# Patient Record
Sex: Female | Born: 1945 | Marital: Married | Smoking: Current every day smoker
Health system: Southern US, Community
[De-identification: ages and names within clinical notes are randomized; demographics above are authoritative.]

## PROBLEM LIST (undated history)

## (undated) ENCOUNTER — Emergency Department (HOSPITAL_COMMUNITY): Disposition: A | Discharge status: Home / Self Care | Payer: Medicare Other

## (undated) DIAGNOSIS — K635 Polyp of colon: Secondary | ICD-10-CM

## (undated) DIAGNOSIS — I739 Peripheral vascular disease, unspecified: Secondary | ICD-10-CM

## (undated) DIAGNOSIS — T7840XA Allergy, unspecified, initial encounter: Secondary | ICD-10-CM

## (undated) DIAGNOSIS — R198 Other specified symptoms and signs involving the digestive system and abdomen: Secondary | ICD-10-CM

## (undated) DIAGNOSIS — K449 Diaphragmatic hernia without obstruction or gangrene: Secondary | ICD-10-CM

## (undated) DIAGNOSIS — I809 Phlebitis and thrombophlebitis of unspecified site: Secondary | ICD-10-CM

## (undated) DIAGNOSIS — J439 Emphysema, unspecified: Secondary | ICD-10-CM

## (undated) DIAGNOSIS — N189 Chronic kidney disease, unspecified: Secondary | ICD-10-CM

## (undated) DIAGNOSIS — K219 Gastro-esophageal reflux disease without esophagitis: Secondary | ICD-10-CM

## (undated) DIAGNOSIS — G629 Polyneuropathy, unspecified: Secondary | ICD-10-CM

## (undated) DIAGNOSIS — M1990 Unspecified osteoarthritis, unspecified site: Secondary | ICD-10-CM

## (undated) DIAGNOSIS — N184 Chronic kidney disease, stage 4 (severe): Secondary | ICD-10-CM

## (undated) DIAGNOSIS — H409 Unspecified glaucoma: Secondary | ICD-10-CM

## (undated) DIAGNOSIS — F32A Depression, unspecified: Secondary | ICD-10-CM

## (undated) DIAGNOSIS — K222 Esophageal obstruction: Secondary | ICD-10-CM

## (undated) DIAGNOSIS — K5790 Diverticulosis of intestine, part unspecified, without perforation or abscess without bleeding: Secondary | ICD-10-CM

## (undated) DIAGNOSIS — I729 Aneurysm of unspecified site: Secondary | ICD-10-CM

## (undated) DIAGNOSIS — I639 Cerebral infarction, unspecified: Secondary | ICD-10-CM

## (undated) DIAGNOSIS — E785 Hyperlipidemia, unspecified: Secondary | ICD-10-CM

## (undated) DIAGNOSIS — Z8489 Family history of other specified conditions: Secondary | ICD-10-CM

## (undated) DIAGNOSIS — F419 Anxiety disorder, unspecified: Secondary | ICD-10-CM

## (undated) DIAGNOSIS — I10 Essential (primary) hypertension: Secondary | ICD-10-CM

## (undated) DIAGNOSIS — G43909 Migraine, unspecified, not intractable, without status migrainosus: Secondary | ICD-10-CM

## (undated) DIAGNOSIS — H269 Unspecified cataract: Secondary | ICD-10-CM

## (undated) DIAGNOSIS — N185 Chronic kidney disease, stage 5: Secondary | ICD-10-CM

## (undated) DIAGNOSIS — K649 Unspecified hemorrhoids: Secondary | ICD-10-CM

## (undated) HISTORY — DX: Cerebral infarction, unspecified: I63.9

## (undated) HISTORY — PX: BREAST SURGERY: SHX581

## (undated) HISTORY — DX: Allergy, unspecified, initial encounter: T78.40XA

## (undated) HISTORY — DX: Hyperlipidemia, unspecified: E78.5

## (undated) HISTORY — DX: Esophageal obstruction: K22.2

## (undated) HISTORY — PX: CHOLECYSTECTOMY: SHX55

## (undated) HISTORY — DX: Unspecified glaucoma: H40.9

## (undated) HISTORY — DX: Diverticulosis of intestine, part unspecified, without perforation or abscess without bleeding: K57.90

## (undated) HISTORY — PX: HERNIA REPAIR: SHX51

## (undated) HISTORY — DX: Phlebitis and thrombophlebitis of unspecified site: I80.9

## (undated) HISTORY — DX: Chronic kidney disease, stage 4 (severe): N18.4

## (undated) HISTORY — DX: Gastro-esophageal reflux disease without esophagitis: K21.9

## (undated) HISTORY — DX: Unspecified hemorrhoids: K64.9

## (undated) HISTORY — DX: Peripheral vascular disease, unspecified: I73.9

## (undated) HISTORY — PX: OTHER SURGICAL HISTORY: SHX169

## (undated) HISTORY — DX: Unspecified osteoarthritis, unspecified site: M19.90

## (undated) HISTORY — DX: Diaphragmatic hernia without obstruction or gangrene: K44.9

## (undated) HISTORY — DX: Unspecified cataract: H26.9

## (undated) HISTORY — PX: KNEE SURGERY: SHX244

## (undated) HISTORY — DX: Polyp of colon: K63.5

## (undated) HISTORY — DX: Essential (primary) hypertension: I10

## (undated) HISTORY — DX: Chronic kidney disease, unspecified: N18.9

## (undated) HISTORY — DX: Aneurysm of unspecified site: I72.9

## (undated) HISTORY — DX: Other specified symptoms and signs involving the digestive system and abdomen: R19.8

## (undated) HISTORY — DX: Migraine, unspecified, not intractable, without status migrainosus: G43.909

## (undated) HISTORY — DX: Depression, unspecified: F32.A

## (undated) HISTORY — PX: THYROID SURGERY: SHX805

## (undated) HISTORY — DX: Emphysema, unspecified: J43.9

## (undated) HISTORY — DX: Anxiety disorder, unspecified: F41.9

---

## 1982-09-20 HISTORY — PX: ABDOMINAL HYSTERECTOMY: SHX81

## 1998-09-20 DIAGNOSIS — I10 Essential (primary) hypertension: Secondary | ICD-10-CM

## 1998-09-20 HISTORY — DX: Essential (primary) hypertension: I10

## 1998-12-02 ENCOUNTER — Ambulatory Visit (HOSPITAL_COMMUNITY): Admission: RE | Admit: 1998-12-02 | Discharge: 1998-12-02 | Payer: Self-pay | Admitting: Family Medicine

## 2000-01-22 ENCOUNTER — Encounter: Admission: RE | Admit: 2000-01-22 | Discharge: 2000-01-22 | Payer: Self-pay | Admitting: Family Medicine

## 2000-01-22 ENCOUNTER — Encounter: Payer: Self-pay | Admitting: Family Medicine

## 2000-01-26 ENCOUNTER — Encounter: Admission: RE | Admit: 2000-01-26 | Discharge: 2000-01-26 | Payer: Self-pay | Admitting: Family Medicine

## 2000-01-26 ENCOUNTER — Encounter: Payer: Self-pay | Admitting: Family Medicine

## 2000-09-09 ENCOUNTER — Encounter: Payer: Self-pay | Admitting: Orthopedic Surgery

## 2000-09-09 ENCOUNTER — Encounter: Admission: RE | Admit: 2000-09-09 | Discharge: 2000-09-09 | Payer: Self-pay | Admitting: Orthopedic Surgery

## 2000-10-11 ENCOUNTER — Encounter: Admission: RE | Admit: 2000-10-11 | Discharge: 2001-01-09 | Payer: Self-pay | Admitting: Orthopedic Surgery

## 2001-01-12 ENCOUNTER — Encounter: Admission: RE | Admit: 2001-01-12 | Discharge: 2001-01-12 | Payer: Self-pay | Admitting: Orthopedic Surgery

## 2001-09-20 DIAGNOSIS — I729 Aneurysm of unspecified site: Secondary | ICD-10-CM

## 2001-09-20 HISTORY — DX: Aneurysm of unspecified site: I72.9

## 2002-08-09 ENCOUNTER — Encounter (HOSPITAL_BASED_OUTPATIENT_CLINIC_OR_DEPARTMENT_OTHER): Payer: Self-pay | Admitting: General Surgery

## 2002-08-10 ENCOUNTER — Ambulatory Visit (HOSPITAL_COMMUNITY): Admission: RE | Admit: 2002-08-10 | Discharge: 2002-08-10 | Payer: Self-pay | Admitting: General Surgery

## 2002-08-10 ENCOUNTER — Encounter (INDEPENDENT_AMBULATORY_CARE_PROVIDER_SITE_OTHER): Payer: Self-pay | Admitting: Specialist

## 2002-08-10 ENCOUNTER — Encounter (INDEPENDENT_AMBULATORY_CARE_PROVIDER_SITE_OTHER): Payer: Self-pay | Admitting: *Deleted

## 2003-07-30 ENCOUNTER — Encounter: Payer: Self-pay | Admitting: Internal Medicine

## 2003-08-19 ENCOUNTER — Encounter: Payer: Self-pay | Admitting: Internal Medicine

## 2003-10-23 ENCOUNTER — Emergency Department (HOSPITAL_COMMUNITY): Admission: EM | Admit: 2003-10-23 | Discharge: 2003-10-23 | Payer: Self-pay | Admitting: Emergency Medicine

## 2004-01-19 ENCOUNTER — Encounter: Admission: RE | Admit: 2004-01-19 | Discharge: 2004-01-19 | Payer: Self-pay | Admitting: Orthopedic Surgery

## 2004-01-23 ENCOUNTER — Encounter: Admission: RE | Admit: 2004-01-23 | Discharge: 2004-01-23 | Payer: Self-pay | Admitting: Endocrinology

## 2004-01-28 ENCOUNTER — Encounter: Admission: RE | Admit: 2004-01-28 | Discharge: 2004-01-28 | Payer: Self-pay | Admitting: Endocrinology

## 2004-03-04 ENCOUNTER — Ambulatory Visit (HOSPITAL_COMMUNITY): Admission: RE | Admit: 2004-03-04 | Discharge: 2004-03-04 | Payer: Self-pay | Admitting: Neurology

## 2004-04-20 ENCOUNTER — Ambulatory Visit (HOSPITAL_COMMUNITY): Admission: RE | Admit: 2004-04-20 | Discharge: 2004-04-20 | Payer: Self-pay | Admitting: Endocrinology

## 2005-04-01 ENCOUNTER — Ambulatory Visit: Payer: Self-pay | Admitting: Endocrinology

## 2005-04-07 ENCOUNTER — Ambulatory Visit: Payer: Self-pay | Admitting: Endocrinology

## 2005-07-01 ENCOUNTER — Ambulatory Visit: Payer: Self-pay | Admitting: Endocrinology

## 2005-07-07 ENCOUNTER — Ambulatory Visit: Payer: Self-pay | Admitting: Endocrinology

## 2005-07-21 ENCOUNTER — Ambulatory Visit: Payer: Self-pay | Admitting: Endocrinology

## 2005-09-21 LAB — CONVERTED CEMR LAB: Pap Smear: NORMAL

## 2005-11-29 ENCOUNTER — Ambulatory Visit: Payer: Self-pay | Admitting: Endocrinology

## 2006-04-01 ENCOUNTER — Ambulatory Visit: Payer: Self-pay | Admitting: Endocrinology

## 2006-04-07 ENCOUNTER — Ambulatory Visit: Payer: Self-pay | Admitting: Endocrinology

## 2006-09-19 ENCOUNTER — Emergency Department (HOSPITAL_COMMUNITY): Admission: EM | Admit: 2006-09-19 | Discharge: 2006-09-19 | Payer: Self-pay | Admitting: Emergency Medicine

## 2006-09-21 ENCOUNTER — Ambulatory Visit: Payer: Self-pay | Admitting: Endocrinology

## 2006-10-26 ENCOUNTER — Ambulatory Visit: Payer: Self-pay | Admitting: Endocrinology

## 2006-10-26 LAB — CONVERTED CEMR LAB
Albumin: 3.6 g/dL (ref 3.5–5.2)
Alkaline Phosphatase: 53 units/L (ref 39–117)
Hgb A1c MFr Bld: 8.4 % — ABNORMAL HIGH (ref 4.6–6.0)
Total CHOL/HDL Ratio: 2.9
Triglycerides: 94 mg/dL (ref 0–149)
VLDL: 19 mg/dL (ref 0–40)

## 2006-10-28 ENCOUNTER — Ambulatory Visit: Payer: Self-pay

## 2006-11-01 ENCOUNTER — Ambulatory Visit: Payer: Self-pay | Admitting: Endocrinology

## 2006-11-05 ENCOUNTER — Ambulatory Visit: Payer: Self-pay | Admitting: Family Medicine

## 2006-11-11 ENCOUNTER — Encounter: Admission: RE | Admit: 2006-11-11 | Discharge: 2006-11-11 | Payer: Self-pay | Admitting: Endocrinology

## 2006-11-30 ENCOUNTER — Ambulatory Visit: Payer: Self-pay | Admitting: Endocrinology

## 2006-11-30 LAB — CONVERTED CEMR LAB: TSH: 1.21 microintl units/mL (ref 0.35–5.50)

## 2006-12-21 ENCOUNTER — Ambulatory Visit: Payer: Self-pay | Admitting: Endocrinology

## 2006-12-23 ENCOUNTER — Encounter: Admission: RE | Admit: 2006-12-23 | Discharge: 2006-12-23 | Payer: Self-pay | Admitting: Obstetrics and Gynecology

## 2007-01-13 ENCOUNTER — Encounter (INDEPENDENT_AMBULATORY_CARE_PROVIDER_SITE_OTHER): Payer: Self-pay | Admitting: *Deleted

## 2007-01-13 ENCOUNTER — Encounter (INDEPENDENT_AMBULATORY_CARE_PROVIDER_SITE_OTHER): Payer: Self-pay | Admitting: Specialist

## 2007-01-13 ENCOUNTER — Inpatient Hospital Stay (HOSPITAL_COMMUNITY): Admission: RE | Admit: 2007-01-13 | Discharge: 2007-01-16 | Payer: Self-pay | Admitting: Obstetrics and Gynecology

## 2007-03-30 ENCOUNTER — Ambulatory Visit: Payer: Self-pay | Admitting: Endocrinology

## 2007-04-04 ENCOUNTER — Ambulatory Visit: Payer: Self-pay | Admitting: Endocrinology

## 2007-04-19 ENCOUNTER — Encounter: Admission: RE | Admit: 2007-04-19 | Discharge: 2007-06-20 | Payer: Self-pay | Admitting: Endocrinology

## 2007-05-01 ENCOUNTER — Encounter: Payer: Self-pay | Admitting: Endocrinology

## 2007-05-01 DIAGNOSIS — E1122 Type 2 diabetes mellitus with diabetic chronic kidney disease: Secondary | ICD-10-CM | POA: Insufficient documentation

## 2007-05-01 DIAGNOSIS — I10 Essential (primary) hypertension: Secondary | ICD-10-CM | POA: Insufficient documentation

## 2007-05-01 DIAGNOSIS — N183 Chronic kidney disease, stage 3 (moderate): Secondary | ICD-10-CM

## 2007-05-02 ENCOUNTER — Ambulatory Visit: Payer: Self-pay | Admitting: Endocrinology

## 2007-05-16 ENCOUNTER — Ambulatory Visit: Payer: Self-pay | Admitting: Endocrinology

## 2007-07-27 ENCOUNTER — Ambulatory Visit: Payer: Self-pay | Admitting: Endocrinology

## 2007-07-27 LAB — CONVERTED CEMR LAB: Hgb A1c MFr Bld: 8.3 % — ABNORMAL HIGH (ref 4.6–6.0)

## 2007-08-02 ENCOUNTER — Ambulatory Visit: Payer: Self-pay | Admitting: Endocrinology

## 2007-08-02 ENCOUNTER — Telehealth (INDEPENDENT_AMBULATORY_CARE_PROVIDER_SITE_OTHER): Payer: Self-pay | Admitting: *Deleted

## 2007-09-05 ENCOUNTER — Encounter: Payer: Self-pay | Admitting: Endocrinology

## 2007-09-18 ENCOUNTER — Ambulatory Visit: Payer: Self-pay | Admitting: Endocrinology

## 2007-10-16 ENCOUNTER — Telehealth (INDEPENDENT_AMBULATORY_CARE_PROVIDER_SITE_OTHER): Payer: Self-pay | Admitting: *Deleted

## 2007-10-17 ENCOUNTER — Ambulatory Visit: Payer: Self-pay | Admitting: Internal Medicine

## 2007-11-17 ENCOUNTER — Ambulatory Visit: Payer: Self-pay | Admitting: Endocrinology

## 2007-11-17 ENCOUNTER — Telehealth: Payer: Self-pay | Admitting: Endocrinology

## 2008-01-04 ENCOUNTER — Ambulatory Visit: Payer: Self-pay | Admitting: Endocrinology

## 2008-01-04 LAB — CONVERTED CEMR LAB
BUN: 17 mg/dL (ref 6–23)
CO2: 32 meq/L (ref 19–32)
Calcium: 9.5 mg/dL (ref 8.4–10.5)
Chloride: 103 meq/L (ref 96–112)
Cholesterol: 191 mg/dL (ref 0–200)
Creatinine, Ser: 1.1 mg/dL (ref 0.4–1.2)
Glucose, Bld: 286 mg/dL — ABNORMAL HIGH (ref 70–99)
HDL: 32.2 mg/dL — ABNORMAL LOW (ref >39.0)
Hgb A1c MFr Bld: 9.4 % — ABNORMAL HIGH (ref 4.6–6.0)
Triglycerides: 140 mg/dL (ref 0–149)

## 2008-01-09 ENCOUNTER — Ambulatory Visit: Payer: Self-pay | Admitting: Endocrinology

## 2008-01-15 ENCOUNTER — Encounter: Admission: RE | Admit: 2008-01-15 | Discharge: 2008-01-15 | Payer: Self-pay | Admitting: Neurology

## 2008-01-24 ENCOUNTER — Ambulatory Visit (HOSPITAL_BASED_OUTPATIENT_CLINIC_OR_DEPARTMENT_OTHER): Admission: RE | Admit: 2008-01-24 | Discharge: 2008-01-24 | Payer: Self-pay | Admitting: Neurology

## 2008-01-27 ENCOUNTER — Ambulatory Visit: Payer: Self-pay | Admitting: Internal Medicine

## 2008-02-07 ENCOUNTER — Ambulatory Visit: Payer: Self-pay | Admitting: Endocrinology

## 2008-03-07 ENCOUNTER — Encounter: Payer: Self-pay | Admitting: Endocrinology

## 2008-03-11 ENCOUNTER — Ambulatory Visit: Payer: Self-pay | Admitting: Endocrinology

## 2008-03-11 DIAGNOSIS — H409 Unspecified glaucoma: Secondary | ICD-10-CM

## 2008-03-11 DIAGNOSIS — I671 Cerebral aneurysm, nonruptured: Secondary | ICD-10-CM | POA: Insufficient documentation

## 2008-03-11 DIAGNOSIS — E1139 Type 2 diabetes mellitus with other diabetic ophthalmic complication: Secondary | ICD-10-CM | POA: Insufficient documentation

## 2008-03-11 DIAGNOSIS — E042 Nontoxic multinodular goiter: Secondary | ICD-10-CM | POA: Insufficient documentation

## 2008-03-15 ENCOUNTER — Encounter: Payer: Self-pay | Admitting: Endocrinology

## 2008-05-06 ENCOUNTER — Telehealth (INDEPENDENT_AMBULATORY_CARE_PROVIDER_SITE_OTHER): Payer: Self-pay | Admitting: *Deleted

## 2008-06-21 ENCOUNTER — Telehealth: Payer: Self-pay | Admitting: Internal Medicine

## 2008-07-03 ENCOUNTER — Ambulatory Visit: Payer: Self-pay | Admitting: Endocrinology

## 2008-07-03 LAB — CONVERTED CEMR LAB
Alkaline Phosphatase: 69 units/L (ref 39–117)
Bilirubin Urine: NEGATIVE
Bilirubin, Direct: 0.1 mg/dL (ref 0.0–0.3)
Eosinophils Absolute: 0.2 10*3/uL (ref 0.0–0.7)
GFR calc Af Amer: 65 mL/min
GFR calc non Af Amer: 54 mL/min
Glucose, Bld: 206 mg/dL — ABNORMAL HIGH (ref 70–99)
HCT: 40.5 % (ref 36.0–46.0)
HDL: 32.3 mg/dL — ABNORMAL LOW (ref >39.0)
Hemoglobin, Urine: NEGATIVE
Hgb A1c MFr Bld: 8.1 % — ABNORMAL HIGH (ref 4.6–6.0)
LDL Cholesterol: 129 mg/dL — ABNORMAL HIGH (ref 0–99)
Microalb Creat Ratio: 8.8 mg/g (ref 0.0–30.0)
Monocytes Absolute: 0.5 10*3/uL (ref 0.1–1.0)
Monocytes Relative: 5.3 % (ref 3.0–12.0)
Neutrophils Relative %: 66.6 % (ref 43.0–77.0)
Nitrite: NEGATIVE
Platelets: 310 10*3/uL (ref 150–400)
Potassium: 4 meq/L (ref 3.5–5.1)
RDW: 11.7 % (ref 11.5–14.6)
Sodium: 141 meq/L (ref 135–145)
TSH: 1.72 microintl units/mL (ref 0.35–5.50)
Total CHOL/HDL Ratio: 5.8
Total Protein, Urine: NEGATIVE mg/dL
Urobilinogen, UA: 0.2 (ref 0.0–1.0)
VLDL: 27 mg/dL (ref 0–40)

## 2008-07-08 ENCOUNTER — Ambulatory Visit: Payer: Self-pay | Admitting: Endocrinology

## 2008-08-22 ENCOUNTER — Encounter: Payer: Self-pay | Admitting: Endocrinology

## 2008-08-23 ENCOUNTER — Telehealth (INDEPENDENT_AMBULATORY_CARE_PROVIDER_SITE_OTHER): Payer: Self-pay | Admitting: *Deleted

## 2008-08-26 ENCOUNTER — Encounter: Admission: RE | Admit: 2008-08-26 | Discharge: 2008-08-26 | Payer: Self-pay | Admitting: Surgery

## 2008-08-27 ENCOUNTER — Encounter: Payer: Self-pay | Admitting: Internal Medicine

## 2008-08-27 DIAGNOSIS — K222 Esophageal obstruction: Secondary | ICD-10-CM | POA: Insufficient documentation

## 2008-08-27 DIAGNOSIS — K219 Gastro-esophageal reflux disease without esophagitis: Secondary | ICD-10-CM | POA: Insufficient documentation

## 2008-08-27 HISTORY — DX: Esophageal obstruction: K22.2

## 2008-09-09 ENCOUNTER — Ambulatory Visit (HOSPITAL_COMMUNITY): Admission: RE | Admit: 2008-09-09 | Discharge: 2008-09-09 | Payer: Self-pay | Admitting: Internal Medicine

## 2008-09-27 ENCOUNTER — Encounter: Payer: Self-pay | Admitting: Endocrinology

## 2008-10-14 ENCOUNTER — Ambulatory Visit: Payer: Self-pay | Admitting: Endocrinology

## 2008-10-14 LAB — CONVERTED CEMR LAB
AST: 25 units/L (ref 0–37)
Albumin: 3.5 g/dL (ref 3.5–5.2)
Alkaline Phosphatase: 84 units/L (ref 39–117)
Cholesterol: 197 mg/dL (ref 0–200)
Total Protein: 7.4 g/dL (ref 6.0–8.3)
Triglycerides: 184 mg/dL — ABNORMAL HIGH (ref 0–149)

## 2008-11-08 ENCOUNTER — Ambulatory Visit (HOSPITAL_COMMUNITY): Admission: RE | Admit: 2008-11-08 | Discharge: 2008-11-09 | Payer: Self-pay | Admitting: Surgery

## 2008-11-28 ENCOUNTER — Encounter: Payer: Self-pay | Admitting: Endocrinology

## 2008-11-28 ENCOUNTER — Telehealth (INDEPENDENT_AMBULATORY_CARE_PROVIDER_SITE_OTHER): Payer: Self-pay | Admitting: *Deleted

## 2008-12-27 ENCOUNTER — Ambulatory Visit: Payer: Self-pay | Admitting: Endocrinology

## 2008-12-28 LAB — CONVERTED CEMR LAB
ALT: 16 units/L (ref 0–35)
AST: 20 units/L (ref 0–37)
Albumin: 3.5 g/dL (ref 3.5–5.2)
Alkaline Phosphatase: 88 units/L (ref 39–117)
BUN: 16 mg/dL (ref 6–23)
Basophils Relative: 0.4 % (ref 0.0–3.0)
Bilirubin Urine: NEGATIVE
Chloride: 103 meq/L (ref 96–112)
Cholesterol: 146 mg/dL (ref 0–200)
Creatinine, Ser: 1 mg/dL (ref 0.4–1.2)
Eosinophils Absolute: 0.2 10*3/uL (ref 0.0–0.7)
GFR calc non Af Amer: 72.21 mL/min (ref >60)
Hemoglobin, Urine: NEGATIVE
Hemoglobin: 14.1 g/dL (ref 12.0–15.0)
Ketones, ur: NEGATIVE mg/dL
LDL Cholesterol: 93 mg/dL (ref 0–99)
Lymphocytes Relative: 28.7 % (ref 12.0–46.0)
MCHC: 33.9 g/dL (ref 30.0–36.0)
Neutro Abs: 5.5 10*3/uL (ref 1.4–7.7)
RBC: 4.35 M/uL (ref 3.87–5.11)
Total Protein: 7 g/dL (ref 6.0–8.3)
Urine Glucose: NEGATIVE mg/dL
Urobilinogen, UA: 0.2 (ref 0.0–1.0)

## 2008-12-31 ENCOUNTER — Ambulatory Visit: Payer: Self-pay | Admitting: Endocrinology

## 2009-01-09 ENCOUNTER — Encounter: Payer: Self-pay | Admitting: Endocrinology

## 2009-01-09 ENCOUNTER — Ambulatory Visit: Payer: Self-pay | Admitting: Internal Medicine

## 2009-01-20 ENCOUNTER — Encounter: Admission: RE | Admit: 2009-01-20 | Discharge: 2009-01-20 | Payer: Self-pay | Admitting: Neurology

## 2009-02-13 ENCOUNTER — Encounter: Admission: RE | Admit: 2009-02-13 | Discharge: 2009-02-13 | Payer: Self-pay | Admitting: Neurology

## 2009-02-18 ENCOUNTER — Encounter: Payer: Self-pay | Admitting: Endocrinology

## 2009-02-19 ENCOUNTER — Ambulatory Visit: Payer: Self-pay | Admitting: Cardiology

## 2009-02-19 ENCOUNTER — Encounter (INDEPENDENT_AMBULATORY_CARE_PROVIDER_SITE_OTHER): Payer: Self-pay | Admitting: Neurology

## 2009-02-19 ENCOUNTER — Ambulatory Visit (HOSPITAL_COMMUNITY): Admission: RE | Admit: 2009-02-19 | Discharge: 2009-02-19 | Payer: Self-pay | Admitting: Neurology

## 2009-03-06 ENCOUNTER — Encounter: Payer: Self-pay | Admitting: Endocrinology

## 2009-03-25 ENCOUNTER — Telehealth (INDEPENDENT_AMBULATORY_CARE_PROVIDER_SITE_OTHER): Payer: Self-pay | Admitting: *Deleted

## 2009-05-01 ENCOUNTER — Ambulatory Visit: Payer: Self-pay | Admitting: Endocrinology

## 2009-05-01 LAB — CONVERTED CEMR LAB
Hgb A1c MFr Bld: 9.5 % — ABNORMAL HIGH (ref 4.6–6.5)
Sed Rate: 12 mm/hr (ref 0–22)
Total CK: 144 units/L (ref 7–177)

## 2009-05-09 ENCOUNTER — Encounter: Payer: Self-pay | Admitting: Endocrinology

## 2009-06-18 ENCOUNTER — Ambulatory Visit: Payer: Self-pay | Admitting: Endocrinology

## 2009-07-30 ENCOUNTER — Telehealth: Payer: Self-pay | Admitting: Endocrinology

## 2009-10-31 ENCOUNTER — Ambulatory Visit: Payer: Self-pay | Admitting: Endocrinology

## 2009-11-14 ENCOUNTER — Encounter: Payer: Self-pay | Admitting: Endocrinology

## 2009-12-12 ENCOUNTER — Encounter: Payer: Self-pay | Admitting: Endocrinology

## 2010-01-16 ENCOUNTER — Encounter: Admission: RE | Admit: 2010-01-16 | Discharge: 2010-01-16 | Payer: Self-pay | Admitting: Neurology

## 2010-01-30 ENCOUNTER — Ambulatory Visit: Payer: Self-pay | Admitting: Endocrinology

## 2010-01-30 LAB — CONVERTED CEMR LAB
ALT: 57 units/L — ABNORMAL HIGH (ref 0–35)
Albumin: 3.9 g/dL (ref 3.5–5.2)
Alkaline Phosphatase: 95 units/L (ref 39–117)
Basophils Relative: 0.8 % (ref 0.0–3.0)
Bilirubin, Direct: 0.1 mg/dL (ref 0.0–0.3)
CO2: 31 meq/L (ref 19–32)
Calcium: 9.8 mg/dL (ref 8.4–10.5)
Chloride: 100 meq/L (ref 96–112)
Direct LDL: 146.4 mg/dL
Eosinophils Absolute: 0.1 10*3/uL (ref 0.0–0.7)
Eosinophils Relative: 1.1 % (ref 0.0–5.0)
Hemoglobin, Urine: NEGATIVE
Hemoglobin: 14.3 g/dL (ref 12.0–15.0)
Iron: 65 ug/dL (ref 42–145)
Lymphocytes Relative: 30.4 % (ref 12.0–46.0)
MCHC: 34.7 g/dL (ref 30.0–36.0)
MCV: 95 fL (ref 78.0–100.0)
Microalb, Ur: 7.6 mg/dL — ABNORMAL HIGH (ref 0.0–1.9)
Neutro Abs: 6 10*3/uL (ref 1.4–7.7)
Nitrite: NEGATIVE
RBC: 4.35 M/uL (ref 3.87–5.11)
Saturation Ratios: 12.8 % — ABNORMAL LOW (ref 20.0–50.0)
Sodium: 140 meq/L (ref 135–145)
Specific Gravity, Urine: 1.02 (ref 1.000–1.030)
Total CHOL/HDL Ratio: 5
Total Protein, Urine: NEGATIVE mg/dL
Total Protein: 7.5 g/dL (ref 6.0–8.3)
Transferrin: 361.7 mg/dL — ABNORMAL HIGH (ref 212.0–360.0)
Urine Glucose: 500 mg/dL
VLDL: 37.4 mg/dL (ref 0.0–40.0)
WBC: 9.7 10*3/uL (ref 4.5–10.5)
pH: 5 (ref 5.0–8.0)

## 2010-02-09 ENCOUNTER — Telehealth: Payer: Self-pay | Admitting: Internal Medicine

## 2010-02-13 ENCOUNTER — Encounter: Payer: Self-pay | Admitting: Endocrinology

## 2010-03-03 ENCOUNTER — Encounter: Payer: Self-pay | Admitting: Endocrinology

## 2010-03-05 ENCOUNTER — Encounter: Payer: Self-pay | Admitting: Endocrinology

## 2010-03-27 ENCOUNTER — Ambulatory Visit: Payer: Self-pay | Admitting: Endocrinology

## 2010-04-27 ENCOUNTER — Encounter: Admission: RE | Admit: 2010-04-27 | Discharge: 2010-04-27 | Payer: Self-pay | Admitting: Obstetrics and Gynecology

## 2010-04-28 ENCOUNTER — Encounter: Payer: Self-pay | Admitting: Internal Medicine

## 2010-05-01 ENCOUNTER — Encounter: Payer: Self-pay | Admitting: Endocrinology

## 2010-06-09 ENCOUNTER — Ambulatory Visit: Payer: Self-pay | Admitting: Internal Medicine

## 2010-06-09 DIAGNOSIS — Z86010 Personal history of colonic polyps: Secondary | ICD-10-CM | POA: Insufficient documentation

## 2010-06-09 DIAGNOSIS — K5730 Diverticulosis of large intestine without perforation or abscess without bleeding: Secondary | ICD-10-CM | POA: Insufficient documentation

## 2010-07-21 DIAGNOSIS — R198 Other specified symptoms and signs involving the digestive system and abdomen: Secondary | ICD-10-CM

## 2010-07-21 HISTORY — PX: ESOPHAGOGASTRODUODENOSCOPY: SHX1529

## 2010-07-21 HISTORY — DX: Other specified symptoms and signs involving the digestive system and abdomen: R19.8

## 2010-07-21 HISTORY — PX: COLONOSCOPY: SHX174

## 2010-07-31 ENCOUNTER — Ambulatory Visit: Payer: Self-pay | Admitting: Internal Medicine

## 2010-10-20 NOTE — Op Note (Signed)
Summary: Operative Report-Hernia Repair    NAME:  [name], [name]                          ACCOUNT NO.:  [id number]   MEDICAL RECORD NO.:  [id number]                   PATIENT TYPE:  OIB   LOCATION:  NA                                   FACILITY:  [facility]   PHYSICIAN:  Kathrin Penner, M.D.                DATE OF BIRTH:  [birth year]   DATE OF PROCEDURE:  08/10/2002  DATE OF DISCHARGE:                                 OPERATIVE REPORT   PREOPERATIVE DIAGNOSIS:  Ventral hernia.   POSTOPERATIVE DIAGNOSIS:  Ventral hernia.   OPERATION PERFORMED:  Repair of ventral hernia.   SURGEON:  Kathrin Penner, M.D.   ASSISTANT:  Sharyn Dross, RNFA   ANESTHESIA:  General.   INDICATIONS FOR PROCEDURE:  The patient is a 65 year old woman presenting  with abdominal pain and a mass in the infra and supraumbilical region.  She  has had previous C-sections in the past.  She also complains of some pain  going down her whole classic C-section scar.  She is brought to the  operating room now after the risks and potential benefits of surgery have  been fully discussed, all questions answered and consent obtained.   DESCRIPTION OF PROCEDURE:  Following the induction of satisfactory general  anesthesia with the patient positioned supinely.  The abdomen was doubly  prepped and draped to be included in a sterile operative field.  A midline  incision was carried down from just above the umbilicus, skirting the  umbilicus and carried down to the old midline incision in the lower abdomen.  This was deepened down through the skin and subcutaneous tissue carrying the  dissection down to the hernia sac.  The hernia sac was dissected free from  the subumbilical space and the region of the falciform ligament included  within the hernia sac was then clamped and secured with ties of 2-0 silk.  The hernial defect was then explored.  There were no adhesions to the  hernial defect and exploration along the anterior  midline wound did not note  any adhesions to the anterior abdominal wall and there were no adhesions in  the right lower quadrant or any other abnormality noted within the abdomen.  The hernial defect was repaired with a large mesh plug placed over a  Seprafilm slurry which was placed into the abdomen so as to prevent the mesh  from sticking to the viscera.  The mesh was then sewn in with interrupted 0  Novofil sutures completing the repair.  Sponge, instrument and sharp counts  were then verified.  The repair was noted to be intact.  Subcutaneous  tissues were closed with a running 2-0 Vicryl suture and the skin closed  with a running 4-0 Monocryl suture.  The wound was infiltrated with 0.5%  Marcaine with epinephrine.  The wound was then reinforced with Steri-Strips.  Sterile dressings were applied.  Anesthetic was  reversed and the patient  removed from the operating room to the recovery room in stable condition,  having tolerated the procedure well.                                                 Kathrin Penner, M.D.   PB/MEDQ  D:  08/10/2002  T:  08/10/2002  Job:  [id number]

## 2010-10-20 NOTE — Consult Note (Signed)
Summary: [facility]  [facility]   Imported By: Edmonia James 07/22/2010 10:02:41  _____________________________________________________________________  External Attachment:    Type:   Image     Comment:   External Document

## 2010-10-20 NOTE — Procedures (Signed)
Summary: EGD/[facility]  EGD/[facility]   Imported By: Edmonia James 07/22/2010 10:08:50  _____________________________________________________________________  External Attachment:    Type:   Image     Comment:   External Document

## 2010-10-20 NOTE — Letter (Signed)
Summary: [facility]   Imported By: Bubba Hales 03/12/2010 10:05:59  _____________________________________________________________________  External Attachment:    Type:   Image     Comment:   External Document

## 2010-10-20 NOTE — Assessment & Plan Note (Signed)
Summary: 6 mo rov /nws $50   Vital Signs:  Patient profile:   65 year old female Height:      65 inches (165.10 cm) Weight:      182.13 pounds (82.79 kg) O2 Sat:      94 % on Room air Temp:     97.2 degrees F (36.22 degrees C) oral Pulse rate:   77 / minute BP sitting:   124 / 74  (left arm) Cuff size:   large  Vitals Entered By: Gardenia Phlegm CMA (October 31, 2009 10:05 AM)  O2 Flow:  Room air CC: 6 month follow up/ pt states she is not taking the 81mg  Aspirin/ CF Is Patient Diabetic? Yes   Referring Provider:  elaine brown, dds  CC:  6 month follow up/ pt states she is not taking the 81mg  Aspirin/ CF.  History of Present Illness: no cbg record, but states cbg's are well-controlled, except for "high later in the day." pt states no change in her diffuse myalgias and arthralgias.   pt states no change in her chronic dry cough.  Current Medications (verified): 1)  Omeprazole 20 Mg  Cpdr (Omeprazole) .... Take 1 By Mouth Qd 2)  Adult Aspirin Low Strength 81 Mg  Tbdp (Aspirin) .... Take 1 By Mouth Qd 3)  Zocor 40 Mg  Tabs (Simvastatin) .Marland Kitchen.. 1 By Mouth Qd 4)  Novolog Mix 70/30 Flexpen 70-30 %  Susp (Insulin Aspart Prot & Aspart) .... 35 Units Am and 10 Units Pm 5)  Glucophage Xr 500 Mg  Tb24 (Metformin Hcl) .... Take 2 By Mouth Two Times A Day Qd 6)  Alprazolam 0.25 Mg  Tbdp (Alprazolam) .... Take 1 By Mouth Once Daily As Needed Nerves--Dr Lewitt 7)  Aspirin 325 Mg  Tabs (Aspirin) .... Take 1 By Mouth Qd 8)  Clonazepam Odt 0.5 Mg  Tbdp (Clonazepam) .... Take 1 By Mouth At Bedtime--Dr Lewitt 9)  Citalopram Hydrobromide 20 Mg  Tabs (Citalopram Hydrobromide) .... Take 2 By Mouth Q Am 10)  Dilt-Cd 120 Mg  Xr24h-Cap (Diltiazem Hcl Coated Beads) .... Take 1 By Mouth Qd 11)  Bd U/f Short Pen Needle 31g X 8 Mm Misc (Insulin Pen Needle) .... Use 1 Two Times A Day 12)  Travatan 0.004 % Soln (Travoprost) .... Use 1 Drop At Bedtime 13)  Meclizine Hcl 12.5 Mg Tabs (Meclizine Hcl) .Marland Kitchen.. 1-2  Q6h As Needed Dizziness 14)  Freestyle Lite Test  Strp (Glucose Blood) .... Check Blood Sugar Two Times A Day 15)  Hyzaar 100-25 Mg Tabs (Losartan Potassium-Hctz) .Marland Kitchen.. 1 Qd 16)  Benicar Hct 40-25 Mg Tabs (Olmesartan Medoxomil-Hctz) .Marland Kitchen.. 1 Tab Daily 17)  Diclofenac Sodium 50 Mg Tbec (Diclofenac Sodium) .Marland Kitchen.. 1 Tab Two Times A Day  Allergies (verified): 1)  ! Penicillin 2)  ! * Ivp Dye 3)  Penicillin V Potassium (Penicillin V Potassium)  Past History:  Past Medical History: Last updated: 03/11/2008 Smoker Glaucoma DM Retinopathy Small Mulitinodular Goiter CEREBRAL ANEURYSM (ICD-437.3) ANXIETY STATE, UNSPECIFIED (ICD-300.00) COUGH (ICD-786.2) NEED PROPHYLACTIC VACCINATION&INOCULATION FLU (ICD-V04.81) HYPERLIPIDEMIA (ICD-272.4) HYPERTENSION (ICD-401.9) DIZZINESS OR VERTIGO (ICD-780.4) DIABETES MELLITUS, TYPE II (ICD-250.00)  Review of Systems  The patient denies hypoglycemia and fever.    Physical Exam  General:  normal appearance.   Lungs:  Clear to auscultation bilaterally. Normal respiratory effort.  Psych:  depressed affect.     Impression & Recommendations:  Problem # 1:  DIABETES MELLITUS, TYPE II (ICD-250.00) needs increased rx  Problem # 2:  MYALGIA (ICD-729.1)  uncertain etiology  Problem # 3:  COUGH (123456) uncertain etiology.  Medications Added to Medication List This Visit: 1)  Diclofenac Sodium 50 Mg Tbec (Diclofenac sodium) .Marland Kitchen.. 1 tab two times a day 2)  Novolin 70/30 70-30 % Susp (Insulin isophane & regular) .... 35 units am and 10 units pm 3)  Novolin 70/30 70-30 % Susp (Insulin isophane & regular) .... 40 units am and 10 units pm 4)  Bd Insulin Syringe 31g X 5/16" 0.3 Ml Misc (Insulin syringe-needle u-100) .... Two times a day  Other Orders: Rheumatology Referral (Rheumatology) TLB-A1C / Hgb A1C (Glycohemoglobin) (83036-A1C) Est. Patient Level IV [id number])  Patient Instructions: 1)  check your blood sugar 2 times a day.  vary the time of day  when you check, between before the 3 meals, and at bedtime.  also check if you have symptoms of your blood sugar being too high or too low.  please keep a record of the readings and bring it to your next appointment here.  please call us sooner if you are having low blood sugar episodes. 2)  Please schedue a physical in 3 months. 3)  change insulin to novolin 70/30 35 units am and 10 units pm.   4)  refer rheumatology. 5)  try loratadine-d as needed for cough. 6)  tests are being ordered for you today.  a few days after the test(s), please call [phone] to hear your test results. 7)  here are some samples of benicar-hct 40/25, to take once daily instead of hyzaar. Prescriptions: BD INSULIN SYRINGE 31G X 5/16" 0.3 ML MISC (INSULIN SYRINGE-NEEDLE U-100) two times a day  #100 x 11   Entered and Authorized by:   Donavan Foil MD   Signed by:   Donavan Foil MD on 10/31/2009   Method used:   Electronically to        [facility] [phone]* (retail)       [place], [place]  [id number]       Ph: [phone] or [phone]       Fax: [phone]   RxID:   [id number] NOVOLIN 70/30 70-30 % SUSP (INSULIN ISOPHANE & REGULAR) 35 units am and 10 units pm  #2 vials x 11   Entered and Authorized by:   Donavan Foil MD   Signed by:   Donavan Foil MD on 10/31/2009   Method used:   Electronically to        [facility] [phone]* (retail)       [place], [place]  [id number]       Ph: [phone] or [phone]       Fax: [phone]   RxID:   [id number]

## 2010-10-20 NOTE — Consult Note (Signed)
Summary: [facility]   Imported By: Bubba Hales 11/25/2009 08:08:44  _____________________________________________________________________  External Attachment:    Type:   Image     Comment:   External Document

## 2010-10-20 NOTE — Letter (Signed)
Summary: [facility]  [facility]   Imported By: Bubba Hales 05/12/2010 10:37:59  _____________________________________________________________________  External Attachment:    Type:   Image     Comment:   External Document

## 2010-10-20 NOTE — Consult Note (Signed)
Summary: [facility]   Imported By: Phillis Knack 03/10/2010 14:04:00  _____________________________________________________________________  External Attachment:    Type:   Image     Comment:   External Document

## 2010-10-20 NOTE — Op Note (Signed)
Summary: Operative Report   NAME:  [name], ESCARENO                ACCOUNT NO.:  [id number]   MEDICAL RECORD NO.:  [id number]          PATIENT TYPE:  AMB   LOCATION:  [place]                           FACILITY:  [facility]   PHYSICIAN:  Gus Height, M.D.       DATE OF BIRTH:  [birth year]   DATE OF PROCEDURE:  01/13/2007  DATE OF DISCHARGE:                               OPERATIVE REPORT   PREOPERATIVE DIAGNOSIS:  Solid right adnexal mass of right ovary.   POSTOPERATIVE DIAGNOSES:  1. Ovarian fibrothecoma.  2. Anterior abdominal wall adhesions in the area of the previous      umbilical herniorrhaphy.   PROCEDURE:  1. Exploratory laparotomy.  2. Lysis of adhesions.  3. bilateral salpingo-oophorectomy.   SURGEON:  Gus Height, M.D.   ASSISTANT:  Josefa Half, M.D.   ANESTHESIA:  General.   COMPLICATIONS:  None.   JUSTIFICATION:  The patient is a 65 year old black female, status post  previous hysterectomy, who on examination was noted have a mass in her  right adnexa.  CT scan was performed and a 7- to 8-cm solid mass was  found involving the right ovary.  No other abnormalities were noted in  the abdomen.  Because of the size and nature of this mass, the patient  is being taken to the operating at this time to undergo bilateral  salpingo-oophorectomy and to ensure that this does not represent a  malignant ovarian tumor.   PROCEDURE:  The patient was taken to the operating room and placed in a  supine position.  General anesthesia was administered without  difficulty.  She was then placed in the dorsal lithotomy position,  prepped and draped in the usual sterile fashion.  Foley catheter was  inserted.  Her previous midline incision was then incised to the level  of the umbilicus and then extended down through subcutaneous tissue.  The fascia was then identified and incised vertically.  At this point,  the peritoneum was entered and then the incision was extended under  direct visualization.   It was noted that the patient had a previous  umbilical hernia repair with mesh and that there were extensive  adhesions involving this area with the transverse colon adhered into the  herniorrhaphy site.  An adequate amount of time was taken to free the  transverse colon off the anterior abdominal wall with the colon being  adherent to the previously placed mesh.  It was possible to release the  colon without any injury to the bowel.  After this was done, the area of  mesh was excised; this now freed the abdominal wall and the surgery then  continued.  A self-retaining retractor was placed through the wound.  The ureters were packed out of pelvis.  Washings were taken for  cytology.  Inspection revealed there to be a solid, irregularly shaped  70cm tumor involving the right ovary, adherent in one area to the  lateral pelvic sidewall.  There was no evidence of any external  excrescences; external surfaces appeared to be smooth and regular.  It  was possible to free the ovary from the adhesions holding it to the  pelvic sidewall, elevate it and then with the ureter being identified,  the infundibular pelvic ligament was identified and then using 2 Kelly  clamps, the pedicle was clamped and cut and the specimen was excised and  sent for frozen section.  This pedicle was then ligated using suture  ligatures of 0 Vicryl and then a free tie of 0 Vicryl.  Hemostasis was  readily achieved.  The specimen was sent for frozen section.  The frozen  section report revealed this to be a benign fibroma or fibrothecoma;  final pathology is pending.  Once this was done, the left ovary was  inspected; it was noted be small and adherent to the lateral pelvic  sidewall.  The adhesions holding the ovary to the lateral pelvic  sidewall were freed.  This ovary was elevated and again with the ureter  being out of the field, the infundibulopelvic ligament was clamped, cut  and the specimen excised.  The pedicles  were ligated using 2 ligatures  of 0 Vicryl.  With no other abnormalities being noted, once again  careful inspection of the transverse colon was made and the colon  intact, it elected to complete the procedure.  The fascia and peritoneum  were closed using a 0 PDS suture; this began at the cephalad portion of  the incision and extended down to the pubic symphysis.  The subcutaneous  tissue was closed using interrupted 0 Vicryl sutures and then the skin  incision was closed using staples.  The estimated blood loss was  minimal, approximately 50 mL.  The patient was then reversed from the  anesthetic and taken to the recovery room in satisfactory condition  after lap counts and instrument counts were taken and found to be  correct.  The patient tolerated the procedure well.      Gus Height, M.D.  Electronically Signed     AR/MEDQ  D:  01/13/2007  T:  01/13/2007  Job:  [id number]

## 2010-10-20 NOTE — Procedures (Signed)
Summary: Colonoscopy/[facility]  Colonoscopy/[facility]   Imported By: Edmonia James 07/22/2010 10:10:00  _____________________________________________________________________  External Attachment:    Type:   Image     Comment:   External Document

## 2010-10-20 NOTE — Letter (Signed)
Summary: Request for Therapeutic Shoes/[facility]  Request for Therapeutic Shoes/[facility]   Imported By: Phillis Knack 03/10/2010 14:05:57  _____________________________________________________________________  External Attachment:    Type:   Image     Comment:   External Document

## 2010-10-20 NOTE — Progress Notes (Signed)
Summary: Rx refill req  Phone Note Refill Request Message from:  Patient  Refills Requested: Medication #1:  ONETOUCH ULTRA TEST  STRP two times a day   Dosage confirmed as above?Dosage Confirmed   Supply Requested: 3 months Initial call taken by: Crissie Sickles, CMA,  Feb 09, 2010 2:35 PM    Prescriptions: Donald Siva TEST  STRP (GLUCOSE BLOOD) two times a day, and lancets 250.00  #300 x 3   Entered by:   Crissie Sickles, CMA   Authorized by:   Biagio Borg MD   Signed by:   Crissie Sickles, CMA on 02/09/2010   Method used:   Electronically to        [facility] [phone]* (retail)       [place], [place]  [id number]       Ph: [phone] or [phone]       Fax: [phone]   RxID:   [id number]

## 2010-10-20 NOTE — Consult Note (Signed)
Summary: Periodontal Plastic Surgery/[facility]  [facility]   Imported By: Laural Benes 09/19/2007 12:18:30  _____________________________________________________________________  External Attachment:    Type:   Image     Comment:   External Document

## 2010-10-20 NOTE — Letter (Signed)
Summary: [facility]   Imported By: Phillis Knack 03/12/2010 13:26:19  _____________________________________________________________________  External Attachment:    Type:   Image     Comment:   External Document

## 2010-10-20 NOTE — Letter (Signed)
Summary: Exam for Therapeutic Shoes/[facility]  Exam for Therapeutic Shoes/ Podiatry   Imported By: Phillis Knack 03/10/2010 14:07:27  _____________________________________________________________________  External Attachment:    Type:   Image     Comment:   External Document

## 2010-10-20 NOTE — Letter (Signed)
Summary: [facility] Instructions  [facility]  [place], [place] [address]   Phone: [phone]  Fax: [phone]       CAELY MEDICO    [birth year]    MRN: [id number]        Procedure Day /Date:FRIDAY, 07/31/10     Arrival Time:7:30 AM     Procedure Time:8:00 AM     Location of Procedure:                    X  [place] (4th Floor)   [place]   Starting 5 days prior to your procedure 07/26/10 do not eat nuts, seeds, popcorn, corn, beans, peas,  salads, or any raw vegetables.  Do not take any fiber supplements (e.g. Metamucil, Citrucel, and Benefiber).  THE DAY BEFORE YOUR PROCEDURE         DATE: 07/30/10  DAY: THURSDAY  1.  Drink clear liquids the entire day-NO SOLID FOOD  2.  Do not drink anything colored red or purple.  Avoid juices with pulp.  No orange juice.  3.  Drink at least 64 oz. (8 glasses) of fluid/clear liquids during the day to prevent dehydration and help the prep work efficiently.  CLEAR LIQUIDS INCLUDE: Water Jello Ice Popsicles Tea (sugar ok, no milk/cream) Powdered fruit flavored drinks Coffee (sugar ok, no milk/cream) Gatorade Juice: apple, white grape, white cranberry  Lemonade Clear bullion, consomm, broth Carbonated beverages (any kind) Strained chicken noodle soup Hard Candy                             4.  In the morning, mix first dose of MoviPrep solution:    Empty 1 Pouch A and 1 Pouch B into the disposable container    Add lukewarm drinking water to the top line of the container. Mix to dissolve    Refrigerate (mixed solution should be used within 24 hrs)  5.  Begin drinking the prep at 5:00 p.m. The MoviPrep container is divided by 4 marks.   Every 15 minutes drink the solution down to the next mark (approximately 8 oz) until the full liter is complete.   6.  Follow completed prep with 16 oz of clear liquid of your choice (Nothing red or purple).  Continue  to drink clear liquids until bedtime.  7.  Before going to bed, mix second dose of MoviPrep solution:    Empty 1 Pouch A and 1 Pouch B into the disposable container    Add lukewarm drinking water to the top line of the container. Mix to dissolve    Refrigerate  THE DAY OF YOUR PROCEDURE      DATE: 11/11/11DAY:FRIDAY  Beginning at 3:00 a.m. (5 hours before procedure):         1. Every 15 minutes, drink the solution down to the next mark (approx 8 oz) until the full liter is complete.  2. Follow completed prep with 16 oz. of clear liquid of your choice.    3. You may drink clear liquids until 6:00 AM (2 HOURS BEFORE PROCEDURE).   MEDICATION INSTRUCTIONS  Unless otherwise instructed, you should take regular prescription medications with a small sip of water   as early as possible the morning of your procedure.  Diabetic patients - see separate instructions.       OTHER INSTRUCTIONS  You will need a responsible adult at least 64  years of age to accompany you and drive you home.   This person must remain in the waiting room during your procedure.  Wear loose fitting clothing that is easily removed.  Leave jewelry and other valuables at home.  However, you may wish to bring a book to read or  an iPod/MP3 player to listen to music as you wait for your procedure to start.  Remove all body piercing jewelry and leave at home.  Total time from sign-in until discharge is approximately 2-3 hours.  You should go home directly after your procedure and rest.  You can resume normal activities the  day after your procedure.  The day of your procedure you should not:   Drive   Make legal decisions   Operate machinery   Drink alcohol   Return to work  You will receive specific instructions about eating, activities and medications before you leave.    The above instructions have been reviewed and explained to me by   _______________________    I fully understand and can  verbalize these instructions _____________________________ Date _________

## 2010-10-20 NOTE — Procedures (Signed)
Summary: Colonoscopy  Patient: [name] Note: All result statuses are Final unless otherwise noted.  Tests: (1) Colonoscopy (COL)   COL Colonoscopy           [facility].     [place], [place]  [address]           COLONOSCOPY PROCEDURE REPORT           PATIENT:  [name], [name]  MR#:  [id number]     BIRTHDATE:  [birth year], 64 yrs. old  GENDER:  female     ENDOSCOPIST:  Docia Chuck. Geri Seminole, MD     REF. BY:  Office Self, M.D.     PROCEDURE DATE:  07/31/2010     PROCEDURE:  Surveillance Colonoscopy     ASA CLASS:  Class II     INDICATIONS:  history of polyps, surveillance and high-risk     screening ; 07-2003 w/ HPP only     MEDICATIONS:   Fentanyl 75 mcg IV, Versed 9 mg IV           DESCRIPTION OF PROCEDURE:   After the risks benefits and     alternatives of the procedure were thoroughly explained, informed     consent was obtained.  Digital rectal exam was performed and     revealed no abnormalities.   The LB 180AL [id number] endoscope was     introduced through the anus and advanced to the cecum, which was     identified by both the appendix and ileocecal valve, without     limitations.Time to cecum = 4:51 min.  The quality of the prep was     excellent, using MoviPrep.  The instrument was then slowly     withdrawn (time = 10:16 min) as the colon was fully examined.     <<PROCEDUREIMAGES>>           FINDINGS:  Severe diverticulosis, particularly in the sigmoid     region, was found throughout the colon.  This was otherwise a     normal examination of the colon.  No polyps or cancers were seen.     Retroflexed views in the rectum revealed internal hemorrhoids.     The scope was then withdrawn from the patient and the procedure     completed.           COMPLICATIONS:  None           ENDOSCOPIC IMPRESSION:     1) Severe diverticulosis throughout the colon     2) Otherwise normal examination     3) No polyps or cancers     4) Internal  hemorrhoids     RECOMMENDATIONS:     1) Continue current colorectal screening recommendations for     "routine risk" patients with a repeat colonoscopy in 10 years.     ______________________________     Docia Chuck. Geri Seminole, MD           CC:  Donavan Foil, MD;Allan Harrington Challenger, MD;The Patient           n.     Lorrin MaisDocia Chuck. Geri Seminole at 07/31/2010 08:48 AM           Meriam Sprague, [id number]  Note: An exclamation mark (!) indicates a result that was not dispersed into the flowsheet. Document Creation Date: 07/31/2010 8:48 AM _______________________________________________________________________  (1) Order result status: Final Collection or observation  date-time: 07/31/2010 08:41 Requested date-time:  Receipt date-time:  Reported date-time:  Referring Physician:   Ordering Physician: Lavena Bullion [phone]) Specimen Source:  Source: Tawanna Cooler Order Number: [id number] Lab site:   Appended Document: Colonoscopy    Clinical Lists Changes  Observations: Added new observation of COLONNXTDUE: 07/2020 (07/31/2010 13:25)

## 2010-10-20 NOTE — Letter (Signed)
Summary: New Patient letter  [facility]  [address] [place], [place] [address]   Phone: [phone]  Fax: [phone]       04/28/2010 MRN: [id number]  [name] [address] [place], [place]  [address]  Dear [name],  Welcome to the Gastroenterology Division at [facility].    You are scheduled to see Dr. Henrene Pastor on 06-09-10 at 3:30pm on the 3rd floor at [facility].  We ask that you try to arrive at our office 15 minutes prior to your appointment time to allow for check-in.  We would like you to complete the enclosed self-administered evaluation form prior to your visit and bring it with you on the day of your appointment.  We will review it with you.  Also, please bring a complete list of all your medications or, if you prefer, bring the medication bottles and we will list them.  Please bring your insurance card so that we may make a copy of it.  If your insurance requires a referral to see a specialist, please bring your referral form from your primary care physician.  Co-payments are due at the time of your visit and may be paid by cash, check or credit card.     Your office visit will consist of a consult with your physician (includes a physical exam), any laboratory testing he/she may order, scheduling of any necessary diagnostic testing (e.g. x-ray, ultrasound, CT-scan), and scheduling of a procedure (e.g. Endoscopy, Colonoscopy) if required.  Please allow enough time on your schedule to allow for any/all of these possibilities.    If you cannot keep your appointment, please call [phone] to cancel or reschedule prior to your appointment date.  This allows [place] the opportunity to schedule an appointment for another patient in need of care.  If you do not cancel or reschedule by 5 p.m. the business day prior to your appointment date, you will be charged a $50.00 late cancellation/no-show fee.    Thank you for choosing [facility] for your medical needs.  We appreciate the opportunity to care for you.  Please visit [place] at our website  to learn more about our practice.                     Sincerely,                                                             The Gastroenterology Division

## 2010-10-20 NOTE — Procedures (Signed)
Summary: Upper Endoscopy  Patient: [name] Note: All result statuses are Final unless otherwise noted.  Tests: (1) Upper Endoscopy (EGD)   EGD Upper Endoscopy       [facility].     [place], [place]  [id number]           ENDOSCOPY PROCEDURE REPORT           PATIENT:  [name], [name]  MR#:  [id number]     BIRTHDATE:  [birth year], 64 yrs. old  GENDER:  female           ENDOSCOPIST:  Docia Chuck. Geri Seminole, MD     Referred by:  Office           PROCEDURE DATE:  07/31/2010     PROCEDURE:  EGD, diagnostic [id number]     ASA CLASS:  Class II     INDICATIONS:  abdominal pain, nausea and vomiting, GERD           MEDICATIONS:   There was residual sedation effect present from     prior procedure., Fentanyl 25 mcg IV, Versed 1 mg IV     TOPICAL ANESTHETIC:  Exactacain Spray           DESCRIPTION OF PROCEDURE:   After the risks benefits and     alternatives of the procedure were thoroughly explained, informed     consent was obtained.  The LB [id number] [id number] endoscope was     introduced through the mouth and advanced to the second portion of     the duodenum, without limitations.  The instrument was slowly     withdrawn as the mucosa was fully examined.     <<PROCEDUREIMAGES>>           The upper, middle, and distal third of the esophagus were     carefully inspected and no abnormalities were noted. The z-line     was well seen at the GEJ. The endoscope was pushed into the fundus     which was normal including a retroflexed view. The antrum,gastric     body, first and second part of the duodenum were unremarkable.     Retroflexed views revealed a small hiatal hernia.    The scope was     then withdrawn from the patient and the procedure completed.           COMPLICATIONS:  None           ENDOSCOPIC IMPRESSION:     1) Normal EGD     2) A small hiatal hernia     3) GERD           RECOMMENDATIONS:     1) Anti-reflux regimen to be followed     2) Weight  loss - as we discussed previously     3) Continue omeprazole     4) Return to the care of your primary providers           ______________________________     Docia Chuck. Geri Seminole, MD           CC:  Donavan Foil, MD, Gus Height, MD, The Patient           n.     eSIGNED:   Docia Chuck. Geri Seminole at 07/31/2010 08:59 AM           [name], [id number]  Note: An exclamation mark (!) indicates a  result that was not dispersed into the flowsheet. Document Creation Date: 07/31/2010 8:59 AM _______________________________________________________________________  (1) Order result status: Final Collection or observation date-time: 07/31/2010 08:53 Requested date-time:  Receipt date-time:  Reported date-time:  Referring Physician:   Ordering Physician: Lavena Bullion [phone]) Specimen Source:  Source: Tawanna Cooler Order Number: [id number] Lab site:

## 2010-10-20 NOTE — Letter (Signed)
Summary: Screening/[facility]  Screening/[facility]   Imported By: Phillis Knack 03/10/2010 14:09:32  _____________________________________________________________________  External Attachment:    Type:   Image     Comment:   External Document

## 2010-10-20 NOTE — Letter (Signed)
Summary: Diabetic Instructions  [facility]  [place], [place] [address]   Phone: [phone]  Fax: [phone]    ROKEISHA BEITH 07/11/1946 MRN: [id number]   X   ORAL DIABETIC MEDICATION INSTRUCTIONS  The day before your procedure:   Take your diabetic pill as you do normally  The day of your procedure:   Do not take your diabetic pill    We will check your blood sugar levels during the admission process and again in Recovery before discharging you home  ________________________________________________________________________

## 2010-10-20 NOTE — Assessment & Plan Note (Signed)
Summary: [name]   Vital Signs:  Patient profile:   65 year old female Height:      65 inches (165.10 cm) Weight:      186.75 pounds (84.89 kg) BMI:     31.19 O2 Sat:      96 % on Room air Temp:     98.0 degrees F (36.67 degrees C) oral Pulse rate:   70 / minute BP sitting:   124 / 80  (left arm) Cuff size:   large  Vitals Entered By: Rebeca Alert MA (March 27, 2010 8:34 AM)  O2 Flow:  Room air CC: Karen appt about diabetic shoes/aj   Referring Provider:  elaine brown, dds  CC:  Karen appt about diabetic shoes/aj.  History of Present Illness: the status of at least 3 ongoing medical problems is addressed today: dm:  pt saw podiatry for diabetic shoes.  i am asked to review her qualification for diabetic shoes.  she numbness of the toes:  persists.   smoking:  she has quit.   htn:  she stopped diltiazem, but takes the diovan-hct as rx'ed. myalgias:  better now.    Current Medications (verified): 1)  Omeprazole 20 Mg  Cpdr (Omeprazole) .... Take 1 By Mouth Qd 2)  Zocor 40 Mg  Tabs (Simvastatin) .Marland Kitchen.. 1 By Mouth Qd 3)  Glucophage Xr 500 Mg  Tb24 (Metformin Hcl) .... Take 2 By Mouth Two Times A Day Qd 4)  Alprazolam 0.25 Mg  Tbdp (Alprazolam) .... Take 1 By Mouth Once Daily As Needed Nerves--Dr Lewitt 5)  Aspirin 325 Mg  Tabs (Aspirin) .... Take 1 By Mouth Qd 6)  Clonazepam Odt 0.5 Mg  Tbdp (Clonazepam) .... Take 1 By Mouth At Bedtime--Dr Lewitt 7)  Citalopram Hydrobromide 20 Mg  Tabs (Citalopram Hydrobromide) .... Take 2 By Mouth Q Am 8)  Dilt-Cd 120 Mg  Xr24h-Cap (Diltiazem Hcl Coated Beads) .... Take 1 By Mouth Qd 9)  Travatan 0.004 % Soln (Travoprost) .... Use 1 Drop At Bedtime 10)  Meclizine Hcl 12.5 Mg Tabs (Meclizine Hcl) .Marland Kitchen.. 1-2 Q6h As Needed Dizziness 11)  Diclofenac Sodium 50 Mg Tbec (Diclofenac Sodium) .Marland Kitchen.. 1 Tab Two Times A Day 12)  Novolin 70/30 70-30 % Susp (Insulin Isophane & Regular) .... 25 Units Am and 10 Units Pm 13)  Bd Insulin Syringe 31g X 5/16" 0.3  Ml Misc (Insulin Syringe-Needle U-100) .... Two Times A Day 14)  Lyrica 50 Mg Caps (Pregabalin) .Marland Kitchen.. 1 Each Morning and 1 Each Night 15)  Diovan Hct 320-25 Mg Tabs (Valsartan-Hydrochlorothiazide) .Marland Kitchen.. 1 Once Daily 16)  Onetouch Ultra Test  Strp (Glucose Blood) .... Two Times A Day, and Lancets 250.00  Allergies (verified): 1)  ! Penicillin 2)  ! * Ivp Dye 3)  Penicillin V Potassium (Penicillin V Potassium)  Past History:  Past Medical History: Last updated: 03/11/2008 Smoker Glaucoma DM Retinopathy Small Mulitinodular Goiter CEREBRAL ANEURYSM (ICD-437.3) ANXIETY STATE, UNSPECIFIED (ICD-300.00) COUGH (ICD-786.2) NEED PROPHYLACTIC VACCINATION&INOCULATION FLU (ICD-V04.81) HYPERLIPIDEMIA (ICD-272.4) HYPERTENSION (ICD-401.9) DIZZINESS OR VERTIGO (ICD-780.4) DIABETES MELLITUS, TYPE II (ICD-250.00)  Review of Systems       she has pain at the plantar aspect of the right forefoot.   she has persistent knee pain  Physical Exam  General:  normal appearance.   Pulses:  dorsalis pedis intact bilat.   Extremities:  no deformity.  no ulcer on the feet.  feet are of normal color and temp.  no edema.  the right great toe is normal  to my exam.  there is no callous on the feet.    Neurologic:  sensation is intact to touch on the feet.    Impression & Recommendations:  Problem # 1:  DIABETES MELLITUS, TYPE II (ICD-250.00) she does not qualify for shoes  Problem # 2:  HYPERTENSION (ICD-401.9) well-controlled, off the diltiazem  Problem # 3:  MYALGIA (XX123456) uncertain etiology  Problem # 4:  smoker she has quit  Other Orders: Est. Patient Level IV [id number])  Patient Instructions: 1)  based on medicare criteria, you do not qualify for diabetic shoes.   2)  Congratulations on quitting smoking!. 3)  please continue diovan-hct for blood pressure.  you can stay-off the diltiazem.   4)  same simvastatin.

## 2010-10-20 NOTE — Assessment & Plan Note (Signed)
Summary: 3 mos f/u // #/ cd   Vital Signs:  Patient profile:   65 year old female Height:      65 inches (165.10 cm) Weight:      182 pounds (82.73 kg) BMI:     30.40 O2 Sat:      96 % on Room air Temp:     98.4 degrees F (36.89 degrees C) oral Pulse rate:   78 / minute BP sitting:   178 / 84  (left arm) Cuff size:   large  Vitals Entered By: Gardenia Phlegm RMA (Jan 30, 2010 10:21 AM)  O2 Flow:  Room air CC: 3 month follow up/ pt states she is not taking Apirin 81, hyzaar, Travatan, Meclizine/ CF Is Patient Diabetic? Yes   Referring Provider:  elaine brown, dds  CC:  3 month follow up/ pt states she is not taking Apirin 81, hyzaar, Travatan, and Meclizine/ CF.  History of Present Illness: the status of at least 3 ongoing medical problems is addressed today: pt states myalgias persist.  she also has muscle cramps, confusion, dry cough, excessive diaphoresis, and pain at the right great toe.  she feels her medications are the cause of these sxs. dyslipidemia:  she stopped her med due to the above. dm:  she has required insulin, and she also takes metformin.  Current Medications (verified): 1)  Omeprazole 20 Mg  Cpdr (Omeprazole) .... Take 1 By Mouth Qd 2)  Adult Aspirin Low Strength 81 Mg  Tbdp (Aspirin) .... Take 1 By Mouth Qd 3)  Zocor 40 Mg  Tabs (Simvastatin) .Marland Kitchen.. 1 By Mouth Qd 4)  Glucophage Xr 500 Mg  Tb24 (Metformin Hcl) .... Take 2 By Mouth Two Times A Day Qd 5)  Alprazolam 0.25 Mg  Tbdp (Alprazolam) .... Take 1 By Mouth Once Daily As Needed Nerves--Dr Lewitt 6)  Aspirin 325 Mg  Tabs (Aspirin) .... Take 1 By Mouth Qd 7)  Clonazepam Odt 0.5 Mg  Tbdp (Clonazepam) .... Take 1 By Mouth At Bedtime--Dr Lewitt 8)  Citalopram Hydrobromide 20 Mg  Tabs (Citalopram Hydrobromide) .... Take 2 By Mouth Q Am 9)  Dilt-Cd 120 Mg  Xr24h-Cap (Diltiazem Hcl Coated Beads) .... Take 1 By Mouth Qd 10)  Travatan 0.004 % Soln (Travoprost) .... Use 1 Drop At Bedtime 11)  Meclizine Hcl 12.5 Mg  Tabs (Meclizine Hcl) .Marland Kitchen.. 1-2 Q6h As Needed Dizziness 12)  Freestyle Lite Test  Strp (Glucose Blood) .... Check Blood Sugar Two Times A Day 13)  Hyzaar 100-25 Mg Tabs (Losartan Potassium-Hctz) .Marland Kitchen.. 1 Qd 14)  Diclofenac Sodium 50 Mg Tbec (Diclofenac Sodium) .Marland Kitchen.. 1 Tab Two Times A Day 15)  Novolin 70/30 70-30 % Susp (Insulin Isophane & Regular) .... 25 Units Am and 10 Units Pm 16)  Bd Insulin Syringe 31g X 5/16" 0.3 Ml Misc (Insulin Syringe-Needle U-100) .... Two Times A Day 17)  Lyrica 50 Mg Caps (Pregabalin) .Marland Kitchen.. 1 Each Morning and 1 Each Night 18)  Benicar Hct 40-25 Mg Tabs (Olmesartan Medoxomil-Hctz) .Marland Kitchen.. 1 Tab Daily  Allergies (verified): 1)  ! Penicillin 2)  ! * Ivp Dye 3)  Penicillin V Potassium (Penicillin V Potassium)  Past History:  Past Medical History: Last updated: 03/11/2008 Smoker Glaucoma DM Retinopathy Small Mulitinodular Goiter CEREBRAL ANEURYSM (ICD-437.3) ANXIETY STATE, UNSPECIFIED (ICD-300.00) COUGH (ICD-786.2) NEED PROPHYLACTIC VACCINATION&INOCULATION FLU (ICD-V04.81) HYPERLIPIDEMIA (ICD-272.4) HYPERTENSION (ICD-401.9) DIZZINESS OR VERTIGO (ICD-780.4) DIABETES MELLITUS, TYPE II (ICD-250.00)  Review of Systems       The patient complains of  weight gain.         she has facial flushing  Physical Exam  General:  normal appearance.   Pulses:  dorsalis pedis intact bilat.   Extremities:  no deformity.  no ulcer on the feet.  feet are of normal color and temp.  no edema.  the right great toe is normal to my exam.  Neurologic:  sensation is intact to touch on the feet  Psych:  Alert and cooperative; normal mood and affect; normal attention span and concentration.   Additional Exam:  Hemoglobin A1C       [H]  8.9 %     Impression & Recommendations:  Problem # 1:  TOE PAIN ([id number]) uncertain etiology  Problem # 2:  HYPERLIPIDEMIA (ICD-272.4) therapy is limited by multiple perceived drug intolerances  Problem # 3:  MYALGIA  (ICD-729.1) persistent.  i told pt this is prob due to fibromyalgia  Problem # 4:  DIABETES MELLITUS, TYPE II (ICD-250.00)  Medications Added to Medication List This Visit: 1)  Novolin 70/30 70-30 % Susp (Insulin isophane & regular) .... 25 units am and 10 units pm 2)  Lyrica 50 Mg Caps (Pregabalin) .Marland Kitchen.. 1 each morning and 1 each night 3)  Benicar Hct 40-25 Mg Tabs (Olmesartan medoxomil-hctz) .Marland Kitchen.. 1 tab daily 4)  Diovan Hct 320-25 Mg Tabs (Valsartan-hydrochlorothiazide) .Marland Kitchen.. 1 once daily 5)  Onetouch Ultra Test Strp (Glucose blood) .... Two times a day, and lancets 250.00  Other Orders: Podiatry Referral (Podiatry) TLB-Lipid Panel (80061-LIPID) TLB-BMP (Basic Metabolic Panel-BMET) (99991111) TLB-CBC Platelet - w/Differential (85025-CBCD) TLB-Hepatic/Liver Function Pnl (80076-HEPATIC) TLB-TSH (Thyroid Stimulating Hormone) (84443-TSH) TLB-A1C / Hgb A1C (Glycohemoglobin) (83036-A1C) TLB-Microalbumin/Creat Ratio, Urine (82043-MALB) TLB-Udip w/ Micro (81001-URINE) TLB-IBC Pnl (Iron/FE;Transferrin) (83550-IBC) Prescription Created Electronically [phone]) Est. Patient Level IV [id number])  Patient Instructions: 1)  tests are being ordered for you today.  a few days after the test(s), please call [phone] to hear your test results. 2)  for your safety, it is best to resume your medications. 3)  refer podiatry.  you will be called with a day and time for an appointment. 4)  change hyzaar to diovan-hct 320/25, 1/day 5)  (update: i left message on phone-tree:  please consier a different chol med, increased rx of , as you are at high risk for another stroke.  let me know if you agree to take). Prescriptions: ONETOUCH ULTRA TEST  STRP (GLUCOSE BLOOD) two times a day, and lancets 250.00  #100 x 11   Entered and Authorized by:   Donavan Foil MD   Signed by:   Donavan Foil MD on 01/30/2010   Method used:   Electronically to        [facility] [phone]* (retail)       [place], [place]  [id number]       Ph: [phone] or [phone]       Fax: [phone]   RxID:   [id number] DIOVAN HCT 320-25 MG TABS (VALSARTAN-HYDROCHLOROTHIAZIDE) 1 once daily  #30 x 11   Entered and Authorized by:   Donavan Foil MD   Signed by:   Donavan Foil MD on 01/30/2010   Method used:   Electronically to        [facility] [phone]* (retail)       [place]  [place], [place]  [id number]       Ph: [phone] or [phone]       Fax: [phone]   RxID:   [id number]

## 2010-10-20 NOTE — Assessment & Plan Note (Signed)
Summary: Abdominal pain, N/V, hx polyps    History of Present Illness Visit Type: consult  Primary GI MD: Scarlette Shorts MD Primary Provider: Donavan Foil, MD  Requesting Provider: Gus Height, MD  Chief Complaint: Abd pain, diarrhea, and nausea and vomiting History of Present Illness:   65 y.o. female w/ hx hypertension, diabetes x 10-12 yrs, hyperlipidemia, cerebrovascular disease, anxiety/ depression, GERD, and multiple prior abdominal and pelvic surgeries including ventral hernia repair x 3 (most recent 10-2008). Presents today for evaluation of a number of chronic GI c/o including abdominal pain, alterred bowel habits, and nausea w/ vomiting. Seen previously in Nov. 2004 for abdominal/chest pain felt secondary to GERD. Colonoscopy at that time revealed diverticulosis and hyperplastic polyps. EGD revealed mild esophagitis and H/H. First, 6-8 month hx of generalized daily abdominal discomfort with associated bloating and "swelling". Somewhat worse w/ food and toward days end.Somewhat better after BM. Has gained 8# past year. Labs in May w/ normal CBC, TSH, and urinalysis. Mild increase in transaminases but o/w normal LFTs. Hg A1C = 8>9%. Contrast CT abdomen and pelvis 04-27-10 was unremarkable w/o recurrent hernia. Gyn evaluation, as recent as 05-29-10 was unremarkable.Next, she reports post prandial urgency. Bm's generally formed. No blood. Fish oil, she feels, helps regulate her bowels. Finally, several month hx of morning nausea w/ vomitting. Coincident w/ stopping omeprazole (after 4+ yrs of therapy). She does report pyrosis and regurgitation... She also has a number of questions about diverticular disease.   GI Review of Systems    Reports abdominal pain, acid reflux, bloating, heartburn, nausea, vomiting, and  weight gain.     Location of  Abdominal pain: generalized.    Denies belching, chest pain, dysphagia with liquids, dysphagia with solids, loss of appetite, vomiting blood, and  weight loss.        Reports constipation, diarrhea, and  diverticulosis.     Denies anal fissure, black tarry stools, change in bowel habit, fecal incontinence, heme positive stool, hemorrhoids, irritable bowel syndrome, jaundice, light color stool, liver problems, rectal bleeding, and  rectal pain.    Current Medications (verified): 1)  Zocor 40 Mg  Tabs (Simvastatin) .Marland Kitchen.. 1 By Mouth Qd 2)  Glucophage Xr 500 Mg  Tb24 (Metformin Hcl) .... Take 2 By Mouth Two Times A Day Qd 3)  Alprazolam 0.25 Mg  Tbdp (Alprazolam) .... Take 1 By Mouth Once Daily As Needed Nerves--Dr Lewitt 4)  Aspirin 325 Mg  Tabs (Aspirin) .... Take 1 By Mouth Qd 5)  Clonazepam Odt 0.5 Mg  Tbdp (Clonazepam) .... Take 1 By Mouth At Bedtime--Dr Lewitt 6)  Citalopram Hydrobromide 20 Mg  Tabs (Citalopram Hydrobromide) .... Take 2 By Mouth Q Am 7)  Travatan 0.004 % Soln (Travoprost) .... Use 1 Drop At Bedtime 8)  Meclizine Hcl 12.5 Mg Tabs (Meclizine Hcl) .Marland Kitchen.. 1-2 Q6h As Needed Dizziness 9)  Novolin 70/30 70-30 % Susp (Insulin Isophane & Regular) .... 28 Units Am and 10 Units Pm 10)  Bd Insulin Syringe 31g X 5/16" 0.3 Ml Misc (Insulin Syringe-Needle U-100) .... Two Times A Day 11)  Diovan Hct 320-25 Mg Tabs (Valsartan-Hydrochlorothiazide) .Marland Kitchen.. 1 Once Daily 12)  Onetouch Ultra Test  Strp (Glucose Blood) .... Two Times A Day, and Lancets 250.00  Allergies (verified): 1)  ! Penicillin 2)  ! * Ivp Dye 3)  Penicillin V Potassium (Penicillin V Potassium)  Past History:  Past Medical History: Smoker Glaucoma DM Retinopathy Small Mulitinodular Goiter CEREBRAL ANEURYSM (ICD-437.3) ANXIETY STATE, UNSPECIFIED (ICD-300.00) COUGH (ICD-786.2)  NEED PROPHYLACTIC VACCINATION&INOCULATION FLU (ICD-V04.81) HYPERLIPIDEMIA (ICD-272.4) HYPERTENSION (ICD-401.9) DIZZINESS OR VERTIGO (ICD-780.4) DIABETES MELLITUS, TYPE II (ICD-250.00) Hyperplastic Colon Polyps Ventral Hernias Hx. of Depression Urinary Tract  Infection GERD Diverticulosis Arthritis  Past Surgical History: Cholecystectomy Hysterectomy (1985) Tubal ligation Breast Lumpectomy, Benign--bilateral Ventral hernia repair (R) Thyroid Nodule Resect (2000) EGD (07/2003) DEXA (2007) Knee Surgery C-Sections x2 Shoulder Surgery   Family History: No FH of Colon Cancer: Family History of Diabetes: Sister  Social History: Occupation: Retired Married 2 childern Alcohol Use - no Daily Caffeine Use: 2 cups Illicit Drug Use - no Patient currently smokes.  Drug Use:  no  Review of Systems       The patient complains of anxiety-new, arthritis/joint pain, back pain, confusion, cough, depression-new, fatigue, headaches-new, muscle pains/cramps, night sweats, and shortness of breath.  The patient denies allergy/sinus, anemia, blood in urine, breast changes/lumps, change in vision, coughing up blood, fainting, fever, hearing problems, heart murmur, heart rhythm changes, itching, menstrual pain, nosebleeds, pregnancy symptoms, skin rash, sleeping problems, sore throat, swelling of feet/legs, swollen lymph glands, thirst - excessive , urination - excessive , urination changes/pain, urine leakage, vision changes, and voice change.    Vital Signs:  Patient profile:   65 year old female Height:      65 inches Weight:      186 pounds BMI:     31.06 BSA:     1.92 Pulse rate:   88 / minute Pulse rhythm:   regular BP sitting:   132 / 68  (left arm) Cuff size:   regular  Vitals Entered By: Hope Pigeon CMA (June 09, 2010 3:13 PM)  Physical Exam  General:  Well developed, well nourished, no acute distress. Head:  Normocephalic and atraumatic. Eyes:  PERRLA, no icterus. Nose:  No deformity, discharge,  or lesions. Mouth:  No deformity or lesions Neck:  Supple; no masses or thyromegaly. Lungs:  Clear throughout to auscultation. Heart:  Regular rate and rhythm; no murmurs, rubs,  or bruits. Abdomen:  Soft, obese, nontender and  nondistended. No masses, hepatosplenomegaly or hernias noted. Normal bowel sounds. Surgical incisions well healed. Rectal:  deferred until colonoscopy Msk:  Symmetrical with no gross deformities. Normal posture. Pulses:  Normal pulses noted. Extremities:  No clubbing, cyanosis, edema or deformities noted. Neurologic:  Alert and  oriented x4;  grossly normal neurologically. Skin:  Intact without significant lesions or rashes. Psych:  Alert and cooperative. Normal mood and affect.   Impression & Recommendations:  Problem # 1:  ABDOMINAL PAIN, GENERALIZED (ICD-789.07) Chronic abdomial discomfort as described. No alarm features. Negative workup to date, as described. Most likely due to a combination of weight gain and adhesions. Other possible causes, esp w/ bowel habit pattern, include IBS or possibly diabetes related dismotility and/or bacterial overgrowth.  Plan: 1.EGD to r/o UGI cause for pain, such as ulcer. 2.Colonoscopy to evaluate pain, alt bowels, and f/u given hx polyps. The nature of both procedures, as well as their risks, benefits, and alternatives reviewed. She understood and agreed to preceed. 3. Movi prep prescribed and pt instructed on its use. 4. Hold diabetic medications the morning of the procecdures  Problem # 2:  NAUSEA WITH VOMITING (ICD-787.01) Suspect due to GERD since coming off PPI. Could be diabetic gastroparesis. R/o other causes (see below)  Plan: 1. Resume omeprazole 2. Reflux precautions w/ attention t oweight loss 3. EGD to r/o other causes such as ulcer or Candida esophagitis  Problem # 3:  ESOPHAGEAL REFLUX (ICD-530.81) See #2  above.  Problem # 4:  CHANGE IN BOWELS (ICD-787.99) Possibly IBS or problems related to poorly controlled diabetes, such as bacterial overgrowth or dysmotility.  1.Colonoscopy 2. Better control of DM 3.Consider broad spectrum antibiotic or Xifaxan trial  Problem # 5:  DIABETES MELLITUS, TYPE II (ICD-250.00) poorly  controlled.  1. adjust meds for procedures 2. Better control may help GI symptoms  Problem # 6:  PERSONAL HISTORY OF COLONIC POLYPS (ICD-V12.72) Plan follow up colonoscopy  Problem # 7:  DIVERTICULOSIS-COLON (ICD-562.10) Discussed w / patient. Questions answered. Supplemental literature provided.  Other Orders: Colon/Endo (Colon/Endo)  Patient Instructions: 1)  Endo/Colon [facility]  07/31/10 8:00 am arrive at 7:30 am 2)  Hold oral diabetic meds morning of procedure. 3)  Movi prep instructions given and Rx. sent to pharmacy. 4)  Colonoscopy and Flexible Sigmoidoscopy brochure given.  5)  Upper Endoscopy brochure given.  6)  Diverticular Disease brochure given.  7)  Copy sent to : Donavan Foil, MD  Gus Height, MD 8)  The medication list was reviewed and reconciled.  All changed / newly prescribed medications were explained.  A complete medication list was provided to the patient / caregiver. Prescriptions: MOVIPREP 100 GM  SOLR (PEG-KCL-NACL-NASULF-NA ASC-C) As per prep instructions.  #1 x 0   Entered by:   Randye Lobo NCMA   Authorized by:   Irene Shipper MD   Signed by:   Randye Lobo NCMA on 06/09/2010   Method used:   Electronically to        [facility] [phone] (retail)       [place], [place]  [id number]       Ph: [phone] or [phone]       Fax: [phone]   RxID:   [id number]

## 2010-10-20 NOTE — Letter (Signed)
Summary: [facility]   Imported By: Phillis Knack 12/20/2009 09:01:03  _____________________________________________________________________  External Attachment:    Type:   Image     Comment:   External Document

## 2010-10-21 ENCOUNTER — Telehealth (INDEPENDENT_AMBULATORY_CARE_PROVIDER_SITE_OTHER): Payer: Self-pay | Admitting: *Deleted

## 2010-10-28 NOTE — Progress Notes (Signed)
  Phone Note Other Incoming   Request: Send information Summary of Call: Request for records received from [facility]. Request forwarded to [facility].  Loanne Drilling records

## 2010-12-01 LAB — GLUCOSE, CAPILLARY
Glucose-Capillary: 188 mg/dL — ABNORMAL HIGH (ref 70–99)
Glucose-Capillary: 258 mg/dL — ABNORMAL HIGH (ref 70–99)

## 2011-01-05 LAB — BASIC METABOLIC PANEL
BUN: 18 mg/dL (ref 6–23)
CO2: 29 mEq/L (ref 19–32)
Calcium: 9.7 mg/dL (ref 8.4–10.5)
Chloride: 101 mEq/L (ref 96–112)
Creatinine, Ser: 1.04 mg/dL (ref 0.4–1.2)
GFR calc Af Amer: >60 mL/min (ref >60)
GFR calc non Af Amer: 44 mL/min — ABNORMAL LOW (ref >60)
Potassium: 4.3 mEq/L (ref 3.5–5.1)
Potassium: 5.1 mEq/L (ref 3.5–5.1)
Sodium: 134 mEq/L — ABNORMAL LOW (ref 135–145)
Sodium: 141 mEq/L (ref 135–145)

## 2011-01-05 LAB — HEMOGLOBIN AND HEMATOCRIT, BLOOD: Hemoglobin: 14.2 g/dL (ref 12.0–15.0)

## 2011-01-05 LAB — GLUCOSE, CAPILLARY
Glucose-Capillary: 167 mg/dL — ABNORMAL HIGH (ref 70–99)
Glucose-Capillary: 193 mg/dL — ABNORMAL HIGH (ref 70–99)
Glucose-Capillary: 194 mg/dL — ABNORMAL HIGH (ref 70–99)
Glucose-Capillary: 199 mg/dL — ABNORMAL HIGH (ref 70–99)

## 2011-01-28 ENCOUNTER — Other Ambulatory Visit: Payer: Self-pay | Admitting: Endocrinology

## 2011-02-01 ENCOUNTER — Other Ambulatory Visit: Payer: Self-pay | Admitting: Neurology

## 2011-02-01 DIAGNOSIS — I6509 Occlusion and stenosis of unspecified vertebral artery: Secondary | ICD-10-CM

## 2011-02-02 NOTE — Procedures (Signed)
NAME:  [name], [name]                ACCOUNT NO.:  [id number]   MEDICAL RECORD NO.:  [id number]          PATIENT TYPE:  OUT   LOCATION:  SLEEP CENTER                 FACILITY:  [facility]   PHYSICIAN:  Clinton D. Annamaria Boots, MD, FCCP, FACPDATE OF BIRTH:  Nov 30[birth year] 1946   DATE OF STUDY:  01/24/2008                            NOCTURNAL POLYSOMNOGRAM   REFERRING PHYSICIAN:  Edison Nasuti, M.D.   INDICATION FOR STUDY:  Hypersomnia with sleep apnea.  BMI 27.3, weight  169 pounds, height 66 inches, neck 13.5 inches.   EPWORTH SLEEPINESS SCORE:  15/24   MEDICATIONS:  Home medications charted and reviewed.   SLEEP ARCHITECTURE:  Total sleep time 373 minutes with sleep efficiency  92.1%.  Stage I was 5.5%, stage II 66.8%, stage III absent, REM 27.7% of  total sleep time.  Sleep latency 8 minutes, REM latency 83 minutes.  Awake after sleep onset 24 minutes.  Arousal index 12.1.  No bedtime  medication taken.   RESPIRATORY DATA:  Apnea/hypopnea index (AHI) 3.9 per hour which is  normal.  24 events were scored including 1 central apnea, 3 obstructive  apneas, and 20 hypopneas.  Most events were while supine.  REM AHI 6.4.  There were insufficient events to qualify for split-night protocol CPAP  titration on this study night.   OXYGEN DATA:  Moderate snoring with oxygen desaturation to a nadir of  87%.  Mean oxygen saturation through the study was 91.6% on room air.   CARDIAC DATA:  Normal sinus rhythm.   MOVEMENT-PARASOMNIA:  Mild periodic limb movement with a total of 6  events causing arousal averaging 1 per hour.   IMPRESSIONS-RECOMMENDATIONS:  1. Occasional respiratory events disturbing sleep, apnea/hypopnea      index 3.9 per hour.  This is normal (normal range 0-5 per hour).      Events were more common while supine as expected.  Mild to moderate      snoring with oxygen desaturation to a nadir of 87%.  A total of 6      minutes were spent with saturation less than 90% and no time with  saturation less than 88%.  2. No specific intervention is suggested for these results.  3. Very mild periodic limb movement with arousal syndrome, one per      hour.      Clinton D. Annamaria Boots, MD, [facility], FACP  Diplomate, Tax adviser of Sleep Medicine  Electronically Signed     CDY/MEDQ  D:  01/27/2008 10:34:27  T:  01/27/2008 10:47:49  Job:  [id number]

## 2011-02-02 NOTE — Op Note (Signed)
[name], [name]                ACCOUNT NO.:  [id number]   MEDICAL RECORD NO.:  [id number]          PATIENT TYPE:  OIB   LOCATION:  [id number]                         FACILITY:  [facility]   PHYSICIAN:  Isabel Caprice. Hassell Done, MD  DATE OF BIRTH:  1945-10-12   DATE OF PROCEDURE:  11/08/2008  DATE OF DISCHARGE:                               OPERATIVE REPORT   PREOPERATIVE DIAGNOSIS:  Multiple recurrent ventral hernias.   PROCEDURE:  Laparoscopic enterolysis and repair of midline ventral  hernias with 20 x 25 cm Parietex composite mesh.   SURGEON:  Isabel Caprice. Hassell Done, MD   ASSISTANT:  Dickey Gave, MD   ANESTHESIA:  General endotracheal.   FINDINGS:  Midline Swiss cheese with large part of ventral hernia around  the umbilicus.   SPECIMENS:  None.   DESCRIPTION OF PROCEDURE:  [name] was taken to OR 1 on Friday,  November 08, 2008, and given general anesthesia.  The abdomen was  prepped with Techni-Care equivalent and draped sterilely.  I entered the  abdomen through the left upper quadrant using a 5 mm zero degree  Optiview without difficulty.  I encountered a large amount of omentum  stuck up into her midline incision and into her hernias.  Using a  Harmonic scalpel and blunt dissection I teased this all the way and  freed up the anterior abdominal wall in toto.  Everything that was  connected to the abdominal wall was omentum like and I stayed away from  the bowel.  Once this was completely taken down we exposed the hernia  and I went ahead and mapped out the area right around it.  This seemed  to be right in the periumbilical region.  I used the needles to kind of  measure that and then I measured 5 cm away from the perimeter which gave  [place] about a 20 cm need for mesh.  Down below she had some attenuation in  the lower part of this old incision so I elected to go ahead and use a  25 cm piece of mesh and a vertical axis to cover the entire length of  the incision and mark a spot  for initial tack down in the lower part of  that wound and then up near the falciform and then laterally.  Four such  sutures of zero Prolene were sutured into the mesh and placed.  The mesh  was then rolled and brought in through a 12 mm port laterally.  I then  threaded the sutures up through the previously marked sites making  little small transverse incisions and using the suture passer.  Laterally on the left side I incorporated the tie of Prolene with the 12  mm trocar site which had been placed obliquely and this way I would just  bring it up into that trocar site area for coverage.  When that was  completed these were pulled up and tied down.  The mesh was intact all  around the perimeter and in the midline with a tacker not quite using  all the tacks but three from  a tacker.  I then further reinforced it by  spacing four sutures more on the inside and more right around the hernia  using the Endo passer with Gore-Tex suture material and tied those down  to further tack the mesh up to the anterior abdominal wall.  A good  intact repair with Parietex was felt to be present.  Everything looked  to be in order.  We decompressed and  then closed the wounds with 4-0 Vicryl with Benzoin and Steri-Strips.  The patient tolerated the procedure well.  We injected the wound sites  with some Marcaine and closed with Benzoin and Steri-Strips.  The  patient tolerated the procedure well and was taken to the recovery room  in satisfactory condition.      Isabel Caprice Hassell Done, MD  Electronically Signed     MBM/MEDQ  D:  11/08/2008  T:  11/08/2008  Job:  [id number]   cc:   Gus Height, M.D.  Fax: [place]. Loanne Drilling, [address] [place]  [place] [address]

## 2011-02-05 NOTE — Op Note (Signed)
[name], HASKEW NO.:  [id number]   MEDICAL RECORD NO.:  [id number]          PATIENT TYPE:  AMB   LOCATION:  [place]                           FACILITY:  [facility]   PHYSICIAN:  Gus Height, M.D.       DATE OF BIRTH:  1946-08-28   DATE OF PROCEDURE:  01/13/2007  DATE OF DISCHARGE:                               OPERATIVE REPORT   PREOPERATIVE DIAGNOSIS:  Solid right adnexal mass of right ovary.   POSTOPERATIVE DIAGNOSES:  1. Ovarian fibrothecoma.  2. Anterior abdominal wall adhesions in the area of the previous      umbilical herniorrhaphy.   PROCEDURE:  1. Exploratory laparotomy.  2. Lysis of adhesions.  3. bilateral salpingo-oophorectomy.   SURGEON:  Gus Height, M.D.   ASSISTANT:  Josefa Half, M.D.   ANESTHESIA:  General.   COMPLICATIONS:  None.   JUSTIFICATION:  The patient is a 65 year old black female, status post  previous hysterectomy, who on examination was noted have a mass in her  right adnexa.  CT scan was performed and a 7- to 8-cm solid mass was  found involving the right ovary.  No other abnormalities were noted in  the abdomen.  Because of the size and nature of this mass, the patient  is being taken to the operating at this time to undergo bilateral  salpingo-oophorectomy and to ensure that this does not represent a  malignant ovarian tumor.   PROCEDURE:  The patient was taken to the operating room and placed in a  supine position.  General anesthesia was administered without  difficulty.  She was then placed in the dorsal lithotomy position,  prepped and draped in the usual sterile fashion.  Foley catheter was  inserted.  Her previous midline incision was then incised to the level  of the umbilicus and then extended down through subcutaneous tissue.  The fascia was then identified and incised vertically.  At this point,  the peritoneum was entered and then the incision was extended under  direct visualization.  It was noted that the  patient had a previous  umbilical hernia repair with mesh and that there were extensive  adhesions involving this area with the transverse colon adhered into the  herniorrhaphy site.  An adequate amount of time was taken to free the  transverse colon off the anterior abdominal wall with the colon being  adherent to the previously placed mesh.  It was possible to release the  colon without any injury to the bowel.  After this was done, the area of  mesh was excised; this now freed the abdominal wall and the surgery then  continued.  A self-retaining retractor was placed through the wound.  The ureters were packed out of pelvis.  Washings were taken for  cytology.  Inspection revealed there to be a solid, irregularly shaped  70cm tumor involving the right ovary, adherent in one area to the  lateral pelvic sidewall.  There was no evidence of any external  excrescences; external surfaces appeared to be smooth and regular.  It  was possible to free the  ovary from the adhesions holding it to the  pelvic sidewall, elevate it and then with the ureter being identified,  the infundibular pelvic ligament was identified and then using 2 Kelly  clamps, the pedicle was clamped and cut and the specimen was excised and  sent for frozen section.  This pedicle was then ligated using suture  ligatures of 0 Vicryl and then a free tie of 0 Vicryl.  Hemostasis was  readily achieved.  The specimen was sent for frozen section.  The frozen  section report revealed this to be a benign fibroma or fibrothecoma;  final pathology is pending.  Once this was done, the left ovary was  inspected; it was noted be small and adherent to the lateral pelvic  sidewall.  The adhesions holding the ovary to the lateral pelvic  sidewall were freed.  This ovary was elevated and again with the ureter  being out of the field, the infundibulopelvic ligament was clamped, cut  and the specimen excised.  The pedicles were ligated using 2  ligatures  of 0 Vicryl.  With no other abnormalities being noted, once again  careful inspection of the transverse colon was made and the colon  intact, it elected to complete the procedure.  The fascia and peritoneum  were closed using a 0 PDS suture; this began at the cephalad portion of  the incision and extended down to the pubic symphysis.  The subcutaneous  tissue was closed using interrupted 0 Vicryl sutures and then the skin  incision was closed using staples.  The estimated blood loss was  minimal, approximately 50 mL.  The patient was then reversed from the  anesthetic and taken to the recovery room in satisfactory condition  after lap counts and instrument counts were taken and found to be  correct.  The patient tolerated the procedure well.      Gus Height, M.D.  Electronically Signed     AR/MEDQ  D:  01/13/2007  T:  01/13/2007  Job:  [id number]

## 2011-02-05 NOTE — Discharge Summary (Signed)
[name], IMAMURA NO.:  [id number]   MEDICAL RECORD NO.:  [id number]          PATIENT TYPE:  INP   LOCATION:  [id number]                          FACILITY:  [facility]   PHYSICIAN:  Gus Height, M.D.       DATE OF BIRTH:  07/22/1946   DATE OF ADMISSION:  01/13/2007  DATE OF DISCHARGE:  01/16/2007                               DISCHARGE SUMMARY   PREOPERATIVE DIAGNOSIS:  Solid right adnexal mass.   OPERATIONS AND PROCEDURES:  Exploratory laparotomy, bilateral salpingo-  oophorectomy, lysis of adhesions, repair of umbilical hernia.  General  anesthesia.   BRIEF HISTORY:  The patient is a 65 year old black female noted on  physical examination to have a mass in the area of the right adnexa.  Ultrasound examination revealed this mass to be solid in nature,  measuring 7 cm in size.  Because of the nature of this ovarian mass, it  was felt that this represented a solid ovarian neoplasm, and the patient  was scheduled for admission and exploratory laparotomy.   HOSPITAL COURSE:  The patient had preoperative labs obtained, and on  January 13, 2007 under general anesthesia, exploratory laparotomy was  carried out.  The patient had again a solid mass involving the right  ovary.  This was excised and sent for frozen section.  The final  pathology report on this ovarian mass proved to be an ovarian thecoma.  The patient underwent bilateral salpingo-oophorectomy.  She had had  previous hysterectomy.  The patient was noted to have extensive anterior  abdominal wall adhesions.  These were taken down without difficulty.  The patient also was noted to have a previous umbilical hernia repair  with mesh.  Due to the surgical incision, the mesh had to be excised,  and the area of the umbilical hernia reclosed.  These procedures were  all carried out without difficulty.   HOSPITAL COURSE:  The patient's postoperative course was remarkable.  She remained stable.  Her hemoglobin remained  stable.  She tolerated  increasing ambulation and diet well and by the third postoperative day  was able to be sent home.   Medications for home included Tylox one every 3 to 4 hours as needed for  pain.  She was instructed no heavy lifting.  Call if there are any  problems such as fever, pain or heavy bleeding and to return to the  office in 6 days to have her staples removed.  The final pathology  report on the right ovarian tumor with consultation with Dr. Annamaria Boots at  the [facility] proved this to be a benign thecoma of  the right ovary.  Left ovary was benign with a small fibrous cortical  nodule.      Gus Height, M.D.  Electronically Signed     AR/MEDQ  D:  01/27/2007  T:  01/27/2007  Job:  [id number]

## 2011-02-05 NOTE — Consult Note (Signed)
[name]                          ENDOCRINOLOGY CONSULTATION   NAME:[name], [name]                       MRN:          [id number]  DATE:11/30/2006                            DOB:          Feb 15, 1946    REASON FOR VISIT:  Follow up tremor.   HISTORY OF PRESENT ILLNESS:  A 65 year old woman, who states she  recently saw Dr. Harrington [name] for a tremor in her right hand.  He prescribed  Paxil 20 mg daily.  She states this made her feel tired, so she tried  dividing it up to 10 mg twice a day.  She states she was still too  tired.  However, it did help her tremor.   Regarding her diabetes, she states that the Actos plus Janumet did not  control her glucose, so she went back to Actos plus Glucovance.  On  this, she is frequently having hypoglycemia.   She also has a history of a small multinodular goiter previously noted  on ultrasound.   PAST MEDICAL HISTORY:  1. Cerebrovascular aneurysm.  2. Diabetes retinopathy.  3. Chronic intermittent vertigo.  4. Glaucoma.  5. Cigarette smoker.  6. Hypertension.   REVIEW OF SYSTEMS:  She denies excessive diaphoresis and palpitations.   PHYSICAL EXAMINATION:  VITAL SIGNS:  Blood pressure 149/81, heart rate  80, temperature 97.8, weight 175.  GENERAL:  No distress.  She is alert and oriented, and she does not  appear anxious nor depressed at the time of my visit.  NEUROLOGIC:  There is no tremor at this time.   LABORATORY STUDIES:  On November 30, 2006, TSH 1.21.   IMPRESSION:  1. Tremor which may be related to anxiety.  She is euthyroid.  2. Her therapy for diabetes is limited by hypoglycemia.  She is very      hesitant to take insulin.  3. History of small, multinodular goiter.   PLAN:  1. Refer back to neurology, Dr. Jacolyn [name], to check on her tremor.  2. She requests a different kind of medication to take for the tremor      in the meantime, so I have prescribed her some BuSpar 15 mg twice a      day.  3.  Decrease Glucovance to 2.5/500, 2 twice a day.  4. Continue Actos 45 mg daily.  5. Recheck ultrasound of the thyroid in a year or sooner if necessary.     Karen A. Loanne Drilling, MD  Electronically Signed    SAE/MedQ  DD: 12/01/2006  DT: 12/03/2006  Job #: [id number]   cc:   [name], M.D.

## 2011-02-05 NOTE — Op Note (Signed)
NAME:  [name], [name]                          ACCOUNT NO.:  [id number]   MEDICAL RECORD NO.:  [id number]                   PATIENT TYPE:  OIB   LOCATION:  NA                                   FACILITY:  [facility]   PHYSICIAN:  Kathrin Penner, M.D.                DATE OF BIRTH:  [birth year]   DATE OF PROCEDURE:  08/10/2002  DATE OF DISCHARGE:                                 OPERATIVE REPORT   PREOPERATIVE DIAGNOSIS:  Ventral hernia.   POSTOPERATIVE DIAGNOSIS:  Ventral hernia.   OPERATION PERFORMED:  Repair of ventral hernia.   SURGEON:  Kathrin Penner, M.D.   ASSISTANT:  Sharyn Dross, RNFA   ANESTHESIA:  General.   INDICATIONS FOR PROCEDURE:  The patient is a 66 year old woman presenting  with abdominal pain and a mass in the infra and supraumbilical region.  She  has had previous C-sections in the past.  She also complains of some pain  going down her whole classic C-section scar.  She is brought to the  operating room now after the risks and potential benefits of surgery have  been fully discussed, all questions answered and consent obtained.   DESCRIPTION OF PROCEDURE:  Following the induction of satisfactory general  anesthesia with the patient positioned supinely.  The abdomen was doubly  prepped and draped to be included in a sterile operative field.  A midline  incision was carried down from just above the umbilicus, skirting the  umbilicus and carried down to the old midline incision in the lower abdomen.  This was deepened down through the skin and subcutaneous tissue carrying the  dissection down to the hernia sac.  The hernia sac was dissected free from  the subumbilical space and the region of the falciform ligament included  within the hernia sac was then clamped and secured with ties of 2-0 silk.  The hernial defect was then explored.  There were no adhesions to the  hernial defect and exploration along the anterior midline wound did not note  any adhesions to the  anterior abdominal wall and there were no adhesions in  the right lower quadrant or any other abnormality noted within the abdomen.  The hernial defect was repaired with a large mesh plug placed over a  Seprafilm slurry which was placed into the abdomen so as to prevent the mesh  from sticking to the viscera.  The mesh was then sewn in with interrupted 0  Novofil sutures completing the repair.  Sponge, instrument and sharp counts  were then verified.  The repair was noted to be intact.  Subcutaneous  tissues were closed with a running 2-0 Vicryl suture and the skin closed  with a running 4-0 Monocryl suture.  The wound was infiltrated with 0.5%  Marcaine with epinephrine.  The wound was then reinforced with Steri-Strips.  Sterile dressings were applied.  Anesthetic was reversed and the patient  removed  from the operating room to the recovery room in stable condition,  having tolerated the procedure well.                                                 Kathrin Penner, M.D.   PB/MEDQ  D:  08/10/2002  T:  08/10/2002  Job:  [id number]

## 2011-02-08 ENCOUNTER — Ambulatory Visit
Admission: RE | Admit: 2011-02-08 | Discharge: 2011-02-08 | Disposition: A | Discharge status: Home / Self Care | Payer: Medicare Other | Source: Ambulatory Visit | Attending: Neurology | Admitting: Neurology

## 2011-02-08 DIAGNOSIS — I6509 Occlusion and stenosis of unspecified vertebral artery: Secondary | ICD-10-CM

## 2011-02-08 MED ORDER — GADOBENATE DIMEGLUMINE 529 MG/ML IV SOLN
17.0000 mL | Freq: Once | INTRAVENOUS | Status: AC | PRN
  Administered 2011-02-08: 17 mL via INTRAVENOUS

## 2011-03-19 ENCOUNTER — Other Ambulatory Visit: Payer: Self-pay | Admitting: Endocrinology

## 2011-03-29 ENCOUNTER — Other Ambulatory Visit: Payer: Self-pay | Admitting: Internal Medicine

## 2011-03-29 DIAGNOSIS — I739 Peripheral vascular disease, unspecified: Secondary | ICD-10-CM

## 2011-03-31 ENCOUNTER — Other Ambulatory Visit: Payer: Self-pay | Admitting: Internal Medicine

## 2011-03-31 ENCOUNTER — Ambulatory Visit
Admission: RE | Admit: 2011-03-31 | Discharge: 2011-03-31 | Disposition: A | Discharge status: Home / Self Care | Payer: PRIVATE HEALTH INSURANCE | Source: Ambulatory Visit | Attending: Internal Medicine | Admitting: Internal Medicine

## 2011-03-31 DIAGNOSIS — I739 Peripheral vascular disease, unspecified: Secondary | ICD-10-CM

## 2011-05-03 ENCOUNTER — Encounter: Payer: Self-pay | Admitting: Vascular Surgery

## 2011-05-03 ENCOUNTER — Ambulatory Visit (INDEPENDENT_AMBULATORY_CARE_PROVIDER_SITE_OTHER): Payer: PRIVATE HEALTH INSURANCE | Admitting: Vascular Surgery

## 2011-05-03 VITALS — BP 174/62 | HR 89 | Resp 20 | Ht 66.0 in | Wt 194.0 lb

## 2011-05-03 DIAGNOSIS — I70219 Atherosclerosis of native arteries of extremities with intermittent claudication, unspecified extremity: Secondary | ICD-10-CM

## 2011-05-03 NOTE — Progress Notes (Signed)
Subjective:     Patient ID: [name], female   DOB: 16-Jan-1946, 65 y.o.   MRN: [id number]  HPI this 65 year old female was referred for claudication symptoms in both legs. She states she walks about 50-100 yards in both calves become heavy and tired. She also has symptoms in her thighs but not as severe. He has no history of rest pain nonhealing ulcers infection or gangrene. She will also have some occasional lower back discomfort. She states she has "restless leg syndrome". Occasionally she has sharp pains in her toes. Her calf discomfort is limiting her ability to walk on a daily basis significantly. She had lower extremity arterial Doppler studies performed with exercise a [facility]. ABI on the right was 0.74 and on the left was 0.53 at rest. This decreased 2.2 to on the left and 0.35 on the right with 1 minute of exercise  Review of Systems  Constitutional: Negative for fever, chills, activity change, appetite change, fatigue and unexpected weight change.  HENT: Negative for hearing loss, congestion, sore throat, trouble swallowing, neck pain and voice change.   Eyes: Negative for visual disturbance.  Respiratory: Negative for cough, chest tightness, shortness of breath, wheezing and stridor.   Cardiovascular: Negative for chest pain, palpitations and leg swelling.  Gastrointestinal: Negative for nausea, abdominal pain, diarrhea, constipation, blood in stool and abdominal distention.  Genitourinary: Negative for dysuria, urgency, frequency, hematuria and difficulty urinating.  Musculoskeletal: Positive for back pain and joint swelling. Negative for gait problem.  Skin: Negative for color change, pallor, rash and wound.  Neurological: Negative for dizziness, seizures, syncope, facial asymmetry, speech difficulty, weakness, light-headedness, numbness and headaches.       She has a history of a "stroke" which cause facial asymmetry but resolved in 4 months. She has none 50% narrowing of  her carotid arteries. She has a small right intracerebral aneurysm.  Hematological: Negative for adenopathy. Does not bruise/bleed easily.  Psychiatric/Behavioral: Negative for confusion.       Objective:   Physical Exam  Constitutional: She is oriented to person, place, and time. She appears well-developed and well-nourished.  HENT:  Head: Normocephalic.  Nose: Nose normal.  Mouth/Throat: Oropharynx is clear and moist.  Eyes: EOM are normal. Pupils are equal, round, and reactive to light. No scleral icterus.  Neck: Normal range of motion. Neck supple. No JVD present.  Cardiovascular: Normal rate, regular rhythm and normal heart sounds.   No murmur heard.      Both feet are well perfused. Right leg has a 3+ femoral 2+ popliteal and 2+ posterior tibial pulse palpable. Left leg is 3+ femoral with no popliteal or distal pulses palpable. There is intact motion and sensation.  Pulmonary/Chest: Effort normal and breath sounds normal. No stridor. No respiratory distress. She has no wheezes. She has no rales.  Abdominal: Soft. She exhibits no distension and no mass. There is no tenderness. There is no rebound and no guarding.  Musculoskeletal: Normal range of motion. She exhibits no edema and no tenderness.  Lymphadenopathy:    She has no cervical adenopathy.  Neurological: She is alert and oriented to person, place, and time. Coordination normal.  Skin: Skin is warm and dry. No rash noted. No erythema.  Psychiatric: She has a normal mood and affect. Her behavior is normal.  Blood pressure 174/62, pulse 89, resp. rate 20, height 5\' 6"  (1.676 m), weight 194 lb (87.998 kg).    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    Plan:     Aortobifemoral angiography with runoff by Dr. Trula Slade on Tuesday, August 21. Possible PTA and stenting of left superficial femoral artery if indicated. Possible PTA and stenting  of right lower trimmed the vessels if indicated.

## 2011-05-11 ENCOUNTER — Ambulatory Visit (HOSPITAL_COMMUNITY)
Admission: RE | Admit: 2011-05-11 | Discharge: 2011-05-11 | Disposition: A | Discharge status: Home / Self Care | Payer: Medicare Other | Source: Ambulatory Visit | Attending: Surgery | Admitting: Surgery

## 2011-05-11 DIAGNOSIS — Z01810 Encounter for preprocedural cardiovascular examination: Secondary | ICD-10-CM | POA: Insufficient documentation

## 2011-05-11 DIAGNOSIS — I70219 Atherosclerosis of native arteries of extremities with intermittent claudication, unspecified extremity: Secondary | ICD-10-CM | POA: Insufficient documentation

## 2011-05-11 HISTORY — PX: FEMORAL ARTERY STENT: SHX1583

## 2011-05-11 LAB — POCT I-STAT, CHEM 8
Chloride: 103 mEq/L (ref 96–112)
Creatinine, Ser: 1.3 mg/dL — ABNORMAL HIGH (ref 0.50–1.10)
Glucose, Bld: 259 mg/dL — ABNORMAL HIGH (ref 70–99)
Hemoglobin: 14.6 g/dL (ref 12.0–15.0)
Potassium: 4 mEq/L (ref 3.5–5.1)
Sodium: 139 mEq/L (ref 135–145)

## 2011-05-11 LAB — POCT ACTIVATED CLOTTING TIME
Activated Clotting Time: 204 seconds
Activated Clotting Time: 210 seconds

## 2011-05-16 ENCOUNTER — Emergency Department (HOSPITAL_COMMUNITY): Payer: Medicare Other

## 2011-05-16 ENCOUNTER — Emergency Department (HOSPITAL_COMMUNITY)
Admission: EM | Admit: 2011-05-16 | Discharge: 2011-05-16 | Disposition: A | Discharge status: Home / Self Care | Payer: Medicare Other | Attending: Emergency Medicine | Admitting: Emergency Medicine

## 2011-05-16 DIAGNOSIS — Z8679 Personal history of other diseases of the circulatory system: Secondary | ICD-10-CM | POA: Insufficient documentation

## 2011-05-16 DIAGNOSIS — R002 Palpitations: Secondary | ICD-10-CM | POA: Insufficient documentation

## 2011-05-16 DIAGNOSIS — M79609 Pain in unspecified limb: Secondary | ICD-10-CM

## 2011-05-16 DIAGNOSIS — M7989 Other specified soft tissue disorders: Secondary | ICD-10-CM

## 2011-05-16 DIAGNOSIS — I10 Essential (primary) hypertension: Secondary | ICD-10-CM | POA: Insufficient documentation

## 2011-05-16 DIAGNOSIS — I4510 Unspecified right bundle-branch block: Secondary | ICD-10-CM | POA: Insufficient documentation

## 2011-05-16 LAB — DIFFERENTIAL
Basophils Absolute: 0.1 10*3/uL (ref 0.0–0.1)
Basophils Relative: 1 % (ref 0–1)
Eosinophils Absolute: 0.3 10*3/uL (ref 0.0–0.7)
Eosinophils Relative: 3 % (ref 0–5)
Lymphocytes Relative: 22 % (ref 12–46)
Monocytes Absolute: 0.8 10*3/uL (ref 0.1–1.0)

## 2011-05-16 LAB — GLUCOSE, CAPILLARY: Glucose-Capillary: 126 mg/dL — ABNORMAL HIGH (ref 70–99)

## 2011-05-16 LAB — POCT I-STAT, CHEM 8
Creatinine, Ser: 1.5 mg/dL — ABNORMAL HIGH (ref 0.50–1.10)
Glucose, Bld: 266 mg/dL — ABNORMAL HIGH (ref 70–99)
HCT: 40 % (ref 36.0–46.0)
Hemoglobin: 13.6 g/dL (ref 12.0–15.0)
Potassium: 4 mEq/L (ref 3.5–5.1)
TCO2: 27 mmol/L (ref 0–100)

## 2011-05-16 LAB — CBC
HCT: 36.8 % (ref 36.0–46.0)
MCHC: 33.4 g/dL (ref 30.0–36.0)
Platelets: 283 10*3/uL (ref 150–400)
RDW: 12.6 % (ref 11.5–15.5)
WBC: 11.2 10*3/uL — ABNORMAL HIGH (ref 4.0–10.5)

## 2011-05-16 LAB — POCT I-STAT TROPONIN I: Troponin i, poc: 0 ng/mL (ref 0.00–0.08)

## 2011-05-20 NOTE — Op Note (Signed)
[name], [name]                ACCOUNT NO.:  [id number]  MEDICAL RECORD NO.:  [id number]  LOCATION:  [facility]                         FACILITY:  [facility]  PHYSICIAN:  Theotis Burrow IV, MDDATE OF BIRTH:  [birth year]  DATE OF PROCEDURE:  05/11/2011 DATE OF DISCHARGE:  05/11/2011                              OPERATIVE REPORT   PREOPERATIVE DIAGNOSIS:  Bilateral claudication, left greater than right.  POSTOPERATIVE DIAGNOSIS:  Bilateral claudication, left greater than right.  PROCEDURES PERFORMED: 1. Ultrasound access, right femoral artery. 2. Abdominal aortogram. 3. Bilateral lower extremity runoff. 4. Third-order catheterization. 5. Atherectomy, left superficial femoral and popliteal artery. 6. Stent, left superficial femoral and popliteal artery. 7. Closure device (ProGlide).  SURGEON: 1. [name] IV, MD  INDICATIONS:  This is a 65 year old female with bilateral claudication with decreased ABIs.  This is lifestyle limiting for her.  She comes in today for procedure with possible intervention.  PROCEDURE IN DETAIL:  The patient was identified in the holding, taken to room 8, and placed supine on the table.  Both groins were prepped and draped in the usual fashion.  A time-out was called.  The right femoral artery was evaluated by ultrasound and found to be widely patent.  A digital ultrasound image was acquired.  The right femoral artery was then accessed under ultrasound guidance with an 18-gauge needle and an 0.035 wire was advanced into the aorta under fluoroscopic visualization. A 5-French sheath was placed over the wire and Omni flush catheter was advanced to the level of L1.  Abdominal aortogram was performed in both in AP and lateral projection.  Next, the catheter was pulled down to the aortic bifurcation and bilateral runoff was performed.  During this maneuvering of the Omni flush catheter, I checked a pressure gradient across an area of irregularity within  the aorta and there was no pressure gradient.  FINDINGS:  Aortogram:  The visualized portions of the suprarenal abdominal aorta showed no significant disease.  Single renal arteries bilaterally without hemodynamically significant stenosis.  The infrarenal abdominal aorta is widely patent.  There is an area of luminal irregularity at the level of the inferior mesenteric artery, however, there is no pressure gradient across this area.  Bilateral external iliac arteries are widely patent.  Bilateral internal iliac arteries are widely patent.  Bilateral common iliac arteries are widely patent.  Right lower extremity:  The right common femoral artery is widely patent without significant disease.  The right profunda femoral artery is widely patent.  The right superficial femoral artery is patent, however, there are multiple areas of high-grade stenosis.  The popliteal artery is patent throughout its course.  The patient has two-vessel runoff via the posterior tibial and peroneal.  Left lower extremity:  The left common femoral artery is widely patent. The left profunda femoral artery is widely patent.  The left superficial femoral artery is occluded with reconstitution in the above-knee popliteal artery.  The patient has a high takeoff of the anterior tibial artery which has diffuse disease throughout.  The posterior tibial and peroneal arteries are patent.  The dominant runoff is the posterior tibial artery.  At this point in time, the decision  was made to intervene over a Bentson wire.  The 5-French sheath was exchanged out for a 7-French Ansel-1 sheath.  The patient was systemically heparinized.  I then used a Glidewire and angled catheter to select the stump of the superficial femoral artery.  I then selected the crossover device.  Through the angle catheter, the crossover device was placed.  Subintimal recanalization was then performed using the crossover device.  This includes  atherectomy of the superficial femoral and popliteal artery. Reentry was performed in the above-knee popliteal artery.  A contrast injection was performed with the catheter in the above-knee popliteal artery to confirm successful cannulation.  Next, an 0.014 Spartacore wire was placed and then an 0.035 catheter was advanced over the wire and the 0.014 wire was removed and exchanged out for a 0.035 Versacore wire.  Next, I performed predilation of the subintimal tract using a 4- mm balloon.  I elected to primarily stent these superficial femoral and popliteal arteries.  The distal stent was a Cook Zilver 6 x 140.  I extended this proximally with a Cook Zilver 7 x 140 and the most proximal stent was a Psychologist, educational 7 x 80 which landed at the origin of the superficial femoral artery.  The subintimal stents were then dilated using a 5-mm balloon.  Completion arteriogram revealed widely patent left superficial femoral and popliteal artery with runoff that was similar to preprocedure.  The patient had a palpable posterior tibial pulse at the ankle.  At this point, the decision was made to terminate the procedure.  The sheath was withdrawn into the right external iliac artery.  A retrograde femoral angiogram was performed to evaluate for closure device.  Access was adequate.  A ProGlide was successfully deployed.  The patient was taken to the holding area for observation.  IMPRESSION: 1. Luminal irregularity within the infrarenal abdominal aorta without     pressure gradient. 2. Diffuse disease throughout the right superficial femoral artery. 3. Left superficial femoral and popliteal occlusion, successfully     treated with atherectomy using the crossover device and primary     stenting using a Cook Zilver stents.  The distal stent was a 6 x     140, the middle stent was a 7 x 140, and the proximal stent was a 7     x 80.     Eldridge Abrahams, MD     VWB/MEDQ  D:  05/11/2011  T:   05/11/2011  Job:  [id number]  Electronically Signed by Orvan Falconer IV MD on 05/20/2011 12:08:33 AM

## 2011-05-24 NOTE — Consult Note (Signed)
[name], [name] NO.:  [id number]  MEDICAL RECORD NO.:  [id number]  LOCATION:                                 FACILITY:  PHYSICIAN:  Conrad Verplanck, MD       DATE OF BIRTH:  [birth year]  DATE OF CONSULTATION: DATE OF DISCHARGE:                                CONSULTATION   REFERRING PHYSICIAN:  Linton Flemings, MD in the ER at [facility].  REASON FOR CONSULTATION:  Left leg swelling after a recent endovascular procedure.  HISTORY OF PRESENT ILLNESS:  This is a 65 year old patient who presents with chief complaint of left leg swelling recently after an endovascular procedure.  She notes the swelling developed a few days after the procedure and after that also she noted some red spots on her left leg. She denies any pain.  She notes that the leg is nice and warm.  She is able to ambulate with this.  She comes in with concerns of this increased swelling in this leg and these red dots.  Her symptoms of claudication in this left leg are somewhat improved from previous.  She is not absolutely clear if her claudication is resolved as she has been limiting her ambulation due to the swelling and onset of these red dots.  PAST MEDICAL HISTORY:  Peripheral vascular disease, history of stroke, hyperlipidemia, tobacco abuse with diabetes, hypertension, and anxiety disorder.  PAST SURGICAL HISTORY:  Endovascular procedure, left SFA subintimal recanalization, and stenting performed on May 11, 2011.  SOCIAL HISTORY:  She has a greater than 40-pack-year history.  Denies alcohol, illicit drug use.  FAMILY HISTORY:  Noncontributory in this case.  There is no known family history of peripheral vascular disease.  Both parents had myocardial infarctions in their 4s.  MEDICATIONS:  Xalatan, Zocor, Prilosec, insulin 70/30, metformin, losartan, hydrochlorothiazide, fish oil, Cosopt, Xanax, Celexa, Wellbutrin, aspirin.  ALLERGIES:  AMOXICILLIN.  PHYSICAL EXAMINATION:   VITAL SIGNS:  She had a blood pressure of 137/59, heart rate is 72, respirations are 19, satting 96% on room air. GENERAL:  Well developed, well nourished, in no apparent distress. HEENT:  Head is normocephalic, atraumatic.  Pupils are equal, round, and reactive to light.  Oropharynx without any erythema or exudate.  Hearing is grossly intact. NECK:  Supple neck with no nuchal rigidity.  No obvious cervical lymphadenopathy or any obvious JVD. PULMONARY:  Symmetric expansion.  Good air movement.  No rales, rhonchi, or wheezing. CARDIAC:  Regular rate and rhythm.  Normal S1, S2.  No murmurs, rubs, thrills, or gallops. ABDOMINAL:  Soft abdomen, nontender, nondistended.  No guarding, no rebound, no hepatosplenomegaly. VASCULAR:  Easily palpable upper extremity pulses.  Carotids are without any bruits.  Abdominal aorta could not be palpated due to mild obesity. The femorals are easily palpable and appreciate popliteals on either side.  The right leg had no easily palpable pedal pulses on the left. There was a weakly palpable DP. MUSCULOSKELETAL:  Strength is 5/5.  Lower extremities demonstrated a warm left foot that was warmer than the right side.  There is some petechial hemorrhaging around the level of the ankle, but none in the toes  themselves.  No splinter hemorrhaging noted,  slightly asymmetry in the two legs with some slight increased swelling in this left leg. NEUROLOGIC:  Grossly her cranial nerves are intact.  Her motor is as above.  Sensation is grossly intact in all extremities. PSYCH:  Judgment is intact.  Mood and affect, appropriate for clinical situation. SKIN:  I did not appreciate any other petechiae anywhere else on her right, and also did not appreciate any rashes elsewhere. LYMPHATIC:  No cervical, axillary, or inguinal lymphadenopathy.  Noninvasive vascular imaging:  I reviewed the left leg duplex of her leg and there was no evidence of  any DVT at any site.   Incidentally on the same study, the arteries were  briefly imaged and showed no demonstration of any compromise in arterial  flow at the level of the stent.  MEDICAL DECISION-MAKING:  This is a 65 year old patient who underwent a endovascular left subintimal recanalization of the SFA.  There is a slight amount of increase in swelling.  I am not absolutely clear the etiology for the swelling, this may be just post procedure swelling due to increased flow to this left foot, which we commonly see with bypass patients.  The petechial areas the patient noted came on after the procedure and not immediately the day after, so my suspicion is this is either completely unrelated to the procedure, are also possibly due to some embolic phenomenon from the area recannulization regardless the treatment in this case would be an antiplatelet agent.  She is on a full dose of 325 aspirin already.  She has a reaction to Plavix, so that is not an option in this patient.  I would continue with this aspirin dose and then have her follow up with either Dr. Trula Slade or Dr. Kellie Simmering, not this coming week, but the next week and I will arrange for this, but otherwise I do not think any additional therapies other than continued ambulation is necessary in this case.     Conrad [place], MD     BLC/MEDQ  D:  05/16/2011  T:  05/16/2011  Job:  [id number]  Electronically Signed by Adele Barthel MD on 05/24/2011 08:44:40 PM

## 2011-05-25 ENCOUNTER — Encounter: Payer: Medicare Other | Admitting: Vascular Surgery

## 2011-05-31 ENCOUNTER — Encounter: Payer: Self-pay | Admitting: Vascular Surgery

## 2011-06-01 ENCOUNTER — Ambulatory Visit (INDEPENDENT_AMBULATORY_CARE_PROVIDER_SITE_OTHER): Payer: Medicare Other | Admitting: Vascular Surgery

## 2011-06-01 ENCOUNTER — Encounter: Payer: Self-pay | Admitting: Vascular Surgery

## 2011-06-01 VITALS — BP 165/71 | HR 77 | Resp 16 | Ht 64.0 in | Wt 194.0 lb

## 2011-06-01 DIAGNOSIS — I70209 Unspecified atherosclerosis of native arteries of extremities, unspecified extremity: Secondary | ICD-10-CM

## 2011-06-01 DIAGNOSIS — I739 Peripheral vascular disease, unspecified: Secondary | ICD-10-CM

## 2011-06-01 DIAGNOSIS — I771 Stricture of artery: Secondary | ICD-10-CM

## 2011-06-01 NOTE — Progress Notes (Signed)
Subjective:     Patient ID: [name], female   DOB: 07/26/1946, 65 y.o.   MRN: [id number]  HPI this 65 year old female had percutaneous intervention by Dr. Trula Slade on August 21. She had atherectomy of the left superficial femoral artery and popliteal artery with a stent placement. She tolerated the procedure well and had an excellent hemodynamic result. She was seen in the emergency department by Dr. Vallarie Mare on August 26 for swelling and some mild discoloration of the left ankle area she returns today for further followup. Her left leg claudication symptoms are resolved she states. She has not walked enough to know how the right leg is doing. Rolling and discoloration to the left ankle area she states have resolved. She is on daily aspirin. She has a lesion in the right leg which also will require intervention more than likely.  Review of Systems     Objective:   Physical Exam Blood pressure 165/71 heart rate 77 respirations 16 Generally she is well-developed well-nourished female in [place] and oriented x3 Lower extremity exam reveals 3+ femoral popliteal dorsalis pedis and posterior tibial pulse in the left leg. There is no edema or discoloration of the left foot. Right lower extremity has 3+ femoral pulses no popliteal or distal pulses. There is no evidence of pseudoaneurysms or problems in writing where it appeared    Assessment:     Doing well post left superficial femoral and popliteal intervention by Dr. Trula Slade on 821    Plan:     Return in 6-8 weeks to see Dr. Trula Slade with ABIs at that time to decide if right leg intervention will be scheduled Continue aspirin therapy Patient is allergic to Plavix

## 2011-07-02 ENCOUNTER — Telehealth: Payer: Self-pay | Admitting: *Deleted

## 2011-07-02 NOTE — Telephone Encounter (Signed)
Per MD, pt is due for F/U OV. Pt states that she has a new provider, Dr. Rachel Moulds.

## 2011-07-07 ENCOUNTER — Other Ambulatory Visit: Payer: Self-pay

## 2011-07-07 DIAGNOSIS — I739 Peripheral vascular disease, unspecified: Secondary | ICD-10-CM

## 2011-07-12 ENCOUNTER — Encounter: Payer: Self-pay | Admitting: Surgery

## 2011-07-12 ENCOUNTER — Ambulatory Visit (INDEPENDENT_AMBULATORY_CARE_PROVIDER_SITE_OTHER): Payer: Medicare Other | Admitting: Surgery

## 2011-07-12 ENCOUNTER — Ambulatory Visit (INDEPENDENT_AMBULATORY_CARE_PROVIDER_SITE_OTHER): Payer: Medicare Other | Admitting: Vascular Surgery

## 2011-07-12 VITALS — BP 160/80 | HR 80 | Resp 16 | Ht 64.0 in | Wt 190.0 lb

## 2011-07-12 DIAGNOSIS — I739 Peripheral vascular disease, unspecified: Secondary | ICD-10-CM

## 2011-07-12 DIAGNOSIS — I70219 Atherosclerosis of native arteries of extremities with intermittent claudication, unspecified extremity: Secondary | ICD-10-CM

## 2011-07-12 DIAGNOSIS — Z48812 Encounter for surgical aftercare following surgery on the circulatory system: Secondary | ICD-10-CM

## 2011-07-12 DIAGNOSIS — E119 Type 2 diabetes mellitus without complications: Secondary | ICD-10-CM

## 2011-07-12 NOTE — Progress Notes (Signed)
[facility] [place]   Patient name: [name] MRN: [id number] DOB: 01/03/1946 Sex: female     Chief Complaint  Patient presents with  . Follow-up    LLE PTA /stent placement on 05/10/11,  ABIs were done today.    HISTORY OF PRESENT ILLNESS: The patient is here today for followup. She was originally seen and evaluated by Dr. Kellie Simmering for claudication, left leg greater than right. Outside ABIs were 0.74 on the right and 0.53 on the left these decreased to 0.35 on the right with exercise and 0.2 on the left with exercise. The patient underwent recanalization of an occluded left superficial femoral and popliteal artery and August 2012. In several days post procedure she was seen in the emergency department for leg swelling. She states today that she has had a dramatic improvement in the symptoms in her left leg. Her right leg bothers her only after approximately 10 minutes of activity. Her swelling has improved.  Past Medical History  Diagnosis Date  . Stroke   . Phlebitis     30 years ago  left leg  . Dizziness   . Headache   . Arthritis   . Muscle pain   . Anxiety   . GERD (gastroesophageal reflux disease)   . Shortness of breath   . Wheezing   . Migraines   . Diabetes mellitus   . Hyperlipidemia   . Hypertension   . Leg pain     Past Surgical History  Procedure Date  . Leg pain     with walking  . Cesarean section   . Cholecystectomy   . Knee surgery   . Rotator cuff surgery   . Thyroid surgery   . Hernia repair     times two  . Abdominal hysterectomy 1984  . Femoral artery stent 05/11/11    Left superficial femoral and popliteal artery    History   Social History  . Marital Status: Married    Spouse Name: N/A    Number of Children: N/A  . Years of Education: N/A   Occupational History  . Not on file.   Social History Main Topics  . Smoking status: Current Everyday Smoker -- 0.5 packs/day for 40 years    Types: Cigarettes  .  Smokeless tobacco: Never Used  . Alcohol Use: No  . Drug Use: No  . Sexually Active: Not on file   Other Topics Concern  . Not on file   Social History Narrative  . No narrative on file    Family History  Problem Relation Age of Onset  . Heart disease Mother   . Heart disease Father     Allergies as of 07/12/2011 - Review Complete 07/12/2011  Allergen Reaction Noted  . Amoxicillin  05/03/2011  . Penicillins  04/04/2007    Current Outpatient Prescriptions on File Prior to Visit  Medication Sig Dispense Refill  . ALPRAZolam (XANAX) 0.25 MG tablet Take 0.25 mg by mouth at bedtime as needed.        Marland Kitchen buPROPion (ZYBAN) 150 MG 12 hr tablet Take 150 mg by mouth 2 (two) times daily.        . citalopram (CELEXA) 20 MG tablet Take 20 mg by mouth daily.        . clonazePAM (KLONOPIN) 0.5 MG tablet Take 0.5 mg by mouth Nightly.        . Dorzolamide HCl-Timolol Mal (COSOPT OP) Apply to eye.        Marland Kitchen  insulin aspart protamine-insulin aspart (NOVOLOG 70/30) (70-30) 100 UNIT/ML injection Inject 40 Units into the skin daily with breakfast.        . insulin aspart protamine-insulin aspart (NOVOLOG 70/30) (70-30) 100 UNIT/ML injection Inject 25 Units into the skin daily with supper.        . losartan-hydrochlorothiazide (HYZAAR) 100-12.5 MG per tablet Take 1 tablet by mouth daily.        . metFORMIN (GLUCOPHAGE) 500 MG tablet Take 500 mg by mouth 2 (two) times daily with a meal.        . omeprazole (PRILOSEC) 20 MG capsule TAKE ONE CAPSULE BY MOUTH EVERY DAY  90 capsule  0  . pregabalin (LYRICA) 50 MG capsule Take 50 mg by mouth 2 (two) times daily. Each morning and nightly       . simvastatin (ZOCOR) 40 MG tablet TAKE ONE TABLET BY MOUTH DAILY  90 tablet  0     REVIEW OF SYSTEMS: No change  PHYSICAL EXAMINATION: General: The patient appears their stated age.  Vital signs are BP 160/80  Pulse 80  Resp 16  Ht 5\' 4"  (1.626 m)  Wt 190 lb (86.183 kg)  BMI 32.61 kg/m2 Pulmonary: There is a  good air exchange bilaterally Musculoskeletal: There are no major deformities.   Neurologic: No focal weakness or paresthesias are detected, Skin: There are no ulcer or rashes noted. Psychiatric: The patient has normal affect. Cardiovascular: Palpable left dorsalis pedis pulse right side is not palpable   Diagnostic Studies ABIs today R. 0.92 on the left 0.89 on the right  Assessment: Status post left leg intervention with stenting Plan: The patient's claudication symptoms on the left have completely resolved. She is pleased with her results. She does have symptoms on the right leg but they are not lifestyle limiting for her at this time. She we placed on her ultrasound protocol that study will be in 3 months. After that she'll followup with a PA and see Dr. Kellie Simmering for continued surveillance. I did discuss with her the possibility of recurrent disease within her stents.  Eldridge Abrahams, M.D. [facility] [place] Office: [phone]

## 2011-08-10 ENCOUNTER — Other Ambulatory Visit: Payer: Self-pay | Admitting: Endocrinology

## 2011-09-21 DIAGNOSIS — I729 Aneurysm of unspecified site: Secondary | ICD-10-CM

## 2011-09-21 HISTORY — DX: Aneurysm of unspecified site: I72.9

## 2011-10-15 ENCOUNTER — Ambulatory Visit: Payer: Medicare Other

## 2011-10-15 ENCOUNTER — Other Ambulatory Visit: Payer: Medicare Other

## 2011-11-01 DIAGNOSIS — E782 Mixed hyperlipidemia: Secondary | ICD-10-CM | POA: Diagnosis not present

## 2011-11-01 DIAGNOSIS — Z79899 Other long term (current) drug therapy: Secondary | ICD-10-CM | POA: Diagnosis not present

## 2011-11-01 DIAGNOSIS — E119 Type 2 diabetes mellitus without complications: Secondary | ICD-10-CM | POA: Diagnosis not present

## 2011-11-01 DIAGNOSIS — I10 Essential (primary) hypertension: Secondary | ICD-10-CM | POA: Diagnosis not present

## 2011-11-01 DIAGNOSIS — E559 Vitamin D deficiency, unspecified: Secondary | ICD-10-CM | POA: Diagnosis not present

## 2011-11-01 DIAGNOSIS — M109 Gout, unspecified: Secondary | ICD-10-CM | POA: Diagnosis not present

## 2011-11-08 DIAGNOSIS — I10 Essential (primary) hypertension: Secondary | ICD-10-CM | POA: Diagnosis not present

## 2011-11-08 DIAGNOSIS — E559 Vitamin D deficiency, unspecified: Secondary | ICD-10-CM | POA: Diagnosis not present

## 2011-11-08 DIAGNOSIS — E109 Type 1 diabetes mellitus without complications: Secondary | ICD-10-CM | POA: Diagnosis not present

## 2011-11-11 ENCOUNTER — Encounter: Payer: Self-pay | Admitting: Thoracic Diseases

## 2011-11-12 ENCOUNTER — Ambulatory Visit: Payer: Medicare Other

## 2011-12-06 DIAGNOSIS — E782 Mixed hyperlipidemia: Secondary | ICD-10-CM | POA: Diagnosis not present

## 2011-12-06 DIAGNOSIS — E109 Type 1 diabetes mellitus without complications: Secondary | ICD-10-CM | POA: Diagnosis not present

## 2011-12-06 DIAGNOSIS — I10 Essential (primary) hypertension: Secondary | ICD-10-CM | POA: Diagnosis not present

## 2011-12-23 ENCOUNTER — Other Ambulatory Visit: Payer: Self-pay | Admitting: *Deleted

## 2011-12-23 ENCOUNTER — Encounter: Payer: Self-pay | Admitting: Neurosurgery

## 2011-12-23 DIAGNOSIS — I70219 Atherosclerosis of native arteries of extremities with intermittent claudication, unspecified extremity: Secondary | ICD-10-CM

## 2011-12-24 ENCOUNTER — Encounter (INDEPENDENT_AMBULATORY_CARE_PROVIDER_SITE_OTHER): Payer: Medicare Other | Admitting: *Deleted

## 2011-12-24 ENCOUNTER — Encounter: Payer: Self-pay | Admitting: Neurosurgery

## 2011-12-24 ENCOUNTER — Ambulatory Visit (INDEPENDENT_AMBULATORY_CARE_PROVIDER_SITE_OTHER): Payer: Medicare Other | Admitting: Neurosurgery

## 2011-12-24 VITALS — BP 151/76 | HR 77 | Resp 16 | Ht 66.0 in | Wt 185.9 lb

## 2011-12-24 DIAGNOSIS — I70219 Atherosclerosis of native arteries of extremities with intermittent claudication, unspecified extremity: Secondary | ICD-10-CM

## 2011-12-24 DIAGNOSIS — I739 Peripheral vascular disease, unspecified: Secondary | ICD-10-CM

## 2011-12-24 DIAGNOSIS — Z48812 Encounter for surgical aftercare following surgery on the circulatory system: Secondary | ICD-10-CM

## 2011-12-24 NOTE — Progress Notes (Signed)
[facility] [place] HISTORY AND PHYSICAL   CC: Six-month lower extremity ABI followup Referring Physician: Linna Darner  History of Present Illness: Says a 66 year old patient of Dr. Kellie Simmering who underwent  recanalization of an occluded left superficial femoral and popliteal artery and August 2012 by Dr. Trula Slade the patient reports no signs of claudication no rest pain and no open wounds on the lower extremities. He does have some change in her lower arterial ABI today. Overall the patient thinks she is doing well and has no disruption to her ADLs.   Past Medical History  Diagnosis Date  . Stroke   . Phlebitis     30 years ago  left leg  . Dizziness   . Headache   . Arthritis   . Muscle pain   . Anxiety   . GERD (gastroesophageal reflux disease)   . Shortness of breath   . Wheezing   . Migraines   . Diabetes mellitus   . Hyperlipidemia   . Hypertension   . Leg pain     ROS: [x]  Positive   [ ]  Denies    General: [ ]  Weight loss, [ ]  Fever, [ ]  chills Neurologic: [ ]  Dizziness, [ ]  Blackouts, [ ]  Seizure [ ]  Stroke, [ ]  "Mini stroke", [ ]  Slurred speech, [ ]  Temporary blindness; [ ]  weakness in arms or legs, [ ]  Hoarseness Cardiac: [ ]  Chest pain/pressure, [ ]  Shortness of breath at rest [ ]  Shortness of breath with exertion, [ ]  Atrial fibrillation or irregular heartbeat Vascular: [ ]  Pain in legs with walking, [ ]  Pain in legs at rest, [ ]  Pain in legs at night,  [ ]  Non-healing ulcer, [ ]  Blood clot in vein/DVT,   Pulmonary: [ ]  Home oxygen, [ ]  Productive cough, [ ]  Coughing up blood, [ ]  Asthma,  [ ]  Wheezing Musculoskeletal:  [ ]  Arthritis, [ ]  Low back pain, [ ]  Joint pain Hematologic: [ ]  Easy Bruising, [ ]  Anemia; [ ]  Hepatitis Gastrointestinal: [ ]  Blood in stool, [ ]  Gastroesophageal Reflux/heartburn, [ ]  Trouble swallowing Urinary: [ ]  chronic Kidney disease, [ ]  on HD - [ ]  MWF or [ ]  TTHS, [ ]  Burning with urination, [ ]  Difficulty  urinating Skin: [ ]  Rashes, [ ]  Wounds Psychological: [ ]  Anxiety, [ ]  Depression   Social History History  Substance Use Topics  . Smoking status: Current Everyday Smoker -- 0.5 packs/day for 40 years    Types: Cigarettes  . Smokeless tobacco: Never Used  . Alcohol Use: No    Family History Family History  Problem Relation Age of Onset  . Heart disease Mother   . Heart disease Father     Allergies  Allergen Reactions  . Amoxicillin   . Penicillins     REACTION: unspecified    Current Outpatient Prescriptions  Medication Sig Dispense Refill  . ALPRAZolam (XANAX) 0.25 MG tablet Take 0.25 mg by mouth at bedtime as needed.        Marland Kitchen aspirin 325 MG tablet Take 325 mg by mouth daily.        Marland Kitchen buPROPion (ZYBAN) 150 MG 12 hr tablet Take 150 mg by mouth 2 (two) times daily.        . citalopram (CELEXA) 20 MG tablet Take 20 mg by mouth daily.        . clonazePAM (KLONOPIN) 0.5 MG tablet Take 0.5 mg by mouth Nightly.        Marland Kitchen  Dorzolamide HCl-Timolol Mal (COSOPT OP) Apply to eye.        . insulin aspart protamine-insulin aspart (NOVOLOG 70/30) (70-30) 100 UNIT/ML injection Inject 40 Units into the skin daily with breakfast.        . insulin aspart protamine-insulin aspart (NOVOLOG 70/30) (70-30) 100 UNIT/ML injection Inject 25 Units into the skin daily with supper.        . losartan-hydrochlorothiazide (HYZAAR) 100-12.5 MG per tablet Take 1 tablet by mouth daily.        . metFORMIN (GLUCOPHAGE) 500 MG tablet Take 500 mg by mouth 2 (two) times daily with a meal.        . omeprazole (PRILOSEC) 20 MG capsule TAKE ONE CAPSULE BY MOUTH EVERY DAY  90 capsule  0  . pregabalin (LYRICA) 50 MG capsule Take 50 mg by mouth 2 (two) times daily. Each morning and nightly       . simvastatin (ZOCOR) 40 MG tablet TAKE ONE TABLET BY MOUTH DAILY  90 tablet  0    Physical Examination  Filed Vitals:   12/24/11 1400  BP: 151/76  Pulse: 77  Resp: 16    Body mass index is 30.01  kg/(m^2).  General:  WDWN in NAD Gait: Normal HEENT: WNL Eyes: Pupils equal Pulmonary: normal non-labored breathing , without Rales, rhonchi,  wheezing Cardiac: RRR, without  Murmurs, rubs or gallops; No carotid bruits Abdomen: soft, NT, no masses Skin: no rashes, ulcers noted Vascular Exam/Pulses: Patient has audible posterior tibial and dorsalis pedis pulses bilaterally via Doppler. She has 2+ radial pulses bilaterally, popliteal pulses are nonpalpable  Extremities without ischemic changes, no Gangrene , no cellulitis; no open wounds;  Musculoskeletal: no muscle wasting or atrophy  Neurologic: A&O X 3; Appropriate Affect ; SENSATION: normal; MOTOR FUNCTION:  moving all extremities equally. Speech is fluent/normal  Non-Invasive Vascular Imaging: Her right ABI today is 0.67 left 0.60 previously her ABIs in October 2012 the right was 0.89 on the left was 0.92 shows a significant decrease  ASSESSMENT/PLAN: I discussed the ABI exam with the patient she understands that there has been some decrease in blood flow are also elevated velocities at the proximal and distal point of the left stent proximally her velocity is 401 distally is 384 therefore I will recommend she return in 3 months for repeat ABIs and followup with Dr. Trula Slade. She is in agreement with this she knows to contact our office if she has any difficulties sooner  Beatris Ship ANP  Clinic M.D.: Bridgett Larsson

## 2011-12-24 NOTE — Progress Notes (Signed)
Addended by: Mena Goes on: 12/24/2011 02:57 PM   Modules accepted: Orders

## 2011-12-27 ENCOUNTER — Other Ambulatory Visit: Payer: Self-pay | Admitting: *Deleted

## 2011-12-27 ENCOUNTER — Encounter (HOSPITAL_COMMUNITY): Payer: Self-pay | Admitting: Pharmacy Technician

## 2011-12-27 ENCOUNTER — Encounter: Payer: Self-pay | Admitting: *Deleted

## 2011-12-27 NOTE — Procedures (Unsigned)
LOWER EXTREMITY ARTERIAL DUPLEX  INDICATION:  Follow up left lower extremity PTA with stent placement.  HISTORY: Diabetes:  Yes. Cardiac: Hypertension:  Yes. Smoking:  Current. Previous Surgery:  Left lower extremity PTA with stent placement August 2012.  SINGLE LEVEL ARTERIAL EXAM                         RIGHT                LEFT Brachial:               172                  170 Anterior tibial:        57                   90 Posterior tibial:       115                  104 Peroneal: Ankle/Brachial Index:   0.67                 0.60  LOWER EXTREMITY ARTERIAL DUPLEX EXAM  DUPLEX:  Patent bilateral lower extremity arterial systems with elevated velocities noted throughout.  IMPRESSION: 1. Bilateral ankle brachial indices are suggestive of moderate     arterial disease and have declined significantly in comparison to     the previous exam. 2. Patent bilateral lower extremity arterial systems with velocity     measurements shown on the following worksheet.  ___________________________________________ V. Leia Alf, MD  EM/MEDQ  D:  12/27/2011  T:  12/27/2011  Job:  [id number]

## 2012-01-03 MED ORDER — SODIUM CHLORIDE 0.9 % IV SOLN
INTRAVENOUS | Status: DC
  Administered 2012-01-04: 1000 mL via INTRAVENOUS

## 2012-01-04 ENCOUNTER — Encounter (HOSPITAL_COMMUNITY)
Admission: RE | Disposition: A | Discharge status: Home / Self Care | Payer: Self-pay | Source: Ambulatory Visit | Attending: Surgery

## 2012-01-04 ENCOUNTER — Other Ambulatory Visit: Payer: Self-pay | Admitting: *Deleted

## 2012-01-04 ENCOUNTER — Ambulatory Visit (HOSPITAL_COMMUNITY)
Admission: RE | Admit: 2012-01-04 | Discharge: 2012-01-04 | Disposition: A | Discharge status: Home / Self Care | Payer: Medicare Other | Source: Ambulatory Visit | Attending: Surgery | Admitting: Surgery

## 2012-01-04 DIAGNOSIS — E785 Hyperlipidemia, unspecified: Secondary | ICD-10-CM | POA: Insufficient documentation

## 2012-01-04 DIAGNOSIS — Y831 Surgical operation with implant of artificial internal device as the cause of abnormal reaction of the patient, or of later complication, without mention of misadventure at the time of the procedure: Secondary | ICD-10-CM | POA: Insufficient documentation

## 2012-01-04 DIAGNOSIS — T82898A Other specified complication of vascular prosthetic devices, implants and grafts, initial encounter: Secondary | ICD-10-CM | POA: Diagnosis not present

## 2012-01-04 DIAGNOSIS — E119 Type 2 diabetes mellitus without complications: Secondary | ICD-10-CM | POA: Diagnosis not present

## 2012-01-04 DIAGNOSIS — I70209 Unspecified atherosclerosis of native arteries of extremities, unspecified extremity: Secondary | ICD-10-CM | POA: Diagnosis not present

## 2012-01-04 DIAGNOSIS — I10 Essential (primary) hypertension: Secondary | ICD-10-CM | POA: Diagnosis not present

## 2012-01-04 DIAGNOSIS — I70219 Atherosclerosis of native arteries of extremities with intermittent claudication, unspecified extremity: Secondary | ICD-10-CM

## 2012-01-04 HISTORY — PX: ABDOMINAL AORTAGRAM: SHX5454

## 2012-01-04 LAB — GLUCOSE, CAPILLARY: Glucose-Capillary: 207 mg/dL — ABNORMAL HIGH (ref 70–99)

## 2012-01-04 LAB — POCT I-STAT, CHEM 8
BUN: 20 mg/dL (ref 6–23)
Chloride: 104 mEq/L (ref 96–112)
Glucose, Bld: 241 mg/dL — ABNORMAL HIGH (ref 70–99)
HCT: 40 % (ref 36.0–46.0)
Potassium: 3.9 mEq/L (ref 3.5–5.1)

## 2012-01-04 SURGERY — ABDOMINAL AORTAGRAM
Anesthesia: LOCAL

## 2012-01-04 MED ORDER — ONDANSETRON HCL 4 MG/2ML IJ SOLN: 4.0000 mg | Freq: Four times a day (QID) | INTRAMUSCULAR | Status: DC | PRN

## 2012-01-04 MED ORDER — METOPROLOL TARTRATE 1 MG/ML IV SOLN: 2.0000 mg | INTRAVENOUS | Status: DC | PRN

## 2012-01-04 MED ORDER — SODIUM CHLORIDE 0.9 % IV SOLN: 1.0000 mL/kg/h | INTRAVENOUS | Status: DC

## 2012-01-04 MED ORDER — LIDOCAINE HCL (PF) 1 % IJ SOLN
INTRAMUSCULAR | Status: AC
  Filled 2012-01-04: qty 30

## 2012-01-04 MED ORDER — OXYCODONE-ACETAMINOPHEN 5-325 MG PO TABS: 1.0000 | ORAL_TABLET | ORAL | Status: DC | PRN

## 2012-01-04 MED ORDER — HEPARIN (PORCINE) IN NACL 2-0.9 UNIT/ML-% IJ SOLN
INTRAMUSCULAR | Status: AC
  Filled 2012-01-04: qty 1000

## 2012-01-04 MED ORDER — MORPHINE SULFATE 10 MG/ML IJ SOLN: 2.0000 mg | INTRAMUSCULAR | Status: DC | PRN

## 2012-01-04 MED ORDER — HEPARIN SODIUM (PORCINE) 1000 UNIT/ML IJ SOLN
INTRAMUSCULAR | Status: AC
  Filled 2012-01-04: qty 1

## 2012-01-04 MED ORDER — MORPHINE SULFATE 4 MG/ML IJ SOLN
INTRAMUSCULAR | Status: AC
  Administered 2012-01-04: 4 mg
  Filled 2012-01-04: qty 1

## 2012-01-04 MED ORDER — HYDRALAZINE HCL 20 MG/ML IJ SOLN: 10.0000 mg | INTRAMUSCULAR | Status: DC | PRN

## 2012-01-04 MED ORDER — ACETAMINOPHEN 325 MG PO TABS: 650.0000 mg | ORAL_TABLET | ORAL | Status: DC | PRN

## 2012-01-04 MED ORDER — FENTANYL CITRATE 0.05 MG/ML IJ SOLN
INTRAMUSCULAR | Status: AC
  Filled 2012-01-04: qty 2

## 2012-01-04 MED ORDER — MIDAZOLAM HCL 2 MG/2ML IJ SOLN
INTRAMUSCULAR | Status: AC
  Filled 2012-01-04: qty 2

## 2012-01-04 MED ORDER — LABETALOL HCL 5 MG/ML IV SOLN: 10.0000 mg | INTRAVENOUS | Status: DC | PRN

## 2012-01-04 NOTE — Op Note (Signed)
[facility] [place]  Patient name: [name] MRN: [id number] DOB: 06-15-1946 Sex: female  01/04/2012 Pre-operative Diagnosis: In-stent stenosis Post-operative diagnosis:  Same Surgeon:  Eldridge Abrahams Procedure Performed:  1.  ultrasound access right femoral artery  2.  abdominal aortogram  3.  bilateral lower extremity runoff  4.  third order catheterization  5.  angioplasty left superficial femoral and popliteal artery     Indications:  The patient is status post recanalization of an occluded superficial femoral and popliteal artery for claudication. She was found to have a decrease in her ankle-brachial indices as well as elevated velocity profile within her stents. She is been scheduled for a repeat arteriogram and possible intervention as her symptoms have returned.  Procedure:  The patient was identified in the holding area and taken to room 8.  The patient was then placed supine on the table and prepped and draped in the usual sterile fashion.  A time out was called.  Ultrasound was used to evaluate the right common femoral artery.  It was patent .  A digital ultrasound image was acquired. The right common femoral artery was accessed under ultrasound guidance with an 18-gauge needle. A Benson wire was advanced into the aorta under fluoroscopic visualization and a 5 french sheath was placed.  An omniflush catheter was advanced over the wire to the level of L-1.  An abdominal angiogram was obtained.  Next, using the omniflush catheter and a benson wire, the aortic bifurcation was crossed and the catheter was placed into theleft external iliac artery and left runoff was obtained.  right runoff was performed via retrograde sheath injections.  Findings:   Aortogram:  The suprarenal abdominal aorta is not well visualized. Single renal arteries are identified without evidence of stenosis. There is a large arc of [place] present. There is narrowing/stenosis within  the infrarenal abdominal aorta of approximately 40%. There is no pressure gradient across this lesion. Bilateral common external and internal iliac arteries are widely patent.  Right Lower Extremity:  The right common femoral artery is widely patent. A right profunda femoral artery is widely patent. There is diffuse disease throughout the superficial femoral artery with several areas of 50% stenosis. Popliteal artery is patent throughout it's course. There is three-vessel runoff via a diseased anterior tibial peroneal and posterior tibial artery.  Left Lower Extremity:  The left common femoral artery is patent throughout it's course. There is luminal narrowing at its bifurcation. Profundofemoral artery is widely patent. The stents within the superficial femoral arteries show evidence of in-stent stenosis. Multiple areas of greater than 70% stenosis are visualized throughout the entire length of the stent. The popliteal artery is patent below the stents. Is three-vessel runoff.  Intervention:  After the above images were obtained the decision was made to intervene. Over a Benson wire the 5 [place] sheath was upsized to a 6 [place] Ansel 1 sheath. A woolly wire was used to access the left superficial femoral artery. It was advanced without resistance into the popliteal artery through the superficial artery stents. The patient was fully heparinized. I selected a 6 x 1 20 [place] balloon and perform balloon angioplasty throughout the entire stented area taking the balloon to 10 atmospheres. A completion study was then performed which showed significant improvement throughout the entire length of the in-stent stenosis. There remained stenosis of approximately 30-40% at the origin of the superficial femoral artery stent. Because of this location in the distal common femoral artery I  was reluctant to proceed with more proximal angioplasty given the risk of dissection and the common femoral artery. A completion study  revealed significant improvement in the runoff was unchanged and therefore elected to terminate the procedure. Catheters and wires were removed. The patient was taken to the holding area for sheath pull once her coagulation profile corrects  Impression:  #1  diffuse in-stent stenosis throughout the stented area of the superficial femoral and popliteal artery on the left. This was successfully treated using a 6 mm balloon  #2  several areas of focal stenosis, approximately 50% within the right superficial femoral artery     V. [name], M.D. [facility] [place] Office: [phone] Pager:  [phone]

## 2012-01-04 NOTE — H&P (View-Only) (Signed)
[facility] [place] HISTORY AND PHYSICAL   CC: Six-month lower extremity ABI followup Referring Physician: Linna Darner  History of Present Illness: Says a 66 year old patient of Dr. Kellie Simmering who underwent  recanalization of an occluded left superficial femoral and popliteal artery and August 2012 by Dr. Trula Slade the patient reports no signs of claudication no rest pain and no open wounds on the lower extremities. He does have some change in her lower arterial ABI today. Overall the patient thinks she is doing well and has no disruption to her ADLs.   Past Medical History  Diagnosis Date  . Stroke   . Phlebitis     30 years ago  left leg  . Dizziness   . Headache   . Arthritis   . Muscle pain   . Anxiety   . GERD (gastroesophageal reflux disease)   . Shortness of breath   . Wheezing   . Migraines   . Diabetes mellitus   . Hyperlipidemia   . Hypertension   . Leg pain     ROS: [x]  Positive   [ ]  Denies    General: [ ]  Weight loss, [ ]  Fever, [ ]  chills Neurologic: [ ]  Dizziness, [ ]  Blackouts, [ ]  Seizure [ ]  Stroke, [ ]  "Mini stroke", [ ]  Slurred speech, [ ]  Temporary blindness; [ ]  weakness in arms or legs, [ ]  Hoarseness Cardiac: [ ]  Chest pain/pressure, [ ]  Shortness of breath at rest [ ]  Shortness of breath with exertion, [ ]  Atrial fibrillation or irregular heartbeat Vascular: [ ]  Pain in legs with walking, [ ]  Pain in legs at rest, [ ]  Pain in legs at night,  [ ]  Non-healing ulcer, [ ]  Blood clot in vein/DVT,   Pulmonary: [ ]  Home oxygen, [ ]  Productive cough, [ ]  Coughing up blood, [ ]  Asthma,  [ ]  Wheezing Musculoskeletal:  [ ]  Arthritis, [ ]  Low back pain, [ ]  Joint pain Hematologic: [ ]  Easy Bruising, [ ]  Anemia; [ ]  Hepatitis Gastrointestinal: [ ]  Blood in stool, [ ]  Gastroesophageal Reflux/heartburn, [ ]  Trouble swallowing Urinary: [ ]  chronic Kidney disease, [ ]  on HD - [ ]  MWF or [ ]  TTHS, [ ]  Burning with urination, [ ]  Difficulty  urinating Skin: [ ]  Rashes, [ ]  Wounds Psychological: [ ]  Anxiety, [ ]  Depression   Social History History  Substance Use Topics  . Smoking status: Current Everyday Smoker -- 0.5 packs/day for 40 years    Types: Cigarettes  . Smokeless tobacco: Never Used  . Alcohol Use: No    Family History Family History  Problem Relation Age of Onset  . Heart disease Mother   . Heart disease Father     Allergies  Allergen Reactions  . Amoxicillin   . Penicillins     REACTION: unspecified    Current Outpatient Prescriptions  Medication Sig Dispense Refill  . ALPRAZolam (XANAX) 0.25 MG tablet Take 0.25 mg by mouth at bedtime as needed.        Marland Kitchen aspirin 325 MG tablet Take 325 mg by mouth daily.        Marland Kitchen buPROPion (ZYBAN) 150 MG 12 hr tablet Take 150 mg by mouth 2 (two) times daily.        . citalopram (CELEXA) 20 MG tablet Take 20 mg by mouth daily.        . clonazePAM (KLONOPIN) 0.5 MG tablet Take 0.5 mg by mouth Nightly.        Marland Kitchen  Dorzolamide HCl-Timolol Mal (COSOPT OP) Apply to eye.        . insulin aspart protamine-insulin aspart (NOVOLOG 70/30) (70-30) 100 UNIT/ML injection Inject 40 Units into the skin daily with breakfast.        . insulin aspart protamine-insulin aspart (NOVOLOG 70/30) (70-30) 100 UNIT/ML injection Inject 25 Units into the skin daily with supper.        . losartan-hydrochlorothiazide (HYZAAR) 100-12.5 MG per tablet Take 1 tablet by mouth daily.        . metFORMIN (GLUCOPHAGE) 500 MG tablet Take 500 mg by mouth 2 (two) times daily with a meal.        . omeprazole (PRILOSEC) 20 MG capsule TAKE ONE CAPSULE BY MOUTH EVERY DAY  90 capsule  0  . pregabalin (LYRICA) 50 MG capsule Take 50 mg by mouth 2 (two) times daily. Each morning and nightly       . simvastatin (ZOCOR) 40 MG tablet TAKE ONE TABLET BY MOUTH DAILY  90 tablet  0    Physical Examination  Filed Vitals:   12/24/11 1400  BP: 151/76  Pulse: 77  Resp: 16    Body mass index is 30.01  kg/(m^2).  General:  WDWN in NAD Gait: Normal HEENT: WNL Eyes: Pupils equal Pulmonary: normal non-labored breathing , without Rales, rhonchi,  wheezing Cardiac: RRR, without  Murmurs, rubs or gallops; No carotid bruits Abdomen: soft, NT, no masses Skin: no rashes, ulcers noted Vascular Exam/Pulses: Patient has audible posterior tibial and dorsalis pedis pulses bilaterally via Doppler. She has 2+ radial pulses bilaterally, popliteal pulses are nonpalpable  Extremities without ischemic changes, no Gangrene , no cellulitis; no open wounds;  Musculoskeletal: no muscle wasting or atrophy  Neurologic: A&O X 3; Appropriate Affect ; SENSATION: normal; MOTOR FUNCTION:  moving all extremities equally. Speech is fluent/normal  Non-Invasive Vascular Imaging: Her right ABI today is 0.67 left 0.60 previously her ABIs in October 2012 the right was 0.89 on the left was 0.92 shows a significant decrease  ASSESSMENT/PLAN: I discussed the ABI exam with the patient she understands that there has been some decrease in blood flow are also elevated velocities at the proximal and distal point of the left stent proximally her velocity is 401 distally is 384 therefore I will recommend she return in 3 months for repeat ABIs and followup with Dr. Trula Slade. She is in agreement with this she knows to contact our office if she has any difficulties sooner  Beatris Ship ANP  Clinic M.D.: Bridgett Larsson

## 2012-01-04 NOTE — Progress Notes (Signed)
UP AND WALKED AND TOLERATED WELL AND RIGHT GROIN STABLE;NO BLEEDING OR HEMATOMA

## 2012-01-04 NOTE — Interval H&P Note (Signed)
History and Physical Interval Note:  01/04/2012 7:21 AM  [name]  has presented today for surgery, with the diagnosis of pvd  The various methods of treatment have been discussed with the patient and family. After consideration of risks, benefits and other options for treatment, the patient has consented to  Procedure(s) (LRB): ABDOMINAL AORTAGRAM (N/A) as a surgical intervention .  The patients' history has been reviewed, patient examined, no change in status, stable for surgery.  I have reviewed the patients' chart and labs.  Questions were answered to the patient's satisfaction.     Quincee Gittens IV, V. WELLS

## 2012-01-04 NOTE — Discharge Instructions (Signed)

## 2012-01-10 DIAGNOSIS — I6509 Occlusion and stenosis of unspecified vertebral artery: Secondary | ICD-10-CM | POA: Diagnosis not present

## 2012-01-10 DIAGNOSIS — F411 Generalized anxiety disorder: Secondary | ICD-10-CM | POA: Diagnosis not present

## 2012-01-10 DIAGNOSIS — G2581 Restless legs syndrome: Secondary | ICD-10-CM | POA: Diagnosis not present

## 2012-01-10 DIAGNOSIS — I671 Cerebral aneurysm, nonruptured: Secondary | ICD-10-CM | POA: Diagnosis not present

## 2012-01-28 DIAGNOSIS — H409 Unspecified glaucoma: Secondary | ICD-10-CM | POA: Diagnosis not present

## 2012-01-28 DIAGNOSIS — H4011X0 Primary open-angle glaucoma, stage unspecified: Secondary | ICD-10-CM | POA: Diagnosis not present

## 2012-01-28 DIAGNOSIS — H2510 Age-related nuclear cataract, unspecified eye: Secondary | ICD-10-CM | POA: Diagnosis not present

## 2012-01-28 DIAGNOSIS — E119 Type 2 diabetes mellitus without complications: Secondary | ICD-10-CM | POA: Diagnosis not present

## 2012-02-04 ENCOUNTER — Encounter: Payer: Self-pay | Admitting: Surgery

## 2012-02-07 ENCOUNTER — Encounter: Payer: Self-pay | Admitting: Surgery

## 2012-02-07 ENCOUNTER — Ambulatory Visit (INDEPENDENT_AMBULATORY_CARE_PROVIDER_SITE_OTHER): Payer: Medicare Other | Admitting: Surgery

## 2012-02-07 ENCOUNTER — Encounter (INDEPENDENT_AMBULATORY_CARE_PROVIDER_SITE_OTHER): Payer: Medicare Other | Admitting: *Deleted

## 2012-02-07 VITALS — BP 155/73 | HR 79 | Temp 98.4°F | Ht 66.0 in | Wt 183.0 lb

## 2012-02-07 DIAGNOSIS — I739 Peripheral vascular disease, unspecified: Secondary | ICD-10-CM | POA: Diagnosis not present

## 2012-02-07 DIAGNOSIS — Z48812 Encounter for surgical aftercare following surgery on the circulatory system: Secondary | ICD-10-CM | POA: Diagnosis not present

## 2012-02-07 DIAGNOSIS — I70219 Atherosclerosis of native arteries of extremities with intermittent claudication, unspecified extremity: Secondary | ICD-10-CM | POA: Diagnosis not present

## 2012-02-07 NOTE — Progress Notes (Signed)
[facility] [place]   Patient name: [name] MRN: [id number] DOB: 02-19-1946 Sex: female     Chief Complaint  Patient presents with  . PVD    1 month f/u- s/p aortogram, bil left runoff and angioplasty    HISTORY OF PRESENT ILLNESS: The patient is back today for followup. She was initially seen in August of 2012 by Dr. Kellie Simmering for bilateral claudication. She underwent angiography which revealed an occluded left superficial femoral and popliteal artery. She underwent successful subintimal recanalization with stent placement using 6 and 7 mm stents. Recently she was found to have a recurrence of her symptoms with elevated velocities. An arteriogram was performed on April 5. In-stent stenosis was treated with angioplasty. She is back today without complaints. Her symptoms have results. She has minimal problems with her right leg  Past Medical History  Diagnosis Date  . Stroke   . Phlebitis     30 years ago  left leg  . Dizziness   . Headache   . Arthritis   . Muscle pain   . Anxiety   . GERD (gastroesophageal reflux disease)   . Shortness of breath   . Wheezing   . Migraines   . Diabetes mellitus   . Hyperlipidemia   . Hypertension   . Leg pain     Past Surgical History  Procedure Date  . Leg pain     with walking  . Cesarean section   . Cholecystectomy   . Knee surgery   . Rotator cuff surgery   . Thyroid surgery   . Hernia repair     times two  . Abdominal hysterectomy 1984  . Femoral artery stent 05/11/11    Left superficial femoral and popliteal artery    History   Social History  . Marital Status: Married    Spouse Name: N/A    Number of Children: N/A  . Years of Education: N/A   Occupational History  . Not on file.   Social History Main Topics  . Smoking status: Current Everyday Smoker -- 0.5 packs/day for 40 years    Types: Cigarettes  . Smokeless tobacco: Never Used   Comment: pt states that she will try to smoke without  any aid  . Alcohol Use: No  . Drug Use: No  . Sexually Active: Not on file   Other Topics Concern  . Not on file   Social History Narrative  . No narrative on file    Family History  Problem Relation Age of Onset  . Heart disease Mother   . Heart disease Father     Allergies as of 02/07/2012 - Review Complete 02/07/2012  Allergen Reaction Noted  . Amoxicillin  05/03/2011  . Penicillins  04/04/2007    Current Outpatient Prescriptions on File Prior to Visit  Medication Sig Dispense Refill  . aspirin 325 MG tablet Take 325 mg by mouth daily.        . citalopram (CELEXA) 20 MG tablet Take 40 mg by mouth daily.       . clonazePAM (KLONOPIN) 0.5 MG tablet Take 0.5 mg by mouth at bedtime.       . Dorzolamide HCl-Timolol Mal (COSOPT OP) Place 1 drop into both eyes at bedtime.       . insulin aspart protamine-insulin aspart (NOVOLOG 70/30) (70-30) 100 UNIT/ML injection Inject 8-25 Units into the skin daily with breakfast. Take 25 units with breakfast and 8 units with supper      .  losartan-hydrochlorothiazide (HYZAAR) 100-12.5 MG per tablet Take 1 tablet by mouth daily.        . metFORMIN (GLUCOPHAGE) 500 MG tablet Take 1,000 mg by mouth 3 (three) times daily before meals.       . simvastatin (ZOCOR) 40 MG tablet Take 40 mg by mouth every evening.      Marland Kitchen buPROPion (WELLBUTRIN SR) 150 MG 12 hr tablet Take 150 mg by mouth 2 (two) times daily.      Marland Kitchen omeprazole (PRILOSEC) 20 MG capsule Take 20 mg by mouth daily.         REVIEW OF SYSTEMS: No change from prior visit  PHYSICAL EXAMINATION:   Vital signs are BP 155/73  Pulse 79  Temp(Src) 98.4 F (36.9 C) (Oral)  Ht 5\' 6"  (1.676 m)  Wt 183 lb (83.008 kg)  BMI 29.54 kg/m2 General: The patient appears their stated age. HEENT:  No gross abnormalities Pulmonary:  Non labored breathing Musculoskeletal: There are no major deformities. Neurologic: No focal weakness or paresthesias are detected, Skin: There are no ulcer or rashes  noted. Psychiatric: The patient has normal affect. Cardiovascular: Palpable left pedal pulse   Diagnostic Studies I have ordered and reviewed her ultrasound.   ABI on the left is 1.0 with triphasic waveforms on the right is 0.8 with monophasic waveforms  Assessment: Peripheral vascular disease status post left leg intervention Plan: The patient has had a dramatic response to balloon angioplasty of her in-stent stenosis. Her left ankle brachial index is now within normal limits and her symptoms have resolved. I did discuss with the patient that since she had a early recurrence of her symptoms and in-stent stenosis that she is likely to have this to occur again. We will need to monitor her very closely to prevent stent occlusion. She will be on our stent protocol which will be an ultrasound at 3 months, 6 months, and one year.  Eldridge Abrahams, M.D. [facility] Office: [phone] Pager:  [phone]

## 2012-02-08 ENCOUNTER — Ambulatory Visit: Payer: Medicare Other | Admitting: Neurosurgery

## 2012-02-18 DIAGNOSIS — H2510 Age-related nuclear cataract, unspecified eye: Secondary | ICD-10-CM | POA: Diagnosis not present

## 2012-02-18 DIAGNOSIS — H4011X0 Primary open-angle glaucoma, stage unspecified: Secondary | ICD-10-CM | POA: Diagnosis not present

## 2012-02-18 DIAGNOSIS — E119 Type 2 diabetes mellitus without complications: Secondary | ICD-10-CM | POA: Diagnosis not present

## 2012-03-27 ENCOUNTER — Ambulatory Visit: Payer: Medicare Other | Admitting: Surgery

## 2012-03-31 DIAGNOSIS — H409 Unspecified glaucoma: Secondary | ICD-10-CM | POA: Diagnosis not present

## 2012-03-31 DIAGNOSIS — H4011X0 Primary open-angle glaucoma, stage unspecified: Secondary | ICD-10-CM | POA: Diagnosis not present

## 2012-03-31 DIAGNOSIS — H2510 Age-related nuclear cataract, unspecified eye: Secondary | ICD-10-CM | POA: Diagnosis not present

## 2012-05-01 DIAGNOSIS — I10 Essential (primary) hypertension: Secondary | ICD-10-CM | POA: Diagnosis not present

## 2012-05-01 DIAGNOSIS — Z79899 Other long term (current) drug therapy: Secondary | ICD-10-CM | POA: Diagnosis not present

## 2012-05-01 DIAGNOSIS — Z111 Encounter for screening for respiratory tuberculosis: Secondary | ICD-10-CM | POA: Diagnosis not present

## 2012-05-01 DIAGNOSIS — E559 Vitamin D deficiency, unspecified: Secondary | ICD-10-CM | POA: Diagnosis not present

## 2012-05-01 DIAGNOSIS — E109 Type 1 diabetes mellitus without complications: Secondary | ICD-10-CM | POA: Diagnosis not present

## 2012-05-01 DIAGNOSIS — E782 Mixed hyperlipidemia: Secondary | ICD-10-CM | POA: Diagnosis not present

## 2012-05-01 DIAGNOSIS — Z1212 Encounter for screening for malignant neoplasm of rectum: Secondary | ICD-10-CM | POA: Diagnosis not present

## 2012-05-05 ENCOUNTER — Encounter: Payer: Self-pay | Admitting: Neurosurgery

## 2012-05-05 HISTORY — PX: DENTAL SURGERY: SHX609

## 2012-05-08 ENCOUNTER — Encounter: Payer: Self-pay | Admitting: Neurosurgery

## 2012-05-08 ENCOUNTER — Encounter (INDEPENDENT_AMBULATORY_CARE_PROVIDER_SITE_OTHER): Payer: Medicare Other | Admitting: *Deleted

## 2012-05-08 ENCOUNTER — Ambulatory Visit (INDEPENDENT_AMBULATORY_CARE_PROVIDER_SITE_OTHER): Payer: Medicare Other | Admitting: Neurosurgery

## 2012-05-08 VITALS — BP 155/83 | HR 69 | Resp 16 | Ht 66.5 in | Wt 182.0 lb

## 2012-05-08 DIAGNOSIS — Z48812 Encounter for surgical aftercare following surgery on the circulatory system: Secondary | ICD-10-CM

## 2012-05-08 DIAGNOSIS — I739 Peripheral vascular disease, unspecified: Secondary | ICD-10-CM

## 2012-05-08 DIAGNOSIS — I70219 Atherosclerosis of native arteries of extremities with intermittent claudication, unspecified extremity: Secondary | ICD-10-CM

## 2012-05-08 DIAGNOSIS — E1351 Other specified diabetes mellitus with diabetic peripheral angiopathy without gangrene: Secondary | ICD-10-CM | POA: Insufficient documentation

## 2012-05-08 NOTE — Progress Notes (Signed)
[facility]/P[facility] Office Note  CC: Three-month lower extremity surveillance Referring Physician: Brabham  History of Present Illness: 66 year old female patient of Dr. Trula Slade who is status post recannulization of occluded superficial femoral and popliteal artery stent that was performed in April 2013. The patient states she is able to walk and exercise but has bilateral knee degeneration and is going to see Dr. Sharol Given for this next week. Otherwise the patient states she is able to walk without severe claudication, she has no rest pain or open ulcerations on her lower extremities.  Past Medical History  Diagnosis Date  . Stroke   . Phlebitis     30 years ago  left leg  . Dizziness   . Headache   . Arthritis   . Muscle pain   . Anxiety   . GERD (gastroesophageal reflux disease)   . Shortness of breath   . Wheezing   . Migraines   . Diabetes mellitus   . Hyperlipidemia   . Hypertension   . Leg pain     ROS: [x]  Positive   [ ]  Denies    General: [ ]  Weight loss, [ ]  Fever, [ ]  chills Neurologic: [ ]  Dizziness, [ ]  Blackouts, [ ]  Seizure [ ]  Stroke, [ ]  "Mini stroke", [ ]  Slurred speech, [ ]  Temporary blindness; [ ]  weakness in arms or legs, [ ]  Hoarseness Cardiac: [ ]  Chest pain/pressure, [ ]  Shortness of breath at rest [ ]  Shortness of breath with exertion, [ ]  Atrial fibrillation or irregular heartbeat Vascular: [ ]  Pain in legs with walking, [ ]  Pain in legs at rest, [ ]  Pain in legs at night,  [ ]  Non-healing ulcer, [ ]  Blood clot in vein/DVT,   Pulmonary: [ ]  Home oxygen, [ ]  Productive cough, [ ]  Coughing up blood, [ ]  Asthma,  [ ]  Wheezing Musculoskeletal:  [ ]  Arthritis, [ ]  Low back pain, [ ]  Joint pain Hematologic: [ ]  Easy Bruising, [ ]  Anemia; [ ]  Hepatitis Gastrointestinal: [ ]  Blood in stool, [ ]  Gastroesophageal Reflux/heartburn, [ ]  Trouble swallowing Urinary: [ ]  chronic Kidney disease, [ ]  on HD - [ ]  MWF or [ ]  TTHS, [ ]  Burning  with urination, [ ]  Difficulty urinating Skin: [ ]  Rashes, [ ]  Wounds Psychological: [ ]  Anxiety, [ ]  Depression   Social History History  Substance Use Topics  . Smoking status: Current Everyday Smoker -- 0.5 packs/day for 40 years    Types: Cigarettes  . Smokeless tobacco: Never Used   Comment: pt states that she will try to smoke without any aid  . Alcohol Use: No    Family History Family History  Problem Relation Age of Onset  . Heart disease Mother   . Heart disease Father     Allergies  Allergen Reactions  . Plavix (Clopidogrel Bisulfate) Palpitations  . Amoxicillin Itching and Swelling  . Penicillins     REACTION: unspecified    Current Outpatient Prescriptions  Medication Sig Dispense Refill  . aspirin 325 MG tablet Take 325 mg by mouth daily.        Marland Kitchen buPROPion (WELLBUTRIN SR) 150 MG 12 hr tablet Take 150 mg by mouth 2 (two) times daily.      . citalopram (CELEXA) 20 MG tablet Take 40 mg by mouth daily.       . clonazePAM (KLONOPIN) 0.5 MG tablet Take 0.5 mg by mouth at bedtime.       Marland Kitchen  Dorzolamide HCl-Timolol Mal (COSOPT OP) Place 1 drop into both eyes at bedtime.       . insulin aspart protamine-insulin aspart (NOVOLOG 70/30) (70-30) 100 UNIT/ML injection Inject 8-25 Units into the skin daily with breakfast. Take 25 units with breakfast and 8 units with supper      . latanoprost (XALATAN) 0.005 % ophthalmic solution 1 drop at bedtime.      Marland Kitchen losartan-hydrochlorothiazide (HYZAAR) 100-12.5 MG per tablet Take 1 tablet by mouth daily.        . metFORMIN (GLUCOPHAGE) 500 MG tablet Take 1,000 mg by mouth 3 (three) times daily before meals.       . Multiple Vitamins-Minerals (CENTRUM PO) Take by mouth 2 (two) times daily.      Marland Kitchen omeprazole (PRILOSEC) 20 MG capsule Take 20 mg by mouth daily.      Marland Kitchen omeprazole (PRILOSEC) 40 MG capsule Take 40 mg by mouth daily.      . simvastatin (ZOCOR) 40 MG tablet Take 40 mg by mouth every evening.        Physical  Examination  Filed Vitals:   05/08/12 1030  BP: 155/83  Pulse: 69  Resp: 16    Body mass index is 28.94 kg/(m^2).  General:  WDWN in NAD Gait: Normal HEENT: WNL Eyes: Pupils equal Pulmonary: normal non-labored breathing , without Rales, rhonchi,  wheezing Cardiac: RRR, without  Murmurs, rubs or gallops; No carotid bruits Abdomen: soft, NT, no masses Skin: no rashes, ulcers noted Vascular Exam/Pulses: Palpable femoral pulses bilaterally, PT and DP pulses are palpable bilaterally  Extremities without ischemic changes, no Gangrene , no cellulitis; no open wounds;  Musculoskeletal: no muscle wasting or atrophy  Neurologic: A&O X 3; Appropriate Affect ; SENSATION: normal; MOTOR FUNCTION:  moving all extremities equally. Speech is fluent/normal  Non-Invasive Vascular Imaging: ABIs today are 0.89 and monophasic to biphasic on the right, 1.01 and biphasic on the left which is unchanged from previous exam, lower extremity duplex does show some elevated velocities at the proximal end of the first stent a 354 cm/s. I reviewed this with Dr. Trula Slade who recommends the patient return in 3 months for repeat duplex and ABIs and be seen by him.  ASSESSMENT/PLAN: Asymptomatic patient with stable ABIs, plan as above, the patient is in agreement with this plan, her questions were encouraged and answered.  Beatris Ship ANP  Clinic M.D.: Trula Slade

## 2012-05-19 DIAGNOSIS — I6509 Occlusion and stenosis of unspecified vertebral artery: Secondary | ICD-10-CM | POA: Diagnosis not present

## 2012-05-19 DIAGNOSIS — F411 Generalized anxiety disorder: Secondary | ICD-10-CM | POA: Diagnosis not present

## 2012-05-19 DIAGNOSIS — G2581 Restless legs syndrome: Secondary | ICD-10-CM | POA: Diagnosis not present

## 2012-05-19 DIAGNOSIS — I671 Cerebral aneurysm, nonruptured: Secondary | ICD-10-CM | POA: Diagnosis not present

## 2012-06-02 DIAGNOSIS — IMO0002 Reserved for concepts with insufficient information to code with codable children: Secondary | ICD-10-CM | POA: Diagnosis not present

## 2012-06-02 DIAGNOSIS — M5137 Other intervertebral disc degeneration, lumbosacral region: Secondary | ICD-10-CM | POA: Diagnosis not present

## 2012-06-02 DIAGNOSIS — M1710 Unilateral primary osteoarthritis, unspecified knee: Secondary | ICD-10-CM | POA: Diagnosis not present

## 2012-06-02 DIAGNOSIS — G5760 Lesion of plantar nerve, unspecified lower limb: Secondary | ICD-10-CM | POA: Diagnosis not present

## 2012-06-26 DIAGNOSIS — M25579 Pain in unspecified ankle and joints of unspecified foot: Secondary | ICD-10-CM | POA: Diagnosis not present

## 2012-06-26 DIAGNOSIS — M1710 Unilateral primary osteoarthritis, unspecified knee: Secondary | ICD-10-CM | POA: Diagnosis not present

## 2012-07-03 DIAGNOSIS — M1710 Unilateral primary osteoarthritis, unspecified knee: Secondary | ICD-10-CM | POA: Diagnosis not present

## 2012-07-10 DIAGNOSIS — M1710 Unilateral primary osteoarthritis, unspecified knee: Secondary | ICD-10-CM | POA: Diagnosis not present

## 2012-07-28 DIAGNOSIS — Z1289 Encounter for screening for malignant neoplasm of other sites: Secondary | ICD-10-CM | POA: Diagnosis not present

## 2012-07-28 DIAGNOSIS — Z1231 Encounter for screening mammogram for malignant neoplasm of breast: Secondary | ICD-10-CM | POA: Diagnosis not present

## 2012-07-31 DIAGNOSIS — Z79899 Other long term (current) drug therapy: Secondary | ICD-10-CM | POA: Diagnosis not present

## 2012-07-31 DIAGNOSIS — E109 Type 1 diabetes mellitus without complications: Secondary | ICD-10-CM | POA: Diagnosis not present

## 2012-07-31 DIAGNOSIS — E559 Vitamin D deficiency, unspecified: Secondary | ICD-10-CM | POA: Diagnosis not present

## 2012-07-31 DIAGNOSIS — E782 Mixed hyperlipidemia: Secondary | ICD-10-CM | POA: Diagnosis not present

## 2012-07-31 DIAGNOSIS — I10 Essential (primary) hypertension: Secondary | ICD-10-CM | POA: Diagnosis not present

## 2012-08-01 ENCOUNTER — Observation Stay (HOSPITAL_COMMUNITY)
Admission: EM | Admit: 2012-08-01 | Discharge: 2012-08-02 | Disposition: A | Discharge status: Home / Self Care | Payer: Medicare Other | Attending: Emergency Medicine | Admitting: Emergency Medicine

## 2012-08-01 ENCOUNTER — Encounter (HOSPITAL_COMMUNITY): Payer: Self-pay | Admitting: *Deleted

## 2012-08-01 ENCOUNTER — Emergency Department (HOSPITAL_COMMUNITY): Payer: Medicare Other

## 2012-08-01 DIAGNOSIS — K219 Gastro-esophageal reflux disease without esophagitis: Secondary | ICD-10-CM | POA: Diagnosis not present

## 2012-08-01 DIAGNOSIS — Z7982 Long term (current) use of aspirin: Secondary | ICD-10-CM | POA: Diagnosis not present

## 2012-08-01 DIAGNOSIS — R0989 Other specified symptoms and signs involving the circulatory and respiratory systems: Secondary | ICD-10-CM | POA: Insufficient documentation

## 2012-08-01 DIAGNOSIS — Z8673 Personal history of transient ischemic attack (TIA), and cerebral infarction without residual deficits: Secondary | ICD-10-CM | POA: Diagnosis not present

## 2012-08-01 DIAGNOSIS — R0609 Other forms of dyspnea: Secondary | ICD-10-CM | POA: Diagnosis not present

## 2012-08-01 DIAGNOSIS — E785 Hyperlipidemia, unspecified: Secondary | ICD-10-CM | POA: Diagnosis not present

## 2012-08-01 DIAGNOSIS — R002 Palpitations: Principal | ICD-10-CM | POA: Insufficient documentation

## 2012-08-01 DIAGNOSIS — E119 Type 2 diabetes mellitus without complications: Secondary | ICD-10-CM | POA: Diagnosis not present

## 2012-08-01 DIAGNOSIS — F17200 Nicotine dependence, unspecified, uncomplicated: Secondary | ICD-10-CM | POA: Insufficient documentation

## 2012-08-01 DIAGNOSIS — F411 Generalized anxiety disorder: Secondary | ICD-10-CM | POA: Insufficient documentation

## 2012-08-01 DIAGNOSIS — M129 Arthropathy, unspecified: Secondary | ICD-10-CM | POA: Diagnosis not present

## 2012-08-01 DIAGNOSIS — Z8709 Personal history of other diseases of the respiratory system: Secondary | ICD-10-CM | POA: Diagnosis not present

## 2012-08-01 DIAGNOSIS — I10 Essential (primary) hypertension: Secondary | ICD-10-CM | POA: Diagnosis not present

## 2012-08-01 DIAGNOSIS — R0789 Other chest pain: Secondary | ICD-10-CM | POA: Diagnosis not present

## 2012-08-01 DIAGNOSIS — R0602 Shortness of breath: Secondary | ICD-10-CM | POA: Insufficient documentation

## 2012-08-01 DIAGNOSIS — Z79899 Other long term (current) drug therapy: Secondary | ICD-10-CM | POA: Diagnosis not present

## 2012-08-01 DIAGNOSIS — R0600 Dyspnea, unspecified: Secondary | ICD-10-CM

## 2012-08-01 LAB — CBC WITH DIFFERENTIAL/PLATELET
Hemoglobin: 12.5 g/dL (ref 12.0–15.0)
Lymphocytes Relative: 40 % (ref 12–46)
Lymphs Abs: 4.4 10*3/uL — ABNORMAL HIGH (ref 0.7–4.0)
MCV: 86.1 fL (ref 78.0–100.0)
Neutrophils Relative %: 53 % (ref 43–77)
Platelets: 377 10*3/uL (ref 150–400)
RBC: 4.45 MIL/uL (ref 3.87–5.11)
WBC: 11 10*3/uL — ABNORMAL HIGH (ref 4.0–10.5)

## 2012-08-01 LAB — BASIC METABOLIC PANEL
CO2: 27 mEq/L (ref 19–32)
Glucose, Bld: 272 mg/dL — ABNORMAL HIGH (ref 70–99)
Potassium: 4 mEq/L (ref 3.5–5.1)
Sodium: 134 mEq/L — ABNORMAL LOW (ref 135–145)

## 2012-08-01 LAB — POCT I-STAT TROPONIN I: Troponin i, poc: 0.01 ng/mL (ref 0.00–0.08)

## 2012-08-01 MED ORDER — METFORMIN HCL ER 500 MG PO TB24
1000.0000 mg | ORAL_TABLET | Freq: Two times a day (BID) | ORAL | Status: DC
  Filled 2012-08-01 (×4): qty 2

## 2012-08-01 MED ORDER — CLONAZEPAM 0.5 MG PO TABS: 0.5000 mg | ORAL_TABLET | Freq: Every day | ORAL | Status: DC

## 2012-08-01 MED ORDER — HYDROCHLOROTHIAZIDE 25 MG PO TABS
25.0000 mg | ORAL_TABLET | Freq: Every day | ORAL | Status: DC
  Filled 2012-08-01: qty 1

## 2012-08-01 MED ORDER — LOSARTAN POTASSIUM-HCTZ 100-25 MG PO TABS: 1.0000 | ORAL_TABLET | Freq: Every day | ORAL | Status: DC

## 2012-08-01 MED ORDER — CITALOPRAM HYDROBROMIDE 10 MG PO TABS: 10.0000 mg | ORAL_TABLET | Freq: Every morning | ORAL | Status: DC

## 2012-08-01 MED ORDER — SIMVASTATIN 40 MG PO TABS
40.0000 mg | ORAL_TABLET | Freq: Every day | ORAL | Status: DC
  Filled 2012-08-01 (×2): qty 1

## 2012-08-01 MED ORDER — LOSARTAN POTASSIUM 50 MG PO TABS
100.0000 mg | ORAL_TABLET | Freq: Every day | ORAL | Status: DC
  Filled 2012-08-01 (×2): qty 2

## 2012-08-01 MED ORDER — INSULIN ASPART PROT & ASPART (70-30 MIX) 100 UNIT/ML ~~LOC~~ SUSP
8.0000 [IU] | Freq: Two times a day (BID) | SUBCUTANEOUS | Status: DC
  Filled 2012-08-01: qty 3

## 2012-08-01 NOTE — ED Provider Notes (Signed)
History     CSN: [id number]  Arrival date & time 08/01/12  1745   First MD Initiated Contact with Patient 08/01/12 1844      Chief Complaint  Patient presents with  . Shortness of Breath  . Palpitations    (Consider location/radiation/quality/duration/timing/severity/associated sxs/prior treatment) HPI Comments: The patient has had intermittent shortness of breath and palpitations for the past month. She says they have been bothering her daily lasting a few minutes at a time but today she felt her heart throbbing similar to her prior palpitations that lasted several hours. She contacted her primary care doctor who had R. he scheduled a stress test for tomorrow but instructed her to come to the ER for evaluation due to her symptoms worsening. She has no known history of coronary artery disease, and she had a negative stress test 10 or 12 years ago per patient. She does have diabetes, high blood pressure, peripheral arterial disease as risk factors and has a significant family history for cardiac sudden death in her mother and 2 brothers in their early 58s. She has also experienced a number of environmental stressors and anxiety because of the slightly including the death of her godson.  Patient is a 66 y.o. female presenting with shortness of breath and palpitations. The history is provided by the patient and the spouse. No language interpreter was used.  Shortness of Breath  The current episode started more than 2 weeks ago. The onset was gradual. The problem occurs frequently. The problem has been gradually worsening. The problem is moderate. Nothing relieves the symptoms. Nothing aggravates the symptoms. Associated symptoms include shortness of breath. Pertinent negatives include no chest pain, no chest pressure, no orthopnea, no fever, no rhinorrhea, no stridor, no cough and no wheezing. There was no intake of a foreign body. The Heimlich maneuver was not attempted. She has had no prior  steroid use. She has had no prior hospitalizations. She has had no prior ICU admissions. Her past medical history does not include asthma or past wheezing. She has been behaving normally. Urine output has been normal. The last void occurred less than 6 hours ago. There were no sick contacts. She has received no recent medical care.  Palpitations  This is a new problem. The current episode started more than 1 week ago. The problem occurs daily. The problem has been gradually worsening. The problem is associated with anxiety and stress. Associated symptoms include irregular heartbeat (heart pounding) and shortness of breath. Pertinent negatives include no diaphoresis, no fever, no malaise/fatigue, no chest pain, no chest pressure, no exertional chest pressure, no orthopnea, no abdominal pain, no nausea, no vomiting, no headaches, no back pain, no weakness and no cough. She has tried nothing for the symptoms. The treatment provided no relief. Risk factors include family history, diabetes mellitus, a sedentary lifestyle, post menopause and stress.    Past Medical History  Diagnosis Date  . Phlebitis     30 years ago  left leg  . Dizziness   . Headache   . Arthritis   . Muscle pain   . Anxiety   . GERD (gastroesophageal reflux disease)   . Shortness of breath   . Wheezing   . Migraines   . Diabetes mellitus   . Hyperlipidemia   . Hypertension   . Leg pain   . Stroke     multiple mini strokes ( brain aneurysm )  . Brain aneurysm 2006    Past Surgical History  Procedure Date  .  Leg pain     with walking  . Cesarean section   . Cholecystectomy   . Knee surgery   . Rotator cuff surgery   . Thyroid surgery   . Hernia repair     times two  . Abdominal hysterectomy 1984  . Femoral artery stent 05/11/11    Left superficial femoral and popliteal artery  . Dental surgery Aug. 16, 2013    left lower     Family History  Problem Relation Age of Onset  . Heart disease Mother   . Heart  disease Father     History  Substance Use Topics  . Smoking status: Current Every Day Smoker -- 0.5 packs/day for 40 years    Types: Cigarettes  . Smokeless tobacco: Never Used     Comment: pt states that she will try to smoke without any aid  . Alcohol Use: No    OB History    Grav Para Term Preterm Abortions TAB SAB Ect Mult Living                  Review of Systems  Constitutional: Negative for fever, chills, malaise/fatigue, diaphoresis, activity change and appetite change.  HENT: Negative for congestion, rhinorrhea, neck pain, neck stiffness and sinus pressure.   Eyes: Negative for discharge and visual disturbance.  Respiratory: Positive for shortness of breath. Negative for cough, chest tightness, wheezing and stridor.   Cardiovascular: Positive for palpitations (heart pounding). Negative for chest pain, orthopnea and leg swelling.  Gastrointestinal: Negative for nausea, vomiting, abdominal pain, diarrhea and abdominal distention.  Genitourinary: Negative for decreased urine volume and difficulty urinating.  Musculoskeletal: Negative for back pain and arthralgias.  Skin: Negative for color change and pallor.  Neurological: Negative for weakness, light-headedness and headaches.  Psychiatric/Behavioral: Negative for behavioral problems and agitation.  All other systems reviewed and are negative.    Allergies  Plavix; Amoxicillin; and Penicillins  Home Medications   Current Outpatient Rx  Name  Route  Sig  Dispense  Refill  . ASPIRIN 325 MG PO TABS   Oral   Take 325 mg by mouth daily.           . BUPROPION HCL ER (SR) 150 MG PO TB12   Oral   Take 150 mg by mouth 2 (two) times daily.         Marland Kitchen CITALOPRAM HYDROBROMIDE 20 MG PO TABS   Oral   Take 40 mg by mouth daily.          Marland Kitchen CLONAZEPAM 0.5 MG PO TABS   Oral   Take 0.5 mg by mouth at bedtime.          . COSOPT OP   Both Eyes   Place 1 drop into both eyes at bedtime.          . INSULIN ASPART  PROT & ASPART (70-30) 100 UNIT/ML [place] SUSP   Subcutaneous   Inject 8-25 Units into the skin daily with breakfast. Take 25 units with breakfast and 8 units with supper         . LATANOPROST 0.005 % OP SOLN      1 drop at bedtime.         Marland Kitchen LOSARTAN POTASSIUM-HCTZ 100-12.5 MG PO TABS   Oral   Take 1 tablet by mouth daily.           Marland Kitchen METFORMIN HCL 500 MG PO TABS   Oral   Take 1,000 mg by mouth 2 (two)  times daily.          . CENTRUM PO   Oral   Take by mouth 2 (two) times daily.         Marland Kitchen OMEPRAZOLE 20 MG PO CPDR   Oral   Take 20 mg by mouth daily.         Marland Kitchen OMEPRAZOLE 40 MG PO CPDR   Oral   Take 40 mg by mouth daily.         Marland Kitchen SIMVASTATIN 40 MG PO TABS   Oral   Take 40 mg by mouth every evening.           BP 159/66  Pulse 92  Temp 98.5 F (36.9 C) (Oral)  Resp 24  SpO2 96%  Physical Exam  Nursing note and vitals reviewed. Constitutional: She is oriented to person, place, and time. She appears well-developed and well-nourished. No distress.  HENT:  Head: Normocephalic and atraumatic.  Mouth/Throat: No oropharyngeal exudate.  Eyes: EOM are normal. Pupils are equal, round, and reactive to light. Right eye exhibits no discharge. Left eye exhibits no discharge.  Neck: Normal range of motion. Neck supple. No JVD present.  Cardiovascular: Normal rate, regular rhythm and normal heart sounds.   Pulmonary/Chest: Effort normal and breath sounds normal. No stridor. No respiratory distress. She exhibits no tenderness.  Abdominal: Soft. Bowel sounds are normal. She exhibits no distension. There is no tenderness. There is no guarding.  Musculoskeletal: Normal range of motion. She exhibits no edema and no tenderness.  Neurological: She is alert and oriented to person, place, and time. No cranial nerve deficit. She exhibits normal muscle tone.  Skin: Skin is warm and dry. No rash noted. She is not diaphoretic.  Psychiatric: She has a normal mood and affect. Her  behavior is normal. Judgment and thought content normal.    ED Course  Procedures (including critical care time)  Labs Reviewed  CBC WITH DIFFERENTIAL - Abnormal; Notable for the following:    WBC 11.0 (*)     Lymphs Abs 4.4 (*)     All other components within normal limits  BASIC METABOLIC PANEL - Abnormal; Notable for the following:    Sodium 134 (*)     Glucose, Bld 272 (*)     GFR calc non Af Amer 63 (*)     GFR calc Af Amer 73 (*)     All other components within normal limits  POCT I-STAT TROPONIN I   Dg Chest 2 View  08/01/2012  *RADIOLOGY REPORT*  Clinical Data: Shortness of breath, chest tightness and palpitations, smoking history  CHEST - 2 VIEW  Comparison: Chest x-ray of 05/16/2011  Findings: No active infiltrate or effusion is seen.  A calcified granuloma in the right lung base laterally is stable.  Mediastinal contours appear stable.  The heart is within normal limits in size. No bony abnormality is seen.  IMPRESSION: Stable chest x-ray.  No active lung disease.   Original Report Authenticated By: Ivar Drape, M.D.      1. Palpitations   2. Dyspnea      Date: 08/01/2012  Rate: 92  Rhythm: normal sinus rhythm  QRS Axis: normal  Intervals: normal  ST/T Wave abnormalities: normal  Conduction Disutrbances: none  Narrative Interpretation: nml  Old EKG Reviewed: none   MDM  7:06 PM will plan for admit CP protocol, ACS r/o based on hx and risk factors. 1st Tn neg, EKG nml.  Sent to holding in stable condition, CP  free        Verdie Shire, MD 08/01/12 2138

## 2012-08-01 NOTE — ED Notes (Signed)
[facility] unable to take this pt at present.  They are full

## 2012-08-01 NOTE — ED Notes (Signed)
Pt states "this is the best I have felt"

## 2012-08-01 NOTE — ED Notes (Signed)
Pt states "not at the moment I am not" in response to shortness of breath

## 2012-08-01 NOTE — ED Notes (Signed)
Pt states took losartan-HCTZ this morning in a joint pill; Etta Quill, NP at bedside and aware that pt took joint pill.

## 2012-08-01 NOTE — ED Notes (Signed)
meds ordered from pharmacy.

## 2012-08-01 NOTE — ED Provider Notes (Signed)
66 year old female has been having episodes of her heart racing and pounding. They're generally fairly short lived-only lasting about 1-2 minutes. Today she had a prolonged episode. There was some associated tightness in her chest and some mild dyspnea there did not have any nausea, vomiting, diaphoresis. Activity level is severely restricted because of severe peripheral vascular disease, but symptoms did not appear to be exertional. She has a strong family history of coronary artery disease. She been scheduled for a stress test tomorrow, but I doubt that she would be able to walk on a treadmill sufficient to get an adequate stress test. She also is not a good candidate for coronary CT scan because of her known severe atherosclerotic disease making for MI given that she would have normal coronary arteries. I feel she should be admitted for cardiology consultation to get the most appropriate form of cardiac evaluation.  ECG shows normal sinus rhythm with a rate of 92, no ectopy. Normal axis. Normal P wave. Normal QRS. Normal intervals. Normal ST and T waves. Impression: normal ECG. No prior ECG available for comparison.   I saw and evaluated the patient, reviewed the resident's note and I agree with the findings and plan.   Delora Fuel, MD [id number]

## 2012-08-01 NOTE — ED Notes (Signed)
Pt alert no  Distress.  She is still waiting for a bed in [facility]

## 2012-08-01 NOTE — ED Notes (Signed)
The pt has taken her own medicine.  She took metformin klonopin simvastin and 10 units of novolog insulin

## 2012-08-01 NOTE — ED Notes (Signed)
The pt has no pain she is hungry and she has a high blood sugar because she has not been able to eat and she cannot take her night time meds.  She last ate at 1030am today

## 2012-08-01 NOTE — ED Notes (Signed)
Meal given

## 2012-08-01 NOTE — ED Notes (Signed)
Pt is scheduled to have stress test tomorrow.  Pt is now here with increasing sob and pounding of heart beat.  Pt appears weak.

## 2012-08-01 NOTE — ED Notes (Signed)
The pts glucose is 207.  The ed res has okd that the pt take her own medication she has at bedside

## 2012-08-01 NOTE — ED Provider Notes (Signed)
Patient placed in CDU under chest pain protocol.  Patient currently resting comfortably in bed.  No return of presenting symptoms.  Patient awake, alert.  Lungs CTA bilaterally.  S1/S2, RRR.  Monitor reveals NSR without ectopy.  Abdomen soft, bowel sounds present.  Strong distal pulses palpated all extremities.  12 lead reviewed, no indication of ischemia.  Cardiac markers negative.  Patient is scheduled for coronary CTA in AM.  Treatment and diagnostic plan discussed with patient.  Norman Herrlich, NP 08/01/12 [phone]

## 2012-08-02 ENCOUNTER — Ambulatory Visit (HOSPITAL_COMMUNITY): Payer: Medicare Other

## 2012-08-02 ENCOUNTER — Encounter: Payer: Self-pay | Admitting: Cardiology

## 2012-08-02 ENCOUNTER — Ambulatory Visit (INDEPENDENT_AMBULATORY_CARE_PROVIDER_SITE_OTHER): Payer: Medicare Other | Admitting: Cardiology

## 2012-08-02 VITALS — BP 127/68 | HR 83 | Ht 66.0 in | Wt 177.8 lb

## 2012-08-02 DIAGNOSIS — R0602 Shortness of breath: Secondary | ICD-10-CM | POA: Diagnosis not present

## 2012-08-02 LAB — TROPONIN I: Troponin I: <0.3 ng/mL (ref <0.30)

## 2012-08-02 LAB — T4, FREE: Free T4: 1.04 ng/dL (ref 0.80–1.80)

## 2012-08-02 LAB — TSH: TSH: 2.356 u[IU]/mL (ref 0.350–4.500)

## 2012-08-02 MED ORDER — METOPROLOL TARTRATE 25 MG PO TABS
25.0000 mg | ORAL_TABLET | Freq: Once | ORAL | Status: AC
  Administered 2012-08-02: 25 mg via ORAL
  Filled 2012-08-02: qty 1

## 2012-08-02 NOTE — ED Notes (Signed)
Family at bedside. 

## 2012-08-02 NOTE — ED Provider Notes (Signed)
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 Patient was signed out to me by Dr. Marnette Flegal.  8:22 AM Patient reevaluated, and denies any complaints at this time. She tells me that she was scheduled for a stress test this afternoon with [facility] cardiology. Was told last night and that she was a poor candidate for stress test, and therefore is awaiting CTA.  PE: Gen: A&O x4 HEENT: PERRL, EOM CHEST: RRR, no m/r/g LUNGS: CTAB, no w/r/r ABD: BS x 4, ND/NT EXT: No edema, strong peripheral pulses NEURO: Sensation and strength intact bilaterally  Plan: Obtain CT results and discharge negative. Consulting cardiology.  9:39 AM Spoke with Dr. Janet Berlin with Radiology, who tells me that coronary CT is in an appropriate test for this patient , without stress test results. Therefore, I consult [place] cardiology, Dr. Martinique, who tells me that if patient is asymptomatic at this time, the patient may be discharged to home and can followup with [facility] cardiology today. The patient has an appointment already scheduled at 3:45 PM today. I have also discussed this patient with Dr. Christy Gentles, who agrees that if the patient is asymptomatic she may be discharged to followup with cardiology today.  The patient is asymptomatic at this time. She denies any complaints whatsoever. She states that she will keep her appointment with cardiology. Patient is agreeable with this plan. Patient is stable and ready for discharge.  Montine Circle, PA-C 08/02/12 [phone]

## 2012-08-02 NOTE — Patient Instructions (Addendum)
The current medical regimen is effective;  continue present plan and medications.  Your physician has requested that you have a dobutamine echocardiogram. For further information please visit [email or web address]. Please follow instruction sheet as given.  Follow up will be based on results of testing.

## 2012-08-02 NOTE — ED Notes (Signed)
BMI 28.9

## 2012-08-02 NOTE — ED Notes (Signed)
Pt is alert.  No distress.  NSR on the monitor.  Awaiting stress angio in am and is aware of NPO.  Denies cocaine use, copd, or asthma

## 2012-08-02 NOTE — ED Provider Notes (Signed)
Medical screening examination/treatment/procedure(s) were conducted as a shared visit with non-physician practitioner(s) and myself.  I personally evaluated the patient during the encounter   Delora Fuel, MD [id number]

## 2012-08-02 NOTE — ED Notes (Signed)
PA at the bedside.

## 2012-08-02 NOTE — Progress Notes (Signed)
HPI The patient presents for evaluation of palpitations. She was just in the emergency room today. She says her symptoms actually started 2 or 3 weeks ago she thinks as she came off Celexa. However, she recently was started on Nexium and she believes she developed symptoms following this they were worse on Tuesday. She felt her heart racing. He said it was going fast and just would not stop. She did have some shortness of breath with it. She did not describe any chest pressure, neck or arm discomfort. She did not have any presyncope or syncope. It was finally suggested that she go to the emergency room. I reviewed these records and she ruled out for myocardial infarction. TSH was normal. By the time EKG was obtained he was normal sinus rhythm. It was suggested that the patient could be discharged from the emergency room for followup here.  The patient has not had similar symptoms. She is active but she doesn't exercise. She is limited by leg pain with an extensive history of peripheral vascular disease. She's not been getting any new shortness of breath, PND or orthopnea other than the dyspnea as mentioned. She's not been getting any chest pressure, neck or arm discomfort.  She denies any orthostatic symptoms, fevers chills or cough.   Allergies  Allergen Reactions  . Plavix (Clopidogrel Bisulfate) Palpitations  . Amoxicillin Itching and Swelling    FACE & EYES SWELL  . Penicillins Other (See Comments)    REACTION: unspecified    Current Outpatient Prescriptions  Medication Sig Dispense Refill  . aspirin 325 MG tablet Take 325 mg by mouth every morning.       . citalopram (CELEXA) 20 MG tablet Take 10 mg by mouth every morning.       . clonazePAM (KLONOPIN) 0.5 MG tablet Take 0.5 mg by mouth at bedtime.       . insulin aspart protamine-insulin aspart (NOVOLOG 70/30) (70-30) 100 UNIT/ML injection Inject 8-25 Units into the skin 2 (two) times daily with a meal. Take 25 units with breakfast and  8 units with supper      . latanoprost (XALATAN) 0.005 % ophthalmic solution Place 1 drop into both eyes every morning.       Marland Kitchen losartan-hydrochlorothiazide (HYZAAR) 100-25 MG per tablet Take 1 tablet by mouth daily.      . metFORMIN (GLUMETZA) 500 MG (MOD) 24 hr tablet Take 1,000 mg by mouth 2 (two) times daily with a meal.      . omeprazole (PRILOSEC) 20 MG capsule Take 20 mg by mouth 2 (two) times daily with breakfast and lunch.       . simvastatin (ZOCOR) 40 MG tablet Take 40 mg by mouth at bedtime.        No current facility-administered medications for this visit.   Facility-Administered Medications Ordered in Other Visits  Medication Dose Route Frequency Provider Last Rate Last Dose  . [COMPLETED] metoprolol tartrate (LOPRESSOR) tablet 25 mg  25 mg Oral Once Teressa Lower, MD   25 mg at 08/02/12 0514  . [DISCONTINUED] citalopram (CELEXA) tablet 10 mg  10 mg Oral q morning - 10a Verdie Shire, MD      . [DISCONTINUED] clonazePAM Bobbye Charleston) tablet 0.5 mg  0.5 mg Oral QHS Verdie Shire, MD      . [DISCONTINUED] hydrochlorothiazide (HYDRODIURIL) tablet 25 mg  25 mg Oral Daily Delora Fuel, MD      . [DISCONTINUED] insulin aspart protamine-insulin aspart (NOVOLOG 70/30) injection 8-25 Units  8-25 Units  Subcutaneous BID WC Verdie Shire, MD      . [DISCONTINUED] losartan (COZAAR) tablet 100 mg  100 mg Oral Daily Delora Fuel, MD      . [DISCONTINUED] losartan-hydrochlorothiazide (HYZAAR) 100-25 MG per tablet 1 tablet  1 tablet Oral Daily Verdie Shire, MD      . [DISCONTINUED] metFORMIN (GLUCOPHAGE-XR) 24 hr tablet 1,000 mg  1,000 mg Oral BID WC Verdie Shire, MD      . [DISCONTINUED] simvastatin (ZOCOR) tablet 40 mg  40 mg Oral QHS Verdie Shire, MD        Past Medical History  Diagnosis Date  . Phlebitis     30 years ago  left leg  . Dizziness   . Headache   . Arthritis   . Muscle pain   . Anxiety   . GERD (gastroesophageal reflux disease)   . Shortness of breath   . Wheezing     . Migraines   . Diabetes mellitus   . Hyperlipidemia   . Hypertension   . Leg pain   . Stroke     multiple mini strokes ( brain aneurysm )  . Brain aneurysm 2006    Past Surgical History  Procedure Date  . Cesarean section   . Cholecystectomy   . Knee surgery   . Rotator cuff surgery   . Thyroid surgery   . Hernia repair     times two  . Abdominal hysterectomy 1984  . Femoral artery stent 05/11/11    Left superficial femoral and popliteal artery  . Dental surgery Aug. 16, 2013    left lower     Family History  Problem Relation Age of Onset  . CAD Mother 16    Died of MI  . CAD Father 23    Died of MI  . CAD Brother 18    Two brothers died of MI    History   Social History  . Marital Status: Married    Spouse Name: N/A    Number of Children: N/A  . Years of Education: N/A   Occupational History  . Not on file.   Social History Main Topics  . Smoking status: Current Every Day Smoker -- 0.5 packs/day for 40 years    Types: Cigarettes  . Smokeless tobacco: Never Used     Comment: pt states that she will try to smoke without any aid  . Alcohol Use: No  . Drug Use: No  . Sexually Active: Not on file   Other Topics Concern  . Not on file   Social History Narrative   Lives with husband.      ROS:  Positive for vertigo, joint pains. Otherwise as stated in the HPI and negative for all other systems.  PHYSICAL EXAM BP 127/68  Pulse 83  Ht 5\' 6"  (1.676 m)  Wt 177 lb 12.8 oz (80.65 kg)  BMI 28.70 kg/m2 GENERAL:  Well appearing HEENT:  Pupils equal round and reactive, fundi not visualized, oral mucosa unremarkable NECK:  No jugular venous distention, waveform within normal limits, carotid upstroke brisk and symmetric, no bruits, no thyromegaly LYMPHATICS:  No cervical, inguinal adenopathy LUNGS:  Clear to auscultation bilaterally BACK:  No CVA tenderness CHEST:  Unremarkable HEART:  PMI not displaced or sustained,S1 and S2 within normal limits, no S3,  no S4, no clicks, no rubs, no murmurs ABD:  Flat, positive bowel sounds normal in frequency in pitch, no bruits, no rebound, no guarding, no midline pulsatile mass, no hepatomegaly, no  splenomegaly EXT:  2 plus pulses throughout, no edema, no cyanosis no clubbing, left femoral bruit.  SKIN:  No rashes no nodules NEURO:  Cranial nerves II through XII grossly intact, motor grossly intact throughout PSYCH:  Cognitively intact, oriented to person place and time   EKG:  Sinus rhythm, rate 73, axis within normal limits, intervals within normal limits, no acute ST-T wave changes.   ASSESSMENT AND PLAN   Palpitations I agree that these could be related to change in her medications particularly the Celexa. She has restarted this. She had normal labs including thyroid study. At this point I would not suggest further cardiovascular testing unless she has recurrent symptoms. I reviewed this at length with her and her husband.  Shortness of breath Given this and her extensive family history screening with a stress test is indicated. She would not be a little walk on a treadmill so she will have a dobutamine echocardiogram.  Hypertension Her blood pressure is well controlled. She will continue the meds as listed.  Dyslipidemia Per Alesia Richards, MD

## 2012-08-02 NOTE — ED Notes (Signed)
12 lead done

## 2012-08-03 ENCOUNTER — Encounter: Payer: Self-pay | Admitting: *Deleted

## 2012-08-03 ENCOUNTER — Telehealth: Payer: Self-pay | Admitting: Cardiology

## 2012-08-03 NOTE — Telephone Encounter (Signed)
Work note faxed

## 2012-08-03 NOTE — Telephone Encounter (Signed)
New Problem:    Patient called in needing a note releasing her to go to work to [employer] 08/04/12 faxed in to [employer] [phone] [employer] [place].  Please call back.

## 2012-08-04 ENCOUNTER — Encounter: Payer: Self-pay | Admitting: Surgery

## 2012-08-04 NOTE — ED Provider Notes (Signed)
Medical screening examination/treatment/procedure(s) were performed by non-physician practitioner and as supervising physician I was immediately available for consultation/collaboration.   Sharyon Cable, MD 08/04/12 Rogene Houston

## 2012-08-07 ENCOUNTER — Encounter (HOSPITAL_COMMUNITY): Payer: Self-pay | Admitting: Pharmacy Technician

## 2012-08-07 ENCOUNTER — Encounter: Payer: Self-pay | Admitting: Surgery

## 2012-08-07 ENCOUNTER — Encounter (INDEPENDENT_AMBULATORY_CARE_PROVIDER_SITE_OTHER): Payer: Medicare Other | Admitting: *Deleted

## 2012-08-07 ENCOUNTER — Ambulatory Visit (INDEPENDENT_AMBULATORY_CARE_PROVIDER_SITE_OTHER): Payer: Medicare Other | Admitting: Surgery

## 2012-08-07 VITALS — BP 161/63 | HR 75 | Resp 18 | Ht 66.0 in | Wt 181.0 lb

## 2012-08-07 DIAGNOSIS — I739 Peripheral vascular disease, unspecified: Secondary | ICD-10-CM | POA: Diagnosis not present

## 2012-08-07 DIAGNOSIS — Z48812 Encounter for surgical aftercare following surgery on the circulatory system: Secondary | ICD-10-CM

## 2012-08-07 DIAGNOSIS — I70219 Atherosclerosis of native arteries of extremities with intermittent claudication, unspecified extremity: Secondary | ICD-10-CM

## 2012-08-07 NOTE — Progress Notes (Signed)
[facility] [place]   Patient name: [name] MRN: [id number] DOB: 1946-06-24 Sex: female     Chief Complaint  Patient presents with  . Follow-up    Pt has had cortisone and Euflexxa injections in both knees, pain is a little better.   Pt was seen at [facility] ED for heart palps, will have stress test.     HISTORY OF PRESENT ILLNESS: The patient is here today for followup. In August of 2012 she underwent subintimal recanalization with subsequent stent placement. The distal stent was a 6 mm Cook and the proximal stents were 7 mm Cook. She developed early recurrence and April of 2013 she underwent angioplasty with a 6 mm balloon. She has had a good response to angioplasty. She is back today for followup. She does not report claudication. She denies ulceration.   Past Medical History  Diagnosis Date  . Phlebitis     30 years ago  left leg  . Dizziness   . Headache   . Arthritis   . Muscle pain   . Anxiety   . GERD (gastroesophageal reflux disease)   . Shortness of breath   . Wheezing   . Migraines   . Diabetes mellitus   . Hyperlipidemia   . Hypertension   . Leg pain   . Stroke     multiple mini strokes ( brain aneurysm )  . Brain aneurysm 2006    Past Surgical History  Procedure Date  . Cesarean section   . Cholecystectomy   . Knee surgery   . Rotator cuff surgery   . Thyroid surgery   . Hernia repair     times two  . Abdominal hysterectomy 1984  . Femoral artery stent 05/11/11    Left superficial femoral and popliteal artery  . Dental surgery Aug. 16, 2013    left lower     History   Social History  . Marital Status: Married    Spouse Name: N/A    Number of Children: N/A  . Years of Education: N/A   Occupational History  . Not on file.   Social History Main Topics  . Smoking status: Current Every Day Smoker -- 0.5 packs/day for 40 years    Types: Cigarettes  . Smokeless tobacco: Never Used     Comment: pt states that she  will try to smoke without any aid  . Alcohol Use: No  . Drug Use: No  . Sexually Active: Not on file   Other Topics Concern  . Not on file   Social History Narrative   Lives with husband.      Family History  Problem Relation Age of Onset  . CAD Mother 8    Died of MI  . CAD Father 90    Died of MI  . CAD Brother 48    Two brothers died of MI    Allergies as of Aug 23, 2012 - Review Complete 23-Aug-2012  Allergen Reaction Noted  . Plavix (clopidogrel bisulfate) Palpitations 05/08/2012  . Amoxicillin Itching and Swelling 05/03/2011  . Penicillins Other (See Comments) 04/04/2007    Current Outpatient Prescriptions on File Prior to Visit  Medication Sig Dispense Refill  . aspirin 325 MG tablet Take 325 mg by mouth every morning.       . citalopram (CELEXA) 20 MG tablet Take 10 mg by mouth every morning.       . clonazePAM (KLONOPIN) 0.5 MG tablet Take 0.5 mg by mouth at  bedtime.       . insulin aspart protamine-insulin aspart (NOVOLOG 70/30) (70-30) 100 UNIT/ML injection Inject 8-25 Units into the skin 2 (two) times daily with a meal. Take 25 units with breakfast and 8 units with supper      . latanoprost (XALATAN) 0.005 % ophthalmic solution Place 1 drop into both eyes every morning.       Marland Kitchen losartan-hydrochlorothiazide (HYZAAR) 100-25 MG per tablet Take 1 tablet by mouth daily.      . metFORMIN (GLUMETZA) 500 MG (MOD) 24 hr tablet Take 1,000 mg by mouth 2 (two) times daily with a meal.      . omeprazole (PRILOSEC) 20 MG capsule Take 20 mg by mouth 2 (two) times daily with breakfast and lunch.       . simvastatin (ZOCOR) 40 MG tablet Take 40 mg by mouth at bedtime.          REVIEW OF SYSTEMS: Positive for palpitations. All other systems are negative as documented by the patient and the encounter form.   PHYSICAL EXAMINATION:   Vital signs are BP 161/63  Pulse 75  Resp 18  Ht 5\' 6"  (1.676 m)  Wt 181 lb (82.101 kg)  BMI 29.21 kg/m2  SpO2 99% General: The patient  appears their stated age. HEENT:  No gross abnormalities Pulmonary:  Non labored breathing Musculoskeletal: There are no major deformities. Neurologic: No focal weakness or paresthesias are detected, Skin: There are no ulcer or rashes noted. Psychiatric: The patient has normal affect. Cardiovascular: pedal pulses are not palpable    Diagnostic Studies  ultrasound was ordered and reviewed. This shows a decrease in her ABI down to 0.71 on the left. There is diffuse in-stent stenosis on the left leg.  Assessment:  status post left lower extremity revascularization  Plan: I had an extensive conversation with the patient regarding our treatment options. Since she has gone approximately 6 months without the need of intervention, and without symptom recurrence, I feel it is appropriate to proceed with a repeat intervention. The patient understands that she is at risk for this recurring again. At a later date, I will consider placing covered stents. Her procedure has been scheduled for Tuesday, November 26. I will access the right groin and perform balloon angioplasty as needed on the left leg. In addition, since she is allergic to Plavix, I am going to try and get her and rolled in the  [place] study.   Eldridge Abrahams, M.D. [facility] [place] Office: [phone] Pager:  [phone]

## 2012-08-09 ENCOUNTER — Other Ambulatory Visit: Payer: Self-pay

## 2012-08-15 ENCOUNTER — Ambulatory Visit (HOSPITAL_COMMUNITY)
Admission: RE | Admit: 2012-08-15 | Discharge: 2012-08-15 | Disposition: A | Discharge status: Home / Self Care | Payer: Medicare Other | Source: Ambulatory Visit | Attending: Surgery | Admitting: Surgery

## 2012-08-15 ENCOUNTER — Encounter (HOSPITAL_COMMUNITY)
Admission: RE | Disposition: A | Discharge status: Home / Self Care | Payer: Self-pay | Source: Ambulatory Visit | Attending: Surgery

## 2012-08-15 ENCOUNTER — Telehealth: Payer: Self-pay | Admitting: Surgery

## 2012-08-15 DIAGNOSIS — E119 Type 2 diabetes mellitus without complications: Secondary | ICD-10-CM | POA: Insufficient documentation

## 2012-08-15 DIAGNOSIS — M129 Arthropathy, unspecified: Secondary | ICD-10-CM | POA: Diagnosis not present

## 2012-08-15 DIAGNOSIS — Z8673 Personal history of transient ischemic attack (TIA), and cerebral infarction without residual deficits: Secondary | ICD-10-CM | POA: Insufficient documentation

## 2012-08-15 DIAGNOSIS — F411 Generalized anxiety disorder: Secondary | ICD-10-CM | POA: Insufficient documentation

## 2012-08-15 DIAGNOSIS — E785 Hyperlipidemia, unspecified: Secondary | ICD-10-CM | POA: Diagnosis not present

## 2012-08-15 DIAGNOSIS — F17200 Nicotine dependence, unspecified, uncomplicated: Secondary | ICD-10-CM | POA: Insufficient documentation

## 2012-08-15 DIAGNOSIS — T82898A Other specified complication of vascular prosthetic devices, implants and grafts, initial encounter: Secondary | ICD-10-CM | POA: Insufficient documentation

## 2012-08-15 DIAGNOSIS — Z7982 Long term (current) use of aspirin: Secondary | ICD-10-CM | POA: Diagnosis not present

## 2012-08-15 DIAGNOSIS — K219 Gastro-esophageal reflux disease without esophagitis: Secondary | ICD-10-CM | POA: Diagnosis not present

## 2012-08-15 DIAGNOSIS — G43909 Migraine, unspecified, not intractable, without status migrainosus: Secondary | ICD-10-CM | POA: Diagnosis not present

## 2012-08-15 DIAGNOSIS — I70219 Atherosclerosis of native arteries of extremities with intermittent claudication, unspecified extremity: Secondary | ICD-10-CM | POA: Diagnosis not present

## 2012-08-15 DIAGNOSIS — I10 Essential (primary) hypertension: Secondary | ICD-10-CM | POA: Diagnosis not present

## 2012-08-15 DIAGNOSIS — Z79899 Other long term (current) drug therapy: Secondary | ICD-10-CM | POA: Diagnosis not present

## 2012-08-15 DIAGNOSIS — Y831 Surgical operation with implant of artificial internal device as the cause of abnormal reaction of the patient, or of later complication, without mention of misadventure at the time of the procedure: Secondary | ICD-10-CM | POA: Insufficient documentation

## 2012-08-15 HISTORY — PX: ABDOMINAL AORTAGRAM: SHX5454

## 2012-08-15 HISTORY — PX: LOWER EXTREMITY ANGIOGRAM: SHX5508

## 2012-08-15 LAB — POCT I-STAT, CHEM 8
BUN: 24 mg/dL — ABNORMAL HIGH (ref 6–23)
Chloride: 103 mEq/L (ref 96–112)
Creatinine, Ser: 1.2 mg/dL — ABNORMAL HIGH (ref 0.50–1.10)
Glucose, Bld: 314 mg/dL — ABNORMAL HIGH (ref 70–99)
Potassium: 4.2 mEq/L (ref 3.5–5.1)
Sodium: 141 mEq/L (ref 135–145)

## 2012-08-15 LAB — POCT ACTIVATED CLOTTING TIME: Activated Clotting Time: 159 seconds

## 2012-08-15 LAB — GLUCOSE, CAPILLARY: Glucose-Capillary: 169 mg/dL — ABNORMAL HIGH (ref 70–99)

## 2012-08-15 SURGERY — ABDOMINAL AORTAGRAM
Anesthesia: LOCAL

## 2012-08-15 MED ORDER — HEPARIN SODIUM (PORCINE) 1000 UNIT/ML IJ SOLN
INTRAMUSCULAR | Status: AC
  Filled 2012-08-15: qty 1

## 2012-08-15 MED ORDER — HYDRALAZINE HCL 20 MG/ML IJ SOLN
10.0000 mg | INTRAMUSCULAR | Status: DC | PRN
Start: 2012-08-15 — End: 2012-08-15

## 2012-08-15 MED ORDER — ACETAMINOPHEN 325 MG PO TABS: 325.0000 mg | ORAL_TABLET | ORAL | Status: DC | PRN

## 2012-08-15 MED ORDER — PHENOL 1.4 % MT LIQD: 1.0000 | OROMUCOSAL | Status: DC | PRN

## 2012-08-15 MED ORDER — LIDOCAINE HCL (PF) 1 % IJ SOLN
INTRAMUSCULAR | Status: AC
  Filled 2012-08-15: qty 30

## 2012-08-15 MED ORDER — SODIUM CHLORIDE 0.9 % IV SOLN: 1.0000 mL/kg/h | INTRAVENOUS | Status: DC

## 2012-08-15 MED ORDER — MIDAZOLAM HCL 2 MG/2ML IJ SOLN
INTRAMUSCULAR | Status: AC
  Filled 2012-08-15: qty 2

## 2012-08-15 MED ORDER — SODIUM CHLORIDE 0.9 % IV SOLN
INTRAVENOUS | Status: DC
  Administered 2012-08-15: 1000 mL via INTRAVENOUS

## 2012-08-15 MED ORDER — FENTANYL CITRATE 0.05 MG/ML IJ SOLN
INTRAMUSCULAR | Status: AC
  Filled 2012-08-15: qty 2

## 2012-08-15 MED ORDER — ONDANSETRON HCL 4 MG/2ML IJ SOLN: 4.0000 mg | Freq: Four times a day (QID) | INTRAMUSCULAR | Status: DC | PRN

## 2012-08-15 MED ORDER — ALUM & MAG HYDROXIDE-SIMETH 200-200-20 MG/5ML PO SUSP: 15.0000 mL | ORAL | Status: DC | PRN

## 2012-08-15 MED ORDER — INSULIN ASPART 100 UNIT/ML ~~LOC~~ SOLN
8.0000 [IU] | Freq: Once | SUBCUTANEOUS | Status: AC
  Administered 2012-08-15: 8 [IU] via SUBCUTANEOUS

## 2012-08-15 MED ORDER — ACETAMINOPHEN 325 MG RE SUPP: 325.0000 mg | RECTAL | Status: DC | PRN

## 2012-08-15 MED ORDER — GUAIFENESIN-DM 100-10 MG/5ML PO SYRP: 15.0000 mL | ORAL_SOLUTION | ORAL | Status: DC | PRN

## 2012-08-15 MED ORDER — LABETALOL HCL 5 MG/ML IV SOLN: 10.0000 mg | INTRAVENOUS | Status: DC | PRN

## 2012-08-15 MED ORDER — METOPROLOL TARTRATE 1 MG/ML IV SOLN: 2.0000 mg | INTRAVENOUS | Status: DC | PRN

## 2012-08-15 NOTE — Progress Notes (Signed)
Assumed care of pt from Anette Guarneri, RN. See assessment.

## 2012-08-15 NOTE — Progress Notes (Signed)
0745 CBG phoned to Rennis Harding, RN

## 2012-08-15 NOTE — H&P (View-Only) (Signed)
[facility] [place]   Patient name: [name] MRN: [id number] DOB: May 12, 1946 Sex: female     Chief Complaint  Patient presents with  . Follow-up    Pt has had cortisone and Euflexxa injections in both knees, pain is a little better.   Pt was seen at [facility] ED for heart palps, will have stress test.     HISTORY OF PRESENT ILLNESS: The patient is here today for followup. In August of 2012 she underwent subintimal recanalization with subsequent stent placement. The distal stent was a 6 mm Cook and the proximal stents were 7 mm Cook. She developed early recurrence and April of 2013 she underwent angioplasty with a 6 mm balloon. She has had a good response to angioplasty. She is back today for followup. She does not report claudication. She denies ulceration.   Past Medical History  Diagnosis Date  . Phlebitis     30 years ago  left leg  . Dizziness   . Headache   . Arthritis   . Muscle pain   . Anxiety   . GERD (gastroesophageal reflux disease)   . Shortness of breath   . Wheezing   . Migraines   . Diabetes mellitus   . Hyperlipidemia   . Hypertension   . Leg pain   . Stroke     multiple mini strokes ( brain aneurysm )  . Brain aneurysm 2006    Past Surgical History  Procedure Date  . Cesarean section   . Cholecystectomy   . Knee surgery   . Rotator cuff surgery   . Thyroid surgery   . Hernia repair     times two  . Abdominal hysterectomy 1984  . Femoral artery stent 05/11/11    Left superficial femoral and popliteal artery  . Dental surgery Aug. 16, 2013    left lower     History   Social History  . Marital Status: Married    Spouse Name: N/A    Number of Children: N/A  . Years of Education: N/A   Occupational History  . Not on file.   Social History Main Topics  . Smoking status: Current Every Day Smoker -- 0.5 packs/day for 40 years    Types: Cigarettes  . Smokeless tobacco: Never Used     Comment: pt states that she  will try to smoke without any aid  . Alcohol Use: No  . Drug Use: No  . Sexually Active: Not on file   Other Topics Concern  . Not on file   Social History Narrative   Lives with husband.      Family History  Problem Relation Age of Onset  . CAD Mother 44    Died of MI  . CAD Father 8    Died of MI  . CAD Brother 36    Two brothers died of MI    Allergies as of Aug 24, 2012 - Review Complete 2012/08/24  Allergen Reaction Noted  . Plavix (clopidogrel bisulfate) Palpitations 05/08/2012  . Amoxicillin Itching and Swelling 05/03/2011  . Penicillins Other (See Comments) 04/04/2007    Current Outpatient Prescriptions on File Prior to Visit  Medication Sig Dispense Refill  . aspirin 325 MG tablet Take 325 mg by mouth every morning.       . citalopram (CELEXA) 20 MG tablet Take 10 mg by mouth every morning.       . clonazePAM (KLONOPIN) 0.5 MG tablet Take 0.5 mg by mouth at  bedtime.       . insulin aspart protamine-insulin aspart (NOVOLOG 70/30) (70-30) 100 UNIT/ML injection Inject 8-25 Units into the skin 2 (two) times daily with a meal. Take 25 units with breakfast and 8 units with supper      . latanoprost (XALATAN) 0.005 % ophthalmic solution Place 1 drop into both eyes every morning.       Marland Kitchen losartan-hydrochlorothiazide (HYZAAR) 100-25 MG per tablet Take 1 tablet by mouth daily.      . metFORMIN (GLUMETZA) 500 MG (MOD) 24 hr tablet Take 1,000 mg by mouth 2 (two) times daily with a meal.      . omeprazole (PRILOSEC) 20 MG capsule Take 20 mg by mouth 2 (two) times daily with breakfast and lunch.       . simvastatin (ZOCOR) 40 MG tablet Take 40 mg by mouth at bedtime.          REVIEW OF SYSTEMS: Positive for palpitations. All other systems are negative as documented by the patient and the encounter form.   PHYSICAL EXAMINATION:   Vital signs are BP 161/63  Pulse 75  Resp 18  Ht 5\' 6"  (1.676 m)  Wt 181 lb (82.101 kg)  BMI 29.21 kg/m2  SpO2 99% General: The patient  appears their stated age. HEENT:  No gross abnormalities Pulmonary:  Non labored breathing Musculoskeletal: There are no major deformities. Neurologic: No focal weakness or paresthesias are detected, Skin: There are no ulcer or rashes noted. Psychiatric: The patient has normal affect. Cardiovascular: pedal pulses are not palpable    Diagnostic Studies  ultrasound was ordered and reviewed. This shows a decrease in her ABI down to 0.71 on the left. There is diffuse in-stent stenosis on the left leg.  Assessment:  status post left lower extremity revascularization  Plan: I had an extensive conversation with the patient regarding our treatment options. Since she has gone approximately 6 months without the need of intervention, and without symptom recurrence, I feel it is appropriate to proceed with a repeat intervention. The patient understands that she is at risk for this recurring again. At a later date, I will consider placing covered stents. Her procedure has been scheduled for Tuesday, November 26. I will access the right groin and perform balloon angioplasty as needed on the left leg. In addition, since she is allergic to Plavix, I am going to try and get her and rolled in the  [place] study.   Eldridge Abrahams, M.D. [facility] [place] Office: [phone] Pager:  [phone]

## 2012-08-15 NOTE — Telephone Encounter (Signed)
Message copied by Berniece Salines on Tue Aug 15, 2012 12:43 PM ------      Message from: Alfonso Patten      Created: Tue Aug 15, 2012 12:25 PM                   ----- Message -----         From: Serafina Mitchell, MD         Sent: 08/15/2012  10:29 AM           To: Patrici Ranks, Alfonso Patten, RN            08/15/2012:                   1.  ultrasound access, right femoral artery       2.  abdominal aortogram       3.  left lower extremity runoff       4.  third order catheterization       5.  angioplasty, left superficial femoral and popliteal artery            Have the patient followup with [facility] in one month.  she will need to be placed back on surveillance protocol

## 2012-08-15 NOTE — Op Note (Signed)
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  ultrasound access, right femoral artery  2.  abdominal aortogram  3.  left lower extremity runoff  4.  third order catheterization  5.  angioplasty, left superficial femoral and popliteal artery    Indications:  The patient is previously undergone revascularization of her left leg. She has developed recurrent stenosis within her stent. She comes in today for further evaluation and possible intervention.  Procedure:  The patient was identified in the holding area and taken to room 8.  The patient was then placed supine on the table and prepped and draped in the usual sterile fashion.  A time out was called.  Ultrasound was used to evaluate the right common femoral artery.  It was patent .  A digital ultrasound image was acquired.  A micropuncture needle was used to access the right common femoral artery under ultrasound guidance.  An 018 wire was advanced without resistance and a micropuncture sheath was placed.  The 018 wire was removed and a benson wire was placed.  The micropuncture sheath was exchanged for a 5 french sheath.  An omniflush catheter was advanced over the wire to the level of L-1.  An abdominal angiogram was obtained.  Next, using the omniflush catheter and a benson wire, the aortic bifurcation was crossed and the catheter was placed into theleft external iliac artery and left runoff was obtained.   Findings:   Aortogram:  The visualized portions of the suprarenal abdominal aorta showed no significant disease. 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 The right renal artery is widely patent. The infrarenal abdominal aorta is widely patent however there is ulceration, creating a  small area of narrowing in the distal abdominal aorta which is not flow-limiting. Bilateral common and external iliac arteries are widely patent.    Left Lower Extremity:  The left common femoral artery is widely patent. Left profunda femoral artery is widely patent. The stents within the left superficial femoral and popliteal artery showed diffuse in-stent stenosis with several high-grade lesions, greater than 80%. The popliteal artery below the knee is widely patent, and there is three-vessel runoff.  Intervention:  After the above images were obtained, the decision was made to proceed with intervention. The patient was fully heparinized. A 035 wire was advanced across the stenosis without difficulty. I then proceeded with primary balloon angioplasty of the left superficial femoral and popliteal artery stents. A 6 x 150 balloon (Mustang) was taken to grade pressure and held for 1 minute. A completion arteriogram revealed significantly improved blood flow through the stents were several areas of focal dissection. I then changed out for a 6 x 1 50 Cordis balloon. This time the balloon was taken to nominal pressure and held up for 1 minute throughout the entire length of the stents. A followup arteriogram revealed improved blood flow through the stents with significantly decreased evidence of dissection. After these results were obtained, the decision was made to terminate the procedure. The catheters and wires were removed. The patient was taken to the holding area for a sheath pull once the coagulation profile corrects.  Impression:  #1  successful angioplasty of in-stent stenosis within the left superficial femoral and popliteal artery using a 6 mm balloon.  Theotis Burrow, M.D. [facility] Office: [phone] Pager:  [phone]

## 2012-08-15 NOTE — Interval H&P Note (Signed)
History and Physical Interval Note:  08/15/2012 9:26 AM  [name]  has presented today for surgery, with the diagnosis of End stent stenosis  The various methods of treatment have been discussed with the patient and family. After consideration of risks, benefits and other options for treatment, the patient has consented to  Procedure(s) (LRB) with comments: ABDOMINAL AORTAGRAM (N/A) as a surgical intervention .  The patient's history has been reviewed, patient examined, no change in status, stable for surgery.  I have reviewed the patient's chart and labs.  Questions were answered to the patient's satisfaction.     Habiba Treloar IV, V. WELLS

## 2012-08-25 ENCOUNTER — Other Ambulatory Visit (HOSPITAL_COMMUNITY): Payer: Self-pay | Admitting: *Deleted

## 2012-08-25 ENCOUNTER — Ambulatory Visit (HOSPITAL_COMMUNITY): Payer: Medicare Other | Attending: Cardiology

## 2012-08-25 ENCOUNTER — Ambulatory Visit (HOSPITAL_COMMUNITY): Payer: Medicare Other | Attending: Cardiology | Admitting: Radiology

## 2012-08-25 DIAGNOSIS — E785 Hyperlipidemia, unspecified: Secondary | ICD-10-CM | POA: Diagnosis not present

## 2012-08-25 DIAGNOSIS — R0989 Other specified symptoms and signs involving the circulatory and respiratory systems: Secondary | ICD-10-CM | POA: Diagnosis not present

## 2012-08-25 DIAGNOSIS — I739 Peripheral vascular disease, unspecified: Secondary | ICD-10-CM | POA: Insufficient documentation

## 2012-08-25 DIAGNOSIS — F411 Generalized anxiety disorder: Secondary | ICD-10-CM | POA: Diagnosis not present

## 2012-08-25 DIAGNOSIS — R0609 Other forms of dyspnea: Secondary | ICD-10-CM | POA: Diagnosis not present

## 2012-08-25 DIAGNOSIS — Z87891 Personal history of nicotine dependence: Secondary | ICD-10-CM | POA: Diagnosis not present

## 2012-08-25 DIAGNOSIS — I671 Cerebral aneurysm, nonruptured: Secondary | ICD-10-CM | POA: Insufficient documentation

## 2012-08-25 DIAGNOSIS — E119 Type 2 diabetes mellitus without complications: Secondary | ICD-10-CM | POA: Insufficient documentation

## 2012-08-25 DIAGNOSIS — R0602 Shortness of breath: Secondary | ICD-10-CM

## 2012-08-25 DIAGNOSIS — R42 Dizziness and giddiness: Secondary | ICD-10-CM | POA: Diagnosis not present

## 2012-08-25 DIAGNOSIS — I10 Essential (primary) hypertension: Secondary | ICD-10-CM | POA: Diagnosis not present

## 2012-08-25 MED ORDER — SODIUM CHLORIDE 0.9 % IV SOLN
40.0000 ug/kg | Freq: Once | INTRAVENOUS | Status: AC
  Administered 2012-08-25: 40 ug/kg/min via INTRAVENOUS

## 2012-08-25 NOTE — Progress Notes (Signed)
Echocardiogram performed.  

## 2012-09-05 ENCOUNTER — Ambulatory Visit: Payer: Medicare Other | Admitting: Neurosurgery

## 2012-09-06 ENCOUNTER — Telehealth: Payer: Self-pay | Admitting: Cardiology

## 2012-09-06 NOTE — Telephone Encounter (Signed)
Pt notified of normal stress test.

## 2012-09-06 NOTE — Telephone Encounter (Signed)
Pt rtn call to pam re Stress test results , pls call

## 2012-09-22 ENCOUNTER — Encounter: Payer: Self-pay | Admitting: Neurosurgery

## 2012-09-25 ENCOUNTER — Encounter: Payer: Self-pay | Admitting: Neurosurgery

## 2012-09-25 ENCOUNTER — Ambulatory Visit (INDEPENDENT_AMBULATORY_CARE_PROVIDER_SITE_OTHER): Payer: Medicare Other | Admitting: Neurosurgery

## 2012-09-25 VITALS — BP 143/67 | HR 76 | Resp 16 | Ht 66.0 in | Wt 184.0 lb

## 2012-09-25 DIAGNOSIS — I739 Peripheral vascular disease, unspecified: Secondary | ICD-10-CM

## 2012-09-25 DIAGNOSIS — Z48812 Encounter for surgical aftercare following surgery on the circulatory system: Secondary | ICD-10-CM

## 2012-09-25 NOTE — Progress Notes (Signed)
[facility] P[facility]/PVD Office Note  CC: PAD surveillance with postop followup Referring Physician: Brabham  History of Present Illness: 67 year old female patient of Dr. Trula Slade who is status post: Procedure Performed:  1. ultrasound access, right femoral artery  2. abdominal aortogram  3. left lower extremity runoff  4. third order catheterization  5. angioplasty, left superficial femoral and popliteal artery The patient denies any claudication or rest pain. The patient's groin wound is well healed there is no redness or drainage and the patient has no complaints.  Past Medical History  Diagnosis Date  . Phlebitis     30 years ago  left leg  . Dizziness   . Headache   . Arthritis   . Muscle pain   . Anxiety   . GERD (gastroesophageal reflux disease)   . Shortness of breath   . Wheezing   . Migraines   . Diabetes mellitus   . Hyperlipidemia   . Hypertension   . Leg pain   . Stroke     multiple mini strokes ( brain aneurysm )  . Brain aneurysm 2006  . Aneurysm 2013    Right Brain     ROS: [x]  Positive   [ ]  Denies    General: [ ]  Weight loss, [ ]  Fever, [ ]  chills Neurologic: [ ]  Dizziness, [ ]  Blackouts, [ ]  Seizure [ ]  Stroke, [ ]  "Mini stroke", [ ]  Slurred speech, [ ]  Temporary blindness; [ ]  weakness in arms or legs, [ ]  Hoarseness Cardiac: [ ]  Chest pain/pressure, [ ]  Shortness of breath at rest [ ]  Shortness of breath with exertion, [ ]  Atrial fibrillation or irregular heartbeat Vascular: [ ]  Pain in legs with walking, [ ]  Pain in legs at rest, [ ]  Pain in legs at night,  [ ]  Non-healing ulcer, [ ]  Blood clot in vein/DVT,   Pulmonary: [ ]  Home oxygen, [ ]  Productive cough, [ ]  Coughing up blood, [ ]  Asthma,  [ ]  Wheezing Musculoskeletal:  [ ]  Arthritis, [ ]  Low back pain, [ ]  Joint pain Hematologic: [ ]  Easy Bruising, [ ]  Anemia; [ ]  Hepatitis Gastrointestinal: [ ]  Blood in stool, [ ]  Gastroesophageal Reflux/heartburn, [ ]  Trouble  swallowing Urinary: [ ]  chronic Kidney disease, [ ]  on HD - [ ]  MWF or [ ]  TTHS, [ ]  Burning with urination, [ ]  Difficulty urinating Skin: [ ]  Rashes, [ ]  Wounds Psychological: [ ]  Anxiety, [ ]  Depression   Social History History  Substance Use Topics  . Smoking status: Current Every Day Smoker -- 0.5 packs/day for 40 years    Types: Cigarettes  . Smokeless tobacco: Never Used     Comment: pt states that she will try to smoke without any aid  . Alcohol Use: No    Family History Family History  Problem Relation Age of Onset  . CAD Mother 4    Died of MI  . CAD Father 24    Died of MI  . CAD Brother 68    Two brothers died of MI    Allergies  Allergen Reactions  . Plavix (Clopidogrel Bisulfate) Palpitations  . Amoxicillin Itching and Swelling    FACE & EYES SWELL  . Penicillins Other (See Comments)    REACTION: unspecified    Current Outpatient Prescriptions  Medication Sig Dispense Refill  . aspirin 325 MG tablet Take 325 mg by mouth every morning.       . citalopram (CELEXA) 20  MG tablet Take 10 mg by mouth every morning.       . clonazePAM (KLONOPIN) 0.5 MG tablet Take 0.5 mg by mouth at bedtime.       . insulin aspart protamine-insulin aspart (NOVOLOG 70/30) (70-30) 100 UNIT/ML injection Inject 8-25 Units into the skin 2 (two) times daily with a meal. Take 25 units with breakfast and 8 units with supper      . latanoprost (XALATAN) 0.005 % ophthalmic solution Place 1 drop into both eyes every morning.       Marland Kitchen losartan-hydrochlorothiazide (HYZAAR) 100-25 MG per tablet Take 1 tablet by mouth daily.      . metFORMIN (GLUMETZA) 500 MG (MOD) 24 hr tablet Take 1,000 mg by mouth 2 (two) times daily with a meal.      . omeprazole (PRILOSEC) 20 MG capsule Take 20 mg by mouth 2 (two) times daily with breakfast and lunch.       . simvastatin (ZOCOR) 40 MG tablet Take 40 mg by mouth at bedtime.         Physical Examination  Filed Vitals:   09/25/12 1052  BP: 143/67    Pulse: 76  Resp: 16    Body mass index is 29.70 kg/(m^2).  General:  WDWN in NAD Gait: Normal HEENT: WNL Eyes: Pupils equal Pulmonary: normal non-labored breathing , without Rales, rhonchi,  wheezing Cardiac: RRR, without  Murmurs, rubs or gallops; No carotid bruits Abdomen: soft, NT, no masses Skin: no rashes, ulcers noted Vascular Exam/Pulses: Palpable femoral pulses bilaterally with palpable PT and DP bilaterally  Extremities without ischemic changes, no Gangrene , no cellulitis; no open wounds;  Musculoskeletal: no muscle wasting or atrophy  Neurologic: A&O X 3; Appropriate Affect ; SENSATION: normal; MOTOR FUNCTION:  moving all extremities equally. Speech is fluent/normal  Non-Invasive Vascular Imaging: None  ASSESSMENT/PLAN: Asymptomatic patient who is status post angioplasty of left superficial femoral popliteal artery 08/15/2012. The patient will followup in 3 months with repeat ABIs. The patient's questions were encouraged and answered, she is in agreement with this plan.  Beatris Ship ANP  Clinic M.D.: Kellie Simmering

## 2012-09-25 NOTE — Addendum Note (Signed)
Addended by: Thresa Ross C on: 09/25/2012 11:34 AM   Modules accepted: Orders

## 2012-09-27 DIAGNOSIS — Z23 Encounter for immunization: Secondary | ICD-10-CM | POA: Diagnosis not present

## 2012-10-23 ENCOUNTER — Encounter: Payer: Self-pay | Admitting: Cardiology

## 2012-10-23 ENCOUNTER — Encounter: Payer: Self-pay | Admitting: Internal Medicine

## 2012-10-25 DIAGNOSIS — H8120 Vestibular neuronitis, unspecified ear: Secondary | ICD-10-CM | POA: Diagnosis not present

## 2012-12-11 DIAGNOSIS — I6509 Occlusion and stenosis of unspecified vertebral artery: Secondary | ICD-10-CM | POA: Diagnosis not present

## 2012-12-11 DIAGNOSIS — I671 Cerebral aneurysm, nonruptured: Secondary | ICD-10-CM | POA: Diagnosis not present

## 2012-12-11 DIAGNOSIS — F411 Generalized anxiety disorder: Secondary | ICD-10-CM | POA: Diagnosis not present

## 2012-12-13 ENCOUNTER — Other Ambulatory Visit: Payer: Self-pay | Admitting: Neurology

## 2012-12-13 DIAGNOSIS — I671 Cerebral aneurysm, nonruptured: Secondary | ICD-10-CM

## 2012-12-15 ENCOUNTER — Telehealth: Payer: Self-pay

## 2012-12-15 NOTE — Telephone Encounter (Signed)
Phone call from pt.  C/o burning/ tingling in both feet, and "it feels like 1000 pins and needles are sticking in them."   Also c/o swelling and pain in both knees and upper legs; states "I feel like I am putting a lot of stress on my upper legs, because my knees hurt so much to walk".  Relates hx. 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 Sharol Given.  Also, c/o "sharp pain" in right great toe and 2nd toe, and "some discomfort" in (L) 3rd and 4th toe.  Denies any open sores.  Currently has appt. with Dr. Trula Slade 01/22/13, and asking about seeing him sooner.  Stated she called her PCP this morning, and was told to call Dr. Trula Slade.   Advised pt. There are no openings currently for Dr. Trula Slade, and that we will put pt. On a wait list in case of cancellations.  Also advised to report knee pain to Dr. Sharol Given, as well as notifying PCP of ongoing burning pain in feet.  Verb. Understanding.

## 2012-12-18 NOTE — Telephone Encounter (Signed)
I placed this patient on the wait list for an earlier appt w/ [facility].awt

## 2012-12-25 ENCOUNTER — Ambulatory Visit: Payer: Medicare Other | Admitting: Surgery

## 2012-12-25 ENCOUNTER — Ambulatory Visit
Admission: RE | Admit: 2012-12-25 | Discharge: 2012-12-25 | Disposition: A | Discharge status: Home / Self Care | Payer: Medicare Other | Source: Ambulatory Visit | Attending: Neurology | Admitting: Neurology

## 2012-12-25 DIAGNOSIS — I672 Cerebral atherosclerosis: Secondary | ICD-10-CM | POA: Diagnosis not present

## 2012-12-25 DIAGNOSIS — I671 Cerebral aneurysm, nonruptured: Secondary | ICD-10-CM

## 2012-12-25 MED ORDER — GADOBENATE DIMEGLUMINE 529 MG/ML IV SOLN
17.0000 mL | Freq: Once | INTRAVENOUS | Status: AC | PRN
  Administered 2012-12-25: 17 mL via INTRAVENOUS

## 2013-01-11 DIAGNOSIS — E782 Mixed hyperlipidemia: Secondary | ICD-10-CM | POA: Diagnosis not present

## 2013-01-11 DIAGNOSIS — I10 Essential (primary) hypertension: Secondary | ICD-10-CM | POA: Diagnosis not present

## 2013-01-11 DIAGNOSIS — E559 Vitamin D deficiency, unspecified: Secondary | ICD-10-CM | POA: Diagnosis not present

## 2013-01-11 DIAGNOSIS — R5383 Other fatigue: Secondary | ICD-10-CM | POA: Diagnosis not present

## 2013-01-11 DIAGNOSIS — E109 Type 1 diabetes mellitus without complications: Secondary | ICD-10-CM | POA: Diagnosis not present

## 2013-01-11 DIAGNOSIS — E538 Deficiency of other specified B group vitamins: Secondary | ICD-10-CM | POA: Diagnosis not present

## 2013-01-11 DIAGNOSIS — Z79899 Other long term (current) drug therapy: Secondary | ICD-10-CM | POA: Diagnosis not present

## 2013-01-11 DIAGNOSIS — E119 Type 2 diabetes mellitus without complications: Secondary | ICD-10-CM | POA: Diagnosis not present

## 2013-01-11 DIAGNOSIS — R5381 Other malaise: Secondary | ICD-10-CM | POA: Diagnosis not present

## 2013-01-11 DIAGNOSIS — D649 Anemia, unspecified: Secondary | ICD-10-CM | POA: Diagnosis not present

## 2013-01-16 DIAGNOSIS — Z1212 Encounter for screening for malignant neoplasm of rectum: Secondary | ICD-10-CM | POA: Diagnosis not present

## 2013-01-19 ENCOUNTER — Encounter: Payer: Self-pay | Admitting: Surgery

## 2013-01-22 ENCOUNTER — Encounter (INDEPENDENT_AMBULATORY_CARE_PROVIDER_SITE_OTHER): Payer: Medicare Other | Admitting: *Deleted

## 2013-01-22 ENCOUNTER — Ambulatory Visit (INDEPENDENT_AMBULATORY_CARE_PROVIDER_SITE_OTHER): Payer: Medicare Other | Admitting: Surgery

## 2013-01-22 ENCOUNTER — Encounter: Payer: Self-pay | Admitting: Surgery

## 2013-01-22 ENCOUNTER — Other Ambulatory Visit: Payer: Self-pay

## 2013-01-22 VITALS — BP 157/58 | HR 67 | Ht 66.0 in | Wt 185.1 lb

## 2013-01-22 DIAGNOSIS — Z48812 Encounter for surgical aftercare following surgery on the circulatory system: Secondary | ICD-10-CM

## 2013-01-22 DIAGNOSIS — I739 Peripheral vascular disease, unspecified: Secondary | ICD-10-CM | POA: Diagnosis not present

## 2013-01-22 DIAGNOSIS — I70219 Atherosclerosis of native arteries of extremities with intermittent claudication, unspecified extremity: Secondary | ICD-10-CM | POA: Diagnosis not present

## 2013-01-22 MED ORDER — CILOSTAZOL 100 MG PO TABS: 100.0000 mg | ORAL_TABLET | Freq: Two times a day (BID) | ORAL | Status: DC

## 2013-01-22 NOTE — Progress Notes (Signed)
[facility]   Patient name: [name] MRN: [id number] DOB: 1945/11/29 Sex: female    No chief complaint on file.   HISTORY OF PRESENT ILLNESS: The patient comes back today for followup of her claudication. She originally underwent stenting of her left superficial femoral artery in September of 2012. This alleviated her symptoms initially. 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 She is not really aware of what causes or relieves her pain symptoms. She states that when she walks she gets pain in her knees up to her hips.  Past Medical History  Diagnosis Date  . Phlebitis     30 years ago  left leg  . Dizziness   . Headache   . Arthritis   . Muscle pain   . Anxiety   . GERD (gastroesophageal reflux disease)   . Shortness of breath   . Wheezing   . Migraines   . Diabetes mellitus   . Hyperlipidemia   . Hypertension   . Leg pain   . Stroke     multiple mini strokes ( brain aneurysm )  . Brain aneurysm 2006  . 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 Cesarean section    . Cholecystectomy    . Knee surgery    . Rotator cuff surgery    . Thyroid surgery    . Hernia repair      times two  . Abdominal hysterectomy  1984  . Femoral artery stent  05/11/11    Left superficial femoral and popliteal artery  . Dental surgery  Aug. 16, 2013    left lower   . Femoral-popliteal bypass graft  Nov. 26, 2013    Left Angiogram-     History   Social History  . Marital Status: Married    Spouse Name: N/A    Number of Children: N/A  . Years of Education: N/A   Occupational History  . Not on file.   Social History Main Topics  . Smoking status: Current Every Day Smoker -- 0.50 packs/day for 40 years    Types: Cigarettes  . Smokeless tobacco: Never Used     Comment: pt  states that she will try to smoke without any aid  . Alcohol Use: No  . Drug Use: No  . Sexually Active: Not on file   Other Topics Concern  . Not on file   Social History Narrative   Lives with husband.            Family History  Problem Relation Age of Onset  . CAD Mother 65    Died of MI  . CAD Father 13    Died of MI  . CAD Brother 27    Two brothers died of MI    Allergies as of Feb 05, 2013 - Review Complete 09/25/2012  Allergen Reaction Noted  . Plavix (clopidogrel bisulfate) Palpitations 05/08/2012  . Amoxicillin Itching and Swelling 05/03/2011  . Penicillins Other (See Comments) 04/04/2007    Current Outpatient Prescriptions on File Prior to Visit  Medication Sig Dispense Refill  . aspirin 325 MG tablet Take 325 mg by mouth every morning.       . citalopram (CELEXA) 20 MG tablet Take 10 mg by mouth every morning.       Marland Kitchen  clonazePAM (KLONOPIN) 0.5 MG tablet Take 0.5 mg by mouth at bedtime.       . insulin aspart protamine-insulin aspart (NOVOLOG 70/30) (70-30) 100 UNIT/ML injection Inject 8-25 Units into the skin 2 (two) times daily with a meal. Take 25 units with breakfast and 8 units with supper      . latanoprost (XALATAN) 0.005 % ophthalmic solution Place 1 drop into both eyes every morning.       Marland Kitchen losartan-hydrochlorothiazide (HYZAAR) 100-25 MG per tablet Take 1 tablet by mouth daily.      . metFORMIN (GLUMETZA) 500 MG (MOD) 24 hr tablet Take 1,000 mg by mouth 2 (two) times daily with a meal.      . omeprazole (PRILOSEC) 20 MG capsule Take 20 mg by mouth 2 (two) times daily with breakfast and lunch.       . simvastatin (ZOCOR) 40 MG tablet Take 40 mg by mouth at bedtime.        No current facility-administered medications on file prior to visit.     REVIEW OF SYSTEMS: Please see history of present illness, otherwise all systems negative  PHYSICAL EXAMINATION:   Vital signs are There were no vitals taken for this visit. General: The patient appears their  stated age. HEENT:  No gross abnormalities Pulmonary:  Non labored breathing Musculoskeletal: There are no major deformities. Neurologic: No focal weakness or paresthesias are detected, Skin: There are no ulcer or rashes noted. Psychiatric: The patient has normal affect. Cardiovascular: There is a regular rate and rhythm without significant murmur appreciated. Pedal pulses are not palpable   Diagnostic Studies Duplex today shows a stable right ankle-brachial index at 0.79. The left has dropped off from 0.71 and November 2013 20.54 today. There appears to be total occlusion within the left superficial femoral artery.  Assessment: Bilateral claudication, left greater than right  Plan: I discussed the ultrasound findings today with the patient. Her symptoms are tolerable right now, however she wishes that she can do more. She does not have rest pain or nonhealing ulcers. I have recommended a child cilostazol to see if this can improve her walking distance. We have also discussed the importance of an exercise program. We'll give her a trial of nonoperative therapy for 3 months. She will come back to see me in 3 months. At that time we will discuss whether or not she had benefit from medications. We'll also decide whether or not she is to undergo bypass  V. Leia Alf, M.D. [facility] [place] Office: [phone] Pager:  [phone]

## 2013-02-09 DIAGNOSIS — E119 Type 2 diabetes mellitus without complications: Secondary | ICD-10-CM | POA: Diagnosis not present

## 2013-02-09 DIAGNOSIS — H409 Unspecified glaucoma: Secondary | ICD-10-CM | POA: Diagnosis not present

## 2013-02-09 DIAGNOSIS — H4011X0 Primary open-angle glaucoma, stage unspecified: Secondary | ICD-10-CM | POA: Diagnosis not present

## 2013-02-23 DIAGNOSIS — I10 Essential (primary) hypertension: Secondary | ICD-10-CM | POA: Diagnosis not present

## 2013-02-23 DIAGNOSIS — E1049 Type 1 diabetes mellitus with other diabetic neurological complication: Secondary | ICD-10-CM | POA: Diagnosis not present

## 2013-03-19 DIAGNOSIS — I10 Essential (primary) hypertension: Secondary | ICD-10-CM | POA: Diagnosis not present

## 2013-03-19 DIAGNOSIS — E119 Type 2 diabetes mellitus without complications: Secondary | ICD-10-CM | POA: Diagnosis not present

## 2013-04-20 ENCOUNTER — Encounter: Payer: Self-pay | Admitting: Surgery

## 2013-04-23 ENCOUNTER — Encounter: Payer: Self-pay | Admitting: Surgery

## 2013-04-23 ENCOUNTER — Ambulatory Visit (INDEPENDENT_AMBULATORY_CARE_PROVIDER_SITE_OTHER): Payer: Medicare Other | Admitting: Surgery

## 2013-04-23 VITALS — BP 145/83 | HR 100 | Resp 16 | Ht 66.0 in | Wt 167.9 lb

## 2013-04-23 DIAGNOSIS — Z48812 Encounter for surgical aftercare following surgery on the circulatory system: Secondary | ICD-10-CM | POA: Diagnosis not present

## 2013-04-23 DIAGNOSIS — I739 Peripheral vascular disease, unspecified: Secondary | ICD-10-CM | POA: Diagnosis not present

## 2013-04-23 NOTE — Addendum Note (Signed)
Addended by: Mena Goes on: 04/23/2013 11:05 AM   Modules accepted: Orders

## 2013-04-23 NOTE — Progress Notes (Signed)
[facility] [place]   Patient name: [name] MRN: [id number] DOB: [birth year] Sex: female     Chief Complaint  Patient presents with  . PVD    F/U  PVD  TO DISCUSS CONTINUED LEG PAIN    HISTORY OF PRESENT ILLNESS: The patient comes back today for followup of her claudication. She originally underwent stenting of her left superficial femoral artery in September of 2012. This alleviated her symptoms initially. 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 She was given a trilobar cilostazol, however she states that this has not had any significant benefit. She still has trouble walking around the grocery store. She tries to use her exercise bike but has symptoms when paddling for less than 2 minutes.  The patient is medically managed for her diabetes. Her A1c has improved from 12-10 with new medication and a different primary care physician. She continues to smoke.   Past Medical History  Diagnosis Date  . Phlebitis     30 years ago  left leg  . Dizziness   . Headache(784.0)   . Arthritis   . Muscle pain   . Anxiety   . GERD (gastroesophageal reflux disease)   . Shortness of breath   . Wheezing   . Migraines   . Diabetes mellitus   . Hyperlipidemia   . Hypertension   . Leg pain   . Stroke     multiple mini strokes ( brain aneurysm )  . Brain aneurysm 2006  . 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 Cesarean section    . Cholecystectomy    . Knee surgery    . Rotator cuff surgery    . Thyroid surgery    . Hernia repair      times two  . Abdominal hysterectomy  1984  . Femoral artery stent  05/11/11    Left superficial femoral and popliteal artery  . Dental surgery  Aug. 16, 2013    left lower   . Femoral-popliteal bypass graft  Nov. 26, 2013    Left Angiogram-     History   Social History  . Marital Status: Married   Spouse Name: N/A    Number of Children: N/A  . Years of Education: N/A   Occupational History  . Not on file.   Social History Main Topics  . Smoking status: Current Every Day Smoker -- 0.50 packs/day for 40 years    Types: Cigarettes  . Smokeless tobacco: Never Used     Comment: pt states that she will try to smoke without any aid  . Alcohol Use: No  . Drug Use: No  . Sexually Active: Not on file   Other Topics Concern  . Not on file   Social History Narrative   Lives with husband.            Family History  Problem Relation Age of Onset  . CAD Mother 11    Died of MI  . CAD Father 9    Died of MI  . CAD Brother 7    Two brothers died of MI    Allergies as of 05-02-13 - Review Complete 02-May-2013  Allergen Reaction Noted  . Plavix (clopidogrel bisulfate) Palpitations 05/08/2012  . Amoxicillin Itching and Swelling 05/03/2011  . Penicillins Other (See Comments) 04/04/2007    Current Outpatient  Prescriptions on File Prior to Visit  Medication Sig Dispense Refill  . aspirin 325 MG tablet Take 325 mg by mouth every morning.       . cilostazol (PLETAL) 100 MG tablet Take 1 tablet (100 mg total) by mouth 2 (two) times daily.  60 tablet  3  . insulin aspart protamine-insulin aspart (NOVOLOG 70/30) (70-30) 100 UNIT/ML injection Inject 8-25 Units into the skin 2 (two) times daily with a meal. Take 25 units with breakfast and 8 units with supper      . latanoprost (XALATAN) 0.005 % ophthalmic solution Place 1 drop into both eyes every morning.       Marland Kitchen losartan-hydrochlorothiazide (HYZAAR) 100-25 MG per tablet Take 1 tablet by mouth daily.      . metFORMIN (GLUMETZA) 500 MG (MOD) 24 hr tablet Take 1,000 mg by mouth 2 (two) times daily with a meal.      . omeprazole (PRILOSEC) 20 MG capsule Take 20 mg by mouth 2 (two) times daily with breakfast and lunch.       . simvastatin (ZOCOR) 40 MG tablet Take 40 mg by mouth at bedtime.       . citalopram (CELEXA) 20 MG tablet Take  10 mg by mouth every morning.       . clonazePAM (KLONOPIN) 0.5 MG tablet Take 0.5 mg by mouth at bedtime.        No current facility-administered medications on file prior to visit.     REVIEW OF SYSTEMS: Cardiovascular: Positive for pain in her legs with walking and pain in her feet when lying flat Pulmonary: No productive cough, asthma or wheezing. Neurologic: Positive for weakness in her legs as well as numbness Hematologic: No bleeding problems or clotting disorders. Musculoskeletal: No joint pain or joint swelling. Gastrointestinal: No blood in stool or hematemesis Genitourinary: No dysuria or hematuria. Psychiatric:: No history of major depression. Integumentary: No rashes or ulcers. Constitutional: No fever or chills.  PHYSICAL EXAMINATION:   Vital signs are BP 145/83  Pulse 100  Resp 16  Ht 5\' 6"  (1.676 m)  Wt 167 lb 14.4 oz (76.159 kg)  BMI 27.11 kg/m2 General: The patient appears their stated age. HEENT:  No gross abnormalities Pulmonary:  Non labored breathing Abdomen: Soft and non-tender Musculoskeletal: There are no major deformities. Neurologic: No focal weakness or paresthesias are detected, Skin: There are no ulcer or rashes noted. Psychiatric: The patient has normal affect. Cardiovascular: There is a regular rate and rhythm without significant murmur appreciated. Pedal pulses are not palpable. Palpable femoral pulses   Diagnostic Studies None  Assessment: Peripheral vascular disease with claudication, left greater than right Plan: Patient is currently working very hard to improve her diabetic management. We also discussed today about smoking cessation. Because she is working on her overall health, I have recommended continued observation of her claudication symptoms which appeared to be stable and tolerable at this time. I spent approximately 30 minutes discussing the lodging behind nonoperative therapy at this point. She will followup in 6 months with a  repeat ultrasound. She will contact me if she develops a nonhealing wound. We discussed checking her feet daily.  Eldridge Abrahams, M.D. [facility] [place] Office: [phone] Pager:  [phone]

## 2013-04-25 ENCOUNTER — Other Ambulatory Visit: Payer: Self-pay

## 2013-05-07 DIAGNOSIS — E109 Type 1 diabetes mellitus without complications: Secondary | ICD-10-CM | POA: Diagnosis not present

## 2013-05-07 DIAGNOSIS — IMO0001 Reserved for inherently not codable concepts without codable children: Secondary | ICD-10-CM | POA: Diagnosis not present

## 2013-05-07 DIAGNOSIS — I10 Essential (primary) hypertension: Secondary | ICD-10-CM | POA: Diagnosis not present

## 2013-07-02 DIAGNOSIS — D1039 Benign neoplasm of other parts of mouth: Secondary | ICD-10-CM | POA: Diagnosis not present

## 2013-07-09 DIAGNOSIS — Z79899 Other long term (current) drug therapy: Secondary | ICD-10-CM | POA: Diagnosis not present

## 2013-07-09 DIAGNOSIS — I10 Essential (primary) hypertension: Secondary | ICD-10-CM | POA: Diagnosis not present

## 2013-07-09 DIAGNOSIS — Z23 Encounter for immunization: Secondary | ICD-10-CM | POA: Diagnosis not present

## 2013-07-09 DIAGNOSIS — E781 Pure hyperglyceridemia: Secondary | ICD-10-CM | POA: Diagnosis not present

## 2013-07-09 DIAGNOSIS — E1149 Type 2 diabetes mellitus with other diabetic neurological complication: Secondary | ICD-10-CM | POA: Diagnosis not present

## 2013-07-09 DIAGNOSIS — E559 Vitamin D deficiency, unspecified: Secondary | ICD-10-CM | POA: Diagnosis not present

## 2013-07-09 DIAGNOSIS — G909 Disorder of the autonomic nervous system, unspecified: Secondary | ICD-10-CM | POA: Diagnosis not present

## 2013-07-21 HISTORY — PX: EYE SURGERY: SHX253

## 2013-07-26 ENCOUNTER — Telehealth: Payer: Self-pay | Admitting: *Deleted

## 2013-07-26 ENCOUNTER — Other Ambulatory Visit: Payer: Self-pay

## 2013-07-26 DIAGNOSIS — E109 Type 1 diabetes mellitus without complications: Secondary | ICD-10-CM

## 2013-07-26 NOTE — Telephone Encounter (Signed)
New  Rx =    Accu check meter / lancets / strips  rx with dx to [facility]  [address]

## 2013-07-27 MED ORDER — GLUCOSE BLOOD VI STRP: ORAL_STRIP | Status: DC

## 2013-07-30 ENCOUNTER — Telehealth: Payer: Self-pay | Admitting: Internal Medicine

## 2013-07-30 ENCOUNTER — Telehealth: Payer: Self-pay | Admitting: Emergency Medicine

## 2013-07-30 ENCOUNTER — Other Ambulatory Visit: Payer: Self-pay | Admitting: Internal Medicine

## 2013-07-30 DIAGNOSIS — E119 Type 2 diabetes mellitus without complications: Secondary | ICD-10-CM

## 2013-07-30 MED ORDER — ACCU-CHEK ACTIVE CARE KIT KIT: 1.0000 | PACK | Freq: Three times a day (TID) | Status: DC

## 2013-07-30 NOTE — Telephone Encounter (Signed)
PLEASE SEND RX TO [facility] AT [address] FOR ACCUCHECK METER.

## 2013-07-30 NOTE — Telephone Encounter (Signed)
PLEASE ON APPT REFERRAL FOR FOOT DR.

## 2013-08-01 ENCOUNTER — Other Ambulatory Visit: Payer: Self-pay | Admitting: Emergency Medicine

## 2013-08-01 DIAGNOSIS — E109 Type 1 diabetes mellitus without complications: Secondary | ICD-10-CM

## 2013-08-10 DIAGNOSIS — H2510 Age-related nuclear cataract, unspecified eye: Secondary | ICD-10-CM | POA: Diagnosis not present

## 2013-08-10 DIAGNOSIS — H4011X0 Primary open-angle glaucoma, stage unspecified: Secondary | ICD-10-CM | POA: Diagnosis not present

## 2013-08-20 ENCOUNTER — Encounter: Payer: Self-pay | Admitting: Internal Medicine

## 2013-08-24 DIAGNOSIS — H4011X0 Primary open-angle glaucoma, stage unspecified: Secondary | ICD-10-CM | POA: Diagnosis not present

## 2013-08-29 ENCOUNTER — Ambulatory Visit (INDEPENDENT_AMBULATORY_CARE_PROVIDER_SITE_OTHER): Payer: Medicare Other | Admitting: Podiatrist

## 2013-08-29 ENCOUNTER — Encounter: Payer: Self-pay | Admitting: Podiatrist

## 2013-08-29 VITALS — BP 126/68 | HR 82 | Resp 16 | Ht 66.0 in | Wt 154.0 lb

## 2013-08-29 DIAGNOSIS — M79609 Pain in unspecified limb: Secondary | ICD-10-CM

## 2013-08-29 DIAGNOSIS — B351 Tinea unguium: Secondary | ICD-10-CM

## 2013-08-29 NOTE — Patient Instructions (Addendum)
Diabetes and Foot Care Diabetes may cause you to have problems because of poor blood supply (circulation) to your feet and legs. This may cause the skin on your feet to become thinner, break easier, and heal more slowly. Your skin may become dry, and the skin may peel and crack. You may also have nerve damage in your legs and feet causing decreased feeling in them. You may not notice minor injuries to your feet that could lead to infections or more serious problems. Taking care of your feet is one of the most important things you can do for yourself.  HOME CARE INSTRUCTIONS  Wear shoes at all times, even in the house. Do not go barefoot. Bare feet are easily injured.  Check your feet daily for blisters, cuts, and redness. If you cannot see the bottom of your feet, use a mirror or ask someone for help.  Wash your feet with warm water (do not use hot water) and mild soap. Then pat your feet and the areas between your toes until they are completely dry. Do not soak your feet as this can dry your skin.  Apply a moisturizing lotion or petroleum jelly (that does not contain alcohol and is unscented) to the skin on your feet and to dry, brittle toenails. Do not apply lotion between your toes.  Trim your toenails straight across. Do not dig under them or around the cuticle. File the edges of your nails with an emery board or nail file.  Do not cut corns or calluses or try to remove them with medicine.  Wear clean socks or stockings every day. Make sure they are not too tight. Do not wear knee-high stockings since they may decrease blood flow to your legs.  Wear shoes that fit properly and have enough cushioning. To break in new shoes, wear them for just a few hours a day. This prevents you from injuring your feet. Always look in your shoes before you put them on to be sure there are no objects inside.  Do not cross your legs. This may decrease the blood flow to your feet.  If you find a minor scrape,  cut, or break in the skin on your feet, keep it and the skin around it clean and dry. These areas may be cleansed with mild soap and water. Do not cleanse the area with peroxide, alcohol, or iodine.  When you remove an adhesive bandage, be sure not to damage the skin around it.  If you have a wound, look at it several times a day to make sure it is healing.  Do not use heating pads or hot water bottles. They may burn your skin. If you have lost feeling in your feet or legs, you may not know it is happening until it is too late.  Make sure your health care provider performs a complete foot exam at least annually or more often if you have foot problems. Report any cuts, sores, or bruises to your health care provider immediately. SEEK MEDICAL CARE IF:   You have an injury that is not healing.  You have cuts or breaks in the skin.  You have an ingrown nail.  You notice redness on your legs or feet.  You feel burning or tingling in your legs or feet.  You have pain or cramps in your legs and feet.  Your legs or feet are numb.  Your feet always feel cold. SEEK IMMEDIATE MEDICAL CARE IF:   There is increasing redness,   swelling, or pain in or around a wound.  There is a red line that goes up your leg.  Pus is coming from a wound.  You develop a fever or as directed by your health care provider.  You notice a bad smell coming from an ulcer or wound. Document Released: 09/03/2000 Document Revised: 05/09/2013 Document Reviewed: 02/13/2013 [facility] Patient Information 2014 [place].   Smoking significantly decreases circulation-- especially to your feet and legs.  Each cigarette reduces your blood flow to your toes by 30 to 50%.  Quitting the best thing you could do for your health-- you can do it!   Smoking Hazards Smoking cigarettes is extremely bad for your health. Tobacco smoke has over 200 known poisons in it. There are over 60 chemicals in tobacco smoke that cause  cancer. Some of the chemicals found in cigarette smoke include:   Cyanide.  Benzene.  Formaldehyde.  Methanol (wood alcohol).  Acetylene (fuel used in welding torches).  Ammonia. Cigarette smoke also contains the poisonous gases nitrogen oxide and carbon monoxide.  Cigarette smokers have an increased risk of many serious medical problems, including:  Lung cancer.  Lung disease (such as pneumonia, bronchitis, and emphysema).  Heart attack and chest pain due to the heart not getting enough oxygen (angina).  Heart disease and peripheral blood vessel disease.  Hypertension.  Stroke.  Oral cancer (cancer of the lip, mouth, or voice box).  Bladder cancer.  Pancreatic cancer.  Cervical cancer.  Pregnancy complications, including premature birth.  Low birthweight babies.  Early menopause.  Lower estrogen level for women.  Infertility.  Facial wrinkles.  Blindness.  Increased risk of broken bones (fractures).  Senile dementia.  Stillbirths and smaller newborn babies, birth defects, and genetic damage to sperm.  Stomach ulcers and internal bleeding.   Smoking causes approximately:  90% of all lung cancer deaths in men.  80% of all lung cancer deaths in women.  90% of deaths from chronic obstructive lung disease. Compared with nonsmokers, smoking increases the risk of:  Coronary heart disease by 2 to 4 times.  Stroke by 2 to 4 times.  Men developing lung cancer by 23 times.  Women developing lung cancer by 13 times.  Dying from chronic obstructive lung diseases by 12 times. Someone who smokes 2 packs a day loses about 8 years of his or her life. Even smoking lightly shortens your life expectancy by several years. You can greatly reduce the risk of medical problems for you and your family by stopping now. Smoking is the most preventable cause of death and disease in our society. Within days of quitting smoking, your circulation returns to normal, you  decrease the risk of having a heart attack, and your lung capacity improves. There may be some increased phlegm in the first few days after quitting, and it may take months for your lungs to clear up completely. Quitting for 10 years cuts your lung cancer risk to almost that of a nonsmoker. WHY IS SMOKING ADDICTIVE?  Nicotine is the chemical agent in tobacco that is capable of causing addiction or dependence.  When you smoke and inhale, nicotine is absorbed rapidly into the bloodstream through your lungs. Nicotine absorbed through the lungs is capable of creating a powerful addiction. Both inhaled and non-inhaled nicotine may be addictive.  Addiction studies of cigarettes and spit tobacco show that addiction to nicotine occurs mainly during the teen years, when young people begin using tobacco products. WHAT ARE THE BENEFITS OF QUITTING?  There are many health benefits to quitting smoking.   Likelihood of developing cancer and heart disease decreases. Health improvements are seen almost immediately.  Blood pressure, pulse rate, and breathing patterns start returning to normal soon after quitting.  People who quit may see an improvement in their overall quality of life. Some people choose to quit all at once. Other options include nicotine replacement products, such as patches, gum, and nasal sprays. Do not use these products without first checking with your caregiver. QUITTING SMOKING It is not easy to quit smoking. Nicotine is addicting, and longtime habits are hard to change. To start, you can write down all your reasons for quitting, tell your family and friends you want to quit, and ask for their help. Throw your cigarettes away, chew gum or cinnamon sticks, keep your hands busy, and drink extra water or juice. Go for walks and practice deep breathing to relax. Think of all the money you are saving: around $1,000 a year, for the average pack-a-day smoker. Nicotine patches and gum have been  shown to improve success at efforts to stop smoking. Zyban (bupropion) is an anti-depressant drug that can be prescribed to reduce nicotine withdrawal symptoms and to suppress the urge to smoke. Smoking is an addiction with both physical and psychological effects. Joining a stop-smoking support group can help you cope with the emotional issues. For more information and advice on programs to stop smoking, call your doctor, your local hospital, or these organizations:  [place] - 1-800-ACS-2345 Document Released: 10/14/2004 Document Revised: 11/29/2011 Document Reviewed: 02/26/2013 [facility] Patient Information 2014 [place].

## 2013-08-29 NOTE — Progress Notes (Signed)
   Subjective:    Patient ID: [name], female    DOB: 11-Feb-1946, 67 y.o.   MRN: [id number]  HPI Comments: "My PCP wanted me to come have my feet checked"  Pt presents today at the request of Dr. [name], her PCP. She states that her blood sugars are out of control, with her last A1C at 10.5. Also, her stents have collapsed in her legs and her blood flow is down to 56%.   Patient was told not to mess with her skin and nails on feet. Was referred here for exam and foot care needs. She says that she feels like she may have some ingrown toenails.  Diabetes Hypoglycemia symptoms include dizziness, nervousness/anxiousness and tremors.      Review of Systems  Constitutional: Positive for unexpected weight change.  Cardiovascular:       Calf pain with walking  Musculoskeletal: Positive for arthralgias, gait problem and myalgias.  Skin: Positive for wound.       Change in nails  Thick scars  Neurological: Positive for dizziness, tremors and numbness.  Hematological: Bruises/bleeds easily.       Slow to heal  Psychiatric/Behavioral: The patient is nervous/anxious.   All other systems reviewed and are negative.       Objective:   Physical Exam  Vascular exam reveals nonpalpable pedal pulses DP or PT bilateral. Shiny atrophic skin is noted bilateral. Absence of digital hair growth is noted, capillary refill time is increased bilateral. Proximal distal cooling it warm to warm bilateral. Neurological examination reveals decrease in sensation via Semmes Weinstein monofilament at 3/5 sites bilateral, vibratory sensation and light touch are also decreased bilateral, Musculoskeletal examination reveals acceptable muscle strength and stability bilateral Dermatological examination reveals mildly elongated toenails. Minimal hyperkeratotic tissue around the edges of the toenails is also present especially bilateral first. Patient complains of ingrown toenails bilateral first as well.        Assessment & Plan:  Assessment is peripheral vascular disease being treated by Dr. [name], symptomatic toenails bilateral  Plan: Did very light debridement of the toenails at today's visit. Instructed her not to pick at or try and cut her own toenails as she is diabetic and with her peripheral vascular disease with her at significant risk for nonhealing and a subsequent amputation. Also recommended smoking cessation and the fact that smoking significantly decreases the blood flow to the legs. I will see her back for routine visits every 2-3 months and if any problems or concerns arise prior that visit she is instructed to call. He also discussed diabetic shoes and we'll see if she is a candidate for these shoes for her primary care physician.  Trudie Buckler DPM

## 2013-08-31 DIAGNOSIS — H4011X0 Primary open-angle glaucoma, stage unspecified: Secondary | ICD-10-CM | POA: Diagnosis not present

## 2013-09-28 DIAGNOSIS — H2510 Age-related nuclear cataract, unspecified eye: Secondary | ICD-10-CM | POA: Diagnosis not present

## 2013-09-28 DIAGNOSIS — H4011X0 Primary open-angle glaucoma, stage unspecified: Secondary | ICD-10-CM | POA: Diagnosis not present

## 2013-10-19 ENCOUNTER — Encounter: Payer: Self-pay | Admitting: Family

## 2013-10-22 ENCOUNTER — Ambulatory Visit: Payer: Medicare Other | Admitting: Family

## 2013-10-22 ENCOUNTER — Ambulatory Visit (INDEPENDENT_AMBULATORY_CARE_PROVIDER_SITE_OTHER): Payer: Medicare Other | Admitting: Family

## 2013-10-22 ENCOUNTER — Other Ambulatory Visit: Payer: Self-pay | Admitting: *Deleted

## 2013-10-22 ENCOUNTER — Encounter (HOSPITAL_COMMUNITY): Payer: Medicare Other

## 2013-10-22 ENCOUNTER — Ambulatory Visit (HOSPITAL_COMMUNITY)
Admission: RE | Admit: 2013-10-22 | Discharge: 2013-10-22 | Disposition: A | Discharge status: Home / Self Care | Payer: Medicare Other | Source: Ambulatory Visit | Attending: Family | Admitting: Family

## 2013-10-22 ENCOUNTER — Encounter: Payer: Self-pay | Admitting: Family

## 2013-10-22 VITALS — BP 105/63 | HR 82 | Resp 16 | Ht 66.0 in | Wt 145.0 lb

## 2013-10-22 DIAGNOSIS — Z48812 Encounter for surgical aftercare following surgery on the circulatory system: Secondary | ICD-10-CM

## 2013-10-22 DIAGNOSIS — I739 Peripheral vascular disease, unspecified: Secondary | ICD-10-CM | POA: Diagnosis not present

## 2013-10-22 DIAGNOSIS — I10 Essential (primary) hypertension: Secondary | ICD-10-CM | POA: Insufficient documentation

## 2013-10-22 DIAGNOSIS — F17200 Nicotine dependence, unspecified, uncomplicated: Secondary | ICD-10-CM | POA: Diagnosis not present

## 2013-10-22 DIAGNOSIS — I70219 Atherosclerosis of native arteries of extremities with intermittent claudication, unspecified extremity: Secondary | ICD-10-CM | POA: Insufficient documentation

## 2013-10-22 DIAGNOSIS — E785 Hyperlipidemia, unspecified: Secondary | ICD-10-CM | POA: Insufficient documentation

## 2013-10-22 NOTE — Progress Notes (Signed)
[facility] [place] HISTORY AND PHYSICAL -PAD   History of Present Illness [name] is a 68 y.o. female patient of Dr. Trula Slade who is s/p stenting of her left superficial femoral artery in September of 2012. This alleviated her symptoms initially. 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   She has a known cerebral aneurysm followed by her neurologist who is also following her carotid arteries, has had several TIA's. Her sister died 1-2 weeks ago, is a a little weepy. She does not like that she has lost so much weight, her sugars remain over 300. Has lost 40 pounds in the last 6 months, taking Invokana for 6 months. States her last A1C was 9.5. Her feet ache and feel cold all the time, she sees a podiatrist. Claudication pain in both thighs and calves equally after walking about 70-100 feet, relieved with rest, denies rest pain, denies non-healing wounds. Dr.  Trula Slade stopped the Pletal since it did not help her claudication symptoms.  Pt smoker: smoker  (1/2 ppd x 40 yrs)  Pt meds include: Statin :Yes ASA: Yes Other anticoagulants/antiplatelets: allergic to Plavix  Past Medical History  Diagnosis Date  . Phlebitis     30 years ago  left leg  . Dizziness   . Headache(784.0)   . Arthritis   . Muscle pain   . Anxiety   . GERD (gastroesophageal reflux disease)   . Shortness of breath   . Wheezing   . Migraines   . Diabetes mellitus   . Hyperlipidemia   . Hypertension   . Leg pain   . Stroke     multiple mini strokes ( brain aneurysm )  . Brain aneurysm 2006  . Aneurysm 2013    Right Brain     Social History History  Substance Use Topics  . Smoking status: Current Every Day Smoker -- 0.50 packs/day for 40 years    Types: Cigarettes  . Smokeless tobacco: Never Used     Comment: pt states that she will try to smoke without any aid  . Alcohol Use: No    Family  History Family History  Problem Relation Age of Onset  . CAD Mother 8    Died of MI  . Hypertension Mother   . Heart attack Mother   . CAD Father 30    Died of MI  . CAD Brother 28    Two brothers died of MI  . Heart attack Brother   . Diabetes Sister   . Hypertension Sister   . Heart attack Brother     Past Surgical History  Procedure Laterality Date  . Cesarean section    . Cholecystectomy    . Knee surgery    . Rotator cuff surgery    . Thyroid surgery    . Hernia repair      times two  . Abdominal hysterectomy  1984  . Femoral artery stent  05/11/11    Left superficial femoral and popliteal artery  . Dental surgery  Aug. 16, 2013    left lower   . Femoral-popliteal bypass graft  Nov. 26, 2013    Left Angiogram-   . Eye surgery  Nov. 2014    Laser-Glaucoma    Allergies  Allergen Reactions  . Plavix [Clopidogrel Bisulfate] Palpitations  . Amoxicillin Itching and Swelling    FACE & EYES SWELL  . Ace Inhibitors   .  Lyrica [Pregabalin]   . Penicillins Other (See Comments)    REACTION: unspecified    Current Outpatient Prescriptions  Medication Sig Dispense Refill  . AMITRIPTYLINE HCL PO Take by mouth.      Marland Kitchen aspirin 325 MG tablet Take 325 mg by mouth every morning.       . Blood Glucose Monitoring Suppl (ACCU-CHEK ACTIVE CARE KIT) KIT 1 Device by Does not apply route 3 (three) times daily.  1 each  0  . Canagliflozin (INVOKANA PO) Take by mouth.      . cilostazol (PLETAL) 100 MG tablet Take 1 tablet (100 mg total) by mouth 2 (two) times daily.  60 tablet  3  . CINNAMON PO Take 1,000 mg by mouth. 2000 MG IN AM AND 2000 AT NIGHT      . glucose blood test strip Use as instructed  100 each  5  . insulin aspart protamine-insulin aspart (NOVOLOG 70/30) (70-30) 100 UNIT/ML injection Inject 8-25 Units into the skin 2 (two) times daily with a meal. Take 25 units with breakfast and 8 units with supper      . latanoprost (XALATAN) 0.005 % ophthalmic solution Place 1  drop into both eyes every morning.       Marland Kitchen losartan-hydrochlorothiazide (HYZAAR) 100-25 MG per tablet Take 1 tablet by mouth daily.      . metFORMIN (GLUMETZA) 500 MG (MOD) 24 hr tablet Take 1,000 mg by mouth 2 (two) times daily with a meal.      . omeprazole (PRILOSEC) 20 MG capsule Take 20 mg by mouth 2 (two) times daily with breakfast and lunch.       . simvastatin (ZOCOR) 40 MG tablet Take 40 mg by mouth at bedtime.       . sitaGLIPtin (JANUVIA) 100 MG tablet Take 100 mg by mouth daily.      . vitamin E (VITAMIN E) 400 UNIT capsule Take 400 Units by mouth daily.      Marland Kitchen zinc gluconate 50 MG tablet Take 50 mg by mouth daily.      . citalopram (CELEXA) 20 MG tablet Take 10 mg by mouth every morning.       . clonazePAM (KLONOPIN) 0.5 MG tablet Take 0.5 mg by mouth at bedtime.       . IRON PO Take by mouth daily.       No current facility-administered medications for this visit.    ROS: See HPI for pertinent positives and negatives.   Physical Examination  Filed Vitals:   10/22/13 1242  BP: 105/63  Pulse: 82  Resp: 16   Filed Weights   10/22/13 1242  Weight: 145 lb (65.772 kg)   Body mass index is 23.41 kg/(m^2).  General: A&O x 3, WDWN,  Gait: normal Eyes: PERRLA, Pulmonary: CTAB, without wheezes , rales or rhonchi Cardiac: regular Rythm , without murmur          Carotid Bruits Left Right   Negative Negative  Aorta: is not palpable Radial pulses: are 2+ and palpable                           VASCULAR EXAM: Extremities without ischemic changes  without Gangrene; without open wounds.  LE Pulses LEFT RIGHT       FEMORAL   palpable   palpable        POPLITEAL  not palpable   not palpable       POSTERIOR TIBIAL  not palpable    palpable        DORSALIS PEDIS      ANTERIOR TIBIAL not palpable  not palpable    Abdomen: soft, NT, no masses. Skin: no rashes, no  ulcers noted. Musculoskeletal: no muscle wasting or atrophy.  Neurologic: A&O X 3; Appropriate Affect ; SENSATION: normal; MOTOR FUNCTION:  moving all extremities equally, motor strength 5/5 in UE's, 4/5 in LE's. Speech is fluent/normal. CN 2-12 intact.  Non-Invasive Vascular Imaging: DATE: 10/22/2013 ABI: RIGHT 0.69, Waveforms: monopasic;  LEFT 0.62, Waveforms: monophasic Previous (01/22/13) ABI's: Right: 0.79, Left: 0.54  ASSESSMENT: [name] is a 68 y.o. female who is s/p stenting of her left superficial femoral artery in September of 2012. This alleviated her symptoms initially. 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  ABI's indicate moderate bilateral arterial occlusive disease. She has moderate claudication, no rest pain, no non-healing wounds. Her atherosclerotic risk factors are her tobacco use and uncontrolled DM; she is taking daily ASA and a statin. No intervention is necessary at this time, see plan.  PLAN:  Pt was counseled re smoking cessation.  Advised graduated walking program, smoking cessation, and work closely with her PCP on DM management. I discussed in depth with the patient the nature of atherosclerosis, and emphasized the importance of maximal medical management including strict control of blood pressure, blood glucose, and lipid levels, obtaining regular exercise, and cessation of smoking.  The patient is aware that without maximal medical management the underlying atherosclerotic disease process will progress, limiting the benefit of any interventions. Based on the patient's vascular studies and examination, pt will return to clinic in 6 months for ABI's and bilateral LE arterial Duplex.  The patient was given information about PAD including signs, symptoms, treatment, what symptoms should prompt the patient to seek immediate medical care, and risk reduction measures to take.  Clemon Chambers,  RN, MSN, FNP-C [facility] Phone: [phone]  Clinic MD: Trula Slade  10/22/2013 12:47 PM

## 2013-10-22 NOTE — Patient Instructions (Addendum)
Peripheral Vascular Disease Peripheral Vascular Disease (PVD), also called Peripheral Arterial Disease (PAD), is a circulation problem caused by cholesterol (atherosclerotic plaque) deposits in the arteries. PVD commonly occurs in the lower extremities (legs) but it can occur in other areas of the body, such as your arms. The cholesterol buildup in the arteries reduces blood flow which can cause pain and other serious problems. The presence of PVD can place a person at risk for Coronary Artery Disease (CAD).  CAUSES  Causes of PVD can be many. It is usually associated with more than one risk factor such as:   High Cholesterol.  Smoking.  Diabetes.  Lack of exercise or inactivity.  High blood pressure (hypertension).  Obesity.  Family history. SYMPTOMS   When the lower extremities are affected, patients with PVD may experience:  Leg pain with exertion or physical activity. This is called INTERMITTENT CLAUDICATION. This may present as cramping or numbness with physical activity. The location of the pain is associated with the level of blockage. For example, blockage at the abdominal level (distal abdominal aorta) may result in buttock or hip pain. Lower leg arterial blockage may result in calf pain.  As PVD becomes more severe, pain can develop with less physical activity.  In people with severe PVD, leg pain may occur at rest.  Other PVD signs and symptoms:  Leg numbness or weakness.  Coldness in the affected leg or foot, especially when compared to the other leg.  A change in leg color.  Patients with significant PVD are more prone to ulcers or sores on toes, feet or legs. These may take longer to heal or may reoccur. The ulcers or sores can become infected.  If signs and symptoms of PVD are ignored, gangrene may occur. This can result in the loss of toes or loss of an entire limb.  Not all leg pain is related to PVD. Other medical conditions can cause leg pain such  as:  Blood clots (embolism) or Deep Vein Thrombosis.  Inflammation of the blood vessels (vasculitis).  Spinal stenosis. DIAGNOSIS  Diagnosis of PVD can involve several different types of tests. These can include:  Pulse Volume Recording Method (PVR). This test is simple, painless and does not involve the use of X-rays. PVR involves measuring and comparing the blood pressure in the arms and legs. An ABI (Ankle-Brachial Index) is calculated. The normal ratio of blood pressures is 1. As this number becomes smaller, it indicates more severe disease.  < 0.95  indicates significant narrowing in one or more leg vessels.  <0.8 there will usually be pain in the foot, leg or buttock with exercise.  <0.4 will usually have pain in the legs at rest.  <0.25  usually indicates limb threatening PVD.  Doppler detection of pulses in the legs. This test is painless and checks to see if you have a pulses in your legs/feet.  A dye or contrast material (a substance that highlights the blood vessels so they show up on x-ray) may be given to help your caregiver better see the arteries for the following tests. The dye is eliminated from your body by the kidney's. Your caregiver may order blood work to check your kidney function and other laboratory values before the following tests are performed:  Magnetic Resonance Angiography (MRA). An MRA is a picture study of the blood vessels and arteries. The MRA machine uses a large magnet to produce images of the blood vessels.  Computed Tomography Angiography (CTA). A CTA is a   specialized x-ray that looks at how the blood flows in your blood vessels. An IV may be inserted into your arm so contrast dye can be injected.  Angiogram. Is a procedure that uses x-rays to look at your blood vessels. This procedure is minimally invasive, meaning a small incision (cut) is made in your groin. A small tube (catheter) is then inserted into the artery of your groin. The catheter is  guided to the blood vessel or artery your caregiver wants to examine. Contrast dye is injected into the catheter. X-rays are then taken of the blood vessel or artery. After the images are obtained, the catheter is taken out. TREATMENT  Treatment of PVD involves many interventions which may include:  Lifestyle changes:  Quitting smoking.  Exercise.  Following a low fat, low cholesterol diet.  Control of diabetes.  Foot care is very important to the PVD patient. Good foot care can help prevent infection.  Medication:  Cholesterol-lowering medicine.  Blood pressure medicine.  Anti-platelet drugs.  Certain medicines may reduce symptoms of Intermittent Claudication.  Interventional/Surgical options:  Angioplasty. An Angioplasty is a procedure that inflates a balloon in the blocked artery. This opens the blocked artery to improve blood flow.  Stent Implant. A wire mesh tube (stent) is placed in the artery. The stent expands and stays in place, allowing the artery to remain open.  Peripheral Bypass Surgery. This is a surgical procedure that reroutes the blood around a blocked artery to help improve blood flow. This type of procedure may be performed if Angioplasty or stent implants are not an option. SEEK IMMEDIATE MEDICAL CARE IF:   You develop pain or numbness in your arms or legs.  Your arm or leg turns cold, becomes blue in color.  You develop redness, warmth, swelling and pain in your arms or legs. MAKE SURE YOU:   Understand these instructions.  Will watch your condition.  Will get help right away if you are not doing well or get worse. Document Released: 10/14/2004 Document Revised: 11/29/2011 Document Reviewed: 09/10/2008 Exit[employer] Patient Information 2014 [employer].   Smoking Cessation Quitting smoking is important to your health and has many advantages. However, it is not always easy to quit since nicotine is a very addictive drug. Often times, people try 3  times or more before being able to quit. This document explains the best ways for you to prepare to quit smoking. Quitting takes hard work and a lot of effort, but you can do it. ADVANTAGES OF QUITTING SMOKING  You will live longer, feel better, and live better.  Your body will feel the impact of quitting smoking almost immediately.  Within 20 minutes, blood pressure decreases. Your pulse returns to its normal level.  After 8 hours, carbon monoxide levels in the blood return to normal. Your oxygen level increases.  After 24 hours, the chance of having a heart attack starts to decrease. Your breath, hair, and body stop smelling like smoke.  After 48 hours, damaged nerve endings begin to recover. Your sense of taste and smell improve.  After 72 hours, the body is virtually free of nicotine. Your bronchial tubes relax and breathing becomes easier.  After 2 to 12 weeks, lungs can hold more air. Exercise becomes easier and circulation improves.  The risk of having a heart attack, stroke, cancer, or lung disease is greatly reduced.  After 1 year, the risk of coronary heart disease is cut in half.  After 5 years, the risk of stroke falls to   the same as a nonsmoker.  After 10 years, the risk of lung cancer is cut in half and the risk of other cancers decreases significantly.  After 15 years, the risk of coronary heart disease drops, usually to the level of a nonsmoker.  If you are pregnant, quitting smoking will improve your chances of having a healthy baby.  The people you live with, especially any children, will be healthier.  You will have extra money to spend on things other than cigarettes. QUESTIONS TO THINK ABOUT BEFORE ATTEMPTING TO QUIT You may want to talk about your answers with your caregiver.  Why do you want to quit?  If you tried to quit in the past, what helped and what did not?  What will be the most difficult situations for you after you quit? How will you plan to  handle them?  Who can help you through the tough times? Your family? Friends? A caregiver?  What pleasures do you get from smoking? What ways can you still get pleasure if you quit? Here are some questions to ask your caregiver:  How can you help me to be successful at quitting?  What medicine do you think would be best for me and how should I take it?  What should I do if I need more help?  What is smoking withdrawal like? How can I get information on withdrawal? GET READY  Set a quit date.  Change your environment by getting rid of all cigarettes, ashtrays, matches, and lighters in your home, car, or work. Do not let people smoke in your home.  Review your past attempts to quit. Think about what worked and what did not. GET SUPPORT AND ENCOURAGEMENT You have a better chance of being successful if you have help. You can get support in many ways.  Tell your family, friends, and co-workers that you are going to quit and need their support. Ask them not to smoke around you.  Get individual, group, or telephone counseling and support. Programs are available at local hospitals and health centers. Call your local health department for information about programs in your area.  Spiritual beliefs and practices may help some smokers quit.  Download a "quit meter" on your computer to keep track of quit statistics, such as how long you have gone without smoking, cigarettes not smoked, and money saved.  Get a self-help book about quitting smoking and staying off of tobacco. LEARN NEW SKILLS AND BEHAVIORS  Distract yourself from urges to smoke. Talk to someone, go for a walk, or occupy your time with a task.  Change your normal routine. Take a different route to work. Drink tea instead of coffee. Eat breakfast in a different place.  Reduce your stress. Take a hot bath, exercise, or read a book.  Plan something enjoyable to do every day. Reward yourself for not smoking.  Explore  interactive web-based programs that specialize in helping you quit. GET MEDICINE AND USE IT CORRECTLY Medicines can help you stop smoking and decrease the urge to smoke. Combining medicine with the above behavioral methods and support can greatly increase your chances of successfully quitting smoking.  Nicotine replacement therapy helps deliver nicotine to your body without the negative effects and risks of smoking. Nicotine replacement therapy includes nicotine gum, lozenges, inhalers, nasal sprays, and skin patches. Some may be available over-the-counter and others require a prescription.  Antidepressant medicine helps people abstain from smoking, but how this works is unknown. This medicine is available by prescription.    Nicotinic receptor partial agonist medicine simulates the effect of nicotine in your brain. This medicine is available by prescription. Ask your caregiver for advice about which medicines to use and how to use them based on your health history. Your caregiver will tell you what side effects to look out for if you choose to be on a medicine or therapy. Carefully read the information on the package. Do not use any other product containing nicotine while using a nicotine replacement product.  RELAPSE OR DIFFICULT SITUATIONS Most relapses occur within the first 3 months after quitting. Do not be discouraged if you start smoking again. Remember, most people try several times before finally quitting. You may have symptoms of withdrawal because your body is used to nicotine. You may crave cigarettes, be irritable, feel very hungry, cough often, get headaches, or have difficulty concentrating. The withdrawal symptoms are only temporary. They are strongest when you first quit, but they will go away within 10 14 days. To reduce the chances of relapse, try to:  Avoid drinking alcohol. Drinking lowers your chances of successfully quitting.  Reduce the amount of caffeine you consume. Once you  quit smoking, the amount of caffeine in your body increases and can give you symptoms, such as a rapid heartbeat, sweating, and anxiety.  Avoid smokers because they can make you want to smoke.  Do not let weight gain distract you. Many smokers will gain weight when they quit, usually less than 10 pounds. Eat a healthy diet and stay active. You can always lose the weight gained after you quit.  Find ways to improve your mood other than smoking. FOR MORE INFORMATION  www.smokefree.gov  Document Released: 08/31/2001 Document Revised: 03/07/2012 Document Reviewed: 12/16/2011 [employer] Patient Information 2014 [employer].  

## 2013-11-02 ENCOUNTER — Encounter: Payer: Self-pay | Admitting: Internal Medicine

## 2013-11-02 ENCOUNTER — Ambulatory Visit (INDEPENDENT_AMBULATORY_CARE_PROVIDER_SITE_OTHER): Payer: Medicare Other | Admitting: Internal Medicine

## 2013-11-02 VITALS — BP 136/74 | HR 88 | Temp 98.1°F | Resp 18 | Ht 66.0 in | Wt 146.8 lb

## 2013-11-02 DIAGNOSIS — Z0000 Encounter for general adult medical examination without abnormal findings: Secondary | ICD-10-CM

## 2013-11-02 DIAGNOSIS — E559 Vitamin D deficiency, unspecified: Secondary | ICD-10-CM | POA: Diagnosis not present

## 2013-11-02 DIAGNOSIS — E538 Deficiency of other specified B group vitamins: Secondary | ICD-10-CM | POA: Diagnosis not present

## 2013-11-02 DIAGNOSIS — Z1212 Encounter for screening for malignant neoplasm of rectum: Secondary | ICD-10-CM

## 2013-11-02 DIAGNOSIS — Z79899 Other long term (current) drug therapy: Secondary | ICD-10-CM | POA: Insufficient documentation

## 2013-11-02 DIAGNOSIS — E119 Type 2 diabetes mellitus without complications: Secondary | ICD-10-CM

## 2013-11-02 DIAGNOSIS — I10 Essential (primary) hypertension: Secondary | ICD-10-CM

## 2013-11-02 DIAGNOSIS — E785 Hyperlipidemia, unspecified: Secondary | ICD-10-CM

## 2013-11-02 LAB — HEMOGLOBIN A1C
Hgb A1c MFr Bld: 10.1 % — ABNORMAL HIGH (ref <5.7)
Mean Plasma Glucose: 243 mg/dL — ABNORMAL HIGH (ref <117)

## 2013-11-02 LAB — CBC WITH DIFFERENTIAL/PLATELET
BASOS PCT: 0 % (ref 0–1)
Basophils Absolute: 0 10*3/uL (ref 0.0–0.1)
Eosinophils Absolute: 0 10*3/uL (ref 0.0–0.7)
Eosinophils Relative: 0 % (ref 0–5)
HEMATOCRIT: 45.1 % (ref 36.0–46.0)
HEMOGLOBIN: 15.9 g/dL — AB (ref 12.0–15.0)
Lymphocytes Relative: 19 % (ref 12–46)
Lymphs Abs: 2.1 10*3/uL (ref 0.7–4.0)
MCH: 32.4 pg (ref 26.0–34.0)
MCHC: 35.3 g/dL (ref 30.0–36.0)
MCV: 91.9 fL (ref 78.0–100.0)
MONO ABS: 0.5 10*3/uL (ref 0.1–1.0)
MONOS PCT: 4 % (ref 3–12)
Neutro Abs: 8.5 10*3/uL — ABNORMAL HIGH (ref 1.7–7.7)
Neutrophils Relative %: 77 % (ref 43–77)
Platelets: 328 10*3/uL (ref 150–400)
RBC: 4.91 MIL/uL (ref 3.87–5.11)
RDW: 12.9 % (ref 11.5–15.5)
WBC: 11.2 10*3/uL — ABNORMAL HIGH (ref 4.0–10.5)

## 2013-11-02 LAB — BASIC METABOLIC PANEL WITH GFR
BUN: 21 mg/dL (ref 6–23)
CO2: 28 meq/L (ref 19–32)
Calcium: 10.2 mg/dL (ref 8.4–10.5)
Chloride: 97 mEq/L (ref 96–112)
Creat: 1.21 mg/dL — ABNORMAL HIGH (ref 0.50–1.10)
GFR, EST AFRICAN AMERICAN: 53 mL/min — AB
GFR, Est Non African American: 46 mL/min — ABNORMAL LOW
GLUCOSE: 229 mg/dL — AB (ref 70–99)
POTASSIUM: 4.1 meq/L (ref 3.5–5.3)
Sodium: 138 mEq/L (ref 135–145)

## 2013-11-02 LAB — MAGNESIUM: Magnesium: 2 mg/dL (ref 1.5–2.5)

## 2013-11-02 LAB — HEPATIC FUNCTION PANEL
ALT: 10 U/L (ref 0–35)
AST: 13 U/L (ref 0–37)
Albumin: 4.4 g/dL (ref 3.5–5.2)
Alkaline Phosphatase: 87 U/L (ref 39–117)
Bilirubin, Direct: 0.1 mg/dL (ref 0.0–0.3)
Indirect Bilirubin: 0.5 mg/dL (ref 0.2–1.2)
TOTAL PROTEIN: 7.7 g/dL (ref 6.0–8.3)
Total Bilirubin: 0.6 mg/dL (ref 0.2–1.2)

## 2013-11-02 LAB — VITAMIN B12: VITAMIN B 12: 298 pg/mL (ref 211–911)

## 2013-11-02 LAB — LIPID PANEL
Cholesterol: 168 mg/dL (ref 0–200)
HDL: 36 mg/dL — ABNORMAL LOW (ref >39)
LDL CALC: 99 mg/dL (ref 0–99)
Total CHOL/HDL Ratio: 4.7 Ratio
Triglycerides: 165 mg/dL — ABNORMAL HIGH (ref <150)
VLDL: 33 mg/dL (ref 0–40)

## 2013-11-02 LAB — TSH: TSH: 1.532 u[IU]/mL (ref 0.350–4.500)

## 2013-11-02 NOTE — Progress Notes (Signed)
Patient ID: [name], female   DOB: Apr 28, 1946, 68 y.o.   MRN: [id number]   Annual Screening Comprehensive Examination  This very nice 68 y.o. MBF presents for complete physical.  Patient has been followed for HTN, T1/T2 DM, Hyperlipidemia, and Vitamin D Deficiency.    HTN predates since 2000. Patient's BP has been controlled at home. Today's BP: 136/74 mmHg. Patient denies any cardiac symptoms as chest pain, palpitations, shortness of breath, dizziness or ankle swelling.   Patient's hyperlipidemia is controlled with diet and medications. Last Chol 18, Trig 224, HDL 32 and LDL 80 in Oct 2014 - at goal. Patient denies myalgias or other medication SE's.    Patient has T2 NIDDM predating since 1998 with last A1c 10.6% in Aug 2014 and elevated Fructosamine 416 (nl<285) in Oct 2014. Patient is on multiple Diabetic meds including MF, Invokana, Januvia and has lost 38# over the last year by stricter diet and stopping insulin. Recently she has been off of her stricter diet while frequently visiting a pre-terminal sister in [place] and her CBG's ha been running higher, so she restarted low dose Novolg 70/30 betw 12-15 units daily. Patient denies reactive hypoglycemic symptoms, visual blurring, diabetic polys, or paresthesias.    Finally, patient has history of Vitamin D Deficiency of 31 in 2012 with last vitamin D was 31 in Oct 2014.    Medication List     ACCU-CHEK ACTIVE CARE KIT Kit  1 Device by Does not apply route 3 (three) times daily.     AMITRIPTYLINE HCL PO  Take 10 mg by mouth 2 (two) times daily.     aspirin 325 MG tablet  Take 325 mg by mouth every morning.     CINNAMON PO  Take 1,000 mg by mouth. 2000 MG IN AM AND 2000 AT NIGHT     glucose blood test strip  Use as instructed     insulin aspart protamine- aspart (70-30) 100 UNIT/ML injection  Commonly known as:  NOVOLOG MIX 70/30  Inject 8-25 Units into the skin 2 (two) times daily with a meal. Take 25 units with breakfast  and 8 units with supper     INVOKANA PO  Take 150 mg by mouth daily.     IRON PO  Take by mouth daily.     losartan-hydrochlorothiazide 100-25 MG per tablet  Commonly known as:  HYZAAR  Take 1 tablet by mouth daily.     metFORMIN 500 MG 24 hr tablet  Commonly known as:  GLUCOPHAGE-XR  Take 500 mg by mouth daily with breakfast. Takes 2 tabs po 2 times a day with a meal     omeprazole 20 MG capsule  Commonly known as:  PRILOSEC  Take 20 mg by mouth 2 (two) times daily with breakfast and lunch.     simvastatin 40 MG tablet  Commonly known as:  ZOCOR  Take 40 mg by mouth at bedtime.     sitaGLIPtin 100 MG tablet  Commonly known as:  JANUVIA  Take 100 mg by mouth daily.     vitamin E 400 UNIT capsule  Generic drug:  vitamin E  Take 400 Units by mouth daily.     XALATAN 0.005 % ophthalmic solution  Generic drug:  latanoprost  Place 1 drop into both eyes every morning.     zinc gluconate 50 MG tablet  Take 50 mg by mouth daily.        Allergies  Allergen Reactions  . Plavix [Clopidogrel Bisulfate] Palpitations  .  Amoxicillin Itching and Swelling    FACE & EYES SWELL  . Ace Inhibitors   . Lyrica [Pregabalin]   . Penicillins Other (See Comments)    REACTION: unspecified    Past Medical History  Diagnosis Date  . Phlebitis     30 years ago  left leg  . Dizziness   . Headache(784.0)   . Arthritis   . Muscle pain   . Anxiety   . GERD (gastroesophageal reflux disease)   . Shortness of breath   . Wheezing   . Migraines   . Diabetes mellitus   . Hyperlipidemia   . Hypertension   . Leg pain   . Stroke     multiple mini strokes ( brain aneurysm )  . Brain aneurysm 2006  . 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 Cesarean section    . Cholecystectomy    . Knee surgery    . Rotator cuff surgery    . Thyroid surgery    . Hernia repair      times two  . Abdominal hysterectomy  1984  . Femoral artery stent   05/11/11    Left superficial femoral and popliteal artery  . Dental surgery  Aug. 16, 2013    left lower   . Femoral-popliteal bypass graft  Nov. 26, 2013    Left Angiogram-   . Eye surgery  Nov. 2014    Laser-Glaucoma    Family History  Problem Relation Age of Onset  . CAD Mother 4    Died of MI  . Hypertension Mother   . Heart attack Mother   . CAD Father 12    Died of MI  . CAD Brother 50    Two brothers died of MI  . Heart attack Brother   . Diabetes Sister   . Hypertension Sister   . Heart attack Brother     History  Substance Use Topics  . Smoking status: Current Every Day Smoker -- 0.25 packs/day for 40 years    Types: Cigarettes  . Smokeless tobacco: Never Used     Comment: pt states that she will try to smoke without any aid  . Alcohol Use: No    ROS Constitutional: Denies fever, chills, weight loss/gain, headaches, insomnia, fatigue, night sweats, and change in appetite. Eyes: Denies redness, blurred vision, diplopia, discharge, itchy, watery eyes.  ENT: Denies discharge, congestion, post nasal drip, epistaxis, sore throat, earache, hearing loss, dental pain, Tinnitus, Vertigo, Sinus pain, snoring.  Cardio: Denies chest pain, palpitations, irregular heartbeat, syncope, dyspnea, diaphoresis, orthopnea, PND, claudication, edema Respiratory: denies cough, dyspnea, DOE, pleurisy, hoarseness, laryngitis, wheezing.  Gastrointestinal: Denies dysphagia, heartburn, reflux, water brash, pain, cramps, nausea, vomiting, bloating, diarrhea, constipation, hematemesis, melena, hematochezia, jaundice, hemorrhoids Genitourinary: Denies dysuria, frequency, urgency, nocturia, hesitancy, discharge, hematuria, flank pain Breast:Breast lumps, nipple discharge, bleeding.  Musculoskeletal: Denies arthralgia, myalgia, stiffness, Jt. Swelling, pain, limp, and strain/sprain. Skin: Denies puritis, rash, hives, warts, acne, eczema, changing in skin lesion Neuro: No weakness, tremor,  incoordination, spasms, paresthesia, pain Psychiatric: Denies confusion, memory loss, sensory loss Endocrine: Denies change in weight, skin, hair change, nocturia, and paresthesia, diabetic polys, visual blurring, hyper / hypo glycemic episodes.  Heme/Lymph: No excessive bleeding, bruising, enlarged lymph nodes.  BP: 136/74  Pulse: 88  Temp: 98.1 F (36.7 C)  Resp: 18    Estimated body mass index is 23.71 kg/(m^2) as calculated from the following:  Height as of this encounter: 5' 6"  (1.676 m).   Weight as of this encounter: 146 lb 12.8 oz (66.588 kg).  Physical Exam General Appearance: Well nourished, in no apparent distress. Eyes: PERRLA, EOMs, conjunctiva no swelling or erythema, normal fundi and vessels. Sinuses: No frontal/maxillary tenderness ENT/Mouth: EACs patent / TMs  nl. Nares clear without erythema, swelling, mucoid exudates. Oral hygiene is good. No erythema, swelling, or exudate. Tongue normal, non-obstructing. Tonsils not swollen or erythematous. Hearing normal.  Neck: Supple, thyroid normal. No bruits, nodes or JVD. Respiratory: Respiratory effort normal.  BS equal and clear bilateral without rales, rhonci, wheezing or stridor. Cardio: Heart sounds are normal with regular rate and rhythm and no murmurs, rubs or gallops. Peripheral pulses are normal and equal bilaterally without edema. No aortic or femoral bruits. Chest: symmetric with normal excursions and percussion. Breasts: Symmetric, without lumps, nipple discharge, retractions, or fibrocystic changes.  Abdomen: Flat, soft, with bowl sounds. Nontender, no guarding, rebound, hernias, masses, or organomegaly.  Lymphatics: Non tender without lymphadenopathy.  Genitourinary:  Musculoskeletal: Full ROM all peripheral extremities, joint stability, 5/5 strength, and normal gait. Skin: Warm and dry without rashes, lesions, cyanosis, clubbing or  ecchymosis.  Neuro: Cranial nerves intact, reflexes equal bilaterally. Normal  muscle tone, no cerebellar symptoms. Sensation intact.  Pysch: Awake and oriented X 3, normal affect, Insight and Judgment appropriate.   Assessment and Plan  1. Annual Screening Examination 2. Hypertension  3. Hyperlipidemia 4. T1 IDDM/T2 NIDDM 5. Vitamin D Deficiency  Continue prudent diet as discussed, weight control, BP monitoring, regular exercise, and medications. Discussed med's effects and SE's. Screening labs and tests as requested with regular follow-up as recommended. Discussed stricter diet again and only use low dose insulin as necessary to keep CBG's less than 180 mg% to avoid re gaining her lost excess weight.

## 2013-11-02 NOTE — Patient Instructions (Signed)

## 2013-11-03 LAB — URINALYSIS, MICROSCOPIC ONLY
BACTERIA UA: NONE SEEN
Casts: NONE SEEN
Crystals: NONE SEEN
Squamous Epithelial / LPF: NONE SEEN

## 2013-11-03 LAB — MICROALBUMIN / CREATININE URINE RATIO
Creatinine, Urine: 44.2 mg/dL
MICROALB UR: 3.36 mg/dL — AB (ref 0.00–1.89)
MICROALB/CREAT RATIO: 76 mg/g — AB (ref 0.0–30.0)

## 2013-11-03 LAB — VITAMIN D 25 HYDROXY (VIT D DEFICIENCY, FRACTURES): VIT D 25 HYDROXY: 77 ng/mL (ref 30–89)

## 2013-11-03 LAB — INSULIN, FASTING: Insulin fasting, serum: 30 u[IU]/mL — ABNORMAL HIGH (ref 3–28)

## 2013-11-09 ENCOUNTER — Ambulatory Visit: Payer: Medicare Other | Admitting: *Deleted

## 2013-11-09 DIAGNOSIS — E119 Type 2 diabetes mellitus without complications: Secondary | ICD-10-CM

## 2013-11-09 NOTE — Progress Notes (Signed)
Pt presents for diabetic shoe measurement.

## 2013-11-23 ENCOUNTER — Encounter: Payer: Self-pay | Admitting: Podiatrist

## 2013-11-23 ENCOUNTER — Ambulatory Visit (INDEPENDENT_AMBULATORY_CARE_PROVIDER_SITE_OTHER): Payer: Medicare Other | Admitting: Podiatrist

## 2013-11-23 VITALS — BP 147/76 | HR 71 | Resp 18

## 2013-11-23 DIAGNOSIS — B351 Tinea unguium: Secondary | ICD-10-CM | POA: Diagnosis not present

## 2013-11-23 DIAGNOSIS — M79609 Pain in unspecified limb: Secondary | ICD-10-CM | POA: Diagnosis not present

## 2013-11-23 DIAGNOSIS — E119 Type 2 diabetes mellitus without complications: Secondary | ICD-10-CM

## 2013-11-23 DIAGNOSIS — I739 Peripheral vascular disease, unspecified: Secondary | ICD-10-CM

## 2013-11-23 NOTE — Progress Notes (Signed)
  Subjective:  Patient presents for foot and nail care.  She relates she has been filing her toenails with an Estate manager/land agent.  She has diagnosed pvd.  She is concerned that her diabetic shoe measurement was 2 sizes larger than her typical shoe size.   Physical Exam  Vascular exam reveals nonpalpable pedal pulses DP or PT bilateral. Shiny atrophic skin is noted bilateral. Absence of digital hair growth is noted, capillary refill time is increased bilateral. Proximal distal cooling it warm to warm bilateral.  Neurological examination reveals decrease in sensation via Semmes Weinstein monofilament at 3/5 sites bilateral, vibratory sensation and light touch are also decreased bilateral,  Musculoskeletal examination reveals acceptable muscle strength and stability bilateral  Dermatological examination reveals minimally elongated toenails. Minimal hyperkeratotic tissue around the edges of the toenails is also present especially bilateral first. Patient complains of painful corns on the sides of her 5th toe right.  Assessment & Plan:   Assessment is peripheral vascular disease being treated by Dr. [name], symptomatic toenails bilateral, listers corn  Plan: I performed a very light debridement of the toenails at today's visit. Again reiterated not to pick at or try and cut her own toenails as she is diabetic and with her peripheral vascular disease with her at significant risk for nonhealing and a subsequent amputation.She will be seen back for dispensing of diabetic shoes and we will make sure they are properly fit for her foot size.

## 2013-11-23 NOTE — Progress Notes (Deleted)
° °  Subjective:    Patient ID: [name], female    DOB: May 26, 1946, 68 y.o.   MRN: [id number]  HPI I am here to get my 3 month toenail trim done and I have not been feeling well for the last couple of days and I think it may be my inner ear    Review of Systems     Objective:   Physical Exam        Assessment & Plan:

## 2013-11-28 ENCOUNTER — Ambulatory Visit: Payer: Medicare Other | Admitting: Podiatrist

## 2013-12-03 ENCOUNTER — Encounter: Payer: Self-pay | Admitting: Internal Medicine

## 2013-12-14 DIAGNOSIS — I6509 Occlusion and stenosis of unspecified vertebral artery: Secondary | ICD-10-CM | POA: Diagnosis not present

## 2013-12-14 DIAGNOSIS — E1142 Type 2 diabetes mellitus with diabetic polyneuropathy: Secondary | ICD-10-CM | POA: Diagnosis not present

## 2013-12-14 DIAGNOSIS — E1149 Type 2 diabetes mellitus with other diabetic neurological complication: Secondary | ICD-10-CM | POA: Diagnosis not present

## 2013-12-14 DIAGNOSIS — I671 Cerebral aneurysm, nonruptured: Secondary | ICD-10-CM | POA: Diagnosis not present

## 2014-01-03 ENCOUNTER — Other Ambulatory Visit: Payer: Self-pay | Admitting: *Deleted

## 2014-01-03 DIAGNOSIS — E109 Type 1 diabetes mellitus without complications: Secondary | ICD-10-CM

## 2014-01-03 MED ORDER — INSULIN ASPART PROT & ASPART (70-30 MIX) 100 UNIT/ML ~~LOC~~ SUSP: 8.0000 [IU] | Freq: Two times a day (BID) | SUBCUTANEOUS | Status: DC

## 2014-01-03 MED ORDER — GLUCOSE BLOOD VI STRP: ORAL_STRIP | Status: DC

## 2014-01-25 DIAGNOSIS — H40019 Open angle with borderline findings, low risk, unspecified eye: Secondary | ICD-10-CM | POA: Diagnosis not present

## 2014-01-25 DIAGNOSIS — E119 Type 2 diabetes mellitus without complications: Secondary | ICD-10-CM | POA: Diagnosis not present

## 2014-01-25 DIAGNOSIS — H2510 Age-related nuclear cataract, unspecified eye: Secondary | ICD-10-CM | POA: Diagnosis not present

## 2014-02-08 ENCOUNTER — Ambulatory Visit (INDEPENDENT_AMBULATORY_CARE_PROVIDER_SITE_OTHER): Payer: Medicare Other | Admitting: Physician Assistant

## 2014-02-08 ENCOUNTER — Encounter: Payer: Self-pay | Admitting: Physician Assistant

## 2014-02-08 ENCOUNTER — Other Ambulatory Visit: Payer: Self-pay | Admitting: Internal Medicine

## 2014-02-08 VITALS — BP 138/62 | HR 80 | Temp 97.7°F | Resp 16 | Ht 66.5 in | Wt 141.0 lb

## 2014-02-08 DIAGNOSIS — Z0000 Encounter for general adult medical examination without abnormal findings: Secondary | ICD-10-CM

## 2014-02-08 DIAGNOSIS — E119 Type 2 diabetes mellitus without complications: Secondary | ICD-10-CM

## 2014-02-08 DIAGNOSIS — E785 Hyperlipidemia, unspecified: Secondary | ICD-10-CM

## 2014-02-08 DIAGNOSIS — Z79899 Other long term (current) drug therapy: Secondary | ICD-10-CM

## 2014-02-08 DIAGNOSIS — D500 Iron deficiency anemia secondary to blood loss (chronic): Secondary | ICD-10-CM

## 2014-02-08 DIAGNOSIS — Z1331 Encounter for screening for depression: Secondary | ICD-10-CM

## 2014-02-08 DIAGNOSIS — Z23 Encounter for immunization: Secondary | ICD-10-CM

## 2014-02-08 DIAGNOSIS — E2839 Other primary ovarian failure: Secondary | ICD-10-CM

## 2014-02-08 DIAGNOSIS — I739 Peripheral vascular disease, unspecified: Secondary | ICD-10-CM

## 2014-02-08 DIAGNOSIS — Z789 Other specified health status: Secondary | ICD-10-CM

## 2014-02-08 DIAGNOSIS — E559 Vitamin D deficiency, unspecified: Secondary | ICD-10-CM

## 2014-02-08 DIAGNOSIS — E109 Type 1 diabetes mellitus without complications: Secondary | ICD-10-CM

## 2014-02-08 DIAGNOSIS — I10 Essential (primary) hypertension: Secondary | ICD-10-CM | POA: Diagnosis not present

## 2014-02-08 DIAGNOSIS — H409 Unspecified glaucoma: Secondary | ICD-10-CM

## 2014-02-08 LAB — BASIC METABOLIC PANEL WITH GFR
BUN: 24 mg/dL — ABNORMAL HIGH (ref 6–23)
CO2: 27 mEq/L (ref 19–32)
Calcium: 9.8 mg/dL (ref 8.4–10.5)
Chloride: 99 mEq/L (ref 96–112)
Creat: 1.19 mg/dL — ABNORMAL HIGH (ref 0.50–1.10)
GFR, EST AFRICAN AMERICAN: 55 mL/min — AB
GFR, Est Non African American: 47 mL/min — ABNORMAL LOW
Glucose, Bld: 212 mg/dL — ABNORMAL HIGH (ref 70–99)
Potassium: 4.2 mEq/L (ref 3.5–5.3)
SODIUM: 139 meq/L (ref 135–145)

## 2014-02-08 LAB — CBC WITH DIFFERENTIAL/PLATELET
BASOS PCT: 1 % (ref 0–1)
Basophils Absolute: 0.1 10*3/uL (ref 0.0–0.1)
Eosinophils Absolute: 0.1 10*3/uL (ref 0.0–0.7)
Eosinophils Relative: 1 % (ref 0–5)
HCT: 45.1 % (ref 36.0–46.0)
Hemoglobin: 15.3 g/dL — ABNORMAL HIGH (ref 12.0–15.0)
Lymphocytes Relative: 20 % (ref 12–46)
Lymphs Abs: 2.3 10*3/uL (ref 0.7–4.0)
MCH: 30.9 pg (ref 26.0–34.0)
MCHC: 33.9 g/dL (ref 30.0–36.0)
MCV: 91.1 fL (ref 78.0–100.0)
MONOS PCT: 5 % (ref 3–12)
Monocytes Absolute: 0.6 10*3/uL (ref 0.1–1.0)
NEUTROS ABS: 8.2 10*3/uL — AB (ref 1.7–7.7)
NEUTROS PCT: 73 % (ref 43–77)
PLATELETS: 353 10*3/uL (ref 150–400)
RBC: 4.95 MIL/uL (ref 3.87–5.11)
RDW: 12.4 % (ref 11.5–15.5)
WBC: 11.3 10*3/uL — ABNORMAL HIGH (ref 4.0–10.5)

## 2014-02-08 LAB — HEPATIC FUNCTION PANEL
ALT: 10 U/L (ref 0–35)
AST: 13 U/L (ref 0–37)
Albumin: 4.4 g/dL (ref 3.5–5.2)
Alkaline Phosphatase: 65 U/L (ref 39–117)
BILIRUBIN DIRECT: 0.1 mg/dL (ref 0.0–0.3)
BILIRUBIN INDIRECT: 0.5 mg/dL (ref 0.2–1.2)
BILIRUBIN TOTAL: 0.6 mg/dL (ref 0.2–1.2)
Total Protein: 7.4 g/dL (ref 6.0–8.3)

## 2014-02-08 LAB — MAGNESIUM: Magnesium: 1.8 mg/dL (ref 1.5–2.5)

## 2014-02-08 LAB — LIPID PANEL
CHOL/HDL RATIO: 4.1 ratio
CHOLESTEROL: 156 mg/dL (ref 0–200)
HDL: 38 mg/dL — AB (ref >39)
LDL CALC: 97 mg/dL (ref 0–99)
TRIGLYCERIDES: 103 mg/dL (ref <150)
VLDL: 21 mg/dL (ref 0–40)

## 2014-02-08 LAB — TSH: TSH: 1.555 u[IU]/mL (ref 0.350–4.500)

## 2014-02-08 LAB — HEMOGLOBIN A1C
Hgb A1c MFr Bld: 9.4 % — ABNORMAL HIGH (ref <5.7)
Mean Plasma Glucose: 223 mg/dL — ABNORMAL HIGH (ref <117)

## 2014-02-08 MED ORDER — INSULIN ASPART PROT & ASPART (70-30 MIX) 100 UNIT/ML PEN: PEN_INJECTOR | SUBCUTANEOUS | Status: DC

## 2014-02-08 NOTE — Progress Notes (Addendum)
Medicare Wellness and 3 month OV  Assessment:   1. HYPERTENSION - CBC with Differential - BASIC METABOLIC PANEL WITH GFR - Hepatic function panel - TSH  2. Peripheral vascular disease, unspecified Follow up with vascular and continue 325 ASA and start walking  3. T2 NIDDM Discussed general issues about diabetes pathophysiology and management., Educational material distributed., Suggested low cholesterol diet., Encouraged aerobic exercise., Discussed foot care., Reminded to get yearly retinal exam. - Hemoglobin A1c - HM DIABETES FOOT EXAM  4. HYPERLIPIDEMIA - Lipid panel  5. DIZZINESS - cont 325, better control DM, HTN, Chol- stop smoking, follow up with cardio- if any weakness, changes speech, etc go to ER - check labs for anemia, dehydration, infection  6. GLAUCOMA Continue eye follow ups.   7. Vitamin D Deficiency  8. Encounter for long-term (current) use of other medications - Magnesium  9. Estrogen deficiency - DG Bone Density; Future  10. Pneumonia shot  11. TMJ - information given to the patient, no gum/decrease hard foods, warm wet wash clothes, decrease stress, talk with dentist about possible night guard, can do massage, and exercise.    Plan:   During the course of the visit the patient was educated and counseled about appropriate screening and preventive services including:    Pneumococcal vaccine   Influenza vaccine  Td vaccine  Screening electrocardiogram  Screening mammography  Screening Pap smear and pelvic exam   Bone densitometry screening  Colorectal cancer screening  Diabetes screening  Glaucoma screening  Nutrition counseling   Smoking cessation counseling  Screening recommendations, referrals:  Vaccinations: Tdap vaccine not indicated Influenza vaccine not indicated Pneumococcal vaccine ordered Shingles vaccine will check price and possibly get next time Hep B vaccine not indicated  Nutrition assessed and recommended   Colonoscopy uptodate Mammogram ordered Pap smear not indicated Pelvic exam not indicated Recommended yearly ophthalmology/optometry visit for glaucoma screening and checkup Recommended yearly dental visit for hygiene and checkup Advanced directives - requested  Conditions/risks identified: BMI: Discussed weight loss, diet, and increase physical activity.  Increase physical activity: AHA recommends 150 minutes of physical activity a week.  Medications reviewed DEXA- ordered Diabetes is not at goal, ACE/ARB therapy: Yes. Urinary Incontinence is not an issue: discussed non pharmacology and pharmacology options.  Fall risk: low- discussed PT, home fall assessment, medications.    Subjective:   [name] is a 68 y.o. female who presents for Medicare Annual Wellness Visit and 3 month follow up on hypertension, diabetes, hyperlipidemia, vitamin D def.  Date of last medicare wellness visit is unknown.   Her blood pressure has been controlled at home, today their BP is BP: 138/62 mmHg She does not workout. She denies chest pain, shortness of breath, dizziness.  She is on cholesterol medication and denies myalgias. Her cholesterol is not at goal. The cholesterol last visit was:   Lab Results  Component Value Date   CHOL 168 11/02/2013   HDL 36* 11/02/2013   LDLCALC 99 11/02/2013   LDLDIRECT 146.4 01/30/2010   TRIG 165* 11/02/2013   CHOLHDL 4.7 11/02/2013   She has several comorbidities from her DM including neuropathy, nephropathy, and PAD, she been working on diet and exercise for diabetes, her pen has been broken so she has not been checking her sugars, and denies polydipsia, polyuria and visual disturbances. She is on invokana, MF, Januvia, Novolog mix 70/30 she is on 8 units at night right before bed without food around 1030 at night and Last A1C in the office  was:  Lab Results  Component Value Date   HGBA1C 10.1* 11/02/2013   Patient is on Vitamin D supplement. She states she has  had some dizziness with standing for the past month, she has had several migraines with aura this month as well. Denies speech changes, vision changes other than with migraines, weakness.   Names of Other Physician/Practitioners you currently use: 1. [facility]- here for primary care 2. SrKaty Fitch, eye doctor, last visit 01/2013 Patient Care Team: [name], MD as PCP - General (Internal Medicine)  Medication Review Current Outpatient Prescriptions on File Prior to Visit  Medication Sig Dispense Refill  . AMITRIPTYLINE HCL PO Take 10 mg by mouth 2 (two) times daily.       Marland Kitchen aspirin 325 MG tablet Take 325 mg by mouth every morning.       . Blood Glucose Monitoring Suppl (ACCU-CHEK ACTIVE CARE KIT) KIT 1 Device by Does not apply route 3 (three) times daily.  1 each  0  . Canagliflozin (INVOKANA PO) Take 150 mg by mouth daily.       Marland Kitchen CINNAMON PO Take 1,000 mg by mouth. 2000 MG IN AM AND 2000 AT NIGHT      . glucose blood test strip Use as instructed  100 each  5  . insulin aspart protamine- aspart (NOVOLOG MIX 70/30) (70-30) 100 UNIT/ML injection Inject 0.08-0.25 mLs (8-25 Units total) into the skin 2 (two) times daily with a meal. Take 25 units with breakfast and 8 units with supper  10 mL  2  . IRON PO Take by mouth daily.      Marland Kitchen latanoprost (XALATAN) 0.005 % ophthalmic solution Place 1 drop into both eyes every morning.       Marland Kitchen losartan-hydrochlorothiazide (HYZAAR) 100-25 MG per tablet Take 1 tablet by mouth daily.      . metFORMIN (GLUCOPHAGE-XR) 500 MG 24 hr tablet Take 500 mg by mouth daily with breakfast. Takes 2 tabs po 2 times a day with a meal      . omeprazole (PRILOSEC) 20 MG capsule Take 20 mg by mouth 2 (two) times daily with breakfast and lunch.       . simvastatin (ZOCOR) 40 MG tablet Take 40 mg by mouth at bedtime.       . sitaGLIPtin (JANUVIA) 100 MG tablet Take 100 mg by mouth daily.      . vitamin E (VITAMIN E) 400 UNIT capsule  Take 400 Units by mouth daily.      Marland Kitchen zinc gluconate 50 MG tablet Take 50 mg by mouth daily.       No current facility-administered medications on file prior to visit.    Current Problems (verified) Patient Active Problem List   Diagnosis Date Noted  . Vitamin D Deficiency 11/02/2013  . Encounter for long-term (current) use of other medications 11/02/2013  . Peripheral vascular disease, unspecified 05/08/2012  . Atherosclerosis of native arteries of the extremities with intermittent claudication 12/24/2011  . DIVERTICULOSIS-COLON 06/09/2010  . Personal history of colonic polyps 06/09/2010  . TOBACCO USE, QUIT 03/27/2010  . ANEMIA DUE TO CHRONIC BLOOD LOSS 05/01/2009  . ESOPHAGEAL STRICTURE 08/27/2008  . Esophageal reflux 08/27/2008  . Nontoxic multinodular goiter 03/11/2008  . GLAUCOMA 03/11/2008  . CEREBRAL ANEURYSM 03/11/2008  . HYPERLIPIDEMIA 08/02/2007  . T2 NIDDM 05/01/2007  . HYPERTENSION 05/01/2007    Screening Tests Health Maintenance  Topic Date Due  . Mammogram  10/23/1996  . Colonoscopy  10/23/1996  . Zostavax  10/23/2006  . Pneumococcal Polysaccharide Vaccine Age 8 And Over  10/24/2011  . Influenza Vaccine  04/20/2014  . Tetanus/tdap  09/21/2014     Immunization History  Administered Date(s) Administered  . Influenza Whole 08/02/2007, 06/18/2009, 07/09/2013  . Td 09/21/2004    Preventative care: Last colonoscopy: 2011 Dr. Henrene Pastor due 10 years Last mammogram: 2005 OVER DUE Last pap smear/pelvic exam: 2007  DEXA: NEEDS  Prior vaccinations: TD or Tdap: 2006  Influenza: 2014  Pneumococcal: NEEDS Shingles/Zostavax: Check price  History reviewed: allergies, current medications, past family history, past medical history, past social history, past surgical history and problem list  Risk Factors: Osteoporosis: postmenopausal estrogen deficiency and dietary calcium and/or vitamin D deficiency History of fracture in the past year: no  Tobacco History   Substance Use Topics  . Smoking status: Current Every Day Smoker -- 0.25 packs/day for 40 years    Types: Cigarettes  . Smokeless tobacco: Never Used     Comment: pt states that she will try to smoke without any aid  . Alcohol Use: No   She does smoke.   Are there smokers in your home (other than you)?  No  Alcohol Current alcohol use: none  Caffeine Current caffeine use: coffee 1 /day  Exercise Current exercise: no regular exercise  Nutrition/Diet Current diet: in general, a "healthy" diet    Cardiac risk factors: advanced age (older than 40 for men, 63 for women), diabetes mellitus, dyslipidemia, family history of premature cardiovascular disease, hypertension, sedentary lifestyle and smoking/ tobacco exposure.  Depression Screen (Note: if answer to either of the following is "Yes", a more complete depression screening is indicated)   Q1: Over the past two weeks, have you felt down, depressed or hopeless? No  Q2: Over the past two weeks, have you felt little interest or pleasure in doing things? No  Have you lost interest or pleasure in daily life? No  Do you often feel hopeless? No  Do you cry easily over simple problems? No  Activities of Daily Living In your present state of health, do you have any difficulty performing the following activities?:  Driving? No Managing money?  No Feeding yourself? No Getting from bed to chair? No Climbing a flight of stairs? No Preparing food and eating?: No Bathing or showering? No Getting dressed: No Getting to the toilet? No Using the toilet:No Moving around from place to place: No In the past year have you fallen or had a near fall?:No   Are you sexually active?  No  Do you have more than one partner?  No  Vision Difficulties: No  Hearing Difficulties: No Do you often ask people to speak up or repeat themselves? No Do you experience ringing or noises in your ears? No Do you have difficulty understanding soft or  whispered voices? No  Cognition  Do you feel that you have a problem with memory?No  Do you often misplace items? No  Do you feel safe at home?  Yes  Advanced directives Does patient have a [place]? Yes Does patient have a Living Will? Yes   Objective:   Blood pressure 138/62, pulse 80, temperature 97.7 F (36.5 C), resp. rate 16, height 5' 6.5" (1.689 m), weight 141 lb (63.957 kg). Body mass index is 22.42 kg/(m^2).  General appearance: alert, no distress, WD/WN,  female Cognitive Testing  Alert? Yes  Normal Appearance?Yes  Oriented to person? Yes  Place? Yes   Time?  Yes  Recall of three objects?  Yes  Can perform simple calculations? Yes  Displays appropriate judgment?Yes  Can read the correct time from a watch face?Yes  HEENT: normocephalic, sclerae anicteric, TMs pearly, nares patent, no discharge or erythema, pharynx normal, + TMJ tenderness Oral cavity: MMM, no lesions Neck: supple, no lymphadenopathy, no thyromegaly, no masses Heart: RRR, normal S1, S2, no murmurs Lungs: CTA bilaterally, no wheezes, rhonchi, or rales Abdomen: +bs, soft, non tender, non distended, no masses, no hepatomegaly, no splenomegaly Musculoskeletal: nontender, no swelling, no obvious deformity Extremities: no edema, no cyanosis, no clubbing Pulses: 1+ symmetric, upper and lower extremities, normal cap refill Neurological: alert, oriented x 3, CN2-12 intact, strength normal upper extremities and lower extremities, sensation normal throughout, DTRs 2+ throughout, no cerebellar signs, gait normal Psychiatric: normal affect, behavior normal, pleasant  Breast: defer Gyn: defer Rectal: defer  Medicare Attestation I have personally reviewed: The patient's medical and social history Their use of alcohol, tobacco or illicit drugs Their current medications and supplements The patient's functional ability including ADLs,fall risks, home safety risks, cognitive, and hearing and  visual impairment Diet and physical activities Evidence for depression or mood disorders  The patient's weight, height, BMI, and visual acuity have been recorded in the chart.  I have made referrals, counseling, and provided education to the patient based on review of the above and I have provided the patient with a written personalized care plan for preventive services.     [name], PA-C   02/08/2014

## 2014-02-08 NOTE — Patient Instructions (Signed)
What is the TMJ? The temporomandibular (tem-PUH-ro-man-DIB-yoo-ler) joint, or the TMJ, connects the upper and lower jawbones. This joint allows the jaw to open wide and move back and forth when you chew, talk, or yawn.There are also several muscles that help this joint move. There can be muscle tightness and pain in the muscle that can cause several symptoms.  What causes TMJ pain? There are many causes of TMJ pain. Repeated chewing (for example, chewing gum) and clenching your teeth can cause pain in the joint. Some TMJ pain has no obvious cause. What can I do to ease the pain? There are many things you can do to help your pain get better. When you have pain:  Eat soft foods and stay away from chewy foods (for example, taffy) Try to use both sides of your mouth to chew Don't chew gum Don't open your mouth wide (for example, during yawning or singing) Don't bite your cheeks or fingernails Lower your amount of stress and worry Applying a warm, damp washcloth to the joint may help. Over-the-counter pain medicines such as ibuprofen (one brand: Advil) or acetaminophen (one brand: Tylenol) might also help. Do not use these medicines if you are allergic to them or if your doctor told you not to use them. How can I stop the pain from coming back? When your pain is better, you can do these exercises to make your muscles stronger and to keep the pain from coming back:  Resisted mouth opening: Place your thumb or two fingers under your chin and open your mouth slowly, pushing up lightly on your chin with your thumb. Hold for three to six seconds. Close your mouth slowly. Resisted mouth closing: Place your thumbs under your chin and your two index fingers on the ridge between your mouth and the bottom of your chin. Push down lightly on your chin as you close your mouth. Tongue up: Slowly open and close your mouth while keeping the tongue touching the roof of the mouth. Side-to-side jaw movement: Place an  object about one fourth of an inch thick (for example, two tongue depressors) between your front teeth. Slowly move your jaw from side to side. Increase the thickness of the object as the exercise becomes easier Forward jaw movement: Place an object about one fourth of an inch thick between your front teeth and move the bottom jaw forward so that the bottom teeth are in front of the top teeth. Increase the thickness of the object as the exercise becomes easier. These exercises should not be painful. If it hurts to do these exercises, stop doing them and talk to your family doctor.   Preventative Care for Adults - Female      MAINTAIN REGULAR HEALTH EXAMS:  A routine yearly physical is a good way to check in with your primary care provider about your health and preventive screening. It is also an opportunity to share updates about your health and any concerns you have, and receive a thorough all-over exam.   Most health insurance companies pay for at least some preventative services.  Check with your health plan for specific coverages.  WHAT PREVENTATIVE SERVICES DO WOMEN NEED?  Adult women should have their weight and blood pressure checked regularly.   Women age 22 and older should have their cholesterol levels checked regularly.  Women should be screened for cervical cancer with a Pap smear and pelvic exam beginning at either age 11, or 3 years after they become sexually activity.  Breast cancer screening generally begins at age 48 with a mammogram and breast exam by your primary care provider.    Beginning at age 77 and continuing to age 1, women should be screened for colorectal cancer.  Certain people may need continued testing until age 20.  Updating vaccinations is part of preventative care.  Vaccinations help protect against diseases such as the flu.  Osteoporosis is a disease in which the bones lose minerals and strength as we age. Women ages 62 and over should discuss this  with their caregivers, as should women after menopause who have other risk factors.  Lab tests are generally done as part of preventative care to screen for anemia and blood disorders, to screen for problems with the kidneys and liver, to screen for bladder problems, to check blood sugar, and to check your cholesterol level.  Preventative services generally include counseling about diet, exercise, avoiding tobacco, drugs, excessive alcohol consumption, and sexually transmitted infections.    GENERAL RECOMMENDATIONS FOR GOOD HEALTH:  Healthy diet:  Eat a variety of foods, including fruit, vegetables, animal or vegetable protein, such as meat, fish, chicken, and eggs, or beans, lentils, tofu, and grains, such as rice.  Drink plenty of water daily.  Decrease saturated fat in the diet, avoid lots of red meat, processed foods, sweets, fast foods, and fried foods.  Exercise:  Aerobic exercise helps maintain good heart health. At least 30-40 minutes of moderate-intensity exercise is recommended. For example, a brisk walk that increases your heart rate and breathing. This should be done on most days of the week.   Find a type of exercise or a variety of exercises that you enjoy so that it becomes a part of your daily life.  Examples are running, walking, swimming, water aerobics, and biking.  For motivation and support, explore group exercise such as aerobic class, spin class, Zumba, Yoga,or  martial arts, etc.    Set exercise goals for yourself, such as a certain weight goal, walk or run in a race such as a 5k walk/run.  Speak to your primary care provider about exercise goals.  Disease prevention:  If you smoke or chew tobacco, find out from your caregiver how to quit. It can literally save your life, no matter how long you have been a tobacco user. If you do not use tobacco, never begin.   Maintain a healthy diet and normal weight. Increased weight leads to problems with blood pressure and  diabetes.   The Body Mass Index or BMI is a way of measuring how much of your body is fat. Having a BMI above 27 increases the risk of heart disease, diabetes, hypertension, stroke and other problems related to obesity. Your caregiver can help determine your BMI and based on it develop an exercise and dietary program to help you achieve or maintain this important measurement at a healthful level.  High blood pressure causes heart and blood vessel problems.  Persistent high blood pressure should be treated with medicine if weight loss and exercise do not work.   Fat and cholesterol leaves deposits in your arteries that can block them. This causes heart disease and vessel disease elsewhere in your body.  If your cholesterol is found to be high, or if you have heart disease or certain other medical conditions, then you may need to have your cholesterol monitored frequently and be treated with medication.   Ask if you should have a cardiac stress test if your history suggests this. A stress  test is a test done on a treadmill that looks for heart disease. This test can find disease prior to there being a problem.  Menopause can be associated with physical symptoms and risks. Hormone replacement therapy is available to decrease these. You should talk to your caregiver about whether starting or continuing to take hormones is right for you.   Osteoporosis is a disease in which the bones lose minerals and strength as we age. This can result in serious bone fractures. Risk of osteoporosis can be identified using a bone density scan. Women ages 8 and over should discuss this with their caregivers, as should women after menopause who have other risk factors. Ask your caregiver whether you should be taking a calcium supplement and Vitamin D, to reduce the rate of osteoporosis.   Avoid drinking alcohol in excess (more than two drinks per day).  Avoid use of street drugs. Do not share needles with anyone. Ask for  professional help if you need assistance or instructions on stopping the use of alcohol, cigarettes, and/or drugs.  Brush your teeth twice a day with fluoride toothpaste, and floss once a day. Good oral hygiene prevents tooth decay and gum disease. The problems can be painful, unattractive, and can cause other health problems. Visit your dentist for a routine oral and dental check up and preventive care every 6-12 months.   Look at your skin regularly.  Use a mirror to look at your back. Notify your caregivers of changes in moles, especially if there are changes in shapes, colors, a size larger than a pencil eraser, an irregular border, or development of new moles.  Safety:  Use seatbelts 100% of the time, whether driving or as a passenger.  Use safety devices such as hearing protection if you work in environments with loud noise or significant background noise.  Use safety glasses when doing any work that could send debris in to the eyes.  Use a helmet if you ride a bike or motorcycle.  Use appropriate safety gear for contact sports.  Talk to your caregiver about gun safety.  Use sunscreen with a SPF (or skin protection factor) of 15 or greater.  Lighter skinned people are at a greater risk of skin cancer. Don't forget to also wear sunglasses in order to protect your eyes from too much damaging sunlight. Damaging sunlight can accelerate cataract formation.   Practice safe sex. Use condoms. Condoms are used for birth control and to help reduce the spread of sexually transmitted infections (or STIs).  Some of the STIs are gonorrhea (the clap), chlamydia, syphilis, trichomonas, herpes, HPV (human papilloma virus) and HIV (human immunodeficiency virus) which causes AIDS. The herpes, HIV and HPV are viral illnesses that have no cure. These can result in disability, cancer and death.   Keep carbon monoxide and smoke detectors in your home functioning at all times. Change the batteries every 6 months or use  a model that plugs into the wall.   Vaccinations:  Stay up to date with your tetanus shots and other required immunizations. You should have a booster for tetanus every 10 years. Be sure to get your flu shot every year, since 5%-20% of the [place] population comes down with the flu. The flu vaccine changes each year, so being vaccinated once is not enough. Get your shot in the fall, before the flu season peaks.   Other vaccines to consider:  Human Papilloma Virus or HPV causes cancer of the cervix, and other infections that can  be transmitted from person to person. There is a vaccine for HPV, and females should get immunized between the ages of 51 and 31. It requires a series of 3 shots.   Pneumococcal vaccine to protect against certain types of pneumonia.  This is normally recommended for adults age 19 or older.  However, adults younger than 66 years old with certain underlying conditions such as diabetes, heart or lung disease should also receive the vaccine.  Shingles vaccine to protect against Varicella Zoster if you are older than age 43, or younger than 67 years old with certain underlying illness.  Hepatitis A vaccine to protect against a form of infection of the liver by a virus acquired from food.  Hepatitis B vaccine to protect against a form of infection of the liver by a virus acquired from blood or body fluids, particularly if you work in health care.  If you plan to travel internationally, check with your local health department for specific vaccination recommendations.  Cancer Screening:  Breast cancer screening is essential to preventive care for women. All women age 13 and older should perform a breast self-exam every month. At age 13 and older, women should have their caregiver complete a breast exam each year. Women at ages 43 and older should have a mammogram (x-ray film) of the breasts. Your caregiver can discuss how often you need mammograms.    Cervical cancer screening  includes taking a Pap smear (sample of cells examined under a microscope) from the cervix (end of the uterus). It also includes testing for HPV (Human Papilloma Virus, which can cause cervical cancer). Screening and a pelvic exam should begin at age 44, or 3 years after a woman becomes sexually active. Screening should occur every year, with a Pap smear but no HPV testing, up to age 8. After age 19, you should have a Pap smear every 3 years with HPV testing, if no HPV was found previously.   Most routine colon cancer screening begins at the age of 5. On a yearly basis, doctors may provide special easy to use take-home tests to check for hidden blood in the stool. Sigmoidoscopy or colonoscopy can detect the earliest forms of colon cancer and is life saving. These tests use a small camera at the end of a tube to directly examine the colon. Speak to your caregiver about this at age 63, when routine screening begins (and is repeated every 5 years unless early forms of pre-cancerous polyps or small growths are found).     Smoking Cessation Quitting smoking is important to your health and has many advantages. However, it is not always easy to quit since nicotine is a very addictive drug. Often times, people try 3 times or more before being able to quit. This document explains the best ways for you to prepare to quit smoking. Quitting takes hard work and a lot of effort, but you can do it. ADVANTAGES OF QUITTING SMOKING  You will live longer, feel better, and live better.  Your body will feel the impact of quitting smoking almost immediately.  Within 20 minutes, blood pressure decreases. Your pulse returns to its normal level.  After 8 hours, carbon monoxide levels in the blood return to normal. Your oxygen level increases.  After 24 hours, the chance of having a heart attack starts to decrease. Your breath, hair, and body stop smelling like smoke.  After 48 hours, damaged nerve endings begin to  recover. Your sense of taste and smell improve.  After 72 hours, the body is virtually free of nicotine. Your bronchial tubes relax and breathing becomes easier.  After 2 to 12 weeks, lungs can hold more air. Exercise becomes easier and circulation improves.  The risk of having a heart attack, stroke, cancer, or lung disease is greatly reduced.  After 1 year, the risk of coronary heart disease is cut in half.  After 5 years, the risk of stroke falls to the same as a nonsmoker.  After 10 years, the risk of lung cancer is cut in half and the risk of other cancers decreases significantly.  After 15 years, the risk of coronary heart disease drops, usually to the level of a nonsmoker.  If you are pregnant, quitting smoking will improve your chances of having a healthy baby.  The people you live with, especially any children, will be healthier.  You will have extra money to spend on things other than cigarettes. QUESTIONS TO THINK ABOUT BEFORE ATTEMPTING TO QUIT You may want to talk about your answers with your caregiver.  Why do you want to quit?  If you tried to quit in the past, what helped and what did not?  What will be the most difficult situations for you after you quit? How will you plan to handle them?  Who can help you through the tough times? Your family? Friends? A caregiver?  What pleasures do you get from smoking? What ways can you still get pleasure if you quit? Here are some questions to ask your caregiver:  How can you help me to be successful at quitting?  What medicine do you think would be best for me and how should I take it?  What should I do if I need more help?  What is smoking withdrawal like? How can I get information on withdrawal? GET READY  Set a quit date.  Change your environment by getting rid of all cigarettes, ashtrays, matches, and lighters in your home, car, or work. Do not let people smoke in your home.  Review your past attempts to quit.  Think about what worked and what did not. GET SUPPORT AND ENCOURAGEMENT You have a better chance of being successful if you have help. You can get support in many ways.  Tell your family, friends, and co-workers that you are going to quit and need their support. Ask them not to smoke around you.  Get individual, group, or telephone counseling and support. Programs are available at [employer] and health centers. Call your local health department for information about programs in your area.  Spiritual beliefs and practices may help some smokers quit.  Download a "quit meter" on your computer to keep track of quit statistics, such as how long you have gone without smoking, cigarettes not smoked, and money saved.  Get a self-help book about quitting smoking and staying off of tobacco. [place] yourself from urges to smoke. Talk to someone, go for a walk, or occupy your time with a task.  Change your normal routine. Take a different route to work. Drink tea instead of coffee. Eat breakfast in a different place.  Reduce your stress. Take a hot bath, exercise, or read a book.  Plan something enjoyable to do every day. Reward yourself for not smoking.  Explore interactive web-based programs that specialize in helping you quit. GET MEDICINE AND USE IT CORRECTLY Medicines can help you stop smoking and decrease the urge to smoke. Combining medicine with the above  behavioral methods and support can greatly increase your chances of successfully quitting smoking.  Nicotine replacement therapy helps deliver nicotine to your body without the negative effects and risks of smoking. Nicotine replacement therapy includes nicotine gum, lozenges, inhalers, nasal sprays, and skin patches. Some may be available over-the-counter and others require a prescription.  Antidepressant medicine helps people abstain from smoking, but how this works is unknown. This medicine is  available by prescription.  Nicotinic receptor partial agonist medicine simulates the effect of nicotine in your brain. This medicine is available by prescription. Ask your caregiver for advice about which medicines to use and how to use them based on your health history. Your caregiver will tell you what side effects to look out for if you choose to be on a medicine or therapy. Carefully read the information on the package. Do not use any other product containing nicotine while using a nicotine replacement product.  RELAPSE OR DIFFICULT SITUATIONS Most relapses occur within the first 3 months after quitting. Do not be discouraged if you start smoking again. Remember, most people try several times before finally quitting. You may have symptoms of withdrawal because your body is used to nicotine. You may crave cigarettes, be irritable, feel very hungry, cough often, get headaches, or have difficulty concentrating. The withdrawal symptoms are only temporary. They are strongest when you first quit, but they will go away within 10 14 days. To reduce the chances of relapse, try to:  Avoid drinking alcohol. Drinking lowers your chances of successfully quitting.  Reduce the amount of caffeine you consume. Once you quit smoking, the amount of caffeine in your body increases and can give you symptoms, such as a rapid heartbeat, sweating, and anxiety.  Avoid smokers because they can make you want to smoke.  Do not let weight gain distract you. Many smokers will gain weight when they quit, usually less than 10 pounds. Eat a healthy diet and stay active. You can always lose the weight gained after you quit.  Find ways to improve your mood other than smoking. FOR MORE INFORMATION  www.smokefree.gov  Document Released: 08/31/2001 Document Revised: 03/07/2012 Document Reviewed: 12/16/2011 [facility] Patient Information 2014 [place], [place].

## 2014-02-22 ENCOUNTER — Ambulatory Visit: Payer: Medicare Other | Admitting: Podiatrist

## 2014-03-01 ENCOUNTER — Ambulatory Visit: Payer: Medicare Other | Admitting: Podiatrist

## 2014-03-01 ENCOUNTER — Encounter: Payer: Self-pay | Admitting: Podiatrist

## 2014-03-01 VITALS — BP 144/77 | HR 84 | Resp 18

## 2014-03-01 DIAGNOSIS — M79609 Pain in unspecified limb: Secondary | ICD-10-CM | POA: Diagnosis not present

## 2014-03-01 DIAGNOSIS — I739 Peripheral vascular disease, unspecified: Secondary | ICD-10-CM

## 2014-03-01 DIAGNOSIS — E119 Type 2 diabetes mellitus without complications: Secondary | ICD-10-CM | POA: Diagnosis not present

## 2014-03-01 DIAGNOSIS — B351 Tinea unguium: Secondary | ICD-10-CM | POA: Diagnosis not present

## 2014-03-01 NOTE — Progress Notes (Signed)
I AM HERE TO GET THIS NAIL LOOKED AT ON THE RIGHT 5TH TOE AND THE LEFT 5TH TOE I DID CUT MY NAILS STRAIGHT ACROSS AND I AM HERE TO GET MY SHOES  Subjective: Patient presents for foot and nail care. She relates she has been filing her toenails with an Estate manager/land agent. She has diagnosed pvd. She also presents today to pick up her diabetic shoes. She states that all of her toenails are fine with the exception of her fifth digital nails. She has a slight listers corn on the lateral side of the right fifth toe.  Physical Exam  Vascular exam reveals nonpalpable pedal pulses DP or PT bilateral. Shiny atrophic skin is noted bilateral. Absence of digital hair growth is noted, capillary refill time is increased bilateral. Proximal distal cooling it warm to warm bilateral.  Neurological examination reveals decrease in sensation via Semmes Weinstein monofilament at 3/5 sites bilateral, vibratory sensation and light touch are also decreased bilateral,  Musculoskeletal examination reveals acceptable muscle strength and stability bilateral  Dermatological examination reveals minimally elongated toenails. Minimal hyperkeratotic tissue around the edges of the fifth toenails is also present. Patient complains of painful corns on the sides of her 5th toe right.  Assessment & Plan:   Assessment is peripheral vascular disease being treated by Dr. [name], symptomatic toenails fifth bilateral, listers corn   Plan: I performed a very light debridement of the fifth toenails at today's visit.  We tried to dispensed a diabetic shoes however she states are not comfortable and does not want them. Will be seen back as needed.

## 2014-03-16 ENCOUNTER — Other Ambulatory Visit: Payer: Self-pay | Admitting: Internal Medicine

## 2014-04-22 DIAGNOSIS — Z1231 Encounter for screening mammogram for malignant neoplasm of breast: Secondary | ICD-10-CM | POA: Diagnosis not present

## 2014-04-25 ENCOUNTER — Other Ambulatory Visit: Payer: Self-pay | Admitting: Obstetrics and Gynecology

## 2014-04-25 ENCOUNTER — Encounter: Payer: Self-pay | Admitting: Family

## 2014-04-25 DIAGNOSIS — R928 Other abnormal and inconclusive findings on diagnostic imaging of breast: Secondary | ICD-10-CM

## 2014-04-26 ENCOUNTER — Encounter: Payer: Self-pay | Admitting: Family

## 2014-04-26 ENCOUNTER — Ambulatory Visit (INDEPENDENT_AMBULATORY_CARE_PROVIDER_SITE_OTHER): Payer: Medicare Other | Admitting: Family

## 2014-04-26 ENCOUNTER — Ambulatory Visit (HOSPITAL_COMMUNITY)
Admission: RE | Admit: 2014-04-26 | Discharge: 2014-04-26 | Disposition: A | Discharge status: Home / Self Care | Payer: Medicare Other | Source: Ambulatory Visit | Attending: Family | Admitting: Family

## 2014-04-26 VITALS — BP 135/79 | HR 68 | Resp 16 | Ht 66.0 in | Wt 140.0 lb

## 2014-04-26 DIAGNOSIS — Z48812 Encounter for surgical aftercare following surgery on the circulatory system: Secondary | ICD-10-CM

## 2014-04-26 DIAGNOSIS — I70219 Atherosclerosis of native arteries of extremities with intermittent claudication, unspecified extremity: Secondary | ICD-10-CM

## 2014-04-26 DIAGNOSIS — M79609 Pain in unspecified limb: Secondary | ICD-10-CM | POA: Diagnosis not present

## 2014-04-26 DIAGNOSIS — I739 Peripheral vascular disease, unspecified: Secondary | ICD-10-CM | POA: Diagnosis not present

## 2014-04-26 DIAGNOSIS — M79662 Pain in left lower leg: Secondary | ICD-10-CM

## 2014-04-26 NOTE — Patient Instructions (Signed)
Peripheral Vascular Disease Peripheral Vascular Disease (PVD), also called Peripheral Arterial Disease (PAD), is a circulation problem caused by cholesterol (atherosclerotic plaque) deposits in the arteries. PVD commonly occurs in the lower extremities (legs) but it can occur in other areas of the body, such as your arms. The cholesterol buildup in the arteries reduces blood flow which can cause pain and other serious problems. The presence of PVD can place a person at risk for Coronary Artery Disease (CAD).  CAUSES  Causes of PVD can be many. It is usually associated with more than one risk factor such as:   High Cholesterol.  Smoking.  Diabetes.  Lack of exercise or inactivity.  High blood pressure (hypertension).  Obesity.  Family history. SYMPTOMS   When the lower extremities are affected, patients with PVD may experience:  Leg pain with exertion or physical activity. This is called INTERMITTENT CLAUDICATION. This may present as cramping or numbness with physical activity. The location of the pain is associated with the level of blockage. For example, blockage at the abdominal level (distal abdominal aorta) may result in buttock or hip pain. Lower leg arterial blockage may result in calf pain.  As PVD becomes more severe, pain can develop with less physical activity.  In people with severe PVD, leg pain may occur at rest.  Other PVD signs and symptoms:  Leg numbness or weakness.  Coldness in the affected leg or foot, especially when compared to the other leg.  A change in leg color.  Patients with significant PVD are more prone to ulcers or sores on toes, feet or legs. These may take longer to heal or may reoccur. The ulcers or sores can become infected.  If signs and symptoms of PVD are ignored, gangrene may occur. This can result in the loss of toes or loss of an entire limb.  Not all leg pain is related to PVD. Other medical conditions can cause leg pain such  as:  Blood clots (embolism) or Deep Vein Thrombosis.  Inflammation of the blood vessels (vasculitis).  Spinal stenosis. DIAGNOSIS  Diagnosis of PVD can involve several different types of tests. These can include:  Pulse Volume Recording Method (PVR). This test is simple, painless and does not involve the use of X-rays. PVR involves measuring and comparing the blood pressure in the arms and legs. An ABI (Ankle-Brachial Index) is calculated. The normal ratio of blood pressures is 1. As this number becomes smaller, it indicates more severe disease.  < 0.95 - indicates significant narrowing in one or more leg vessels.  <0.8 - there will usually be pain in the foot, leg or buttock with exercise.  <0.4 - will usually have pain in the legs at rest.  <0.25 - usually indicates limb threatening PVD.  Doppler detection of pulses in the legs. This test is painless and checks to see if you have a pulses in your legs/feet.  A dye or contrast material (a substance that highlights the blood vessels so they show up on x-ray) may be given to help your caregiver better see the arteries for the following tests. The dye is eliminated from your body by the kidney's. Your caregiver may order blood work to check your kidney function and other laboratory values before the following tests are performed:  Magnetic Resonance Angiography (MRA). An MRA is a picture study of the blood vessels and arteries. The MRA machine uses a large magnet to produce images of the blood vessels.  Computed Tomography Angiography (CTA). A CTA   is a specialized x-ray that looks at how the blood flows in your blood vessels. An IV may be inserted into your arm so contrast dye can be injected.  Angiogram. Is a procedure that uses x-rays to look at your blood vessels. This procedure is minimally invasive, meaning a small incision (cut) is made in your groin. A small tube (catheter) is then inserted into the artery of your groin. The catheter  is guided to the blood vessel or artery your caregiver wants to examine. Contrast dye is injected into the catheter. X-rays are then taken of the blood vessel or artery. After the images are obtained, the catheter is taken out. TREATMENT  Treatment of PVD involves many interventions which may include:  Lifestyle changes:  Quitting smoking.  Exercise.  Following a low fat, low cholesterol diet.  Control of diabetes.  Foot care is very important to the PVD patient. Good foot care can help prevent infection.  Medication:  Cholesterol-lowering medicine.  Blood pressure medicine.  Anti-platelet drugs.  Certain medicines may reduce symptoms of Intermittent Claudication.  Interventional/Surgical options:  Angioplasty. An Angioplasty is a procedure that inflates a balloon in the blocked artery. This opens the blocked artery to improve blood flow.  Stent Implant. A wire mesh tube (stent) is placed in the artery. The stent expands and stays in place, allowing the artery to remain open.  Peripheral Bypass Surgery. This is a surgical procedure that reroutes the blood around a blocked artery to help improve blood flow. This type of procedure may be performed if Angioplasty or stent implants are not an option. SEEK IMMEDIATE MEDICAL CARE IF:   You develop pain or numbness in your arms or legs.  Your arm or leg turns cold, becomes blue in color.  You develop redness, warmth, swelling and pain in your arms or legs. MAKE SURE YOU:   Understand these instructions.  Will watch your condition.  Will get help right away if you are not doing well or get worse. Document Released: 10/14/2004 Document Revised: 11/29/2011 Document Reviewed: 09/10/2008 ExitCare Patient Information 2015 [employer]. This information is not intended to replace advice given to you by your health care provider. Make sure you discuss any questions you have with your health care provider.   Smoking  Cessation Quitting smoking is important to your health and has many advantages. However, it is not always easy to quit since nicotine is a very addictive drug. Oftentimes, people try 3 times or more before being able to quit. This document explains the best ways for you to prepare to quit smoking. Quitting takes hard work and a lot of effort, but you can do it. ADVANTAGES OF QUITTING SMOKING  You will live longer, feel better, and live better.  Your body will feel the impact of quitting smoking almost immediately.  Within 20 minutes, blood pressure decreases. Your pulse returns to its normal level.  After 8 hours, carbon monoxide levels in the blood return to normal. Your oxygen level increases.  After 24 hours, the chance of having a heart attack starts to decrease. Your breath, hair, and body stop smelling like smoke.  After 48 hours, damaged nerve endings begin to recover. Your sense of taste and smell improve.  After 72 hours, the body is virtually free of nicotine. Your bronchial tubes relax and breathing becomes easier.  After 2 to 12 weeks, lungs can hold more air. Exercise becomes easier and circulation improves.  The risk of having a heart attack, stroke, cancer,   or lung disease is greatly reduced.  After 1 year, the risk of coronary heart disease is cut in half.  After 5 years, the risk of stroke falls to the same as a nonsmoker.  After 10 years, the risk of lung cancer is cut in half and the risk of other cancers decreases significantly.  After 15 years, the risk of coronary heart disease drops, usually to the level of a nonsmoker.  If you are pregnant, quitting smoking will improve your chances of having a healthy baby.  The people you live with, especially any children, will be healthier.  You will have extra money to spend on things other than cigarettes. QUESTIONS TO THINK ABOUT BEFORE ATTEMPTING TO QUIT You may want to talk about your answers with your health care  provider.  Why do you want to quit?  If you tried to quit in the past, what helped and what did not?  What will be the most difficult situations for you after you quit? How will you plan to handle them?  Who can help you through the tough times? Your family? Friends? A health care provider?  What pleasures do you get from smoking? What ways can you still get pleasure if you quit? Here are some questions to ask your health care provider:  How can you help me to be successful at quitting?  What medicine do you think would be best for me and how should I take it?  What should I do if I need more help?  What is smoking withdrawal like? How can I get information on withdrawal? GET READY  Set a quit date.  Change your environment by getting rid of all cigarettes, ashtrays, matches, and lighters in your home, car, or work. Do not let people smoke in your home.  Review your past attempts to quit. Think about what worked and what did not. GET SUPPORT AND ENCOURAGEMENT You have a better chance of being successful if you have help. You can get support in many ways.  Tell your family, friends, and coworkers that you are going to quit and need their support. Ask them not to smoke around you.  Get individual, group, or telephone counseling and support. Programs are available at local hospitals and health centers. Call your local health department for information about programs in your area.  Spiritual beliefs and practices may help some smokers quit.  Download a "quit meter" on your computer to keep track of quit statistics, such as how long you have gone without smoking, cigarettes not smoked, and money saved.  Get a self-help book about quitting smoking and staying off tobacco. LEARN NEW SKILLS AND BEHAVIORS  Distract yourself from urges to smoke. Talk to someone, go for a walk, or occupy your time with a task.  Change your normal routine. Take a different route to work. Drink tea  instead of coffee. Eat breakfast in a different place.  Reduce your stress. Take a hot bath, exercise, or read a book.  Plan something enjoyable to do every day. Reward yourself for not smoking.  Explore interactive web-based programs that specialize in helping you quit. GET MEDICINE AND USE IT CORRECTLY Medicines can help you stop smoking and decrease the urge to smoke. Combining medicine with the above behavioral methods and support can greatly increase your chances of successfully quitting smoking.  Nicotine replacement therapy helps deliver nicotine to your body without the negative effects and risks of smoking. Nicotine replacement therapy includes nicotine gum, lozenges, inhalers,   nasal sprays, and skin patches. Some may be available over-the-counter and others require a prescription.  Antidepressant medicine helps people abstain from smoking, but how this works is unknown. This medicine is available by prescription.  Nicotinic receptor partial agonist medicine simulates the effect of nicotine in your brain. This medicine is available by prescription. Ask your health care provider for advice about which medicines to use and how to use them based on your health history. Your health care provider will tell you what side effects to look out for if you choose to be on a medicine or therapy. Carefully read the information on the package. Do not use any other product containing nicotine while using a nicotine replacement product.  RELAPSE OR DIFFICULT SITUATIONS Most relapses occur within the first 3 months after quitting. Do not be discouraged if you start smoking again. Remember, most people try several times before finally quitting. You may have symptoms of withdrawal because your body is used to nicotine. You may crave cigarettes, be irritable, feel very hungry, cough often, get headaches, or have difficulty concentrating. The withdrawal symptoms are only temporary. They are strongest when you  first quit, but they will go away within 10-14 days. To reduce the chances of relapse, try to:  Avoid drinking alcohol. Drinking lowers your chances of successfully quitting.  Reduce the amount of caffeine you consume. Once you quit smoking, the amount of caffeine in your body increases and can give you symptoms, such as a rapid heartbeat, sweating, and anxiety.  Avoid smokers because they can make you want to smoke.  Do not let weight gain distract you. Many smokers will gain weight when they quit, usually less than 10 pounds. Eat a healthy diet and stay active. You can always lose the weight gained after you quit.  Find ways to improve your mood other than smoking. FOR MORE INFORMATION  www.smokefree.gov  Document Released: 08/31/2001 Document Revised: 01/21/2014 Document Reviewed: 12/16/2011 ExitCare Patient Information 2015 [employer]. This information is not intended to replace advice given to you by your health care provider. Make sure you discuss any questions you have with your health care provider.  

## 2014-04-26 NOTE — Progress Notes (Signed)
[facility] [place] HISTORY AND PHYSICAL -PAD  History of Present Illness [name] is a 68 y.o. female patient of Dr. Trula Slade who is s/p stenting of her left superficial femoral artery in September of 2012. This alleviated her symptoms initially. 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  She has a known cerebral aneurysm followed by her neurologist who is also following her carotid arteries, has had several TIA's.   Her last A1C was 9.4 May, 2015, she thinks her A1C will be improved when checked this month.   Her feet ache and feel cold all the time, she sees a podiatrist.  She stays dizzy and falls a lot.  Claudication pain in both thighs and calves equally after walking about 70 feet, worse than 6 months ago, relieved with rest, denies rest pain, denies non-healing wounds.  She denies post prandial abdominal pain, but states she does not eat much. Dr. Trula Slade stopped the Pletal since it did not help her claudication symptoms.  She reports arthritis pain in both knees. She does not walk much due to the pain in her knees and legs with walking.   Pt smoker: smoker (1/2 ppd x 40 yrs)  Pt meds include:  Statin :Yes  ASA: Yes  Other anticoagulants/antiplatelets: allergic to Plavix    Past Medical History  Diagnosis Date  . Phlebitis     30 years ago  left leg  . Anxiety   . GERD (gastroesophageal reflux disease)   . Migraines   . Diabetes mellitus 1998  . Hyperlipidemia   . Hypertension 2000  . Stroke     multiple mini strokes ( brain aneurysm )  . 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 Status post colonoscopy 07/2010  . Abnormal findings on esophagogastroduodenoscopy (EGD) 07/2010    Social History History  Substance Use Topics  . Smoking status: Current Every Day Smoker -- 0.25 packs/day for 40 years    Types: Cigarettes  . Smokeless tobacco: Never Used     Comment: pt states  that she will try to smoke without any aid  . Alcohol Use: No    Family History Family History  Problem Relation Age of Onset  . CAD Mother 72    Died of MI  . Hypertension Mother   . Heart attack Mother   . CAD Father 74    Died of MI  . CAD Brother 15    Two brothers died of MI  . Heart attack Brother   . Diabetes Sister   . Hypertension Sister   . Heart attack Brother     Past Surgical History  Procedure Laterality Date  . Cesarean section    . Cholecystectomy    . Knee surgery    . Rotator cuff surgery    . Thyroid surgery    . Hernia repair      times two  . Abdominal hysterectomy  1984  . Femoral artery stent  05/11/11    Left superficial femoral and popliteal artery  . Dental surgery  Aug. 16, 2013    left lower   . Femoral-popliteal bypass graft  Nov. 26, 2013    Left Angiogram-   . Eye surgery  Nov. 2014    Laser-Glaucoma  . Esophagogastroduodenoscopy  07/2010  . Colonoscopy  07/2010    Allergies  Allergen Reactions  . Plavix [Clopidogrel Bisulfate] Palpitations  . Amoxicillin Itching  and Swelling    FACE & EYES SWELL  . Ace Inhibitors   . Lyrica [Pregabalin]   . Penicillins Other (See Comments)    REACTION: unspecified    Current Outpatient Prescriptions  Medication Sig Dispense Refill  . AMITRIPTYLINE HCL PO Take 10 mg by mouth 2 (two) times daily.       Marland Kitchen aspirin 325 MG tablet Take 325 mg by mouth every morning.       . Blood Glucose Monitoring Suppl (ACCU-CHEK ACTIVE CARE KIT) KIT 1 Device by Does not apply route 3 (three) times daily.  1 each  0  . Canagliflozin (INVOKANA PO) Take 150 mg by mouth daily.       Marland Kitchen CINNAMON PO Take 1,000 mg by mouth. 2000 MG IN AM AND 2000 AT NIGHT      . citalopram (CELEXA) 20 MG tablet       . doxycycline (VIBRAMYCIN) 100 MG capsule       . glucose blood test strip 1 each by Other route 3 (three) times daily. Dispense 3 month supply Accu Check testing strips 3 boxes of 100      . Insulin Aspart Prot &  Aspart (NOVOLOG 70/30 MIX) (70-30) 100 UNIT/ML Pen 8-15 units subcutaneous daily with food.  15 mL  1  . IRON PO Take by mouth daily.      Marland Kitchen latanoprost (XALATAN) 0.005 % ophthalmic solution Place 1 drop into both eyes every morning.       Marland Kitchen losartan-hydrochlorothiazide (HYZAAR) 100-25 MG per tablet TAKE 1 TABLET BY MOUTH EVERY DAY FOR BLOOD PRESSURE  90 tablet  3  . metFORMIN (GLUCOPHAGE-XR) 500 MG 24 hr tablet TAKE 2 TABLETS BY MOUTH TWICE A DAY  360 tablet  4  . omeprazole (PRILOSEC) 20 MG capsule Take 20 mg by mouth 2 (two) times daily with breakfast and lunch.       . simvastatin (ZOCOR) 40 MG tablet Take 40 mg by mouth at bedtime.       . sitaGLIPtin (JANUVIA) 100 MG tablet Take 100 mg by mouth daily.      . vitamin E (VITAMIN E) 400 UNIT capsule Take 400 Units by mouth daily.      Marland Kitchen zinc gluconate 50 MG tablet Take 50 mg by mouth daily.       No current facility-administered medications for this visit.    ROS: See HPI for pertinent positives and negatives.   Physical Examination  Filed Vitals:   04/26/14 1000  BP: 135/79  Pulse: 68  Resp: 16  Height: 5' 6"  (1.676 m)  Weight: 140 lb (63.504 kg)  SpO2: 98%   Body mass index is 22.61 kg/(m^2).  General: A&O x 3, WDWN,  Gait: normal  Eyes: PERRLA,  Pulmonary: CTAB, without wheezes , rales or rhonchi  Cardiac: regular Rythm , without murmur  Carotid Bruits  Left  Right    Negative  Negative   Aorta: is not palpable  Radial pulses: are 2+ and palpable  VASCULAR EXAM:  Extremities without ischemic changes  without Gangrene; without open wounds.  LE Pulses  LEFT  RIGHT   FEMORAL  palpable  palpable   POPLITEAL  not palpable  not palpable   POSTERIOR TIBIAL  not palpable  palpable   DORSALIS PEDIS  ANTERIOR TIBIAL  not palpable  not palpable   Abdomen: soft, NT, no masses.  Skin: no rashes, no ulcers noted.  Musculoskeletal: no muscle wasting or atrophy.  Neurologic: A&O X 3;  Appropriate Affect ; SENSATION: normal;  MOTOR FUNCTION: moving all extremities equally, motor strength 4/5 in UE's, 4/5 in LE's. Speech is fluent/normal. CN 2-12 intact.   Non-Invasive Vascular Imaging: DATE: 04/26/2014 ABI: RIGHT 0.61, Waveforms: monophasic;  LEFT 0.59, Waveforms: monophasic Previous (10/22/13) ABI's: Right: 0.69, Left: 0.62   ASSESSMENT: [name] is a 68 y.o. female who is s/p stenting of her left superficial femoral artery in September of 2012. This alleviated her symptoms initially. 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  She has a known cerebral aneurysm followed by her neurologist who is also following her carotid arteries, has had several TIA's.  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 She has moderate to severe worsening claudication symptoms with walking. Unfortunately she continues to smoke and was counseled against smoking, given information about a free smoking cessation class. Her DM is also uncontrolled, but she is working closely with her medical provider to bring her DM under better control. Since she is a fall risk she cannot participate in a graduated walking program. Seated leg and arms exercises, see Plan.   PLAN:   Seated leg and arm exercises daily as discussed, or stationary bike 30 minutes daily, or graduated walking program if she is not prone to falling. She was again counseled re smoking cessation.  I discussed in depth with the patient the nature of atherosclerosis, and emphasized the importance of maximal medical management including strict control of blood pressure, blood glucose, and lipid levels, obtaining regular exercise, and cessation of smoking.  The patient is aware that without maximal medical management the underlying atherosclerotic disease process will progress, limiting the benefit of any interventions.  Based on the patient's vascular  studies and examination, pt will return to clinic in 6 months for ABI's.  The patient was given information about PAD including signs, symptoms, treatment, what symptoms should prompt the patient to seek immediate medical care, and risk reduction measures to take.  Clemon Chambers, RN, MSN, FNP-C [facility] Phone: [phone]  Clinic MD: Kellie Simmering on call  04/26/2014 9:37 AM

## 2014-04-30 ENCOUNTER — Ambulatory Visit
Admission: RE | Admit: 2014-04-30 | Discharge: 2014-04-30 | Disposition: A | Discharge status: Home / Self Care | Payer: Medicare Other | Source: Ambulatory Visit | Attending: Obstetrics and Gynecology | Admitting: Obstetrics and Gynecology

## 2014-04-30 DIAGNOSIS — R928 Other abnormal and inconclusive findings on diagnostic imaging of breast: Secondary | ICD-10-CM

## 2014-04-30 DIAGNOSIS — R922 Inconclusive mammogram: Secondary | ICD-10-CM | POA: Diagnosis not present

## 2014-05-03 ENCOUNTER — Other Ambulatory Visit: Payer: Self-pay | Admitting: *Deleted

## 2014-05-03 MED ORDER — CANAGLIFLOZIN 300 MG PO TABS: 300.0000 mg | ORAL_TABLET | Freq: Every day | ORAL | Status: DC

## 2014-05-12 NOTE — Progress Notes (Signed)
Patient ID: [name], female   DOB: 1946/01/20, 68 y.o.   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    Patient is treated for HTN & BP has been controlled at home. Today's BP: 138/60 mmHg. Patient denies any cardiac type chest pain, palpitations, dyspnea/orthopnea/PND, dizziness, claudication, or dependent edema.   Hyperlipidemia is controlled with diet & meds. Patient denies myalgias or other med SE's. Last Lipids were  Chol 156; HDL  38*; LDL  97; Trig 103 on 02/08/2014 - all at goal.   Also, the patient has history of T1_IDDM and patient denies any symptoms of reactive hypoglycemia, diabetic polys, paresthesias or visual blurring.  CBG's range up to 160 mg% and she covers with bid (AM/PM) SS of 0-8-16 units. Last A1c was 9.4% on 02/08/2014 - not at goal     Further, Patient has history of Vitamin D Deficiency and patient supplements vitamin D without any suspected side-effects. Last vitamin D was  77 on 11/02/2013.   Medication List   ACCU-CHEK ACTIVE CARE KIT Kit  1 Device by Does not apply route 3 (three) times daily.     AMITRIPTYLINE HCL PO  Take 10 mg by mouth 2 (two) times daily.     aspirin 325 MG tablet  Take 325 mg by mouth every morning.     Canagliflozin 300 MG Tabs  Commonly known as:  INVOKANA  Take 1/2 tyo 1 tablet daily as directed for Diabetes     CINNAMON PO  Take 1,000 mg by mouth. 2000 MG IN AM AND 2000 AT NIGHT     citalopram 20 MG tablet  Commonly known as:  CELEXA     glucose blood test strip  - 1 each by Other route 3 (three) times daily. Dispense 3 month supply  - Accu Check testing strips  - 3 boxes of 100     Insulin Aspart Prot & Aspart (70-30) 100 UNIT/ML Pen  Commonly known as:  NOVOLOG 70/30 MIX  Take up to 15 units  2 x daily by sliding scale as directed for Diabetes     IRON PO  Take by mouth daily.      losartan-hydrochlorothiazide 100-25 MG per tablet  Commonly known as:  HYZAAR  TAKE 1 TABLET BY MOUTH EVERY DAY FOR BLOOD PRESSURE     metFORMIN 500 MG 24 hr tablet  Commonly known as:  GLUCOPHAGE-XR  TAKE 2 TABLETS BY MOUTH TWICE A DAY     NOVOFINE 32G X 6 MM Misc  Generic drug:  Insulin Pen Needle  USE AS DIRECTED 4 TIMES A DAY     omeprazole 20 MG capsule  Commonly known as:  PRILOSEC  Take 20 mg by mouth 2 (two) times daily with breakfast and lunch.     simvastatin 40 MG tablet  Commonly known as:  ZOCOR  Take 40 mg by mouth at bedtime.     sitaGLIPtin 100 MG tablet  Commonly known as:  JANUVIA  Take 1/2 to 1 tablet daily as directed for Diabetes     vitamin E 400 UNIT capsule  Generic drug:  vitamin E  Take 400 Units by mouth daily.     XALATAN 0.005 % ophthalmic solution  Generic drug:  latanoprost  Place 1 drop into both eyes every morning.     zinc gluconate 50 MG tablet  Take 50 mg by mouth daily.  Allergies  Allergen Reactions  . Plavix [Clopidogrel Bisulfate] Palpitations  . Amoxicillin Itching and Swelling    FACE & EYES SWELL  . Ace Inhibitors   . Lyrica [Pregabalin]   . Penicillins Other (See Comments)    REACTION: unspecified   PMHx:   Past Medical History  Diagnosis Date  . Phlebitis     30 years ago  left leg  . Anxiety   . GERD (gastroesophageal reflux disease)   . Migraines   . Diabetes mellitus 1998  . Hyperlipidemia   . Hypertension 2000  . Stroke     multiple mini strokes ( brain aneurysm )  . 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 Status post colonoscopy 07/2010  . Abnormal findings on esophagogastroduodenoscopy (EGD) 07/2010  . Peripheral vascular disease    FHx:    Reviewed / unchanged SHx:    Reviewed / unchanged  Systems Review:  Constitutional: Denies fever, chills, wt changes, headaches, insomnia, fatigue, night sweats, change in appetite. Eyes: Denies redness, blurred vision, diplopia, discharge, itchy, watery eyes.   ENT: Denies discharge, congestion, post nasal drip, epistaxis, sore throat, earache, hearing loss, dental pain, tinnitus, vertigo, sinus pain, snoring.  CV: Denies chest pain, palpitations, irregular heartbeat, syncope, dyspnea, diaphoresis, orthopnea, PND, claudication or edema. Respiratory: denies cough, dyspnea, DOE, pleurisy, hoarseness, laryngitis, wheezing.  Gastrointestinal: Denies dysphagia, odynophagia, heartburn, reflux, water brash, abdominal pain or cramps, nausea, vomiting, bloating, diarrhea, constipation, hematemesis, melena, hematochezia  or hemorrhoids. Genitourinary: Denies dysuria, frequency, urgency, nocturia, hesitancy, discharge, hematuria or flank pain. Musculoskeletal: Denies arthralgias, myalgias, stiffness, jt. swelling, pain, limping or strain/sprain.  Skin: Denies pruritus, rash, hives, warts, acne, eczema or change in skin lesion(s). Neuro: No weakness, tremor, incoordination, spasms, paresthesia or pain. Psychiatric: Denies confusion, memory loss or sensory loss. Endo: Denies change in weight, skin or hair change.  Heme/Lymph: No excessive bleeding, bruising or enlarged lymph nodes.  Exam:  BP 138/60  Pulse 76  Temp(Src) 97.7 F (36.5 C) (Temporal)  Resp 16  Ht 5' 6"  (1.676 m)  Wt 141 lb 1.6 oz (64.003 kg)  BMI 22.79 kg/m2  Appears well nourished and in no distress. Eyes: PERRLA, EOMs, conjunctiva no swelling or erythema. Sinuses: No frontal/maxillary tenderness ENT/Mouth: EAC's clear, TM's nl w/o erythema, bulging. Nares clear w/o erythema, swelling, exudates. Oropharynx clear without erythema or exudates. Oral hygiene is good. Tongue normal, non obstructing. Hearing intact.  Neck: Supple. Thyroid nl. Car 2+/2+ without bruits, nodes or JVD. Chest: Respirations nl with BS clear & equal w/o rales, rhonchi, wheezing or stridor.  Cor: Heart sounds normal w/ regular rate and rhythm without sig. murmurs, gallops, clicks, or rubs. Peripheral pulses normal and  equal  without edema.  Abdomen: Soft & bowel sounds normal. Non-tender w/o guarding, rebound, hernias, masses, or organomegaly.  Lymphatics: Unremarkable.  Musculoskeletal: Full ROM all peripheral extremities, joint stability, 5/5 strength, and normal gait.  Skin: Warm, dry without exposed rashes, lesions or ecchymosis apparent.  Neuro: Cranial nerves intact, reflexes equal bilaterally. Sensory-motor testing grossly intact. Tendon reflexes grossly intact.  Pysch: Alert & oriented x 3.  Insight and judgement nl & appropriate. No ideations.   Assessment and Plan:  1. Hypertension - Continue monitor blood pressure at home. Continue diet/meds same.  2. Hyperlipidemia - Continue diet/meds, exercise,& lifestyle modifications. Continue monitor periodic cholesterol/liver & renal functions   3. T1_IDDM w/CKD3 - Continue diet, exercise, lifestyle modifications. Monitor appropriate labs.  4. Vitamin D Deficiency - Continue supplementation.  Recommended regular  exercise, BP monitoring, weight control, and discussed med and SE's. Recommended labs to assess and monitor clinical status. Further disposition pending results of labs.

## 2014-05-12 NOTE — Patient Instructions (Signed)

## 2014-05-13 ENCOUNTER — Other Ambulatory Visit: Payer: Self-pay | Admitting: Internal Medicine

## 2014-05-13 ENCOUNTER — Ambulatory Visit (INDEPENDENT_AMBULATORY_CARE_PROVIDER_SITE_OTHER): Payer: Medicare Other | Admitting: Internal Medicine

## 2014-05-13 ENCOUNTER — Encounter: Payer: Self-pay | Admitting: Internal Medicine

## 2014-05-13 VITALS — BP 138/60 | HR 76 | Temp 97.7°F | Resp 16 | Ht 66.0 in | Wt 141.1 lb

## 2014-05-13 DIAGNOSIS — I10 Essential (primary) hypertension: Secondary | ICD-10-CM

## 2014-05-13 DIAGNOSIS — I70219 Atherosclerosis of native arteries of extremities with intermittent claudication, unspecified extremity: Secondary | ICD-10-CM

## 2014-05-13 DIAGNOSIS — E559 Vitamin D deficiency, unspecified: Secondary | ICD-10-CM | POA: Diagnosis not present

## 2014-05-13 DIAGNOSIS — E785 Hyperlipidemia, unspecified: Secondary | ICD-10-CM

## 2014-05-13 DIAGNOSIS — E119 Type 2 diabetes mellitus without complications: Secondary | ICD-10-CM | POA: Diagnosis not present

## 2014-05-13 DIAGNOSIS — Z79899 Other long term (current) drug therapy: Secondary | ICD-10-CM

## 2014-05-13 LAB — CBC WITH DIFFERENTIAL/PLATELET
BASOS ABS: 0.1 10*3/uL (ref 0.0–0.1)
Basophils Relative: 1 % (ref 0–1)
Eosinophils Absolute: 0 10*3/uL (ref 0.0–0.7)
Eosinophils Relative: 0 % (ref 0–5)
HEMATOCRIT: 44.2 % (ref 36.0–46.0)
HEMOGLOBIN: 14.8 g/dL (ref 12.0–15.0)
LYMPHS PCT: 20 % (ref 12–46)
Lymphs Abs: 2.6 10*3/uL (ref 0.7–4.0)
MCH: 30 pg (ref 26.0–34.0)
MCHC: 33.5 g/dL (ref 30.0–36.0)
MCV: 89.5 fL (ref 78.0–100.0)
MONO ABS: 0.8 10*3/uL (ref 0.1–1.0)
Monocytes Relative: 6 % (ref 3–12)
NEUTROS PCT: 73 % (ref 43–77)
Neutro Abs: 9.3 10*3/uL — ABNORMAL HIGH (ref 1.7–7.7)
Platelets: 408 10*3/uL — ABNORMAL HIGH (ref 150–400)
RBC: 4.94 MIL/uL (ref 3.87–5.11)
RDW: 13.1 % (ref 11.5–15.5)
WBC: 12.8 10*3/uL — AB (ref 4.0–10.5)

## 2014-05-13 LAB — LIPID PANEL
CHOLESTEROL: 153 mg/dL (ref 0–200)
HDL: 41 mg/dL (ref >39)
LDL Cholesterol: 88 mg/dL (ref 0–99)
Total CHOL/HDL Ratio: 3.7 Ratio
Triglycerides: 121 mg/dL (ref <150)
VLDL: 24 mg/dL (ref 0–40)

## 2014-05-13 LAB — BASIC METABOLIC PANEL WITH GFR
BUN: 25 mg/dL — AB (ref 6–23)
CHLORIDE: 99 meq/L (ref 96–112)
CO2: 29 mEq/L (ref 19–32)
Calcium: 10.3 mg/dL (ref 8.4–10.5)
Creat: 1.28 mg/dL — ABNORMAL HIGH (ref 0.50–1.10)
GFR, Est African American: 50 mL/min — ABNORMAL LOW
GFR, Est Non African American: 43 mL/min — ABNORMAL LOW
Glucose, Bld: 214 mg/dL — ABNORMAL HIGH (ref 70–99)
POTASSIUM: 5.5 meq/L — AB (ref 3.5–5.3)
Sodium: 137 mEq/L (ref 135–145)

## 2014-05-13 LAB — HEPATIC FUNCTION PANEL
ALK PHOS: 78 U/L (ref 39–117)
ALT: 10 U/L (ref 0–35)
AST: 15 U/L (ref 0–37)
Albumin: 4.5 g/dL (ref 3.5–5.2)
BILIRUBIN INDIRECT: 0.5 mg/dL (ref 0.2–1.2)
BILIRUBIN TOTAL: 0.6 mg/dL (ref 0.2–1.2)
Bilirubin, Direct: 0.1 mg/dL (ref 0.0–0.3)
Total Protein: 7.6 g/dL (ref 6.0–8.3)

## 2014-05-13 LAB — MAGNESIUM: Magnesium: 2.1 mg/dL (ref 1.5–2.5)

## 2014-05-13 LAB — HEMOGLOBIN A1C
HEMOGLOBIN A1C: 9 % — AB (ref <5.7)
MEAN PLASMA GLUCOSE: 212 mg/dL — AB (ref <117)

## 2014-05-13 LAB — TSH: TSH: 1.065 u[IU]/mL (ref 0.350–4.500)

## 2014-05-13 MED ORDER — SITAGLIPTIN PHOSPHATE 100 MG PO TABS: ORAL_TABLET | ORAL | Status: DC

## 2014-05-13 MED ORDER — INSULIN ASPART PROT & ASPART (70-30 MIX) 100 UNIT/ML PEN: PEN_INJECTOR | SUBCUTANEOUS | Status: DC

## 2014-05-13 MED ORDER — CANAGLIFLOZIN 300 MG PO TABS: ORAL_TABLET | ORAL | Status: DC

## 2014-05-14 LAB — VITAMIN D 25 HYDROXY (VIT D DEFICIENCY, FRACTURES): VIT D 25 HYDROXY: 78 ng/mL (ref 30–89)

## 2014-05-31 ENCOUNTER — Ambulatory Visit (INDEPENDENT_AMBULATORY_CARE_PROVIDER_SITE_OTHER): Payer: Medicare Other | Admitting: Podiatrist

## 2014-05-31 DIAGNOSIS — M79609 Pain in unspecified limb: Secondary | ICD-10-CM

## 2014-05-31 DIAGNOSIS — B351 Tinea unguium: Secondary | ICD-10-CM

## 2014-05-31 DIAGNOSIS — M79676 Pain in unspecified toe(s): Secondary | ICD-10-CM

## 2014-05-31 DIAGNOSIS — I739 Peripheral vascular disease, unspecified: Secondary | ICD-10-CM

## 2014-05-31 NOTE — Patient Instructions (Signed)
Diabetes and Foot Care Diabetes may cause you to have problems because of poor blood supply (circulation) to your feet and legs. This may cause the skin on your feet to become thinner, break easier, and heal more slowly. Your skin may become dry, and the skin may peel and crack. You may also have nerve damage in your legs and feet causing decreased feeling in them. You may not notice minor injuries to your feet that could lead to infections or more serious problems. Taking care of your feet is one of the most important things you can do for yourself.  HOME CARE INSTRUCTIONS  Wear shoes at all times, even in the house. Do not go barefoot. Bare feet are easily injured.  Check your feet daily for blisters, cuts, and redness. If you cannot see the bottom of your feet, use a mirror or ask someone for help.  Wash your feet with warm water (do not use hot water) and mild soap. Then pat your feet and the areas between your toes until they are completely dry. Do not soak your feet as this can dry your skin.  Apply a moisturizing lotion or petroleum jelly (that does not contain alcohol and is unscented) to the skin on your feet and to dry, brittle toenails. Do not apply lotion between your toes.  Trim your toenails straight across. Do not dig under them or around the cuticle. File the edges of your nails with an emery board or nail file.  Do not cut corns or calluses or try to remove them with medicine.  Wear clean socks or stockings every day. Make sure they are not too tight. Do not wear knee-high stockings since they may decrease blood flow to your legs.  Wear shoes that fit properly and have enough cushioning. To break in new shoes, wear them for just a few hours a day. This prevents you from injuring your feet. Always look in your shoes before you put them on to be sure there are no objects inside.  Do not cross your legs. This may decrease the blood flow to your feet.  If you find a minor scrape,  cut, or break in the skin on your feet, keep it and the skin around it clean and dry. These areas may be cleansed with mild soap and water. Do not cleanse the area with peroxide, alcohol, or iodine.  When you remove an adhesive bandage, be sure not to damage the skin around it.  If you have a wound, look at it several times a day to make sure it is healing.  Do not use heating pads or hot water bottles. They may burn your skin. If you have lost feeling in your feet or legs, you may not know it is happening until it is too late.  Make sure your health care provider performs a complete foot exam at least annually or more often if you have foot problems. Report any cuts, sores, or bruises to your health care provider immediately. SEEK MEDICAL CARE IF:   You have an injury that is not healing.  You have cuts or breaks in the skin.  You have an ingrown nail.  You notice redness on your legs or feet.  You feel burning or tingling in your legs or feet.  You have pain or cramps in your legs and feet.  Your legs or feet are numb.  Your feet always feel cold. SEEK IMMEDIATE MEDICAL CARE IF:   There is increasing redness,   swelling, or pain in or around a wound.  There is a red line that goes up your leg.  Pus is coming from a wound.  You develop a fever or as directed by your health care provider.  You notice a bad smell coming from an ulcer or wound. Document Released: 09/03/2000 Document Revised: 05/09/2013 Document Reviewed: 02/13/2013 [employer] Patient Information 2015 [employer]. This information is not intended to replace advice given to you by your health care provider. Make sure you discuss any questions you have with your health care provider.  

## 2014-05-31 NOTE — Progress Notes (Signed)
Subjective: Patient presents for foot and nail care. She relates she has been filing her toenails with an Estate manager/land agent. She has diagnosed pvd.  She states that all of her toenails are fine with the exception of her first and fifth digital nails. She has a slight listers corn on the lateral side of the right fifth toe.   Physical Exam  Vascular exam reveals nonpalpable pedal pulses DP or PT bilateral. Shiny atrophic skin is noted bilateral. Absence of digital hair growth is noted, capillary refill time is increased bilateral. Proximal distal cooling it warm to warm bilateral.  Neurological examination reveals decrease in sensation via Semmes Weinstein monofilament at 3/5 sites bilateral, vibratory sensation and light touch are also decreased bilateral,  Musculoskeletal examination reveals acceptable muscle strength and stability bilateral  Dermatological examination reveals minimally elongated toenails first and fifth bilateral. Minimal hyperkeratotic tissue around the edges of the fifth toenails is also present. Patient complains of painful corns on the top of the left fourth toe   Non-Invasive Vascular Imaging: DATE: 04/26/2014  ABI: RIGHT 0.61, Waveforms: monophasic; LEFT 0.59, Waveforms: monophasic  Previous (10/22/13) ABI's: Right: 0.69, Left: 0.62  Assessment & Plan:   Assessment is peripheral vascular disease being treated by Dr. [name], symptomatic toenails first and fifth bilateral, soft corn dorsal left fourth toe   Plan: I performed a careful debridement of the first and fifth toenails at today's visit. Recommended nonmedicated corn pad to alleviate pressure on the left fourth toe. Also recommended that she go to the shoe market for shoes. We had previously tried some diabetic shoes here however she stated she did not like them and did not want to wear them. Maybe the shoe market could help  her with a proper fitting shoe.

## 2014-07-12 ENCOUNTER — Other Ambulatory Visit: Payer: Self-pay | Admitting: Internal Medicine

## 2014-07-18 ENCOUNTER — Other Ambulatory Visit: Payer: Self-pay | Admitting: Internal Medicine

## 2014-08-12 ENCOUNTER — Ambulatory Visit: Payer: Self-pay | Admitting: Physician Assistant

## 2014-08-27 ENCOUNTER — Encounter: Payer: Self-pay | Admitting: Internal Medicine

## 2014-08-29 ENCOUNTER — Encounter (HOSPITAL_COMMUNITY): Payer: Self-pay | Admitting: Surgery

## 2014-08-30 ENCOUNTER — Ambulatory Visit (INDEPENDENT_AMBULATORY_CARE_PROVIDER_SITE_OTHER): Payer: Medicare Other | Admitting: Podiatrist

## 2014-08-30 ENCOUNTER — Encounter: Payer: Self-pay | Admitting: Podiatrist

## 2014-08-30 DIAGNOSIS — B351 Tinea unguium: Secondary | ICD-10-CM

## 2014-08-30 DIAGNOSIS — I739 Peripheral vascular disease, unspecified: Secondary | ICD-10-CM | POA: Diagnosis not present

## 2014-08-30 DIAGNOSIS — I70219 Atherosclerosis of native arteries of extremities with intermittent claudication, unspecified extremity: Secondary | ICD-10-CM

## 2014-08-30 DIAGNOSIS — M79676 Pain in unspecified toe(s): Secondary | ICD-10-CM | POA: Diagnosis not present

## 2014-08-30 NOTE — Patient Instructions (Signed)
Diabetes and Foot Care Diabetes may cause you to have problems because of poor blood supply (circulation) to your feet and legs. This may cause the skin on your feet to become thinner, break easier, and heal more slowly. Your skin may become dry, and the skin may peel and crack. You may also have nerve damage in your legs and feet causing decreased feeling in them. You may not notice minor injuries to your feet that could lead to infections or more serious problems. Taking care of your feet is one of the most important things you can do for yourself.  HOME CARE INSTRUCTIONS  Wear shoes at all times, even in the house. Do not go barefoot. Bare feet are easily injured.  Check your feet daily for blisters, cuts, and redness. If you cannot see the bottom of your feet, use a mirror or ask someone for help.  Wash your feet with warm water (do not use hot water) and mild soap. Then pat your feet and the areas between your toes until they are completely dry. Do not soak your feet as this can dry your skin.  Apply a moisturizing lotion or petroleum jelly (that does not contain alcohol and is unscented) to the skin on your feet and to dry, brittle toenails. Do not apply lotion between your toes.  Trim your toenails straight across. Do not dig under them or around the cuticle. File the edges of your nails with an emery board or nail file.  Do not cut corns or calluses or try to remove them with medicine.  Wear clean socks or stockings every day. Make sure they are not too tight. Do not wear knee-high stockings since they may decrease blood flow to your legs.  Wear shoes that fit properly and have enough cushioning. To break in new shoes, wear them for just a few hours a day. This prevents you from injuring your feet. Always look in your shoes before you put them on to be sure there are no objects inside.  Do not cross your legs. This may decrease the blood flow to your feet.  If you find a minor scrape,  cut, or break in the skin on your feet, keep it and the skin around it clean and dry. These areas may be cleansed with mild soap and water. Do not cleanse the area with peroxide, alcohol, or iodine.  When you remove an adhesive bandage, be sure not to damage the skin around it.  If you have a wound, look at it several times a day to make sure it is healing.  Do not use heating pads or hot water bottles. They may burn your skin. If you have lost feeling in your feet or legs, you may not know it is happening until it is too late.  Make sure your health care provider performs a complete foot exam at least annually or more often if you have foot problems. Report any cuts, sores, or bruises to your health care provider immediately. SEEK MEDICAL CARE IF:   You have an injury that is not healing.  You have cuts or breaks in the skin.  You have an ingrown nail.  You notice redness on your legs or feet.  You feel burning or tingling in your legs or feet.  You have pain or cramps in your legs and feet.  Your legs or feet are numb.  Your feet always feel cold. SEEK IMMEDIATE MEDICAL CARE IF:   There is increasing redness,   swelling, or pain in or around a wound.  There is a red line that goes up your leg.  Pus is coming from a wound.  You develop a fever or as directed by your health care provider.  You notice a bad smell coming from an ulcer or wound. Document Released: 09/03/2000 Document Revised: 05/09/2013 Document Reviewed: 02/13/2013 [employer] Patient Information 2015 [employer]. This information is not intended to replace advice given to you by your health care provider. Make sure you discuss any questions you have with your health care provider.  

## 2014-09-02 ENCOUNTER — Encounter: Payer: Self-pay | Admitting: Physician Assistant

## 2014-09-02 ENCOUNTER — Ambulatory Visit (INDEPENDENT_AMBULATORY_CARE_PROVIDER_SITE_OTHER): Payer: Medicare Other | Admitting: Physician Assistant

## 2014-09-02 VITALS — BP 128/64 | HR 80 | Temp 98.1°F | Resp 16 | Ht 66.0 in | Wt 145.0 lb

## 2014-09-02 DIAGNOSIS — I10 Essential (primary) hypertension: Secondary | ICD-10-CM | POA: Diagnosis not present

## 2014-09-02 DIAGNOSIS — E1169 Type 2 diabetes mellitus with other specified complication: Secondary | ICD-10-CM

## 2014-09-02 DIAGNOSIS — N183 Chronic kidney disease, stage 3 (moderate): Secondary | ICD-10-CM | POA: Diagnosis not present

## 2014-09-02 DIAGNOSIS — I70219 Atherosclerosis of native arteries of extremities with intermittent claudication, unspecified extremity: Secondary | ICD-10-CM | POA: Diagnosis not present

## 2014-09-02 DIAGNOSIS — E1129 Type 2 diabetes mellitus with other diabetic kidney complication: Secondary | ICD-10-CM | POA: Diagnosis not present

## 2014-09-02 DIAGNOSIS — E785 Hyperlipidemia, unspecified: Secondary | ICD-10-CM | POA: Diagnosis not present

## 2014-09-02 DIAGNOSIS — E1122 Type 2 diabetes mellitus with diabetic chronic kidney disease: Secondary | ICD-10-CM | POA: Diagnosis not present

## 2014-09-02 DIAGNOSIS — E559 Vitamin D deficiency, unspecified: Secondary | ICD-10-CM

## 2014-09-02 DIAGNOSIS — Z79899 Other long term (current) drug therapy: Secondary | ICD-10-CM

## 2014-09-02 LAB — CBC WITH DIFFERENTIAL/PLATELET
BASOS PCT: 0 % (ref 0–1)
Basophils Absolute: 0 10*3/uL (ref 0.0–0.1)
EOS ABS: 0.1 10*3/uL (ref 0.0–0.7)
Eosinophils Relative: 1 % (ref 0–5)
HEMATOCRIT: 42.4 % (ref 36.0–46.0)
Hemoglobin: 14.6 g/dL (ref 12.0–15.0)
Lymphocytes Relative: 23 % (ref 12–46)
Lymphs Abs: 2.7 10*3/uL (ref 0.7–4.0)
MCH: 31.3 pg (ref 26.0–34.0)
MCHC: 34.4 g/dL (ref 30.0–36.0)
MCV: 91 fL (ref 78.0–100.0)
MPV: 10.2 fL (ref 9.4–12.4)
Monocytes Absolute: 0.6 10*3/uL (ref 0.1–1.0)
Monocytes Relative: 5 % (ref 3–12)
NEUTROS PCT: 71 % (ref 43–77)
Neutro Abs: 8.4 10*3/uL — ABNORMAL HIGH (ref 1.7–7.7)
PLATELETS: 335 10*3/uL (ref 150–400)
RBC: 4.66 MIL/uL (ref 3.87–5.11)
RDW: 13.7 % (ref 11.5–15.5)
WBC: 11.9 10*3/uL — ABNORMAL HIGH (ref 4.0–10.5)

## 2014-09-02 LAB — BASIC METABOLIC PANEL WITH GFR
BUN: 20 mg/dL (ref 6–23)
CALCIUM: 9.7 mg/dL (ref 8.4–10.5)
CO2: 27 mEq/L (ref 19–32)
Chloride: 101 mEq/L (ref 96–112)
Creat: 1.12 mg/dL — ABNORMAL HIGH (ref 0.50–1.10)
GFR, EST AFRICAN AMERICAN: 59 mL/min — AB
GFR, Est Non African American: 51 mL/min — ABNORMAL LOW
Glucose, Bld: 216 mg/dL — ABNORMAL HIGH (ref 70–99)
Potassium: 4.5 mEq/L (ref 3.5–5.3)
SODIUM: 137 meq/L (ref 135–145)

## 2014-09-02 LAB — HEPATIC FUNCTION PANEL
ALT: 16 U/L (ref 0–35)
AST: 19 U/L (ref 0–37)
Albumin: 4 g/dL (ref 3.5–5.2)
Alkaline Phosphatase: 82 U/L (ref 39–117)
BILIRUBIN DIRECT: 0.1 mg/dL (ref 0.0–0.3)
Indirect Bilirubin: 0.5 mg/dL (ref 0.2–1.2)
TOTAL PROTEIN: 7.3 g/dL (ref 6.0–8.3)
Total Bilirubin: 0.6 mg/dL (ref 0.2–1.2)

## 2014-09-02 LAB — TSH: TSH: 1.444 u[IU]/mL (ref 0.350–4.500)

## 2014-09-02 LAB — LIPID PANEL
CHOLESTEROL: 139 mg/dL (ref 0–200)
HDL: 41 mg/dL (ref >39)
LDL Cholesterol: 76 mg/dL (ref 0–99)
TRIGLYCERIDES: 112 mg/dL (ref <150)
Total CHOL/HDL Ratio: 3.4 Ratio
VLDL: 22 mg/dL (ref 0–40)

## 2014-09-02 LAB — MAGNESIUM: MAGNESIUM: 1.9 mg/dL (ref 1.5–2.5)

## 2014-09-02 LAB — HEMOGLOBIN A1C
Hgb A1c MFr Bld: 9.3 % — ABNORMAL HIGH (ref <5.7)
Mean Plasma Glucose: 220 mg/dL — ABNORMAL HIGH (ref <117)

## 2014-09-02 NOTE — Progress Notes (Signed)
Assessment and Plan:  Hypertension: Continue medication, monitor blood pressure at home. Continue DASH diet.  Reminder to go to the ER if any CP, SOB, nausea, dizziness, severe HA, changes vision/speech, left arm numbness and tingling and jaw pain. Cholesterol: Continue diet and exercise. Check cholesterol.  Diabetes with diabetic chronic kidney disease, with diabetic peripheral angiopathy without gangrene and with diabetic polyneuropathy-Continue diet and exercise. Check A1C Vitamin D Def- check level and continue medications.  CKD stage III due to DM-Increase fluids, avoid NSAIDS, will monitor AB pain- ? Adhesions, does not want any test at this time, increase fiber, check labs, and will monitor.   Continue diet and meds as discussed. Further disposition pending results of labs. Discussed med's effects and SE's.   HPI [name]  presents for 3 month follow up with hypertension, hyperlipidemia, diabetes and vitamin D. Her blood pressure has been controlled at home, today their BP is BP: 128/64 mmHg She does not workout. She denies chest pain, shortness of breath, dizziness.  She is on cholesterol medication, simvastatin 12m and denies myalgias. Her cholesterol is not at goal of 70 or less. The cholesterol was:  05/13/2014: Cholesterol, Total 153; HDL Cholesterol by NMR 41; LDL (calc) 88; Triglycerides 121 She has been working on diet and exercise for Diabetes with diabetic chronic kidney disease, with diabetic peripheral angiopathy without gangrene and with diabetic polyneuropathy. She has CKD stage III due to DM, last GFR was 50. Also she is on ASA 325, she is on ACE/ARB, she is on insulin novolog 70/30 mix with 10 units AM and 12 units PM, invokana 3090m Januvia 10024m has been out of for 6 weeks due to cost), and metformin and denies paresthesia of the feet, polydipsia, polyuria and visual disturbances. Last A1C was: 05/13/2014: Hemoglobin-A1c 9.0* Patient is on Vitamin D  supplement. 05/13/2014: Vit D, 25-Hydroxy 78 She also reports some AB tenderness for the past 5-6 years after a surgery she had that had ventral hernia, cramping/tender pain, no accompaniments, worse lying on her side, better sitting in recliner chair.  Current Medications:  Current Outpatient Prescriptions on File Prior to Visit  Medication Sig Dispense Refill  . AMITRIPTYLINE HCL PO Take 10 mg by mouth 2 (two) times daily.     . aMarland Kitchenpirin 325 MG tablet Take 325 mg by mouth every morning.     . Blood Glucose Monitoring Suppl (ACCU-CHEK ACTIVE CARE KIT) KIT 1 Device by Does not apply route 3 (three) times daily. 1 each 0  . Canagliflozin (INVOKANA) 300 MG TABS Take 1/2 tyo 1 tablet daily as directed for Diabetes 90 tablet 99  . CINNAMON PO Take 1,000 mg by mouth. 2000 MG IN AM AND 2000 AT NIGHT    . citalopram (CELEXA) 20 MG tablet     . glucose blood test strip 1 each by Other route 3 (three) times daily. Dispense 3 month supply Accu Check testing strips 3 boxes of 100    . Insulin Aspart Prot & Aspart (NOVOLOG 70/30 MIX) (70-30) 100 UNIT/ML Pen Take up to 15 units  2 x daily by sliding scale as directed for Diabetes 30 mL 90  . IRON PO Take by mouth daily.    . lMarland Kitchentanoprost (XALATAN) 0.005 % ophthalmic solution Place 1 drop into both eyes every morning.     . lMarland Kitchensartan-hydrochlorothiazide (HYZAAR) 100-25 MG per tablet TAKE 1 TABLET BY MOUTH EVERY DAY FOR BLOOD PRESSURE 90 tablet 3  . metFORMIN (GLUCOPHAGE-XR) 500 MG 24 hr  tablet TAKE 2 TABLETS BY MOUTH TWICE A DAY 360 tablet 4  . NOVOFINE 32G X 6 MM MISC USE AS DIRECTED 4 TIMES A DAY 4 each prn  . omeprazole (PRILOSEC) 20 MG capsule Take 20 mg by mouth 2 (two) times daily with breakfast and lunch.     Marland Kitchen omeprazole (PRILOSEC) 40 MG capsule TAKE ONE CAPSULE BY MOUTH EVERY DAY 90 capsule 3  . simvastatin (ZOCOR) 40 MG tablet TAKE 1 TABLET BY MOUTH AT BEDTIME 90 tablet 0  . sitaGLIPtin (JANUVIA) 100 MG tablet Take 1/2 to 1 tablet daily as directed  for Diabetes 90 tablet 99  . vitamin E (VITAMIN E) 400 UNIT capsule Take 400 Units by mouth daily.    Marland Kitchen zinc gluconate 50 MG tablet Take 50 mg by mouth daily.     No current facility-administered medications on file prior to visit.   Medical History:  Past Medical History  Diagnosis Date  . Phlebitis     30 years ago  left leg  . Anxiety   . GERD (gastroesophageal reflux disease)   . Migraines   . Diabetes mellitus 1998  . Hyperlipidemia   . Hypertension 2000  . Stroke     multiple mini strokes ( brain aneurysm )  . 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 Status post colonoscopy 07/2010  . Abnormal findings on esophagogastroduodenoscopy (EGD) 07/2010  . Peripheral vascular disease    Allergies:  Allergies  Allergen Reactions  . Plavix [Clopidogrel Bisulfate] Palpitations  . Amoxicillin Itching and Swelling    FACE & EYES SWELL  . Ace Inhibitors   . Lyrica [Pregabalin]   . Penicillins Other (See Comments)    REACTION: unspecified     Review of Systems:  Review of Systems  Constitutional: Negative.   HENT: Negative.   Eyes: Negative.   Respiratory: Negative.   Cardiovascular: Negative.   Gastrointestinal: Positive for abdominal pain (diffuse x 4-5 years, positional). Negative for heartburn, nausea, vomiting, diarrhea, constipation, blood in stool and melena.  Genitourinary: Negative.   Musculoskeletal: Negative.   Skin: Negative.   Neurological: Negative.   Psychiatric/Behavioral: Negative.     Family history- Review and unchanged Social history- Review and unchanged Physical Exam: BP 128/64 mmHg  Pulse 80  Temp(Src) 98.1 F (36.7 C)  Resp 16  Ht 5' 6"  (1.676 m)  Wt 145 lb (65.772 kg)  BMI 23.41 kg/m2 Wt Readings from Last 3 Encounters:  09/02/14 145 lb (65.772 kg)  05/13/14 141 lb 1.6 oz (64.003 kg)  04/26/14 140 lb (63.504 kg)   General Appearance: Well nourished, in no apparent distress. Eyes: PERRLA, EOMs, conjunctiva no swelling or erythema Sinuses:  No Frontal/maxillary tenderness ENT/Mouth: Ext aud canals clear, TMs without erythema, bulging. No erythema, swelling, or exudate on post pharynx.  Tonsils not swollen or erythematous. Hearing normal.  Neck: Supple, thyroid normal.  Respiratory: Respiratory effort normal, BS equal bilaterally without rales, rhonchi, wheezing or stridor.  Cardio: RRR with no MRGs. Brisk peripheral pulses without edema.  Abdomen: Soft, + BS.  diffuse tenderness, no guarding, rebound, hernias, masses. Lymphatics: Non tender without lymphadenopathy.  Musculoskeletal: Full ROM, 5/5 strength, normal gait.  Skin: Warm, dry without rashes, lesions, ecchymosis.  Neuro: Cranial nerves intact. No cerebellar symptoms. Sensation intact.  Psych: Awake and oriented X 3, normal affect, Insight and Judgment appropriate.    [name], PA-C 12:56 PM [facility]

## 2014-09-02 NOTE — Patient Instructions (Signed)
    Bad carbs also include fruit juice, alcohol, and sweet tea. These are empty calories that do not signal to your brain that you are full.   Please remember the good carbs are still carbs which convert into sugar. So please measure them out no more than 1/2-1 cup of rice, oatmeal, pasta, and beans  Veggies are however free foods! Pile them on.   Not all fruit is created equal. Please see the list below, the fruit at the bottom is higher in sugars than the fruit at the top. Please avoid all dried fruits.     Recommendations For Diabetic/Prediabetic Patients:   -  Take medications as prescribed  -  Recommend Dr Joel Fuhrman's book "The End of Diabetes "  And "The End of Dieting"- Can get at  www.Amazon.com and encourage also get the Audio CD book  - AVOID Animal products, ie. Meat - red/white, Poultry and Dairy/especially cheese - Exercise at least 5 times a week for 30 minutes or preferably daily.  - No Smoking - Drink less than 2 drinks a day.  - Monitor your feet for sores - Have yearly Eye Exams - Recommend annual Flu vaccine  - Recommend Pneumovax and Prevnar vaccines - Shingles Vaccine (Zostavax) if over 60 y.o.  Goals:   - BMI less than 24 - Fasting sugar less than 130 or less than 150 if tapering medicines to lose weight  - Systolic BP less than 130  - Diastolic BP less than 80 - Bad LDL Cholesterol less than 70 - Triglycerides less than 150  

## 2014-09-02 NOTE — Progress Notes (Signed)
Subjective: Patient presents for foot and nail care.  She is a patient of Dr. Trula Slade.  She has diagnosed PVD and bypass is not recommended for her.  Her stents have not stayed open according to the patient. She has a slight listers corn on the lateral side of the right fifth toe.   Physical Exam  Vascular exam reveals nonpalpable pedal pulses DP or PT bilateral. Shiny atrophic skin is noted bilateral. Absence of digital hair growth is noted, capillary refill time is increased bilateral. Proximal distal cooling it warm to warm bilateral.  Neurological examination reveals decrease in sensation via Semmes Weinstein monofilament at 3/5 sites bilateral, vibratory sensation and light touch are also decreased bilateral,  Musculoskeletal examination reveals acceptable muscle strength and stability bilateral  Dermatological examination reveals minimally elongated toenails one through five bilateral. Minimal hyperkeratotic tissue around the edges of the fifth toenails is also present. Patient complains of painful corns on the top of the left fourth toe   Non-Invasive Vascular Imaging: DATE: 04/26/2014  ABI: RIGHT 0.61, Waveforms: monophasic; LEFT 0.59, Waveforms: monophasic  Previous (10/22/13) ABI's: Right: 0.69, Left: 0.62  Assessment & Plan:   Assessment is peripheral vascular disease being treated by Dr. [name], symptomatic toenails first thru five bilateral, soft corn dorsal left fourth toe   Plan: I performed a careful debridement of all ten toenails at today's visit. Recommended nonmedicated corn pad to alleviate pressure on the left fourth toe. She will be seen back for a recheck in 2-3 months.

## 2014-10-04 ENCOUNTER — Encounter (HOSPITAL_COMMUNITY): Payer: Self-pay

## 2014-10-04 ENCOUNTER — Emergency Department (HOSPITAL_COMMUNITY): Payer: Medicare Other

## 2014-10-04 ENCOUNTER — Inpatient Hospital Stay (HOSPITAL_COMMUNITY): Payer: Medicare Other

## 2014-10-04 ENCOUNTER — Inpatient Hospital Stay (HOSPITAL_COMMUNITY)
Admission: EM | Admit: 2014-10-04 | Discharge: 2014-10-05 | DRG: 093 | Disposition: A | Discharge status: Home / Self Care | Payer: Medicare Other | Attending: Internal Medicine | Admitting: Internal Medicine

## 2014-10-04 DIAGNOSIS — E1122 Type 2 diabetes mellitus with diabetic chronic kidney disease: Secondary | ICD-10-CM | POA: Diagnosis not present

## 2014-10-04 DIAGNOSIS — R51 Headache: Secondary | ICD-10-CM

## 2014-10-04 DIAGNOSIS — I129 Hypertensive chronic kidney disease with stage 1 through stage 4 chronic kidney disease, or unspecified chronic kidney disease: Secondary | ICD-10-CM | POA: Diagnosis present

## 2014-10-04 DIAGNOSIS — F17210 Nicotine dependence, cigarettes, uncomplicated: Secondary | ICD-10-CM | POA: Diagnosis present

## 2014-10-04 DIAGNOSIS — Z79899 Other long term (current) drug therapy: Secondary | ICD-10-CM | POA: Diagnosis not present

## 2014-10-04 DIAGNOSIS — Z7982 Long term (current) use of aspirin: Secondary | ICD-10-CM

## 2014-10-04 DIAGNOSIS — Z720 Tobacco use: Secondary | ICD-10-CM | POA: Diagnosis present

## 2014-10-04 DIAGNOSIS — R202 Paresthesia of skin: Secondary | ICD-10-CM | POA: Diagnosis not present

## 2014-10-04 DIAGNOSIS — R911 Solitary pulmonary nodule: Secondary | ICD-10-CM | POA: Diagnosis present

## 2014-10-04 DIAGNOSIS — Z8673 Personal history of transient ischemic attack (TIA), and cerebral infarction without residual deficits: Secondary | ICD-10-CM | POA: Diagnosis not present

## 2014-10-04 DIAGNOSIS — E1139 Type 2 diabetes mellitus with other diabetic ophthalmic complication: Secondary | ICD-10-CM | POA: Diagnosis present

## 2014-10-04 DIAGNOSIS — Z888 Allergy status to other drugs, medicaments and biological substances status: Secondary | ICD-10-CM | POA: Diagnosis not present

## 2014-10-04 DIAGNOSIS — Z880 Allergy status to penicillin: Secondary | ICD-10-CM

## 2014-10-04 DIAGNOSIS — Z90710 Acquired absence of both cervix and uterus: Secondary | ICD-10-CM

## 2014-10-04 DIAGNOSIS — I369 Nonrheumatic tricuspid valve disorder, unspecified: Secondary | ICD-10-CM | POA: Diagnosis not present

## 2014-10-04 DIAGNOSIS — K219 Gastro-esophageal reflux disease without esophagitis: Secondary | ICD-10-CM | POA: Diagnosis present

## 2014-10-04 DIAGNOSIS — I672 Cerebral atherosclerosis: Secondary | ICD-10-CM | POA: Diagnosis not present

## 2014-10-04 DIAGNOSIS — E1169 Type 2 diabetes mellitus with other specified complication: Secondary | ICD-10-CM | POA: Diagnosis not present

## 2014-10-04 DIAGNOSIS — F40240 Claustrophobia: Secondary | ICD-10-CM | POA: Diagnosis present

## 2014-10-04 DIAGNOSIS — F419 Anxiety disorder, unspecified: Secondary | ICD-10-CM | POA: Diagnosis present

## 2014-10-04 DIAGNOSIS — E785 Hyperlipidemia, unspecified: Secondary | ICD-10-CM | POA: Diagnosis not present

## 2014-10-04 DIAGNOSIS — Z9889 Other specified postprocedural states: Secondary | ICD-10-CM

## 2014-10-04 DIAGNOSIS — N183 Chronic kidney disease, stage 3 (moderate): Secondary | ICD-10-CM

## 2014-10-04 DIAGNOSIS — I639 Cerebral infarction, unspecified: Secondary | ICD-10-CM

## 2014-10-04 DIAGNOSIS — R519 Headache, unspecified: Secondary | ICD-10-CM

## 2014-10-04 DIAGNOSIS — Z9049 Acquired absence of other specified parts of digestive tract: Secondary | ICD-10-CM | POA: Diagnosis present

## 2014-10-04 DIAGNOSIS — G459 Transient cerebral ischemic attack, unspecified: Secondary | ICD-10-CM | POA: Diagnosis not present

## 2014-10-04 DIAGNOSIS — I10 Essential (primary) hypertension: Secondary | ICD-10-CM | POA: Diagnosis not present

## 2014-10-04 DIAGNOSIS — I671 Cerebral aneurysm, nonruptured: Secondary | ICD-10-CM | POA: Diagnosis not present

## 2014-10-04 DIAGNOSIS — I6501 Occlusion and stenosis of right vertebral artery: Secondary | ICD-10-CM | POA: Diagnosis not present

## 2014-10-04 DIAGNOSIS — R270 Ataxia, unspecified: Secondary | ICD-10-CM | POA: Diagnosis present

## 2014-10-04 DIAGNOSIS — I6523 Occlusion and stenosis of bilateral carotid arteries: Secondary | ICD-10-CM | POA: Diagnosis not present

## 2014-10-04 DIAGNOSIS — Z794 Long term (current) use of insulin: Secondary | ICD-10-CM

## 2014-10-04 DIAGNOSIS — G43909 Migraine, unspecified, not intractable, without status migrainosus: Secondary | ICD-10-CM | POA: Diagnosis present

## 2014-10-04 DIAGNOSIS — H409 Unspecified glaucoma: Secondary | ICD-10-CM | POA: Diagnosis present

## 2014-10-04 DIAGNOSIS — H1132 Conjunctival hemorrhage, left eye: Secondary | ICD-10-CM | POA: Diagnosis present

## 2014-10-04 DIAGNOSIS — S299XXA Unspecified injury of thorax, initial encounter: Secondary | ICD-10-CM | POA: Diagnosis not present

## 2014-10-04 DIAGNOSIS — I739 Peripheral vascular disease, unspecified: Secondary | ICD-10-CM | POA: Diagnosis present

## 2014-10-04 DIAGNOSIS — H42 Glaucoma in diseases classified elsewhere: Secondary | ICD-10-CM | POA: Diagnosis present

## 2014-10-04 LAB — CBC
HEMATOCRIT: 45.6 % (ref 36.0–46.0)
Hemoglobin: 15.1 g/dL — ABNORMAL HIGH (ref 12.0–15.0)
MCH: 31.5 pg (ref 26.0–34.0)
MCHC: 33.1 g/dL (ref 30.0–36.0)
MCV: 95.2 fL (ref 78.0–100.0)
Platelets: 327 10*3/uL (ref 150–400)
RBC: 4.79 MIL/uL (ref 3.87–5.11)
RDW: 12.5 % (ref 11.5–15.5)
WBC: 10.7 10*3/uL — ABNORMAL HIGH (ref 4.0–10.5)

## 2014-10-04 LAB — I-STAT CHEM 8, ED
BUN: 20 mg/dL (ref 6–23)
CALCIUM ION: 1.15 mmol/L (ref 1.13–1.30)
Chloride: 100 mEq/L (ref 96–112)
Creatinine, Ser: 1.2 mg/dL — ABNORMAL HIGH (ref 0.50–1.10)
GLUCOSE: 187 mg/dL — AB (ref 70–99)
HCT: 47 % — ABNORMAL HIGH (ref 36.0–46.0)
Hemoglobin: 16 g/dL — ABNORMAL HIGH (ref 12.0–15.0)
POTASSIUM: 3.6 mmol/L (ref 3.5–5.1)
SODIUM: 139 mmol/L (ref 135–145)
TCO2: 26 mmol/L (ref 0–100)

## 2014-10-04 LAB — URINALYSIS, ROUTINE W REFLEX MICROSCOPIC
Bilirubin Urine: NEGATIVE
Glucose, UA: >1000 mg/dL — AB
HGB URINE DIPSTICK: NEGATIVE
KETONES UR: NEGATIVE mg/dL
LEUKOCYTES UA: NEGATIVE
Nitrite: NEGATIVE
PH: 6 (ref 5.0–8.0)
Protein, ur: NEGATIVE mg/dL
Specific Gravity, Urine: 1.023 (ref 1.005–1.030)
Urobilinogen, UA: 0.2 mg/dL (ref 0.0–1.0)

## 2014-10-04 LAB — COMPREHENSIVE METABOLIC PANEL
ALBUMIN: 3.9 g/dL (ref 3.5–5.2)
ALT: 17 U/L (ref 0–35)
AST: 27 U/L (ref 0–37)
Alkaline Phosphatase: 80 U/L (ref 39–117)
Anion gap: 9 (ref 5–15)
BILIRUBIN TOTAL: 0.7 mg/dL (ref 0.3–1.2)
BUN: 19 mg/dL (ref 6–23)
CO2: 31 mmol/L (ref 19–32)
Calcium: 9.3 mg/dL (ref 8.4–10.5)
Chloride: 98 mEq/L (ref 96–112)
Creatinine, Ser: 1.17 mg/dL — ABNORMAL HIGH (ref 0.50–1.10)
GFR calc Af Amer: 55 mL/min — ABNORMAL LOW (ref >90)
GFR, EST NON AFRICAN AMERICAN: 47 mL/min — AB (ref >90)
Glucose, Bld: 186 mg/dL — ABNORMAL HIGH (ref 70–99)
Potassium: 3.7 mmol/L (ref 3.5–5.1)
Sodium: 138 mmol/L (ref 135–145)
Total Protein: 7.6 g/dL (ref 6.0–8.3)

## 2014-10-04 LAB — APTT: APTT: 28 s (ref 24–37)

## 2014-10-04 LAB — PROTIME-INR
INR: 0.89 (ref 0.00–1.49)
Prothrombin Time: 12.1 seconds (ref 11.6–15.2)

## 2014-10-04 LAB — DIFFERENTIAL
BASOS ABS: 0.1 10*3/uL (ref 0.0–0.1)
BASOS PCT: 1 % (ref 0–1)
EOS PCT: 1 % (ref 0–5)
Eosinophils Absolute: 0.1 10*3/uL (ref 0.0–0.7)
LYMPHS ABS: 3.4 10*3/uL (ref 0.7–4.0)
Lymphocytes Relative: 32 % (ref 12–46)
MONO ABS: 0.5 10*3/uL (ref 0.1–1.0)
MONOS PCT: 5 % (ref 3–12)
NEUTROS ABS: 6.7 10*3/uL (ref 1.7–7.7)
Neutrophils Relative %: 61 % (ref 43–77)

## 2014-10-04 LAB — URINE MICROSCOPIC-ADD ON

## 2014-10-04 LAB — GLUCOSE, CAPILLARY: Glucose-Capillary: 121 mg/dL — ABNORMAL HIGH (ref 70–99)

## 2014-10-04 LAB — ETHANOL: Alcohol, Ethyl (B): <5 mg/dL (ref 0–9)

## 2014-10-04 LAB — I-STAT TROPONIN, ED: Troponin i, poc: 0 ng/mL (ref 0.00–0.08)

## 2014-10-04 MED ORDER — SENNOSIDES-DOCUSATE SODIUM 8.6-50 MG PO TABS: 1.0000 | ORAL_TABLET | Freq: Every evening | ORAL | Status: DC | PRN

## 2014-10-04 MED ORDER — STROKE: EARLY STAGES OF RECOVERY BOOK
Freq: Once | Status: AC
  Administered 2014-10-04

## 2014-10-04 MED ORDER — ACETAMINOPHEN 650 MG RE SUPP: 650.0000 mg | RECTAL | Status: DC | PRN

## 2014-10-04 MED ORDER — INSULIN ASPART 100 UNIT/ML ~~LOC~~ SOLN
0.0000 [IU] | SUBCUTANEOUS | Status: DC
  Administered 2014-10-05 (×3): 3 [IU] via SUBCUTANEOUS

## 2014-10-04 MED ORDER — LORAZEPAM 2 MG/ML IJ SOLN
2.0000 mg | Freq: Once | INTRAMUSCULAR | Status: AC
  Administered 2014-10-04: 2 mg via INTRAVENOUS
  Filled 2014-10-04: qty 1

## 2014-10-04 MED ORDER — INSULIN GLARGINE 100 UNIT/ML ~~LOC~~ SOLN
10.0000 [IU] | Freq: Every day | SUBCUTANEOUS | Status: DC
  Administered 2014-10-04: 10 [IU] via SUBCUTANEOUS
  Filled 2014-10-04 (×2): qty 0.1

## 2014-10-04 MED ORDER — ACETAMINOPHEN 325 MG PO TABS: 650.0000 mg | ORAL_TABLET | ORAL | Status: DC | PRN

## 2014-10-04 MED ORDER — NICOTINE 14 MG/24HR TD PT24
14.0000 mg | MEDICATED_PATCH | Freq: Every day | TRANSDERMAL | Status: DC
  Administered 2014-10-05: 14 mg via TRANSDERMAL
  Filled 2014-10-04: qty 1

## 2014-10-04 MED ORDER — CITALOPRAM HYDROBROMIDE 10 MG PO TABS
20.0000 mg | ORAL_TABLET | Freq: Every day | ORAL | Status: DC
  Administered 2014-10-05: 20 mg via ORAL
  Filled 2014-10-04: qty 2

## 2014-10-04 MED ORDER — ASPIRIN 325 MG PO TABS
325.0000 mg | ORAL_TABLET | Freq: Every day | ORAL | Status: DC
  Administered 2014-10-04 – 2014-10-05 (×2): 325 mg via ORAL
  Filled 2014-10-04 (×2): qty 1

## 2014-10-04 MED ORDER — PANTOPRAZOLE SODIUM 40 MG PO TBEC
40.0000 mg | DELAYED_RELEASE_TABLET | Freq: Every day | ORAL | Status: DC
  Administered 2014-10-05: 40 mg via ORAL
  Filled 2014-10-04: qty 1

## 2014-10-04 MED ORDER — SIMVASTATIN 40 MG PO TABS
40.0000 mg | ORAL_TABLET | Freq: Every day | ORAL | Status: DC
  Administered 2014-10-04: 40 mg via ORAL
  Filled 2014-10-04: qty 1

## 2014-10-04 MED ORDER — LATANOPROST 0.005 % OP SOLN
1.0000 [drp] | Freq: Every day | OPHTHALMIC | Status: DC
  Filled 2014-10-04: qty 2.5

## 2014-10-04 MED ORDER — SODIUM CHLORIDE 0.9 % IV SOLN
INTRAVENOUS | Status: DC
  Administered 2014-10-04: 23:00:00 via INTRAVENOUS

## 2014-10-04 MED ORDER — ALPRAZOLAM 0.5 MG PO TABS: 0.5000 mg | ORAL_TABLET | Freq: Once | ORAL | Status: DC

## 2014-10-04 NOTE — ED Notes (Addendum)
[facility] contacted for transprt, report called to [facility]

## 2014-10-04 NOTE — ED Notes (Signed)
[facility] here for transport, no changes.

## 2014-10-04 NOTE — Progress Notes (Signed)
Pt admitted from [facility] c/o of slight sensation on her head, alert and oriented,pt settled in bed,call light at bedside,will however continue to monitor. Obasogie-Asidi, Jaqlyn Gruenhagen Efe

## 2014-10-04 NOTE — ED Notes (Signed)
No changes, up to b/r, steady gait.

## 2014-10-04 NOTE — ED Notes (Signed)
Given saltine crackers and water (swallow screen previously passed)

## 2014-10-04 NOTE — ED Notes (Signed)
Pt states since 2 days ago.  Weakness .  Headache. HX of stroke and aneurysm.  Pt has been tired and having neck pain.

## 2014-10-04 NOTE — H&P (Signed)
PCP:  Alesia Richards, MD  Neurology [facility] [phone]  Chief Complaint:  ataxia  HPI: [name] is a 69 y.o. female   has a past medical history of Phlebitis; Anxiety; GERD (gastroesophageal reflux disease); Migraines; Diabetes mellitus (1998); Hyperlipidemia; Hypertension (2000); Stroke; Aneurysm (2013); Status post colonoscopy (07/2010); Abnormal findings on esophagogastroduodenoscopy (EGD) (07/2010); and Peripheral vascular disease.   Presented with  For the past 2-3 days she have had some uncomfortable sensation in the back of her neck and top of her head. She reports fatigue for a few days. She has been unbalanced for the past few weeks. Yesterday she noted that she had a hemorrhage to the conjunctiva of Left eye and that worried her to seek medical attention.  Patient has hx of CVA in the past but could not tolerate plavix due to palpitations. She has known hx of cerebral aneurism diagnosed in 2008 but due to small size it has been managed conservatively. She has noted worsening of her migraine headaches. She presented to [facility] ER and CT head showed New wedge-shaped low density is noted peripherally in the right cerebellar hemisphere most consistent with infarction of indeterminate age. Neurology has been consulted and recomended transfer to [facility] for CVA work up. Verdis Frederickson has been ordered. Patient has history of claustrophobia. We will attempt to treat with Xanax prior to MRI if cannot tolerate patient will need open MRI.   Hospitalist was called for admission for Cerebellar CVA  Review of Systems:    Pertinent positives include: ataxia, headaches, fatigue, confusion  Constitutional:  No weight loss, night sweats, Fevers, chills,  weight loss  HEENT:  No headaches, Difficulty swallowing,Tooth/dental problems,Sore throat,  No sneezing, itching, ear ache, nasal congestion, post nasal drip,  Cardio-vascular:  No chest pain, Orthopnea, PND, anasarca, dizziness, palpitations.no  Bilateral lower extremity swelling  GI:  No heartburn, indigestion, abdominal pain, nausea, vomiting, diarrhea, change in bowel habits, loss of appetite, melena, blood in stool, hematemesis Resp:  no shortness of breath at rest. No dyspnea on exertion, No excess mucus, no productive cough, No non-productive cough, No coughing up of blood.No change in color of mucus.No wheezing. Skin:  no rash or lesions. No jaundice GU:  no dysuria, change in color of urine, no urgency or frequency. No straining to urinate.  No flank pain.  Musculoskeletal:  No joint pain or no joint swelling. No decreased range of motion. No back pain.  Psych:  No change in mood or affect. No depression or anxiety. No memory loss.  Neuro: no localizing neurological complaints, no tingling, no weakness, no double vision, no gait abnormality, no slurred speech   Otherwise ROS are negative except for above, 10 systems were reviewed  Past Medical History: Past Medical History  Diagnosis Date  . Phlebitis     30 years ago  left leg  . Anxiety   . GERD (gastroesophageal reflux disease)   . Migraines   . Diabetes mellitus 1998  . Hyperlipidemia   . Hypertension 2000  . Stroke     multiple mini strokes ( brain aneurysm )  . 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 Status post colonoscopy 07/2010  . Abnormal findings on esophagogastroduodenoscopy (EGD) 07/2010  . Peripheral vascular disease    Past Surgical History  Procedure Laterality Date  . Cesarean section    . Cholecystectomy    . Knee surgery    . Rotator cuff surgery    . Thyroid surgery    .  Hernia repair      times two  . Abdominal hysterectomy  1984  . Femoral artery stent  05/11/11    Left superficial femoral and popliteal artery  . Dental surgery  Aug. 16, 2013    left lower   . Femoral-popliteal bypass graft  Nov. 26, 2013    Left Angiogram-   . Eye surgery  Nov. 2014    Laser-Glaucoma  . Esophagogastroduodenoscopy  07/2010  . Colonoscopy   07/2010  . Abdominal aortagram N/A 01/04/2012    Procedure: ABDOMINAL Maxcine Ham;  Surgeon: Serafina Mitchell, MD;  Location: [facility] CATH LAB;  Service: Cardiovascular;  Laterality: N/A;  . Abdominal aortagram N/A 08/15/2012    Procedure: ABDOMINAL AORTAGRAM;  Surgeon: Serafina Mitchell, MD;  Location: [facility] CATH LAB;  Service: Cardiovascular;  Laterality: N/A;  . Lower extremity angiogram Left 08/15/2012    Procedure: LOWER EXTREMITY ANGIOGRAM;  Surgeon: Serafina Mitchell, MD;  Location: [facility] CATH LAB;  Service: Cardiovascular;  Laterality: Left;  lt leg angio     Medications: Prior to Admission medications   Medication Sig Start Date End Date Taking? Authorizing Provider  aspirin 325 MG tablet Take 325 mg by mouth daily.    Yes Historical Provider, MD  Blood Glucose Monitoring Suppl (ACCU-CHEK ACTIVE CARE KIT) KIT 1 Device by Does not apply route 3 (three) times daily. 07/30/13  Yes [name], MD  Canagliflozin (INVOKANA) 300 MG TABS Take 1/2 tyo 1 tablet daily as directed for Diabetes Patient taking differently: Take 150 mg by mouth daily. Take 1/2 tyo 1 tablet daily as directed for Diabetes 05/13/14 05/14/15 Yes [name], MD  Cholecalciferol (VITAMIN D3) 5000 UNITS TABS Take 1 capsule by mouth daily.   Yes Historical Provider, MD  CINNAMON PO Take 1,000 mg by mouth 2 (two) times daily.    Yes Historical Provider, MD  citalopram (CELEXA) 20 MG tablet Take 20 mg by mouth daily.  02/08/14  Yes Historical Provider, MD  glucose blood test strip 1 each by Other route 3 (three) times daily. Dispense 3 month supply Accu Check testing strips 3 boxes of 100   Yes Historical Provider, MD  Insulin Aspart Prot & Aspart (NOVOLOG 70/30 MIX) (70-30) 100 UNIT/ML Pen Take up to 15 units  2 x daily by sliding scale as directed for Diabetes Patient taking differently: Inject 10-12 Units into the skin 2 (two) times daily. Take 12 units in the morning and 10 units at bedtime adjust with sliding scale as directed for  Diabetes 05/13/14 05/14/15 Yes [name], MD  IRON PO Take by mouth daily.   Yes Historical Provider, MD  latanoprost (XALATAN) 0.005 % ophthalmic solution Place 1 drop into both eyes at bedtime.    Yes Historical Provider, MD  losartan-hydrochlorothiazide (HYZAAR) 100-25 MG per tablet TAKE 1 TABLET BY MOUTH EVERY DAY FOR BLOOD PRESSURE   Yes [name], PA-C  metFORMIN (GLUCOPHAGE-XR) 500 MG 24 hr tablet TAKE 2 TABLETS BY MOUTH TWICE A DAY 03/16/14  Yes [name], MD  NOVOFINE 32G X 6 MM MISC USE AS DIRECTED 4 TIMES A DAY 05/13/14  Yes [name], PA-C  Omega-3 Fatty Acids (FISH OIL) 1200 MG CAPS Take 1 capsule by mouth daily.   Yes Historical Provider, MD  omeprazole (PRILOSEC) 40 MG capsule TAKE ONE CAPSULE BY MOUTH EVERY DAY 07/12/14  Yes [name], MD  simvastatin (ZOCOR) 40 MG tablet TAKE 1 TABLET BY MOUTH AT BEDTIME 07/18/14  Yes [name], MD  sitaGLIPtin [facility])  100 MG tablet Take 1/2 to 1 tablet daily as directed for Diabetes Patient taking differently: Take 100 mg by mouth daily.  05/13/14 05/14/15 Yes [name], MD  vitamin E (VITAMIN E) 400 UNIT capsule Take 400 Units by mouth daily.   Yes Historical Provider, MD  zinc gluconate 50 MG tablet Take 50 mg by mouth daily.   Yes Historical Provider, MD    Allergies:   Allergies  Allergen Reactions  . Plavix [Clopidogrel Bisulfate] Palpitations  . Amoxicillin Itching and Swelling    FACE & EYES SWELL  . Ace Inhibitors   . Lyrica [Pregabalin]   . Penicillins Other (See Comments)    REACTION: unspecified    Social History:  Ambulatory   Independently  Lives at home  With family     reports that she has been smoking Cigarettes.  She has a 10 pack-year smoking history. She has never used smokeless tobacco. She reports that she does not drink alcohol or use illicit drugs.    Family History: family history includes CAD (age of onset: 42) in her brother; CAD (age of onset: 65) in her father and  mother; Diabetes in her sister; Heart attack in her brother, brother, father, and mother; Heart disease in her brother, brother, father, and mother; Hypertension in her mother and sister.    Physical Exam: Patient Vitals for the past 24 hrs:  BP Temp Temp src Pulse Resp SpO2  10/04/14 1657 - 98.2 F (36.8 C) - - - -  10/04/14 1536 134/80 mmHg 98.2 F (36.8 C) Oral 80 18 93 %    1. General:  in No Acute distress 2. Psychological: Alert and   Oriented 3. Head/ENT:   Moist Mucous Membranes                          Head Non traumatic, neck supple                          Normal  Dentition 4. SKIN:   decreased Skin turgor,  Skin clean Dry and intact no rash 5. Heart: Regular rate and rhythm no Murmur, Rub or gallop 6. Lungs: Clear to auscultation bilaterally, no wheezes or crackles   7. Abdomen: Soft, non-tender, Non distended 8. Lower extremities: no clubbing, cyanosis, or edema 9. Neurologically strength 5 out of 5 in no 4 extremities cranial nerves II through XII intact. Patient still endorses ability to grip things to avoid staggering  10. MSK: Normal range of motion  body mass index is unknown because there is no weight on file.   Labs on Admission:   Results for orders placed or performed during the hospital encounter of 10/04/14 (from the past 24 hour(s))  Ethanol     Status: None   Collection Time: 10/04/14  3:45 PM  Result Value Ref Range   Alcohol, Ethyl (B) <5 0 - 9 mg/dL  Protime-INR     Status: None   Collection Time: 10/04/14  3:45 PM  Result Value Ref Range   Prothrombin Time 12.1 11.6 - 15.2 seconds   INR 0.89 0.00 - 1.49  APTT     Status: None   Collection Time: 10/04/14  3:45 PM  Result Value Ref Range   aPTT 28 24 - 37 seconds  CBC     Status: Abnormal   Collection Time: 10/04/14  3:45 PM  Result Value Ref Range   WBC 10.7 (H)  4.0 - 10.5 K/uL   RBC 4.79 3.87 - 5.11 MIL/uL   Hemoglobin 15.1 (H) 12.0 - 15.0 g/dL   HCT 45.6 36.0 - 46.0 %   MCV 95.2 78.0 -  100.0 fL   MCH 31.5 26.0 - 34.0 pg   MCHC 33.1 30.0 - 36.0 g/dL   RDW 12.5 11.5 - 15.5 %   Platelets 327 150 - 400 K/uL  Differential     Status: None   Collection Time: 10/04/14  3:45 PM  Result Value Ref Range   Neutrophils Relative % 61 43 - 77 %   Neutro Abs 6.7 1.7 - 7.7 K/uL   Lymphocytes Relative 32 12 - 46 %   Lymphs Abs 3.4 0.7 - 4.0 K/uL   Monocytes Relative 5 3 - 12 %   Monocytes Absolute 0.5 0.1 - 1.0 K/uL   Eosinophils Relative 1 0 - 5 %   Eosinophils Absolute 0.1 0.0 - 0.7 K/uL   Basophils Relative 1 0 - 1 %   Basophils Absolute 0.1 0.0 - 0.1 K/uL  Comprehensive metabolic panel     Status: Abnormal   Collection Time: 10/04/14  3:45 PM  Result Value Ref Range   Sodium 138 135 - 145 mmol/L   Potassium 3.7 3.5 - 5.1 mmol/L   Chloride 98 96 - 112 mEq/L   CO2 31 19 - 32 mmol/L   Glucose, Bld 186 (H) 70 - 99 mg/dL   BUN 19 6 - 23 mg/dL   Creatinine, Ser 1.17 (H) 0.50 - 1.10 mg/dL   Calcium 9.3 8.4 - 10.5 mg/dL   Total Protein 7.6 6.0 - 8.3 g/dL   Albumin 3.9 3.5 - 5.2 g/dL   AST 27 0 - 37 U/L   ALT 17 0 - 35 U/L   Alkaline Phosphatase 80 39 - 117 U/L   Total Bilirubin 0.7 0.3 - 1.2 mg/dL   GFR calc non Af Amer 47 (L) >90 mL/min   GFR calc Af Amer 55 (L) >90 mL/min   Anion gap 9 5 - 15  I-Stat Troponin, ED (not at [facility])     Status: None   Collection Time: 10/04/14  3:54 PM  Result Value Ref Range   Troponin i, poc 0.00 0.00 - 0.08 ng/mL   Comment 3          I-Stat Chem 8, ED     Status: Abnormal   Collection Time: 10/04/14  3:56 PM  Result Value Ref Range   Sodium 139 135 - 145 mmol/L   Potassium 3.6 3.5 - 5.1 mmol/L   Chloride 100 96 - 112 mEq/L   BUN 20 6 - 23 mg/dL   Creatinine, Ser 1.20 (H) 0.50 - 1.10 mg/dL   Glucose, Bld 187 (H) 70 - 99 mg/dL   Calcium, Ion 1.15 1.13 - 1.30 mmol/L   TCO2 26 0 - 100 mmol/L   Hemoglobin 16.0 (H) 12.0 - 15.0 g/dL   HCT 47.0 (H) 36.0 - 46.0 %    UA glucose in urine  Lab Results  Component Value Date   HGBA1C 9.3*  09/02/2014    CrCl cannot be calculated (Unknown ideal weight.).  BNP (last 3 results) No results for input(s): PROBNP in the last 8760 hours.  Other results:  I have pearsonaly reviewed this: ECG REPORT  Rate: 69  Rhythm: NSR ST&T Change: no ischemic changes    There were no vitals filed for this visit.   Cultures: No results found for: [place], [place], CULT,  REPTSTATUS   Radiological Exams on Admission: Ct Head Wo Contrast  10/04/2014   CLINICAL DATA:  Headache for 2 days.  No known injury.  EXAM: CT HEAD WITHOUT CONTRAST  TECHNIQUE: Contiguous axial images were obtained from the base of the skull through the vertex without intravenous contrast.  COMPARISON:  CT scan of October 23, 2003.  FINDINGS: Bony calvarium appears intact. Minimal diffuse cortical atrophy is noted. No mass effect or midline shift is noted. Ventricular size is within normal limits. There is no evidence of mass lesion or hemorrhage. New wedge-shaped low density is noted peripherally in the right cerebellar hemisphere most consistent with infarction of indeterminate age.  IMPRESSION: New wedge-shaped low density is noted peripherally in the right cerebellar hemisphere most consistent with infarction of indeterminate age. MRI is recommended for further evaluation.   Electronically Signed   By: Sabino Dick M.D.   On: 10/04/2014 16:30    Chart has been reviewed  Assessment/Plan  69 year old female with prior history of CVA currently on aspirin 325 presents with ataxia for the past few weeks CT scan showing intermediate timing cerebellar CVA. Patient has history of diabetes, hypertension, peripheral vascular disease. Known cerebral aneurysm has been managed conservatively due to its small size  Present on Admission:  . Cerebellar infarction - transfer to [facility] ,  - will admit based on TIA/CVA protocol, await results of MRA/MRI, Carotid Doppler and Echo, obtain cardiac enzymes,  ECG,   Lipid panel, TSH.  Order PT/OT evaluation. Will make sure patient is on antiplatelet agent.   Neurology consult.     . CEREBRAL ANEURYSM - obtain MRA to evaluate further  . CKD stage 3 due to type 2 diabetes mellitus- chronic continue to monitor  . Essential hypertension - hold on blood pressure meds still low permissive hypertension  . Glaucoma- continue home medications  Conjunctival hemorrhage spontaneous -conservative management . Hyperlipidemia associated with type 2 diabetes mellitus - switched to Lantus 10 units while inpatient hold Januvia  . Tobacco abuse - spoke about importance of quitting   Prophylaxis: SCD , Protonix  CODE STATUS:  limited code does not wish resuscitation chest compressions or intubation  Other plan as per orders.  I have spent a total of 55 min on this admission  Alva Broxson 10/04/2014, 6:55 PM  [facility]  Pager [phone]   after 2 AM please page floor coverage PA If 7AM-7PM, please contact the day team taking care of the patient  [email or web address]  Password [email or web address]

## 2014-10-04 NOTE — ED Provider Notes (Signed)
CSN: [id number]     Arrival date & time 10/04/14  1508 History   First MD Initiated Contact with Patient 10/04/14 1529     No chief complaint on file.    (Consider location/radiation/quality/duration/timing/severity/associated sxs/prior Treatment) HPI  Pt presents with c/o posterior headache and pain in the right side of her neck which has been ongoing for the past 2 days.  She also states she has had generalized weakness and has felt unsteady on her feet for several weeks.  No vertigo.  No lightheadedness or fainting.  No focal weakness, no changes in vision or speech.  She also noted yesterday that her left eye had a burst blood vessel in it and was bright red.  This concerned her as she has a hx of aneurysm and prior strokes.  Headache and neck pain were gradual in onset, neck pain feels like a soreness.  Headache is constant.  There are no other associated systemic symptoms, there are no other alleviating or modifying factors.   Past Medical History  Diagnosis Date  . Phlebitis     30 years ago  left leg  . Anxiety   . GERD (gastroesophageal reflux disease)   . Migraines   . Diabetes mellitus 1998  . Hyperlipidemia   . Hypertension 2000  . Stroke     multiple mini strokes ( brain aneurysm )  . 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 Status post colonoscopy 07/2010  . Abnormal findings on esophagogastroduodenoscopy (EGD) 07/2010  . Peripheral vascular disease    Past Surgical History  Procedure Laterality Date  . Cesarean section    . Cholecystectomy    . Knee surgery    . Rotator cuff surgery    . Thyroid surgery    . Hernia repair      times two  . Abdominal hysterectomy  1984  . Femoral artery stent  05/11/11    Left superficial femoral and popliteal artery  . Dental surgery  Aug. 16, 2013    left lower   . Femoral-popliteal bypass graft  Nov. 26, 2013    Left Angiogram-   . Eye surgery  Nov. 2014    Laser-Glaucoma  . Esophagogastroduodenoscopy  07/2010  . Colonoscopy   07/2010  . Abdominal aortagram N/A 01/04/2012    Procedure: ABDOMINAL Maxcine Ham;  Surgeon: Serafina Mitchell, MD;  Location: [facility] CATH LAB;  Service: Cardiovascular;  Laterality: N/A;  . Abdominal aortagram N/A 08/15/2012    Procedure: ABDOMINAL AORTAGRAM;  Surgeon: Serafina Mitchell, MD;  Location: [facility] CATH LAB;  Service: Cardiovascular;  Laterality: N/A;  . Lower extremity angiogram Left 08/15/2012    Procedure: LOWER EXTREMITY ANGIOGRAM;  Surgeon: Serafina Mitchell, MD;  Location: [facility] CATH LAB;  Service: Cardiovascular;  Laterality: Left;  lt leg angio   Family History  Problem Relation Age of Onset  . CAD Mother 39    Died of MI  . Hypertension Mother   . Heart attack Mother   . Heart disease Mother   . CAD Father 34    Died of MI  . Heart disease Father   . Heart attack Father   . CAD Brother 93    Two brothers died of MI  . Heart attack Brother   . Heart disease Brother     Amputation  . Diabetes Sister   . Hypertension Sister   . Heart attack Brother   . Heart disease Brother    History  Substance Use Topics  .  Smoking status: Current Every Day Smoker -- 0.25 packs/day for 40 years    Types: Cigarettes  . Smokeless tobacco: Never Used     Comment: pt states that she will try to smoke without any aid  . Alcohol Use: No   OB History    No data available     Review of Systems  ROS reviewed and all otherwise negative except for mentioned in HPI    Allergies  Plavix; Amoxicillin; Ace inhibitors; Lyrica; and Penicillins  Home Medications   Prior to Admission medications   Medication Sig Start Date End Date Taking? Authorizing Provider  aspirin 325 MG tablet Take 325 mg by mouth daily.    Yes Historical Provider, MD  Blood Glucose Monitoring Suppl (ACCU-CHEK ACTIVE CARE KIT) KIT 1 Device by Does not apply route 3 (three) times daily. 07/30/13  Yes [name], MD  Canagliflozin (INVOKANA) 300 MG TABS Take 1/2 tyo 1 tablet daily as directed for Diabetes Patient taking  differently: Take 150 mg by mouth daily. Take 1/2 tyo 1 tablet daily as directed for Diabetes 05/13/14 05/14/15 Yes [name], MD  Cholecalciferol (VITAMIN D3) 5000 UNITS TABS Take 1 capsule by mouth daily.   Yes Historical Provider, MD  CINNAMON PO Take 1,000 mg by mouth 2 (two) times daily.    Yes Historical Provider, MD  citalopram (CELEXA) 20 MG tablet Take 20 mg by mouth daily.  02/08/14  Yes Historical Provider, MD  glucose blood test strip 1 each by Other route 3 (three) times daily. Dispense 3 month supply Accu Check testing strips 3 boxes of 100   Yes Historical Provider, MD  Insulin Aspart Prot & Aspart (NOVOLOG 70/30 MIX) (70-30) 100 UNIT/ML Pen Take up to 15 units  2 x daily by sliding scale as directed for Diabetes Patient taking differently: Inject 10-12 Units into the skin 2 (two) times daily. Take 12 units in the morning and 10 units at bedtime adjust with sliding scale as directed for Diabetes 05/13/14 05/14/15 Yes [name], MD  IRON PO Take by mouth daily.   Yes Historical Provider, MD  latanoprost (XALATAN) 0.005 % ophthalmic solution Place 1 drop into both eyes at bedtime.    Yes Historical Provider, MD  losartan-hydrochlorothiazide (HYZAAR) 100-25 MG per tablet TAKE 1 TABLET BY MOUTH EVERY DAY FOR BLOOD PRESSURE   Yes [name], PA-C  metFORMIN (GLUCOPHAGE-XR) 500 MG 24 hr tablet TAKE 2 TABLETS BY MOUTH TWICE A DAY 03/16/14  Yes [name], MD  NOVOFINE 32G X 6 MM MISC USE AS DIRECTED 4 TIMES A DAY 05/13/14  Yes [name], PA-C  Omega-3 Fatty Acids (FISH OIL) 1200 MG CAPS Take 1 capsule by mouth daily.   Yes Historical Provider, MD  omeprazole (PRILOSEC) 40 MG capsule TAKE ONE CAPSULE BY MOUTH EVERY DAY 07/12/14  Yes [name], MD  simvastatin (ZOCOR) 40 MG tablet TAKE 1 TABLET BY MOUTH AT BEDTIME 07/18/14  Yes [name], MD  sitaGLIPtin (JANUVIA) 100 MG tablet Take 1/2 to 1 tablet daily as directed for Diabetes Patient taking differently: Take  100 mg by mouth daily.  05/13/14 05/14/15 Yes [name], MD  vitamin E (VITAMIN E) 400 UNIT capsule Take 400 Units by mouth daily.   Yes Historical Provider, MD  zinc gluconate 50 MG tablet Take 50 mg by mouth daily.   Yes Historical Provider, MD  UNABLE TO FIND Outpatient OT 10/05/14   Caren Griffins, MD   BP 133/49 mmHg  Pulse 69  Temp(Src) 98.6 F (37 C) (Oral)  Resp 18  Ht _0  (1.676 m)  Wt 144 lb 13.5 oz (65.7 kg)  BMI 23.39 kg/m2  SpO2 96%  Vitals reviewed Physical Exam  Physical Examination: General appearance - alert, well appearing, and in no distress Mental status - alert, oriented to person, place, and time Eyes - pupils equal and reactive, extraocular eye movements intact Mouth - mucous membranes moist, pharynx normal without lesions Neck - supple, no significant adenopathy Chest - clear to auscultation, no wheezes, rales or rhonchi, symmetric air entry Heart - normal rate, regular rhythm, normal S1, S2, no murmurs, rubs, clicks or gallops Abdomen - soft, nontender, nondistended, no masses or organomegaly Neurological - alert, oriented x 3, cranial nerves 2-12 tested and intact, strength 5/5 in extremities x 4, sensation intact Extremities - peripheral pulses normal, no pedal edema, no clubbing or cyanosis Skin - normal coloration and turgor, no rashes  ED Course  Procedures (including critical care time)  5:44 PM d/w Dr. Nicole Kindred, neurology, he recommends hospitalist admission and transfer to [facility].   6:37 PM d/w Dr. Roel Cluck, triad- pt will be transferred to [facility] to hospitalist service there.   Labs Review Labs Reviewed  CBC - Abnormal; Notable for the following:    WBC 10.7 (*)    Hemoglobin 15.1 (*)    All other components within normal limits  COMPREHENSIVE METABOLIC PANEL - Abnormal; Notable for the following:    Glucose, Bld 186 (*)    Creatinine, Ser 1.17 (*)    GFR calc non Af Amer 47 (*)    GFR calc Af Amer 55 (*)    All other components within  normal limits  URINALYSIS, ROUTINE W REFLEX MICROSCOPIC - Abnormal; Notable for the following:    Glucose, UA >1000 (*)    All other components within normal limits  HEMOGLOBIN A1C - Abnormal; Notable for the following:    Hgb A1c MFr Bld 8.7 (*)    Mean Plasma Glucose 203 (*)    All other components within normal limits  LIPID PANEL - Abnormal; Notable for the following:    Triglycerides 321 (*)    HDL 26 (*)    VLDL 64 (*)    All other components within normal limits  GLUCOSE, CAPILLARY - Abnormal; Notable for the following:    Glucose-Capillary 121 (*)    All other components within normal limits  GLUCOSE, CAPILLARY - Abnormal; Notable for the following:    Glucose-Capillary 213 (*)    All other components within normal limits  GLUCOSE, CAPILLARY - Abnormal; Notable for the following:    Glucose-Capillary 203 (*)    All other components within normal limits  GLUCOSE, CAPILLARY - Abnormal; Notable for the following:    Glucose-Capillary 207 (*)    All other components within normal limits  I-STAT CHEM 8, ED - Abnormal; Notable for the following:    Creatinine, Ser 1.20 (*)    Glucose, Bld 187 (*)    Hemoglobin 16.0 (*)    HCT 47.0 (*)    All other components within normal limits  ETHANOL  PROTIME-INR  APTT  DIFFERENTIAL  URINE MICROSCOPIC-ADD ON  TROPONIN I  I-STAT TROPOININ, ED  I-STAT TROPOININ, ED    Imaging Review Ct Angio Head W/cm &/or Wo Cm  10/05/2014   CLINICAL DATA:  Initial evaluation for abnormal head sensation, headaches, chronic imbalance. New subacute to chronic infarct seen on prior CT.  EXAM: CT ANGIOGRAPHY HEAD AND NECK  TECHNIQUE:  Multidetector CT imaging of the head and neck was performed using the standard protocol during bolus administration of intravenous contrast. Multiplanar CT image reconstructions and MIPs were obtained to evaluate the vascular anatomy. Carotid stenosis measurements (when applicable) are obtained utilizing NASCET criteria, using  the distal internal carotid diameter as the denominator.  CONTRAST:  55m OMNIPAQUE IOHEXOL 350 MG/ML SOLN  COMPARISON:  Prior CT from 10/04/2014 as well as previous MRI from 12/25/2012  FINDINGS: CTA NECK  Aortic arch: Visualized aortic arch is of normal caliber with normal 3 vessel morphology. Moderate atheromatous plaque present within the aortic arch and at the origin of the great vessels. Associated stenosis at the proximal left common carotid artery estimated to be in 50% by NASCET criteria. There is mild stenosis of the proximal left subclavian artery of approximately 20%.  Subclavian arteries widely patent.  Right carotid system: Right common carotid artery well opacified to the bifurcation. There is moderate atheromatous plaque about the right carotid bifurcation. Calcified and noncalcified atheromatous disease in the proximal right ICA on resultant multi focal irregular stenosis of up to 30% by NASCET criteria. Right ICA otherwise well opacified to the skullbase.  Prominent atheromatous plaque present at the origin of the right external carotid artery without hemodynamically significant stenosis.  Left carotid system: Approximately 50% stenosis at the origin of the left common carotid artery. Moderate plaque within the mid and distal left common carotid artery with resultant fairly long segment stenosis above 50-60%. Prominent plaque present about the carotid bifurcation. Atheromatous plaque at the origin of the left ICA results in mild narrowing of approximately 30% by NASCET criteria. Left ICA well opacified to the skullbase distally.  Left external carotid arteries and its branches within normal limits.  Vertebral arteries: Right mild calcified atheromatous disease present within the proximal right vertebral artery without significant stenosis. Right vertebral artery well opacified to the skullbase.  There is scattered calcified plaque within name mid left vertebral artery. There is a short-segment  stenosis of approximately 50% at the level of C4. Additional plaque present more proximally at the level of C6-7 with resultant mild stenosis. Atheromatous disease present of the origin of left vertebral artery.  No evidence for vertebral artery dissection.  No soft tissue mild centrilobular emphysematous changes within the visualized upper lobes.  Reversal of the normal cervical lordosis with multilevel degenerative disc disease. No acute osseous abnormality. No worrisome lytic or blastic osseous lesions.  CTA HEAD  Anterior circulation: The petrous segments of the internal carotid arteries well opacified bilaterally. Small 3 mm focal outpouching extending from the proximal aspect of the cavernous right ICA grossly stable (series 404, image 115). This aneurysm projects anteriorly and inferiorly. Calcified plaque present within the cavernous carotid arteries without significant stenosis. Supra clinoid segments widely patent. A1 segments, anterior communicating artery common anterior cerebral arteries well opacified.  M1 segments widely patent bilaterally. There is mild narrowing of the distal left M1 segment, similar to prior. Mild distal branch irregularity present within the distal MCA branches. No proximal branch occlusion.  Left vertebral artery well opacified to the vertebrobasilar junction. Short-segment stenosis of approximately 50% within the distal right vertebral artery just prior to the vertebrobasilar junction. Posterior inferior cerebral arteries patent bilaterally. Basilar artery widely patent. Superior cerebellar arteries patent. Moderate multi focal atherosclerotic irregularity present within the P2 segments bilaterally, left greater than right.  Venous sinuses: No acute abnormality within the venous sinuses.  Delayed phase: No abnormal enhancement on delayed sequence.  IMPRESSION: CTA NECK  IMPRESSION:  1. No acute abnormality within the major articular vasculature of the neck. No evidence for  vertebral artery dissection. 2. Moderate atheromatous plaque in the aortic arch and at the origin of the great vessels with associated 50% stenosis at the origin of the left common carotid artery. 3. Atheromatous plaque within the mid and distal left common carotid artery with associated fairly long segment stenosis of up to 50%. 4. Calcified plaque at the origin of the internal carotid arteries with associated stenoses of approximately 30% bilaterally. 5. Multi focal plaque within the left vertebral artery with associated stenosis of approximately 50% at the level of C4.  CTA HEAD IMPRESSION:  1. No proximal branch occlusion or hemodynamically significant stenosis within the intracranial circulation. 2. Stable focal stenosis of approximately 50% within the distal right vertebral artery just prior to the vertebrobasilar junction. 3. Similar multi focal atherosclerotic irregularity within the distal MCA and PCA branches bilaterally. 4. 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 This is directed anteriorly and inferiorly.   Electronically Signed   By: Jeannine Boga M.D.   On: 10/05/2014 07:47   Dg Chest 2 View  10/04/2014   CLINICAL DATA:  Status post CVA. Head and neck tingling. Initial encounter.  EXAM: CHEST  2 VIEW  COMPARISON:  Chest radiograph performed 08/01/2012  FINDINGS: The lungs are well-aerated. A calcified granuloma is noted at the right midlung zone. There is a nonspecific 5 mm nodule at the left midlung zone. There is no evidence of pleural effusion or pneumothorax.  The heart is normal in size; the mediastinal contour is within normal limits. No acute osseous abnormalities are seen. Postoperative change is noted about the upper abdomen.  IMPRESSION: Nonspecific 5 mm nodule at the left midlung zone. Lungs otherwise clear. This could simply reflect a bone island within the overlying rib; CT of the chest could be considered on an elective nonemergent basis.    Electronically Signed   By: Garald Balding M.D.   On: 10/04/2014 22:22   Ct Head Wo Contrast  10/04/2014   CLINICAL DATA:  Headache for 2 days.  No known injury.  EXAM: CT HEAD WITHOUT CONTRAST  TECHNIQUE: Contiguous axial images were obtained from the base of the skull through the vertex without intravenous contrast.  COMPARISON:  CT scan of October 23, 2003.  FINDINGS: Bony calvarium appears intact. Minimal diffuse cortical atrophy is noted. No mass effect or midline shift is noted. Ventricular size is within normal limits. There is no evidence of mass lesion or hemorrhage. New wedge-shaped low density is noted peripherally in the right cerebellar hemisphere most consistent with infarction of indeterminate age.  IMPRESSION: New wedge-shaped low density is noted peripherally in the right cerebellar hemisphere most consistent with infarction of indeterminate age. MRI is recommended for further evaluation.   Electronically Signed   By: Sabino Dick M.D.   On: 10/04/2014 16:30   Ct Angio Neck W/cm &/or Wo/cm  10/05/2014   CLINICAL DATA:  Initial evaluation for abnormal head sensation, headaches, chronic imbalance. New subacute to chronic infarct seen on prior CT.  EXAM: CT ANGIOGRAPHY HEAD AND NECK  TECHNIQUE: Multidetector CT imaging of the head and neck was performed using the standard protocol during bolus administration of intravenous contrast. Multiplanar CT image reconstructions and MIPs were obtained to evaluate the vascular anatomy. Carotid stenosis measurements (when applicable) are obtained utilizing NASCET criteria, using the distal internal carotid diameter as the denominator.  CONTRAST:  49mL OMNIPAQUE  IOHEXOL 350 MG/ML SOLN  COMPARISON:  Prior CT from 10/04/2014 as well as previous MRI from 12/25/2012  FINDINGS: CTA NECK  Aortic arch: Visualized aortic arch is of normal caliber with normal 3 vessel morphology. Moderate atheromatous plaque present within the aortic arch and at the origin of the  great vessels. Associated stenosis at the proximal left common carotid artery estimated to be in 50% by NASCET criteria. There is mild stenosis of the proximal left subclavian artery of approximately 20%.  Subclavian arteries widely patent.  Right carotid system: Right common carotid artery well opacified to the bifurcation. There is moderate atheromatous plaque about the right carotid bifurcation. Calcified and noncalcified atheromatous disease in the proximal right ICA on resultant multi focal irregular stenosis of up to 30% by NASCET criteria. Right ICA otherwise well opacified to the skullbase.  Prominent atheromatous plaque present at the origin of the right external carotid artery without hemodynamically significant stenosis.  Left carotid system: Approximately 50% stenosis at the origin of the left common carotid artery. Moderate plaque within the mid and distal left common carotid artery with resultant fairly long segment stenosis above 50-60%. Prominent plaque present about the carotid bifurcation. Atheromatous plaque at the origin of the left ICA results in mild narrowing of approximately 30% by NASCET criteria. Left ICA well opacified to the skullbase distally.  Left external carotid arteries and its branches within normal limits.  Vertebral arteries: Right mild calcified atheromatous disease present within the proximal right vertebral artery without significant stenosis. Right vertebral artery well opacified to the skullbase.  There is scattered calcified plaque within name mid left vertebral artery. There is a short-segment stenosis of approximately 50% at the level of C4. Additional plaque present more proximally at the level of C6-7 with resultant mild stenosis. Atheromatous disease present of the origin of left vertebral artery.  No evidence for vertebral artery dissection.  No soft tissue mild centrilobular emphysematous changes within the visualized upper lobes.  Reversal of the normal cervical  lordosis with multilevel degenerative disc disease. No acute osseous abnormality. No worrisome lytic or blastic osseous lesions.  CTA HEAD  Anterior circulation: The petrous segments of the internal carotid arteries well opacified bilaterally. Small 3 mm focal outpouching extending from the proximal aspect of the cavernous right ICA grossly stable (series 404, image 115). This aneurysm projects anteriorly and inferiorly. Calcified plaque present within the cavernous carotid arteries without significant stenosis. Supra clinoid segments widely patent. A1 segments, anterior communicating artery common anterior cerebral arteries well opacified.  M1 segments widely patent bilaterally. There is mild narrowing of the distal left M1 segment, similar to prior. Mild distal branch irregularity present within the distal MCA branches. No proximal branch occlusion.  Left vertebral artery well opacified to the vertebrobasilar junction. Short-segment stenosis of approximately 50% within the distal right vertebral artery just prior to the vertebrobasilar junction. Posterior inferior cerebral arteries patent bilaterally. Basilar artery widely patent. Superior cerebellar arteries patent. Moderate multi focal atherosclerotic irregularity present within the P2 segments bilaterally, left greater than right.  Venous sinuses: No acute abnormality within the venous sinuses.  Delayed phase: No abnormal enhancement on delayed sequence.  IMPRESSION: CTA NECK IMPRESSION:  1. No acute abnormality within the major articular vasculature of the neck. No evidence for vertebral artery dissection. 2. Moderate atheromatous plaque in the aortic arch and at the origin of the great vessels with associated 50% stenosis at the origin of the left common carotid artery. 3. Atheromatous plaque within the mid and distal  left common carotid artery with associated fairly long segment stenosis of up to 50%. 4. Calcified plaque at the origin of the internal carotid  arteries with associated stenoses of approximately 30% bilaterally. 5. Multi focal plaque within the left vertebral artery with associated stenosis of approximately 50% at the level of C4.  CTA HEAD IMPRESSION:  1. No proximal branch occlusion or hemodynamically significant stenosis within the intracranial circulation. 2. Stable focal stenosis of approximately 50% within the distal right vertebral artery just prior to the vertebrobasilar junction. 3. Similar multi focal atherosclerotic irregularity within the distal MCA and PCA branches bilaterally. 4. 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 This is directed anteriorly and inferiorly.   Electronically Signed   By: Jeannine Boga M.D.   On: 10/05/2014 07:47   Mr Jeri Cos EH Contrast  10/05/2014   CLINICAL DATA:  Initial evaluation for abnormal posterior head sensation. Evaluate for possible acute stroke.  EXAM: MRI HEAD WITHOUT AND WITH CONTRAST  TECHNIQUE: Multiplanar, multiecho pulse sequences of the brain and surrounding structures were obtained without and with intravenous contrast.  CONTRAST:  16m MULTIHANCE GADOBENATE DIMEGLUMINE 529 MG/ML IV SOLN  COMPARISON:  Prior head CT from 10/04/2014.  FINDINGS: Study is degraded by motion artifact.  Diffuse prominence of the CSF containing spaces is compatible with generalized cerebral atrophy. Scattered patchy T2/FLAIR hyperintensity within the periventricular and deep white matter both cerebral hemispheres noted, most consistent with chronic small vessel ischemic disease, grossly similar relative to 2010. Small vessel changes present within the pons as well.  Remote right cerebellar infarcts are present. Previously described new hypodense region within the right cerebellar hemisphere is most consistent with an additional chronic ischemic infarct.  No abnormal foci of restricted diffusion to suggest acute intracranial infarct. There is no intracranial hemorrhage. Gray-white matter  differentiation maintained.  No mass lesion or midline shift. No hydrocephalus. No extra-axial fluid collection. No definite abnormal enhancement.  Craniocervical junction within normal limits. Pituitary gland normal.  No acute abnormality seen about the orbits.  Paranasal sinuses and mastoid air cells are clear.  Bone marrow signal intensity normal. Minimal degenerative changes noted within the upper cervical spine. Calvarium intact. Scalp soft tissues normal.  IMPRESSION: 1. No acute intracranial infarct or other abnormality identified. 2. Remote cerebellar infarcts. One of these infarcts is new relative to most recent MRI from 2010, but is chronic in appearance. 3. Atrophy with mild chronic small vessel ischemic disease.   Electronically Signed   By: BJeannine BogaM.D.   On: 10/05/2014 08:03     EKG Interpretation   Date/Time:  Friday October 04 2014 16:50:51 EST Ventricular Rate:  69 PR Interval:  147 QRS Duration: 95 QT Interval:  418 QTC Calculation: 448 R Axis:   20 Text Interpretation:  Sinus rhythm Ventricular premature complex Probable  left atrial enlargement Low voltage, precordial leads No significant  change since last tracing Confirmed by [facility], [phone] on  10/04/2014 8:37:40 PM      MDM   Final diagnoses:  Headache  Cerebral infarction due to unspecified mechanism    Pt presenting with c/o feeling off balance, headache and weakness.  CT scan shows right cerebellar infarct of uncertain age.  D/w neurology as above and patient transferred to [facility] to the hospitalist service for further workup and management.  Nursing notes including past medical history and social history reviewed and considered in documentation.       MThreasa Beards MD  10/05/14 1723

## 2014-10-04 NOTE — ED Notes (Signed)
Pt alert, NAD, calm, interactive, resps e/u, speaking in clear complete sentences,VSS, (denies: pain, sob, nausea, weakness, dizziness, HA or other sx), speech clear, daughter at [facility].

## 2014-10-05 ENCOUNTER — Inpatient Hospital Stay (HOSPITAL_COMMUNITY): Payer: Medicare Other

## 2014-10-05 ENCOUNTER — Encounter (HOSPITAL_COMMUNITY): Payer: Self-pay | Admitting: *Deleted

## 2014-10-05 DIAGNOSIS — E785 Hyperlipidemia, unspecified: Secondary | ICD-10-CM

## 2014-10-05 DIAGNOSIS — E1169 Type 2 diabetes mellitus with other specified complication: Secondary | ICD-10-CM

## 2014-10-05 DIAGNOSIS — I369 Nonrheumatic tricuspid valve disorder, unspecified: Secondary | ICD-10-CM

## 2014-10-05 DIAGNOSIS — G459 Transient cerebral ischemic attack, unspecified: Secondary | ICD-10-CM

## 2014-10-05 LAB — GLUCOSE, CAPILLARY
Glucose-Capillary: 213 mg/dL — ABNORMAL HIGH (ref 70–99)
Glucose-Capillary: 203 mg/dL — ABNORMAL HIGH (ref 70–99)
Glucose-Capillary: 207 mg/dL — ABNORMAL HIGH (ref 70–99)

## 2014-10-05 LAB — LIPID PANEL
Cholesterol: 139 mg/dL (ref 0–200)
HDL: 26 mg/dL — AB (ref >39)
LDL Cholesterol: 49 mg/dL (ref 0–99)
TRIGLYCERIDES: 321 mg/dL — AB (ref <150)
Total CHOL/HDL Ratio: 5.3 RATIO
VLDL: 64 mg/dL — ABNORMAL HIGH (ref 0–40)

## 2014-10-05 LAB — HEMOGLOBIN A1C
HEMOGLOBIN A1C: 8.7 % — AB (ref <5.7)
MEAN PLASMA GLUCOSE: 203 mg/dL — AB (ref <117)

## 2014-10-05 LAB — TROPONIN I: Troponin I: <0.03 ng/mL (ref <0.031)

## 2014-10-05 MED ORDER — IOHEXOL 350 MG/ML SOLN
50.0000 mL | Freq: Once | INTRAVENOUS | Status: AC | PRN
  Administered 2014-10-05: 50 mL via INTRAVENOUS

## 2014-10-05 MED ORDER — UNABLE TO FIND: Status: DC

## 2014-10-05 MED ORDER — GADOBENATE DIMEGLUMINE 529 MG/ML IV SOLN
10.0000 mL | Freq: Once | INTRAVENOUS | Status: AC | PRN
  Administered 2014-10-05: 10 mL via INTRAVENOUS

## 2014-10-05 NOTE — Progress Notes (Signed)
STROKE TEAM PROGRESS NOTE   HISTORY [name] is a 69 y.o. female with a history of previous stroke who is followed by Dr. Riley Nearing for a brain aneurysm who presented to the emergency room due to an abnormal head sensation, headaches, chronic imbalance, left subconjunctival hemorrhage. CT head in the emergency room showed a likely subacute to chronic infarct that was new since previous imaging.  After discussing symptoms with her, it is not clear when this occurred.  She has complained of symptoms of an abnormal sensation in the back of her head similar to a vibration which is been going on for several days. She has also been having increased number of headaches over the past month.   LKW: Unclear tpa given?: no, outside of window   SUBJECTIVE (INTERVAL HISTORY) The patient states she is feeling much better but still has a mildly odd sensation in her head and neck. She is anxious to go home. We discussed smoking cessation. Briefly discussed lung nodule on chest x-ray. Patient states she was evaluated for a small nodule in the past which was benign. Possibly the same nodule.   OBJECTIVE Temp:  [98.2 F (36.8 C)-98.4 F (36.9 C)] 98.2 F (36.8 C) (01/16 0615) Pulse Rate:  [60-80] 62 (01/16 0615) Cardiac Rhythm:  [-] Normal sinus rhythm (01/15 2150) Resp:  [14-21] 16 (01/16 0615) BP: (120-164)/(51-80) 145/58 mmHg (01/16 0615) SpO2:  [93 %-95 %] 95 % (01/16 0615) Weight:  [144 lb 13.5 oz (65.7 kg)] 144 lb 13.5 oz (65.7 kg) (01/15 2359)   Recent Labs Lab 10/04/14 2249 10/05/14 0239 10/05/14 0539  GLUCAP 121* 213* 203*    Recent Labs Lab 10/04/14 1545 10/04/14 1556  NA 138 139  K 3.7 3.6  CL 98 100  CO2 31  --   GLUCOSE 186* 187*  BUN 19 20  CREATININE 1.17* 1.20*  CALCIUM 9.3  --     Recent Labs Lab 10/04/14 1545  AST 27  ALT 17  ALKPHOS 80  BILITOT 0.7  PROT 7.6  ALBUMIN 3.9    Recent Labs Lab 10/04/14 1545 10/04/14 1556  WBC 10.7*  --   NEUTROABS  6.7  --   HGB 15.1* 16.0*  HCT 45.6 47.0*  MCV 95.2  --   PLT 327  --     Recent Labs Lab 10/04/14 2337  TROPONINI <0.03    Recent Labs  10/04/14 1545  LABPROT 12.1  INR 0.89    Recent Labs  10/04/14 1807  COLORURINE YELLOW  LABSPEC 1.023  PHURINE 6.0  GLUCOSEU >1000*  HGBUR NEGATIVE  BILIRUBINUR NEGATIVE  KETONESUR NEGATIVE  PROTEINUR NEGATIVE  UROBILINOGEN 0.2  NITRITE NEGATIVE  LEUKOCYTESUR NEGATIVE       Component Value Date/Time   CHOL 139 10/05/2014 0535   TRIG 321* 10/05/2014 0535   HDL 26* 10/05/2014 0535   CHOLHDL 5.3 10/05/2014 0535   VLDL 64* 10/05/2014 0535   LDLCALC 49 10/05/2014 0535   Lab Results  Component Value Date   HGBA1C 9.3* 09/02/2014   No results found for: LABOPIA, COCAINSCRNUR, LABBENZ, AMPHETMU, THCU, LABBARB   Recent Labs Lab 10/04/14 1545  ETH <5    Ct Angio Head W/cm &/or Wo Cm 10/05/2014    CTA NECK  1. No acute abnormality within the major articular vasculature of the neck. No evidence for vertebral artery dissection.  2. Moderate atheromatous plaque in the aortic arch and at the origin of the great vessels with associated 50% stenosis at the origin  of the left common carotid artery.  3. Atheromatous plaque within the mid and distal left common carotid artery with associated fairly long segment stenosis of up to 50%.  4. Calcified plaque at the origin of the internal carotid arteries with associated stenoses of approximately 30% bilaterally.  5. Multi focal plaque within the left vertebral artery with associated stenosis of approximately 50% at the level of C4.    CTA HEAD  1. No proximal branch occlusion or hemodynamically significant stenosis within the intracranial circulation.  2. Stable focal stenosis of approximately 50% within the distal right vertebral artery just prior to the vertebrobasilar junction.  3. Similar multi focal atherosclerotic irregularity within the distal MCA and PCA branches bilaterally.  4.  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 This is directed anteriorly and inferiorly.       Dg Chest 2 View 10/04/2014    Nonspecific 5 mm nodule at the left midlung zone. Lungs otherwise clear. This could simply reflect a bone island within the overlying rib; CT of the chest could be considered on an elective nonemergent basis.       Ct Head Wo Contrast 10/04/2014    New wedge-shaped low density is noted peripherally in the right cerebellar hemisphere most consistent with infarction of indeterminate age. MRI is recommended for further evaluation.      MR of the brain with and without contrast 10/05/2014 1. No acute intracranial infarct or other abnormality identified. 2. Remote cerebellar infarcts. One of these infarcts is new relative to most recent MRI from 2010, but is chronic in appearance. 3. Atrophy with mild chronic small vessel ischemic disease.     PHYSICAL EXAM Neurologic Examination:  General - pleasant articulate 69 year old female in no acute distress. Left eye is injected and draining. Mental Status:  Alert, oriented, thought content appropriate. Speech without evidence of dysarthria or aphasia. Able to follow 3 step commands without difficulty.  Cranial Nerves:  II-bilateral visual fields intact III/IV/VI-Pupils were equal and reacted. Extraocular movements were full.  V/VII-no facial numbness and no facial weakness.  VIII-normal.  X-normal speech and symmetrical palatal movement.  XII-midline tongue extension  Motor: 5/5 strength symmetrical throughout.  Muscle tone normal throughout. Sensory: Intact to light touch in all extremities. Deep Tendon Reflexes: 2/4 throughout Plantars: Downgoing bilaterally  Cerebellar: Normal finger to nose and heel to shin bilaterally.    ASSESSMENT/PLAN Ms. [name] is a 69 y.o. female with history of diabetes mellitus, ongoing tobacco use, migraine headaches, hyperlipidemia, hypertension,  previous stroke, history of right brain aneurysm, and peripheral vascular disease  presenting with headaches, chronic imbalance, left sub-conjunctival hemorrhage and an abnormal head sensation. She did not receive IV t-PA due to late presentation outside the window for therapeutic treatment.  No acute infarct by MRI.  Resultant - improvement in symptoms.  MRI  No acute intracranial infarct or other abnormality identified. Remote cerebellar infarcts.  MRA  not performed  CTA head & neck - no high-grade stenosis. 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  Carotid Doppler - Will cancel - please refer to CTA of neck.  2D Echo - pending  LDL- 49  HgbA1c pending ( 9.3 in December )  SCDs for VTE prophylaxis  Diet heart healthy/carb modified with thin liquids  aspirin 325 mg orally every day prior to admission, now on aspirin 325 mg orally every day  Patient counseled to be compliant with her antithrombotic medications  Ongoing aggressive  stroke risk factor management  Therapy recommendations:  Pending  Disposition:  Pending  Hypertension  Home meds:  Losartan/hydrochlorothiazide  Stable   Hyperlipidemia  Home meds:  Zocor 40 mg daily resumed in hospital  LDL 49, goal < 70  Continue statin at discharge  Diabetes  HgbA1c pending goal < 7.0  Uncontrolled  Other Stroke Risk Factors  Advanced age  Cigarette smoker, advised to stop smoking  Hx stroke/TIA  Migraines   Other Active Problems  Renal insufficiency  Lung nodule - will need follow-up  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 Catalina Gravel  Plan  Await hemoglobin A1c  Await 2-D echo  Await therapy evaluations  Outpatient follow-up lung nodule per Dr [facility] day # 1   The patient is doing quite well today. Symptoms of fatigue for several days prior to coming in the hospital, I'm not sure that the clinical  syndrome represented a TIA or stroke. MRI this did not show any new events, the right cerebellar stroke events appear to be quite old, cystic. The patient likely could be discharged today following 2-D echocardiogram. Would keep the patient on aspirin, the patient indicates that she cannot take Plavix.  Mikey Bussing PA-C [facility] Pager [phone] 10/05/2014, 7:54 AM   Lenor Coffin [phone]  To contact Stroke Continuity provider, please refer to [email or web address] After hours, contact General Neurology

## 2014-10-05 NOTE — Progress Notes (Signed)
Discharge orders received, pt for discharge home today. IV and telemetry D/C,  D/C instructions and Rx given with verbalized understanding.  Family at bedside to assist pt with discharge. Staff brought pt downstairs via wheelchair.

## 2014-10-05 NOTE — Progress Notes (Signed)
SLP Cancellation Note  Patient Details Name: [name] MRN: [id number] DOB: [birth year]   Cancelled treatment:       Reason Eval/Treat Not Completed: SLP screened, no needs identified, will sign off. Pt appears to be at her cognitive-linguistic baseline and denies any changes. Per MRI, cerebellar infarct appears chronic. No acute SLP needs.    Germain Osgood, M.A. CCC-SLP [phone]  Germain Osgood 10/05/2014, 9:57 AM

## 2014-10-05 NOTE — Discharge Summary (Signed)
Physician Discharge Summary  [name] KDT:[id number] DOB: August 17, 1946 DOA: 10/04/2014  PCP: Alesia Richards, MD  Admit date: 10/04/2014 Discharge date: 10/05/2014  Time spent: 45 minutes  Recommendations for Outpatient Follow-up:  1. Follow up with Dr. [name] in 1-2 weeks, consider outpatient nonemergent CT scan of the chest due to the 5 mm nodule in the left midlung zone as per the chest x-ray 2. Follow up with Neurology in 1 month   Discharge Diagnoses:  Active Problems:   CKD stage 3 due to type 2 diabetes mellitus   Hyperlipidemia associated with type 2 diabetes mellitus   Glaucoma   Essential hypertension   CEREBRAL ANEURYSM   CVA (cerebral infarction)   Tobacco abuse   Cerebellar infarction  Discharge Condition: stable  Diet recommendation: heart healthy  Filed Weights   10/04/14 2359  Weight: 65.7 kg (144 lb 13.5 oz)    History of present illness:  [name] is a 69 y.o. female  has a past medical history of Phlebitis; Anxiety; GERD (gastroesophageal reflux disease); Migraines; Diabetes mellitus (1998); Hyperlipidemia; Hypertension (2000); Stroke; Aneurysm (2013); Status post colonoscopy (07/2010); Abnormal findings on esophagogastroduodenoscopy (EGD) (07/2010); and Peripheral vascular disease.   Presented with For the past 2-3 days she have had some uncomfortable sensation in the back of her neck and top of her head. She reports fatigue for a few days. She has been unbalanced for the past few weeks. Yesterday she noted that she had a hemorrhage to the conjunctiva of Left eye and that worried her to seek medical attention. Patient has hx of CVA in the past but could not tolerate plavix due to palpitations. She has known hx of cerebral aneurism diagnosed in 2008 but due to small size it has been managed conservatively. She has noted worsening of her migraine headaches. She presented to [facility] ER and CT head showed New wedge-shaped low density is noted  peripherally in the right cerebellar hemisphere most consistent with infarction of indeterminate age. Neurology has been consulted and recomended transfer to [facility] for CVA work up. [name] has been ordered. Patient has history of claustrophobia. We will attempt to treat with Xanax prior to MRI if cannot tolerate patient will need open MRI.  Hospitalist was called for admission for Cerebellar CVA  Hospital Course:  Patient was admitted to the neurology floor with concern for CVA. Neurology was consulted and have followed patient while hospitalized. She underwent an MRI of the brain which was negative for an acute intracranial infarct or other abnormalities. She underwent TIA workup with a CT angio (see below), as well as a 2-D echo which showed normal ejection fraction of 60-65% without any wall motion abnormalities, and grade 1 diastolic dysfunction. Patient's symptoms have completely resolved  and she was feeling back to baseline, with a workup completed she was stable and discharged home in stable condition, she will need close outpatient follow-up with her primary care provider as well as neurology.  Procedures:  2D echo   Consultations:  Neurology   Discharge Exam: Filed Vitals:   10/05/14 0615 10/05/14 0814 10/05/14 0912 10/05/14 1340  BP: 145/58 140/53 140/56 133/49  Pulse: 62 61 67 69  Temp: 98.2 F (36.8 C) 97.9 F (36.6 C) 98.1 F (36.7 C) 98.6 F (37 C)  TempSrc: Oral Oral Oral Oral  Resp: _0 Height:      Weight:      SpO2: 95% 96% 98% 96%    General: NAD Cardiovascular: RRR  Respiratory: CTA biL  Discharge Instructions     Medication List    TAKE these medications        ACCU-CHEK ACTIVE CARE KIT Kit  1 Device by Does not apply route 3 (three) times daily.     aspirin 325 MG tablet  Take 325 mg by mouth daily.     canagliflozin 300 MG Tabs tablet  Commonly known as:  INVOKANA  Take 1/2 tyo 1 tablet daily as directed for Diabetes     CINNAMON PO    Take 1,000 mg by mouth 2 (two) times daily.     citalopram 20 MG tablet  Commonly known as:  CELEXA  Take 20 mg by mouth daily.     Fish Oil 1200 MG Caps  Take 1 capsule by mouth daily.     glucose blood test strip  - 1 each by Other route 3 (three) times daily. Dispense 3 month supply  - Accu Check testing strips  - 3 boxes of 100     insulin aspart protamine - aspart (70-30) 100 UNIT/ML FlexPen  Commonly known as:  NOVOLOG 70/30 MIX  Take up to 15 units  2 x daily by sliding scale as directed for Diabetes     IRON PO  Take by mouth daily.     losartan-hydrochlorothiazide 100-25 MG per tablet  Commonly known as:  HYZAAR  TAKE 1 TABLET BY MOUTH EVERY DAY FOR BLOOD PRESSURE     metFORMIN 500 MG 24 hr tablet  Commonly known as:  GLUCOPHAGE-XR  TAKE 2 TABLETS BY MOUTH TWICE A DAY     NOVOFINE 32G X 6 MM Misc  Generic drug:  Insulin Pen Needle  USE AS DIRECTED 4 TIMES A DAY     omeprazole 40 MG capsule  Commonly known as:  PRILOSEC  TAKE ONE CAPSULE BY MOUTH EVERY DAY     simvastatin 40 MG tablet  Commonly known as:  ZOCOR  TAKE 1 TABLET BY MOUTH AT BEDTIME     sitaGLIPtin 100 MG tablet  Commonly known as:  JANUVIA  Take 1/2 to 1 tablet daily as directed for Diabetes     UNABLE TO FIND  Outpatient OT     Vitamin D3 5000 UNITS Tabs  Take 1 capsule by mouth daily.     vitamin E 400 UNIT capsule  Generic drug:  vitamin E  Take 400 Units by mouth daily.     XALATAN 0.005 % ophthalmic solution  Generic drug:  latanoprost  Place 1 drop into both eyes at bedtime.     zinc gluconate 50 MG tablet  Take 50 mg by mouth daily.           Follow-up Information    Follow up with MCKEOWN,WILLIAM DAVID, MD. Schedule an appointment as soon as possible for a visit in 2 weeks.   Specialty:  Internal Medicine   Contact information:   [address] [place] [address] [phone]       The results of significant diagnostics from this  hospitalization (including imaging, microbiology, ancillary and laboratory) are listed below for reference.    Significant Diagnostic Studies: Ct Angio Head W/cm &/or Wo Cm  10/05/2014   CLINICAL DATA:  Initial evaluation for abnormal head sensation, headaches, chronic imbalance. New subacute to chronic infarct seen on prior CT.  EXAM: CT ANGIOGRAPHY HEAD AND NECK  TECHNIQUE: Multidetector CT imaging of the head and neck was performed using the standard protocol during bolus administration of  intravenous contrast. Multiplanar CT image reconstructions and MIPs were obtained to evaluate the vascular anatomy. Carotid stenosis measurements (when applicable) are obtained utilizing NASCET criteria, using the distal internal carotid diameter as the denominator.  CONTRAST:  53m OMNIPAQUE IOHEXOL 350 MG/ML SOLN  COMPARISON:  Prior CT from 10/04/2014 as well as previous MRI from 12/25/2012  FINDINGS: CTA NECK  Aortic arch: Visualized aortic arch is of normal caliber with normal 3 vessel morphology. Moderate atheromatous plaque present within the aortic arch and at the origin of the great vessels. Associated stenosis at the proximal left common carotid artery estimated to be in 50% by NASCET criteria. There is mild stenosis of the proximal left subclavian artery of approximately 20%.  Subclavian arteries widely patent.  Right carotid system: Right common carotid artery well opacified to the bifurcation. There is moderate atheromatous plaque about the right carotid bifurcation. Calcified and noncalcified atheromatous disease in the proximal right ICA on resultant multi focal irregular stenosis of up to 30% by NASCET criteria. Right ICA otherwise well opacified to the skullbase.  Prominent atheromatous plaque present at the origin of the right external carotid artery without hemodynamically significant stenosis.  Left carotid system: Approximately 50% stenosis at the origin of the left common carotid artery. Moderate plaque  within the mid and distal left common carotid artery with resultant fairly long segment stenosis above 50-60%. Prominent plaque present about the carotid bifurcation. Atheromatous plaque at the origin of the left ICA results in mild narrowing of approximately 30% by NASCET criteria. Left ICA well opacified to the skullbase distally.  Left external carotid arteries and its branches within normal limits.  Vertebral arteries: Right mild calcified atheromatous disease present within the proximal right vertebral artery without significant stenosis. Right vertebral artery well opacified to the skullbase.  There is scattered calcified plaque within name mid left vertebral artery. There is a short-segment stenosis of approximately 50% at the level of C4. Additional plaque present more proximally at the level of C6-7 with resultant mild stenosis. Atheromatous disease present of the origin of left vertebral artery.  No evidence for vertebral artery dissection.  No soft tissue mild centrilobular emphysematous changes within the visualized upper lobes.  Reversal of the normal cervical lordosis with multilevel degenerative disc disease. No acute osseous abnormality. No worrisome lytic or blastic osseous lesions.  CTA HEAD  Anterior circulation: The petrous segments of the internal carotid arteries well opacified bilaterally. Small 3 mm focal outpouching extending from the proximal aspect of the cavernous right ICA grossly stable (series 404, image 115). This aneurysm projects anteriorly and inferiorly. Calcified plaque present within the cavernous carotid arteries without significant stenosis. Supra clinoid segments widely patent. A1 segments, anterior communicating artery common anterior cerebral arteries well opacified.  M1 segments widely patent bilaterally. There is mild narrowing of the distal left M1 segment, similar to prior. Mild distal branch irregularity present within the distal MCA branches. No proximal branch  occlusion.  Left vertebral artery well opacified to the vertebrobasilar junction. Short-segment stenosis of approximately 50% within the distal right vertebral artery just prior to the vertebrobasilar junction. Posterior inferior cerebral arteries patent bilaterally. Basilar artery widely patent. Superior cerebellar arteries patent. Moderate multi focal atherosclerotic irregularity present within the P2 segments bilaterally, left greater than right.  Venous sinuses: No acute abnormality within the venous sinuses.  Delayed phase: No abnormal enhancement on delayed sequence.  IMPRESSION: CTA NECK IMPRESSION:  1. No acute abnormality within the major articular vasculature of the neck. No evidence for vertebral  artery dissection. 2. Moderate atheromatous plaque in the aortic arch and at the origin of the great vessels with associated 50% stenosis at the origin of the left common carotid artery. 3. Atheromatous plaque within the mid and distal left common carotid artery with associated fairly long segment stenosis of up to 50%. 4. Calcified plaque at the origin of the internal carotid arteries with associated stenoses of approximately 30% bilaterally. 5. Multi focal plaque within the left vertebral artery with associated stenosis of approximately 50% at the level of C4.  CTA HEAD IMPRESSION:  1. No proximal branch occlusion or hemodynamically significant stenosis within the intracranial circulation. 2. Stable focal stenosis of approximately 50% within the distal right vertebral artery just prior to the vertebrobasilar junction. 3. Similar multi focal atherosclerotic irregularity within the distal MCA and PCA branches bilaterally. 4. 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 This is directed anteriorly and inferiorly.   Electronically Signed   By: Jeannine Boga M.D.   On: 10/05/2014 07:47   Dg Chest 2 View  10/04/2014   CLINICAL DATA:  Status post CVA. Head and neck tingling. Initial  encounter.  EXAM: CHEST  2 VIEW  COMPARISON:  Chest radiograph performed 08/01/2012  FINDINGS: The lungs are well-aerated. A calcified granuloma is noted at the right midlung zone. There is a nonspecific 5 mm nodule at the left midlung zone. There is no evidence of pleural effusion or pneumothorax.  The heart is normal in size; the mediastinal contour is within normal limits. No acute osseous abnormalities are seen. Postoperative change is noted about the upper abdomen.  IMPRESSION: Nonspecific 5 mm nodule at the left midlung zone. Lungs otherwise clear. This could simply reflect a bone island within the overlying rib; CT of the chest could be considered on an elective nonemergent basis.   Electronically Signed   By: Garald Balding M.D.   On: 10/04/2014 22:22   Ct Head Wo Contrast  10/04/2014   CLINICAL DATA:  Headache for 2 days.  No known injury.  EXAM: CT HEAD WITHOUT CONTRAST  TECHNIQUE: Contiguous axial images were obtained from the base of the skull through the vertex without intravenous contrast.  COMPARISON:  CT scan of October 23, 2003.  FINDINGS: Bony calvarium appears intact. Minimal diffuse cortical atrophy is noted. No mass effect or midline shift is noted. Ventricular size is within normal limits. There is no evidence of mass lesion or hemorrhage. New wedge-shaped low density is noted peripherally in the right cerebellar hemisphere most consistent with infarction of indeterminate age.  IMPRESSION: New wedge-shaped low density is noted peripherally in the right cerebellar hemisphere most consistent with infarction of indeterminate age. MRI is recommended for further evaluation.   Electronically Signed   By: Sabino Dick M.D.   On: 10/04/2014 16:30   Ct Angio Neck W/cm &/or Wo/cm  10/05/2014   CLINICAL DATA:  Initial evaluation for abnormal head sensation, headaches, chronic imbalance. New subacute to chronic infarct seen on prior CT.  EXAM: CT ANGIOGRAPHY HEAD AND NECK  TECHNIQUE: Multidetector CT  imaging of the head and neck was performed using the standard protocol during bolus administration of intravenous contrast. Multiplanar CT image reconstructions and MIPs were obtained to evaluate the vascular anatomy. Carotid stenosis measurements (when applicable) are obtained utilizing NASCET criteria, using the distal internal carotid diameter as the denominator.  CONTRAST:  72m OMNIPAQUE IOHEXOL 350 MG/ML SOLN  COMPARISON:  Prior CT from 10/04/2014 as well as previous MRI from 12/25/2012  FINDINGS: CTA NECK  Aortic arch: Visualized aortic arch is of normal caliber with normal 3 vessel morphology. Moderate atheromatous plaque present within the aortic arch and at the origin of the great vessels. Associated stenosis at the proximal left common carotid artery estimated to be in 50% by NASCET criteria. There is mild stenosis of the proximal left subclavian artery of approximately 20%.  Subclavian arteries widely patent.  Right carotid system: Right common carotid artery well opacified to the bifurcation. There is moderate atheromatous plaque about the right carotid bifurcation. Calcified and noncalcified atheromatous disease in the proximal right ICA on resultant multi focal irregular stenosis of up to 30% by NASCET criteria. Right ICA otherwise well opacified to the skullbase.  Prominent atheromatous plaque present at the origin of the right external carotid artery without hemodynamically significant stenosis.  Left carotid system: Approximately 50% stenosis at the origin of the left common carotid artery. Moderate plaque within the mid and distal left common carotid artery with resultant fairly long segment stenosis above 50-60%. Prominent plaque present about the carotid bifurcation. Atheromatous plaque at the origin of the left ICA results in mild narrowing of approximately 30% by NASCET criteria. Left ICA well opacified to the skullbase distally.  Left external carotid arteries and its branches within normal  limits.  Vertebral arteries: Right mild calcified atheromatous disease present within the proximal right vertebral artery without significant stenosis. Right vertebral artery well opacified to the skullbase.  There is scattered calcified plaque within name mid left vertebral artery. There is a short-segment stenosis of approximately 50% at the level of C4. Additional plaque present more proximally at the level of C6-7 with resultant mild stenosis. Atheromatous disease present of the origin of left vertebral artery.  No evidence for vertebral artery dissection.  No soft tissue mild centrilobular emphysematous changes within the visualized upper lobes.  Reversal of the normal cervical lordosis with multilevel degenerative disc disease. No acute osseous abnormality. No worrisome lytic or blastic osseous lesions.  CTA HEAD  Anterior circulation: The petrous segments of the internal carotid arteries well opacified bilaterally. Small 3 mm focal outpouching extending from the proximal aspect of the cavernous right ICA grossly stable (series 404, image 115). This aneurysm projects anteriorly and inferiorly. Calcified plaque present within the cavernous carotid arteries without significant stenosis. Supra clinoid segments widely patent. A1 segments, anterior communicating artery common anterior cerebral arteries well opacified.  M1 segments widely patent bilaterally. There is mild narrowing of the distal left M1 segment, similar to prior. Mild distal branch irregularity present within the distal MCA branches. No proximal branch occlusion.  Left vertebral artery well opacified to the vertebrobasilar junction. Short-segment stenosis of approximately 50% within the distal right vertebral artery just prior to the vertebrobasilar junction. Posterior inferior cerebral arteries patent bilaterally. Basilar artery widely patent. Superior cerebellar arteries patent. Moderate multi focal atherosclerotic irregularity present within the  P2 segments bilaterally, left greater than right.  Venous sinuses: No acute abnormality within the venous sinuses.  Delayed phase: No abnormal enhancement on delayed sequence.  IMPRESSION: CTA NECK IMPRESSION:  1. No acute abnormality within the major articular vasculature of the neck. No evidence for vertebral artery dissection. 2. Moderate atheromatous plaque in the aortic arch and at the origin of the great vessels with associated 50% stenosis at the origin of the left common carotid artery. 3. Atheromatous plaque within the mid and distal left common carotid artery with associated fairly long segment stenosis of up to 50%. 4. Calcified plaque at the  origin of the internal carotid arteries with associated stenoses of approximately 30% bilaterally. 5. Multi focal plaque within the left vertebral artery with associated stenosis of approximately 50% at the level of C4.  CTA HEAD IMPRESSION:  1. No proximal branch occlusion or hemodynamically significant stenosis within the intracranial circulation. 2. Stable focal stenosis of approximately 50% within the distal right vertebral artery just prior to the vertebrobasilar junction. 3. Similar multi focal atherosclerotic irregularity within the distal MCA and PCA branches bilaterally. 4. 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 This is directed anteriorly and inferiorly.   Electronically Signed   By: Jeannine Boga M.D.   On: 10/05/2014 07:47   Mr Jeri Cos VQ Contrast  10/05/2014   CLINICAL DATA:  Initial evaluation for abnormal posterior head sensation. Evaluate for possible acute stroke.  EXAM: MRI HEAD WITHOUT AND WITH CONTRAST  TECHNIQUE: Multiplanar, multiecho pulse sequences of the brain and surrounding structures were obtained without and with intravenous contrast.  CONTRAST:  43m MULTIHANCE GADOBENATE DIMEGLUMINE 529 MG/ML IV SOLN  COMPARISON:  Prior head CT from 10/04/2014.  FINDINGS: Study is degraded by motion artifact.   Diffuse prominence of the CSF containing spaces is compatible with generalized cerebral atrophy. Scattered patchy T2/FLAIR hyperintensity within the periventricular and deep white matter both cerebral hemispheres noted, most consistent with chronic small vessel ischemic disease, grossly similar relative to 2010. Small vessel changes present within the pons as well.  Remote right cerebellar infarcts are present. Previously described new hypodense region within the right cerebellar hemisphere is most consistent with an additional chronic ischemic infarct.  No abnormal foci of restricted diffusion to suggest acute intracranial infarct. There is no intracranial hemorrhage. Gray-white matter differentiation maintained.  No mass lesion or midline shift. No hydrocephalus. No extra-axial fluid collection. No definite abnormal enhancement.  Craniocervical junction within normal limits. Pituitary gland normal.  No acute abnormality seen about the orbits.  Paranasal sinuses and mastoid air cells are clear.  Bone marrow signal intensity normal. Minimal degenerative changes noted within the upper cervical spine. Calvarium intact. Scalp soft tissues normal.  IMPRESSION: 1. No acute intracranial infarct or other abnormality identified. 2. Remote cerebellar infarcts. One of these infarcts is new relative to most recent MRI from 2010, but is chronic in appearance. 3. Atrophy with mild chronic small vessel ischemic disease.   Electronically Signed   By: BJeannine BogaM.D.   On: 10/05/2014 08:03   Labs: Basic Metabolic Panel:  Recent Labs Lab 10/04/14 1545 10/04/14 1556  NA 138 139  K 3.7 3.6  CL 98 100  CO2 31  --   GLUCOSE 186* 187*  BUN 19 20  CREATININE 1.17* 1.20*  CALCIUM 9.3  --    Liver Function Tests:  Recent Labs Lab 10/04/14 1545  AST 27  ALT 17  ALKPHOS 80  BILITOT 0.7  PROT 7.6  ALBUMIN 3.9   CBC:  Recent Labs Lab 10/04/14 1545 10/04/14 1556  WBC 10.7*  --   NEUTROABS 6.7  --     HGB 15.1* 16.0*  HCT 45.6 47.0*  MCV 95.2  --   PLT 327  --    Cardiac Enzymes:  Recent Labs Lab 10/04/14 2337  TROPONINI <0.03   CBG:  Recent Labs Lab 10/04/14 2249 10/05/14 0239 10/05/14 0539 10/05/14 1123  GLUCAP 121* 213* 203* 207*    Signed:  GMarzetta Board [facility] 10/05/2014, 6:06 PM

## 2014-10-05 NOTE — Progress Notes (Signed)
OT Cancellation Note  Patient Details Name: DARYL DESLAURIERS MRN: [id number] DOB: [birth year]   Cancelled Treatment:    Reason Eval/Treat Not Completed: OT screened, no needs identified, will sign off.   Villa Herb M   Cyndie Chime, OTR/L Occupational Therapist [phone] (pager)  10/05/2014, 1:20 PM

## 2014-10-05 NOTE — Consult Note (Signed)
Neurology Consultation Reason for Consult: Concern for new stroke Referring Physician: Roel Cluck, A  CC: Posterior head abnormal sensation  History is obtained from: Patient  HPI: [name] is a 69 y.o. female with a history of previous stroke who is followed by Dr. Riley Nearing for a brain aneurysm who presented to the emergency room due to an abnormal head sensation, headaches, chronic imbalance, left subconjunctival hemorrhage. CT head in the emergency room showed a likely subacute to chronic infarct that was new since previous imaging.  After discussing symptoms with her, it is not clear when this occurred.  She has complained of symptoms of an abnormal sensation in the back of her head similar to a vibration which is been going on for several days. She has also been having increased number of headaches over the past month.   LKW: Unclear tpa given?: no, outside of window    ROS: A 14 point ROS was performed and is negative except as noted in the HPI.   Past Medical History  Diagnosis Date  . Phlebitis     30 years ago  left leg  . Anxiety   . GERD (gastroesophageal reflux disease)   . Migraines   . Diabetes mellitus 1998  . Hyperlipidemia   . Hypertension 2000  . Stroke     multiple mini strokes ( brain aneurysm )  . 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 Status post colonoscopy 07/2010  . Abnormal findings on esophagogastroduodenoscopy (EGD) 07/2010  . Peripheral vascular disease     Family History: CAD  Social History: Tob: Current smoker  Exam: Current vital signs: BP 164/52 mmHg  Pulse 65  Temp(Src) 98.4 F (36.9 C) (Oral)  Resp 18  Ht 5\' 6"  (1.676 m)  Wt 65.7 kg (144 lb 13.5 oz)  BMI 23.39 kg/m2  SpO2 95% Vital signs in last 24 hours: Temp:  [98.2 F (36.8 C)-98.4 F (36.9 C)] 98.4 F (36.9 C) (01/15 2145) Pulse Rate:  [61-80] 65 (01/15 2342) Resp:  [14-21] 18 (01/15 2342) BP: (134-164)/(52-80) 164/52 mmHg (01/15 2342) SpO2:  [93 %-95 %] 95 % (01/15  2342) Weight:  [65.7 kg (144 lb 13.5 oz)] 65.7 kg (144 lb 13.5 oz) (01/15 2359)   Physical Exam  Constitutional: Appears well-developed and well-nourished.  Psych: Affect appropriate to situation Eyes: Left subconjunctival hemorrhage HENT: No OP obstrucion Head: Normocephalic.  Cardiovascular: Normal rate and regular rhythm.  Respiratory: Effort normal and breath sounds normal to anterior ascultation GI: Soft.  No distension. There is no tenderness.  Skin: WDI  Neuro: Mental Status: Patient is awake, alert, oriented to person, place, month, year, and situation. Patient is able to give a clear and coherent history. No signs of aphasia or neglect Cranial Nerves: II: Visual Fields are full. Pupils are equal, round, and reactive to light.   III,IV, VI: EOMI without ptosis or diploplia.  V: Facial sensation is symmetric to temperature VII: Facial movement is symmetric.  VIII: hearing is intact to voice X: Uvula elevates symmetrically XI: Shoulder shrug is symmetric. XII: tongue is midline without atrophy or fasciculations.  Motor: Tone is normal. Bulk is normal. 5/5 strength was present in all four extremities.  Sensory: Sensation is symmetric to light touch and temperature in the arms and legs. Deep Tendon Reflexes: 2+ and symmetric in the biceps and patellae.  Plantars: Toes are downgoing bilaterally.  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 With the abnormal subjective sensation in the back of her head on consideration could be vertebral dissection, though this is usually frank pain. Would favor getting a CT angiogram. Given that she has had a stroke at some point, it would be reasonable to do a  stroke workup for secondary prevention.  Recommendations: 1. HgbA1c, fasting lipid panel 2. MRI brain 3. Frequent neuro checks 4. Echocardiogram 5. CT angiogram head and neck 6. Prophylactic therapy-Antiplatelet med: Aspirin - dose 325mg  PO or 300mg  PR 7. Risk factor modification 8. Telemetry monitoring     Roland Rack, MD [facility] [phone]  If 7pm- 7am, please page neurology on call as listed in [facility].

## 2014-10-05 NOTE — Progress Notes (Signed)
Echocardiogram 2D Echocardiogram has been performed.  [name] 10/05/2014, 2:21 PM

## 2014-10-05 NOTE — Discharge Instructions (Signed)
Follow with Karen Richards, MD in 5-7 days  Please get a complete blood count and chemistry panel checked by your Primary MD at your next visit, and again as instructed by your Primary MD. Please get your medications reviewed and adjusted by your Primary MD.  Please request your Primary MD to go over all Hospital Tests and Procedure/Radiological results at the follow up, please get all Hospital records sent to your Prim MD by signing hospital release before you go home.  If you had Pneumonia of Lung problems at the Hospital: Please get a 2 view Chest X ray done in 6-8 weeks after hospital discharge or sooner if instructed by your Primary MD.  If you have Congestive Heart Failure: Please call your Cardiologist or Primary MD anytime you have any of the following symptoms:  1) 3 pound weight gain in 24 hours or 5 pounds in 1 week  2) shortness of breath, with or without a dry hacking cough  3) swelling in the hands, feet or stomach  4) if you have to sleep on extra pillows at night in order to breathe  Follow cardiac low salt diet and 1.5 lit/day fluid restriction.  If you have diabetes Accuchecks 4 times/day, Once in AM empty stomach and then before each meal. Log in all results and show them to your primary doctor at your next visit. If any glucose reading is under 80 or above 300 call your primary MD immediately.  If you have Seizure/Convulsions/Epilepsy: Please do not drive, operate heavy machinery, participate in activities at heights or participate in high speed sports until you have seen by Primary MD or a Neurologist and advised to do so again.  If you had Gastrointestinal Bleeding: Please ask your Primary MD to check a complete blood count within one week of discharge or at your next visit. Your endoscopic/colonoscopic biopsies that are pending at the time of discharge, will also need to followed by your Primary MD.  Get Medicines reviewed and adjusted. Please take all your  medications with you for your next visit with your Primary MD  Please request your Primary MD to go over all hospital tests and procedure/radiological results at the follow up, please ask your Primary MD to get all Hospital records sent to his/her office.  If you experience worsening of your admission symptoms, develop shortness of breath, life threatening emergency, suicidal or homicidal thoughts you must seek medical attention immediately by calling 911 or calling your MD immediately  if symptoms less severe.  You must read complete instructions/literature along with all the possible adverse reactions/side effects for all the Medicines you take and that have been prescribed to you. Take any new Medicines after you have completely understood and accpet all the possible adverse reactions/side effects.   Do not drive or operate heavy machinery when taking Pain medications.   Do not take more than prescribed Pain, Sleep and Anxiety Medications  Special Instructions: If you have smoked or chewed Tobacco  in the last 2 yrs please stop smoking, stop any regular Alcohol  and or any Recreational drug use.  Wear Seat belts while driving.  Please note You were cared for by a hospitalist during your hospital stay. If you have any questions about your discharge medications or the care you received while you were in the hospital after you are discharged, you can call the unit and asked to speak with the hospitalist on call if the hospitalist that took care of you is not available. Once  you are discharged, your primary care physician will handle any further medical issues. Please note that NO REFILLS for any discharge medications will be authorized once you are discharged, as it is imperative that you return to your primary care physician (or establish a relationship with a primary care physician if you do not have one) for your aftercare needs so that they can reassess your need for medications and monitor your  lab values.  You can reach the hospitalist office at phone [phone] or fax [phone]   If you do not have a primary care physician, you can call [phone] for a physician referral.  Activity: As tolerated with Full fall precautions use walker/cane & assistance as needed  Diet: heart healthy  Disposition Home

## 2014-10-05 NOTE — Evaluation (Signed)
Physical Therapy Evaluation/ Discharge Patient Details Name: [name] MRN: [id number] DOB: 1946-04-23 Today's Date: 10/05/2014   History of Present Illness  [name] is a 69 y.o. female with a history of previous stroke who is followed by Dr. Riley Nearing for a brain aneurysm who presented to the emergency room due to an abnormal head sensation, headaches, chronic imbalance, left subconjunctival hemorrhage. CT head in the emergency room showed a likely subacute to chronic infarct that was new since previous imaging.  Clinical Impression  Pt moving very well and reports no deficits from baseline. Pt with numbness in toes baseline due to neuropathy. Pt with slight balance deficit but reports nothing she is concerned with or wishes to seek therapy for. Pt with periodic vertigo but has never seen OPPT for it. Recommend OPPT for next vertigo flair. No further acute needs currently pt with 5/5 all extremities and no assist for mobility. Pt aware and agreeable to no further needs.     Follow Up Recommendations Outpatient PT (as needed for vestibular assessment of periodic vertigo)    Equipment Recommendations  None recommended by PT    Recommendations for Other Services       Precautions / Restrictions Precautions Precautions: None      Mobility  Bed Mobility Overal bed mobility: Modified Independent                Transfers Overall transfer level: Independent                  Ambulation/Gait Ambulation/Gait assistance: Modified independent (Device/Increase time) Ambulation Distance (Feet): 400 Feet Assistive device: None Gait Pattern/deviations: WFL(Within Functional Limits)   Gait velocity interpretation: at or above normal speed for age/gender General Gait Details: Pt with ability to maintain balance with head turns vertically but slight LOB with horizontal head turns able to recover on her own. Pt able to change speeds without difficulty and with head straight  no difficulty with gait  Stairs Stairs: Yes Stairs assistance: Modified independent (Device/Increase time) Stair Management: One rail Left;Alternating pattern;Forwards Number of Stairs: 4    Wheelchair Mobility    Modified Rankin (Stroke Patients Only) Modified Rankin (Stroke Patients Only) Pre-Morbid Rankin Score: No symptoms Modified Rankin: No symptoms     Balance Overall balance assessment: Needs assistance   Sitting balance-Leahy Scale: Normal       Standing balance-Leahy Scale: Good                               Pertinent Vitals/Pain Pain Assessment: No/denies pain    Home Living Family/patient expects to be discharged to:: Private residence Living Arrangements: Spouse/significant other Available Help at Discharge: Family;Available PRN/intermittently Type of Home: House Home Access: Stairs to enter Entrance Stairs-Rails: Right Entrance Stairs-Number of Steps: 4 Home Layout: One level Home Equipment: None      Prior Function Level of Independence: Independent               Hand Dominance        Extremity/Trunk Assessment   Upper Extremity Assessment: Overall WFL for tasks assessed           Lower Extremity Assessment: Overall WFL for tasks assessed      Cervical / Trunk Assessment: Normal  Communication   Communication: No difficulties  Cognition Arousal/Alertness: Awake/alert Behavior During Therapy: WFL for tasks assessed/performed Overall Cognitive Status: Within Functional Limits for tasks assessed  General Comments      Exercises        Assessment/Plan    PT Assessment    PT Diagnosis Other (comment) (Pt admitted with possibly new infarct asked to evaluate for balance and function)   PT Problem List    PT Treatment Interventions     PT Goals (Current goals can be found in the Care Plan section) Acute Rehab PT Goals PT Goal Formulation: All assessment and education complete,  DC therapy    Frequency     Barriers to discharge        Co-evaluation               End of Session   Activity Tolerance: Patient tolerated treatment well Patient left: in chair;with call bell/phone within reach Nurse Communication: Mobility status         Time: [id number] PT Time Calculation (min) (ACUTE ONLY): 11 min   Charges:   PT Evaluation $Initial PT Evaluation Tier I: 1 Procedure     PT G CodesMelford Aase 10/05/2014, 10:20 AM  Elwyn Reach, [place]

## 2014-10-21 ENCOUNTER — Ambulatory Visit: Payer: Self-pay | Admitting: Physician Assistant

## 2014-10-21 ENCOUNTER — Encounter: Payer: Self-pay | Admitting: Physician Assistant

## 2014-10-21 ENCOUNTER — Ambulatory Visit (INDEPENDENT_AMBULATORY_CARE_PROVIDER_SITE_OTHER): Payer: Medicare Other | Admitting: Physician Assistant

## 2014-10-21 VITALS — BP 138/68 | HR 76 | Temp 98.2°F | Resp 16 | Ht 66.0 in | Wt 149.0 lb

## 2014-10-21 DIAGNOSIS — E1122 Type 2 diabetes mellitus with diabetic chronic kidney disease: Secondary | ICD-10-CM | POA: Diagnosis not present

## 2014-10-21 DIAGNOSIS — F411 Generalized anxiety disorder: Secondary | ICD-10-CM | POA: Diagnosis not present

## 2014-10-21 DIAGNOSIS — N183 Chronic kidney disease, stage 3 (moderate): Principal | ICD-10-CM

## 2014-10-21 DIAGNOSIS — F17200 Nicotine dependence, unspecified, uncomplicated: Secondary | ICD-10-CM

## 2014-10-21 DIAGNOSIS — R29898 Other symptoms and signs involving the musculoskeletal system: Secondary | ICD-10-CM

## 2014-10-21 DIAGNOSIS — R911 Solitary pulmonary nodule: Secondary | ICD-10-CM

## 2014-10-21 DIAGNOSIS — Z79899 Other long term (current) drug therapy: Secondary | ICD-10-CM | POA: Diagnosis not present

## 2014-10-21 DIAGNOSIS — R2689 Other abnormalities of gait and mobility: Secondary | ICD-10-CM

## 2014-10-21 LAB — CBC WITH DIFFERENTIAL/PLATELET
BASOS ABS: 0.1 10*3/uL (ref 0.0–0.1)
Basophils Relative: 1 % (ref 0–1)
Eosinophils Absolute: 0.1 10*3/uL (ref 0.0–0.7)
Eosinophils Relative: 1 % (ref 0–5)
HCT: 42.3 % (ref 36.0–46.0)
Hemoglobin: 14.3 g/dL (ref 12.0–15.0)
LYMPHS PCT: 30 % (ref 12–46)
Lymphs Abs: 3.2 10*3/uL (ref 0.7–4.0)
MCH: 31.2 pg (ref 26.0–34.0)
MCHC: 33.8 g/dL (ref 30.0–36.0)
MCV: 92.4 fL (ref 78.0–100.0)
MPV: 10.4 fL (ref 8.6–12.4)
Monocytes Absolute: 0.5 10*3/uL (ref 0.1–1.0)
Monocytes Relative: 5 % (ref 3–12)
Neutro Abs: 6.6 10*3/uL (ref 1.7–7.7)
Neutrophils Relative %: 63 % (ref 43–77)
Platelets: 325 10*3/uL (ref 150–400)
RBC: 4.58 MIL/uL (ref 3.87–5.11)
RDW: 13 % (ref 11.5–15.5)
WBC: 10.5 10*3/uL (ref 4.0–10.5)

## 2014-10-21 LAB — HEPATIC FUNCTION PANEL
ALT: 12 U/L (ref 0–35)
AST: 17 U/L (ref 0–37)
Albumin: 3.8 g/dL (ref 3.5–5.2)
Alkaline Phosphatase: 78 U/L (ref 39–117)
BILIRUBIN DIRECT: 0.1 mg/dL (ref 0.0–0.3)
BILIRUBIN INDIRECT: 0.3 mg/dL (ref 0.2–1.2)
BILIRUBIN TOTAL: 0.4 mg/dL (ref 0.2–1.2)
TOTAL PROTEIN: 7 g/dL (ref 6.0–8.3)

## 2014-10-21 LAB — BASIC METABOLIC PANEL WITH GFR
BUN: 18 mg/dL (ref 6–23)
CHLORIDE: 103 meq/L (ref 96–112)
CO2: 28 meq/L (ref 19–32)
Calcium: 9.8 mg/dL (ref 8.4–10.5)
Creat: 1.05 mg/dL (ref 0.50–1.10)
GFR, Est African American: 64 mL/min
GFR, Est Non African American: 55 mL/min — ABNORMAL LOW
GLUCOSE: 157 mg/dL — AB (ref 70–99)
Potassium: 4.3 mEq/L (ref 3.5–5.3)
Sodium: 139 mEq/L (ref 135–145)

## 2014-10-21 LAB — MAGNESIUM: Magnesium: 1.7 mg/dL (ref 1.5–2.5)

## 2014-10-21 MED ORDER — LOSARTAN POTASSIUM 100 MG PO TABS: 100.0000 mg | ORAL_TABLET | Freq: Every day | ORAL | Status: DC

## 2014-10-21 MED ORDER — CLONAZEPAM 0.5 MG PO TABS: 0.5000 mg | ORAL_TABLET | Freq: Two times a day (BID) | ORAL | Status: DC

## 2014-10-21 MED ORDER — AMLODIPINE BESYLATE 5 MG PO TABS: 5.0000 mg | ORAL_TABLET | Freq: Every day | ORAL | Status: DC

## 2014-10-21 NOTE — Progress Notes (Signed)
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  TIA- needs aggressive risk management- continue ASA 325, advised stop smoking, LDL at goal cont statin, need better control sugar, HTN control.  Lung nodule/smoking history- high risk CA- will schedule CT chest without contrast- advised stop smoking Smoking cessation-  instruction/counseling given, counseled patient on the dangers of tobacco use, advised patient to stop smoking, and reviewed strategies to maximize success, patient would like to try to quit.  Anxiety- cut celexa in half for now, will add back on Clonazepam 0.5 1/2 -1 pill BID.  Imbalance/dizziness- ? From old CVA/ASHD versus dehydration- will stop invokana/HCTZ- cont ASA, if BP increases will add on Norvasc 31m 1/2 pill. Monitor BP at home.   OVER 40 minutes of exam, counseling, chart review, referral performed   HPI 69y.o.female with history of aneurysm, HTN, chol, DM, CKD, PVD  presents for follow up from the hospital. Admit date to the hospital was 10/04/14, patient was discharged from the hospital on 10/05/14, patient was admitted for: CVA symptoms for several days including dizziness, occipital HA, imbalance, and left conjunctival hemorrhage.  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 While in the hospital a CXR chest showed 5 mm nodule left mid lung, she does have a history of tobacco use and needs a CT chest. She is to follow up with neurology in 1 month. Dr LeCatalina Graveln March 4th.  Lab Results  Component Value Date   CREATININE 1.20* 10/04/2014   BUN 20 10/04/2014   NA 139 10/04/2014   K 3.6 10/04/2014   CL 100 10/04/2014   CO2 31 10/04/2014   Lab Results  Component Value Date   CHOL 139 10/05/2014   HDL  26* 10/05/2014   LDLCALC 49 10/05/2014   LDLDIRECT 146.4 01/30/2010   TRIG 321* 10/05/2014   CHOLHDL 5.3 10/05/2014   Lab Results  Component Value Date   HGBA1C 8.7* 10/04/2014   Currently she states that she is very anxious, constant nausea, and feeling anxious. She states she has lost a lot of weight but thinks she also lost muscle. She is following with vascular for PVD, has stents but continues to have claudication with walking. She continues to feel off balance and dizziness. Her BP is BP: 138/68 mmHg    Past Medical History  Diagnosis Date  . Phlebitis     30 years ago  left leg  . Anxiety   . GERD (gastroesophageal reflux disease)   . Migraines   . Diabetes mellitus 1998  . Hyperlipidemia   . Hypertension 2000  . Stroke     multiple mini strokes ( brain aneurysm )  . 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 Status post colonoscopy 07/2010  . Abnormal findings on esophagogastroduodenoscopy (EGD) 07/2010  . Peripheral vascular disease      Allergies  Allergen Reactions  . Plavix [Clopidogrel Bisulfate] Palpitations  . Amoxicillin Itching and Swelling    FACE & EYES SWELL  . Ace Inhibitors   . Lyrica [Pregabalin]   . Penicillins Other (See Comments)    REACTION: unspecified      Current Outpatient Prescriptions on File Prior to Visit  Medication Sig Dispense Refill  . aspirin 325 MG tablet Take  325 mg by mouth daily.     . Blood Glucose Monitoring Suppl (ACCU-CHEK ACTIVE CARE KIT) KIT 1 Device by Does not apply route 3 (three) times daily. 1 each 0  . Canagliflozin (INVOKANA) 300 MG TABS Take 1/2 tyo 1 tablet daily as directed for Diabetes (Patient taking differently: Take 150 mg by mouth daily. Take 1/2 tyo 1 tablet daily as directed for Diabetes) 90 tablet 99  . Cholecalciferol (VITAMIN D3) 5000 UNITS TABS Take 1 capsule by mouth daily.    Marland Kitchen CINNAMON PO Take 1,000 mg by mouth 2 (two) times daily.     . citalopram (CELEXA) 20 MG tablet Take 20 mg by mouth daily.     Marland Kitchen  glucose blood test strip 1 each by Other route 3 (three) times daily. Dispense 3 month supply Accu Check testing strips 3 boxes of 100    . Insulin Aspart Prot & Aspart (NOVOLOG 70/30 MIX) (70-30) 100 UNIT/ML Pen Take up to 15 units  2 x daily by sliding scale as directed for Diabetes (Patient taking differently: Inject 10-12 Units into the skin 2 (two) times daily. Take 12 units in the morning and 10 units at bedtime adjust with sliding scale as directed for Diabetes) 30 mL 90  . IRON PO Take by mouth daily.    Marland Kitchen latanoprost (XALATAN) 0.005 % ophthalmic solution Place 1 drop into both eyes at bedtime.     Marland Kitchen losartan-hydrochlorothiazide (HYZAAR) 100-25 MG per tablet TAKE 1 TABLET BY MOUTH EVERY DAY FOR BLOOD PRESSURE 90 tablet 3  . metFORMIN (GLUCOPHAGE-XR) 500 MG 24 hr tablet TAKE 2 TABLETS BY MOUTH TWICE A DAY 360 tablet 4  . NOVOFINE 32G X 6 MM MISC USE AS DIRECTED 4 TIMES A DAY 4 each prn  . Omega-3 Fatty Acids (FISH OIL) 1200 MG CAPS Take 1 capsule by mouth daily.    Marland Kitchen omeprazole (PRILOSEC) 40 MG capsule TAKE ONE CAPSULE BY MOUTH EVERY DAY 90 capsule 3  . simvastatin (ZOCOR) 40 MG tablet TAKE 1 TABLET BY MOUTH AT BEDTIME 90 tablet 0  . sitaGLIPtin (JANUVIA) 100 MG tablet Take 1/2 to 1 tablet daily as directed for Diabetes (Patient taking differently: Take 100 mg by mouth daily. ) 90 tablet 99  . UNABLE TO FIND Outpatient OT 1 each 0  . vitamin E (VITAMIN E) 400 UNIT capsule Take 400 Units by mouth daily.    Marland Kitchen zinc gluconate 50 MG tablet Take 50 mg by mouth daily.     No current facility-administered medications on file prior to visit.    ROS: all negative except above.   Physical Exam: Filed Weights   10/21/14 0903  Weight: 149 lb (67.586 kg)   BP 138/68 mmHg  Pulse 76  Temp(Src) 98.2 F (36.8 C)  Resp 16  Ht _0  (1.676 m)  Wt 149 lb (67.586 kg)  BMI 24.06 kg/m2 General Appearance: Well nourished, in no apparent distress. Eyes: PERRLA, EOMs, conjunctiva no swelling or  erythema Sinuses: No Frontal/maxillary tenderness ENT/Mouth: Ext aud canals clear, TMs without erythema, bulging. No erythema, swelling, or exudate on post pharynx.  Tonsils not swollen or erythematous. Hearing normal.  Neck: Supple, thyroid normal.  Respiratory: Respiratory effort normal, BS equal bilaterally without rales, rhonchi, wheezing or stridor.  Cardio: RRR with no MRGs. Muscular atrophy legs, decreased bilateral pulses distally.  Abdomen: Soft, + BS.  Non tender, no guarding, rebound, hernias, masses. Lymphatics: Non tender without lymphadenopathy.  Musculoskeletal: Full ROM, 5/5 strength, gait imbalance Skin:  Warm, dry without rashes, lesions, ecchymosis.  Neuro: Cranial nerves intact. Normal muscle tone, no cerebellar symptoms.   Psych: Awake and oriented X 3, normal affect, Insight and Judgment appropriate.     [name], PA-C 12:44 PM [facility]

## 2014-10-21 NOTE — Patient Instructions (Addendum)
Stop the invokana, continue the [place] or medication similar Stop the Hyzaar, will send in JUST losartan without the fluid pill- call the office if you get swelling in your legs.  Will add on Norvasc 5mg  1/2 pill daily, if your BP gets above 140/90 get on 1/2, if you get on 1/2 and BP is still greater than 140/90 than go up to 1 pill, call the office if you have leg swelling.  Celexa- cut in half for the time being  Will send to PT for leg weakness/balance Will get CT lung  Diabetes is a very complicated disease...lets simplify it.  An easy way to look at it to understand the complications is if you think of the extra sugar floating in your blood stream as glass shards floating through your blood stream.    Diabetes affects your small vessels first: 1) The glass shards (sugar) scraps down the tiny blood vessels in your eyes and lead to diabetic retinopathy, the leading cause of blindness in the [place]. Diabetes is the leading cause of newly diagnosed adult (68 to 67 years of age) blindness in the [place].  2) The glass shards scratches down the tiny vessels of your legs leading to nerve damage called neuropathy and can lead to amputations of your feet. More than 60% of all non-traumatic amputations of lower limbs occur in people with diabetes.  3) Over time the small vessels in your brain are shredded and closed off, individually this does not cause any problems but over a long period of time many of the small vessels being blocked can lead to Vascular Dementia.   4) Your kidney's are a filter system and have a "net" that keeps certain things in the body and lets bad things out. Sugar shreds this net and leads to kidney damage and eventually failure. Decreasing the sugar that is destroying the net and certain blood pressure medications can help stop or decrease progression of kidney disease. Diabetes was the primary cause of kidney failure in 44 percent of all new cases in 2011.  5) Diabetes  also destroys the small vessels in your penis that lead to erectile dysfunction. Eventually the vessels are so damaged that you may not be responsive to cialis or viagra.   Diabetes and your large vessels: Your larger vessels consist of your coronary arteries in your heart and the carotid vessels to your brain. Diabetes or even increased sugars put you at 300% increased risk of heart attack and stroke and this is why.. The sugar scrapes down your large blood vessels and your body sees this as an internal injury and tries to repair itself. Just like you get a scab on your skin, your platelets will stick to the blood vessel wall trying to heal it. This is why we have diabetics on low dose aspirin daily, this prevents the platelets from sticking and can prevent plaque formation. In addition, your body takes cholesterol and tries to shove it into the open wound. This is why we want your LDL, or bad cholesterol, below 70.   The combination of platelets and cholesterol over 5-10 years forms plaque that can break off and cause a heart attack or stroke.   PLEASE REMEMBER:  Diabetes is preventable! Up to 22 percent of complications and morbidities among individuals with type 2 diabetes can be prevented, delayed, or effectively treated and minimized with regular visits to a health professional, appropriate monitoring and medication, and a healthy diet and lifestyle.  Recommendations For Diabetic/Prediabetic  Patients:   -  Take medications as prescribed  -  Recommend Dr Fara Olden Fuhrman's book "The End of Diabetes "  And "The End of Dieting"- Can get at  www.[email or web address].com and encourage also get the Audio CD book  - AVOID Animal products, ie. Meat - red/white, Poultry and Dairy/especially cheese - Exercise at least 5 times a week for 30 minutes or preferably daily.  - No Smoking - Drink less than 2 drinks a day.  - Monitor your feet for sores - Have yearly Eye Exams - Recommend annual Flu vaccine  - Recommend  Pneumovax and Prevnar vaccines - Shingles Vaccine (Zostavax) if over 68 y.o.  Goals:   - BMI less than 24 - Fasting sugar less than 130 or less than 150 if tapering medicines to lose weight  - Systolic BP less than [id number]  - Diastolic BP less than 80 - Bad LDL Cholesterol less than 70 - Triglycerides less than 150   We are giving you chantix for smoking cessation. You can do it! And we are here to help! You may have heard some scary side effects about chantix, the three most common I hear about are nausea, crazy dreams and depression.  However, I like for my patients to try to stay on 1/2 a tablet twice a day rather than one tablet twice a day as normally prescribed. This helps decrease the chances of side effects and helps save money by making a one month prescription last two months  Please start the prescription this way:  Start 1/2 tablet by mouth once daily after food with a full glass of water for 3 days Then do 1/2 tablet by mouth twice daily for 4 days.  At this point we have several options: 1) continue on 1/2 tablet twice a day- which I encourage you to do. You can stay on this dose the rest of the time on the medication or if you still feel the need to smoke you can do one of the two options below. 2) do one tablet in the morning and 1/2 in the evening which helps decrease dreams. 3) do one tablet twice a day.   What if I miss a dose? If you miss a dose, take it as soon as you can. If it is almost time for your next dose, take only that dose. Do not take double or extra doses.  What should I watch for while using this medicine? Visit your doctor or health care professional for regular check ups. Ask for ongoing advice and encouragement from your doctor or healthcare professional, friends, and family to help you quit. If you smoke while on this medication, quit again  Your mouth may get dry. Chewing sugarless gum or hard candy, and drinking plenty of water may help. Contact your  doctor if the problem does not go away or is severe.  You may get drowsy or dizzy. Do not drive, use machinery, or do anything that needs mental alertness until you know how this medicine affects you. Do not stand or sit up quickly, especially if you are an older patient.   The use of this medicine may increase the chance of suicidal thoughts or actions. Pay special attention to how you are responding while on this medicine. Any worsening of mood, or thoughts of suicide or dying should be reported to your health care professional right away.  ADVANTAGES OF QUITTING SMOKING  Within 20 minutes, blood pressure decreases. Your pulse is at normal level.  After 8 hours, carbon monoxide levels in the blood return to normal. Your oxygen level increases.  After 24 hours, the chance of having a heart attack starts to decrease. Your breath, hair, and body stop smelling like smoke.  After 48 hours, damaged nerve endings begin to recover. Your sense of taste and smell improve.  After 72 hours, the body is virtually free of nicotine. Your bronchial tubes relax and breathing becomes easier.  After 2 to 12 weeks, lungs can hold more air. Exercise becomes easier and circulation improves.  After 1 year, the risk of coronary heart disease is cut in half.  After 5 years, the risk of stroke falls to the same as a nonsmoker.  After 10 years, the risk of lung cancer is cut in half and the risk of other cancers decreases significantly.  After 15 years, the risk of coronary heart disease drops, usually to the level of a nonsmoker.  You will have extra money to spend on things other than cigarettes.

## 2014-10-25 ENCOUNTER — Ambulatory Visit
Admission: RE | Admit: 2014-10-25 | Discharge: 2014-10-25 | Disposition: A | Discharge status: Home / Self Care | Payer: Medicare Other | Source: Ambulatory Visit | Attending: Physician Assistant | Admitting: Physician Assistant

## 2014-10-25 DIAGNOSIS — R911 Solitary pulmonary nodule: Secondary | ICD-10-CM

## 2014-10-26 ENCOUNTER — Other Ambulatory Visit: Payer: Self-pay | Admitting: Internal Medicine

## 2014-10-28 ENCOUNTER — Ambulatory Visit: Payer: Medicare Other | Admitting: Family

## 2014-10-28 ENCOUNTER — Encounter (HOSPITAL_COMMUNITY): Payer: Medicare Other

## 2014-10-28 DIAGNOSIS — R278 Other lack of coordination: Secondary | ICD-10-CM | POA: Diagnosis not present

## 2014-10-28 DIAGNOSIS — M6281 Muscle weakness (generalized): Secondary | ICD-10-CM | POA: Diagnosis not present

## 2014-11-01 DIAGNOSIS — R278 Other lack of coordination: Secondary | ICD-10-CM | POA: Diagnosis not present

## 2014-11-01 DIAGNOSIS — M6281 Muscle weakness (generalized): Secondary | ICD-10-CM | POA: Diagnosis not present

## 2014-11-05 ENCOUNTER — Encounter: Payer: Self-pay | Admitting: Internal Medicine

## 2014-11-05 ENCOUNTER — Other Ambulatory Visit: Payer: Self-pay | Admitting: Physician Assistant

## 2014-11-05 ENCOUNTER — Other Ambulatory Visit: Payer: Self-pay

## 2014-11-05 MED ORDER — SIMVASTATIN 20 MG PO TABS: 20.0000 mg | ORAL_TABLET | Freq: Every day | ORAL | Status: DC

## 2014-11-05 NOTE — Telephone Encounter (Signed)
[name] from [facility] called [phone], states there may be possible interactions with Simvastatin and Amlodipine, per Estill Bamberg advised [name] at [facility] that ok to take together but she changed the Simvastatin from 40 to 20 mg and we will recheck at next visit

## 2014-11-08 ENCOUNTER — Encounter: Payer: Self-pay | Admitting: Family

## 2014-11-08 DIAGNOSIS — R278 Other lack of coordination: Secondary | ICD-10-CM | POA: Diagnosis not present

## 2014-11-08 DIAGNOSIS — M6281 Muscle weakness (generalized): Secondary | ICD-10-CM | POA: Diagnosis not present

## 2014-11-11 ENCOUNTER — Ambulatory Visit (INDEPENDENT_AMBULATORY_CARE_PROVIDER_SITE_OTHER): Payer: Medicare Other | Admitting: Family

## 2014-11-11 ENCOUNTER — Ambulatory Visit (HOSPITAL_COMMUNITY)
Admission: RE | Admit: 2014-11-11 | Discharge: 2014-11-11 | Disposition: A | Discharge status: Home / Self Care | Payer: Medicare Other | Source: Ambulatory Visit | Attending: Family | Admitting: Family

## 2014-11-11 ENCOUNTER — Encounter: Payer: Self-pay | Admitting: Family

## 2014-11-11 VITALS — BP 162/89 | HR 67 | Resp 16 | Ht 66.0 in | Wt 152.0 lb

## 2014-11-11 DIAGNOSIS — I639 Cerebral infarction, unspecified: Secondary | ICD-10-CM

## 2014-11-11 DIAGNOSIS — Z48812 Encounter for surgical aftercare following surgery on the circulatory system: Secondary | ICD-10-CM

## 2014-11-11 DIAGNOSIS — Z720 Tobacco use: Secondary | ICD-10-CM

## 2014-11-11 DIAGNOSIS — I70219 Atherosclerosis of native arteries of extremities with intermittent claudication, unspecified extremity: Secondary | ICD-10-CM | POA: Diagnosis not present

## 2014-11-11 DIAGNOSIS — E1159 Type 2 diabetes mellitus with other circulatory complications: Secondary | ICD-10-CM | POA: Diagnosis not present

## 2014-11-11 DIAGNOSIS — F17200 Nicotine dependence, unspecified, uncomplicated: Secondary | ICD-10-CM

## 2014-11-11 DIAGNOSIS — Z959 Presence of cardiac and vascular implant and graft, unspecified: Secondary | ICD-10-CM

## 2014-11-11 DIAGNOSIS — I739 Peripheral vascular disease, unspecified: Secondary | ICD-10-CM | POA: Diagnosis not present

## 2014-11-11 DIAGNOSIS — E1151 Type 2 diabetes mellitus with diabetic peripheral angiopathy without gangrene: Secondary | ICD-10-CM

## 2014-11-11 DIAGNOSIS — Z9889 Other specified postprocedural states: Secondary | ICD-10-CM | POA: Diagnosis not present

## 2014-11-11 NOTE — Progress Notes (Signed)
[facility] HISTORY AND PHYSICAL -PAD  History of Present Illness [name] is a 69 y.o. female patient of Dr. Trula Slade who is s/p stenting of her left superficial femoral artery in September of 2012. This alleviated her symptoms initially. 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  She has a known cerebral aneurysm followed by her neurologist who is also following her carotid arteries, has had several TIA's.   Her A1C was 9.4 May, 2015, was 8.26 September 2014 (review of records).  Was hospitalized January 2016 at [facility] then [facility] with loss of balance, states she was told that she did not have a stroke, was told that she was dehydrated. She is getting home physical therapy. She lost 43 pounds in 6 months but has recently gained 6 pounds. She is walking better. Her medications were adjusted.   Her feet ache and feel cold all the time, she sees a podiatrist.   Claudication pain in both calves equally, but much improved in the last 6 months, denies non-healing wounds.   Dr. Trula Slade stopped the Pletal since it did not help her claudication symptoms.  She reports arthritis pain in both knees.  She states she has stage 3 CKD.  Pt smoker: smoker (1/2 ppd x 40 yrs, is trying to quit)  Pt meds include:  Statin :Yes  ASA: Yes  Other anticoagulants/antiplatelets: allergic to Plavix    Past Medical History  Diagnosis Date  . Phlebitis     30 years ago  left leg  . Anxiety   . GERD (gastroesophageal reflux disease)   . Migraines   . Diabetes mellitus 1998  . Hyperlipidemia   . Hypertension 2000  . Stroke     multiple mini strokes ( brain aneurysm )  . 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 Status post colonoscopy 07/2010  . Abnormal findings on esophagogastroduodenoscopy (EGD) 07/2010  . Peripheral vascular disease     Social History History  Substance  Use Topics  . Smoking status: Current Every Day Smoker -- 0.25 packs/day for 40 years    Types: Cigarettes  . Smokeless tobacco: Never Used     Comment: pt states that she will try to smoke without any aid  . Alcohol Use: No    Family History Family History  Problem Relation Age of Onset  . CAD Mother 65    Died of MI  . Hypertension Mother   . Heart attack Mother   . Heart disease Mother   . CAD Father 49    Died of MI  . Heart disease Father   . Heart attack Father   . CAD Brother 69    Two brothers died of MI  . Heart attack Brother   . Heart disease Brother     Amputation  . Diabetes Sister   . Hypertension Sister   . Heart attack Brother   . Heart disease Brother     Past Surgical History  Procedure Laterality Date  . Cesarean section    . Cholecystectomy    . Knee surgery    . Rotator cuff surgery    . Thyroid surgery    . Hernia repair      times two  . Abdominal hysterectomy  1984  . Femoral artery stent  05/11/11    Left superficial femoral and popliteal artery  . Dental surgery  Aug.  16, 2013    left lower   . Femoral-popliteal bypass graft  Nov. 26, 2013    Left Angiogram-   . Eye surgery  Nov. 2014    Laser-Glaucoma  . Esophagogastroduodenoscopy  07/2010  . Colonoscopy  07/2010  . Abdominal aortagram N/A 01/04/2012    Procedure: ABDOMINAL Maxcine Ham;  Surgeon: Serafina Mitchell, MD;  Location: [facility] C[facility] LAB;  Service: Cardiovascular;  Laterality: N/A;  . Abdominal aortagram N/A 08/15/2012    Procedure: ABDOMINAL AORTAGRAM;  Surgeon: Serafina Mitchell, MD;  Location: [facility] CATH LAB;  Service: Cardiovascular;  Laterality: N/A;  . Lower extremity angiogram Left 08/15/2012    Procedure: LOWER EXTREMITY ANGIOGRAM;  Surgeon: Serafina Mitchell, MD;  Location: [facility] CATH LAB;  Service: Cardiovascular;  Laterality: Left;  lt leg angio    Allergies  Allergen Reactions  . Plavix [Clopidogrel Bisulfate] Palpitations  . Amoxicillin Itching and Swelling    FACE & EYES  SWELL  . Ace Inhibitors   . Lyrica [Pregabalin]   . Penicillins Other (See Comments)    REACTION: unspecified    Current Outpatient Prescriptions  Medication Sig Dispense Refill  . amLODipine (NORVASC) 5 MG tablet Take 1 tablet (5 mg total) by mouth daily. 30 tablet 11  . aspirin 325 MG tablet Take 325 mg by mouth daily.     . Blood Glucose Monitoring Suppl (ACCU-CHEK ACTIVE CARE KIT) KIT 1 Device by Does not apply route 3 (three) times daily. 1 each 0  . Canagliflozin (INVOKANA) 300 MG TABS Take 1/2 tyo 1 tablet daily as directed for Diabetes (Patient taking differently: Take 150 mg by mouth daily. Take 1/2 tyo 1 tablet daily as directed for Diabetes) 90 tablet 99  . Cholecalciferol (VITAMIN D3) 5000 UNITS TABS Take 1 capsule by mouth daily.    Marland Kitchen CINNAMON PO Take 1,000 mg by mouth 2 (two) times daily.     . citalopram (CELEXA) 20 MG tablet Take 20 mg by mouth daily.     . clonazePAM (KLONOPIN) 0.5 MG tablet Take 1 tablet (0.5 mg total) by mouth 2 (two) times daily. 60 tablet 0  . glucose blood test strip 1 each by Other route 3 (three) times daily. Dispense 3 month supply Accu Check testing strips 3 boxes of 100    . Insulin Aspart Prot & Aspart (NOVOLOG 70/30 MIX) (70-30) 100 UNIT/ML Pen Take up to 15 units  2 x daily by sliding scale as directed for Diabetes (Patient taking differently: Inject 10-12 Units into the skin 2 (two) times daily. Take 12 units in the morning and 10 units at bedtime adjust with sliding scale as directed for Diabetes) 30 mL 90  . IRON PO Take by mouth daily.    Marland Kitchen latanoprost (XALATAN) 0.005 % ophthalmic solution Place 1 drop into both eyes at bedtime.     Marland Kitchen losartan (COZAAR) 100 MG tablet Take 1 tablet (100 mg total) by mouth daily. 30 tablet 11  . losartan-hydrochlorothiazide (HYZAAR) 100-25 MG per tablet TAKE 1 TABLET BY MOUTH EVERY DAY FOR BLOOD PRESSURE 90 tablet 3  . metFORMIN (GLUCOPHAGE-XR) 500 MG 24 hr tablet TAKE 2 TABLETS BY MOUTH TWICE A DAY 360 tablet  4  . NOVOFINE 32G X 6 MM MISC USE AS DIRECTED 4 TIMES A DAY 4 each prn  . Omega-3 Fatty Acids (FISH OIL) 1200 MG CAPS Take 1 capsule by mouth daily.    Marland Kitchen omeprazole (PRILOSEC) 40 MG capsule TAKE ONE CAPSULE BY MOUTH EVERY DAY 90 capsule  3  . simvastatin (ZOCOR) 20 MG tablet Take 1 tablet (20 mg total) by mouth daily. 90 tablet 1  . sitaGLIPtin (JANUVIA) 100 MG tablet Take 1/2 to 1 tablet daily as directed for Diabetes (Patient taking differently: Take 100 mg by mouth daily. ) 90 tablet 99  . UNABLE TO FIND Outpatient OT 1 each 0  . vitamin E (VITAMIN E) 400 UNIT capsule Take 400 Units by mouth daily.    Marland Kitchen zinc gluconate 50 MG tablet Take 50 mg by mouth daily.     No current facility-administered medications for this visit.    ROS: See HPI for pertinent positives and negatives.   Physical Examination  Filed Vitals:   11/11/14 0945  BP: 162/89  Pulse: 67  Resp: 16  Height: _0  (1.676 m)  Weight: 152 lb (68.947 kg)  SpO2: 99%   Body mass index is 24.55 kg/(m^2).   Not General: A&O x 3, WDWN,  Gait: normal  Eyes: PERRLA,  Pulmonary: CTAB, without wheezes , rales or rhonchi  Cardiac: regular Rythm , no detected murmur  Carotid Bruits  Left  Right    Negative  Negative   Aorta: is not palpable  Radial pulses: are 2+ and palpable  VASCULAR EXAM:  Extremities without ischemic changes  without Gangrene; without open wounds.   LE Pulses  LEFT  RIGHT   FEMORAL  palpable  palpable   POPLITEAL  not palpable  not palpable   POSTERIOR TIBIAL  not palpable  Not palpable   DORSALIS PEDIS  ANTERIOR TIBIAL  not palpable  not palpable          Abdomen: soft, mild tenderness to palpation at umbilicus, no palpable masses.  Skin: no rashes, no ulcers.  Musculoskeletal: no muscle wasting or atrophy.  Neurologic: A&O X 3; Appropriate Affect ; SENSATION: normal; MOTOR FUNCTION: moving all extremities equally, motor strength 4/5 in UE's, 4/5 in LE's.  Speech is fluent/normal. CN 2-12 intact.   Non-Invasive Vascular Imaging: DATE: 11/11/2014  ABI (Date: 11/11/2014)  R: 0.68 (04/26/14, 0.61), DP: biphasic, PT: monophasic, TBI: 0.48  L: 0.61 (0.59), DP: monophasic, PT: monophasic, TBI: 0.42   ASSESSMENT: [name] is a 69 y.o. female who is s/p stenting of her left superficial femoral artery in September of 2012. This alleviated her symptoms initially. 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  She has a known cerebral aneurysm followed by her neurologist who is also following her carotid arteries, has had several TIA's.   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 Her claudication symptoms have improved slightly with walking. Unfortunately she continues to smoke, see Plan. Her DM is also uncontrolled but improving, she is working closely with her medical provider to bring her DM under better control. Pt attributes much of her improvement to physical therapy and medication adjustment.  Face to face time with patient was 25 minutes. Over 50% of this time was spent on counseling and coordination of care.    PLAN:  The patient was counseled re smoking cessation and given several free resources re smoking cessation.  I discussed in depth with the patient the nature of atherosclerosis, and emphasized the importance of maximal medical management including strict control of blood pressure, blood glucose, and lipid levels, obtaining regular exercise, and cessation of smoking.  The patient is aware that without maximal medical management the underlying atherosclerotic disease process  will progress, limiting the benefit of any interventions.  Based on the patient's vascular studies and examination, pt will return to clinic in 1 year with ABI's and see NP, she knows to return sooner if needed.  The patient was  given information about PAD including signs, symptoms, treatment, what symptoms should prompt the patient to seek immediate medical care, and risk reduction measures to take.  Clemon Chambers, RN, MSN, FNP-C [facility] Phone: [phone]  Clinic MD: Trula Slade  11/11/2014 9:28 AM

## 2014-11-11 NOTE — Patient Instructions (Addendum)
Peripheral Vascular Disease Peripheral Vascular Disease (PVD), also called Peripheral Arterial Disease (PAD), is a circulation problem caused by cholesterol (atherosclerotic plaque) deposits in the arteries. PVD commonly occurs in the lower extremities (legs) but it can occur in other areas of the body, such as your arms. The cholesterol buildup in the arteries reduces blood flow which can cause pain and other serious problems. The presence of PVD can place a person at risk for Coronary Artery Disease (CAD).  CAUSES  Causes of PVD can be many. It is usually associated with more than one risk factor such as:   High Cholesterol.  Smoking.  Diabetes.  Lack of exercise or inactivity.  High blood pressure (hypertension).  Obesity.  Family history. SYMPTOMS   When the lower extremities are affected, patients with PVD may experience:  Leg pain with exertion or physical activity. This is called INTERMITTENT CLAUDICATION. This may present as cramping or numbness with physical activity. The location of the pain is associated with the level of blockage. For example, blockage at the abdominal level (distal abdominal aorta) may result in buttock or hip pain. Lower leg arterial blockage may result in calf pain.  As PVD becomes more severe, pain can develop with less physical activity.  In people with severe PVD, leg pain may occur at rest.  Other PVD signs and symptoms:  Leg numbness or weakness.  Coldness in the affected leg or foot, especially when compared to the other leg.  A change in leg color.  Patients with significant PVD are more prone to ulcers or sores on toes, feet or legs. These may take longer to heal or may reoccur. The ulcers or sores can become infected.  If signs and symptoms of PVD are ignored, gangrene may occur. This can result in the loss of toes or loss of an entire limb.  Not all leg pain is related to PVD. Other medical conditions can cause leg pain such  as:  Blood clots (embolism) or Deep Vein Thrombosis.  Inflammation of the blood vessels (vasculitis).  Spinal stenosis. DIAGNOSIS  Diagnosis of PVD can involve several different types of tests. These can include:  Pulse Volume Recording Method (PVR). This test is simple, painless and does not involve the use of X-rays. PVR involves measuring and comparing the blood pressure in the arms and legs. An ABI (Ankle-Brachial Index) is calculated. The normal ratio of blood pressures is 1. As this number becomes smaller, it indicates more severe disease.  < 0.95 - indicates significant narrowing in one or more leg vessels.  <0.8 - there will usually be pain in the foot, leg or buttock with exercise.  <0.4 - will usually have pain in the legs at rest.  <0.25 - usually indicates limb threatening PVD.  Doppler detection of pulses in the legs. This test is painless and checks to see if you have a pulses in your legs/feet.  A dye or contrast material (a substance that highlights the blood vessels so they show up on x-ray) may be given to help your caregiver better see the arteries for the following tests. The dye is eliminated from your body by the kidney's. Your caregiver may order blood work to check your kidney function and other laboratory values before the following tests are performed:  Magnetic Resonance Angiography (MRA). An MRA is a picture study of the blood vessels and arteries. The MRA machine uses a large magnet to produce images of the blood vessels.  Computed Tomography Angiography (CTA). A CTA   is a specialized x-ray that looks at how the blood flows in your blood vessels. An IV may be inserted into your arm so contrast dye can be injected.  Angiogram. Is a procedure that uses x-rays to look at your blood vessels. This procedure is minimally invasive, meaning a small incision (cut) is made in your groin. A small tube (catheter) is then inserted into the artery of your groin. The catheter  is guided to the blood vessel or artery your caregiver wants to examine. Contrast dye is injected into the catheter. X-rays are then taken of the blood vessel or artery. After the images are obtained, the catheter is taken out. TREATMENT  Treatment of PVD involves many interventions which may include:  Lifestyle changes:  Quitting smoking.  Exercise.  Following a low fat, low cholesterol diet.  Control of diabetes.  Foot care is very important to the PVD patient. Good foot care can help prevent infection.  Medication:  Cholesterol-lowering medicine.  Blood pressure medicine.  Anti-platelet drugs.  Certain medicines may reduce symptoms of Intermittent Claudication.  Interventional/Surgical options:  Angioplasty. An Angioplasty is a procedure that inflates a balloon in the blocked artery. This opens the blocked artery to improve blood flow.  Stent Implant. A wire mesh tube (stent) is placed in the artery. The stent expands and stays in place, allowing the artery to remain open.  Peripheral Bypass Surgery. This is a surgical procedure that reroutes the blood around a blocked artery to help improve blood flow. This type of procedure may be performed if Angioplasty or stent implants are not an option. SEEK IMMEDIATE MEDICAL CARE IF:   You develop pain or numbness in your arms or legs.  Your arm or leg turns cold, becomes blue in color.  You develop redness, warmth, swelling and pain in your arms or legs. MAKE SURE YOU:   Understand these instructions.  Will watch your condition.  Will get help right away if you are not doing well or get worse. Document Released: 10/14/2004 Document Revised: 11/29/2011 Document Reviewed: 09/10/2008 Exit[employer] Patient Information 2015 [employer]. This information is not intended to replace advice given to you by your health care provider. Make sure you discuss any questions you have with your health care provider.    Smoking  Cessation Quitting smoking is important to your health and has many advantages. However, it is not always easy to quit since nicotine is a very addictive drug. Oftentimes, people try 3 times or more before being able to quit. This document explains the best ways for you to prepare to quit smoking. Quitting takes hard work and a lot of effort, but you can do it. ADVANTAGES OF QUITTING SMOKING  You will live longer, feel better, and live better.  Your body will feel the impact of quitting smoking almost immediately.  Within 20 minutes, blood pressure decreases. Your pulse returns to its normal level.  After 8 hours, carbon monoxide levels in the blood return to normal. Your oxygen level increases.  After 24 hours, the chance of having a heart attack starts to decrease. Your breath, hair, and body stop smelling like smoke.  After 48 hours, damaged nerve endings begin to recover. Your sense of taste and smell improve.  After 72 hours, the body is virtually free of nicotine. Your bronchial tubes relax and breathing becomes easier.  After 2 to 12 weeks, lungs can hold more air. Exercise becomes easier and circulation improves.  The risk of having a heart attack, stroke,   cancer, or lung disease is greatly reduced.  After 1 year, the risk of coronary heart disease is cut in half.  After 5 years, the risk of stroke falls to the same as a nonsmoker.  After 10 years, the risk of lung cancer is cut in half and the risk of other cancers decreases significantly.  After 15 years, the risk of coronary heart disease drops, usually to the level of a nonsmoker.  If you are pregnant, quitting smoking will improve your chances of having a healthy baby.  The people you live with, especially any children, will be healthier.  You will have extra money to spend on things other than cigarettes. QUESTIONS TO THINK ABOUT BEFORE ATTEMPTING TO QUIT You may want to talk about your answers with your health care  provider.  Why do you want to quit?  If you tried to quit in the past, what helped and what did not?  What will be the most difficult situations for you after you quit? How will you plan to handle them?  Who can help you through the tough times? Your family? Friends? A health care provider?  What pleasures do you get from smoking? What ways can you still get pleasure if you quit? Here are some questions to ask your health care provider:  How can you help me to be successful at quitting?  What medicine do you think would be best for me and how should I take it?  What should I do if I need more help?  What is smoking withdrawal like? How can I get information on withdrawal? GET READY  Set a quit date.  Change your environment by getting rid of all cigarettes, ashtrays, matches, and lighters in your home, car, or work. Do not let people smoke in your home.  Review your past attempts to quit. Think about what worked and what did not. GET SUPPORT AND ENCOURAGEMENT You have a better chance of being successful if you have help. You can get support in many ways.  Tell your family, friends, and coworkers that you are going to quit and need their support. Ask them not to smoke around you.  Get individual, group, or telephone counseling and support. Programs are available at local hospitals and health centers. Call your local health department for information about programs in your area.  Spiritual beliefs and practices may help some smokers quit.  Download a "quit meter" on your computer to keep track of quit statistics, such as how long you have gone without smoking, cigarettes not smoked, and money saved.  Get a self-help book about quitting smoking and staying off tobacco. LEARN NEW SKILLS AND BEHAVIORS  Distract yourself from urges to smoke. Talk to someone, go for a walk, or occupy your time with a task.  Change your normal routine. Take a different route to work. Drink tea  instead of coffee. Eat breakfast in a different place.  Reduce your stress. Take a hot bath, exercise, or read a book.  Plan something enjoyable to do every day. Reward yourself for not smoking.  Explore interactive web-based programs that specialize in helping you quit. GET MEDICINE AND USE IT CORRECTLY Medicines can help you stop smoking and decrease the urge to smoke. Combining medicine with the above behavioral methods and support can greatly increase your chances of successfully quitting smoking.  Nicotine replacement therapy helps deliver nicotine to your body without the negative effects and risks of smoking. Nicotine replacement therapy includes nicotine gum, lozenges,   inhalers, nasal sprays, and skin patches. Some may be available over-the-counter and others require a prescription.  Antidepressant medicine helps people abstain from smoking, but how this works is unknown. This medicine is available by prescription.  Nicotinic receptor partial agonist medicine simulates the effect of nicotine in your brain. This medicine is available by prescription. Ask your health care provider for advice about which medicines to use and how to use them based on your health history. Your health care provider will tell you what side effects to look out for if you choose to be on a medicine or therapy. Carefully read the information on the package. Do not use any other product containing nicotine while using a nicotine replacement product.  RELAPSE OR DIFFICULT SITUATIONS Most relapses occur within the first 3 months after quitting. Do not be discouraged if you start smoking again. Remember, most people try several times before finally quitting. You may have symptoms of withdrawal because your body is used to nicotine. You may crave cigarettes, be irritable, feel very hungry, cough often, get headaches, or have difficulty concentrating. The withdrawal symptoms are only temporary. They are strongest when you  first quit, but they will go away within 10-14 days. To reduce the chances of relapse, try to:  Avoid drinking alcohol. Drinking lowers your chances of successfully quitting.  Reduce the amount of caffeine you consume. Once you quit smoking, the amount of caffeine in your body increases and can give you symptoms, such as a rapid heartbeat, sweating, and anxiety.  Avoid smokers because they can make you want to smoke.  Do not let weight gain distract you. Many smokers will gain weight when they quit, usually less than 10 pounds. Eat a healthy diet and stay active. You can always lose the weight gained after you quit.  Find ways to improve your mood other than smoking. FOR MORE INFORMATION  www.smokefree.gov  Document Released: 08/31/2001 Document Revised: 01/21/2014 Document Reviewed: 12/16/2011 [employer] Patient Information 2015 [employer]. This information is not intended to replace advice given to you by your health care provider. Make sure you discuss any questions you have with your health care provider.    Smoking Cessation, Tips for Success If you are ready to quit smoking, congratulations! You have chosen to help yourself be healthier. Cigarettes bring nicotine, tar, carbon monoxide, and other irritants into your body. Your lungs, heart, and blood vessels will be able to work better without these poisons. There are many different ways to quit smoking. Nicotine gum, nicotine patches, a nicotine inhaler, or nicotine nasal spray can help with physical craving. Hypnosis, support groups, and medicines help break the habit of smoking. WHAT THINGS CAN I DO TO MAKE QUITTING EASIER?  Here are some tips to help you quit for good:  Pick a date when you will quit smoking completely. Tell all of your friends and family about your plan to quit on that date.  Do not try to slowly cut down on the number of cigarettes you are smoking. Pick a quit date and quit smoking completely starting on that  day.  Throw away all cigarettes.   Clean and remove all ashtrays from your home, work, and car.  On a card, write down your reasons for quitting. Carry the card with you and read it when you get the urge to smoke.  Cleanse your body of nicotine. Drink enough water and fluids to keep your urine clear or pale yellow. Do this after quitting to flush the nicotine from   your body.  Learn to predict your moods. Do not let a bad situation be your excuse to have a cigarette. Some situations in your life might tempt you into wanting a cigarette.  Never have "just one" cigarette. It leads to wanting another and another. Remind yourself of your decision to quit.  Change habits associated with smoking. If you smoked while driving or when feeling stressed, try other activities to replace smoking. Stand up when drinking your coffee. Brush your teeth after eating. Sit in a different chair when you read the paper. Avoid alcohol while trying to quit, and try to drink fewer caffeinated beverages. Alcohol and caffeine may urge you to smoke.  Avoid foods and drinks that can trigger a desire to smoke, such as sugary or spicy foods and alcohol.  Ask people who smoke not to smoke around you.  Have something planned to do right after eating or having a cup of coffee. For example, plan to take a walk or exercise.  Try a relaxation exercise to calm you down and decrease your stress. Remember, you may be tense and nervous for the first 2 weeks after you quit, but this will pass.  Find new activities to keep your hands busy. Play with a pen, coin, or rubber band. Doodle or draw things on paper.  Brush your teeth right after eating. This will help cut down on the craving for the taste of tobacco after meals. You can also try mouthwash.   Use oral substitutes in place of cigarettes. Try using lemon drops, carrots, cinnamon sticks, or chewing gum. Keep them handy so they are available when you have the urge to  smoke.  When you have the urge to smoke, try deep breathing.  Designate your home as a nonsmoking area.  If you are a heavy smoker, ask your health care provider about a prescription for nicotine chewing gum. It can ease your withdrawal from nicotine.  Reward yourself. Set aside the cigarette money you save and buy yourself something nice.  Look for support from others. Join a support group or smoking cessation program. Ask someone at home or at work to help you with your plan to quit smoking.  Always ask yourself, "Do I need this cigarette or is this just a reflex?" Tell yourself, "Today, I choose not to smoke," or "I do not want to smoke." You are reminding yourself of your decision to quit.  Do not replace cigarette smoking with electronic cigarettes (commonly called e-cigarettes). The safety of e-cigarettes is unknown, and some may contain harmful chemicals.  If you relapse, do not give up! Plan ahead and think about what you will do the next time you get the urge to smoke. HOW WILL I FEEL WHEN I QUIT SMOKING? You may have symptoms of withdrawal because your body is used to nicotine (the addictive substance in cigarettes). You may crave cigarettes, be irritable, feel very hungry, cough often, get headaches, or have difficulty concentrating. The withdrawal symptoms are only temporary. They are strongest when you first quit but will go away within 10-14 days. When withdrawal symptoms occur, stay in control. Think about your reasons for quitting. Remind yourself that these are signs that your body is healing and getting used to being without cigarettes. Remember that withdrawal symptoms are easier to treat than the major diseases that smoking can cause.  Even after the withdrawal is over, expect periodic urges to smoke. However, these cravings are generally short lived and will go away whether you   smoke or not. Do not smoke! WHAT RESOURCES ARE AVAILABLE TO HELP ME QUIT SMOKING? Your health care  provider can direct you to community resources or hospitals for support, which may include:  Group support.  Education.  Hypnosis.  Therapy. Document Released: 06/04/2004 Document Revised: 01/21/2014 Document Reviewed: 02/22/2013 [employer] Patient Information 2015 [employer]. This information is not intended to replace advice given to you by your health care provider. Make sure you discuss any questions you have with your health care provider.  

## 2014-11-14 ENCOUNTER — Telehealth: Payer: Self-pay

## 2014-11-14 ENCOUNTER — Encounter (HOSPITAL_COMMUNITY): Payer: Self-pay

## 2014-11-14 ENCOUNTER — Other Ambulatory Visit: Payer: Self-pay

## 2014-11-14 ENCOUNTER — Emergency Department (HOSPITAL_COMMUNITY)
Admission: EM | Admit: 2014-11-14 | Discharge: 2014-11-14 | Disposition: A | Discharge status: Home / Self Care | Payer: Medicare Other | Attending: Emergency Medicine | Admitting: Emergency Medicine

## 2014-11-14 DIAGNOSIS — Z79899 Other long term (current) drug therapy: Secondary | ICD-10-CM | POA: Insufficient documentation

## 2014-11-14 DIAGNOSIS — E119 Type 2 diabetes mellitus without complications: Secondary | ICD-10-CM | POA: Insufficient documentation

## 2014-11-14 DIAGNOSIS — E785 Hyperlipidemia, unspecified: Secondary | ICD-10-CM | POA: Insufficient documentation

## 2014-11-14 DIAGNOSIS — Z720 Tobacco use: Secondary | ICD-10-CM | POA: Diagnosis not present

## 2014-11-14 DIAGNOSIS — Z9889 Other specified postprocedural states: Secondary | ICD-10-CM | POA: Diagnosis not present

## 2014-11-14 DIAGNOSIS — Z7982 Long term (current) use of aspirin: Secondary | ICD-10-CM | POA: Diagnosis not present

## 2014-11-14 DIAGNOSIS — K219 Gastro-esophageal reflux disease without esophagitis: Secondary | ICD-10-CM | POA: Insufficient documentation

## 2014-11-14 DIAGNOSIS — Z8673 Personal history of transient ischemic attack (TIA), and cerebral infarction without residual deficits: Secondary | ICD-10-CM | POA: Insufficient documentation

## 2014-11-14 DIAGNOSIS — Z794 Long term (current) use of insulin: Secondary | ICD-10-CM | POA: Diagnosis not present

## 2014-11-14 DIAGNOSIS — Z880 Allergy status to penicillin: Secondary | ICD-10-CM | POA: Insufficient documentation

## 2014-11-14 DIAGNOSIS — F419 Anxiety disorder, unspecified: Secondary | ICD-10-CM | POA: Insufficient documentation

## 2014-11-14 DIAGNOSIS — I10 Essential (primary) hypertension: Secondary | ICD-10-CM | POA: Diagnosis not present

## 2014-11-14 MED ORDER — AMLODIPINE BESYLATE 5 MG PO TABS
5.0000 mg | ORAL_TABLET | Freq: Once | ORAL | Status: AC
  Administered 2014-11-14: 5 mg via ORAL
  Filled 2014-11-14: qty 1

## 2014-11-14 MED ORDER — AMLODIPINE BESYLATE 10 MG PO TABS: 10.0000 mg | ORAL_TABLET | Freq: Every day | ORAL | Status: DC

## 2014-11-14 NOTE — Discharge Instructions (Signed)
Increase amlodipine to 10 mg daily. New prescription given. You can also take two of your 5 mg tablets instead of one 10 mg tablet.  Follow-up your primary care doctor. Keep a blood pressure log.

## 2014-11-14 NOTE — Telephone Encounter (Signed)
Pt called c/o headache & elevated BP 195/95. Pt has hx of Stroke. Per [name], PA-C pt is advised to go to ER. Pt aware.

## 2014-11-14 NOTE — ED Provider Notes (Signed)
CSN: [id number]     Arrival date & time 11/14/14  1118 History   First MD Initiated Contact with Patient 11/14/14 1136     Chief Complaint  Patient presents with  . Hypertension     (Consider location/radiation/quality/duration/timing/severity/associated sxs/prior Treatment) HPI.... Concern about elevated blood pressure today. Patient is currently taking Norvasc 5 mg daily. No neurological deficits, stiff neck, fever, chills. Blood pressure at home was in the 315-400 systolic with a normal diastolic. She has a slight headache. She does not confirm blurred vision or dizziness with me  Past Medical History  Diagnosis Date  . Phlebitis     30 years ago  left leg  . Anxiety   . GERD (gastroesophageal reflux disease)   . Migraines   . Diabetes mellitus 1998  . Hyperlipidemia   . Hypertension 2000  . Stroke     multiple mini strokes ( brain aneurysm )  . 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 Status post colonoscopy 07/2010  . Abnormal findings on esophagogastroduodenoscopy (EGD) 07/2010  . Peripheral vascular disease    Past Surgical History  Procedure Laterality Date  . Cesarean section    . Cholecystectomy    . Knee surgery    . Rotator cuff surgery    . Thyroid surgery    . Hernia repair      times two  . Abdominal hysterectomy  1984  . Femoral artery stent  05/11/11    Left superficial femoral and popliteal artery  . Dental surgery  Aug. 16, 2013    left lower   . Femoral-popliteal bypass graft  Nov. 26, 2013    Left Angiogram-   . Eye surgery  Nov. 2014    Laser-Glaucoma  . Esophagogastroduodenoscopy  07/2010  . Colonoscopy  07/2010  . Abdominal aortagram N/A 01/04/2012    Procedure: ABDOMINAL Maxcine Ham;  Surgeon: Serafina Mitchell, MD;  Location: [facility] CATH LAB;  Service: Cardiovascular;  Laterality: N/A;  . Abdominal aortagram N/A 08/15/2012    Procedure: ABDOMINAL AORTAGRAM;  Surgeon: Serafina Mitchell, MD;  Location: [facility] CATH LAB;  Service: Cardiovascular;  Laterality: N/A;   . Lower extremity angiogram Left 08/15/2012    Procedure: LOWER EXTREMITY ANGIOGRAM;  Surgeon: Serafina Mitchell, MD;  Location: [facility] CATH LAB;  Service: Cardiovascular;  Laterality: Left;  lt leg angio   Family History  Problem Relation Age of Onset  . CAD Mother 48    Died of MI  . Hypertension Mother   . Heart attack Mother   . Heart disease Mother   . CAD Father 3    Died of MI  . Heart disease Father   . Heart attack Father   . CAD Brother 67    Two brothers died of MI  . Heart attack Brother   . Heart disease Brother     Amputation  . Diabetes Sister   . Hypertension Sister   . Heart attack Brother   . Heart disease Brother    History  Substance Use Topics  . Smoking status: Light Tobacco Smoker -- 0.25 packs/day for 40 years    Types: Cigarettes  . Smokeless tobacco: Never Used     Comment: pt states that she will try to smoke without any aid  . Alcohol Use: No   OB History    No data available     Review of Systems  All other systems reviewed and are negative.     Allergies  Plavix; Amoxicillin;  Ace inhibitors; Lyrica; and Penicillins  Home Medications   Prior to Admission medications   Medication Sig Start Date End Date Taking? Authorizing Provider  amLODipine (NORVASC) 10 MG tablet Take 1 tablet (10 mg total) by mouth daily. 11/14/14   Nat Christen, MD  aspirin 325 MG tablet Take 325 mg by mouth daily.     Historical Provider, MD  Blood Glucose Monitoring Suppl (ACCU-CHEK ACTIVE CARE KIT) KIT 1 Device by Does not apply route 3 (three) times daily. 07/30/13   [name], MD  Canagliflozin (INVOKANA) 300 MG TABS Take 1/2 tyo 1 tablet daily as directed for Diabetes Patient taking differently: Take 150 mg by mouth daily. Take 1/2 tyo 1 tablet daily as directed for Diabetes 05/13/14 05/14/15  [name], MD  Cholecalciferol (VITAMIN D3) 5000 UNITS TABS Take 1 capsule by mouth daily.    Historical Provider, MD  CINNAMON PO Take 1,000 mg by mouth 2 (two)  times daily.     Historical Provider, MD  citalopram (CELEXA) 20 MG tablet Take 20 mg by mouth daily.  02/08/14   Historical Provider, MD  clonazePAM (KLONOPIN) 0.5 MG tablet Take 1 tablet (0.5 mg total) by mouth 2 (two) times daily. 10/21/14 10/21/15  [name], PA-C  glucose blood test strip 1 each by Other route 3 (three) times daily. Dispense 3 month supply Accu Check testing strips 3 boxes of 100    Historical Provider, MD  Insulin Aspart Prot & Aspart (NOVOLOG 70/30 MIX) (70-30) 100 UNIT/ML Pen Take up to 15 units  2 x daily by sliding scale as directed for Diabetes Patient taking differently: Inject 10-12 Units into the skin 2 (two) times daily. Take 12 units in the morning and 10 units at bedtime adjust with sliding scale as directed for Diabetes 05/13/14 05/14/15  [name], MD  IRON PO Take by mouth daily.    Historical Provider, MD  latanoprost (XALATAN) 0.005 % ophthalmic solution Place 1 drop into both eyes at bedtime.     Historical Provider, MD  losartan (COZAAR) 100 MG tablet Take 1 tablet (100 mg total) by mouth daily. 10/21/14 10/21/15  [name], PA-C  losartan-hydrochlorothiazide (HYZAAR) 100-25 MG per tablet TAKE 1 TABLET BY MOUTH EVERY DAY FOR BLOOD PRESSURE    [name], PA-C  metFORMIN (GLUCOPHAGE-XR) 500 MG 24 hr tablet TAKE 2 TABLETS BY MOUTH TWICE A DAY 03/16/14   [name], MD  NOVOFINE 32G X 6 MM MISC USE AS DIRECTED 4 TIMES A DAY 05/13/14   [name], PA-C  Omega-3 Fatty Acids (FISH OIL) 1200 MG CAPS Take 1 capsule by mouth daily.    Historical Provider, MD  omeprazole (PRILOSEC) 40 MG capsule TAKE ONE CAPSULE BY MOUTH EVERY DAY 07/12/14   [name], MD  simvastatin (ZOCOR) 20 MG tablet Take 1 tablet (20 mg total) by mouth daily. 11/05/14   [name], PA-C  sitaGLIPtin (JANUVIA) 100 MG tablet Take 1/2 to 1 tablet daily as directed for Diabetes 05/13/14 05/14/15  [name], MD  UNABLE TO FIND Outpatient OT 10/05/14   Caren Griffins,  MD  vitamin E (VITAMIN E) 400 UNIT capsule Take 400 Units by mouth daily.    Historical Provider, MD  zinc gluconate 50 MG tablet Take 50 mg by mouth daily.    Historical Provider, MD   BP 175/71 mmHg  Pulse 66  Temp(Src) 98.3 F (36.8 C) (Oral)  Resp 19  Ht _0  (1.676 m)  Wt 155 lb (70.308 kg)  BMI 25.03 kg/m2  SpO2 96% Physical Exam  Constitutional: She is oriented to person, place, and time. She appears well-developed and well-nourished.  HENT:  Head: Normocephalic and atraumatic.  Eyes: Conjunctivae and EOM are normal. Pupils are equal, round, and reactive to light.  Neck: Normal range of motion. Neck supple.  Cardiovascular: Normal rate and regular rhythm.   Pulmonary/Chest: Effort normal and breath sounds normal.  Abdominal: Soft. Bowel sounds are normal.  Musculoskeletal: Normal range of motion.  Neurological: She is alert and oriented to person, place, and time.  Skin: Skin is warm and dry.  Psychiatric: She has a normal mood and affect. Her behavior is normal.  Nursing note and vitals reviewed.   ED Course  Procedures (including critical care time) Labs Review Labs Reviewed - No data to display  Imaging Review No results found.   EKG Interpretation   Date/Time:  Thursday November 14 2014 11:35:01 EST Ventricular Rate:  72 PR Interval:  132 QRS Duration: 90 QT Interval:  458 QTC Calculation: 501 R Axis:   59 Text Interpretation:  Normal sinus rhythm Prolonged QT Abnormal ECG  Confirmed by Deandrew Hoecker  MD, Shantee Hayne ([id number]) on 11/14/2014 11:39:17 AM      MDM   Final diagnoses:  Essential hypertension    Patient has known essential hypertension. Physical exam today was normal. Recommend increasing her Norvasc from 5-10 mg daily. Patient is neurologically intact. This was discussed with the patient and her family.    Nat Christen, MD 11/21/14 1540

## 2014-11-14 NOTE — ED Notes (Signed)
MD at bedside discussing possible increase in BP medication.

## 2014-11-14 NOTE — ED Notes (Signed)
Pt reports hypertension, dizziness and blurred vision at home. Sts they recently changed her BP medication a couple of weeks.  Reports BPs as high as 190 at home.

## 2014-11-15 ENCOUNTER — Encounter: Payer: Self-pay | Admitting: Internal Medicine

## 2014-11-18 DIAGNOSIS — M6281 Muscle weakness (generalized): Secondary | ICD-10-CM | POA: Diagnosis not present

## 2014-11-18 DIAGNOSIS — R278 Other lack of coordination: Secondary | ICD-10-CM | POA: Diagnosis not present

## 2014-11-22 DIAGNOSIS — R2681 Unsteadiness on feet: Secondary | ICD-10-CM | POA: Diagnosis not present

## 2014-11-22 DIAGNOSIS — I672 Cerebral atherosclerosis: Secondary | ICD-10-CM | POA: Diagnosis not present

## 2014-11-22 DIAGNOSIS — G43119 Migraine with aura, intractable, without status migrainosus: Secondary | ICD-10-CM | POA: Diagnosis not present

## 2014-11-22 DIAGNOSIS — F4321 Adjustment disorder with depressed mood: Secondary | ICD-10-CM | POA: Diagnosis not present

## 2014-11-22 DIAGNOSIS — R9082 White matter disease, unspecified: Secondary | ICD-10-CM | POA: Diagnosis not present

## 2014-11-22 DIAGNOSIS — E1140 Type 2 diabetes mellitus with diabetic neuropathy, unspecified: Secondary | ICD-10-CM | POA: Diagnosis not present

## 2014-11-22 DIAGNOSIS — I671 Cerebral aneurysm, nonruptured: Secondary | ICD-10-CM | POA: Diagnosis not present

## 2014-11-29 ENCOUNTER — Ambulatory Visit: Payer: Medicare Other | Admitting: Podiatrist

## 2014-12-02 DIAGNOSIS — R278 Other lack of coordination: Secondary | ICD-10-CM | POA: Diagnosis not present

## 2014-12-02 DIAGNOSIS — M6281 Muscle weakness (generalized): Secondary | ICD-10-CM | POA: Diagnosis not present

## 2014-12-05 ENCOUNTER — Encounter: Payer: Self-pay | Admitting: Podiatrist

## 2014-12-05 ENCOUNTER — Ambulatory Visit (INDEPENDENT_AMBULATORY_CARE_PROVIDER_SITE_OTHER): Payer: Medicare Other | Admitting: Podiatrist

## 2014-12-05 DIAGNOSIS — B351 Tinea unguium: Secondary | ICD-10-CM

## 2014-12-05 DIAGNOSIS — M79676 Pain in unspecified toe(s): Secondary | ICD-10-CM | POA: Diagnosis not present

## 2014-12-09 DIAGNOSIS — E119 Type 2 diabetes mellitus without complications: Secondary | ICD-10-CM | POA: Diagnosis not present

## 2014-12-09 DIAGNOSIS — H2513 Age-related nuclear cataract, bilateral: Secondary | ICD-10-CM | POA: Diagnosis not present

## 2014-12-09 DIAGNOSIS — H40023 Open angle with borderline findings, high risk, bilateral: Secondary | ICD-10-CM | POA: Diagnosis not present

## 2014-12-13 ENCOUNTER — Encounter: Payer: Self-pay | Admitting: Internal Medicine

## 2014-12-19 NOTE — Progress Notes (Signed)
Subjective: Patient presents for foot and nail care.  She is a patient of Dr. Trula Slade.  She has diagnosed PVD and bypass is not recommended for her.  Her stents have not stayed open according to the patient. She has a slight listers corn on the lateral side of the right fifth toe. She normally tries to trim her own nails before coming for her visits however this time she left them alone.   Physical Exam  Vascular exam reveals nonpalpable pedal pulses DP or PT bilateral. Shiny atrophic skin is noted bilateral. Absence of digital hair growth is noted, capillary refill time is increased bilateral. Proximal distal cooling it warm to warm bilateral.  Neurological examination reveals decrease in sensation via Semmes Weinstein monofilament at 3/5 sites bilateral, vibratory sensation and light touch are also decreased bilateral,  Musculoskeletal examination reveals acceptable muscle strength and stability bilateral  Dermatological examination reveals minimally elongated toenails one through five bilateral. Minimal hyperkeratotic tissue around the edges of the fifth toenails is also present. Patient complains of painful corns on the top of the left fourth toe   Non-Invasive Vascular Imaging: DATE: 04/26/2014  ABI: RIGHT 0.61, Waveforms: monophasic; LEFT 0.59, Waveforms: monophasic  Previous (10/22/13) ABI's: Right: 0.69, Left: 0.62  Assessment & Plan:   Assessment is peripheral vascular disease being treated by Dr. [name], symptomatic toenails first thru five bilateral, soft corn dorsal left fourth toe   Plan: I performed a careful debridement of all ten toenails at today's visit. She will be seen back for a recheck in 2-3 months.

## 2014-12-20 ENCOUNTER — Other Ambulatory Visit: Payer: Self-pay | Admitting: Internal Medicine

## 2014-12-20 DIAGNOSIS — M6281 Muscle weakness (generalized): Secondary | ICD-10-CM | POA: Diagnosis not present

## 2014-12-20 DIAGNOSIS — R278 Other lack of coordination: Secondary | ICD-10-CM | POA: Diagnosis not present

## 2014-12-23 ENCOUNTER — Other Ambulatory Visit: Payer: Self-pay | Admitting: Physician Assistant

## 2014-12-23 ENCOUNTER — Other Ambulatory Visit: Payer: Self-pay | Admitting: Internal Medicine

## 2014-12-23 MED ORDER — CITALOPRAM HYDROBROMIDE 20 MG PO TABS: 20.0000 mg | ORAL_TABLET | Freq: Every day | ORAL | Status: DC

## 2014-12-29 ENCOUNTER — Other Ambulatory Visit: Payer: Self-pay | Admitting: *Deleted

## 2015-01-07 ENCOUNTER — Encounter: Payer: Self-pay | Admitting: *Deleted

## 2015-01-13 ENCOUNTER — Other Ambulatory Visit: Payer: Self-pay | Admitting: Physician Assistant

## 2015-01-13 MED ORDER — AMLODIPINE BESYLATE 10 MG PO TABS: 10.0000 mg | ORAL_TABLET | Freq: Every day | ORAL | Status: DC

## 2015-01-13 MED ORDER — CLONAZEPAM 0.5 MG PO TABS: 0.5000 mg | ORAL_TABLET | Freq: Two times a day (BID) | ORAL | Status: DC

## 2015-03-10 ENCOUNTER — Other Ambulatory Visit: Payer: Self-pay | Admitting: Physician Assistant

## 2015-03-10 ENCOUNTER — Ambulatory Visit: Payer: Medicare Other | Admitting: Podiatry

## 2015-03-12 ENCOUNTER — Ambulatory Visit (INDEPENDENT_AMBULATORY_CARE_PROVIDER_SITE_OTHER): Payer: Medicare Other | Admitting: Podiatry

## 2015-03-12 ENCOUNTER — Encounter: Payer: Self-pay | Admitting: Podiatry

## 2015-03-12 DIAGNOSIS — I739 Peripheral vascular disease, unspecified: Secondary | ICD-10-CM

## 2015-03-12 DIAGNOSIS — L608 Other nail disorders: Secondary | ICD-10-CM

## 2015-03-12 DIAGNOSIS — L609 Nail disorder, unspecified: Secondary | ICD-10-CM

## 2015-03-12 NOTE — Patient Instructions (Signed)
Diabetes and Foot Care Diabetes may cause you to have problems because of poor blood supply (circulation) to your feet and legs. This may cause the skin on your feet to become thinner, break easier, and heal more slowly. Your skin may become dry, and the skin may peel and crack. You may also have nerve damage in your legs and feet causing decreased feeling in them. You may not notice minor injuries to your feet that could lead to infections or more serious problems. Taking care of your feet is one of the most important things you can do for yourself.  HOME CARE INSTRUCTIONS  Wear shoes at all times, even in the house. Do not go barefoot. Bare feet are easily injured.  Check your feet daily for blisters, cuts, and redness. If you cannot see the bottom of your feet, use a mirror or ask someone for help.  Wash your feet with warm water (do not use hot water) and mild soap. Then pat your feet and the areas between your toes until they are completely dry. Do not soak your feet as this can dry your skin.  Apply a moisturizing lotion or petroleum jelly (that does not contain alcohol and is unscented) to the skin on your feet and to dry, brittle toenails. Do not apply lotion between your toes.  Trim your toenails straight across. Do not dig under them or around the cuticle. File the edges of your nails with an emery board or nail file.  Do not cut corns or calluses or try to remove them with medicine.  Wear clean socks or stockings every day. Make sure they are not too tight. Do not wear knee-high stockings since they may decrease blood flow to your legs.  Wear shoes that fit properly and have enough cushioning. To break in new shoes, wear them for just a few hours a day. This prevents you from injuring your feet. Always look in your shoes before you put them on to be sure there are no objects inside.  Do not cross your legs. This may decrease the blood flow to your feet.  If you find a minor scrape,  cut, or break in the skin on your feet, keep it and the skin around it clean and dry. These areas may be cleansed with mild soap and water. Do not cleanse the area with peroxide, alcohol, or iodine.  When you remove an adhesive bandage, be sure not to damage the skin around it.  If you have a wound, look at it several times a day to make sure it is healing.  Do not use heating pads or hot water bottles. They may burn your skin. If you have lost feeling in your feet or legs, you may not know it is happening until it is too late.  Make sure your health care provider performs a complete foot exam at least annually or more often if you have foot problems. Report any cuts, sores, or bruises to your health care provider immediately. SEEK MEDICAL CARE IF:   You have an injury that is not healing.  You have cuts or breaks in the skin.  You have an ingrown nail.  You notice redness on your legs or feet.  You feel burning or tingling in your legs or feet.  You have pain or cramps in your legs and feet.  Your legs or feet are numb.  Your feet always feel cold. SEEK IMMEDIATE MEDICAL CARE IF:   There is increasing redness,   swelling, or pain in or around a wound.  There is a red line that goes up your leg.  Pus is coming from a wound.  You develop a fever or as directed by your health care provider.  You notice a bad smell coming from an ulcer or wound. Document Released: 09/03/2000 Document Revised: 05/09/2013 Document Reviewed: 02/13/2013 [employer] Patient Information 2015 [employer]. This information is not intended to replace advice given to you by your health care provider. Make sure you discuss any questions you have with your health care provider.  

## 2015-03-13 NOTE — Progress Notes (Signed)
Patient ID: [name], female   DOB: March 18, 1946, 69 y.o.   MRN: [id number]  Subjective: This patient presents for scheduled visit for debridement of toenails.  Objective: The toenails are elongated and incurvated with normal texture 6-10  Assessment: Incurvated toenails 6-10 History of peripheral arterial disease under management of vascular surgeon from imaging dated 04/26/2014 Type II diabetic  Plan: Debridement of incurvated toenails 10 without any bleeding  Reappoint 3 months

## 2015-03-17 ENCOUNTER — Other Ambulatory Visit: Payer: Self-pay | Admitting: Physician Assistant

## 2015-03-17 ENCOUNTER — Other Ambulatory Visit: Payer: Self-pay

## 2015-03-26 ENCOUNTER — Ambulatory Visit (INDEPENDENT_AMBULATORY_CARE_PROVIDER_SITE_OTHER): Payer: Medicare Other | Admitting: Physician Assistant

## 2015-03-26 ENCOUNTER — Other Ambulatory Visit: Payer: Self-pay

## 2015-03-26 ENCOUNTER — Encounter: Payer: Self-pay | Admitting: Physician Assistant

## 2015-03-26 VITALS — BP 142/80 | HR 72 | Temp 97.7°F | Resp 16 | Ht 66.0 in | Wt 161.0 lb

## 2015-03-26 DIAGNOSIS — E1129 Type 2 diabetes mellitus with other diabetic kidney complication: Secondary | ICD-10-CM | POA: Diagnosis not present

## 2015-03-26 DIAGNOSIS — E1169 Type 2 diabetes mellitus with other specified complication: Secondary | ICD-10-CM | POA: Diagnosis not present

## 2015-03-26 DIAGNOSIS — K219 Gastro-esophageal reflux disease without esophagitis: Secondary | ICD-10-CM

## 2015-03-26 DIAGNOSIS — Z720 Tobacco use: Secondary | ICD-10-CM

## 2015-03-26 DIAGNOSIS — N183 Chronic kidney disease, stage 3 (moderate): Secondary | ICD-10-CM

## 2015-03-26 DIAGNOSIS — Z860100 Personal history of colon polyps, unspecified: Secondary | ICD-10-CM

## 2015-03-26 DIAGNOSIS — I10 Essential (primary) hypertension: Secondary | ICD-10-CM

## 2015-03-26 DIAGNOSIS — Z23 Encounter for immunization: Secondary | ICD-10-CM

## 2015-03-26 DIAGNOSIS — E785 Hyperlipidemia, unspecified: Secondary | ICD-10-CM | POA: Diagnosis not present

## 2015-03-26 DIAGNOSIS — I70219 Atherosclerosis of native arteries of extremities with intermittent claudication, unspecified extremity: Secondary | ICD-10-CM

## 2015-03-26 DIAGNOSIS — E1122 Type 2 diabetes mellitus with diabetic chronic kidney disease: Secondary | ICD-10-CM | POA: Diagnosis not present

## 2015-03-26 DIAGNOSIS — R911 Solitary pulmonary nodule: Secondary | ICD-10-CM

## 2015-03-26 DIAGNOSIS — I639 Cerebral infarction, unspecified: Secondary | ICD-10-CM

## 2015-03-26 DIAGNOSIS — Z0001 Encounter for general adult medical examination with abnormal findings: Secondary | ICD-10-CM

## 2015-03-26 DIAGNOSIS — Z6826 Body mass index (BMI) 26.0-26.9, adult: Secondary | ICD-10-CM

## 2015-03-26 DIAGNOSIS — H409 Unspecified glaucoma: Secondary | ICD-10-CM

## 2015-03-26 DIAGNOSIS — K222 Esophageal obstruction: Secondary | ICD-10-CM

## 2015-03-26 DIAGNOSIS — R6889 Other general symptoms and signs: Secondary | ICD-10-CM | POA: Diagnosis not present

## 2015-03-26 DIAGNOSIS — Z1159 Encounter for screening for other viral diseases: Secondary | ICD-10-CM | POA: Diagnosis not present

## 2015-03-26 DIAGNOSIS — F411 Generalized anxiety disorder: Secondary | ICD-10-CM

## 2015-03-26 DIAGNOSIS — E559 Vitamin D deficiency, unspecified: Secondary | ICD-10-CM | POA: Diagnosis not present

## 2015-03-26 DIAGNOSIS — K5730 Diverticulosis of large intestine without perforation or abscess without bleeding: Secondary | ICD-10-CM

## 2015-03-26 DIAGNOSIS — E042 Nontoxic multinodular goiter: Secondary | ICD-10-CM

## 2015-03-26 DIAGNOSIS — Z79899 Other long term (current) drug therapy: Secondary | ICD-10-CM | POA: Diagnosis not present

## 2015-03-26 DIAGNOSIS — I671 Cerebral aneurysm, nonruptured: Secondary | ICD-10-CM

## 2015-03-26 DIAGNOSIS — F3341 Major depressive disorder, recurrent, in partial remission: Secondary | ICD-10-CM | POA: Insufficient documentation

## 2015-03-26 DIAGNOSIS — I739 Peripheral vascular disease, unspecified: Secondary | ICD-10-CM

## 2015-03-26 DIAGNOSIS — F329 Major depressive disorder, single episode, unspecified: Secondary | ICD-10-CM

## 2015-03-26 DIAGNOSIS — Z789 Other specified health status: Secondary | ICD-10-CM

## 2015-03-26 DIAGNOSIS — F32A Depression, unspecified: Secondary | ICD-10-CM

## 2015-03-26 LAB — CBC WITH DIFFERENTIAL/PLATELET
Basophils Absolute: 0.1 10*3/uL (ref 0.0–0.1)
Basophils Relative: 1 % (ref 0–1)
EOS PCT: 1 % (ref 0–5)
Eosinophils Absolute: 0.1 10*3/uL (ref 0.0–0.7)
HCT: 41.6 % (ref 36.0–46.0)
Hemoglobin: 13.5 g/dL (ref 12.0–15.0)
LYMPHS PCT: 31 % (ref 12–46)
Lymphs Abs: 3 10*3/uL (ref 0.7–4.0)
MCH: 29.9 pg (ref 26.0–34.0)
MCHC: 32.5 g/dL (ref 30.0–36.0)
MCV: 92 fL (ref 78.0–100.0)
MONO ABS: 0.7 10*3/uL (ref 0.1–1.0)
MPV: 10.2 fL (ref 8.6–12.4)
Monocytes Relative: 7 % (ref 3–12)
Neutro Abs: 5.8 10*3/uL (ref 1.7–7.7)
Neutrophils Relative %: 60 % (ref 43–77)
PLATELETS: 357 10*3/uL (ref 150–400)
RBC: 4.52 MIL/uL (ref 3.87–5.11)
RDW: 13.3 % (ref 11.5–15.5)
WBC: 9.7 10*3/uL (ref 4.0–10.5)

## 2015-03-26 LAB — HEMOGLOBIN A1C
Hgb A1c MFr Bld: 9.8 % — ABNORMAL HIGH (ref <5.7)
MEAN PLASMA GLUCOSE: 235 mg/dL — AB (ref <117)

## 2015-03-26 MED ORDER — GLUCOSE BLOOD VI STRP: ORAL_STRIP | Status: DC

## 2015-03-26 MED ORDER — CLONAZEPAM 0.5 MG PO TABS: 0.5000 mg | ORAL_TABLET | Freq: Two times a day (BID) | ORAL | Status: DC

## 2015-03-26 MED ORDER — ACCU-CHEK AVIVA PLUS W/DEVICE KIT: PACK | Status: DC

## 2015-03-26 MED ORDER — SITAGLIPTIN PHOSPHATE 100 MG PO TABS: ORAL_TABLET | ORAL | Status: DC

## 2015-03-26 MED ORDER — INSULIN PEN NEEDLE 32G X 6 MM MISC: Status: DC

## 2015-03-26 MED ORDER — CITALOPRAM HYDROBROMIDE 20 MG PO TABS: ORAL_TABLET | ORAL | Status: DC

## 2015-03-26 NOTE — Progress Notes (Signed)
Medicare Wellness and 3 month OVERDUE OV  Assessment:   1. Essential hypertension - continue medications, DASH diet, exercise and monitor at home. Call if greater than 130/80.  - CBC with Differential/Platelet - BASIC METABOLIC PANEL WITH GFR - Hepatic function panel - TSH  2. CKD stage 3 due to type 2 diabetes mellitus Discussed general issues about diabetes pathophysiology and management., Educational material distributed., Suggested low cholesterol diet., Encouraged aerobic exercise., Discussed foot care., Reminded to get yearly retinal exam. Sent in meter for Accucheck - Insulin, fasting - Hemoglobin A1c - HM DIABETES FOOT EXAM  3. Hyperlipidemia associated with type 2 diabetes mellitus -continue medications, check lipids, decrease fatty foods, increase activity.  Goal less than 70 - Lipid panel  4. Peripheral vascular disease Control blood pressure, cholesterol, glucose, increase exercise.  Follows up with Dr. Melton Alar this week Continue ASA Advised to stop smoking Can do PT again if needed  5. Vitamin D deficiency - Vit D  25 hydroxy (rtn osteoporosis monitoring)  6. Medication management - Magnesium  7. CEREBRAL ANEURYSM Control blood pressure, cholesterol, glucose, increase exercise.  Follows up with Dr. Melton Alar this week Continue ASA Advised to stop smoking  8. Atherosclerosis of native arteries of extremity with intermittent claudication Control blood pressure, cholesterol, glucose, increase exercise.  Continue ASA Advised to stop smoking  9. Cerebral infarction due to unspecified mechanism Control blood pressure, cholesterol, glucose, increase exercise.  Follows up with Dr. Melton Alar this week Continue ASA Advised to stop smoking  10. Glaucoma Continue eye doctor.   11. Generalized anxiety disorder Continue klonopin as needed  12. Depression Continue celexa, discussed increasing to 40 mg, decline at this time  13. Tobacco abuse - CT CHEST NODULE  FOLLOW UP LOW DOSE W/O; Future  14. History of colonic polyps Follow up Dr. Henrene Pastor   15. Diverticulosis of large intestine without hemorrhage Follow up Dr. Henrene Pastor  16. ESOPHAGEAL STRICTURE better  17. Gastroesophageal reflux disease without esophagitis Continue PPI/H2 blocker, diet discussed  18. Nontoxic multinodular goiter monitor  19. Lung nodule Stop smoking - CT CHEST NODULE FOLLOW UP LOW DOSE W/O; Future  20. Need for prophylactic vaccination with combined diphtheria-tetanus-pertussis (DTP) vaccine - Dt vaccine greater than 7yo IM  21. Screening for viral disease - Hepatitis C antibody   Plan:   During the course of the visit the patient was educated and counseled about appropriate screening and preventive services including:    Pneumococcal vaccine   Influenza vaccine  Td vaccine  Screening electrocardiogram  Screening mammography  Screening Pap smear and pelvic exam   Bone densitometry screening  Colorectal cancer screening  Diabetes screening  Glaucoma screening  Nutrition counseling   Smoking cessation counseling  Conditions/risks identified: BMI: Discussed weight loss, diet, and increase physical activity.  Increase physical activity: AHA recommends 150 minutes of physical activity a week.  Medications reviewed DEXA- ordered Diabetes is not at goal, ACE/ARB therapy: Yes. Urinary Incontinence is not an issue: discussed non pharmacology and pharmacology options.  Fall risk: low- discussed PT, home fall assessment, medications.    Subjective:   [name] is a 69 y.o. female who presents for Medicare Annual Wellness Visit and 3 month follow up on hypertension, diabetes, hyperlipidemia, vitamin D def.  Date of last medicare wellness visit was 02/08/2014  Her blood pressure has been controlled at home, she is on norvasc 59m and losartan 1024m states BP was up due to stress/running around, today their BP is BP: (!) 142/80 mmHg She  does  workout, did PT and states that she has continued to walk/exercise and is feeling better. She denies chest pain, shortness of breath, dizziness.  She is on cholesterol medication, simvastatin 24m and denies myalgias. Her cholesterol is not at goal. The cholesterol last visit was:   Lab Results  Component Value Date   CHOL 139 10/05/2014   HDL 26* 10/05/2014   LDLCALC 49 10/05/2014   LDLDIRECT 146.4 01/30/2010   TRIG 321* 10/05/2014   CHOLHDL 5.3 10/05/2014   She has several comorbidities from her DM including neuropathy, nephropathy, and PAD, she been working on diet and exercise for diabetes, her meter has been broken for 2 weeks so she has not been checking her sugars, and denies polydipsia, polyuria and visual disturbances. She is on ASA 325 and ARB. She is on MF, Januvia, Novolog mix 70/30 she is on 12-15 units depending on her number and 10 units at night before bed without food around 1030 at night and Last A1C in the office was:  Lab Results  Component Value Date   HGBA1C 8.7* 10/04/2014  She was taken off invokana and HCTZ due to dizziness/CKD.  She had a TIA in Jan of this year, has been following with Dr. LCatalina Gravel  Patient is on Vitamin D. Has been under a lot of stress, has had several deaths in her church and her nephew died from cancer, and her daughter NSherron Flemingsis stressing her out, she is very depressed, trying to help with her grandkids because she is just going to her room and not doing anything.   Names of Other Physician/Practitioners you currently use: 1. [facility]- here for primary care 2. Dr. HHerbert Deaner eye doctor, last visit 10/2014 Patient Care Team: WUnk Pinto MD as PCP - General (Internal Medicine) EMichel Santee MD as Consulting Physician (Neurology) RGean Birchwood DPM as Consulting Physician (Podiatry) VSerafina Mitchell MD as Consulting Physician (Vascular Surgery) JMinus Breeding MD as Consulting Physician  (Cardiology) KMonna Fam MD as Consulting Physician (Ophthalmology) JIrene Shipper MD as Consulting Physician (Gastroenterology)  Medication Review Current Outpatient Prescriptions on File Prior to Visit  Medication Sig Dispense Refill  . amLODipine (NORVASC) 10 MG tablet Take 1 tablet (10 mg total) by mouth daily. 90 tablet 0  . aspirin 325 MG tablet Take 325 mg by mouth daily.     . Blood Glucose Monitoring Suppl (ACCU-CHEK ACTIVE CARE KIT) KIT 1 Device by Does not apply route 3 (three) times daily. 1 each 0  . Cholecalciferol (VITAMIN D3) 5000 UNITS TABS Take 1 capsule by mouth daily.    .Marland KitchenCINNAMON PO Take 1,000 mg by mouth 2 (two) times daily.     . citalopram (CELEXA) 20 MG tablet TAKE 1 TABLET (20 MG TOTAL) BY MOUTH DAILY. 90 tablet 0  . clonazePAM (KLONOPIN) 0.5 MG tablet Take 1 tablet (0.5 mg total) by mouth 2 (two) times daily. 60 tablet 0  . glucose blood test strip 1 each by Other route 3 (three) times daily. Dispense 3 month supply Accu Check testing strips 3 boxes of 100    . Insulin Aspart Prot & Aspart (NOVOLOG 70/30 MIX) (70-30) 100 UNIT/ML Pen Take up to 15 units  2 x daily by sliding scale as directed for Diabetes (Patient taking differently: Inject 10-12 Units into the skin 2 (two) times daily. Take 12 units in the morning and 10 units at bedtime adjust with sliding scale as directed for Diabetes) 30 mL 90  .  IRON PO Take by mouth daily.    Marland Kitchen losartan (COZAAR) 100 MG tablet Take 1 tablet (100 mg total) by mouth daily. 30 tablet 11  . metFORMIN (GLUCOPHAGE-XR) 500 MG 24 hr tablet TAKE 2 TABLETS BY MOUTH TWICE A DAY 360 tablet 4  . NOVOFINE 32G X 6 MM MISC USE AS DIRECTED 4 TIMES A DAY 4 each prn  . Omega-3 Fatty Acids (FISH OIL) 1200 MG CAPS Take 1 capsule by mouth daily.    Marland Kitchen omeprazole (PRILOSEC) 40 MG capsule TAKE ONE CAPSULE BY MOUTH EVERY DAY 90 capsule 3  . simvastatin (ZOCOR) 20 MG tablet Take 1 tablet (20 mg total) by mouth daily. 90 tablet 1  . UNABLE TO FIND  Outpatient OT 1 each 0  . vitamin E (VITAMIN E) 400 UNIT capsule Take 400 Units by mouth daily.    Marland Kitchen zinc gluconate 50 MG tablet Take 50 mg by mouth daily.     No current facility-administered medications on file prior to visit.    Current Problems (verified) Patient Active Problem List   Diagnosis Date Noted  . Aftercare following surgery of the circulatory system 11/11/2014  . CVA (cerebral infarction) 10/04/2014  . Tobacco abuse 10/04/2014  . 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 Pain of left lower leg-Right 04/26/2014  . Vitamin D deficiency 11/02/2013  . Medication management 11/02/2013  . Peripheral vascular disease 05/08/2012  . Atherosclerosis of native arteries of extremity with intermittent claudication 12/24/2011  . DIVERTICULOSIS-COLON 06/09/2010  . Personal history of colonic polyps 06/09/2010  . TOBACCO USE, QUIT 03/27/2010  . ESOPHAGEAL STRICTURE 08/27/2008  . Esophageal reflux 08/27/2008  . Nontoxic multinodular goiter 03/11/2008  . Glaucoma 03/11/2008  . CEREBRAL ANEURYSM 03/11/2008  . Hyperlipidemia associated with type 2 diabetes mellitus 08/02/2007  . CKD stage 3 due to type 2 diabetes mellitus 05/01/2007  . Essential hypertension 05/01/2007    Screening Tests Health Maintenance  Topic Date Due  . COLONOSCOPY  10/23/1996  . ZOSTAVAX  10/23/2006  . DEXA SCAN  10/24/2011  . TETANUS/TDAP  09/21/2014  . URINE MICROALBUMIN  11/02/2014  . FOOT EXAM  02/09/2015  . PNA vac Low Risk Adult (2 of 2 - PCV13) 02/09/2015  . HEMOGLOBIN A1C  04/04/2015  . INFLUENZA VACCINE  04/21/2015  . OPHTHALMOLOGY EXAM  12/09/2015  . MAMMOGRAM  04/30/2016     Immunization History  Administered Date(s) Administered  . Influenza Whole 08/02/2007, 06/18/2009, 07/09/2013  . Pneumococcal Polysaccharide-23 02/08/2014  . Td 09/21/2004    Preventative care: Last colonoscopy: 2011 Dr. Henrene Pastor due 10 years Last mammogram: 04/2014 Last pap smear/pelvic exam: 2007  DEXA:  declines Echo 2016 ABI 2014 MRI brain 09/2014 Ct chest WO contrast 10/2014- 5 mm LLL nodule- needs repeat 04/2015  Prior vaccinations: TD or Tdap: 2006 DUE  Influenza: 2014  Pneumococcal: 2015 Prevnar 13: DUE out of in the office Shingles/Zostavax: Check price  Allergies Allergies  Allergen Reactions  . Plavix [Clopidogrel Bisulfate] Palpitations  . Amoxicillin Itching and Swelling    FACE & EYES SWELL  . Ace Inhibitors   . Lyrica [Pregabalin]   . Penicillins Other (See Comments)    REACTION: unspecified   Surgical history Past Surgical History  Procedure Laterality Date  . Cesarean section    . Cholecystectomy    . Knee surgery    . Rotator cuff surgery    . Thyroid surgery    . Hernia repair      times two  . Abdominal hysterectomy  1984  .  Femoral artery stent  05/11/11    Left superficial femoral and popliteal artery  . Dental surgery  Aug. 16, 2013    left lower   . Femoral-popliteal bypass graft  Nov. 26, 2013    Left Angiogram-   . Eye surgery  Nov. 2014    Laser-Glaucoma  . Esophagogastroduodenoscopy  07/2010  . Colonoscopy  07/2010  . Abdominal aortagram N/A 01/04/2012    Procedure: ABDOMINAL Maxcine Ham;  Surgeon: Serafina Mitchell, MD;  Location: [facility] CATH LAB;  Service: Cardiovascular;  Laterality: N/A;  . Abdominal aortagram N/A 08/15/2012    Procedure: ABDOMINAL AORTAGRAM;  Surgeon: Serafina Mitchell, MD;  Location: [facility] CATH LAB;  Service: Cardiovascular;  Laterality: N/A;  . Lower extremity angiogram Left 08/15/2012    Procedure: LOWER EXTREMITY ANGIOGRAM;  Surgeon: Serafina Mitchell, MD;  Location: [facility] CATH LAB;  Service: Cardiovascular;  Laterality: Left;  lt leg angio   Family history Family History  Problem Relation Age of Onset  . CAD Mother 49    Died of MI  . Hypertension Mother   . Heart attack Mother   . Heart disease Mother   . CAD Father 11    Died of MI  . Heart disease Father   . Heart attack Father   . CAD Brother 53    Two brothers  died of MI  . Heart attack Brother   . Heart disease Brother     Amputation  . Diabetes Sister   . Hypertension Sister   . Heart attack Brother   . Heart disease Brother    Risk Factors: Osteoporosis: postmenopausal estrogen deficiency and dietary calcium and/or vitamin D deficiency History of fracture in the past year: no  Tobacco History  Substance Use Topics  . Smoking status: Light Tobacco Smoker -- 0.25 packs/day for 40 years    Types: Cigarettes  . Smokeless tobacco: Never Used     Comment: pt states that she will try to smoke without any aid  . Alcohol Use: No   She does smoke.   Are there smokers in your home (other than you)?  No  Alcohol Current alcohol use: none  Caffeine Current caffeine use: coffee 1 /day  Exercise Current exercise: no regular exercise  Nutrition/Diet Current diet: in general, a "healthy" diet    Cardiac risk factors: advanced age (older than 39 for men, 48 for women), diabetes mellitus, dyslipidemia, family history of premature cardiovascular disease, hypertension, sedentary lifestyle and smoking/ tobacco exposure.  Depression Screen  Q1: Over the past two weeks, have you felt down, depressed or hopeless? No  Q2: Over the past two weeks, have you felt little interest or pleasure in doing things? No  Have you lost interest or pleasure in daily life? No  Do you often feel hopeless? No  Do you cry easily over simple problems? No  Activities of Daily Living In your present state of health, do you have any difficulty performing the following activities?:  Driving? No Managing money?  No Feeding yourself? No Getting from bed to chair? No Climbing a flight of stairs? No Preparing food and eating?: No Bathing or showering? No Getting dressed: No Getting to the toilet? No Using the toilet:No Moving around from place to place: No In the past year have you fallen or had a near fall?:No   Are you sexually active?  No  Do you have more  than one partner?  No  Vision Difficulties: No  Hearing Difficulties: No Do you  often ask people to speak up or repeat themselves? No Do you experience ringing or noises in your ears? No Do you have difficulty understanding soft or whispered voices? No  Cognition  Do you feel that you have a problem with memory?No  Do you often misplace items? No  Do you feel safe at home?  Yes  Advanced directives Does patient have a Homeworth? Yes Does patient have a Living Will? Yes   Objective:   Blood pressure 142/80, pulse 72, temperature 97.7 F (36.5 C), resp. rate 16, height _0  (1.676 m), weight 161 lb (73.029 kg). Body mass index is 26 kg/(m^2).  General appearance: alert, no distress, WD/WN,  female Cognitive Testing  Alert? Yes  Normal Appearance?Yes  Oriented to person? Yes  Place? Yes   Time? Yes  Recall of three objects?  Yes  Can perform simple calculations? Yes  Displays appropriate judgment?Yes  Can read the correct time from a watch face?Yes  HEENT: normocephalic, sclerae anicteric, TMs pearly, nares patent, no discharge or erythema, pharynx normal. Oral cavity: MMM, no lesions Neck: supple, no lymphadenopathy, no thyromegaly, no masses Heart: RRR, normal S1, S2, no murmurs Lungs: CTA bilaterally, no wheezes, rhonchi, or rales Abdomen: +bs, soft, non tender, non distended, no masses, no hepatomegaly, no splenomegaly Musculoskeletal: nontender, no swelling, no obvious deformity Extremities: no edema, no cyanosis, no clubbing Pulses: 1+ symmetric, upper and lower extremities, normal cap refill Neurological: alert, oriented x 3, CN2-12 intact, strength normal upper extremities and lower extremities, sensation slightly decreased throughout bottom of bilateral feet, DTRs 2+ throughout, no cerebellar signs, gait normal Psychiatric: normal affect, behavior normal, pleasant  Breast: defer Gyn: defer Rectal: defer  Medicare Attestation I have  personally reviewed: The patient's medical and social history Their use of alcohol, tobacco or illicit drugs Their current medications and supplements The patient's functional ability including ADLs,fall risks, home safety risks, cognitive, and hearing and visual impairment Diet and physical activities Evidence for depression or mood disorders  The patient's weight, height, BMI, and visual acuity have been recorded in the chart.  I have made referrals, counseling, and provided education to the patient based on review of the above and I have provided the patient with a written personalized care plan for preventive services.     [name], PA-C   03/26/2015

## 2015-03-26 NOTE — Patient Instructions (Addendum)
Dr. Henrene Pastor, call for appointment  Phone: [phone];    Your A1C is a measure of your sugar over the past 3 months and is not affected by what you have eaten over the past few days. Diabetes increases your chances of stroke and heart attack over 300 % and is the leading cause of blindness and kidney failure in the [place]. Please make sure you decrease bad carbs like white bread, white rice, potatoes, corn, soft drinks, pasta, cereals, refined sugars, sweet tea, dried fruits, and fruit juice. Good carbs are okay to eat in moderation like sweet potatoes, brown rice, whole grain pasta/bread, most fruit (except dried fruit) and you can eat as many veggies as you want.   Greater than 6.5 is considered diabetic. Between 6.4 and 5.7 is prediabetic If your A1C is less than 5.7 you are NOT diabetic.  Targets for Glucose Readings: Time of Check Target for patients WITHOUT Diabetes Target for DIABETICS  Before Meals Less than 100  less than 150  Two hours after meals Less than 200  Less than 250    Somogyi effect The brain needs two things: oxygen and sugar. If the blood sugar level drops too low in the early morning hours, hormones (such as growth hormone, cortisol, and catecholamines) are released to make sure you brain can still function. These help reverse the low blood sugar level but may lead to blood sugar levels that are higher than normal in the morning. This is common for patient that take insulin at night or do not ear regular snacks.  Please schedule to get up in the middle of the night to check your blood sugar.   This may be happening to you. Please eat a high protein night time snack and we will be decreasing your night time insulin as follows:   Diabetes is a very complicated disease...lets simplify it.  An easy way to look at it to understand the complications is if you think of the extra sugar floating in your blood stream as glass shards floating through your blood stream.     Diabetes affects your small vessels first: 1) The glass shards (sugar) scraps down the tiny blood vessels in your eyes and lead to diabetic retinopathy, the leading cause of blindness in the [place]. Diabetes is the leading cause of newly diagnosed adult (69 to 68 years of age) blindness in the [place].  2) The glass shards scratches down the tiny vessels of your legs leading to nerve damage called neuropathy and can lead to amputations of your feet. More than 60% of all non-traumatic amputations of lower limbs occur in people with diabetes.  3) Over time the small vessels in your brain are shredded and closed off, individually this does not cause any problems but over a long period of time many of the small vessels being blocked can lead to Vascular Dementia.   4) Your kidney's are a filter system and have a "net" that keeps certain things in the body and lets bad things out. Sugar shreds this net and leads to kidney damage and eventually failure. Decreasing the sugar that is destroying the net and certain blood pressure medications can help stop or decrease progression of kidney disease. Diabetes was the primary cause of kidney failure in 44 percent of all new cases in 2011.  5) Diabetes also destroys the small vessels in your penis that lead to erectile dysfunction. Eventually the vessels are so damaged that you may not be responsive to  cialis or viagra.   Diabetes and your large vessels: Your larger vessels consist of your coronary arteries in your heart and the carotid vessels to your brain. Diabetes or even increased sugars put you at 300% increased risk of heart attack and stroke and this is why.. The sugar scrapes down your large blood vessels and your body sees this as an internal injury and tries to repair itself. Just like you get a scab on your skin, your platelets will stick to the blood vessel wall trying to heal it. This is why we have diabetics on low dose aspirin daily, this  prevents the platelets from sticking and can prevent plaque formation. In addition, your body takes cholesterol and tries to shove it into the open wound. This is why we want your LDL, or bad cholesterol, below 70.   The combination of platelets and cholesterol over 5-10 years forms plaque that can break off and cause a heart attack or stroke.   PLEASE REMEMBER:  Diabetes is preventable! Up to 17 percent of complications and morbidities among individuals with type 2 diabetes can be prevented, delayed, or effectively treated and minimized with regular visits to a health professional, appropriate monitoring and medication, and a healthy diet and lifestyle.     Bad carbs also include fruit juice, alcohol, and sweet tea. These are empty calories that do not signal to your brain that you are full.   Please remember the good carbs are still carbs which convert into sugar. So please measure them out no more than 1/2-1 cup of rice, oatmeal, pasta, and beans  Veggies are however free foods! Pile them on.   Not all fruit is created equal. Please see the list below, the fruit at the bottom is higher in sugars than the fruit at the top. Please avoid all dried fruits.      Needs Prevnar 13 shot

## 2015-03-27 LAB — INSULIN, FASTING: INSULIN FASTING, SERUM: 14.7 u[IU]/mL (ref 2.0–19.6)

## 2015-03-27 LAB — BASIC METABOLIC PANEL WITH GFR
BUN: 19 mg/dL (ref 6–23)
CO2: 27 mEq/L (ref 19–32)
CREATININE: 0.84 mg/dL (ref 0.50–1.10)
Calcium: 9.6 mg/dL (ref 8.4–10.5)
Chloride: 101 mEq/L (ref 96–112)
GFR, EST AFRICAN AMERICAN: 83 mL/min
GFR, Est Non African American: 72 mL/min
Glucose, Bld: 277 mg/dL — ABNORMAL HIGH (ref 70–99)
Potassium: 4.3 mEq/L (ref 3.5–5.3)
Sodium: 139 mEq/L (ref 135–145)

## 2015-03-27 LAB — LIPID PANEL
Cholesterol: 154 mg/dL (ref 0–200)
HDL: 48 mg/dL (ref >=46)
LDL Cholesterol: 82 mg/dL (ref 0–99)
Total CHOL/HDL Ratio: 3.2 Ratio
Triglycerides: 122 mg/dL (ref <150)
VLDL: 24 mg/dL (ref 0–40)

## 2015-03-27 LAB — VITAMIN D 25 HYDROXY (VIT D DEFICIENCY, FRACTURES): VIT D 25 HYDROXY: 33 ng/mL (ref 30–100)

## 2015-03-27 LAB — HEPATIC FUNCTION PANEL
ALK PHOS: 99 U/L (ref 39–117)
ALT: 14 U/L (ref 0–35)
AST: 17 U/L (ref 0–37)
Albumin: 4 g/dL (ref 3.5–5.2)
BILIRUBIN TOTAL: 0.4 mg/dL (ref 0.2–1.2)
Bilirubin, Direct: 0.1 mg/dL (ref 0.0–0.3)
Indirect Bilirubin: 0.3 mg/dL (ref 0.2–1.2)
Total Protein: 7.1 g/dL (ref 6.0–8.3)

## 2015-03-27 LAB — HEPATITIS C ANTIBODY: HCV AB: NEGATIVE

## 2015-03-27 LAB — TSH: TSH: 0.837 u[IU]/mL (ref 0.350–4.500)

## 2015-03-27 LAB — MAGNESIUM: MAGNESIUM: 1.9 mg/dL (ref 1.5–2.5)

## 2015-03-28 DIAGNOSIS — E1140 Type 2 diabetes mellitus with diabetic neuropathy, unspecified: Secondary | ICD-10-CM | POA: Diagnosis not present

## 2015-03-28 DIAGNOSIS — I671 Cerebral aneurysm, nonruptured: Secondary | ICD-10-CM | POA: Diagnosis not present

## 2015-03-28 DIAGNOSIS — I6523 Occlusion and stenosis of bilateral carotid arteries: Secondary | ICD-10-CM | POA: Diagnosis not present

## 2015-03-28 DIAGNOSIS — I672 Cerebral atherosclerosis: Secondary | ICD-10-CM | POA: Diagnosis not present

## 2015-03-28 DIAGNOSIS — G43109 Migraine with aura, not intractable, without status migrainosus: Secondary | ICD-10-CM | POA: Diagnosis not present

## 2015-03-28 DIAGNOSIS — F4321 Adjustment disorder with depressed mood: Secondary | ICD-10-CM | POA: Diagnosis not present

## 2015-03-31 ENCOUNTER — Other Ambulatory Visit: Payer: Self-pay | Admitting: Neurology

## 2015-03-31 DIAGNOSIS — R0989 Other specified symptoms and signs involving the circulatory and respiratory systems: Secondary | ICD-10-CM

## 2015-03-31 DIAGNOSIS — I6523 Occlusion and stenosis of bilateral carotid arteries: Secondary | ICD-10-CM

## 2015-04-04 ENCOUNTER — Ambulatory Visit
Admission: RE | Admit: 2015-04-04 | Discharge: 2015-04-04 | Disposition: A | Discharge status: Home / Self Care | Payer: Medicare Other | Source: Ambulatory Visit | Attending: Neurology | Admitting: Neurology

## 2015-04-04 DIAGNOSIS — I6523 Occlusion and stenosis of bilateral carotid arteries: Secondary | ICD-10-CM

## 2015-04-04 DIAGNOSIS — I6503 Occlusion and stenosis of bilateral vertebral arteries: Secondary | ICD-10-CM | POA: Diagnosis not present

## 2015-04-04 DIAGNOSIS — R0989 Other specified symptoms and signs involving the circulatory and respiratory systems: Secondary | ICD-10-CM

## 2015-04-04 MED ORDER — IOPAMIDOL (ISOVUE-370) INJECTION 76%
75.0000 mL | Freq: Once | INTRAVENOUS | Status: AC | PRN
  Administered 2015-04-04: 75 mL via INTRAVENOUS

## 2015-04-07 ENCOUNTER — Other Ambulatory Visit: Payer: Self-pay | Admitting: Physician Assistant

## 2015-04-25 DIAGNOSIS — E118 Type 2 diabetes mellitus with unspecified complications: Secondary | ICD-10-CM | POA: Diagnosis not present

## 2015-04-25 DIAGNOSIS — H40023 Open angle with borderline findings, high risk, bilateral: Secondary | ICD-10-CM | POA: Diagnosis not present

## 2015-04-25 DIAGNOSIS — H524 Presbyopia: Secondary | ICD-10-CM | POA: Diagnosis not present

## 2015-04-25 DIAGNOSIS — E11319 Type 2 diabetes mellitus with unspecified diabetic retinopathy without macular edema: Secondary | ICD-10-CM | POA: Diagnosis not present

## 2015-04-29 ENCOUNTER — Other Ambulatory Visit: Payer: Self-pay | Admitting: Physician Assistant

## 2015-04-29 DIAGNOSIS — Z720 Tobacco use: Secondary | ICD-10-CM

## 2015-04-29 DIAGNOSIS — R911 Solitary pulmonary nodule: Secondary | ICD-10-CM

## 2015-04-30 ENCOUNTER — Other Ambulatory Visit: Payer: Self-pay | Admitting: Physician Assistant

## 2015-04-30 DIAGNOSIS — E2839 Other primary ovarian failure: Secondary | ICD-10-CM

## 2015-05-02 DIAGNOSIS — Z1289 Encounter for screening for malignant neoplasm of other sites: Secondary | ICD-10-CM | POA: Diagnosis not present

## 2015-05-02 DIAGNOSIS — Z1231 Encounter for screening mammogram for malignant neoplasm of breast: Secondary | ICD-10-CM | POA: Diagnosis not present

## 2015-05-09 ENCOUNTER — Other Ambulatory Visit: Payer: Self-pay | Admitting: Internal Medicine

## 2015-05-09 DIAGNOSIS — E1122 Type 2 diabetes mellitus with diabetic chronic kidney disease: Secondary | ICD-10-CM

## 2015-05-12 ENCOUNTER — Other Ambulatory Visit: Payer: Self-pay | Admitting: Physician Assistant

## 2015-05-15 ENCOUNTER — Other Ambulatory Visit: Payer: Self-pay | Admitting: Physician Assistant

## 2015-05-15 DIAGNOSIS — Z720 Tobacco use: Secondary | ICD-10-CM

## 2015-05-15 DIAGNOSIS — R911 Solitary pulmonary nodule: Secondary | ICD-10-CM

## 2015-05-16 ENCOUNTER — Ambulatory Visit
Admission: RE | Admit: 2015-05-16 | Discharge: 2015-05-16 | Disposition: A | Discharge status: Home / Self Care | Payer: Medicare Other | Source: Ambulatory Visit | Attending: Physician Assistant | Admitting: Physician Assistant

## 2015-05-16 DIAGNOSIS — Z720 Tobacco use: Secondary | ICD-10-CM

## 2015-05-16 DIAGNOSIS — R911 Solitary pulmonary nodule: Secondary | ICD-10-CM

## 2015-05-16 DIAGNOSIS — E2839 Other primary ovarian failure: Secondary | ICD-10-CM

## 2015-05-16 DIAGNOSIS — Z780 Asymptomatic menopausal state: Secondary | ICD-10-CM | POA: Diagnosis not present

## 2015-05-21 ENCOUNTER — Other Ambulatory Visit: Payer: Self-pay | Admitting: Internal Medicine

## 2015-06-07 ENCOUNTER — Other Ambulatory Visit: Payer: Self-pay | Admitting: Physician Assistant

## 2015-06-11 ENCOUNTER — Ambulatory Visit: Payer: Medicare Other | Admitting: Podiatry

## 2015-06-24 ENCOUNTER — Encounter: Payer: Self-pay | Admitting: Physician Assistant

## 2015-06-24 DIAGNOSIS — Z0000 Encounter for general adult medical examination without abnormal findings: Secondary | ICD-10-CM | POA: Insufficient documentation

## 2015-06-27 ENCOUNTER — Ambulatory Visit: Payer: Self-pay | Admitting: Physician Assistant

## 2015-06-28 ENCOUNTER — Encounter: Payer: Self-pay | Admitting: *Deleted

## 2015-07-26 ENCOUNTER — Other Ambulatory Visit: Payer: Self-pay | Admitting: Physician Assistant

## 2015-07-29 ENCOUNTER — Other Ambulatory Visit: Payer: Self-pay | Admitting: *Deleted

## 2015-07-29 MED ORDER — SITAGLIPTIN PHOSPHATE 100 MG PO TABS: ORAL_TABLET | ORAL | Status: DC

## 2015-08-01 ENCOUNTER — Ambulatory Visit: Payer: Medicare Other | Admitting: Podiatry

## 2015-08-01 DIAGNOSIS — H40023 Open angle with borderline findings, high risk, bilateral: Secondary | ICD-10-CM | POA: Diagnosis not present

## 2015-08-01 DIAGNOSIS — H2513 Age-related nuclear cataract, bilateral: Secondary | ICD-10-CM | POA: Diagnosis not present

## 2015-08-05 ENCOUNTER — Other Ambulatory Visit: Payer: Self-pay | Admitting: Internal Medicine

## 2015-08-05 ENCOUNTER — Ambulatory Visit (INDEPENDENT_AMBULATORY_CARE_PROVIDER_SITE_OTHER): Payer: Medicare Other | Admitting: Internal Medicine

## 2015-08-05 ENCOUNTER — Encounter: Payer: Self-pay | Admitting: Internal Medicine

## 2015-08-05 VITALS — BP 154/60 | HR 76 | Temp 98.0°F | Resp 16 | Ht 66.0 in | Wt 167.0 lb

## 2015-08-05 DIAGNOSIS — Z720 Tobacco use: Secondary | ICD-10-CM | POA: Diagnosis not present

## 2015-08-05 DIAGNOSIS — Z23 Encounter for immunization: Secondary | ICD-10-CM

## 2015-08-05 DIAGNOSIS — E785 Hyperlipidemia, unspecified: Secondary | ICD-10-CM | POA: Diagnosis not present

## 2015-08-05 DIAGNOSIS — I10 Essential (primary) hypertension: Secondary | ICD-10-CM

## 2015-08-05 DIAGNOSIS — E1351 Other specified diabetes mellitus with diabetic peripheral angiopathy without gangrene: Secondary | ICD-10-CM | POA: Diagnosis not present

## 2015-08-05 DIAGNOSIS — Z79899 Other long term (current) drug therapy: Secondary | ICD-10-CM | POA: Diagnosis not present

## 2015-08-05 DIAGNOSIS — E1129 Type 2 diabetes mellitus with other diabetic kidney complication: Secondary | ICD-10-CM | POA: Diagnosis not present

## 2015-08-05 DIAGNOSIS — N183 Chronic kidney disease, stage 3 unspecified: Secondary | ICD-10-CM

## 2015-08-05 DIAGNOSIS — E1169 Type 2 diabetes mellitus with other specified complication: Secondary | ICD-10-CM | POA: Diagnosis not present

## 2015-08-05 DIAGNOSIS — E1122 Type 2 diabetes mellitus with diabetic chronic kidney disease: Secondary | ICD-10-CM | POA: Diagnosis not present

## 2015-08-05 DIAGNOSIS — E559 Vitamin D deficiency, unspecified: Secondary | ICD-10-CM

## 2015-08-05 LAB — BASIC METABOLIC PANEL WITH GFR
BUN: 11 mg/dL (ref 7–25)
CO2: 28 mmol/L (ref 20–31)
Calcium: 9.3 mg/dL (ref 8.6–10.4)
Chloride: 101 mmol/L (ref 98–110)
Creat: 0.8 mg/dL (ref 0.50–0.99)
GFR, EST AFRICAN AMERICAN: 88 mL/min (ref >=60)
GFR, Est Non African American: 76 mL/min (ref >=60)
Glucose, Bld: 204 mg/dL — ABNORMAL HIGH (ref 65–99)
POTASSIUM: 4.3 mmol/L (ref 3.5–5.3)
SODIUM: 137 mmol/L (ref 135–146)

## 2015-08-05 LAB — LIPID PANEL
Cholesterol: 146 mg/dL (ref 125–200)
HDL: 45 mg/dL — ABNORMAL LOW (ref >=46)
LDL CALC: 86 mg/dL (ref <130)
Total CHOL/HDL Ratio: 3.2 Ratio (ref <=5.0)
Triglycerides: 73 mg/dL (ref <150)
VLDL: 15 mg/dL (ref <30)

## 2015-08-05 LAB — HEPATIC FUNCTION PANEL
ALBUMIN: 3.8 g/dL (ref 3.6–5.1)
ALK PHOS: 87 U/L (ref 33–130)
ALT: 14 U/L (ref 6–29)
AST: 20 U/L (ref 10–35)
Bilirubin, Direct: 0.1 mg/dL (ref <=0.2)
Indirect Bilirubin: 0.3 mg/dL (ref 0.2–1.2)
TOTAL PROTEIN: 6.6 g/dL (ref 6.1–8.1)
Total Bilirubin: 0.4 mg/dL (ref 0.2–1.2)

## 2015-08-05 LAB — CBC WITH DIFFERENTIAL/PLATELET
Basophils Absolute: 0.1 10*3/uL (ref 0.0–0.1)
Basophils Relative: 1 % (ref 0–1)
EOS ABS: 0.1 10*3/uL (ref 0.0–0.7)
EOS PCT: 1 % (ref 0–5)
HCT: 41.9 % (ref 36.0–46.0)
Hemoglobin: 13.6 g/dL (ref 12.0–15.0)
Lymphocytes Relative: 32 % (ref 12–46)
Lymphs Abs: 3 10*3/uL (ref 0.7–4.0)
MCH: 29.8 pg (ref 26.0–34.0)
MCHC: 32.5 g/dL (ref 30.0–36.0)
MCV: 91.7 fL (ref 78.0–100.0)
MPV: 10.2 fL (ref 8.6–12.4)
Monocytes Absolute: 0.7 10*3/uL (ref 0.1–1.0)
Monocytes Relative: 8 % (ref 3–12)
Neutro Abs: 5.4 10*3/uL (ref 1.7–7.7)
Neutrophils Relative %: 58 % (ref 43–77)
PLATELETS: 363 10*3/uL (ref 150–400)
RBC: 4.57 MIL/uL (ref 3.87–5.11)
RDW: 13.9 % (ref 11.5–15.5)
WBC: 9.3 10*3/uL (ref 4.0–10.5)

## 2015-08-05 LAB — HEMOGLOBIN A1C
Hgb A1c MFr Bld: 10 % — ABNORMAL HIGH (ref <5.7)
Mean Plasma Glucose: 240 mg/dL — ABNORMAL HIGH (ref <117)

## 2015-08-05 LAB — TSH: TSH: 1.054 u[IU]/mL (ref 0.350–4.500)

## 2015-08-05 MED ORDER — CITALOPRAM HYDROBROMIDE 20 MG PO TABS: ORAL_TABLET | ORAL | Status: DC

## 2015-08-05 MED ORDER — GLUCOSE BLOOD VI STRP: ORAL_STRIP | Status: DC

## 2015-08-05 MED ORDER — GLUCOSE BLOOD VI STRP: 1.0000 | ORAL_STRIP | Freq: Three times a day (TID) | Status: DC

## 2015-08-05 MED ORDER — SITAGLIPTIN PHOSPHATE 100 MG PO TABS: ORAL_TABLET | ORAL | Status: DC

## 2015-08-05 MED ORDER — RANITIDINE HCL 300 MG PO TABS: 300.0000 mg | ORAL_TABLET | Freq: Every day | ORAL | Status: DC

## 2015-08-05 MED ORDER — AMLODIPINE BESYLATE 10 MG PO TABS: 10.0000 mg | ORAL_TABLET | Freq: Every day | ORAL | Status: DC

## 2015-08-05 MED ORDER — LOSARTAN POTASSIUM 100 MG PO TABS: 100.0000 mg | ORAL_TABLET | Freq: Every day | ORAL | Status: DC

## 2015-08-05 MED ORDER — OMEPRAZOLE 40 MG PO CPDR: 40.0000 mg | DELAYED_RELEASE_CAPSULE | Freq: Every day | ORAL | Status: DC

## 2015-08-05 MED ORDER — METFORMIN HCL ER 500 MG PO TB24: 1000.0000 mg | ORAL_TABLET | Freq: Two times a day (BID) | ORAL | Status: DC

## 2015-08-05 MED ORDER — SIMVASTATIN 20 MG PO TABS: 20.0000 mg | ORAL_TABLET | Freq: Every day | ORAL | Status: DC

## 2015-08-05 MED ORDER — INSULIN ASPART PROT & ASPART (70-30 MIX) 100 UNIT/ML PEN: 15.0000 [IU] | PEN_INJECTOR | Freq: Two times a day (BID) | SUBCUTANEOUS | Status: DC

## 2015-08-05 NOTE — Progress Notes (Signed)
Patient ID: JUNITA KUBOTA, female   DOB: 1946-07-01, 69 y.o.   MRN: [id number]  Assessment and Plan:  Hypertension:  -Continue medication -monitor blood pressure at home. -Continue DASH diet -Reminder to go to the ER if any CP, SOB, nausea, dizziness, severe HA, changes vision/speech, left arm numbness and tingling and jaw pain.  Cholesterol - Continue diet and exercise -Check cholesterol.   Diabetes with diabetic chronic kidney disease  -advised to keep food diary -increase exercise -may need increase in insulin pending A1C level -Continue diet and exercise.  -Check A1C  Vitamin D Def -check level -continue medications.   Abdominal Pain -based o history sounds most consistent with uncontrolled GERD.  Will try 1 month course of ranitidine with omeprazole -avoid trigger foods -small meals -call Dr. Henrene Pastor to get colonoscopy set up  URI Patient wants to wait and watch currenty  Continue diet and meds as discussed. Further disposition pending results of labs. Discussed med's effects and SE's.    HPI 69 y.o. female  presents for 3 month follow up with hypertension, hyperlipidemia, diabetes and vitamin D deficiency.   Her blood pressure has been controlled at home, today their BP is BP: (!) 154/60 mmHg.She does not workout. She denies chest pain, shortness of breath, dizziness.   She is on cholesterol medication and denies myalgias. Her cholesterol is at goal. The cholesterol was:  03/26/2015: Cholesterol 154; HDL 48; LDL Cholesterol 82; Triglycerides 122   She has been working on diet and exercise for diabetes with diabetic chronic kidney disease, she is on bASA, she is on ACE/ARB, and denies  foot ulcerations, hypoglycemia , increased appetite, nausea, paresthesia of the feet, polydipsia, polyuria, visual disturbances, vomiting and weight loss. Last A1C was: 03/26/2015: Hgb A1c MFr Bld 9.8*. She reports that her BS has been very labile lately. She reports that she did run out of her  [place] for the last 3 wees.  She reports that she is generally running around 115 to 240.  She reports that she is still doing 15 units twice daily.     Patient is on Vitamin D supplement. 03/26/2015: Vit D, 25-Hydroxy 33   She reports that her family has been having an upper respiratory infections.  She reports that this morning she woke up with a sore throat.  She reports that this has only been going on 1 day.    She reports that she has been having a lot of abdominal pain that is cramping. She reports that it happens almost immediately after she has food.  She reports that it doesn't feel like diverticulitis.  She reports that it comes and goes and does not happen consistently  She reports that she is also due for colonoscopy but hasn't gotten it set up yet.  Current Medications:  Current Outpatient Prescriptions on File Prior to Visit  Medication Sig Dispense Refill  . aspirin 325 MG tablet Take 325 mg by mouth daily.     . Blood Glucose Monitoring Suppl (ACCU-CHEK AVIVA PLUS) W/DEVICE KIT Check blood sugar up to three times daily 1 kit 0  . Cholecalciferol (VITAMIN D3) 5000 UNITS TABS Take 1 capsule by mouth daily.    Marland Kitchen CINNAMON PO Take 1,000 mg by mouth 2 (two) times daily.     . clonazePAM (KLONOPIN) 0.5 MG tablet Take 1 tablet (0.5 mg total) by mouth 2 (two) times daily. 60 tablet 2  . Insulin Pen Needle (NOVOFINE) 32G X 6 MM MISC USE AS DIRECTED 2 TIMES  A DAY 100 each prn  . IRON PO Take by mouth daily.    . Omega-3 Fatty Acids (FISH OIL) 1200 MG CAPS Take 1 capsule by mouth daily.    Marland Kitchen UNABLE TO FIND Outpatient OT 1 each 0  . vitamin E (VITAMIN E) 400 UNIT capsule Take 400 Units by mouth daily.    Marland Kitchen zinc gluconate 50 MG tablet Take 50 mg by mouth daily.     No current facility-administered medications on file prior to visit.   Medical History:  Past Medical History  Diagnosis Date  . Phlebitis     30 years ago  left leg  . Anxiety   . GERD (gastroesophageal reflux  disease)   . Migraines   . Diabetes mellitus 1998  . Hyperlipidemia   . Hypertension 2000  . Stroke [facility])     multiple mini strokes ( brain aneurysm )  . 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 Status post colonoscopy 07/2010  . Abnormal findings on esophagogastroduodenoscopy (EGD) 07/2010  . Peripheral vascular disease ([place])    Allergies:  Allergies  Allergen Reactions  . Plavix [Clopidogrel Bisulfate] Palpitations  . Amoxicillin Itching and Swelling    FACE & EYES SWELL  . Ace Inhibitors   . Lyrica [Pregabalin]   . Penicillins Other (See Comments)    REACTION: unspecified     Review of Systems:  Review of Systems  Constitutional: Negative for fever, chills and malaise/fatigue.  HENT: Positive for congestion and sore throat. Negative for ear pain.   Eyes: Negative.   Respiratory: Negative for cough, shortness of breath and wheezing.   Cardiovascular: Negative for chest pain, palpitations and leg swelling.  Gastrointestinal: Positive for abdominal pain. Negative for heartburn, diarrhea, constipation, blood in stool and melena.  Genitourinary: Negative.   Skin: Negative.   Neurological: Negative for dizziness, sensory change, loss of consciousness and headaches.  Psychiatric/Behavioral: Negative for depression. The patient is not nervous/anxious and does not have insomnia.     Family history- Review and unchanged  Social history- Review and unchanged  Physical Exam: BP 154/60 mmHg  Pulse 76  Temp(Src) 98 F (36.7 C) (Temporal)  Resp 16  Ht 5' 6"  (1.676 m)  Wt 167 lb (75.751 kg)  BMI 26.97 kg/m2 Wt Readings from Last 3 Encounters:  08/05/15 167 lb (75.751 kg)  03/26/15 161 lb (73.029 kg)  11/14/14 155 lb (70.308 kg)   General Appearance: Well nourished well developed, non-toxic appearing, in no apparent distress. Eyes: PERRLA, EOMs, conjunctiva no swelling or erythema ENT/Mouth: Ear canals clear with no erythema, swelling, or discharge.  TMs normal bilaterally,  oropharynx clear, moist, with no exudate.   Neck: Supple, thyroid normal, no JVD, no cervical adenopathy.  Respiratory: Respiratory effort normal, breath sounds clear A&P, no wheeze, rhonchi or rales noted.  No retractions, no accessory muscle usage Cardio: RRR with no MRGs. No noted edema.  Abdomen: Soft, + BS.  Non tender, no guarding, rebound, hernias, masses. Musculoskeletal: Full ROM, 5/5 strength, Normal gait Skin: Warm, dry without rashes, lesions, ecchymosis.  Neuro: Awake and oriented X 3, Cranial nerves intact. No cerebellar symptoms.  Psych: normal affect, Insight and Judgment appropriate.    Starlyn Skeans, PA-C 11:50 AM [facility]

## 2015-08-05 NOTE — Addendum Note (Signed)
Addended by: Mariluz Crespo A on: 08/05/2015 02:29 PM   Modules accepted: Orders

## 2015-08-05 NOTE — Patient Instructions (Signed)
Please take the ranitidine at bedtime. Avoid laying down right after eating.  Small frequent meals should help to avoid bad reflux.  Food Choices for Gastroesophageal Reflux Disease, Adult When you have gastroesophageal reflux disease (GERD), the foods you eat and your eating habits are very important. Choosing the right foods can help ease the discomfort of GERD. WHAT GENERAL GUIDELINES DO I NEED TO FOLLOW?  Choose fruits, vegetables, whole grains, low-fat dairy products, and low-fat meat, fish, and poultry.  Limit fats such as oils, salad dressings, butter, nuts, and avocado.  Keep a food diary to identify foods that cause symptoms.  Avoid foods that cause reflux. These may be different for different people.  Eat frequent small meals instead of three large meals each day.  Eat your meals slowly, in a relaxed setting.  Limit fried foods.  Cook foods using methods other than frying.  Avoid drinking alcohol.  Avoid drinking large amounts of liquids with your meals.  Avoid bending over or lying down until 2-3 hours after eating. WHAT FOODS ARE NOT RECOMMENDED? The following are some foods and drinks that may worsen your symptoms: Vegetables Tomatoes. Tomato juice. Tomato and spaghetti sauce. Chili peppers. Onion and garlic. Horseradish. Fruits Oranges, grapefruit, and lemon (fruit and juice). Meats High-fat meats, fish, and poultry. This includes hot dogs, ribs, ham, sausage, salami, and bacon. Dairy Whole milk and chocolate milk. Sour cream. Cream. Butter. Ice cream. Cream cheese.  Beverages Coffee and tea, with or without caffeine. Carbonated beverages or energy drinks. Condiments Hot sauce. Barbecue sauce.  Sweets/Desserts Chocolate and cocoa. Donuts. Peppermint and spearmint. Fats and Oils High-fat foods, including [place] fries and potato chips. Other Vinegar. Strong spices, such as black pepper, white pepper, red pepper, cayenne, curry powder, cloves, ginger, and  chili powder. The items listed above may not be a complete list of foods and beverages to avoid. Contact your dietitian for more information.   This information is not intended to replace advice given to you by your health care provider. Make sure you discuss any questions you have with your health care provider.   Document Released: 09/06/2005 Document Revised: 09/27/2014 Document Reviewed: 07/11/2013 Elsevier Interactive Patient Education [employer].

## 2015-09-26 DIAGNOSIS — H2513 Age-related nuclear cataract, bilateral: Secondary | ICD-10-CM | POA: Diagnosis not present

## 2015-09-26 DIAGNOSIS — E118 Type 2 diabetes mellitus with unspecified complications: Secondary | ICD-10-CM | POA: Diagnosis not present

## 2015-09-26 DIAGNOSIS — H25012 Cortical age-related cataract, left eye: Secondary | ICD-10-CM | POA: Diagnosis not present

## 2015-09-26 DIAGNOSIS — H40033 Anatomical narrow angle, bilateral: Secondary | ICD-10-CM | POA: Diagnosis not present

## 2015-10-02 ENCOUNTER — Other Ambulatory Visit: Payer: Self-pay | Admitting: Physician Assistant

## 2015-10-02 DIAGNOSIS — F411 Generalized anxiety disorder: Secondary | ICD-10-CM

## 2015-10-03 DIAGNOSIS — R2681 Unsteadiness on feet: Secondary | ICD-10-CM | POA: Diagnosis not present

## 2015-10-03 DIAGNOSIS — I671 Cerebral aneurysm, nonruptured: Secondary | ICD-10-CM | POA: Diagnosis not present

## 2015-10-03 DIAGNOSIS — G43109 Migraine with aura, not intractable, without status migrainosus: Secondary | ICD-10-CM | POA: Diagnosis not present

## 2015-10-03 DIAGNOSIS — R030 Elevated blood-pressure reading, without diagnosis of hypertension: Secondary | ICD-10-CM | POA: Diagnosis not present

## 2015-10-03 DIAGNOSIS — I672 Cerebral atherosclerosis: Secondary | ICD-10-CM | POA: Diagnosis not present

## 2015-10-10 ENCOUNTER — Ambulatory Visit: Payer: Medicare Other | Admitting: Podiatry

## 2015-10-13 ENCOUNTER — Ambulatory Visit (INDEPENDENT_AMBULATORY_CARE_PROVIDER_SITE_OTHER): Payer: Medicare Other | Admitting: Podiatry

## 2015-10-13 ENCOUNTER — Ambulatory Visit (INDEPENDENT_AMBULATORY_CARE_PROVIDER_SITE_OTHER): Payer: Medicare Other

## 2015-10-13 DIAGNOSIS — B351 Tinea unguium: Secondary | ICD-10-CM | POA: Diagnosis not present

## 2015-10-13 DIAGNOSIS — R52 Pain, unspecified: Secondary | ICD-10-CM | POA: Diagnosis not present

## 2015-10-13 DIAGNOSIS — M2040 Other hammer toe(s) (acquired), unspecified foot: Secondary | ICD-10-CM

## 2015-10-13 DIAGNOSIS — M216X9 Other acquired deformities of unspecified foot: Secondary | ICD-10-CM

## 2015-10-13 DIAGNOSIS — M79676 Pain in unspecified toe(s): Secondary | ICD-10-CM | POA: Diagnosis not present

## 2015-10-13 DIAGNOSIS — I739 Peripheral vascular disease, unspecified: Secondary | ICD-10-CM

## 2015-10-13 NOTE — Progress Notes (Signed)
Patient ID: [name], female   DOB: [birth year], 70 y.o.   MRN: [id number]  Subjective: 70 y.o. returns the office today for painful, elongated, thickened toenails which she cannot trim herself. Denies any redness or drainage around the nails. She states that that she is getting pain in the ball of her feet on both feet was been ongoing for last couple of months. No recent injury or trauma. No swelling or redness. She does have tingling and numbness her feet she does have neuropathy as well as bad circulation. She's had previous stents placed. Denies any acute changes since last appointment and no new complaints today. Denies any systemic complaints such as fevers, chills, nausea, vomiting.   Objective: AAO 3, NAD DP/PT pulses decreased, CRT less than 3 seconds Protective sensation decreased with Simms Weinstein monofilament Nails hypertrophic, dystrophic, elongated, brittle, discolored 10. There is tenderness overlying the nails 1-5 bilaterally. There is no surrounding erythema or drainage along the nail sites. No open lesions or pre-ulcerative lesions are identified. There is mild tenderness to palpation submetatarsal 2 bilaterally there's prominence the metatarsal heads and atrophy of fat pad. There is no pain vibratory sensation there is no pain on the dorsal aspect. There is mild hammertoe contractures. No other areas of tenderness bilateral lower extremities. No overlying edema, erythema, increased warmth. No pain with calf compression, swelling, warmth, erythema.  Assessment: Patient presents with symptomatic onychomycosis; metatarsalgia/hammertoes  Plan: -Treatment options including alternatives, risks, complications were discussed -X-rays were obtained and reviewed with the patient. No evidence of acute fracture stress fracture. -Nails sharply debrided 10 without complication/bleeding. -Removable metatarsal pads were dispensed. Discussed shoe gear modifications. -Discussed daily  foot inspection. If there are any changes, to call the office immediately.  -Follow-up in 3 months or sooner if any problems are to arise. In the meantime, encouraged to call the office with any questions, concerns, changes symptoms. If the pain is not improved to the bottom erythema in the next couple weeks to call sooner.  Celesta Gentile, DPM

## 2015-10-14 ENCOUNTER — Encounter: Payer: Self-pay | Admitting: Internal Medicine

## 2015-10-14 ENCOUNTER — Other Ambulatory Visit: Payer: Self-pay | Admitting: Internal Medicine

## 2015-10-22 DIAGNOSIS — H2511 Age-related nuclear cataract, right eye: Secondary | ICD-10-CM | POA: Diagnosis not present

## 2015-10-22 HISTORY — PX: OTHER SURGICAL HISTORY: SHX169

## 2015-10-27 ENCOUNTER — Encounter: Payer: Self-pay | Admitting: Internal Medicine

## 2015-10-27 ENCOUNTER — Other Ambulatory Visit: Payer: Self-pay | Admitting: Internal Medicine

## 2015-10-29 ENCOUNTER — Other Ambulatory Visit: Payer: Self-pay | Admitting: *Deleted

## 2015-10-29 MED ORDER — INSULIN ASPART PROT & ASPART (70-30 MIX) 100 UNIT/ML PEN: 15.0000 [IU] | PEN_INJECTOR | Freq: Two times a day (BID) | SUBCUTANEOUS | Status: DC

## 2015-10-31 DIAGNOSIS — H25012 Cortical age-related cataract, left eye: Secondary | ICD-10-CM | POA: Diagnosis not present

## 2015-10-31 DIAGNOSIS — H2512 Age-related nuclear cataract, left eye: Secondary | ICD-10-CM | POA: Diagnosis not present

## 2015-11-04 ENCOUNTER — Other Ambulatory Visit: Payer: Self-pay | Admitting: Internal Medicine

## 2015-11-05 DIAGNOSIS — H2512 Age-related nuclear cataract, left eye: Secondary | ICD-10-CM | POA: Diagnosis not present

## 2015-11-05 HISTORY — PX: OTHER SURGICAL HISTORY: SHX169

## 2015-11-07 ENCOUNTER — Encounter: Payer: Self-pay | Admitting: Internal Medicine

## 2015-11-07 ENCOUNTER — Ambulatory Visit (INDEPENDENT_AMBULATORY_CARE_PROVIDER_SITE_OTHER): Payer: Medicare Other | Admitting: Internal Medicine

## 2015-11-07 ENCOUNTER — Other Ambulatory Visit: Payer: Self-pay | Admitting: *Deleted

## 2015-11-07 VITALS — BP 138/66 | HR 76 | Temp 97.5°F | Resp 16 | Ht 66.0 in | Wt 167.2 lb

## 2015-11-07 DIAGNOSIS — N183 Chronic kidney disease, stage 3 unspecified: Secondary | ICD-10-CM

## 2015-11-07 DIAGNOSIS — Z794 Long term (current) use of insulin: Secondary | ICD-10-CM | POA: Diagnosis not present

## 2015-11-07 DIAGNOSIS — K219 Gastro-esophageal reflux disease without esophagitis: Secondary | ICD-10-CM | POA: Diagnosis not present

## 2015-11-07 DIAGNOSIS — E119 Type 2 diabetes mellitus without complications: Secondary | ICD-10-CM

## 2015-11-07 DIAGNOSIS — Z79899 Other long term (current) drug therapy: Secondary | ICD-10-CM

## 2015-11-07 DIAGNOSIS — E1122 Type 2 diabetes mellitus with diabetic chronic kidney disease: Secondary | ICD-10-CM

## 2015-11-07 DIAGNOSIS — I10 Essential (primary) hypertension: Secondary | ICD-10-CM | POA: Diagnosis not present

## 2015-11-07 DIAGNOSIS — E559 Vitamin D deficiency, unspecified: Secondary | ICD-10-CM | POA: Diagnosis not present

## 2015-11-07 DIAGNOSIS — E1159 Type 2 diabetes mellitus with other circulatory complications: Secondary | ICD-10-CM | POA: Insufficient documentation

## 2015-11-07 DIAGNOSIS — E1169 Type 2 diabetes mellitus with other specified complication: Secondary | ICD-10-CM | POA: Insufficient documentation

## 2015-11-07 DIAGNOSIS — E785 Hyperlipidemia, unspecified: Secondary | ICD-10-CM | POA: Diagnosis not present

## 2015-11-07 DIAGNOSIS — E782 Mixed hyperlipidemia: Secondary | ICD-10-CM | POA: Insufficient documentation

## 2015-11-07 LAB — CBC WITH DIFFERENTIAL/PLATELET
Basophils Absolute: 0.1 10*3/uL (ref 0.0–0.1)
Basophils Relative: 1 % (ref 0–1)
EOS ABS: 0.1 10*3/uL (ref 0.0–0.7)
Eosinophils Relative: 1 % (ref 0–5)
HEMATOCRIT: 43.5 % (ref 36.0–46.0)
HEMOGLOBIN: 13.7 g/dL (ref 12.0–15.0)
Lymphocytes Relative: 29 % (ref 12–46)
Lymphs Abs: 2.8 10*3/uL (ref 0.7–4.0)
MCH: 29 pg (ref 26.0–34.0)
MCHC: 31.5 g/dL (ref 30.0–36.0)
MCV: 92 fL (ref 78.0–100.0)
MONOS PCT: 5 % (ref 3–12)
MPV: 10.2 fL (ref 8.6–12.4)
Monocytes Absolute: 0.5 10*3/uL (ref 0.1–1.0)
NEUTROS ABS: 6.1 10*3/uL (ref 1.7–7.7)
Neutrophils Relative %: 64 % (ref 43–77)
Platelets: 358 10*3/uL (ref 150–400)
RBC: 4.73 MIL/uL (ref 3.87–5.11)
RDW: 14 % (ref 11.5–15.5)
WBC: 9.6 10*3/uL (ref 4.0–10.5)

## 2015-11-07 LAB — HEPATIC FUNCTION PANEL
ALBUMIN: 4 g/dL (ref 3.6–5.1)
ALK PHOS: 80 U/L (ref 33–130)
ALT: 13 U/L (ref 6–29)
AST: 19 U/L (ref 10–35)
BILIRUBIN DIRECT: 0.1 mg/dL (ref <=0.2)
Indirect Bilirubin: 0.5 mg/dL (ref 0.2–1.2)
Total Bilirubin: 0.6 mg/dL (ref 0.2–1.2)
Total Protein: 7.1 g/dL (ref 6.1–8.1)

## 2015-11-07 LAB — BASIC METABOLIC PANEL WITH GFR
BUN: 15 mg/dL (ref 7–25)
CO2: 27 mmol/L (ref 20–31)
Calcium: 9.5 mg/dL (ref 8.6–10.4)
Chloride: 100 mmol/L (ref 98–110)
Creat: 0.96 mg/dL (ref 0.50–0.99)
GFR, EST AFRICAN AMERICAN: 70 mL/min (ref >=60)
GFR, EST NON AFRICAN AMERICAN: 61 mL/min (ref >=60)
Glucose, Bld: 224 mg/dL — ABNORMAL HIGH (ref 65–99)
POTASSIUM: 4.5 mmol/L (ref 3.5–5.3)
SODIUM: 137 mmol/L (ref 135–146)

## 2015-11-07 LAB — TSH: TSH: 2 mIU/L

## 2015-11-07 LAB — LIPID PANEL
CHOL/HDL RATIO: 3.2 ratio (ref <=5.0)
Cholesterol: 161 mg/dL (ref 125–200)
HDL: 50 mg/dL (ref >=46)
LDL Cholesterol: 91 mg/dL (ref <130)
TRIGLYCERIDES: 99 mg/dL (ref <150)
VLDL: 20 mg/dL (ref <30)

## 2015-11-07 LAB — HEMOGLOBIN A1C
HEMOGLOBIN A1C: 9.3 % — AB (ref <5.7)
Mean Plasma Glucose: 220 mg/dL — ABNORMAL HIGH (ref <117)

## 2015-11-07 LAB — MAGNESIUM: Magnesium: 1.6 mg/dL (ref 1.5–2.5)

## 2015-11-07 MED ORDER — METFORMIN HCL ER 500 MG PO TB24: 1000.0000 mg | ORAL_TABLET | Freq: Two times a day (BID) | ORAL | Status: DC

## 2015-11-07 NOTE — Progress Notes (Signed)
Patient ID: [name], female   DOB: 04-24-1946, 70 y.o.   MRN: [id number]   This very nice 70 y.o. MBF presents for  follow up with Hypertension, Hyperlipidemia, Pre-Diabetes and Vitamin D Deficiency.    Patient is treated for HTN & BP has been controlled at home. Today's BP: 138/66 mmHg. Patient has had no complaints of any cardiac type chest pain, palpitations, dyspnea/orthopnea/PND, dizziness, claudication, or dependent edema.   Hyperlipidemia is controlled with diet & meds. Patient denies myalgias or other med SE's. Last Lipids were at goal with Cholesterol 146; HDL 45*; LDL 86; Triglycerides 73 on 08/05/2015.   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  Currently she is on Novolog 70/30 Mix taking 15 units qam & 12 u qpm.  Last A1c was not at goal attributed to poor dietary compliance with A1c 10.0% on 08/05/2015.    Further, the patient also has history of Vitamin D Deficiency and supplements vitamin D without any suspected side-effects. Last vitamin D was still low at 33 on 03/26/2015.  Medication Sig  . amLODipine (NORVASC) 10 MG tablet Take 1 tablet (10 mg total) by mouth daily.  Marland Kitchen aspirin 325 MG tablet Take 325 mg by mouth daily.   Marland Kitchen VITAMIN D3 5000 UNITS  Take 1 capsule by mouth daily.  Marland Kitchen CINNAMON PO Take 1,000 mg by mouth 2 (two) times daily.   . citalopram (CELEXA) 20 MG tablet TAKE 1 TABLET (20 MG TOTAL) BY MOUTH DAILY.  . clonazePAM (KLONOPIN) 0.5 MG tablet TAKE 1 TABLET BY MOUTH TWICE A DAY  . NOVOLOG MIX 70/30 FLEXPEN Inject 0.15 mLs (15 Units total) into the skin 2 (two) times daily with a meal.  . IRON PO Take by mouth daily.  Marland Kitchen losartan (COZAAR) 100 MG tablet Take 1 tablet (100 mg total) by mouth daily.  . metFORMIN-XR 500 MG Take 2 tablets (1,000 mg total) by mouth 2 (two) times daily.  . Omega-3 FISH OIL 1200 MG CAPS Take 1 capsule  by mouth daily.  Marland Kitchen omeprazole (PRILOSEC) 40 MG capsule Take 1 capsule (40 mg total) by mouth daily.  . ranitidine (ZANTAC) 300 MG tablet Take 1 tablet (300 mg total) by mouth at bedtime.  . simvastatin (ZOCOR) 20 MG tablet TAKE 1 TABLET (20 MG TOTAL) BY MOUTH AT BEDTIME.  . vitamin E  400 UNIT capsule Take 400 Units by mouth daily.  Marland Kitchen zinc gluconate 50 MG tablet Take 50 mg by mouth daily.   Allergies  Allergen Reactions  . Plavix [Clopidogrel Bisulfate] Palpitations  . Amoxicillin Itching and Swelling    FACE & EYES SWELL  . Ace Inhibitors   . Lyrica [Pregabalin]   . Penicillins Other (See Comments)    REACTION: unspecified    PMHx:   Past Medical History  Diagnosis Date  . Phlebitis     30 years ago  left leg  . Anxiety   . GERD (gastroesophageal reflux disease)   . Migraines   . Diabetes mellitus 1998  . Hyperlipidemia   . Hypertension 2000  . Stroke [facility])     multiple mini strokes ( brain aneurysm )  . 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 Status post colonoscopy 07/2010  . Abnormal findings on esophagogastroduodenoscopy (EGD) 07/2010  . Peripheral vascular disease ([place])    Immunization History  Administered Date(s) Administered  .  DT 03/26/2015  . Influenza Whole 08/02/2007, 06/18/2009, 07/09/2013  . Influenza, High Dose Seasonal PF 08/05/2015  . Pneumococcal Polysaccharide-23 02/08/2014  . Td 09/21/2004   Past Surgical History  Procedure Laterality Date  . Cesarean section    . Cholecystectomy    . Knee surgery    . Rotator cuff surgery    . Thyroid surgery    . Hernia repair      times two  . Abdominal hysterectomy  1984  . Femoral artery stent  05/11/11    Left superficial femoral and popliteal artery  . Dental surgery  Aug. 16, 2013    left lower   . Femoral-popliteal bypass graft  Nov. 26, 2013    Left Angiogram-   . Eye surgery  Nov. 2014    Laser-Glaucoma  . Esophagogastroduodenoscopy  07/2010  . Colonoscopy  07/2010  . Abdominal aortagram  N/A 01/04/2012    Procedure: ABDOMINAL Maxcine Ham;  Surgeon: Serafina Mitchell, MD;  Location: [facility] CATH LAB;  Service: Cardiovascular;  Laterality: N/A;  . Abdominal aortagram N/A 08/15/2012    Procedure: ABDOMINAL AORTAGRAM;  Surgeon: Serafina Mitchell, MD;  Location: [facility] CATH LAB;  Service: Cardiovascular;  Laterality: N/A;  . Lower extremity angiogram Left 08/15/2012    Procedure: LOWER EXTREMITY ANGIOGRAM;  Surgeon: Serafina Mitchell, MD;  Location: [facility] CATH LAB;  Service: Cardiovascular;  Laterality: Left;  lt leg angio   FHx:    Reviewed / unchanged  SHx:    Reviewed / unchanged  Systems Review:  Constitutional: Denies fever, chills, wt changes, headaches, insomnia, fatigue, night sweats, change in appetite. Eyes: Denies redness, blurred vision, diplopia, discharge, itchy, watery eyes.  ENT: Denies discharge, congestion, post nasal drip, epistaxis, sore throat, earache, hearing loss, dental pain, tinnitus, vertigo, sinus pain, snoring.  CV: Denies chest pain, palpitations, irregular heartbeat, syncope, dyspnea, diaphoresis, orthopnea, PND, claudication or edema. Respiratory: denies cough, dyspnea, DOE, pleurisy, hoarseness, laryngitis, wheezing.  Gastrointestinal: Denies dysphagia, odynophagia, heartburn, reflux, water brash, abdominal pain or cramps, nausea, vomiting, bloating, diarrhea, constipation, hematemesis, melena, hematochezia  or hemorrhoids. Genitourinary: Denies dysuria, frequency, urgency, nocturia, hesitancy, discharge, hematuria or flank pain. Musculoskeletal: Denies arthralgias, myalgias, stiffness, jt. swelling, pain, limping or strain/sprain.  Skin: Denies pruritus, rash, hives, warts, acne, eczema or change in skin lesion(s). Neuro: No weakness, tremor, incoordination, spasms, paresthesia or pain. Psychiatric: Denies confusion, memory loss or sensory loss. Endo: Denies change in weight, skin or hair change.  Heme/Lymph: No excessive bleeding, bruising or enlarged lymph  nodes.  Physical Exam  BP 138/66 mmHg  Pulse 76  Temp(Src) 97.5 F (36.4 C)  Resp 16  Ht 5\' 6"  (1.676 m)  Wt 167 lb 3.2 oz (75.841 kg)  BMI 27.00 kg/m2  Appears well nourished and in no distress. Eyes: PERRLA, EOMs, conjunctiva no swelling or erythema. Sinuses: No frontal/maxillary tenderness ENT/Mouth: EAC's clear, TM's nl w/o erythema, bulging. Nares clear w/o erythema, swelling, exudates. Oropharynx clear without erythema or exudates. Oral hygiene is good. Tongue normal, non obstructing. Hearing intact.  Neck: Supple. Thyroid nl. Car 2+/2+ without bruits, nodes or JVD. Chest: Respirations nl with BS clear & equal w/o rales, rhonchi, wheezing or stridor.  Cor: Heart sounds normal w/ regular rate and rhythm without sig. murmurs, gallops, clicks, or rubs. Peripheral pulses normal and equal  without edema.  Abdomen: Soft & bowel sounds normal. Non-tender w/o guarding, rebound, hernias, masses, or organomegaly.  Lymphatics: Unremarkable.  Musculoskeletal: Full ROM all peripheral extremities, joint stability, 5/5 strength, and normal  gait.  Skin: Warm, dry without exposed rashes, lesions or ecchymosis apparent.  Neuro: Cranial nerves intact, reflexes equal bilaterally. Sensory-motor testing grossly intact. Tendon reflexes grossly intact.  Pysch: Alert & oriented x 3.  Insight and judgement nl & appropriate. No ideations.  Assessment and Plan:  1. Essential hypertension  - TSH  2. Hyperlipidemia  - Lipid panel - TSH  3. Insulin-requiring or dependent type II diabetes mellitus ([place])  - Hemoglobin A1c - Insulin, random  4. Vitamin D deficiency  - VITAMIN D 25 Hydroxy   5. CKD stage 3 due to type 2 diabetes mellitus ([place])   6. Gastroesophageal reflux disease   7. Medication management  - CBC with Differential/Platelet - BASIC METABOLIC PANEL WITH GFR - Hepatic function panel - Magnesium   Recommended regular exercise, BP monitoring, weight control, and discussed  med and SE's. Recommended labs to assess and monitor clinical status. Further disposition pending results of labs. Over 30 minutes of exam, counseling, chart review was performed

## 2015-11-07 NOTE — Patient Instructions (Signed)

## 2015-11-08 LAB — VITAMIN D 25 HYDROXY (VIT D DEFICIENCY, FRACTURES): Vit D, 25-Hydroxy: 26 ng/mL — ABNORMAL LOW (ref 30–100)

## 2015-11-10 LAB — INSULIN, RANDOM: Insulin: 34.8 u[IU]/mL — ABNORMAL HIGH (ref 2.0–19.6)

## 2015-11-12 ENCOUNTER — Encounter: Payer: Self-pay | Admitting: Family

## 2015-11-17 ENCOUNTER — Inpatient Hospital Stay (HOSPITAL_COMMUNITY): Admission: RE | Admit: 2015-11-17 | Payer: Medicare Other | Source: Ambulatory Visit

## 2015-11-17 ENCOUNTER — Ambulatory Visit: Payer: Medicare Other | Admitting: Family

## 2015-12-08 ENCOUNTER — Encounter: Payer: Self-pay | Admitting: Family

## 2015-12-15 ENCOUNTER — Ambulatory Visit (HOSPITAL_COMMUNITY)
Admission: RE | Admit: 2015-12-15 | Discharge: 2015-12-15 | Disposition: A | Discharge status: Home / Self Care | Payer: Medicare Other | Source: Ambulatory Visit | Attending: Family | Admitting: Family

## 2015-12-15 ENCOUNTER — Other Ambulatory Visit: Payer: Self-pay | Admitting: *Deleted

## 2015-12-15 ENCOUNTER — Ambulatory Visit (INDEPENDENT_AMBULATORY_CARE_PROVIDER_SITE_OTHER): Payer: Medicare Other | Admitting: Family

## 2015-12-15 ENCOUNTER — Encounter: Payer: Self-pay | Admitting: Family

## 2015-12-15 VITALS — BP 126/74 | HR 74 | Temp 98.3°F | Resp 18 | Ht 66.75 in | Wt 170.6 lb

## 2015-12-15 DIAGNOSIS — Z48812 Encounter for surgical aftercare following surgery on the circulatory system: Secondary | ICD-10-CM | POA: Insufficient documentation

## 2015-12-15 DIAGNOSIS — F17200 Nicotine dependence, unspecified, uncomplicated: Secondary | ICD-10-CM

## 2015-12-15 DIAGNOSIS — E1151 Type 2 diabetes mellitus with diabetic peripheral angiopathy without gangrene: Secondary | ICD-10-CM | POA: Diagnosis not present

## 2015-12-15 DIAGNOSIS — E785 Hyperlipidemia, unspecified: Secondary | ICD-10-CM | POA: Diagnosis not present

## 2015-12-15 DIAGNOSIS — K219 Gastro-esophageal reflux disease without esophagitis: Secondary | ICD-10-CM | POA: Diagnosis not present

## 2015-12-15 DIAGNOSIS — I739 Peripheral vascular disease, unspecified: Secondary | ICD-10-CM

## 2015-12-15 DIAGNOSIS — Z959 Presence of cardiac and vascular implant and graft, unspecified: Secondary | ICD-10-CM

## 2015-12-15 DIAGNOSIS — E119 Type 2 diabetes mellitus without complications: Secondary | ICD-10-CM | POA: Diagnosis not present

## 2015-12-15 DIAGNOSIS — R938 Abnormal findings on diagnostic imaging of other specified body structures: Secondary | ICD-10-CM | POA: Diagnosis not present

## 2015-12-15 DIAGNOSIS — Z720 Tobacco use: Secondary | ICD-10-CM | POA: Diagnosis not present

## 2015-12-15 DIAGNOSIS — F419 Anxiety disorder, unspecified: Secondary | ICD-10-CM | POA: Insufficient documentation

## 2015-12-15 DIAGNOSIS — I10 Essential (primary) hypertension: Secondary | ICD-10-CM | POA: Diagnosis not present

## 2015-12-15 NOTE — Progress Notes (Signed)
Filed Vitals:   12/15/15 1159 12/15/15 1214  BP: 143/72 126/74  Pulse: 74   Temp: 98.3 F (36.8 C)   TempSrc: Oral   Resp: 18   Height: 5' 6.75" (1.695 m)   Weight: 170 lb 9.6 oz (77.384 kg)   SpO2: 95%

## 2015-12-15 NOTE — Patient Instructions (Signed)
Peripheral Vascular Disease Peripheral vascular disease (PVD) is a disease of the blood vessels that are not part of your heart and brain. A simple term for PVD is poor circulation. In most cases, PVD narrows the blood vessels that carry blood from your heart to the rest of your body. This can result in a decreased supply of blood to your arms, legs, and internal organs, like your stomach or kidneys. However, it most often affects a person's lower legs and feet. There are two types of PVD.  Organic PVD. This is the more common type. It is caused by damage to the structure of blood vessels.  Functional PVD. This is caused by conditions that make blood vessels contract and tighten (spasm). Without treatment, PVD tends to get worse over time. PVD can also lead to acute ischemic limb. This is when an arm or limb suddenly has trouble getting enough blood. This is a medical emergency. CAUSES Each type of PVD has many different causes. The most common cause of PVD is buildup of a fatty material (plaque) inside of your arteries (atherosclerosis). Small amounts of plaque can break off from the walls of the blood vessels and become lodged in a smaller artery. This blocks blood flow and can cause acute ischemic limb. Other common causes of PVD include:  Blood clots that form inside of blood vessels.  Injuries to blood vessels.  Diseases that cause inflammation of blood vessels or cause blood vessel spasms.  Health behaviors and health history that increase your risk of developing PVD. RISK FACTORS  You may have a greater risk of PVD if you:  Have a family history of PVD.  Have certain medical conditions, including:  High cholesterol.  Diabetes.  High blood pressure (hypertension).  Coronary heart disease.  Past problems with blood clots.  Past injury, such as burns or a broken bone. These may have damaged blood vessels in your limbs.  Buerger disease. This is caused by inflamed blood  vessels in your hands and feet.  Some forms of arthritis.  Rare birth defects that affect the arteries in your legs.  Use tobacco.  Do not get enough exercise.  Are obese.  Are age 50 or older. SIGNS AND SYMPTOMS  PVD may cause many different symptoms. Your symptoms depend on what part of your body is not getting enough blood. Some common signs and symptoms include:  Cramps in your lower legs. This may be a symptom of poor leg circulation (claudication).  Pain and weakness in your legs while you are physically active that goes away when you rest (intermittent claudication).  Leg pain when at rest.  Leg numbness, tingling, or weakness.  Coldness in a leg or foot, especially when compared with the other leg.  Skin or hair changes. These can include:  Hair loss.  Shiny skin.  Pale or bluish skin.  Thick toenails.  Inability to get or maintain an erection (erectile dysfunction). People with PVD are more prone to developing ulcers and sores on their toes, feet, or legs. These may take longer than normal to heal. DIAGNOSIS Your health care provider may diagnose PVD from your signs and symptoms. The health care provider will also do a physical exam. You may have tests to find out what is causing your PVD and determine its severity. Tests may include:  Blood pressure recordings from your arms and legs and measurements of the strength of your pulses (pulse volume recordings).  Imaging studies using sound waves to take pictures of   the blood flow through your blood vessels (Doppler ultrasound).  Injecting a dye into your blood vessels before having imaging studies using:  X-rays (angiogram or arteriogram).  Computer-generated X-rays (CT angiogram).  A powerful electromagnetic field and a computer (magnetic resonance angiogram or MRA). TREATMENT Treatment for PVD depends on the cause of your condition and the severity of your symptoms. It also depends on your age. Underlying  causes need to be treated and controlled. These include long-lasting (chronic) conditions, such as diabetes, high cholesterol, and high blood pressure. You may need to first try making lifestyle changes and taking medicines. Surgery may be needed if these do not work. Lifestyle changes may include:  Quitting smoking.  Exercising regularly.  Following a low-fat, low-cholesterol diet. Medicines may include:  Blood thinners to prevent blood clots.  Medicines to improve blood flow.  Medicines to improve your blood cholesterol levels. Surgical procedures may include:  A procedure that uses an inflated balloon to open a blocked artery and improve blood flow (angioplasty).  A procedure to put in a tube (stent) to keep a blocked artery open (stent implant).  Surgery to reroute blood flow around a blocked artery (peripheral bypass surgery).  Surgery to remove dead tissue from an infected wound on the affected limb.  Amputation. This is surgical removal of the affected limb. This may be necessary in cases of acute ischemic limb that are not improved through medical or surgical treatments. HOME CARE INSTRUCTIONS  Take medicines only as directed by your health care provider.  Do not use any tobacco products, including cigarettes, chewing tobacco, or electronic cigarettes. If you need help quitting, ask your health care provider.  Lose weight if you are overweight, and maintain a healthy weight as directed by your health care provider.  Eat a diet that is low in fat and cholesterol. If you need help, ask your health care provider.  Exercise regularly. Ask your health care provider to suggest some good activities for you.  Use compression stockings or other mechanical devices as directed by your health care provider.  Take good care of your feet.  Wear comfortable shoes that fit well.  Check your feet often for any cuts or sores. SEEK MEDICAL CARE IF:  You have cramps in your legs  while walking.  You have leg pain when you are at rest.  You have coldness in a leg or foot.  Your skin changes.  You have erectile dysfunction.  You have cuts or sores on your feet that are not healing. SEEK IMMEDIATE MEDICAL CARE IF:  Your arm or leg turns cold and blue.  Your arms or legs become red, warm, swollen, painful, or numb.  You have chest pain or trouble breathing.  You suddenly have weakness in your face, arm, or leg.  You become very confused or lose the ability to speak.  You suddenly have a very bad headache or lose your vision.   This information is not intended to replace advice given to you by your health care provider. Make sure you discuss any questions you have with your health care provider.   Document Released: 10/14/2004 Document Revised: 09/27/2014 Document Reviewed: 02/14/2014 Elsevier Interactive Patient Education 2016 Elsevier Inc.     Steps to Quit Smoking  Smoking tobacco can be harmful to your health and can affect almost every organ in your body. Smoking puts you, and those around you, at risk for developing many serious chronic diseases. Quitting smoking is difficult, but it is one of   the best things that you can do for your health. It is never too late to quit. WHAT ARE THE BENEFITS OF QUITTING SMOKING? When you quit smoking, you lower your risk of developing serious diseases and conditions, such as:  Lung cancer or lung disease, such as COPD.  Heart disease.  Stroke.  Heart attack.  Infertility.  Osteoporosis and bone fractures. Additionally, symptoms such as coughing, wheezing, and shortness of breath may get better when you quit. You may also find that you get sick less often because your body is stronger at fighting off colds and infections. If you are pregnant, quitting smoking can help to reduce your chances of having a baby of low birth weight. HOW DO I GET READY TO QUIT? When you decide to quit smoking, create a plan to  make sure that you are successful. Before you quit:  Pick a date to quit. Set a date within the next two weeks to give you time to prepare.  Write down the reasons why you are quitting. Keep this list in places where you will see it often, such as on your bathroom mirror or in your car or wallet.  Identify the people, places, things, and activities that make you want to smoke (triggers) and avoid them. Make sure to take these actions:  Throw away all cigarettes at home, at work, and in your car.  Throw away smoking accessories, such as ashtrays and lighters.  Clean your car and make sure to empty the ashtray.  Clean your home, including curtains and carpets.  Tell your family, friends, and coworkers that you are quitting. Support from your loved ones can make quitting easier.  Talk with your health care provider about your options for quitting smoking.  Find out what treatment options are covered by your health insurance. WHAT STRATEGIES CAN I USE TO QUIT SMOKING?  Talk with your healthcare provider about different strategies to quit smoking. Some strategies include:  Quitting smoking altogether instead of gradually lessening how much you smoke over a period of time. Research shows that quitting "cold turkey" is more successful than gradually quitting.  Attending in-person counseling to help you build problem-solving skills. You are more likely to have success in quitting if you attend several counseling sessions. Even short sessions of 10 minutes can be effective.  Finding resources and support systems that can help you to quit smoking and remain smoke-free after you quit. These resources are most helpful when you use them often. They can include:  Online chats with a counselor.  Telephone quitlines.  Printed self-help materials.  Support groups or group counseling.  Text messaging programs.  Mobile phone applications.  Taking medicines to help you quit smoking. (If you are  pregnant or breastfeeding, talk with your health care provider first.) Some medicines contain nicotine and some do not. Both types of medicines help with cravings, but the medicines that include nicotine help to relieve withdrawal symptoms. Your health care provider may recommend:  Nicotine patches, gum, or lozenges.  Nicotine inhalers or sprays.  Non-nicotine medicine that is taken by mouth. Talk with your health care provider about combining strategies, such as taking medicines while you are also receiving in-person counseling. Using these two strategies together makes you more likely to succeed in quitting than if you used either strategy on its own. If you are pregnant or breastfeeding, talk with your health care provider about finding counseling or other support strategies to quit smoking. Do not take medicine to help you   quit smoking unless told to do so by your health care provider. WHAT THINGS CAN I DO TO MAKE IT EASIER TO QUIT? Quitting smoking might feel overwhelming at first, but there is a lot that you can do to make it easier. Take these important actions:  Reach out to your family and friends and ask that they support and encourage you during this time. Call telephone quitlines, reach out to support groups, or work with a counselor for support.  Ask people who smoke to avoid smoking around you.  Avoid places that trigger you to smoke, such as bars, parties, or smoke-break areas at work.  Spend time around people who do not smoke.  Lessen stress in your life, because stress can be a smoking trigger for some people. To lessen stress, try:  Exercising regularly.  Deep-breathing exercises.  Yoga.  Meditating.  Performing a body scan. This involves closing your eyes, scanning your body from head to toe, and noticing which parts of your body are particularly tense. Purposefully relax the muscles in those areas.  Download or purchase mobile phone or tablet apps (applications)  that can help you stick to your quit plan by providing reminders, tips, and encouragement. There are many free apps, such as QuitGuide from the CDC (Centers for Disease Control and Prevention). You can find other support for quitting smoking (smoking cessation) through smokefree.gov and other websites. HOW WILL I FEEL WHEN I QUIT SMOKING? Within the first 24 hours of quitting smoking, you may start to feel some withdrawal symptoms. These symptoms are usually most noticeable 2-3 days after quitting, but they usually do not last beyond 2-3 weeks. Changes or symptoms that you might experience include:  Mood swings.  Restlessness, anxiety, or irritation.  Difficulty concentrating.  Dizziness.  Strong cravings for sugary foods in addition to nicotine.  Mild weight gain.  Constipation.  Nausea.  Coughing or a sore throat.  Changes in how your medicines work in your body.  A depressed mood.  Difficulty sleeping (insomnia). After the first 2-3 weeks of quitting, you may start to notice more positive results, such as:  Improved sense of smell and taste.  Decreased coughing and sore throat.  Slower heart rate.  Lower blood pressure.  Clearer skin.  The ability to breathe more easily.  Fewer sick days. Quitting smoking is very challenging for most people. Do not get discouraged if you are not successful the first time. Some people need to make many attempts to quit before they achieve long-term success. Do your best to stick to your quit plan, and talk with your health care provider if you have any questions or concerns.   This information is not intended to replace advice given to you by your health care provider. Make sure you discuss any questions you have with your health care provider.   Document Released: 08/31/2001 Document Revised: 01/21/2015 Document Reviewed: 01/21/2015 Elsevier Interactive Patient Education 2016 Elsevier Inc.  

## 2015-12-15 NOTE — Progress Notes (Addendum)
[facility] [place] HISTORY AND PHYSICAL -PAD  History of Present Illness [name] is a 70 y.o. female patient of Dr. Trula Slade who is s/p stenting of her left superficial femoral artery in September of 2012. This alleviated her symptoms initially. 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  She has a known cerebral aneurysm followed by her neurologist who is also following her carotid arteries, has had several TIA's.   Her A1C was 8.26 September 2014 (review of records) 9.3 in February 2017; states she has been tried on multiple DM medications.  Was hospitalized January 2016 at [facility] then [facility] with loss of balance, states she was told that she did not have a stroke, was told that she was dehydrated.  She is walking better. Her medications were adjusted.   Her feet ache and feel cold all the time, she sees a podiatrist.   She no longer has claudication in both calves with walking, does report pain in left hip and thigh with walking at times , denies non-healing wounds.   Dr. Trula Slade stopped the Pletal since it did not help her claudication symptoms.  She reports arthritis pain in both knees.  She states she has stage 3 CKD.  Pt smoker: smoker (1/2 ppd x 40 yrs, is trying to quit)  Pt meds include:  Statin :Yes  ASA: Yes  Other anticoagulants/antiplatelets: allergic to Plavix     Past Medical History  Diagnosis Date  . Phlebitis     30 years ago  left leg  . Anxiety   . GERD (gastroesophageal reflux disease)   . Migraines   . Diabetes mellitus 1998  . Hyperlipidemia   . Hypertension 2000  . Stroke [facility])     multiple mini strokes ( brain aneurysm )  . Aneurysm [facility]) 2013    Right Brain   . Status post colonoscopy 07/2010  . Abnormal findings on esophagogastroduodenoscopy (EGD) 07/2010  . Peripheral vascular disease ([place])   . Hiatal hernia   .  Diverticulosis   . Hemorrhoids   . Colon polyps     Social History Social History  Substance Use Topics  . Smoking status: Current Every Day Smoker -- 0.50 packs/day for 40 years    Types: Cigarettes  . Smokeless tobacco: Never Used  . Alcohol Use: No    Family History Family History  Problem Relation Age of Onset  . CAD Mother 56    Died of MI  . Hypertension Mother   . Heart attack Mother   . Heart disease Mother   . CAD Father 54    Died of MI  . Heart disease Father   . Heart attack Father   . CAD Brother 45    Two brothers died of MI  . Heart attack Brother   . Heart disease Brother     Amputation  . Diabetes Sister   . Hypertension Sister   . Heart attack Brother   . Heart disease Brother     Past Surgical History  Procedure Laterality Date  . Cesarean section    . Cholecystectomy    . Knee surgery    . Rotator cuff surgery    . Thyroid surgery    . Hernia repair      times two  . Abdominal hysterectomy  1984  . Femoral artery stent  05/11/11    Left superficial femoral and popliteal  artery  . Dental surgery  Aug. 16, 2013    left lower   . Femoral-popliteal bypass graft  Nov. 26, 2013    Left Angiogram-   . Eye surgery  Nov. 2014    Laser-Glaucoma  . Esophagogastroduodenoscopy  07/2010  . Colonoscopy  07/2010  . Abdominal aortagram N/A 01/04/2012    Procedure: ABDOMINAL Maxcine Ham;  Surgeon: Serafina Mitchell, MD;  Location: [facility] CATH LAB;  Service: Cardiovascular;  Laterality: N/A;  . Abdominal aortagram N/A 08/15/2012    Procedure: ABDOMINAL AORTAGRAM;  Surgeon: Serafina Mitchell, MD;  Location: [facility] CATH LAB;  Service: Cardiovascular;  Laterality: N/A;  . Lower extremity angiogram Left 08/15/2012    Procedure: LOWER EXTREMITY ANGIOGRAM;  Surgeon: Serafina Mitchell, MD;  Location: [facility] CATH LAB;  Service: Cardiovascular;  Laterality: Left;  lt leg angio  . Cataract surgery Right 10-22-2015  . Cataract surgery Left 11-05-2015    Allergies  Allergen  Reactions  . Plavix [Clopidogrel Bisulfate] Palpitations  . Amoxicillin Itching and Swelling    FACE & EYES SWELL  . Ace Inhibitors   . Lyrica [Pregabalin]   . Penicillins Other (See Comments)    REACTION: unspecified    Current Outpatient Prescriptions  Medication Sig Dispense Refill  . amLODipine (NORVASC) 10 MG tablet Take 1 tablet (10 mg total) by mouth daily. 90 tablet 2  . aspirin 325 MG tablet Take 325 mg by mouth daily.     . Blood Glucose Monitoring Suppl (ACCU-CHEK AVIVA PLUS) W/DEVICE KIT Check blood sugar up to three times daily 1 kit 0  . Cholecalciferol (VITAMIN D3) 5000 UNITS TABS Take 1 capsule by mouth daily.    Marland Kitchen CINNAMON PO Take 1,000 mg by mouth 2 (two) times daily.     . citalopram (CELEXA) 20 MG tablet TAKE 1 TABLET (20 MG TOTAL) BY MOUTH DAILY. 90 tablet 0  . clonazePAM (KLONOPIN) 0.5 MG tablet TAKE 1 TABLET BY MOUTH TWICE A DAY 60 tablet 5  . glucose blood (ACCU-CHEK AVIVA PLUS) test strip Use daily to check BS TID Dx. E11.22 300 each 1  . insulin aspart protamine - aspart (NOVOLOG MIX 70/30 FLEXPEN) (70-30) 100 UNIT/ML FlexPen Inject 0.15 mLs (15 Units total) into the skin 2 (two) times daily with a meal. 30 pen 1  . Insulin Pen Needle (NOVOFINE) 32G X 6 MM MISC USE AS DIRECTED 2 TIMES A DAY 100 each prn  . IRON PO Take by mouth daily.    Marland Kitchen JANUVIA 100 MG tablet TAKE 1/2 - 1 TABLET BY MOUTH EVERY DAY FOR DIABETES, AS DIRECTED  1  . losartan (COZAAR) 100 MG tablet Take 1 tablet (100 mg total) by mouth daily. 90 tablet 2  . metFORMIN (GLUCOPHAGE-XR) 500 MG 24 hr tablet Take 2 tablets (1,000 mg total) by mouth 2 (two) times daily. 360 tablet 1  . Omega-3 Fatty Acids (FISH OIL) 1200 MG CAPS Take 1 capsule by mouth daily.    Marland Kitchen omeprazole (PRILOSEC) 40 MG capsule Take 1 capsule (40 mg total) by mouth daily. 90 capsule 3  . simvastatin (ZOCOR) 20 MG tablet TAKE 1 TABLET (20 MG TOTAL) BY MOUTH AT BEDTIME. 90 tablet 0  . vitamin E (VITAMIN E) 400 UNIT capsule Take 400  Units by mouth daily.    Marland Kitchen zinc gluconate 50 MG tablet Take 50 mg by mouth daily.    . ranitidine (ZANTAC) 300 MG tablet Take 1 tablet (300 mg total) by mouth at bedtime. (Patient not taking: Reported  on 12/15/2015) 90 tablet 1   No current facility-administered medications for this visit.    ROS: See HPI for pertinent positives and negatives.   Physical Examination  Filed Vitals:   12/15/15 1159 12/15/15 1214  BP: 143/72 126/74  Pulse: 74   Temp: 98.3 F (36.8 C)   TempSrc: Oral   Resp: 18   Height: 5' 6.75" (1.695 m)   Weight: 170 lb 9.6 oz (77.384 kg)   SpO2: 95%    Body mass index is 26.93 kg/(m^2).    Not General: A&O x 3, WDWN,  Gait: normal  Eyes: PERRLA  Pulmonary: CTAB, without wheezes , rales or rhonchi  Cardiac: regular rythm, no detected murmur   Carotid Bruits  Left  Right    Negative  positive    Aorta: is not palpable  Radial pulses: are 2+ and palpable  VASCULAR EXAM:  Extremities without ischemic changes  without Gangrene; without open wounds.   LE Pulses  LEFT  RIGHT   FEMORAL  palpable  palpable   POPLITEAL  not palpable  not palpable   POSTERIOR TIBIAL  not palpable  Not palpable   DORSALIS PEDIS  ANTERIOR TIBIAL  not palpable  not palpable          Abdomen: soft, non tender, no palpable masses.  Skin: no rashes, no ulcers.  Musculoskeletal: no muscle wasting or atrophy.  Neurologic: A&O X 3; Appropriate Affect ; SENSATION: normal; MOTOR FUNCTION: moving all extremities equally, motor strength 4/5 in UE's, 4/5 in LE's. Speech is fluent/normal. CN 2-12 intact.         Non-Invasive Vascular Imaging: DATE: 12/15/2015 ABI: RIGHT: 0.67 (0.68, 11/11/14), Waveforms: monophasic;  LEFT: 0.55 (0.63), Waveforms: monophasic   ASSESSMENT: [name] is a 70 y.o. female who is s/p stenting of her left superficial femoral artery in September of 2012. This alleviated her symptoms  initially. 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  She has a known cerebral aneurysm followed by her neurologist who is also following her carotid arteries, has had several TIA's. She no longer seems to have claudication in her calves with walking, does have intermittent pain in her left hip and thigh with walking, has no signs of ischemia in her feet/legs.  ABI's remain stable compared to a year ago, moderate bilateral arterial occlusive disease, all monophasic waveforms.   Her atherosclerotic risk factors include uncontrolled DM and active smoking; see Plan.   PLAN:  The patient was counseled re smoking cessation and given several free resources re smoking cessation.   Based on the patient's vascular studies and examination, pt will return to clinic in 1 year with ABI's and see NP, she knows to return sooner if needed.   I discussed in depth with the patient the nature of atherosclerosis, and emphasized the importance of maximal medical management including strict control of blood pressure, blood glucose, and lipid levels, obtaining regular exercise, and cessation of smoking.  The patient is aware that without maximal medical management the underlying atherosclerotic disease process will progress, limiting the benefit of any interventions.  The patient was given information about PAD including signs, symptoms, treatment, what symptoms should prompt the patient to seek immediate medical care, and risk reduction measures to take.  Clemon Chambers, RN, MSN, FNP-C [facility] Phone: [phone]  Clinic MD: Trula Slade  12/15/2015 12:27 PM

## 2015-12-19 ENCOUNTER — Encounter: Payer: Self-pay | Admitting: Internal Medicine

## 2015-12-19 ENCOUNTER — Ambulatory Visit (INDEPENDENT_AMBULATORY_CARE_PROVIDER_SITE_OTHER): Payer: Medicare Other | Admitting: Internal Medicine

## 2015-12-19 VITALS — BP 120/60 | HR 72 | Ht 66.75 in | Wt 173.0 lb

## 2015-12-19 DIAGNOSIS — K219 Gastro-esophageal reflux disease without esophagitis: Secondary | ICD-10-CM | POA: Diagnosis not present

## 2015-12-19 DIAGNOSIS — R110 Nausea: Secondary | ICD-10-CM

## 2015-12-19 DIAGNOSIS — Z1211 Encounter for screening for malignant neoplasm of colon: Secondary | ICD-10-CM | POA: Diagnosis not present

## 2015-12-19 DIAGNOSIS — R109 Unspecified abdominal pain: Secondary | ICD-10-CM | POA: Diagnosis not present

## 2015-12-19 NOTE — Progress Notes (Signed)
HISTORY OF PRESENT ILLNESS:  [name] is a 70 y.o. female with greater than 15 year history of diabetes, hypertension, hyperlipidemia, cerebrovascular disease, anxiety/depression, GERD, and multiple prior abdominal and pelvic surgeries including ventral hernia repair 3 (most recent February 2010). She sent today by her PCPs office [name]) regarding chronic abdominal discomfort, management of chronic GERD, and questioning the need for endoscopic procedures. First, patient reports generalized abdominal soreness since her last hernia surgery 7 years ago. This is never severe but always present. No obvious exacerbating or relieving factors. This does not interfere with activities, rest, or meals. She has had no recent weight loss (actually weight gain). Her bowel habits have been regular and she denies rectal bleeding. Her diabetes has been under suboptimal control with last hemoglobin A1c greater than 9. In terms of GERD, she takes omeprazole daily. She was having problems with morning nausea for which ranitidine on demand was recommended. She feels that this helps. There has been no vomiting. She is on multiple medications as listed. She denies dysphagia. No heartburn with current regimen. Review of outside laboratories from February 2017 shows unremarkable comprehensive metabolic panel and CBC except for elevated glucose. Normal TSH. Her last complete colonoscopy was performed November 2011. No polyps. Index examination 2004 with hyperplastic polyps only. Follow-up in 10 years recommended. Her last upper endoscopy in November 2011 to evaluate abdominal pain and nausea was normal. There has been no interval family history of colon cancer.  REVIEW OF SYSTEMS:  All non-GI ROS negative except for anxiety, arthritis, cataract,  Past Medical History  Diagnosis Date  . Phlebitis     30 years ago  left leg  . Anxiety   . GERD (gastroesophageal reflux disease)   . Migraines   . Diabetes mellitus 1998   . Hyperlipidemia   . Hypertension 2000  . Stroke [facility])     multiple mini strokes ( brain aneurysm )  . 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 Status post colonoscopy 07/2010  . Abnormal findings on esophagogastroduodenoscopy (EGD) 07/2010  . Peripheral vascular disease ([place])   . Hiatal hernia   . Diverticulosis   . Hemorrhoids   . Colon polyps     Past Surgical History  Procedure Laterality Date  . Cesarean section    . Cholecystectomy    . Knee surgery    . Rotator cuff surgery    . Thyroid surgery    . Hernia repair      times two  . Abdominal hysterectomy  1984  . Femoral artery stent  05/11/11    Left superficial femoral and popliteal artery  . Dental surgery  Aug. 16, 2013    left lower   . Femoral-popliteal bypass graft  Nov. 26, 2013    Left Angiogram-   . Eye surgery  Nov. 2014    Laser-Glaucoma  . Esophagogastroduodenoscopy  07/2010  . Colonoscopy  07/2010  . Abdominal aortagram N/A 01/04/2012    Procedure: ABDOMINAL Maxcine Ham;  Surgeon: Serafina Mitchell, MD;  Location: [facility] CATH LAB;  Service: Cardiovascular;  Laterality: N/A;  . Abdominal aortagram N/A 08/15/2012    Procedure: ABDOMINAL AORTAGRAM;  Surgeon: Serafina Mitchell, MD;  Location: [facility] CATH LAB;  Service: Cardiovascular;  Laterality: N/A;  . Lower extremity angiogram Left 08/15/2012    Procedure: LOWER EXTREMITY ANGIOGRAM;  Surgeon: Serafina Mitchell, MD;  Location: [facility] CATH LAB;  Service: Cardiovascular;  Laterality: Left;  lt leg angio  . Cataract surgery Right  10-22-2015  . Cataract surgery Left 11-05-2015    Social History [name]  reports that she has been smoking Cigarettes.  She has a 20 pack-year smoking history. She has never used smokeless tobacco. She reports that she does not drink alcohol or use illicit drugs.  family history includes CAD (age of onset: 43) in her brother; CAD (age of onset: 20) in her father and mother; Diabetes in her sister; Heart attack in her brother, brother,  father, and mother; Heart disease in her brother, brother, father, and mother; Hypertension in her mother and sister.  Allergies  Allergen Reactions  . Plavix [Clopidogrel Bisulfate] Palpitations  . Amoxicillin Itching and Swelling    FACE & EYES SWELL  . Ace Inhibitors   . Lyrica [Pregabalin]   . Penicillins Other (See Comments)    REACTION: unspecified       PHYSICAL EXAMINATION: Vital signs: BP 120/60 mmHg  Pulse 72  Ht 5' 6.75" (1.695 m)  Wt 173 lb (78.472 kg)  BMI 27.31 kg/m2  Constitutional: Pleasant, generally well-appearing, no acute distress Psychiatric: alert and oriented x3, cooperative Eyes: extraocular movements intact, anicteric, conjunctiva pink Mouth: oral pharynx moist, no lesions Neck: supple no lymphadenopathy Cardiovascular: heart regular rate and rhythm, no murmur Lungs: clear to auscultation bilaterally Abdomen: soft, nontender, nondistended, no obvious ascites, no peritoneal signs, normal bowel sounds, no organomegaly. Multiple prior surgical incisions well-healed Rectal: Deferred Extremities: no clubbing cyanosis or lower extremity edema bilaterally Skin: no lesions on visible extremities Neuro: No focal deficits. Normal DTRs. Cranial nerves intact   ASSESSMENT:  #1. Chronic abdominal pain. Ongoing. No worrisome features. This most likely due to adhesions in the presence of mesh. We discussed this. This does not interfere with her lifestyle. Reassurance provided #2. Morning nausea. Likely related to suboptimally controlled diabetes and/or medications. Vomiting. Taking ranitidine on demand which she states helps #3. GERD. No classic symptoms on PPI #4. Colon cancer screening. Current guidelines reviewed. Due for follow-up until 2021 unless otherwise clinically indicated. No clinical indication currently.   PLAN:  #1. Reflux precautions #2. Continue PPI and as needed H2 receptor antagonist #3. Importance of good diabetic control stressed #4. Repeat  colon cancer screening 2021. #5. Return to the care of her PCP      30 minutes was spent face-to-face with the patient. Greater than 50% a time use for counseling regarding her GI issues as outlined and answering questions

## 2015-12-19 NOTE — Patient Instructions (Signed)
Please follow up with Dr Henrene Pastor as needed.  If you are age 69 or older, your body mass index should be between 23-30. Your Body mass index is 27.31 kg/(m^2). If this is out of the aforementioned range listed, please consider follow up with your Primary Care Provider.  If you are age 67 or younger, your body mass index should be between 19-25. Your Body mass index is 27.31 kg/(m^2). If this is out of the aformentioned range listed, please consider follow up with your Primary Care Provider.

## 2016-01-13 ENCOUNTER — Other Ambulatory Visit: Payer: Self-pay | Admitting: Internal Medicine

## 2016-01-16 ENCOUNTER — Encounter: Payer: Self-pay | Admitting: Podiatry

## 2016-01-16 ENCOUNTER — Ambulatory Visit (INDEPENDENT_AMBULATORY_CARE_PROVIDER_SITE_OTHER): Payer: Medicare Other | Admitting: Podiatry

## 2016-01-16 DIAGNOSIS — I739 Peripheral vascular disease, unspecified: Secondary | ICD-10-CM | POA: Diagnosis not present

## 2016-01-16 DIAGNOSIS — B351 Tinea unguium: Secondary | ICD-10-CM

## 2016-01-16 DIAGNOSIS — M79676 Pain in unspecified toe(s): Secondary | ICD-10-CM | POA: Diagnosis not present

## 2016-01-16 DIAGNOSIS — E1149 Type 2 diabetes mellitus with other diabetic neurological complication: Secondary | ICD-10-CM

## 2016-01-16 NOTE — Progress Notes (Signed)
Patient ID: CATHERN CROTTS, female   DOB: 1946-03-06, 70 y.o.   MRN: [id number] Complaint:  Visit Type: Patient returns to my office for continued preventative foot care services. Complaint: Patient states" my nails have grown long and thick and become painful to walk and wear shoes" Patient has been diagnosed with DM with neuropathy.. The patient presents for preventative foot care services. No changes to ROS  Podiatric Exam: Vascular: dorsalis pedis and posterior tibial pulses are palpable bilateral. Capillary return is immediate. Temperature gradient is WNL. Skin turgor WNL  Sensorium: Diminished Semmes Weinstein monofilament test. Normal tactile sensation bilaterally. Nail Exam: Pt has thick disfigured discolored nails with subungual debris noted bilateral entire nail hallux through fifth toenails Ulcer Exam: There is no evidence of ulcer or pre-ulcerative changes or infection. Orthopedic Exam: Muscle tone and strength are WNL. No limitations in general ROM. No crepitus or effusions noted. Foot type and digits show no abnormalities. Bony prominences are unremarkable. Hammer toes B/L with plantarflexed metatarsals B/L Skin: No Porokeratosis. No infection or ulcers  Diagnosis:  Onychomycosis, , Pain in right toe, pain in left toes  Treatment & Plan Procedures and Treatment: Consent by patient was obtained for treatment procedures. The patient understood the discussion of treatment and procedures well. All questions were answered thoroughly reviewed. Debridement of mycotic and hypertrophic toenails, 1 through 5 bilateral and clearing of subungual debris. No ulceration, no infection noted. Dispense padding. Return Visit-Office Procedure: Patient instructed to return to the office for a follow up visit 3 months for continued evaluation and treatment.    Gardiner Barefoot DPM

## 2016-02-02 ENCOUNTER — Encounter: Payer: Self-pay | Admitting: Physician Assistant

## 2016-02-02 ENCOUNTER — Ambulatory Visit (INDEPENDENT_AMBULATORY_CARE_PROVIDER_SITE_OTHER): Payer: Medicare Other | Admitting: Physician Assistant

## 2016-02-02 VITALS — BP 118/66 | HR 79 | Temp 97.7°F | Resp 16 | Ht 66.5 in | Wt 173.2 lb

## 2016-02-02 DIAGNOSIS — E559 Vitamin D deficiency, unspecified: Secondary | ICD-10-CM

## 2016-02-02 DIAGNOSIS — E1129 Type 2 diabetes mellitus with other diabetic kidney complication: Secondary | ICD-10-CM | POA: Diagnosis not present

## 2016-02-02 DIAGNOSIS — F411 Generalized anxiety disorder: Secondary | ICD-10-CM

## 2016-02-02 DIAGNOSIS — E785 Hyperlipidemia, unspecified: Secondary | ICD-10-CM | POA: Diagnosis not present

## 2016-02-02 DIAGNOSIS — E1122 Type 2 diabetes mellitus with diabetic chronic kidney disease: Secondary | ICD-10-CM

## 2016-02-02 DIAGNOSIS — Z79899 Other long term (current) drug therapy: Secondary | ICD-10-CM | POA: Diagnosis not present

## 2016-02-02 DIAGNOSIS — N183 Chronic kidney disease, stage 3 (moderate): Secondary | ICD-10-CM

## 2016-02-02 DIAGNOSIS — I10 Essential (primary) hypertension: Secondary | ICD-10-CM

## 2016-02-02 DIAGNOSIS — Z794 Long term (current) use of insulin: Secondary | ICD-10-CM | POA: Diagnosis not present

## 2016-02-02 DIAGNOSIS — E119 Type 2 diabetes mellitus without complications: Secondary | ICD-10-CM

## 2016-02-02 DIAGNOSIS — Z720 Tobacco use: Secondary | ICD-10-CM

## 2016-02-02 LAB — CBC WITH DIFFERENTIAL/PLATELET
BASOS ABS: 85 {cells}/uL (ref 0–200)
Basophils Relative: 1 %
EOS ABS: 85 {cells}/uL (ref 15–500)
EOS PCT: 1 %
HCT: 45.1 % — ABNORMAL HIGH (ref 35.0–45.0)
HEMOGLOBIN: 14.9 g/dL (ref 11.7–15.5)
Lymphocytes Relative: 29 %
Lymphs Abs: 2465 cells/uL (ref 850–3900)
MCH: 31.4 pg (ref 27.0–33.0)
MCHC: 33 g/dL (ref 32.0–36.0)
MCV: 94.9 fL (ref 80.0–100.0)
MONOS PCT: 5 %
MPV: 10.3 fL (ref 7.5–12.5)
Monocytes Absolute: 425 cells/uL (ref 200–950)
NEUTROS ABS: 5440 {cells}/uL (ref 1500–7800)
NEUTROS PCT: 64 %
PLATELETS: 303 10*3/uL (ref 140–400)
RBC: 4.75 MIL/uL (ref 3.80–5.10)
RDW: 14.2 % (ref 11.0–15.0)
WBC: 8.5 10*3/uL (ref 3.8–10.8)

## 2016-02-02 LAB — HEPATIC FUNCTION PANEL
ALBUMIN: 3.9 g/dL (ref 3.6–5.1)
ALK PHOS: 78 U/L (ref 33–130)
ALT: 19 U/L (ref 6–29)
AST: 22 U/L (ref 10–35)
Bilirubin, Direct: 0.1 mg/dL (ref <=0.2)
Indirect Bilirubin: 0.3 mg/dL (ref 0.2–1.2)
TOTAL PROTEIN: 6.8 g/dL (ref 6.1–8.1)
Total Bilirubin: 0.4 mg/dL (ref 0.2–1.2)

## 2016-02-02 LAB — BASIC METABOLIC PANEL WITH GFR
BUN: 18 mg/dL (ref 7–25)
CALCIUM: 9.4 mg/dL (ref 8.6–10.4)
CHLORIDE: 99 mmol/L (ref 98–110)
CO2: 28 mmol/L (ref 20–31)
CREATININE: 1.03 mg/dL — AB (ref 0.50–0.99)
GFR, Est African American: 64 mL/min (ref >=60)
GFR, Est Non African American: 56 mL/min — ABNORMAL LOW (ref >=60)
Glucose, Bld: 223 mg/dL — ABNORMAL HIGH (ref 65–99)
Potassium: 4.5 mmol/L (ref 3.5–5.3)
SODIUM: 139 mmol/L (ref 135–146)

## 2016-02-02 LAB — LIPID PANEL
CHOLESTEROL: 167 mg/dL (ref 125–200)
HDL: 50 mg/dL (ref >=46)
LDL Cholesterol: 99 mg/dL (ref <130)
Total CHOL/HDL Ratio: 3.3 Ratio (ref <=5.0)
Triglycerides: 88 mg/dL (ref <150)
VLDL: 18 mg/dL (ref <30)

## 2016-02-02 LAB — MAGNESIUM: MAGNESIUM: 1.7 mg/dL (ref 1.5–2.5)

## 2016-02-02 LAB — TSH: TSH: 1.83 mIU/L

## 2016-02-02 MED ORDER — LOSARTAN POTASSIUM 100 MG PO TABS
100.0000 mg | ORAL_TABLET | Freq: Every day | ORAL | Status: DC
Start: 2016-02-02 — End: 2016-08-09

## 2016-02-02 MED ORDER — METFORMIN HCL ER 500 MG PO TB24: 1000.0000 mg | ORAL_TABLET | Freq: Two times a day (BID) | ORAL | Status: DC

## 2016-02-02 MED ORDER — SIMVASTATIN 20 MG PO TABS: ORAL_TABLET | ORAL | Status: DC

## 2016-02-02 MED ORDER — AMLODIPINE BESYLATE 10 MG PO TABS: 10.0000 mg | ORAL_TABLET | Freq: Every day | ORAL | Status: DC

## 2016-02-02 MED ORDER — CITALOPRAM HYDROBROMIDE 20 MG PO TABS: ORAL_TABLET | ORAL | Status: DC

## 2016-02-02 MED ORDER — JANUVIA 100 MG PO TABS: ORAL_TABLET | ORAL | Status: DC

## 2016-02-02 MED ORDER — CLONAZEPAM 0.5 MG PO TABS: 0.5000 mg | ORAL_TABLET | Freq: Two times a day (BID) | ORAL | Status: DC

## 2016-02-02 NOTE — Patient Instructions (Addendum)
Please pick one of the over the counter allergy medications below and take it once daily for allergies.  Claritin or loratadine cheapest but likely the weakest  Zyrtec or certizine at night because it can make you sleepy The strongest is allegra or fexafinadine  Cheapest at [employer], [employer], [employer]  EAT MORE VEGGIES!!  Somogyi effect The brain needs two things: oxygen and sugar. If the blood sugar level drops too low in the early morning hours, hormones (such as growth hormone, cortisol, and catecholamines) are released to make sure you brain can still function. These help reverse the low blood sugar level but may lead to blood sugar levels that are higher than normal in the morning. This is common for patient that take insulin at night or do not ear regular snacks.  Please schedule to get up in the middle of the night to check your blood sugar.   This may be happening to you. Please eat a high protein night time snack and we will be decreasing your night time insulin as follows:   If your morning sugar is always below 100 then the issue is with your sugar spiking after meals. Try to take your blood sugar approximately 2 hours after eating, this number should be less than 200. If it is not, think about the foods that you ate and better choices you can make.    Your A1C is a measure of your sugar over the past 3 months and is not affected by what you have eaten over the past few days. Diabetes increases your chances of stroke and heart attack over 300 % and is the leading cause of blindness and kidney failure in the [place]. Please make sure you decrease bad carbs like white bread, white rice, potatoes, corn, soft drinks, pasta, cereals, refined sugars, sweet tea, dried fruits, and fruit juice. Good carbs are okay to eat in moderation like sweet potatoes, brown rice, whole grain pasta/bread, most fruit (except dried fruit) and you can eat as many veggies as you want.   Greater than 6.5 is  considered diabetic. Between 6.4 and 5.7 is prediabetic If your A1C is less than 5.7 you are NOT diabetic.  Targets for Glucose Readings: Time of Check Target for patients WITHOUT Diabetes Target for DIABETICS  Before Meals Less than 100  less than 150  Two hours after meals Less than 200  Less than 250       Bad carbs also include fruit juice, alcohol, and sweet tea. These are empty calories that do not signal to your brain that you are full.   Please remember the good carbs are still carbs which convert into sugar. So please measure them out no more than 1/2-1 cup of rice, oatmeal, pasta, and beans  Veggies are however free foods! Pile them on.   Not all fruit is created equal. Please see the list below, the fruit at the bottom is higher in sugars than the fruit at the top. Please avoid all dried fruits.

## 2016-02-02 NOTE — Progress Notes (Signed)
Patient ID: [name], female   DOB: November 11, 1945, 70 y.o.   MRN: [id number]  Assessment and Plan:  Hypertension:  -Continue medication -monitor blood pressure at home. -Continue DASH diet -Reminder to go to the ER if any CP, SOB, nausea, dizziness, severe HA, changes vision/speech, left arm numbness and tingling and jaw pain.  Cholesterol - Continue diet and exercise -Check cholesterol.   Diabetes with diabetic chronic kidney disease  -advised to keep food diary -increase exercise - Increase veggies -Continue diet and exercise.  -Check A1C  Vitamin D Def -check level -continue medications.     Continue diet and meds as discussed. Further disposition pending results of labs. Discussed med's effects and SE's.    HPI 70 y.o. female  presents for 3 month follow up with hypertension, hyperlipidemia, diabetes and vitamin D deficiency.  Her blood pressure has been controlled at home, today their BP is BP: 118/66 mmHg.She does not workout. She denies chest pain, shortness of breath, dizziness.  She is on cholesterol medication and denies myalgias. Her cholesterol is at goal. The cholesterol was:  11/07/2015: Cholesterol 161; HDL 50; LDL Cholesterol 91; Triglycerides 99  She has been working on diet and exercise for diabetes with diabetic chronic kidney disease, with other circulatory complications and with diabetic polyneuropathy, she is on ASA, she is on ACE/ARB, she is on 15 units 70/30 mix BID, januvia and MF, could not tolerate invokana due to CKD and denies  foot ulcerations, hypoglycemia , increased appetite, nausea, paresthesia of the feet, polydipsia, polyuria, visual disturbances, vomiting and weight loss. Last A1C was: 11/07/2015: Hgb A1c MFr Bld 9.3*.  Lab Results  Component Value Date   GFRAA 61 11/07/2015  Patient is on Vitamin D supplement. 11/07/2015: Vit D, 25-Hydroxy 26* BMI is Body mass index is 27.54 kg/(m^2)., she is working on diet and exercise. Wt Readings from Last  3 Encounters:  02/02/16 173 lb 3.2 oz (78.563 kg)  12/19/15 173 lb (78.472 kg)  12/15/15 170 lb 9.6 oz (77.384 kg)    Current Medications:  Current Outpatient Prescriptions on File Prior to Visit  Medication Sig Dispense Refill  . amLODipine (NORVASC) 10 MG tablet Take 1 tablet (10 mg total) by mouth daily. 90 tablet 2  . aspirin 325 MG tablet Take 325 mg by mouth daily.     . Blood Glucose Monitoring Suppl (ACCU-CHEK AVIVA PLUS) W/DEVICE KIT Check blood sugar up to three times daily 1 kit 0  . Cholecalciferol (VITAMIN D3) 5000 UNITS TABS Take 1 capsule by mouth daily.    Marland Kitchen CINNAMON PO Take 1,000 mg by mouth 2 (two) times daily.     . citalopram (CELEXA) 20 MG tablet TAKE 1 TABLET (20 MG TOTAL) BY MOUTH DAILY. 90 tablet 0  . clonazePAM (KLONOPIN) 0.5 MG tablet TAKE 1 TABLET BY MOUTH TWICE A DAY 60 tablet 5  . glucose blood (ACCU-CHEK AVIVA PLUS) test strip Use daily to check BS TID Dx. E11.22 300 each 1  . insulin aspart protamine - aspart (NOVOLOG MIX 70/30 FLEXPEN) (70-30) 100 UNIT/ML FlexPen Inject 0.15 mLs (15 Units total) into the skin 2 (two) times daily with a meal. 30 pen 1  . Insulin Pen Needle (NOVOFINE) 32G X 6 MM MISC USE AS DIRECTED 2 TIMES A DAY 100 each prn  . IRON PO Take by mouth daily.    Marland Kitchen JANUVIA 100 MG tablet TAKE 1/2 - 1 TABLET BY MOUTH EVERY DAY FOR DIABETES, AS DIRECTED  1  . losartan (  COZAAR) 100 MG tablet Take 1 tablet (100 mg total) by mouth daily. 90 tablet 2  . Magnesium 500 MG CAPS Take 1 capsule by mouth daily.    . metFORMIN (GLUCOPHAGE-XR) 500 MG 24 hr tablet Take 2 tablets (1,000 mg total) by mouth 2 (two) times daily. 360 tablet 1  . Omega-3 Fatty Acids (FISH OIL) 1200 MG CAPS Take 1 capsule by mouth daily.    . simvastatin (ZOCOR) 20 MG tablet TAKE 1 TABLET (20 MG TOTAL) BY MOUTH AT BEDTIME. 90 tablet 0  . vitamin E (VITAMIN E) 400 UNIT capsule Take 400 Units by mouth daily.    Marland Kitchen zinc gluconate 50 MG tablet Take 50 mg by mouth daily.     No current  facility-administered medications on file prior to visit.   Medical History:  Past Medical History  Diagnosis Date  . Phlebitis     30 years ago  left leg  . Anxiety   . GERD (gastroesophageal reflux disease)   . Migraines   . Diabetes mellitus 1998  . Hyperlipidemia   . Hypertension 2000  . Stroke [facility])     multiple mini strokes ( brain aneurysm )  . 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 Status post colonoscopy 07/2010  . Abnormal findings on esophagogastroduodenoscopy (EGD) 07/2010  . Peripheral vascular disease ([place])   . Hiatal hernia   . Diverticulosis   . Hemorrhoids   . Colon polyps    Allergies:  Allergies  Allergen Reactions  . Plavix [Clopidogrel Bisulfate] Palpitations  . Amoxicillin Itching and Swelling    FACE & EYES SWELL  . Ace Inhibitors   . Lyrica [Pregabalin]   . Penicillins Other (See Comments)    REACTION: unspecified     Review of Systems:  Review of Systems  Constitutional: Negative for fever, chills and malaise/fatigue.  HENT: Negative for congestion, ear pain and sore throat.   Eyes: Negative.   Respiratory: Negative for cough, shortness of breath and wheezing.   Cardiovascular: Negative for chest pain, palpitations and leg swelling.  Gastrointestinal: Negative for heartburn, abdominal pain, diarrhea, constipation, blood in stool and melena.  Genitourinary: Negative.   Skin: Negative.   Neurological: Negative for dizziness, sensory change, loss of consciousness and headaches.  Psychiatric/Behavioral: Negative for depression. The patient is not nervous/anxious and does not have insomnia.     Family history- Review and unchanged  Social history- Review and unchanged  Physical Exam: BP 118/66 mmHg  Pulse 79  Temp(Src) 97.7 F (36.5 C) (Temporal)  Resp 16  Ht 5' 6.5" (1.689 m)  Wt 173 lb 3.2 oz (78.563 kg)  BMI 27.54 kg/m2  SpO2 95% Wt Readings from Last 3 Encounters:  02/02/16 173 lb 3.2 oz (78.563 kg)  12/19/15 173 lb (78.472  kg)  12/15/15 170 lb 9.6 oz (77.384 kg)   General Appearance: Well nourished well developed, non-toxic appearing, in no apparent distress. Eyes: PERRLA, EOMs, conjunctiva no swelling or erythema ENT/Mouth: Ear canals clear with no erythema, swelling, or discharge.  TMs normal bilaterally, oropharynx clear, moist, with no exudate.   Neck: Supple, thyroid normal, no JVD, no cervical adenopathy.  Respiratory: Respiratory effort normal, breath sounds clear A&P, no wheeze, rhonchi or rales noted.  No retractions, no accessory muscle usage Cardio: RRR with no MRGs. No noted edema.  Abdomen: Soft, + BS.  Non tender, no guarding, rebound, hernias, masses. Musculoskeletal: Full ROM, 5/5 strength, Normal gait Skin: Warm, dry without rashes, lesions, ecchymosis.  Neuro: Awake and oriented X 3, Cranial nerves intact. No cerebellar symptoms.  Psych: normal affect, Insight and Judgment appropriate.    [name], PA-C 9:06 AM [facility]

## 2016-02-03 LAB — HEMOGLOBIN A1C
HEMOGLOBIN A1C: 9.5 % — AB (ref <5.7)
MEAN PLASMA GLUCOSE: 226 mg/dL

## 2016-02-23 ENCOUNTER — Other Ambulatory Visit: Payer: Self-pay | Admitting: Physician Assistant

## 2016-02-23 ENCOUNTER — Other Ambulatory Visit: Payer: Self-pay | Admitting: *Deleted

## 2016-02-23 MED ORDER — "INSULIN SYRINGE-NEEDLE U-100 31G X 15/64"" 0.5 ML MISC": Status: DC

## 2016-02-23 MED ORDER — INSULIN NPH ISOPHANE & REGULAR (70-30) 100 UNIT/ML ~~LOC~~ SUSP: SUBCUTANEOUS | Status: DC

## 2016-02-26 ENCOUNTER — Other Ambulatory Visit: Payer: Self-pay | Admitting: Physician Assistant

## 2016-02-26 MED ORDER — INSULIN NPH ISOPHANE & REGULAR (70-30) 100 UNIT/ML ~~LOC~~ SUSP: SUBCUTANEOUS | Status: DC

## 2016-04-08 DIAGNOSIS — H538 Other visual disturbances: Secondary | ICD-10-CM | POA: Diagnosis not present

## 2016-04-08 DIAGNOSIS — H26491 Other secondary cataract, right eye: Secondary | ICD-10-CM | POA: Diagnosis not present

## 2016-04-08 DIAGNOSIS — Z961 Presence of intraocular lens: Secondary | ICD-10-CM | POA: Diagnosis not present

## 2016-04-16 ENCOUNTER — Ambulatory Visit: Payer: Medicare Other | Admitting: Podiatry

## 2016-04-29 ENCOUNTER — Other Ambulatory Visit: Payer: Self-pay | Admitting: Physician Assistant

## 2016-05-13 ENCOUNTER — Other Ambulatory Visit: Payer: Self-pay | Admitting: Physician Assistant

## 2016-06-11 DIAGNOSIS — H40023 Open angle with borderline findings, high risk, bilateral: Secondary | ICD-10-CM | POA: Diagnosis not present

## 2016-06-11 DIAGNOSIS — Z961 Presence of intraocular lens: Secondary | ICD-10-CM | POA: Diagnosis not present

## 2016-06-11 DIAGNOSIS — E113291 Type 2 diabetes mellitus with mild nonproliferative diabetic retinopathy without macular edema, right eye: Secondary | ICD-10-CM | POA: Diagnosis not present

## 2016-06-11 DIAGNOSIS — E113292 Type 2 diabetes mellitus with mild nonproliferative diabetic retinopathy without macular edema, left eye: Secondary | ICD-10-CM | POA: Diagnosis not present

## 2016-06-11 DIAGNOSIS — H26491 Other secondary cataract, right eye: Secondary | ICD-10-CM | POA: Diagnosis not present

## 2016-06-21 ENCOUNTER — Encounter: Payer: Self-pay | Admitting: Internal Medicine

## 2016-07-19 ENCOUNTER — Emergency Department (HOSPITAL_COMMUNITY)
Admission: EM | Admit: 2016-07-19 | Discharge: 2016-07-19 | Disposition: A | Discharge status: Home / Self Care | Payer: Medicare Other | Attending: Emergency Medicine | Admitting: Emergency Medicine

## 2016-07-19 ENCOUNTER — Emergency Department (HOSPITAL_COMMUNITY): Payer: Medicare Other

## 2016-07-19 ENCOUNTER — Encounter (HOSPITAL_COMMUNITY): Payer: Self-pay | Admitting: *Deleted

## 2016-07-19 DIAGNOSIS — N183 Chronic kidney disease, stage 3 (moderate): Secondary | ICD-10-CM | POA: Diagnosis not present

## 2016-07-19 DIAGNOSIS — I129 Hypertensive chronic kidney disease with stage 1 through stage 4 chronic kidney disease, or unspecified chronic kidney disease: Secondary | ICD-10-CM | POA: Insufficient documentation

## 2016-07-19 DIAGNOSIS — Z8673 Personal history of transient ischemic attack (TIA), and cerebral infarction without residual deficits: Secondary | ICD-10-CM | POA: Diagnosis not present

## 2016-07-19 DIAGNOSIS — M542 Cervicalgia: Secondary | ICD-10-CM | POA: Diagnosis not present

## 2016-07-19 DIAGNOSIS — Z7984 Long term (current) use of oral hypoglycemic drugs: Secondary | ICD-10-CM | POA: Diagnosis not present

## 2016-07-19 DIAGNOSIS — F17210 Nicotine dependence, cigarettes, uncomplicated: Secondary | ICD-10-CM | POA: Diagnosis not present

## 2016-07-19 DIAGNOSIS — Z7982 Long term (current) use of aspirin: Secondary | ICD-10-CM | POA: Insufficient documentation

## 2016-07-19 DIAGNOSIS — E1122 Type 2 diabetes mellitus with diabetic chronic kidney disease: Secondary | ICD-10-CM | POA: Diagnosis not present

## 2016-07-19 DIAGNOSIS — Z794 Long term (current) use of insulin: Secondary | ICD-10-CM | POA: Insufficient documentation

## 2016-07-19 DIAGNOSIS — R51 Headache: Secondary | ICD-10-CM | POA: Insufficient documentation

## 2016-07-19 DIAGNOSIS — I6523 Occlusion and stenosis of bilateral carotid arteries: Secondary | ICD-10-CM | POA: Diagnosis not present

## 2016-07-19 DIAGNOSIS — R519 Headache, unspecified: Secondary | ICD-10-CM

## 2016-07-19 LAB — COMPREHENSIVE METABOLIC PANEL
ALT: 14 U/L (ref 14–54)
AST: 26 U/L (ref 15–41)
Albumin: 3.1 g/dL — ABNORMAL LOW (ref 3.5–5.0)
Alkaline Phosphatase: 89 U/L (ref 38–126)
Anion gap: 8 (ref 5–15)
BUN: 18 mg/dL (ref 6–20)
CHLORIDE: 103 mmol/L (ref 101–111)
CO2: 26 mmol/L (ref 22–32)
Calcium: 8.8 mg/dL — ABNORMAL LOW (ref 8.9–10.3)
Creatinine, Ser: 1 mg/dL (ref 0.44–1.00)
GFR, EST NON AFRICAN AMERICAN: 56 mL/min — AB (ref >60)
Glucose, Bld: 275 mg/dL — ABNORMAL HIGH (ref 65–99)
POTASSIUM: 4.7 mmol/L (ref 3.5–5.1)
SODIUM: 137 mmol/L (ref 135–145)
Total Bilirubin: 1.4 mg/dL — ABNORMAL HIGH (ref 0.3–1.2)
Total Protein: 6.1 g/dL — ABNORMAL LOW (ref 6.5–8.1)

## 2016-07-19 LAB — I-STAT CHEM 8, ED
BUN: 27 mg/dL — AB (ref 6–20)
CHLORIDE: 100 mmol/L — AB (ref 101–111)
Calcium, Ion: 1.08 mmol/L — ABNORMAL LOW (ref 1.15–1.40)
Creatinine, Ser: 1.1 mg/dL — ABNORMAL HIGH (ref 0.44–1.00)
Glucose, Bld: 262 mg/dL — ABNORMAL HIGH (ref 65–99)
HEMATOCRIT: 43 % (ref 36.0–46.0)
Hemoglobin: 14.6 g/dL (ref 12.0–15.0)
POTASSIUM: 4.7 mmol/L (ref 3.5–5.1)
SODIUM: 138 mmol/L (ref 135–145)
TCO2: 30 mmol/L (ref 0–100)

## 2016-07-19 LAB — CBC WITH DIFFERENTIAL/PLATELET
Basophils Absolute: 0.1 10*3/uL (ref 0.0–0.1)
Basophils Relative: 0 %
EOS ABS: 0.1 10*3/uL (ref 0.0–0.7)
EOS PCT: 1 %
HCT: 42.6 % (ref 36.0–46.0)
Hemoglobin: 14.4 g/dL (ref 12.0–15.0)
LYMPHS ABS: 3.4 10*3/uL (ref 0.7–4.0)
Lymphocytes Relative: 29 %
MCH: 31.6 pg (ref 26.0–34.0)
MCHC: 33.8 g/dL (ref 30.0–36.0)
MCV: 93.4 fL (ref 78.0–100.0)
Monocytes Absolute: 0.8 10*3/uL (ref 0.1–1.0)
Monocytes Relative: 7 %
Neutro Abs: 7.3 10*3/uL (ref 1.7–7.7)
Neutrophils Relative %: 63 %
PLATELETS: 292 10*3/uL (ref 150–400)
RBC: 4.56 MIL/uL (ref 3.87–5.11)
RDW: 11.8 % (ref 11.5–15.5)
WBC: 11.6 10*3/uL — AB (ref 4.0–10.5)

## 2016-07-19 LAB — PROTIME-INR
INR: 0.84
Prothrombin Time: 11.5 seconds (ref 11.4–15.2)

## 2016-07-19 MED ORDER — FENTANYL CITRATE (PF) 100 MCG/2ML IJ SOLN: 25.0000 ug | Freq: Once | INTRAMUSCULAR | Status: DC

## 2016-07-19 MED ORDER — LORAZEPAM 2 MG/ML IJ SOLN
1.0000 mg | Freq: Once | INTRAMUSCULAR | Status: AC
  Administered 2016-07-19: 1 mg via INTRAVENOUS
  Filled 2016-07-19: qty 1

## 2016-07-19 MED ORDER — IOPAMIDOL (ISOVUE-370) INJECTION 76%
INTRAVENOUS | Status: AC
  Administered 2016-07-19: 50 mL
  Filled 2016-07-19: qty 100

## 2016-07-19 MED ORDER — GADOBENATE DIMEGLUMINE 529 MG/ML IV SOLN
18.0000 mL | Freq: Once | INTRAVENOUS | Status: AC | PRN
  Administered 2016-07-19: 18 mL via INTRAVENOUS

## 2016-07-19 NOTE — ED Notes (Signed)
Pt returned from MRI °

## 2016-07-19 NOTE — ED Notes (Signed)
Patient states she took her Metformin this am at 0630, concerned if she could have MRI. EDP aware.

## 2016-07-19 NOTE — Discharge Instructions (Signed)
As discussed, your evaluation today has been largely reassuring.  But, it is important that you monitor your condition carefully, and do not hesitate to return to the ED if you develop new, or concerning changes in your condition. ? ?Otherwise, please follow-up with your physician for appropriate ongoing care. ? ?

## 2016-07-19 NOTE — ED Provider Notes (Signed)
[facility] DEPT Provider Note   CSN: [id number] Arrival date & time: 07/19/16  1535     History   Chief Complaint Chief Complaint  Patient presents with  . Headache  . neck swelling    HPI [name] is a 70 y.o. female.  HPI  Patient presents with concern of neck swelling, headache. She has multiple medical issues including history of aneurysm, as well as carotid artery stenosis. She notes over the past 3 days she has had pain persistently in her head, as well as swelling on the right side of her neck. Initially there was also some difficulty with swallowing, though this has improved. No new speech difficulty, syncope, confusion, disorientation. Patient typically has evaluation with her neurologist given her history of aneurysm. Today, she was unable to obtain an appointment and she was sent here for evaluation. Pain is mild, moderate, sore, improved with ibuprofen.   Past Medical History:  Diagnosis Date  . Abnormal findings on esophagogastroduodenoscopy (EGD) 07/2010  . Aneurysm [facility]) 2013   Right Brain   . Anxiety   . Colon polyps   . Diabetes mellitus 1998  . Diverticulosis   . GERD (gastroesophageal reflux disease)   . Hemorrhoids   . Hiatal hernia   . Hyperlipidemia   . Hypertension 2000  . Migraines   . Peripheral vascular disease ([place])   . Phlebitis    30 years ago  left leg  . Status post colonoscopy 07/2010  . Stroke [facility])    multiple mini strokes ( brain aneurysm )    Patient Active Problem List   Diagnosis Date Noted  . Hyperlipidemia 11/07/2015  . Insulin-requiring or dependent type II diabetes mellitus ([place]) 11/07/2015  . Encounter for Medicare annual wellness exam 06/24/2015  . Generalized anxiety disorder 03/26/2015  . Depression, controlled 03/26/2015  . CVA (cerebral infarction) 10/04/2014  . Tobacco abuse 10/04/2014  . Vitamin D deficiency 11/02/2013  . Medication management 11/02/2013  . Peripheral vascular disease due to  secondary diabetes mellitus ([place]) 05/08/2012  . Atherosclerosis of native arteries of extremity with intermittent claudication 12/24/2011  . Diverticulosis of large intestine 06/09/2010  . History of colonic polyps 06/09/2010  . ESOPHAGEAL STRICTURE 08/27/2008  . Esophageal reflux 08/27/2008  . Nontoxic multinodular goiter 03/11/2008  . Glaucoma 03/11/2008  . CEREBRAL ANEURYSM 03/11/2008  . CKD stage 3 due to type 2 diabetes mellitus ([place]) 05/01/2007  . Essential hypertension 05/01/2007    Past Surgical History:  Procedure Laterality Date  . ABDOMINAL AORTAGRAM N/A 01/04/2012   Procedure: ABDOMINAL Maxcine Ham;  Surgeon: Serafina Mitchell, MD;  Location: [facility] CATH LAB;  Service: Cardiovascular;  Laterality: N/A;  . ABDOMINAL AORTAGRAM N/A 08/15/2012   Procedure: ABDOMINAL Maxcine Ham;  Surgeon: Serafina Mitchell, MD;  Location: [facility] CATH LAB;  Service: Cardiovascular;  Laterality: N/A;  . ABDOMINAL HYSTERECTOMY  1984  . cataract surgery Right 10-22-2015  . cataract surgery Left 11-05-2015  . CESAREAN SECTION    . CHOLECYSTECTOMY    . COLONOSCOPY  07/2010  . DENTAL SURGERY  Aug. 16, 2013   left lower   . ESOPHAGOGASTRODUODENOSCOPY  07/2010  . EYE SURGERY  Nov. 2014   Laser-Glaucoma  . FEMORAL ARTERY STENT  05/11/11   Left superficial femoral and popliteal artery  . FEMORAL-POPLITEAL BYPASS GRAFT  Nov. 26, 2013   Left Angiogram-   . HERNIA REPAIR     times two  . KNEE SURGERY    . LOWER EXTREMITY ANGIOGRAM Left 08/15/2012   Procedure:  LOWER EXTREMITY ANGIOGRAM;  Surgeon: Serafina Mitchell, MD;  Location: [facility] CATH LAB;  Service: Cardiovascular;  Laterality: Left;  lt leg angio  . rotator cuff surgery    . THYROID SURGERY      OB History    No data available       Home Medications    Prior to Admission medications   Medication Sig Start Date End Date Taking? Authorizing Provider  amLODipine (NORVASC) 10 MG tablet Take 1 tablet (10 mg total) by mouth daily. 02/02/16   [name],  PA-C  aspirin 325 MG tablet Take 325 mg by mouth daily.     Historical Provider, MD  Blood Glucose Monitoring Suppl (ACCU-CHEK AVIVA PLUS) W/DEVICE KIT Check blood sugar up to three times daily 03/26/15   [name], PA-C  Cholecalciferol (VITAMIN D3) 5000 UNITS TABS Take 1 capsule by mouth daily.    Historical Provider, MD  CINNAMON PO Take 1,000 mg by mouth 2 (two) times daily.     Historical Provider, MD  citalopram (CELEXA) 20 MG tablet TAKE 1 TABLET (20 MG TOTAL) BY MOUTH DAILY. 04/29/16   [name], MD  clonazePAM (KLONOPIN) 0.5 MG tablet Take 1 tablet (0.5 mg total) by mouth 2 (two) times daily. 02/02/16   [name], PA-C  glucose blood (ACCU-CHEK AVIVA PLUS) test strip Use daily to check BS TID Dx. E11.22 08/05/15   Starlyn Skeans, PA-C  insulin aspart protamine - aspart (NOVOLOG MIX 70/30 FLEXPEN) (70-30) 100 UNIT/ML FlexPen Inject 0.15 mLs (15 Units total) into the skin 2 (two) times daily with a meal. 10/29/15   [name], MD  insulin NPH-regular Human (NOVOLIN 70/30) (70-30) 100 UNIT/ML injection Inject 25 units in the AM [place] with food, and inject 25 units [place] with evening meal. 02/26/16   [name], PA-C  Insulin Pen Needle (NOVOFINE) 32G X 6 MM MISC USE AS DIRECTED 2 TIMES A DAY 03/26/15   [name], PA-C  Insulin Syringe-Needle U-100 31G X 15/64" 0.5 ML MISC Use two pens daily with insulin 02/23/16   [name], PA-C  IRON PO Take by mouth daily.    Historical Provider, MD  JANUVIA 100 MG tablet TAKE 1/2 - 1 TABLET BY MOUTH EVERY DAY FOR DIABETES, AS DIRECTED 02/02/16   [name], PA-C  losartan (COZAAR) 100 MG tablet Take 1 tablet (100 mg total) by mouth daily. 02/02/16 02/01/17  [name], PA-C  Magnesium 500 MG CAPS Take 1 capsule by mouth daily.    Historical Provider, MD  metFORMIN (GLUCOPHAGE-XR) 500 MG 24 hr tablet Take 2 tablets (1,000 mg total) by mouth 2 (two) times daily. 02/02/16   [name], PA-C  Omega-3 Fatty Acids (FISH OIL) 1200 MG  CAPS Take 1 capsule by mouth daily.    Historical Provider, MD  simvastatin (ZOCOR) 20 MG tablet TAKE 1 TABLET (20 MG TOTAL) BY MOUTH AT BEDTIME. 05/13/16   [name], PA-C  vitamin E (VITAMIN E) 400 UNIT capsule Take 400 Units by mouth daily.    Historical Provider, MD  zinc gluconate 50 MG tablet Take 50 mg by mouth daily.    Historical Provider, MD    Family History Family History  Problem Relation Age of Onset  . CAD Mother 11    Died of MI  . Hypertension Mother   . Heart attack Mother   . Heart disease Mother   . CAD Father 57    Died of MI  . Heart disease Father   .  Heart attack Father   . CAD Brother 37    Two brothers died of MI  . Heart attack Brother   . Heart disease Brother     Amputation  . Diabetes Sister   . Hypertension Sister   . Heart attack Brother   . Heart disease Brother     Social History Social History  Substance Use Topics  . Smoking status: Current Every Day Smoker    Packs/day: 0.50    Years: 40.00    Types: Cigarettes  . Smokeless tobacco: Never Used  . Alcohol use No     Allergies   Plavix [clopidogrel bisulfate]; Amoxicillin; Ace inhibitors; Lyrica [pregabalin]; and Penicillins   Review of Systems Review of Systems  Constitutional:       Per HPI, otherwise negative  HENT:       Per HPI, otherwise negative  Eyes: Negative for visual disturbance.  Respiratory:       Per HPI, otherwise negative  Cardiovascular:       Per HPI, otherwise negative  Gastrointestinal: Positive for nausea and vomiting.  Endocrine:       Negative aside from HPI  Genitourinary:       Neg aside from HPI   Musculoskeletal:       Per HPI, otherwise negative  Skin: Negative.   Allergic/Immunologic: Negative for immunocompromised state.  Neurological: Negative for syncope.  Hematological:       History of present illness     Physical Exam Updated Vital Signs BP 152/66 (BP Location: Left Arm)   Pulse 78   Temp 98.7 F (37.1 C) (Oral)    Resp 16   Ht 5' 6"  (1.676 m)   Wt 180 lb (81.6 kg)   SpO2 94%   BMI 29.05 kg/m   Physical Exam  Constitutional: She is oriented to person, place, and time. She appears well-developed and well-nourished. No distress.  HENT:  Head: Normocephalic and atraumatic.  Mouth/Throat: Oropharynx is clear and moist.  Eyes: Conjunctivae and EOM are normal.  Neck:  Patient describes swelling in the right lateral neck, this is not appreciated on physical exam, with no tenderness to palpation, no adenopathy, no palpable mass.  Cardiovascular: Normal rate and regular rhythm.   Pulmonary/Chest: Effort normal and breath sounds normal. No stridor. No respiratory distress.  Abdominal: She exhibits no distension.  Musculoskeletal: She exhibits no edema.  Neurological: She is alert and oriented to person, place, and time. No cranial nerve deficit.  Neurologic exam unremarkable aside from mild slowed left cerebellar testing  Skin: Skin is warm and dry.  Psychiatric: She has a normal mood and affect.  Nursing note and vitals reviewed.    ED Treatments / Results  Labs (all labs ordered are listed, but only abnormal results are displayed) Labs Reviewed  CBC WITH DIFFERENTIAL/PLATELET  COMPREHENSIVE METABOLIC PANEL  I-STAT CHEM 8, ED    EKG  EKG Interpretation  Date/Time:  Monday July 19 2016 16:45:12 EDT Ventricular Rate:  71 PR Interval:    QRS Duration: 109 QT Interval:  431 QTC Calculation: 469 R Axis:   54 Text Interpretation:  Sinus rhythm Probable left atrial enlargement Baseline wander in lead(s) II III aVL aVF V2 V3 No significant change since last tracing Borderline ECG Confirmed by Carmin Muskrat  MD [phone]) on 07/19/2016 4:53:51 PM       Radiology Ct Angio Head W/cm &/or Wo Cm  Result Date: 07/19/2016 CLINICAL DATA:  New headache. History of aneurysm and stroke. Carotid artery  stenosis. EXAM: CT ANGIOGRAPHY HEAD AND NECK TECHNIQUE: Multidetector CT imaging of the head and  neck was performed using the standard protocol during bolus administration of intravenous contrast. Multiplanar CT image reconstructions and MIPs were obtained to evaluate the vascular anatomy. Carotid stenosis measurements (when applicable) are obtained utilizing NASCET criteria, using the distal internal carotid diameter as the denominator. CONTRAST:  50 mL Isovue 370 COMPARISON:  CTA neck 04/04/2015. CTA head and neck 10/05/2014. Brain MRI 10/05/2014. FINDINGS: CT HEAD FINDINGS Brain: There is no evidence of acute cortical infarct, intracranial hemorrhage, mass, midline shift, or extra-axial fluid collection. The ventricles and sulci are normal for age. Chronic right cerebellar infarcts are again seen. Cerebral white matter hypodensities are nonspecific but compatible with mild chronic small vessel ischemic disease. Vascular: Calcified atherosclerosis at the skullbase. No hyperdense vessel. Skull: No fracture or focal osseous lesion. Sinuses: Visualized paranasal sinuses and mastoid air cells are clear. Orbits: Prior bilateral cataract extraction. Review of the MIP images confirms the above findings CTA NECK FINDINGS Aortic arch: 3 vessel aortic arch with moderate atherosclerotic plaque. Right carotid system: Patent with moderate atherosclerotic plaque about the carotid bifurcation resulting in 30% proximal ICA stenosis, unchanged. Left carotid system: Relatively diffuse common carotid artery atherosclerosis with unchanged stenoses of approximately 50% at its origin and approximately 50% over a long segment in the mid to distal vessel. Calcified plaque at the carotid bifurcation results in up to 30% stenosis of the proximal ICA, unchanged. Approximately 50% stenosis of the proximal external carotid artery is also unchanged. Vertebral arteries: Patent vertebral arteries with the left being mildly dominant. Mild narrowing of the left V2 segment at the C4-5 level due to calcified plaque, unchanged. Skeleton: Mild  multilevel cervical disc degeneration. Other neck: No mass. Upper chest: Mild centrilobular emphysema in the lung apices. Review of the MIP images confirms the above findings CTA HEAD FINDINGS Anterior circulation: Internal carotid arteries are patent from skullbase to carotid termini. Moderate carotid siphon atherosclerosis is unchanged, with at most mild right cavernous segment stenosis. 3 x 1 mm outpouching projecting anteroinferiorly from the anterior right cavernous ICA is unchanged (series 504, image 111). ACAs and MCAs are patent with mild branch vessel irregularity but no evidence of major branch occlusion or flow limiting proximal stenosis. Mild left M1 narrowing is unchanged. Posterior circulation: Intracranial vertebral arteries are patent to the basilar with unchanged, mild distal right V4 narrowing. PICA and SCA origins are patent. Basilar artery is widely patent. Posterior communicating arteries are not identified. PCAs are patent without significant P1 stenosis. There is moderate P2 and more distal PCA irregularity bilaterally with unchanged, moderate to severe proximal left P2 stenosis. No aneurysm. Venous sinuses: Patent. Anatomic variants: None. Delayed phase: No abnormal enhancement. Review of the MIP images confirms the above findings IMPRESSION: 1. No evidence of acute intracranial abnormality. 2. Chronic right cerebellar infarcts. 3. No large vessel occlusion. 4. 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 5. Unchanged intracranial and extracranial atherosclerosis including moderate to severe left P2 PCA stenosis, 50% left common carotid artery stenosis, and 30% bilateral proximal ICA stenoses. Electronically Signed   By: Logan Bores M.D.   On: 07/19/2016 18:37   Ct Angio Neck W And/or Wo Contrast  Result Date: 07/19/2016 CLINICAL DATA:  New headache. History of aneurysm and stroke. Carotid artery stenosis. EXAM: CT ANGIOGRAPHY HEAD AND NECK TECHNIQUE: Multidetector CT imaging of the  head and neck was performed using the standard protocol during bolus administration of intravenous contrast. Multiplanar CT  image reconstructions and MIPs were obtained to evaluate the vascular anatomy. Carotid stenosis measurements (when applicable) are obtained utilizing NASCET criteria, using the distal internal carotid diameter as the denominator. CONTRAST:  50 mL Isovue 370 COMPARISON:  CTA neck 04/04/2015. CTA head and neck 10/05/2014. Brain MRI 10/05/2014. FINDINGS: CT HEAD FINDINGS Brain: There is no evidence of acute cortical infarct, intracranial hemorrhage, mass, midline shift, or extra-axial fluid collection. The ventricles and sulci are normal for age. Chronic right cerebellar infarcts are again seen. Cerebral white matter hypodensities are nonspecific but compatible with mild chronic small vessel ischemic disease. Vascular: Calcified atherosclerosis at the skullbase. No hyperdense vessel. Skull: No fracture or focal osseous lesion. Sinuses: Visualized paranasal sinuses and mastoid air cells are clear. Orbits: Prior bilateral cataract extraction. Review of the MIP images confirms the above findings CTA NECK FINDINGS Aortic arch: 3 vessel aortic arch with moderate atherosclerotic plaque. Right carotid system: Patent with moderate atherosclerotic plaque about the carotid bifurcation resulting in 30% proximal ICA stenosis, unchanged. Left carotid system: Relatively diffuse common carotid artery atherosclerosis with unchanged stenoses of approximately 50% at its origin and approximately 50% over a long segment in the mid to distal vessel. Calcified plaque at the carotid bifurcation results in up to 30% stenosis of the proximal ICA, unchanged. Approximately 50% stenosis of the proximal external carotid artery is also unchanged. Vertebral arteries: Patent vertebral arteries with the left being mildly dominant. Mild narrowing of the left V2 segment at the C4-5 level due to calcified plaque, unchanged.  Skeleton: Mild multilevel cervical disc degeneration. Other neck: No mass. Upper chest: Mild centrilobular emphysema in the lung apices. Review of the MIP images confirms the above findings CTA HEAD FINDINGS Anterior circulation: Internal carotid arteries are patent from skullbase to carotid termini. Moderate carotid siphon atherosclerosis is unchanged, with at most mild right cavernous segment stenosis. 3 x 1 mm outpouching projecting anteroinferiorly from the anterior right cavernous ICA is unchanged (series 504, image 111). ACAs and MCAs are patent with mild branch vessel irregularity but no evidence of major branch occlusion or flow limiting proximal stenosis. Mild left M1 narrowing is unchanged. Posterior circulation: Intracranial vertebral arteries are patent to the basilar with unchanged, mild distal right V4 narrowing. PICA and SCA origins are patent. Basilar artery is widely patent. Posterior communicating arteries are not identified. PCAs are patent without significant P1 stenosis. There is moderate P2 and more distal PCA irregularity bilaterally with unchanged, moderate to severe proximal left P2 stenosis. No aneurysm. Venous sinuses: Patent. Anatomic variants: None. Delayed phase: No abnormal enhancement. Review of the MIP images confirms the above findings IMPRESSION: 1. No evidence of acute intracranial abnormality. 2. Chronic right cerebellar infarcts. 3. No large vessel occlusion. 4. 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 5. Unchanged intracranial and extracranial atherosclerosis including moderate to severe left P2 PCA stenosis, 50% left common carotid artery stenosis, and 30% bilateral proximal ICA stenoses. Electronically Signed   By: Logan Bores M.D.   On: 07/19/2016 18:37   Mr Jeri Cos And Wo Contrast  Result Date: 07/19/2016 CLINICAL DATA:  History of aneurysm and CVA. New headache onset 3 days ago. EXAM: MRI HEAD WITHOUT AND WITH CONTRAST TECHNIQUE: Multiplanar, multiecho pulse  sequences of the brain and surrounding structures were obtained without and with intravenous contrast. CONTRAST:  55m MULTIHANCE GADOBENATE DIMEGLUMINE 529 MG/ML IV SOLN COMPARISON:  Head CT 07/19/2016, brain MRI 02/08/2016, 12/25/2012 FINDINGS: Brain: No acute infarct or intraparenchymal hemorrhage. The midline structures are normal. There are old  right cerebellar infarcts. There is multifocal hyperintense T2-weighted signal within the deep white matter, most often seen in the setting of chronic microvascular ischemia. A all No hydrocephalus or extra-axial fluid collection. Vascular: Major intracranial arterial and venous sinus flow voids are preserved. No evidence of chronic microhemorrhage or amyloid angiopathy. Skull and upper cervical spine: The visualized skull base, calvarium, upper cervical spine and extracranial soft tissues are normal. Sinuses/Orbits: No fluid levels or advanced mucosal thickening. No mastoid effusion. Normal orbits. IMPRESSION: 1. No acute intracranial abnormality. 2. Findings of chronic microvascular ischemia and old right cerebellar infarcts. Electronically Signed   By: Ulyses Jarred M.D.   On: 07/19/2016 20:05    Procedures Procedures (including critical care time)  Medications Ordered in ED Medications  iopamidol (ISOVUE-370) 76 % injection (50 mLs  Contrast Given 07/19/16 1735)  LORazepam (ATIVAN) injection 1 mg (1 mg Intravenous Given 07/19/16 1922)  gadobenate dimeglumine (MULTIHANCE) injection 18 mL (18 mLs Intravenous Contrast Given 07/19/16 1950)    After the initial evaluation review the patient's chart, including CT angiography from the one year ago and MRI from one year ago. Results below.  CTA NECK IMPRESSION: 1. No acute intracranial infarct or other abnormality identified. 2. Remote cerebellar infarcts. One of these infarcts is new relative to most recent MRI from 2010, but is chronic in appearance. 3. Atrophy with mild chronic small vessel ischemic  disease.     Electronically Signed   By: Jeannine Boga M.D.   On: 10/05/2014 08:03  MRI BRAIN IMPRESSION: Diffuse atherosclerotic disease.  Apical emphysema.   40% diameter stenosis proximal right internal carotid artery unchanged   50% diameter stenosis due to long segment calcified plaque left common carotid artery. 40% diameter stenosis proximal left internal carotid artery and 50% diameter stenosis proximal left external carotid artery   Diffuse atherosclerotic disease in both vertebral arteries which remain patent.   No change from the prior CTA.     Electronically Signed   By: Franchot Gallo M.D.   On: 04/04/2015 13:00    Initial Impression / Assessment and Plan / ED Course  I have reviewed the triage vital signs and the nursing notes.  Pertinent labs & imaging results that were available during my care of the patient were reviewed by me and considered in my medical decision making (see chart for details).  Clinical Course    Patient in no distress.   Final Clinical Impressions(s) / ED Diagnoses   Final diagnoses:  Bad headache   Patient presents with headache. Patient has multiple medical issues including aneurysm, history of migraines. Given the patient's description of new throat discomfort, as well as concern her history, andimaging studies, she was sent here from her neurologist's recommendation for evaluation. Here she is awake and alert, neurologically intact. However, given the patient's complaints, CT, MRI performed. Evaluation here reassuring, and the patient had no development of new neurologic deficits in the emergency department. Patient discharged in stable condition to follow-up with neurology.   Carmin Muskrat, MD 07/19/16 2043

## 2016-07-19 NOTE — ED Notes (Signed)
Pt returned from CT °

## 2016-07-19 NOTE — ED Provider Notes (Signed)
[facility] DEPT Provider Note   CSN: [id number] Arrival date & time: 07/19/16  1535     History   Chief Complaint Chief Complaint  Patient presents with  . Headache  . neck swelling    HPI [name] is a 70 y.o. female.  HPI   [name] is a 70 y.o. female, with a history of Aneurysm, CVA, DM, 50% carotid artery stenosis bilaterally, and hypertension, presenting to the ED with a new headache onset three days ago. Headache is on the right side, moderate in intensity, feels like a constant pressure, increasing over time, now radiating into her right neck. Different from her normal migraine headaches. Vomiting x1 upon waking this morning. Patient also complains of being unsteady on her feet, occasional palpitations, and swelling to the right side of her neck. Patient denies fever/chills, chest pain, shortness of breath, weakness, numbness, changes in vision, or any other complaints.  Last MRI was in 2015. Dr. Melton Alar is patient's neurologist. Patient called Dr. Audery Amel office today and she was told to come to the ED immediately.  Past Medical History:  Diagnosis Date  . Abnormal findings on esophagogastroduodenoscopy (EGD) 07/2010  . Aneurysm [facility]) 2013   Right Brain   . Anxiety   . Colon polyps   . Diabetes mellitus 1998  . Diverticulosis   . GERD (gastroesophageal reflux disease)   . Hemorrhoids   . Hiatal hernia   . Hyperlipidemia   . Hypertension 2000  . Migraines   . Peripheral vascular disease ([place])   . Phlebitis    30 years ago  left leg  . Status post colonoscopy 07/2010  . Stroke [facility])    multiple mini strokes ( brain aneurysm )    Patient Active Problem List   Diagnosis Date Noted  . Hyperlipidemia 11/07/2015  . Insulin-requiring or dependent type II diabetes mellitus ([place]) 11/07/2015  . Encounter for Medicare annual wellness exam 06/24/2015  . Generalized anxiety disorder 03/26/2015  . Depression, controlled 03/26/2015  . CVA (cerebral  infarction) 10/04/2014  . Tobacco abuse 10/04/2014  . Vitamin D deficiency 11/02/2013  . Medication management 11/02/2013  . Peripheral vascular disease due to secondary diabetes mellitus ([place]) 05/08/2012  . Atherosclerosis of native arteries of extremity with intermittent claudication 12/24/2011  . Diverticulosis of large intestine 06/09/2010  . History of colonic polyps 06/09/2010  . ESOPHAGEAL STRICTURE 08/27/2008  . Esophageal reflux 08/27/2008  . Nontoxic multinodular goiter 03/11/2008  . Glaucoma 03/11/2008  . CEREBRAL ANEURYSM 03/11/2008  . CKD stage 3 due to type 2 diabetes mellitus ([place]) 05/01/2007  . Essential hypertension 05/01/2007    Past Surgical History:  Procedure Laterality Date  . ABDOMINAL AORTAGRAM N/A 01/04/2012   Procedure: ABDOMINAL Maxcine Ham;  Surgeon: Serafina Mitchell, MD;  Location: [facility] CATH LAB;  Service: Cardiovascular;  Laterality: N/A;  . ABDOMINAL AORTAGRAM N/A 08/15/2012   Procedure: ABDOMINAL Maxcine Ham;  Surgeon: Serafina Mitchell, MD;  Location: [facility] CATH LAB;  Service: Cardiovascular;  Laterality: N/A;  . ABDOMINAL HYSTERECTOMY  1984  . cataract surgery Right 10-22-2015  . cataract surgery Left 11-05-2015  . CESAREAN SECTION    . CHOLECYSTECTOMY    . COLONOSCOPY  07/2010  . DENTAL SURGERY  Aug. 16, 2013   left lower   . ESOPHAGOGASTRODUODENOSCOPY  07/2010  . EYE SURGERY  Nov. 2014   Laser-Glaucoma  . FEMORAL ARTERY STENT  05/11/11   Left superficial femoral and popliteal artery  . FEMORAL-POPLITEAL BYPASS GRAFT  Nov. 26, 2013  Left Angiogram-   . HERNIA REPAIR     times two  . KNEE SURGERY    . LOWER EXTREMITY ANGIOGRAM Left 08/15/2012   Procedure: LOWER EXTREMITY ANGIOGRAM;  Surgeon: Serafina Mitchell, MD;  Location: [facility] CATH LAB;  Service: Cardiovascular;  Laterality: Left;  lt leg angio  . rotator cuff surgery    . THYROID SURGERY      OB History    No data available       Home Medications    Prior to Admission medications     Medication Sig Start Date End Date Taking? Authorizing Provider  amLODipine (NORVASC) 10 MG tablet Take 1 tablet (10 mg total) by mouth daily. 02/02/16   [name], PA-C  aspirin 325 MG tablet Take 325 mg by mouth daily.     Historical Provider, MD  Blood Glucose Monitoring Suppl (ACCU-CHEK AVIVA PLUS) W/DEVICE KIT Check blood sugar up to three times daily 03/26/15   [name], PA-C  Cholecalciferol (VITAMIN D3) 5000 UNITS TABS Take 1 capsule by mouth daily.    Historical Provider, MD  CINNAMON PO Take 1,000 mg by mouth 2 (two) times daily.     Historical Provider, MD  citalopram (CELEXA) 20 MG tablet TAKE 1 TABLET (20 MG TOTAL) BY MOUTH DAILY. 04/29/16   [name], MD  clonazePAM (KLONOPIN) 0.5 MG tablet Take 1 tablet (0.5 mg total) by mouth 2 (two) times daily. 02/02/16   [name], PA-C  glucose blood (ACCU-CHEK AVIVA PLUS) test strip Use daily to check BS TID Dx. E11.22 08/05/15   Starlyn Skeans, PA-C  insulin aspart protamine - aspart (NOVOLOG MIX 70/30 FLEXPEN) (70-30) 100 UNIT/ML FlexPen Inject 0.15 mLs (15 Units total) into the skin 2 (two) times daily with a meal. 10/29/15   [name], MD  insulin NPH-regular Human (NOVOLIN 70/30) (70-30) 100 UNIT/ML injection Inject 25 units in the AM [place] with food, and inject 25 units [place] with evening meal. 02/26/16   [name], PA-C  Insulin Pen Needle (NOVOFINE) 32G X 6 MM MISC USE AS DIRECTED 2 TIMES A DAY 03/26/15   [name], PA-C  Insulin Syringe-Needle U-100 31G X 15/64" 0.5 ML MISC Use two pens daily with insulin 02/23/16   [name], PA-C  IRON PO Take by mouth daily.    Historical Provider, MD  JANUVIA 100 MG tablet TAKE 1/2 - 1 TABLET BY MOUTH EVERY DAY FOR DIABETES, AS DIRECTED 02/02/16   [name], PA-C  losartan (COZAAR) 100 MG tablet Take 1 tablet (100 mg total) by mouth daily. 02/02/16 02/01/17  [name], PA-C  Magnesium 500 MG CAPS Take 1 capsule by mouth daily.    Historical Provider, MD  metFORMIN  (GLUCOPHAGE-XR) 500 MG 24 hr tablet Take 2 tablets (1,000 mg total) by mouth 2 (two) times daily. 02/02/16   [name], PA-C  Omega-3 Fatty Acids (FISH OIL) 1200 MG CAPS Take 1 capsule by mouth daily.    Historical Provider, MD  simvastatin (ZOCOR) 20 MG tablet TAKE 1 TABLET (20 MG TOTAL) BY MOUTH AT BEDTIME. 05/13/16   [name], PA-C  vitamin E (VITAMIN E) 400 UNIT capsule Take 400 Units by mouth daily.    Historical Provider, MD  zinc gluconate 50 MG tablet Take 50 mg by mouth daily.    Historical Provider, MD    Family History Family History  Problem Relation Age of Onset  . CAD Mother 31    Died of MI  . Hypertension Mother   .  Heart attack Mother   . Heart disease Mother   . CAD Father 51    Died of MI  . Heart disease Father   . Heart attack Father   . CAD Brother 54    Two brothers died of MI  . Heart attack Brother   . Heart disease Brother     Amputation  . Diabetes Sister   . Hypertension Sister   . Heart attack Brother   . Heart disease Brother     Social History Social History  Substance Use Topics  . Smoking status: Current Every Day Smoker    Packs/day: 0.50    Years: 40.00    Types: Cigarettes  . Smokeless tobacco: Never Used  . Alcohol use No     Allergies   Plavix [clopidogrel bisulfate]; Amoxicillin; Ace inhibitors; Lyrica [pregabalin]; and Penicillins   Review of Systems Review of Systems  Constitutional: Negative for chills, diaphoresis and fever.  Respiratory: Negative for shortness of breath.   Cardiovascular: Positive for palpitations. Negative for chest pain.  Gastrointestinal: Positive for nausea and vomiting. Negative for abdominal pain.  Musculoskeletal: Positive for neck pain. Negative for back pain.  Neurological: Positive for headaches. Negative for syncope and weakness.       Unsteadiness  All other systems reviewed and are negative.    Physical Exam Updated Vital Signs BP 152/66 (BP Location: Left Arm)   Pulse 78    Temp 98.7 F (37.1 C) (Oral)   Resp 16   Ht 5' 6"  (1.676 m)   Wt 81.6 kg   SpO2 94%   BMI 29.05 kg/m   Physical Exam  Constitutional: She is oriented to person, place, and time. She appears well-developed and well-nourished. No distress.  HENT:  Head: Normocephalic and atraumatic.  Mouth/Throat: Oropharynx is clear and moist.  Eyes: Conjunctivae and EOM are normal. Pupils are equal, round, and reactive to light.  Neck: Normal range of motion. Neck supple.  Tenderness to the right neck over the carotid.  Cardiovascular: Normal rate, regular rhythm, normal heart sounds and intact distal pulses.   Pulmonary/Chest: Effort normal and breath sounds normal. No stridor. No respiratory distress.  Abdominal: Soft. There is no tenderness. There is no guarding.  Musculoskeletal: She exhibits no edema.  Full ROM in all extremities and spine. No midline spinal tenderness.   Lymphadenopathy:    She has no cervical adenopathy.  Neurological: She is alert and oriented to person, place, and time. She has normal reflexes.  Patient walks with an unsteady gait, which she states is new for her. Positive romberg. No sensory deficits. Strength 5/5 in all extremities. All other cranial nerves III-XII grossly intact. No facial droop.    Skin: Skin is warm and dry. She is not diaphoretic.  Psychiatric: She has a normal mood and affect. Her behavior is normal.  Nursing note and vitals reviewed.    ED Treatments / Results  Labs (all labs ordered are listed, but only abnormal results are displayed) Labs Reviewed  CBC WITH DIFFERENTIAL/PLATELET - Abnormal; Notable for the following:       Result Value   WBC 11.6 (*)    All other components within normal limits  COMPREHENSIVE METABOLIC PANEL - Abnormal; Notable for the following:    Glucose, Bld 275 (*)    Calcium 8.8 (*)    Total Protein 6.1 (*)    Albumin 3.1 (*)    Total Bilirubin 1.4 (*)    GFR calc non Af Amer 56 (*)  All other components  within normal limits  I-STAT CHEM 8, ED - Abnormal; Notable for the following:    Chloride 100 (*)    BUN 27 (*)    Creatinine, Ser 1.10 (*)    Glucose, Bld 262 (*)    Calcium, Ion 1.08 (*)    All other components within normal limits  PROTIME-INR    EKG  EKG Interpretation  Date/Time:  Monday July 19 2016 16:45:12 EDT Ventricular Rate:  71 PR Interval:    QRS Duration: 109 QT Interval:  431 QTC Calculation: 469 R Axis:   54 Text Interpretation:  Sinus rhythm Probable left atrial enlargement Baseline wander in lead(s) II III aVL aVF V2 V3 No significant change since last tracing Borderline ECG Confirmed by Carmin Muskrat  MD [id number]) on 07/19/2016 4:53:51 PM       Radiology No results found.  Procedures Procedures (including critical care time)  Medications Ordered in ED Medications  iopamidol (ISOVUE-370) 76 % injection (not administered)     Initial Impression / Assessment and Plan / ED Course  I have reviewed the triage vital signs and the nursing notes.  Pertinent labs & imaging results that were available during my care of the patient were reviewed by me and considered in my medical decision making (see chart for details).  Clinical Course    This was a medical screening exam performed in triage. Patient's story and physical exam warrant MRI and MRA of the brain as well as CTA of the neck. The situation was discussed with Dr. Vanita Panda, who will assume care of this patient.   Vitals:   07/19/16 1552 07/19/16 1637 07/19/16 1638 07/19/16 1700  BP: 152/66 182/64  131/61  Pulse: 78  74 65  Resp: 16  16 18   Temp: 98.7 F (37.1 C)     TempSrc: Oral     SpO2: 94%  95% 94%  Weight: 81.6 kg     Height: 5' 6"  (1.676 m)         Final Clinical Impressions(s) / ED Diagnoses   Final diagnoses:  None    New Prescriptions New Prescriptions   No medications on file     Lorayne Bender, PA-C 07/19/16 1726    Carmin Muskrat, MD 07/20/16 0010

## 2016-07-19 NOTE — ED Notes (Signed)
Pt transported to MRI 

## 2016-07-19 NOTE — ED Triage Notes (Addendum)
Pt here for for R sided headache x 3 days.   Emesis x 1 today.  R neck swelling per pt.  Airway patent.  R sided brain Aneurism.  Pt has migraines, but this is different than her typical migraines.

## 2016-07-22 ENCOUNTER — Ambulatory Visit (INDEPENDENT_AMBULATORY_CARE_PROVIDER_SITE_OTHER): Payer: Medicare Other | Admitting: Internal Medicine

## 2016-07-22 ENCOUNTER — Encounter: Payer: Self-pay | Admitting: Internal Medicine

## 2016-07-22 VITALS — BP 116/62 | HR 72 | Temp 97.5°F | Resp 16 | Ht 66.0 in | Wt 180.8 lb

## 2016-07-22 DIAGNOSIS — Z794 Long term (current) use of insulin: Secondary | ICD-10-CM | POA: Diagnosis not present

## 2016-07-22 DIAGNOSIS — Z136 Encounter for screening for cardiovascular disorders: Secondary | ICD-10-CM

## 2016-07-22 DIAGNOSIS — I10 Essential (primary) hypertension: Secondary | ICD-10-CM | POA: Diagnosis not present

## 2016-07-22 DIAGNOSIS — N183 Chronic kidney disease, stage 3 (moderate): Secondary | ICD-10-CM | POA: Diagnosis not present

## 2016-07-22 DIAGNOSIS — Z1212 Encounter for screening for malignant neoplasm of rectum: Secondary | ICD-10-CM

## 2016-07-22 DIAGNOSIS — D513 Other dietary vitamin B12 deficiency anemia: Secondary | ICD-10-CM | POA: Diagnosis not present

## 2016-07-22 DIAGNOSIS — E119 Type 2 diabetes mellitus without complications: Secondary | ICD-10-CM | POA: Diagnosis not present

## 2016-07-22 DIAGNOSIS — E782 Mixed hyperlipidemia: Secondary | ICD-10-CM | POA: Diagnosis not present

## 2016-07-22 DIAGNOSIS — E559 Vitamin D deficiency, unspecified: Secondary | ICD-10-CM

## 2016-07-22 DIAGNOSIS — D509 Iron deficiency anemia, unspecified: Secondary | ICD-10-CM | POA: Diagnosis not present

## 2016-07-22 DIAGNOSIS — E1122 Type 2 diabetes mellitus with diabetic chronic kidney disease: Secondary | ICD-10-CM | POA: Diagnosis not present

## 2016-07-22 DIAGNOSIS — Z1211 Encounter for screening for malignant neoplasm of colon: Secondary | ICD-10-CM

## 2016-07-22 DIAGNOSIS — Z23 Encounter for immunization: Secondary | ICD-10-CM | POA: Diagnosis not present

## 2016-07-22 DIAGNOSIS — Z79899 Other long term (current) drug therapy: Secondary | ICD-10-CM

## 2016-07-22 DIAGNOSIS — F419 Anxiety disorder, unspecified: Secondary | ICD-10-CM

## 2016-07-22 DIAGNOSIS — K219 Gastro-esophageal reflux disease without esophagitis: Secondary | ICD-10-CM

## 2016-07-22 LAB — CBC WITH DIFFERENTIAL/PLATELET
BASOS ABS: 0 {cells}/uL (ref 0–200)
Basophils Relative: 0 %
EOS ABS: 98 {cells}/uL (ref 15–500)
Eosinophils Relative: 1 %
HEMATOCRIT: 43.1 % (ref 35.0–45.0)
HEMOGLOBIN: 13.9 g/dL (ref 11.7–15.5)
LYMPHS ABS: 2646 {cells}/uL (ref 850–3900)
Lymphocytes Relative: 27 %
MCH: 31.7 pg (ref 27.0–33.0)
MCHC: 32.3 g/dL (ref 32.0–36.0)
MCV: 98.2 fL (ref 80.0–100.0)
MONO ABS: 686 {cells}/uL (ref 200–950)
MPV: 10.9 fL (ref 7.5–12.5)
Monocytes Relative: 7 %
NEUTROS PCT: 65 %
Neutro Abs: 6370 cells/uL (ref 1500–7800)
Platelets: 336 10*3/uL (ref 140–400)
RBC: 4.39 MIL/uL (ref 3.80–5.10)
RDW: 12.3 % (ref 11.0–15.0)
WBC: 9.8 10*3/uL (ref 3.8–10.8)

## 2016-07-22 LAB — TSH: TSH: 1.15 mIU/L

## 2016-07-22 MED ORDER — CITALOPRAM HYDROBROMIDE 40 MG PO TABS: ORAL_TABLET | ORAL | 1 refills | Status: DC

## 2016-07-22 NOTE — Addendum Note (Signed)
Addended by: Melbourne Abts C on: 07/22/2016 04:50 PM   Modules accepted: Orders

## 2016-07-22 NOTE — Addendum Note (Signed)
Addended by: [name] on: 07/22/2016 05:04 PM   Modules accepted: Orders

## 2016-07-22 NOTE — Progress Notes (Addendum)
[facility] [name], M.D.    Uvaldo Bristle. Silverio Lay, P.A.-C      Starlyn Skeans, P.A.-C  [facility]                [address] [place] [address] Telephone [phone] Telefax [phone]  Comprehensive Evaluation &  Examination     This very nice 70 y.o. MBF presents for a comprehensive evaluation and management of multiple medical co-morbidities.  Patient has been followed for HTN, T2_DM  , Hyperlipidemia and Vitamin D Deficiency. Patient also has GERD controlled with her Omeprazole.       HTN predates circa 2000. Patient's BP has been controlled at home and patient denies any cardiac symptoms as chest pain, palpitations, shortness of breath, dizziness or ankle swelling. Today's BP is  116/62      Patient's hyperlipidemia is controlled with diet and medications. Patient denies myalgias or other medication SE's. Last lipids were at goal: Lab Results  Component Value Date   CHOL 167 02/02/2016   HDL 50 02/02/2016   LDLCALC 99 02/02/2016   LDLDIRECT 146.4 01/30/2010   TRIG 88 02/02/2016   CHOLHDL 3.3 02/02/2016      Patient has  Insulin Requiring T2_DM predating since initial Dx in 1998 and despite multiple oral diabetic agents her compulsive gluttony overshadowed her therapy and ultimately she was put on Novolin 70/30 mix. Unfortunately she continues to eat exceeding her insulin and has gained 20# weight  in the last 6 months. Patient denies reactive hypoglycemic symptoms, visual blurring, diabetic polys, or paresthesias. She reports her CBG's range in the 200's. Last A1c was not at goal: Lab Results  Component Value Date   HGBA1C 9.5 (H) 02/02/2016      Finally, patient has history of Vitamin D Deficiency in 2012 of "31" and sporadically takes her supplements and consequently her last Vitamin D was still very low: Lab Results  Component Value Date   VD25OH 26 (L) 11/07/2015    Current Outpatient Prescriptions on File Prior to Visit  Medication Sig  . amLODipine  10 MG  Take 1 tab daily.  Marland Kitchen aspirin 325 MG tablet Take  daily.   Marland Kitchen VITAMIN D3 5000 UNITS  Take 1 cap daily.  Marland Kitchen CINNAMON PO Take 1,000 mg  2  times daily.   . Citalopram 20 MG tablet TAKE 1 TAB DAILY.  . clonazePAM 0.5 MG tablet Take 1 tab 2  times daily.  Marland Kitchen NOVOLOG MIX 70/30 15 Units  2  times daily with a meal.  . NOVOLIN 70/30  5 units - AM, and inject 25 units  with evening meal  . IRON PO Take by mouth daily.  Marland Kitchen JANUVIA 100 MG tablet TAKE 1/2 - 1 TAB EVERY DAY   . losartan ( 100 MG tablet Take 1 tab daily.  . Magnesium 500 MG CAPS Take 1 cap daily.  . metFORMIN-XR 500 MG 24 hr tablet Take 2 tab 2  times daily.  . Omega-3 FISH OIL 1200 MG CAPS Take 1 cap daily.  . simvastatin20 MG tablet TAKE 1 TAB AT BEDTIME.  . vitamin E 400 UNIT capsule Take 400 Units  daily.  Marland Kitchen zinc gluconate 50 MG tablet Take 50 mg  daily.   Allergies  Allergen Reactions  . Plavix [Clopidogrel Bisulfate] Palpitations  . Amoxicillin Itching and Swelling    FACE &  EYES SWELL  . Ace Inhibitors   . Lyrica [Pregabalin]   . Penicillins Other (See Comments)    REACTION: unspecified   Past Medical History:  Diagnosis Date  . Abnormal findings on esophagogastroduodenoscopy (EGD) 07/2010  . Aneurysm [facility]) 2013   Right Brain   . Anxiety   . Colon polyps   . Diabetes mellitus 1998  . Diverticulosis   . GERD (gastroesophageal reflux disease)   . Hemorrhoids   . Hiatal hernia   . Hyperlipidemia   . Hypertension 2000  . Migraines   . Peripheral vascular disease ([place])   . Phlebitis    30 years ago  left leg  . Status post colonoscopy 07/2010  . Stroke [facility])    multiple mini strokes ( brain aneurysm )   Health Maintenance  Topic Date Due  . COLONOSCOPY  10/23/1996  . ZOSTAVAX  10/23/2006  . PNA vac Low Risk Adult (2 of 2 - PCV13) 02/09/2015  . FOOT EXAM  03/25/2016  . INFLUENZA VACCINE  04/20/2016  .  OPHTHALMOLOGY EXAM  04/24/2016  . MAMMOGRAM  04/30/2016  . HEMOGLOBIN A1C  08/04/2016  . TETANUS/TDAP  03/25/2025  . DEXA SCAN  Completed  . Hepatitis C Screening  Completed   Immunization History  Administered Date(s) Administered  . DT 03/26/2015  . Influenza Whole 08/02/2007, 06/18/2009, 07/09/2013  . Influenza, High Dose Seasonal PF 08/05/2015, 07/22/2016  . Pneumococcal Conjugate-13 07/22/2016  . Pneumococcal Polysaccharide-23 02/08/2014  . Td 09/21/2004   Past Surgical History:  Procedure Laterality Date  . ABDOMINAL AORTAGRAM N/A 01/04/2012   Procedure: ABDOMINAL Maxcine Ham;  Surgeon: Serafina Mitchell, MD;  Location: [facility] CATH LAB;  Service: Cardiovascular;  Laterality: N/A;  . ABDOMINAL AORTAGRAM N/A 08/15/2012   Procedure: ABDOMINAL Maxcine Ham;  Surgeon: Serafina Mitchell, MD;  Location[facility] CATH LAB;  Service: Cardiovascular;  Laterality: N/A;  . ABDOMINAL HYSTERECTOMY  1984  . cataract surgery Right 10-22-2015  . cataract surgery Left 11-05-2015  . CESAREAN SECTION    . CHOLECYSTECTOMY    . COLONOSCOPY  07/2010  . DENTAL SURGERY  Aug. 16, 2013   left lower   . ESOPHAGOGASTRODUODENOSCOPY  07/2010  . EYE SURGERY  Nov. 2014   Laser-Glaucoma  . FEMORAL ARTERY STENT  05/11/11   Left superficial femoral and popliteal artery  . FEMORAL-POPLITEAL BYPASS GRAFT  Nov. 26, 2013   Left Angiogram-   . HERNIA REPAIR     times two  . KNEE SURGERY    . LOWER EXTREMITY ANGIOGRAM Left 08/15/2012   Procedure: LOWER EXTREMITY ANGIOGRAM;  Surgeon: Serafina Mitchell, MD;  Location: [facility] CATH LAB;  Service: Cardiovascular;  Laterality: Left;  lt leg angio  . rotator cuff surgery    . THYROID SURGERY     Family History  Problem Relation Age of Onset  . CAD Mother 21    Died of MI  . Hypertension Mother   . Heart attack Mother   . Heart disease Mother   . CAD Father 75    Died of MI  . Heart disease Father   . Heart attack Father   . CAD Brother 69    Two brothers died of MI  . Heart  attack Brother   . Heart disease Brother     Amputation  . Diabetes Sister   . Hypertension Sister   . Heart attack Brother   . Heart disease Brother    Social History  Substance Use Topics  . Smoking status: Current  Every Day Smoker    Packs/day: 0.50    Years: 40.00    Types: Cigarettes  . Smokeless tobacco: Never Used  . Alcohol use No    ROS Constitutional: Denies fever, chills, weight loss/gain, headaches, insomnia,  night sweats, and change in appetite. Does c/o fatigue. Eyes: Denies redness, blurred vision, diplopia, discharge, itchy, watery eyes.  ENT: Denies discharge, congestion, post nasal drip, epistaxis, sore throat, earache, hearing loss, dental pain, Tinnitus, Vertigo, Sinus pain, snoring.  Cardio: Denies chest pain, palpitations, irregular heartbeat, syncope, dyspnea, diaphoresis, orthopnea, PND, claudication, edema Respiratory: denies cough, dyspnea, DOE, pleurisy, hoarseness, laryngitis, wheezing.  Gastrointestinal: Denies dysphagia, heartburn, reflux, water brash, pain, cramps, nausea, vomiting, bloating, diarrhea, constipation, hematemesis, melena, hematochezia, jaundice, hemorrhoids Genitourinary: Denies dysuria, frequency, urgency, nocturia, hesitancy, discharge, hematuria, flank pain Breast: Breast lumps, nipple discharge, bleeding.  Musculoskeletal: Denies arthralgia, myalgia, stiffness, Jt. Swelling, pain, limp, and strain/sprain. Denies falls. Skin: Denies puritis, rash, hives, warts, acne, eczema, changing in skin lesion Neuro: No weakness, tremor, incoordination, spasms, paresthesia, pain Psychiatric: Denies confusion, memory loss, sensory loss. Denies Depression. Endocrine: Denies change in weight, skin, hair change, nocturia, and paresthesia, diabetic polys, visual blurring, hyper / hypo glycemic episodes.  Heme/Lymph: No excessive bleeding, bruising, enlarged lymph nodes.  Physical Exam  BP 116/62   Pulse 72   Temp 97.5 F (36.4 C)   Resp 16   Ht  5\' 6"  (1.676 m)   Wt 180 lb 12.8 oz (82 kg)   BMI 29.18 kg/m   General Appearance: Over nourished with Central Obesity and in no apparent distress.  Eyes: PERRLA, EOMs, conjunctiva no swelling or erythema, normal fundi and vessels. Sinuses: No frontal/maxillary tenderness ENT/Mouth: EACs patent / TMs  nl. Nares clear without erythema, swelling, mucoid exudates. Oral hygiene is good. No erythema, swelling, or exudate. Tongue normal, non-obstructing. Tonsils not swollen or erythematous. Hearing normal.  Neck: Supple, thyroid normal. No bruits, nodes or JVD. Respiratory: Respiratory effort normal.  BS equal and clear bilateral without rales, rhonci, wheezing or stridor. Cardio: Heart sounds are normal with regular rate and rhythm and no murmurs, rubs or gallops. Peripheral pulses are normal and equal bilaterally without edema. No aortic or femoral bruits. Chest: symmetric with normal excursions and percussion. Breasts: Symmetric, without lumps, nipple discharge, retractions, or fibrocystic changes.  Abdomen: Flat, soft with bowel sounds active. Nontender, no guarding, rebound, hernias, masses, or organomegaly.  Lymphatics: Non tender without lymphadenopathy.  Genitourinary:  Musculoskeletal: Full ROM all peripheral extremities, joint stability, 5/5 strength, and normal gait. Skin: Warm and dry without rashes, lesions, cyanosis, clubbing or  ecchymosis.  Neuro: Cranial nerves intact, reflexes equal bilaterally. Normal muscle tone, no cerebellar symptoms. Sensation intact to touch, vibratory and Monofilament testing to the toes bilaterally. Marland Kitchen  Pysch: Alert and oriented X 3, normal affect, Insight and Judgment appropriate.   Assessment and Plan  1. Essential hypertension  - Microalbumin / creatinine urine ratio - EKG 12-Lead - TSH  2. Mixed hyperlipidemia  - Lipid panel - TSH  3. Insulin-requiring or dependent type II diabetes mellitus ([place])  - discussed tapering Novolin 70/30 mix to  15 u qam and 10 units qpm to allow decreasing caloric intake.  - ROV 2 weeks with bid list of fasting CBG's - HM DIABETES FOOT EXAM - LOW EXTREMITY NEUR EXAM DOCUM - Hemoglobin A1c - Glutamic acid decarboxylase auto abs to r/o LADA (latent autoimmune Diabetes of Adults)  4. Vitamin D deficiency  - VITAMIN D 25 Hydroxy   5.  CKD stage 3 due to type 2 diabetes mellitus ([place])   6. Gastroesophageal reflux disease   7. Screening for colorectal cancer  - POC Hemoccult Bld/Stl   8. Screening for ischemic heart disease   9. Other dietary vitamin B12 deficiency anemia  -check levels in view of dual therapy with PPI/MF - Vitamin B12  10. Iron deficiency anemia  -check levels in view of dual therapy with PPI/MF - Iron and TIBC  11. Medication management  - Urinalysis, Routine w reflex microscopic  - Vitamin B12 - Iron and TIBC - CBC with Differential/Platelet - BASIC METABOLIC PANEL WITH GFR - Hepatic function panel - Magnesium  12. Need for prophylactic vaccination and inoculation against influenza  - Flu vaccine HIGH DOSE PF (Fluzone High dose)  13. Need for prophylactic vaccination against Streptococcus pneumoniae (pneumococcus)  - Pneumococcal conjugate vaccine 13-valent  14. Chronic anxiety  - citalopram (CELEXA) 40 MG tablet; Take 1 tablet daily for Mood & Anxiety  Dispense: 90 tablet; Refill: 1       Continue prudent diet as discussed, weight control, BP monitoring, regular exercise, and medications. Discussed med's effects and SE's. Screening labs and tests as requested with regular follow-up as recommended. Over 40 minutes of exam, counseling, chart review and high complex critical decision making was performed.

## 2016-07-22 NOTE — Patient Instructions (Addendum)

## 2016-07-23 LAB — URINALYSIS, ROUTINE W REFLEX MICROSCOPIC
Bilirubin Urine: NEGATIVE
HGB URINE DIPSTICK: NEGATIVE
KETONES UR: NEGATIVE
LEUKOCYTES UA: NEGATIVE
NITRITE: NEGATIVE
Specific Gravity, Urine: 1.031 (ref 1.001–1.035)
pH: 5.5 (ref 5.0–8.0)

## 2016-07-23 LAB — IRON AND TIBC
%SAT: 12 % (ref 11–50)
IRON: 50 ug/dL (ref 45–160)
TIBC: 420 ug/dL (ref 250–450)
UIBC: 370 ug/dL (ref 125–400)

## 2016-07-23 LAB — BASIC METABOLIC PANEL WITH GFR
BUN: 18 mg/dL (ref 7–25)
CALCIUM: 8.8 mg/dL (ref 8.6–10.4)
CO2: 25 mmol/L (ref 20–31)
CREATININE: 1.24 mg/dL — AB (ref 0.50–0.99)
Chloride: 101 mmol/L (ref 98–110)
GFR, EST NON AFRICAN AMERICAN: 44 mL/min — AB (ref >=60)
GFR, Est African American: 51 mL/min — ABNORMAL LOW (ref >=60)
GLUCOSE: 409 mg/dL — AB (ref 65–99)
Potassium: 4.4 mmol/L (ref 3.5–5.3)
Sodium: 137 mmol/L (ref 135–146)

## 2016-07-23 LAB — HEMOGLOBIN A1C
HEMOGLOBIN A1C: 9.3 % — AB (ref <5.7)
MEAN PLASMA GLUCOSE: 220 mg/dL

## 2016-07-23 LAB — LIPID PANEL
CHOLESTEROL: 164 mg/dL (ref 125–200)
HDL: 40 mg/dL — ABNORMAL LOW (ref >=46)
LDL Cholesterol: 71 mg/dL (ref <130)
Total CHOL/HDL Ratio: 4.1 Ratio (ref <=5.0)
Triglycerides: 264 mg/dL — ABNORMAL HIGH (ref <150)
VLDL: 53 mg/dL — AB (ref <30)

## 2016-07-23 LAB — MAGNESIUM: Magnesium: 1.8 mg/dL (ref 1.5–2.5)

## 2016-07-23 LAB — URINALYSIS, MICROSCOPIC ONLY
Bacteria, UA: NONE SEEN [HPF]
CASTS: NONE SEEN [LPF]
CRYSTALS: NONE SEEN [HPF]
RBC / HPF: NONE SEEN RBC/HPF (ref <=2)
Squamous Epithelial / LPF: NONE SEEN [HPF] (ref <=5)
WBC UA: NONE SEEN WBC/HPF (ref <=5)
YEAST: NONE SEEN [HPF]

## 2016-07-23 LAB — MICROALBUMIN / CREATININE URINE RATIO
CREATININE, URINE: 71 mg/dL (ref 20–320)
MICROALB UR: 75.3 mg/dL
Microalb Creat Ratio: 1061 mcg/mg creat — ABNORMAL HIGH (ref <30)

## 2016-07-23 LAB — VITAMIN D 25 HYDROXY (VIT D DEFICIENCY, FRACTURES): Vit D, 25-Hydroxy: 22 ng/mL — ABNORMAL LOW (ref 30–100)

## 2016-07-23 LAB — HEPATIC FUNCTION PANEL
ALT: 12 U/L (ref 6–29)
AST: 16 U/L (ref 10–35)
Albumin: 3.5 g/dL — ABNORMAL LOW (ref 3.6–5.1)
Alkaline Phosphatase: 100 U/L (ref 33–130)
BILIRUBIN INDIRECT: 0.3 mg/dL (ref 0.2–1.2)
Bilirubin, Direct: 0.1 mg/dL (ref <=0.2)
TOTAL PROTEIN: 6.6 g/dL (ref 6.1–8.1)
Total Bilirubin: 0.4 mg/dL (ref 0.2–1.2)

## 2016-07-23 LAB — VITAMIN B12: VITAMIN B 12: 713 pg/mL (ref 200–1100)

## 2016-07-25 LAB — GLUTAMIC ACID DECARBOXYLASE AUTO ABS: Glutamic Acid Decarb Ab: <5 IU/mL (ref <5)

## 2016-07-26 DIAGNOSIS — Z961 Presence of intraocular lens: Secondary | ICD-10-CM | POA: Diagnosis not present

## 2016-07-26 DIAGNOSIS — H04123 Dry eye syndrome of bilateral lacrimal glands: Secondary | ICD-10-CM | POA: Diagnosis not present

## 2016-07-26 DIAGNOSIS — H26491 Other secondary cataract, right eye: Secondary | ICD-10-CM | POA: Diagnosis not present

## 2016-07-26 DIAGNOSIS — H04221 Epiphora due to insufficient drainage, right lacrimal gland: Secondary | ICD-10-CM | POA: Diagnosis not present

## 2016-08-09 ENCOUNTER — Ambulatory Visit (INDEPENDENT_AMBULATORY_CARE_PROVIDER_SITE_OTHER): Payer: Medicare Other | Admitting: Internal Medicine

## 2016-08-09 ENCOUNTER — Encounter: Payer: Self-pay | Admitting: Internal Medicine

## 2016-08-09 ENCOUNTER — Ambulatory Visit: Payer: Self-pay | Admitting: Internal Medicine

## 2016-08-09 VITALS — BP 138/64 | HR 74 | Temp 98.2°F | Resp 16 | Ht 66.0 in | Wt 180.0 lb

## 2016-08-09 DIAGNOSIS — K222 Esophageal obstruction: Secondary | ICD-10-CM | POA: Diagnosis not present

## 2016-08-09 DIAGNOSIS — K219 Gastro-esophageal reflux disease without esophagitis: Secondary | ICD-10-CM

## 2016-08-09 DIAGNOSIS — K5730 Diverticulosis of large intestine without perforation or abscess without bleeding: Secondary | ICD-10-CM

## 2016-08-09 DIAGNOSIS — Z0001 Encounter for general adult medical examination with abnormal findings: Secondary | ICD-10-CM

## 2016-08-09 DIAGNOSIS — I671 Cerebral aneurysm, nonruptured: Secondary | ICD-10-CM | POA: Diagnosis not present

## 2016-08-09 DIAGNOSIS — I639 Cerebral infarction, unspecified: Secondary | ICD-10-CM | POA: Diagnosis not present

## 2016-08-09 DIAGNOSIS — Z720 Tobacco use: Secondary | ICD-10-CM

## 2016-08-09 DIAGNOSIS — E1351 Other specified diabetes mellitus with diabetic peripheral angiopathy without gangrene: Secondary | ICD-10-CM | POA: Diagnosis not present

## 2016-08-09 DIAGNOSIS — F329 Major depressive disorder, single episode, unspecified: Secondary | ICD-10-CM

## 2016-08-09 DIAGNOSIS — Z860100 Personal history of colon polyps, unspecified: Secondary | ICD-10-CM

## 2016-08-09 DIAGNOSIS — N183 Chronic kidney disease, stage 3 unspecified: Secondary | ICD-10-CM

## 2016-08-09 DIAGNOSIS — E1122 Type 2 diabetes mellitus with diabetic chronic kidney disease: Secondary | ICD-10-CM | POA: Diagnosis not present

## 2016-08-09 DIAGNOSIS — I70219 Atherosclerosis of native arteries of extremities with intermittent claudication, unspecified extremity: Secondary | ICD-10-CM

## 2016-08-09 DIAGNOSIS — E042 Nontoxic multinodular goiter: Secondary | ICD-10-CM

## 2016-08-09 DIAGNOSIS — F32A Depression, unspecified: Secondary | ICD-10-CM

## 2016-08-09 DIAGNOSIS — R6889 Other general symptoms and signs: Secondary | ICD-10-CM

## 2016-08-09 DIAGNOSIS — E119 Type 2 diabetes mellitus without complications: Secondary | ICD-10-CM

## 2016-08-09 DIAGNOSIS — Z0000 Encounter for general adult medical examination without abnormal findings: Secondary | ICD-10-CM

## 2016-08-09 DIAGNOSIS — Z79899 Other long term (current) drug therapy: Secondary | ICD-10-CM

## 2016-08-09 DIAGNOSIS — F411 Generalized anxiety disorder: Secondary | ICD-10-CM

## 2016-08-09 DIAGNOSIS — E782 Mixed hyperlipidemia: Secondary | ICD-10-CM

## 2016-08-09 DIAGNOSIS — H409 Unspecified glaucoma: Secondary | ICD-10-CM

## 2016-08-09 DIAGNOSIS — E559 Vitamin D deficiency, unspecified: Secondary | ICD-10-CM

## 2016-08-09 DIAGNOSIS — Z794 Long term (current) use of insulin: Secondary | ICD-10-CM

## 2016-08-09 DIAGNOSIS — I10 Essential (primary) hypertension: Secondary | ICD-10-CM | POA: Diagnosis not present

## 2016-08-09 MED ORDER — SIMVASTATIN 20 MG PO TABS: ORAL_TABLET | ORAL | 2 refills | Status: DC

## 2016-08-09 MED ORDER — LOSARTAN POTASSIUM 100 MG PO TABS: 100.0000 mg | ORAL_TABLET | Freq: Every day | ORAL | 2 refills | Status: DC

## 2016-08-09 MED ORDER — AMLODIPINE BESYLATE 10 MG PO TABS: 10.0000 mg | ORAL_TABLET | Freq: Every day | ORAL | 2 refills | Status: DC

## 2016-08-09 MED ORDER — FLUTICASONE PROPIONATE 50 MCG/ACT NA SUSP: 2.0000 | Freq: Every day | NASAL | 0 refills | Status: DC

## 2016-08-09 MED ORDER — AZITHROMYCIN 250 MG PO TABS: ORAL_TABLET | ORAL | 0 refills | Status: DC

## 2016-08-09 MED ORDER — METFORMIN HCL ER 500 MG PO TB24: 1000.0000 mg | ORAL_TABLET | Freq: Two times a day (BID) | ORAL | 1 refills | Status: DC

## 2016-08-09 MED ORDER — BENZONATATE 200 MG PO CAPS: 200.0000 mg | ORAL_CAPSULE | Freq: Three times a day (TID) | ORAL | 0 refills | Status: DC | PRN

## 2016-08-09 NOTE — Progress Notes (Signed)
MEDICARE ANNUAL WELLNESS VISIT AND FOLLOW UP  Assessment:    1. Type 2 diabetes mellitus with stage 3 chronic kidney disease, without long-term current use of insulin ([place]) -stop [place] due to kidney function -increase water intake -long discussion today on dietary changes -insulin dose changed to 12 units of insulin at bedtime and 15 units of insulin in the morning - metFORMIN (GLUCOPHAGE-XR) 500 MG 24 hr tablet; Take 2 tablets (1,000 mg total) by mouth 2 (two) times daily.  Dispense: 360 tablet; Refill: 1  2. Atherosclerosis of native artery of extremity with intermittent claudication, unspecified extremity ([place]) -cont statin -cont blood sugar control -cont blood pressure control  3. CEREBRAL ANEURYSM -cont monitoring with neuro -cont BP control -cont BS control  4. Essential hypertension -cont meds -dash diet -exercise as tolerated -cont monitoring at home -call office if 150/90 or more  5. Peripheral vascular disease due to secondary diabetes mellitus ([place]) -followed by cards -does have stents in the legs  6. Diverticulosis of large intestine without hemorrhage -cont following with GI for colonoscopy -not an active problem  7. Gastroesophageal reflux disease, esophagitis presence not specified -cont meds prn -followed by GI  8. ESOPHAGEAL STRICTURE -followed by GI  9. CKD stage 3 due to type 2 diabetes mellitus ([facility]) -cont insulin -cont metformin -start exercise  -work on diet  10. Insulin-requiring or dependent type II diabetes mellitus ([place]) -cont insulin -cont metformin -start exercise  -work on diet  11. Nontoxic multinodular goiter -cont tsh -cont levothryoxine  12. Cerebral infarction, unspecified mechanism ([place]) -cont following with neuro -cont statin -cont BP control -asa  13. Depression, controlled -cont meds -well controlled  14. Generalized anxiety disorder -cont meds -well controlled  15. Glaucoma, unspecified glaucoma type,  unspecified laterality -follow with opthalmology  16. History of colonic polyps -cont following with GI  17. Mixed hyperlipidemia -cont statin therapy -goal LDL <  70  18. Medication management -due next visit  32. Tobacco abuse -quit smoking  20. Vitamin D deficiency -Cont Vit D  21. Encounter for Medicare annual wellness exam -due next year    Over 30 minutes of exam, counseling, chart review, and critical decision making was performed  Future Appointments Date Time Provider [place]  10/27/2016 4:30 PM Starlyn Skeans, PA-C [facility] None  12/20/2016 10:00 AM [facility] 4 [facility]  12/20/2016 11:15 AM Sharmon Leyden Nickel, NP [facility]  01/26/2017 4:30 PM [name], MD [facility] None  09/05/2017 3:00 PM [name], MD [facility] None    Plan:   During the course of the visit the patient was educated and counseled about appropriate screening and preventive services including:    Pneumococcal vaccine   Influenza vaccine  Td vaccine  Prevnar 13  Screening electrocardiogram  Screening mammography  Bone densitometry screening  Colorectal cancer screening  Diabetes screening  Glaucoma screening  Nutrition counseling   Advanced directives: given info/requested copies   Subjective:   [name] is a 70 y.o. female who presents for Medicare Annual Wellness Visit and 2 week follow-up of diabetes.  She has brought in blood sugar logs today which are showing average blood sugars of 150-200 in the mornings and 200-250 in the evenings.  She reports that she is frustrated with her diabetes.  She has had two issues with severe dehydration when she was on invokana and could not understand which she is now on insulin, metformin, januvia.  She reports that she can't understand why it is not responding  to treatments.  She notes that she has already had a stroke and is not interested in having another.  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  She has gotten Eat to Live and the End of Diabetes and is going to read them.  She is not eating well currently by her report.  She has been to the dietician in the past but just has not been implementing this to her practice.  She feels like if we increase her exercise her diabetes will do better.  She is playing with her insulin currently and doing between 10-15 units BID.       Medication Review Current Outpatient Prescriptions on File Prior to Visit  Medication Sig Dispense Refill  . amLODipine (NORVASC) 10 MG tablet Take 1 tablet (10 mg total) by mouth daily. 90 tablet 2  . aspirin 325 MG tablet Take 325 mg by mouth daily.     Marland Kitchen b complex vitamins capsule Take 1 capsule by mouth daily.    . Blood Glucose Monitoring Suppl (ACCU-CHEK AVIVA PLUS) W/DEVICE KIT Check blood sugar up to three times daily 1 kit 0  . Cholecalciferol (VITAMIN D3) 5000 UNITS TABS Take 1 capsule by mouth daily.    Marland Kitchen CINNAMON PO Take 1,000 mg by mouth 2 (two) times daily.     . citalopram (CELEXA) 40 MG tablet Take 1 tablet daily for Mood & Anxiety 90 tablet 1  . clonazePAM (KLONOPIN) 0.5 MG tablet Take 1 tablet (0.5 mg total) by mouth 2 (two) times daily. 60 tablet 5  . glucose blood (ACCU-CHEK AVIVA PLUS) test strip Use daily to check BS TID Dx. E11.22 300 each 1  . insulin NPH-regular Human (NOVOLIN 70/30) (70-30) 100 UNIT/ML injection Inject 25 units in the AM [place] with food, and inject 25 units [place] with evening meal. 60 mL 4  . Insulin Syringe-Needle U-100 31G X 15/64" 0.5 ML MISC Use two pens daily with insulin 100 each 3  . IRON PO Take by mouth daily.    Marland Kitchen JANUVIA 100 MG tablet TAKE 1/2 - 1 TABLET BY MOUTH EVERY DAY FOR DIABETES, AS DIRECTED 30 tablet 3  . losartan (COZAAR) 100 MG tablet Take 1 tablet (100 mg total) by mouth daily. 90 tablet 2  . Magnesium 500 MG CAPS Take 1 capsule by mouth daily.    . metFORMIN (GLUCOPHAGE-XR)  500 MG 24 hr tablet Take 2 tablets (1,000 mg total) by mouth 2 (two) times daily. 360 tablet 1  . Omega-3 Fatty Acids (FISH OIL) 1200 MG CAPS Take 1 capsule by mouth daily.    Marland Kitchen omeprazole (PRILOSEC) 40 MG capsule     . simvastatin (ZOCOR) 20 MG tablet TAKE 1 TABLET (20 MG TOTAL) BY MOUTH AT BEDTIME. 90 tablet 0  . vitamin E (VITAMIN E) 400 UNIT capsule Take 400 Units by mouth daily.     No current facility-administered medications on file prior to visit.     Allergies: Allergies  Allergen Reactions  . Plavix [Clopidogrel Bisulfate] Palpitations  . Amoxicillin Itching and Swelling    FACE & EYES SWELL  . Ace Inhibitors   . Lyrica [Pregabalin]   . Penicillins Other (See Comments)    REACTION: unspecified    Current Problems (verified) has Nontoxic multinodular goiter; CKD stage 3 due to type 2 diabetes mellitus ([place]); Glaucoma; Essential hypertension; CEREBRAL ANEURYSM; ESOPHAGEAL STRICTURE; Esophageal reflux; Diverticulosis  of large intestine; History of colonic polyps; Atherosclerosis of native arteries of extremity with intermittent claudication; Peripheral vascular disease due to secondary diabetes mellitus ([place]); Vitamin D deficiency; Medication management; CVA (cerebral infarction); Tobacco abuse; Generalized anxiety disorder; Depression, controlled; Encounter for Medicare annual wellness exam; Hyperlipidemia; and Insulin-requiring or dependent type II diabetes mellitus ([place]) on her problem list.  Screening Tests Immunization History  Administered Date(s) Administered  . DT 03/26/2015  . Influenza Whole 08/02/2007, 06/18/2009, 07/09/2013  . Influenza, High Dose Seasonal PF 08/05/2015, 07/22/2016  . Pneumococcal Conjugate-13 07/22/2016  . Pneumococcal Polysaccharide-23 02/08/2014  . Td 09/21/2004    Preventative care: Last colonoscopy: 2011 Last mammogram:  [id number]  Names of Other Physician/Practitioners you currently use: 1. [facility]- here for primary care 2. Dr.Hecker, eye doctor, last visit 2017 3. Dr. Nicki Reaper, dentist, last visit 2017 Patient Care Team: [name], MD as PCP - General (Internal Medicine) Michel Santee, MD as Consulting Physician (Neurology) Gean Birchwood, DPM as Consulting Physician (Podiatry) Serafina Mitchell, MD as Consulting Physician (Vascular Surgery) Minus Breeding, MD as Consulting Physician (Cardiology) Monna Fam, MD as Consulting Physician (Ophthalmology) Irene Shipper, MD as Consulting Physician (Gastroenterology)  Surgical: She  has a past surgical history that includes Cesarean section; Cholecystectomy; Knee surgery; rotator cuff surgery; Thyroid surgery; Hernia repair; Abdominal hysterectomy (1984); Femoral artery stent (05/11/11); Dental surgery (Aug. 16, 2013); Femoral-popliteal Bypass Graft (Nov. 26, 2013); Eye surgery (Nov. 2014); Esophagogastroduodenoscopy (07/2010); Colonoscopy (07/2010); abdominal aortagram (N/A, 01/04/2012); abdominal aortagram (N/A, 08/15/2012); lower extremity angiogram (Left, 08/15/2012); cataract surgery (Right, 10-22-2015); and cataract surgery (Left, 11-05-2015). Family Her family history includes CAD (age of onset: 36) in her brother; CAD (age of onset: 4) in her father and mother; Diabetes in her sister; Heart attack in her brother, brother, father, and mother; Heart disease in her brother, brother, father, and mother; Hypertension in her mother and sister. Social history  She reports that she has been smoking Cigarettes.  She has a 20.00 pack-year smoking history. She has never used smokeless tobacco. She reports that she does not drink alcohol or use drugs.  MEDICARE WELLNESS OBJECTIVES: Physical activity:   Cardiac risk factors:   Depression/mood screen:   Depression screen [facility] 2/9 07/22/2016  Decreased Interest 0  Down, Depressed, Hopeless 0  PHQ - 2 Score 0    ADLs:  In your present state of health, do you have any difficulty  performing the following activities: 07/22/2016 11/07/2015  Hearing? N N  Vision? N N  Difficulty concentrating or making decisions? N N  Walking or climbing stairs? N N  Dressing or bathing? N N  Doing errands, shopping? N N  Some recent data might be hidden     Cognitive Testing  Alert? Yes  Normal Appearance?Yes  Oriented to person? Yes  Place? Yes   Time? Yes  Recall of three objects?  Yes  Can perform simple calculations? Yes  Displays appropriate judgment?Yes  Can read the correct time from a watch face?Yes  EOL planning:   See Chart  Objective:   Today's Vitals   08/09/16 1633  BP: 138/64  Pulse: 74  Resp: 16  Temp: 98.2 F (36.8 C)  TempSrc: Temporal  Weight: 180 lb (81.6 kg)  Height: 5' 6" (1.676 m)   Body mass index is 29.05 kg/m.  General appearance: alert, no distress, WD/WN,  female HEENT: normocephalic, sclerae anicteric, TMs pearly, nares patent, no discharge or erythema, pharynx normal Oral cavity: MMM, no  lesions Neck: supple, no lymphadenopathy, no thyromegaly, no masses Heart: RRR, normal S1, S2, no murmurs Lungs: CTA bilaterally, no wheezes, rhonchi, or rales Abdomen: +bs, soft, non tender, non distended, no masses, no hepatomegaly, no splenomegaly Musculoskeletal: nontender, no swelling, no obvious deformity Extremities: no edema, no cyanosis, no clubbing Pulses: 2+ symmetric, upper and lower extremities, normal cap refill Neurological: alert, oriented x 3, CN2-12 intact, strength normal upper extremities and lower extremities, sensation normal throughout, DTRs 2+ throughout, no cerebellar signs, gait normal Psychiatric: normal affect, behavior normal, pleasant

## 2016-08-09 NOTE — Patient Instructions (Addendum)
Please start taking 10 units of insulin in the mornings unless blood sugar are over 180.  Ideally we want to eat about 30 minutes after you give yourself your insulin.   You can try doing overnight oats, a sliced apple with peanut butter or a slice of cheese.    You can continue your daily coffee.  Try the almond milk creamers with less fat and less calories.   Please use 12 units of insulin at bedtime.  You can go up to 15 units if morning blood sugars are over 250.    Please start your exercising.    Please check on the zostavax which is the shingles shot.    Start the flonase 2 sprays, tessalon, and take a generic for claritin, zyrtec, or allegra to help dry up congestion.

## 2016-08-19 ENCOUNTER — Other Ambulatory Visit: Payer: Self-pay | Admitting: Internal Medicine

## 2016-09-27 ENCOUNTER — Other Ambulatory Visit: Payer: Self-pay | Admitting: Physician Assistant

## 2016-09-27 DIAGNOSIS — F411 Generalized anxiety disorder: Secondary | ICD-10-CM

## 2016-09-27 NOTE — Telephone Encounter (Signed)
Please cal Clonazepam

## 2016-10-27 ENCOUNTER — Ambulatory Visit: Payer: Self-pay | Admitting: Internal Medicine

## 2016-11-01 DIAGNOSIS — H02403 Unspecified ptosis of bilateral eyelids: Secondary | ICD-10-CM | POA: Diagnosis not present

## 2016-11-01 DIAGNOSIS — H40023 Open angle with borderline findings, high risk, bilateral: Secondary | ICD-10-CM | POA: Diagnosis not present

## 2016-11-10 ENCOUNTER — Encounter: Payer: Self-pay | Admitting: Internal Medicine

## 2016-11-10 ENCOUNTER — Ambulatory Visit (INDEPENDENT_AMBULATORY_CARE_PROVIDER_SITE_OTHER): Payer: Medicare Other | Admitting: Internal Medicine

## 2016-11-10 VITALS — BP 130/70 | HR 80 | Temp 97.5°F | Resp 16 | Wt 175.4 lb

## 2016-11-10 DIAGNOSIS — K219 Gastro-esophageal reflux disease without esophagitis: Secondary | ICD-10-CM

## 2016-11-10 DIAGNOSIS — E1351 Other specified diabetes mellitus with diabetic peripheral angiopathy without gangrene: Secondary | ICD-10-CM | POA: Diagnosis not present

## 2016-11-10 DIAGNOSIS — I10 Essential (primary) hypertension: Secondary | ICD-10-CM

## 2016-11-10 DIAGNOSIS — E041 Nontoxic single thyroid nodule: Secondary | ICD-10-CM | POA: Diagnosis not present

## 2016-11-10 DIAGNOSIS — N183 Chronic kidney disease, stage 3 (moderate): Secondary | ICD-10-CM

## 2016-11-10 DIAGNOSIS — R1310 Dysphagia, unspecified: Secondary | ICD-10-CM | POA: Diagnosis not present

## 2016-11-10 DIAGNOSIS — Z79899 Other long term (current) drug therapy: Secondary | ICD-10-CM | POA: Diagnosis not present

## 2016-11-10 DIAGNOSIS — E1122 Type 2 diabetes mellitus with diabetic chronic kidney disease: Secondary | ICD-10-CM

## 2016-11-10 LAB — CBC WITH DIFFERENTIAL/PLATELET
BASOS PCT: 1 %
Basophils Absolute: 97 cells/uL (ref 0–200)
Eosinophils Absolute: 194 cells/uL (ref 15–500)
Eosinophils Relative: 2 %
HEMATOCRIT: 43.6 % (ref 35.0–45.0)
HEMOGLOBIN: 14.1 g/dL (ref 11.7–15.5)
LYMPHS ABS: 3589 {cells}/uL (ref 850–3900)
Lymphocytes Relative: 37 %
MCH: 30.5 pg (ref 27.0–33.0)
MCHC: 32.3 g/dL (ref 32.0–36.0)
MCV: 94.2 fL (ref 80.0–100.0)
MONO ABS: 582 {cells}/uL (ref 200–950)
MPV: 10.5 fL (ref 7.5–12.5)
Monocytes Relative: 6 %
Neutro Abs: 5238 cells/uL (ref 1500–7800)
Neutrophils Relative %: 54 %
Platelets: 315 10*3/uL (ref 140–400)
RBC: 4.63 MIL/uL (ref 3.80–5.10)
RDW: 13.3 % (ref 11.0–15.0)
WBC: 9.7 10*3/uL (ref 3.8–10.8)

## 2016-11-10 LAB — TSH: TSH: 1.77 m[IU]/L

## 2016-11-10 NOTE — Progress Notes (Signed)
Assessment and Plan:  Hypertension:  -well controlled -on ARB -Continue medication -monitor blood pressure at home. -Continue DASH diet -Reminder to go to the ER if any CP, SOB, nausea, dizziness, severe HA, changes vision/speech, left arm numbness and tingling and jaw pain.  Cholesterol -cont zocor -close to goal - Continue diet and exercise -Check cholesterol.   Diabetes with diabetic chronic kidney disease  -increase morning and evening insulin dose by 3 units weekly until morning CBGs 150.   -continue to work on diet and exercise -patient reports that she does have lapses in compliance.   -Continue diet and exercise.  -Check A1C  Vitamin D Def -continue medications.   Difficulty swallowing -TSH -T3 T4 -palpable thyroid nodule vs. Possible lymphadenopathy in anterior cervical chain -possible that this is due to GERD -consider switching to protonix vs. dexilant for PPI coverage -may need EGD to look for strictures, schaztski rings etc -allergies also needs to be considered as well  Continue diet and meds as discussed. Further disposition pending results of labs. Discussed med's effects and SE's.    HPI 71 y.o. female  presents for 3 month follow up with hypertension, hyperlipidemia, diabetes and vitamin D deficiency.   Her blood pressure has been controlled at home, today their BP is BP: 130/70.She does not workout. She denies chest pain, shortness of breath, dizziness.   She is on cholesterol medication and denies myalgias. Her cholesterol is at goal. The cholesterol was:  07/22/2016: Cholesterol 164; HDL 40; LDL Cholesterol 71; Triglycerides 264   She has been working on diet and exercise for diabetes with diabetic chronic kidney disease, she is on bASA, she is on ACE/ARB, and denies  foot ulcerations, hyperglycemia, hypoglycemia , increased appetite, nausea, paresthesia of the feet, polydipsia, polyuria, visual disturbances, vomiting and weight loss. Last A1C was:  07/22/2016: Hgb A1c MFr Bld 9.3  She is working really hard on her diet.  She reports this morning it was 199 despite working hard on the diet and exercise.  She reports that she is upset that she has not seen a reduction.  She is doing 12 units in the morning and she is doing 15 units in the evenings.  She is still taking the metformin.  She reports that she is trying to arrange her schedule so that her sugars are under better control.  At nighttime she is checking her blood sugars before dinner and they are running around 279 prior to a meal.  She generally eats dinner around 8-9.  She eats 3:30-4 pm.     Patient is on Vitamin D supplement. 07/22/2016: Vit D, 25-Hydroxy 22  Patient reports that she has been having some trouble with swallowing pills, she has some palpitations, and irritation with singing, and a choking sensation.  She also notes some ankle swelling.  She reports that she has had some trouble with her thyroid in the past.  She reports that she has been working on her diet.  She does take omeprazole for acid reflux.  She reports that there are times where she has some breakthrough relfux.  She reports that she does not vomit with this.  She is still having the sensation that she gets choked.     Current Medications:  Current Outpatient Prescriptions on File Prior to Visit  Medication Sig Dispense Refill  . amLODipine (NORVASC) 10 MG tablet Take 1 tablet (10 mg total) by mouth daily. 90 tablet 2  . aspirin 325 MG tablet Take 325 mg by mouth  daily.     . b complex vitamins capsule Take 1 capsule by mouth daily.    . benzonatate (TESSALON) 200 MG capsule Take 1 capsule (200 mg total) by mouth 3 (three) times daily as needed for cough. 60 capsule 0  . Blood Glucose Monitoring Suppl (ACCU-CHEK AVIVA PLUS) W/DEVICE KIT Check blood sugar up to three times daily 1 kit 0  . Cholecalciferol (VITAMIN D3) 5000 UNITS TABS Take 1 capsule by mouth daily.    Marland Kitchen CINNAMON PO Take 1,000 mg by mouth 2  (two) times daily.     . citalopram (CELEXA) 40 MG tablet Take 1 tablet daily for Mood & Anxiety 90 tablet 1  . clonazePAM (KLONOPIN) 0.5 MG tablet TAKE 1 TABLET BY MOUTH 2 TIMES DAILY 180 tablet 1  . fluticasone (FLONASE) 50 MCG/ACT nasal spray Place 2 sprays into both nostrils daily. 16 g 0  . glucose blood (ACCU-CHEK AVIVA PLUS) test strip Use daily to check BS TID Dx. E11.22 300 each 1  . insulin NPH-regular Human (NOVOLIN 70/30) (70-30) 100 UNIT/ML injection Inject 25 units in the AM [place] with food, and inject 25 units [place] with evening meal. 60 mL 4  . Insulin Syringe-Needle U-100 31G X 15/64" 0.5 ML MISC Use two pens daily with insulin 100 each 3  . IRON PO Take by mouth daily.    Marland Kitchen losartan (COZAAR) 100 MG tablet Take 1 tablet (100 mg total) by mouth daily. 90 tablet 2  . Magnesium 500 MG CAPS Take 1 capsule by mouth daily.    . metFORMIN (GLUCOPHAGE-XR) 500 MG 24 hr tablet Take 2 tablets (1,000 mg total) by mouth 2 (two) times daily. 360 tablet 1  . Omega-3 Fatty Acids (FISH OIL) 1200 MG CAPS Take 1 capsule by mouth daily.    Marland Kitchen omeprazole (PRILOSEC) 40 MG capsule     . simvastatin (ZOCOR) 20 MG tablet TAKE 1 TABLET (20 MG TOTAL) BY MOUTH AT BEDTIME. 90 tablet 2  . vitamin E (VITAMIN E) 400 UNIT capsule Take 400 Units by mouth daily.     No current facility-administered medications on file prior to visit.    Medical History:  Past Medical History:  Diagnosis Date  . Abnormal findings on esophagogastroduodenoscopy (EGD) 07/2010  . Aneurysm [facility]) 2013   Right Brain   . Anxiety   . Colon polyps   . Diabetes mellitus 1998  . Diverticulosis   . GERD (gastroesophageal reflux disease)   . Hemorrhoids   . Hiatal hernia   . Hyperlipidemia   . Hypertension 2000  . Migraines   . Peripheral vascular disease ([place])   . Phlebitis    30 years ago  left leg  . Status post colonoscopy 07/2010  . Stroke [name])    multiple mini strokes ( brain aneurysm )   Allergies:  Allergies  Allergen  Reactions  . Plavix [Clopidogrel Bisulfate] Palpitations  . Amoxicillin Itching and Swelling    FACE & EYES SWELL  . Ace Inhibitors   . Lyrica [Pregabalin]   . Penicillins Other (See Comments)    REACTION: unspecified     Review of Systems:  Review of Systems  Constitutional: Positive for chills. Negative for diaphoresis, fever and malaise/fatigue.  HENT: Negative for congestion, ear pain, hearing loss and sore throat.        Hoarse  Eyes: Negative.   Respiratory: Positive for wheezing. Negative for cough, shortness of breath and stridor.   Cardiovascular: Positive for leg swelling. Negative for chest  pain and palpitations.  Gastrointestinal: Positive for diarrhea and heartburn. Negative for abdominal pain, blood in stool, constipation, melena, nausea and vomiting.  Genitourinary: Negative.   Skin: Negative.   Neurological: Negative for dizziness, tremors and loss of consciousness.  Psychiatric/Behavioral: Negative for depression. The patient is not nervous/anxious and does not have insomnia.     Family history- Review and unchanged  Social history- Review and unchanged  Physical Exam: BP 130/70   Pulse 80   Temp 97.5 F (36.4 C)   Resp 16   Wt 175 lb 6.4 oz (79.6 kg)   SpO2 97%   BMI 28.31 kg/m  Wt Readings from Last 3 Encounters:  11/10/16 175 lb 6.4 oz (79.6 kg)  08/09/16 180 lb (81.6 kg)  07/22/16 180 lb 12.8 oz (82 kg)   General Appearance: Well nourished well developed, non-toxic appearing, in no apparent distress. Eyes: PERRLA, EOMs, conjunctiva no swelling or erythema ENT/Mouth: Ear canals clear with no erythema, swelling, or discharge.  TMs normal bilaterally, oropharynx clear, moist, with no exudate.   Neck: Supple, thyroid with small right sided lateral nodule.  Non-tender to palpation.  Firm.  No JVD, no cervical adenopathy.  Respiratory: Respiratory effort normal, breath sounds clear A&P, no wheeze, rhonchi or rales noted.  No retractions, no accessory  muscle usage Cardio: RRR with no MRGs. No noted edema.  Abdomen: Soft, + BS.  Non tender, no guarding, rebound, hernias, masses. Musculoskeletal: Full ROM, 5/5 strength, Normal gait Skin: Warm, dry without rashes, lesions, ecchymosis.  Neuro: Awake and oriented X 3, Cranial nerves intact. No cerebellar symptoms.  Psych: normal affect, Insight and Judgment appropriate.    Starlyn Skeans, PA-C 4:27 PM [facility]

## 2016-11-11 LAB — HEPATIC FUNCTION PANEL
ALK PHOS: 89 U/L (ref 33–130)
ALT: 13 U/L (ref 6–29)
AST: 19 U/L (ref 10–35)
Albumin: 3.5 g/dL — ABNORMAL LOW (ref 3.6–5.1)
BILIRUBIN DIRECT: 0.1 mg/dL (ref <=0.2)
BILIRUBIN INDIRECT: 0.2 mg/dL (ref 0.2–1.2)
TOTAL PROTEIN: 6.6 g/dL (ref 6.1–8.1)
Total Bilirubin: 0.3 mg/dL (ref 0.2–1.2)

## 2016-11-11 LAB — T3: T3, Total: 88 ng/dL (ref 76–181)

## 2016-11-11 LAB — T4: T4, Total: 6.1 ug/dL (ref 4.5–12.0)

## 2016-11-11 LAB — BASIC METABOLIC PANEL WITH GFR
BUN: 16 mg/dL (ref 7–25)
CO2: 30 mmol/L (ref 20–31)
Calcium: 9.4 mg/dL (ref 8.6–10.4)
Chloride: 103 mmol/L (ref 98–110)
Creat: 1.17 mg/dL — ABNORMAL HIGH (ref 0.60–0.93)
GFR, EST AFRICAN AMERICAN: 55 mL/min — AB (ref >=60)
GFR, EST NON AFRICAN AMERICAN: 47 mL/min — AB (ref >=60)
GLUCOSE: 210 mg/dL — AB (ref 65–99)
POTASSIUM: 4.8 mmol/L (ref 3.5–5.3)
SODIUM: 140 mmol/L (ref 135–146)

## 2016-11-11 LAB — HEMOGLOBIN A1C
Hgb A1c MFr Bld: 10.9 % — ABNORMAL HIGH (ref <5.7)
Mean Plasma Glucose: 266 mg/dL

## 2016-11-17 ENCOUNTER — Ambulatory Visit
Admission: RE | Admit: 2016-11-17 | Discharge: 2016-11-17 | Disposition: A | Discharge status: Home / Self Care | Payer: Medicare Other | Source: Ambulatory Visit | Attending: Internal Medicine | Admitting: Internal Medicine

## 2016-11-17 DIAGNOSIS — E041 Nontoxic single thyroid nodule: Secondary | ICD-10-CM

## 2016-11-17 DIAGNOSIS — E042 Nontoxic multinodular goiter: Secondary | ICD-10-CM | POA: Diagnosis not present

## 2016-12-16 ENCOUNTER — Encounter: Payer: Self-pay | Admitting: Podiatry

## 2016-12-16 ENCOUNTER — Ambulatory Visit (INDEPENDENT_AMBULATORY_CARE_PROVIDER_SITE_OTHER): Payer: Medicare Other | Admitting: Podiatry

## 2016-12-16 DIAGNOSIS — B351 Tinea unguium: Secondary | ICD-10-CM

## 2016-12-16 DIAGNOSIS — M79676 Pain in unspecified toe(s): Secondary | ICD-10-CM | POA: Diagnosis not present

## 2016-12-16 DIAGNOSIS — I739 Peripheral vascular disease, unspecified: Secondary | ICD-10-CM

## 2016-12-16 DIAGNOSIS — E1149 Type 2 diabetes mellitus with other diabetic neurological complication: Secondary | ICD-10-CM

## 2016-12-16 NOTE — Progress Notes (Signed)
Patient ID: [name], female   DOB: 06-02-1946, 71 y.o.   MRN: [id number] Complaint:  Visit Type: Patient returns to my office for continued preventative foot care services. Complaint: Patient states" my nails have grown long and thick and become painful to walk and wear shoes" Patient has been diagnosed with DM with neuropathy.. The patient presents for preventative foot care services. No changes to ROS  Podiatric Exam: Vascular: dorsalis pedis and posterior tibial pulses are palpable bilateral. Capillary return is immediate. Temperature gradient is WNL. Skin turgor WNL  Sensorium: Diminished Semmes Weinstein monofilament test. Normal tactile sensation bilaterally. Nail Exam: Pt has thick disfigured discolored nails with subungual debris noted bilateral entire nail hallux through fifth toenails Ulcer Exam: There is no evidence of ulcer or pre-ulcerative changes or infection. Orthopedic Exam: Muscle tone and strength are WNL. No limitations in general ROM. No crepitus or effusions noted. Foot type and digits show no abnormalities. Bony prominences are unremarkable. Hammer toes B/L with plantarflexed metatarsals B/L.  Adductovarus fifth toe right foot. Skin: No Porokeratosis. No infection or ulcers.  Listers corn noted fifth toe right foot. Diagnosis:  Onychomycosis, , Pain in right toe, pain in left toes  Treatment & Plan Procedures and Treatment: Consent by patient was obtained for treatment procedures. The patient understood the discussion of treatment and procedures well. All questions were answered thoroughly reviewed. Debridement of mycotic and hypertrophic toenails, 1 through 5 bilateral and clearing of subungual debris. No ulceration, no infection noted.  Debride listers corn. Return Visit-Office Procedure: Patient instructed to return to the office for a follow up visit 3 months for continued evaluation and treatment.    Gardiner Barefoot DPM

## 2016-12-20 ENCOUNTER — Ambulatory Visit: Payer: Medicare Other | Admitting: Family

## 2016-12-20 ENCOUNTER — Encounter (HOSPITAL_COMMUNITY): Payer: Medicare Other

## 2017-01-05 ENCOUNTER — Encounter: Payer: Self-pay | Admitting: Family

## 2017-01-13 ENCOUNTER — Ambulatory Visit (HOSPITAL_COMMUNITY)
Admission: RE | Admit: 2017-01-13 | Discharge: 2017-01-13 | Disposition: A | Discharge status: Home / Self Care | Payer: Medicare Other | Source: Ambulatory Visit | Attending: Family | Admitting: Family

## 2017-01-13 ENCOUNTER — Encounter: Payer: Self-pay | Admitting: Family

## 2017-01-13 ENCOUNTER — Ambulatory Visit (INDEPENDENT_AMBULATORY_CARE_PROVIDER_SITE_OTHER): Payer: Medicare Other | Admitting: Family

## 2017-01-13 VITALS — BP 168/78 | HR 66 | Temp 98.7°F | Resp 16 | Ht 66.0 in | Wt 175.0 lb

## 2017-01-13 DIAGNOSIS — F17200 Nicotine dependence, unspecified, uncomplicated: Secondary | ICD-10-CM | POA: Diagnosis not present

## 2017-01-13 DIAGNOSIS — Z959 Presence of cardiac and vascular implant and graft, unspecified: Secondary | ICD-10-CM

## 2017-01-13 DIAGNOSIS — I779 Disorder of arteries and arterioles, unspecified: Secondary | ICD-10-CM | POA: Diagnosis not present

## 2017-01-13 DIAGNOSIS — I739 Peripheral vascular disease, unspecified: Secondary | ICD-10-CM | POA: Diagnosis not present

## 2017-01-13 DIAGNOSIS — E1151 Type 2 diabetes mellitus with diabetic peripheral angiopathy without gangrene: Secondary | ICD-10-CM | POA: Diagnosis not present

## 2017-01-13 NOTE — Progress Notes (Signed)
[facility] [place]   CC: Follow up peripheral artery occlusive disease  History of Present Illness [name] is a 71 y.o. female patient of Dr. Trula Slade who is s/p stenting of her left superficial femoral artery in September of 2012. This alleviated her symptoms initially. 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   She has a known cerebral aneurysm followed by her neurologist, Dr. Michel Santee, who is also following her carotid arteries, has had several TIA's.   She was hospitalized January 2016 at [facility] then [facility] with loss of balance, states she was told that she did not have a stroke, was told that she was dehydrated.  She is walking better. Her medications were adjusted.   Her feet ache and feel cold all the time, she sees a podiatrist.   She has a tired feeling in her legs if she walks a long distance, relieved by rest; she does report pain in left hip and thigh with walking at times , denies non-healing wounds.   Dr. Trula Slade stopped the Pletal since it did not help her claudication symptoms.  She reports arthritis pain in both knees.  Her husband, 2 family members, and her best friend have serious health issues, states this is why her blood pressure is elevated. But states her blood pressure at her PCP's office is about 132/78.   She states she has stage 3 CKD.  Her A1C was 8.26 September 2014 (review of records) 9.3 in February 2017, 10.9 on 11-10-16; states she has been tried on multiple DM medications. Pt smoker: smoker (1/2 ppd x 40 yrs, is trying to quit)   Pt meds include:  Statin :Yes  ASA: Yes  Other anticoagulants/antiplatelets: allergic to Plavix     Past Medical History:  Diagnosis Date  . Abnormal findings on esophagogastroduodenoscopy (EGD) 07/2010  . Aneurysm [facility]) 2013   Right Brain   . Anxiety   . Colon polyps   . Diabetes  mellitus 1998  . Diverticulosis   . GERD (gastroesophageal reflux disease)   . Hemorrhoids   . Hiatal hernia   . Hyperlipidemia   . Hypertension 2000  . Migraines   . Peripheral vascular disease ([place])   . Phlebitis    30 years ago  left leg  . Status post colonoscopy 07/2010  . Stroke [facility])    multiple mini strokes ( brain aneurysm )    Social History Social History  Substance Use Topics  . Smoking status: Current Every Day Smoker    Packs/day: 0.50    Years: 40.00    Types: Cigarettes  . Smokeless tobacco: Never Used  . Alcohol use No    Family History Family History  Problem Relation Age of Onset  . CAD Mother 15    Died of MI  . Hypertension Mother   . Heart attack Mother   . Heart disease Mother   . CAD Father 11    Died of MI  . Heart disease Father   . Heart attack Father   . CAD Brother 18    Two brothers died of MI  . Heart attack Brother   . Heart disease Brother     Amputation  . Diabetes Sister   . Hypertension Sister   . Heart attack Brother   . Heart disease Brother     Past Surgical History:  Procedure Laterality Date  . ABDOMINAL AORTAGRAM  N/A 01/04/2012   Procedure: ABDOMINAL Maxcine Ham;  Surgeon: Serafina Mitchell, MD;  Location: [facility] C[facility] LAB;  Service: Cardiovascular;  Laterality: N/A;  . ABDOMINAL AORTAGRAM N/A 08/15/2012   Procedure: ABDOMINAL Maxcine Ham;  Surgeon: Serafina Mitchell, MD;  Location: [facility] CATH LAB;  Service: Cardiovascular;  Laterality: N/A;  . ABDOMINAL HYSTERECTOMY  1984  . cataract surgery Right 10-22-2015  . cataract surgery Left 11-05-2015  . CESAREAN SECTION    . CHOLECYSTECTOMY    . COLONOSCOPY  07/2010  . DENTAL SURGERY  Aug. 16, 2013   left lower   . ESOPHAGOGASTRODUODENOSCOPY  07/2010  . EYE SURGERY  Nov. 2014   Laser-Glaucoma  . FEMORAL ARTERY STENT  05/11/11   Left superficial femoral and popliteal artery  . FEMORAL-POPLITEAL BYPASS GRAFT  Nov. 26, 2013   Left Angiogram-   . HERNIA REPAIR     times two  .  KNEE SURGERY    . LOWER EXTREMITY ANGIOGRAM Left 08/15/2012   Procedure: LOWER EXTREMITY ANGIOGRAM;  Surgeon: Serafina Mitchell, MD;  Location: [facility] CATH LAB;  Service: Cardiovascular;  Laterality: Left;  lt leg angio  . rotator cuff surgery    . THYROID SURGERY      Allergies  Allergen Reactions  . Plavix [Clopidogrel Bisulfate] Palpitations  . Amoxicillin Itching and Swelling    FACE & EYES SWELL  . Ace Inhibitors   . Lyrica [Pregabalin]   . Penicillins Other (See Comments)    REACTION: unspecified    Current Outpatient Prescriptions  Medication Sig Dispense Refill  . amLODipine (NORVASC) 10 MG tablet Take 1 tablet (10 mg total) by mouth daily. 90 tablet 2  . aspirin 325 MG tablet Take 325 mg by mouth daily.     Marland Kitchen b complex vitamins capsule Take 1 capsule by mouth daily.    . Blood Glucose Monitoring Suppl (ACCU-CHEK AVIVA PLUS) W/DEVICE KIT Check blood sugar up to three times daily 1 kit 0  . Cholecalciferol (VITAMIN D3) 5000 UNITS TABS Take 1 capsule by mouth daily.    Marland Kitchen CINNAMON PO Take 1,000 mg by mouth 2 (two) times daily.     . citalopram (CELEXA) 40 MG tablet Take 1 tablet daily for Mood & Anxiety 90 tablet 1  . clonazePAM (KLONOPIN) 0.5 MG tablet TAKE 1 TABLET BY MOUTH 2 TIMES DAILY 180 tablet 1  . fluticasone (FLONASE) 50 MCG/ACT nasal spray Place 2 sprays into both nostrils daily. 16 g 0  . glucose blood (ACCU-CHEK AVIVA PLUS) test strip Use daily to check BS TID Dx. E11.22 300 each 1  . insulin NPH-regular Human (NOVOLIN 70/30) (70-30) 100 UNIT/ML injection Inject 25 units in the AM [place] with food, and inject 25 units [place] with evening meal. 60 mL 4  . Insulin Syringe-Needle U-100 31G X 15/64" 0.5 ML MISC Use two pens daily with insulin 100 each 3  . IRON PO Take by mouth daily.    Marland Kitchen losartan (COZAAR) 100 MG tablet Take 1 tablet (100 mg total) by mouth daily. 90 tablet 2  . Magnesium 500 MG CAPS Take 1 capsule by mouth daily.    . metFORMIN (GLUCOPHAGE-XR) 500 MG 24 hr tablet  Take 2 tablets (1,000 mg total) by mouth 2 (two) times daily. 360 tablet 1  . Omega-3 Fatty Acids (FISH OIL) 1200 MG CAPS Take 1 capsule by mouth daily.    Marland Kitchen omeprazole (PRILOSEC) 40 MG capsule     . simvastatin (ZOCOR) 20 MG tablet TAKE 1 TABLET (20 MG  TOTAL) BY MOUTH AT BEDTIME. 90 tablet 2  . vitamin E (VITAMIN E) 400 UNIT capsule Take 400 Units by mouth daily.     No current facility-administered medications for this visit.     ROS: See HPI for pertinent positives and negatives.   Physical Examination  Vitals:   01/13/17 1535 01/13/17 1542  BP: (!) 160/75 (!) 168/78  Pulse: 66   Resp: 16   Temp: 98.7 F (37.1 C)   TempSrc: Oral   SpO2: 92%   Weight: 175 lb (79.4 kg)   Height: 5' 6"  (1.676 m)    Body mass index is 28.25 kg/m.  General: A&O x 3, WDWN,  Gait: normal  Eyes: PERRLA  Pulmonary: Respirations are non labored, CTAB, without wheezes, rales, or rhonchi  Cardiac: regular rythm, no detected murmur   Carotid Bruits Right Left   Positive Positive  Aorta is not palpable. Radial pulses: 1+ palpable bilaterally                            VASCULAR EXAM: Extremities without ischemic changes, without Gangrene; without open wounds.                                                                                                          LE Pulses Right Left       FEMORAL  2+ palpable  2+palpable        POPLITEAL  not palpable   not palpable       POSTERIOR TIBIAL  not palpable   not palpable        DORSALIS PEDIS      ANTERIOR TIBIAL faintly palpable  faintly palpable     Abdomen: soft, non tender, no palpable masses.  Skin: no rashes, no ulcers.  Musculoskeletal: no muscle wasting or atrophy.  Neurologic: A&O X 3; Appropriate Affect ; SENSATION: normal; MOTOR FUNCTION: moving all extremities equally, motor strength 4/5 in UE's, 4/5 in LE's. Speech is fluent/normal. CN 2-12 intact.   ASSESSMENT: [name] is a 71 y.o. female who is s/p  stenting of her left superficial femoral artery in September of 2012. This alleviated her symptoms initially. 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  She has a known cerebral aneurysm followed by her neurologist who is also following her carotid arteries, has had several TIA's.  She has a tired feeling in her legs if she walks a long distance, relieved by rest, no signs of ischemia in her feet or legs. Femoral pulses are 2+ palpable bilaterally. She has intermittent left hip pain that seems more musculoskeletal in origin.   Her atherosclerotic risk factors include worsening uncontrolled DM and active smoking; see Plan.  ABI (01/13/17: Right: 0.77 (0.67, 12-15-15), waveforms: monophasic; TBI: 0.45 (0.49, 12-15-15) Left: 0.61 (0.55, 12-15-15), waveforms: monophasic; TBI: 0.38 (0.40, 12-15-15)  ABI's have improved slightly bilaterally, TBI 's remain stable compared to a year ago, moderate bilateral arterial occlusive disease, all monophasic  waveforms.     PLAN:  At pt request, referral to endocrinologist to help her manage her uncontrolled DM.   The patient was counseled re smoking cessation and given several free resources re smoking cessation. Graduated walking program.    Based on the patient's vascular studies and examination, pt will return to clinic in 6 months with ABI's and see NP, she knows to return sooner if needed.   I discussed in depth with the patient the nature of atherosclerosis, and emphasized the importance of maximal medical management including strict control of blood pressure, blood glucose, and lipid levels, obtaining regular exercise, and cessation of smoking.  The patient is aware that without maximal medical management the underlying atherosclerotic disease process will progress, limiting the benefit of any interventions.  The patient was given information about PAD including signs, symptoms,  treatment, what symptoms should prompt the patient to seek immediate medical care, and risk reduction measures to take.  Clemon Chambers, RN, MSN, FNP-C [facility] Phone: [phone]  Clinic MD: Bridgett Larsson on call  01/13/17 3:49 PM

## 2017-01-13 NOTE — Patient Instructions (Signed)
Peripheral Vascular Disease Peripheral vascular disease (PVD) is a disease of the blood vessels that are not part of your heart and brain. A simple term for PVD is poor circulation. In most cases, PVD narrows the blood vessels that carry blood from your heart to the rest of your body. This can result in a decreased supply of blood to your arms, legs, and internal organs, like your stomach or kidneys. However, it most often affects a person's lower legs and feet. There are two types of PVD.  Organic PVD. This is the more common type. It is caused by damage to the structure of blood vessels.  Functional PVD. This is caused by conditions that make blood vessels contract and tighten (spasm). Without treatment, PVD tends to get worse over time. PVD can also lead to acute ischemic limb. This is when an arm or limb suddenly has trouble getting enough blood. This is a medical emergency. Follow these instructions at home:  Take medicines only as told by your doctor.  Do not use any tobacco products, including cigarettes, chewing tobacco, or electronic cigarettes. If you need help quitting, ask your doctor.  Lose weight if you are overweight, and maintain a healthy weight as told by your doctor.  Eat a diet that is low in fat and cholesterol. If you need help, ask your doctor.  Exercise regularly. Ask your doctor for some good activities for you.  Take good care of your feet.  Wear comfortable shoes that fit well.  Check your feet often for any cuts or sores. Contact a doctor if:  You have cramps in your legs while walking.  You have leg pain when you are at rest.  You have coldness in a leg or foot.  Your skin changes.  You are unable to get or have an erection (erectile dysfunction).  You have cuts or sores on your feet that are not healing. Get help right away if:  Your arm or leg turns cold and blue.  Your arms or legs become red, warm, swollen, painful, or numb.  You have  chest pain or trouble breathing.  You suddenly have weakness in your face, arm, or leg.  You become very confused or you cannot speak.  You suddenly have a very bad headache.  You suddenly cannot see. This information is not intended to replace advice given to you by your health care provider. Make sure you discuss any questions you have with your health care provider. Document Released: 12/01/2009 Document Revised: 02/12/2016 Document Reviewed: 02/14/2014 Elsevier Interactive Patient Education  2017 Elsevier Inc.      Steps to Quit Smoking Smoking tobacco can be bad for your health. It can also affect almost every organ in your body. Smoking puts you and people around you at risk for many serious long-lasting (chronic) diseases. Quitting smoking is hard, but it is one of the best things that you can do for your health. It is never too late to quit. What are the benefits of quitting smoking? When you quit smoking, you lower your risk for getting serious diseases and conditions. They can include:  Lung cancer or lung disease.  Heart disease.  Stroke.  Heart attack.  Not being able to have children (infertility).  Weak bones (osteoporosis) and broken bones (fractures). If you have coughing, wheezing, and shortness of breath, those symptoms may get better when you quit. You may also get sick less often. If you are pregnant, quitting smoking can help to lower your chances   of having a baby of low birth weight. What can I do to help me quit smoking? Talk with your doctor about what can help you quit smoking. Some things you can do (strategies) include:  Quitting smoking totally, instead of slowly cutting back how much you smoke over a period of time.  Going to in-person counseling. You are more likely to quit if you go to many counseling sessions.  Using resources and support systems, such as:  Online chats with a counselor.  Phone quitlines.  Printed self-help  materials.  Support groups or group counseling.  Text messaging programs.  Mobile phone apps or applications.  Taking medicines. Some of these medicines may have nicotine in them. If you are pregnant or breastfeeding, do not take any medicines to quit smoking unless your doctor says it is okay. Talk with your doctor about counseling or other things that can help you. Talk with your doctor about using more than one strategy at the same time, such as taking medicines while you are also going to in-person counseling. This can help make quitting easier. What things can I do to make it easier to quit? Quitting smoking might feel very hard at first, but there is a lot that you can do to make it easier. Take these steps:  Talk to your family and friends. Ask them to support and encourage you.  Call phone quitlines, reach out to support groups, or work with a counselor.  Ask people who smoke to not smoke around you.  Avoid places that make you want (trigger) to smoke, such as:  Bars.  Parties.  Smoke-break areas at work.  Spend time with people who do not smoke.  Lower the stress in your life. Stress can make you want to smoke. Try these things to help your stress:  Getting regular exercise.  Deep-breathing exercises.  Yoga.  Meditating.  Doing a body scan. To do this, close your eyes, focus on one area of your body at a time from head to toe, and notice which parts of your body are tense. Try to relax the muscles in those areas.  Download or buy apps on your mobile phone or tablet that can help you stick to your quit plan. There are many free apps, such as QuitGuide from the CDC (Centers for Disease Control and Prevention). You can find more support from smokefree.gov and other websites. This information is not intended to replace advice given to you by your health care provider. Make sure you discuss any questions you have with your health care provider. Document Released:  07/03/2009 Document Revised: 05/04/2016 Document Reviewed: 01/21/2015 Elsevier Interactive Patient Education  2017 Elsevier Inc.  

## 2017-01-14 NOTE — Addendum Note (Signed)
Addended by: Lianne Cure A on: 01/14/2017 09:02 AM   Modules accepted: Orders

## 2017-01-17 ENCOUNTER — Other Ambulatory Visit: Payer: Self-pay | Admitting: Internal Medicine

## 2017-01-17 DIAGNOSIS — F419 Anxiety disorder, unspecified: Secondary | ICD-10-CM

## 2017-01-25 ENCOUNTER — Inpatient Hospital Stay (HOSPITAL_COMMUNITY)
Admission: EM | Admit: 2017-01-25 | Discharge: 2017-01-27 | DRG: 065 | Disposition: A | Discharge status: Home Health | Payer: Medicare Other | Attending: Internal Medicine | Admitting: Internal Medicine

## 2017-01-25 ENCOUNTER — Emergency Department (HOSPITAL_COMMUNITY): Payer: Medicare Other

## 2017-01-25 ENCOUNTER — Inpatient Hospital Stay (HOSPITAL_COMMUNITY): Payer: Medicare Other

## 2017-01-25 ENCOUNTER — Encounter (HOSPITAL_COMMUNITY): Payer: Self-pay | Admitting: Emergency Medicine

## 2017-01-25 DIAGNOSIS — Z9049 Acquired absence of other specified parts of digestive tract: Secondary | ICD-10-CM

## 2017-01-25 DIAGNOSIS — Z90710 Acquired absence of both cervix and uterus: Secondary | ICD-10-CM

## 2017-01-25 DIAGNOSIS — Z8249 Family history of ischemic heart disease and other diseases of the circulatory system: Secondary | ICD-10-CM | POA: Diagnosis not present

## 2017-01-25 DIAGNOSIS — G8191 Hemiplegia, unspecified affecting right dominant side: Secondary | ICD-10-CM | POA: Diagnosis present

## 2017-01-25 DIAGNOSIS — I6300 Cerebral infarction due to thrombosis of unspecified precerebral artery: Secondary | ICD-10-CM | POA: Diagnosis not present

## 2017-01-25 DIAGNOSIS — E1151 Type 2 diabetes mellitus with diabetic peripheral angiopathy without gangrene: Secondary | ICD-10-CM | POA: Diagnosis present

## 2017-01-25 DIAGNOSIS — F17210 Nicotine dependence, cigarettes, uncomplicated: Secondary | ICD-10-CM | POA: Diagnosis not present

## 2017-01-25 DIAGNOSIS — E785 Hyperlipidemia, unspecified: Secondary | ICD-10-CM | POA: Diagnosis present

## 2017-01-25 DIAGNOSIS — N183 Chronic kidney disease, stage 3 (moderate): Secondary | ICD-10-CM | POA: Diagnosis present

## 2017-01-25 DIAGNOSIS — Z95828 Presence of other vascular implants and grafts: Secondary | ICD-10-CM

## 2017-01-25 DIAGNOSIS — F418 Other specified anxiety disorders: Secondary | ICD-10-CM

## 2017-01-25 DIAGNOSIS — I639 Cerebral infarction, unspecified: Principal | ICD-10-CM | POA: Diagnosis present

## 2017-01-25 DIAGNOSIS — K219 Gastro-esophageal reflux disease without esophagitis: Secondary | ICD-10-CM | POA: Diagnosis present

## 2017-01-25 DIAGNOSIS — Z7982 Long term (current) use of aspirin: Secondary | ICD-10-CM | POA: Diagnosis not present

## 2017-01-25 DIAGNOSIS — E1122 Type 2 diabetes mellitus with diabetic chronic kidney disease: Secondary | ICD-10-CM | POA: Diagnosis present

## 2017-01-25 DIAGNOSIS — Z881 Allergy status to other antibiotic agents status: Secondary | ICD-10-CM

## 2017-01-25 DIAGNOSIS — Z880 Allergy status to penicillin: Secondary | ICD-10-CM | POA: Diagnosis not present

## 2017-01-25 DIAGNOSIS — Z79899 Other long term (current) drug therapy: Secondary | ICD-10-CM

## 2017-01-25 DIAGNOSIS — I129 Hypertensive chronic kidney disease with stage 1 through stage 4 chronic kidney disease, or unspecified chronic kidney disease: Secondary | ICD-10-CM | POA: Diagnosis present

## 2017-01-25 DIAGNOSIS — I63332 Cerebral infarction due to thrombosis of left posterior cerebral artery: Secondary | ICD-10-CM | POA: Diagnosis not present

## 2017-01-25 DIAGNOSIS — I10 Essential (primary) hypertension: Secondary | ICD-10-CM | POA: Diagnosis present

## 2017-01-25 DIAGNOSIS — R29810 Facial weakness: Secondary | ICD-10-CM | POA: Diagnosis present

## 2017-01-25 DIAGNOSIS — R531 Weakness: Secondary | ICD-10-CM | POA: Diagnosis not present

## 2017-01-25 DIAGNOSIS — Z888 Allergy status to other drugs, medicaments and biological substances status: Secondary | ICD-10-CM | POA: Diagnosis not present

## 2017-01-25 DIAGNOSIS — I6789 Other cerebrovascular disease: Secondary | ICD-10-CM | POA: Diagnosis not present

## 2017-01-25 DIAGNOSIS — I6523 Occlusion and stenosis of bilateral carotid arteries: Secondary | ICD-10-CM | POA: Diagnosis not present

## 2017-01-25 DIAGNOSIS — Z8673 Personal history of transient ischemic attack (TIA), and cerebral infarction without residual deficits: Secondary | ICD-10-CM

## 2017-01-25 DIAGNOSIS — M6281 Muscle weakness (generalized): Secondary | ICD-10-CM | POA: Diagnosis not present

## 2017-01-25 DIAGNOSIS — I671 Cerebral aneurysm, nonruptured: Secondary | ICD-10-CM | POA: Diagnosis present

## 2017-01-25 DIAGNOSIS — Z794 Long term (current) use of insulin: Secondary | ICD-10-CM

## 2017-01-25 DIAGNOSIS — E119 Type 2 diabetes mellitus without complications: Secondary | ICD-10-CM

## 2017-01-25 DIAGNOSIS — E118 Type 2 diabetes mellitus with unspecified complications: Secondary | ICD-10-CM | POA: Diagnosis not present

## 2017-01-25 DIAGNOSIS — R269 Unspecified abnormalities of gait and mobility: Secondary | ICD-10-CM | POA: Diagnosis not present

## 2017-01-25 DIAGNOSIS — I69398 Other sequelae of cerebral infarction: Secondary | ICD-10-CM | POA: Diagnosis not present

## 2017-01-25 LAB — COMPREHENSIVE METABOLIC PANEL
ALK PHOS: 101 U/L (ref 38–126)
ALT: 13 U/L — AB (ref 14–54)
ANION GAP: 9 (ref 5–15)
AST: 20 U/L (ref 15–41)
Albumin: 3.2 g/dL — ABNORMAL LOW (ref 3.5–5.0)
BUN: 19 mg/dL (ref 6–20)
CO2: 24 mmol/L (ref 22–32)
Calcium: 9 mg/dL (ref 8.9–10.3)
Chloride: 104 mmol/L (ref 101–111)
Creatinine, Ser: 1.08 mg/dL — ABNORMAL HIGH (ref 0.44–1.00)
GFR, EST AFRICAN AMERICAN: 59 mL/min — AB (ref >60)
GFR, EST NON AFRICAN AMERICAN: 51 mL/min — AB (ref >60)
Glucose, Bld: 234 mg/dL — ABNORMAL HIGH (ref 65–99)
Potassium: 3.9 mmol/L (ref 3.5–5.1)
SODIUM: 137 mmol/L (ref 135–145)
TOTAL PROTEIN: 6.7 g/dL (ref 6.5–8.1)
Total Bilirubin: 0.8 mg/dL (ref 0.3–1.2)

## 2017-01-25 LAB — GLUCOSE, CAPILLARY
GLUCOSE-CAPILLARY: 211 mg/dL — AB (ref 65–99)
GLUCOSE-CAPILLARY: 167 mg/dL — AB (ref 65–99)
GLUCOSE-CAPILLARY: 266 mg/dL — AB (ref 65–99)

## 2017-01-25 LAB — I-STAT TROPONIN, ED: Troponin i, poc: 0 ng/mL (ref 0.00–0.08)

## 2017-01-25 LAB — CBC
HCT: 43.9 % (ref 36.0–46.0)
Hemoglobin: 14.6 g/dL (ref 12.0–15.0)
MCH: 31.3 pg (ref 26.0–34.0)
MCHC: 33.3 g/dL (ref 30.0–36.0)
MCV: 94.2 fL (ref 78.0–100.0)
PLATELETS: 313 10*3/uL (ref 150–400)
RBC: 4.66 MIL/uL (ref 3.87–5.11)
RDW: 12.1 % (ref 11.5–15.5)
WBC: 10.1 10*3/uL (ref 4.0–10.5)

## 2017-01-25 LAB — I-STAT CHEM 8, ED
BUN: 24 mg/dL — AB (ref 6–20)
CHLORIDE: 103 mmol/L (ref 101–111)
Calcium, Ion: 1.16 mmol/L (ref 1.15–1.40)
Creatinine, Ser: 1 mg/dL (ref 0.44–1.00)
Glucose, Bld: 234 mg/dL — ABNORMAL HIGH (ref 65–99)
HEMATOCRIT: 45 % (ref 36.0–46.0)
Hemoglobin: 15.3 g/dL — ABNORMAL HIGH (ref 12.0–15.0)
POTASSIUM: 4.1 mmol/L (ref 3.5–5.1)
SODIUM: 139 mmol/L (ref 135–145)
TCO2: 28 mmol/L (ref 0–100)

## 2017-01-25 LAB — DIFFERENTIAL
BASOS PCT: 1 %
Basophils Absolute: 0.1 10*3/uL (ref 0.0–0.1)
EOS PCT: 1 %
Eosinophils Absolute: 0.1 10*3/uL (ref 0.0–0.7)
Lymphocytes Relative: 22 %
Lymphs Abs: 2.3 10*3/uL (ref 0.7–4.0)
MONO ABS: 0.7 10*3/uL (ref 0.1–1.0)
MONOS PCT: 7 %
NEUTROS ABS: 7 10*3/uL (ref 1.7–7.7)
Neutrophils Relative %: 69 %

## 2017-01-25 LAB — APTT: aPTT: 30 seconds (ref 24–36)

## 2017-01-25 LAB — CBG MONITORING, ED: Glucose-Capillary: 234 mg/dL — ABNORMAL HIGH (ref 65–99)

## 2017-01-25 LAB — PROTIME-INR
INR: 0.87
PROTHROMBIN TIME: 11.8 s (ref 11.4–15.2)

## 2017-01-25 MED ORDER — SIMVASTATIN 20 MG PO TABS
20.0000 mg | ORAL_TABLET | Freq: Every day | ORAL | Status: DC
  Administered 2017-01-25 – 2017-01-26 (×2): 20 mg via ORAL
  Filled 2017-01-25 (×2): qty 1

## 2017-01-25 MED ORDER — ONDANSETRON HCL 4 MG/2ML IJ SOLN
4.0000 mg | Freq: Four times a day (QID) | INTRAMUSCULAR | Status: DC | PRN
  Administered 2017-01-27: 4 mg via INTRAVENOUS
  Filled 2017-01-25: qty 2

## 2017-01-25 MED ORDER — CITALOPRAM HYDROBROMIDE 40 MG PO TABS
40.0000 mg | ORAL_TABLET | Freq: Every day | ORAL | Status: DC
  Administered 2017-01-25 – 2017-01-27 (×3): 40 mg via ORAL
  Filled 2017-01-25 (×3): qty 1

## 2017-01-25 MED ORDER — INSULIN ASPART PROT & ASPART (70-30 MIX) 100 UNIT/ML ~~LOC~~ SUSP
12.0000 [IU] | Freq: Every day | SUBCUTANEOUS | Status: DC
  Administered 2017-01-26: 12 [IU] via SUBCUTANEOUS
  Filled 2017-01-25: qty 10

## 2017-01-25 MED ORDER — ACETAMINOPHEN 650 MG RE SUPP: 650.0000 mg | Freq: Four times a day (QID) | RECTAL | Status: DC | PRN

## 2017-01-25 MED ORDER — HYDRALAZINE HCL 20 MG/ML IJ SOLN: 5.0000 mg | INTRAMUSCULAR | Status: DC | PRN

## 2017-01-25 MED ORDER — OMEGA-3-ACID ETHYL ESTERS 1 G PO CAPS
1.0000 g | ORAL_CAPSULE | Freq: Every day | ORAL | Status: DC
  Administered 2017-01-25 – 2017-01-27 (×3): 1 g via ORAL
  Filled 2017-01-25 (×3): qty 1

## 2017-01-25 MED ORDER — SODIUM CHLORIDE 0.9 % IV SOLN: 250.0000 mL | INTRAVENOUS | Status: DC | PRN

## 2017-01-25 MED ORDER — PANTOPRAZOLE SODIUM 40 MG PO TBEC
80.0000 mg | DELAYED_RELEASE_TABLET | Freq: Every day | ORAL | Status: DC
  Administered 2017-01-25: 40 mg via ORAL
  Administered 2017-01-26 – 2017-01-27 (×2): 80 mg via ORAL
  Filled 2017-01-25 (×3): qty 2

## 2017-01-25 MED ORDER — CLONAZEPAM 0.5 MG PO TABS
0.5000 mg | ORAL_TABLET | Freq: Two times a day (BID) | ORAL | Status: DC
  Administered 2017-01-25 – 2017-01-26 (×2): 0.5 mg via ORAL
  Filled 2017-01-25 (×4): qty 1

## 2017-01-25 MED ORDER — MAGNESIUM 500 MG PO CAPS: 1.0000 | ORAL_CAPSULE | Freq: Every day | ORAL | Status: DC

## 2017-01-25 MED ORDER — INSULIN ASPART 100 UNIT/ML ~~LOC~~ SOLN
0.0000 [IU] | Freq: Every day | SUBCUTANEOUS | Status: DC
  Administered 2017-01-25: 3 [IU] via SUBCUTANEOUS
  Administered 2017-01-26: 2 [IU] via SUBCUTANEOUS

## 2017-01-25 MED ORDER — ASPIRIN 300 MG RE SUPP: 300.0000 mg | Freq: Every day | RECTAL | Status: DC

## 2017-01-25 MED ORDER — INSULIN ASPART 100 UNIT/ML ~~LOC~~ SOLN
0.0000 [IU] | Freq: Three times a day (TID) | SUBCUTANEOUS | Status: DC
  Administered 2017-01-25: 3 [IU] via SUBCUTANEOUS
  Administered 2017-01-25: 2 [IU] via SUBCUTANEOUS
  Administered 2017-01-26 (×2): 3 [IU] via SUBCUTANEOUS
  Administered 2017-01-26: 5 [IU] via SUBCUTANEOUS
  Administered 2017-01-27: 2 [IU] via SUBCUTANEOUS
  Administered 2017-01-27: 3 [IU] via SUBCUTANEOUS

## 2017-01-25 MED ORDER — MAGNESIUM OXIDE 400 (241.3 MG) MG PO TABS
400.0000 mg | ORAL_TABLET | Freq: Every day | ORAL | Status: DC
  Administered 2017-01-26 – 2017-01-27 (×2): 400 mg via ORAL
  Filled 2017-01-25 (×3): qty 1

## 2017-01-25 MED ORDER — ASPIRIN 325 MG PO TABS
325.0000 mg | ORAL_TABLET | Freq: Every day | ORAL | Status: DC
  Administered 2017-01-25 – 2017-01-27 (×3): 325 mg via ORAL
  Filled 2017-01-25 (×4): qty 1

## 2017-01-25 MED ORDER — INSULIN ASPART PROT & ASPART (70-30 MIX) 100 UNIT/ML ~~LOC~~ SUSP
20.0000 [IU] | Freq: Every day | SUBCUTANEOUS | Status: DC
  Administered 2017-01-26: 20 [IU] via SUBCUTANEOUS

## 2017-01-25 MED ORDER — ACETAMINOPHEN 325 MG PO TABS: 650.0000 mg | ORAL_TABLET | Freq: Four times a day (QID) | ORAL | Status: DC | PRN

## 2017-01-25 MED ORDER — SODIUM CHLORIDE 0.9 % IV BOLUS (SEPSIS): 1000.0000 mL | Freq: Once | INTRAVENOUS | Status: DC

## 2017-01-25 MED ORDER — ONDANSETRON HCL 4 MG PO TABS: 4.0000 mg | ORAL_TABLET | Freq: Four times a day (QID) | ORAL | Status: DC | PRN

## 2017-01-25 MED ORDER — SODIUM CHLORIDE 0.9% FLUSH: 3.0000 mL | INTRAVENOUS | Status: DC | PRN

## 2017-01-25 MED ORDER — STROKE: EARLY STAGES OF RECOVERY BOOK
Freq: Once | Status: AC
  Administered 2017-01-25: 14:00:00
  Filled 2017-01-25: qty 1

## 2017-01-25 MED ORDER — SODIUM CHLORIDE 0.9% FLUSH
3.0000 mL | Freq: Two times a day (BID) | INTRAVENOUS | Status: DC
  Administered 2017-01-25 – 2017-01-27 (×4): 3 mL via INTRAVENOUS

## 2017-01-25 NOTE — H&P (Signed)
History and Physical    [name] DZH:[id number] DOB: 10/18/1945 DOA: 01/25/2017  PCP: [name], MD Patient coming from: Home  Chief Complaint: R sided weakness  HPI: [name] is a 71 y.o. female with medical history significant of brain aneurysm, diabetes, GERD, hiatal hernia, hyperlipidemia, hypertension, migraines, PVD, TIA presenting with right-sided weakness and decreased sensation. Patient's last known normal at 23:00 on 01/24/2017. Patient reports awaking in on the morning of admission with right-sided extremity weakness and decreased sensation as well as right facial droop. Symptoms are constant and unchanged since onset. Deficits are significant enough that patient has had a marked difficulty with ambulation and states that her right leg keeps giving out on her. Right arm is described as feeling heavy. Previous TIAs with facial involvement only. Patient endorses compliance with daily aspirin. Denies any recent dizziness/vertigo, headache, neck stiffness, chest pain, shortness breath, palpitations, fevers, dumping, dysuria, frequency, confusion.   ED Course: Stroke protocol initiated. Neuro consulted  Review of Systems: As per HPI otherwise all other systems reviewed and are negative  Ambulatory Status: difficulty ambulating due to stroke like symptoms  Past Medical History:  Diagnosis Date  . Abnormal findings on esophagogastroduodenoscopy (EGD) 07/2010  . Aneurysm [facility]) 2013   Right Brain   . Aneurysm ([place])    in brain x 2  . Anxiety   . Colon polyps   . Diabetes mellitus 1998  . Diverticulosis   . GERD (gastroesophageal reflux disease)   . Hemorrhoids   . Hiatal hernia   . Hyperlipidemia   . Hypertension 2000  . Migraines   . Peripheral vascular disease ([place])   . Phlebitis    30 years ago  left leg  . Status post colonoscopy 07/2010  . Stroke [facility])    multiple mini strokes ( brain aneurysm )    Past Surgical History:  Procedure Laterality Date    . ABDOMINAL AORTAGRAM N/A 01/04/2012   Procedure: ABDOMINAL Maxcine Ham;  Surgeon: Serafina Mitchell, MD;  Location: [facility] CATH LAB;  Service: Cardiovascular;  Laterality: N/A;  . ABDOMINAL AORTAGRAM N/A 08/15/2012   Procedure: ABDOMINAL Maxcine Ham;  Surgeon: Serafina Mitchell, MD;  Location: [facility] CATH LAB;  Service: Cardiovascular;  Laterality: N/A;  . ABDOMINAL HYSTERECTOMY  1984  . cataract surgery Right 10-22-2015  . cataract surgery Left 11-05-2015  . CESAREAN SECTION    . CHOLECYSTECTOMY    . COLONOSCOPY  07/2010  . DENTAL SURGERY  Aug. 16, 2013   left lower   . ESOPHAGOGASTRODUODENOSCOPY  07/2010  . EYE SURGERY  Nov. 2014   Laser-Glaucoma  . FEMORAL ARTERY STENT  05/11/11   Left superficial femoral and popliteal artery  . FEMORAL-POPLITEAL BYPASS GRAFT  Nov. 26, 2013   Left Angiogram-   . HERNIA REPAIR     times two  . KNEE SURGERY    . LOWER EXTREMITY ANGIOGRAM Left 08/15/2012   Procedure: LOWER EXTREMITY ANGIOGRAM;  Surgeon: Serafina Mitchell, MD;  Location: [facility] CATH LAB;  Service: Cardiovascular;  Laterality: Left;  lt leg angio  . rotator cuff surgery    . THYROID SURGERY      Social History   Social History  . Marital status: Married    Spouse name: N/A  . Number of children: 2  . Years of education: N/A   Occupational History  . 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 Smoking status: Current Every Day Smoker    Packs/day: 0.50  Years: 40.00    Types: Cigarettes  . Smokeless tobacco: Never Used  . Alcohol use No  . Drug use: No  . Sexual activity: Not on file   Other Topics Concern  . Not on file   Social History Narrative   Lives with husband.            Allergies  Allergen Reactions  . Plavix [Clopidogrel Bisulfate] Palpitations  . Amoxicillin Itching and Swelling    FACE & EYES SWELL  . Ace Inhibitors   . Lyrica [Pregabalin]   . Penicillins Other (See Comments)    REACTION: unspecified    Family History  Problem  Relation Age of Onset  . CAD Mother 6    Died of MI  . Hypertension Mother   . Heart attack Mother   . Heart disease Mother   . CAD Father 32    Died of MI  . Heart disease Father   . Heart attack Father   . CAD Brother 94    Two brothers died of MI  . Heart attack Brother   . Heart disease Brother     Amputation  . Diabetes Sister   . Hypertension Sister   . Heart attack Brother   . Heart disease Brother     Prior to Admission medications   Medication Sig Start Date End Date Taking? Authorizing Provider  amLODipine (NORVASC) 10 MG tablet Take 1 tablet (10 mg total) by mouth daily. 08/09/16  Yes Forcucci, Courtney, PA-C  aspirin 325 MG tablet Take 325 mg by mouth daily.    Yes [provider]  b complex vitamins capsule Take 1 capsule by mouth daily.   Yes [provider]  Cholecalciferol (VITAMIN D3) 5000 UNITS TABS Take 1 capsule by mouth daily.   Yes [provider]  CINNAMON PO Take 1,000 mg by mouth 2 (two) times daily.    Yes [provider]  citalopram (CELEXA) 40 MG tablet TAKE 1 TABLET DAILY FOR MOOD & ANXIETY 01/17/17  Yes [name], MD  clonazePAM (KLONOPIN) 0.5 MG tablet TAKE 1 TABLET BY MOUTH 2 TIMES DAILY 09/27/16  Yes [name], MD  fluticasone [facility]) 50 MCG/ACT nasal spray Place 2 sprays into both nostrils daily. 08/09/16  Yes Forcucci, Courtney, PA-C  glucose blood (ACCU-CHEK AVIVA PLUS) test strip Use daily to check BS TID Dx. E11.22 08/05/15  Yes Forcucci, Courtney, PA-C  insulin NPH-regular Human (NOVOLIN 70/30) (70-30) 100 UNIT/ML injection Inject 25 units in the AM [place] with food, and inject 25 units [place] with evening meal. Patient taking differently: Inject 12-20 Units into the skin 2 (two) times daily with a meal. Inject 12 units in the AM [place] with food, and inject 20 units [place] with evening meal. 02/26/16  Yes [name], PA-C  Insulin Syringe-Needle U-100 31G X 15/64" 0.5 ML MISC Use two pens daily with insulin  02/23/16  Yes [name], PA-C  IRON PO Take by mouth daily.   Yes [provider]  losartan (COZAAR) 100 MG tablet Take 1 tablet (100 mg total) by mouth daily. 08/09/16 08/09/17 Yes Forcucci, Courtney, PA-C  Magnesium 500 MG CAPS Take 1 capsule by mouth daily.   Yes [provider]  metFORMIN (GLUCOPHAGE-XR) 500 MG 24 hr tablet Take 2 tablets (1,000 mg total) by mouth 2 (two) times daily. 08/09/16  Yes Forcucci, Courtney, PA-C  Omega-3 Fatty Acids (FISH OIL) 1200 MG CAPS Take 1 capsule by mouth daily.   Yes [provider]  omeprazole (PRILOSEC) 40 MG capsule  05/13/16  Yes [provider]  simvastatin (ZOCOR) 20 MG tablet TAKE 1 TABLET (20 MG TOTAL) BY MOUTH AT BEDTIME. 08/09/16  Yes Forcucci, Courtney, PA-C  vitamin E (VITAMIN E) 400 UNIT capsule Take 400 Units by mouth daily.   Yes [provider]  Blood Glucose Monitoring Suppl (ACCU-CHEK AVIVA PLUS) W/DEVICE KIT Check blood sugar up to three times daily 03/26/15   [name], PA-C    Physical Exam: Vitals:   01/25/17 0805 01/25/17 0930 01/25/17 1015  BP: (!) 169/73 (!) 151/74 (!) 168/67  Pulse: 79 64 66  Resp: 16 18 17   Temp: 98.6 F (37 C)    TempSrc: Oral    SpO2: 96% 96% 96%  Weight: 79.8 kg (176 lb)    Height: 5' 6"  (1.676 m)       General:  Appears calm and comfortable Eyes:  PERRL, EOMI, normal lids, iris ENT:  grossly normal hearing, lips & tongue, mmm Neck:  no LAD, masses or thyromegaly Cardiovascular:  RRR, no m/r/g. No LE edema.  Respiratory:  CTA bilaterally, no w/r/r. Normal respiratory effort. Abdomen:  soft, ntnd, NABS Skin:  no rash or induration seen on limited exam Musculoskeletal: good ROM, no bony abnormality Psychiatric:  grossly normal mood and affect, speech fluent and appropriate, AOx3 Neurologic: Right perioral numbness with decreased sensation on palpation. Right fingertips and right toes also numb on palpation. Right grip strength and flexion 3 out  of 5 with left grip strength and flexion 5 out of 5. Right lower extremity hip flexion 3 out of 5, left hip flexion 5 out of 5. Labs on Admission: I have personally reviewed following labs and imaging studies  CBC:  Recent Labs Lab 01/25/17 0816 01/25/17 0836  WBC 10.1  --   NEUTROABS 7.0  --   HGB 14.6 15.3*  HCT 43.9 45.0  MCV 94.2  --   PLT 313  --    Basic Metabolic Panel:  Recent Labs Lab 01/25/17 0816 01/25/17 0836  NA 137 139  K 3.9 4.1  CL 104 103  CO2 24  --   GLUCOSE 234* 234*  BUN 19 24*  CREATININE 1.08* 1.00  CALCIUM 9.0  --    GFR: Estimated Creatinine Clearance: 55.8 mL/min (by C-G formula based on SCr of 1 mg/dL). Liver Function Tests:  Recent Labs Lab 01/25/17 0816  AST 20  ALT 13*  ALKPHOS 101  BILITOT 0.8  PROT 6.7  ALBUMIN 3.2*   No results for input(s): LIPASE, AMYLASE in the last 168 hours. No results for input(s): AMMONIA in the last 168 hours. Coagulation Profile:  Recent Labs Lab 01/25/17 0816  INR 0.87   Cardiac Enzymes: No results for input(s): CKTOTAL, CKMB, CKMBINDEX, TROPONINI in the last 168 hours. BNP (last 3 results) No results for input(s): PROBNP in the last 8760 hours. HbA1C: No results for input(s): HGBA1C in the last 72 hours. CBG:  Recent Labs Lab 01/25/17 0924  GLUCAP 234*   Lipid Profile: No results for input(s): CHOL, HDL, LDLCALC, TRIG, CHOLHDL, LDLDIRECT in the last 72 hours. Thyroid Function Tests: No results for input(s): TSH, T4TOTAL, FREET4, T3FREE, THYROIDAB in the last 72 hours. Anemia Panel: No results for input(s): VITAMINB12, FOLATE, FERRITIN, TIBC, IRON, RETICCTPCT in the last 72 hours. Urine analysis:    Component Value Date/Time   COLORURINE YELLOW 07/22/2016 [place] 07/22/2016 1629   LABSPEC 1.031 07/22/2016 1629   PHURINE 5.5 07/22/2016 1629  GLUCOSEU 3+ (A) 07/22/2016 1629   GLUCOSEU 500 (?) 01/30/2010 1056   HGBUR NEGATIVE 07/22/2016 1629   BILIRUBINUR  NEGATIVE 07/22/2016 1629   KETONESUR NEGATIVE 07/22/2016 1629   PROTEINUR 2+ (A) 07/22/2016 1629   UROBILINOGEN 0.2 10/04/2014 1807   NITRITE NEGATIVE 07/22/2016 1629   LEUKOCYTESUR NEGATIVE 07/22/2016 1629    Creatinine Clearance: Estimated Creatinine Clearance: 55.8 mL/min (by C-G formula based on SCr of 1 mg/dL).  Sepsis Labs: @LABRCNTIP (procalcitonin:4,lacticidven:4) )No results found for this or any previous visit (from the past 240 hour(s)).   Radiological Exams on Admission: Ct Head Wo Contrast  Result Date: 01/25/2017 CLINICAL DATA:  Right side weakness. EXAM: CT HEAD WITHOUT CONTRAST TECHNIQUE: Contiguous axial images were obtained from the base of the skull through the vertex without intravenous contrast. COMPARISON:  MRI 07/19/2016 FINDINGS: Brain: No acute intracranial abnormality. Specifically, no hemorrhage, hydrocephalus, mass lesion, acute infarction, or significant intracranial injury. Vascular: No hyperdense vessel or unexpected calcification. Skull: No acute calvarial abnormality. Sinuses/Orbits: Visualized paranasal sinuses and mastoids clear. Orbital soft tissues unremarkable. Other: None IMPRESSION: No acute intracranial abnormality. Electronically Signed   By: Rolm Baptise M.D.   On: 01/25/2017 08:45    EKG: Independently reviewed. NSR, NO ACS     Assessment/Plan Active Problems:   Essential hypertension   CEREBRAL ANEURYSM   Ischemic stroke (HCC)   Diabetes mellitus with complication (HCC)   Depression with anxiety   Stroke: suspect ischemic stroke base on description of sx and h/o TIA. CT negative for hemorrhage (pt w/ RICA 32m aneurysm). R perioral/finger/toe numbness w/ RUE/RLE weakness at presentation. - MRI/MRA,Carotid dopplers, Echo - PT/OT - A1c, lipids - formal Neuro eval pending - permissive HTN 200/100 - ASA, Statin  HTN: allow permissive HTN as above (slight decrease in typical range due to aneurysm) - hold Norvasc, losartan - Hydralazine  prn  DM: - continue 70/30 - SSI  Depression/Anxiety: - continue Klonopin and celexa   DVT prophylaxis: SCD  Code Status: FULL  Family Communication: husband  Disposition Plan: pending workup and PT/OT eval  Consults called: Neuro  Admission status: Inpatient    Dalal Livengood J MD [facility]  If 7PM-7AM, please contact night-coverage [email or web address] Password [email or web address]  01/25/2017, 10:29 AM

## 2017-01-25 NOTE — Progress Notes (Addendum)
Occupational Therapy Evaluation Patient Details Name: MINDIE RAWDON MRN: [id number] DOB: 1946/02/26 Today's Date: 01/25/2017    History of Present Illness Pt is a 71 y/o female who presents with R sided weakness and numbness.  MRI acute infarct left lateral thalamus and internal capsule.    Clinical Impression   PTA, pt independent with ADL and mobility and was very active in the community. Works at the [employer], drives and is involved in her [place] [place]. Pt presents with significant functional change in status and requires mod A with functional mobility and ADL. Feel pt is an excellent CIR candidate to facilitate return to Mod I level. Pt states she told the PT "no", but has "reconsidered it and will do what she needs to do in order to get home". Will follow acutely to address established goals and maximize functional level of independence.     Follow Up Recommendations  CIR    Equipment Recommendations  3 in 1 bedside commode    Recommendations for Other Services Rehab consult     Precautions / Restrictions Precautions Precautions: Fall Restrictions Weight Bearing Restrictions: No      Mobility Bed Mobility               General bed mobility comments: OOB in chair  Transfers Overall transfer level: Needs assistance Equipment used:  Transfers: Sit to/from Stand Sit to Stand: Min assist         General transfer comment: LOB initially when stnaidng    Balance Overall balance assessment: Needs assistance Sitting-balance support: Feet supported;No upper extremity supported Sitting balance-Leahy Scale: Fair     Standing balance support: Bilateral upper extremity supported;During functional activity Standing balance-Leahy Scale: Poor Standing balance comment: Requires RW for support at this time.                            ADL either performed or assessed with clinical judgement   ADL Overall ADL's : Needs assistance/impaired Eating/Feeding: Set  up Eating/Feeding Details (indicate cue type and reason): unable to feed self with R dominant hand Grooming: Minimal assistance   Upper Body Bathing: Minimal assistance;Sitting   Lower Body Bathing: Minimal assistance;Sit to/from stand   Upper Body Dressing : Minimal assistance;Sitting   Lower Body Dressing: Moderate assistance;Sit to/from stand   Toilet Transfer: Moderate assistance;Ambulation   Toileting- Clothing Manipulation and Hygiene: Moderate assistance;Sit to/from stand       Functional mobility during ADLs: Moderate assistance General ADL Comments: LOB to R during ambulation for ADL. Pt has difficulty using R dominant UE functionally. Extremely high fall risk. Unable to release support and complete dynamic activity. Mod A to prevent fall to R. Unaware spacing of feet when standing due to sensory deficits.      Vision Baseline Vision/History: Wears glasses;Cataracts Vision Assessment?: Vision impaired- to be further tested in functional context Additional Comments: hx of visual impairment; will further assess     Perception Perception Comments: appears intact   Praxis      Pertinent Vitals/Pain Pain Assessment: No/denies pain     Hand Dominance Right - significant sensorimotor deficits   Extremity/Trunk Assessment Upper Extremity Assessment Upper Extremity Assessment: RUE deficits/detail RUE Deficits / Details: Pt with isolated movement RUE. Brunstrom stage VI. "clumsy hand". Decreased light touch throughout RUE affectign functional use of RUE RUE Sensation: decreased proprioception;decreased light touch (medial upper arm on R) RUE Coordination: decreased fine motor;decreased gross motor (Decreased finger to nose )  Lower Extremity Assessment Lower Extremity Assessment: Defer to PT evaluation RLE Deficits / Details: 4-/5 hip flexor, 3+/5 quads, 4-/5 hams, 4-/5 DF/PF RLE Sensation: decreased light touch;history of peripheral neuropathy;decreased  proprioception RLE Coordination: decreased fine motor;decreased gross motor   Cervical / Trunk Assessment Cervical / Trunk Assessment: Normal   Communication Communication Communication: No difficulties   Cognition Arousal/Alertness: Awake/alert Behavior During Therapy: WFL for tasks assessed/performed (Tearful at times) Overall Cognitive Status: Within Functional Limits for tasks assessed (will further assess attention)                                 General Comments: Appears intact. Will further assess   General Comments       Exercises Exercises: Other exercises Other Exercises Other Exercises: fine/gross coordnation activities   Shoulder Instructions      Home Living Family/patient expects to be discharged to:: Private residence Living Arrangements: Spouse/significant other Available Help at Discharge: Family;Available 24 hours/day Type of Home: House Home Access: Stairs to enter [employer] of Steps: 4 Entrance Stairs-Rails: Right Home Layout: One level     Bathroom Shower/Tub: Teacher, early years/pre: Standard Bathroom Accessibility: Yes How Accessible: Accessible via walker Home Equipment: None          Prior Functioning/Environment Level of Independence: Independent        Comments: works as Audiological scientist; drives        OT Problem List: Decreased strength;Decreased activity tolerance;Impaired balance (sitting and/or standing);Decreased coordination;Decreased safety awareness;Decreased knowledge of use of DME or AE;Impaired sensation;Impaired UE functional use      OT Treatment/Interventions: Self-care/ADL training;Therapeutic exercise;Neuromuscular education;DME and/or AE instruction;Therapeutic activities;Patient/family education;Balance training;Visual/perceptual remediation/compensation    OT Goals(Current goals can be found in the care plan section) Acute Rehab OT Goals Patient Stated Goal:  to get better OT Goal Formulation: With patient Time For Goal Achievement: 02/08/17 Potential to Achieve Goals: Good ADL Goals Pt Will Perform Lower Body Bathing: with modified independence;with caregiver independent in assisting;sit to/from stand Pt Will Perform Lower Body Dressing: with modified independence;sit to/from stand;with caregiver independent in assisting Pt Will Transfer to Toilet: with modified independence;ambulating;bedside commode Pt Will Perform Toileting - Clothing Manipulation and hygiene: with modified independence;sit to/from stand Pt/caregiver will Perform Home Exercise Program: Right Upper extremity;With written HEP provided;Increased strength (fine motor/coordination HEP)  OT Frequency: Min 2X/week   Barriers to D/C:            Co-evaluation              AM-PAC PT "6 Clicks" Daily Activity     Outcome Measure Help from another person eating meals?: A Little Help from another person taking care of personal grooming?: A Little Help from another person toileting, which includes using toliet, bedpan, or urinal?: A Little Help from another person bathing (including washing, rinsing, drying)?: A Little Help from another person to put on and taking off regular upper body clothing?: A Little Help from another person to put on and taking off regular lower body clothing?: A Little 6 Click Score: 18   End of Session Equipment Utilized During Treatment: Gait belt;Rolling walker Nurse Communication: Mobility status  Activity Tolerance: Patient tolerated treatment well Patient left: in chair;with call bell/phone within reach;with chair alarm set;with family/visitor present  OT Visit Diagnosis: Unsteadiness on feet (R26.81);Other abnormalities of gait and mobility (R26.89);Ataxia, unspecified (R27.0)  Time: 3291-9166 OT Time Calculation (min): 23 min Charges:  OT General Charges $OT Visit: 1 Procedure OT Evaluation $OT Eval Moderate Complexity: 1  Procedure OT Treatments $Self Care/Home Management : 8-22 mins G-Codes:     [facility], OT/L  [phone] 01/25/2017  Kinzey Sheriff,HILLARY 01/25/2017, 4:46 PM

## 2017-01-25 NOTE — Consult Note (Signed)
Requesting Physician: Dr. Marily Memos    Chief Complaint: stroke  History obtained from:  Patient    HPI:                                                                                                                                         [name] is an 71 y.o. female with medical history significant of brain aneurysm, diabetes, GERD, hiatal hernia, hyperlipidemia, hypertension, migraines, PVD, TIA presenting with right-sided weakness and decreased sensation. Patient's last known normal at 23:00 on 01/24/2017. Patient reports awaking in on the morning of admission with right-sided extremity weakness and decreased sensation as well as right facial droop.    Currently her main complaint is decreased sensation in her right hand and a sensation of feeling of clumsiness with her right hand. She also feels that her right leg feels weak and decreased sensation.  Date last known well: Date: 01/24/2017 Time last known well: Time: 23:00 tPA Given: No: out of window   Past Medical History:  Diagnosis Date  . Abnormal findings on esophagogastroduodenoscopy (EGD) 07/2010  . Aneurysm [facility]) 2013   Right Brain   . Aneurysm ([place])    in brain x 2  . Anxiety   . Colon polyps   . Diabetes mellitus 1998  . Diverticulosis   . GERD (gastroesophageal reflux disease)   . Hemorrhoids   . Hiatal hernia   . Hyperlipidemia   . Hypertension 2000  . Migraines   . Peripheral vascular disease ([place])   . Phlebitis    30 years ago  left leg  . Status post colonoscopy 07/2010  . Stroke [facility])    multiple mini strokes ( brain aneurysm )    Past Surgical History:  Procedure Laterality Date  . ABDOMINAL AORTAGRAM N/A 01/04/2012   Procedure: ABDOMINAL Maxcine Ham;  Surgeon: Serafina Mitchell, MD;  Location: [facility] CATH LAB;  Service: Cardiovascular;  Laterality: N/A;  . ABDOMINAL AORTAGRAM N/A 08/15/2012   Procedure: ABDOMINAL Maxcine Ham;  Surgeon: Serafina Mitchell, MD;  Location: [facility] CATH LAB;  Service:  Cardiovascular;  Laterality: N/A;  . ABDOMINAL HYSTERECTOMY  1984  . cataract surgery Right 10-22-2015  . cataract surgery Left 11-05-2015  . CESAREAN SECTION    . CHOLECYSTECTOMY    . COLONOSCOPY  07/2010  . DENTAL SURGERY  Aug. 16, 2013   left lower   . ESOPHAGOGASTRODUODENOSCOPY  07/2010  . EYE SURGERY  Nov. 2014   Laser-Glaucoma  . FEMORAL ARTERY STENT  05/11/11   Left superficial femoral and popliteal artery  . FEMORAL-POPLITEAL BYPASS GRAFT  Nov. 26, 2013   Left Angiogram-   . HERNIA REPAIR     times two  . KNEE SURGERY    . LOWER EXTREMITY ANGIOGRAM Left 08/15/2012   Procedure: LOWER EXTREMITY ANGIOGRAM;  Surgeon: Serafina Mitchell, MD;  Location: [facility] CATH LAB;  Service: Cardiovascular;  Laterality: Left;  lt leg angio  . rotator cuff surgery    . THYROID SURGERY      Family History  Problem Relation Age of Onset  . CAD Mother 60    Died of MI  . Hypertension Mother   . Heart attack Mother   . Heart disease Mother   . CAD Father 33    Died of MI  . Heart disease Father   . Heart attack Father   . CAD Brother 58    Two brothers died of MI  . Heart attack Brother   . Heart disease Brother     Amputation  . Diabetes Sister   . Hypertension Sister   . Heart attack Brother   . Heart disease Brother    Social History:  reports that she has been smoking Cigarettes.  She has a 20.00 pack-year smoking history. She has never used smokeless tobacco. She reports that she does not drink alcohol or use drugs.  Allergies:  Allergies  Allergen Reactions  . Plavix [Clopidogrel Bisulfate] Palpitations  . Amoxicillin Itching and Swelling    FACE & EYES SWELL  . Ace Inhibitors   . Lyrica [Pregabalin]   . Penicillins Other (See Comments)    REACTION: unspecified    Medications:                                                                                                                           Scheduled: .  stroke: mapping our early stages of recovery book   Does not  apply Once  . aspirin  300 mg Rectal Daily   Or  . aspirin  325 mg Oral Daily  . citalopram  40 mg Oral Daily  . clonazePAM  0.5 mg Oral BID  . insulin aspart  0-5 Units Subcutaneous QHS  . insulin aspart  0-9 Units Subcutaneous TID WC  . [START ON 01/26/2017] insulin aspart protamine- aspart  12 Units Subcutaneous QAC breakfast  . insulin aspart protamine- aspart  20 Units Subcutaneous Q supper  . magnesium oxide  400 mg Oral Daily  . omega-3 acid ethyl esters  1 g Oral Daily  . pantoprazole  80 mg Oral Daily  . simvastatin  20 mg Oral q1800  . sodium chloride flush  3 mL Intravenous Q12H    ROS:  History obtained from the patient  General ROS: negative for - chills, fatigue, fever, night sweats, weight gain or weight loss Psychological ROS: negative for - behavioral disorder, hallucinations, memory difficulties, mood swings or suicidal ideation Ophthalmic ROS: negative for - blurry vision, double vision, eye pain or loss of vision ENT ROS: negative for - epistaxis, nasal discharge, oral lesions, sore throat, tinnitus or vertigo Allergy and Immunology ROS: negative for - hives or itchy/watery eyes Hematological and Lymphatic ROS: negative for - bleeding problems, bruising or swollen lymph nodes Endocrine ROS: negative for - galactorrhea, hair pattern changes, polydipsia/polyuria or temperature intolerance Respiratory ROS: negative for - cough, hemoptysis, shortness of breath or wheezing Cardiovascular ROS: negative for - chest pain, dyspnea on exertion, edema or irregular heartbeat Gastrointestinal ROS: negative for - abdominal pain, diarrhea, hematemesis, nausea/vomiting or stool incontinence Genito-Urinary ROS: negative for - dysuria, hematuria, incontinence or urinary frequency/urgency Musculoskeletal ROS: negative for - joint swelling or muscular  weakness Neurological ROS: as noted in HPI Dermatological ROS: negative for rash and skin lesion changes  Neurologic Examination:                                                                                                      Blood pressure (!) 194/60, pulse 71, temperature 97.9 F (36.6 C), temperature source Oral, resp. rate 18, height 5\' 6"  (1.676 m), weight 79.8 kg (176 lb), SpO2 93 %.  HEENT-  Normocephalic, no lesions, without obvious abnormality.  Normal external eye and conjunctiva.  Normal TM's bilaterally.  Normal auditory canals and external ears. Normal external nose, mucus membranes and septum.  Normal pharynx. Cardiovascular- S1, S2 normal, pulses palpable throughout   Lungs- chest clear, no wheezing, rales, normal symmetric air entry Abdomen- normal findings: bowel sounds normal Extremities- no edema Lymph-no adenopathy palpable Musculoskeletal-no joint tenderness, deformity or swelling Skin-warm and dry, no hyperpigmentation, vitiligo, or suspicious lesions  Neurological Examination Mental Status: Alert, oriented, thought content appropriate.  Speech fluent without evidence of aphasia.  Able to follow 3 step commands without difficulty. Cranial Nerves: II:  Visual fields grossly normal,  III,IV, VI: ptosis not present, extra-ocular motions intact bilaterally, pupils equal, round, reactive to light and accommodation V,VII: smile shows a very mild asymmetry when smiling on the right, facial light touch sensation decreased on the right splitting midline from forehead to chin with both light touch, pinprick and tuning fork VIII: hearing normal bilaterally IX,X: uvula rises symmetrically XI: bilateral shoulder shrug XII: midline tongue extension Motor: Right : Upper extremity   5/5    Left:     Upper extremity   5/5  Lower extremity   5/5     Lower extremity   5/5 --A should be noted that patient had inconsistencies with muscle strength at times not giving full effort  and showing give way strength other times when distracted giving full strength. Tone and bulk:normal tone throughout; no atrophy noted Sensory: States she has decreased sensation on the right side including arm chest, abdomen, leg to both light touch, pinprick, tuning fork Deep Tendon Reflexes: 2+ and brisk  and symmetric throughout Plantars: Right: downgoing   Left: downgoing Cerebellar: normal finger-to-nose, and normal heel-to-shin test Gait: Not tested       Lab Results: Basic Metabolic Panel:  Recent Labs Lab 01/25/17 0816 01/25/17 0836  NA 137 139  K 3.9 4.1  CL 104 103  CO2 24  --   GLUCOSE 234* 234*  BUN 19 24*  CREATININE 1.08* 1.00  CALCIUM 9.0  --     Liver Function Tests:  Recent Labs Lab 01/25/17 0816  AST 20  ALT 13*  ALKPHOS 101  BILITOT 0.8  PROT 6.7  ALBUMIN 3.2*   No results for input(s): LIPASE, AMYLASE in the last 168 hours. No results for input(s): AMMONIA in the last 168 hours.  CBC:  Recent Labs Lab 01/25/17 0816 01/25/17 0836  WBC 10.1  --   NEUTROABS 7.0  --   HGB 14.6 15.3*  HCT 43.9 45.0  MCV 94.2  --   PLT 313  --     Cardiac Enzymes: No results for input(s): CKTOTAL, CKMB, CKMBINDEX, TROPONINI in the last 168 hours.  Lipid Panel: No results for input(s): CHOL, TRIG, HDL, CHOLHDL, VLDL, LDLCALC in the last 168 hours.  CBG:  Recent Labs Lab 01/25/17 0924 01/25/17 1211  GLUCAP 234* 211*    Microbiology: No results found for this or any previous visit.  Coagulation Studies:  Recent Labs  01/25/17 0816  LABPROT 11.8  INR 0.87    Imaging: Ct Head Wo Contrast  Result Date: 01/25/2017 CLINICAL DATA:  Right side weakness. EXAM: CT HEAD WITHOUT CONTRAST TECHNIQUE: Contiguous axial images were obtained from the base of the skull through the vertex without intravenous contrast. COMPARISON:  MRI 07/19/2016 FINDINGS: Brain: No acute intracranial abnormality. Specifically, no hemorrhage, hydrocephalus, mass lesion,  acute infarction, or significant intracranial injury. Vascular: No hyperdense vessel or unexpected calcification. Skull: No acute calvarial abnormality. Sinuses/Orbits: Visualized paranasal sinuses and mastoids clear. Orbital soft tissues unremarkable. Other: None IMPRESSION: No acute intracranial abnormality. Electronically Signed   By: Rolm Baptise M.D.   On: 01/25/2017 08:45       Assessment and plan discussed with with attending physician and they are in agreement.    Etta Quill PA-C [facility] [phone]  01/25/2017, 12:42 PM   Assessment: 71 y.o. female with new onset of right-sided weakness and decreased sensation. Exam shows inconsistencies with muscle strength and sensation. DWI shows left thalamic CVA Again however given her stroke risk factors would obtain MRI of brain to evaluate for possible stroke as her symptoms include both face arm and leg.  Stroke Risk Factors - diabetes mellitus, hyperlipidemia and hypertension  Recommend:  1. HgbA1c, fasting lipid panel 2. MRA  of the brain without contrast 3. PT consult, OT consult, Speech consult 4. Echocardiogram 5. Carotid dopplers 6. Prophylactic therapy-Antiplatelet med: Aspirin - dose 325 mg daily 7. Risk factor modification 8. Telemetry monitoring 9. Frequent neuro checks 10 NPO until passes stroke swallow screen 11 please page stroke NP  Or  PA  Or MD from 8am -4 pm  as this patient from this time will be  followed by the stroke.   You can look them up on [email or web address]  Password [email or web address]

## 2017-01-25 NOTE — ED Provider Notes (Signed)
[place] DEPT Provider Note   CSN: [id number] Arrival date & time: 01/25/17  0800     History   Chief Complaint Chief Complaint  Patient presents with  . Cerebrovascular Accident    HPI [name] is a 71 y.o. female.  The history is provided by the patient and medical records.  Cerebrovascular Accident     71 y.o. F with hx of Brain aneurysm 2, anxiety, diabetes, GERD, hyperlipidemia, hypertension, peripheral vascular disease, migraine headaches, history of TIA,  presenting to the ED for right-sided weakness. Patient last seen normal 11 PM last evening. States upon waking this morning she felt that the right side of her mouth and face as well as right arm and leg were known she had some weakness of her right arm and leg.  States she has had difficulty walking this morning-- states her right leg feels weak and when trying to walk her right leg kept giving out on her.  States her right arm and leg feel heavy.  States when her prior mini strokes she had facial involvement, never her extremities.  States she does take daily aspirin, however is allergic to Plavix.  Neurologist is Dr. Catalina Gravel.  Past Medical History:  Diagnosis Date  . Abnormal findings on esophagogastroduodenoscopy (EGD) 07/2010  . Aneurysm [facility]) 2013   Right Brain   . Aneurysm ([place])    in brain x 2  . Anxiety   . Colon polyps   . Diabetes mellitus 1998  . Diverticulosis   . GERD (gastroesophageal reflux disease)   . Hemorrhoids   . Hiatal hernia   . Hyperlipidemia   . Hypertension 2000  . Migraines   . Peripheral vascular disease ([place])   . Phlebitis    30 years ago  left leg  . Status post colonoscopy 07/2010  . Stroke [facility])    multiple mini strokes ( brain aneurysm )    Patient Active Problem List   Diagnosis Date Noted  . Hyperlipidemia 11/07/2015  . Insulin-requiring or dependent type II diabetes mellitus ([place]) 11/07/2015  . Encounter for Medicare annual wellness exam 06/24/2015  .  Generalized anxiety disorder 03/26/2015  . Depression, controlled 03/26/2015  . Cerebral infarction ([place]) 10/04/2014  . Tobacco abuse 10/04/2014  . Vitamin D deficiency 11/02/2013  . Medication management 11/02/2013  . Peripheral vascular disease due to secondary diabetes mellitus ([place]) 05/08/2012  . Atherosclerosis of native arteries of extremity with intermittent claudication 12/24/2011  . Diverticulosis of large intestine 06/09/2010  . History of colonic polyps 06/09/2010  . ESOPHAGEAL STRICTURE 08/27/2008  . Esophageal reflux 08/27/2008  . Nontoxic multinodular goiter 03/11/2008  . Glaucoma 03/11/2008  . CEREBRAL ANEURYSM 03/11/2008  . CKD stage 3 due to type 2 diabetes mellitus ([place]) 05/01/2007  . Essential hypertension 05/01/2007    Past Surgical History:  Procedure Laterality Date  . ABDOMINAL AORTAGRAM N/A 01/04/2012   Procedure: ABDOMINAL Maxcine Ham;  Surgeon: Serafina Mitchell, MD;  Location: [facility] CATH LAB;  Service: Cardiovascular;  Laterality: N/A;  . ABDOMINAL AORTAGRAM N/A 08/15/2012   Procedure: ABDOMINAL Maxcine Ham;  Surgeon: Serafina Mitchell, MD;  Location: [facility] CATH LAB;  Service: Cardiovascular;  Laterality: N/A;  . ABDOMINAL HYSTERECTOMY  1984  . cataract surgery Right 10-22-2015  . cataract surgery Left 11-05-2015  . CESAREAN SECTION    . CHOLECYSTECTOMY    . COLONOSCOPY  07/2010  . DENTAL SURGERY  Aug. 16, 2013   left lower   . ESOPHAGOGASTRODUODENOSCOPY  07/2010  . EYE SURGERY  Nov. 2014   Laser-Glaucoma  . FEMORAL ARTERY STENT  05/11/11   Left superficial femoral and popliteal artery  . FEMORAL-POPLITEAL BYPASS GRAFT  Nov. 26, 2013   Left Angiogram-   . HERNIA REPAIR     times two  . KNEE SURGERY    . LOWER EXTREMITY ANGIOGRAM Left 08/15/2012   Procedure: LOWER EXTREMITY ANGIOGRAM;  Surgeon: Serafina Mitchell, MD;  Location: [facility] CATH LAB;  Service: Cardiovascular;  Laterality: Left;  lt leg angio  . rotator cuff surgery    . THYROID SURGERY      OB History     No data available       Home Medications    Prior to Admission medications   Medication Sig Start Date End Date Taking? Authorizing Provider  amLODipine (NORVASC) 10 MG tablet Take 1 tablet (10 mg total) by mouth daily. 08/09/16   Forcucci, Courtney, PA-C  aspirin 325 MG tablet Take 325 mg by mouth daily.     [provider]  b complex vitamins capsule Take 1 capsule by mouth daily.    [provider]  Blood Glucose Monitoring Suppl (ACCU-CHEK AVIVA PLUS) W/DEVICE KIT Check blood sugar up to three times daily 03/26/15   [name], PA-C  Cholecalciferol (VITAMIN D3) 5000 UNITS TABS Take 1 capsule by mouth daily.    [provider]  CINNAMON PO Take 1,000 mg by mouth 2 (two) times daily.     [provider]  citalopram (CELEXA) 40 MG tablet TAKE 1 TABLET DAILY FOR MOOD & ANXIETY 01/17/17   [name], MD  clonazePAM (KLONOPIN) 0.5 MG tablet TAKE 1 TABLET BY MOUTH 2 TIMES DAILY 09/27/16   [name], MD  fluticasone [facility]) 50 MCG/ACT nasal spray Place 2 sprays into both nostrils daily. 08/09/16   Forcucci, Courtney, PA-C  glucose blood (ACCU-CHEK AVIVA PLUS) test strip Use daily to check BS TID Dx. E11.22 08/05/15   Forcucci, Courtney, PA-C  insulin NPH-regular Human (NOVOLIN 70/30) (70-30) 100 UNIT/ML injection Inject 25 units in the AM [place] with food, and inject 25 units [place] with evening meal. 02/26/16   [name], PA-C  Insulin Syringe-Needle U-100 31G X 15/64" 0.5 ML MISC Use two pens daily with insulin 02/23/16   [name], PA-C  IRON PO Take by mouth daily.    [provider]  losartan (COZAAR) 100 MG tablet Take 1 tablet (100 mg total) by mouth daily. 08/09/16 08/09/17  Forcucci, Courtney, PA-C  Magnesium 500 MG CAPS Take 1 capsule by mouth daily.    [provider]  metFORMIN (GLUCOPHAGE-XR) 500 MG 24 hr tablet Take 2 tablets (1,000 mg total) by mouth 2 (two) times daily. 08/09/16   Forcucci, Courtney, PA-C    Omega-3 Fatty Acids (FISH OIL) 1200 MG CAPS Take 1 capsule by mouth daily.    [provider]  omeprazole (PRILOSEC) 40 MG capsule  05/13/16   [provider]  simvastatin (ZOCOR) 20 MG tablet TAKE 1 TABLET (20 MG TOTAL) BY MOUTH AT BEDTIME. 08/09/16   Forcucci, Courtney, PA-C  vitamin E (VITAMIN E) 400 UNIT capsule Take 400 Units by mouth daily.    [provider]    Family History Family History  Problem Relation Age of Onset  . CAD Mother 45    Died of MI  . Hypertension Mother   . Heart attack Mother   . Heart disease Mother   . CAD Father 38    Died of MI  . Heart  disease Father   . Heart attack Father   . CAD Brother 76    Two brothers died of MI  . Heart attack Brother   . Heart disease Brother     Amputation  . Diabetes Sister   . Hypertension Sister   . Heart attack Brother   . Heart disease Brother     Social History Social History  Substance Use Topics  . Smoking status: Current Every Day Smoker    Packs/day: 0.50    Years: 40.00    Types: Cigarettes  . Smokeless tobacco: Never Used  . Alcohol use No     Allergies   Plavix [clopidogrel bisulfate]; Amoxicillin; Ace inhibitors; Lyrica [pregabalin]; and Penicillins   Review of Systems Review of Systems  Neurological: Positive for weakness and numbness.  All other systems reviewed and are negative.    Physical Exam Updated Vital Signs BP (!) 169/73 (BP Location: Left Arm)   Pulse 79   Temp 98.6 F (37 C) (Oral)   Resp 16   Ht 5' 6"  (1.676 m)   Wt 79.8 kg   SpO2 96%   BMI 28.41 kg/m   Physical Exam  Constitutional: She is oriented to person, place, and time. She appears well-developed and well-nourished.  HENT:  Head: Normocephalic and atraumatic.  Mouth/Throat: Oropharynx is clear and moist.  Eyes: Conjunctivae and EOM are normal. Pupils are equal, round, and reactive to light.  EOMs intact, no nystagmus  Neck: Normal range of motion.  Cardiovascular: Normal  rate, regular rhythm and normal heart sounds.   Pulmonary/Chest: Effort normal and breath sounds normal. No respiratory distress. She has no wheezes.  Abdominal: Soft. Bowel sounds are normal. There is no tenderness. There is no rebound.  Musculoskeletal: Normal range of motion.  Neurological: She is alert and oriented to person, place, and time.  AAOx3, answering questions and following commands appropriately; decreased strength and sensation of right arm and leg when compared with left; some drift when moving these extremities; decreased sensation of right upper and lower lips without tongue involvement; speech is clear and goal oriented; no facial droop  Skin: Skin is warm and dry.  Psychiatric: She has a normal mood and affect.  Nursing note and vitals reviewed.    ED Treatments / Results  Labs (all labs ordered are listed, but only abnormal results are displayed) Labs Reviewed  COMPREHENSIVE METABOLIC PANEL - Abnormal; Notable for the following:       Result Value   Glucose, Bld 234 (*)    Creatinine, Ser 1.08 (*)    Albumin 3.2 (*)    ALT 13 (*)    GFR calc non Af Amer 51 (*)    GFR calc Af Amer 59 (*)    All other components within normal limits  CBG MONITORING, ED - Abnormal; Notable for the following:    Glucose-Capillary 234 (*)    All other components within normal limits  I-STAT CHEM 8, ED - Abnormal; Notable for the following:    BUN 24 (*)    Glucose, Bld 234 (*)    Hemoglobin 15.3 (*)    All other components within normal limits  PROTIME-INR  APTT  CBC  DIFFERENTIAL  I-STAT TROPOININ, ED    EKG  EKG Interpretation None       Radiology Ct Head Wo Contrast  Result Date: 01/25/2017 CLINICAL DATA:  Right side weakness. EXAM: CT HEAD WITHOUT CONTRAST TECHNIQUE: Contiguous axial images were obtained from the base of the skull  through the vertex without intravenous contrast. COMPARISON:  MRI 07/19/2016 FINDINGS: Brain: No acute intracranial abnormality.  Specifically, no hemorrhage, hydrocephalus, mass lesion, acute infarction, or significant intracranial injury. Vascular: No hyperdense vessel or unexpected calcification. Skull: No acute calvarial abnormality. Sinuses/Orbits: Visualized paranasal sinuses and mastoids clear. Orbital soft tissues unremarkable. Other: None IMPRESSION: No acute intracranial abnormality. Electronically Signed   By: Rolm Baptise M.D.   On: 01/25/2017 08:45    Procedures Procedures (including critical care time)  Medications Ordered in ED Medications - No data to display   Initial Impression / Assessment and Plan / ED Course  I have reviewed the triage vital signs and the nursing notes.  Pertinent labs & imaging results that were available during my care of the patient were reviewed by me and considered in my medical decision making (see chart for details).  71 year old female here with right-sided weakness and paresthesias. Last known well 11 PM last evening. Reports she awoke with symptoms today. On exam she does have some weakness of her right arm and leg compared with left. She does have some drift with movement. She does report decreased sensation of her right lips, arm, and leg. She is having difficulty walking.  Screening labs and head CT overall reassuring. Patient does have history of TIAs as well as brain aneurysm 2 and I am concerned for acute stroke.  Discussed with neurology-- recommends admission with stroke work-up.  Patient admitted to medicine service for ongoing care.  Final Clinical Impressions(s) / ED Diagnoses   Final diagnoses:  Right sided weakness  Stroke (cerebrum) ([place])  Stroke (cerebrum) [facility])    New Prescriptions Current Discharge Medication List       Kathryne Hitch 01/25/17 1152    Carmin Muskrat, MD 01/30/17 0028

## 2017-01-25 NOTE — Progress Notes (Signed)
Patient arrived to room from ED. Safety precautions and orders reviewed. VSS. TELE applied and confirmed. No other distress noted. Will continue to monitor.  Ave Filter, RN

## 2017-01-25 NOTE — Progress Notes (Signed)
Rehab Admissions Coordinator Note:  Patient was screened by Cleatrice Burke for appropriateness for an Inpatient Acute Rehab Consult per PT and OT recommendations. At this time, we are recommending Inpatient Rehab consult. Please place order for possible admit .  Cleatrice Burke 01/25/2017, 5:51 PM  I can be reached at [phone].

## 2017-01-25 NOTE — Evaluation (Signed)
Physical Therapy Evaluation Patient Details Name: [name] MRN: [id number] DOB: May 20, 1946 Today's Date: 01/25/2017   History of Present Illness  Pt is a 71 y/o female who presents with R sided weakness and numbness. CT negative for acute changes, and MRI pending.   Clinical Impression  Pt admitted with above diagnosis. Pt currently with functional limitations due to the deficits listed below (see PT Problem List). At the time of PT eval pt was able to perform transfers and ambulation with gross min assist for balance support, walker management, and safety. Pt tearful at times during session - especially when discussing options for follow-up rehab. Pt is not interested in SNF/CIR, and wants to return home with HHPT to follow-up. Anticipate that pt will progress to be safe at home with 24 hour assist from husband. Pt will benefit from skilled PT to increase their independence and safety with mobility to allow discharge to the venue listed below.       Follow Up Recommendations Home health PT;Supervision/Assistance - 24 hour    Equipment Recommendations  Rolling walker with 5" wheels;3in1 (PT)    Recommendations for Other Services       Precautions / Restrictions Precautions Precautions: Fall Restrictions Weight Bearing Restrictions: No      Mobility  Bed Mobility               General bed mobility comments: Pt sitting up on EOB with RN when PT arrived.   Transfers Overall transfer level: Needs assistance Equipment used: Rolling walker (2 wheeled) Transfers: Sit to/from Stand Sit to Stand: Min assist         General transfer comment: Assist for balance support and power-up to full standing position. Increased time for pt to place R hand on walker and adjust grip.  Ambulation/Gait Ambulation/Gait assistance: Min assist Ambulation Distance (Feet): 40 Feet Assistive device: Rolling walker (2 wheeled) Gait Pattern/deviations: Step-through pattern;Decreased stride  length;Decreased weight shift to right;Decreased dorsiflexion - right;Trunk flexed Gait velocity: Decreased Gait velocity interpretation: Below normal speed for age/gender General Gait Details: VC's for sequencing and general safety. Pt was able to improve heel strike on R with cues but appeared effortful to maintain. Pt with difficulty maintaining closed grip on walker with R hand but was able to keep hand on walker throughout gait training.   Stairs            Wheelchair Mobility    Modified Rankin (Stroke Patients Only) Modified Rankin (Stroke Patients Only) Pre-Morbid Rankin Score: No symptoms Modified Rankin: Moderately severe disability     Balance Overall balance assessment: Needs assistance Sitting-balance support: Feet supported;No upper extremity supported Sitting balance-Leahy Scale: Fair     Standing balance support: Bilateral upper extremity supported;During functional activity Standing balance-Leahy Scale: Poor Standing balance comment: Requires RW for support at this time.                              Pertinent Vitals/Pain Pain Assessment: No/denies pain    Home Living Family/patient expects to be discharged to:: Private residence Living Arrangements: Spouse/significant other Available Help at Discharge: Family;Available 24 hours/day Type of Home: House Home Access: Stairs to enter Entrance Stairs-Rails: Right Entrance Stairs-Number of Steps: 4 Home Layout: One level Home Equipment: None      Prior Function Level of Independence: Independent               Hand Dominance  Extremity/Trunk Assessment   Upper Extremity Assessment Upper Extremity Assessment: RUE deficits/detail RUE Deficits / Details: Decreased strength and AROM. Please see OT note for more detail. RUE Sensation: decreased proprioception;decreased light touch (medial upper arm on R) RUE Coordination: decreased fine motor;decreased gross motor (Decreased  finger to nose )    Lower Extremity Assessment Lower Extremity Assessment: RLE deficits/detail RLE Deficits / Details: 4-/5 hip flexor, 3+/5 quads, 4-/5 hams, 4-/5 DF/PF RLE Sensation: decreased light touch;history of peripheral neuropathy;decreased proprioception RLE Coordination: decreased fine motor;decreased gross motor    Cervical / Trunk Assessment Cervical / Trunk Assessment: Normal  Communication   Communication: No difficulties  Cognition Arousal/Alertness: Awake/alert Behavior During Therapy: WFL for tasks assessed/performed (Tearful at times) Overall Cognitive Status: Within Functional Limits for tasks assessed                                        General Comments      Exercises     Assessment/Plan    PT Assessment Patient needs continued PT services  PT Problem List Decreased strength;Decreased range of motion;Decreased activity tolerance;Decreased balance;Decreased mobility;Decreased knowledge of use of DME;Decreased safety awareness;Decreased knowledge of precautions;Decreased coordination;Impaired sensation       PT Treatment Interventions DME instruction;Gait training;Stair training;Functional mobility training;Therapeutic activities;Therapeutic exercise;Neuromuscular re-education;Patient/family education    PT Goals (Current goals can be found in the Care Plan section)  Acute Rehab PT Goals Patient Stated Goal: Return home at d/c. Not interested in CIR.  PT Goal Formulation: With patient/family Time For Goal Achievement: 02/01/17 Potential to Achieve Goals: Good    Frequency Min 4X/week   Barriers to discharge        Co-evaluation               AM-PAC PT "6 Clicks" Daily Activity  Outcome Measure Difficulty turning over in bed (including adjusting bedclothes, sheets and blankets)?: Total Difficulty moving from lying on back to sitting on the side of the bed? : Total Difficulty sitting down on and standing up from a chair  with arms (e.g., wheelchair, bedside commode, etc,.)?: Total Help needed moving to and from a bed to chair (including a wheelchair)?: A Little Help needed walking in hospital room?: A Lot Help needed climbing 3-5 steps with a railing? : A Lot 6 Click Score: 10    End of Session Equipment Utilized During Treatment: Gait belt Activity Tolerance: Patient tolerated treatment well Patient left: in chair;with call bell/phone within reach;with chair alarm set;with family/visitor present Nurse Communication: Mobility status PT Visit Diagnosis: Other symptoms and signs involving the nervous system (R29.898);Unsteadiness on feet (R26.81);Hemiplegia and hemiparesis Hemiplegia - Right/Left: Right Hemiplegia - caused by: Unspecified (Unclear at this time whether CVA)    Time: 2671-2458 PT Time Calculation (min) (ACUTE ONLY): 32 min   Charges:   PT Evaluation $PT Eval Moderate Complexity: 1 Procedure PT Treatments $Gait Training: 8-22 mins   PT G Codes:        Rolinda Roan, PT, DPT Acute Rehabilitation Services Pager: [phone] 01/25/2017, 1:57 PM

## 2017-01-25 NOTE — Progress Notes (Signed)
OT Cancellation Note  Patient Details Name: [name] MRN: [id number] DOB: 14-Aug-1946   Cancelled Treatment:    Reason Eval/Treat Not Completed: Patient at procedure or test/ unavailable (pt at MRI). Will return later if able.   [place], OT/L  [phone] 01/25/2017 01/25/2017, 2:46 PM

## 2017-01-25 NOTE — ED Triage Notes (Signed)
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

## 2017-01-26 ENCOUNTER — Inpatient Hospital Stay (HOSPITAL_COMMUNITY): Payer: Medicare Other

## 2017-01-26 ENCOUNTER — Ambulatory Visit: Payer: Self-pay | Admitting: Internal Medicine

## 2017-01-26 DIAGNOSIS — I639 Cerebral infarction, unspecified: Principal | ICD-10-CM

## 2017-01-26 DIAGNOSIS — I69398 Other sequelae of cerebral infarction: Secondary | ICD-10-CM

## 2017-01-26 DIAGNOSIS — R269 Unspecified abnormalities of gait and mobility: Secondary | ICD-10-CM

## 2017-01-26 DIAGNOSIS — I6789 Other cerebrovascular disease: Secondary | ICD-10-CM

## 2017-01-26 DIAGNOSIS — G8191 Hemiplegia, unspecified affecting right dominant side: Secondary | ICD-10-CM

## 2017-01-26 LAB — BASIC METABOLIC PANEL
ANION GAP: 9 (ref 5–15)
BUN: 16 mg/dL (ref 6–20)
CO2: 29 mmol/L (ref 22–32)
Calcium: 8.8 mg/dL — ABNORMAL LOW (ref 8.9–10.3)
Chloride: 100 mmol/L — ABNORMAL LOW (ref 101–111)
Creatinine, Ser: 1.14 mg/dL — ABNORMAL HIGH (ref 0.44–1.00)
GFR, EST AFRICAN AMERICAN: 55 mL/min — AB (ref >60)
GFR, EST NON AFRICAN AMERICAN: 48 mL/min — AB (ref >60)
Glucose, Bld: 258 mg/dL — ABNORMAL HIGH (ref 65–99)
POTASSIUM: 4.4 mmol/L (ref 3.5–5.1)
SODIUM: 138 mmol/L (ref 135–145)

## 2017-01-26 LAB — CBC
HCT: 42.8 % (ref 36.0–46.0)
Hemoglobin: 14.1 g/dL (ref 12.0–15.0)
MCH: 30.9 pg (ref 26.0–34.0)
MCHC: 32.9 g/dL (ref 30.0–36.0)
MCV: 93.9 fL (ref 78.0–100.0)
PLATELETS: 272 10*3/uL (ref 150–400)
RBC: 4.56 MIL/uL (ref 3.87–5.11)
RDW: 12.1 % (ref 11.5–15.5)
WBC: 9.6 10*3/uL (ref 4.0–10.5)

## 2017-01-26 LAB — GLUCOSE, CAPILLARY
GLUCOSE-CAPILLARY: 227 mg/dL — AB (ref 65–99)
Glucose-Capillary: 259 mg/dL — ABNORMAL HIGH (ref 65–99)
Glucose-Capillary: 213 mg/dL — ABNORMAL HIGH (ref 65–99)
Glucose-Capillary: 219 mg/dL — ABNORMAL HIGH (ref 65–99)

## 2017-01-26 LAB — LIPID PANEL
CHOL/HDL RATIO: 4.5 ratio
CHOLESTEROL: 156 mg/dL (ref 0–200)
HDL: 35 mg/dL — ABNORMAL LOW (ref >40)
LDL Cholesterol: 92 mg/dL (ref 0–99)
TRIGLYCERIDES: 144 mg/dL (ref <150)
VLDL: 29 mg/dL (ref 0–40)

## 2017-01-26 LAB — ECHOCARDIOGRAM COMPLETE
HEIGHTINCHES: 66 in
Weight: 2816 oz

## 2017-01-26 MED ORDER — INSULIN ASPART PROT & ASPART (70-30 MIX) 100 UNIT/ML ~~LOC~~ SUSP
15.0000 [IU] | Freq: Every day | SUBCUTANEOUS | Status: DC
  Administered 2017-01-27: 15 [IU] via SUBCUTANEOUS

## 2017-01-26 MED ORDER — INSULIN ASPART PROT & ASPART (70-30 MIX) 100 UNIT/ML ~~LOC~~ SUSP
5.0000 [IU] | Freq: Once | SUBCUTANEOUS | Status: AC
  Administered 2017-01-26: 5 [IU] via SUBCUTANEOUS

## 2017-01-26 MED ORDER — ENOXAPARIN SODIUM 40 MG/0.4ML ~~LOC~~ SOLN
40.0000 mg | SUBCUTANEOUS | Status: DC
  Administered 2017-01-26: 40 mg via SUBCUTANEOUS
  Filled 2017-01-26: qty 0.4

## 2017-01-26 NOTE — Progress Notes (Signed)
Inpatient Rehabilitation  I met with the patient and her husband at the bedside to discuss the recommendation for IP Rehab.  I provided informational booklets and answered their questions.  Pt. initially hesitant about IP Rehab.  We discussed the benefits of intensive rehab following a stroke.  Pt. and husband agree that she needs to come to CIR and she has agreed to do so.  I spoke with Dr. Allyson Sabal who would like pt. to admit tomorrow.  Gunnar Fusi will follow up with pt. And husband tomorrow in my absence.  Please call if questions.  [place] Admissions Coordinator Cell [phone] Office [phone]

## 2017-01-26 NOTE — Progress Notes (Signed)
Echocardiogram 2D Echocardiogram has been performed.  01/26/2017 11:24 AM Maudry Mayhew, BS, RVT, RDCS, RDMS

## 2017-01-26 NOTE — Progress Notes (Addendum)
STROKE TEAM PROGRESS NOTE   HISTORY OF PRESENT ILLNESS (per record) [name] is an 71 y.o. female with medical history significant of brain aneurysm, diabetes, GERD, hiatal hernia, hyperlipidemia, hypertension, migraines, PVD, TIA presenting with right-sided weakness and decreased sensation. Patient's last known normal at 23:00 on 01/24/2017. Patient reports awaking in on the morning of admission with right-sided extremity weakness and decreased sensation as well as right facial droop.    Currently her main complaint is decreased sensation in her right hand and a sensation of feeling of clumsiness with her right hand. She also feels that her right leg feels weak and decreased sensation.  Patient was not administered IV t-PA secondary to being out of window. She was admitted for further evaluation and treatment.   SUBJECTIVE (INTERVAL HISTORY) No family is at bedside. Pt is sitting in chair. Complaining of right face, lip, right hand numbness, b/l toe numbness. Still has subtle right hand clumsiness. Pt admitted that for the last 3 months, she has a lot of stress from her daughter who had several surgeries and her husband who is not in good shape. She would like to go home instead of CIR.    OBJECTIVE Temp:  [97.7 F (36.5 C)-98.3 F (36.8 C)] 97.8 F (36.6 C) (05/09 0433) Pulse Rate:  [63-71] 71 (05/09 0433) Cardiac Rhythm: Normal sinus rhythm (05/09 0700) Resp:  [16-20] 20 (05/09 0433) BP: (151-194)/(56-74) 180/63 (05/09 0433) SpO2:  [91 %-96 %] 91 % (05/09 0433)  CBC:  Recent Labs Lab 01/25/17 0816 01/25/17 0836 01/26/17 0440  WBC 10.1  --  9.6  NEUTROABS 7.0  --   --   HGB 14.6 15.3* 14.1  HCT 43.9 45.0 42.8  MCV 94.2  --  93.9  PLT 313  --  709    Basic Metabolic Panel:  Recent Labs Lab 01/25/17 0816 01/25/17 0836 01/26/17 0440  NA 137 139 138  K 3.9 4.1 4.4  CL 104 103 100*  CO2 24  --  29  GLUCOSE 234* 234* 258*  BUN 19 24* 16  CREATININE 1.08* 1.00  1.14*  CALCIUM 9.0  --  8.8*    Lipid Panel:    Component Value Date/Time   CHOL 156 01/26/2017 0440   TRIG 144 01/26/2017 0440   HDL 35 (L) 01/26/2017 0440   CHOLHDL 4.5 01/26/2017 0440   VLDL 29 01/26/2017 0440   LDLCALC 92 01/26/2017 0440   HgbA1c:  Lab Results  Component Value Date   HGBA1C 10.9 (H) 11/10/2016   Urine Drug Screen: No results found for: LABOPIA, COCAINSCRNUR, LABBENZ, AMPHETMU, THCU, LABBARB  Alcohol Level     Component Value Date/Time   ETH <5 10/04/2014 1545    IMAGING I have personally reviewed the radiological images below and agree with the radiology interpretations.  Ct Head Wo Contrast 01/25/2017 No acute intracranial abnormality.   Mr Brain Wo Contrast 01/25/2017 1 cm acute infarct left lateral thalamus and internal capsule Chronic infarct right cerebellum   Mr Lovenia Kim 01/25/2017 Moderate stenosis left posterior cerebral artery. No other significant intracranial stenosis.   CUS pending  TTE pending   PHYSICAL EXAM  Temp:  [97.7 F (36.5 C)-98.3 F (36.8 C)] 97.9 F (36.6 C) (05/09 0919) Pulse Rate:  [63-71] 70 (05/09 0919) Resp:  [16-20] 16 (05/09 0919) BP: (156-190)/(56-72) 182/56 (05/09 0919) SpO2:  [91 %-96 %] 93 % (05/09 0919)  General - Well nourished, well developed, in no apparent distress.  Ophthalmologic - Sharp disc margins  OU.   Cardiovascular - Regular rate and rhythm.  Mental Status -  Level of arousal and orientation to time, place, and person were intact. Language including expression, naming, repetition, comprehension was assessed and found intact. Attention span and concentration were normal. Fund of Knowledge was assessed and was intact.  Cranial Nerves II - XII - II - Visual field intact OU. III, IV, VI - Extraocular movements intact. V - right facial tough and pinprick sensation decreased, about 50% comparing with left. VII - Facial movement intact bilaterally. VIII - Hearing & vestibular intact  bilaterally. X - Palate elevates symmetrically. XI - Chin turning & shoulder shrug intact bilaterally. XII - Tongue protrusion intact.  Motor Strength - The patient's strength was normal in all extremities except subtle right hand dexterity difficulty and pronator drift was absent.  Bulk was normal and fasciculations were absent.   Motor Tone - Muscle tone was assessed at the neck and appendages and was normal.  Reflexes - The patient's reflexes were 1+ in all extremities and she had no pathological reflexes.  Sensory - Light touch, temperature/pinprick were assessed and were symmetrical except decreased at right hand, 50% of the left.    Coordination - The patient had normal movements in the hands and feet with no ataxia or dysmetria.  Tremor was absent.  Gait and Station - deferred as pt is going to vascular lab   ASSESSMENT/PLAN [name] is a 71 y.o. female with history of brain aneurysm, diabetes, GERD, hiatal hernia, hyperlipidemia, hypertension, migraines, PVD, TIA  presenting with Right facial droop with Right-sided weakness with decreased sensation. She did not receive IV t-PA due to being outside of the window.   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 Old right cerebellar infarct  MRA  moderate stenosis L PCA  Carotid Doppler  pending  2D Echo  pending  LDL 92  HgbA1c pending, 10.9 in Feb  SCDs for VTE prophylaxis  Diet Heart Room service appropriate? Yes; Fluid consistency: Thin  aspirin 325 mg daily prior to admission, now on aspirin 325 mg daily. Pt has plavix allergy, will continue ASA on discharge.   Patient counseled to be compliant with her antithrombotic medications  Ongoing aggressive stroke risk factor management  Therapy recommendations:  CIR  Disposition:   pending   Hypertension  Stable  Permissive hypertension (OK if < 220/120) but gradually normalize in 5-7 days  Long-term BP goal normotensive  Hyperlipidemia  Home meds:  zocor 20 and fish oil, resumed in hospital  LDL 92, goal < 70  Continue statin at discharge  Diabetes  HgbA1c pending, 10.9 in Feb, goal < 7.0  Uncontrolled  Pt aware of hyperglycemia and plan to see endocrinologist   SSI  CBG monitoring  Tobacco abuse  Current smoker  Smoking cessation counseling provided  Pt is willing to quit  Other Stroke Risk Factors  Advanced age  Hx stroke/TIA - old right cerebellar infarct on MRI   Migraines  PVD s/p fem-pop, stent, followed by [facility]  Right ICA siphon 68mm aneurysm - outpt follow up  Other Active Problems  Depression/anxiety on Klonopin and [facility] day # 1  Rosalin Hawking, MD PhD Stroke Neurology 01/26/2017 11:24 AM   To contact Stroke Continuity provider, please refer to [email or web address] After  hours, contact General Neurology

## 2017-01-26 NOTE — Progress Notes (Signed)
*  PRELIMINARY RESULTS* Vascular Ultrasound Carotid Duplex (Doppler) has been completed.  Preliminary findings: Findings consistent with a 1- 89 percent stenosis involving the right internal carotid artery and the left internal carotid artery.  Left carotid evaluation could be underestimated due to moderate amount of calcified plaque.  Bilateral vertebral arteries are patent and antegrade.  Everrett Coombe 01/26/2017, 4:04 PM

## 2017-01-26 NOTE — Progress Notes (Signed)
Physical Therapy Treatment Patient Details Name: [name] MRN: [id number] DOB: 30-Sep-1945 Today's Date: 01/26/2017    History of Present Illness Pt is a 71 y/o female who presents with R sided weakness and numbness.  MRI acute infarct left lateral thalamus and internal capsule.     PT Comments    Patient very anxious and distracted this session. Worried about needing to take care of things at home if she goes to rehab. Problem solved ways to address these concerns and discussed the importance of focusing on recovery and therapy at this time. Tolerated gait training with Min A due to impaired proprioception RLE and difficulty multi tasking during activities resulting in LOB. Pt continues to have numbness in right hand and face. Mobility is improving. Discharge recommendation updated to CIR as pt highly functioning, working and independent PTA. Will follow.   Follow Up Recommendations  CIR;Supervision for mobility/OOB     Equipment Recommendations  None recommended by PT    Recommendations for Other Services       Precautions / Restrictions Precautions Precautions: Fall Restrictions Weight Bearing Restrictions: No    Mobility  Bed Mobility               General bed mobility comments: Pt sitting in recliner at start of session  Transfers Overall transfer level: Needs assistance Equipment used: None Transfers: Sit to/from Stand Sit to Stand: Min guard         General transfer comment: Min guard for safety. Stood from Automotive engineer.  Ambulation/Gait Ambulation/Gait assistance: Min assist Ambulation Distance (Feet): 130 Feet Assistive device: None Gait Pattern/deviations: Step-through pattern;Decreased stride length;Narrow base of support;Scissoring;Staggering left;Staggering right Gait velocity: Decreased Gait velocity interpretation: Below normal speed for age/gender General Gait Details: Slow, unsteady gait with impaired proprioception RLE; some narrow BoS and  scissoring gait esp when performing tasks (counting, talking). LOB x2. Right knee instability but no buckling evident.   Stairs            Wheelchair Mobility    Modified Rankin (Stroke Patients Only) Modified Rankin (Stroke Patients Only) Pre-Morbid Rankin Score: No symptoms Modified Rankin: Moderately severe disability     Balance Overall balance assessment: Needs assistance Sitting-balance support: Feet supported;No upper extremity supported Sitting balance-Leahy Scale: Good Sitting balance - Comments: Performed half standing mini squats from chair with hands on thighs to emphasize input into RUE/LE, LOB x3 during exercise.   Standing balance support: During functional activity;No upper extremity supported Standing balance-Leahy Scale: Fair Standing balance comment: Reuqires Min guard-Min A for balance during dynamic tasks.              High level balance activites: Side stepping;Direction changes;Backward walking High Level Balance Comments: Pt with LOB and scissoring gait with head turn to right; able to perform side stepping with BUE support and backward walking with UE support- very slow and guarded.            Cognition Arousal/Alertness: Awake/alert Behavior During Therapy: WFL for tasks assessed/performed;Anxious Overall Cognitive Status: Within Functional Limits for tasks assessed                                 General Comments: Very distracted by personal finances and needing to get stuff done. Seems to have some anxiety and stress. Problem solved with pt how to handle these concerns. Difficulty counting down from 100 by 2s during walking.  Exercises      General Comments General comments (skin integrity, edema, etc.): Spouse present during session.       Pertinent Vitals/Pain Pain Assessment: No/denies pain    Home Living                      Prior Function            PT Goals (current goals can now be found  in the care plan section) Acute Rehab PT Goals Patient Stated Goal: to get better Progress towards PT goals: Progressing toward goals    Frequency    Min 4X/week      PT Plan Discharge plan needs to be updated    Co-evaluation              AM-PAC PT "6 Clicks" Daily Activity  Outcome Measure  Difficulty turning over in bed (including adjusting bedclothes, sheets and blankets)?: None Difficulty moving from lying on back to sitting on the side of the bed? : None Difficulty sitting down on and standing up from a chair with arms (e.g., wheelchair, bedside commode, etc,.)?: A Little Help needed moving to and from a bed to chair (including a wheelchair)?: A Little Help needed walking in hospital room?: A Little Help needed climbing 3-5 steps with a railing? : A Lot 6 Click Score: 19    End of Session Equipment Utilized During Treatment: Gait belt Activity Tolerance: Patient tolerated treatment well Patient left: in chair;with call bell/phone within reach;with chair alarm set;with family/visitor present Nurse Communication: Mobility status PT Visit Diagnosis: Other symptoms and signs involving the nervous system (R29.898);Unsteadiness on feet (R26.81);Hemiplegia and hemiparesis Hemiplegia - Right/Left: Right Hemiplegia - caused by: Cerebral infarction     Time: 8177-1165 PT Time Calculation (min) (ACUTE ONLY): 24 min  Charges:  $Gait Training: 8-22 mins $Neuromuscular Re-education: 8-22 mins                    G Codes:       Wray Kearns, PT, DPT [phone]     [place] 01/26/2017, 4:31 PM

## 2017-01-26 NOTE — Care Management Note (Signed)
Case Management Note  Patient Details  Name: [name] MRN: [id number] Date of Birth: April 04, 1946  Subjective/Objective:  Pt admitted with CVA. She is from home with her spouse.                  Action/Plan: PT recommending HH and OT recommending CIR. CM following for d/c disposition.   Expected Discharge Date:   (unsure)               Expected Discharge Plan:  [place]  In-House Referral:     Discharge planning Services     Post Acute Care Choice:    Choice offered to:     DME Arranged:    DME Agency:     HH Arranged:    [place] Agency:     Status of Service:  In process, will continue to follow  If discussed at Long Length of Stay Meetings, dates discussed:    Additional Comments:  Pollie Friar, RN 01/26/2017, 1:14 PM

## 2017-01-26 NOTE — Consult Note (Signed)
Physical Medicine and Rehabilitation Consult Reason for Consult: Right sided weakness Referring Physician: [facility]   HPI: [name] is a 71 y.o. right handed female with history of right brain aneurysm 2013 with no surgical intervention, CVA 2011, hypertension, hyperlipidemia, PVD with stenting. Per chart review patient lives with spouse was independent prior to admission and still working part time. Presented 01/25/2017 with right-sided weakness. MRI showed a 1 cm acute infarct left lateral thalamus and internal capsule. Chronic infarct right cerebellum. MRA with moderate stenosis left posterior cerebral artery. No other significant intracranial stenosis. Patient did not receive TPA. Echocardiogram pending. Neurology consulted presently on aspirin for CVA prophylaxis. Tolerating a regular consistency diet. Physical therapy evaluation completed 01/25/2017 with recommendations of physical medicine rehabilitation consult.   Review of Systems  Constitutional: Negative for chills and fever.  HENT: Negative for hearing loss.   Eyes: Negative for blurred vision and double vision.  Respiratory: Negative for cough and shortness of breath.   Cardiovascular: Positive for leg swelling. Negative for chest pain and palpitations.  Gastrointestinal: Positive for constipation. Negative for nausea and vomiting.       GERD  Genitourinary: Negative for dysuria, flank pain and hematuria.  Musculoskeletal: Positive for joint pain and myalgias.  Skin: Negative for rash.  Neurological: Positive for sensory change, weakness and headaches.  Psychiatric/Behavioral:       Anxiety  All other systems reviewed and are negative.  Past Medical History:  Diagnosis Date  . Abnormal findings on esophagogastroduodenoscopy (EGD) 07/2010  . Aneurysm [facility]) 2013   Right Brain   . Aneurysm ([place])    in brain x 2  . Anxiety   . Colon polyps   . Diabetes mellitus 1998  . Diverticulosis   . GERD (gastroesophageal  reflux disease)   . Hemorrhoids   . Hiatal hernia   . Hyperlipidemia   . Hypertension 2000  . Migraines   . Peripheral vascular disease ([place])   . Phlebitis    30 years ago  left leg  . Status post colonoscopy 07/2010  . Stroke [facility])    multiple mini strokes ( brain aneurysm )   Past Surgical History:  Procedure Laterality Date  . ABDOMINAL AORTAGRAM N/A 01/04/2012   Procedure: ABDOMINAL Maxcine Ham;  Surgeon: Serafina Mitchell, MD;  Location: [facility] CATH LAB;  Service: Cardiovascular;  Laterality: N/A;  . ABDOMINAL AORTAGRAM N/A 08/15/2012   Procedure: ABDOMINAL Maxcine Ham;  Surgeon: Serafina Mitchell, MD;  Location: [facility] CATH LAB;  Service: Cardiovascular;  Laterality: N/A;  . ABDOMINAL HYSTERECTOMY  1984  . cataract surgery Right 10-22-2015  . cataract surgery Left 11-05-2015  . CESAREAN SECTION    . CHOLECYSTECTOMY    . COLONOSCOPY  07/2010  . DENTAL SURGERY  Aug. 16, 2013   left lower   . ESOPHAGOGASTRODUODENOSCOPY  07/2010  . EYE SURGERY  Nov. 2014   Laser-Glaucoma  . FEMORAL ARTERY STENT  05/11/11   Left superficial femoral and popliteal artery  . FEMORAL-POPLITEAL BYPASS GRAFT  Nov. 26, 2013   Left Angiogram-   . HERNIA REPAIR     times two  . KNEE SURGERY    . LOWER EXTREMITY ANGIOGRAM Left 08/15/2012   Procedure: LOWER EXTREMITY ANGIOGRAM;  Surgeon: Serafina Mitchell, MD;  Location: [facility] CATH LAB;  Service: Cardiovascular;  Laterality: Left;  lt leg angio  . rotator cuff surgery    . THYROID SURGERY     Family History  Problem Relation Age of Onset  .  CAD Mother 50    Died of MI  . Hypertension Mother   . Heart attack Mother   . Heart disease Mother   . CAD Father 11    Died of MI  . Heart disease Father   . Heart attack Father   . CAD Brother 48    Two brothers died of MI  . Heart attack Brother   . Heart disease Brother     Amputation  . Diabetes Sister   . Hypertension Sister   . Heart attack Brother   . Heart disease Brother    Social History:  reports that  she has been smoking Cigarettes.  She has a 20.00 pack-year smoking history. She has never used smokeless tobacco. She reports that she does not drink alcohol or use drugs. Allergies:  Allergies  Allergen Reactions  . Plavix [Clopidogrel Bisulfate] Palpitations  . Amoxicillin Itching and Swelling    FACE & EYES SWELL  . Ace Inhibitors   . Lyrica [Pregabalin]   . Penicillins Other (See Comments)    REACTION: unspecified   Medications Prior to Admission  Medication Sig Dispense Refill  . amLODipine (NORVASC) 10 MG tablet Take 1 tablet (10 mg total) by mouth daily. 90 tablet 2  . aspirin 325 MG tablet Take 325 mg by mouth daily.     Marland Kitchen b complex vitamins capsule Take 1 capsule by mouth daily.    . Cholecalciferol (VITAMIN D3) 5000 UNITS TABS Take 1 capsule by mouth daily.    Marland Kitchen CINNAMON PO Take 1,000 mg by mouth 2 (two) times daily.     . citalopram (CELEXA) 40 MG tablet TAKE 1 TABLET DAILY FOR MOOD & ANXIETY 90 tablet 1  . clonazePAM (KLONOPIN) 0.5 MG tablet TAKE 1 TABLET BY MOUTH 2 TIMES DAILY 180 tablet 1  . fluticasone (FLONASE) 50 MCG/ACT nasal spray Place 2 sprays into both nostrils daily. 16 g 0  . glucose blood (ACCU-CHEK AVIVA PLUS) test strip Use daily to check BS TID Dx. E11.22 300 each 1  . insulin NPH-regular Human (NOVOLIN 70/30) (70-30) 100 UNIT/ML injection Inject 25 units in the AM [place] with food, and inject 25 units [place] with evening meal. (Patient taking differently: Inject 12-20 Units into the skin 2 (two) times daily with a meal. Inject 12 units in the AM [place] with food, and inject 20 units [place] with evening meal.) 60 mL 4  . Insulin Syringe-Needle U-100 31G X 15/64" 0.5 ML MISC Use two pens daily with insulin 100 each 3  . IRON PO Take by mouth daily.    Marland Kitchen losartan (COZAAR) 100 MG tablet Take 1 tablet (100 mg total) by mouth daily. 90 tablet 2  . Magnesium 500 MG CAPS Take 1 capsule by mouth daily.    . metFORMIN (GLUCOPHAGE-XR) 500 MG 24 hr tablet Take 2 tablets (1,000 mg total)  by mouth 2 (two) times daily. 360 tablet 1  . Omega-3 Fatty Acids (FISH OIL) 1200 MG CAPS Take 1 capsule by mouth daily.    Marland Kitchen omeprazole (PRILOSEC) 40 MG capsule     . simvastatin (ZOCOR) 20 MG tablet TAKE 1 TABLET (20 MG TOTAL) BY MOUTH AT BEDTIME. 90 tablet 2  . vitamin E (VITAMIN E) 400 UNIT capsule Take 400 Units by mouth daily.    . Blood Glucose Monitoring Suppl (ACCU-CHEK AVIVA PLUS) W/DEVICE KIT Check blood sugar up to three times daily 1 kit 0    Home: Home Living Family/patient expects to be discharged to::  Private residence Living Arrangements: Spouse/significant other Available Help at Discharge: Family, Available 24 hours/day Type of Home: House Home Access: Stairs to enter [employer] of Steps: 4 Entrance Stairs-Rails: Right Home Layout: One level Bathroom Shower/Tub: Chiropodist: Standard Bathroom Accessibility: Yes Home Equipment: None  Functional History: Prior Function Level of Independence: Independent Comments: works as Audiological scientist; drives Functional Status:  Mobility: Bed Mobility General bed mobility comments: Pt sitting up on EOB with RN when PT arrived.  Transfers Overall transfer level: Needs assistance Equipment used: Rolling walker (2 wheeled) Transfers: Sit to/from Stand Sit to Stand: Min assist General transfer comment: LOB initially when stnaidng Ambulation/Gait Ambulation/Gait assistance: Min assist Ambulation Distance (Feet): 40 Feet Assistive device: Rolling walker (2 wheeled) Gait Pattern/deviations: Step-through pattern, Decreased stride length, Decreased weight shift to right, Decreased dorsiflexion - right, Trunk flexed General Gait Details: VC's for sequencing and general safety. Pt was able to improve heel strike on R with cues but appeared effortful to maintain. Pt with difficulty maintaining closed grip on walker with R hand but was able to keep hand on walker throughout gait training.   Gait velocity: Decreased Gait velocity interpretation: Below normal speed for age/gender    ADL: ADL Overall ADL's : Needs assistance/impaired Eating/Feeding: Set up Eating/Feeding Details (indicate cue type and reason): unable to feed self with R dominant hand Grooming: Minimal assistance Upper Body Bathing: Minimal assistance, Sitting Lower Body Bathing: Minimal assistance, Sit to/from stand Upper Body Dressing : Minimal assistance, Sitting Lower Body Dressing: Moderate assistance, Sit to/from stand Toilet Transfer: Moderate assistance, Ambulation Toileting- Clothing Manipulation and Hygiene: Moderate assistance, Sit to/from stand Functional mobility during ADLs: Moderate assistance General ADL Comments: LOB to R during ambulation for ADL. Pt has difficulty using R dominant UE functionally  Cognition: Cognition Overall Cognitive Status: Within Functional Limits for tasks assessed (will further assess attention) Orientation Level: Oriented X4 Cognition Arousal/Alertness: Awake/alert Behavior During Therapy: WFL for tasks assessed/performed (Tearful at times) Overall Cognitive Status: Within Functional Limits for tasks assessed (will further assess attention) General Comments: Appears intact. Will further assess  Blood pressure (!) 182/56, pulse 70, temperature 97.9 F (36.6 C), temperature source Oral, resp. rate 16, height _0  (1.676 m), weight 79.8 kg (176 lb), SpO2 93 %. Physical Exam  Vitals reviewed. Constitutional: She is oriented to person, place, and time. She appears well-developed.  HENT:  Mild right facial weakness with tongue midline  Eyes: EOM are normal.  Neck: Normal range of motion. Neck supple. No thyromegaly present.  Cardiovascular: Normal rate and regular rhythm.   Respiratory: Effort normal and breath sounds normal. No respiratory distress.  GI: Soft. Bowel sounds are normal. She exhibits no distension. There is no tenderness.  Neurological: She is  alert and oriented to person, place, and time.  Follow spoken commands. Speech is fluent. Fair awareness of deficits  Skin: Skin is warm and dry.  Motor strength is 4/5 in the right deltoid, biceps, triceps, grip, hip flexor, knee extensor, ankle dorsiflexor, 5/5 on the left side. Able to identify light touch and proprioception in the right upper and  Results for orders placed or performed during the hospital encounter of 01/25/17 (from the past 24 hour(s))  Glucose, capillary     Status: Abnormal   Collection Time: 01/25/17 12:11 PM  Result Value Ref Range   Glucose-Capillary 211 (H) 65 - 99 mg/dL  Glucose, capillary     Status: Abnormal   Collection Time: 01/25/17  4:44 PM  Result Value Ref Range   Glucose-Capillary 167 (H) 65 - 99 mg/dL  Glucose, capillary     Status: Abnormal   Collection Time: 01/25/17  9:17 PM  Result Value Ref Range   Glucose-Capillary 266 (H) 65 - 99 mg/dL   Comment 1 Notify RN    Comment 2 Document in Chart   CBC     Status: None   Collection Time: 01/26/17  4:40 AM  Result Value Ref Range   WBC 9.6 4.0 - 10.5 K/uL   RBC 4.56 3.87 - 5.11 MIL/uL   Hemoglobin 14.1 12.0 - 15.0 g/dL   HCT 42.8 36.0 - 46.0 %   MCV 93.9 78.0 - 100.0 fL   MCH 30.9 26.0 - 34.0 pg   MCHC 32.9 30.0 - 36.0 g/dL   RDW 12.1 11.5 - 15.5 %   Platelets 272 150 - 400 K/uL  Basic metabolic panel     Status: Abnormal   Collection Time: 01/26/17  4:40 AM  Result Value Ref Range   Sodium 138 135 - 145 mmol/L   Potassium 4.4 3.5 - 5.1 mmol/L   Chloride 100 (L) 101 - 111 mmol/L   CO2 29 22 - 32 mmol/L   Glucose, Bld 258 (H) 65 - 99 mg/dL   BUN 16 6 - 20 mg/dL   Creatinine, Ser 1.14 (H) 0.44 - 1.00 mg/dL   Calcium 8.8 (L) 8.9 - 10.3 mg/dL   GFR calc non Af Amer 48 (L) >60 mL/min   GFR calc Af Amer 55 (L) >60 mL/min   Anion gap 9 5 - 15  Lipid panel     Status: Abnormal   Collection Time: 01/26/17  4:40 AM  Result Value Ref Range   Cholesterol 156 0 - 200 mg/dL   Triglycerides 144  <150 mg/dL   HDL 35 (L) >40 mg/dL   Total CHOL/HDL Ratio 4.5 RATIO   VLDL 29 0 - 40 mg/dL   LDL Cholesterol 92 0 - 99 mg/dL  Glucose, capillary     Status: Abnormal   Collection Time: 01/26/17  6:14 AM  Result Value Ref Range   Glucose-Capillary 259 (H) 65 - 99 mg/dL   Comment 1 Notify RN    Comment 2 Document in Chart    Ct Head Wo Contrast  Result Date: 01/25/2017 CLINICAL DATA:  Right side weakness. EXAM: CT HEAD WITHOUT CONTRAST TECHNIQUE: Contiguous axial images were obtained from the base of the skull through the vertex without intravenous contrast. COMPARISON:  MRI 07/19/2016 FINDINGS: Brain: No acute intracranial abnormality. Specifically, no hemorrhage, hydrocephalus, mass lesion, acute infarction, or significant intracranial injury. Vascular: No hyperdense vessel or unexpected calcification. Skull: No acute calvarial abnormality. Sinuses/Orbits: Visualized paranasal sinuses and mastoids clear. Orbital soft tissues unremarkable. Other: None IMPRESSION: No acute intracranial abnormality. Electronically Signed   By: Rolm Baptise M.D.   On: 01/25/2017 08:45   Mr Brain Wo Contrast  Result Date: 01/25/2017 CLINICAL DATA:  Stroke. Diabetes hyperlipidemia hypertension. Right-sided weakness and decreased sensation EXAM: MRI HEAD WITHOUT CONTRAST MRA HEAD WITHOUT CONTRAST TECHNIQUE: Multiplanar, multiecho pulse sequences of the brain and surrounding structures were obtained without intravenous contrast. Angiographic images of the head were obtained using MRA technique without contrast. COMPARISON:  CT head 01/25/2017, MRI head 07/19/2016 FINDINGS: MRI HEAD FINDINGS Brain: Acute infarct left lateral thalamus hiatal with possible involvement of some internal capsule fibers. This measures 1 cm. No other acute infarct Chronic infarct right cerebellum unchanged. Chronic microvascular ischemia in the white matter and  pons of a mild degree. Negative for hemorrhage or mass. Ventricle size normal. Vascular:  Normal arterial flow voids. Skull and upper cervical spine: Negative Sinuses/Orbits: Negative sinuses.  Bilateral cataract removal Other: None MRA HEAD FINDINGS Both vertebral arteries are patent to the basilar. Basilar widely patent. Moderate stenosis mid left posterior cerebral artery. Right posterior cerebral artery widely patent without stenosis. Internal carotid artery widely patent bilaterally. Anterior and middle cerebral arteries patent without stenosis. Negative for cerebral aneurysm. IMPRESSION: 1 cm acute infarct left lateral thalamus and internal capsule Chronic infarct right cerebellum Moderate stenosis left posterior cerebral artery. No other significant intracranial stenosis. Electronically Signed   By: Franchot Gallo M.D.   On: 01/25/2017 15:55   Mr Jodene Nam Headm  Result Date: 01/25/2017 CLINICAL DATA:  Stroke. Diabetes hyperlipidemia hypertension. Right-sided weakness and decreased sensation EXAM: MRI HEAD WITHOUT CONTRAST MRA HEAD WITHOUT CONTRAST TECHNIQUE: Multiplanar, multiecho pulse sequences of the brain and surrounding structures were obtained without intravenous contrast. Angiographic images of the head were obtained using MRA technique without contrast. COMPARISON:  CT head 01/25/2017, MRI head 07/19/2016 FINDINGS: MRI HEAD FINDINGS Brain: Acute infarct left lateral thalamus hiatal with possible involvement of some internal capsule fibers. This measures 1 cm. No other acute infarct Chronic infarct right cerebellum unchanged. Chronic microvascular ischemia in the white matter and pons of a mild degree. Negative for hemorrhage or mass. Ventricle size normal. Vascular: Normal arterial flow voids. Skull and upper cervical spine: Negative Sinuses/Orbits: Negative sinuses.  Bilateral cataract removal Other: None MRA HEAD FINDINGS Both vertebral arteries are patent to the basilar. Basilar widely patent. Moderate stenosis mid left posterior cerebral artery. Right posterior cerebral artery widely  patent without stenosis. Internal carotid artery widely patent bilaterally. Anterior and middle cerebral arteries patent without stenosis. Negative for cerebral aneurysm. IMPRESSION: 1 cm acute infarct left lateral thalamus and internal capsule Chronic infarct right cerebellum Moderate stenosis left posterior cerebral artery. No other significant intracranial stenosis. Electronically Signed   By: Franchot Gallo M.D.   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 Does the need for close, 24 hr/day medical supervision in concert with the patient's rehab needs make it unreasonable for this patient to be served in a less intensive setting? Yes 2. Co-Morbidities requiring supervision/potential complications: Hypertension, peripheral vascular disease 3. Due to bladder management, bowel management, safety, skin/wound care, disease management, medication administration, pain management and patient education, does the patient require 24 hr/day rehab nursing? Yes 4. Does the patient require coordinated care of a physician, rehab nurse, PT (1-2 hrs/day, 5 days/week) and OT (1-2 hrs/day, 5 days/week) to address physical and functional deficits in the context of the above medical diagnosis(es)? Yes Addressing deficits in the following areas: balance, endurance, locomotion, strength, transferring, bowel/bladder control, bathing, dressing, feeding, grooming and toileting 5. Can the patient actively participate in an intensive therapy program of at least 3 hrs of therapy per day at least 5 days per week? Yes 6. The potential for patient to make measurable gains while on inpatient rehab is good 7. Anticipated functional outcomes upon discharge from inpatient rehab are modified independent and supervision  with PT, modified independent and supervision with OT, n/a with SLP. 8. Estimated rehab length of stay to reach the above functional goals is:  7d 9. Does the patient have adequate social supports and living environment to accommodate these discharge functional goals? Yes 10. Anticipated D/C setting: Home 11. Anticipated post D/C treatments: HH therapy  12. Overall Rehab/Functional Prognosis: excellent  RECOMMENDATIONS: This patient's condition is appropriate for continued rehabilitative care in the following setting: CIR Patient has agreed to participate in recommended program. Yes Note that insurance prior authorization may be required for reimbursement for recommended care.  Comment:   Charlett Blake M.D. [employer] FAAPM&R (Sports Med, Neuromuscular Med) Diplomate Am Board of Electrodiagnostic Med  Cathlyn Parsons., PA-C 01/26/2017

## 2017-01-26 NOTE — Progress Notes (Signed)
PT Cancellation Note  Patient Details Name: MIKAHLA WISOR MRN: [id number] DOB: Jun 02, 1946   Cancelled Treatment:    Reason Eval/Treat Not Completed: Patient at procedure or test/unavailable. Pt off unit for testing. Will check back as schedule allows to continue with PT POC.    Thelma Comp 01/26/2017, 2:19 PM   Rolinda Roan, PT, DPT Acute Rehabilitation Services Pager: [phone]

## 2017-01-26 NOTE — Progress Notes (Signed)
[facility] PROGRESS NOTE  [name] HWE:[id number] DOB: 1946-02-11 DOA: 01/25/2017   PCP: [name], MD     Assessment/Plan: Active Problems:   Essential hypertension   CEREBRAL ANEURYSM   Ischemic stroke ([place])   Diabetes mellitus with complication [facility])   Depression with anxiety   71 y.o. right handed female with history of right brain aneurysm 2013 with no surgical intervention, CVA 2011, hypertension, hyperlipidemia, PVD with stenting. Per chart review patient lives with spouse was independent prior to admission and still working part time. Presented 01/25/2017 with right-sided weakness. MRI showed a 1 cm acute infarct left lateral thalamus and internal capsule. Chronic infarct right cerebellum. MRA with moderate stenosis left posterior cerebral artery. No other significant intracranial stenosis. Patient did not receive TPA  Assessment and plan Stroke:   Small L lateral thalamic infarct secondary to small vessel disease source  Resultant  Left face and left hand numbness  CT head no acute abnormality   MRI  small left lateral thalamus and internal capsule infarct. Old right cerebellar infarct  MRA  moderate stenosis L PCA  Carotid Doppler  pending  2D Echo  pending  LDL 92  HgbA1c pending, 10.9 in Feb  SCDs /Lovenox  Diet Heart Room service appropriate? Yes; Fluid consistency: Thin  aspirin 325 mg daily prior to admission, now on aspirin 325 mg daily. Pt has plavix allergy, will continue ASA on discharge.   Patient counseled to be compliant with her antithrombotic medications  Ongoing aggressive stroke risk factor management  Therapy recommendations:  CIR   HTN: allow permissive HTN as above (slight decrease in typical range due to aneurysm) - hold Norvasc, losartan - Hydralazine prn  DM: - continue 70/30, increase AM dose to 15 units in the morning - SSI 10.9 in Feb, goal < 7.0  Depression/Anxiety: - continue Klonopin and  celexa  Right ICA siphon 25mm aneurysm - outpt follow up needed   PVD s/p fem-pop, stent, followed by VVS  DVT prophylaxsis Lovenox   Code Status:  Full code   Family Communication: Discussed in detail with the patient, all imaging results, lab results explained to the patient   Disposition Plan:  1-2 days, CIR      Consultants:  Inpatient rehabilitation  Neurology  Procedures:  None  Antibiotics: Anti-infectives    None         HPI/Subjective: She still has facial numbness ,pocketing food , would like to go home   Objective: Vitals:   01/25/17 2315 01/26/17 0111 01/26/17 0433 01/26/17 0919  BP: (!) 166/64 (!) 164/56 (!) 180/63 (!) 182/56  Pulse: 63 65 71 70  Resp: 18 18 20 16   Temp: 98.2 F (36.8 C) 98.3 F (36.8 C) 97.8 F (36.6 C) 97.9 F (36.6 C)  TempSrc: Oral Oral Oral Oral  SpO2: 96% 93% 91% 93%  Weight:      Height:        Intake/Output Summary (Last 24 hours) at 01/26/17 1133 Last data filed at 01/26/17 1000  Gross per 24 hour  Intake              840 ml  Output              300 ml  Net              540 ml    Exam:  Examination:  General exam: Appears calm and comfortable  Respiratory system: Clear to auscultation. Respiratory effort normal.  Cardiovascular system: S1 & S2 heard, RRR. No JVD, murmurs, rubs, gallops or clicks. No pedal edema. Gastrointestinal system: Abdomen is nondistended, soft and nontender. No organomegaly or masses felt. Normal bowel sounds heard. Central nervous system: Alert and oriented. No focal neurological deficits. Extremities: Symmetric 5 x 5 power. Skin: No rashes, lesions or ulcers Psychiatry: Judgement and insight appear normal. Mood & affect appropriate.     Data Reviewed: I have personally reviewed following labs and imaging studies  Micro Results No results found for this or any previous visit (from the past 240 hour(s)).  Radiology Reports Ct Head Wo Contrast  Result Date:  01/25/2017 CLINICAL DATA:  Right side weakness. EXAM: CT HEAD WITHOUT CONTRAST TECHNIQUE: Contiguous axial images were obtained from the base of the skull through the vertex without intravenous contrast. COMPARISON:  MRI 07/19/2016 FINDINGS: Brain: No acute intracranial abnormality. Specifically, no hemorrhage, hydrocephalus, mass lesion, acute infarction, or significant intracranial injury. Vascular: No hyperdense vessel or unexpected calcification. Skull: No acute calvarial abnormality. Sinuses/Orbits: Visualized paranasal sinuses and mastoids clear. Orbital soft tissues unremarkable. Other: None IMPRESSION: No acute intracranial abnormality. Electronically Signed   By: Rolm Baptise M.D.   On: 01/25/2017 08:45   Mr Brain Wo Contrast  Result Date: 01/25/2017 CLINICAL DATA:  Stroke. Diabetes hyperlipidemia hypertension. Right-sided weakness and decreased sensation EXAM: MRI HEAD WITHOUT CONTRAST MRA HEAD WITHOUT CONTRAST TECHNIQUE: Multiplanar, multiecho pulse sequences of the brain and surrounding structures were obtained without intravenous contrast. Angiographic images of the head were obtained using MRA technique without contrast. COMPARISON:  CT head 01/25/2017, MRI head 07/19/2016 FINDINGS: MRI HEAD FINDINGS Brain: Acute infarct left lateral thalamus hiatal with possible involvement of some internal capsule fibers. This measures 1 cm. No other acute infarct Chronic infarct right cerebellum unchanged. Chronic microvascular ischemia in the white matter and pons of a mild degree. Negative for hemorrhage or mass. Ventricle size normal. Vascular: Normal arterial flow voids. Skull and upper cervical spine: Negative Sinuses/Orbits: Negative sinuses.  Bilateral cataract removal Other: None MRA HEAD FINDINGS Both vertebral arteries are patent to the basilar. Basilar widely patent. Moderate stenosis mid left posterior cerebral artery. Right posterior cerebral artery widely patent without stenosis. Internal carotid  artery widely patent bilaterally. Anterior and middle cerebral arteries patent without stenosis. Negative for cerebral aneurysm. IMPRESSION: 1 cm acute infarct left lateral thalamus and internal capsule Chronic infarct right cerebellum Moderate stenosis left posterior cerebral artery. No other significant intracranial stenosis. Electronically Signed   By: Franchot Gallo M.D.   On: 01/25/2017 15:55   Mr Jodene Nam Headm  Result Date: 01/25/2017 CLINICAL DATA:  Stroke. Diabetes hyperlipidemia hypertension. Right-sided weakness and decreased sensation EXAM: MRI HEAD WITHOUT CONTRAST MRA HEAD WITHOUT CONTRAST TECHNIQUE: Multiplanar, multiecho pulse sequences of the brain and surrounding structures were obtained without intravenous contrast. Angiographic images of the head were obtained using MRA technique without contrast. COMPARISON:  CT head 01/25/2017, MRI head 07/19/2016 FINDINGS: MRI HEAD FINDINGS Brain: Acute infarct left lateral thalamus hiatal with possible involvement of some internal capsule fibers. This measures 1 cm. No other acute infarct Chronic infarct right cerebellum unchanged. Chronic microvascular ischemia in the white matter and pons of a mild degree. Negative for hemorrhage or mass. Ventricle size normal. Vascular: Normal arterial flow voids. Skull and upper cervical spine: Negative Sinuses/Orbits: Negative sinuses.  Bilateral cataract removal Other: None MRA HEAD FINDINGS Both vertebral arteries are patent to the basilar. Basilar widely patent. Moderate stenosis mid left posterior cerebral artery. Right posterior cerebral artery widely patent without  stenosis. Internal carotid artery widely patent bilaterally. Anterior and middle cerebral arteries patent without stenosis. Negative for cerebral aneurysm. IMPRESSION: 1 cm acute infarct left lateral thalamus and internal capsule Chronic infarct right cerebellum Moderate stenosis left posterior cerebral artery. No other significant intracranial stenosis.  Electronically Signed   By: Franchot Gallo M.D.   On: 01/25/2017 15:55     CBC  Recent Labs Lab 01/25/17 0816 01/25/17 0836 01/26/17 0440  WBC 10.1  --  9.6  HGB 14.6 15.3* 14.1  HCT 43.9 45.0 42.8  PLT 313  --  272  MCV 94.2  --  93.9  MCH 31.3  --  30.9  MCHC 33.3  --  32.9  RDW 12.1  --  12.1  LYMPHSABS 2.3  --   --   MONOABS 0.7  --   --   EOSABS 0.1  --   --   BASOSABS 0.1  --   --     Chemistries   Recent Labs Lab 01/25/17 0816 01/25/17 0836 01/26/17 0440  NA 137 139 138  K 3.9 4.1 4.4  CL 104 103 100*  CO2 24  --  29  GLUCOSE 234* 234* 258*  BUN 19 24* 16  CREATININE 1.08* 1.00 1.14*  CALCIUM 9.0  --  8.8*  AST 20  --   --   ALT 13*  --   --   ALKPHOS 101  --   --   BILITOT 0.8  --   --    ------------------------------------------------------------------------------------------------------------------ estimated creatinine clearance is 48.9 mL/min (A) (by C-G formula based on SCr of 1.14 mg/dL (H)). ------------------------------------------------------------------------------------------------------------------ No results for input(s): HGBA1C in the last 72 hours. ------------------------------------------------------------------------------------------------------------------  Recent Labs  01/26/17 0440  CHOL 156  HDL 35*  LDLCALC 92  TRIG 144  CHOLHDL 4.5   ------------------------------------------------------------------------------------------------------------------ No results for input(s): TSH, T4TOTAL, T3FREE, THYROIDAB in the last 72 hours.  Invalid input(s): FREET3 ------------------------------------------------------------------------------------------------------------------ No results for input(s): VITAMINB12, FOLATE, FERRITIN, TIBC, IRON, RETICCTPCT in the last 72 hours.  Coagulation profile  Recent Labs Lab 01/25/17 0816  INR 0.87    No results for input(s): DDIMER in the last 72 hours.  Cardiac Enzymes No results  for input(s): CKMB, TROPONINI, MYOGLOBIN in the last 168 hours.  Invalid input(s): CK ------------------------------------------------------------------------------------------------------------------ Invalid input(s): POCBNP   CBG:  Recent Labs Lab 01/25/17 0924 01/25/17 1211 01/25/17 1644 01/25/17 2117 01/26/17 0614  GLUCAP 234* 211* 167* 266* 259*       Studies: Ct Head Wo Contrast  Result Date: 01/25/2017 CLINICAL DATA:  Right side weakness. EXAM: CT HEAD WITHOUT CONTRAST TECHNIQUE: Contiguous axial images were obtained from the base of the skull through the vertex without intravenous contrast. COMPARISON:  MRI 07/19/2016 FINDINGS: Brain: No acute intracranial abnormality. Specifically, no hemorrhage, hydrocephalus, mass lesion, acute infarction, or significant intracranial injury. Vascular: No hyperdense vessel or unexpected calcification. Skull: No acute calvarial abnormality. Sinuses/Orbits: Visualized paranasal sinuses and mastoids clear. Orbital soft tissues unremarkable. Other: None IMPRESSION: No acute intracranial abnormality. Electronically Signed   By: Rolm Baptise M.D.   On: 01/25/2017 08:45   Mr Brain Wo Contrast  Result Date: 01/25/2017 CLINICAL DATA:  Stroke. Diabetes hyperlipidemia hypertension. Right-sided weakness and decreased sensation EXAM: MRI HEAD WITHOUT CONTRAST MRA HEAD WITHOUT CONTRAST TECHNIQUE: Multiplanar, multiecho pulse sequences of the brain and surrounding structures were obtained without intravenous contrast. Angiographic images of the head were obtained using MRA technique without contrast. COMPARISON:  CT head 01/25/2017, MRI head 07/19/2016 FINDINGS: MRI HEAD  FINDINGS Brain: Acute infarct left lateral thalamus hiatal with possible involvement of some internal capsule fibers. This measures 1 cm. No other acute infarct Chronic infarct right cerebellum unchanged. Chronic microvascular ischemia in the white matter and pons of a mild degree. Negative for  hemorrhage or mass. Ventricle size normal. Vascular: Normal arterial flow voids. Skull and upper cervical spine: Negative Sinuses/Orbits: Negative sinuses.  Bilateral cataract removal Other: None MRA HEAD FINDINGS Both vertebral arteries are patent to the basilar. Basilar widely patent. Moderate stenosis mid left posterior cerebral artery. Right posterior cerebral artery widely patent without stenosis. Internal carotid artery widely patent bilaterally. Anterior and middle cerebral arteries patent without stenosis. Negative for cerebral aneurysm. IMPRESSION: 1 cm acute infarct left lateral thalamus and internal capsule Chronic infarct right cerebellum Moderate stenosis left posterior cerebral artery. No other significant intracranial stenosis. Electronically Signed   By: Franchot Gallo M.D.   On: 01/25/2017 15:55   Mr Jodene Nam Headm  Result Date: 01/25/2017 CLINICAL DATA:  Stroke. Diabetes hyperlipidemia hypertension. Right-sided weakness and decreased sensation EXAM: MRI HEAD WITHOUT CONTRAST MRA HEAD WITHOUT CONTRAST TECHNIQUE: Multiplanar, multiecho pulse sequences of the brain and surrounding structures were obtained without intravenous contrast. Angiographic images of the head were obtained using MRA technique without contrast. COMPARISON:  CT head 01/25/2017, MRI head 07/19/2016 FINDINGS: MRI HEAD FINDINGS Brain: Acute infarct left lateral thalamus hiatal with possible involvement of some internal capsule fibers. This measures 1 cm. No other acute infarct Chronic infarct right cerebellum unchanged. Chronic microvascular ischemia in the white matter and pons of a mild degree. Negative for hemorrhage or mass. Ventricle size normal. Vascular: Normal arterial flow voids. Skull and upper cervical spine: Negative Sinuses/Orbits: Negative sinuses.  Bilateral cataract removal Other: None MRA HEAD FINDINGS Both vertebral arteries are patent to the basilar. Basilar widely patent. Moderate stenosis mid left posterior  cerebral artery. Right posterior cerebral artery widely patent without stenosis. Internal carotid artery widely patent bilaterally. Anterior and middle cerebral arteries patent without stenosis. Negative for cerebral aneurysm. IMPRESSION: 1 cm acute infarct left lateral thalamus and internal capsule Chronic infarct right cerebellum Moderate stenosis left posterior cerebral artery. No other significant intracranial stenosis. Electronically Signed   By: Franchot Gallo M.D.   On: 01/25/2017 15:55      Lab Results  Component Value Date   HGBA1C 10.9 (H) 11/10/2016   HGBA1C 9.3 (H) 07/22/2016   HGBA1C 9.5 (H) 02/02/2016   Lab Results  Component Value Date   MICROALBUR 75.3 07/22/2016   LDLCALC 92 01/26/2017   CREATININE 1.14 (H) 01/26/2017       Scheduled Meds: . aspirin  300 mg Rectal Daily   Or  . aspirin  325 mg Oral Daily  . citalopram  40 mg Oral Daily  . clonazePAM  0.5 mg Oral BID  . insulin aspart  0-5 Units Subcutaneous QHS  . insulin aspart  0-9 Units Subcutaneous TID WC  . insulin aspart protamine- aspart  12 Units Subcutaneous QAC breakfast  . insulin aspart protamine- aspart  20 Units Subcutaneous Q supper  . magnesium oxide  400 mg Oral Daily  . omega-3 acid ethyl esters  1 g Oral Daily  . pantoprazole  80 mg Oral Daily  . simvastatin  20 mg Oral q1800  . sodium chloride flush  3 mL Intravenous Q12H   Continuous Infusions: . sodium chloride    . sodium chloride       LOS: 1 day    Time spent: >30 MINS  Reyne Dumas  [facility] Pager [phone]. If 7PM-7AM, please contact night-coverage at [email or web address], password [facility] 01/26/2017, 11:33 AM  LOS: 1 day

## 2017-01-26 NOTE — Progress Notes (Addendum)
Occupational Therapy Treatment Patient Details Name: [name] MRN: [id number] DOB: 27-Oct-1945 Today's Date: 01/26/2017    History of present illness Pt is a 71 y/o female who presents with R sided weakness and numbness.  MRI acute infarct left lateral thalamus and internal capsule.    OT comments  Pt progressing towards goals; continues to report numbness in RUE, however demonstrates use of RUE given increased time during ADL task completion. Pt completed room level functional mobility without device and with HHA/MinGuard assist this session, continues to demonstrate some unsteadiness and slower mobility, however improvements noted from yesterday's session. Completed seated and standing ADLs at MinA-MinGuardAssist. Pt will continue to benefit from OT services to increase safety and independence with ADLs and functional mobility. Continue per POC.    Follow Up Recommendations  CIR    Equipment Recommendations  3 in 1 bedside commode    Recommendations for Other Services Rehab consult    Precautions / Restrictions Precautions Precautions: Fall Restrictions Weight Bearing Restrictions: No       Mobility Bed Mobility               General bed mobility comments: Pt sitting in recliner at start of session  Transfers Overall transfer level: Needs assistance Equipment used: None Transfers: Sit to/from Stand Sit to Stand: Min guard         General transfer comment: Min guard for safety.     Balance Overall balance assessment: Needs assistance Sitting-balance support: Feet supported;No upper extremity supported Sitting balance-Leahy Scale: Good Sitting balance - Comments:    Standing balance support: During functional activity;No upper extremity supported Standing balance-Leahy Scale: Fair Standing balance comment: Reuqires Min guard-Min A for balance during dynamic tasks.              High level balance activites: High Level Balance Comments:            ADL either performed or assessed with clinical judgement   ADL Overall ADL's : Needs assistance/impaired Eating/Feeding: Set up Eating/Feeding Details (indicate cue type and reason): Pt reports attempts to feed self with R dominant hand today, reporting she finds that she over/undershoots when bring RUE to her mouth.      Upper Body Bathing: Min guard;Sitting   Lower Body Bathing: Min guard;Sit to/from stand   Upper Body Dressing : Min guard;Sitting   Lower Body Dressing: Minimal assistance;Sit to/from stand Lower Body Dressing Details (indicate cue type and reason): donning mesh briefs, doffing/donning socks Toilet Transfer: Minimal assistance;Ambulation;BSC Toilet Transfer Details (indicate cue type and reason): BSC over toilet  Toileting- Clothing Manipulation and Hygiene: Min guard;Sit to/from stand       Functional mobility during ADLs: Min guard;Minimal assistance General ADL Comments: Pt continues to report difficulty using R dominant UE, able to demonstrate use of RUE during functional tasks with increased time; Pt completed room level functional mobility, toilet transfer, toileting, seated sponge bath, and LB dressing                         Cognition Arousal/Alertness: Awake/alert Behavior During Therapy: WFL for tasks assessed/performed;Anxious Overall Cognitive Status: Within Functional Limits for tasks assessed                                 General Comments:  General Comments Spouse present during session.     Pertinent Vitals/ Pain       Pain Assessment: No/denies pain  Home Living                                          Prior Functioning/Environment              Frequency  Min 2X/week        Progress Toward Goals  OT Goals(current goals can now be found in the care plan section)  Progress towards OT goals: Progressing toward goals  Acute Rehab OT Goals Patient Stated  Goal: to get better OT Goal Formulation: With patient Time For Goal Achievement: 02/08/17 Potential to Achieve Goals: Good  Plan Discharge plan remains appropriate                     AM-PAC PT "6 Clicks" Daily Activity     Outcome Measure   Help from another person eating meals?: A Little Help from another person taking care of personal grooming?: A Little Help from another person toileting, which includes using toliet, bedpan, or urinal?: A Little Help from another person bathing (including washing, rinsing, drying)?: A Little Help from another person to put on and taking off regular upper body clothing?: A Little Help from another person to put on and taking off regular lower body clothing?: A Little 6 Click Score: 18    End of Session Equipment Utilized During Treatment: Gait belt  OT Visit Diagnosis: Unsteadiness on feet (R26.81);Other abnormalities of gait and mobility (R26.89);Ataxia, unspecified (R27.0)   Activity Tolerance Patient tolerated treatment well   Patient Left in chair;with call bell/phone within reach;with chair alarm set   Nurse Communication Mobility status        Time: 2505-3976 OT Time Calculation (min): 34 min  Charges: OT General Charges $OT Visit: 1 Procedure OT Treatments $Self Care/Home Management : 23-37 mins  Lou Cal, OT Pager [phone] 01/26/2017    Raymondo Band 01/26/2017, 4:31 PM

## 2017-01-27 ENCOUNTER — Inpatient Hospital Stay (HOSPITAL_COMMUNITY): Payer: Medicare Other

## 2017-01-27 DIAGNOSIS — I63332 Cerebral infarction due to thrombosis of left posterior cerebral artery: Secondary | ICD-10-CM

## 2017-01-27 LAB — COMPREHENSIVE METABOLIC PANEL
ALT: 14 U/L (ref 14–54)
AST: 22 U/L (ref 15–41)
Albumin: 3 g/dL — ABNORMAL LOW (ref 3.5–5.0)
Alkaline Phosphatase: 92 U/L (ref 38–126)
Anion gap: 6 (ref 5–15)
BILIRUBIN TOTAL: 0.6 mg/dL (ref 0.3–1.2)
BUN: 15 mg/dL (ref 6–20)
CALCIUM: 8.9 mg/dL (ref 8.9–10.3)
CO2: 29 mmol/L (ref 22–32)
CREATININE: 1.08 mg/dL — AB (ref 0.44–1.00)
Chloride: 101 mmol/L (ref 101–111)
GFR calc Af Amer: 59 mL/min — ABNORMAL LOW (ref >60)
GFR, EST NON AFRICAN AMERICAN: 51 mL/min — AB (ref >60)
Glucose, Bld: 249 mg/dL — ABNORMAL HIGH (ref 65–99)
Potassium: 4.2 mmol/L (ref 3.5–5.1)
Sodium: 136 mmol/L (ref 135–145)
TOTAL PROTEIN: 6.2 g/dL — AB (ref 6.5–8.1)

## 2017-01-27 LAB — GLUCOSE, CAPILLARY
GLUCOSE-CAPILLARY: 188 mg/dL — AB (ref 65–99)
GLUCOSE-CAPILLARY: 227 mg/dL — AB (ref 65–99)

## 2017-01-27 LAB — CBC
HCT: 43.6 % (ref 36.0–46.0)
Hemoglobin: 14.4 g/dL (ref 12.0–15.0)
MCH: 31.1 pg (ref 26.0–34.0)
MCHC: 33 g/dL (ref 30.0–36.0)
MCV: 94.2 fL (ref 78.0–100.0)
Platelets: 283 10*3/uL (ref 150–400)
RBC: 4.63 MIL/uL (ref 3.87–5.11)
RDW: 12.1 % (ref 11.5–15.5)
WBC: 9.7 10*3/uL (ref 4.0–10.5)

## 2017-01-27 LAB — LIPASE, BLOOD: LIPASE: 28 U/L (ref 11–51)

## 2017-01-27 MED ORDER — LOSARTAN POTASSIUM 100 MG PO TABS: 100.0000 mg | ORAL_TABLET | Freq: Every day | ORAL | 2 refills | Status: DC

## 2017-01-27 MED ORDER — IOPAMIDOL (ISOVUE-370) INJECTION 76%
INTRAVENOUS | Status: AC
  Administered 2017-01-27: 50 mL
  Filled 2017-01-27: qty 50

## 2017-01-27 NOTE — Progress Notes (Signed)
Inpatient Rehabilitation  I have a bed to offer patient today and await medical clearance prior to hopeful admission.  I have text paged MD and will update team as I know.  Please call with questions.   Carmelia Roller., CCC/SLP Admission Coordinator  [facility]  Cell [phone]

## 2017-01-27 NOTE — Progress Notes (Signed)
Discharge instructions reviewed with patient. All questions answered at this time. Transport home by family.   Ave Filter, RN

## 2017-01-27 NOTE — Progress Notes (Signed)
STROKE TEAM PROGRESS NOTE   SUBJECTIVE (INTERVAL HISTORY) Husband at bedside. Pt stated that this am, she was brushing her teeth and felt nausea. Received zofran and much improved. Her CTA neck showed only 30% stenosis b/l ICA and 50% left CCA. No need intervention at this time.    OBJECTIVE Temp:  [97.7 F (36.5 C)-99 F (37.2 C)] 98.5 F (36.9 C) (05/10 1338) Pulse Rate:  [57-67] 65 (05/10 1338) Cardiac Rhythm: Normal sinus rhythm (05/10 0703) Resp:  [18] 18 (05/10 1338) BP: (115-185)/(55-94) 140/55 (05/10 1338) SpO2:  [94 %-98 %] 98 % (05/10 1338)  CBC:  Recent Labs Lab 01/25/17 0816  01/26/17 0440 01/27/17 1106  WBC 10.1  --  9.6 9.7  NEUTROABS 7.0  --   --   --   HGB 14.6  < > 14.1 14.4  HCT 43.9  < > 42.8 43.6  MCV 94.2  --  93.9 94.2  PLT 313  --  272 283  < > = values in this interval not displayed.  Basic Metabolic Panel:   Recent Labs Lab 01/26/17 0440 01/27/17 1106  NA 138 136  K 4.4 4.2  CL 100* 101  CO2 29 29  GLUCOSE 258* 249*  BUN 16 15  CREATININE 1.14* 1.08*  CALCIUM 8.8* 8.9    Lipid Panel:     Component Value Date/Time   CHOL 156 01/26/2017 0440   TRIG 144 01/26/2017 0440   HDL 35 (L) 01/26/2017 0440   CHOLHDL 4.5 01/26/2017 0440   VLDL 29 01/26/2017 0440   LDLCALC 92 01/26/2017 0440   HgbA1c:  Lab Results  Component Value Date   HGBA1C 10.9 (H) 11/10/2016   Urine Drug Screen: No results found for: LABOPIA, COCAINSCRNUR, LABBENZ, AMPHETMU, THCU, LABBARB  Alcohol Level     Component Value Date/Time   ETH <5 10/04/2014 1545    IMAGING I have personally reviewed the radiological images below and agree with the radiology interpretations.  Ct Head Wo Contrast 01/25/2017 No acute intracranial abnormality.   Mr Brain Wo Contrast 01/25/2017 1 cm acute infarct left lateral thalamus and internal capsule Chronic infarct right cerebellum   Mr Lovenia Kim 01/25/2017 Moderate stenosis left posterior cerebral artery. No other significant  intracranial stenosis.   CUS - Findings consistent with a 1- 39 percent stenosis involving the right internal carotid artery and the left internal carotid artery.  Left carotid evaluation could be underestimated due to moderate amount of calcified plaque.  Bilateral vertebral arteries are patent and antegrade.  TTE - Left ventricle: The cavity size was normal. Wall thickness was   normal. Systolic function was normal. The estimated ejection   fraction was in the range of 60% to 65%. Wall motion was normal;   there were no regional wall motion abnormalities. Doppler   parameters are consistent with abnormal left ventricular   relaxation (grade 1 diastolic dysfunction). Impressions: - Technically difficult echo   normal LV systolic function   grade 1 diastolic dysfunction  Ct Angio Neck W Or Wo Contrast 01/27/2017 IMPRESSION: 1. Right carotid bifurcation moderate calcified plaque with mild 30% proximal ICA stenosis. 2. Left common carotid artery artery origin and long segment of mid common carotid artery calcified plaque with up to moderate 50% stenosis. 3. Left carotid bifurcation moderate calcified plaque with mild 30% proximal ICA stenosis. 4. Bilateral vertebral artery multiple short segment calcified plaques with mild-to-moderate stenosis. 5. No evidence of dissection or occlusion of the carotid and vertebral arteries of the neck. Atherosclerotic  changes and levels of stenosis are stable from the prior CT angiogram of the neck. 6. Mild centrilobular emphysema of lung apices with scattered 2-3 mm pulmonary nodules. No follow-up needed if patient is low-risk (and has no known or suspected primary neoplasm). Non-contrast chest CT can be considered in 12 months if patient is high-risk. This recommendation follows the consensus statement: Guidelines for Management of Incidental Pulmonary Nodules Detected on CT Images: From the Fleischner Society 2017; Radiology 2017; 284:228-243.     PHYSICAL  EXAM  Temp:  [97.7 F (36.5 C)-99 F (37.2 C)] 98.5 F (36.9 C) (05/10 1338) Pulse Rate:  [57-67] 65 (05/10 1338) Resp:  [18] 18 (05/10 1338) BP: (115-185)/(55-94) 140/55 (05/10 1338) SpO2:  [94 %-98 %] 98 % (05/10 1338)  General - Well nourished, well developed, in no apparent distress.  Ophthalmologic - Sharp disc margins OU.   Cardiovascular - Regular rate and rhythm.  Mental Status -  Level of arousal and orientation to time, place, and person were intact. Language including expression, naming, repetition, comprehension was assessed and found intact. Attention span and concentration were normal. Fund of Knowledge was assessed and was intact.  Cranial Nerves II - XII - II - Visual field intact OU. III, IV, VI - Extraocular movements intact. V - right facial touch and pinprick sensation decreased, about 50% comparing with left. VII - Facial movement intact bilaterally. VIII - Hearing & vestibular intact bilaterally. X - Palate elevates symmetrically. XI - Chin turning & shoulder shrug intact bilaterally. XII - Tongue protrusion intact.  Motor Strength - The patient's strength was normal in all extremities except subtle right hand dexterity difficulty and pronator drift was absent.  Bulk was normal and fasciculations were absent.   Motor Tone - Muscle tone was assessed at the neck and appendages and was normal.  Reflexes - The patient's reflexes were 1+ in all extremities and she had no pathological reflexes.  Sensory - Light touch, temperature/pinprick were assessed and were symmetrical except decreased at right hand, 50% of the left.    Coordination - The patient had normal movements in the hands and feet with no ataxia or dysmetria.  Tremor was absent.  Gait and Station - deferred as pt is going to vascular lab   ASSESSMENT/PLAN [name] is a 71 y.o. female with history of brain aneurysm, diabetes, GERD, hiatal hernia, hyperlipidemia, hypertension,  migraines, PVD, TIA  presenting with Right facial droop with Right-sided weakness with decreased sensation. She did not receive IV t-PA due to being outside of the window.   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 Old right cerebellar infarct  MRA  moderate stenosis L PCA  Carotid Doppler unremarkable except left ICA plaque  CTA neck - b/l ICA 30% and left CCA 50% stenosis, b/l VA mild stenosis  2D Echo  EF 60-65%  LDL 92  HgbA1c pending, 10.9 in Feb  SCDs for VTE prophylaxis Diet Heart Room service appropriate? Yes; Fluid consistency: Thin Diet - low sodium heart healthy  aspirin 325 mg daily prior to admission, now on aspirin 325 mg daily. Pt has plavix allergy, will continue ASA on discharge.   Patient counseled to be compliant with her antithrombotic medications  Ongoing aggressive stroke risk factor management  Therapy recommendations:  CIR  Disposition:  pending   Hypertension  Stable  Permissive hypertension (OK if < 220/120) but gradually normalize in 5-7 days  Long-term BP goal normotensive  Hyperlipidemia  Home meds:  zocor 20 and fish oil, resumed in hospital  LDL 92, goal < 70  Continue statin at discharge  Diabetes  HgbA1c pending, 10.9 in Feb, goal < 7.0  Uncontrolled  Pt aware of hyperglycemia and plan to see endocrinologist   SSI  CBG monitoring  Tobacco abuse  Current smoker  Smoking cessation counseling provided  Pt is willing to quit  Other Stroke Risk Factors  Advanced age  Hx stroke/TIA - old right cerebellar infarct on MRI   Migraines  PVD s/p fem-pop, stent, followed by [facility]  Right ICA siphon 17mm aneurysm - outpt follow up with Dr. Catalina Gravel (Neurology)  Other Active Problems  Depression/anxiety on Klonopin and [facility] day #  2   Neurology will sign off. Please call with questions. Pt prefer to continue follow up with her neurologist Dr. Michel Santee as outpt. Thanks for the consult.  Rosalin Hawking, MD PhD Stroke Neurology 01/27/2017 3:35 PM   To contact Stroke Continuity provider, please refer to [email or web address] After hours, contact General Neurology

## 2017-01-27 NOTE — Care Management Note (Signed)
Case Management Note  Patient Details  Name: GIAVANA ROOKE MRN: [id number] Date of Birth: 29-Jun-1946  Subjective/Objective:                    Action/Plan: Pt has decided to d/c home. MD made aware and ordered [place] therapies. CM met with the patient and provided her a list of [facility] therapies. She selected [facility]. Santiago Glad with [facility] notified and accepted the referral.  Pt with orders for walker and 3 in 1. Santiago Glad with [facility] will deliver the equipment to the room.  Patient has transportation home.   Expected Discharge Date:  01/27/17               Expected Discharge Plan:  [place]  In-House Referral:     Discharge planning Services  CM Consult  Post Acute Care Choice:  Durable Medical Equipment, Home Health Choice offered to:  Patient  DME Arranged:  3-N-1, Walker rolling DME Agency:  [place] Arranged:  PT, OT, Nurse's Aide [place] Agency:  [place]  Status of Service:  Completed, signed off  If discussed at [place] of Stay Meetings, dates discussed:    Additional Comments:  Pollie Friar, RN 01/27/2017, 1:27 PM

## 2017-01-27 NOTE — Consult Note (Addendum)
           [facility] CM Primary Care Navigator  01/27/2017  [name] [birth year] [id number]   Wentto see patient today at the bedside to identify possible discharge needs but she was alreadydischarged per staff report.  . Patient was discharged home with home health services (over [place]) per RN report.  Primary care provider's office called ([name])to notify of patient's discharge and need for post hospitalfollow-up and transition of care. Notified also of patient's health issues needing follow-up.  Made aware to refer patient to [facility] care management asdeemed appropriatefor services.    For questions, please contact:  Dannielle Huh, BSN, RN- [facility] Primary Care Navigator  Telephone: [phone] [place]

## 2017-01-27 NOTE — Progress Notes (Signed)
Inpatient Rehabilitation  I received medical clearance; however, patient is declining bed offer.  PEr her report she discussed it with a MD and was told that she could go home and do just fine.  Notified RNCM.    Carmelia Roller., CCC/SLP Admission Coordinator  [place]  Cell [phone]

## 2017-01-27 NOTE — Progress Notes (Signed)
Physical Therapy Treatment Patient Details Name: [name] MRN: [id number] DOB: 17-Jan-1946 Today's Date: 01/27/2017    History of Present Illness Pt is a 71 y/o female who presents with R sided weakness and numbness.  MRI acute infarct left lateral thalamus and internal capsule.     PT Comments    Pt progressing toward PT goals this session, requiring min guard for transfers, ambulation and stair management. Today she completed dual task (cognition+motor) exercises and some DGI components while ambulating and she showed some slowness and slight deficits with balance, but required no physical assist to recover.  She was educated on safety awareness, RW use, and deficit awareness.  Current recommendations have been updated, as pt is safe to return home with 24 hour support from husband, as well as HHPT services.  Will continue to follow acutely.     Follow Up Recommendations  Home health PT;Supervision/Assistance - 24 hour     Equipment Recommendations  None recommended by PT (equipment received in room)    Recommendations for Other Services       Precautions / Restrictions Precautions Precautions: Fall Restrictions Weight Bearing Restrictions: No Other Position/Activity Restrictions: R sided weakness and numbness    Mobility  Bed Mobility               General bed mobility comments: Pt sitting in recliner at start of session  Transfers Overall transfer level: Needs assistance Equipment used: None Transfers: Sit to/from Stand Sit to Stand: Min guard         General transfer comment: Min guard for safety.  Ambulation/Gait Ambulation/Gait assistance: Min guard Ambulation Distance (Feet): 250 Feet Assistive device: None Gait Pattern/deviations: Step-through pattern;Scissoring;Narrow base of support Gait velocity: Decreased Gait velocity interpretation: Below normal speed for age/gender General Gait Details: Pt gait scissoring slightly initially with walking  and listening/talking.  Pt states she has LOB when distracted.  Pt cued to count backwards by 2's from 100 while ambulating and was able to with some error (~every 10th number missed or repeated).  Pt educated on using RW for distracting environments in order to maintain stability.   Stairs Stairs: Yes   Stair Management: One rail Right;Step to pattern Number of Stairs: 5 (x2) General stair comments: Pt educated on and cued for step to pattern.  No apparent LOB and pt able to complete stairs without cueing on second attempt.  Wheelchair Mobility    Modified Rankin (Stroke Patients Only) Modified Rankin (Stroke Patients Only) Pre-Morbid Rankin Score: No symptoms Modified Rankin: Moderately severe disability     Balance Overall balance assessment: Needs assistance Sitting-balance support: Feet supported;No upper extremity supported Sitting balance-Leahy Scale: Good     Standing balance support: No upper extremity supported Standing balance-Leahy Scale: Fair               High level balance activites: Head turns;Turns;Direction changes;Other (comment) (Stepping over objects while walking) High Level Balance Comments: Pt states she feels slightly off balance with head turns.  Pt slowed down before stepping over lines in hallway.  No apparent LOB noted.            Cognition Arousal/Alertness: Awake/alert Behavior During Therapy: WFL for tasks assessed/performed;Anxious Overall Cognitive Status: Impaired/Different from baseline Area of Impairment: Attention;Safety/judgement                   Current Attention Level: Alternating     Safety/Judgement: Decreased awareness of safety;Decreased awareness of deficits     General Comments: Distracted  by objects/people in hallway.  Distractions seem to decrease her balance.      Exercises Other Exercises Other Exercises: Pt cued to count back from 100 by 2's while ambulating    General Comments         Pertinent Vitals/Pain Pain Assessment: No/denies pain    Home Living                      Prior Function            PT Goals (current goals can now be found in the care plan section) Acute Rehab PT Goals Patient Stated Goal: to go home PT Goal Formulation: With patient Time For Goal Achievement: 02/01/17 Potential to Achieve Goals: Good Progress towards PT goals: Progressing toward goals    Frequency    Min 4X/week      PT Plan Discharge plan needs to be updated    Co-evaluation              AM-PAC PT "6 Clicks" Daily Activity  Outcome Measure  Difficulty turning over in bed (including adjusting bedclothes, sheets and blankets)?: None Difficulty moving from lying on back to sitting on the side of the bed? : None Difficulty sitting down on and standing up from a chair with arms (e.g., wheelchair, bedside commode, etc,.)?: Total Help needed moving to and from a bed to chair (including a wheelchair)?: A Little Help needed walking in hospital room?: A Little Help needed climbing 3-5 steps with a railing? : A Little 6 Click Score: 18    End of Session Equipment Utilized During Treatment: Gait belt Activity Tolerance: Patient tolerated treatment well Patient left: in chair;with call bell/phone within reach;with chair alarm set Nurse Communication: Mobility status PT Visit Diagnosis: Other symptoms and signs involving the nervous system (R29.898);Unsteadiness on feet (R26.81);Hemiplegia and hemiparesis Hemiplegia - Right/Left: Right Hemiplegia - caused by: Cerebral infarction     Time: 9373-4287 PT Time Calculation (min) (ACUTE ONLY): 23 min  Charges:                       G Codes:       Gaetano Net SPT  Gaetano Net 01/27/2017, 2:39 PM

## 2017-01-27 NOTE — Progress Notes (Signed)
Patient s/o new onset of nausea. antienemic given per order. Pt still c/o of nausea and unable to eat her breakfast. MD paged. Pt is chair at this time. Pt back to bed now.    Ave Filter, RN

## 2017-01-27 NOTE — Discharge Summary (Addendum)
Physician Discharge Summary  [name] MRN: [id number] DOB/AGE: Jun 02, 1946 71 y.o.  PCP: [name], MD   Admit date: 01/25/2017 Discharge date: 01/27/2017  Discharge Diagnoses:    Active Problems:   Essential hypertension   CEREBRAL ANEURYSM   Ischemic stroke (HCC)   Diabetes mellitus with complication ([place])   Depression with anxiety    Follow-up recommendations Follow-up with PCP in 3-5 days , including all  additional recommended appointments as below Follow-up CBC, CMP in 3-5 days Patient to follow-up with neurology,Xu, Jindong, MD in 4-6 weeks Patient would need outpatient neurosurgery evaluation for Right ICA siphon 54m aneurysm        Current Discharge Medication List    CONTINUE these medications which have CHANGED   Details  losartan (COZAAR) 100 MG tablet Take 1 tablet (100 mg total) by mouth daily. Qty: 90 tablet, Refills: 2      CONTINUE these medications which have NOT CHANGED   Details  amLODipine (NORVASC) 10 MG tablet Take 1 tablet (10 mg total) by mouth daily. Qty: 90 tablet, Refills: 2    aspirin 325 MG tablet Take 325 mg by mouth daily.     b complex vitamins capsule Take 1 capsule by mouth daily.    Cholecalciferol (VITAMIN D3) 5000 UNITS TABS Take 1 capsule by mouth daily.    CINNAMON PO Take 1,000 mg by mouth 2 (two) times daily.     citalopram (CELEXA) 40 MG tablet TAKE 1 TABLET DAILY FOR MOOD & ANXIETY Qty: 90 tablet, Refills: 1   Associated Diagnoses: Chronic anxiety    clonazePAM (KLONOPIN) 0.5 MG tablet TAKE 1 TABLET BY MOUTH 2 TIMES DAILY Qty: 180 tablet, Refills: 1   Associated Diagnoses: Anxiety state    fluticasone (FLONASE) 50 MCG/ACT nasal spray Place 2 sprays into both nostrils daily. Qty: 16 g, Refills: 0    glucose blood (ACCU-CHEK AVIVA PLUS) test strip Use daily to check BS TID Dx. E11.22 Qty: 300 each, Refills: 1    insulin NPH-regular Human (NOVOLIN 70/30) (70-30) 100 UNIT/ML injection Inject 25 units in  the AM [place] with food, and inject 25 units [place] with evening meal. Qty: 60 mL, Refills: 4    Insulin Syringe-Needle U-100 31G X 15/64" 0.5 ML MISC Use two pens daily with insulin Qty: 100 each, Refills: 3    IRON PO Take by mouth daily.    Magnesium 500 MG CAPS Take 1 capsule by mouth daily.    metFORMIN (GLUCOPHAGE-XR) 500 MG 24 hr tablet Take 2 tablets (1,000 mg total) by mouth 2 (two) times daily. Qty: 360 tablet, Refills: 1   Associated Diagnoses: Type 2 diabetes mellitus with stage 3 chronic kidney disease, without long-term current use of insulin (HCC)    Omega-3 Fatty Acids (FISH OIL) 1200 MG CAPS Take 1 capsule by mouth daily.    omeprazole (PRILOSEC) 40 MG capsule     simvastatin (ZOCOR) 20 MG tablet TAKE 1 TABLET (20 MG TOTAL) BY MOUTH AT BEDTIME. Qty: 90 tablet, Refills: 2    vitamin E (VITAMIN E) 400 UNIT capsule Take 400 Units by mouth daily.    Blood Glucose Monitoring Suppl (ACCU-CHEK AVIVA PLUS) W/DEVICE KIT Check blood sugar up to three times daily Qty: 1 kit, Refills: 0         Discharge Condition:Stable   Discharge Instructions Get Medicines reviewed and adjusted: Please take all your medications with you for your next visit with your Primary MD  Please request your Primary MD to go  over all hospital tests and procedure/radiological results at the follow up, please ask your Primary MD to get all Hospital records sent to his/her office.  If you experience worsening of your admission symptoms, develop shortness of breath, life threatening emergency, suicidal or homicidal thoughts you must seek medical attention immediately by calling 911 or calling your MD immediately if symptoms less severe.  You must read complete instructions/literature along with all the possible adverse reactions/side effects for all the Medicines you take and that have been prescribed to you. Take any new Medicines after you have completely understood and accpet all the possible adverse  reactions/side effects.   Do not drive when taking Pain medications.   Do not take more than prescribed Pain, Sleep and Anxiety Medications  Special Instructions: If you have smoked or chewed Tobacco in the last 2 yrs please stop smoking, stop any regular Alcohol and or any Recreational drug use.  Wear Seat belts while driving.  Please note  You were cared for by a hospitalist during your hospital stay. Once you are discharged, your primary care physician will handle any further medical issues. Please note that NO REFILLS for any discharge medications will be authorized once you are discharged, as it is imperative that you return to your primary care physician (or establish a relationship with a primary care physician if you do not have one) for your aftercare needs so that they can reassess your need for medications and monitor your lab values.     Allergies  Allergen Reactions  . Plavix [Clopidogrel Bisulfate] Palpitations  . Amoxicillin Itching and Swelling    FACE & EYES SWELL  . Ace Inhibitors   . Lyrica [Pregabalin]   . Penicillins Other (See Comments)    REACTION: unspecified      Disposition: CIR   Consults: * Neurology     Significant Diagnostic Studies:  Ct Head Wo Contrast  Result Date: 01/25/2017 CLINICAL DATA:  Right side weakness. EXAM: CT HEAD WITHOUT CONTRAST TECHNIQUE: Contiguous axial images were obtained from the base of the skull through the vertex without intravenous contrast. COMPARISON:  MRI 07/19/2016 FINDINGS: Brain: No acute intracranial abnormality. Specifically, no hemorrhage, hydrocephalus, mass lesion, acute infarction, or significant intracranial injury. Vascular: No hyperdense vessel or unexpected calcification. Skull: No acute calvarial abnormality. Sinuses/Orbits: Visualized paranasal sinuses and mastoids clear. Orbital soft tissues unremarkable. Other: None IMPRESSION: No acute intracranial abnormality. Electronically Signed   By: Rolm Baptise M.D.   On: 01/25/2017 08:45   Ct Angio Neck W Or Wo Contrast  Result Date: 01/27/2017 CLINICAL DATA:  71 y/o  F; abnormal left-sided ICA carotid Doppler. EXAM: CT ANGIOGRAPHY NECK TECHNIQUE: Multidetector CT imaging of the neck was performed using the standard protocol during bolus administration of intravenous contrast. Multiplanar CT image reconstructions and MIPs were obtained to evaluate the vascular anatomy. Carotid stenosis measurements (when applicable) are obtained utilizing NASCET criteria, using the distal internal carotid diameter as the denominator. CONTRAST:  50 cc Isovue 370 COMPARISON:  07/19/2016 CT angiogram of the neck. FINDINGS: Aortic arch: Mild calcific plaque of the aortic arch. Standard branching. No evidence for dissection or aneurysm. Right carotid system: No evidence of dissection, stenosis (50% or greater) or occlusion. Moderate calcified plaque of the right carotid bifurcation with 30% proximal ICA stenosis. Left carotid system: No evidence of dissection or occlusion. Calcified plaque of left common carotid artery origin with 50% stenosis. Extensive calcified plaque throughout the mid common carotid artery with up to 50% stenosis. Extensive calcified  plaque of the carotid bifurcation with 30% proximal ICA stenosis. Vertebral arteries: Left dominant. Multiple calcified plaques of the right V1 segment with mild-to-moderate stenosis. Calcified plaques of the left V2 segment with mild-to-moderate stenosis. Skeleton: Moderate cervical spondylosis with multilevel disc and facet degenerative changes. No high-grade bony canal stenosis. Other neck: Negative. Upper chest: Mild centrilobular emphysema of the lung apices. Scattered 2-3 mm pulmonary nodules. IMPRESSION: 1. Right carotid bifurcation moderate calcified plaque with mild 30% proximal ICA stenosis. 2. Left common carotid artery artery origin and long segment of mid common carotid artery calcified plaque with up to moderate 50%  stenosis. 3. Left carotid bifurcation moderate calcified plaque with mild 30% proximal ICA stenosis. 4. Bilateral vertebral artery multiple short segment calcified plaques with mild-to-moderate stenosis. 5. No evidence of dissection or occlusion of the carotid and vertebral arteries of the neck. Atherosclerotic changes and levels of stenosis are stable from the prior CT angiogram of the neck. 6. Mild centrilobular emphysema of lung apices with scattered 2-3 mm pulmonary nodules. No follow-up needed if patient is low-risk (and has no known or suspected primary neoplasm). Non-contrast chest CT can be considered in 12 months if patient is high-risk. This recommendation follows the consensus statement: Guidelines for Management of Incidental Pulmonary Nodules Detected on CT Images: From the Fleischner Society 2017; Radiology 2017; 284:228-243. Electronically Signed   By: Kristine Garbe M.D.   On: 01/27/2017 01:23   Mr Brain Wo Contrast  Result Date: 01/25/2017 CLINICAL DATA:  Stroke. Diabetes hyperlipidemia hypertension. Right-sided weakness and decreased sensation EXAM: MRI HEAD WITHOUT CONTRAST MRA HEAD WITHOUT CONTRAST TECHNIQUE: Multiplanar, multiecho pulse sequences of the brain and surrounding structures were obtained without intravenous contrast. Angiographic images of the head were obtained using MRA technique without contrast. COMPARISON:  CT head 01/25/2017, MRI head 07/19/2016 FINDINGS: MRI HEAD FINDINGS Brain: Acute infarct left lateral thalamus hiatal with possible involvement of some internal capsule fibers. This measures 1 cm. No other acute infarct Chronic infarct right cerebellum unchanged. Chronic microvascular ischemia in the white matter and pons of a mild degree. Negative for hemorrhage or mass. Ventricle size normal. Vascular: Normal arterial flow voids. Skull and upper cervical spine: Negative Sinuses/Orbits: Negative sinuses.  Bilateral cataract removal Other: None MRA HEAD FINDINGS  Both vertebral arteries are patent to the basilar. Basilar widely patent. Moderate stenosis mid left posterior cerebral artery. Right posterior cerebral artery widely patent without stenosis. Internal carotid artery widely patent bilaterally. Anterior and middle cerebral arteries patent without stenosis. Negative for cerebral aneurysm. IMPRESSION: 1 cm acute infarct left lateral thalamus and internal capsule Chronic infarct right cerebellum Moderate stenosis left posterior cerebral artery. No other significant intracranial stenosis. Electronically Signed   By: Franchot Gallo M.D.   On: 01/25/2017 15:55   Mr Jodene Nam Headm  Result Date: 01/25/2017 CLINICAL DATA:  Stroke. Diabetes hyperlipidemia hypertension. Right-sided weakness and decreased sensation EXAM: MRI HEAD WITHOUT CONTRAST MRA HEAD WITHOUT CONTRAST TECHNIQUE: Multiplanar, multiecho pulse sequences of the brain and surrounding structures were obtained without intravenous contrast. Angiographic images of the head were obtained using MRA technique without contrast. COMPARISON:  CT head 01/25/2017, MRI head 07/19/2016 FINDINGS: MRI HEAD FINDINGS Brain: Acute infarct left lateral thalamus hiatal with possible involvement of some internal capsule fibers. This measures 1 cm. No other acute infarct Chronic infarct right cerebellum unchanged. Chronic microvascular ischemia in the white matter and pons of a mild degree. Negative for hemorrhage or mass. Ventricle size normal. Vascular: Normal arterial flow voids. Skull and upper cervical spine:  Negative Sinuses/Orbits: Negative sinuses.  Bilateral cataract removal Other: None MRA HEAD FINDINGS Both vertebral arteries are patent to the basilar. Basilar widely patent. Moderate stenosis mid left posterior cerebral artery. Right posterior cerebral artery widely patent without stenosis. Internal carotid artery widely patent bilaterally. Anterior and middle cerebral arteries patent without stenosis. Negative for cerebral  aneurysm. IMPRESSION: 1 cm acute infarct left lateral thalamus and internal capsule Chronic infarct right cerebellum Moderate stenosis left posterior cerebral artery. No other significant intracranial stenosis. Electronically Signed   By: Franchot Gallo M.D.   On: 01/25/2017 15:55    echocardiogram  LV EF: 60% -   65%  ------------------------------------------------------------------- Indications:      CVA 436.  ------------------------------------------------------------------- History:   Risk factors:  Hypertension. Diabetes mellitus. Dyslipidemia.  ------------------------------------------------------------------- Study Conclusions  - Left ventricle: The cavity size was normal. Wall thickness was   normal. Systolic function was normal. The estimated ejection   fraction was in the range of 60% to 65%. Wall motion was normal;   there were no regional wall motion abnormalities. Doppler   parameters are consistent with abnormal left ventricular   relaxation (grade 1 diastolic dysfunction).  Impressions:  - Technically difficult echo   normal LV systolic function   grade 1 diastolic dysfunction      Filed Weights   01/25/17 0805  Weight: 79.8 kg (176 lb)     Microbiology: No results found for this or any previous visit (from the past 240 hour(s)).     Blood Culture No results found for: SDES, [place], CULT, REPTSTATUS    Labs: Results for orders placed or performed during the hospital encounter of 01/25/17 (from the past 48 hour(s))  CBG monitoring, ED     Status: Abnormal   Collection Time: 01/25/17  9:24 AM  Result Value Ref Range   Glucose-Capillary 234 (H) 65 - 99 mg/dL  Glucose, capillary     Status: Abnormal   Collection Time: 01/25/17 12:11 PM  Result Value Ref Range   Glucose-Capillary 211 (H) 65 - 99 mg/dL  Glucose, capillary     Status: Abnormal   Collection Time: 01/25/17  4:44 PM  Result Value Ref Range   Glucose-Capillary 167 (H) 65  - 99 mg/dL  Glucose, capillary     Status: Abnormal   Collection Time: 01/25/17  9:17 PM  Result Value Ref Range   Glucose-Capillary 266 (H) 65 - 99 mg/dL   Comment 1 Notify RN    Comment 2 Document in Chart   CBC     Status: None   Collection Time: 01/26/17  4:40 AM  Result Value Ref Range   WBC 9.6 4.0 - 10.5 K/uL   RBC 4.56 3.87 - 5.11 MIL/uL   Hemoglobin 14.1 12.0 - 15.0 g/dL   HCT 42.8 36.0 - 46.0 %   MCV 93.9 78.0 - 100.0 fL   MCH 30.9 26.0 - 34.0 pg   MCHC 32.9 30.0 - 36.0 g/dL   RDW 12.1 11.5 - 15.5 %   Platelets 272 150 - 400 K/uL  Basic metabolic panel     Status: Abnormal   Collection Time: 01/26/17  4:40 AM  Result Value Ref Range   Sodium 138 135 - 145 mmol/L   Potassium 4.4 3.5 - 5.1 mmol/L   Chloride 100 (L) 101 - 111 mmol/L   CO2 29 22 - 32 mmol/L   Glucose, Bld 258 (H) 65 - 99 mg/dL   BUN 16 6 - 20 mg/dL   Creatinine, Ser  1.14 (H) 0.44 - 1.00 mg/dL   Calcium 8.8 (L) 8.9 - 10.3 mg/dL   GFR calc non Af Amer 48 (L) >60 mL/min   GFR calc Af Amer 55 (L) >60 mL/min    Comment: (NOTE) The eGFR has been calculated using the CKD EPI equation. This calculation has not been validated in all clinical situations. eGFR's persistently <60 mL/min signify possible Chronic Kidney Disease.    Anion gap 9 5 - 15  Lipid panel     Status: Abnormal   Collection Time: 01/26/17  4:40 AM  Result Value Ref Range   Cholesterol 156 0 - 200 mg/dL   Triglycerides 144 <150 mg/dL   HDL 35 (L) >40 mg/dL   Total CHOL/HDL Ratio 4.5 RATIO   VLDL 29 0 - 40 mg/dL   LDL Cholesterol 92 0 - 99 mg/dL    Comment:        Total Cholesterol/HDL:CHD Risk Coronary Heart Disease Risk Table                     Men   Women  1/2 Average Risk   3.4   3.3  Average Risk       5.0   4.4  2 X Average Risk   9.6   7.1  3 X Average Risk  23.4   11.0        Use the calculated Patient Ratio above and the CHD Risk Table to determine the patient's CHD Risk.        ATP III CLASSIFICATION (LDL):  <100      mg/dL   Optimal  100-129  mg/dL   Near or Above                    Optimal  130-159  mg/dL   Borderline  160-189  mg/dL   High  >190     mg/dL   Very High   Glucose, capillary     Status: Abnormal   Collection Time: 01/26/17  6:14 AM  Result Value Ref Range   Glucose-Capillary 259 (H) 65 - 99 mg/dL   Comment 1 Notify RN    Comment 2 Document in Chart   Glucose, capillary     Status: Abnormal   Collection Time: 01/26/17 12:22 PM  Result Value Ref Range   Glucose-Capillary 213 (H) 65 - 99 mg/dL   Comment 1 Notify RN   Glucose, capillary     Status: Abnormal   Collection Time: 01/26/17  4:33 PM  Result Value Ref Range   Glucose-Capillary 227 (H) 65 - 99 mg/dL  Glucose, capillary     Status: Abnormal   Collection Time: 01/26/17  9:31 PM  Result Value Ref Range   Glucose-Capillary 219 (H) 65 - 99 mg/dL   Comment 1 Notify RN    Comment 2 Document in Chart   Glucose, capillary     Status: Abnormal   Collection Time: 01/27/17  6:05 AM  Result Value Ref Range   Glucose-Capillary 188 (H) 65 - 99 mg/dL   Comment 1 Notify RN    Comment 2 Document in Chart      Lipid Panel     Component Value Date/Time   CHOL 156 01/26/2017 0440   TRIG 144 01/26/2017 0440   HDL 35 (L) 01/26/2017 0440   CHOLHDL 4.5 01/26/2017 0440   VLDL 29 01/26/2017 0440   LDLCALC 92 01/26/2017 0440   LDLDIRECT 146.4 01/30/2010 1056  Lab Results  Component Value Date   HGBA1C 10.9 (H) 11/10/2016   HGBA1C 9.3 (H) 07/22/2016   HGBA1C 9.5 (H) 02/02/2016       HPI :   71 y.o.right handed femalewith history of right brain aneurysm 2013with no surgical intervention, CVA 2011, hypertension, hyperlipidemia, PVD with stenting. Per chart review patient lives with spouse was independent prior to [place] still working part time. Presented 01/25/2017 with right-sided weakness. MRI showed a 1 cm acute infarct left lateral thalamus and internal capsule. Chronic infarct right cerebellum. MRA with  moderate stenosis left posterior cerebral artery. No other significant intracranial stenosis. Patient did not receive TPA  HOSPITAL COURSE:   Stroke: Small L lateral thalamicinfarct secondary to small vessel disease source  Resultant Left face and left hand numbness  CT head no acute abnormality   MRIsmall left lateral thalamus and internal capsule infarct. Old right cerebellar infarct  MRAmoderate stenosis L PCA  CarotidDoppler1- 39 percent stenosis involving the right internal carotid artery and the left internal carotid artery.  2D Echo as above  LDL92  HgbA1c10.9 on 11/10/16,    SCDs /Lovenox  Diet Heart Room service appropriate? Yes; Fluid consistency: Thin  aspirin 325 mg dailyprior to admission, now on aspirin 325 mg daily. Pt has plavix allergy, will continue ASA on discharge.   Patient counseled to be compliant with herantithrombotic medications  Ongoing aggressive stroke risk factor management  Therapy recommendations: CIR   HTN: allow permissive HTN as above (slight decrease in typical range due to aneurysm) Gradually resume antihypertensive medications    DM: Most recent hemoglobin A1c from this admission still pending Continue home regimen of insulin 10.9 in Feb, goal < 7.0 Poorly controlled Patient is in the process of setting up with endocrinology Dr. Dwyane Dee   Depression/Anxiety: - continue Klonopin and celexa  Right ICA siphon 26m aneurysm - outpt follow up needed   PVD s/p fem-pop, stent, followed by [facility]  Nausea-this was early this morning before breakfast, resolved, labs including liver function and lipase within normal limits Did not suspect gastroparesis, and this was just a one-time episode    Discharge Exam: *  Blood pressure (!) 115/94, pulse 66, temperature 99 F (37.2 C), temperature source Oral, resp. rate 18, height _0  (1.676 m), weight 79.8 kg (176 lb), SpO2 96 %.  General exam: Appears calm and  comfortable  Respiratory system: Clear to auscultation. Respiratory effort normal. Cardiovascular system: S1 & S2 heard, RRR. No JVD, murmurs, rubs, gallops or clicks. No pedal edema. Gastrointestinal system: Abdomen is nondistended, soft and nontender. No organomegaly or masses felt. Normal bowel sounds heard. Central nervous system: Alert and oriented. No focal neurological deficits. Extremities: Symmetric 5 x 5 power. Skin: No rashes, lesions or ulcers Psychiatry: Judgement and insight appear normal. Mood & affect appropriate    Follow-up Information    MUnk Pinto MD. Call.   Specialty:  Internal Medicine Why:  Hospital follow-up in one to 2 weeks Contact information: [phone]        XRosalin Hawking MD. Call.   Specialty:  Neurology Why:  To make appointment, stroke follow-up in 4-6 weeks Contact information: [phone]           Signed: NReyne Dumas5/06/2017, 9:15 AM        Time spent >45 mins

## 2017-01-28 ENCOUNTER — Telehealth: Payer: Self-pay | Admitting: *Deleted

## 2017-01-28 ENCOUNTER — Other Ambulatory Visit: Payer: Self-pay | Admitting: Internal Medicine

## 2017-01-28 DIAGNOSIS — I6350 Cerebral infarction due to unspecified occlusion or stenosis of unspecified cerebral artery: Secondary | ICD-10-CM | POA: Diagnosis not present

## 2017-01-28 DIAGNOSIS — I10 Essential (primary) hypertension: Secondary | ICD-10-CM | POA: Diagnosis not present

## 2017-01-28 DIAGNOSIS — K219 Gastro-esophageal reflux disease without esophagitis: Secondary | ICD-10-CM | POA: Diagnosis not present

## 2017-01-28 DIAGNOSIS — Z794 Long term (current) use of insulin: Secondary | ICD-10-CM | POA: Diagnosis not present

## 2017-01-28 DIAGNOSIS — E1151 Type 2 diabetes mellitus with diabetic peripheral angiopathy without gangrene: Secondary | ICD-10-CM | POA: Diagnosis not present

## 2017-01-28 DIAGNOSIS — I6503 Occlusion and stenosis of bilateral vertebral arteries: Secondary | ICD-10-CM | POA: Diagnosis not present

## 2017-01-28 DIAGNOSIS — R201 Hypoesthesia of skin: Secondary | ICD-10-CM | POA: Diagnosis not present

## 2017-01-28 DIAGNOSIS — F329 Major depressive disorder, single episode, unspecified: Secondary | ICD-10-CM | POA: Diagnosis not present

## 2017-01-28 DIAGNOSIS — K5790 Diverticulosis of intestine, part unspecified, without perforation or abscess without bleeding: Secondary | ICD-10-CM | POA: Diagnosis not present

## 2017-01-28 DIAGNOSIS — I672 Cerebral atherosclerosis: Secondary | ICD-10-CM | POA: Diagnosis not present

## 2017-01-28 DIAGNOSIS — F419 Anxiety disorder, unspecified: Secondary | ICD-10-CM | POA: Diagnosis not present

## 2017-01-28 DIAGNOSIS — I6523 Occlusion and stenosis of bilateral carotid arteries: Secondary | ICD-10-CM | POA: Diagnosis not present

## 2017-01-28 DIAGNOSIS — I69398 Other sequelae of cerebral infarction: Secondary | ICD-10-CM | POA: Diagnosis not present

## 2017-01-28 DIAGNOSIS — R2689 Other abnormalities of gait and mobility: Secondary | ICD-10-CM | POA: Diagnosis not present

## 2017-01-28 DIAGNOSIS — I69351 Hemiplegia and hemiparesis following cerebral infarction affecting right dominant side: Secondary | ICD-10-CM | POA: Diagnosis not present

## 2017-01-28 LAB — VAS US CAROTID
LCCADSYS: -177 cm/s
LCCAPSYS: 81 cm/s
LEFT ECA DIAS: -26 cm/s
LEFT VERTEBRAL DIAS: -17 cm/s
Left CCA dist dias: -23 cm/s
Left CCA prox dias: 16 cm/s
Left ICA dist dias: -28 cm/s
Left ICA dist sys: -114 cm/s
Left ICA prox dias: 16 cm/s
Left ICA prox sys: 156 cm/s
RCCAPDIAS: -14 cm/s
RCCAPSYS: -113 cm/s
RIGHT ECA DIAS: -16 cm/s
RIGHT VERTEBRAL DIAS: -10 cm/s
Right cca dist sys: -80 cm/s

## 2017-01-28 NOTE — Telephone Encounter (Signed)
CALLED PT SCHEDULED HER FOR A HOSP FU ON Monday 01/31/17

## 2017-01-31 ENCOUNTER — Encounter: Payer: Self-pay | Admitting: Internal Medicine

## 2017-01-31 ENCOUNTER — Ambulatory Visit (INDEPENDENT_AMBULATORY_CARE_PROVIDER_SITE_OTHER): Payer: Medicare Other | Admitting: Internal Medicine

## 2017-01-31 VITALS — BP 132/78 | HR 72 | Temp 97.6°F | Resp 16 | Ht 66.0 in | Wt 176.4 lb

## 2017-01-31 DIAGNOSIS — Z79899 Other long term (current) drug therapy: Secondary | ICD-10-CM | POA: Diagnosis not present

## 2017-01-31 DIAGNOSIS — E119 Type 2 diabetes mellitus without complications: Secondary | ICD-10-CM | POA: Diagnosis not present

## 2017-01-31 DIAGNOSIS — Z794 Long term (current) use of insulin: Secondary | ICD-10-CM | POA: Diagnosis not present

## 2017-01-31 DIAGNOSIS — I639 Cerebral infarction, unspecified: Secondary | ICD-10-CM | POA: Diagnosis not present

## 2017-01-31 DIAGNOSIS — E1122 Type 2 diabetes mellitus with diabetic chronic kidney disease: Secondary | ICD-10-CM

## 2017-01-31 DIAGNOSIS — N183 Chronic kidney disease, stage 3 (moderate): Secondary | ICD-10-CM

## 2017-01-31 DIAGNOSIS — I671 Cerebral aneurysm, nonruptured: Secondary | ICD-10-CM

## 2017-01-31 DIAGNOSIS — R0989 Other specified symptoms and signs involving the circulatory and respiratory systems: Secondary | ICD-10-CM

## 2017-01-31 DIAGNOSIS — E559 Vitamin D deficiency, unspecified: Secondary | ICD-10-CM | POA: Diagnosis not present

## 2017-01-31 LAB — CBC WITH DIFFERENTIAL/PLATELET
BASOS PCT: 1 %
Basophils Absolute: 99 cells/uL (ref 0–200)
EOS PCT: 1 %
Eosinophils Absolute: 99 cells/uL (ref 15–500)
HCT: 45.2 % — ABNORMAL HIGH (ref 35.0–45.0)
Hemoglobin: 14.5 g/dL (ref 11.7–15.5)
LYMPHS ABS: 2871 {cells}/uL (ref 850–3900)
LYMPHS PCT: 29 %
MCH: 31.1 pg (ref 27.0–33.0)
MCHC: 32.1 g/dL (ref 32.0–36.0)
MCV: 97 fL (ref 80.0–100.0)
MPV: 10.7 fL (ref 7.5–12.5)
Monocytes Absolute: 594 cells/uL (ref 200–950)
Monocytes Relative: 6 %
NEUTROS PCT: 63 %
Neutro Abs: 6237 cells/uL (ref 1500–7800)
PLATELETS: 330 10*3/uL (ref 140–400)
RBC: 4.66 MIL/uL (ref 3.80–5.10)
RDW: 13 % (ref 11.0–15.0)
WBC: 9.9 10*3/uL (ref 3.8–10.8)

## 2017-01-31 MED ORDER — ASPIRIN-DIPYRIDAMOLE ER 25-200 MG PO CP12: 1.0000 | ORAL_CAPSULE | Freq: Two times a day (BID) | ORAL | 1 refills | Status: DC

## 2017-01-31 NOTE — Progress Notes (Signed)
This very nice 71 y.o. 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 MRA showed mod stenosis of the Lt posterior cerebral Aa. 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  Carotid dopplers & 2D echocardiogram had no significant abnormalities. During hospitalization, BP's were mildly elevated and permissively monitored. Patient remained stable, improved and was d/c'd on 01/27/2017   for out Patient f/u. Patient was advised to continue ASA 325 mg daily & saw Dr Catalina Gravel on 01/18/2017 and he advised her to switch to Aggrenox and further advised that she f/u with a traditional Neurologist as advised at hospital discharge (Dr Lavera Guise).  Patient was contacted post discharge by office staff to assure stability and schedule f/u.      Hospital discharge instructions and medications were reconciled with the patient.     Patient has been followed longstanding for  Hypertension , Hyperlipidemia,  Insulin Requiring T2_DM and Vitamin D Deficiency.      Patient is treated for HTN (2000) & BP has been controlled at home. Today's BP is at goal - 132/78. Patient has had no complaints of any cardiac type chest pain, palpitations, dyspnea/orthopnea/PND, dizziness, claudication, or dependent edema.     Hyperlipidemia is controlled with diet & meds. Patient denies myalgias or other med SE's. Last Lipids were at goal: Lab Results  Component Value Date   CHOL 156 01/26/2017   HDL 35 (L) 01/26/2017   LDLCALC 92 01/26/2017   LDLDIRECT 146.4 01/30/2010   TRIG 144 01/26/2017   CHOLHDL 4.5 01/26/2017      Also, the patient has history of  Insulin requiring T2_DM  Predates from 1998 with ultimate addition of Insulin to accommodate her gluttony and poor dietary  compliance.  Patient seems to have little insight into the source /amount of he dietary indiscretions. A1c's and random CBG's have been in poor control.  Prior to her recent stroke, she had not been monitoring CBG's consistently.  She takes little ownership for her role in her Diabetes. She denies symptoms of reactive hypoglycemia, diabetic polys, paresthesias or visual blurring.  Last A1c was not at goal She does have an upcoming consult with Dr Dwyane Dee for her Diabetes. Lab Results  Component Value Date   HGBA1C 10.9 (H) 11/10/2016      Further, the patient also has history of Vitamin D Deficiency ("31" in 2012)  and supplements vitamin D very inconsistently. Last vitamin D was low:  Lab Results  Component Value Date   VD25OH 22 (L) 07/22/2016   Current Outpatient Prescriptions on File Prior to Visit  Sig  . Take 1 tablet (10 mg total) by mouth daily.  . Take 1 capsule by mouth daily.  . Check blood sugar up to three times daily  . Take 1 capsule by mouth daily.  . Take 1,000 mg by mouth 2 (two) times daily.   Marland Kitchen TAKE 1 TABLET DAILY FOR MOOD & ANXIETY  . TAKE 1 TABLET BY MOUTH 2 TIMES DAILY  . Place 2 sprays into both nostrils daily.  . Use daily to check BS TID Dx. E11.22  . Inject 25 units in the AM Farley with food, and inject 25 units  with evening  meal. (Patient taking differently: Inject 12-20 Units into the skin 2 (two) times daily with a meal. Inject 12 units in the AM Reynolds with food, and inject 20 units Caney with evening meal.)  . Use two pens daily with insulin  . Take by mouth daily.  . Take 1 tablet (100 mg total) by mouth daily.  . Take 1 capsule by mouth daily.  . Take 2 tablets (1,000 mg total) by mouth 2 (two) times daily.  . Take 1 capsule by mouth daily.  .   . TAKE 1 TABLET (20 MG TOTAL) BY MOUTH AT BEDTIME.  . Take 400 Units by mouth daily.   No current facility-administered medications on file prior to visit.    Allergies  Allergen Reactions  . Plavix [Clopidogrel  Bisulfate] Palpitations  . Amoxicillin Itching and Swelling    FACE & EYES SWELL  . Ace Inhibitors   . Lyrica [Pregabalin]   . Penicillins Other (See Comments)    REACTION: unspecified   PMHx:   Past Medical History:  Diagnosis Date  . Abnormal findings on esophagogastroduodenoscopy (EGD) 07/2010  . Aneurysm [facility]) 2013   Right Brain   . Aneurysm ([place])    in brain x 2  . Anxiety   . Colon polyps   . Diabetes mellitus 1998  . Diverticulosis   . GERD (gastroesophageal reflux disease)   . Hemorrhoids   . Hiatal hernia   . Hyperlipidemia   . Hypertension 2000  . Migraines   . Peripheral vascular disease ([place])   . Phlebitis    30 years ago  left leg  . Status post colonoscopy 07/2010  . Stroke [facility])    multiple mini strokes ( brain aneurysm )   Immunization History  Administered Date(s) Administered  . DT 03/26/2015  . Influenza Whole 08/02/2007, 06/18/2009, 07/09/2013  . Influenza, High Dose Seasonal PF 08/05/2015, 07/22/2016  . Pneumococcal Conjugate-13 07/22/2016  . Pneumococcal Polysaccharide-23 02/08/2014  . Td 09/21/2004   Past Surgical History:  Procedure Laterality Date  . ABDOMINAL AORTAGRAM N/A 01/04/2012   Procedure: ABDOMINAL Maxcine Ham;  Surgeon: Serafina Mitchell, MD;  Location: [facility] CATH LAB;  Service: Cardiovascular;  Laterality: N/A;  . ABDOMINAL AORTAGRAM N/A 08/15/2012   Procedure: ABDOMINAL Maxcine Ham;  Surgeon: Serafina Mitchell, MD;  Location: [facility] CATH LAB;  Service: Cardiovascular;  Laterality: N/A;  . ABDOMINAL HYSTERECTOMY  1984  . cataract surgery Right 10-22-2015  . cataract surgery Left 11-05-2015  . CESAREAN SECTION    . CHOLECYSTECTOMY    . COLONOSCOPY  07/2010  . DENTAL SURGERY  Aug. 16, 2013   left lower   . ESOPHAGOGASTRODUODENOSCOPY  07/2010  . EYE SURGERY  Nov. 2014   Laser-Glaucoma  . FEMORAL ARTERY STENT  05/11/11   Left superficial femoral and popliteal artery  . FEMORAL-POPLITEAL BYPASS GRAFT  Nov. 26, 2013   Left Angiogram-   .  HERNIA REPAIR     times two  . KNEE SURGERY    . LOWER EXTREMITY ANGIOGRAM Left 08/15/2012   Procedure: LOWER EXTREMITY ANGIOGRAM;  Surgeon: Serafina Mitchell, MD;  Location: [facility] CATH LAB;  Service: Cardiovascular;  Laterality: Left;  lt leg angio  . rotator cuff surgery    . THYROID SURGERY     FHx:    Reviewed / unchanged  SHx:    Reviewed / unchanged  Systems Review:  Constitutional: Denies fever, chills, wt changes, headaches, insomnia, fatigue, night sweats, change in appetite. Eyes: Denies redness, blurred vision, diplopia, discharge,  itchy, watery eyes.  ENT: Denies discharge, congestion, post nasal drip, epistaxis, sore throat, earache, hearing loss, dental pain, tinnitus, vertigo, sinus pain, snoring.  CV: Denies chest pain, palpitations, irregular heartbeat, syncope, dyspnea, diaphoresis, orthopnea, PND, claudication or edema. Respiratory: denies cough, dyspnea, DOE, pleurisy, hoarseness, laryngitis, wheezing.  Gastrointestinal: Denies dysphagia, odynophagia, heartburn, reflux, water brash, abdominal pain or cramps, nausea, vomiting, bloating, diarrhea, constipation, hematemesis, melena, hematochezia  or hemorrhoids. Genitourinary: Denies dysuria, frequency, urgency, nocturia, hesitancy, discharge, hematuria or flank pain. Musculoskeletal: Denies arthralgias, myalgias, stiffness, jt. swelling, pain, limping or strain/sprain.  Skin: Denies pruritus, rash, hives, warts, acne, eczema or change in skin lesion(s). Neuro: No weakness, tremor, incoordination, spasms, paresthesia or pain. Psychiatric: Denies confusion, memory loss or sensory loss. Endo: Denies change in weight, skin or hair change.  Heme/Lymph: No excessive bleeding, bruising or enlarged lymph nodes.  Physical Exam  BP 132/78   Pulse 72   Temp 97.6 F (36.4 C)   Resp 16   Ht 5\' 6"  (1.676 m)   Wt 176 lb 6.4 oz (80 kg)   BMI 28.47 kg/m   Appears over nourished, well groomed  and in no distress. Very articulate.    Eyes: PERRLA, EOMs, conjunctiva no swelling or erythema. Sinuses: No frontal/maxillary tenderness ENT/Mouth: EAC's clear, TM's nl w/o erythema, bulging. Nares clear w/o erythema, swelling, exudates. Oropharynx clear without erythema or exudates. Oral hygiene is good. Tongue normal, non obstructing. Hearing intact.  Neck: Supple. Thyroid nl. Car 2+/2+ without bruits, nodes or JVD. Chest: Respirations nl with BS clear & equal w/o rales, rhonchi, wheezing or stridor.  Cor: Heart sounds normal w/ regular rate and rhythm without sig. murmurs, gallops, clicks or rubs. Peripheral pulses normal and equal  without edema.  Abdomen: Soft & bowel sounds normal. Non-tender w/o guarding, rebound, hernias, masses or organomegaly.  Lymphatics: Unremarkable.  Musculoskeletal: Full ROM all peripheral extremities, joint stability, 5/5 strength and normal gait. Balance is good.   Skin: Warm, dry without exposed rashes, lesions or ecchymosis apparent.  Neuro: Cranial nerves intact, reflexes equal bilaterally. Sensory-motor testing grossly intact with only slight decrease perception in distal Rt fingers to touch, Vibratory and Monofilament. Tendon reflexes grossly intact.  Pysch: Alert & oriented x 3.  Insight and judgement limited. No ideations.  Assessment and Plan:  1. Ischemic stroke ([place])  - dipyridamole-aspirin (AGGRENOX) 200-25 MG 12hr capsule; Take 1 capsule by mouth 2 (two) times daily.  Dispense: 180 capsule; Refill: 1  2. Cerebral aneurysm, diminutive   3. Labile hypertension  - Continue medication, monitor blood pressure at home.  - Continue DASH diet. Reminder to go to the ER if any CP,  SOB, nausea, dizziness, severe HA, changes vision/speech,  left arm numbness and tingling and jaw pain.  - CBC with Differential/Platelet - BASIC METABOLIC PANEL WITH GFR  4. Insulin-requiring or dependent type II diabetes mellitus (HCC)  - BASIC METABOLIC PANEL WITH GFR  5. Vitamin D deficiency  -  Reminded to start supplementation.  6. 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 Medication management  - CBC with Differential/Platelet - BASIC METABOLIC PANEL WITH GFR       Discussed  regular exercise, BP monitoring, weight control to achieve/maintain BMI less than 25 and discussed med and SE's. Recommended labs to assess and monitor clinical status with further disposition pending results of labs. Over 30 minutes of exam, counseling, chart review was performed.

## 2017-01-31 NOTE — Patient Instructions (Signed)
Ischemic Stroke °An ischemic stroke (cerebrovascular accident, or CVA) is the sudden death of brain tissue that occurs when an area of the brain does not get enough oxygen. It is a medical emergency that must be treated right away. An ischemic stroke can cause permanent loss of brain function. This can cause problems with how different parts of your body function. °What are the causes? °This condition is caused by a decrease of oxygen supply to an area of the brain, which may be the result of: °· A small blood clot (embolus) or a buildup of plaque in the blood vessels (atherosclerosis) that blocks blood flow in the brain. °· An abnormal heart rhythm (atrial fibrillation). °· A blocked or damaged artery in the head or neck. °What increases the risk? °Certain factors may make you more likely to develop this condition. Some of these factors are things that you can change, such as: °· Obesity. °· Smoking cigarettes. °· Taking oral birth control, especially if you also use tobacco. °· Physical inactivity. °· Excessive alcohol use. °· Use of illegal drugs, especially cocaine and methamphetamine. °Other risk factors include: °· High blood pressure (hypertension). °· High cholesterol. °· Diabetes mellitus. °· Heart disease. °· Being African American, Native American, Hispanic, or [place] Native. °· Being over age 60. °· Family history of stroke. °· Previous history of blood clots, stroke, or transient ischemic attack (TIA). °· Sickle cell disease. °· Being a woman with a history of preeclampsia. °· Migraine headache. °· Sleep apnea. °· Irregular heartbeats, such as atrial fibrillation. °· Chronic inflammatory diseases, such as rheumatoid arthritis or lupus. °· Blood clotting disorders (hypercoagulable state). °What are the signs or symptoms? °Symptoms of this condition usually develop suddenly, or you may notice them after waking up from sleep. Symptoms may include sudden: °· Weakness or numbness in your face, arm, or leg,  especially on one side of your body. °· Trouble walking or difficulty moving your arms or legs. °· Loss of balance or coordination. °· Confusion. °· Slurred speech (dysarthria). °· Trouble speaking, understanding speech, or both (aphasia). °· Vision changes--such as double vision, blurred vision, or loss of vision--in one or both eyes. °· Dizziness. °· Nausea and vomiting. °· Severe headache with no known cause. The headache is often described as the worst headache ever experienced. °If possible, make note of the exact time that you last felt like your normal self and what time your symptoms started. Tell your health care provider. °If symptoms come and go, this could be a sign of a warning stroke, or TIA. Get help right away, even if you feel better. °How is this diagnosed? °This condition may be diagnosed based on: °· Your symptoms, your medical history, and a physical exam. °· CT scan of the brain. °· MRI. °· CT angiogram. This test uses a computer to take X-rays of your arteries. A dye may be injected into your blood to show the inside of your blood vessels more clearly. °· MRI angiogram. This is a type of MRI that is used to evaluate the blood vessels. °· Cerebral angiogram. This test uses X-rays and a dye to show the blood vessels in the brain and neck. °You may need to see a health care provider who specializes in stroke care. A stroke specialist can be seen in person or through communication using telephone or television technology (telemedicine). °Other tests may also be done to find the cause of the stroke, such as: °· Electrocardiogram (ECG). °· Continuous heart monitoring. °· Echocardiogram. °·   Carotid ultrasound. °· A scan of the brain circulation. °· Blood tests. °· Sleep study to check for sleep apnea. °How is this treated? °Treatment for this condition will depend on the duration, severity, and cause of your symptoms and on the area of the brain affected. It is very important to get treatment at the  first sign of stroke symptoms. Some treatments work better if they are done within 3-6 hours of the onset of stroke symptoms. These initial treatments may include: °· Aspirin. °· Medicines to control blood pressure. °· Medicine given by injection to dissolve the blood clot (thrombolytic). °· Treatments given directly to the affected artery to remove or dissolve the blood clot. °Other treatment options may include: °· Oxygen. °· IV fluids. °· Medicines to thin the blood (anticoagulants or antiplatelets). °· Procedures to increase blood flow. °Medicines and changes to your diet may be used to help treat and manage risk factors for stroke, such as diabetes, high cholesterol, and high blood pressure. °After a stroke, you may work with physical, speech, mental health, or occupational therapists to help you recover. °Follow these instructions at home: °Medicines  °· Take over-the-counter and prescription medicines only as told by your health care provider. °· If you were told to take a medicine to thin your blood, such as aspirin or an anticoagulant, take it exactly as told by your health care provider. °¨ Taking too much blood-thinning medicine can cause bleeding. °¨ If you do not take enough blood-thinning medicine, you will not have the protection that you need against another stroke and other problems. °· Understand the side effects of taking anticoagulant medicine. When taking this type of medicine, make sure you: °¨ Hold pressure over any cuts for longer than usual. °¨ Tell your dentist and other health care providers that you are taking anticoagulants before you have any procedures that may cause bleeding. °¨ Avoid activities that may cause trauma or injury. °Eating and drinking  °· Follow instructions from your health care provider about diet. °· Eat healthy foods. °· If your ability to swallow was affected by the stroke, you may need to take steps to avoid choking, such as: °¨ Taking small bites when  eating. °¨ Eating foods that are soft or pureed. °Safety  °· Follow instructions from your health care team about physical activity. °· Use a walker or cane as told by your health care provider. °· Take steps to create a safe home environment in order to reduce the risk of falls. This may include: °¨ Having your home looked at by specialists. °¨ Installing grab bars in the bedroom and bathroom. °¨ Using safety equipment, such as raised toilets and a seat in the shower. °General instructions  °· Do not use any tobacco products, such as cigarettes, chewing tobacco, and e-cigarettes. If you need help quitting, ask your health care provider. °· Limit alcohol intake to no more than 1 drink a day for nonpregnant women and 2 drinks a day for men. One drink equals 12 oz of beer, 5 oz of wine, or 1½ oz of hard liquor. °· If you need help to stop using drugs or alcohol, ask your health care provider about a referral to a program or specialist. °· Maintain an active and healthy lifestyle. Get regular exercise as told by your health care provider. °· Keep all follow-up visits as told by your health care provider, including visits with all specialists on your health care team. This is important. °How is this prevented? °Your   risk of another stroke can be decreased by managing high blood pressure, high cholesterol, diabetes, heart disease, sleep apnea, and obesity. It can also be decreased by quitting smoking, limiting alcohol, and staying physically active. °Your health care provider will continue to work with you on measures to prevent short-term and long-term complications of stroke. °Get help right away if: °You have: °· Sudden weakness or numbness in your face, arm, or leg, especially on one side of your body. °· Sudden confusion. °· Sudden trouble speaking, understanding, or both (aphasia). °· Sudden trouble seeing with one or both eyes. °· Sudden trouble walking or difficulty moving your arms or legs. °· Sudden  dizziness. °· Sudden loss of balance or coordination. °· Sudden, severe headache with no known cause. °· A partial or total loss of consciousness. °· A seizure. °Any of these symptoms may represent a serious problem that is an emergency. Do not wait to see if the symptoms will go away. Get medical help right away. Call your local emergency services (911 in [place] Do not drive yourself to the hospital. °This information is not intended to replace advice given to you by your health care provider. Make sure you discuss any questions you have with your health care provider. °Document Released: 09/06/2005 Document Revised: 02/17/2016 Document Reviewed: 12/03/2015 °Elsevier Interactive Patient Education © 2017 [employer] ° °

## 2017-02-01 DIAGNOSIS — R2689 Other abnormalities of gait and mobility: Secondary | ICD-10-CM | POA: Diagnosis not present

## 2017-02-01 DIAGNOSIS — I69351 Hemiplegia and hemiparesis following cerebral infarction affecting right dominant side: Secondary | ICD-10-CM | POA: Diagnosis not present

## 2017-02-01 DIAGNOSIS — K5790 Diverticulosis of intestine, part unspecified, without perforation or abscess without bleeding: Secondary | ICD-10-CM | POA: Diagnosis not present

## 2017-02-01 DIAGNOSIS — I10 Essential (primary) hypertension: Secondary | ICD-10-CM | POA: Diagnosis not present

## 2017-02-01 DIAGNOSIS — E1151 Type 2 diabetes mellitus with diabetic peripheral angiopathy without gangrene: Secondary | ICD-10-CM | POA: Diagnosis not present

## 2017-02-01 DIAGNOSIS — I69398 Other sequelae of cerebral infarction: Secondary | ICD-10-CM | POA: Diagnosis not present

## 2017-02-01 LAB — BASIC METABOLIC PANEL WITH GFR
BUN: 14 mg/dL (ref 7–25)
CALCIUM: 9.1 mg/dL (ref 8.6–10.4)
CO2: 27 mmol/L (ref 20–31)
Chloride: 101 mmol/L (ref 98–110)
Creat: 1.15 mg/dL — ABNORMAL HIGH (ref 0.60–0.93)
GFR, EST AFRICAN AMERICAN: 56 mL/min — AB (ref >=60)
GFR, EST NON AFRICAN AMERICAN: 48 mL/min — AB (ref >=60)
Glucose, Bld: 287 mg/dL — ABNORMAL HIGH (ref 65–99)
Potassium: 4 mmol/L (ref 3.5–5.3)
SODIUM: 138 mmol/L (ref 135–146)

## 2017-02-04 ENCOUNTER — Other Ambulatory Visit: Payer: Self-pay | Admitting: Family

## 2017-02-04 DIAGNOSIS — E1151 Type 2 diabetes mellitus with diabetic peripheral angiopathy without gangrene: Secondary | ICD-10-CM | POA: Diagnosis not present

## 2017-02-04 DIAGNOSIS — I69351 Hemiplegia and hemiparesis following cerebral infarction affecting right dominant side: Secondary | ICD-10-CM | POA: Diagnosis not present

## 2017-02-04 DIAGNOSIS — R2689 Other abnormalities of gait and mobility: Secondary | ICD-10-CM | POA: Diagnosis not present

## 2017-02-04 DIAGNOSIS — I69398 Other sequelae of cerebral infarction: Secondary | ICD-10-CM | POA: Diagnosis not present

## 2017-02-04 DIAGNOSIS — K5790 Diverticulosis of intestine, part unspecified, without perforation or abscess without bleeding: Secondary | ICD-10-CM | POA: Diagnosis not present

## 2017-02-04 DIAGNOSIS — I10 Essential (primary) hypertension: Secondary | ICD-10-CM | POA: Diagnosis not present

## 2017-02-04 NOTE — Addendum Note (Signed)
Addended by: Kaleen Mask on: 02/04/2017 01:58 PM   Modules accepted: Orders

## 2017-02-04 NOTE — Addendum Note (Signed)
Addended by: Kaleen Mask on: 02/04/2017 01:37 PM   Modules accepted: Orders

## 2017-02-06 ENCOUNTER — Other Ambulatory Visit: Payer: Self-pay | Admitting: Physician Assistant

## 2017-02-11 ENCOUNTER — Other Ambulatory Visit: Payer: Self-pay | Admitting: *Deleted

## 2017-02-11 NOTE — Patient Outreach (Signed)
[place] [facility]) Care Management  02/11/2017  [name] 05-11-1946 [id number]  EMMI-Stroke RED ON EMMI ALERT DAY#: 13 DATE: 02/10/17 RED ALERT: [name] to follow-up appointment? No   Outreach attempt # 2 incoming call from patient. Spoke with patient. Reviewed and addressed red alert. Patient stated she had a follow-up appointment with [name] on 01/31/17 and [name] on 01/28/17. She stated, she returned to work on 02/07/17. Patient verbalized understanding of her hospital discharge instructions. She reported, she was not discharged on any new medications. She did not have any questions or concerns. Advised patient, she would continue to receive automated EMMI-Stroke post discharge calls. The automated calls are to assess how she is doing following recent hospitalization. Informed patient, she will receive a call from a nurse, if any of her responses are abnormal. Patient voiced understanding and was appreciative of f/u call.  Plan:  RN CM will notify [facility] CM administrative assistant regarding case closure.   [name], RN, BSN, MHA/MSL, [place] Telephonic Care Manager Coordinator [facility] Direct Phone: [phone] Toll Free: [phone] Fax: [phone]

## 2017-02-11 NOTE — Patient Outreach (Addendum)
[place] [facility]) Care Management  02/11/2017  [name] [birth year] [id number]   EMMI-Stroke RED ON EMMI ALERT DAY#: 13 DATE: 02/10/17 RED ALERT: [name] to follow-up appointment? No  Outreach attempt #1 to patient. Husband answered the phone and stated patient was not at home. RN CM left HIPAA compliant message along with contact info.     Plan:  RN CM will make outreach attempt to patient the next business day.  [name], RN, BSN, MHA/MSL, [place] Telephonic Care Manager Coordinator [facility] Direct Phone: [phone] Toll Free: [phone] Fax: [phone]

## 2017-02-14 DIAGNOSIS — E1151 Type 2 diabetes mellitus with diabetic peripheral angiopathy without gangrene: Secondary | ICD-10-CM | POA: Diagnosis not present

## 2017-02-14 DIAGNOSIS — K5790 Diverticulosis of intestine, part unspecified, without perforation or abscess without bleeding: Secondary | ICD-10-CM | POA: Diagnosis not present

## 2017-02-14 DIAGNOSIS — I69351 Hemiplegia and hemiparesis following cerebral infarction affecting right dominant side: Secondary | ICD-10-CM | POA: Diagnosis not present

## 2017-02-14 DIAGNOSIS — I10 Essential (primary) hypertension: Secondary | ICD-10-CM | POA: Diagnosis not present

## 2017-02-14 DIAGNOSIS — I69398 Other sequelae of cerebral infarction: Secondary | ICD-10-CM | POA: Diagnosis not present

## 2017-02-14 DIAGNOSIS — R2689 Other abnormalities of gait and mobility: Secondary | ICD-10-CM | POA: Diagnosis not present

## 2017-02-15 ENCOUNTER — Ambulatory Visit: Payer: Self-pay | Admitting: Internal Medicine

## 2017-02-15 ENCOUNTER — Other Ambulatory Visit: Payer: Self-pay

## 2017-02-15 ENCOUNTER — Ambulatory Visit: Payer: Self-pay | Admitting: *Deleted

## 2017-02-15 DIAGNOSIS — N183 Chronic kidney disease, stage 3 unspecified: Secondary | ICD-10-CM

## 2017-02-15 DIAGNOSIS — E1122 Type 2 diabetes mellitus with diabetic chronic kidney disease: Secondary | ICD-10-CM

## 2017-02-15 MED ORDER — METFORMIN HCL ER 500 MG PO TB24: 1000.0000 mg | ORAL_TABLET | Freq: Two times a day (BID) | ORAL | 1 refills | Status: DC

## 2017-02-24 ENCOUNTER — Ambulatory Visit (INDEPENDENT_AMBULATORY_CARE_PROVIDER_SITE_OTHER): Payer: Medicare Other | Admitting: Endocrinology

## 2017-02-24 ENCOUNTER — Encounter: Payer: Self-pay | Admitting: Endocrinology

## 2017-02-24 VITALS — BP 150/78 | HR 69 | Ht 66.0 in | Wt 177.0 lb

## 2017-02-24 DIAGNOSIS — Z794 Long term (current) use of insulin: Secondary | ICD-10-CM | POA: Diagnosis not present

## 2017-02-24 DIAGNOSIS — R1084 Generalized abdominal pain: Secondary | ICD-10-CM

## 2017-02-24 DIAGNOSIS — E782 Mixed hyperlipidemia: Secondary | ICD-10-CM | POA: Diagnosis not present

## 2017-02-24 DIAGNOSIS — E1142 Type 2 diabetes mellitus with diabetic polyneuropathy: Secondary | ICD-10-CM

## 2017-02-24 DIAGNOSIS — I10 Essential (primary) hypertension: Secondary | ICD-10-CM

## 2017-02-24 DIAGNOSIS — E1165 Type 2 diabetes mellitus with hyperglycemia: Secondary | ICD-10-CM

## 2017-02-24 DIAGNOSIS — I779 Disorder of arteries and arterioles, unspecified: Secondary | ICD-10-CM

## 2017-02-24 NOTE — Patient Instructions (Addendum)
Check blood sugars on waking up  4/7 days  Also check blood sugars about 2 hours after a meal and do this after different meals by rotation  Recommended blood sugar levels on waking up is 90-130 and about 2 hours after meal is 130-160  Please bring your blood sugar monitor to each visit, thank you  Novolin 70/30 16 units just before Bfst' ' PM dose 28 units at supper  No Metformin

## 2017-02-24 NOTE — Progress Notes (Signed)
Patient ID: [name], female   DOB: Aug 16, 1946, 70 y.o.   MRN: [id number]            Reason for Appointment: Consultation for Type 2 Diabetes  Referring physician: [name]   History of Present Illness:          Date of diagnosis of type 2 diabetes mellitus: 1998       Background history:   She was initially treated with metformin Subsequently for improved control and she has been tried on Invokana in 2015 and she thinks she took this for several months Apparently this caused decreased appetite and excessive weight loss and she was told to stop this especially since her blood sugars were not better She was also started on insulin about 8 years ago and is probably taking 70/30 insulin all along She did not improve her control with a trial of Januvia and 2017 also  Her blood sugars have been higher for several months and she thinks that until a couple of months ago she would get readings over 300 and sometimes over 400 also with her regimen of insulin and metformin and her A1c had gone up to 10.9 in 2/18  Recent history:   INSULIN regimen is:  Novolin 70/30 12 units a.m.--25 units ac dinner       Non-insulin hypoglycemic drugs the patient is taking are: Metformin ER 1 g twice a day  Current management, blood sugar patterns and problems identified:  She takes her morning insulin about an hour before she is eating anything in the morning and may tend to feel shaky and weak before she eats a breakfast at work  This is despite her morning sugars FASTING being mostly high close to 200  Although she has been prescribed 25 units twice a day of her insulin doses of tendency to hypoglycemia she takes only 12 units in the morning  She is trying to take her evening insulin just before supper time  Before supper her blood sugars are still around 200 and also today late afternoon her blood sugar at home was 201  She does not check her sugars after evening meal at all  Since her  hospitalization for stroke she has cut back on her portions and is trying to eat lower fat and lower carbohydrate meals  Because of lack of adequate insurance coverage for her Accu-Chek meter she is using a [employer] brand        Side effects from medications have been: Decreased appetite and weight loss with Invokana  Compliance with the medical regimen: Improving Hypoglycemia:   as above  Glucose monitoring:  done 2 times a day         Glucometer:  [employer] brand      Blood Glucose readings by recall   time of day and averages from meter download:  PREMEAL Breakfast Lunch Dinner Bedtime  Overall   Glucose range: 167-227 201 200    Median:        Self-care: The diet that the patient has been following is: tries to limit portions.      Meal times are:  Breakfast is at 9 am snack 10.30 am  Dinner:6-7   Typical meal intake: Breakfast is bar               Dietician visit, most recent:Several years ago               Exercise: none Recently  Weight history:   Wt Readings from  Last 3 Encounters:  02/24/17 177 lb (80.3 kg)  01/31/17 176 lb 6.4 oz (80 kg)  01/25/17 176 lb (79.8 kg)    Glycemic control:8.9   Lab Results  Component Value Date   HGBA1C 10.9 (H) 11/10/2016   HGBA1C 9.3 (H) 07/22/2016   HGBA1C 9.5 (H) 02/02/2016   Lab Results  Component Value Date   MICROALBUR 75.3 07/22/2016   LDLCALC 92 01/26/2017   CREATININE 1.15 (H) 01/31/2017   Lab Results  Component Value Date   MICRALBCREAT 1,061 (H) 07/22/2016    No results found for: FRUCTOSAMINE    Allergies as of 02/24/2017      Reactions   Plavix [clopidogrel Bisulfate] Palpitations   Amoxicillin Itching, Swelling   FACE & EYES SWELL   Ace Inhibitors    Lyrica [pregabalin]    Penicillins Other (See Comments)   REACTION: unspecified      Medication List       Accurate as of 02/24/17  9:15 PM. Always use your most recent med list.          ACCU-CHEK AVIVA PLUS w/Device Kit Check blood sugar up to  three times daily   amLODipine 10 MG tablet Commonly known as:  NORVASC TAKE 1 TABLET (10 MG TOTAL) BY MOUTH DAILY.   b complex vitamins capsule Take 1 capsule by mouth daily.   CINNAMON PO Take 1,000 mg by mouth 2 (two) times daily.   citalopram 40 MG tablet Commonly known as:  CELEXA TAKE 1 TABLET DAILY FOR MOOD & ANXIETY   clonazePAM 0.5 MG tablet Commonly known as:  KLONOPIN TAKE 1 TABLET BY MOUTH 2 TIMES DAILY   dipyridamole-aspirin 200-25 MG 12hr capsule Commonly known as:  AGGRENOX Take 1 capsule by mouth 2 (two) times daily.   Fish Oil 1200 MG Caps Take 1 capsule by mouth daily.   fluticasone 50 MCG/ACT nasal spray Commonly known as:  FLONASE Place 2 sprays into both nostrils daily.   glucose blood test strip Commonly known as:  ACCU-CHEK AVIVA PLUS Use daily to check BS TID Dx. E11.22   insulin NPH-regular Human (70-30) 100 UNIT/ML injection Commonly known as:  NOVOLIN 70/30 Inject 25 units in the AM [place] with food, and inject 25 units [place] with evening meal.   Insulin Syringe-Needle U-100 31G X 15/64" 0.5 ML Misc Use two pens daily with insulin   IRON PO Take by mouth daily.   losartan 100 MG tablet Commonly known as:  COZAAR Take 1 tablet (100 mg total) by mouth daily.   Magnesium 500 MG Caps Take 1 capsule by mouth daily.   metFORMIN 500 MG 24 hr tablet Commonly known as:  GLUCOPHAGE-XR Take 2 tablets (1,000 mg total) by mouth 2 (two) times daily.   omeprazole 40 MG capsule Commonly known as:  PRILOSEC   simvastatin 20 MG tablet Commonly known as:  ZOCOR TAKE 1 TABLET (20 MG TOTAL) BY MOUTH AT BEDTIME.   Vitamin D3 5000 units Tabs Take 1 capsule by mouth daily.   vitamin E 400 UNIT capsule Generic drug:  vitamin E Take 400 Units by mouth daily.       Allergies:  Allergies  Allergen Reactions  . Plavix [Clopidogrel Bisulfate] Palpitations  . Amoxicillin Itching and Swelling    FACE & EYES SWELL  . Ace Inhibitors   . Lyrica  [Pregabalin]   . Penicillins Other (See Comments)    REACTION: unspecified    Past Medical History:  Diagnosis Date  . Abnormal findings  on esophagogastroduodenoscopy (EGD) 07/2010  . Aneurysm [facility]) 2013   Right Brain   . 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 Anxiety   . Colon polyps   . Diabetes mellitus 1998  . Diverticulosis   . GERD (gastroesophageal reflux disease)   . Hemorrhoids   . Hiatal hernia   . Hyperlipidemia   . Hypertension 2000  . Migraines   . Peripheral vascular disease ([place])   . Phlebitis    30 years ago  left leg  . Status post colonoscopy 07/2010  . Stroke [facility])    multiple mini strokes ( brain aneurysm )    Past Surgical History:  Procedure Laterality Date  . ABDOMINAL AORTAGRAM N/A 01/04/2012   Procedure: ABDOMINAL Maxcine Ham;  Surgeon: Serafina Mitchell, MD;  [facility] LAB;  Service: Cardiovascular;  Laterality: N/A;  . ABDOMINAL AORTAGRAM N/A 08/15/2012   Procedure: ABDOMINAL Maxcine Ham;  Surgeon: Serafina Mitchell, MD;  Location: [facility] CATH LAB;  Service: Cardiovascular;  Laterality: N/A;  . ABDOMINAL HYSTERECTOMY  1984  . cataract surgery Right 10-22-2015  . cataract surgery Left 11-05-2015  . CESAREAN SECTION    . CHOLECYSTECTOMY    . COLONOSCOPY  07/2010  . DENTAL SURGERY  Aug. 16, 2013   left lower   . ESOPHAGOGASTRODUODENOSCOPY  07/2010  . EYE SURGERY  Nov. 2014   Laser-Glaucoma  . FEMORAL ARTERY STENT  05/11/11   Left superficial femoral and popliteal artery  . FEMORAL-POPLITEAL BYPASS GRAFT  Nov. 26, 2013   Left Angiogram-   . HERNIA REPAIR     times two  . KNEE SURGERY    . LOWER EXTREMITY ANGIOGRAM Left 08/15/2012   Procedure: LOWER EXTREMITY ANGIOGRAM;  Surgeon: Serafina Mitchell, MD;  Location: [facility] CATH LAB;  Service: Cardiovascular;  Laterality: Left;  lt leg angio  . rotator cuff surgery    . THYROID SURGERY      Family History  Problem Relation Age of Onset  . CAD Mother 9       Died of MI  . Hypertension Mother   .  Heart attack Mother   . Heart disease Mother   . CAD Father 23       Died of MI  . Heart disease Father   . Heart attack Father   . CAD Brother 33       Two brothers died of MI  . Heart attack Brother   . Heart disease Brother        Amputation  . Diabetes Sister   . Hypertension Sister   . Heart attack Brother   . Heart disease Brother     Social History:  reports that she has been smoking Cigarettes.  She has a 20.00 pack-year smoking history. She has never used smokeless tobacco. She reports that she does not drink alcohol or use drugs.   Review of Systems  Constitutional: Negative for weight loss and reduced appetite.  HENT: Positive for trouble swallowing.   Eyes: Negative for blurred vision.  Respiratory: Negative for shortness of breath.   Cardiovascular: Negative for chest pain and leg swelling.  Gastrointestinal: Positive for nausea, vomiting and abdominal pain.       Persistent symptoms for up to one year with her stomach hurting about 15 minutes after eating usually.  Also has nausea and sometimes vomiting She has loose stools at least 3-4 times a day especially after meals Has not been evaluated for this  Endocrine: Positive for  fatigue.  Musculoskeletal: Negative for joint pain.  Skin: Negative for rash.  Neurological: Positive for weakness, numbness and tingling.       Recovering from a right-sided stroke with weakness and numbness in the face Also has had some long-standing numbness and tingling in feet, slightly less in the left recently  Psychiatric/Behavioral: Positive for depressed mood.       Takes citalopram     Lipid history: Patient treated by PCP with Zocor, LDL 92 recently    Lab Results  Component Value Date   CHOL 156 01/26/2017   HDL 35 (L) 01/26/2017   LDLCALC 92 01/26/2017   LDLDIRECT 146.4 01/30/2010   TRIG 144 01/26/2017   CHOLHDL 4.5 01/26/2017           Hypertension:Treatment regimen includes losartan, started on treatment a few  years after diagnosis of diabetes  Most recent eye exam was 11/17  Most recent foot exam:6/18  She has had long-standing small multinodular goiter with small stable nodules, last ultrasound in 2/18  LABS:  No visits with results within 1 Week(s) from this visit.  Latest known visit with results is:  Office Visit on 01/31/2017  Component Date Value Ref Range Status  . WBC 01/31/2017 9.9  3.8 - 10.8 K/uL Final  . RBC 01/31/2017 4.66  3.80 - 5.10 MIL/uL Final  . Hemoglobin 01/31/2017 14.5  11.7 - 15.5 g/dL Final  . HCT 01/31/2017 45.2* 35.0 - 45.0 % Final  . MCV 01/31/2017 97.0  80.0 - 100.0 fL Final  . MCH 01/31/2017 31.1  27.0 - 33.0 pg Final  . MCHC 01/31/2017 32.1  32.0 - 36.0 g/dL Final  . RDW 01/31/2017 13.0  11.0 - 15.0 % Final  . Platelets 01/31/2017 330  140 - 400 K/uL Final  . MPV 01/31/2017 10.7  7.5 - 12.5 fL Final  . Neutro Abs 01/31/2017 6237  1,500 - 7,800 cells/uL Final  . Lymphs Abs 01/31/2017 2871  850 - 3,900 cells/uL Final  . Monocytes Absolute 01/31/2017 594  200 - 950 cells/uL Final  . Eosinophils Absolute 01/31/2017 99  15 - 500 cells/uL Final  . Basophils Absolute 01/31/2017 99  0 - 200 cells/uL Final  . Neutrophils Relative % 01/31/2017 63  % Final  . Lymphocytes Relative 01/31/2017 29  % Final  . Monocytes Relative 01/31/2017 6  % Final  . Eosinophils Relative 01/31/2017 1  % Final  . Basophils Relative 01/31/2017 1  % Final  . Smear Review 01/31/2017 Criteria for review not met   Final  . Sodium 01/31/2017 138  135 - 146 mmol/L Final  . Potassium 01/31/2017 4.0  3.5 - 5.3 mmol/L Final  . Chloride 01/31/2017 101  98 - 110 mmol/L Final  . CO2 01/31/2017 27  20 - 31 mmol/L Final  . Glucose, Bld 01/31/2017 287* 65 - 99 mg/dL Final  . BUN 01/31/2017 14  7 - 25 mg/dL Final  . Creat 01/31/2017 1.15* 0.60 - 0.93 mg/dL Final   Comment:   For patients > or = 70 years of age: The upper reference limit for Creatinine is approximately 13% higher for people  identified as African-American.     . Calcium 01/31/2017 9.1  8.6 - 10.4 mg/dL Final  . GFR, Est African American 01/31/2017 56* >=60 mL/min Final  . GFR, Est Non African American 01/31/2017 48* >=60 mL/min Final   Comment:   The estimated GFR is a calculation valid for adults (>=19 years old) that uses the CKD-EPI  algorithm to adjust for age and sex. It is   not to be used for children, pregnant women, hospitalized patients,    patients on dialysis, or with rapidly changing kidney function. According to the NKDEP, eGFR >89 is normal, 60-89 shows mild impairment, 30-59 shows moderate impairment, 15-29 shows severe impairment and <15 is ESRD.       Physical Examination:  BP (!) 150/78   Pulse 69   Ht '5\' 6"'$  (1.676 m)   Wt 177 lb (80.3 kg)   SpO2 94%   BMI 28.57 kg/m   GENERAL:         Patient has mild generalized obesity.   HEENT:         Eye exam shows normal external appearance. Fundus exam shows no retinopathy.  Oral exam shows normal mucosa .  NECK:   There is no lymphadenopathy  Thyroid is not enlarged and no nodules felt.  Carotids are normal to palpation and no bruit heard LUNGS:         Chest is symmetrical. Lungs are clear to auscultation.Marland Kitchen   HEART:         Heart sounds:  S1 and S2 are normal. No murmur or click heard., no S3 or S4.   ABDOMEN:   There is no distention present.  She has mild diffuse tenderness, more in the left lower quadrant.  Liver and spleen are not palpable.  No other mass or tenderness present.    NEUROLOGICAL:   Ankle jerks are absent bilaterally.    Diabetic Foot Exam - Simple   Simple Foot Form Diabetic Foot exam was performed with the following findings:  Yes   Visual Inspection No deformities, no ulcerations, no other skin breakdown bilaterally:  Yes Sensation Testing See comments:  Yes Pulse Check See comments:  Yes Comments Decreased monofilament sensation in the toes especially on the right Bilaterally decreased pulses with only  1/4 posterior tibialis on the left and 2/4 on the right            Vibration sense is Moderately reduced in distal first toes. MUSCULOSKELETAL:  There is no swelling or deformity of the peripheral joints. Spine is normal to inspection.   EXTREMITIES:     There is no edema. No skin lesions present.Marland Kitchen SKIN:       No rash or lesions of concern.        ASSESSMENT:  Diabetes type 2, uncontrolled with mild obesity See history of present illness for detailed discussion of current diabetes management, blood sugar patterns and problems identified  Currently is being managed with premixed insulin twice a day and metformin Apparently intolerant to Invokana previously because of excessive weight loss    She has persistently high readings around 200 both fasting and before supper time with premixed insulin She has had persistently poor control of her diabetes with A1c at least 8% for the last 3-4 years  Only recently with her trying to significant improvement diet her sugars are better and her A1c has come down from a high level of 10.9 in February down to 8.9 today She does not monitor postprandial readings and these may be high Currently only taking small doses of insulin in the morning and this is being taken 1 hour before breakfast causing tendency to low sugars  Complications of diabetes: Mild peripheral NEUROPATHY, has history of proteinuria probably diabetic nephropathy  History of hypertension and hyperlipidemia.  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  Chronic but worsening abdominal pain, postprandial diarrhea, nausea and vomiting of unclear etiology, has not been evaluated for this Discussed with the patient at this is not diabetes related   PLAN:     She was given options for improving  her blood sugar control with basal bolus insulin regimen which would provide more optimal postprandial control and control of fasting hyperglycemia without tendency to daytime or overnight hypoglycemia  Discussed that she needs to be on a more physiological insulin regimen  Given her the option of using a basal and mealtime insulin with potentially 4 injections a day but she is somewhat reluctant to do this  Discussed in detail the use of the V-go pump to provide better control with less insulin and more optimal insulin delivery and she is interested in trying this.  She will verify her insurance coverage and also review information given today about the pump  If she does start the pump she may be able to start on the 20 unit basal along with bolus insulin of 4-6 units and will need to be educated by the diabetes educator  Also since she is having significant GI symptoms of unknown etiology these may be partly related at least to using metformin and not clear if this is beneficial; will need to have her stop metformin and get GI evaluation done  May also consider alternative medication such as Victoza to help her control and maintaining her weight loss once her GI symptoms are resolved  She does need to start checking her blood sugars more consistently after meals rather than just before breakfast and suppertime  For now she can continue using the [employer] brand but consider a brand name meter such as One Touch Verio if it is covered by her insurance.  She needs to check on this   Patient Instructions  Check blood sugars on waking up  4/7 days  Also check blood sugars about 2 hours after a meal and do this after different meals by rotation  Recommended blood sugar levels on waking up is 90-130 and about 2 hours after meal is 130-160  Please bring your blood sugar monitor to each visit, thank you  Novolin 70/30 16 units just before Bfst' ' PM dose 28 units at supper  No  Metformin     Counseling time on subjects discussed above is over 50% of today's 60 minute visit   Consultation note has been sent to the referring physician  [facility] 02/24/2017, 9:15 PM   Note: This office note was prepared with Dragon voice recognition system technology. Any transcriptional errors that result from this process are unintentional.

## 2017-02-25 ENCOUNTER — Telehealth: Payer: Self-pay

## 2017-02-25 LAB — POCT GLYCOSYLATED HEMOGLOBIN (HGB A1C): Hemoglobin A1C: 8.9

## 2017-02-25 NOTE — Telephone Encounter (Signed)
-----   Message from Irene Shipper, MD sent at 02/25/2017  9:25 AM EDT ----- Regarding: RE: Follow-up requested Ajay, Will do. Thanks.  Vaughan Basta, Patient with chronic abdominal pain for which she has been evaluated previously. Dr. Dwyane Dee would like re-evaluation. Please set her up to see an APP. Thanks jp ----- Message ----- From: Elayne Snare, MD Sent: 02/25/2017   8:49 AM To: Irene Shipper, MD Subject: Follow-up requested                            John: This lady needs further evaluation for her persistent abdominal pain, she is quite tender.  She tells me that there was some confusion about her last visit, please have your staff reschedule, thanks I told her that her pain is not diabetes related

## 2017-02-25 NOTE — Telephone Encounter (Signed)
Pt scheduled to see Amy Esterwood PA 02/28/17@8 :30am. Left messages for pt regarding appt.

## 2017-02-28 ENCOUNTER — Ambulatory Visit: Payer: Medicare Other | Admitting: Physician Assistant

## 2017-03-04 ENCOUNTER — Ambulatory Visit (INDEPENDENT_AMBULATORY_CARE_PROVIDER_SITE_OTHER): Payer: Medicare Other | Admitting: Physician Assistant

## 2017-03-04 ENCOUNTER — Encounter: Payer: Self-pay | Admitting: Physician Assistant

## 2017-03-04 VITALS — BP 138/72 | HR 66 | Ht 66.0 in | Wt 177.0 lb

## 2017-03-04 DIAGNOSIS — K219 Gastro-esophageal reflux disease without esophagitis: Secondary | ICD-10-CM | POA: Diagnosis not present

## 2017-03-04 DIAGNOSIS — R1084 Generalized abdominal pain: Secondary | ICD-10-CM | POA: Diagnosis not present

## 2017-03-04 DIAGNOSIS — K5730 Diverticulosis of large intestine without perforation or abscess without bleeding: Secondary | ICD-10-CM | POA: Diagnosis not present

## 2017-03-04 DIAGNOSIS — R112 Nausea with vomiting, unspecified: Secondary | ICD-10-CM | POA: Diagnosis not present

## 2017-03-04 DIAGNOSIS — I779 Disorder of arteries and arterioles, unspecified: Secondary | ICD-10-CM | POA: Diagnosis not present

## 2017-03-04 NOTE — Patient Instructions (Signed)
  You have been scheduled for a CT scan of the abdomen and pelvis at [facility] ([address]---this is in the same building as Press [facility]).   You are scheduled on Friday 6-22-2018at 3:00 PM. You should arrive 15 minutes prior to your appointment time for registration. Please follow the written instructions below on the day of your exam:  1) Do not eat  anything after 11:00 am (4 hours prior to your test) 2) You have been given 2 bottles of oral contrast to drink. The solution may taste               better if refrigerated, but do NOT add ice or any other liquid to this solution. Shake             well before drinking.    Drink 1 bottle of contrast @ 1:00 Pm (2 hours prior to your exam)  Drink 1 bottle of contrast @ 2:00 PM (1 hour prior to your exam)  You may take any medications as prescribed with a small amount of water except for the following: Metformin, Glucophage, Glucovance, Avandamet, Riomet, Fortamet, Actoplus Met, Janumet, Glumetza or Metaglip. The above medications must be held the day of the exam AND 48 hours after the exam.  The purpose of you drinking the oral contrast is to aid in the visualization of your intestinal tract. The contrast solution may cause some diarrhea. Before your exam is started, you will be given a small amount of fluid to drink. Depending on your individual set of symptoms, you may also receive an intravenous injection of x-ray contrast/dye. Plan on being at [facility] for 30 minutes or long, depending on the type of exam you are having performed.  If you have any questions regarding your exam or if you need to reschedule, you may call the CT department at [phone] between the hours of 8:00 am and 5:00 pm, Monday-Friday.  ________________________________________________________________________

## 2017-03-04 NOTE — Progress Notes (Signed)
Subjective:    Patient ID: [name], female    DOB: 29-Jan-1946, 71 y.o.   MRN: [id number]  HPI  [name] is a pleasant 70 year old African-American female known to Dr. Henrene Pastor. She comes in today with complaints of abdominal pain and intermittent nausea. She has diagnosis of hypertension, peripheral vascular disease, GERD, chronic kidney disease stage III, anxiety, diverticulosis and prior history of colon polyps. She has had multiple prior abdominal and pelvic surgeries including ventral hernia repair 3, most recent surgery 2010 with abdominal mesh. She is status post cholecystectomy C-section and hysterectomy. Patient had a very recent CVA in May 2018 and is now on Aggrenox. Last office visit here in March 2017, also complaining of chronic abdominal pain which dates back several years to the time of her hernia repairs. It was felt pain secondary to adhesions in presence of mesh. She was also complaining of early morning nausea. Was not felt that she needed interval endoscopic workup. Last EGD done in 2011 normal with the exception of small hiatal hernia and colonoscopy in 2011 showed severe diverticulosis and internal hemorrhoids, no polyps. She says she continues to have abdominal discomfort. She had been having urgency postprandially and diarrhea. She has been taken off of metformin which seems to have helped the diarrhea. She continues to have postprandial bowel movements with urgency but stools are formed. She says she feels better as soon as she has a bowel movement. She also complains of early morning nausea and sometimes vomits immediately after she gets up. Again ,she says  she will feel better immediately after vomiting. She complains of generalized bloating. Appetite has been fine, weight has been stable. On omeprazole 20 mg by mouth daily for chronic GERD. She says she doesn't want to take a lot of medications, just wants to be sure that there is nothing else wrong with her  abdomen.   Review of Systems Pertinent positive and negative review of systems were noted in the above HPI section.  All other review of systems was otherwise negative.  Outpatient Encounter Prescriptions as of 03/04/2017  Medication Sig  . amLODipine (NORVASC) 10 MG tablet TAKE 1 TABLET (10 MG TOTAL) BY MOUTH DAILY.  Marland Kitchen b complex vitamins capsule Take 1 capsule by mouth daily.  . Blood Glucose Monitoring Suppl (ACCU-CHEK AVIVA PLUS) W/DEVICE KIT Check blood sugar up to three times daily  . Cholecalciferol (VITAMIN D3) 5000 UNITS TABS Take 1 capsule by mouth daily.  Marland Kitchen CINNAMON PO Take 1,000 mg by mouth 2 (two) times daily.   . citalopram (CELEXA) 40 MG tablet TAKE 1 TABLET DAILY FOR MOOD & ANXIETY (Patient taking differently: TAKE 1/2 TABLET DAILY FOR MOOD & ANXIETY)  . clonazePAM (KLONOPIN) 0.5 MG tablet TAKE 1 TABLET BY MOUTH 2 TIMES DAILY (Patient taking differently: TAKE 1 TABLET BY MOUTH ONCE DAILY)  . dipyridamole-aspirin (AGGRENOX) 200-25 MG 12hr capsule Take 1 capsule by mouth 2 (two) times daily.  . fluticasone (FLONASE) 50 MCG/ACT nasal spray Place 2 sprays into both nostrils daily.  Marland Kitchen glucose blood (ACCU-CHEK AVIVA PLUS) test strip Use daily to check BS TID Dx. E11.22  . insulin NPH-regular Human (NOVOLIN 70/30) (70-30) 100 UNIT/ML injection Inject 25 units in the AM [place] with food, and inject 25 units [place] with evening meal. (Patient taking differently: Inject 20 Units into the skin 2 (two) times daily with a meal. Inject 20 units in the AM [place] with food, and inject 20 units [place] with evening meal.)  . Insulin Syringe-Needle  U-100 31G X 15/64" 0.5 ML MISC Use two pens daily with insulin  . IRON PO Take by mouth daily.  Marland Kitchen losartan (COZAAR) 100 MG tablet Take 1 tablet (100 mg total) by mouth daily.  . Magnesium 500 MG CAPS Take 1 capsule by mouth daily.  . Omega-3 Fatty Acids (FISH OIL) 1200 MG CAPS Take 1 capsule by mouth daily.  Marland Kitchen omeprazole (PRILOSEC) 40 MG capsule   . simvastatin (ZOCOR)  20 MG tablet TAKE 1 TABLET (20 MG TOTAL) BY MOUTH AT BEDTIME.  . vitamin E (VITAMIN E) 400 UNIT capsule Take 400 Units by mouth daily.  . [DISCONTINUED] metFORMIN (GLUCOPHAGE-XR) 500 MG 24 hr tablet Take 2 tablets (1,000 mg total) by mouth 2 (two) times daily.   No facility-administered encounter medications on file as of 03/04/2017.    Allergies  Allergen Reactions  . Plavix [Clopidogrel Bisulfate] Palpitations  . Amoxicillin Itching and Swelling    FACE & EYES SWELL  . Ace Inhibitors   . Lyrica [Pregabalin]   . Penicillins Other (See Comments)    REACTION: unspecified   Patient Active Problem List   Diagnosis Date Noted  . Stroke ([place]) 01/25/2017  . Diabetes mellitus with complication ([place]) 85/88/5027  . Depression with anxiety 01/25/2017  . Hyperlipidemia 11/07/2015  . Insulin-requiring or dependent type II diabetes mellitus ([place]) 11/07/2015  . Encounter for Medicare annual wellness exam 06/24/2015  . Generalized anxiety disorder 03/26/2015  . Depression, controlled 03/26/2015  . Cerebral infarction ([place]) 10/04/2014  . Tobacco abuse 10/04/2014  . Vitamin D deficiency 11/02/2013  . Medication management 11/02/2013  . Peripheral vascular disease due to secondary diabetes mellitus ([place]) 05/08/2012  . Atherosclerosis of native arteries of extremity with intermittent claudication 12/24/2011  . Diverticulosis of large intestine 06/09/2010  . History of colonic polyps 06/09/2010  . ESOPHAGEAL STRICTURE 08/27/2008  . Esophageal reflux 08/27/2008  . Nontoxic multinodular goiter 03/11/2008  . Glaucoma 03/11/2008  . CEREBRAL ANEURYSM 03/11/2008  . CKD stage 3 due to type 2 diabetes mellitus ([place]) 05/01/2007  . Essential hypertension 05/01/2007   Social History   Social History  . Marital status: Married    Spouse name: N/A  . Number of children: 2  . Years of education: N/A   Occupational History  . 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  Smoking status: Current Every Day Smoker    Packs/day: 0.50    Years: 40.00    Types: Cigarettes  . Smokeless tobacco: Never Used  . Alcohol use No  . Drug use: No  . Sexual activity: Not on file   Other Topics Concern  . Not on file   Social History Narrative   Lives with husband.            Ms. Harlacher family history includes CAD (age of onset: 67) in her brother; CAD (age of onset: 66) in her father and mother; Diabetes in her sister; Heart attack in her brother, brother, father, and mother; Heart disease in her brother, brother, father, and mother; Hypertension in her mother and sister.      Objective:    Vitals:   03/04/17 1427  BP: 138/72  Pulse: 66    Physical Exam  well-developed older African-American female in no acute distress, pleasant blood pressure 138/72 pulse 66, height 5 foot 6, weight 177, BMI 28.5. HEENT; nontraumatic normocephalic EOMI PERRLA sclera anicteric, Cardiovascular; regular rate and rhythm with S1-S2 no murmur or gallop, Pulmonary ;  clear bilaterally, Abdomen ;soft, she has rather generalized abdominal tenderness with light palpation, right upper quadrant and midline incisional scars. No palpable mass or hepatosplenomegaly no guarding or rebound, Rectal ;exam not done, Extremities; no clubbing cyanosis or edema skin warm and dry, Neuropsych; mood and affect appropriate       Assessment & Plan:   #27 71 year old African-American female with chronic complaints of abdominal discomfort/pain rather generalized with bloating and postprandial urgency without diarrhea. Her chronic abdominal pain is felt secondary to adhesions status post multiple abdominal and pelvic surgeries including 3 ventral hernia repairs and placement of mesh. 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 #2 chronic GERD-controlled on omeprazole #3 colon cancer screening-negative colonoscopy 2011, due for follow-up 2021 #4 diverticulosis #5  peripheral vascular disease #6 very recent CVA May 2018-now on Aggrenox #7 chronic kidney disease stage III #8 adult-onset diabetes mellitus #9 anxiety  Plan; continue omeprazole 40 mg by mouth every morning We will schedule for CT of the abdomen and pelvis with oral but no IV contrast Patient will stay off metformin which has improved loose stools Plan follow-up colonoscopy 2001. Patient will follow up with Dr Henrene Pastor or myself on an as-needed basis.  Amy S Esterwood PA-C 03/04/2017   Cc: [name], MD

## 2017-03-08 NOTE — Progress Notes (Signed)
Assessment and plans reviewed  

## 2017-03-09 ENCOUNTER — Ambulatory Visit: Payer: Medicare Other | Admitting: Nutrition

## 2017-03-11 ENCOUNTER — Other Ambulatory Visit: Payer: Medicare Other

## 2017-03-22 ENCOUNTER — Ambulatory Visit (INDEPENDENT_AMBULATORY_CARE_PROVIDER_SITE_OTHER): Payer: Medicare Other

## 2017-03-22 ENCOUNTER — Ambulatory Visit (INDEPENDENT_AMBULATORY_CARE_PROVIDER_SITE_OTHER): Payer: Medicare Other | Admitting: Podiatry

## 2017-03-22 ENCOUNTER — Encounter: Payer: Self-pay | Admitting: Podiatry

## 2017-03-22 DIAGNOSIS — E1159 Type 2 diabetes mellitus with other circulatory complications: Secondary | ICD-10-CM

## 2017-03-22 DIAGNOSIS — R52 Pain, unspecified: Secondary | ICD-10-CM

## 2017-03-22 DIAGNOSIS — G5762 Lesion of plantar nerve, left lower limb: Secondary | ICD-10-CM | POA: Diagnosis not present

## 2017-03-22 DIAGNOSIS — G5782 Other specified mononeuropathies of left lower limb: Secondary | ICD-10-CM

## 2017-03-22 DIAGNOSIS — I779 Disorder of arteries and arterioles, unspecified: Secondary | ICD-10-CM | POA: Diagnosis not present

## 2017-03-22 MED ORDER — MELOXICAM 15 MG PO TABS: 15.0000 mg | ORAL_TABLET | Freq: Every day | ORAL | 1 refills | Status: DC

## 2017-03-22 NOTE — Progress Notes (Signed)
Patient ID: [name], female   DOB: 05/19/1946, 71 y.o.   MRN: [id number] Complaint:  Visit Type: Patient returns to my office for continued preventative foot care services. Complaint: Patient states she has returned for nail care, but she is more concerned about pain that she is experiencing in her left forefoot.  She says that she has had no history of trauma or injury to her left foot.  She says that she has been unable to bear weight on her left forefoot and has been walking on her heel.  She says there is a sharp, radiating pain in her left forefoot   Patient has been diagnosed with DM with neuropathy..  No changes to ROS  Podiatric Exam: Vascular: dorsalis pedis and posterior tibial pulses are palpable bilateral. Capillary return is immediate. Temperature gradient is WNL. Skin turgor WNL  Sensorium: Diminished Semmes Weinstein monofilament test. Normal tactile sensation bilaterally. Nail Exam: Pt has thick disfigured discolored nails with subungual debris noted bilateral entire nail hallux through fifth toenails Ulcer Exam: There is no evidence of ulcer or pre-ulcerative changes or infection. Orthopedic Exam: Muscle tone and strength are WNL. No limitations in general ROM. No crepitus or effusions noted. Foot type and digits show no abnormalities. Bony prominences are unremarkable. Hammer toes B/L with plantarflexed metatarsals B/L.  Adductovarus fifth toe right foot. Palpable pain noted in the third interspace of the left foot. Upon dorsal palpation.  Upon plantar palpation. There is radiating pain from the third interspace  Into  back to the center of her arch Skin: No Porokeratosis. No infection or ulcers.  Listers corn noted fifth toe right foot. Diagnosis:   Neuroma third interspace left foot.    Treatment & Plan Procedures and Treatment:  Return office visit.  X-rays were taken reveal no pathology or arthritic changes noted on the third and fourth metatarsals right foot.  Elongated first  metatarsal noted.  Diagnosis. Patient as having a neuroma on her left forefoot.  Discussed proper footgear and treatment of the neuroma with anti-inflammatory medicine.  Prescribed Mobic.# 15.  Patient is not interested in injection therapy for relief from her neuroma.  Patient states that she will call the office in 2 weeks. If her pain continues Return Visit-Office Procedure: Patient instructed to return to the office for a follow up visit 3 months for continued preventive foot care services.    Gardiner Barefoot DPM

## 2017-03-28 ENCOUNTER — Telehealth: Payer: Self-pay | Admitting: Physician Assistant

## 2017-03-28 NOTE — Telephone Encounter (Signed)
Patient notified of appointment 04/04/17. She will drink contrast at 12 noon and 1 pm. She will arrive at 1:45 pm to [place] [facility]. She still has her written instructions.

## 2017-03-31 ENCOUNTER — Other Ambulatory Visit: Payer: Self-pay | Admitting: Internal Medicine

## 2017-03-31 DIAGNOSIS — F411 Generalized anxiety disorder: Secondary | ICD-10-CM

## 2017-03-31 NOTE — Telephone Encounter (Signed)
-   please call Clonazepam

## 2017-04-04 ENCOUNTER — Ambulatory Visit (INDEPENDENT_AMBULATORY_CARE_PROVIDER_SITE_OTHER)
Admission: RE | Admit: 2017-04-04 | Discharge: 2017-04-04 | Disposition: A | Discharge status: Home / Self Care | Payer: Medicare Other | Source: Ambulatory Visit | Attending: Physician Assistant | Admitting: Physician Assistant

## 2017-04-04 DIAGNOSIS — R1084 Generalized abdominal pain: Secondary | ICD-10-CM | POA: Diagnosis not present

## 2017-04-05 ENCOUNTER — Ambulatory Visit (INDEPENDENT_AMBULATORY_CARE_PROVIDER_SITE_OTHER): Payer: Medicare Other | Admitting: Podiatry

## 2017-04-05 ENCOUNTER — Encounter: Payer: Self-pay | Admitting: Podiatry

## 2017-04-05 DIAGNOSIS — I779 Disorder of arteries and arterioles, unspecified: Secondary | ICD-10-CM

## 2017-04-05 DIAGNOSIS — E1159 Type 2 diabetes mellitus with other circulatory complications: Secondary | ICD-10-CM

## 2017-04-05 DIAGNOSIS — G5762 Lesion of plantar nerve, left lower limb: Secondary | ICD-10-CM | POA: Diagnosis not present

## 2017-04-05 DIAGNOSIS — G5782 Other specified mononeuropathies of left lower limb: Secondary | ICD-10-CM

## 2017-04-05 NOTE — Progress Notes (Signed)
This patient presents the office follow-up for diagnosis of a neuroma third interspace left foot.  Patient was treated with Mobic by mouth.  She says she is having no pain and discomfort at this time in the medicine has helped.  She is very pleased with her progress and says she almost canceled her appointment since she has improved 90-100% from the initial exam.   GENERAL APPEARANCE: Alert, conversant. Appropriately groomed. No acute distress.  VASCULAR: Pedal pulses are  palpable at  [facility] and PT bilateral.  Capillary refill time is immediate to all digits,  Normal temperature gradient.  Digital hair growth is present bilateral  NEUROLOGIC: sensation is normal to 5.07 monofilament at 5/5 sites bilateral.  Light touch is intact bilateral, Muscle strength normal.  MUSCULOSKELETAL: acceptable muscle strength, tone and stability bilateral.  Hammer toes with plantarflexed metatarsal  B/L.  Adducto-varus fifth toe right foot.  No pain third interspace left foot.  DERMATOLOGIC: skin color, texture, and turgor are within normal limits.  No preulcerative lesions or ulcers  are seen, no interdigital maceration noted.  No open lesions present.  Digital nails are asymptomatic. No drainage noted.  S/P Neuroma 3rd interspace left foot.  ROV>  discussed this condition with this patient.  Told her if she needs additional medicine to call the office at which time Mobic will then be prescribed..  RTC prn   Gardiner Barefoot DPM

## 2017-04-06 ENCOUNTER — Other Ambulatory Visit: Payer: Medicare Other

## 2017-04-12 ENCOUNTER — Ambulatory Visit (INDEPENDENT_AMBULATORY_CARE_PROVIDER_SITE_OTHER): Payer: Medicare Other | Admitting: Neurology

## 2017-04-12 ENCOUNTER — Encounter: Payer: Medicare Other | Attending: Endocrinology | Admitting: Nutrition

## 2017-04-12 ENCOUNTER — Encounter: Payer: Self-pay | Admitting: Neurology

## 2017-04-12 ENCOUNTER — Encounter: Payer: Self-pay | Admitting: Physician Assistant

## 2017-04-12 VITALS — BP 138/64 | HR 65 | Ht 66.0 in | Wt 181.0 lb

## 2017-04-12 DIAGNOSIS — I671 Cerebral aneurysm, nonruptured: Secondary | ICD-10-CM

## 2017-04-12 DIAGNOSIS — I63332 Cerebral infarction due to thrombosis of left posterior cerebral artery: Secondary | ICD-10-CM

## 2017-04-12 DIAGNOSIS — I779 Disorder of arteries and arterioles, unspecified: Secondary | ICD-10-CM

## 2017-04-12 DIAGNOSIS — I739 Peripheral vascular disease, unspecified: Secondary | ICD-10-CM | POA: Diagnosis not present

## 2017-04-12 DIAGNOSIS — E1165 Type 2 diabetes mellitus with hyperglycemia: Secondary | ICD-10-CM

## 2017-04-12 DIAGNOSIS — E785 Hyperlipidemia, unspecified: Secondary | ICD-10-CM | POA: Diagnosis not present

## 2017-04-12 DIAGNOSIS — E1159 Type 2 diabetes mellitus with other circulatory complications: Secondary | ICD-10-CM

## 2017-04-12 DIAGNOSIS — E118 Type 2 diabetes mellitus with unspecified complications: Principal | ICD-10-CM

## 2017-04-12 DIAGNOSIS — I10 Essential (primary) hypertension: Secondary | ICD-10-CM

## 2017-04-12 NOTE — Patient Instructions (Addendum)
-   continue aggrenox and zocor for stroke prevention - check BP and glucose at home and record - Follow up with your primary care physician for stroke risk factor modification. Recommend maintain blood pressure goal <130/80, diabetes with hemoglobin A1c goal below 7.0% and lipids with LDL cholesterol goal below 70 mg/dL.  - continue to observe right sided numbness, if getting worse may consider gabapentin if needed.  - using visual assistance for the right hand manipulation  - healthy diet and regular exercise - follow up in 4 months.

## 2017-04-13 ENCOUNTER — Telehealth: Payer: Self-pay | Admitting: Nutrition

## 2017-04-13 ENCOUNTER — Encounter: Payer: Self-pay | Admitting: Neurology

## 2017-04-13 DIAGNOSIS — I739 Peripheral vascular disease, unspecified: Secondary | ICD-10-CM | POA: Insufficient documentation

## 2017-04-13 NOTE — Progress Notes (Signed)
STROKE NEUROLOGY FOLLOW UP NOTE  NAME: [name] DOB: 29-Mar-1946  REASON FOR VISIT: stroke follow up HISTORY FROM: pt and chart  Today we had the pleasure of seeing [name] in follow-up at our Neurology Clinic. Pt was accompanied by no one.   History Summary [name] is a 71 y.o. female with history of brain aneurysm, diabetes, hyperlipidemia, hypertension, migraines, PVD s/p fem-pop bypass and stent, TIA admitted on 01/25/17 for Right facial droop with Right-sided weakness with decreased sensation. MRI showed small left lateral thalamus and IC infarct as well as old right cerebellar infarct. MRA showed moderate left PCA stenosis and right ICA siphon 93mm aneurysm. CTA neck b/l ICA 30% and left CCA 50% stenosis, b/l VA mild stenosis. EF 60-65%. LDL 92 and A1C 8.9. She has plavix allergy, so continued on ASA and zocor on discharge.   Interval History During the interval time, the patient has been doing well. Her neurologist Dr. Catalina Gravel put her on aggrenox. However, continue to complain of right side hemiparesthesia and sometimes dropping things from right hand. Denies pain or burning sensation. Declined gabapentin and other medications at this time. BP and glucose controlled well as per pt. BP today 138/64.     REVIEW OF SYSTEMS: Full 14 system review of systems performed and notable only for those listed below and in HPI above, all others are negative:  Constitutional:   Cardiovascular:  Ear/Nose/Throat:   Skin:  Eyes:   Respiratory:   Gastroitestinal:   Genitourinary:  Hematology/Lymphatic:   Endocrine:  Musculoskeletal:  Joint swelling, muscle cramps, walking difficulty Allergy/Immunology:   Neurological:  Numbness, weakness Psychiatric: agitation, decreased concentration Sleep:   The following represents the patient's updated allergies and side effects list: Allergies  Allergen Reactions  . Plavix [Clopidogrel Bisulfate] Palpitations  . Amoxicillin Itching  and Swelling    FACE & EYES SWELL  . Ace Inhibitors   . Lyrica [Pregabalin]     Itch, gain weight  . Penicillins Other (See Comments)    REACTION: unspecified    The neurologically relevant items on the patient's problem list were reviewed on today's visit.  Neurologic Examination  A problem focused neurological exam (12 or more points of the single system neurologic examination, vital signs counts as 1 point, cranial nerves count for 8 points) was performed.  Blood pressure 138/64, pulse 65, height 5\' 6"  (1.676 m), weight 181 lb (82.1 kg).  General - Well nourished, well developed, in no apparent distress.  Ophthalmologic - Sharp disc margins OU.   Cardiovascular - Regular rate and rhythm with no murmur.  Mental Status -  Level of arousal and orientation to time, place, and person were intact. Language including expression, naming, repetition, comprehension was assessed and found intact. Attention span and concentration were normal. Fund of Knowledge was assessed and was intact.  Cranial Nerves II - XII - II - Visual field intact OU. III, IV, VI - Extraocular movements intact. V -  right facial touch and pinprick sensation decreased, about 50% comparing with left. VII - Facial movement intact bilaterally. VIII - Hearing & vestibular intact bilaterally. X - Palate elevates symmetrically. XI - Chin turning & shoulder shrug intact bilaterally. XII - Tongue protrusion intact.  Motor Strength - The patient's strength was normal in all extremities except subtle right hand dexterity difficulty and pronator drift was absent.  Bulk was normal and fasciculations were absent.   Motor Tone - Muscle tone was assessed at the neck and  appendages and was normal.  Reflexes - The patient's reflexes were 1+ in all extremities and she had no pathological reflexes.  Sensory - Light touch, temperature/pinprick were assessed and were symmetrical except decreased at right hand, 50% of the  left.    Coordination - The patient had normal movements in the hands and feet with no ataxia or dysmetria.  Tremor was absent.  Gait and Station - The patient's transfers, posture, gait, station, and turns were observed as normal.   Functional score  mRS = 1   0 - No symptoms.   1 - No significant disability. Able to carry out all usual activities, despite some symptoms.   2 - Slight disability. Able to look after own affairs without assistance, but unable to carry out all previous activities.   3 - Moderate disability. Requires some help, but able to walk unassisted.   4 - Moderately severe disability. Unable to attend to own bodily needs without assistance, and unable to walk unassisted.   5 - Severe disability. Requires constant nursing care and attention, bedridden, incontinent.   6 - Dead.   NIH Stroke Scale   Level Of Consciousness 0=Alert; keenly responsive 1=Not alert, but arousable by minor stimulation 2=Not alert, requires repeated stimulation 3=Responds only with reflex movements 0  LOC Questions to Month and Age 54=Answers both questions correctly 1=Answers one question correctly 2=Answers neither question correctly 0  LOC Commands      -Open/Close eyes     -Open/close grip 0=Performs both tasks correctly 1=Performs one task correctly 2=Performs neighter task correctly 0  Best Gaze 0=Normal 1=Partial gaze palsy 2=Forced deviation, or total gaze paresis 0  Visual 0=No visual loss 1=Partial hemianopia 2=Complete hemianopia 3=Bilateral hemianopia (blind including cortical blindness) 0  Facial Palsy 0=Normal symmetrical movement 1=Minor paralysis (asymmetry) 2=Partial paralysis (lower face) 3=Complete paralysis (upper and lower face) 0  Motor  0=No drift, limb holds posture for full 10 seconds 1=Drift, limb holds posture, no drift to bed 2=Some antigravity effort, cannot maintain posture, drifts to bed 3=No effort against gravity, limb falls 4=No  movement Right Arm 0     Leg 0    Left Arm 0     Leg 0  Limb Ataxia 0=Absent 1=Present in one limb 2=Present in two limbs 0  Sensory 0=Normal 1=Mild to moderate sensory loss 2=Severe to total sensory loss 1  Best Language 0=No aphasia, normal 1=Mild to moderate aphasia 2=Mute, global aphasia 3=Mute, global aphasia 0  Dysarthria 0=Normal 1=Mild to moderate 2=Severe, unintelligible or mute/anarthric 0  Extinction/Neglect 0=No abnormality 1=Extinction to bilateral simultaneous stimulation 2=Profound neglect 0  Total   1     Data reviewed: I personally reviewed the images and agree with the radiology interpretations.  Ct Head Wo Contrast 01/25/2017 No acute intracranial abnormality.   Mr Brain Wo Contrast 01/25/2017 1 cm acute infarct left lateral thalamus and internal capsule Chronic infarct right cerebellum   Mr Lovenia Kim 01/25/2017 Moderate stenosis left posterior cerebral artery. No other significant intracranial stenosis.   CUS - Findings consistent with a 1- 39 percent stenosis involving the right internal carotid artery and the left internal carotid artery. Left carotid evaluation could be underestimated due to moderate amount of calcified plaque. Bilateral vertebral arteries are patent and antegrade.  TTE - Left ventricle: The cavity size was normal. Wall thickness was normal. Systolic function was normal. The estimated ejection fraction was in the range of 60% to 65%. Wall motion was normal; there were no regional wall motion  abnormalities. Doppler parameters are consistent with abnormal left ventricular relaxation (grade 1 diastolic dysfunction). Impressions: - Technically difficult echo normal LV systolic function grade 1 diastolic dysfunction  Ct Angio Neck W Or Wo Contrast 01/27/2017 IMPRESSION: 1. Right carotid bifurcation moderate calcified plaque with mild 30% proximal ICA stenosis. 2. Left common carotid artery artery origin and long  segment of mid common carotid artery calcified plaque with up to moderate 50% stenosis. 3. Left carotid bifurcation moderate calcified plaque with mild 30% proximal ICA stenosis. 4. Bilateral vertebral artery multiple short segment calcified plaques with mild-to-moderate stenosis. 5. No evidence of dissection or occlusion of the carotid and vertebral arteries of the neck. Atherosclerotic changes and levels of stenosis are stable from the prior CT angiogram of the neck. 6. Mild centrilobular emphysema of lung apices with scattered 2-3 mm pulmonary nodules. No follow-up needed if patient is low-risk (and has no known or suspected primary neoplasm). Non-contrast chest CT can be considered in 12 months if patient is high-risk. This recommendation follows the consensus statement: Guidelines for Management of Incidental Pulmonary Nodules Detected on CT Images: From the Fleischner Society 2017; Radiology 2017; 284:228-243.   Component     Latest Ref Rng & Units 01/26/2017 02/25/2017  Cholesterol     0 - 200 mg/dL 156   Triglycerides     <150 mg/dL 144   HDL Cholesterol     >40 mg/dL 35 (L)   Total CHOL/HDL Ratio     RATIO 4.5   VLDL     0 - 40 mg/dL 29   LDL (calc)     0 - 99 mg/dL 92   Hemoglobin A1C       8.9    Assessment: As you may recall, she is a 71 y.o. African American female with PMH of brain aneurysm, diabetes, hyperlipidemia, hypertension, migraines, PVD s/p fem-pop bypass and stent, TIA admitted on 01/25/17 for small left lateral thalamus and IC infarct as well as old right cerebellar infarct. MRA showed moderate left PCA stenosis and right ICA siphon 34mm aneurysm. CTA neck b/l ICA 30% and left CCA 50% stenosis, b/l VA mild stenosis. EF 60-65%. LDL 92 and A1C 8.9. She has plavix allergy, so continued on ASA and zocor on discharge. Her neurologist Dr. Catalina Gravel put her on aggrenox. However, continue to complain of right side hemiparesthesia.     Plan:  - continue aggrenox and zocor for stroke  prevention - check BP and glucose at home and record - Follow up with your primary care physician for stroke risk factor modification. Recommend maintain blood pressure goal <130/80, diabetes with hemoglobin A1c goal below 7.0% and lipids with LDL cholesterol goal below 70 mg/dL.  - continue to observe right sided numbness, if getting worse may consider gabapentin if needed.  - quit smoking completely.  - using visual assistance for the right hand manipulation  - healthy diet and regular exercise - follow up in 4 months with carolyn.   I spent more than 25 minutes of face to face time with the patient. Greater than 50% of time was spent in counseling and coordination of care. We discussed medications for neuropathic pain, continue aggrenox and statin.    No orders of the defined types were placed in this encounter.   Meds ordered this encounter  Medications  . metFORMIN (GLUCOPHAGE-XR) 500 MG 24 hr tablet    Patient Instructions  - continue aggrenox and zocor for stroke prevention - check BP and glucose at home and record -  Follow up with your primary care physician for stroke risk factor modification. Recommend maintain blood pressure goal <130/80, diabetes with hemoglobin A1c goal below 7.0% and lipids with LDL cholesterol goal below 70 mg/dL.  - continue to observe right sided numbness, if getting worse may consider gabapentin if needed.  - using visual assistance for the right hand manipulation  - healthy diet and regular exercise - follow up in 4 months.    Rosalin Hawking, MD PhD [facility] [address] [place], [place] [address] [phone]

## 2017-04-13 NOTE — Telephone Encounter (Signed)
Rep calling b/c form was faxed to them w/ missing info for vgo pump for the patient. Please resend woith insuranc ena d contact info for patient.  Thank you,  -LL

## 2017-04-13 NOTE — Telephone Encounter (Signed)
Refaxed information to V-go customer care

## 2017-04-16 NOTE — Progress Notes (Signed)
Patient is here with husband to start V-Go 20.  She did not want to start this until she knows what her insurance will pay for this.  We filled out a insurance investigation paper, and I faxed it to [employer].   She was shown how to fill, apply and use the V-go, but she was not receptive to this at this time.  She will wait until she hears from them and will let me know if "it is doable for her.

## 2017-04-21 NOTE — Patient Instructions (Signed)
Call when you hear from [facility], to let me know if you wish to try this for 6 days.

## 2017-05-03 NOTE — Progress Notes (Signed)
Patient ID: [name], female   DOB: 10-Jun-1946, 71 y.o.   MRN: [id number]  Assessment and Plan:    Essential hypertension - continue medications, DASH diet, exercise and monitor at home. Call if greater than 130/80.  -     CBC with Differential/Platelet -     BASIC METABOLIC PANEL WITH GFR -     Hepatic function panel -     TSH  Peripheral vascular disease due to secondary diabetes mellitus ([place]) Discussed general issues about diabetes pathophysiology and management., Educational material distributed., Suggested low cholesterol diet., Encouraged aerobic exercise., Discussed foot care., Reminded to get yearly retinal exam. 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   PVD (peripheral vascular disease) ([facility]) Control blood pressure, cholesterol, glucose, increase exercise.  -     Lipid panel  CKD stage 3 due to type 2 diabetes mellitus ([place]) Discussed general issues about diabetes pathophysiology and management., Educational material distributed., Suggested low cholesterol diet., Encouraged aerobic exercise., Discussed foot care., Reminded to get yearly retinal exam. - following Dr. Dwyane Dee -     BASIC METABOLIC PANEL WITH GFR  Insulin-requiring or dependent type II diabetes mellitus ([place]) Discussed need to try to do morning and night, morning should be higher, not better -     Hemoglobin A1c  Medication management -     Magnesium  Tobacco abuse Trying to quit, cutting back, 1 pack will last 3 days.   Depression, major, recurrent, in partial remission ([place]) - continue medications, stress management techniques discussed, increase water, good sleep hygiene discussed, increase exercise, and increase veggies.   Hyperlipidemia, unspecified hyperlipidemia type -continue medications, check lipids, decrease fatty foods, increase activity.  -     Lipid  panel  Continue diet and meds as discussed. Further disposition pending results of labs. Discussed med's effects and SE's.    Future Appointments Date Time Provider [place]  07/13/2017 9:45 AM Gardiner Barefoot, DPM [facility]  07/15/2017 3:00 PM [facility] 4 [facility]  07/15/2017 3:45 PM Nickel, Sharmon Leyden, NP [facility]  08/10/2017 9:45 AM Dennie Bible, NP [facility] None  09/05/2017 3:00 PM [name], MD [facility] None     HPI 71 y.o. female  presents for 3 month follow up with hypertension, hyperlipidemia, diabetes and vitamin D deficiency.  Her blood pressure has been controlled at home, today their BP is BP: 124/82.She does not workout. She denies chest pain, shortness of breath, dizziness. She has history of PVD s/p fem-pop bypass and stent due to DM2.  Had CVA on 01/25/17 for Right facial droop with Right-sided weakness with decreased sensation. MRI showed small left lateral thalamus and IC infarct as well as old right cerebellar infarct. MRA showed moderate left PCA stenosis and right ICA siphon 22m aneurysm. CTA neck b/l ICA 30% and left CCA 50% stenosis, b/l VA mild stenosis. EF 60-65%.  She has plavix allergy, so continued on ASA and zocor and aggrenox, she is following with neuro.  Still having vomiting and AB pain, has seen GI, did not get EGD.   She is on cholesterol medication and denies myalgias. Her cholesterol is at goal. The cholesterol was:  01/26/2017: Cholesterol 156; HDL 35; LDL Cholesterol 92; Triglycerides 144  She has been working on diet and exercise for diabetes with diabetic chronic kidney disease, with other circulatory complications and with diabetic polyneuropathy, she is  on ASA, she is on ACE/ARB, she follows with Dr. Dwyane Dee, she is on 18 units in the AM and 28-30 at night 70/30 mix BID, could not tolerate invokana due to CKD and denies  foot ulcerations, hypoglycemia , increased appetite, nausea, paresthesia of the feet,  polydipsia, polyuria, visual disturbances, vomiting and weight loss. Last A1C was: 02/25/2017: Hemoglobin A1C 8.9.  Lab Results  Component Value Date   GFRAA 56 (L) 01/31/2017  Patient is on Vitamin D supplement. 07/22/2016: Vit D, 25-Hydroxy 22 BMI is Body mass index is 28.5 kg/m., she is working on diet and exercise. Wt Readings from Last 3 Encounters:  05/04/17 176 lb 9.6 oz (80.1 kg)  04/12/17 181 lb (82.1 kg)  03/04/17 177 lb (80.3 kg)    Current Medications:  Current Outpatient Prescriptions on File Prior to Visit  Medication Sig Dispense Refill  . amLODipine (NORVASC) 10 MG tablet TAKE 1 TABLET (10 MG TOTAL) BY MOUTH DAILY. 90 tablet 1  . b complex vitamins capsule Take 1 capsule by mouth daily.    . Blood Glucose Monitoring Suppl (ACCU-CHEK AVIVA PLUS) W/DEVICE KIT Check blood sugar up to three times daily 1 kit 0  . Cholecalciferol (VITAMIN D3) 5000 UNITS TABS Take 1 capsule by mouth daily.    Marland Kitchen CINNAMON PO Take 1,000 mg by mouth as needed.     . citalopram (CELEXA) 40 MG tablet TAKE 1 TABLET DAILY FOR MOOD & ANXIETY (Patient taking differently: TAKE 1/2 TABLET DAILY FOR MOOD & ANXIETY) 90 tablet 1  . clonazePAM (KLONOPIN) 0.5 MG tablet Take 1/2 to 1 tablet 1 or 2 x/dily only if needed for severe anxiety 180 tablet 0  . dipyridamole-aspirin (AGGRENOX) 200-25 MG 12hr capsule Take 1 capsule by mouth 2 (two) times daily. 180 capsule 1  . fluticasone (FLONASE) 50 MCG/ACT nasal spray Place 2 sprays into both nostrils daily. (Patient taking differently: Place 2 sprays into both nostrils as needed. ) 16 g 0  . glucose blood (ACCU-CHEK AVIVA PLUS) test strip Use daily to check BS TID Dx. E11.22 300 each 1  . insulin NPH-regular Human (NOVOLIN 70/30) (70-30) 100 UNIT/ML injection Inject 25 units in the AM [place] with food, and inject 25 units [place] with evening meal. (Patient taking differently: Inject 20 Units into the skin 2 (two) times daily with a meal. Inject 20 units in the AM [place] with food,  and inject 20 units [place] with evening meal.) 60 mL 4  . Insulin Syringe-Needle U-100 31G X 15/64" 0.5 ML MISC Use two pens daily with insulin 100 each 3  . IRON PO Take by mouth daily.    Marland Kitchen losartan (COZAAR) 100 MG tablet Take 1 tablet (100 mg total) by mouth daily. 90 tablet 2  . Magnesium 500 MG CAPS Take 1 capsule by mouth daily.    . meloxicam (MOBIC) 15 MG tablet Take 1 tablet (15 mg total) by mouth daily. 30 tablet 1  . Omega-3 Fatty Acids (FISH OIL) 1200 MG CAPS Take 1 capsule by mouth daily.    Marland Kitchen omeprazole (PRILOSEC) 40 MG capsule     . simvastatin (ZOCOR) 20 MG tablet TAKE 1 TABLET (20 MG TOTAL) BY MOUTH AT BEDTIME. 90 tablet 2  . vitamin E (VITAMIN E) 400 UNIT capsule Take 400 Units by mouth daily.     No current facility-administered medications on file prior to visit.    Medical History:  Past Medical History:  Diagnosis Date  . Abnormal findings on esophagogastroduodenoscopy (EGD) 07/2010  .  Aneurysm [facility]) 2013   Right Brain   . 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 Anxiety   . Colon polyps   . Diabetes mellitus 1998  . Diverticulosis   . GERD (gastroesophageal reflux disease)   . Hemorrhoids   . Hiatal hernia   . Hyperlipidemia   . Hypertension 2000  . Migraines   . Peripheral vascular disease ([place])   . Phlebitis    30 years ago  left leg  . Status post colonoscopy 07/2010  . Stroke [facility])    multiple mini strokes ( brain aneurysm )   Allergies:  Allergies  Allergen Reactions  . Plavix [Clopidogrel Bisulfate] Palpitations  . Amoxicillin Itching and Swelling    FACE & EYES SWELL  . Ace Inhibitors   . Lyrica [Pregabalin]     Itch, gain weight  . Penicillins Other (See Comments)    REACTION: unspecified     Review of Systems:  Review of Systems  Constitutional: Negative for chills, fever and malaise/fatigue.  HENT: Negative for congestion, ear pain and sore throat.   Eyes: Negative.   Respiratory: Negative for cough, shortness of breath and wheezing.    Cardiovascular: Negative for chest pain, palpitations and leg swelling.  Gastrointestinal: Positive for abdominal pain and vomiting. Negative for blood in stool, constipation, diarrhea, heartburn, melena and nausea.  Genitourinary: Negative.   Skin: Negative.   Neurological: Positive for sensory change (left leg, left face). Negative for dizziness, loss of consciousness and headaches.  Psychiatric/Behavioral: Negative for depression. The patient is not nervous/anxious and does not have insomnia.     Family history- Review and unchanged  Social history- Review and unchanged  Physical Exam: BP 124/82   Pulse 66   Temp (!) 97.5 F (36.4 C)   Resp 16   Ht _0  (1.676 m)   Wt 176 lb 9.6 oz (80.1 kg)   SpO2 97%   BMI 28.50 kg/m  Wt Readings from Last 3 Encounters:  05/04/17 176 lb 9.6 oz (80.1 kg)  04/12/17 181 lb (82.1 kg)  03/04/17 177 lb (80.3 kg)   General Appearance: Well nourished well developed, non-toxic appearing, in no apparent distress. Eyes: PERRLA, EOMs, conjunctiva no swelling or erythema ENT/Mouth: Ear canals clear with no erythema, swelling, or discharge.  TMs normal bilaterally, oropharynx clear, moist, with no exudate.   Neck: Supple, thyroid normal, no JVD, no cervical adenopathy.  Respiratory: Respiratory effort normal, breath sounds clear A&P, no wheeze, rhonchi or rales noted.  No retractions, no accessory muscle usage Cardio: RRR with no MRGs. No noted edema.  Abdomen: Soft, + BS.  Non tender, no guarding, rebound, hernias, masses. Musculoskeletal: Full ROM, 5/5 strength, Normal gait Skin: Warm, dry without rashes, lesions, ecchymosis.  Neuro: Awake and oriented X 3, Cranial nerves intact. No cerebellar symptoms.  Psych: normal affect, Insight and Judgment appropriate.    [name], PA-C 4:43 PM [facility]

## 2017-05-04 ENCOUNTER — Ambulatory Visit (INDEPENDENT_AMBULATORY_CARE_PROVIDER_SITE_OTHER): Payer: Medicare Other | Admitting: Physician Assistant

## 2017-05-04 ENCOUNTER — Encounter: Payer: Self-pay | Admitting: Physician Assistant

## 2017-05-04 ENCOUNTER — Telehealth: Payer: Self-pay | Admitting: Nutrition

## 2017-05-04 VITALS — BP 124/82 | HR 66 | Temp 97.5°F | Resp 16 | Ht 66.0 in | Wt 176.6 lb

## 2017-05-04 DIAGNOSIS — I739 Peripheral vascular disease, unspecified: Secondary | ICD-10-CM

## 2017-05-04 DIAGNOSIS — E1122 Type 2 diabetes mellitus with diabetic chronic kidney disease: Secondary | ICD-10-CM | POA: Diagnosis not present

## 2017-05-04 DIAGNOSIS — Z79899 Other long term (current) drug therapy: Secondary | ICD-10-CM | POA: Diagnosis not present

## 2017-05-04 DIAGNOSIS — E785 Hyperlipidemia, unspecified: Secondary | ICD-10-CM | POA: Diagnosis not present

## 2017-05-04 DIAGNOSIS — I63332 Cerebral infarction due to thrombosis of left posterior cerebral artery: Secondary | ICD-10-CM | POA: Diagnosis not present

## 2017-05-04 DIAGNOSIS — Z720 Tobacco use: Secondary | ICD-10-CM | POA: Diagnosis not present

## 2017-05-04 DIAGNOSIS — E119 Type 2 diabetes mellitus without complications: Secondary | ICD-10-CM | POA: Diagnosis not present

## 2017-05-04 DIAGNOSIS — E1351 Other specified diabetes mellitus with diabetic peripheral angiopathy without gangrene: Secondary | ICD-10-CM

## 2017-05-04 DIAGNOSIS — Z794 Long term (current) use of insulin: Secondary | ICD-10-CM

## 2017-05-04 DIAGNOSIS — N183 Chronic kidney disease, stage 3 unspecified: Secondary | ICD-10-CM

## 2017-05-04 DIAGNOSIS — I779 Disorder of arteries and arterioles, unspecified: Secondary | ICD-10-CM | POA: Diagnosis not present

## 2017-05-04 DIAGNOSIS — F3341 Major depressive disorder, recurrent, in partial remission: Secondary | ICD-10-CM | POA: Diagnosis not present

## 2017-05-04 DIAGNOSIS — I10 Essential (primary) hypertension: Secondary | ICD-10-CM | POA: Diagnosis not present

## 2017-05-04 NOTE — Patient Instructions (Addendum)
Normally higher insulin doses in the morning than at night, so check on the doses.   Somogyi effect The brain needs two things: oxygen and sugar. If the blood sugar level drops too low in the early morning hours, hormones (such as growth hormone, cortisol, and catecholamines) are released to make sure you brain can still function. These help reverse the low blood sugar level but may lead to blood sugar levels that are higher than normal in the morning. This is common for patient that take insulin at night or do not ear regular snacks.  Please schedule to get up in the middle of the night to check your blood sugar.   This may be happening to you. Please eat a high protein night time snack and we will be decreasing your night time insulin as follows:    Your A1C is a measure of your sugar over the past 3 months and is not affected by what you have eaten over the past few days. Diabetes increases your chances of stroke and heart attack over 300 % and is the leading cause of blindness and kidney failure in the [place]. Please make sure you decrease bad carbs like white bread, white rice, potatoes, corn, soft drinks, pasta, cereals, refined sugars, sweet tea, dried fruits, and fruit juice. Good carbs are okay to eat in moderation like sweet potatoes, brown rice, whole grain pasta/bread, most fruit (except dried fruit) and you can eat as many veggies as you want.   Greater than 6.5 is considered diabetic. Between 6.4 and 5.7 is prediabetic If your A1C is less than 5.7 you are NOT diabetic.  Targets for Glucose Readings: Time of Check Target for patients WITHOUT Diabetes Target for DIABETICS  Before Meals Less than 100  less than 150  Two hours after meals Less than 200  Less than 250    . If at any time you start to have low blood sugars in the morning or during the day please decrease your insulin by 2-3 units. Please never take this medication if you are sick or can not eat. A low blood sugar is  much more dangerous than a high blood sugar. Your brain needs two things, sugar and oxygen.    Call GI doctor and let them know you are still having AB pain and vomiting May need EGD

## 2017-05-04 NOTE — Telephone Encounter (Signed)
Pt. Can not afford the $75.00/month.  I will try to get a prior auth. From a DME to her 2nd insurance to see if it will be less. She is wanting to know if she can go on a GLP1 medication she saw on TV that is once a week, to help her loose weight?

## 2017-05-04 NOTE — Telephone Encounter (Signed)
Patient called saying she has not heard from [employer] about coverage

## 2017-05-04 NOTE — Telephone Encounter (Signed)
She cannot take a GLP-1 drug until she has controlled her nausea and diarrhea symptoms

## 2017-05-05 LAB — CBC WITH DIFFERENTIAL/PLATELET
Basophils Absolute: 71 cells/uL (ref 0–200)
Basophils Relative: 0.8 %
Eosinophils Absolute: 98 cells/uL (ref 15–500)
Eosinophils Relative: 1.1 %
HCT: 45.4 % — ABNORMAL HIGH (ref 35.0–45.0)
Hemoglobin: 15 g/dL (ref 11.7–15.5)
Lymphs Abs: 3097 cells/uL (ref 850–3900)
MCH: 31 pg (ref 27.0–33.0)
MCHC: 33 g/dL (ref 32.0–36.0)
MCV: 93.8 fL (ref 80.0–100.0)
MPV: 11 fL (ref 7.5–12.5)
Monocytes Relative: 7.6 %
NEUTROS PCT: 55.7 %
Neutro Abs: 4957 cells/uL (ref 1500–7800)
PLATELETS: 309 10*3/uL (ref 140–400)
RBC: 4.84 10*6/uL (ref 3.80–5.10)
RDW: 11.6 % (ref 11.0–15.0)
TOTAL LYMPHOCYTE: 34.8 %
WBC: 8.9 10*3/uL (ref 3.8–10.8)
WBCMIX: 676 {cells}/uL (ref 200–950)

## 2017-05-05 LAB — HEMOGLOBIN A1C
HEMOGLOBIN A1C: 10.5 %{Hb} — AB (ref <5.7)
Mean Plasma Glucose: 255 (calc)
eAG (mmol/L): 14.1 (calc)

## 2017-05-05 LAB — HEPATIC FUNCTION PANEL
AG Ratio: 1.2 (calc) (ref 1.0–2.5)
ALBUMIN MSPROF: 3.6 g/dL (ref 3.6–5.1)
ALT: 13 U/L (ref 6–29)
AST: 21 U/L (ref 10–35)
Alkaline phosphatase (APISO): 119 U/L (ref 33–130)
BILIRUBIN DIRECT: 0.1 mg/dL (ref 0.0–0.2)
BILIRUBIN TOTAL: 0.4 mg/dL (ref 0.2–1.2)
Globulin: 3 g/dL (calc) (ref 1.9–3.7)
Indirect Bilirubin: 0.3 mg/dL (calc) (ref 0.2–1.2)
Total Protein: 6.6 g/dL (ref 6.1–8.1)

## 2017-05-05 LAB — LIPID PANEL
CHOL/HDL RATIO: 3.6 (calc) (ref <5.0)
Cholesterol: 213 mg/dL — ABNORMAL HIGH (ref <200)
HDL: 59 mg/dL (ref >50)
LDL CHOLESTEROL (CALC): 125 mg/dL — AB
Non-HDL Cholesterol (Calc): 154 mg/dL (calc) — ABNORMAL HIGH (ref <130)
TRIGLYCERIDES: 177 mg/dL — AB (ref <150)

## 2017-05-05 LAB — BASIC METABOLIC PANEL WITH GFR
BUN / CREAT RATIO: 13 (calc) (ref 6–22)
BUN: 17 mg/dL (ref 7–25)
CO2: 28 mmol/L (ref 20–32)
CREATININE: 1.3 mg/dL — AB (ref 0.60–0.93)
Calcium: 9.5 mg/dL (ref 8.6–10.4)
Chloride: 100 mmol/L (ref 98–110)
GFR, Est African American: 48 mL/min/{1.73_m2} — ABNORMAL LOW (ref >=60)
GFR, Est Non African American: 42 mL/min/{1.73_m2} — ABNORMAL LOW (ref >=60)
GLUCOSE: 306 mg/dL — AB (ref 65–99)
Potassium: 4.4 mmol/L (ref 3.5–5.3)
Sodium: 137 mmol/L (ref 135–146)

## 2017-05-05 LAB — MAGNESIUM: Magnesium: 1.9 mg/dL (ref 1.5–2.5)

## 2017-05-05 LAB — TSH: TSH: 1.47 mIU/L (ref 0.40–4.50)

## 2017-05-09 NOTE — Telephone Encounter (Signed)
Called and left message as per D.r Kumar's note below.

## 2017-05-28 ENCOUNTER — Other Ambulatory Visit: Payer: Self-pay | Admitting: Internal Medicine

## 2017-05-28 ENCOUNTER — Other Ambulatory Visit: Payer: Self-pay | Admitting: Physician Assistant

## 2017-05-28 DIAGNOSIS — E782 Mixed hyperlipidemia: Secondary | ICD-10-CM

## 2017-05-28 MED ORDER — SIMVASTATIN 20 MG PO TABS: ORAL_TABLET | ORAL | 2 refills | Status: DC

## 2017-07-04 ENCOUNTER — Ambulatory Visit (INDEPENDENT_AMBULATORY_CARE_PROVIDER_SITE_OTHER): Payer: Medicare Other | Admitting: Physician Assistant

## 2017-07-04 ENCOUNTER — Encounter: Payer: Self-pay | Admitting: Physician Assistant

## 2017-07-04 VITALS — BP 130/80 | HR 77 | Temp 98.8°F | Resp 16 | Ht 66.0 in | Wt 182.4 lb

## 2017-07-04 DIAGNOSIS — N183 Chronic kidney disease, stage 3 unspecified: Secondary | ICD-10-CM

## 2017-07-04 DIAGNOSIS — Z23 Encounter for immunization: Secondary | ICD-10-CM | POA: Diagnosis not present

## 2017-07-04 DIAGNOSIS — R350 Frequency of micturition: Secondary | ICD-10-CM

## 2017-07-04 DIAGNOSIS — E119 Type 2 diabetes mellitus without complications: Secondary | ICD-10-CM | POA: Diagnosis not present

## 2017-07-04 DIAGNOSIS — Z794 Long term (current) use of insulin: Secondary | ICD-10-CM

## 2017-07-04 DIAGNOSIS — E1122 Type 2 diabetes mellitus with diabetic chronic kidney disease: Secondary | ICD-10-CM | POA: Diagnosis not present

## 2017-07-04 DIAGNOSIS — I10 Essential (primary) hypertension: Secondary | ICD-10-CM

## 2017-07-04 NOTE — Patient Instructions (Addendum)
Try to exercise- start on a bike or if you can get in a pool start out 20 mins a day for 3 days a week  Call your insurance and see what DM medications they cover Specifically ask about Bydrueon Bcise  Get on cinammon twice day  Do better breakfast Drink 80-100 oz a day of water, measure it out  have to do breakfast, eat protein- hard boiled eggs, protein bar like nature valley protein bar, greek yogurt like oikos triple zero, chobani 100, or light n fit greek  Somogyi effect The brain needs two things: oxygen and sugar. If the blood sugar level drops too low in the early morning hours, hormones (such as growth hormone, cortisol, and catecholamines) are released to make sure you brain can still function. These help reverse the low blood sugar level but may lead to blood sugar levels that are higher than normal in the morning. This is common for patient that take insulin at night or do not ear regular snacks.  Please schedule to get up in the middle of the night to check your blood sugar.   This may be happening to you. Please eat a high protein night time snack.    About Cystocele  Overview  The pelvic organs, including the bladder, are normally supported by pelvic floor muscles and ligaments.  When these muscles and ligaments are stretched, weakened or torn, the wall between the bladder and the vagina sags or herniates causing a prolapse, sometimes called a cystocele.  This condition may cause discomfort and problems with emptying the bladder.  It can be present in various stages.  Some people are not aware of the changes.  Others may notice changes at the vaginal opening or a feeling of the bladder dropping outside the body.  Causes of a Cystocele  A cystocele is usually caused by muscle straining or stretching during childbirth.  In addition, cystocele is more common after menopause, because the hormone estrogen helps keep the elastic tissues around the pelvic organs strong.  A  cystocele is more likely to occur when levels of estrogen decrease.  Other causes include: heavy lifting, chronic coughing, previous pelvic surgery and obesity.  Symptoms  A bladder that has dropped from its normal position may cause: unwanted urine leakage (stress incontinence), frequent urination or urge to urinate, incomplete emptying of the bladder (not feeling bladder relief after emptying), pain or discomfort in the vagina, pelvis, groin, lower back or lower abdomen and frequent urinary tract infections.  Mild cases may not cause any symptoms.  Treatment Options  Pelvic floor (Kegel) exercises:  Strength training the muscles in your genital area  Behavioral changes: Treating and preventing constipation, taking time to empty your bladder properly, learning to lift properly and/or avoid heavy lifting when possible, stopping smoking, avoiding weight gain and treating a chronic cough or bronchitis.  A pessary: A vaginal support device is sometimes used to help pelvic support caused by muscle and ligament changes.  Surgery: Surgical repair may be necessary if symptoms cannot be managed with exercise, behavioral changes and a pessary.  Surgery is usually considered for severe cases.   2007, [employer]

## 2017-07-04 NOTE — Progress Notes (Signed)
Diabetes Education and Follow-Up Visit  71 y.o.female presents for diabetic education. She has Diabetes Mellitus type 2:  with CKD stage 3 GFR 30-59, CAD, PVD on insulin.  Patient denies polydipsia and visual disturbances. The patient is on a low dose aspirin at this time. She has been following with Dr. Dwyane Dee however she states he wanted her to be on a pump and it was too expensive.  She has been having urinary incontinence, peeing every 30 mins, having some pressure, going to see [facility].  Last hemoglobin A1c was: Lab Results  Component Value Date   HGBA1C 10.5 (H) 05/04/2017   HGBA1C 8.9 02/25/2017   HGBA1C 10.9 (H) 11/10/2016    Pt is on a regimen of: Novolog 70/30 she switched recently to 30 in the AM and 20 at night  Pt checks her sugars 2 x day  Lowest sugar was 69.  She has hypoglycemia awareness.  Highest sugar was 342  Glucometer: [employer] meter  Exercise: She does not exercise due to leg pain She is still smoking but has cut down significantly, she is only smoking 5-6 cigs a day.   Patient does have CKD She is on ACE/ARB  Lab Results  Component Value Date   GFRAA 48 (L) 05/04/2017   Lab Results  Component Value Date   CREATININE 1.30 (H) 05/04/2017   BUN 17 05/04/2017   NA 137 05/04/2017   K 4.4 05/04/2017   CL 100 05/04/2017   CO2 28 05/04/2017   Lab Results  Component Value Date   MICROALBUR 75.3 07/22/2016    She is on a Statin.  Lab Results  Component Value Date   CHOL 213 (H) 05/04/2017   HDL 59 05/04/2017   LDLCALC 92 01/26/2017   LDLDIRECT 146.4 01/30/2010   TRIG 177 (H) 05/04/2017   CHOLHDL 3.6 05/04/2017     Problem List has Nontoxic multinodular goiter; CKD stage 3 due to type 2 diabetes mellitus ([place]); Glaucoma; Essential hypertension; Brain aneurysm; ESOPHAGEAL STRICTURE; Esophageal reflux; Diverticulosis of large intestine; History of colonic polyps; Atherosclerosis of native arteries of extremity with intermittent claudication;  Peripheral vascular disease due to secondary diabetes mellitus ([place]); Vitamin D deficiency; Medication management; Cerebral infarction ([place]); Tobacco abuse; Generalized anxiety disorder; Depression, major, recurrent, in partial remission ([place]); Encounter for Medicare annual wellness exam; Hyperlipidemia, mixed; Insulin-requiring or dependent type II diabetes mellitus ([place]); Cerebrovascular accident (CVA) due to thrombosis of left posterior cerebral artery ([place]); and PVD (peripheral vascular disease) ([place]) on her problem list.  Medications Current Outpatient Prescriptions on File Prior to Visit  Medication Sig  . amLODipine (NORVASC) 10 MG tablet TAKE 1 TABLET (10 MG TOTAL) BY MOUTH DAILY.  Marland Kitchen b complex vitamins capsule Take 1 capsule by mouth daily.  . Blood Glucose Monitoring Suppl (ACCU-CHEK AVIVA PLUS) W/DEVICE KIT Check blood sugar up to three times daily  . Cholecalciferol (VITAMIN D3) 5000 UNITS TABS Take 1 capsule by mouth daily.  Marland Kitchen CINNAMON PO Take 1,000 mg by mouth as needed.   . citalopram (CELEXA) 40 MG tablet TAKE 1 TABLET DAILY FOR MOOD & ANXIETY (Patient taking differently: TAKE 1/2 TABLET DAILY FOR MOOD & ANXIETY)  . fluticasone (FLONASE) 50 MCG/ACT nasal spray Place 2 sprays into both nostrils daily. (Patient taking differently: Place 2 sprays into both nostrils as needed. )  . glucose blood (ACCU-CHEK AVIVA PLUS) test strip Use daily to check BS TID Dx. E11.22  . Insulin Syringe-Needle U-100 31G X 15/64" 0.5 ML MISC Use two  pens daily with insulin  . IRON PO Take by mouth daily.  Marland Kitchen losartan (COZAAR) 100 MG tablet Take 1 tablet (100 mg total) by mouth daily.  . Magnesium 500 MG CAPS Take 1 capsule by mouth daily.  . meloxicam (MOBIC) 15 MG tablet Take 1 tablet (15 mg total) by mouth daily.  Marland Kitchen NOVOLIN 70/30 (70-30) 100 UNIT/ML injection INJECT 25 UNITS IN THE AM [place] WITH FOOD, AND INJECT 25 UNITS [place] WITH EVENING MEAL.  Marland Kitchen Omega-3 Fatty Acids (FISH OIL) 1200 MG CAPS Take 1 capsule by  mouth daily.  Marland Kitchen omeprazole (PRILOSEC) 40 MG capsule   . simvastatin (ZOCOR) 20 MG tablet Take 1 tablet at Bedtime for Cholesterol  . vitamin E (VITAMIN E) 400 UNIT capsule Take 400 Units by mouth daily.  . clonazePAM (KLONOPIN) 0.5 MG tablet Take 1/2 to 1 tablet 1 or 2 x/dily only if needed for severe anxiety   No current facility-administered medications on file prior to visit.     Review of Systems  Constitutional: Negative.   HENT: Negative.   Eyes: Negative.   Respiratory: Negative.   Cardiovascular: Negative.   Gastrointestinal: Negative.   Genitourinary: Negative.   Musculoskeletal: Negative.   Skin: Negative.   Neurological: Negative.   Endo/Heme/Allergies: Negative.   Psychiatric/Behavioral: Negative.     Physical Exam: Blood pressure 130/80, pulse 77, temperature 98.8 F (37.1 C), resp. rate 16, height 5' 6"  (1.676 m), weight 182 lb 6.4 oz (82.7 kg), SpO2 96 %. Body mass index is 29.44 kg/m. General Appearance: Well nourished, in no apparent distress. Eyes: PERRLA, EOMs, conjunctiva no swelling or erythema ENT/Mouth: Ext aud canals clear, TMs without erythema, bulging. No erythema, swelling, or exudate on post pharynx.  Tonsils not swollen or erythematous. Hearing normal.  Respiratory: Respiratory effort normal, BS equal bilaterally without rales, rhonchi, wheezing or stridor.  Cardio: RRR with no MRGs. Brisk peripheral pulses without edema.  Abdomen: Soft, + BS.  Non tender, no guarding, rebound, hernias, masses. Musculoskeletal: Full ROM, 5/5 strength, normal gait.  Skin: Warm, dry without rashes, lesions, ecchymosis.  Neuro: Cranial nerves intact. Normal muscle tone, no cerebellar symptoms. Sensation intact.    Plan and Assessment: Diabetes Education: Reviewed 'ABCs' of diabetes management (respective goals in parentheses):  A1C (<7), blood pressure (<130/80), and cholesterol (LDL <70) Eye Exam yearly and Dental Exam every 6 months. Dietary  recommendations Physical Activity recommendations - Strongly advised her to start checking sugars at different times of the day - check 2 times a day, rotating checks - given sugar log and advised how to fill it and to bring it at next appt  - given foot care handout and explained the principles  - given instructions for hypoglycemia management  - continue insulin the same for now 30/20, keep food diary - add on cinnamon 1070m BID - check about Bcise  Future Appointments Date Time Provider DMount Crawford 07/13/2017 9:45 AM MGardiner Barefoot DPM [facility]  07/15/2017 3:00 PM [facility] 4 [facility]  07/15/2017 3:45 PM Nickel, SSharmon Leyden NP [facility]  08/10/2017 9:45 AM MDennie Bible NP [facility] None  09/05/2017 3:00 PM MUnk Pinto MD [facility] None

## 2017-07-05 ENCOUNTER — Other Ambulatory Visit: Payer: Self-pay | Admitting: Physician Assistant

## 2017-07-05 DIAGNOSIS — R801 Persistent proteinuria, unspecified: Secondary | ICD-10-CM

## 2017-07-05 LAB — URINALYSIS, ROUTINE W REFLEX MICROSCOPIC
BACTERIA UA: NONE SEEN /HPF
Bilirubin Urine: NEGATIVE
HGB URINE DIPSTICK: NEGATIVE
HYALINE CAST: NONE SEEN /LPF
Ketones, ur: NEGATIVE
Leukocytes, UA: NEGATIVE
Nitrite: NEGATIVE
RBC / HPF: NONE SEEN /HPF (ref 0–2)
SQUAMOUS EPITHELIAL / LPF: NONE SEEN /HPF (ref <=5)
Specific Gravity, Urine: 1.028 (ref 1.001–1.03)
pH: 5.5 (ref 5.0–8.0)

## 2017-07-06 LAB — URINE CULTURE
MICRO NUMBER:: 81147620
SPECIMEN QUALITY: ADEQUATE

## 2017-07-08 ENCOUNTER — Other Ambulatory Visit: Payer: Self-pay | Admitting: Physician Assistant

## 2017-07-13 ENCOUNTER — Ambulatory Visit (INDEPENDENT_AMBULATORY_CARE_PROVIDER_SITE_OTHER): Payer: Medicare Other | Admitting: Podiatry

## 2017-07-13 ENCOUNTER — Encounter: Payer: Self-pay | Admitting: Podiatry

## 2017-07-13 DIAGNOSIS — E1149 Type 2 diabetes mellitus with other diabetic neurological complication: Secondary | ICD-10-CM

## 2017-07-13 DIAGNOSIS — M79676 Pain in unspecified toe(s): Secondary | ICD-10-CM | POA: Diagnosis not present

## 2017-07-13 DIAGNOSIS — M2041 Other hammer toe(s) (acquired), right foot: Secondary | ICD-10-CM

## 2017-07-13 DIAGNOSIS — I739 Peripheral vascular disease, unspecified: Secondary | ICD-10-CM

## 2017-07-13 DIAGNOSIS — B351 Tinea unguium: Secondary | ICD-10-CM

## 2017-07-13 DIAGNOSIS — M2042 Other hammer toe(s) (acquired), left foot: Secondary | ICD-10-CM

## 2017-07-13 NOTE — Progress Notes (Signed)
Patient ID: [name], female   DOB: 07-Nov-1945, 71 y.o.   MRN: [id number] Complaint:  Visit Type: Patient returns to my office for continued preventative foot care services. Complaint: Patient states" my nails have grown long and thick and become painful to walk and wear shoes" Patient has been diagnosed with DM with neuropathy.. The patient presents for preventative foot care services. No changes to ROS  Podiatric Exam: Vascular: dorsalis pedis and posterior tibial pulses are palpable bilateral. Capillary return is immediate. Temperature gradient is WNL. Skin turgor WNL  Sensorium: Diminished Semmes Weinstein monofilament test. Normal tactile sensation bilaterally. Nail Exam: Pt has thick disfigured discolored nails with subungual debris noted bilateral entire nail hallux through fifth toenails Ulcer Exam: There is no evidence of ulcer or pre-ulcerative changes or infection. Orthopedic Exam: Muscle tone and strength are WNL. No limitations in general ROM. No crepitus or effusions noted. Foot type and digits show no abnormalities. Bony prominences are unremarkable. Hammer toes B/L with plantarflexed metatarsals B/L.  Adductovarus fifth toe right foot. Skin: No Porokeratosis. No infection or ulcers.  Listers corn noted fifth toe right foot. Diagnosis:  Onychomycosis, , Pain in right toe, pain in left toes  Treatment & Plan Procedures and Treatment: Consent by patient was obtained for treatment procedures. The patient understood the discussion of treatment and procedures well. All questions were answered thoroughly reviewed. Debridement of mycotic and hypertrophic toenails, 1 through 5 bilateral and clearing of subungual debris. No ulceration, no infection noted.  Debride listers corn. ABN signed for 2018. Return Visit-Office Procedure: Patient instructed to return to the office for a follow up visit 3 months for continued evaluation and treatment.    Gardiner Barefoot DPM

## 2017-07-15 ENCOUNTER — Ambulatory Visit: Payer: Medicare Other | Admitting: Family

## 2017-07-15 ENCOUNTER — Encounter (HOSPITAL_COMMUNITY): Payer: Medicare Other

## 2017-07-23 ENCOUNTER — Emergency Department (HOSPITAL_COMMUNITY)
Admission: EM | Admit: 2017-07-23 | Discharge: 2017-07-23 | Disposition: A | Discharge status: Home / Self Care | Payer: Medicare Other | Attending: Emergency Medicine | Admitting: Emergency Medicine

## 2017-07-23 ENCOUNTER — Encounter (HOSPITAL_COMMUNITY): Payer: Self-pay | Admitting: Emergency Medicine

## 2017-07-23 DIAGNOSIS — R319 Hematuria, unspecified: Secondary | ICD-10-CM | POA: Diagnosis not present

## 2017-07-23 DIAGNOSIS — R1030 Lower abdominal pain, unspecified: Secondary | ICD-10-CM | POA: Diagnosis not present

## 2017-07-23 DIAGNOSIS — Z7982 Long term (current) use of aspirin: Secondary | ICD-10-CM | POA: Diagnosis not present

## 2017-07-23 DIAGNOSIS — R109 Unspecified abdominal pain: Secondary | ICD-10-CM | POA: Diagnosis present

## 2017-07-23 DIAGNOSIS — Z79899 Other long term (current) drug therapy: Secondary | ICD-10-CM | POA: Insufficient documentation

## 2017-07-23 DIAGNOSIS — E1122 Type 2 diabetes mellitus with diabetic chronic kidney disease: Secondary | ICD-10-CM | POA: Insufficient documentation

## 2017-07-23 DIAGNOSIS — Z794 Long term (current) use of insulin: Secondary | ICD-10-CM | POA: Diagnosis not present

## 2017-07-23 DIAGNOSIS — N390 Urinary tract infection, site not specified: Secondary | ICD-10-CM | POA: Diagnosis not present

## 2017-07-23 DIAGNOSIS — F17210 Nicotine dependence, cigarettes, uncomplicated: Secondary | ICD-10-CM | POA: Diagnosis not present

## 2017-07-23 DIAGNOSIS — Z8673 Personal history of transient ischemic attack (TIA), and cerebral infarction without residual deficits: Secondary | ICD-10-CM | POA: Diagnosis not present

## 2017-07-23 DIAGNOSIS — I129 Hypertensive chronic kidney disease with stage 1 through stage 4 chronic kidney disease, or unspecified chronic kidney disease: Secondary | ICD-10-CM | POA: Diagnosis not present

## 2017-07-23 DIAGNOSIS — N183 Chronic kidney disease, stage 3 (moderate): Secondary | ICD-10-CM | POA: Diagnosis not present

## 2017-07-23 LAB — I-STAT CHEM 8, ED
BUN: 16 mg/dL (ref 6–20)
CALCIUM ION: 1.14 mmol/L — AB (ref 1.15–1.40)
CHLORIDE: 98 mmol/L — AB (ref 101–111)
Creatinine, Ser: 1.3 mg/dL — ABNORMAL HIGH (ref 0.44–1.00)
GLUCOSE: 291 mg/dL — AB (ref 65–99)
HEMATOCRIT: 44 % (ref 36.0–46.0)
HEMOGLOBIN: 15 g/dL (ref 12.0–15.0)
Potassium: 4.2 mmol/L (ref 3.5–5.1)
SODIUM: 138 mmol/L (ref 135–145)
TCO2: 30 mmol/L (ref 22–32)

## 2017-07-23 LAB — URINALYSIS, ROUTINE W REFLEX MICROSCOPIC
Bilirubin Urine: NEGATIVE
Glucose, UA: 150 mg/dL — AB
Ketones, ur: NEGATIVE mg/dL
Nitrite: NEGATIVE
PROTEIN: 100 mg/dL — AB
SPECIFIC GRAVITY, URINE: 1.005 (ref 1.005–1.030)
pH: 6 (ref 5.0–8.0)

## 2017-07-23 LAB — CBC WITH DIFFERENTIAL/PLATELET
BASOS PCT: 0 %
Basophils Absolute: 0 10*3/uL (ref 0.0–0.1)
Eosinophils Absolute: 0.1 10*3/uL (ref 0.0–0.7)
Eosinophils Relative: 1 %
HEMATOCRIT: 43.9 % (ref 36.0–46.0)
HEMOGLOBIN: 14.3 g/dL (ref 12.0–15.0)
LYMPHS ABS: 2.7 10*3/uL (ref 0.7–4.0)
Lymphocytes Relative: 22 %
MCH: 31.5 pg (ref 26.0–34.0)
MCHC: 32.6 g/dL (ref 30.0–36.0)
MCV: 96.7 fL (ref 78.0–100.0)
MONOS PCT: 6 %
Monocytes Absolute: 0.8 10*3/uL (ref 0.1–1.0)
NEUTROS ABS: 9 10*3/uL — AB (ref 1.7–7.7)
NEUTROS PCT: 71 %
Platelets: 270 10*3/uL (ref 150–400)
RBC: 4.54 MIL/uL (ref 3.87–5.11)
RDW: 12.3 % (ref 11.5–15.5)
WBC: 12.6 10*3/uL — AB (ref 4.0–10.5)

## 2017-07-23 MED ORDER — CIPROFLOXACIN HCL 500 MG PO TABS: 500.0000 mg | ORAL_TABLET | Freq: Two times a day (BID) | ORAL | 0 refills | Status: AC

## 2017-07-23 NOTE — ED Notes (Signed)
Patient given discharge instructions and verbalized understanding.  Patient stable to discharge at this time.  Patient is alert and oriented to baseline.  No distressed noted at this time.  All belongings taken with the patient at discharge.   

## 2017-07-23 NOTE — ED Triage Notes (Signed)
Pt. Stated,  I've had stomach pressure for months. I started having blood in my urine today.  I also hurt in the back area.

## 2017-07-23 NOTE — ED Provider Notes (Signed)
[facility] EMERGENCY DEPARTMENT Provider Note   CSN: [id number] Arrival date & time: 07/23/17  1326     History   Chief Complaint Chief Complaint  Patient presents with  . Abdominal Pain  . Hematuria    HPI [name] is a 71 y.o. female.  HPI Patient presents with abdominal pain and hematuria.  States that she has had chronic abdominal pain for months and is seeing GI for.  States she had a CT scan but did not have the endoscopy like her primary care doctor wanted so she will talk to GI again.  Starting last night she has had more urinary frequency and saw some hematuria this morning.  Her abdominal pain is otherwise the same.  No flank pain.  No real nausea or vomiting.  States she had a previous hernia repair to have mesh on her abdomen. Past Medical History:  Diagnosis Date  . Abnormal findings on esophagogastroduodenoscopy (EGD) 07/2010  . Aneurysm [facility]) 2013   Right Brain   . Aneurysm ([place])    in brain x 2  . Anxiety   . Colon polyps   . Diabetes mellitus 1998  . Diverticulosis   . GERD (gastroesophageal reflux disease)   . Hemorrhoids   . Hiatal hernia   . Hyperlipidemia   . Hypertension 2000  . Migraines   . Peripheral vascular disease ([place])   . Phlebitis    30 years ago  left leg  . Status post colonoscopy 07/2010  . Stroke [facility])    multiple mini strokes ( brain aneurysm )    Patient Active Problem List   Diagnosis Date Noted  . PVD (peripheral vascular disease) ([place]) 04/13/2017  . Cerebrovascular accident (CVA) due to thrombosis of left posterior cerebral artery ([place]) 01/25/2017  . Hyperlipidemia, mixed 11/07/2015  . Insulin-requiring or dependent type II diabetes mellitus ([place]) 11/07/2015  . Encounter for Medicare annual wellness exam 06/24/2015  . Generalized anxiety disorder 03/26/2015  . Depression, major, recurrent, in partial remission ([place]) 03/26/2015  . Cerebral infarction ([place]) 10/04/2014  . Tobacco abuse 10/04/2014    . Vitamin D deficiency 11/02/2013  . Medication management 11/02/2013  . Peripheral vascular disease due to secondary diabetes mellitus ([place]) 05/08/2012  . Atherosclerosis of native arteries of extremity with intermittent claudication 12/24/2011  . Diverticulosis of large intestine 06/09/2010  . History of colonic polyps 06/09/2010  . ESOPHAGEAL STRICTURE 08/27/2008  . Esophageal reflux 08/27/2008  . Nontoxic multinodular goiter 03/11/2008  . Glaucoma 03/11/2008  . Brain aneurysm 03/11/2008  . CKD stage 3 due to type 2 diabetes mellitus ([place]) 05/01/2007  . Essential hypertension 05/01/2007    Past Surgical History:  Procedure Laterality Date  . ABDOMINAL AORTAGRAM N/A 01/04/2012   Procedure: ABDOMINAL Maxcine Ham;  Surgeon: Serafina Mitchell, MD;  Location: [facility] CATH LAB;  Service: Cardiovascular;  Laterality: N/A;  . ABDOMINAL AORTAGRAM N/A 08/15/2012   Procedure: ABDOMINAL Maxcine Ham;  Surgeon: Serafina Mitchell, MD;  Location: [facility] CATH LAB;  Service: Cardiovascular;  Laterality: N/A;  . ABDOMINAL HYSTERECTOMY  1984  . cataract surgery Right 10-22-2015  . cataract surgery Left 11-05-2015  . CESAREAN SECTION    . CHOLECYSTECTOMY    . COLONOSCOPY  07/2010  . DENTAL SURGERY  Aug. 16, 2013   left lower   . ESOPHAGOGASTRODUODENOSCOPY  07/2010  . EYE SURGERY  Nov. 2014   Laser-Glaucoma  . FEMORAL ARTERY STENT  05/11/11   Left superficial femoral and popliteal artery  . FEMORAL-POPLITEAL BYPASS  GRAFT  Nov. 26, 2013   Left Angiogram-   . HERNIA REPAIR     times two  . KNEE SURGERY    . LOWER EXTREMITY ANGIOGRAM Left 08/15/2012   Procedure: LOWER EXTREMITY ANGIOGRAM;  Surgeon: Serafina Mitchell, MD;  Location: [facility] CATH LAB;  Service: Cardiovascular;  Laterality: Left;  lt leg angio  . rotator cuff surgery    . THYROID SURGERY      OB History    No data available       Home Medications    Prior to Admission medications   Medication Sig Start Date End Date Taking? Authorizing  Provider  amLODipine (NORVASC) 10 MG tablet TAKE 1 TABLET (10 MG TOTAL) BY MOUTH DAILY. 02/06/17  Yes [name], MD  aspirin EC 81 MG tablet Take 81 mg by mouth daily.   Yes [provider]  b complex vitamins capsule Take 1 capsule by mouth daily.   Yes [provider]  CINNAMON PO Take 1,000 mg by mouth 2 (two) times daily.    Yes [provider]  citalopram (CELEXA) 40 MG tablet TAKE 1 TABLET DAILY FOR MOOD & ANXIETY Patient taking differently: take one  31m TABLET DAILY FOR MOOD & ANXIETY 01/17/17  Yes MUnk Pinto MD  clonazePAM (KLONOPIN) 0.5 MG tablet Take 1/2 to 1 tablet 1 or 2 x/dily only if needed for severe anxiety Patient taking differently: Take 0.25 mg by mouth at bedtime. Take 1/2 to 1 tablet 1 or 2 x/dily only if needed for severe anxiety 03/31/17 07/23/17 Yes MUnk Pinto MD  fluticasone ([facility] 50 MCG/ACT nasal spray Place 2 sprays into both nostrils daily. Patient taking differently: Place 2 sprays into both nostrils as needed for allergies.  08/09/16  Yes Forcucci, Courtney, PA-C  losartan (COZAAR) 100 MG tablet Take 1 tablet (100 mg total) by mouth daily. 02/03/17 02/03/18 Yes Abrol, NAscencion Dike MD  NOVOLIN 70/30 (70-30) 100 UNIT/ML injection INJECT 25 UNITS IN THE AM [place] WITH FOOD, AND INJECT 25 UNITS [place] WITH EVENING MEAL. Patient taking differently: INJECT 30 UNITS IN THE AM [place] WITH FOOD, AND INJECT 20 UNITS [place] WITH EVENING MEAL. 05/28/17  Yes MUnk Pinto MD  Omega-3 Fatty Acids (FISH OIL) 1200 MG CAPS Take 1 capsule by mouth daily.   Yes [provider]  omeprazole (PRILOSEC) 40 MG capsule TAKE 1 CAPSULE (40 MG TOTAL) BY MOUTH DAILY. 07/08/17  Yes MUnk Pinto MD  simvastatin (ZOCOR) 20 MG tablet Take 1 tablet at Bedtime for Cholesterol 05/28/17 12/26/17 Yes MUnk Pinto MD  vitamin E (VITAMIN E) 400 UNIT capsule Take 400 Units by mouth daily.   Yes [provider]  Blood Glucose Monitoring Suppl (ACCU-CHEK  AVIVA PLUS) W/DEVICE KIT Check blood sugar up to three times daily 03/26/15   CVicie Mutters PA-C  Cholecalciferol (VITAMIN D3) 5000 UNITS TABS Take 1 capsule by mouth daily.    [provider]  glucose blood (ACCU-CHEK AVIVA PLUS) test strip Use daily to check BS TID Dx. E11.22 08/05/15   Forcucci, CLoma Sousa PA-C  Insulin Syringe-Needle U-100 31G X 15/64" 0.5 ML MISC Use two pens daily with insulin 02/23/16   CVicie Mutters PA-C  IRON PO Take by mouth daily.    [provider]  Magnesium 500 MG CAPS Take 1 capsule by mouth daily.    [provider]  meloxicam (MOBIC) 15 MG tablet Take 1 tablet (15 mg total) by mouth daily. Patient not taking: Reported on 07/23/2017 03/22/17   MPrudence Davidson  Belenda Cruise, DPM    Family History Family History  Problem Relation Age of Onset  . CAD Mother 29       Died of MI  . Hypertension Mother   . Heart attack Mother   . Heart disease Mother   . CAD Father 59       Died of MI  . Heart disease Father   . Heart attack Father   . CAD Brother 74       Two brothers died of MI  . Heart attack Brother   . Heart disease Brother        Amputation  . Diabetes Sister   . Hypertension Sister   . Heart attack Brother   . Heart disease Brother   . Colon cancer Neg Hx   . Stomach cancer Neg Hx   . Esophageal cancer Neg Hx     Social History Social History  Substance Use Topics  . Smoking status: Current Every Day Smoker    Packs/day: 0.25    Years: 40.00    Types: Cigarettes  . Smokeless tobacco: Never Used  . Alcohol use No     Allergies   Plavix [clopidogrel bisulfate]; Amoxicillin; Ace inhibitors; Lyrica [pregabalin]; and Penicillins   Review of Systems Review of Systems  Constitutional: Negative for appetite change and fever.  HENT: Negative for dental problem.   Respiratory: Negative for stridor.   Cardiovascular: Negative for chest pain.  Gastrointestinal: Positive for abdominal distention and abdominal pain.   Genitourinary: Positive for frequency and hematuria.  Musculoskeletal: Negative for back pain.  Skin: Negative for rash and wound.  Neurological: Negative for numbness.  Hematological: Negative for adenopathy.  Psychiatric/Behavioral: Negative for confusion.     Physical Exam Updated Vital Signs BP (!) 150/82   Pulse 71   Temp 98.9 F (37.2 C) (Oral)   Resp 19   Ht 5' 6"  (1.676 m)   Wt 82.6 kg (182 lb)   SpO2 96%   BMI 29.38 kg/m   Physical Exam  Constitutional: She appears well-developed.  HENT:  Head: Atraumatic.  Eyes: Pupils are equal, round, and reactive to light.  Neck: Neck supple.  Cardiovascular: Normal rate.   Pulmonary/Chest: Effort normal.  Abdominal: There is tenderness.  Lower abdominal tenderness without rebound or guarding.  Musculoskeletal: She exhibits no edema.  Neurological: She is alert.  Skin: Skin is warm. Capillary refill takes less than 2 seconds.     ED Treatments / Results  Labs (all labs ordered are listed, but only abnormal results are displayed) Labs Reviewed  URINALYSIS, ROUTINE W REFLEX MICROSCOPIC - Abnormal; Notable for the following:       Result Value   Glucose, UA 150 (*)    Hgb urine dipstick LARGE (*)    Protein, ur 100 (*)    Leukocytes, UA TRACE (*)    Bacteria, UA RARE (*)    Squamous Epithelial / LPF 0-5 (*)    All other components within normal limits  I-STAT CHEM 8, ED - Abnormal; Notable for the following:    Chloride 98 (*)    Creatinine, Ser 1.30 (*)    Glucose, Bld 291 (*)    Calcium, Ion 1.14 (*)    All other components within normal limits  URINE CULTURE  CBC WITH DIFFERENTIAL/PLATELET    EKG  EKG Interpretation None       Radiology No results found.  Procedures Procedures (including critical care time)  Medications Ordered in ED Medications - No data  to display   Initial Impression / Assessment and Plan / ED Course  I have reviewed the triage vital signs and the nursing  notes.  Pertinent labs & imaging results that were available during my care of the patient were reviewed by me and considered in my medical decision making (see chart for details).     Patient with chronic abdominal pain the last few months.  Has seen GI for it without clear cause.  Had CT scan done 4 months ago that was reassuring.  No stones at that time.  Now has some hematuria.  Urine reassuring otherwise.  Cultures been sent.  Good renal function.  CBC pending at this time.  Care will be turned over to Dr. Ellender Hose but likely discharge home.  Final Clinical Impressions(s) / ED Diagnoses   Final diagnoses:  Abdominal pain, unspecified abdominal location  Hematuria, unspecified type    New Prescriptions New Prescriptions   No medications on file     Davonna Belling, MD 07/23/17 1656

## 2017-07-23 NOTE — ED Provider Notes (Signed)
71 yo F here with lower abdominal pain and frequency, with hematuria. Lab work reassuring. She has undergone extensive w/u for this. UA with pyuria, bacteria but plan to f/u Cx. CBC is pending. Plan to d/c pending normal platelet count.  WBC 12.6. Plt normal. Pt remains well appearing and in NAD on my exam. She does endorse urinary urgency, and in setting of hematuria, frequency, leukocytosis and pyuria, will tx empirically. Allergy to keflex/penicillin so will trial cipro. UCx sent.   Duffy Bruce, MD 07/23/17 [phone]

## 2017-07-25 LAB — URINE CULTURE: Culture: >=100000 — AB

## 2017-07-26 ENCOUNTER — Telehealth: Payer: Self-pay | Admitting: *Deleted

## 2017-07-26 NOTE — Telephone Encounter (Signed)
Post ED Visit - Positive Culture Follow-up  Culture report reviewed by antimicrobial stewardship pharmacist:  []  Elenor Quinones, Pharm.D. []  Heide Guile, Pharm.D., BCPS AQ-ID []  Parks Neptune, Pharm.D., BCPS []  Alycia Rossetti, Pharm.D., BCPS []  [place], Pharm.D., BCPS, AAHIVP []  Legrand Como, Pharm.D., BCPS, AAHIVP []  Salome Arnt, PharmD, BCPS []  Dimitri Ped, PharmD, BCPS []  Vincenza Hews, PharmD, BCPS Charlene Brooke, PharmD  Positive urine culture Treated with Ciprofloxacin HCL, organism sensitive to the same and no further patient follow-up is required at this time.  Harlon Flor [facility] 07/26/2017, 9:51 AM

## 2017-07-30 ENCOUNTER — Other Ambulatory Visit: Payer: Self-pay | Admitting: Internal Medicine

## 2017-07-30 DIAGNOSIS — F419 Anxiety disorder, unspecified: Secondary | ICD-10-CM

## 2017-08-10 ENCOUNTER — Ambulatory Visit: Payer: Medicare Other | Admitting: Nurse Practitioner

## 2017-08-22 DIAGNOSIS — R350 Frequency of micturition: Secondary | ICD-10-CM | POA: Diagnosis not present

## 2017-08-22 DIAGNOSIS — M1990 Unspecified osteoarthritis, unspecified site: Secondary | ICD-10-CM | POA: Insufficient documentation

## 2017-08-22 DIAGNOSIS — G629 Polyneuropathy, unspecified: Secondary | ICD-10-CM | POA: Insufficient documentation

## 2017-08-22 DIAGNOSIS — F419 Anxiety disorder, unspecified: Secondary | ICD-10-CM | POA: Insufficient documentation

## 2017-08-22 DIAGNOSIS — N952 Postmenopausal atrophic vaginitis: Secondary | ICD-10-CM | POA: Diagnosis not present

## 2017-08-22 DIAGNOSIS — Z1231 Encounter for screening mammogram for malignant neoplasm of breast: Secondary | ICD-10-CM | POA: Diagnosis not present

## 2017-08-22 DIAGNOSIS — R1030 Lower abdominal pain, unspecified: Secondary | ICD-10-CM | POA: Diagnosis not present

## 2017-08-22 DIAGNOSIS — R3915 Urgency of urination: Secondary | ICD-10-CM | POA: Diagnosis not present

## 2017-08-22 DIAGNOSIS — E1340 Other specified diabetes mellitus with diabetic neuropathy, unspecified: Secondary | ICD-10-CM | POA: Insufficient documentation

## 2017-08-25 DIAGNOSIS — R310 Gross hematuria: Secondary | ICD-10-CM | POA: Diagnosis not present

## 2017-08-25 DIAGNOSIS — R102 Pelvic and perineal pain: Secondary | ICD-10-CM | POA: Diagnosis not present

## 2017-09-05 ENCOUNTER — Encounter: Payer: Self-pay | Admitting: Internal Medicine

## 2017-09-09 ENCOUNTER — Ambulatory Visit: Payer: Medicare Other | Admitting: Family

## 2017-09-09 ENCOUNTER — Encounter (HOSPITAL_COMMUNITY): Payer: Medicare Other

## 2017-09-24 NOTE — Progress Notes (Signed)
Medicare Wellness and 3 month   Assessment:   Essential hypertension - start NEW medication and monitor at home, DASH diet, exercise. Call if greater than 130/80.  -     CBC with Differential/Platelet -     BASIC METABOLIC PANEL WITH GFR -     Hepatic function panel -     Cancel: TSH -     telmisartan (MICARDIS) 80 MG tablet; Take 1 tablet (80 mg total) by mouth daily.  Peripheral vascular disease due to secondary diabetes mellitus (HCC) Monitor feet Increase walking  Cerebrovascular accident (CVA) due to thrombosis of left posterior cerebral artery (HCC) Control blood pressure, cholesterol, glucose, increase exercise.   PVD (peripheral vascular disease) (HCC) Control blood pressure, cholesterol, glucose, increase exercise.  Continue bASA STOP smoking  CKD stage 3 due to type 2 diabetes mellitus (HCC) Increase fluids, avoid NSAIDS, monitor sugars, will monitor -     BASIC METABOLIC PANEL WITH GFR  Insulin-requiring or dependent type II diabetes mellitus ([place]) Bring in 1 month sugars and diet check, may adjust sugars - insurance would cover Bcise if patient wants to cut back on insulin but will not be able to stop it -     Hemoglobin A1c  Tobacco abuse Has nodule, need repeat scan Smoking cessation-  instruction/counseling given, counseled patient on the dangers of tobacco use, advised patient to stop smoking, and reviewed strategies to maximize success, patient not ready to quit at this time.  -     CT Chest Wo Contrast; Future  Depression, major, recurrent, in partial remission ([place]) - continue medications, stress management techniques discussed, increase water, good sleep hygiene discussed, increase exercise, and increase veggies.   Medication management -     Cancel: Magnesium  Atherosclerosis of native artery of extremity with intermittent claudication, unspecified extremity ([place]) Control blood pressure, cholesterol, glucose, increase exercise.   Vitamin D  deficiency Continue supplement  Hyperlipidemia, mixed -continue medications, check lipids, decrease fatty foods, increase activity.  -     Lipid panel  Brain aneurysm Stop smoking, get on BP med  ESOPHAGEAL STRICTURE Continue PPI  Gastroesophageal reflux disease, esophagitis presence not specified Continue PPI/H2 blocker, diet discussed  Nontoxic multinodular goiter Monitor  History of CVA (cerebrovascular accident) Control blood pressure, cholesterol, glucose, increase exercise.   Glaucoma, unspecified glaucoma type, unspecified laterality Will get Dr. Herbert Deaner notes  Diverticulosis of large intestine without hemorrhage Increase fiber  History of colonic polyps UTD   Encounter for Medicare annual wellness exam 1 year Get MGM report from GYN  BMI 29.0-29.9,adult  Overweight  - long discussion about weight loss, diet, and exercise -recommended diet heavy in fruits and veggies and low in animal meats, cheeses, and dairy products  Dysphagia, unspecified type -     Ambulatory referral to ENT - switch losartan to ARB, with smoking history will refer to ENT for evaluation - may need EGD if negative with eNT  Anxiety state -     clonazePAM (KLONOPIN) 0.5 MG tablet; Take 1/2 to 1 tablet 1 or 2 x/dily only if needed for severe anxiety   Future Appointments  Date Time Provider [place]  10/05/2017 10:00 AM [name], MD [facility] None  10/19/2017  9:45 AM Gardiner Barefoot, DPM [facility]  10/31/2017 11:00 AM [name], PA-C [facility] None  03/27/2018  8:00 AM [facility] 4 [facility]  03/27/2018  8:45 AM Nickel, Sharmon Leyden, NP [facility]  ' Plan:   During the course of the visit  the patient was educated and counseled about appropriate screening and preventive services including:    Pneumococcal vaccine   Influenza vaccine  Td vaccine  Screening electrocardiogram  Screening mammography  Screening Pap smear and pelvic exam   Bone  densitometry screening  Colorectal cancer screening  Diabetes screening  Glaucoma screening  Nutrition counseling   Smoking cessation counseling   Subjective:   [name] is a 72 y.o. female who presents for Medicare Annual Wellness Visit and 3 month follow up on hypertension, diabetes, hyperlipidemia, vitamin D def.   Her blood pressure has been controlled at home, she is on norvasc '10mg'$  and losartan '100mg'$ , she has been out of the losartan, today their BP is BP: (!) 160/80 She does workout rides her bike at home. She denies chest pain, shortness of breath, dizziness.  Patient went to ER for AB pain and hematuria, she was given cipro and had swelling due to this and stopped it. She feels a knot on her neck and has hoarseness, feels swollen or something on right side of her neck.   She is on cholesterol medication, simvastatin '20mg'$  and denies myalgias. Her cholesterol is not at goal. The cholesterol last visit was:   Lab Results  Component Value Date   CHOL 213 (H) 05/04/2017   HDL 59 05/04/2017   LDLCALC 92 01/26/2017   LDLDIRECT 146.4 01/30/2010   TRIG 177 (H) 05/04/2017   CHOLHDL 3.6 05/04/2017   She has several comorbidities from her DM including neuropathy, nephropathy, history of TIA and PAD, she been working on diet and exercise for diabetes, and denies polydipsia, polyuria and visual disturbances. She is on ASA 325 and ARB. She is on MF, Januvia, Novolog mix 70/30 she is on 25 units depending on her number and 25 units at night with food. She has been out x 1 week of insulin but got last week.  She could not tolerate invokana.  and Last A1C in the office was:  Lab Results  Component Value Date   HGBA1C 10.5 (H) 05/04/2017    Patient is on Vitamin D. BMI is Body mass index is 29.76 kg/m., she is working on diet and exercise. Wt Readings from Last 3 Encounters:  09/27/17 184 lb 6.4 oz (83.6 kg)  09/26/17 185 lb 11.2 oz (84.2 kg)  07/23/17 182 lb (82.6 kg)     Names of Other Physician/Practitioners you currently use: 1. [facility]- here for primary care 2. Dr. Herbert Deaner, DIABETIC eye doctor, last visit 2018 Patient Care Team: [name], MD as PCP - General (Internal Medicine) Michel Santee, MD as Consulting Physician (Neurology) Serafina Mitchell, MD as Consulting Physician (Vascular Surgery) Irene Shipper, MD as Consulting Physician (Gastroenterology) Gardiner Barefoot, DPM as Consulting Physician (Podiatry) Hortencia Pilar, MD as Consulting Physician (Surgery)  Medication Review Current Outpatient Medications on File Prior to Visit  Medication Sig  . amLODipine (NORVASC) 10 MG tablet TAKE 1 TABLET (10 MG TOTAL) BY MOUTH DAILY.  Marland Kitchen aspirin EC 81 MG tablet Take 81 mg by mouth daily.  Marland Kitchen b complex vitamins capsule Take 1 capsule by mouth daily.  . Blood Glucose Monitoring Suppl (ACCU-CHEK AVIVA PLUS) W/DEVICE KIT Check blood sugar up to three times daily  . Cholecalciferol (VITAMIN D3) 5000 UNITS TABS Take 1 capsule by mouth daily.  . fluticasone (FLONASE) 50 MCG/ACT nasal spray Place 2 sprays into both nostrils daily. (Patient taking differently: Place 2 sprays into both nostrils as needed for  allergies. )  . glucose blood (ACCU-CHEK AVIVA PLUS) test strip Use daily to check BS TID Dx. E11.22  . Insulin Syringe-Needle U-100 31G X 15/64" 0.5 ML MISC Use two pens daily with insulin  . losartan (COZAAR) 100 MG tablet Take 1 tablet (100 mg total) by mouth daily.  . meloxicam (MOBIC) 15 MG tablet Take 1 tablet (15 mg total) by mouth daily.  . Omega-3 Fatty Acids (FISH OIL) 1200 MG CAPS Take 1 capsule by mouth daily.   No current facility-administered medications on file prior to visit.     Current Problems (verified) Patient Active Problem List   Diagnosis Date Noted  . PVD (peripheral vascular disease) ([place]) 04/13/2017  . Cerebrovascular accident (CVA) due to thrombosis of left posterior cerebral  artery ([place]) 01/25/2017  . Hyperlipidemia, mixed 11/07/2015  . Insulin-requiring or dependent type II diabetes mellitus ([place]) 11/07/2015  . Encounter for Medicare annual wellness exam 06/24/2015  . Generalized anxiety disorder 03/26/2015  . Depression, major, recurrent, in partial remission ([place]) 03/26/2015  . History of CVA (cerebrovascular accident) 10/04/2014  . Tobacco abuse 10/04/2014  . Vitamin D deficiency 11/02/2013  . Medication management 11/02/2013  . Peripheral vascular disease due to secondary diabetes mellitus ([place]) 05/08/2012  . Atherosclerosis of native arteries of extremity with intermittent claudication 12/24/2011  . Diverticulosis of large intestine 06/09/2010  . History of colonic polyps 06/09/2010  . ESOPHAGEAL STRICTURE 08/27/2008  . Esophageal reflux 08/27/2008  . Nontoxic multinodular goiter 03/11/2008  . Glaucoma 03/11/2008  . Brain aneurysm 03/11/2008  . CKD stage 3 due to type 2 diabetes mellitus ([place]) 05/01/2007  . Essential hypertension 05/01/2007    Screening Tests Immunization History  Administered Date(s) Administered  . DT 03/26/2015  . Influenza Whole 08/02/2007, 06/18/2009, 07/09/2013  . Influenza, High Dose Seasonal PF 08/05/2015, 07/22/2016, 07/04/2017  . Pneumococcal Conjugate-13 07/22/2016  . Pneumococcal Polysaccharide-23 02/08/2014  . Td 09/21/2004    Preventative care: Last colonoscopy: 2011 Dr. Henrene Pastor due 10 years Last mammogram: 04/2014 had done at GYN Last pap smear/pelvic exam: 2007  DEXA: 2016 Echo 2016 ABI 2014 MRI brain 09/2014 Ct chest WO contrast 04/2015- 5 mm LLL nodule- needs repeat CT AB 03/2017  Prior vaccinations: TD or Tdap: 2016  Influenza: 2018  Pneumococcal: 2015 Prevnar 13:2017 Shingles/Zostavax: Check price  Allergies Allergies  Allergen Reactions  . Plavix [Clopidogrel Bisulfate] Palpitations  . Amoxicillin Itching and Swelling    FACE & EYES SWELL  . Ace Inhibitors   . Ciprofloxacin Hcl Swelling   . Lyrica [Pregabalin]     Itch, gain weight  . Penicillins Other (See Comments)    REACTION: unspecified    SURGICAL HISTORY She  has a past surgical history that includes Cesarean section; Cholecystectomy; Knee surgery; rotator cuff surgery; Thyroid surgery; Hernia repair; Abdominal hysterectomy (1984); Femoral artery stent (05/11/11); Dental surgery (Aug. 16, 2013); Femoral-popliteal Bypass Graft (Nov. 26, 2013); Eye surgery (Nov. 2014); Esophagogastroduodenoscopy (07/2010); Colonoscopy (07/2010); abdominal aortagram (N/A, 01/04/2012); abdominal aortagram (N/A, 08/15/2012); lower extremity angiogram (Left, 08/15/2012); cataract surgery (Right, 10-22-2015); and cataract surgery (Left, 11-05-2015). FAMILY HISTORY Her family history includes CAD (age of onset: 72) in her brother; CAD (age of onset: 62) in her father and mother; Diabetes in her sister; Heart attack in her brother, brother, father, and mother; Heart disease in her brother, brother, father, and mother; Hypertension in her mother and sister. SOCIAL HISTORY She  reports that she has been smoking cigarettes.  She has a 10.00 pack-year smoking history. she has never  used smokeless tobacco. She reports that she does not drink alcohol or use drugs.  MEDICARE WELLNESS OBJECTIVES: Physical activity: Current Exercise Habits: Home exercise routine, Intensity: Mild Cardiac risk factors: Cardiac Risk Factors include: advanced age (>32mn, >>56women);diabetes mellitus;dyslipidemia;hypertension;smoking/ tobacco exposure;sedentary lifestyle Depression/mood screen:   [facility] 09/27/2017  Decreased Interest 0  Down, Depressed, Hopeless 0  PHQ - 2 Score 0  Some recent data might be hidden    ADLs:  In your present state of health, do you have any difficulty performing the following activities: 09/27/2017 01/31/2017  Hearing? N N  Vision? N N  Difficulty concentrating or making decisions? Y N  Walking or climbing stairs? Y N   Dressing or bathing? N N  Doing errands, shopping? N N  Some recent data might be hidden     Cognitive Testing  Alert? Yes  Normal Appearance?Yes  Oriented to person? Yes  Place? Yes   Time? Yes  Recall of three objects?  Yes  Can perform simple calculations? Yes  Displays appropriate judgment?Yes  Can read the correct time from a watch face?Yes  EOL planning: Does Patient Have a Medical Advance Directive?: Yes Type of Advance Directive: Living will   Objective:   Blood pressure (!) 160/80, pulse 76, temperature 97.7 F (36.5 C), resp. rate 16, height 5' 6"  (1.676 m), weight 184 lb 6.4 oz (83.6 kg), SpO2 96 %. Body mass index is 29.76 kg/m.  General appearance: alert, no distress, WD/WN,  female HEENT: normocephalic, sclerae anicteric, TMs pearly, nares patent, no discharge or erythema, pharynx normal. Oral cavity: MMM, no lesions Neck: supple, no lymphadenopathy, no thyromegaly, no masses Heart: RRR, normal S1, S2, no murmurs Lungs: CTA bilaterally, no wheezes, rhonchi, or rales Abdomen: +bs, soft, non tender, non distended, no masses, no hepatomegaly, no splenomegaly Musculoskeletal: nontender, no swelling, no obvious deformity Extremities: no edema, no cyanosis, no clubbing Pulses: 1+ symmetric, upper and lower extremities, normal cap refill Neurological: alert, oriented x 3, CN2-12 intact, strength normal upper extremities and lower extremities, sensation slightly decreased throughout bottom of bilateral feet, DTRs 2+ throughout, no cerebellar signs, gait normal Psychiatric: normal affect, behavior normal, pleasant  Breast: defer Gyn: defer Rectal: defer  Medicare Attestation I have personally reviewed: The patient's medical and social history Their use of alcohol, tobacco or illicit drugs Their current medications and supplements The patient's functional ability including ADLs,fall risks, home safety risks, cognitive, and hearing and visual impairment Diet and  physical activities Evidence for depression or mood disorders  The patient's weight, height, BMI, and visual acuity have been recorded in the chart.  I have made referrals, counseling, and provided education to the patient based on review of the above and I have provided the patient with a written personalized care plan for preventive services.     AVicie Mutters PA-C   09/27/2017

## 2017-09-26 ENCOUNTER — Ambulatory Visit (HOSPITAL_COMMUNITY)
Admission: RE | Admit: 2017-09-26 | Discharge: 2017-09-26 | Disposition: A | Discharge status: Home / Self Care | Payer: Medicare Other | Source: Ambulatory Visit | Attending: Family | Admitting: Family

## 2017-09-26 ENCOUNTER — Encounter: Payer: Self-pay | Admitting: Family

## 2017-09-26 ENCOUNTER — Ambulatory Visit (INDEPENDENT_AMBULATORY_CARE_PROVIDER_SITE_OTHER): Payer: Medicare Other | Admitting: Family

## 2017-09-26 VITALS — BP 155/63 | HR 66 | Temp 97.4°F | Resp 18 | Ht 66.0 in | Wt 185.7 lb

## 2017-09-26 DIAGNOSIS — I739 Peripheral vascular disease, unspecified: Secondary | ICD-10-CM | POA: Diagnosis not present

## 2017-09-26 DIAGNOSIS — E1151 Type 2 diabetes mellitus with diabetic peripheral angiopathy without gangrene: Secondary | ICD-10-CM

## 2017-09-26 DIAGNOSIS — F17200 Nicotine dependence, unspecified, uncomplicated: Secondary | ICD-10-CM | POA: Diagnosis not present

## 2017-09-26 DIAGNOSIS — I779 Disorder of arteries and arterioles, unspecified: Secondary | ICD-10-CM

## 2017-09-26 DIAGNOSIS — Z959 Presence of cardiac and vascular implant and graft, unspecified: Secondary | ICD-10-CM

## 2017-09-26 NOTE — Progress Notes (Signed)
[facility] [place]   CC: Follow up peripheral artery occlusive disease  History of Present Illness [name] is a 72 y.o. female who is s/p stenting of her left superficial femoral artery in September of 2012 by Dr. Trula Slade. This alleviated her symptoms initially. 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   She has a known cerebral aneurysm followed by her neurologist, Dr. Michel Santee, who is also following her carotid arteries, has had several TIA's.   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 MRA showed mod stenosis of the Lt posterior cerebral Aa. 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  Carotid dopplers & 2D echocardiogram had no significant abnormalities. During hospitalization, BP's were mildly elevated and permissively monitored. Patient remained stable, improved and was d/c'd on 01/27/2017 for outpatient f/u. Patient was advised to continue ASA 325 mg daily & saw Dr Catalina Gravel on 01/18/2017 and he advised her to switch to Aggrenox and further advised that she f/u with a traditional Neurologist as advised at hospital discharge (Dr Lavera Guise). She has residual right arm numbness. She had some physical therapy.   She was hospitalized January 2016 at [facility] then [facility] with loss of balance, states she was told that she did not have a stroke, was told that she was dehydrated.  She was evaluated for abdominal pain in [facility] ED in November 2018, treated for lower UTI. She states the Cipro prescribed for this caused right facial swelling.   Her feet ache and feel cold all the time, she sees a podiatrist.   She has a tired feeling in her  legs if she walks a long distance, relieved by rest; she does report pain in left hip and thigh with walking at times , denies non-healing wounds.   Dr. Trula Slade stopped the Pletal since it did not help her claudication symptoms.  She reports arthritis pain in both knees.  She has a swallowing problem with nausea and abdominal pain "for a long time". She was referred to GI.  She states her insurance will not pay for [facility] which was advised by Dr. Dwyane Dee.   She states she has stage 3 CKD.  DM: A1C was 8.26 September 2014 (review of records) 9.3 in February 2017, 10.9 on 11-10-16, 10.5 on 05-04-17; states she has been tried on multiple DM medications. I referred her to Dr. Dwyane Dee at her 01-13-17 visit.  Tobacco use: smoker (1/2 ppd x 40 yrs, is trying to quit)  Pt meds include:  Statin :Yes ASA: Yes Other anticoagulants/antiplatelets: allergic to Plavix     Past Medical History:  Diagnosis Date  . Abnormal findings on esophagogastroduodenoscopy (EGD) 07/2010  . Aneurysm [facility]) 2013   Right Brain   . Aneurysm ([place])    in brain x 2  . Anxiety   . Colon polyps   . Diabetes mellitus 1998  . Diverticulosis   . GERD (gastroesophageal reflux disease)   . Hemorrhoids   . Hiatal hernia   . Hyperlipidemia   . Hypertension 2000  . Migraines   . Peripheral vascular disease ([place])   . Phlebitis    30 years ago  left leg  . Status post colonoscopy 07/2010  . Stroke [facility])    multiple mini strokes ( brain aneurysm )    Social History Social History   Tobacco Use  . Smoking status: Current Every Day Smoker    Packs/day: 0.25    Years: 40.00    Pack years: 10.00    Types: Cigarettes  . Smokeless tobacco: Never Used  Substance Use Topics  . Alcohol use: No    Alcohol/week: 0.0 oz  . Drug use: No    Family History Family History  Problem Relation Age of Onset  . CAD Mother 43       Died of MI  . Hypertension Mother   . Heart attack Mother   . Heart disease Mother   .  CAD Father 69       Died of MI  . Heart disease Father   . Heart attack Father   . CAD Brother 14       Two brothers died of MI  . Heart attack Brother   . Heart disease Brother        Amputation  . Diabetes Sister   . Hypertension Sister   . Heart attack Brother   . Heart disease Brother   . Colon cancer Neg Hx   . Stomach cancer Neg Hx   . Esophageal cancer Neg Hx     Past Surgical History:  Procedure Laterality Date  . ABDOMINAL AORTAGRAM N/A 01/04/2012   Procedure: ABDOMINAL Maxcine Ham;  Surgeon: Serafina Mitchell, MD;  Location: [facility] CATH LAB;  Service: Cardiovascular;  Laterality: N/A;  . ABDOMINAL AORTAGRAM N/A 08/15/2012   Procedure: ABDOMINAL Maxcine Ham;  Surgeon: Serafina Mitchell, MD;  Location: [facility] CATH LAB;  Service: Cardiovascular;  Laterality: N/A;  . ABDOMINAL HYSTERECTOMY  1984  . cataract surgery Right 10-22-2015  . cataract surgery Left 11-05-2015  . CESAREAN SECTION    . CHOLECYSTECTOMY    . COLONOSCOPY  07/2010  . DENTAL SURGERY  Aug. 16, 2013   left lower   . ESOPHAGOGASTRODUODENOSCOPY  07/2010  . EYE SURGERY  Nov. 2014   Laser-Glaucoma  . FEMORAL ARTERY STENT  05/11/11   Left superficial femoral and popliteal artery  . FEMORAL-POPLITEAL BYPASS GRAFT  Nov. 26, 2013   Left Angiogram-   . HERNIA REPAIR     times two  . KNEE SURGERY    . LOWER EXTREMITY ANGIOGRAM Left 08/15/2012   Procedure: LOWER EXTREMITY ANGIOGRAM;  Surgeon: Serafina Mitchell, MD;  Location[facility] CATH LAB;  Service: Cardiovascular;  Laterality: Left;  lt leg angio  . rotator cuff surgery    . THYROID SURGERY      Allergies  Allergen Reactions  . Plavix [Clopidogrel Bisulfate] Palpitations  . Amoxicillin Itching and Swelling    FACE & EYES SWELL  . Ace Inhibitors   . Ciprofloxacin Hcl Swelling  . Lyrica [Pregabalin]     Itch, gain weight  . Penicillins Other (See Comments)    REACTION: unspecified    Current Outpatient Medications  Medication Sig Dispense Refill  . amLODipine  (NORVASC) 10 MG tablet TAKE 1 TABLET (10 MG TOTAL) BY MOUTH DAILY. 90 tablet 1  . aspirin EC 81 MG tablet Take 81 mg by mouth daily.    . Blood Glucose Monitoring Suppl (ACCU-CHEK AVIVA PLUS) W/DEVICE KIT Check blood sugar up to three times daily 1 kit 0  . Cholecalciferol (VITAMIN D3) 5000 UNITS TABS Take 1 capsule by mouth daily.    Marland Kitchen  CINNAMON PO Take 1,000 mg by mouth 2 (two) times daily.     . citalopram (CELEXA) 40 MG tablet TAKE 1 TABLET DAILY FOR MOOD & ANXIETY 90 tablet 1  . fluticasone (FLONASE) 50 MCG/ACT nasal spray Place 2 sprays into both nostrils daily. (Patient taking differently: Place 2 sprays into both nostrils as needed for allergies. ) 16 g 0  . Garlic 9371 MG CAPS Take by mouth.    Marland Kitchen glucose blood (ACCU-CHEK AVIVA PLUS) test strip Use daily to check BS TID Dx. E11.22 300 each 1  . Insulin Syringe-Needle U-100 31G X 15/64" 0.5 ML MISC Use two pens daily with insulin 100 each 3  . losartan (COZAAR) 100 MG tablet Take 1 tablet (100 mg total) by mouth daily. 90 tablet 2  . meloxicam (MOBIC) 15 MG tablet Take 1 tablet (15 mg total) by mouth daily. 30 tablet 1  . NOVOLIN 70/30 (70-30) 100 UNIT/ML injection INJECT 25 UNITS IN THE AM [place] WITH FOOD, AND INJECT 25 UNITS [place] WITH EVENING MEAL. (Patient taking differently: INJECT 30 UNITS IN THE AM [place] WITH FOOD, AND INJECT 20 UNITS [place] WITH EVENING MEAL.) 60 mL 1  . Omega-3 Fatty Acids (FISH OIL) 1200 MG CAPS Take 1 capsule by mouth daily.    Marland Kitchen omeprazole (PRILOSEC) 40 MG capsule TAKE 1 CAPSULE (40 MG TOTAL) BY MOUTH DAILY. 90 capsule 2  . simvastatin (ZOCOR) 20 MG tablet Take 1 tablet at Bedtime for Cholesterol 90 tablet 2  . vitamin E (VITAMIN E) 400 UNIT capsule Take 400 Units by mouth daily.    Marland Kitchen b complex vitamins capsule Take 1 capsule by mouth daily.    . clonazePAM (KLONOPIN) 0.5 MG tablet Take 1/2 to 1 tablet 1 or 2 x/dily only if needed for severe anxiety (Patient taking differently: Take 0.25 mg by mouth at bedtime. Take 1/2 to 1  tablet 1 or 2 x/dily only if needed for severe anxiety) 180 tablet 0  . IRON PO Take by mouth daily.    . Magnesium 500 MG CAPS Take 1 capsule by mouth daily.     No current facility-administered medications for this visit.     ROS: See HPI for pertinent positives and negatives.   Physical Examination  Vitals:   09/26/17 1015 09/26/17 1018  BP: (!) 173/70 (!) 155/63  Pulse: 66   Resp: 18   Temp: (!) 97.4 F (36.3 C)   TempSrc: Oral   SpO2: 96%   Weight: 185 lb 11.2 oz (84.2 kg)   Height: 5' 6"  (1.676 m)    Body mass index is 29.97 kg/m.  General: A&O x 3, WDWN female Gait: normal  Eyes: PERRLA  Pulmonary: Respirations are non labored, CTAB, without wheezes, rales, or rhonchi  Cardiac: regular rythm, no detected murmur   Carotid Bruits Right Left   negative negative   Abdominal aortic pulse is not palpable. Radial pulses: 1+ palpable bilaterally                            VASCULAR EXAM: Extremities without ischemic changes, without Gangrene; without open wounds.  LE Pulses Right Left       FEMORAL  2+ palpable  2+palpable        POPLITEAL  not palpable   not palpable       POSTERIOR TIBIAL  not palpable   not palpable        DORSALIS PEDIS      ANTERIOR TIBIAL not palpable  not palpable     Abdomen: soft, non tender, no palpable masses.  Skin: no rashes, no ulcers.  Musculoskeletal: no muscle wasting or atrophy.  Neurologic: A&O X 3; Appropriate Affect ; SENSATION: normal; MOTOR FUNCTION: moving all extremities equally, motor strength 4/5 in UE's, 4/5 in LE's. Speech is slightly expressively aphasic and rambling. CN 2-12 intact.       ASSESSMENT: MELINE RUSSAW is a 72 y.o. female who is s/p stenting of her left superficial femoral artery in September of 2012. This alleviated her symptoms  initially. 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  She has a known cerebral aneurysm followed by her neurologist who is also following her carotid arteries, has had several TIA's. 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 She has residual numbness in her right arm.   She has a tired feeling in her anterior thighs after walking about 30 feet, relieved by rest, no signs of ischemia in her feet or legs. Femoral pulses are 2+ palpable bilaterally.  Her atherosclerotic risk factors include worsening uncontrolled DM and active smoking. She is seeing an endocrinologist since mid 2018.   She has an appointment to see her PCP tomorrow.    DATA  ABI (Date: 09/26/2017):  R:   ABI: 0.61 (was 0.77 on 01-13-17),   PT: mono  DP: mono  TBI:  0.34 (was 0.45)  L:   ABI: 0.60 (was 0.61),   PT: mono  DP: mono  TBI: 0.38 (was 0.38)  Decline in the right ABI to moderate disease, stable in the left with moderate disease. All monophasic waveforms.    PLAN:  The patient was counseled re smoking cessation and given several free resources re smoking cessation. Graduated walking program discussed and how to achieve.    Based on the patient's vascular studies and examination, pt will return to clinic in 6 months with ABI's, she knows to return sooner if needed.    I discussed in depth with the patient the nature of atherosclerosis, and emphasized the importance of maximal medical management including strict control of blood pressure, blood glucose, and lipid levels, obtaining regular exercise, and cessation of smoking.  The patient is aware that without maximal medical management the underlying atherosclerotic disease process will progress, limiting the benefit of any interventions.  The  patient was given information about PAD including signs, symptoms, treatment, what symptoms should prompt the patient to seek immediate medical care, and risk reduction measures to take.  Clemon Chambers, RN, MSN, FNP-C [facility] Phone: [phone]  Clinic MD: Trula Slade  09/26/17 10:22 AM

## 2017-09-26 NOTE — Patient Instructions (Signed)
Steps to Quit Smoking Smoking tobacco can be bad for your health. It can also affect almost every organ in your body. Smoking puts you and people around you at risk for many serious long-lasting (chronic) diseases. Quitting smoking is hard, but it is one of the best things that you can do for your health. It is never too late to quit. What are the benefits of quitting smoking? When you quit smoking, you lower your risk for getting serious diseases and conditions. They can include:  Lung cancer or lung disease.  Heart disease.  Stroke.  Heart attack.  Not being able to have children (infertility).  Weak bones (osteoporosis) and broken bones (fractures).  If you have coughing, wheezing, and shortness of breath, those symptoms may get better when you quit. You may also get sick less often. If you are pregnant, quitting smoking can help to lower your chances of having a baby of low birth weight. What can I do to help me quit smoking? Talk with your doctor about what can help you quit smoking. Some things you can do (strategies) include:  Quitting smoking totally, instead of slowly cutting back how much you smoke over a period of time.  Going to in-person counseling. You are more likely to quit if you go to many counseling sessions.  Using resources and support systems, such as: ? Online chats with a counselor. ? Phone quitlines. ? Printed self-help materials. ? Support groups or group counseling. ? Text messaging programs. ? Mobile phone apps or applications.  Taking medicines. Some of these medicines may have nicotine in them. If you are pregnant or breastfeeding, do not take any medicines to quit smoking unless your doctor says it is okay. Talk with your doctor about counseling or other things that can help you.  Talk with your doctor about using more than one strategy at the same time, such as taking medicines while you are also going to in-person counseling. This can help make  quitting easier. What things can I do to make it easier to quit? Quitting smoking might feel very hard at first, but there is a lot that you can do to make it easier. Take these steps:  Talk to your family and friends. Ask them to support and encourage you.  Call phone quitlines, reach out to support groups, or work with a counselor.  Ask people who smoke to not smoke around you.  Avoid places that make you want (trigger) to smoke, such as: ? Bars. ? Parties. ? Smoke-break areas at work.  Spend time with people who do not smoke.  Lower the stress in your life. Stress can make you want to smoke. Try these things to help your stress: ? Getting regular exercise. ? Deep-breathing exercises. ? Yoga. ? Meditating. ? Doing a body scan. To do this, close your eyes, focus on one area of your body at a time from head to toe, and notice which parts of your body are tense. Try to relax the muscles in those areas.  Download or buy apps on your mobile phone or tablet that can help you stick to your quit plan. There are many free apps, such as QuitGuide from the CDC (Centers for Disease Control and Prevention). You can find more support from smokefree.gov and other websites.  This information is not intended to replace advice given to you by your health care provider. Make sure you discuss any questions you have with your health care provider. Document Released: 07/03/2009 Document   Revised: 05/04/2016 Document Reviewed: 01/21/2015 Elsevier Interactive Patient Education  2018 Elsevier Inc.     Peripheral Vascular Disease Peripheral vascular disease (PVD) is a disease of the blood vessels that are not part of your heart and brain. A simple term for PVD is poor circulation. In most cases, PVD narrows the blood vessels that carry blood from your heart to the rest of your body. This can result in a decreased supply of blood to your arms, legs, and internal organs, like your stomach or kidneys.  However, it most often affects a person's lower legs and feet. There are two types of PVD.  Organic PVD. This is the more common type. It is caused by damage to the structure of blood vessels.  Functional PVD. This is caused by conditions that make blood vessels contract and tighten (spasm).  Without treatment, PVD tends to get worse over time. PVD can also lead to acute ischemic limb. This is when an arm or limb suddenly has trouble getting enough blood. This is a medical emergency. Follow these instructions at home:  Take medicines only as told by your doctor.  Do not use any tobacco products, including cigarettes, chewing tobacco, or electronic cigarettes. If you need help quitting, ask your doctor.  Lose weight if you are overweight, and maintain a healthy weight as told by your doctor.  Eat a diet that is low in fat and cholesterol. If you need help, ask your doctor.  Exercise regularly. Ask your doctor for some good activities for you.  Take good care of your feet. ? Wear comfortable shoes that fit well. ? Check your feet often for any cuts or sores. Contact a doctor if:  You have cramps in your legs while walking.  You have leg pain when you are at rest.  You have coldness in a leg or foot.  Your skin changes.  You are unable to get or have an erection (erectile dysfunction).  You have cuts or sores on your feet that are not healing. Get help right away if:  Your arm or leg turns cold and blue.  Your arms or legs become red, warm, swollen, painful, or numb.  You have chest pain or trouble breathing.  You suddenly have weakness in your face, arm, or leg.  You become very confused or you cannot speak.  You suddenly have a very bad headache.  You suddenly cannot see. This information is not intended to replace advice given to you by your health care provider. Make sure you discuss any questions you have with your health care provider. Document Released:  12/01/2009 Document Revised: 02/12/2016 Document Reviewed: 02/14/2014 Elsevier Interactive Patient Education  2017 Elsevier Inc.  

## 2017-09-27 ENCOUNTER — Encounter: Payer: Self-pay | Admitting: Physician Assistant

## 2017-09-27 ENCOUNTER — Other Ambulatory Visit: Payer: Self-pay

## 2017-09-27 ENCOUNTER — Ambulatory Visit (INDEPENDENT_AMBULATORY_CARE_PROVIDER_SITE_OTHER): Payer: Medicare Other | Admitting: Physician Assistant

## 2017-09-27 VITALS — BP 160/80 | HR 76 | Temp 97.7°F | Resp 16 | Ht 66.0 in | Wt 184.4 lb

## 2017-09-27 DIAGNOSIS — H409 Unspecified glaucoma: Secondary | ICD-10-CM

## 2017-09-27 DIAGNOSIS — I671 Cerebral aneurysm, nonruptured: Secondary | ICD-10-CM

## 2017-09-27 DIAGNOSIS — Z794 Long term (current) use of insulin: Secondary | ICD-10-CM | POA: Diagnosis not present

## 2017-09-27 DIAGNOSIS — Z720 Tobacco use: Secondary | ICD-10-CM

## 2017-09-27 DIAGNOSIS — Z79899 Other long term (current) drug therapy: Secondary | ICD-10-CM | POA: Diagnosis not present

## 2017-09-27 DIAGNOSIS — E119 Type 2 diabetes mellitus without complications: Secondary | ICD-10-CM

## 2017-09-27 DIAGNOSIS — Z86010 Personal history of colonic polyps: Secondary | ICD-10-CM

## 2017-09-27 DIAGNOSIS — E1351 Other specified diabetes mellitus with diabetic peripheral angiopathy without gangrene: Secondary | ICD-10-CM

## 2017-09-27 DIAGNOSIS — N183 Chronic kidney disease, stage 3 (moderate): Secondary | ICD-10-CM | POA: Diagnosis not present

## 2017-09-27 DIAGNOSIS — E042 Nontoxic multinodular goiter: Secondary | ICD-10-CM

## 2017-09-27 DIAGNOSIS — E559 Vitamin D deficiency, unspecified: Secondary | ICD-10-CM | POA: Diagnosis not present

## 2017-09-27 DIAGNOSIS — I70219 Atherosclerosis of native arteries of extremities with intermittent claudication, unspecified extremity: Secondary | ICD-10-CM | POA: Diagnosis not present

## 2017-09-27 DIAGNOSIS — Z0001 Encounter for general adult medical examination with abnormal findings: Secondary | ICD-10-CM | POA: Diagnosis not present

## 2017-09-27 DIAGNOSIS — F3341 Major depressive disorder, recurrent, in partial remission: Secondary | ICD-10-CM

## 2017-09-27 DIAGNOSIS — I739 Peripheral vascular disease, unspecified: Secondary | ICD-10-CM

## 2017-09-27 DIAGNOSIS — K219 Gastro-esophageal reflux disease without esophagitis: Secondary | ICD-10-CM

## 2017-09-27 DIAGNOSIS — I10 Essential (primary) hypertension: Secondary | ICD-10-CM

## 2017-09-27 DIAGNOSIS — I63332 Cerebral infarction due to thrombosis of left posterior cerebral artery: Secondary | ICD-10-CM | POA: Diagnosis not present

## 2017-09-27 DIAGNOSIS — R6889 Other general symptoms and signs: Secondary | ICD-10-CM | POA: Diagnosis not present

## 2017-09-27 DIAGNOSIS — F419 Anxiety disorder, unspecified: Secondary | ICD-10-CM

## 2017-09-27 DIAGNOSIS — F411 Generalized anxiety disorder: Secondary | ICD-10-CM

## 2017-09-27 DIAGNOSIS — E782 Mixed hyperlipidemia: Secondary | ICD-10-CM | POA: Diagnosis not present

## 2017-09-27 DIAGNOSIS — Z6829 Body mass index (BMI) 29.0-29.9, adult: Secondary | ICD-10-CM

## 2017-09-27 DIAGNOSIS — E1122 Type 2 diabetes mellitus with diabetic chronic kidney disease: Secondary | ICD-10-CM | POA: Diagnosis not present

## 2017-09-27 DIAGNOSIS — K222 Esophageal obstruction: Secondary | ICD-10-CM

## 2017-09-27 DIAGNOSIS — Z8673 Personal history of transient ischemic attack (TIA), and cerebral infarction without residual deficits: Secondary | ICD-10-CM

## 2017-09-27 DIAGNOSIS — R1310 Dysphagia, unspecified: Secondary | ICD-10-CM

## 2017-09-27 DIAGNOSIS — Z0000 Encounter for general adult medical examination without abnormal findings: Secondary | ICD-10-CM

## 2017-09-27 DIAGNOSIS — K5730 Diverticulosis of large intestine without perforation or abscess without bleeding: Secondary | ICD-10-CM

## 2017-09-27 MED ORDER — INSULIN NPH ISOPHANE & REGULAR (70-30) 100 UNIT/ML ~~LOC~~ SUSP: SUBCUTANEOUS | 1 refills | Status: DC

## 2017-09-27 MED ORDER — CLONAZEPAM 0.5 MG PO TABS: ORAL_TABLET | ORAL | 0 refills | Status: DC

## 2017-09-27 MED ORDER — CITALOPRAM HYDROBROMIDE 40 MG PO TABS: ORAL_TABLET | ORAL | 1 refills | Status: DC

## 2017-09-27 MED ORDER — OMEPRAZOLE 40 MG PO CPDR: 40.0000 mg | DELAYED_RELEASE_CAPSULE | Freq: Every day | ORAL | 2 refills | Status: DC

## 2017-09-27 MED ORDER — TELMISARTAN 80 MG PO TABS: 80.0000 mg | ORAL_TABLET | Freq: Every day | ORAL | 3 refills | Status: DC

## 2017-09-27 MED ORDER — SIMVASTATIN 20 MG PO TABS: ORAL_TABLET | ORAL | 2 refills | Status: DC

## 2017-09-27 NOTE — Patient Instructions (Signed)
Bring in sugar log and food log   Your A1C is a measure of your sugar over the past 3 months and is not affected by what you have eaten over the past few days. Diabetes increases your chances of stroke and heart attack over 300 % and is the leading cause of blindness and kidney failure in the [place]. Please make sure you decrease bad carbs like white bread, white rice, potatoes, corn, soft drinks, pasta, cereals, refined sugars, sweet tea, dried fruits, and fruit juice. Good carbs are okay to eat in moderation like sweet potatoes, brown rice, whole grain pasta/bread, most fruit (except dried fruit) and you can eat as many veggies as you want.   Greater than 6.5 is considered diabetic. Between 6.4 and 5.7 is prediabetic If your A1C is less than 5.7 you are NOT diabetic.  Targets for Glucose Readings: Time of Check Target for patients WITHOUT Diabetes Target for DIABETICS  Before Meals Less than 100  less than 150  Two hours after meals Less than 200  Less than 250    If your morning sugar is always below 100 then the issue is with your sugar spiking after meals. Try to take your blood sugar approximately 2 hours after eating, this number should be less than 200. If it is not, think about the foods that you ate and better choices you can make.      Bad carbs also include fruit juice, alcohol, and sweet tea. These are empty calories that do not signal to your brain that you are full.   Please remember the good carbs are still carbs which convert into sugar. So please measure them out no more than 1/2-1 cup of rice, oatmeal, pasta, and beans  Veggies are however free foods! Pile them on.   Not all fruit is created equal. Please see the list below, the fruit at the bottom is higher in sugars than the fruit at the top. Please avoid all dried fruits.

## 2017-09-28 LAB — CBC WITH DIFFERENTIAL/PLATELET
BASOS ABS: 91 {cells}/uL (ref 0–200)
Basophils Relative: 0.9 %
Eosinophils Absolute: 71 cells/uL (ref 15–500)
Eosinophils Relative: 0.7 %
HEMATOCRIT: 46.9 % — AB (ref 35.0–45.0)
Hemoglobin: 15.6 g/dL — ABNORMAL HIGH (ref 11.7–15.5)
LYMPHS ABS: 1959 {cells}/uL (ref 850–3900)
MCH: 31.4 pg (ref 27.0–33.0)
MCHC: 33.3 g/dL (ref 32.0–36.0)
MCV: 94.4 fL (ref 80.0–100.0)
MPV: 11.1 fL (ref 7.5–12.5)
Monocytes Relative: 6 %
NEUTROS PCT: 73 %
Neutro Abs: 7373 cells/uL (ref 1500–7800)
Platelets: 321 10*3/uL (ref 140–400)
RBC: 4.97 10*6/uL (ref 3.80–5.10)
RDW: 11.7 % (ref 11.0–15.0)
Total Lymphocyte: 19.4 %
WBC mixed population: 606 cells/uL (ref 200–950)
WBC: 10.1 10*3/uL (ref 3.8–10.8)

## 2017-09-28 LAB — MAGNESIUM: MAGNESIUM: 1.9 mg/dL (ref 1.5–2.5)

## 2017-09-28 LAB — HEPATIC FUNCTION PANEL
AG RATIO: 1.1 (calc) (ref 1.0–2.5)
ALBUMIN MSPROF: 3.7 g/dL (ref 3.6–5.1)
ALT: 16 U/L (ref 6–29)
AST: 18 U/L (ref 10–35)
Alkaline phosphatase (APISO): 111 U/L (ref 33–130)
BILIRUBIN DIRECT: 0.1 mg/dL (ref 0.0–0.2)
BILIRUBIN TOTAL: 0.4 mg/dL (ref 0.2–1.2)
Globulin: 3.4 g/dL (calc) (ref 1.9–3.7)
Indirect Bilirubin: 0.3 mg/dL (calc) (ref 0.2–1.2)
Total Protein: 7.1 g/dL (ref 6.1–8.1)

## 2017-09-28 LAB — BASIC METABOLIC PANEL WITH GFR
BUN/Creatinine Ratio: 15 (calc) (ref 6–22)
BUN: 21 mg/dL (ref 7–25)
CALCIUM: 9.6 mg/dL (ref 8.6–10.4)
CO2: 31 mmol/L (ref 20–32)
Chloride: 99 mmol/L (ref 98–110)
Creat: 1.41 mg/dL — ABNORMAL HIGH (ref 0.60–0.93)
GFR, EST AFRICAN AMERICAN: 44 mL/min/{1.73_m2} — AB (ref >=60)
GFR, EST NON AFRICAN AMERICAN: 38 mL/min/{1.73_m2} — AB (ref >=60)
Glucose, Bld: 298 mg/dL — ABNORMAL HIGH (ref 65–99)
POTASSIUM: 5 mmol/L (ref 3.5–5.3)
Sodium: 139 mmol/L (ref 135–146)

## 2017-09-28 LAB — LIPID PANEL
Cholesterol: 267 mg/dL — ABNORMAL HIGH (ref <200)
HDL: 53 mg/dL (ref >50)
LDL CHOLESTEROL (CALC): 182 mg/dL — AB
NON-HDL CHOLESTEROL (CALC): 214 mg/dL — AB (ref <130)
Total CHOL/HDL Ratio: 5 (calc) — ABNORMAL HIGH (ref <5.0)
Triglycerides: 167 mg/dL — ABNORMAL HIGH (ref <150)

## 2017-09-28 LAB — TSH: TSH: 1.7 m[IU]/L (ref 0.40–4.50)

## 2017-09-28 LAB — HEMOGLOBIN A1C
HEMOGLOBIN A1C: 10 %{Hb} — AB (ref <5.7)
Mean Plasma Glucose: 240 (calc)
eAG (mmol/L): 13.3 (calc)

## 2017-10-05 ENCOUNTER — Encounter: Payer: Self-pay | Admitting: Internal Medicine

## 2017-10-05 DIAGNOSIS — R1313 Dysphagia, pharyngeal phase: Secondary | ICD-10-CM | POA: Diagnosis not present

## 2017-10-05 DIAGNOSIS — R490 Dysphonia: Secondary | ICD-10-CM | POA: Diagnosis not present

## 2017-10-05 LAB — HM MAMMOGRAPHY: HM Mammogram: NORMAL (ref 0–4)

## 2017-10-10 ENCOUNTER — Ambulatory Visit
Admission: RE | Admit: 2017-10-10 | Discharge: 2017-10-10 | Disposition: A | Discharge status: Home / Self Care | Payer: Medicare Other | Source: Ambulatory Visit | Attending: Physician Assistant | Admitting: Physician Assistant

## 2017-10-10 ENCOUNTER — Encounter: Payer: Self-pay | Admitting: Physician Assistant

## 2017-10-10 DIAGNOSIS — I700 Atherosclerosis of aorta: Secondary | ICD-10-CM | POA: Insufficient documentation

## 2017-10-10 DIAGNOSIS — J439 Emphysema, unspecified: Secondary | ICD-10-CM | POA: Insufficient documentation

## 2017-10-10 DIAGNOSIS — R911 Solitary pulmonary nodule: Secondary | ICD-10-CM | POA: Insufficient documentation

## 2017-10-10 DIAGNOSIS — Z720 Tobacco use: Secondary | ICD-10-CM

## 2017-10-19 ENCOUNTER — Encounter: Payer: Self-pay | Admitting: Podiatry

## 2017-10-19 ENCOUNTER — Ambulatory Visit (INDEPENDENT_AMBULATORY_CARE_PROVIDER_SITE_OTHER): Payer: Medicare Other | Admitting: Podiatry

## 2017-10-19 DIAGNOSIS — B351 Tinea unguium: Secondary | ICD-10-CM | POA: Diagnosis not present

## 2017-10-19 DIAGNOSIS — L84 Corns and callosities: Secondary | ICD-10-CM

## 2017-10-19 DIAGNOSIS — E1159 Type 2 diabetes mellitus with other circulatory complications: Secondary | ICD-10-CM | POA: Diagnosis not present

## 2017-10-19 DIAGNOSIS — M79676 Pain in unspecified toe(s): Secondary | ICD-10-CM | POA: Diagnosis not present

## 2017-10-19 DIAGNOSIS — M2042 Other hammer toe(s) (acquired), left foot: Secondary | ICD-10-CM

## 2017-10-19 DIAGNOSIS — M2041 Other hammer toe(s) (acquired), right foot: Secondary | ICD-10-CM

## 2017-10-19 NOTE — Progress Notes (Signed)
Patient ID: [name], female   DOB: Feb 21, 1946, 72 y.o.   MRN: [id number] Complaint:  Visit Type: Patient returns to my office for continued preventative foot care services. Complaint: Patient states" my nails have grown long and thick and become painful to walk and wear shoes" Patient has been diagnosed with DM with neuropathy.. The patient presents for preventative foot care services. No changes to ROS  Podiatric Exam: Vascular: dorsalis pedis and posterior tibial pulses are palpable bilateral. Capillary return is immediate. Temperature gradient is WNL. Skin turgor WNL  Sensorium: Diminished Semmes Weinstein monofilament test. Normal tactile sensation bilaterally. Nail Exam: Pt has thick disfigured discolored nails with subungual debris noted bilateral entire nail hallux . Ulcer Exam: There is no evidence of ulcer or pre-ulcerative changes or infection. Orthopedic Exam: Muscle tone and strength are WNL. No limitations in general ROM. No crepitus or effusions noted. Foot type and digits show no abnormalities. Bony prominences are unremarkable. Hammer toes B/L with plantarflexed metatarsals B/L.  Adductovarus fifth toe right foot. Skin: No Porokeratosis. No infection or ulcers.  Listers corn noted fifth toe right foot. Porokeratosis sub 3 left and sub 5 right. Diagnosis:  Onychomycosis, , Pain in right toe, pain in left toes Corn/porokeratosis  B/L  Treatment & Plan Procedures and Treatment: Consent by patient was obtained for treatment procedures. The patient understood the discussion of treatment and procedures well. All questions were answered thoroughly reviewed. Debridement of mycotic and hypertrophic toenails, 1 through 5 bilateral and clearing of subungual debris. No ulceration, no infection noted.  Debride listers corn. ABN signed for 2019. Return Visit-Office Procedure: Patient instructed to return to the office for a follow up visit 3 months for continued evaluation and  treatment.    Gardiner Barefoot DPM

## 2017-10-31 ENCOUNTER — Ambulatory Visit: Payer: Self-pay | Admitting: Physician Assistant

## 2017-10-31 ENCOUNTER — Other Ambulatory Visit: Payer: Self-pay

## 2017-10-31 DIAGNOSIS — E113291 Type 2 diabetes mellitus with mild nonproliferative diabetic retinopathy without macular edema, right eye: Secondary | ICD-10-CM | POA: Diagnosis not present

## 2017-10-31 DIAGNOSIS — E118 Type 2 diabetes mellitus with unspecified complications: Secondary | ICD-10-CM | POA: Diagnosis not present

## 2017-10-31 DIAGNOSIS — H40023 Open angle with borderline findings, high risk, bilateral: Secondary | ICD-10-CM | POA: Diagnosis not present

## 2017-10-31 DIAGNOSIS — E113292 Type 2 diabetes mellitus with mild nonproliferative diabetic retinopathy without macular edema, left eye: Secondary | ICD-10-CM | POA: Diagnosis not present

## 2017-10-31 LAB — HM DIABETES EYE EXAM

## 2017-10-31 MED ORDER — AMLODIPINE BESYLATE 10 MG PO TABS: 10.0000 mg | ORAL_TABLET | Freq: Every day | ORAL | 1 refills | Status: DC

## 2017-11-02 ENCOUNTER — Encounter: Payer: Self-pay | Admitting: *Deleted

## 2017-12-01 ENCOUNTER — Other Ambulatory Visit: Payer: Self-pay

## 2017-12-01 DIAGNOSIS — I10 Essential (primary) hypertension: Secondary | ICD-10-CM

## 2017-12-01 MED ORDER — TELMISARTAN 80 MG PO TABS: 80.0000 mg | ORAL_TABLET | Freq: Every day | ORAL | 0 refills | Status: DC

## 2017-12-30 NOTE — Progress Notes (Signed)
Patient ID: [name], female   DOB: 1946/06/20, 72 y.o.   MRN: [id number]  Assessment and Plan:    Essential hypertension - continue medications, DASH diet, exercise and monitor at home. Call if greater than 130/80.  -     CBC with Differential/Platelet -     BASIC METABOLIC PANEL WITH GFR -     Hepatic function panel -     TSH  Peripheral vascular disease due to secondary diabetes mellitus ([place]) Discussed general issues about diabetes pathophysiology and management., Educational material distributed., Suggested low cholesterol diet., Encouraged aerobic exercise., Discussed foot care., Reminded to get yearly retinal exam. -having some hypoglycemia- will do 20 at night and 25 in the AM -     Lipid panel  Cerebrovascular accident (CVA) due to thrombosis of left posterior cerebral artery ([place]) Control blood pressure, cholesterol, glucose, increase exercise.   PVD (peripheral vascular disease) ([facility]) Control blood pressure, cholesterol, glucose, increase exercise.  -     Lipid panel  CKD stage 3 due to type 2 diabetes mellitus ([place]) Discussed general issues about diabetes pathophysiology and management., Educational material distributed., Suggested low cholesterol diet., Encouraged aerobic exercise., Discussed foot care., Reminded to get yearly retinal exam. -having some hypoglycemia- will do 20 at night and 25 in the AM -     BASIC METABOLIC PANEL WITH GFR  Insulin-requiring or dependent type II diabetes mellitus ([place]) Discussed need to try to do morning and night, morning should be higher, not better -     Hemoglobin A1c  Medication management -     Magnesium  Tobacco abuse Trying to quit, cutting back, 1 pack will last 3 days.   Depression, major, recurrent, in partial remission ([place]) - continue medications, stress management techniques discussed, increase water, good sleep hygiene discussed, increase exercise, and increase veggies.   Hyperlipidemia, unspecified  hyperlipidemia type -continue medications, check lipids, decrease fatty foods, increase activity.  -     Lipid panel  COPD Given samples of anoro Counseled on smoking cessation  Hoarseness Has seen ENT, has had CT neck Continues to smoke , no dysphagia, counseled on quitting.  Get on allergy pill, stop smoking, stay on prilosec  Continue diet and meds as discussed. Further disposition pending results of labs. Discussed med's effects and SE's.    Future Appointments  Date Time Provider [place]  01/18/2018  8:45 AM Gardiner Barefoot, DPM [facility]  03/27/2018  8:00 AM [facility]  03/27/2018  8:45 AM Nickel, Sharmon Leyden, NP [facility]  04/18/2018 11:00 AM [name], MD [facility] None  07/19/2018  8:45 AM [name], PA-C [facility] None  11/13/2018 10:30 AM [name], PA-C [facility] None     HPI 72 y.o. female  presents for 3 month follow up with hypertension, hyperlipidemia, diabetes and vitamin D deficiency.  Her blood pressure has been controlled at home, today their BP is BP: 136/80.She does not workout. She denies chest pain, shortness of breath, dizziness.  She has history of PVD s/p fem-pop bypass and stent due to DM2.   Has history of CVA on 01/25/17, she has plavix allergy, so continued on ASA and zocor and aggrenox, she is following with neuro.   She has COPD, she is wheezing at this time, she has hoarseness. Had CT neck 01/2017 that showed nodules 2-3 mm nodules. Has seen Dr. Lucia Gaskins for the hoarsenss.    She is on cholesterol medication and denies myalgias. Her cholesterol is  at goal. The cholesterol was:  09/27/2017: Cholesterol 267; HDL 53; LDL Cholesterol (Calc) 182; Triglycerides 167   She has bilateral shoulder pain, has had surgery's does not want to go to ortho at this time.    She has been working on diet and exercise for diabetes with diabetic chronic kidney disease, with other circulatory complications and with  diabetic polyneuropathy, she is on ASA, she is on ACE/ARB, she is on 25 units in the AM and 25 at night 70/30 mix BID- she has had some low sugars in the morning, woke up once due to a low sugar, could not tolerate invokana due to CKD and denies  foot ulcerations, hypoglycemia , increased appetite, nausea, paresthesia of the feet, polydipsia, polyuria, visual disturbances, vomiting and weight loss. Last A1C was: 09/27/2017: Hgb A1c MFr Bld 10.0.  She saw Dr. Dwyane Dee once but has not been back.  Lab Results  Component Value Date   GFRAA 44 (L) 09/27/2017  Patient is on Vitamin D supplement.  BMI is Body mass index is 30.28 kg/m., she is working on diet and exercise. Wt Readings from Last 3 Encounters:  01/03/18 187 lb 9.6 oz (85.1 kg)  09/27/17 184 lb 6.4 oz (83.6 kg)  09/26/17 185 lb 11.2 oz (84.2 kg)   She takes klonopin at night only to help with sleep.   Current Medications:  Current Outpatient Medications on File Prior to Visit  Medication Sig Dispense Refill  . amLODipine (NORVASC) 10 MG tablet Take 1 tablet (10 mg total) by mouth daily. 90 tablet 1  . aspirin EC 81 MG tablet Take 81 mg by mouth daily.    . Blood Glucose Monitoring Suppl (ACCU-CHEK AVIVA PLUS) W/DEVICE KIT Check blood sugar up to three times daily 1 kit 0  . CINNAMON PO Take by mouth.    . citalopram (CELEXA) 40 MG tablet TAKE 1 TABLET DAILY FOR MOOD & ANXIETY 90 tablet 1  . clonazePAM (KLONOPIN) 0.5 MG tablet Take 1/2 to 1 tablet 1 or 2 x/dily only if needed for severe anxiety 180 tablet 0  . glucose blood (ACCU-CHEK AVIVA PLUS) test strip Use daily to check BS TID Dx. E11.22 300 each 1  . insulin NPH-regular Human (NOVOLIN 70/30) (70-30) 100 UNIT/ML injection INJECT 25 UNITS IN THE AM [place] WITH FOOD, AND INJECT 25 UNITS [place] WITH EVENING MEAL. 60 mL 1  . Insulin Syringe-Needle U-100 31G X 15/64" 0.5 ML MISC Use two pens daily with insulin 100 each 3  . Omega-3 Fatty Acids (FISH OIL) 1200 MG CAPS Take 1 capsule by mouth  daily.    Marland Kitchen omeprazole (PRILOSEC) 40 MG capsule Take 1 capsule (40 mg total) by mouth daily. 90 capsule 2  . OVER THE COUNTER MEDICATION Take by mouth daily. One a day vitamin    . simvastatin (ZOCOR) 20 MG tablet Take 1 tablet at Bedtime for Cholesterol 90 tablet 2  . telmisartan (MICARDIS) 80 MG tablet Take 1 tablet (80 mg total) by mouth daily. 90 tablet 0   No current facility-administered medications on file prior to visit.    Medical History:  Past Medical History:  Diagnosis Date  . Abnormal findings on esophagogastroduodenoscopy (EGD) 07/2010  . 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 Anxiety   . Colon polyps   . Diabetes mellitus 1998  . Diverticulosis   . GERD (gastroesophageal reflux disease)   . Hemorrhoids   .  Hiatal hernia   . Hyperlipidemia   . Hypertension 2000  . Migraines   . Peripheral vascular disease ([place])   . Phlebitis    30 years ago  left leg  . Status post colonoscopy 07/2010  . Stroke [facility])    multiple mini strokes ( brain aneurysm )   Allergies:  Allergies  Allergen Reactions  . Plavix [Clopidogrel Bisulfate] Palpitations  . Amoxicillin Itching and Swelling    FACE & EYES SWELL  . Ace Inhibitors   . Ciprofloxacin Hcl Swelling  . Lyrica [Pregabalin]     Itch, gain weight  . Penicillins Other (See Comments)    REACTION: unspecified     Review of Systems:  Review of Systems  Constitutional: Negative for chills, fever and malaise/fatigue.  HENT: Negative for congestion, ear pain and sore throat.   Eyes: Negative.   Respiratory: Negative for cough, shortness of breath and wheezing.   Cardiovascular: Negative for chest pain, palpitations and leg swelling.  Gastrointestinal: Positive for abdominal pain and vomiting. Negative for blood in stool, constipation, diarrhea, heartburn, melena and nausea.  Genitourinary: Negative.   Skin: Negative.   Neurological: Positive for sensory change (left leg, left face).  Negative for dizziness, loss of consciousness and headaches.  Psychiatric/Behavioral: Negative for depression. The patient is not nervous/anxious and does not have insomnia.     Family history- Review and unchanged  Social history- Review and unchanged  Physical Exam: BP 136/80   Pulse 71   Temp 97.7 F (36.5 C)   Resp 14   Ht 5' 6"  (1.676 m)   Wt 187 lb 9.6 oz (85.1 kg)   SpO2 97%   BMI 30.28 kg/m  Wt Readings from Last 3 Encounters:  01/03/18 187 lb 9.6 oz (85.1 kg)  09/27/17 184 lb 6.4 oz (83.6 kg)  09/26/17 185 lb 11.2 oz (84.2 kg)   General Appearance: Well nourished well developed, non-toxic appearing, in no apparent distress. Eyes: PERRLA, EOMs, conjunctiva no swelling or erythema ENT/Mouth: Ear canals clear with no erythema, swelling, or discharge.  TMs normal bilaterally, oropharynx clear, moist, with no exudate.   Neck: Supple, thyroid normal, no JVD, no cervical adenopathy.  Respiratory: Respiratory effort normal, breath sounds clear A&P, no wheeze, rhonchi or rales noted.  No retractions, no accessory muscle usage Cardio: RRR with no MRGs. No noted edema.  Abdomen: Soft, + BS.  Non tender, no guarding, rebound, hernias, masses. Musculoskeletal: Full ROM, 5/5 strength, Normal gait Skin: Warm, dry without rashes, lesions, ecchymosis.  Neuro: Awake and oriented X 3, Cranial nerves intact. No cerebellar symptoms.  Psych: normal affect, Insight and Judgment appropriate.    [name], PA-C 9:04 AM [facility]

## 2018-01-03 ENCOUNTER — Ambulatory Visit (INDEPENDENT_AMBULATORY_CARE_PROVIDER_SITE_OTHER): Payer: Medicare Other | Admitting: Physician Assistant

## 2018-01-03 ENCOUNTER — Encounter: Payer: Self-pay | Admitting: Physician Assistant

## 2018-01-03 VITALS — BP 136/80 | HR 71 | Temp 97.7°F | Resp 14 | Ht 66.0 in | Wt 187.6 lb

## 2018-01-03 DIAGNOSIS — E1122 Type 2 diabetes mellitus with diabetic chronic kidney disease: Secondary | ICD-10-CM | POA: Diagnosis not present

## 2018-01-03 DIAGNOSIS — J439 Emphysema, unspecified: Secondary | ICD-10-CM | POA: Diagnosis not present

## 2018-01-03 DIAGNOSIS — I700 Atherosclerosis of aorta: Secondary | ICD-10-CM

## 2018-01-03 DIAGNOSIS — N183 Chronic kidney disease, stage 3 unspecified: Secondary | ICD-10-CM

## 2018-01-03 DIAGNOSIS — Z794 Long term (current) use of insulin: Secondary | ICD-10-CM | POA: Diagnosis not present

## 2018-01-03 DIAGNOSIS — E119 Type 2 diabetes mellitus without complications: Secondary | ICD-10-CM

## 2018-01-03 DIAGNOSIS — I10 Essential (primary) hypertension: Secondary | ICD-10-CM

## 2018-01-03 DIAGNOSIS — F3341 Major depressive disorder, recurrent, in partial remission: Secondary | ICD-10-CM

## 2018-01-03 DIAGNOSIS — I739 Peripheral vascular disease, unspecified: Secondary | ICD-10-CM | POA: Diagnosis not present

## 2018-01-03 DIAGNOSIS — E1351 Other specified diabetes mellitus with diabetic peripheral angiopathy without gangrene: Secondary | ICD-10-CM | POA: Diagnosis not present

## 2018-01-03 DIAGNOSIS — I779 Disorder of arteries and arterioles, unspecified: Secondary | ICD-10-CM

## 2018-01-03 DIAGNOSIS — E782 Mixed hyperlipidemia: Secondary | ICD-10-CM | POA: Diagnosis not present

## 2018-01-03 MED ORDER — INSULIN NPH ISOPHANE & REGULAR (70-30) 100 UNIT/ML ~~LOC~~ SUSP: SUBCUTANEOUS | 1 refills | Status: DC

## 2018-01-03 NOTE — Patient Instructions (Addendum)
Please pick one of the over the counter allergy medications below and take it once daily for allergies.  Claritin or loratadine cheapest but likely the weakest  Zyrtec or certizine at night because it can make you sleepy The strongest is allegra or fexafinadine  Cheapest at [employer], [employer], [employer]  , NEED TO DO DAILY for samples inhaler, this is NOT a rescue inhaler so if you are acutely short of breath please use your albuterol or call 911.  Do 1 puff once a day.   Go to the ER if any chest pain, shortness of breath, nausea, dizziness, severe HA, changes vision/speech  Common causes of cough OR hoarseness OR sore throat:  Stay on the prilosec  Start on allergy pill   sometimes irritation causes more irritation. Try voice rest, use sugar free cough drops to prevent coughing, and try to stop clearing your throat.     Please be careful with insulin.  Switch your night time insulin to 20 at night and continue to monitor sugars.    If at any time you start to have low blood sugars in the morning or during the day please stop this medication. Please never take this medication if you are sick or can not eat. A low blood sugar is much more dangerous than a high blood sugar. Your brain needs two things, sugar and oxygen.    What does your A1C results mean?  Your A1C is a measure of your sugar over the past 3 months   Use this chart as a guide to compare the results of your A1C blood test to your estimated average daily blood sugar:  A1C Range Average Sugar  4.0-6.0% 60-120 mg/dl  6.1-7.0% 121 - 150 mg/dl  7.1-8.0% 151-180 mg/dl  8.1-9.0% 181-210 mg/dl  10.1-11% 211-240 mg/dl  11.1-12.0% 271-300 mg/dl  12.1-13.0% 301-330 mg/dl  13.1-14.0% 331-360 mg/dl  Greater than 14.0% Greater than 360 mg/dl      Diabetes or even increased sugars put you at 300% increased risk of heart attack and stroke.  ALSO BEING DIABETIC YOU MAY NOT HAVE ANY PAIN WITH A HEART ATTACK.  Even worse of a chance  of no pain if you are a woman.  It is very unlikely that you will have any pain with a heart attack. Likely your symptoms will be very subtle, even for very severe disease.  Your symptoms for a heart attack will likely occur when you exert your self or exercise and include: Shortness of breath Sweating Nausea Dizziness Fast or irregular heart beats Fatigue   It makes me feel better if my diabetics get their heart rate up with exercise once or twice a week and pay close attention to your body. If there is ANY change in your exercise capacity or if you have symptoms above, please STOP and call 911 or call to come to the office.   PLEASE REMEMBER:  Diabetes is preventable! Up to 66 percent of complications and morbidities among individuals with type 2 diabetes can be prevented, delayed, or effectively treated and minimized with regular visits to a health professional, appropriate monitoring and medication, and a healthy diet and lifestyle.   Here is some information to help you keep your heart healthy: Move it! - Aim for 30 mins of activity every day. Take it slowly at first. Talk to [place] before starting any new exercise program.   Lose it.  -Body Mass Index (BMI) can indicate if you need to lose weight. A healthy range is  18.5-24.9. For a BMI calculator, go to [employer].com  Waist Management -Excess abdominal fat is a risk factor for heart disease, diabetes, asthma, stroke and more. Ideal waist circumference is less than 35" for women and less than 40" for men.   Eat Right -focus on fruits, vegetables, whole grains, and meals you make yourself. Avoid foods with trans fat and high sugar/sodium content.   Snooze or Snore? - Loud snoring can be a sign of sleep apnea, a significant risk factor for high blood pressure, heart attach, stroke, and heart arrhythmias.  Kick the habit -Quit Smoking! Avoid second hand smoke. A single cigarette raises your blood pressure for 20 mins and increases  the risk of heart attack and stroke for the next 24 hours.   Are Aspirin and Supplements right for you? -Add ENTERIC COATED low dose 81 mg Aspirin daily OR can do every other day if you have easy bruising to protect your heart and head. As well as to reduce risk of Colon Cancer by 20 %, Skin Cancer by 26 % , Melanoma by 46% and Pancreatic cancer by 60%  Say "No to Stress -There may be little you can do about problems that cause stress. However, techniques such as long walks, meditation, and exercise can help you manage it.   Start Now! - Make changes one at a time and set reasonable goals to increase your likelihood of success.     Chronic Obstructive Pulmonary Disease Chronic obstructive pulmonary disease (COPD) is a long-term (chronic) condition that affects the lungs. COPD is a general term that can be used to describe many different lung problems that cause lung swelling (inflammation) and limit airflow, including chronic bronchitis and emphysema. If you have COPD, your lung function will probably never return to normal. In most cases, it gets worse over time. However, there are steps you can take to slow the progression of the disease and improve your quality of life. What are the causes? This condition may be caused by:  Smoking. This is the most common cause.  Certain genes passed down through families.  What increases the risk? The following factors may make you more likely to develop this condition:  Secondhand smoke from cigarettes, pipes, or cigars.  Exposure to chemicals and other irritants such as fumes and dust in the work environment.  Chronic lung conditions or infections.  What are the signs or symptoms? Symptoms of this condition include:  Shortness of breath, especially during physical activity.  Chronic cough with a large amount of thick mucus. Sometimes the cough may not have any mucus (dry cough).  Wheezing.  Rapid breaths.  Gray or bluish discoloration  (cyanosis) of the skin, especially in your fingers, toes, or lips.  Feeling tired (fatigue).  Weight loss.  Chest tightness.  Frequent infections.  Episodes when breathing symptoms become much worse (exacerbations).  Swelling in the ankles, feet, or legs. This may occur in later stages of the disease.  How is this diagnosed? This condition is diagnosed based on:  Your medical history.  A physical exam.  You may also have tests, including:  Lung (pulmonary) function tests. This may include a spirometry test, which measures your ability to exhale properly.  Chest X-ray.  CT scan.  Blood tests.  How is this treated? This condition may be treated with:  Medicines. These may include inhaled rescue medicines to treat acute exacerbations as well as long-term, or maintenance, medicines to prevent flare-ups of COPD. ? Bronchodilators help treat COPD by  dilating the airways to allow increased airflow and make your breathing more comfortable. ? Steroids can reduce airway inflammation and help prevent exacerbations.  Smoking cessation. If you smoke, your health care provider may ask you to quit, and may also recommend therapy or replacement products to help you quit.  Pulmonary rehabilitation. This may involve working with a team of health care providers and specialists, such as respiratory, occupational, and physical therapists.  Exercise and physical activity. These are beneficial for nearly all people with COPD.  Nutrition therapy to gain weight, if you are underweight.  Oxygen. Supplemental oxygen therapy is only helpful if you have a low oxygen level in your blood (hypoxemia).  Lung surgery or transplant.  Palliative care. This is to help people with COPD feel comfortable when treatment is no longer working.  Follow these instructions at home: Medicines  Take over-the-counter and prescription medicines (inhaled or pills) only as told by your health care  provider.  Talk to your health care provider before taking any cough or allergy medicines. You may need to avoid certain medicines that dry out your airways. Lifestyle  If you are a smoker, the most important thing that you can do is to stop smoking. Do not use any products that contain nicotine or tobacco, such as cigarettes and e-cigarettes. If you need help quitting, ask your health care provider. Continuing to smoke will cause the disease to progress faster.  Avoid exposure to things that irritate your lungs, such as smoke, chemicals, and fumes.  Stay active, but balance activity with periods of rest. Exercise and physical activity will help you maintain your ability to do things you want to do.  Learn and use relaxation techniques to manage stress and to control your breathing.  Get the right amount of sleep and get quality sleep. Most adults need 7 or more hours per night.  Eat healthy foods. Eating smaller, more frequent meals and resting before meals may help you maintain your strength. Controlled breathing Learn and use controlled breathing techniques as directed by your health care provider. Controlled breathing techniques include:  Pursed lip breathing. Start by breathing in (inhaling) through your nose for 1 second. Then, purse your lips as if you were going to whistle and breathe out (exhale) through the pursed lips for 2 seconds.  Diaphragmatic breathing. Start by putting one hand on your abdomen just above your waist. Inhale slowly through your nose. The hand on your abdomen should move out. Then purse your lips and exhale slowly. You should be able to feel the hand on your abdomen moving in as you exhale.  Controlled coughing Learn and use controlled coughing to clear mucus from your lungs. Controlled coughing is a series of short, progressive coughs. The steps of controlled coughing are: 1. Lean your head slightly forward. 2. Breathe in deeply using diaphragmatic  breathing. 3. Try to hold your breath for 3 seconds. 4. Keep your mouth slightly open while coughing twice. 5. Spit any mucus out into a tissue. 6. Rest and repeat the steps once or twice as needed.  General instructions  Make sure you receive all the vaccines that your health care provider recommends, especially the pneumococcal and influenza vaccines. Preventing infection and hospitalization is very important when you have COPD.  Use oxygen therapy and pulmonary rehabilitation if directed to by your health care provider. If you require home oxygen therapy, ask your health care provider whether you should purchase a pulse oximeter to measure your oxygen level at home.  Work with your health care provider to develop a COPD action plan. This will help you know what steps to take if your condition gets worse.  Keep other chronic health conditions under control as told by your health care provider.  Avoid extreme temperature and humidity changes.  Avoid contact with people who have an illness that spreads from person to person (is contagious), such as viral infections or pneumonia.  Keep all follow-up visits as told by your health care provider. This is important. Contact a health care provider if:  You are coughing up more mucus than usual.  There is a change in the color or thickness of your mucus.  Your breathing is more labored than usual.  Your breathing is faster than usual.  You have difficulty sleeping.  You need to use your rescue medicines or inhalers more often than expected.  You have trouble doing routine activities such as getting dressed or walking around the house. Get help right away if:  You have shortness of breath while you are resting.  You have shortness of breath that prevents you from: ? Being able to talk. ? Performing your usual physical activities.  You have chest pain lasting longer than 5 minutes.  Your skin color is more blue (cyanotic) than  usual.  You measure low oxygen saturations for longer than 5 minutes with a pulse oximeter.  You have a fever.  You feel too tired to breathe normally. Summary  Chronic obstructive pulmonary disease (COPD) is a long-term (chronic) condition that affects the lungs.  Your lung function will probably never return to normal. In most cases, it gets worse over time. However, there are steps you can take to slow the progression of the disease and improve your quality of life.  Treatment for COPD may include taking medicines, quitting smoking, pulmonary rehabilitation, and changes to diet and exercise. As the disease progresses, you may need oxygen therapy, a lung transplant, or palliative care.  To help manage your condition, do not smoke, avoid exposure to things that irritate your lungs, stay up to date on all vaccines, and follow your health care provider's instructions for taking medicines. This information is not intended to replace advice given to you by your health care provider. Make sure you discuss any questions you have with your health care provider. Document Released: 06/16/2005 Document Revised: 10/11/2016 Document Reviewed: 10/11/2016 Elsevier Interactive Patient Education  [employer].

## 2018-01-04 LAB — LIPID PANEL
Cholesterol: 236 mg/dL — ABNORMAL HIGH (ref <200)
HDL: 54 mg/dL (ref >50)
LDL Cholesterol (Calc): 161 mg/dL (calc) — ABNORMAL HIGH
NON-HDL CHOLESTEROL (CALC): 182 mg/dL — AB (ref <130)
TRIGLYCERIDES: 101 mg/dL (ref <150)
Total CHOL/HDL Ratio: 4.4 (calc) (ref <5.0)

## 2018-01-04 LAB — CBC WITH DIFFERENTIAL/PLATELET
Basophils Absolute: 82 cells/uL (ref 0–200)
Basophils Relative: 0.9 %
EOS ABS: 91 {cells}/uL (ref 15–500)
Eosinophils Relative: 1 %
HEMATOCRIT: 45.8 % — AB (ref 35.0–45.0)
Hemoglobin: 15.1 g/dL (ref 11.7–15.5)
LYMPHS ABS: 2402 {cells}/uL (ref 850–3900)
MCH: 30.8 pg (ref 27.0–33.0)
MCHC: 33 g/dL (ref 32.0–36.0)
MCV: 93.3 fL (ref 80.0–100.0)
MPV: 10.8 fL (ref 7.5–12.5)
Monocytes Relative: 6.8 %
Neutro Abs: 5906 cells/uL (ref 1500–7800)
Neutrophils Relative %: 64.9 %
Platelets: 295 10*3/uL (ref 140–400)
RBC: 4.91 10*6/uL (ref 3.80–5.10)
RDW: 12.2 % (ref 11.0–15.0)
Total Lymphocyte: 26.4 %
WBC mixed population: 619 cells/uL (ref 200–950)
WBC: 9.1 10*3/uL (ref 3.8–10.8)

## 2018-01-04 LAB — BASIC METABOLIC PANEL WITH GFR
BUN / CREAT RATIO: 14 (calc) (ref 6–22)
BUN: 18 mg/dL (ref 7–25)
CO2: 34 mmol/L — ABNORMAL HIGH (ref 20–32)
CREATININE: 1.27 mg/dL — AB (ref 0.60–0.93)
Calcium: 9.6 mg/dL (ref 8.6–10.4)
Chloride: 105 mmol/L (ref 98–110)
GFR, EST AFRICAN AMERICAN: 49 mL/min/{1.73_m2} — AB (ref >=60)
GFR, Est Non African American: 42 mL/min/{1.73_m2} — ABNORMAL LOW (ref >=60)
GLUCOSE: 89 mg/dL (ref 65–99)
Potassium: 4.5 mmol/L (ref 3.5–5.3)
SODIUM: 144 mmol/L (ref 135–146)

## 2018-01-04 LAB — HEPATIC FUNCTION PANEL
AG Ratio: 1.2 (calc) (ref 1.0–2.5)
ALBUMIN MSPROF: 3.7 g/dL (ref 3.6–5.1)
ALT: 17 U/L (ref 6–29)
AST: 23 U/L (ref 10–35)
Alkaline phosphatase (APISO): 95 U/L (ref 33–130)
BILIRUBIN DIRECT: 0.1 mg/dL (ref 0.0–0.2)
BILIRUBIN INDIRECT: 0.4 mg/dL (ref 0.2–1.2)
BILIRUBIN TOTAL: 0.5 mg/dL (ref 0.2–1.2)
GLOBULIN: 3.1 g/dL (ref 1.9–3.7)
Total Protein: 6.8 g/dL (ref 6.1–8.1)

## 2018-01-04 LAB — HEMOGLOBIN A1C
EAG (MMOL/L): 10.6 (calc)
Hgb A1c MFr Bld: 8.3 % of total Hgb — ABNORMAL HIGH (ref <5.7)
Mean Plasma Glucose: 192 (calc)

## 2018-01-04 LAB — TSH: TSH: 1.92 mIU/L (ref 0.40–4.50)

## 2018-01-18 ENCOUNTER — Encounter: Payer: Self-pay | Admitting: Podiatry

## 2018-01-18 ENCOUNTER — Ambulatory Visit (INDEPENDENT_AMBULATORY_CARE_PROVIDER_SITE_OTHER): Payer: Medicare Other | Admitting: Podiatry

## 2018-01-18 DIAGNOSIS — M2042 Other hammer toe(s) (acquired), left foot: Secondary | ICD-10-CM

## 2018-01-18 DIAGNOSIS — M79676 Pain in unspecified toe(s): Secondary | ICD-10-CM

## 2018-01-18 DIAGNOSIS — E1159 Type 2 diabetes mellitus with other circulatory complications: Secondary | ICD-10-CM | POA: Diagnosis not present

## 2018-01-18 DIAGNOSIS — M2041 Other hammer toe(s) (acquired), right foot: Secondary | ICD-10-CM

## 2018-01-18 DIAGNOSIS — L84 Corns and callosities: Secondary | ICD-10-CM

## 2018-01-18 DIAGNOSIS — B351 Tinea unguium: Secondary | ICD-10-CM

## 2018-01-18 NOTE — Progress Notes (Signed)
Patient ID: [name], female   DOB: 10-20-1945, 71 y.o.   MRN: [id number] Complaint:  Visit Type: Patient returns to my office for continued preventative foot care services. Complaint: Patient states" my nails have grown long and thick and become painful to walk and wear shoes" Patient has been diagnosed with DM with neuropathy.. The patient presents for preventative foot care services. No changes to ROS  Podiatric Exam: Vascular: dorsalis pedis and posterior tibial pulses are palpable bilateral. Capillary return is immediate. Temperature gradient is WNL. Skin turgor WNL  Sensorium: Diminished Semmes Weinstein monofilament test. Normal tactile sensation bilaterally. Nail Exam: Pt has thick disfigured discolored nails with subungual debris noted bilateral entire nail hallux . Ulcer Exam: There is no evidence of ulcer or pre-ulcerative changes or infection. Orthopedic Exam: Muscle tone and strength are WNL. No limitations in general ROM. No crepitus or effusions noted. Foot type and digits show no abnormalities. Bony prominences are unremarkable. Hammer toes B/L with plantarflexed metatarsals B/L.  Adductovarus fifth toe right foot. Skin: No Porokeratosis. No infection or ulcers.  Listers corn noted fifth toe right foot.  Diagnosis:  Onychomycosis, , Pain in right toe, pain in left toes   B/L  Treatment & Plan Procedures and Treatment: Consent by patient was obtained for treatment procedures. The patient understood the discussion of treatment and procedures well. All questions were answered thoroughly reviewed. Debridement of mycotic and hypertrophic toenails, 1 through 5 bilateral and clearing of subungual debris. No ulceration, no infection noted.  Debride listers corn. ABN signed for 2019. Return Visit-Office Procedure: Patient instructed to return to the office for a follow up visit 3 months for continued evaluation and treatment.    Gardiner Barefoot DPM

## 2018-02-22 ENCOUNTER — Other Ambulatory Visit: Payer: Self-pay | Admitting: Physician Assistant

## 2018-02-22 DIAGNOSIS — I10 Essential (primary) hypertension: Secondary | ICD-10-CM

## 2018-03-21 ENCOUNTER — Other Ambulatory Visit: Payer: Self-pay

## 2018-03-21 DIAGNOSIS — I779 Disorder of arteries and arterioles, unspecified: Secondary | ICD-10-CM

## 2018-03-27 ENCOUNTER — Ambulatory Visit (HOSPITAL_COMMUNITY)
Admission: RE | Admit: 2018-03-27 | Discharge: 2018-03-27 | Disposition: A | Discharge status: Home / Self Care | Payer: Medicare Other | Source: Ambulatory Visit | Attending: Family | Admitting: Family

## 2018-03-27 ENCOUNTER — Encounter: Payer: Self-pay | Admitting: Family

## 2018-03-27 ENCOUNTER — Other Ambulatory Visit: Payer: Self-pay

## 2018-03-27 ENCOUNTER — Ambulatory Visit (INDEPENDENT_AMBULATORY_CARE_PROVIDER_SITE_OTHER): Payer: Medicare Other | Admitting: Family

## 2018-03-27 VITALS — BP 166/72 | HR 64 | Temp 97.5°F | Resp 16 | Ht 66.0 in | Wt 186.0 lb

## 2018-03-27 DIAGNOSIS — E1165 Type 2 diabetes mellitus with hyperglycemia: Secondary | ICD-10-CM

## 2018-03-27 DIAGNOSIS — E1151 Type 2 diabetes mellitus with diabetic peripheral angiopathy without gangrene: Secondary | ICD-10-CM

## 2018-03-27 DIAGNOSIS — IMO0002 Reserved for concepts with insufficient information to code with codable children: Secondary | ICD-10-CM

## 2018-03-27 DIAGNOSIS — I10 Essential (primary) hypertension: Secondary | ICD-10-CM | POA: Insufficient documentation

## 2018-03-27 DIAGNOSIS — R0989 Other specified symptoms and signs involving the circulatory and respiratory systems: Secondary | ICD-10-CM | POA: Diagnosis not present

## 2018-03-27 DIAGNOSIS — E119 Type 2 diabetes mellitus without complications: Secondary | ICD-10-CM | POA: Diagnosis not present

## 2018-03-27 DIAGNOSIS — I779 Disorder of arteries and arterioles, unspecified: Secondary | ICD-10-CM

## 2018-03-27 DIAGNOSIS — Z959 Presence of cardiac and vascular implant and graft, unspecified: Secondary | ICD-10-CM

## 2018-03-27 DIAGNOSIS — E785 Hyperlipidemia, unspecified: Secondary | ICD-10-CM | POA: Diagnosis not present

## 2018-03-27 DIAGNOSIS — F17200 Nicotine dependence, unspecified, uncomplicated: Secondary | ICD-10-CM | POA: Diagnosis not present

## 2018-03-27 NOTE — Patient Instructions (Signed)
Steps to Quit Smoking Smoking tobacco can be bad for your health. It can also affect almost every organ in your body. Smoking puts you and people around you at risk for many serious long-lasting (chronic) diseases. Quitting smoking is hard, but it is one of the best things that you can do for your health. It is never too late to quit. What are the benefits of quitting smoking? When you quit smoking, you lower your risk for getting serious diseases and conditions. They can include:  Lung cancer or lung disease.  Heart disease.  Stroke.  Heart attack.  Not being able to have children (infertility).  Weak bones (osteoporosis) and broken bones (fractures).  If you have coughing, wheezing, and shortness of breath, those symptoms may get better when you quit. You may also get sick less often. If you are pregnant, quitting smoking can help to lower your chances of having a baby of low birth weight. What can I do to help me quit smoking? Talk with your doctor about what can help you quit smoking. Some things you can do (strategies) include:  Quitting smoking totally, instead of slowly cutting back how much you smoke over a period of time.  Going to in-person counseling. You are more likely to quit if you go to many counseling sessions.  Using resources and support systems, such as: ? Online chats with a counselor. ? Phone quitlines. ? Printed self-help materials. ? Support groups or group counseling. ? Text messaging programs. ? Mobile phone apps or applications.  Taking medicines. Some of these medicines may have nicotine in them. If you are pregnant or breastfeeding, do not take any medicines to quit smoking unless your doctor says it is okay. Talk with your doctor about counseling or other things that can help you.  Talk with your doctor about using more than one strategy at the same time, such as taking medicines while you are also going to in-person counseling. This can help make  quitting easier. What things can I do to make it easier to quit? Quitting smoking might feel very hard at first, but there is a lot that you can do to make it easier. Take these steps:  Talk to your family and friends. Ask them to support and encourage you.  Call phone quitlines, reach out to support groups, or work with a counselor.  Ask people who smoke to not smoke around you.  Avoid places that make you want (trigger) to smoke, such as: ? Bars. ? Parties. ? Smoke-break areas at work.  Spend time with people who do not smoke.  Lower the stress in your life. Stress can make you want to smoke. Try these things to help your stress: ? Getting regular exercise. ? Deep-breathing exercises. ? Yoga. ? Meditating. ? Doing a body scan. To do this, close your eyes, focus on one area of your body at a time from head to toe, and notice which parts of your body are tense. Try to relax the muscles in those areas.  Download or buy apps on your mobile phone or tablet that can help you stick to your quit plan. There are many free apps, such as QuitGuide from the CDC (Centers for Disease Control and Prevention). You can find more support from smokefree.gov and other websites.  This information is not intended to replace advice given to you by your health care provider. Make sure you discuss any questions you have with your health care provider. Document Released: 07/03/2009 Document   Revised: 05/04/2016 Document Reviewed: 01/21/2015 Elsevier Interactive Patient Education  2018 Elsevier Inc.     Peripheral Vascular Disease Peripheral vascular disease (PVD) is a disease of the blood vessels that are not part of your heart and brain. A simple term for PVD is poor circulation. In most cases, PVD narrows the blood vessels that carry blood from your heart to the rest of your body. This can result in a decreased supply of blood to your arms, legs, and internal organs, like your stomach or kidneys.  However, it most often affects a person's lower legs and feet. There are two types of PVD.  Organic PVD. This is the more common type. It is caused by damage to the structure of blood vessels.  Functional PVD. This is caused by conditions that make blood vessels contract and tighten (spasm).  Without treatment, PVD tends to get worse over time. PVD can also lead to acute ischemic limb. This is when an arm or limb suddenly has trouble getting enough blood. This is a medical emergency. Follow these instructions at home:  Take medicines only as told by your doctor.  Do not use any tobacco products, including cigarettes, chewing tobacco, or electronic cigarettes. If you need help quitting, ask your doctor.  Lose weight if you are overweight, and maintain a healthy weight as told by your doctor.  Eat a diet that is low in fat and cholesterol. If you need help, ask your doctor.  Exercise regularly. Ask your doctor for some good activities for you.  Take good care of your feet. ? Wear comfortable shoes that fit well. ? Check your feet often for any cuts or sores. Contact a doctor if:  You have cramps in your legs while walking.  You have leg pain when you are at rest.  You have coldness in a leg or foot.  Your skin changes.  You are unable to get or have an erection (erectile dysfunction).  You have cuts or sores on your feet that are not healing. Get help right away if:  Your arm or leg turns cold and blue.  Your arms or legs become red, warm, swollen, painful, or numb.  You have chest pain or trouble breathing.  You suddenly have weakness in your face, arm, or leg.  You become very confused or you cannot speak.  You suddenly have a very bad headache.  You suddenly cannot see. This information is not intended to replace advice given to you by your health care provider. Make sure you discuss any questions you have with your health care provider. Document Released:  12/01/2009 Document Revised: 02/12/2016 Document Reviewed: 02/14/2014 Elsevier Interactive Patient Education  2017 Elsevier Inc.  

## 2018-03-27 NOTE — Progress Notes (Signed)
[facility] [place]   CC: Follow up peripheral artery occlusive disease  History of Present Illness [name] is a 72 y.o. female who is s/p stenting of her left superficial femoral artery in September of 2012 by Dr. Trula Slade. This alleviated her symptoms initially. 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   She has a known cerebral aneurysm followed by her neurologist, Dr. Hollice Gong is also following her carotid arteries, has had several TIA's.   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 MRA showed mod stenosis of the Lt posterior cerebral Aa. 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 Carotid dopplers &2D echocardiogram had no significant abnormalities. During hospitalization, BP's were mildly elevated and permissively monitored. Patient remained stable, improved and was d/c'd on 01/27/2017 for outpatient f/u. Patient was advised to continue ASA 325 mg daily & saw Dr Catalina Gravel on 01/18/2017 and he advised her to switch to Aggrenox and further advised that she f/u with a traditional Neurologist as advised at hospital discharge (Dr Lavera Guise). She has residual right arm numbness. She had some physical therapy.   She was hospitalized January 2016 at [facility] then [facility] with loss of balance, states she was told that she did not have a stroke, was told that she was dehydrated.  She was evaluated for abdominal pain in [facility] ED in November 2018, treated for lower UTI. She states the Cipro prescribed for this caused right facial swelling.   Her feet ache and feel cold all the time, she sees a podiatrist.   Her knees are painful when she  walks, more so right knee, this limits her walking before claudication does.   Dr. Trula Slade stopped the Pletal since it did not help her claudication symptoms.   She has a swallowing problem with nausea and abdominal pain "for a long time". She was referred to GI.  She states her insurance will not pay for [facility] which was advised by Dr. Dwyane Dee.   She states she has stage 3 CKD. 1.27 serum creatinine, GFR 49 on 01-03-18.   DM: A1C was 8.26 September 2014 (review of records) 9.3 in February 2017, 10.9 on 11-10-16, 8.3 on 01-03-18; states she has been tried on multiple DM medications. I referred her to Dr. Dwyane Dee at her 01-13-17 visit, but she is no longer seeing him.  Tobacco use: smoker (1/2 ppd x 40 yrs, is trying to quit)  Pt meds include:  Statin :Yes ASA: Yes Other anticoagulants/antiplatelets: allergic to Plavix    Past Medical History:  Diagnosis Date  . Abnormal findings on esophagogastroduodenoscopy (EGD) 07/2010  . Aneurysm [facility]) 2013   Right Brain   . Aneurysm ([place])    in brain x 2  . Anxiety   . Colon polyps   . Diabetes mellitus 1998  . Diverticulosis   . GERD (gastroesophageal reflux disease)   . Hemorrhoids   . Hiatal hernia   . Hyperlipidemia   . Hypertension 2000  . Migraines   . Peripheral vascular disease ([place])   . Phlebitis    30 years ago  left leg  . Status post colonoscopy 07/2010  . Stroke [facility])  multiple mini strokes ( brain aneurysm )    Social History Social History   Tobacco Use  . Smoking status: Current Every Day Smoker    Packs/day: 0.25    Years: 40.00    Pack years: 10.00    Types: Cigarettes  . Smokeless tobacco: Never Used  Substance Use Topics  . Alcohol use: No    Alcohol/week: 0.0 oz  . Drug use: No    Family History Family History  Problem Relation Age of Onset  . CAD Mother 37       Died of MI  . Hypertension Mother   . Heart attack Mother   . Heart disease Mother   . CAD Father 53       Died of MI  . Heart  disease Father   . Heart attack Father   . CAD Brother 84       Two brothers died of MI  . Heart attack Brother   . Heart disease Brother        Amputation  . Diabetes Sister   . Hypertension Sister   . Heart attack Brother   . Heart disease Brother   . Colon cancer Neg Hx   . Stomach cancer Neg Hx   . Esophageal cancer Neg Hx     Past Surgical History:  Procedure Laterality Date  . ABDOMINAL AORTAGRAM N/A 01/04/2012   Procedure: ABDOMINAL Maxcine Ham;  Surgeon: Serafina Mitchell, MD;  Location: [facility] C[facility] LAB;  Service: Cardiovascular;  Laterality: N/A;  . ABDOMINAL AORTAGRAM N/A 08/15/2012   Procedure: ABDOMINAL Maxcine Ham;  Surgeon: Serafina Mitchell, MD;  Location: [facility] CATH LAB;  Service: Cardiovascular;  Laterality: N/A;  . ABDOMINAL HYSTERECTOMY  1984  . cataract surgery Right 10-22-2015  . cataract surgery Left 11-05-2015  . CESAREAN SECTION    . CHOLECYSTECTOMY    . COLONOSCOPY  07/2010  . DENTAL SURGERY  Aug. 16, 2013   left lower   . ESOPHAGOGASTRODUODENOSCOPY  07/2010  . EYE SURGERY  Nov. 2014   Laser-Glaucoma  . FEMORAL ARTERY STENT  05/11/11   Left superficial femoral and popliteal artery  . FEMORAL-POPLITEAL BYPASS GRAFT  Nov. 26, 2013   Left Angiogram-   . HERNIA REPAIR     times two  . KNEE SURGERY    . LOWER EXTREMITY ANGIOGRAM Left 08/15/2012   Procedure: LOWER EXTREMITY ANGIOGRAM;  Surgeon: Serafina Mitchell, MD;  Location: [facility] CATH LAB;  Service: Cardiovascular;  Laterality: Left;  lt leg angio  . rotator cuff surgery    . THYROID SURGERY      Allergies  Allergen Reactions  . Ciprofloxacin Other (See Comments)  . Plavix [Clopidogrel Bisulfate] Palpitations  . Amoxicillin Itching and Swelling    FACE & EYES SWELL  . Ace Inhibitors   . Ciprofloxacin Hcl Swelling  . Lyrica [Pregabalin]     Itch, gain weight  . Penicillins Other (See Comments)    REACTION: unspecified    Current Outpatient Medications  Medication Sig Dispense Refill  . amLODipine  (NORVASC) 10 MG tablet Take 1 tablet (10 mg total) by mouth daily. 90 tablet 1  . aspirin EC 81 MG tablet Take 81 mg by mouth daily.    . Blood Glucose Monitoring Suppl (ACCU-CHEK AVIVA PLUS) W/DEVICE KIT Check blood sugar up to three times daily 1 kit 0  . CINNAMON PO Take by mouth.    . citalopram (CELEXA) 40 MG tablet TAKE 1 TABLET DAILY FOR MOOD & ANXIETY 90 tablet  1  . glucose blood (ACCU-CHEK AVIVA PLUS) test strip Use daily to check BS TID Dx. E11.22 300 each 1  . insulin NPH-regular Human (NOVOLIN 70/30) (70-30) 100 UNIT/ML injection INJECT 25 UNITS IN THE AM [place] WITH FOOD, AND INJECT 25 UNITS [place] WITH EVENING MEAL. 60 mL 1  . Insulin Syringe-Needle U-100 31G X 15/64" 0.5 ML MISC Use two pens daily with insulin 100 each 3  . Omega-3 Fatty Acids (FISH OIL) 1200 MG CAPS Take 1 capsule by mouth daily.    Marland Kitchen omeprazole (PRILOSEC) 40 MG capsule Take 1 capsule (40 mg total) by mouth daily. 90 capsule 2  . OVER THE COUNTER MEDICATION Take by mouth daily. One a day vitamin    . simvastatin (ZOCOR) 20 MG tablet Take 1 tablet at Bedtime for Cholesterol 90 tablet 2  . telmisartan (MICARDIS) 80 MG tablet TAKE 1 TABLET BY MOUTH EVERY DAY 90 tablet 0  . clonazePAM (KLONOPIN) 0.5 MG tablet Take 1/2 to 1 tablet 1 or 2 x/dily only if needed for severe anxiety 180 tablet 0   No current facility-administered medications for this visit.     ROS: See HPI for pertinent positives and negatives.   Physical Examination  Vitals:   03/27/18 0857 03/27/18 0901  BP: (!) 194/80 (!) 166/72  Pulse: 63 64  Resp: 16   Temp: (!) 97.5 F (36.4 C)   TempSrc: Oral   SpO2: 95%   Weight: 186 lb (84.4 kg)   Height: 5' 6"  (1.676 m)    Body mass index is 30.02 kg/m.  General: A&O x 3, WDWN obese female Gait: normal  Eyes: PERRLA  Pulmonary:Respirations are non labored,CTAB, without wheezes, rales,or rhonchi  Cardiac: regular rythm, no detected murmur   Carotid Bruits Right Left   positive positive    Abdominal aortic pulse isnotpalpable. Radial pulses:1+ palpable bilaterally  VASCULAR EXAM: Extremitieswithoutischemic changes, withoutGangrene; withoutopen wounds.  LE Pulses Right Left  FEMORAL 2+palpable 2+palpable   POPLITEAL notpalpable  notpalpable  POSTERIOR TIBIAL notpalpable  notpalpable   DORSALIS PEDIS ANTERIOR TIBIAL notpalpable  notpalpable     Abdomen: soft, non tender, no palpable masses.  Skin: no rashes, no ulcers.  Musculoskeletal: no muscle wasting or atrophy.  Neurologic: A&O X 3; Appropriate Affect ; SENSATION: normal; MOTOR FUNCTION: moving all extremities equally, motor strength 4/5 in UE's, 4/5 in LE's. Speech is slightly halting and rambling. CN 2-12 intact.  Psychiatric: Thought content is normal, mood appropriate for clinical situation.     ASSESSMENT: [name] is a 72 y.o. female who is s/p stenting of her left superficial femoral artery in September of 2012. This alleviated her symptoms initially. 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  She has a known cerebral aneurysm that followed by her neurologist, and who was also following her carotid arteries, has had several TIA's. However, Dr. 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 She has residual numbness in her right arm.   Her knees are painful which linits her walking; she does not seem to be able to walk enough to elicit claudication symptoms.  She has no signs of ischemia in her feet or legs. Femoral pulses are 2+ palpable bilaterally.  Her atherosclerotic risk factors include  uncontrolled but improvingDM and active smoking. She is no longer seeing an endocrinologist.  Bilateral carotid bruits, hx of last stroke in May 2018, will check carotid duplex on her return in 6 months; results from May 2018 under DATA.   Her DM is in better control, but she has gained weight from the increased insulin use, is now obese, and her knees are more painful, and she walks less due to this.    DATA  ABI (Date: 03/27/2018):  R:   ABI: 0.67 (was 0.61 on 09-26-17),   PT: mono  DP: mono  TBI:  0.41, toe pressure 79 (was 0.34)  L:   ABI: 0.58 (was 0.60),   PT: mono  DP: mono  TBI: 0.36, toe pressure 69 (was 0.38)  Stable bilateral ABI and TBI with moderate disease bilaterally, all monophasic waveforms.     Carotid Duplex (01-26-17): Findings consistent with a 1- 39 percent stenosis involving the right internal carotid artery and the left internal carotid artery. Left carotid evaluation could be underestimated due to moderate amount of calcified plaque. Bilateral vertebral arteries are patent and antegrade.     PLAN:  The patient was counseled re smoking cessation and given several free resources re smoking cessation. Daily seated leg exercises discussed and demonstrated.  /we also discussed her trying to obtain access to an indoor pool, and walking in the water; her knees would not be painful as she would be buoyed by the water.   Based on the patient's vascular studies and examination, pt will return to clinic in6 monthswith ABI's and carotid duplex, she knows to return sooner if needed.    I discussed in depth with the patient the nature of atherosclerosis, and emphasized the importance of maximal medical management including strict control of blood pressure, blood glucose, and lipid levels, obtaining regular exercise, and cessation of smoking.  The patient is aware that without maximal medical management the underlying atherosclerotic disease process  will progress, limiting the benefit of any interventions.  The patient was given information about PAD including signs, symptoms, treatment, what symptoms should prompt the patient to seek immediate medical care, and risk reduction measures to take.  Clemon Chambers, RN, MSN, FNP-C [facility] Phone: [phone]  Clinic MD: Trula Slade  03/27/18 9:18 AM

## 2018-03-30 ENCOUNTER — Other Ambulatory Visit: Payer: Self-pay

## 2018-03-30 DIAGNOSIS — Z959 Presence of cardiac and vascular implant and graft, unspecified: Secondary | ICD-10-CM

## 2018-03-30 DIAGNOSIS — I779 Disorder of arteries and arterioles, unspecified: Secondary | ICD-10-CM

## 2018-03-30 DIAGNOSIS — R0989 Other specified symptoms and signs involving the circulatory and respiratory systems: Secondary | ICD-10-CM

## 2018-04-05 ENCOUNTER — Other Ambulatory Visit: Payer: Self-pay | Admitting: Physician Assistant

## 2018-04-05 DIAGNOSIS — F411 Generalized anxiety disorder: Secondary | ICD-10-CM

## 2018-04-10 ENCOUNTER — Other Ambulatory Visit: Payer: Self-pay | Admitting: Internal Medicine

## 2018-04-10 DIAGNOSIS — F411 Generalized anxiety disorder: Secondary | ICD-10-CM

## 2018-04-10 MED ORDER — CLONAZEPAM 0.5 MG PO TABS: ORAL_TABLET | ORAL | 0 refills | Status: DC

## 2018-04-18 ENCOUNTER — Ambulatory Visit (INDEPENDENT_AMBULATORY_CARE_PROVIDER_SITE_OTHER): Payer: Medicare Other | Admitting: Internal Medicine

## 2018-04-18 ENCOUNTER — Encounter: Payer: Self-pay | Admitting: Internal Medicine

## 2018-04-18 VITALS — BP 136/82 | HR 72 | Temp 97.2°F | Resp 16 | Ht 66.0 in | Wt 187.4 lb

## 2018-04-18 DIAGNOSIS — F17200 Nicotine dependence, unspecified, uncomplicated: Secondary | ICD-10-CM

## 2018-04-18 DIAGNOSIS — Z136 Encounter for screening for cardiovascular disorders: Secondary | ICD-10-CM | POA: Diagnosis not present

## 2018-04-18 DIAGNOSIS — I700 Atherosclerosis of aorta: Secondary | ICD-10-CM | POA: Diagnosis not present

## 2018-04-18 DIAGNOSIS — E782 Mixed hyperlipidemia: Secondary | ICD-10-CM | POA: Diagnosis not present

## 2018-04-18 DIAGNOSIS — E1122 Type 2 diabetes mellitus with diabetic chronic kidney disease: Secondary | ICD-10-CM

## 2018-04-18 DIAGNOSIS — Z1211 Encounter for screening for malignant neoplasm of colon: Secondary | ICD-10-CM

## 2018-04-18 DIAGNOSIS — I10 Essential (primary) hypertension: Secondary | ICD-10-CM

## 2018-04-18 DIAGNOSIS — Z79899 Other long term (current) drug therapy: Secondary | ICD-10-CM | POA: Diagnosis not present

## 2018-04-18 DIAGNOSIS — Z8249 Family history of ischemic heart disease and other diseases of the circulatory system: Secondary | ICD-10-CM

## 2018-04-18 DIAGNOSIS — E559 Vitamin D deficiency, unspecified: Secondary | ICD-10-CM

## 2018-04-18 DIAGNOSIS — I779 Disorder of arteries and arterioles, unspecified: Secondary | ICD-10-CM | POA: Diagnosis not present

## 2018-04-18 DIAGNOSIS — K219 Gastro-esophageal reflux disease without esophagitis: Secondary | ICD-10-CM | POA: Diagnosis not present

## 2018-04-18 DIAGNOSIS — E1351 Other specified diabetes mellitus with diabetic peripheral angiopathy without gangrene: Secondary | ICD-10-CM

## 2018-04-18 DIAGNOSIS — N183 Chronic kidney disease, stage 3 (moderate): Secondary | ICD-10-CM

## 2018-04-18 DIAGNOSIS — Z794 Long term (current) use of insulin: Secondary | ICD-10-CM | POA: Diagnosis not present

## 2018-04-18 DIAGNOSIS — Z1212 Encounter for screening for malignant neoplasm of rectum: Secondary | ICD-10-CM

## 2018-04-18 NOTE — Progress Notes (Signed)
[place] [facility] [name], M.D.     Uvaldo Bristle. Silverio Lay, P.A.-C Liane Comber, [place] [address] [place], [place] [address] Telephone [phone] Telefax [phone]   Comprehensive Evaluation &  Examination     This very nice 72 y.o. MBF  presents for a  comprehensive evaluation and management of multiple medical co-morbidities.  Patient has been followed for HTN, HLD, T2_NIDDM  and Vitamin D Deficiency. Patient has GERD controlled on her meds.      HTN predates since 2000. Patient's BP has been controlled at home and patient denies any cardiac symptoms as chest pain, palpitations, shortness of breath, dizziness or ankle swelling. Today's BP was sl elevated & rechecked at goal - 136/82.      In May 2018, she was hospitalized with a small Lt Thalamic CVA . Patient has ASPVD and in 2012 she had a Lt Fem-Pop BPG by Dr Trula Slade.  Then in 2013, she had 2 angioplasties by Dr Trula Slade for in-stent stenosis. Tragically, she continues to smoke.      Patient's hyperlipidemia is not controlled with diet and medications. Patient denies myalgias or other medication SE's. Last lipids were not at goal: Lab Results  Component Value Date   CHOL 197 04/18/2018   HDL 49 (L) 04/18/2018   LDLCALC 125 (H) 04/18/2018   LDLDIRECT 146.4 01/30/2010   TRIG 124 04/18/2018   CHOLHDL 4.0 04/18/2018      Patient has hx/o Morbid Obesity (BMI 30+) and Insulin Requiring T2_NIDDM (1998) w/CKD3 (GFR 49) and patient denies reactive hypoglycemic symptoms, visual blurring, diabetic polys, or paresthesias. Patient has Compulsive Overeating and current  A1c is not at goal: Lab Results  Component Value Date   HGBA1C 8.7 (H) 04/18/2018      Finally, patient has history of Vitamin D Deficiency  ("31"/2012) and last Vitamin D is still very low & not at goal: Lab Results  Component Value Date   VD25OH 27 (L) 04/18/2018   Current Outpatient  Medications on File Prior to Visit  Medication Sig  . amLODipine (NORVASC) 10 MG tablet Take 1 tablet (10 mg total) by mouth daily.  Marland Kitchen aspirin EC 81 MG tablet Take 81 mg by mouth daily.  . Blood Glucose Monitoring Suppl (ACCU-CHEK AVIVA PLUS) W/DEVICE KIT Check blood sugar up to three times daily  . CINNAMON PO Take by mouth.  . citalopram (CELEXA) 40 MG tablet TAKE 1 TABLET DAILY FOR MOOD & ANXIETY  . clonazePAM (KLONOPIN) 0.5 MG tablet Take 1/2 to 1 tablet 1 to 2 x / day ONLY if needed for Anxiety Attack & please try to limit to 5 days /week to avoid addiction  . glucose blood (ACCU-CHEK AVIVA PLUS) test strip Use daily to check BS TID Dx. E11.22  . insulin NPH-regular Human (NOVOLIN 70/30) (70-30) 100 UNIT/ML injection INJECT 25 UNITS IN THE AM [place] WITH FOOD, AND INJECT 25 UNITS [place] WITH EVENING MEAL.  Marland Kitchen Insulin Syringe-Needle U-100 31G X 15/64" 0.5 ML MISC Use two pens daily with insulin  . Omega-3 Fatty Acids (FISH OIL) 1200 MG CAPS Take 1 capsule by mouth daily.  Marland Kitchen omeprazole (PRILOSEC) 40 MG capsule Take 1 capsule (40 mg total) by mouth daily.  Marland Kitchen OVER THE COUNTER MEDICATION Take by mouth daily. One a day vitamin  . simvastatin (ZOCOR) 20 MG tablet Take 1 tablet at Bedtime for Cholesterol  . telmisartan (MICARDIS) 80 MG tablet TAKE 1 TABLET BY MOUTH EVERY DAY  No current facility-administered medications on file prior to visit.    Allergies  Allergen Reactions  . Ciprofloxacin Other (See Comments)  . Plavix [Clopidogrel Bisulfate] Palpitations  . Amoxicillin Itching and Swelling    FACE & EYES SWELL  . Ace Inhibitors   . Ciprofloxacin Hcl Swelling  . Lyrica [Pregabalin]     Itch, gain weight  . Penicillins Other (See Comments)    REACTION: unspecified   Past Medical History:  Diagnosis Date  . Abnormal findings on esophagogastroduodenoscopy (EGD) 07/2010  . Aneurysm [facility]) 2013   Right Brain   . Aneurysm ([place])    in brain x 2  . Anxiety   . Colon polyps   . Diabetes  mellitus 1998  . Diverticulosis   . GERD (gastroesophageal reflux disease)   . Hemorrhoids   . Hiatal hernia   . Hyperlipidemia   . Hypertension 2000  . Migraines   . Peripheral vascular disease ([place])   . Phlebitis    30 years ago  left leg  . Status post colonoscopy 07/2010  . Stroke [facility])    multiple mini strokes ( brain aneurysm )   Health Maintenance  Topic Date Due  . INFLUENZA VACCINE  04/20/2018  . HEMOGLOBIN A1C  10/19/2018  . OPHTHALMOLOGY EXAM  10/31/2018  . FOOT EXAM  04/19/2019  . COLONOSCOPY  09/21/2019  . MAMMOGRAM  10/06/2019  . TETANUS/TDAP  03/25/2025  . DEXA SCAN  Completed  . Hepatitis C Screening  Completed  . PNA vac Low Risk Adult  Completed   Immunization History  Administered Date(s) Administered  . DT 03/26/2015  . Influenza Whole 08/02/2007, 06/18/2009, 07/09/2013  . Influenza, High Dose Seasonal PF 08/05/2015, 07/22/2016, 07/04/2017  . Pneumococcal Conjugate-13 07/22/2016  . Pneumococcal Polysaccharide-23 02/08/2014  . Td 09/21/2004   Last Colon - 07/31/2010 - Dr Henrene Pastor recc 10 yr f/u - due Nov 2021 Last MGM - 1/16/209  Past Surgical History:  Procedure Laterality Date  . ABDOMINAL AORTAGRAM N/A 01/04/2012   Procedure: ABDOMINAL Maxcine Ham;  Surgeon: Serafina Mitchell, MD;  Location: [facility] CATH LAB;  Service: Cardiovascular;  Laterality: N/A;  . ABDOMINAL AORTAGRAM N/A 08/15/2012   Procedure: ABDOMINAL Maxcine Ham;  Surgeon: Serafina Mitchell, MD;  Location: [facility] CATH LAB;  Service: Cardiovascular;  Laterality: N/A;  . ABDOMINAL HYSTERECTOMY  1984  . cataract surgery Right 10-22-2015  . cataract surgery Left 11-05-2015  . CESAREAN SECTION    . CHOLECYSTECTOMY    . COLONOSCOPY  07/2010  . DENTAL SURGERY  Aug. 16, 2013   left lower   . ESOPHAGOGASTRODUODENOSCOPY  07/2010  . EYE SURGERY  Nov. 2014   Laser-Glaucoma  . FEMORAL ARTERY STENT  05/11/11   Left superficial femoral and popliteal artery  . FEMORAL-POPLITEAL BYPASS GRAFT  Nov. 26, 2013    Left Angiogram-   . HERNIA REPAIR     times two  . KNEE SURGERY    . LOWER EXTREMITY ANGIOGRAM Left 08/15/2012   Procedure: LOWER EXTREMITY ANGIOGRAM;  Surgeon: Serafina Mitchell, MD;  Location: [facility] CATH LAB;  Service: Cardiovascular;  Laterality: Left;  lt leg angio  . rotator cuff surgery    . THYROID SURGERY     Family History  Problem Relation Age of Onset  . CAD Mother 21       Died of MI  . Hypertension Mother   . Heart attack Mother   . Heart disease Mother   . CAD Father 57  Died of MI  . Heart disease Father   . Heart attack Father   . CAD Brother 54       Two brothers died of MI  . Heart attack Brother   . Heart disease Brother        Amputation  . Diabetes Sister   . Hypertension Sister   . Heart attack Brother   . Heart disease Brother   . Colon cancer Neg Hx   . Stomach cancer Neg Hx   . Esophageal cancer Neg Hx    Social History   Tobacco Use  . Smoking status: Current Every Day Smoker    Packs/day: 0.25    Years: 40.00    Pack years: 10.00    Types: Cigarettes  . Smokeless tobacco: Never Used  Substance Use Topics  . Alcohol use: No    Alcohol/week: 0.0 oz  . Drug use: No    ROS Constitutional: Denies fever, chills, weight loss/gain, headaches, insomnia,  night sweats, and change in appetite. Does c/o fatigue. Eyes: Denies redness, blurred vision, diplopia, discharge, itchy, watery eyes.  ENT: Denies discharge, congestion, post nasal drip, epistaxis, sore throat, earache, hearing loss, dental pain, Tinnitus, Vertigo, Sinus pain, snoring.  Cardio: Denies chest pain, palpitations, irregular heartbeat, syncope, dyspnea, diaphoresis, orthopnea, PND, claudication, edema Respiratory: denies cough, dyspnea, DOE, pleurisy, hoarseness, laryngitis, wheezing.  Gastrointestinal: Denies dysphagia, heartburn, reflux, water brash, pain, cramps, nausea, vomiting, bloating, diarrhea, constipation, hematemesis, melena, hematochezia, jaundice,  hemorrhoids Genitourinary: Denies dysuria, frequency, urgency, nocturia, hesitancy, discharge, hematuria, flank pain Breast: Breast lumps, nipple discharge, bleeding.  Musculoskeletal: Denies arthralgia, myalgia, stiffness, Jt. Swelling, pain, limp, and strain/sprain. Denies falls. Skin: Denies puritis, rash, hives, warts, acne, eczema, changing in skin lesion Neuro: No weakness, tremor, incoordination, spasms, paresthesia, pain Psychiatric: Denies confusion, memory loss, sensory loss. Denies Depression. Endocrine: Denies change in weight, skin, hair change, nocturia, and paresthesia, diabetic polys, visual blurring, hyper / hypo glycemic episodes.  Heme/Lymph: No excessive bleeding, bruising, enlarged lymph nodes.  Physical Exam  BP 136/82   Pulse 72   Temp (!) 97.2 F (36.2 C)   Resp 16   Ht '5\' 6"'$  (1.676 m)   Wt 187 lb 6.4 oz (85 kg)   BMI 30.25 kg/m   General Appearance: Over nourished, well groomed and in no apparent distress.  Eyes: PERRLA, EOMs, conjunctiva no swelling or erythema, normal fundi and vessels. Sinuses: No frontal/maxillary tenderness ENT/Mouth: EACs patent / TMs  nl. Nares clear without erythema, swelling, mucoid exudates. Oral hygiene is good. No erythema, swelling, or exudate. Tongue normal, non-obstructing. Tonsils not swollen or erythematous. Hearing normal.  Neck: Supple, thyroid not palpable. No bruits, nodes or JVD. Respiratory: Respiratory effort normal.  BS equal and clear bilateral without rales, rhonci, wheezing or stridor. Cardio: Heart sounds are normal with regular rate and rhythm and no murmurs, rubs or gallops. Pedal pulses are not palpable. No aortic or femoral bruits. Chest: symmetric with normal excursions and percussion. Breasts: Symmetric, without lumps, nipple discharge, retractions, or fibrocystic changes.  Abdomen: Flat, soft with bowel sounds active. Nontender, no guarding, rebound, hernias, masses, or organomegaly.  Lymphatics: Non tender  without lymphadenopathy.  Musculoskeletal: Full ROM all peripheral extremities, joint stability, 5/5 strength, and normal gait. Skin: Warm and dry without rashes, lesions, cyanosis, clubbing or  ecchymosis.  Neuro: Cranial nerves intact, reflexes equal bilaterally. Normal muscle tone, no cerebellar symptoms.  Sensation intact to touch, vibratory and Monofilament to the toes bilaterally.  Pysch: Alert and oriented X  3, normal affect, Insight and Judgment appropriate.   Assessment and Plan  1. Essential hypertension  - EKG 12-Lead - Urinalysis, Routine w reflex microscopic - Microalbumin / creatinine urine ratio - CBC with Differential/Platelet - COMPLETE METABOLIC PANEL WITH GFR - Magnesium - TSH  2. Hyperlipidemia, mixed  - EKG 12-Lead - Lipid panel - TSH  3. Type 2 diabetes mellitus with stage 3 chronic kidney disease, with long-term current use of insulin (HCC)  - EKG 12-Lead - Urinalysis, Routine w reflex microscopic - Microalbumin / creatinine urine ratio - HM DIABETES FOOT EXAM - LOW EXTREMITY NEUR EXAM DOCUM - Hemoglobin A1c  4. Vitamin D deficiency  - VITAMIN D 25 Hydroxyl  5. Peripheral vascular disease due to secondary diabetes mellitus ([place])  - Lipid panel  6. Gastroesophageal reflux disease  - CBC with Differential/Platelet  7. Screening for ischemic heart disease  - EKG 12-Lead  8. Smoker  - EKG 12-Lead  9. Atherosclerosis of aorta (HCC) - EKG 12-Lead  10. FHx: heart disease  - EKG 12-Lead  11. Medication management  - Urinalysis, Routine w reflex microscopic - Microalbumin / creatinine urine ratio - Uric acid - CBC with Differential/Platelet - COMPLETE METABOLIC PANEL WITH GFR - Magnesium - Lipid panel - TSH - Hemoglobin A1c - VITAMIN D 25 Hydroxyl  12. Screening for colorectal cancer  - POC Hemoccult Bld/Stl       Patient was counseled in prudent diet to achieve/maintain BMI less than 25 for weight control, BP monitoring,  regular exercise and medications. Discussed med's effects and SE's. Screening labs and tests as requested with regular follow-up as recommended. Over 40 minutes of exam, counseling, chart review and hi complex critical decision making was performed.

## 2018-04-18 NOTE — Patient Instructions (Signed)

## 2018-04-19 ENCOUNTER — Other Ambulatory Visit: Payer: Self-pay | Admitting: Internal Medicine

## 2018-04-19 LAB — COMPLETE METABOLIC PANEL WITH GFR
AG Ratio: 1.3 (calc) (ref 1.0–2.5)
ALKALINE PHOSPHATASE (APISO): 103 U/L (ref 33–130)
ALT: 14 U/L (ref 6–29)
AST: 22 U/L (ref 10–35)
Albumin: 3.8 g/dL (ref 3.6–5.1)
BUN/Creatinine Ratio: 15 (calc) (ref 6–22)
BUN: 19 mg/dL (ref 7–25)
CALCIUM: 9.7 mg/dL (ref 8.6–10.4)
CO2: 31 mmol/L (ref 20–32)
CREATININE: 1.29 mg/dL — AB (ref 0.60–0.93)
Chloride: 102 mmol/L (ref 98–110)
GFR, EST NON AFRICAN AMERICAN: 42 mL/min/{1.73_m2} — AB (ref >=60)
GFR, Est African American: 48 mL/min/{1.73_m2} — ABNORMAL LOW (ref >=60)
GLOBULIN: 3 g/dL (ref 1.9–3.7)
Glucose, Bld: 255 mg/dL — ABNORMAL HIGH (ref 65–99)
Potassium: 4.4 mmol/L (ref 3.5–5.3)
SODIUM: 142 mmol/L (ref 135–146)
Total Bilirubin: 0.7 mg/dL (ref 0.2–1.2)
Total Protein: 6.8 g/dL (ref 6.1–8.1)

## 2018-04-19 LAB — URIC ACID: URIC ACID, SERUM: 4.6 mg/dL (ref 2.5–7.0)

## 2018-04-19 LAB — URINALYSIS, ROUTINE W REFLEX MICROSCOPIC
BILIRUBIN URINE: NEGATIVE
Bacteria, UA: NONE SEEN /HPF
HGB URINE DIPSTICK: NEGATIVE
HYALINE CAST: NONE SEEN /LPF
Ketones, ur: NEGATIVE
Leukocytes, UA: NEGATIVE
NITRITE: NEGATIVE
RBC / HPF: NONE SEEN /HPF (ref 0–2)
Specific Gravity, Urine: 1.019 (ref 1.001–1.03)
WBC, UA: NONE SEEN /HPF (ref 0–5)
pH: 5.5 (ref 5.0–8.0)

## 2018-04-19 LAB — CBC WITH DIFFERENTIAL/PLATELET
BASOS ABS: 68 {cells}/uL (ref 0–200)
Basophils Relative: 0.9 %
EOS PCT: 1.7 %
Eosinophils Absolute: 129 cells/uL (ref 15–500)
HEMATOCRIT: 45.3 % — AB (ref 35.0–45.0)
HEMOGLOBIN: 15.3 g/dL (ref 11.7–15.5)
LYMPHS ABS: 2341 {cells}/uL (ref 850–3900)
MCH: 32.7 pg (ref 27.0–33.0)
MCHC: 33.8 g/dL (ref 32.0–36.0)
MCV: 96.8 fL (ref 80.0–100.0)
MPV: 11 fL (ref 7.5–12.5)
Monocytes Relative: 5.8 %
NEUTROS ABS: 4621 {cells}/uL (ref 1500–7800)
Neutrophils Relative %: 60.8 %
Platelets: 268 10*3/uL (ref 140–400)
RBC: 4.68 10*6/uL (ref 3.80–5.10)
RDW: 11.4 % (ref 11.0–15.0)
Total Lymphocyte: 30.8 %
WBC mixed population: 441 cells/uL (ref 200–950)
WBC: 7.6 10*3/uL (ref 3.8–10.8)

## 2018-04-19 LAB — LIPID PANEL
CHOL/HDL RATIO: 4 (calc) (ref <5.0)
CHOLESTEROL: 197 mg/dL (ref <200)
HDL: 49 mg/dL — AB (ref >50)
LDL CHOLESTEROL (CALC): 125 mg/dL — AB
Non-HDL Cholesterol (Calc): 148 mg/dL (calc) — ABNORMAL HIGH (ref <130)
TRIGLYCERIDES: 124 mg/dL (ref <150)

## 2018-04-19 LAB — MICROALBUMIN / CREATININE URINE RATIO
CREATININE, URINE: 99 mg/dL (ref 20–275)
Microalb Creat Ratio: 3914 mcg/mg creat — ABNORMAL HIGH (ref <30)
Microalb, Ur: 387.5 mg/dL

## 2018-04-19 LAB — HEMOGLOBIN A1C
Hgb A1c MFr Bld: 8.7 % of total Hgb — ABNORMAL HIGH (ref <5.7)
MEAN PLASMA GLUCOSE: 203 (calc)
eAG (mmol/L): 11.2 (calc)

## 2018-04-19 LAB — MAGNESIUM: MAGNESIUM: 1.7 mg/dL (ref 1.5–2.5)

## 2018-04-19 LAB — TSH: TSH: 1.34 mIU/L (ref 0.40–4.50)

## 2018-04-19 LAB — VITAMIN D 25 HYDROXY (VIT D DEFICIENCY, FRACTURES): VIT D 25 HYDROXY: 27 ng/mL — AB (ref 30–100)

## 2018-04-19 MED ORDER — ROSUVASTATIN CALCIUM 40 MG PO TABS: ORAL_TABLET | ORAL | 1 refills | Status: DC

## 2018-04-22 ENCOUNTER — Encounter: Payer: Self-pay | Admitting: Internal Medicine

## 2018-04-26 ENCOUNTER — Ambulatory Visit: Payer: Medicare Other | Admitting: Podiatry

## 2018-05-14 ENCOUNTER — Other Ambulatory Visit: Payer: Self-pay | Admitting: Adult Health

## 2018-05-15 ENCOUNTER — Other Ambulatory Visit: Payer: Self-pay | Admitting: Internal Medicine

## 2018-05-17 ENCOUNTER — Other Ambulatory Visit: Payer: Self-pay

## 2018-05-17 MED ORDER — INSULIN NPH ISOPHANE & REGULAR (70-30) 100 UNIT/ML ~~LOC~~ SUSP: SUBCUTANEOUS | 1 refills | Status: DC

## 2018-05-27 ENCOUNTER — Other Ambulatory Visit: Payer: Self-pay | Admitting: Adult Health

## 2018-05-27 DIAGNOSIS — I10 Essential (primary) hypertension: Secondary | ICD-10-CM

## 2018-06-05 ENCOUNTER — Other Ambulatory Visit: Payer: Self-pay | Admitting: Internal Medicine

## 2018-06-05 DIAGNOSIS — F411 Generalized anxiety disorder: Secondary | ICD-10-CM

## 2018-06-06 ENCOUNTER — Other Ambulatory Visit: Payer: Self-pay

## 2018-06-06 ENCOUNTER — Emergency Department (HOSPITAL_COMMUNITY): Payer: Medicare Other

## 2018-06-06 ENCOUNTER — Telehealth: Payer: Self-pay | Admitting: Neurology

## 2018-06-06 ENCOUNTER — Encounter (HOSPITAL_COMMUNITY): Payer: Self-pay | Admitting: Emergency Medicine

## 2018-06-06 ENCOUNTER — Emergency Department (HOSPITAL_COMMUNITY)
Admission: EM | Admit: 2018-06-06 | Discharge: 2018-06-06 | Disposition: A | Discharge status: Home / Self Care | Payer: Medicare Other | Attending: Emergency Medicine | Admitting: Emergency Medicine

## 2018-06-06 DIAGNOSIS — Z8673 Personal history of transient ischemic attack (TIA), and cerebral infarction without residual deficits: Secondary | ICD-10-CM | POA: Insufficient documentation

## 2018-06-06 DIAGNOSIS — N183 Chronic kidney disease, stage 3 (moderate): Secondary | ICD-10-CM | POA: Insufficient documentation

## 2018-06-06 DIAGNOSIS — Z79899 Other long term (current) drug therapy: Secondary | ICD-10-CM | POA: Diagnosis not present

## 2018-06-06 DIAGNOSIS — Z794 Long term (current) use of insulin: Secondary | ICD-10-CM | POA: Insufficient documentation

## 2018-06-06 DIAGNOSIS — I639 Cerebral infarction, unspecified: Secondary | ICD-10-CM | POA: Diagnosis not present

## 2018-06-06 DIAGNOSIS — E119 Type 2 diabetes mellitus without complications: Secondary | ICD-10-CM | POA: Diagnosis not present

## 2018-06-06 DIAGNOSIS — I129 Hypertensive chronic kidney disease with stage 1 through stage 4 chronic kidney disease, or unspecified chronic kidney disease: Secondary | ICD-10-CM | POA: Insufficient documentation

## 2018-06-06 DIAGNOSIS — I10 Essential (primary) hypertension: Secondary | ICD-10-CM

## 2018-06-06 DIAGNOSIS — E1122 Type 2 diabetes mellitus with diabetic chronic kidney disease: Secondary | ICD-10-CM | POA: Insufficient documentation

## 2018-06-06 DIAGNOSIS — R519 Headache, unspecified: Secondary | ICD-10-CM

## 2018-06-06 DIAGNOSIS — Z1212 Encounter for screening for malignant neoplasm of rectum: Secondary | ICD-10-CM | POA: Diagnosis not present

## 2018-06-06 DIAGNOSIS — Z1211 Encounter for screening for malignant neoplasm of colon: Secondary | ICD-10-CM

## 2018-06-06 DIAGNOSIS — R51 Headache: Secondary | ICD-10-CM | POA: Insufficient documentation

## 2018-06-06 DIAGNOSIS — Z7982 Long term (current) use of aspirin: Secondary | ICD-10-CM | POA: Insufficient documentation

## 2018-06-06 DIAGNOSIS — R079 Chest pain, unspecified: Secondary | ICD-10-CM | POA: Diagnosis not present

## 2018-06-06 DIAGNOSIS — G8929 Other chronic pain: Secondary | ICD-10-CM

## 2018-06-06 LAB — BASIC METABOLIC PANEL
Anion gap: 12 (ref 5–15)
BUN: 16 mg/dL (ref 8–23)
CALCIUM: 8.9 mg/dL (ref 8.9–10.3)
CHLORIDE: 101 mmol/L (ref 98–111)
CO2: 25 mmol/L (ref 22–32)
CREATININE: 1.41 mg/dL — AB (ref 0.44–1.00)
GFR calc Af Amer: 42 mL/min — ABNORMAL LOW (ref >60)
GFR, EST NON AFRICAN AMERICAN: 37 mL/min — AB (ref >60)
Glucose, Bld: 366 mg/dL — ABNORMAL HIGH (ref 70–99)
Potassium: 4.1 mmol/L (ref 3.5–5.1)
SODIUM: 138 mmol/L (ref 135–145)

## 2018-06-06 LAB — CBC WITH DIFFERENTIAL/PLATELET
Abs Immature Granulocytes: 0 10*3/uL (ref 0.0–0.1)
BASOS ABS: 0.1 10*3/uL (ref 0.0–0.1)
BASOS PCT: 1 %
EOS ABS: 0.1 10*3/uL (ref 0.0–0.7)
EOS PCT: 1 %
HEMATOCRIT: 47 % — AB (ref 36.0–46.0)
Hemoglobin: 15.3 g/dL — ABNORMAL HIGH (ref 12.0–15.0)
IMMATURE GRANULOCYTES: 0 %
Lymphocytes Relative: 24 %
Lymphs Abs: 2.5 10*3/uL (ref 0.7–4.0)
MCH: 32.4 pg (ref 26.0–34.0)
MCHC: 32.6 g/dL (ref 30.0–36.0)
MCV: 99.6 fL (ref 78.0–100.0)
Monocytes Absolute: 0.7 10*3/uL (ref 0.1–1.0)
Monocytes Relative: 7 %
NEUTROS PCT: 67 %
Neutro Abs: 7.1 10*3/uL (ref 1.7–7.7)
PLATELETS: 256 10*3/uL (ref 150–400)
RBC: 4.72 MIL/uL (ref 3.87–5.11)
RDW: 11.1 % — AB (ref 11.5–15.5)
WBC: 10.6 10*3/uL — AB (ref 4.0–10.5)

## 2018-06-06 LAB — POC HEMOCCULT BLD/STL (HOME/3-CARD/SCREEN)
FECAL OCCULT BLD: NEGATIVE
FECAL OCCULT BLD: NEGATIVE
Fecal Occult Blood, POC: NEGATIVE

## 2018-06-06 LAB — I-STAT TROPONIN, ED: Troponin i, poc: 0.01 ng/mL (ref 0.00–0.08)

## 2018-06-06 LAB — SEDIMENTATION RATE: Sed Rate: 11 mm/hr (ref 0–22)

## 2018-06-06 MED ORDER — PROCHLORPERAZINE EDISYLATE 10 MG/2ML IJ SOLN: 5.0000 mg | Freq: Once | INTRAMUSCULAR | Status: DC

## 2018-06-06 MED ORDER — IRBESARTAN 300 MG PO TABS
300.0000 mg | ORAL_TABLET | Freq: Every day | ORAL | Status: DC
  Administered 2018-06-06: 300 mg via ORAL
  Filled 2018-06-06: qty 1

## 2018-06-06 MED ORDER — AMLODIPINE BESYLATE 5 MG PO TABS
10.0000 mg | ORAL_TABLET | Freq: Every day | ORAL | Status: DC
  Administered 2018-06-06: 10 mg via ORAL
  Filled 2018-06-06: qty 2

## 2018-06-06 MED ORDER — TELMISARTAN 80 MG PO TABS: 80.0000 mg | ORAL_TABLET | Freq: Every day | ORAL | 0 refills | Status: DC

## 2018-06-06 MED ORDER — OXYCODONE-ACETAMINOPHEN 5-325 MG PO TABS
1.0000 | ORAL_TABLET | Freq: Once | ORAL | Status: DC
  Filled 2018-06-06: qty 1

## 2018-06-06 NOTE — ED Provider Notes (Signed)
MSE was initiated and I personally evaluated the patient and placed orders (if any) at  1:34 PM on June 06, 2018.  The patient appears stable so that the remainder of the MSE may be completed by another provider.  Patient placed in Quick Look pathway, seen and evaluated   Chief Complaint: headache, chest discomfort  HPI:   72 year old female with a past medical history of prior CVA, PVD, who presents to ED for 3 day history of left sided headache. Pain will go to her L jaw and neck as well. Describes the pain as throbbing and not similar to her prior migraines. No improvement with ibuprofen. Symptoms began gradually. She also reports discomfort in her chest described as "feeling like my heart is pounding, but feels like my heart wants to stop beating." Denies any specific pain. States this happens intermittently without any aggravating or alleviating factors. Denies hx of MI, PE. Reports difficulty walking at baseline 2/2 her PVD, no changes to gait, denies numbness or weakness.  ROS: headache  Physical Exam:   Gen: No distress  Neuro: Awake and Alert  Skin: Warm    Focused Exam: PERRL. No facial asymmetry noted. Strength 5/5 in bilateral UE and LE. No pronator drift noted. RRR. Lungs CTAB. AAOx3.   Initiation of care has begun. The patient has been counseled on the process, plan, and necessity for staying for the completion/evaluation, and the remainder of the medical screening examination    Delia Heady, PA-C 06/06/18 [place], MD 06/06/18 1616

## 2018-06-06 NOTE — Discharge Instructions (Addendum)
Recheck blood pressure and blood sugar in morning Refill medication Return if you are worse at any time.

## 2018-06-06 NOTE — ED Triage Notes (Signed)
Pt presents to ED for assessment of left sided headache x 3 days, left eye redness, jaw/dental/facial left sided pain, a choking sensation in her throat, especially with swallowing, heart pounding sensation, generalized weakness.  APP at bedside

## 2018-06-06 NOTE — ED Notes (Signed)
Pt declined pain med and Avapro ordered.  She states she does not take Avapro and takes something else

## 2018-06-06 NOTE — Telephone Encounter (Signed)
Spoke with [name] and she stated that her head is still hurting but the spinning of the room has subsided. She also stated that she has some tightness on left side and her gums also feel numb. I advised her that [name] doesn't have any appointments available for today and that she should go to the ED to be evaluated. Patient agreed to go to the ED. She stated that she would call and give [place] an update once she's home from the ED.

## 2018-06-06 NOTE — ED Provider Notes (Addendum)
[facility] EMERGENCY DEPARTMENT Provider Note   CSN: [id number] Arrival date & time: 06/06/18  1315     History   Chief Complaint Chief Complaint  Patient presents with  . Chest Pain  . Headache    HPI [name] is a 72 y.o. female.  HPI 72 year old female presents today complaining of headache x3 to 4 days.  She states it was present several days ago on awakening behind her left eye.  It is dull and pressure-like.  She has a history of migraines but normally gets scotoma and a severe headache.  She states this is not the same type of pain is her usual migraines.  She reports no trauma, fever, neck pain, or lateralized deficits.  Patient seen and evaluated initially by provider in triage.  CBC obtained, glucose elevated at 366, sed rate 11.  CT of head Past Medical History:  Diagnosis Date  . Abnormal findings on esophagogastroduodenoscopy (EGD) 07/2010  . Aneurysm [facility]) 2013   Right Brain   . Aneurysm ([place])    in brain x 2  . Anxiety   . Colon polyps   . Diabetes mellitus 1998  . Diverticulosis   . GERD (gastroesophageal reflux disease)   . Hemorrhoids   . Hiatal hernia   . Hyperlipidemia   . Hypertension 2000  . Migraines   . Peripheral vascular disease ([place])   . Phlebitis    30 years ago  left leg  . Status post colonoscopy 07/2010  . Stroke [facility])    multiple mini strokes ( brain aneurysm )    Patient Active Problem List   Diagnosis Date Noted  . Emphysema of lung ([place]) 10/10/2017  . Atherosclerosis of aorta ([place]) 10/10/2017  . Lung nodule 10/10/2017  . PVD (peripheral vascular disease) ([place]) 04/13/2017  . Cerebrovascular accident (CVA) due to thrombosis of left posterior cerebral artery ([place]) 01/25/2017  . Hyperlipidemia, mixed 11/07/2015  . Insulin-requiring or dependent type II diabetes mellitus ([place]) 11/07/2015  . Encounter for Medicare annual wellness exam 06/24/2015  . Generalized anxiety disorder 03/26/2015  . Depression,  major, recurrent, in partial remission ([place]) 03/26/2015  . History of CVA (cerebrovascular accident) 10/04/2014  . Tobacco abuse 10/04/2014  . Vitamin D deficiency 11/02/2013  . Medication management 11/02/2013  . Peripheral vascular disease due to secondary diabetes mellitus ([place]) 05/08/2012  . Atherosclerosis of native arteries of extremity with intermittent claudication 12/24/2011  . Diverticulosis of large intestine 06/09/2010  . History of colonic polyps 06/09/2010  . ESOPHAGEAL STRICTURE 08/27/2008  . Esophageal reflux 08/27/2008  . Nontoxic multinodular goiter 03/11/2008  . Glaucoma 03/11/2008  . Brain aneurysm 03/11/2008  . CKD stage 3 due to type 2 diabetes mellitus ([place]) 05/01/2007  . Essential hypertension 05/01/2007    Past Surgical History:  Procedure Laterality Date  . ABDOMINAL AORTAGRAM N/A 01/04/2012   Procedure: ABDOMINAL Maxcine Ham;  Surgeon: Serafina Mitchell, MD;  Location[facility] C[facility] LAB;  Service: Cardiovascular;  Laterality: N/A;  . ABDOMINAL AORTAGRAM N/A 08/15/2012   Procedure: ABDOMINAL Maxcine Ham;  Surgeon: Serafina Mitchell, MD;  Location: [facility] CATH LAB;  Service: Cardiovascular;  Laterality: N/A;  . ABDOMINAL HYSTERECTOMY  1984  . cataract surgery Right 10-22-2015  . cataract surgery Left 11-05-2015  . CESAREAN SECTION    . CHOLECYSTECTOMY    . COLONOSCOPY  07/2010  . DENTAL SURGERY  Aug. 16, 2013   left lower   . ESOPHAGOGASTRODUODENOSCOPY  07/2010  . EYE SURGERY  Nov. 2014  Laser-Glaucoma  . FEMORAL ARTERY STENT  05/11/11   Left superficial femoral and popliteal artery  . FEMORAL-POPLITEAL BYPASS GRAFT  Nov. 26, 2013   Left Angiogram-   . HERNIA REPAIR     times two  . KNEE SURGERY    . LOWER EXTREMITY ANGIOGRAM Left 08/15/2012   Procedure: LOWER EXTREMITY ANGIOGRAM;  Surgeon: Serafina Mitchell, MD;  Location: [facility] CATH LAB;  Service: Cardiovascular;  Laterality: Left;  lt leg angio  . rotator cuff surgery    . THYROID SURGERY       OB History    None      Home Medications    Prior to Admission medications   Medication Sig Start Date End Date Taking? Authorizing Provider  amLODipine (NORVASC) 10 MG tablet TAKE 1 TABLET BY MOUTH EVERY DAY Patient taking differently: Take 10 mg by mouth daily.  05/14/18  Yes [name], MD  aspirin EC 81 MG tablet Take 81 mg by mouth at bedtime.    Yes [provider]  Cholecalciferol (VITAMIN D PO) Take 5,000 Units by mouth daily.   Yes [provider]  CINNAMON PO Take 1,000 mg by mouth 2 (two) times daily.    Yes [provider]  citalopram (CELEXA) 40 MG tablet TAKE 1 TABLET DAILY FOR MOOD & ANXIETY Patient taking differently: Take 20 mg by mouth daily.  09/27/17  Yes [name], PA-C  clonazePAM (KLONOPIN) 0.5 MG tablet PLEASE SEE ATTACHED FOR DETAILED DIRECTIONS Patient taking differently: Take 0.5 mg by mouth at bedtime. PLEASE SEE ATTACHED FOR DETAILED DIRECTIONS 06/05/18  Yes [name], PA-C  insulin NPH-regular Human (NOVOLIN 70/30) (70-30) 100 UNIT/ML injection INJECT 25 UNITS IN THE AM [place] WITH FOOD, AND INJECT 25 UNITS [place] WITH EVENING MEAL. Patient taking differently: Inject 20-25 Units into the skin See admin instructions. INJECT 25 UNITS IN THE AM [place] WITH FOOD, AND INJECT 20 UNITS [place] WITH EVENING MEAL. 05/17/18  Yes [name], MD  Magnesium 500 MG TABS Take 500 mg by mouth daily.   Yes [provider]  Omega-3 Fatty Acids (FISH OIL) 1200 MG CAPS Take 1,200 capsules by mouth daily.    Yes [provider]  omeprazole (PRILOSEC) 40 MG capsule Take 1 capsule (40 mg total) by mouth daily. 09/27/17  Yes [name], PA-C  OVER THE COUNTER MEDICATION Take 1 tablet by mouth daily. One a day vitamin Woman's one a day   Yes [provider]  rosuvastatin (CRESTOR) 40 MG tablet Take 1/2 to 1 tablet daily or as directed for Cholesterol Patient taking differently: Take 20 mg by mouth at bedtime. Take 1/2 to 1 tablet daily or  as directed for Cholesterol 04/19/18  Yes [name], MD  telmisartan (MICARDIS) 80 MG tablet TAKE 1 TABLET BY MOUTH EVERY DAY Patient taking differently: Take 80 mg by mouth daily.  05/27/18  Yes [name], MD  vitamin E 400 UNIT capsule Take 400 Units by mouth daily.   Yes [provider]  Blood Glucose Monitoring Suppl (ACCU-CHEK AVIVA PLUS) W/DEVICE KIT Check blood sugar up to three times daily 03/26/15   [name], PA-C  glucose blood (ACCU-CHEK AVIVA PLUS) test strip Use daily to check BS TID Dx. E11.22 08/05/15   Forcucci, Loma Sousa, PA-C  Insulin Syringe-Needle U-100 31G X 15/64" 0.5 ML MISC Use two pens daily with insulin 02/23/16   [name], PA-C  simvastatin (ZOCOR) 20 MG tablet Take 1 tablet at Bedtime for Cholesterol Patient not taking:  Reported on 06/06/2018 09/27/17 04/27/18  [name], PA-C    Family History Family History  Problem Relation Age of Onset  . CAD Mother 57       Died of MI  . Hypertension Mother   . Heart attack Mother   . Heart disease Mother   . CAD Father 41       Died of MI  . Heart disease Father   . Heart attack Father   . CAD Brother 44       Two brothers died of MI  . Heart attack Brother   . Heart disease Brother        Amputation  . Diabetes Sister   . Hypertension Sister   . Heart attack Brother   . Heart disease Brother   . Colon cancer Neg Hx   . Stomach cancer Neg Hx   . Esophageal cancer Neg Hx     Social History Social History   Tobacco Use  . Smoking status: Current Every Day Smoker    Packs/day: 0.25    Years: 40.00    Pack years: 10.00    Types: Cigarettes  . Smokeless tobacco: Never Used  Substance Use Topics  . Alcohol use: No    Alcohol/week: 0.0 standard drinks  . Drug use: No     Allergies   Ciprofloxacin; Plavix [clopidogrel bisulfate]; Amoxicillin; Ace inhibitors; Ciprofloxacin hcl; Lyrica [pregabalin]; and Penicillins   Review of Systems Review of Systems   Physical  Exam Updated Vital Signs BP (!) 169/74   Pulse 60   Temp 98.1 F (36.7 C) (Oral)   Resp 15   SpO2 94%   Physical Exam  Constitutional: She is oriented to person, place, and time. She appears well-developed and well-nourished. She does not appear ill.  HENT:  Head: Normocephalic and atraumatic.  Eyes: Pupils are equal, round, and reactive to light. EOM are normal.  Neck: Normal range of motion. Neck supple.  Cardiovascular: Normal rate, regular rhythm and normal pulses.  Pulmonary/Chest: Effort normal.  Abdominal: Soft. Bowel sounds are normal.  Musculoskeletal: Normal range of motion.  Neurological: She is oriented to person, place, and time. She has normal strength and normal reflexes. No cranial nerve deficit or sensory deficit. She displays a negative Romberg sign.  Skin: Skin is warm and dry. Capillary refill takes less than 2 seconds.  Psychiatric: She has a normal mood and affect.  Nursing note and vitals reviewed.    ED Treatments / Results  Labs (all labs ordered are listed, but only abnormal results are displayed) Labs Reviewed  BASIC METABOLIC PANEL - Abnormal; Notable for the following components:      Result Value   Glucose, Bld 366 (*)    Creatinine, Ser 1.41 (*)    GFR calc non Af Amer 37 (*)    GFR calc Af Amer 42 (*)    All other components within normal limits  CBC WITH DIFFERENTIAL/PLATELET - Abnormal; Notable for the following components:   WBC 10.6 (*)    Hemoglobin 15.3 (*)    HCT 47.0 (*)    RDW 11.1 (*)    All other components within normal limits  SEDIMENTATION RATE  I-STAT TROPONIN, ED    EKG EKG Interpretation  Date/Time:  Tuesday June 06 2018 13:41:53 EDT Ventricular Rate:  70 PR Interval:  130 QRS Duration: 94 QT Interval:  442 QTC Calculation: 477 R Axis:   40 Text Interpretation:  Normal sinus rhythm Normal ECG Confirmed by Lis Savitt,  Andee Poles [phone]) on 06/06/2018 6:27:06 PM   Radiology Dg Chest 2 View  Result Date:  06/06/2018 CLINICAL DATA:  Chest pain EXAM: CHEST - 2 VIEW COMPARISON:  CT chest 10/10/2017.  Chest x-Arleth Mccullar 08/01/2012. FINDINGS: The lungs are clear without focal pneumonia, edema, pneumothorax or pleural effusion. Right lower lobe calcified granuloma again noted. The cardiopericardial silhouette is within normal limits for size. The visualized bony structures of the thorax are intact. IMPRESSION: No active cardiopulmonary disease. Electronically Signed   By: Misty Stanley M.D.   On: 06/06/2018 14:47   Ct Head Wo Contrast  Result Date: 06/06/2018 CLINICAL DATA:  Headache for 4 days EXAM: CT HEAD WITHOUT CONTRAST TECHNIQUE: Contiguous axial images were obtained from the base of the skull through the vertex without intravenous contrast. COMPARISON:  Jan 25, 2017 head CT and brain MRI FINDINGS: Brain: Ventricles and sulci are within normal limits for age. There is no intracranial mass, hemorrhage, extra-axial fluid collection, or midline shift. There is evidence of a prior infarct in the posterior inferior right cerebellar region, stable. A prior infarct in the lateral left thalamus is subtle on this noncontrast enhanced CT examination. Elsewhere, there is minimal periventricular small vessel disease. No acute infarct is demonstrable on this study. Vascular: No hyperdense vessel. There is calcification in each carotid siphon region. Skull: Bony calvarium appears intact. Sinuses/Orbits: There is opacification in a posterior left ethmoid air cell. There is mucosal thickening in several ethmoid air cells. Orbits appear symmetric bilaterally. Other: Mastoid air cells are clear. IMPRESSION: Prior posterior inferior right cerebellar infarct. Prior infarct in the lateral left thalamus is not well seen by CT. There is slight periventricular small vessel disease. No acute infarct evident. No mass or hemorrhage. There are foci of carotid artery calcification. There is ethmoid sinus disease. Electronically Signed   By: Lowella Grip III M.D.   On: 06/06/2018 14:58    Procedures Procedures (including critical care time)  Medications Ordered in ED Medications  amLODipine (NORVASC) tablet 10 mg (10 mg Oral Given 06/06/18 1952)  irbesartan (AVAPRO) tablet 300 mg (has no administration in time range)  oxyCODONE-acetaminophen (PERCOCET/ROXICET) 5-325 MG per tablet 1 tablet (1 tablet Oral Not Given 06/06/18 1953)  prochlorperazine (COMPAZINE) injection 5 mg (has no administration in time range)     Initial Impression / Assessment and Plan / ED Course  I have reviewed the triage vital signs and the nursing notes.  Pertinent labs & imaging results that were available during my care of the patient were reviewed by me and considered in my medical decision making (see chart for details).    Headache- head ct without acute changes.  No evidence of sah- sxs present for 3-4 days. Neck nttp Hypertension- patient out of telmisartan and not take for 3 days.  BP trending down here with last bp 169/74.  Patient will be given rx and will have recheck in am Hyperglycemia- will need recheck no evidence of dka ckd- appears stable Chest pain- c.w. Palpitations- no definite pain.  EKG normal  Final Clinical Impressions(s) / ED Diagnoses   Final diagnoses:  Hypertension, unspecified type  Chronic nonintractable headache, unspecified headache type    ED Discharge Orders    None       Pattricia Boss, MD 06/06/18 2058    Pattricia Boss, MD 06/22/18 1456

## 2018-06-06 NOTE — Telephone Encounter (Signed)
Patient was seen on 04-12-17 for a stroke follow up by Dr. Erlinda Hong. She was to return in 4 months but cancelled because of work and did not reschedule. For the last 3 days she is experiencing pain on the left side of her head. This morning she is feeling nauseated and everything is spinning. I advised she needs to go to the ED but she was calling for an appointment. I offered her an appointment with [name] on 06-08-17 but she says she has to work. Please call.

## 2018-06-06 NOTE — ED Notes (Signed)
Pt declined compazine d/t IV route.  She states she can just take Motrin

## 2018-06-07 ENCOUNTER — Other Ambulatory Visit: Payer: Self-pay | Admitting: Internal Medicine

## 2018-06-07 DIAGNOSIS — I10 Essential (primary) hypertension: Secondary | ICD-10-CM

## 2018-06-07 MED ORDER — OLMESARTAN MEDOXOMIL 40 MG PO TABS: ORAL_TABLET | ORAL | 1 refills | Status: DC

## 2018-06-09 ENCOUNTER — Ambulatory Visit (INDEPENDENT_AMBULATORY_CARE_PROVIDER_SITE_OTHER): Payer: Medicare Other | Admitting: Podiatry

## 2018-06-09 ENCOUNTER — Encounter: Payer: Self-pay | Admitting: Podiatry

## 2018-06-09 DIAGNOSIS — M79674 Pain in right toe(s): Secondary | ICD-10-CM | POA: Diagnosis not present

## 2018-06-09 DIAGNOSIS — E1151 Type 2 diabetes mellitus with diabetic peripheral angiopathy without gangrene: Secondary | ICD-10-CM

## 2018-06-09 DIAGNOSIS — B351 Tinea unguium: Secondary | ICD-10-CM | POA: Diagnosis not present

## 2018-06-09 DIAGNOSIS — L84 Corns and callosities: Secondary | ICD-10-CM

## 2018-06-09 DIAGNOSIS — M79675 Pain in left toe(s): Secondary | ICD-10-CM

## 2018-06-11 NOTE — Progress Notes (Signed)
Assessment and Plan:  Essential hypertension -     bisoprolol-hydrochlorothiazide (ZIAC) 5-6.25 MG tablet; 1/2-1 TABLET DAILY Continue the benicar and norvasc, add on 1/2 ziac and increase to whole depending on BP Go to the ER if any chest pain, shortness of breath, nausea, dizziness, severe HA, changes vision/speech  Peripheral vascular disease due to secondary diabetes mellitus ([place]) Advised to quit smoking, given information about how to get patches for free  Cerebrovascular accident (CVA) due to thrombosis of left posterior cerebral artery ([place]) Control blood pressure, cholesterol, glucose, increase exercise.   PVD (peripheral vascular disease) ([place]) Advised to quit smoking  Atherosclerosis of aorta (HCC) Control blood pressure, cholesterol, glucose, increase exercise.  Stop smoking  CKD stage 3 due to type 2 diabetes mellitus ([place]) Increase water, will recheck kidney function next time Due to weight gain, patient would like to cut back on insulin and add on pill or something to help with weight loss  Insulin-requiring or dependent type II diabetes mellitus ([place]) Declines referral to RD or DE  Has follow up 1 month, will contact us through my chart in 1-2 weeks  Future Appointments  Date Time Provider [place]  07/19/2018  8:45 AM [name], PA-C [facility] None  09/05/2018  8:15 AM Marzetta Board, MD [facility]  10/02/2018 11:00 AM [facility] VASC 3 - EM [facility]  10/02/2018 12:00 PM [facility] HS VASC 3 - EM [facility]  10/02/2018 12:15 PM Nickel, Sharmon Leyden, NP [facility]  10/24/2018 10:30 AM [name], MD [facility] None  11/13/2018 10:30 AM [name], PA-C [facility] None  05/14/2019 10:00 AM [name], MD [facility] None     HPI 72 y.o.female with history of has Nontoxic multinodular goiter; CKD stage 3 due to type 2 diabetes mellitus ([place]); Glaucoma; Essential hypertension; Brain aneurysm; ESOPHAGEAL STRICTURE; Esophageal  reflux; Diverticulosis of large intestine; History of colonic polyps; Atherosclerosis of native arteries of extremity with intermittent claudication; Peripheral vascular disease due to secondary diabetes mellitus ([place]); Vitamin D deficiency; Medication management; History of CVA (cerebrovascular accident); Tobacco abuse; Generalized anxiety disorder; Depression, major, recurrent, in partial remission ([place]); Encounter for Medicare annual wellness exam; Hyperlipidemia, mixed; Insulin-requiring or dependent type II diabetes mellitus ([place]); Cerebrovascular accident (CVA) due to thrombosis of left posterior cerebral artery ([place]); PVD (peripheral vascular disease) ([place]); Emphysema of lung ([place]); Atherosclerosis of aorta ([place]); and Lung nodule on their problem list. presents for follow up from the ER. Patient went to the ER on 06/06/18, for: headache and hypertension. She was out of her telmisartan for 3 days, given RX in the ER. She had normal CT head, normal CXR and normal EKG.   She is on amlodipine and she was started on benicar 88m on sept 18th. She has BP cuff at home, still running 160-180's.   Her sugars were elevated at that time of 366 and her A1C was 8.7 last visit. She is on insulin 70/30, she is not on metformin. She is on 25 units AM and 20 PM, she will eat 1/2 apple. Her lowest sugar has been 99, and her highest is 188, it is doing better. She is frustrated with her weight.   BMI is Body mass index is 30.18 kg/m., she is working on diet and exercise. Wt Readings from Last 3 Encounters:  06/13/18 187 lb (84.8 kg)  04/18/18 187 lb 6.4 oz (85 kg)  03/27/18 186 lb (84.4 kg)    Blood pressure (!) 160/62, pulse 71, temperature 97.8 F (36.6  C), resp. rate 16, height _0  (1.676 m), weight 187 lb (84.8 kg), SpO2 94 %.    Lab Results  Component Value Date   HGBA1C 8.7 (H) 04/18/2018    Lab Results  Component Value Date   CREATININE 1.41 (H) 06/06/2018   BUN 16 06/06/2018   NA 138  06/06/2018   K 4.1 06/06/2018   CL 101 06/06/2018   CO2 25 06/06/2018    Dg Chest 2 View  Result Date: 06/06/2018 CLINICAL DATA:  Chest pain EXAM: CHEST - 2 VIEW COMPARISON:  CT chest 10/10/2017.  Chest x-ray 08/01/2012. FINDINGS: The lungs are clear without focal pneumonia, edema, pneumothorax or pleural effusion. Right lower lobe calcified granuloma again noted. The cardiopericardial silhouette is within normal limits for size. The visualized bony structures of the thorax are intact. IMPRESSION: No active cardiopulmonary disease. Electronically Signed   By: Misty Stanley M.D.   On: 06/06/2018 14:47   Ct Head Wo Contrast  Result Date: 06/06/2018 CLINICAL DATA:  Headache for 4 days EXAM: CT HEAD WITHOUT CONTRAST TECHNIQUE: Contiguous axial images were obtained from the base of the skull through the vertex without intravenous contrast. COMPARISON:  Jan 25, 2017 head CT and brain MRI FINDINGS: Brain: Ventricles and sulci are within normal limits for age. There is no intracranial mass, hemorrhage, extra-axial fluid collection, or midline shift. There is evidence of a prior infarct in the posterior inferior right cerebellar region, stable. A prior infarct in the lateral left thalamus is subtle on this noncontrast enhanced CT examination. Elsewhere, there is minimal periventricular small vessel disease. No acute infarct is demonstrable on this study. Vascular: No hyperdense vessel. There is calcification in each carotid siphon region. Skull: Bony calvarium appears intact. Sinuses/Orbits: There is opacification in a posterior left ethmoid air cell. There is mucosal thickening in several ethmoid air cells. Orbits appear symmetric bilaterally. Other: Mastoid air cells are clear. IMPRESSION: Prior posterior inferior right cerebellar infarct. Prior infarct in the lateral left thalamus is not well seen by CT. There is slight periventricular small vessel disease. No acute infarct evident. No mass or hemorrhage. There  are foci of carotid artery calcification. There is ethmoid sinus disease. Electronically Signed   By: Lowella Grip III M.D.   On: 06/06/2018 14:58     Past Medical History:  Diagnosis Date  . Abnormal findings on esophagogastroduodenoscopy (EGD) 07/2010  . Aneurysm [facility]) 2013   Right Brain   . Aneurysm ([place])    in brain x 2  . Anxiety   . Colon polyps   . Diabetes mellitus 1998  . Diverticulosis   . GERD (gastroesophageal reflux disease)   . Hemorrhoids   . Hiatal hernia   . Hyperlipidemia   . Hypertension 2000  . Migraines   . Peripheral vascular disease ([place])   . Phlebitis    30 years ago  left leg  . Status post colonoscopy 07/2010  . Stroke [facility])    multiple mini strokes ( brain aneurysm )     Allergies  Allergen Reactions  . Ciprofloxacin Other (See Comments)  . Plavix [Clopidogrel Bisulfate] Palpitations  . Amoxicillin Itching and Swelling    FACE & EYES SWELL  . Ace Inhibitors   . Ciprofloxacin Hcl Swelling  . Lyrica [Pregabalin]     Itch, gain weight  . Penicillins Other (See Comments)    REACTION: unspecified       Current Outpatient Medications (Endocrine & Metabolic):  .  insulin NPH-regular Human (NOVOLIN 70/30) (70-30)  100 UNIT/ML injection, INJECT 25 UNITS IN THE AM [place] WITH FOOD, AND INJECT 25 UNITS [place] WITH EVENING MEAL. (Patient taking differently: Inject 20-25 Units into the skin See admin instructions. INJECT 25 UNITS IN THE AM [place] WITH FOOD, AND INJECT 20 UNITS [place] WITH EVENING MEAL.)  Current Outpatient Medications (Cardiovascular):  .  amLODipine (NORVASC) 10 MG tablet, TAKE 1 TABLET BY MOUTH EVERY DAY (Patient taking differently: Take 10 mg by mouth daily. ) .  olmesartan (BENICAR) 40 MG tablet, Take 1 tablet daily for BP .  rosuvastatin (CRESTOR) 40 MG tablet, Take 1/2 to 1 tablet daily or as directed for Cholesterol (Patient taking differently: Take 20 mg by mouth at bedtime. Take 1/2 to 1 tablet daily or as directed for Cholesterol) .   simvastatin (ZOCOR) 20 MG tablet, Take 1 tablet at Bedtime for Cholesterol (Patient not taking: Reported on 06/06/2018)   Current Outpatient Medications (Analgesics):  .  aspirin EC 81 MG tablet, Take 81 mg by mouth at bedtime.    Current Outpatient Medications (Other):  .  Blood Glucose Monitoring Suppl (ACCU-CHEK AVIVA PLUS) W/DEVICE KIT, Check blood sugar up to three times daily .  Cholecalciferol (VITAMIN D PO), Take 5,000 Units by mouth daily. Marland Kitchen  CINNAMON PO, Take 1,000 mg by mouth 2 (two) times daily.  .  citalopram (CELEXA) 40 MG tablet, TAKE 1 TABLET DAILY FOR MOOD & ANXIETY (Patient taking differently: Take 20 mg by mouth daily. ) .  clonazePAM (KLONOPIN) 0.5 MG tablet, PLEASE SEE ATTACHED FOR DETAILED DIRECTIONS (Patient taking differently: Take 0.5 mg by mouth at bedtime. PLEASE SEE ATTACHED FOR DETAILED DIRECTIONS) .  glucose blood (ACCU-CHEK AVIVA PLUS) test strip, Use daily to check BS TID Dx. E11.22 .  Insulin Syringe-Needle U-100 31G X 15/64" 0.5 ML MISC, Use two pens daily with insulin .  Magnesium 500 MG TABS, Take 500 mg by mouth daily. .  Omega-3 Fatty Acids (FISH OIL) 1200 MG CAPS, Take 1,200 capsules by mouth daily.  Marland Kitchen  omeprazole (PRILOSEC) 40 MG capsule, Take 1 capsule (40 mg total) by mouth daily. Marland Kitchen  OVER THE COUNTER MEDICATION, Take 1 tablet by mouth daily. One a day vitamin Woman's one a day .  vitamin E 400 UNIT capsule, Take 400 Units by mouth daily.  ROS: all negative except above.   Physical Exam: There were no vitals filed for this visit. There were no vitals taken for this visit. General Appearance: Well nourished, in no apparent distress. Eyes: PERRLA, EOMs, conjunctiva no swelling or erythema Sinuses: No Frontal/maxillary tenderness ENT/Mouth: Ext aud canals clear, TMs without erythema, bulging. No erythema, swelling, or exudate on post pharynx.  Tonsils not swollen or erythematous. Hearing normal.  Neck: Supple, thyroid normal.  Respiratory:  Respiratory effort normal, BS equal bilaterally without rales, rhonchi, wheezing or stridor.  Cardio: RRR with no MRGs. Brisk peripheral pulses without edema.  Abdomen: Soft, + BS.  Non tender, no guarding, rebound, hernias, masses. Lymphatics: Non tender without lymphadenopathy.  Musculoskeletal: Full ROM, 5/5 strength, normal gait.  Skin: Warm, dry without rashes, lesions, ecchymosis.  Neuro: Cranial nerves intact. Normal muscle tone, no cerebellar symptoms. Sensation intact.  Psych: Awake and oriented X 3, normal affect, Insight and Judgment appropriate.     [name], PA-C 6:34 AM [facility]

## 2018-06-13 ENCOUNTER — Ambulatory Visit (INDEPENDENT_AMBULATORY_CARE_PROVIDER_SITE_OTHER): Payer: Medicare Other | Admitting: Physician Assistant

## 2018-06-13 ENCOUNTER — Encounter: Payer: Self-pay | Admitting: Physician Assistant

## 2018-06-13 VITALS — BP 160/62 | HR 71 | Temp 97.8°F | Resp 16 | Ht 66.0 in | Wt 187.0 lb

## 2018-06-13 DIAGNOSIS — N183 Chronic kidney disease, stage 3 (moderate): Secondary | ICD-10-CM | POA: Diagnosis not present

## 2018-06-13 DIAGNOSIS — I700 Atherosclerosis of aorta: Secondary | ICD-10-CM | POA: Diagnosis not present

## 2018-06-13 DIAGNOSIS — E1122 Type 2 diabetes mellitus with diabetic chronic kidney disease: Secondary | ICD-10-CM

## 2018-06-13 DIAGNOSIS — Z794 Long term (current) use of insulin: Secondary | ICD-10-CM | POA: Diagnosis not present

## 2018-06-13 DIAGNOSIS — I63332 Cerebral infarction due to thrombosis of left posterior cerebral artery: Secondary | ICD-10-CM | POA: Diagnosis not present

## 2018-06-13 DIAGNOSIS — I739 Peripheral vascular disease, unspecified: Secondary | ICD-10-CM | POA: Diagnosis not present

## 2018-06-13 DIAGNOSIS — I779 Disorder of arteries and arterioles, unspecified: Secondary | ICD-10-CM

## 2018-06-13 DIAGNOSIS — E1351 Other specified diabetes mellitus with diabetic peripheral angiopathy without gangrene: Secondary | ICD-10-CM

## 2018-06-13 DIAGNOSIS — E119 Type 2 diabetes mellitus without complications: Secondary | ICD-10-CM | POA: Diagnosis not present

## 2018-06-13 DIAGNOSIS — I10 Essential (primary) hypertension: Secondary | ICD-10-CM | POA: Diagnosis not present

## 2018-06-13 MED ORDER — BISOPROLOL-HYDROCHLOROTHIAZIDE 5-6.25 MG PO TABS: ORAL_TABLET | ORAL | 2 refills | Status: DC

## 2018-06-13 NOTE — Patient Instructions (Addendum)
Please add on ziac/bisproplol 5mg , start on 1/2 pill a day and can increase to a whole pill Goal is 120/80  HYPERTENSION INFORMATION  Monitor your blood pressure at home, please keep a record and bring that in with you to your next office visit.   Go to the ER if any CP, SOB, nausea, dizziness, severe HA, changes vision/speech  If you are willing, our goal BP is the top number of 120.  Your most recent BP: BP: (!) 160/62(LEFT ARM)   Take your medications faithfully as instructed. Maintain a healthy weight. Get at least 150 minutes of aerobic exercise per week. Minimize salt intake. Minimize alcohol intake  DASH Eating Plan DASH stands for "Dietary Approaches to Stop Hypertension." The DASH eating plan is a healthy eating plan that has been shown to reduce high blood pressure (hypertension). Additional health benefits may include reducing the risk of type 2 diabetes mellitus, heart disease, and stroke. The DASH eating plan may also help with weight loss. WHAT DO I NEED TO KNOW ABOUT THE DASH EATING PLAN? For the DASH eating plan, you will follow these general guidelines:  Choose foods with a percent daily value for sodium of less than 5% (as listed on the food label).  Use salt-free seasonings or herbs instead of table salt or sea salt.  Check with your health care provider or pharmacist before using salt substitutes.  Eat lower-sodium products, often labeled as "lower sodium" or "no salt added."  Eat fresh foods.  Eat more vegetables, fruits, and low-fat dairy products.  Choose whole grains. Look for the word "whole" as the first word in the ingredient list.  Choose fish and skinless chicken or [place] more often than red meat. Limit fish, poultry, and meat to 6 oz (170 g) each day.  Limit sweets, desserts, sugars, and sugary drinks.  Choose heart-healthy fats.  Limit cheese to 1 oz (28 g) per day.  Eat more home-cooked food and less restaurant, buffet, and fast  food.  Limit fried foods.  Cook foods using methods other than frying.  Limit canned vegetables. If you do use them, rinse them well to decrease the sodium.  When eating at a restaurant, ask that your food be prepared with less salt, or no salt if possible. WHAT FOODS CAN I EAT? Seek help from a dietitian for individual calorie needs. Grains Whole grain or whole wheat bread. Brown rice. Whole grain or whole wheat pasta. Quinoa, bulgur, and whole grain cereals. Low-sodium cereals. Corn or whole wheat flour tortillas. Whole grain cornbread. Whole grain crackers. Low-sodium crackers. Vegetables Fresh or frozen vegetables (raw, steamed, roasted, or grilled). Low-sodium or reduced-sodium tomato and vegetable juices. Low-sodium or reduced-sodium tomato sauce and paste. Low-sodium or reduced-sodium canned vegetables.  Fruits All fresh, canned (in natural juice), or frozen fruits. Meat and Other Protein Products Ground beef (85% or leaner), grass-fed beef, or beef trimmed of fat. Skinless chicken or [place]. Ground chicken or [place]. Pork trimmed of fat. All fish and seafood. Eggs. Dried beans, peas, or lentils. Unsalted nuts and seeds. Unsalted canned beans. Dairy Low-fat dairy products, such as skim or 1% milk, 2% or reduced-fat cheeses, low-fat ricotta or cottage cheese, or plain low-fat yogurt. Low-sodium or reduced-sodium cheeses. Fats and Oils Tub margarines without trans fats. Light or reduced-fat mayonnaise and salad dressings (reduced sodium). Avocado. Safflower, olive, or canola oils. Natural peanut or almond butter. Other Unsalted popcorn and pretzels. The items listed above may not be a complete list of recommended foods  or beverages. Contact your dietitian for more options. WHAT FOODS ARE NOT RECOMMENDED? Grains White bread. White pasta. White rice. Refined cornbread. Bagels and croissants. Crackers that contain trans fat. Vegetables Creamed or fried vegetables. Vegetables in a  cheese sauce. Regular canned vegetables. Regular canned tomato sauce and paste. Regular tomato and vegetable juices. Fruits Dried fruits. Canned fruit in light or heavy syrup. Fruit juice. Meat and Other Protein Products Fatty cuts of meat. Ribs, chicken wings, bacon, sausage, bologna, salami, chitterlings, fatback, hot dogs, bratwurst, and packaged luncheon meats. Salted nuts and seeds. Canned beans with salt. Dairy Whole or 2% milk, cream, half-and-half, and cream cheese. Whole-fat or sweetened yogurt. Full-fat cheeses or blue cheese. Nondairy creamers and whipped toppings. Processed cheese, cheese spreads, or cheese curds. Condiments Onion and garlic salt, seasoned salt, table salt, and sea salt. Canned and packaged gravies. Worcestershire sauce. Tartar sauce. Barbecue sauce. Teriyaki sauce. Soy sauce, including reduced sodium. Steak sauce. Fish sauce. Oyster sauce. Cocktail sauce. Horseradish. Ketchup and mustard. Meat flavorings and tenderizers. Bouillon cubes. Hot sauce. Tabasco sauce. Marinades. Taco seasonings. Relishes. Fats and Oils Butter, stick margarine, lard, shortening, ghee, and bacon fat. Coconut, palm kernel, or palm oils. Regular salad dressings. Other Pickles and olives. Salted popcorn and pretzels. The items listed above may not be a complete list of foods and beverages to avoid. Contact your dietitian for more information. WHERE CAN I FIND MORE INFORMATION? National Heart, Lung, and Blood Institute: travelstabloid.com Document Released: 08/26/2011 Document Revised: 01/21/2014 Document Reviewed: 07/11/2013 [facility] Patient Information 2015 [place], [place]. This information is not intended to replace advice given to you by your health care provider. Make sure you discuss any questions you have with your health care provider.   SUGGEST SEEING A DIABETIC EDUCATOR OR REGISTERED DIETICIAN    Your A1C is a measure of your sugar over the past 3  months and is not affected by what you have eaten over the past few days. Diabetes increases your chances of stroke and heart attack over 300 % and is the leading cause of blindness and kidney failure in the [place]. Please make sure you decrease bad carbs like white bread, white rice, potatoes, corn, soft drinks, pasta, cereals, refined sugars, sweet tea, dried fruits, and fruit juice. Good carbs are okay to eat in moderation like sweet potatoes, brown rice, whole grain pasta/bread, most fruit (except dried fruit) and you can eat as many veggies as you want.   Greater than 6.5 is considered diabetic. Between 6.4 and 5.7 is prediabetic If your A1C is less than 5.7 you are NOT diabetic.  Targets for Glucose Readings: Time of Check Target for patients WITHOUT Diabetes Target for DIABETICS  Before Meals Less than 100  less than 150  Two hours after meals Less than 200  Less than 250     Diabetes or even increased sugars put you at 300% increased risk of heart attack and stroke.  ALSO BEING DIABETIC YOU MAY NOT HAVE ANY PAIN WITH A HEART ATTACK.  Even worse of a chance of no pain if you are a woman.  It is very unlikely that you will have any pain with a heart attack. Likely your symptoms will be very subtle, even for very severe disease.  Your symptoms for a heart attack will likely occur when you exert your self or exercise and include: Shortness of breath Sweating Nausea Dizziness Fast or irregular heart beats Fatigue   It makes me feel better if my diabetics get their  heart rate up with exercise once or twice a week and pay close attention to your body. If there is ANY change in your exercise capacity or if you have symptoms above, please STOP and call 911 or call to come to the office.   PLEASE REMEMBER:  Diabetes is preventable! Up to 74 percent of complications and morbidities among individuals with type 2 diabetes can be prevented, delayed, or effectively treated and minimized  with regular visits to a health professional, appropriate monitoring and medication, and a healthy diet and lifestyle.   Here is some information to help you keep your heart healthy: Move it! - Aim for 30 mins of activity every day. Take it slowly at first. Talk to [place] before starting any new exercise program.   Lose it.  -Body Mass Index (BMI) can indicate if you need to lose weight. A healthy range is 18.5-24.9. For a BMI calculator, go to [employer].com  Waist Management -Excess abdominal fat is a risk factor for heart disease, diabetes, asthma, stroke and more. Ideal waist circumference is less than 35" for women and less than 40" for men.   Eat Right -focus on fruits, vegetables, whole grains, and meals you make yourself. Avoid foods with trans fat and high sugar/sodium content.   Snooze or Snore? - Loud snoring can be a sign of sleep apnea, a significant risk factor for high blood pressure, heart attach, stroke, and heart arrhythmias.  Kick the habit -Quit Smoking! Avoid second hand smoke. A single cigarette raises your blood pressure for 20 mins and increases the risk of heart attack and stroke for the next 24 hours.   Are Aspirin and Supplements right for you? -Add ENTERIC COATED low dose 81 mg Aspirin daily OR can do every other day if you have easy bruising to protect your heart and head. As well as to reduce risk of Colon Cancer by 20 %, Skin Cancer by 26 % , Melanoma by 46% and Pancreatic cancer by 60%  Say "No to Stress -There may be little you can do about problems that cause stress. However, techniques such as long walks, meditation, and exercise can help you manage it.   Start Now! - Make changes one at a time and set reasonable goals to increase your likelihood of success.     [place]  American cancer society  (573)[phone] for more information or for a free program for smoking cessation help.   You can call QUIT SMART 1-800-QUIT-NOW for free  nicotine patches or replacement therapy- if they are out- keep calling  [facility] Can call for smoking cessation classes, 2525221209  If you have a smart phone, please look up Smoke Free app, this will help you stay on track and give you information about money you have saved, life that you have gained back and a ton of more information.   ADVANTAGES OF QUITTING SMOKING  Within 20 minutes, blood pressure decreases. Your pulse is at normal level.  After 8 hours, carbon monoxide levels in the blood return to normal. Your oxygen level increases.  After 24 hours, the chance of having a heart attack starts to decrease. Your breath, hair, and body stop smelling like smoke.  After 48 hours, damaged nerve endings begin to recover. Your sense of taste and smell improve.  After 72 hours, the body is virtually free of nicotine. Your bronchial tubes relax and breathing becomes easier.  After 2 to 12 weeks, lungs can hold more air.  Exercise becomes easier and circulation improves.  After 1 year, the risk of coronary heart disease is cut in half.  After 5 years, the risk of stroke falls to the same as a nonsmoker.  After 10 years, the risk of lung cancer is cut in half and the risk of other cancers decreases significantly.  After 15 years, the risk of coronary heart disease drops, usually to the level of a nonsmoker.  You will have extra money to spend on things other than cigarettes.

## 2018-06-14 ENCOUNTER — Encounter: Payer: Self-pay | Admitting: Podiatry

## 2018-06-14 NOTE — Progress Notes (Signed)
Subjective: [name] presents today for at-risk footcare. She has extensive cerebrovascular/peripheral vascular history with diabetes with peripheral arterial disease. She is followed by her PCP and Cardiology.  She has documented claudication and is also s/p stenting left superficial femoral artery.  She is seen today for painful, mycotic toenails and corn/callus which interfere with daily activities and routine tasks.  Her pain is aggravated when wearing enclosed shoe gear and is relieved with periodic professional debridement.   Objective:  There were no vitals filed for this visit.  Vascular Examination: Capillary refill time <3 seconds x 10 digits Dorsalis pedis pulses absent b/l Posterior tibial pulses absent b/l No ischemia/gangrene noted b/l feet No digital hair x 10 digits Skin temperature warm to cool b/l  Dermatological Examination: Skin thin, shiny and atrophic b/l Toenails 1-5 b/l discolored, thick, dystrophic with subungual debris and pain with palpation to nailbeds due to thickness of nails. No clinical signs of bacterial infection noted.  Right 5th digit with hyperkeratosis lateral nail border consistent with Lister's corn  Hyperkeratotic lesion submetatarsal head 3 left foot. No flocculence, no perilesional erythema, no edema, no subdermal abscess, no underlying ulceration.  Musculoskeletal: Muscle strength 5/5 to all LE muscle groups  Neurological: Sensation intact with 10 gram monofilament b/l Vibratory sensation intact b/l  Assessment: 1. Painful onychomycosis toenails 1-5 b/l 2. 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 NIDDM with PAD  Plan: 1. Toenails 1-5 b/l were debrided in length and girth without iatrogenic bleeding. 2. Callus(es) pared: submetatarsal head 3 left foot 3. Lister's corn pared: right 5th digit 4. Dispensed aperture pad for callus 5. Patient to continue soft, supportive shoe gear 6. Patient to  report any pedal injuries to medical professional  7. Follow up 3 months.  8. Patient/POA to call should there be a concern in the interim.

## 2018-07-17 NOTE — Progress Notes (Signed)
Patient ID: [name], female   DOB: Jul 04, 1946, 72 y.o.   MRN: [id number]  Assessment and Plan:  Essential hypertension - continue medications, DASH diet, exercise and monitor at home. Call if greater than 130/80.  -     CBC with Differential/Platelet -     BASIC METABOLIC PANEL WITH GFR -     Hepatic function panel -     TSH  Peripheral vascular disease due to secondary diabetes mellitus ([place]) Discussed general issues about diabetes pathophysiology and management., Educational material distributed., Suggested low cholesterol diet., Encouraged aerobic exercise., Discussed foot care., Reminded to get yearly retinal exam. -having some hypoglycemia- will give sheets to keep log of food and insulin - long discussion about sugars- close follow up 1 month -     Lipid panel  Cerebrovascular accident (CVA) due to thrombosis of left posterior cerebral artery ([place]) Control blood pressure, cholesterol, glucose, increase exercise.   PVD (peripheral vascular disease) ([facility]) Control blood pressure, cholesterol, glucose, increase exercise.  -     Lipid panel  CKD stage 3 due to type 2 diabetes mellitus ([place]) Discussed general issues about diabetes pathophysiology and management., Educational material distributed., Suggested low cholesterol diet., Encouraged aerobic exercise., Discussed foot care., Reminded to get yearly retinal exam. -having some hypoglycemia- will do 20 at night and 25 in the AM -     BASIC METABOLIC PANEL WITH GFR  Insulin-requiring or dependent type II diabetes mellitus ([place]) Discussed need to try to do morning and night, morning should be higher, not better -     Hemoglobin A1c  Medication management -     Magnesium  Tobacco abuse Trying to quit, cutting back, 1 pack will last 3 days.   Depression, major, recurrent, in partial remission ([place]) - continue medications, stress management techniques discussed, increase water, good sleep hygiene discussed, increase exercise,  and increase veggies.   Hyperlipidemia, unspecified hyperlipidemia type -continue medications, check lipids, decrease fatty foods, increase activity.  -     Lipid panel  COPD Counseled on smoking cessation- sent in wellbutrin  Continue diet and meds as discussed. Further disposition pending results of labs. Discussed med's effects and SE's.    Future Appointments  Date Time Provider [place]  09/05/2018  8:15 AM Marzetta Board, MD [facility]  10/02/2018 11:00 AM [facility] - EM [facility]  10/02/2018 12:00 PM [facility] - EM [facility]  10/02/2018 12:15 PM Nickel, Sharmon Leyden, NP [facility]  10/24/2018 10:30 AM [name], MD [facility] None  11/13/2018 10:30 AM [name], PA-C [facility] None  05/14/2019 10:00 AM [name], MD [facility] None     HPI 72 y.o. female  presents for 3 month follow up with hypertension, hyperlipidemia, diabetes and vitamin D deficiency.  Her blood pressure has been controlled at home, today their BP is BP: 138/90.She does not workout. She denies chest pain, shortness of breath, dizziness. 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   She has history of PVD s/p fem-pop bypass and stent due to DM2.   Has history of CVA on 01/25/17, she has plavix allergy, so continued on ASA and zocor and aggrenox, she is following with neuro.   She has COPD, she continues to smoke, she is interested in quitting.    She is on cholesterol medication and denies myalgias. Her cholesterol is at goal. The cholesterol was:  04/18/2018: Cholesterol 197;  HDL 49; LDL Cholesterol (Calc) 125; Triglycerides 124    She has been working on diet and exercise for diabetes with diabetic chronic kidney disease, with other circulatory complications and with diabetic polyneuropathy, she is on ASA, she is on ACE/ARB, she is on 25-30 units in the AM and 20-25 at night 70/30 mix BID-could not  tolerate invokana due to CKD, SUGARS HAVE BEEN 88-200 and denies  foot ulcerations, hypoglycemia , increased appetite, nausea, paresthesia of the feet, polydipsia, polyuria, visual disturbances, vomiting and weight loss. Last A1C was: 04/18/2018: Hgb A1c MFr Bld 8.7.  She saw Dr. Dwyane Dee once but has not been back.  Lab Results  Component Value Date   GFRAA 42 (L) 06/06/2018   BMI is Body mass index is 30.51 kg/m., she is working on diet and exercise. Wt Readings from Last 3 Encounters:  07/19/18 189 lb (85.7 kg)  06/13/18 187 lb (84.8 kg)  04/18/18 187 lb 6.4 oz (85 kg)   She takes klonopin at night only to help with sleep.   Current Medications:  Current Outpatient Medications on File Prior to Visit  Medication Sig Dispense Refill  . amLODipine (NORVASC) 10 MG tablet TAKE 1 TABLET BY MOUTH EVERY DAY (Patient taking differently: Take 10 mg by mouth daily. ) 90 tablet 1  . aspirin EC 81 MG tablet Take 81 mg by mouth at bedtime.     . bisoprolol-hydrochlorothiazide (ZIAC) 5-6.25 MG tablet 1/2-1 TABLET DAILY 30 tablet 2  . Blood Glucose Monitoring Suppl (ACCU-CHEK AVIVA PLUS) W/DEVICE KIT Check blood sugar up to three times daily 1 kit 0  . Cholecalciferol (VITAMIN D PO) Take 5,000 Units by mouth daily.    Marland Kitchen CINNAMON PO Take 1,000 mg by mouth 2 (two) times daily.     . citalopram (CELEXA) 40 MG tablet TAKE 1 TABLET DAILY FOR MOOD & ANXIETY (Patient taking differently: Take 20 mg by mouth daily. ) 90 tablet 1  . clonazePAM (KLONOPIN) 0.5 MG tablet PLEASE SEE ATTACHED FOR DETAILED DIRECTIONS (Patient taking differently: Take 0.5 mg by mouth at bedtime. PLEASE SEE ATTACHED FOR DETAILED DIRECTIONS) 60 tablet 1  . glucose blood (ACCU-CHEK AVIVA PLUS) test strip Use daily to check BS TID Dx. E11.22 300 each 1  . insulin NPH-regular Human (NOVOLIN 70/30) (70-30) 100 UNIT/ML injection INJECT 25 UNITS IN THE AM [place] WITH FOOD, AND INJECT 25 UNITS [place] WITH EVENING MEAL. (Patient taking differently: Inject  20-25 Units into the skin See admin instructions. INJECT 25 UNITS IN THE AM [place] WITH FOOD, AND INJECT 20 UNITS [place] WITH EVENING MEAL.) 60 mL 1  . Insulin Syringe-Needle U-100 31G X 15/64" 0.5 ML MISC Use two pens daily with insulin 100 each 3  . Magnesium 500 MG TABS Take 500 mg by mouth daily.    Marland Kitchen olmesartan (BENICAR) 40 MG tablet Take 1 tablet daily for BP 90 tablet 1  . Omega-3 Fatty Acids (FISH OIL) 1200 MG CAPS Take 1,200 capsules by mouth daily.     Marland Kitchen omeprazole (PRILOSEC) 40 MG capsule Take 1 capsule (40 mg total) by mouth daily. 90 capsule 2  . OVER THE COUNTER MEDICATION Take 1 tablet by mouth daily. One a day vitamin Woman's one a day    . rosuvastatin (CRESTOR) 40 MG tablet Take 1/2 to 1 tablet daily or as directed for Cholesterol (Patient taking differently: Take 20 mg by mouth at bedtime. Take 1/2 to 1 tablet daily or as directed for Cholesterol) 90 tablet 1  .  vitamin E 400 UNIT capsule Take 400 Units by mouth daily.     No current facility-administered medications on file prior to visit.    Medical History:  Past Medical History:  Diagnosis Date  . Abnormal findings on esophagogastroduodenoscopy (EGD) 07/2010  . 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 Anxiety   . Colon polyps   . Diabetes mellitus 1998  . Diverticulosis   . GERD (gastroesophageal reflux disease)   . Hemorrhoids   . Hiatal hernia   . Hyperlipidemia   . Hypertension 2000  . Migraines   . Peripheral vascular disease ([place])   . Phlebitis    30 years ago  left leg  . Status post colonoscopy 07/2010  . Stroke [facility])    multiple mini strokes ( brain aneurysm )   Allergies:  Allergies  Allergen Reactions  . Ciprofloxacin Other (See Comments)  . Plavix [Clopidogrel Bisulfate] Palpitations  . Amoxicillin Itching and Swelling    FACE & EYES SWELL  . Ace Inhibitors   . Ciprofloxacin Hcl Swelling  . Lyrica [Pregabalin]     Itch, gain weight  . Penicillins Other (See  Comments)    REACTION: unspecified     Review of Systems:  Review of Systems  Constitutional: Negative for chills, fever and malaise/fatigue.  HENT: Negative for congestion, ear pain and sore throat.   Eyes: Negative.   Respiratory: Negative for cough, shortness of breath and wheezing.   Cardiovascular: Negative for chest pain, palpitations and leg swelling.  Gastrointestinal: Negative for abdominal pain, blood in stool, constipation, diarrhea, heartburn, melena, nausea and vomiting.  Genitourinary: Negative.   Musculoskeletal: Positive for joint pain. Negative for back pain, falls, myalgias and neck pain.  Skin: Negative.   Neurological: Positive for sensory change (left leg, left face). Negative for dizziness, loss of consciousness and headaches.  Psychiatric/Behavioral: Negative for depression. The patient is not nervous/anxious and does not have insomnia.     Family history- Review and unchanged  Social history- Review and unchanged  Physical Exam: BP 138/90   Pulse 60   Temp (!) 97.2 F (36.2 C)   Resp 16   Ht 5' 6"  (1.676 m)   Wt 189 lb (85.7 kg)   SpO2 94%   BMI 30.51 kg/m  Wt Readings from Last 3 Encounters:  07/19/18 189 lb (85.7 kg)  06/13/18 187 lb (84.8 kg)  04/18/18 187 lb 6.4 oz (85 kg)   General Appearance: Well nourished well developed, non-toxic appearing, in no apparent distress. Eyes: PERRLA, EOMs, conjunctiva no swelling or erythema ENT/Mouth: Ear canals clear with no erythema, swelling, or discharge.  TMs normal bilaterally, oropharynx clear, moist, with no exudate.   Neck: Supple, thyroid normal, no JVD, no cervical adenopathy.  Respiratory: Respiratory effort normal, breath sounds clear A&P, no wheeze, rhonchi or rales noted.  No retractions, no accessory muscle usage Cardio: RRR with no MRGs. No noted edema.  Abdomen: Soft, + BS.  Non tender, no guarding, rebound, hernias, masses. Musculoskeletal: Full ROM, 5/5 strength, Normal gait Skin: Warm,  dry without rashes, lesions, ecchymosis.  Neuro: Awake and oriented X 3, Cranial nerves intact. No cerebellar symptoms.  Psych: normal affect, Insight and Judgment appropriate.    [name], PA-C 9:42 AM [facility]

## 2018-07-19 ENCOUNTER — Ambulatory Visit (INDEPENDENT_AMBULATORY_CARE_PROVIDER_SITE_OTHER): Payer: Medicare Other | Admitting: Physician Assistant

## 2018-07-19 ENCOUNTER — Ambulatory Visit: Payer: Self-pay | Admitting: Physician Assistant

## 2018-07-19 ENCOUNTER — Encounter: Payer: Self-pay | Admitting: Physician Assistant

## 2018-07-19 VITALS — BP 138/90 | HR 60 | Temp 97.2°F | Resp 16 | Ht 66.0 in | Wt 189.0 lb

## 2018-07-19 DIAGNOSIS — I779 Disorder of arteries and arterioles, unspecified: Secondary | ICD-10-CM | POA: Diagnosis not present

## 2018-07-19 DIAGNOSIS — E1122 Type 2 diabetes mellitus with diabetic chronic kidney disease: Secondary | ICD-10-CM

## 2018-07-19 DIAGNOSIS — N183 Chronic kidney disease, stage 3 (moderate): Secondary | ICD-10-CM | POA: Diagnosis not present

## 2018-07-19 DIAGNOSIS — F3341 Major depressive disorder, recurrent, in partial remission: Secondary | ICD-10-CM

## 2018-07-19 DIAGNOSIS — Z794 Long term (current) use of insulin: Secondary | ICD-10-CM | POA: Diagnosis not present

## 2018-07-19 DIAGNOSIS — I700 Atherosclerosis of aorta: Secondary | ICD-10-CM | POA: Diagnosis not present

## 2018-07-19 DIAGNOSIS — J439 Emphysema, unspecified: Secondary | ICD-10-CM | POA: Diagnosis not present

## 2018-07-19 DIAGNOSIS — I739 Peripheral vascular disease, unspecified: Secondary | ICD-10-CM | POA: Diagnosis not present

## 2018-07-19 DIAGNOSIS — E782 Mixed hyperlipidemia: Secondary | ICD-10-CM | POA: Diagnosis not present

## 2018-07-19 DIAGNOSIS — E1351 Other specified diabetes mellitus with diabetic peripheral angiopathy without gangrene: Secondary | ICD-10-CM | POA: Diagnosis not present

## 2018-07-19 DIAGNOSIS — E119 Type 2 diabetes mellitus without complications: Secondary | ICD-10-CM

## 2018-07-19 DIAGNOSIS — Z720 Tobacco use: Secondary | ICD-10-CM | POA: Diagnosis not present

## 2018-07-19 DIAGNOSIS — I10 Essential (primary) hypertension: Secondary | ICD-10-CM

## 2018-07-19 DIAGNOSIS — Z79899 Other long term (current) drug therapy: Secondary | ICD-10-CM

## 2018-07-19 MED ORDER — BUPROPION HCL ER (XL) 150 MG PO TB24: 150.0000 mg | ORAL_TABLET | ORAL | 2 refills | Status: DC

## 2018-07-19 NOTE — Patient Instructions (Addendum)
ALLERGY MEDICATIONS OVER THE COUNTER  Please pick one of the over the counter allergy medications below and take it once daily for allergies.  Claritin or loratadine cheapest but likely the weakest  Zyrtec or certizine at night because it can make you sleepy The strongest is allegra or fexafinadine  Cheapest at [employer], [employer], [employer]  Can do a steroid nasal spary like nasonex or flonase 1-2 sparys at night each nostril. Remember to spray each nostril twice towards the outer part of your eye.  Do not sniff but instead pinch your nose and tilt your head back to help the medicine get into your sinuses.  The best time to do this is at bedtime. Stop if you get blurred vision or nose bleeds.    The norvasc/amlodipine can take 1/2 in the AM and 1/2 at night OR you can take it at night time to see if this helps with the dizziness [place] or Benicar take in the morning  [place]  [facility] [place]  [phone] for more information or for a free program for smoking cessation help.   You can call QUIT SMART 1-800-QUIT-NOW for free nicotine patches or replacement therapy- if they are out- keep calling  [facility] Can call for smoking cessation classes, [phone]  If you have a smart phone, please look up Smoke Free app, this will help you stay on track and give you information about money you have saved, life that you have gained back and a ton of more information.   ADVANTAGES OF QUITTING SMOKING  Within 20 minutes, blood pressure decreases. Your pulse is at normal level.  After 8 hours, carbon monoxide levels in the blood return to normal. Your oxygen level increases.  After 24 hours, the chance of having a heart attack starts to decrease. Your breath, hair, and body stop smelling like smoke.  After 48 hours, damaged nerve endings begin to  recover. Your sense of taste and smell improve.  After 72 hours, the body is virtually free of nicotine. Your bronchial tubes relax and breathing becomes easier.  After 2 to 12 weeks, lungs can hold more air. Exercise becomes easier and circulation improves.  After 1 year, the risk of coronary heart disease is cut in half.  After 5 years, the risk of stroke falls to the same as a nonsmoker.  After 10 years, the risk of lung cancer is cut in half and the risk of other cancers decreases significantly.  After 15 years, the risk of coronary heart disease drops, usually to the level of a nonsmoker.  You will have extra money to spend on things other than cigarettes.     When it comes to diets, agreement about the perfect plan isn't easy to find, even among the experts. Experts at the [employer] developed an idea known as the Healthy Eating Plate. Just imagine a plate divided into logical, healthy portions.  The emphasis is on diet quality:  Load up on vegetables and fruits - one-half of your plate: Aim for color and variety, and remember that potatoes don't count.  Go for whole grains - one-quarter of your plate: Whole wheat, barley, wheat berries, quinoa, oats, brown rice, and foods made with them. If you want pasta, go with whole wheat pasta.  Protein power - one-quarter of your plate: Fish, chicken, beans, and  nuts are all healthy, versatile protein sources. Limit red meat.  The diet, however, does go beyond the plate, offering a few other suggestions.  Use healthy plant oils, such as olive, canola, soy, corn, sunflower and peanut. Check the labels, and avoid partially hydrogenated oil, which have unhealthy trans fats.  If you're thirsty, drink water. Coffee and tea are good in moderation, but skip sugary drinks and limit milk and dairy products to one or two daily servings.  The type of carbohydrate in the diet is more important than the amount. Some sources of  carbohydrates, such as vegetables, fruits, whole grains, and beans-are healthier than others.  Finally, stay active.   DASH Eating Plan DASH stands for "Dietary Approaches to Stop Hypertension." The DASH eating plan is a healthy eating plan that has been shown to reduce high blood pressure (hypertension). It may also reduce your risk for type 2 diabetes, heart disease, and stroke. The DASH eating plan may also help with weight loss. What are tips for following this plan? General guidelines  Avoid eating more than 2,300 mg (milligrams) of salt (sodium) a day. If you have hypertension, you may need to reduce your sodium intake to 1,500 mg a day.  Limit alcohol intake to no more than 1 drink a day for nonpregnant women and 2 drinks a day for men. One drink equals 12 oz of beer, 5 oz of wine, or 1 oz of hard liquor.  Work with your health care provider to maintain a healthy body weight or to lose weight. Ask what an ideal weight is for you.  Get at least 30 minutes of exercise that causes your heart to beat faster (aerobic exercise) most days of the week. Activities may include walking, swimming, or biking.  Work with your health care provider or diet and nutrition specialist (dietitian) to adjust your eating plan to your individual calorie needs. Reading food labels  Check food labels for the amount of sodium per serving. Choose foods with less than 5 percent of the Daily Value of sodium. Generally, foods with less than 300 mg of sodium per serving fit into this eating plan.  To find whole grains, look for the word "whole" as the first word in the ingredient list. Shopping  Buy products labeled as "low-sodium" or "no salt added."  Buy fresh foods. Avoid canned foods and premade or frozen meals. Cooking  Avoid adding salt when cooking. Use salt-free seasonings or herbs instead of table salt or sea salt. Check with your health care provider or pharmacist before using salt  substitutes.  Do not fry foods. Cook foods using healthy methods such as baking, boiling, grilling, and broiling instead.  Cook with heart-healthy oils, such as olive, canola, soybean, or sunflower oil. Meal planning   Eat a balanced diet that includes: ? 5 or more servings of fruits and vegetables each day. At each meal, try to fill half of your plate with fruits and vegetables. ? Up to 6-8 servings of whole grains each day. ? Less than 6 oz of lean meat, poultry, or fish each day. A 3-oz serving of meat is about the same size as a deck of cards. One egg equals 1 oz. ? 2 servings of low-fat dairy each day. ? A serving of nuts, seeds, or beans 5 times each week. ? Heart-healthy fats. Healthy fats called Omega-3 fatty acids are found in foods such as flaxseeds and coldwater fish, like sardines, salmon, and mackerel.  Limit how much you eat  of the following: ? Canned or prepackaged foods. ? Food that is high in trans fat, such as fried foods. ? Food that is high in saturated fat, such as fatty meat. ? Sweets, desserts, sugary drinks, and other foods with added sugar. ? Full-fat dairy products.  Do not salt foods before eating.  Try to eat at least 2 vegetarian meals each week.  Eat more home-cooked food and less restaurant, buffet, and fast food.  When eating at a restaurant, ask that your food be prepared with less salt or no salt, if possible. What foods are recommended? The items listed may not be a complete list. Talk with your dietitian about what dietary choices are best for you. Grains Whole-grain or whole-wheat bread. Whole-grain or whole-wheat pasta. Brown rice. [place]. Bulgur. Whole-grain and low-sodium cereals. Pita bread. Low-fat, low-sodium crackers. Whole-wheat flour tortillas. Vegetables Fresh or frozen vegetables (raw, steamed, roasted, or grilled). Low-sodium or reduced-sodium tomato and vegetable juice. Low-sodium or reduced-sodium tomato sauce and tomato  paste. Low-sodium or reduced-sodium canned vegetables. Fruits All fresh, dried, or frozen fruit. Canned fruit in natural juice (without added sugar). Meat and other protein foods Skinless chicken or [place]. Ground chicken or [place]. Pork with fat trimmed off. Fish and seafood. Egg whites. Dried beans, peas, or lentils. Unsalted nuts, nut butters, and seeds. Unsalted canned beans. Lean cuts of beef with fat trimmed off. Low-sodium, lean deli meat. Dairy Low-fat (1%) or fat-free (skim) milk. Fat-free, low-fat, or reduced-fat cheeses. Nonfat, low-sodium ricotta or cottage cheese. Low-fat or nonfat yogurt. Low-fat, low-sodium cheese. Fats and oils Soft margarine without trans fats. Vegetable oil. Low-fat, reduced-fat, or light mayonnaise and salad dressings (reduced-sodium). Canola, safflower, olive, soybean, and sunflower oils. Avocado. Seasoning and other foods Herbs. Spices. Seasoning mixes without salt. Unsalted popcorn and pretzels. Fat-free sweets. What foods are not recommended? The items listed may not be a complete list. Talk with your dietitian about what dietary choices are best for you. Grains Baked goods made with fat, such as croissants, muffins, or some breads. Dry pasta or rice meal packs. Vegetables Creamed or fried vegetables. Vegetables in a cheese sauce. Regular canned vegetables (not low-sodium or reduced-sodium). Regular canned tomato sauce and paste (not low-sodium or reduced-sodium). Regular tomato and vegetable juice (not low-sodium or reduced-sodium). Angie Fava. Olives. Fruits Canned fruit in a light or heavy syrup. Fried fruit. Fruit in cream or butter sauce. Meat and other protein foods Fatty cuts of meat. Ribs. Fried meat. Berniece Salines. Sausage. Bologna and other processed lunch meats. Salami. Fatback. Hotdogs. Bratwurst. Salted nuts and seeds. Canned beans with added salt. Canned or smoked fish. Whole eggs or egg yolks. Chicken or [place] with skin. Dairy Whole or 2% milk,  cream, and half-and-half. Whole or full-fat cream cheese. Whole-fat or sweetened yogurt. Full-fat cheese. Nondairy creamers. Whipped toppings. Processed cheese and cheese spreads. Fats and oils Butter. Stick margarine. Lard. Shortening. Ghee. Bacon fat. Tropical oils, such as coconut, palm kernel, or palm oil. Seasoning and other foods Salted popcorn and pretzels. Onion salt, garlic salt, seasoned salt, table salt, and sea salt. Worcestershire sauce. Tartar sauce. Barbecue sauce. Teriyaki sauce. Soy sauce, including reduced-sodium. Steak sauce. Canned and packaged gravies. Fish sauce. Oyster sauce. Cocktail sauce. Horseradish that you find on the shelf. Ketchup. Mustard. Meat flavorings and tenderizers. Bouillon cubes. Hot sauce and Tabasco sauce. Premade or packaged marinades. Premade or packaged taco seasonings. Relishes. Regular salad dressings. Where to find more information:  National Heart, Lung, and Blood Institute: https://wilson-eaton.com/  American Heart  Association: www.heart.org Summary  The DASH eating plan is a healthy eating plan that has been shown to reduce high blood pressure (hypertension). It may also reduce your risk for type 2 diabetes, heart disease, and stroke.  With the DASH eating plan, you should limit salt (sodium) intake to 2,300 mg a day. If you have hypertension, you may need to reduce your sodium intake to 1,500 mg a day.  When on the DASH eating plan, aim to eat more fresh fruits and vegetables, whole grains, lean proteins, low-fat dairy, and heart-healthy fats.  Work with your health care provider or diet and nutrition specialist (dietitian) to adjust your eating plan to your individual calorie needs. This information is not intended to replace advice given to you by your health care provider. Make sure you discuss any questions you have with your health care provider. Document Released: 08/26/2011 Document Revised: 08/30/2016 Document Reviewed: 08/30/2016 Elsevier  Interactive Patient Education  2018 [employer].   Can do salonpaus patches on your knees, can do bluEMu cream, can try rubbing CBD oil on your knees do not digest it.  If they are not better we can refer to ortho

## 2018-07-20 LAB — COMPLETE METABOLIC PANEL WITH GFR
AG RATIO: 1.4 (calc) (ref 1.0–2.5)
ALBUMIN MSPROF: 3.5 g/dL — AB (ref 3.6–5.1)
ALT: 18 U/L (ref 6–29)
AST: 22 U/L (ref 10–35)
Alkaline phosphatase (APISO): 91 U/L (ref 33–130)
BUN / CREAT RATIO: 14 (calc) (ref 6–22)
BUN: 19 mg/dL (ref 7–25)
CALCIUM: 9.2 mg/dL (ref 8.6–10.4)
CO2: 32 mmol/L (ref 20–32)
CREATININE: 1.33 mg/dL — AB (ref 0.60–0.93)
Chloride: 101 mmol/L (ref 98–110)
GFR, EST AFRICAN AMERICAN: 46 mL/min/{1.73_m2} — AB (ref >=60)
GFR, EST NON AFRICAN AMERICAN: 40 mL/min/{1.73_m2} — AB (ref >=60)
GLOBULIN: 2.5 g/dL (ref 1.9–3.7)
Glucose, Bld: 201 mg/dL — ABNORMAL HIGH (ref 65–99)
POTASSIUM: 4.4 mmol/L (ref 3.5–5.3)
SODIUM: 140 mmol/L (ref 135–146)
TOTAL PROTEIN: 6 g/dL — AB (ref 6.1–8.1)
Total Bilirubin: 0.5 mg/dL (ref 0.2–1.2)

## 2018-07-20 LAB — LIPID PANEL
CHOL/HDL RATIO: 3 (calc) (ref <5.0)
Cholesterol: 143 mg/dL (ref <200)
HDL: 47 mg/dL — ABNORMAL LOW (ref >50)
LDL Cholesterol (Calc): 76 mg/dL (calc)
NON-HDL CHOLESTEROL (CALC): 96 mg/dL (ref <130)
Triglycerides: 121 mg/dL (ref <150)

## 2018-07-20 LAB — CBC WITH DIFFERENTIAL/PLATELET
BASOS PCT: 0.9 %
Basophils Absolute: 88 cells/uL (ref 0–200)
EOS ABS: 88 {cells}/uL (ref 15–500)
Eosinophils Relative: 0.9 %
HEMATOCRIT: 49.5 % — AB (ref 35.0–45.0)
Hemoglobin: 16.5 g/dL — ABNORMAL HIGH (ref 11.7–15.5)
LYMPHS ABS: 2185 {cells}/uL (ref 850–3900)
MCH: 32.7 pg (ref 27.0–33.0)
MCHC: 33.3 g/dL (ref 32.0–36.0)
MCV: 98 fL (ref 80.0–100.0)
MPV: 11.2 fL (ref 7.5–12.5)
Monocytes Relative: 6.1 %
NEUTROS PCT: 69.8 %
Neutro Abs: 6840 cells/uL (ref 1500–7800)
PLATELETS: 246 10*3/uL (ref 140–400)
RBC: 5.05 10*6/uL (ref 3.80–5.10)
RDW: 11.6 % (ref 11.0–15.0)
TOTAL LYMPHOCYTE: 22.3 %
WBC: 9.8 10*3/uL (ref 3.8–10.8)
WBCMIX: 598 {cells}/uL (ref 200–950)

## 2018-07-20 LAB — HEMOGLOBIN A1C
EAG (MMOL/L): 12.8 (calc)
Hgb A1c MFr Bld: 9.7 % of total Hgb — ABNORMAL HIGH (ref <5.7)
Mean Plasma Glucose: 232 (calc)

## 2018-07-20 LAB — TSH: TSH: 2.47 mIU/L (ref 0.40–4.50)

## 2018-07-20 LAB — MAGNESIUM: MAGNESIUM: 1.9 mg/dL (ref 1.5–2.5)

## 2018-08-11 ENCOUNTER — Other Ambulatory Visit: Payer: Self-pay | Admitting: Physician Assistant

## 2018-08-11 DIAGNOSIS — F419 Anxiety disorder, unspecified: Secondary | ICD-10-CM

## 2018-08-18 ENCOUNTER — Other Ambulatory Visit: Payer: Self-pay | Admitting: Physician Assistant

## 2018-08-18 DIAGNOSIS — Z720 Tobacco use: Secondary | ICD-10-CM

## 2018-08-24 NOTE — Progress Notes (Signed)
Diabetes Education and Follow-Up Visit  72 y.o.female presents for diabetic education. She has Diabetes Mellitus type 2:  with diabetic chronic kidney disease and with other circulatory complications, she is on bASA, and denies polydipsia and polyuria. She was given wellbutrin last visit as well to try to aid in smoking cessation.  She was seen last visit and told to keep a food and sugar diary- she did not bring this and has not done it.  She was also sent in wellbutrin last visit- states she took it for a week but stoppped due to not feeling well. She states she has been sick x 3 weeks, her daughter and grandson had it. She has had diarrhea x 3 days, started new job that she loves. She has had decreased appetite. She has had cough, sinus congestion/runny nose, started on allegra.  Denies CP, SOB.  Denies fever, chills.  She has follow up Jan with vascular, complains of leg cramping at night and leg swelling.   Last hemoglobin A1c was: Lab Results  Component Value Date   HGBA1C 9.7 (H) 07/19/2018   HGBA1C 8.7 (H) 04/18/2018   HGBA1C 8.3 (H) 01/03/2018    Body mass index is 30.51 kg/m. Wt Readings from Last 5 Encounters:  08/28/18 189 lb (85.7 kg)  07/19/18 189 lb (85.7 kg)  06/13/18 187 lb (84.8 kg)  04/18/18 187 lb 6.4 oz (85 kg)  03/27/18 186 lb (84.4 kg)   Pt is on a regimen of: combination: novolin 70/30 25 and 20---> she switched to 30 units in the AM and 25 at night. She has had a few morning that it has been 96, most of the time 120's in the AM.  Pt checks her sugars 2 x day   She is working 10-2, she will eat 1/2 at 10 and 1/2 at lunch with an apple.   Exercise: she does not exercise.   Patient does have CKD She is on ACE/ARB  Lab Results  Component Value Date   GFRAA 46 (L) 07/19/2018    Lab Results  Component Value Date   CREATININE 1.33 (H) 07/19/2018   BUN 19 07/19/2018   NA 140 07/19/2018   K 4.4 07/19/2018   CL 101 07/19/2018   CO2 32 07/19/2018     Lab Results  Component Value Date   MICROALBUR 387.5 04/18/2018    She is on a Statin.  She is not at goal of less than 70.  Lab Results  Component Value Date   CHOL 143 07/19/2018   HDL 47 (L) 07/19/2018   LDLCALC 76 07/19/2018   LDLDIRECT 146.4 01/30/2010   TRIG 121 07/19/2018   CHOLHDL 3.0 07/19/2018     Problem List has Nontoxic multinodular goiter; CKD stage 3 due to type 2 diabetes mellitus ([place]); Glaucoma; Essential hypertension; Brain aneurysm; ESOPHAGEAL STRICTURE; Esophageal reflux; Diverticulosis of large intestine; History of colonic polyps; Atherosclerosis of native arteries of extremity with intermittent claudication; Peripheral vascular disease due to secondary diabetes mellitus ([place]); Vitamin D deficiency; Medication management; History of CVA (cerebrovascular accident); Tobacco abuse; Generalized anxiety disorder; Depression, major, recurrent, in partial remission ([place]); Encounter for Medicare annual wellness exam; Hyperlipidemia, mixed; Insulin-requiring or dependent type II diabetes mellitus ([place]); PVD (peripheral vascular disease) ([place]); Emphysema of lung ([place]); Atherosclerosis of aorta ([place]); and Lung nodule on their problem list.  Medications  Current Outpatient Medications (Endocrine & Metabolic):  .  insulin NPH-regular Human (NOVOLIN 70/30) (70-30) 100 UNIT/ML injection, INJECT 25 UNITS IN THE AM [place]  WITH FOOD, AND INJECT 25 UNITS [place] WITH EVENING MEAL. (Patient taking differently: Inject 20-25 Units into the skin See admin instructions. INJECT 25 UNITS IN THE AM [place] WITH FOOD, AND INJECT 20 UNITS  WITH EVENING MEAL.)  Current Outpatient Medications (Cardiovascular):  .  amLODipine (NORVASC) 10 MG tablet, TAKE 1 TABLET BY MOUTH EVERY DAY (Patient taking differently: Take 10 mg by mouth daily. ) .  bisoprolol-hydrochlorothiazide (ZIAC) 5-6.25 MG tablet, 1/2-1 TABLET DAILY .  olmesartan (BENICAR) 40 MG tablet, Take 1 tablet daily for BP .  rosuvastatin  (CRESTOR) 40 MG tablet, Take 1/2 to 1 tablet daily or as directed for Cholesterol (Patient taking differently: Take 20 mg by mouth at bedtime. Take 1/2 to 1 tablet daily or as directed for Cholesterol)   Current Outpatient Medications (Analgesics):  .  aspirin EC 81 MG tablet, Take 81 mg by mouth at bedtime.    Current Outpatient Medications (Other):  .  Blood Glucose Monitoring Suppl (ACCU-CHEK AVIVA PLUS) W/DEVICE KIT, Check blood sugar up to three times daily .  buPROPion (WELLBUTRIN XL) 150 MG 24 hr tablet, TAKE 1 TABLET BY MOUTH EVERY DAY IN THE MORNING .  Cholecalciferol (VITAMIN D PO), Take 5,000 Units by mouth daily. Marland Kitchen  CINNAMON PO, Take 1,000 mg by mouth 2 (two) times daily.  .  citalopram (CELEXA) 40 MG tablet, TAKE 1 TABLET DAILY FOR MOOD & ANXIETY .  clonazePAM (KLONOPIN) 0.5 MG tablet, PLEASE SEE ATTACHED FOR DETAILED DIRECTIONS (Patient taking differently: Take 0.5 mg by mouth at bedtime. PLEASE SEE ATTACHED FOR DETAILED DIRECTIONS) .  glucose blood (ACCU-CHEK AVIVA PLUS) test strip, Use daily to check BS TID Dx. E11.22 .  Insulin Syringe-Needle U-100 31G X 15/64" 0.5 ML MISC, Use two pens daily with insulin .  Magnesium 500 MG TABS, Take 500 mg by mouth daily. .  Omega-3 Fatty Acids (FISH OIL) 1200 MG CAPS, Take 1,200 capsules by mouth daily.  Marland Kitchen  omeprazole (PRILOSEC) 40 MG capsule, Take 1 capsule (40 mg total) by mouth daily. Marland Kitchen  OVER THE COUNTER MEDICATION, Take 1 tablet by mouth daily. One a day vitamin Woman's one a day .  vitamin E 400 UNIT capsule, Take 400 Units by mouth daily.  ROS- see HPI  Physical Exam: Blood pressure 136/74, pulse 67, temperature (!) 97.4 F (36.3 C), height 5' 6"  (1.676 m), weight 189 lb (85.7 kg), SpO2 98 %. Body mass index is 30.51 kg/m. General Appearance: Well nourished, in no apparent distress. Eyes: PERRLA, EOMs, conjunctiva no swelling or erythema ENT/Mouth: Ext aud canals clear, TMs without erythema, bulging. No erythema, swelling, or  exudate on post pharynx.  Tonsils not swollen or erythematous. Hearing normal.  Respiratory: Respiratory effort normal, BS equal bilaterally with anterior crackles without rales, rhonchi, wheezing or stridor.  Cardio: RRR with no MRGs. Decreased peripheral pulses without edema.  Abdomen: Soft, + BS.  + epigastric and LLQ tenderness, no guarding, rebound, hernias, masses. Musculoskeletal: Full ROM, 5/5 strength, normal gait.  Skin: Warm, dry without rashes, lesions, ecchymosis.  Neuro: Cranial nerves intact. Normal muscle tone, no cerebellar symptoms. Sensation intact.    Plan and Assessment: Diabetes Education: Reviewed 'ABCs' of diabetes management (respective goals in parentheses):  A1C (<7), blood pressure (<130/80), and cholesterol (LDL <70) Eye Exam yearly and Dental Exam every 6 months. Dietary recommendations Physical Activity recommendations - Strongly advised her to start checking sugars at different times of the day - check 2 times a day, rotating checks - given sugar log  and advised how to fill it and to bring it at next appt  - given foot care handout and explained the principles  - given instructions for hypoglycemia management   Fatigue with diarrhea, LLQ pain, and epigastric pain - check labs including pancreatitic labs - allergic to cipro/penicillin - will send in doxy in need an antibiotic CXR 06/06/2018   The patient was advised to call immediately if she has any concerning symptoms in the interval. The patient voices understanding of current treatment options and is in agreement with the current care plan.The patient knows to call the clinic with any problems, questions or concerns or go to the ER if any further progression of symptoms.    Future Appointments  Date Time Provider [place]  09/05/2018  3:45 PM Marzetta Board, DPM [facility]  10/02/2018 11:00 AM [facility] - EM [facility]  10/02/2018 12:00 PM [facility] - EM [facility]  [facility]  10/02/2018 12:15 PM Nickel, Sharmon Leyden, NP [facility]  11/13/2018 10:30 AM [name], PA-C [facility] None  05/14/2019 10:00 AM [name], MD [facility] None

## 2018-08-28 ENCOUNTER — Ambulatory Visit (HOSPITAL_COMMUNITY)
Admission: RE | Admit: 2018-08-28 | Discharge: 2018-08-28 | Disposition: A | Discharge status: Home / Self Care | Payer: Medicare Other | Source: Ambulatory Visit | Attending: Physician Assistant | Admitting: Physician Assistant

## 2018-08-28 ENCOUNTER — Encounter: Payer: Self-pay | Admitting: Physician Assistant

## 2018-08-28 ENCOUNTER — Ambulatory Visit (INDEPENDENT_AMBULATORY_CARE_PROVIDER_SITE_OTHER): Payer: Medicare Other | Admitting: Physician Assistant

## 2018-08-28 VITALS — BP 136/74 | HR 67 | Temp 97.4°F | Ht 66.0 in | Wt 189.0 lb

## 2018-08-28 DIAGNOSIS — N183 Chronic kidney disease, stage 3 unspecified: Secondary | ICD-10-CM

## 2018-08-28 DIAGNOSIS — R059 Cough, unspecified: Secondary | ICD-10-CM

## 2018-08-28 DIAGNOSIS — I779 Disorder of arteries and arterioles, unspecified: Secondary | ICD-10-CM | POA: Diagnosis not present

## 2018-08-28 DIAGNOSIS — E1122 Type 2 diabetes mellitus with diabetic chronic kidney disease: Secondary | ICD-10-CM

## 2018-08-28 DIAGNOSIS — R1084 Generalized abdominal pain: Secondary | ICD-10-CM | POA: Diagnosis not present

## 2018-08-28 DIAGNOSIS — R05 Cough: Secondary | ICD-10-CM | POA: Insufficient documentation

## 2018-08-28 DIAGNOSIS — E1351 Other specified diabetes mellitus with diabetic peripheral angiopathy without gangrene: Secondary | ICD-10-CM | POA: Diagnosis not present

## 2018-08-28 DIAGNOSIS — Z794 Long term (current) use of insulin: Secondary | ICD-10-CM | POA: Diagnosis not present

## 2018-08-28 NOTE — Patient Instructions (Signed)
INFORMATION ABOUT YOUR XRAY  [facility] [place] go to radiology and give them your name. They will have the order and take you back. You do not any paper work, I should get the result back today or tomorrow. This order is good for a year.    Fatigue Fatigue is feeling tired all of the time, a lack of energy, or a lack of motivation. Occasional or mild fatigue is often a normal response to activity or life in general. However, long-lasting (chronic) or extreme fatigue may indicate an underlying medical condition. Follow these instructions at home: Watch your fatigue for any changes. The following actions may help to lessen any discomfort you are feeling:  Talk to your health care provider about how much sleep you need each night. Try to get the required amount every night.  Take medicines only as directed by your health care provider.  Eat a healthy and nutritious diet. Ask your health care provider if you need help changing your diet.  Drink enough fluid to keep your urine clear or pale yellow.  Practice ways of relaxing, such as yoga, meditation, massage therapy, or acupuncture.  Exercise regularly.  Change situations that cause you stress. Try to keep your work and personal routine reasonable.  Do not abuse illegal drugs.  Limit alcohol intake to no more than 1 drink per day for nonpregnant women and 2 drinks per day for men. One drink equals 12 ounces of beer, 5 ounces of wine, or 1 ounces of hard liquor.  Take a multivitamin, if directed by your health care provider.  Contact a health care provider if:  Your fatigue does not get better.  You have a fever.  You have unintentional weight loss or gain.  You have headaches.  You have difficulty: ? Falling asleep. ? Sleeping throughout the night.  You feel angry, guilty, anxious, or sad.  You are unable to have a bowel movement (constipation).  You skin is dry.  Your legs or another part of your body  is swollen. Get help right away if:  You feel confused.  Your vision is blurry.  You feel faint or pass out.  You have a severe headache.  You have severe abdominal, pelvic, or back pain.  You have chest pain, shortness of breath, or an irregular or fast heartbeat.  You are unable to urinate or you urinate less than normal.  You develop abnormal bleeding, such as bleeding from the rectum, vagina, nose, lungs, or nipples.  You vomit blood.  You have thoughts about harming yourself or committing suicide.  You are worried that you might harm someone else. This information is not intended to replace advice given to you by your health care provider. Make sure you discuss any questions you have with your health care provider. Document Released: 07/04/2007 Document Revised: 02/12/2016 Document Reviewed: 01/08/2014 Elsevier Interactive Patient Education  [employer].

## 2018-08-29 LAB — COMPLETE METABOLIC PANEL WITH GFR
AG Ratio: 1.2 (calc) (ref 1.0–2.5)
ALKALINE PHOSPHATASE (APISO): 97 U/L (ref 33–130)
ALT: 20 U/L (ref 6–29)
AST: 21 U/L (ref 10–35)
Albumin: 3.4 g/dL — ABNORMAL LOW (ref 3.6–5.1)
BILIRUBIN TOTAL: 0.5 mg/dL (ref 0.2–1.2)
BUN / CREAT RATIO: 12 (calc) (ref 6–22)
BUN: 17 mg/dL (ref 7–25)
CHLORIDE: 104 mmol/L (ref 98–110)
CO2: 33 mmol/L — ABNORMAL HIGH (ref 20–32)
Calcium: 9.3 mg/dL (ref 8.6–10.4)
Creat: 1.44 mg/dL — ABNORMAL HIGH (ref 0.60–0.93)
GFR, Est African American: 42 mL/min/{1.73_m2} — ABNORMAL LOW (ref >=60)
GFR, Est Non African American: 36 mL/min/{1.73_m2} — ABNORMAL LOW (ref >=60)
GLUCOSE: 219 mg/dL — AB (ref 65–99)
Globulin: 2.9 g/dL (calc) (ref 1.9–3.7)
Potassium: 4.9 mmol/L (ref 3.5–5.3)
Sodium: 141 mmol/L (ref 135–146)
Total Protein: 6.3 g/dL (ref 6.1–8.1)

## 2018-08-29 LAB — AMYLASE: AMYLASE: 51 U/L (ref 21–101)

## 2018-08-29 LAB — CBC WITH DIFFERENTIAL/PLATELET
BASOS ABS: 106 {cells}/uL (ref 0–200)
Basophils Relative: 1 %
EOS ABS: 127 {cells}/uL (ref 15–500)
Eosinophils Relative: 1.2 %
HCT: 48.6 % — ABNORMAL HIGH (ref 35.0–45.0)
Hemoglobin: 16 g/dL — ABNORMAL HIGH (ref 11.7–15.5)
Lymphs Abs: 2989 cells/uL (ref 850–3900)
MCH: 32.1 pg (ref 27.0–33.0)
MCHC: 32.9 g/dL (ref 32.0–36.0)
MCV: 97.6 fL (ref 80.0–100.0)
MONOS PCT: 6.3 %
MPV: 11.5 fL (ref 7.5–12.5)
Neutro Abs: 6710 cells/uL (ref 1500–7800)
Neutrophils Relative %: 63.3 %
PLATELETS: 262 10*3/uL (ref 140–400)
RBC: 4.98 10*6/uL (ref 3.80–5.10)
RDW: 11.6 % (ref 11.0–15.0)
TOTAL LYMPHOCYTE: 28.2 %
WBC mixed population: 668 cells/uL (ref 200–950)
WBC: 10.6 10*3/uL (ref 3.8–10.8)

## 2018-08-29 LAB — TSH: TSH: 2.15 mIU/L (ref 0.40–4.50)

## 2018-09-04 ENCOUNTER — Other Ambulatory Visit: Payer: Self-pay | Admitting: Physician Assistant

## 2018-09-05 ENCOUNTER — Encounter: Payer: Self-pay | Admitting: Podiatry

## 2018-09-05 ENCOUNTER — Ambulatory Visit (INDEPENDENT_AMBULATORY_CARE_PROVIDER_SITE_OTHER): Payer: Medicare Other | Admitting: Podiatry

## 2018-09-05 DIAGNOSIS — B351 Tinea unguium: Secondary | ICD-10-CM

## 2018-09-05 DIAGNOSIS — M79676 Pain in unspecified toe(s): Secondary | ICD-10-CM | POA: Diagnosis not present

## 2018-09-05 DIAGNOSIS — E1151 Type 2 diabetes mellitus with diabetic peripheral angiopathy without gangrene: Secondary | ICD-10-CM | POA: Diagnosis not present

## 2018-09-05 DIAGNOSIS — L84 Corns and callosities: Secondary | ICD-10-CM | POA: Diagnosis not present

## 2018-09-05 NOTE — Patient Instructions (Addendum)
Onychomycosis/Fungal Toenails  WHAT IS IT? An infection that lies within the keratin of your nail plate that is caused by a fungus.  WHY ME? Fungal infections affect all ages, sexes, races, and creeds.  There may be many factors that predispose you to a fungal infection such as age, coexisting medical conditions such as diabetes, or an autoimmune disease; stress, medications, fatigue, genetics, etc.  Bottom line: fungus thrives in a warm, moist environment and your shoes offer such a location.  IS IT CONTAGIOUS? Theoretically, yes.  You do not want to share shoes, nail clippers or files with someone who has fungal toenails.  Walking around barefoot in the same room or sleeping in the same bed is unlikely to transfer the organism.  It is important to realize, however, that fungus can spread easily from one nail to the next on the same foot.  HOW DO WE TREAT THIS?  There are several ways to treat this condition.  Treatment may depend on many factors such as age, medications, pregnancy, liver and kidney conditions, etc.  It is best to ask your doctor which options are available to you.  1. No treatment.   Unlike many other medical concerns, you can live with this condition.  However for many people this can be a painful condition and may lead to ingrown toenails or a bacterial infection.  It is recommended that you keep the nails cut short to help reduce the amount of fungal nail. 2. Topical treatment.  These range from herbal remedies to prescription strength nail lacquers.  About 40-50% effective, topicals require twice daily application for approximately 9 to 12 months or until an entirely new nail has grown out.  The most effective topicals are medical grade medications available through physicians offices. 3. Oral antifungal medications.  With an 80-90% cure rate, the most common oral medication requires 3 to 4 months of therapy and stays in your system for a year as the new nail grows out.  Oral  antifungal medications do require blood work to make sure it is a safe drug for you.  A liver function panel will be performed prior to starting the medication and after the first month of treatment.  It is important to have the blood work performed to avoid any harmful side effects.  In general, this medication safe but blood work is required. 4. Laser Therapy.  This treatment is performed by applying a specialized laser to the affected nail plate.  This therapy is noninvasive, fast, and non-painful.  It is not covered by insurance and is therefore, out of pocket.  The results have been very good with a 80-95% cure rate.  The [facility] is the only practice in the area to offer this therapy. 5. Permanent Nail Avulsion.  Removing the entire nail so that a new nail will not grow back.  Diabetes and Foot Care Diabetes may cause you to have problems because of poor blood supply (circulation) to your feet and legs. This may cause the skin on your feet to become thinner, break easier, and heal more slowly. Your skin may become dry, and the skin may peel and crack. You may also have nerve damage in your legs and feet causing decreased feeling in them. You may not notice minor injuries to your feet that could lead to infections or more serious problems. Taking care of your feet is one of the most important things you can do for yourself. Follow these instructions at home:  Wear   shoes at all times, even in the house. Do not go barefoot. Bare feet are easily injured.  Check your feet daily for blisters, cuts, and redness. If you cannot see the bottom of your feet, use a mirror or ask someone for help.  Wash your feet with warm water (do not use hot water) and mild soap. Then pat your feet and the areas between your toes until they are completely dry. Do not soak your feet as this can dry your skin.  Apply a moisturizing lotion or petroleum jelly (that does not contain alcohol and is unscented) to the  skin on your feet and to dry, brittle toenails. Do not apply lotion between your toes.  Trim your toenails straight across. Do not dig under them or around the cuticle. File the edges of your nails with an emery board or nail file.  Do not cut corns or calluses or try to remove them with medicine.  Wear clean socks or stockings every day. Make sure they are not too tight. Do not wear knee-high stockings since they may decrease blood flow to your legs.  Wear shoes that fit properly and have enough cushioning. To break in new shoes, wear them for just a few hours a day. This prevents you from injuring your feet. Always look in your shoes before you put them on to be sure there are no objects inside.  Do not cross your legs. This may decrease the blood flow to your feet.  If you find a minor scrape, cut, or break in the skin on your feet, keep it and the skin around it clean and dry. These areas may be cleansed with mild soap and water. Do not cleanse the area with peroxide, alcohol, or iodine.  When you remove an adhesive bandage, be sure not to damage the skin around it.  If you have a wound, look at it several times a day to make sure it is healing.  Do not use heating pads or hot water bottles. They may burn your skin. If you have lost feeling in your feet or legs, you may not know it is happening until it is too late.  Make sure your health care provider performs a complete foot exam at least annually or more often if you have foot problems. Report any cuts, sores, or bruises to your health care provider immediately. Contact a health care provider if:  You have an injury that is not healing.  You have cuts or breaks in the skin.  You have an ingrown nail.  You notice redness on your legs or feet.  You feel burning or tingling in your legs or feet.  You have pain or cramps in your legs and feet.  Your legs or feet are numb.  Your feet always feel cold. Get help right away  if:  There is increasing redness, swelling, or pain in or around a wound.  There is a red line that goes up your leg.  Pus is coming from a wound.  You develop a fever or as directed by your health care provider.  You notice a bad smell coming from an ulcer or wound. This information is not intended to replace advice given to you by your health care provider. Make sure you discuss any questions you have with your health care provider. Document Released: 09/03/2000 Document Revised: 02/12/2016 Document Reviewed: 02/13/2013 Elsevier Interactive Patient Education  2017 Elsevier Inc.  

## 2018-09-05 NOTE — Progress Notes (Signed)
Subjective: [name] is a 72 y.o. y.o. female who presents for preventative foot care today with diabetes, diabetic neuropathy and cc of painful, discolored, thick toenails and painful callus plantar left foot which interferes with daily activities. Pain is aggravated when wearing enclosed shoe gear. Pain is relieved with periodic professional debridement.  [name], MD is her PCP and last visit was 04/18/2018.    Current Outpatient Medications:  .  amLODipine (NORVASC) 10 MG tablet, TAKE 1 TABLET BY MOUTH EVERY [name] (Patient taking differently: Take 10 mg by mouth daily. ), Disp: 90 tablet, Rfl: 1 .  aspirin EC 81 MG tablet, Take 81 mg by mouth at bedtime. , Disp: , Rfl:  .  bisoprolol-hydrochlorothiazide (ZIAC) 5-6.25 MG tablet, TAKE 1/2-1 TABLET BY MOUTH DAILY, Disp: 90 tablet, Rfl: 0 .  Blood Glucose Monitoring Suppl (ACCU-CHEK AVIVA PLUS) W/DEVICE KIT, Check blood sugar up to three times daily, Disp: 1 kit, Rfl: 0 .  buPROPion (WELLBUTRIN XL) 150 MG 24 hr tablet, TAKE 1 TABLET BY MOUTH EVERY [name] IN THE MORNING, Disp: 90 tablet, Rfl: 1 .  Cholecalciferol (VITAMIN D PO), Take 5,000 Units by mouth daily., Disp: , Rfl:  .  CINNAMON PO, Take 1,000 mg by mouth 2 (two) times daily. , Disp: , Rfl:  .  citalopram (CELEXA) 40 MG tablet, TAKE 1 TABLET DAILY FOR MOOD & ANXIETY, Disp: 90 tablet, Rfl: 1 .  clonazePAM (KLONOPIN) 0.5 MG tablet, PLEASE SEE ATTACHED FOR DETAILED DIRECTIONS (Patient taking differently: Take 0.5 mg by mouth at bedtime. PLEASE SEE ATTACHED FOR DETAILED DIRECTIONS), Disp: 60 tablet, Rfl: 1 .  glucose blood (ACCU-CHEK AVIVA PLUS) test strip, Use daily to check BS TID Dx. E11.22, Disp: 300 each, Rfl: 1 .  insulin NPH-regular Human (NOVOLIN 70/30) (70-30) 100 UNIT/ML injection, INJECT 25 UNITS IN THE AM [place] WITH FOOD, AND INJECT 25 UNITS [place] WITH EVENING MEAL. (Patient taking differently: Inject 20-25 Units into the skin See admin instructions. INJECT 25 UNITS IN THE AM [place]  WITH FOOD, AND INJECT 20 UNITS [place] WITH EVENING MEAL.), Disp: 60 mL, Rfl: 1 .  Insulin Syringe-Needle U-100 31G X 15/64" 0.5 ML MISC, Use two pens daily with insulin, Disp: 100 each, Rfl: 3 .  Magnesium 500 MG TABS, Take 500 mg by mouth daily., Disp: , Rfl:  .  olmesartan (BENICAR) 40 MG tablet, Take 1 tablet daily for BP, Disp: 90 tablet, Rfl: 1 .  Omega-3 Fatty Acids (FISH OIL) 1200 MG CAPS, Take 1,200 capsules by mouth daily. , Disp: , Rfl:  .  omeprazole (PRILOSEC) 40 MG capsule, Take 1 capsule (40 mg total) by mouth daily., Disp: 90 capsule, Rfl: 2 .  OVER THE COUNTER MEDICATION, Take 1 tablet by mouth daily. One a [name] vitamin Woman's one a [name], Disp: , Rfl:  .  rosuvastatin (CRESTOR) 40 MG tablet, Take 1/2 to 1 tablet daily or as directed for Cholesterol (Patient taking differently: Take 20 mg by mouth at bedtime. Take 1/2 to 1 tablet daily or as directed for Cholesterol), Disp: 90 tablet, Rfl: 1 .  vitamin E 400 UNIT capsule, Take 400 Units by mouth daily., Disp: , Rfl:   Allergies  Allergen Reactions  . Ciprofloxacin Other (See Comments)  . Plavix [Clopidogrel Bisulfate] Palpitations  . Amoxicillin Itching and Swelling    FACE & EYES SWELL  . Ace Inhibitors   . Ciprofloxacin Hcl Swelling  . Lyrica [Pregabalin]     Itch, gain weight  . Penicillins Other (See  Comments)    REACTION: unspecified   Objective:  Vascular Examination: Capillary refill time <3 seconds x 10 digits Dorsalis pedis pulses absent b/l Posterior tibial pulses absent b/l No ischemia/gangrene noted b/l feet No digital hair x 10 digits Skin temperature warm to cool b/l  Dermatological Examination: Skin thin, shiny and atrophic b/l  Toenails 1-5 b/l discolored, thick, dystrophic with subungual debris and pain with palpation to nailbeds due to thickness of nails. No clinical signs of bacterial infection noted.  Right 5th digit with hyperkeratosis lateral nail border consistent with Lister's  corn  Hyperkeratotic lesion submetatarsal head 3 left foot. No flocculence, no perilesional erythema, no edema, no subdermal abscess, no underlying ulceration.  Musculoskeletal: Muscle strength 5/5 to all LE muscle groups  Neurological: Sensation intact with 10 gram monofilament b/l Vibratory sensation intact b/l  Assessment: 1. Painful onychomycosis toenails 1-5 b/l 2. 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 NIDDM with PAD  Plan: 1. Toenails 1-5 b/l were debrided in length and girth without iatrogenic bleeding. 2. Callus(es) pared: submetatarsal head 3 left foot. Discussed metatarsal padding for this area on today. 3. Lister's corn pared: right 5th digit 4. Patient to continue soft, supportive shoe gear 5. Patient to report any pedal injuries to medical professional  6. Follow up 3 months.  7. Patient/POA to call should there be a concern in the interim.

## 2018-10-02 ENCOUNTER — Ambulatory Visit (INDEPENDENT_AMBULATORY_CARE_PROVIDER_SITE_OTHER): Payer: Medicare Other | Admitting: Family

## 2018-10-02 ENCOUNTER — Encounter: Payer: Self-pay | Admitting: Family

## 2018-10-02 ENCOUNTER — Ambulatory Visit (INDEPENDENT_AMBULATORY_CARE_PROVIDER_SITE_OTHER)
Admission: RE | Admit: 2018-10-02 | Discharge: 2018-10-02 | Disposition: A | Discharge status: Home / Self Care | Payer: Medicare Other | Source: Ambulatory Visit | Attending: Internal Medicine | Admitting: Internal Medicine

## 2018-10-02 ENCOUNTER — Other Ambulatory Visit: Payer: Self-pay

## 2018-10-02 ENCOUNTER — Ambulatory Visit (HOSPITAL_COMMUNITY)
Admission: RE | Admit: 2018-10-02 | Discharge: 2018-10-02 | Disposition: A | Discharge status: Home / Self Care | Payer: Medicare Other | Source: Ambulatory Visit | Attending: Internal Medicine | Admitting: Internal Medicine

## 2018-10-02 VITALS — BP 153/67 | HR 56 | Temp 97.8°F | Resp 20 | Ht 66.0 in | Wt 189.0 lb

## 2018-10-02 DIAGNOSIS — I779 Disorder of arteries and arterioles, unspecified: Secondary | ICD-10-CM

## 2018-10-02 DIAGNOSIS — F17200 Nicotine dependence, unspecified, uncomplicated: Secondary | ICD-10-CM | POA: Diagnosis not present

## 2018-10-02 DIAGNOSIS — IMO0002 Reserved for concepts with insufficient information to code with codable children: Secondary | ICD-10-CM

## 2018-10-02 DIAGNOSIS — R0989 Other specified symptoms and signs involving the circulatory and respiratory systems: Secondary | ICD-10-CM

## 2018-10-02 DIAGNOSIS — Z959 Presence of cardiac and vascular implant and graft, unspecified: Secondary | ICD-10-CM | POA: Diagnosis not present

## 2018-10-02 DIAGNOSIS — E1151 Type 2 diabetes mellitus with diabetic peripheral angiopathy without gangrene: Secondary | ICD-10-CM

## 2018-10-02 DIAGNOSIS — E1165 Type 2 diabetes mellitus with hyperglycemia: Secondary | ICD-10-CM

## 2018-10-02 NOTE — Progress Notes (Signed)
[facility] [place] HISTORY AND PHYSICAL   CC: Follow up extracranial carotid artery stenosis and peripheral artery occlusive disease    History of Present Illness:   [name] is a 73 y.o. female who is s/p stenting of her left superficial femoral artery in September of 2012by Dr. Trula Slade. This alleviated her symptoms initially. 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   She has a known cerebral aneurysm followed by her neurologist, Dr. Hollice Gong is also following her carotid arteries, has had several TIA's.   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 MRAshowed mod stenosis of the Lt posterior cerebral Aa. 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 Carotid dopplers &2D echocardiogram had no significant abnormalities. During hospitalization, BP's were mildly elevated and permissively monitored. Patient remained stable, improved and was d/c'd on 01/27/2017 for outpatient f/u. Patient was advised to continue ASA 325 mg daily & saw Dr Catalina Gravel on 01/18/2017 and he advised her to switch to Aggrenox and further advised that she f/u with a traditional Neurologist as advised at hospital discharge (Dr Lavera Guise). She has residual right arm numbness. She had some physical therapy.  She was hospitalized January 2016 at [facility] then [facility] with loss of balance, states she was told that she did not have a stroke, was told that she was dehydrated. She was evaluated for abdominal pain in [facility] ED in November 2018, treated for lower UTI. She states the Cipro prescribed for this caused right facial swelling.  Her feet ache and feel cold all  of the time, she sees a podiatrist.   Her knees are painful when she walks; this limits her walking before claudication does.  Dr. Trula Slade stopped the Pletal since it did not help her claudication symptoms.   She has a swallowing problem with nausea and abdominal pain "for a long time". She was referred to GI.  She states her insurance will not pay for [facility] which was advised by Dr. Dwyane Dee.  She states that she has stage 3 CKD.    Diabetic: Yes, 9.7 A1C on 07-19-18 (review of records) Tobacco use: smoker  (1/3 ppd, started at age 45 yrs)  Pt meds include: Statin :Yes Betablocker: Yes ASA: Yes Other anticoagulants/antiplatelets: no  Current Outpatient Medications  Medication Sig Dispense Refill  . amLODipine (NORVASC) 10 MG tablet TAKE 1 TABLET BY MOUTH EVERY DAY (Patient taking differently: Take 10 mg by mouth daily. ) 90 tablet 1  . aspirin EC 81 MG tablet Take 81 mg by mouth at bedtime.     . bisoprolol-hydrochlorothiazide (ZIAC) 5-6.25 MG tablet TAKE 1/2-1 TABLET BY MOUTH DAILY 90 tablet 0  . Blood Glucose Monitoring Suppl (ACCU-CHEK AVIVA PLUS) W/DEVICE KIT Check blood sugar up to three times daily 1 kit 0  . buPROPion (WELLBUTRIN XL) 150 MG 24 hr tablet TAKE 1 TABLET BY MOUTH EVERY DAY IN THE MORNING 90 tablet 1  . Cholecalciferol (VITAMIN D PO) Take 5,000 Units by mouth daily.    Marland Kitchen CINNAMON PO Take 1,000 mg by mouth 2 (two) times daily.     . citalopram (CELEXA) 40 MG tablet TAKE 1 TABLET DAILY FOR MOOD & ANXIETY 90 tablet 1  .  clonazePAM (KLONOPIN) 0.5 MG tablet PLEASE SEE ATTACHED FOR DETAILED DIRECTIONS (Patient taking differently: Take 0.5 mg by mouth at bedtime. PLEASE SEE ATTACHED FOR DETAILED DIRECTIONS) 60 tablet 1  . glucose blood (ACCU-CHEK AVIVA PLUS) test strip Use daily to check BS TID Dx. E11.22 300 each 1  . insulin NPH-regular Human (NOVOLIN 70/30) (70-30) 100 UNIT/ML injection INJECT 25 UNITS IN THE AM [place] WITH FOOD, AND INJECT 25 UNITS [place] WITH EVENING  MEAL. (Patient taking differently: Inject 20-25 Units into the skin See admin instructions. INJECT 25 UNITS IN THE AM [place] WITH FOOD, AND INJECT 20 UNITS [place] WITH EVENING MEAL.) 60 mL 1  . Insulin Syringe-Needle U-100 31G X 15/64" 0.5 ML MISC Use two pens daily with insulin 100 each 3  . Magnesium 500 MG TABS Take 500 mg by mouth daily.    Marland Kitchen olmesartan (BENICAR) 40 MG tablet Take 1 tablet daily for BP 90 tablet 1  . Omega-3 Fatty Acids (FISH OIL) 1200 MG CAPS Take 1,200 capsules by mouth daily.     Marland Kitchen omeprazole (PRILOSEC) 40 MG capsule Take 1 capsule (40 mg total) by mouth daily. 90 capsule 2  . OVER THE COUNTER MEDICATION Take 1 tablet by mouth daily. One a day vitamin Woman's one a day    . rosuvastatin (CRESTOR) 40 MG tablet Take 1/2 to 1 tablet daily or as directed for Cholesterol (Patient taking differently: Take 20 mg by mouth at bedtime. Take 1/2 to 1 tablet daily or as directed for Cholesterol) 90 tablet 1  . vitamin E 400 UNIT capsule Take 400 Units by mouth daily.     No current facility-administered medications for this visit.     Past Medical History:  Diagnosis Date  . Abnormal findings on esophagogastroduodenoscopy (EGD) 07/2010  . Aneurysm [facility]) 2013   Right Brain   . Aneurysm ([place])    in brain x 2  . Anxiety   . Colon polyps   . Diabetes mellitus 1998  . Diverticulosis   . GERD (gastroesophageal reflux disease)   . Hemorrhoids   . Hiatal hernia   . Hyperlipidemia   . Hypertension 2000  . Migraines   . Peripheral vascular disease ([place])   . Phlebitis    30 years ago  left leg  . Status post colonoscopy 07/2010  . Stroke [facility])    multiple mini strokes ( brain aneurysm )    Social History Social History   Tobacco Use  . Smoking status: Current Every Day Smoker    Packs/day: 0.25    Years: 40.00    Pack years: 10.00    Types: Cigarettes  . Smokeless tobacco: Never Used  Substance Use Topics  . Alcohol use: No    Alcohol/week: 0.0 standard drinks  . Drug use:  No    Family History Family History  Problem Relation Age of Onset  . CAD Mother 68       Died of MI  . Hypertension Mother   . Heart attack Mother   . Heart disease Mother   . CAD Father 68       Died of MI  . Heart disease Father   . Heart attack Father   . CAD Brother 27       Two brothers died of MI  . Heart attack Brother   . Heart disease Brother        Amputation  . Diabetes Sister   . Hypertension Sister   . Heart attack Brother   .  Heart disease Brother   . Colon cancer Neg Hx   . Stomach cancer Neg Hx   . Esophageal cancer Neg Hx     Surgical History Past Surgical History:  Procedure Laterality Date  . ABDOMINAL AORTAGRAM N/A 01/04/2012   Procedure: ABDOMINAL Maxcine Ham;  Surgeon: Serafina Mitchell, MD;  Location: [facility] C[facility] LAB;  Service: Cardiovascular;  Laterality: N/A;  . ABDOMINAL AORTAGRAM N/A 08/15/2012   Procedure: ABDOMINAL Maxcine Ham;  Surgeon: Serafina Mitchell, MD;  Location: [facility] CATH LAB;  Service: Cardiovascular;  Laterality: N/A;  . ABDOMINAL HYSTERECTOMY  1984  . cataract surgery Right 10-22-2015  . cataract surgery Left 11-05-2015  . CESAREAN SECTION    . CHOLECYSTECTOMY    . COLONOSCOPY  07/2010  . DENTAL SURGERY  Aug. 16, 2013   left lower   . ESOPHAGOGASTRODUODENOSCOPY  07/2010  . EYE SURGERY  Nov. 2014   Laser-Glaucoma  . FEMORAL ARTERY STENT  05/11/11   Left superficial femoral and popliteal artery  . FEMORAL-POPLITEAL BYPASS GRAFT  Nov. 26, 2013   Left Angiogram-   . HERNIA REPAIR     times two  . KNEE SURGERY    . LOWER EXTREMITY ANGIOGRAM Left 08/15/2012   Procedure: LOWER EXTREMITY ANGIOGRAM;  Surgeon: Serafina Mitchell, MD;  Location: [facility] CATH LAB;  Service: Cardiovascular;  Laterality: Left;  lt leg angio  . rotator cuff surgery    . THYROID SURGERY      Allergies  Allergen Reactions  . Ciprofloxacin Other (See Comments)  . Plavix [Clopidogrel Bisulfate] Palpitations  . Amoxicillin Itching and Swelling    FACE & EYES SWELL  .  Ace Inhibitors   . Ciprofloxacin Hcl Swelling  . Lyrica [Pregabalin]     Itch, gain weight  . Penicillins Other (See Comments)    REACTION: unspecified    Current Outpatient Medications  Medication Sig Dispense Refill  . amLODipine (NORVASC) 10 MG tablet TAKE 1 TABLET BY MOUTH EVERY DAY (Patient taking differently: Take 10 mg by mouth daily. ) 90 tablet 1  . aspirin EC 81 MG tablet Take 81 mg by mouth at bedtime.     . bisoprolol-hydrochlorothiazide (ZIAC) 5-6.25 MG tablet TAKE 1/2-1 TABLET BY MOUTH DAILY 90 tablet 0  . Blood Glucose Monitoring Suppl (ACCU-CHEK AVIVA PLUS) W/DEVICE KIT Check blood sugar up to three times daily 1 kit 0  . buPROPion (WELLBUTRIN XL) 150 MG 24 hr tablet TAKE 1 TABLET BY MOUTH EVERY DAY IN THE MORNING 90 tablet 1  . Cholecalciferol (VITAMIN D PO) Take 5,000 Units by mouth daily.    Marland Kitchen CINNAMON PO Take 1,000 mg by mouth 2 (two) times daily.     . citalopram (CELEXA) 40 MG tablet TAKE 1 TABLET DAILY FOR MOOD & ANXIETY 90 tablet 1  . clonazePAM (KLONOPIN) 0.5 MG tablet PLEASE SEE ATTACHED FOR DETAILED DIRECTIONS (Patient taking differently: Take 0.5 mg by mouth at bedtime. PLEASE SEE ATTACHED FOR DETAILED DIRECTIONS) 60 tablet 1  . glucose blood (ACCU-CHEK AVIVA PLUS) test strip Use daily to check BS TID Dx. E11.22 300 each 1  . insulin NPH-regular Human (NOVOLIN 70/30) (70-30) 100 UNIT/ML injection INJECT 25 UNITS IN THE AM [place] WITH FOOD, AND INJECT 25 UNITS [place] WITH EVENING MEAL. (Patient taking differently: Inject 20-25 Units into the skin See admin instructions. INJECT 25 UNITS IN THE AM [place] WITH FOOD, AND INJECT 20 UNITS [place] WITH EVENING MEAL.) 60 mL 1  . Insulin Syringe-Needle U-100 31G X 15/64" 0.5 ML MISC Use  two pens daily with insulin 100 each 3  . Magnesium 500 MG TABS Take 500 mg by mouth daily.    Marland Kitchen olmesartan (BENICAR) 40 MG tablet Take 1 tablet daily for BP 90 tablet 1  . Omega-3 Fatty Acids (FISH OIL) 1200 MG CAPS Take 1,200 capsules by mouth daily.     Marland Kitchen  omeprazole (PRILOSEC) 40 MG capsule Take 1 capsule (40 mg total) by mouth daily. 90 capsule 2  . OVER THE COUNTER MEDICATION Take 1 tablet by mouth daily. One a day vitamin Woman's one a day    . rosuvastatin (CRESTOR) 40 MG tablet Take 1/2 to 1 tablet daily or as directed for Cholesterol (Patient taking differently: Take 20 mg by mouth at bedtime. Take 1/2 to 1 tablet daily or as directed for Cholesterol) 90 tablet 1  . vitamin E 400 UNIT capsule Take 400 Units by mouth daily.     No current facility-administered medications for this visit.      REVIEW OF SYSTEMS: See HPI for pertinent positives and negatives.  Physical Examination Vitals:   10/02/18 1219 10/02/18 1222  BP: (!) 149/54 (!) 153/67  Pulse: (!) 56   Resp: 20   Temp: 97.8 F (36.6 C)   SpO2: 93%   Weight: 189 lb (85.7 kg)   Height: 5' 6"  (1.676 m)    Body mass index is 30.51 kg/m.  General:  WDWN obese female in NAD Gait: Normal HENT: WNL Eyes: Pupils equal Pulmonary: normal non-labored breathing, good air movement in all fields, CTAB, without Rales, rhonchi,  wheezing Cardiac: Regular rhythm, bradycardic (on a beta blocker), no murmur detected Abdomen: soft, NT, no masses palpated Skin: no rashes, no ulcers, no cellulitis.   VASCULAR EXAM  Carotid Bruits Right Left   Positive Negative      Radial pulses are 1+ palpable bilaterally   Adominal aortic pulse is not palpable                      VASCULAR EXAM: Extremities without ischemic changes, without Gangrene; without open wounds.                                                                                                          LE Pulses Right Left       FEMORAL  1+ palpable  1+ palpable        POPLITEAL  not palpable   not palpable       POSTERIOR TIBIAL  not palpable   not palpable        DORSALIS PEDIS      ANTERIOR TIBIAL 1+ palpable  not palpable     Musculoskeletal: no muscle wasting or atrophy; no peripheral edema  Neurologic:   A&O X 3; appropriate affect, sensation is normal; speech is slightly halting and rambling, CN 2-12 intact, pain and light touch intact in extremities, motor exam as listed above. Psychiatric: Normal thought content, mood appropriate to clinical situation.     ASSESSMENT:  [name] is a 73 y.o. female who  is s/p stenting of her left superficial femoral artery in September of 2012. This alleviated her symptoms initially. 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  She has a known cerebral aneurysm that followed by her neurologist, and who was also following her carotid arteries, has had several TIA's. However, Dr. Melton Alar retired. Shewas hospitalized5/8-06/2017 with a smallleftThalamic / Internal Capsule Infarction by MRI manifested by a mild right hemiparesisand numbness of the leftface &hand.She has residual numbness in her right arm.  Her knees are painful which linits her walking; she does not seem to be able to walk enough to elicit claudication symptoms.  She has no signs of ischemia in her feet or legs. Femoral pulses are 1+ palpable bilaterally.  Her atherosclerotic risk factors include uncontrolledDM and active smoking. She is no longer seeing an endocrinologist. She takes a daily statin and ASA.   Hx of last stroke in May 2018, minimal stenosis of the bilateral ICA.   She has gained weight from the increased insulin use, is now obese, and her knees are more painful, and she walks less due to this.    DATA  Carotid Duplex (10-02-18): 1-39% stenosis of the bilateral ICA. Left ECA: >50% stenosis Left CCA: > 50% stenosis Bilateral vertebral artery flow is antegrade.  Bilateral subclavian artery waveforms are normal.  No significant change compared to the exam on 01-26-17.     ABI (Date: 10/02/2018):  R:   ABI: 0.57 (was 0.67 on 03-27-18),   PT: mono  DP: mono  TBI:  0.44, toe pressure  81 (was 0.41)  L:   ABI: 0.54 (was 0.58),   PT: mono  DP: mono  TBI: 0.39, toe pressure 72 (was 0.36) Slight decline in bilateral ABI, moderate disease bilaterally, all monophasic waveforms.    PLAN:   Based on today's exam and non-invasive vascular lab results, the patient will follow up in 1 year with the following tests: carotid duplex and ABI's. I advised her to call us if she develops concerns re the circulation in her feet or legs.   Walk at least 30 minutes total daily, if possible (painful knees), or ride stationary bike daily. Daily seated leg exercises.   I discussed in depth with the patient the nature of atherosclerosis, and emphasized the importance of maximal medical management including strict control of blood pressure, blood glucose, and lipid levels, obtaining regular exercise, and cessation of smoking.  The patient is aware that without maximal medical management the underlying atherosclerotic disease process will progress, limiting the benefit of any interventions.  The patient was given information about stroke prevention and what symptoms should prompt the patient to seek immediate medical care.  The patient was given information about PAD including signs, symptoms, treatment, what symptoms should prompt the patient to seek immediate medical care, and risk reduction measures to take.  Thank you for allowing [place] to participate in this patient's care.  Clemon Chambers, RN, MSN, FNP-C [facility] Office: [phone]  Clinic MD: Trula Slade 10/02/2018 12:52 PM

## 2018-10-09 ENCOUNTER — Other Ambulatory Visit: Payer: Self-pay | Admitting: Physician Assistant

## 2018-10-09 DIAGNOSIS — F411 Generalized anxiety disorder: Secondary | ICD-10-CM

## 2018-10-24 ENCOUNTER — Ambulatory Visit: Payer: Self-pay | Admitting: Internal Medicine

## 2018-11-01 ENCOUNTER — Encounter: Payer: Self-pay | Admitting: Internal Medicine

## 2018-11-10 ENCOUNTER — Other Ambulatory Visit: Payer: Self-pay | Admitting: Adult Health

## 2018-11-10 ENCOUNTER — Encounter: Payer: Self-pay | Admitting: Physician Assistant

## 2018-11-10 ENCOUNTER — Other Ambulatory Visit: Payer: Self-pay | Admitting: Internal Medicine

## 2018-11-10 DIAGNOSIS — F411 Generalized anxiety disorder: Secondary | ICD-10-CM

## 2018-11-10 NOTE — Progress Notes (Signed)
Medicare Wellness and 3 month   Assessment:    Pulmonary emphysema, unspecified emphysema type ([place]) -     Continuous Blood Gluc Sensor ([place]) MISC; Check sugars 3 x a day DX E10.9 - anoro samples given to patient to try for 1 month and will follow up in 1 month  Shortness of breath -     CT Chest Wo Contrast; Future - get imaging of lungs, will start anoro x 1 month, will refer to cardiology for SOB and palpitations, patient high risk for CAD  Essential hypertension - start medication and monitor at home, DASH diet, exercise. Call if greater than 130/80.  -     CBC with Differential/Platelet -     BASIC METABOLIC PANEL WITH GFR -     Hepatic function panel  Peripheral vascular disease due to secondary diabetes mellitus (HCC) Monitor feet Increase walking  Cerebrovascular accident (CVA) due to thrombosis of left posterior cerebral artery (HCC) Control blood pressure, cholesterol, glucose, increase exercise.   PVD (peripheral vascular disease) (HCC) Control blood pressure, cholesterol, glucose, increase exercise.  Continue bASA STOP smoking/control sugars Patient is very frustrated, upset that everyone keeps blaming her smoking and diabetes, discussed the disease process of atherosclerosis and inflammation.   CKD stage 3 due to type 2 diabetes mellitus (HCC) Increase fluids, avoid NSAIDS, monitor sugars, will monitor -     BASIC METABOLIC PANEL WITH GFR  Insulin-requiring or dependent type II diabetes mellitus ([place]) Bring in 1 month sugars and diet check, may adjust sugars - continue to be on insulin - only checking her sugars 1-2 x a week, will try to get freestyle libre for better glucose monitoring and control -     Hemoglobin A1c  Tobacco abuse Has nodule, need repeat scan- odered Smoking cessation-  instruction/counseling given, counseled patient on the dangers of tobacco use, advised patient to stop smoking, and reviewed strategies to maximize  success, patient not ready to quit at this time.  -     CT Chest Wo Contrast; Future  Depression, major, recurrent, in partial remission ([place]) - continue medications, stress management techniques discussed, increase water, good sleep hygiene discussed, increase exercise, and increase veggies.   Medication management -     Cancel: Magnesium  Atherosclerosis of native artery of extremity with intermittent claudication, unspecified extremity ([place]) Control blood pressure, cholesterol, glucose, increase exercise.   Vitamin D deficiency Continue supplement  Hyperlipidemia, mixed -continue medications, check lipids, decrease fatty foods, increase activity.  -     Lipid panel  Brain aneurysm Stop smoking, get on BP med Will refer to neurology for evaluation  ESOPHAGEAL STRICTURE Continue PPI  Gastroesophageal reflux disease, esophagitis presence not specified Continue PPI/H2 blocker, diet discussed  Nontoxic multinodular goiter Monitor  History of CVA (cerebrovascular accident) Control blood pressure, cholesterol, glucose, increase exercise.   Glaucoma, unspecified glaucoma type, unspecified laterality Will get Dr. Herbert Deaner notes  Diverticulosis of large intestine without hemorrhage Increase fiber  History of colonic polyps UTD   Encounter for Medicare annual wellness exam 1 year  BMI 29.0-29.9,adult  Overweight  - long discussion about weight loss, diet, and exercise -recommended diet heavy in fruits and veggies and low in animal meats, cheeses, and dairy products  B12 deficiency -     Vitamin B12  Anemia, unspecified type -     Iron,Total/Total Iron Binding Cap     Future Appointments  Date Time Provider [place]  12/05/2018  3:30 PM Marzetta Board, DPM  [facility]  12/11/2018  3:30 PM [name], PA-C [facility] None  05/14/2019 10:00 AM [name], MD [facility] None  ' Plan:   During the course of the visit the patient  was educated and counseled about appropriate screening and preventive services including:    Pneumococcal vaccine   Influenza vaccine  Td vaccine  Screening electrocardiogram  Screening mammography  Screening Pap smear and pelvic exam   Bone densitometry screening  Colorectal cancer screening  Diabetes screening  Glaucoma screening  Nutrition counseling   Smoking cessation counseling   Subjective:   [name] is a 73 y.o. female who presents for Medicare Annual Wellness Visit and 3 month follow up on hypertension, diabetes, hyperlipidemia, vitamin D def.   Her blood pressure has been controlled at home, today their BP is BP: (!) 148/70   She does workout rides her bike at home. She has SOB and has been having palpitations. She is really unable to exercise significantly due to pain in her legs, she is rather inactive. She is very concerned with her ADLs and being able to keep her independence/quality of life. She has numbness bilateral feet and right hand. She has lower back pain, pain bilateral legs with walking and standing.   She has COPD, has lung nodules, needs repeat CT chest (10/10/2017).    She was following with Dr. Windy Kalata, but he retired and she has not followed up with nuerology. She has been having abnormal vision, will reschedule Dr. Herbert Deaner, and she would like to follow up with neurologist. Will send in referral.   She is on cholesterol medication, crestor 40 and denies myalgias. Her cholesterol is not at goal. The cholesterol last visit was:   Lab Results  Component Value Date   CHOL 143 07/19/2018   HDL 47 (L) 07/19/2018   LDLCALC 76 07/19/2018   LDLDIRECT 146.4 01/30/2010   TRIG 121 07/19/2018   CHOLHDL 3.0 07/19/2018   She has several comorbidities from her DM including neuropathy, nephropathy, history of TIA and PAD s/p stents, she been working on diet and exercise for diabetes, and denies polydipsia, polyuria and visual disturbances.   She  is on ASA 325 and ARB. She is on MF,  Novolog mix 70/30 she is on 25 units depending on her number and 25 units at night with food. She checks her sugar a few times a week. She is very frustrated, asking why she has to be on  She could not tolerate invokana.  and Last A1C in the office was:  Lab Results  Component Value Date   HGBA1C 9.7 (H) 07/19/2018    Patient is on Vitamin D. BMI is Body mass index is 30.12 kg/m., she is working on diet and exercise. Wt Readings from Last 3 Encounters:  11/13/18 186 lb 9.6 oz (84.6 kg)  10/02/18 189 lb (85.7 kg)  08/28/18 189 lb (85.7 kg)    Names of Other Physician/Practitioners you currently use: 1. [facility]- here for primary care 2. Dr. Herbert Deaner, DIABETIC eye doctor, last visit 10/31/2017 Retinopathy Patient Care Team: [name], MD as PCP - General (Internal Medicine) Michel Santee, MD as Consulting Physician (Neurology) Serafina Mitchell, MD as Consulting Physician (Vascular Surgery) Irene Shipper, MD as Consulting Physician (Gastroenterology) Gardiner Barefoot, DPM as Consulting Physician (Podiatry) Hortencia Pilar, MD as Consulting Physician (Surgery)  Medication Review Current Outpatient Medications on File Prior to Visit  Medication Sig  . amLODipine (NORVASC) 10 MG tablet  TAKE 1 TABLET BY MOUTH EVERY DAY (Patient taking differently: Take 10 mg by mouth daily. )  . aspirin EC 81 MG tablet Take 81 mg by mouth at bedtime.   . bisoprolol-hydrochlorothiazide (ZIAC) 5-6.25 MG tablet TAKE 1/2-1 TABLET BY MOUTH DAILY  . Blood Glucose Monitoring Suppl (ACCU-CHEK AVIVA PLUS) W/DEVICE KIT Check blood sugar up to three times daily  . buPROPion (WELLBUTRIN XL) 150 MG 24 hr tablet TAKE 1 TABLET BY MOUTH EVERY DAY IN THE MORNING  . Cholecalciferol (VITAMIN D PO) Take 5,000 Units by mouth daily.  Marland Kitchen CINNAMON PO Take 1,000 mg by mouth 2 (two) times daily.   . citalopram (CELEXA) 40 MG tablet TAKE 1 TABLET  DAILY FOR MOOD & ANXIETY  . clonazePAM (KLONOPIN) 0.5 MG tablet Take 1/2 to 1 tablet at Bedtime ONLY if Needed & please try to limit to 5 days /week to avoid addiction  . glucose blood (ACCU-CHEK AVIVA PLUS) test strip Use daily to check BS TID Dx. E11.22  . insulin NPH-regular Human (NOVOLIN 70/30) (70-30) 100 UNIT/ML injection INJECT 25 UNITS IN THE AM [place] WITH FOOD, AND INJECT 25 UNITS [place] WITH EVENING MEAL.  Marland Kitchen Insulin Syringe-Needle U-100 31G X 15/64" 0.5 ML MISC Use two pens daily with insulin  . Magnesium 500 MG TABS Take 500 mg by mouth daily.  Marland Kitchen olmesartan (BENICAR) 40 MG tablet Take 1 tablet daily for BP  . Omega-3 Fatty Acids (FISH OIL) 1200 MG CAPS Take 1,200 capsules by mouth daily.   Marland Kitchen omeprazole (PRILOSEC) 40 MG capsule Take 1 capsule (40 mg total) by mouth daily.  Marland Kitchen OVER THE COUNTER MEDICATION Take 1 tablet by mouth daily. One a day vitamin Woman's one a day  . rosuvastatin (CRESTOR) 40 MG tablet Take 1/2 to 1 tablet daily or as directed for Cholesterol (Patient taking differently: Take 20 mg by mouth at bedtime. Take 1/2 to 1 tablet daily or as directed for Cholesterol)  . vitamin E 400 UNIT capsule Take 400 Units by mouth daily.   No current facility-administered medications on file prior to visit.     Current Problems (verified) Patient Active Problem List   Diagnosis Date Noted  . Emphysema of lung ([place]) 10/10/2017  . Atherosclerosis of aorta ([place]) 10/10/2017  . Lung nodule 10/10/2017  . Anxiety 08/22/2017  . Arthritis 08/22/2017  . Neuropathy 08/22/2017  . PVD (peripheral vascular disease) ([place]) 04/13/2017  . Hyperlipidemia, mixed 11/07/2015  . Insulin-requiring or dependent type II diabetes mellitus ([place]) 11/07/2015  . Encounter for Medicare annual wellness exam 06/24/2015  . Generalized anxiety disorder 03/26/2015  . Depression, major, recurrent, in partial remission ([place]) 03/26/2015  . History of CVA (cerebrovascular accident) 10/04/2014  . Tobacco abuse  10/04/2014  . Vitamin D deficiency 11/02/2013  . Medication management 11/02/2013  . Peripheral vascular disease due to secondary diabetes mellitus ([place]) 05/08/2012  . Atherosclerosis of native arteries of extremity with intermittent claudication 12/24/2011  . Diverticulosis of large intestine 06/09/2010  . History of colonic polyps 06/09/2010  . Esophageal reflux 08/27/2008  . Nontoxic multinodular goiter 03/11/2008  . Glaucoma 03/11/2008  . Brain aneurysm 03/11/2008  . CKD stage 3 due to type 2 diabetes mellitus ([place]) 05/01/2007  . Essential hypertension 05/01/2007    Screening Tests Immunization History  Administered Date(s) Administered  . DT 03/26/2015  . Influenza Whole 08/02/2007, 06/18/2009, 07/09/2013  . Influenza, High Dose Seasonal PF 08/05/2015, 07/22/2016, 07/04/2017  . Pneumococcal Conjugate-13 07/22/2016  . Pneumococcal Polysaccharide-23 02/08/2014  . Td  09/21/2004    Preventative care: Last colonoscopy: 2011 Dr. Henrene Pastor due 10 years Last mammogram: 09/2017 has scheduled Last pap smear/pelvic exam: 2007  DEXA: 2016 OVERDUE Echo 01/2017 EF 60-65% ABI 2014 MRI/MRA  brain 05.2018 IMPRESSION: 1 cm acute infarct left lateral thalamus and internal capsule Chronic infarct right cerebellum Moderate stenosis left posterior cerebral artery. No other significant intracranial stenosis.  Ct chest WO contrast 05/2018 5 mm LLL nodule- needs repeat CT AB 03/2017  Prior vaccinations: TD or Tdap: 2016  Influenza: 2018 DUE Pneumococcal: 2015 Prevnar 13:2017 Shingles/Zostavax: Check price  Allergies Allergies  Allergen Reactions  . Ciprofloxacin Other (See Comments)  . Plavix [Clopidogrel Bisulfate] Palpitations  . Amoxicillin Itching and Swelling    FACE & EYES SWELL  . Ace Inhibitors   . Ciprofloxacin Hcl Swelling  . Lyrica [Pregabalin]     Itch, gain weight  . Penicillins Other (See Comments)    REACTION: unspecified    SURGICAL HISTORY She  has a past  surgical history that includes Cesarean section; Cholecystectomy; Knee surgery; rotator cuff surgery; Thyroid surgery; Hernia repair; Abdominal hysterectomy (1984); Femoral artery stent (05/11/11); Dental surgery (Aug. 16, 2013); Femoral-popliteal Bypass Graft (Nov. 26, 2013); Eye surgery (Nov. 2014); Esophagogastroduodenoscopy (07/2010); Colonoscopy (07/2010); abdominal aortagram (N/A, 01/04/2012); abdominal aortagram (N/A, 08/15/2012); lower extremity angiogram (Left, 08/15/2012); cataract surgery (Right, 10-22-2015); and cataract surgery (Left, 11-05-2015). FAMILY HISTORY Her family history includes CAD (age of onset: 81) in her brother; CAD (age of onset: 21) in her father and mother; Diabetes in her sister; Heart attack in her brother, brother, father, and mother; Heart disease in her brother, brother, father, and mother; Hypertension in her mother and sister. SOCIAL HISTORY She  reports that she has been smoking cigarettes. She has a 10.00 pack-year smoking history. She has never used smokeless tobacco. She reports that she does not drink alcohol or use drugs.  MEDICARE WELLNESS OBJECTIVES: Physical activity: Current Exercise Habits: The patient does not participate in regular exercise at present, Exercise limited by: respiratory conditions(s);neurologic condition(s);Other - see comments(PAD) Cardiac risk factors: Cardiac Risk Factors include: advanced age (>39mn, >>35women);diabetes mellitus;dyslipidemia;family history of premature cardiovascular disease;hypertension;sedentary lifestyle;smoking/ tobacco exposure Depression/mood screen:   [facility]  Decreased Interest 1  Down, Depressed, Hopeless 0  PHQ - 2 Score 1  Some recent data might be hidden    ADLs:  In your present state of health, do you have any difficulty performing the following activities: 11/13/2018 04/22/2018  Hearing? N N  Vision? N N  Difficulty concentrating or making decisions? Y N  Walking or  climbing stairs? Y N  Dressing or bathing? N N  Doing errands, shopping? N N  Some recent data might be hidden     Cognitive Testing  Alert? Yes  Normal Appearance?Yes  Oriented to person? Yes  Place? Yes   Time? Yes  Recall of three objects?  Yes  Can perform simple calculations? Yes  Displays appropriate judgment?Yes  Can read the correct time from a watch face?Yes  EOL planning: Does Patient Have a Medical Advance Directive?: Yes Does patient want to make changes to medical advance directive?: No - Patient declined Yes  Objective:   Blood pressure (!) 148/70, pulse (!) 53, temperature (!) 97.5 F (36.4 C), height '5\' 6"'$  (1.676 m), weight 186 lb 9.6 oz (84.6 kg), SpO2 96 %. Body mass index is 30.12 kg/m.  General appearance: alert, no distress, WD/WN,  female HEENT: normocephalic, sclerae anicteric, TMs pearly, nares patent, no  discharge or erythema, pharynx normal. Oral cavity: MMM, no lesions Neck: supple, no lymphadenopathy, no thyromegaly, no masses Heart: RRR, normal S1, S2, no murmurs Lungs: CTA bilaterally, no wheezes, rhonchi, or rales Abdomen: +bs, soft, non tender, non distended, no masses, no hepatomegaly, no splenomegaly Musculoskeletal: nontender, no swelling, no obvious deformity Extremities: no edema, no cyanosis, no clubbing Pulses: 1+ symmetric, upper and lower extremities, normal cap refill Neurological: alert, oriented x 3, CN2-12 intact, strength normal upper extremities and lower extremities, sensation  decreased throughout bottom of bilateral feet, DTRs 2+ throughout, no cerebellar signs, gait normal Psychiatric: normal affect, behavior normal, pleasant  Breast: defer Gyn: defer Rectal: defer  Medicare Attestation I have personally reviewed: The patient's medical and social history Their use of alcohol, tobacco or illicit drugs Their current medications and supplements The patient's functional ability including ADLs,fall risks, home safety risks,  cognitive, and hearing and visual impairment Diet and physical activities Evidence for depression or mood disorders  The patient's weight, height, BMI, and visual acuity have been recorded in the chart.  I have made referrals, counseling, and provided education to the patient based on review of the above and I have provided the patient with a written personalized care plan for preventive services.     [name], PA-C   11/13/2018

## 2018-11-13 ENCOUNTER — Encounter: Payer: Self-pay | Admitting: Physician Assistant

## 2018-11-13 ENCOUNTER — Ambulatory Visit (INDEPENDENT_AMBULATORY_CARE_PROVIDER_SITE_OTHER): Payer: Medicare Other | Admitting: Physician Assistant

## 2018-11-13 VITALS — BP 148/70 | HR 53 | Temp 97.5°F | Ht 66.0 in | Wt 186.6 lb

## 2018-11-13 DIAGNOSIS — E559 Vitamin D deficiency, unspecified: Secondary | ICD-10-CM

## 2018-11-13 DIAGNOSIS — R911 Solitary pulmonary nodule: Secondary | ICD-10-CM

## 2018-11-13 DIAGNOSIS — E042 Nontoxic multinodular goiter: Secondary | ICD-10-CM

## 2018-11-13 DIAGNOSIS — R0602 Shortness of breath: Secondary | ICD-10-CM

## 2018-11-13 DIAGNOSIS — G629 Polyneuropathy, unspecified: Secondary | ICD-10-CM

## 2018-11-13 DIAGNOSIS — E119 Type 2 diabetes mellitus without complications: Secondary | ICD-10-CM

## 2018-11-13 DIAGNOSIS — K219 Gastro-esophageal reflux disease without esophagitis: Secondary | ICD-10-CM | POA: Diagnosis not present

## 2018-11-13 DIAGNOSIS — I671 Cerebral aneurysm, nonruptured: Secondary | ICD-10-CM

## 2018-11-13 DIAGNOSIS — J439 Emphysema, unspecified: Secondary | ICD-10-CM | POA: Diagnosis not present

## 2018-11-13 DIAGNOSIS — F419 Anxiety disorder, unspecified: Secondary | ICD-10-CM

## 2018-11-13 DIAGNOSIS — N183 Chronic kidney disease, stage 3 unspecified: Secondary | ICD-10-CM

## 2018-11-13 DIAGNOSIS — R6889 Other general symptoms and signs: Secondary | ICD-10-CM | POA: Diagnosis not present

## 2018-11-13 DIAGNOSIS — I10 Essential (primary) hypertension: Secondary | ICD-10-CM

## 2018-11-13 DIAGNOSIS — Z79899 Other long term (current) drug therapy: Secondary | ICD-10-CM

## 2018-11-13 DIAGNOSIS — F411 Generalized anxiety disorder: Secondary | ICD-10-CM

## 2018-11-13 DIAGNOSIS — Z8673 Personal history of transient ischemic attack (TIA), and cerebral infarction without residual deficits: Secondary | ICD-10-CM

## 2018-11-13 DIAGNOSIS — I700 Atherosclerosis of aorta: Secondary | ICD-10-CM | POA: Diagnosis not present

## 2018-11-13 DIAGNOSIS — Z860100 Personal history of colon polyps, unspecified: Secondary | ICD-10-CM

## 2018-11-13 DIAGNOSIS — D649 Anemia, unspecified: Secondary | ICD-10-CM | POA: Diagnosis not present

## 2018-11-13 DIAGNOSIS — F3341 Major depressive disorder, recurrent, in partial remission: Secondary | ICD-10-CM | POA: Diagnosis not present

## 2018-11-13 DIAGNOSIS — E538 Deficiency of other specified B group vitamins: Secondary | ICD-10-CM | POA: Diagnosis not present

## 2018-11-13 DIAGNOSIS — J849 Interstitial pulmonary disease, unspecified: Secondary | ICD-10-CM

## 2018-11-13 DIAGNOSIS — E1122 Type 2 diabetes mellitus with diabetic chronic kidney disease: Secondary | ICD-10-CM

## 2018-11-13 DIAGNOSIS — I739 Peripheral vascular disease, unspecified: Secondary | ICD-10-CM

## 2018-11-13 DIAGNOSIS — M1990 Unspecified osteoarthritis, unspecified site: Secondary | ICD-10-CM | POA: Diagnosis not present

## 2018-11-13 DIAGNOSIS — E782 Mixed hyperlipidemia: Secondary | ICD-10-CM

## 2018-11-13 DIAGNOSIS — Z0000 Encounter for general adult medical examination without abnormal findings: Secondary | ICD-10-CM

## 2018-11-13 DIAGNOSIS — K5730 Diverticulosis of large intestine without perforation or abscess without bleeding: Secondary | ICD-10-CM

## 2018-11-13 DIAGNOSIS — Z794 Long term (current) use of insulin: Secondary | ICD-10-CM

## 2018-11-13 DIAGNOSIS — Z0001 Encounter for general adult medical examination with abnormal findings: Secondary | ICD-10-CM | POA: Diagnosis not present

## 2018-11-13 DIAGNOSIS — Z720 Tobacco use: Secondary | ICD-10-CM

## 2018-11-13 DIAGNOSIS — H409 Unspecified glaucoma: Secondary | ICD-10-CM

## 2018-11-13 DIAGNOSIS — E1351 Other specified diabetes mellitus with diabetic peripheral angiopathy without gangrene: Secondary | ICD-10-CM

## 2018-11-13 DIAGNOSIS — I70219 Atherosclerosis of native arteries of extremities with intermittent claudication, unspecified extremity: Secondary | ICD-10-CM

## 2018-11-13 MED ORDER — FREESTYLE LIBRE SENSOR SYSTEM MISC: 0 refills | Status: DC

## 2018-11-14 LAB — CBC WITH DIFFERENTIAL/PLATELET
Absolute Monocytes: 670 cells/uL (ref 200–950)
BASOS ABS: 93 {cells}/uL (ref 0–200)
Basophils Relative: 0.9 %
EOS PCT: 0.7 %
Eosinophils Absolute: 72 cells/uL (ref 15–500)
HEMATOCRIT: 48.3 % — AB (ref 35.0–45.0)
Hemoglobin: 16.5 g/dL — ABNORMAL HIGH (ref 11.7–15.5)
Lymphs Abs: 2266 cells/uL (ref 850–3900)
MCH: 33.3 pg — AB (ref 27.0–33.0)
MCHC: 34.2 g/dL (ref 32.0–36.0)
MCV: 97.6 fL (ref 80.0–100.0)
MONOS PCT: 6.5 %
MPV: 11.4 fL (ref 7.5–12.5)
NEUTROS PCT: 69.9 %
Neutro Abs: 7200 cells/uL (ref 1500–7800)
Platelets: 243 10*3/uL (ref 140–400)
RBC: 4.95 10*6/uL (ref 3.80–5.10)
RDW: 11.1 % (ref 11.0–15.0)
Total Lymphocyte: 22 %
WBC: 10.3 10*3/uL (ref 3.8–10.8)

## 2018-11-14 LAB — COMPLETE METABOLIC PANEL WITH GFR
AG RATIO: 1.1 (calc) (ref 1.0–2.5)
ALKALINE PHOSPHATASE (APISO): 96 U/L (ref 37–153)
ALT: 21 U/L (ref 6–29)
AST: 29 U/L (ref 10–35)
Albumin: 3.5 g/dL — ABNORMAL LOW (ref 3.6–5.1)
BUN/Creatinine Ratio: 10 (calc) (ref 6–22)
BUN: 16 mg/dL (ref 7–25)
CALCIUM: 9.5 mg/dL (ref 8.6–10.4)
CHLORIDE: 102 mmol/L (ref 98–110)
CO2: 32 mmol/L (ref 20–32)
Creat: 1.59 mg/dL — ABNORMAL HIGH (ref 0.60–0.93)
GFR, Est African American: 37 mL/min/{1.73_m2} — ABNORMAL LOW (ref >=60)
GFR, Est Non African American: 32 mL/min/{1.73_m2} — ABNORMAL LOW (ref >=60)
GLOBULIN: 3.3 g/dL (ref 1.9–3.7)
Glucose, Bld: 342 mg/dL — ABNORMAL HIGH (ref 65–99)
POTASSIUM: 4.8 mmol/L (ref 3.5–5.3)
SODIUM: 143 mmol/L (ref 135–146)
Total Bilirubin: 0.6 mg/dL (ref 0.2–1.2)
Total Protein: 6.8 g/dL (ref 6.1–8.1)

## 2018-11-14 LAB — MAGNESIUM: MAGNESIUM: 1.9 mg/dL (ref 1.5–2.5)

## 2018-11-14 LAB — IRON, TOTAL/TOTAL IRON BINDING CAP
%SAT: 32 % (ref 16–45)
Iron: 126 ug/dL (ref 45–160)
TIBC: 397 mcg/dL (calc) (ref 250–450)

## 2018-11-14 LAB — HEMOGLOBIN A1C
EAG (MMOL/L): 14.7 (calc)
Hgb A1c MFr Bld: 10.9 % of total Hgb — ABNORMAL HIGH (ref <5.7)
Mean Plasma Glucose: 266 (calc)

## 2018-11-14 LAB — LIPID PANEL
CHOLESTEROL: 134 mg/dL (ref <200)
HDL: 40 mg/dL — AB (ref >=50)
LDL Cholesterol (Calc): 73 mg/dL (calc)
Non-HDL Cholesterol (Calc): 94 mg/dL (calc) (ref <130)
TRIGLYCERIDES: 131 mg/dL (ref <150)
Total CHOL/HDL Ratio: 3.4 (calc) (ref <5.0)

## 2018-11-14 LAB — TSH: TSH: 1.65 m[IU]/L (ref 0.40–4.50)

## 2018-11-14 LAB — VITAMIN B12: Vitamin B-12: 678 pg/mL (ref 200–1100)

## 2018-11-14 LAB — CK: Total CK: 197 U/L — ABNORMAL HIGH (ref 29–143)

## 2018-11-16 NOTE — Progress Notes (Addendum)
Cardiology Office Note   Date:  11/17/2018   ID:  Nikoleta, Dady 09-19-1946, MRN [id number]  PCP:  [name], MD  Cardiologist:   No primary care provider on file. Referring:  [name], MD  Chief Complaint  Patient presents with  . Shortness of Breath      History of Present Illness: [name] is a 73 y.o. female who was referred by [name], MD for evaluation of presents for aortic calcification.  She also has palpitations and SOB.    She has a significant history of peripheral vascular disease as below with stenting.  She is not had any prior cardiac history.  She has been noted in the past on CTs to follow-up pulmonary nodules to have aortic atherosclerosis and coronary artery atherosclerosis.  She had a stress test many years ago.  Echo in 2018 demonstrated NL systolic function.    She is referred for evaluation of shortness of breath.  She also is referred for the aortic and coronary calcifications.  Her shortness of breath has been progressively more severe.  She has a longstanding and ongoing smoking history.  She is short of breath walking short distance on level ground.  She is not describing resting shortness of breath, PND or orthopnea.  She is not been having any palpitations, presyncope or syncope.  She is had no weight gain or edema.  She is not describing chest pressure, neck or arm discomfort.  She has had some isolated palpitations that she notices occasionally at night.  Past Medical History:  Diagnosis Date  . Abnormal findings on esophagogastroduodenoscopy (EGD) 07/2010  . 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 Aneurysm ([place])    in brain x 2  . Anxiety   . Colon polyps   . Diabetes mellitus 1998  . Diverticulosis   . ESOPHAGEAL STRICTURE 08/27/2008  . GERD (gastroesophageal reflux disease)   . Hemorrhoids   . Hiatal hernia   . Hyperlipidemia   . Hypertension 2000  . Migraines   . Peripheral vascular disease ([place])   .  Phlebitis    30 years ago  left leg  . Stroke [facility])    multiple mini strokes ( brain aneurysm )    Past Surgical History:  Procedure Laterality Date  . ABDOMINAL AORTAGRAM N/A 01/04/2012   Procedure: ABDOMINAL Maxcine Ham;  Surgeon: Serafina Mitchell, MD;  Location: [facility] CATH LAB;  Service: Cardiovascular;  Laterality: N/A;  . ABDOMINAL AORTAGRAM N/A 08/15/2012   Procedure: ABDOMINAL Maxcine Ham;  Surgeon: Serafina Mitchell, MD;  Location[facility] CATH LAB;  Service: Cardiovascular;  Laterality: N/A;  . ABDOMINAL HYSTERECTOMY  1984  . cataract surgery Right 10-22-2015  . cataract surgery Left 11-05-2015  . CESAREAN SECTION    . CHOLECYSTECTOMY    . COLONOSCOPY  07/2010  . DENTAL SURGERY  Aug. 16, 2013   left lower   . ESOPHAGOGASTRODUODENOSCOPY  07/2010  . EYE SURGERY  Nov. 2014   Laser-Glaucoma  . FEMORAL ARTERY STENT  05/11/11   Left superficial femoral and popliteal artery  . HERNIA REPAIR     times two  . KNEE SURGERY    . LOWER EXTREMITY ANGIOGRAM Left 08/15/2012   Procedure: LOWER EXTREMITY ANGIOGRAM;  Surgeon: Serafina Mitchell, MD;  Location: [facility] CATH LAB;  Service: Cardiovascular;  Laterality: Left;  lt leg angio  . rotator cuff surgery    . THYROID SURGERY       Current Outpatient Medications  Medication Sig Dispense Refill  . amLODipine (NORVASC) 10 MG tablet TAKE 1 TABLET BY MOUTH EVERY DAY (Patient taking differently: Take 10 mg by mouth daily. ) 90 tablet 1  . aspirin EC 81 MG tablet Take 81 mg by mouth at bedtime.     . bisoprolol-hydrochlorothiazide (ZIAC) 5-6.25 MG tablet TAKE 1/2-1 TABLET BY MOUTH DAILY 90 tablet 0  . Blood Glucose Monitoring Suppl (ACCU-CHEK AVIVA PLUS) W/DEVICE KIT Check blood sugar up to three times daily 1 kit 0  . Cholecalciferol (VITAMIN D PO) Take 5,000 Units by mouth daily.    Marland Kitchen CINNAMON PO Take 1,000 mg by mouth 2 (two) times daily.     . citalopram (CELEXA) 40 MG tablet TAKE 1 TABLET DAILY FOR MOOD & ANXIETY 90 tablet 1  . clonazePAM (KLONOPIN)  0.5 MG tablet Take 1/2 to 1 tablet at Bedtime ONLY if Needed & please try to limit to 5 days /week to avoid addiction 90 tablet 0  . Continuous Blood Gluc Sensor (FREESTYLE LIBRE SENSOR SYSTEM) MISC Check sugars 3 x a day DX E10.9 1 each 0  . glucose blood (ACCU-CHEK AVIVA PLUS) test strip Use daily to check BS TID Dx. E11.22 300 each 1  . insulin NPH-regular Human (NOVOLIN 70/30) (70-30) 100 UNIT/ML injection INJECT 25 UNITS IN THE AM [place] WITH FOOD, AND INJECT 25 UNITS  WITH EVENING MEAL. 50 mL 2  . Insulin Syringe-Needle U-100 31G X 15/64" 0.5 ML MISC Use two pens daily with insulin 100 each 3  . Magnesium 500 MG TABS Take 500 mg by mouth daily.    Marland Kitchen olmesartan (BENICAR) 40 MG tablet Take 1 tablet daily for BP 90 tablet 1  . Omega-3 Fatty Acids (FISH OIL) 1200 MG CAPS Take 1,200 capsules by mouth daily.     Marland Kitchen omeprazole (PRILOSEC) 40 MG capsule TAKE 1 CAPSULE BY MOUTH EVERY DAY 90 capsule 2  . OVER THE COUNTER MEDICATION Take 1 tablet by mouth daily. One a day vitamin Woman's one a day    . rosuvastatin (CRESTOR) 40 MG tablet Take 40 mg by mouth daily.    . vitamin E 400 UNIT capsule Take 400 Units by mouth daily.     No current facility-administered medications for this visit.     Allergies:   Ciprofloxacin; Plavix [clopidogrel bisulfate]; Amoxicillin; Ace inhibitors; Ciprofloxacin hcl; Lyrica [pregabalin]; and Penicillins    Social History:  The patient  reports that she has been smoking cigarettes. She has a 10.00 pack-year smoking history. She has never used smokeless tobacco. She reports that she does not drink alcohol or use drugs.   Family History:  The patient's family history includes CAD (age of onset: 33) in her brother; CAD (age of onset: 63) in her father and mother; Diabetes in her sister; Heart attack in her brother, brother, father, and mother; Heart disease in her brother, brother, father, and mother; Hypertension in her mother and sister.    ROS:  Please see the history  of present illness.   Otherwise, review of systems are positive for none.   All other systems are reviewed and negative.    PHYSICAL EXAM: VS:  BP (!) 148/60 (BP Location: Left Arm)   Pulse (!) 54   Ht _0  (1.676 m)   Wt 187 lb 6.4 oz (85 kg)   BMI 30.25 kg/m  , BMI Body mass index is 30.25 kg/m. GENERAL:  Well appearing HEENT:  Pupils equal round and reactive, fundi not visualized, oral mucosa  unremarkable NECK:  No jugular venous distention, waveform within normal limits, carotid upstroke brisk and symmetric, soft right-sided bruits, no thyromegaly LYMPHATICS:  No cervical, inguinal adenopathy LUNGS:  Clear to auscultation bilaterally BACK:  No CVA tenderness CHEST:  Unremarkable HEART:  PMI not displaced or sustained,S1 and S2 within normal limits, no S3, no S4, no clicks, no rubs, no murmurs ABD:  Flat, positive bowel sounds normal in frequency in pitch, no bruits, no rebound, no guarding, no midline pulsatile mass, no hepatomegaly, no splenomegaly EXT:  2 plus pulses upper, absent dorsalis pedis and posterior tibialis bilateral lower, no edema, no cyanosis no clubbing SKIN:  No rashes no nodules NEURO:  Cranial nerves II through XII grossly intact, motor grossly intact throughout PSYCH:  Cognitively intact, oriented to person place and time    EKG:  EKG is ordered today. The ekg ordered today demonstrates sinus rhythm, rate 54, axis within normal limits, intervals within normal limits, no acute ST-T wave changes.   Recent Labs: 11/13/2018: ALT 21; BUN 16; Creat 1.59; Hemoglobin 16.5; Magnesium 1.9; Platelets 243; Potassium 4.8; Sodium 143; TSH 1.65    Lipid Panel    Component Value Date/Time   CHOL 134 11/13/2018 1129   TRIG 131 11/13/2018 1129   HDL 40 (L) 11/13/2018 1129   CHOLHDL 3.4 11/13/2018 1129   VLDL 29 01/26/2017 0440   LDLCALC 73 11/13/2018 1129   LDLDIRECT 146.4 01/30/2010 1056      Wt Readings from Last 3 Encounters:  11/17/18 187 lb 6.4 oz (85 kg)    11/13/18 186 lb 9.6 oz (84.6 kg)  10/02/18 189 lb (85.7 kg)      Other studies Reviewed: Additional studies/ records that were reviewed today include: Labs, CTs, CXRs. Review of the above records demonstrates:  Please see elsewhere in the note.     ASSESSMENT AND PLAN:  AORTIC CALCIFICATION: She needs aggressive risk reduction.  Further evaluation will be as below.  CORONARY CALCIFICATION: I like to screen her with stress testing but she would not be able walk on a treadmill.  Therefore, she will have a [employer].  PALPITATIONS:   She describes isolated palpitations that sound like PACs or PVCs.  These are not particularly symptomatic and I would not pursue these further.  We discussed these.  SOB: I suspect this is a primary pulmonary issue but could not exclude obstructive coronary disease as an etiology.  I like to screen her with a stress test.  Again she would not be able walk on a treadmill so she will have a Lexiscan Myoview.  Of note walking around the office her saturation did fall to the low 90s.  HTN: Blood pressure is still slightly elevated.  She is reluctant to take any more medications because she thinks it keeps going up with this.  I will continue education and try to titrate meds in the future.  She agrees to stay on what she has.  DYSLIPIDEMIA: Her LDL was 73 with an HDL of 40.  I am going to ease her into further therapy and may want to try to get her on Zetia in the future.  DM: Her A1c is greater than 10.  Her primary provider suggested referring her to endocrinology and I will take steps to do this. I will make a referral to Dr. Cruzita Lederer  TOBACCO ABUSE: I note on previous notes that she has been reluctant to discuss this or point to this as a problem.  She has been educated.  Patient not wanting to quit smoking at this point.  We will continue to have this discussion and try again in the future.  Have this conversation.  PVD:  She had stenting of her left  superficial femoral artery.   She has cerebral aneurysms followed elsewhere.  She has had CVA.    She is followed by vascular surgery.  Current medicines are reviewed at length with the patient today.  The patient does not have concerns regarding medicines.  The following changes have been made:  no change  Labs/ tests ordered today include:   Orders Placed This Encounter  Procedures  . Ambulatory referral to Endocrinology  . MYOCARDIAL PERFUSION IMAGING  . EKG 12-Lead     Disposition:   FU with me in 3 months.     Signed, Minus Breeding, MD  11/17/2018 5:16 PM    [place]

## 2018-11-17 ENCOUNTER — Other Ambulatory Visit: Payer: Self-pay | Admitting: Physician Assistant

## 2018-11-17 ENCOUNTER — Encounter: Payer: Self-pay | Admitting: Cardiology

## 2018-11-17 ENCOUNTER — Ambulatory Visit (INDEPENDENT_AMBULATORY_CARE_PROVIDER_SITE_OTHER): Payer: Medicare Other | Admitting: Cardiology

## 2018-11-17 VITALS — BP 148/60 | HR 54 | Ht 66.0 in | Wt 187.4 lb

## 2018-11-17 DIAGNOSIS — I700 Atherosclerosis of aorta: Secondary | ICD-10-CM

## 2018-11-17 DIAGNOSIS — R0602 Shortness of breath: Secondary | ICD-10-CM | POA: Diagnosis not present

## 2018-11-17 DIAGNOSIS — I517 Cardiomegaly: Secondary | ICD-10-CM

## 2018-11-17 DIAGNOSIS — E119 Type 2 diabetes mellitus without complications: Secondary | ICD-10-CM

## 2018-11-17 DIAGNOSIS — I779 Disorder of arteries and arterioles, unspecified: Secondary | ICD-10-CM | POA: Diagnosis not present

## 2018-11-17 DIAGNOSIS — Z794 Long term (current) use of insulin: Secondary | ICD-10-CM

## 2018-11-17 DIAGNOSIS — N183 Chronic kidney disease, stage 3 (moderate): Secondary | ICD-10-CM | POA: Diagnosis not present

## 2018-11-17 DIAGNOSIS — E1122 Type 2 diabetes mellitus with diabetic chronic kidney disease: Secondary | ICD-10-CM | POA: Diagnosis not present

## 2018-11-17 NOTE — Patient Instructions (Addendum)
Medication Instructions:  Continue current medications  If you need a refill on your cardiac medications before your next appointment, please call your pharmacy.  Labwork: None ordered   Testing/Procedures: Your physician has requested that you have a lexiscan myoview. For further information please visit [email or web address]. Please follow instruction sheet, as given.  Follow-Up: You have been referred to Dr Philemon Kingdom Endocrinologist  You will need a follow up appointment in 3 months.  Please call our office 2 months in advance to schedule this appointment.  You may see Dr Percival Spanish or one of the following Advanced Practice Providers on your designated Care Team:   Rosaria Ferries, PA-C . Jory Sims, DNP, ANP    At [facility], you and your health needs are our priority.  As part of our continuing mission to provide you with exceptional heart care, we have created designated Provider Care Teams.  These Care Teams include your primary Cardiologist (physician) and Advanced Practice Providers (APPs -  Physician Assistants and Nurse Practitioners) who all work together to provide you with the care you need, when you need it.  Thank you for choosing [facility]!!

## 2018-11-19 ENCOUNTER — Other Ambulatory Visit: Payer: Self-pay | Admitting: Internal Medicine

## 2018-11-19 DIAGNOSIS — I10 Essential (primary) hypertension: Secondary | ICD-10-CM

## 2018-11-21 ENCOUNTER — Telehealth (HOSPITAL_COMMUNITY): Payer: Self-pay

## 2018-11-21 NOTE — Telephone Encounter (Signed)
Encounter complete. 

## 2018-11-22 ENCOUNTER — Telehealth (HOSPITAL_COMMUNITY): Payer: Self-pay

## 2018-11-22 NOTE — Telephone Encounter (Signed)
Encounter complete. 

## 2018-11-23 ENCOUNTER — Ambulatory Visit (HOSPITAL_COMMUNITY)
Admission: RE | Admit: 2018-11-23 | Discharge: 2018-11-23 | Disposition: A | Discharge status: Home / Self Care | Payer: Medicare Other | Source: Ambulatory Visit | Attending: Internal Medicine | Admitting: Internal Medicine

## 2018-11-23 ENCOUNTER — Ambulatory Visit: Payer: Medicare Other | Admitting: Internal Medicine

## 2018-11-23 DIAGNOSIS — R0602 Shortness of breath: Secondary | ICD-10-CM | POA: Diagnosis not present

## 2018-11-23 LAB — MYOCARDIAL PERFUSION IMAGING
CSEPPHR: 65 {beats}/min
LV dias vol: 90 mL (ref 46–106)
LV sys vol: 40 mL
Rest HR: 51 {beats}/min
SDS: 1
SRS: 0
SSS: 1
TID: 1.17

## 2018-11-23 MED ORDER — TECHNETIUM TC 99M TETROFOSMIN IV KIT
31.0000 | PACK | Freq: Once | INTRAVENOUS | Status: AC | PRN
  Administered 2018-11-23: 31 via INTRAVENOUS
  Filled 2018-11-23: qty 31

## 2018-11-23 MED ORDER — AMINOPHYLLINE 25 MG/ML IV SOLN
75.0000 mg | Freq: Once | INTRAVENOUS | Status: AC
  Administered 2018-11-23: 75 mg via INTRAVENOUS

## 2018-11-23 MED ORDER — TECHNETIUM TC 99M TETROFOSMIN IV KIT
10.0000 | PACK | Freq: Once | INTRAVENOUS | Status: AC | PRN
  Administered 2018-11-23: 10 via INTRAVENOUS
  Filled 2018-11-23: qty 10

## 2018-11-23 MED ORDER — REGADENOSON 0.4 MG/5ML IV SOLN
0.4000 mg | Freq: Once | INTRAVENOUS | Status: AC
  Administered 2018-11-23: 0.4 mg via INTRAVENOUS

## 2018-11-27 ENCOUNTER — Ambulatory Visit
Admission: RE | Admit: 2018-11-27 | Discharge: 2018-11-27 | Disposition: A | Discharge status: Home / Self Care | Payer: Medicare Other | Source: Ambulatory Visit | Attending: Physician Assistant | Admitting: Physician Assistant

## 2018-11-27 DIAGNOSIS — J849 Interstitial pulmonary disease, unspecified: Secondary | ICD-10-CM

## 2018-11-27 DIAGNOSIS — R918 Other nonspecific abnormal finding of lung field: Secondary | ICD-10-CM | POA: Diagnosis not present

## 2018-11-27 DIAGNOSIS — R0602 Shortness of breath: Secondary | ICD-10-CM

## 2018-11-28 ENCOUNTER — Other Ambulatory Visit: Payer: Self-pay | Admitting: Physician Assistant

## 2018-11-28 DIAGNOSIS — Z720 Tobacco use: Secondary | ICD-10-CM

## 2018-11-28 DIAGNOSIS — R9389 Abnormal findings on diagnostic imaging of other specified body structures: Secondary | ICD-10-CM

## 2018-11-28 DIAGNOSIS — R911 Solitary pulmonary nodule: Secondary | ICD-10-CM

## 2018-11-29 ENCOUNTER — Encounter: Payer: Self-pay | Admitting: *Deleted

## 2018-11-29 ENCOUNTER — Ambulatory Visit (INDEPENDENT_AMBULATORY_CARE_PROVIDER_SITE_OTHER): Payer: Medicare Other | Admitting: Internal Medicine

## 2018-11-29 ENCOUNTER — Telehealth: Payer: Self-pay | Admitting: Physician Assistant

## 2018-11-29 ENCOUNTER — Other Ambulatory Visit: Payer: Self-pay

## 2018-11-29 ENCOUNTER — Encounter: Payer: Self-pay | Admitting: Internal Medicine

## 2018-11-29 VITALS — BP 160/70 | HR 57 | Ht 66.0 in | Wt 188.0 lb

## 2018-11-29 DIAGNOSIS — R300 Dysuria: Secondary | ICD-10-CM | POA: Diagnosis not present

## 2018-11-29 DIAGNOSIS — Z794 Long term (current) use of insulin: Secondary | ICD-10-CM | POA: Diagnosis not present

## 2018-11-29 DIAGNOSIS — E1151 Type 2 diabetes mellitus with diabetic peripheral angiopathy without gangrene: Secondary | ICD-10-CM

## 2018-11-29 DIAGNOSIS — Z006 Encounter for examination for normal comparison and control in clinical research program: Secondary | ICD-10-CM

## 2018-11-29 DIAGNOSIS — I779 Disorder of arteries and arterioles, unspecified: Secondary | ICD-10-CM | POA: Diagnosis not present

## 2018-11-29 MED ORDER — SEMAGLUTIDE(0.25 OR 0.5MG/DOS) 2 MG/1.5ML ~~LOC~~ SOPN: 0.5000 mg | PEN_INJECTOR | SUBCUTANEOUS | 5 refills | Status: DC

## 2018-11-29 MED ORDER — SULFAMETHOXAZOLE-TRIMETHOPRIM 800-160 MG PO TABS: 1.0000 | ORAL_TABLET | Freq: Two times a day (BID) | ORAL | 0 refills | Status: DC

## 2018-11-29 NOTE — Progress Notes (Signed)
Patient ID: [name], female   DOB: 09-Aug-1946, 73 y.o.   MRN: [id number]   HPI: [name] is a 73 y.o.-year-old female, referred by her cardiologist, Dr.Hochrein, for management of DM2, dx in ~2000, insulin-dependent since ~2005, uncontrolled, with complications (PVD - s/p stents and fem-pop bypass; h/o CVA x3; CKD stage 3b; DR; PN). She saw my colleague, Dr. Loanne Drilling many years ago. PCP: [name], PA  Reviewed latest HbA1c level: Lab Results  Component Value Date   HGBA1C 10.9 (H) 11/13/2018   HGBA1C 9.7 (H) 07/19/2018   HGBA1C 8.7 (H) 04/18/2018   HGBA1C 8.3 (H) 01/03/2018   HGBA1C 10.0 (H) 09/27/2017   HGBA1C 10.5 (H) 05/04/2017   HGBA1C 8.9 02/25/2017   HGBA1C 10.9 (H) 11/10/2016   HGBA1C 9.3 (H) 07/22/2016   HGBA1C 9.5 (H) 02/02/2016   Component     Latest Ref Rng & Units 07/22/2016  Glutamic Acid Decarb Ab     <5 IU/mL <5   Pt is on a regimen of: - Novolog 70/30 25 units 2x a day - now injects in hips and arms. Stomach hurts. - Cinnamon 1000 mg 2x a day She was on Invokana >> intolerance 2/2 weight loss and dehydration >> hospitalization. She was on Metformin and sulfonylurea at dx. She was also on Januvia at the same time with Invokana >> stopped along with Invokana   Pt checks her sugars 0-1x a day and they are - per review of her meter download: - am: 168-296 - 2h after b'fast: 326 - before lunch: 265 - 2h after lunch: 280, 286 - before dinner: 176, 244 - 2h after dinner: 231 - bedtime: n/c - nighttime: n/c Lowest sugar was 150; she has hypoglycemia awareness at 140.  Highest sugar was 300s.  Glucometer: [place]  Pt's meals are: - Breakfast: eggs or cheese sandwich - Lunch: half a sandwich, apple - Dinner 9-9:30 pm: chicken, veggies - Snacks: stopped cheese; now apples and pickles She works part-time, from 10 am - 2 pm Tue-Fri.  - + CKD stage 3b, last BUN/creatinine:  Lab Results  Component Value Date   BUN 16 11/13/2018   BUN 17  08/28/2018   CREATININE 1.59 (H) 11/13/2018   CREATININE 1.44 (H) 08/28/2018  On Olmesartan 40.  - + HL; last set of lipids: Lab Results  Component Value Date   CHOL 134 11/13/2018   HDL 40 (L) 11/13/2018   LDLCALC 73 11/13/2018   LDLDIRECT 146.4 01/30/2010   TRIG 131 11/13/2018   CHOLHDL 3.4 11/13/2018  On Crestor 40, omega 3 FA's 1200 mg daily.  - last eye exam was in 10/2017: + DR. she does have glaucoma.  - + numbness and tingling in her feet.  She has a callus on L foot.  She sees a podiatrist. She has intolerance to Lyrica.  She is on ASA 81.  Pt has FH of DM in sister.  She also has a history of nontoxic multinodular goiter.  Latest TSH was normal: Lab Results  Component Value Date   TSH 1.65 11/13/2018   Also, she has a history of HTN, brain aneurysm, emphysema, esophageal stricture, GERD.  She also takes the following supplements: Pain-8, fatigue-8, respiratory relief, memory.  The information about this will be scanned in patient's chart.  ROS: Constitutional: + weight gain, no weight loss, + fatigue, no subjective hyperthermia, no subjective hypothermia, + nocturia, + dysuria Eyes: no blurry vision, no xerophthalmia ENT: no sore throat, no nodules palpated in neck,  no dysphagia, no odynophagia, no hoarseness, no tinnitus, no hypoacusis Cardiovascular: no CP, + SOB, no palpitations, + leg swelling Respiratory: no cough, + SOB, + wheezing Gastrointestinal: no N, no V, no D, no C, no acid reflux Musculoskeletal: + muscle, + joint aches Skin: no rash, no hair loss, + easy bruising, + itching Neurological: + tremors, no numbness or tingling/no dizziness/+ HAs Psychiatric: + depression,+ anxiety  Past Medical History:  Diagnosis Date  . Abnormal findings on esophagogastroduodenoscopy (EGD) 07/2010  . Aneurysm [facility]) 2013   Right Brain   . Aneurysm ([place])    in brain x 2  . Anxiety   . Colon polyps   . Diabetes mellitus 1998  . Diverticulosis   . ESOPHAGEAL  STRICTURE 08/27/2008  . GERD (gastroesophageal reflux disease)   . Hemorrhoids   . Hiatal hernia   . Hyperlipidemia   . Hypertension 2000  . Migraines   . Peripheral vascular disease ([place])   . Phlebitis    30 years ago  left leg  . Stroke [facility])    multiple mini strokes ( brain aneurysm )   Past Surgical History:  Procedure Laterality Date  . ABDOMINAL AORTAGRAM N/A 01/04/2012   Procedure: ABDOMINAL Maxcine Ham;  Surgeon: Serafina Mitchell, MD;  Location: [facility] CATH LAB;  Service: Cardiovascular;  Laterality: N/A;  . ABDOMINAL AORTAGRAM N/A 08/15/2012   Procedure: ABDOMINAL Maxcine Ham;  Surgeon: Serafina Mitchell, MD;  Location: [facility] CATH LAB;  Service: Cardiovascular;  Laterality: N/A;  . ABDOMINAL HYSTERECTOMY  1984  . cataract surgery Right 10-22-2015  . cataract surgery Left 11-05-2015  . CESAREAN SECTION    . CHOLECYSTECTOMY    . COLONOSCOPY  07/2010  . DENTAL SURGERY  Aug. 16, 2013   left lower   . ESOPHAGOGASTRODUODENOSCOPY  07/2010  . EYE SURGERY  Nov. 2014   Laser-Glaucoma  . FEMORAL ARTERY STENT  05/11/11   Left superficial femoral and popliteal artery  . HERNIA REPAIR     times two  . KNEE SURGERY    . LOWER EXTREMITY ANGIOGRAM Left 08/15/2012   Procedure: LOWER EXTREMITY ANGIOGRAM;  Surgeon: Serafina Mitchell, MD;  Location: [facility] CATH LAB;  Service: Cardiovascular;  Laterality: Left;  lt leg angio  . rotator cuff surgery    . THYROID SURGERY     Social History   Socioeconomic History  . Marital status: Married    Spouse name: Not on file  . Number of children: 2  . Years of education: Not on file  . Highest education level: Not on file  Occupational History  . 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 Financial resource strain: Not on file  . Food insecurity:    Worry: Not on file    Inability: Not on file  . Transportation needs:    Medical: Not on file    Non-medical: Not on file  Tobacco Use  . Smoking status: Current Every Day Smoker     Packs/day: 0.25    Years: 40.00    Pack years: 10.00    Types: Cigarettes  . Smokeless tobacco: Never Used  Substance and Sexual Activity  . Alcohol use: No    Alcohol/week: 0.0 standard drinks  . Drug use: No  . Sexual activity: Not on file  Lifestyle  . Physical activity:    Days per week: Not on file    Minutes per session: Not on file  . Stress: Not on file  Relationships  . Social  connections:    Talks on phone: Not on file    Gets together: Not on file    Attends religious service: Not on file    Active member of club or organization: Not on file    Attends meetings of clubs or organizations: Not on file    Relationship status: Not on file  . Intimate partner violence:    Fear of current or ex partner: Not on file    Emotionally abused: Not on file    Physically abused: Not on file    Forced sexual activity: Not on file  Other Topics Concern  . Not on file  Social History Narrative   Lives with husband.        Current Outpatient Medications on File Prior to Visit  Medication Sig Dispense Refill  . amLODipine (NORVASC) 10 MG tablet TAKE 1 TABLET BY MOUTH EVERY DAY 90 tablet 1  . aspirin EC 81 MG tablet Take 81 mg by mouth at bedtime.     . bisoprolol-hydrochlorothiazide (ZIAC) 5-6.25 MG tablet TAKE 1/2-1 TABLET BY MOUTH DAILY 90 tablet 0  . Blood Glucose Monitoring Suppl (ACCU-CHEK AVIVA PLUS) W/DEVICE KIT Check blood sugar up to three times daily 1 kit 0  . Cholecalciferol (VITAMIN D PO) Take 5,000 Units by mouth daily.    Marland Kitchen CINNAMON PO Take 1,000 mg by mouth 2 (two) times daily.     . citalopram (CELEXA) 40 MG tablet TAKE 1 TABLET DAILY FOR MOOD & ANXIETY 90 tablet 1  . clonazePAM (KLONOPIN) 0.5 MG tablet Take 1/2 to 1 tablet at Bedtime ONLY if Needed & please try to limit to 5 days /week to avoid addiction 90 tablet 0  . Continuous Blood Gluc Sensor (FREESTYLE LIBRE SENSOR SYSTEM) MISC Check sugars 3 x a day DX E10.9 1 each 0  . glucose blood (ACCU-CHEK AVIVA  PLUS) test strip Use daily to check BS TID Dx. E11.22 300 each 1  . insulin NPH-regular Human (NOVOLIN 70/30) (70-30) 100 UNIT/ML injection INJECT 25 UNITS IN THE AM [place] WITH FOOD, AND INJECT 25 UNITS [place] WITH EVENING MEAL. 50 mL 2  . Insulin Syringe-Needle U-100 31G X 15/64" 0.5 ML MISC Use two pens daily with insulin 100 each 3  . Magnesium 500 MG TABS Take 500 mg by mouth daily.    Marland Kitchen olmesartan (BENICAR) 40 MG tablet TAKE 1 TABLET DAILY FOR BLOOD PRESSURE 90 tablet 1  . Omega-3 Fatty Acids (FISH OIL) 1200 MG CAPS Take 1,200 capsules by mouth daily.     Marland Kitchen omeprazole (PRILOSEC) 40 MG capsule TAKE 1 CAPSULE BY MOUTH EVERY DAY 90 capsule 2  . OVER THE COUNTER MEDICATION Take 1 tablet by mouth daily. One a day vitamin Woman's one a day    . rosuvastatin (CRESTOR) 40 MG tablet Take 40 mg by mouth daily.    . vitamin E 400 UNIT capsule Take 400 Units by mouth daily.     No current facility-administered medications on file prior to visit.    Allergies  Allergen Reactions  . Ciprofloxacin Other (See Comments)  . Plavix [Clopidogrel Bisulfate] Palpitations  . Amoxicillin Itching and Swelling    FACE & EYES SWELL  . Ace Inhibitors   . Ciprofloxacin Hcl Swelling  . Lyrica [Pregabalin]     Itch, gain weight  . Penicillins Other (See Comments)    REACTION: unspecified   Family History  Problem Relation Age of Onset  . CAD Mother 45  Died of MI  . Hypertension Mother   . Heart attack Mother   . Heart disease Mother   . CAD Father 66       Died of MI  . Heart disease Father   . Heart attack Father   . CAD Brother 55       Two brothers died of MI  . Heart attack Brother   . Heart disease Brother        Amputation  . Diabetes Sister   . Hypertension Sister   . Heart attack Brother   . Heart disease Brother   . Colon cancer Neg Hx   . Stomach cancer Neg Hx   . Esophageal cancer Neg Hx     PE: BP (!) 160/70   Pulse (!) 57   Ht 5' 6"  (1.676 m) Comment: measured  Wt 188 lb  (85.3 kg)   SpO2 93%   BMI 30.34 kg/m  Wt Readings from Last 3 Encounters:  11/29/18 188 lb (85.3 kg)  11/23/18 187 lb (84.8 kg)  11/17/18 187 lb 6.4 oz (85 kg)   Constitutional: overweight, in NAD Eyes: PERRLA, EOMI, no exophthalmos ENT: moist mucous membranes, no thyromegaly, no cervical lymphadenopathy Cardiovascular: RRR, No MRG Respiratory: CTA B Gastrointestinal: abdomen soft, NT, ND, BS+ Musculoskeletal: no deformities, strength intact in all 4 Skin: moist, warm, no rashes Neurological: no tremor with outstretched hands, DTR normal in all 4  ASSESSMENT: 1. DM2, insulin-dependent, uncontrolled, with complications - PVD - s/p stents and fem-pop bypass - h/o CVA - CKD stage 3b - DR - PN  2. Dysuria  PLAN:  1. Patient with long-standing, uncontrolled diabetes, on premixed insulin regimen, which became insufficient. Latest HbA1c from last month was very high, at 10.9%. -At this visit, we reviewed together her meter downloads.  Her sugars are very high, increased compared to the end of last year.  She is now sure why her sugars are higher.  Reviewing her diet, this is high fat and also high salt.  We discussed about the need to reduce the salt to improve her blood pressure, which today is high, at 160/70 despite the use of 3 antihypertensive medications.  Also, we discussed about the absolute need to reduce the fat in her diet to improve her insulin resistance.  I explained the mechanism of insulin resistance and how purging her diet of high fat foods can help her blood sugars.  She is interested in coming off the insulin but we discussed that I am not sure if this is possible due to the long duration of her insulin treatment and a very high sugars.  However, when sugars improved, we will need to check her insulin production.  For this, her glucose has to be lower than 180.  I will plan to check these labs at next visit. -For now, we discussed about continuing the NovoLog 70/30,  however, since this is not enough, I suggested addition of a GLP-1 receptor agonist.  I explained benefits of this medication on cardiovascular outcomes.  We also discussed about SGLT 2 inhibitors, but due to urinary symptoms at the moment, I would not suggest these.  Also, she had extreme weight loss and dehydration from [place] in the past.  I do not feel Invokana is a good option for her due to severe PVD.  However, we may reconsider Jardiance or Wilder Glade in the future. -I will send an Ozempic prescription to her pharmacy and we discussed about starting at the low dose  and advancing as tolerated.  We discussed about possible side effects.  Also, discussed about the possible weight loss on the medication.  She is excited about this. - I suggested to:  Patient Instructions  Please continue: - Novolog 70/30 25 units 2x a day 15 min before meals  Please start Ozempic 0.25 mg weekly in a.m. (for example on Sunday morning) x 4 weeks, then increase to 0.5 mg weekly in a.m. if no nausea or hypoglycemia.  Please let me know if the sugars are consistently <80 or >200.  Please return in 2 months with your sugar log.   - Strongly advised her to start checking sugars at different times of the day - check 2x a day, rotating checks - discussed about CBG targets for treatment: 80-130 mg/dL before meals and <180 mg/dL after meals; target HbA1c <7%. - given sugar log and advised how to fill it and to bring it at next appt  - given foot care handout and explained the principles  - given instructions for hypoglycemia management "15-15 rule"  - advised for yearly eye exams  - Return to clinic in 2 mo with sugar log  2. Dysuria -At this visit, patient complains of dysuria and urinary frequency. -We will check a urinalysis with reflex urine culture -Patient has had problems with UTIs in the past and she has intolerance to ciprofloxacin.   -She will call PCP for further instructions about  treatment  Component     Latest Ref Rng & Units 11/29/2018  Color, Urine     YELLOW DARK YELLOW  Appearance     CLEAR CLOUDY (A)  Specific Gravity, Urine     1.001 - 1.03 1.028  pH     5.0 - 8.0 < OR = 5.0  Glucose, UA     NEGATIVE 2+ (A)  Bilirubin Urine     NEGATIVE NEGATIVE  Ketones, ur     NEGATIVE NEGATIVE  Hgb urine dipstick     NEGATIVE 3+ (A)  Protein     NEGATIVE 3+ (A)  Nitrites, Initial     NEGATIVE NEGATIVE  Leukocyte Esterase     NEGATIVE 1+ (A)  WBC, UA     0 - 5 /HPF > OR = 60 (A)  RBC / HPF     0 - 2 /HPF 10-20 (A)  Squamous Epithelial / LPF     < OR = 5 /HPF 6-10 (A)  Bacteria, UA     NONE SEEN /HPF FEW (A)  Hyaline Cast     NONE SEEN /LPF 6-10 (A)  Granular Cast     NONE SEEN /LPF 1-3 (A)  Nitrite     NEGATIVE   Leukocytes, UA     NEGATIVE   Mucus        Urobilinogen, UA     0.0 - 1.0 mg/dL   Crystals     NONE SEEN HPF   Casts     NONE SEEN LPF   Yeast     NONE SEEN HPF   REFLEXIVE URINE CULTURE      CULTURE INDICATED - RESULTS TO FOLLOW   UTI.  Culture pending.  Bactrim called in yesterday by PCP.  Philemon Kingdom, MD PhD [facility] Endocrinology

## 2018-11-29 NOTE — Research (Signed)
COORDINATE-Diabetes BASELINE CASE REPORT FORM (Intervention) Site #:  [id number]              Patient ID:      [id number]            INCLUSION CRITERIA  1. Is patient 18 years or older? [] No   [x] Yes  2. Based on the clinical record, does the patient have a history of type 2 diabetes mellitus? [] No   [x] Yes  3. Based on the clinical record, does the patient have a history of atherosclerotic cardiovascular disease, as defined by at least one of the clinical criteria below? [] No   [x] Yes   IF YES: Coronary Artery Disease Prior myocardial infarction Prior coronary artery bypass graft surgery Prior percutaneous coronary intervention Documented obstructive (i.e. ?50%) coronary artery disease (by angiography or CT) [x] No  [] Yes [x] No  [] Yes [x] No  [] Yes [x] No  [] Yes IF YES: date of MOST RECENT event: / /          mm dd yyyy   Cerebrovascular Disease Prior ischemic stroke Carotid artery stenosis (?50%) [] No [x]  Yes [x] No []  Yes IF YES: date of MOST RECENT event:   10/04/2014             mm dd yyyy   Peripheral Arterial Disease Prior peripheral revascularization Prior amputation due to poor circulation History of Claudication with Ankle-brachial index <0.9 [] No [x]  Yes [x] No []  Yes [] No [x]  Yes IF YES: date of MOST RECENT event: 05/11/2011  mm dd yyyy  4. Patient's baseline guideline-based therapy score:    1 point Currently prescribed angiotensin converting enzyme inhibitor (ACEi), angiotensin receptor blocker (ARB) or Angiotensin Receptor Neprilysin inhibitor (ARNi) therapy [] No [x]  Yes   1 point Currently prescribed high intensity statin therapy (atorvastatin 40-80mg daily OR rosuvastatin 20-40mg daily) [] No [x]  Yes   1 point Most recent HbA1c <7% on metformin monotherapy? [x] No []  Yes    Calculated score:    EXCLUSION CRITERIA  REVIEW EACH CONDITION AND SELECT YES IF THE STATEMENT IS TRUE OR NO IF THE STATEMENT IS FALSE.  1. Determined to be highly unlikely to survive and/or to continue  follow-up in that clinic for at least 1 year, as identified by site investigator [x] No [] Yes  2. eGFR ? 30 ml/min/1.4m [x] No [] Yes  3. Baseline composite score of 3 for guideline recommended therapy [x] No [] Yes  4. Absolute contraindication to any of the 3 guideline recommended therapies    ACEi AND ARB therapy [x] No []  Yes    High intensity statin therapy [x] No  [] Yes    SGLT2I AND GLP1RA therapy [x] No  [] Yes   5. Already taking SGLT2i or GLP1RA therapy at baseline [x] No [] Yes     Subject Name: [name] Subject met inclusion and exclusion criteria.  The informed consent form, study requirements and expectations were reviewed with the subject and questions and concerns were addressed prior to the signing of the consent form.  The subject verbalized understanding of the trial requirements.  The subject agreed to participate in the [place] [facility] trial and signed the informed consent on 11/29/18 at 0920.The informed consent was obtained prior to performance of any protocol-specific procedures for the subject.  A copy of the signed informed consent was given to the subject and a copy was placed in the subject's medical record.   [name]   MEDICAL HISTORY  Other Cardiovascular  Atrial Fibrillation or Atrial Flutter [x] No [] Yes  Hypertension [] No [x] Yes  Hyperlipidemia [] No [x] Yes  Cigarette smoking []   Current [x] Former [] Never  Non-Cardiovascular  Anemia (Hgb<lower limit normal) [x] No [] Yes  Asthma [x] No [] Yes  Chronic lung disease [x] No [] Yes  Cancer - leukemia, lymphoma, or localized solid tumor [x] No [] Yes  Cancer - metastatic solid tumor [x] No [] Yes  Mild Liver Disease (Cirrhosis without portal hypertension) [x] No [] Yes  Moderate/Severe Liver Disease (Cirrhosis with portal hypertension) [x] No [] Yes  Connective Tissue Disease [x] No [] Yes  Dementia [x] No [] Yes  Depression [x] No [] Yes  Hemiplegia or paraplegia [x] No [] Yes  Human immunodeficiency virus [x] No  [] Yes  Obstructive Sleep Apnea [x] No [] Yes  Peptic Ulcer Disease [x] No [] Yes  Renal insufficiency [x] No [] Yes  Drug or Alcohol Abuse [] Current [] Former [x] Never  Diabetes History  Year of diagnosis of type 2 diabetes 1998  History of diabetic ketoacidosis? [x] No [] Yes   Are any of the following complications of diabetes present (Select all that apply): [] Retinopathy [x] Neuropathy [x] Foot complications      (including ulcers, calluses,         amputation) [] Gastroparesis [] None of the above        [] No [x] Yes  Subject educated on Coordinate diabetes and all educational materials given to subject.

## 2018-11-29 NOTE — Telephone Encounter (Signed)
Message left for patient to return my call.I will continue to try later. Message left with husband that rx was sent as requested by the patient. March 11th 2020

## 2018-11-29 NOTE — Telephone Encounter (Signed)
-----   Message from Elenor Quinones, [place] sent at 11/29/2018 12:22 PM EDT ----- Regarding: ABX Contact: [phone] PER YELLOW OFFICE NOTE:   Pt reports   Left urine sample when she saw Dr. Cruzita Lederer this morning due to burning when voiding. Dr. Renne Crigler told her to call her PCP for Rx.  Pharmacy: [facility] [address]

## 2018-11-29 NOTE — Patient Instructions (Signed)
Please continue: - Novolog 70/30 25 units 2x a day 15 min before meals  Please start Ozempic 0.25 mg weekly in a.m. (for example on Sunday morning) x 4 weeks, then increase to 0.5 mg weekly in a.m. if no nausea or hypoglycemia.  Please let me know if the sugars are consistently <80 or >200.  Please return in 2 months with your sugar log.   PATIENT INSTRUCTIONS FOR TYPE 2 DIABETES:  **Please join MyChart!** - see attached instructions about how to join if you have not done so already.  DIET AND EXERCISE Diet and exercise is an important part of diabetic treatment.  We recommended aerobic exercise in the form of brisk walking (working between 40-60% of maximal aerobic capacity, similar to brisk walking) for 150 minutes per week (such as 30 minutes five days per week) along with 3 times per week performing 'resistance' training (using various gauge rubber tubes with handles) 5-10 exercises involving the major muscle groups (upper body, lower body and core) performing 10-15 repetitions (or near fatigue) each exercise. Start at half the above goal but build slowly to reach the above goals. If limited by weight, joint pain, or disability, we recommend daily walking in a swimming pool with water up to waist to reduce pressure from joints while allow for adequate exercise.    BLOOD GLUCOSES Monitoring your blood glucoses is important for continued management of your diabetes. Please check your blood glucoses 2-4 times a day: fasting, before meals and at bedtime (you can rotate these measurements - e.g. one day check before the 3 meals, the next day check before 2 of the meals and before bedtime, etc.).   HYPOGLYCEMIA (low blood sugar) Hypoglycemia is usually a reaction to not eating, exercising, or taking too much insulin/ other diabetes drugs.  Symptoms include tremors, sweating, hunger, confusion, headache, etc. Treat IMMEDIATELY with 15 grams of Carbs: . 4 glucose tablets .  cup regular  juice/soda . 2 tablespoons raisins . 4 teaspoons sugar . 1 tablespoon honey Recheck blood glucose in 15 mins and repeat above if still symptomatic/blood glucose <100.  RECOMMENDATIONS TO REDUCE YOUR RISK OF DIABETIC COMPLICATIONS: * Take your prescribed MEDICATION(S) * Follow a DIABETIC diet: Complex carbs, fiber rich foods, (monounsaturated and polyunsaturated) fats * AVOID saturated/trans fats, high fat foods, >2,300 mg salt per day. * EXERCISE at least 5 times a week for 30 minutes or preferably daily.  * DO NOT SMOKE OR DRINK more than 1 drink a day. * Check your FEET every day. Do not wear tightfitting shoes. Contact us if you develop an ulcer * See your EYE doctor once a year or more if needed * Get a FLU shot once a year * Get a PNEUMONIA vaccine once before and once after age 39 years  GOALS:  * Your Hemoglobin A1c of <7%  * fasting sugars need to be <130 * after meals sugars need to be <180 (2h after you start eating) * Your Systolic BP should be 161 or lower  * Your Diastolic BP should be 80 or lower  * Your HDL (Good Cholesterol) should be 40 or higher  * Your LDL (Bad Cholesterol) should be 100 or lower. * Your Triglycerides should be 150 or lower  * Your Urine microalbumin (kidney function) should be <30 * Your Body Mass Index should be 25 or lower    Please consider the following ways to cut down carbs and fat and increase fiber and micronutrients in your diet: - substitute  whole grain for white bread or pasta - substitute brown rice for white rice - substitute 90-calorie flat bread pieces for slices of bread when possible - substitute sweet potatoes or yams for white potatoes - substitute humus for margarine - substitute tofu for cheese when possible - substitute almond or rice milk for regular milk (would not drink soy milk daily due to concern for soy estrogen influence on breast cancer risk) - substitute dark chocolate for other sweets when possible -  substitute water - can add lemon or orange slices for taste - for diet sodas (artificial sweeteners will trick your body that you can eat sweets without getting calories and will lead you to overeating and weight gain in the long run) - do not skip breakfast or other meals (this will slow down the metabolism and will result in more weight gain over time)  - can try smoothies made from fruit and almond/rice milk in am instead of regular breakfast - can also try old-fashioned (not instant) oatmeal made with almond/rice milk in am - order the dressing on the side when eating salad at a restaurant (pour less than half of the dressing on the salad) - eat as little meat as possible - can try juicing, but should not forget that juicing will get rid of the fiber, so would alternate with eating raw veg./fruits or drinking smoothies - use as little oil as possible, even when using olive oil - can dress a salad with a mix of balsamic vinegar and lemon juice, for e.g. - use agave nectar, stevia sugar, or regular sugar rather than artificial sweateners - steam or broil/roast veggies  - snack on veggies/fruit/nuts (unsalted, preferably) when possible, rather than processed foods - reduce or eliminate aspartame in diet (it is in diet sodas, chewing gum, etc) Read the labels!  Try to read Dr. Janene Harvey book: "Program for Reversing Diabetes" for other ideas for healthy eating.

## 2018-12-01 LAB — URINALYSIS W MICROSCOPIC + REFLEX CULTURE
Bilirubin Urine: NEGATIVE
Ketones, ur: NEGATIVE
Nitrites, Initial: NEGATIVE
Specific Gravity, Urine: 1.028 (ref 1.001–1.03)
WBC, UA: >=60 /HPF — AB (ref 0–5)
pH: <=5 (ref 5.0–8.0)

## 2018-12-01 LAB — URINE CULTURE
MICRO NUMBER: 311077
SPECIMEN QUALITY: ADEQUATE

## 2018-12-01 LAB — CULTURE INDICATED

## 2018-12-03 ENCOUNTER — Other Ambulatory Visit: Payer: Self-pay | Admitting: Adult Health

## 2018-12-05 ENCOUNTER — Ambulatory Visit: Payer: Medicare Other | Admitting: Podiatry

## 2018-12-06 ENCOUNTER — Telehealth: Payer: Self-pay | Admitting: Physician Assistant

## 2018-12-06 DIAGNOSIS — J439 Emphysema, unspecified: Secondary | ICD-10-CM

## 2018-12-06 NOTE — Telephone Encounter (Signed)
-----   Message from Ned Clines sent at 12/05/2018  3:21 PM EDT ----- Our office has set this patient up for an appointment with Dr Servando Snare on 3/26 @ 12:30pm however he would like your office to order full PFT's before this appointment if the patient has not had this test.  Thank you.  Jenny Reichmann TCTS [phone]

## 2018-12-07 ENCOUNTER — Other Ambulatory Visit: Payer: Self-pay | Admitting: Physician Assistant

## 2018-12-07 DIAGNOSIS — J439 Emphysema, unspecified: Secondary | ICD-10-CM

## 2018-12-07 NOTE — Progress Notes (Signed)
Assessment and Plan:  Abnormal CT of the chest -     NM PET Image Initial (PI) Skull Base To Thigh; Future - will try to set up PET imaging, discussed with patient, has appointment Thursday with cardiothoracic surgery  Urinary frequency -     Urinalysis, Routine w reflex microscopic -     Urine Culture  CKD stage 3 due to type 2 diabetes mellitus ([place]) Continue ozempic  Tobacco abuse Smoking cessation-  instruction/counseling given, counseled patient on the dangers of tobacco use, advised patient to stop smoking, and reviewed strategies to maximize success, patient not ready to quit at this time.   Pulmonary emphysema, unspecified emphysema type ([place]) Advised to stop smoking - risks discussed. Declines medications.  Will get PFTs    Future Appointments  Date Time Provider [place]  12/14/2018 12:30 PM Grace Isaac, MD [facility]  01/01/2019  3:45 PM Marzetta Board, DPM [facility]  01/29/2019 11:15 AM Philemon Kingdom, MD [facility] None  02/05/2019 10:30 AM Leta Baptist, Earlean Polka, MD [facility] None  02/26/2019 10:40 AM Minus Breeding, MD [facility]  05/14/2019 10:00 AM [name], MD [facility] None  11/28/2019 10:00 AM [name], PA-C [facility] None     HPI 73 y.o.female presents for 4 week follow up from her medicare wellness visit.   Blood pressure 124/72, pulse 68, temperature (!) 97 F (36.1 C), resp. rate 16, height 5' 6" (1.676 m), weight 185 lb 9.6 oz (84.2 kg).   She has history of smoking, currently still smoking, history of poorly controlled DM, non compliance, PAD, CAD, stroke, COPD and was very frustrated that everyone was blaming her conditions on her smoking or her diabetes. I spent a very long time explaining disease pathophysiology of atherosclerosis with her. She was started on ozempic and following with Dr. Darnell Level. She also had a UTI 03/11, treated with bactrim, continues to have pressure and urinary  frequency with only a drop or two.   She was complaining of shortness of breath at the time. She was given anoro samples and got a CT scan follow up from a previous CT scan, this was abnormal and she has an appointment with the thoracic board. Will order a PFT and discuss a PET scan today.  She was also referred to cardiology to rule out possible CAD etiology since she is very high risk, had normal stress test 11/23/2018.    CT chest 11/27/2018 IMPRESSION: Region of ground-glass opacity in the medial left upper lobe, slowly enlarging and becoming more conspicuous. Adenocarcinoma cannot be excluded. Referral to the multi disciplinary chest clinic would be suggested.  Other small pulmonary nodules are stable and likely benign. Old calcified granuloma in the right lower lobe with calcified nodes. Centrilobular emphysema, slightly progressive over time.  Aortic atherosclerosis and coronary artery calcification.  Past Medical History:  Diagnosis Date  . Abnormal findings on esophagogastroduodenoscopy (EGD) 07/2010  . 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 Anxiety   . Colon polyps   . Diabetes mellitus 1998  . Diverticulosis   . ESOPHAGEAL STRICTURE 08/27/2008  . GERD (gastroesophageal reflux disease)   . Hemorrhoids   . Hiatal hernia   . Hyperlipidemia   . Hypertension 2000  . Migraines   . Peripheral vascular disease ([place])   . Phlebitis    30 years ago  left leg  . Stroke [facility])    multiple mini strokes (  brain aneurysm )     Allergies  Allergen Reactions  . Ciprofloxacin Other (See Comments)  . Plavix [Clopidogrel Bisulfate] Palpitations  . Amoxicillin Itching and Swelling    FACE & EYES SWELL  . Ace Inhibitors   . Ciprofloxacin Hcl Swelling  . Lyrica [Pregabalin]     Itch, gain weight  . Penicillins Other (See Comments)    REACTION: unspecified    Current Outpatient Medications on File Prior to Visit  Medication Sig  .  amLODipine (NORVASC) 10 MG tablet TAKE 1 TABLET BY MOUTH EVERY DAY  . aspirin EC 81 MG tablet Take 81 mg by mouth at bedtime.   . bisoprolol-hydrochlorothiazide (ZIAC) 5-6.25 MG tablet Take 1 tablet Daily for BP  . Blood Glucose Monitoring Suppl (ACCU-CHEK AVIVA PLUS) W/DEVICE KIT Check blood sugar up to three times daily  . Cholecalciferol (VITAMIN D PO) Take 5,000 Units by mouth daily.  Marland Kitchen CINNAMON PO Take 1,000 mg by mouth 2 (two) times daily.   . citalopram (CELEXA) 40 MG tablet TAKE 1 TABLET DAILY FOR MOOD & ANXIETY  . clonazePAM (KLONOPIN) 0.5 MG tablet Take 1/2 to 1 tablet at Bedtime ONLY if Needed & please try to limit to 5 days /week to avoid addiction  . Continuous Blood Gluc Sensor (FREESTYLE LIBRE SENSOR SYSTEM) MISC Check sugars 3 x a day DX E10.9  . glucose blood (ACCU-CHEK AVIVA PLUS) test strip Use daily to check BS TID Dx. E11.22  . insulin NPH-regular Human (NOVOLIN 70/30) (70-30) 100 UNIT/ML injection INJECT 25 UNITS IN THE AM [place] WITH FOOD, AND INJECT 25 UNITS  WITH EVENING MEAL.  Marland Kitchen Insulin Syringe-Needle U-100 31G X 15/64" 0.5 ML MISC Use two pens daily with insulin  . Magnesium 500 MG TABS Take 500 mg by mouth daily.  Marland Kitchen olmesartan (BENICAR) 40 MG tablet TAKE 1 TABLET DAILY FOR BLOOD PRESSURE  . Omega-3 Fatty Acids (FISH OIL) 1200 MG CAPS Take 1,200 capsules by mouth daily.   Marland Kitchen omeprazole (PRILOSEC) 40 MG capsule TAKE 1 CAPSULE BY MOUTH EVERY DAY  . OVER THE COUNTER MEDICATION Take 1 tablet by mouth daily. One a day vitamin Woman's one a day  . rosuvastatin (CRESTOR) 40 MG tablet Take 40 mg by mouth daily.  . Semaglutide,0.25 or 0.5MG/DOS, (OZEMPIC, 0.25 OR 0.5 MG/DOSE,) 2 MG/1.5ML SOPN Inject 0.5 mg into the skin once a week.  . vitamin E 400 UNIT capsule Take 400 Units by mouth daily.   No current facility-administered medications on file prior to visit.     ROS: all negative except above.   Physical Exam: Filed Weights   12/11/18 1530  Weight: 185 lb 9.6 oz (84.2  kg)   BP 124/72   Pulse 68   Temp (!) 97 F (36.1 C)   Resp 16   Ht 5' 6" (1.676 m)   Wt 185 lb 9.6 oz (84.2 kg)   BMI 29.96 kg/m  General Appearance: Well nourished, in no apparent distress. Eyes: PERRLA, EOMs, conjunctiva no swelling or erythema Sinuses: No Frontal/maxillary tenderness ENT/Mouth: Ext aud canals clear, TMs without erythema, bulging. No erythema, swelling, or exudate on post pharynx.  Tonsils not swollen or erythematous. Hearing normal.  Neck: Supple, thyroid normal.  Respiratory: Respiratory effort normal, BS equal bilaterally without rales, rhonchi, wheezing or stridor.  Cardio: RRR with no MRGs. Brisk peripheral pulses without edema.  Abdomen: Soft, + BS.  Non tender, no guarding, rebound, hernias, masses. Lymphatics: Non tender without lymphadenopathy.  Musculoskeletal: Full ROM, 5/5 strength,  normal gait.  Skin: Warm, dry without rashes, lesions, ecchymosis.  Neuro: Cranial nerves intact. Normal muscle tone, no cerebellar symptoms. Sensation intact.  Psych: Awake and oriented X 3, normal affect, Insight and Judgment appropriate.     [name], PA-C 4:10 PM [facility]

## 2018-12-11 ENCOUNTER — Ambulatory Visit (INDEPENDENT_AMBULATORY_CARE_PROVIDER_SITE_OTHER): Payer: Medicare Other | Admitting: Physician Assistant

## 2018-12-11 ENCOUNTER — Other Ambulatory Visit: Payer: Self-pay

## 2018-12-11 VITALS — BP 124/72 | HR 68 | Temp 97.0°F | Resp 16 | Ht 66.0 in | Wt 185.6 lb

## 2018-12-11 DIAGNOSIS — J439 Emphysema, unspecified: Secondary | ICD-10-CM

## 2018-12-11 DIAGNOSIS — N183 Chronic kidney disease, stage 3 unspecified: Secondary | ICD-10-CM

## 2018-12-11 DIAGNOSIS — Z720 Tobacco use: Secondary | ICD-10-CM | POA: Diagnosis not present

## 2018-12-11 DIAGNOSIS — R9389 Abnormal findings on diagnostic imaging of other specified body structures: Secondary | ICD-10-CM | POA: Diagnosis not present

## 2018-12-11 DIAGNOSIS — R350 Frequency of micturition: Secondary | ICD-10-CM | POA: Diagnosis not present

## 2018-12-11 DIAGNOSIS — I779 Disorder of arteries and arterioles, unspecified: Secondary | ICD-10-CM

## 2018-12-11 DIAGNOSIS — E1122 Type 2 diabetes mellitus with diabetic chronic kidney disease: Secondary | ICD-10-CM | POA: Diagnosis not present

## 2018-12-11 NOTE — Patient Instructions (Addendum)
We are introducing Virtual office visits!  For your convenience, you can now have a virtual video visit for your regular follow up or as an acute office visit for non-emergency medical issues. Use a computer, smart phone or I pad with a good Internet connection and webcam.   There will be a copay for your E-visit, however, they are less expensive than an urgent care or emergency room visit and you bypass the hassle of a waiting room. E-visits/telephone calls may result in your deductible, your copay, or your coinsurance per your insurance policy.   We will set up a PFT and PET scan for you due to your CT scan   PET Scan A PET scan (positron emission tomography) is a test that creates pictures of the inside of your body. For the test, a small amount of radioactive material is injected into a vein. A special scanner then takes pictures of your body. The pictures created during a PET scan can be used to study diseases, like cancer. The colors and brightness on the pictures show different levels of organ and tissue function. For example, cancer tissue appears brighter than normal tissue on a PET scan image. Tell a health care provider about:  Any allergies you have.  All medicines you are taking, including vitamins, herbs, eye drops, creams, and over-the-counter medicines.  Any blood disorders you have.  Any surgeries you have had.  Any medical conditions you have.  If you are afraid of cramped spaces (claustrophobic). If claustrophobia is a problem, it usually can be relieved with a medicine to help you relax (sedative) or a medicine to treat anxiety.  If you have trouble staying still for long periods of time.  Whether you are pregnant or may be pregnant. What are the risks? Generally, this is a safe test. However, problems may occur, including:  Bleeding, pain, or swelling at the injection site.  Allergic reactions to the radioactive material. This is rare. What happens before the  procedure?  Do not eat or drink anything after midnight on the night before the procedure, or as directed by your health care provider.  Take medicines only as directed by your health care provider.  Tell your health care provider if you are pregnant or breastfeeding.  If you have diabetes, ask your health care provider for diet guidelines to control your blood sugar (glucose) levels on the day of the test. What happens during the procedure?  An IV will be inserted into one of your veins.  A small amount of radioactive material will be injected into a vein.  You will wait 30-60 minutes after the injection. This allows the material to travel through your body.  You will lie on a cushioned table, and the table will be moved through the center of an imaging machine similar to a CT scanner.  Pictures of your body will be taken. It will take about 30-60 minutes for the machine to produce the pictures. You will need to stay very still during this time. What happens after the procedure?   Ask your health care provider, or the department that is doing the test: ? When will my results be ready? ? How will I get my results? ? What are my treatment options? ? What other tests do I need? ? What are my next steps?  You may resume your normal diet and activities.  Drink 6-8 glasses of water after the test to flush the radioactive material out of your body. Drink enough fluid  to keep your urine pale yellow. Summary  A PET scan is a test that creates pictures of the inside of the body. PET stands for positron emission tomography.  For this test, a small dose of a harmless radioactive material is injected into a vein. It will travel through your body in 30-60 minutes.  While lying down and staying very still, you will be moved through a machine that takes pictures of your body. This will take 30-60 minutes.  The colors and brightness on the pictures show different levels of organ and tissue  function. For example, cancer tissue appears brighter than normal tissue on a PET scan image. This information is not intended to replace advice given to you by your health care provider. Make sure you discuss any questions you have with your health care provider. Document Released: 03/13/2003 Document Revised: 09/28/2017 Document Reviewed: 09/28/2017 Elsevier Interactive Patient Education  2019 [place]  [phone] for more information or for a free program for smoking cessation help.   You can call QUIT SMART 1-800-QUIT-NOW for free nicotine patches or replacement therapy- if they are out- keep calling  [facility] Can call for smoking cessation classes, [phone]  If you have a smart phone, please look up Smoke Free app, this will help you stay on track and give you information about money you have saved, life that you have gained back and a ton of more information.     ADVANTAGES OF QUITTING SMOKING  Within 20 minutes, blood pressure decreases. Your pulse is at normal level.  After 8 hours, carbon monoxide levels in the blood return to normal. Your oxygen level increases.  After 24 hours, the chance of having a heart attack starts to decrease. Your breath, hair, and body stop smelling like smoke.  After 48 hours, damaged nerve endings begin to recover. Your sense of taste and smell improve.  After 72 hours, the body is virtually free of nicotine. Your bronchial tubes relax and breathing becomes easier.  After 2 to 12 weeks, lungs can hold more air. Exercise becomes easier and circulation improves.  After 1 year, the risk of coronary heart disease is cut in half.  After 5 years, the risk of stroke falls to the same as a nonsmoker.  After 10 years, the risk of lung cancer is cut in half and the risk of other cancers decreases significantly.  After 15 years, the risk of coronary heart disease drops, usually  to the level of a nonsmoker.  You will have extra money to spend on things other than cigarettes.

## 2018-12-13 LAB — URINALYSIS, ROUTINE W REFLEX MICROSCOPIC
Bacteria, UA: NONE SEEN /HPF
Bilirubin Urine: NEGATIVE
Hgb urine dipstick: NEGATIVE
Hyaline Cast: NONE SEEN /LPF
Ketones, ur: NEGATIVE
Leukocytes,Ua: NEGATIVE
Nitrite: NEGATIVE
RBC / HPF: NONE SEEN /HPF (ref 0–2)
Specific Gravity, Urine: 1.02 (ref 1.001–1.03)
Squamous Epithelial / HPF: NONE SEEN /HPF (ref <=5)
WBC UA: NONE SEEN /HPF (ref 0–5)
pH: <=5 (ref 5.0–8.0)

## 2018-12-13 LAB — URINE CULTURE
MICRO NUMBER:: 347076
Result:: NO GROWTH
SPECIMEN QUALITY:: ADEQUATE

## 2018-12-14 ENCOUNTER — Institutional Professional Consult (permissible substitution) (INDEPENDENT_AMBULATORY_CARE_PROVIDER_SITE_OTHER): Payer: Medicare Other | Admitting: Cardiothoracic Surgery

## 2018-12-14 ENCOUNTER — Telehealth: Payer: Self-pay | Admitting: Cardiothoracic Surgery

## 2018-12-14 ENCOUNTER — Other Ambulatory Visit: Payer: Self-pay | Admitting: *Deleted

## 2018-12-14 DIAGNOSIS — R918 Other nonspecific abnormal finding of lung field: Secondary | ICD-10-CM | POA: Diagnosis not present

## 2018-12-14 DIAGNOSIS — R911 Solitary pulmonary nodule: Secondary | ICD-10-CM

## 2018-12-14 DIAGNOSIS — I779 Disorder of arteries and arterioles, unspecified: Secondary | ICD-10-CM | POA: Diagnosis not present

## 2018-12-14 NOTE — Progress Notes (Signed)
[address]       [place] [address]             [place] VIRTUAL OFFICE NOTE  Referring Provider is Karen Mutters, PA-C Primary Cardiologist is No primary care provider on file. PCP is Karen Pinto, MD   HPI:  I spoke with [name] via telephone on 12/14/2018 at 6:23 PM and verified that I was speaking with the correct person.  I interviewed the patient today for her past history over the phone.  She is referred by primary care because of abnormal CT scan.  The patient has had a history of 3 previous strokes.  She has been told that she has COPD and/or emphysema.  She continues to smoke episodically.  She denies any fever chills or other respiratory symptoms other than shortness of breath with exertion, she does not require home oxygen.    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   Anxiety    Colon polyps    Diabetes mellitus 1998   Diverticulosis    ESOPHAGEAL STRICTURE 08/27/2008   GERD (gastroesophageal reflux disease)    Hemorrhoids    Hiatal hernia    Hyperlipidemia    Hypertension 2000   Migraines    Peripheral vascular disease (HCC)    Phlebitis    30 years ago  left leg   Stroke [facility])    multiple mini strokes ( brain aneurysm )   Past Surgical History:  Procedure Laterality Date   ABDOMINAL AORTAGRAM N/A 01/04/2012   Procedure: ABDOMINAL Maxcine Ham;  Surgeon: Serafina Mitchell, MD;  Location: [facility] CATH LAB;  Service: Cardiovascular;  Laterality: N/A;   ABDOMINAL AORTAGRAM N/A 08/15/2012   Procedure: ABDOMINAL Maxcine Ham;  Surgeon: Serafina Mitchell, MD;  Location: [facility] CATH LAB;  Service: Cardiovascular;  Laterality: N/A;   ABDOMINAL HYSTERECTOMY  1984   cataract surgery Right 10-22-2015   cataract surgery Left 11-05-2015   CESAREAN SECTION      CHOLECYSTECTOMY     COLONOSCOPY  07/2010   DENTAL SURGERY  Aug. 16, 2013   left lower    ESOPHAGOGASTRODUODENOSCOPY  07/2010   EYE SURGERY  Nov. 2014   Laser-Glaucoma   FEMORAL ARTERY STENT  05/11/11   Left superficial femoral and popliteal artery   HERNIA REPAIR     times two   KNEE SURGERY     LOWER EXTREMITY ANGIOGRAM Left 08/15/2012   Procedure: LOWER EXTREMITY ANGIOGRAM;  Surgeon: Serafina Mitchell, MD;  Location: [facility] CATH LAB;  Service: Cardiovascular;  Laterality: Left;  lt leg angio   rotator cuff surgery     THYROID SURGERY      Current Outpatient Medications  Medication Sig Dispense Refill   amLODipine (NORVASC) 10 MG tablet TAKE 1 TABLET BY MOUTH EVERY DAY 90 tablet 1   aspirin EC 81 MG tablet Take 81 mg by mouth at bedtime.      bisoprolol-hydrochlorothiazide (ZIAC) 5-6.25 MG tablet Take 1 tablet Daily for BP 90 tablet 1   Blood Glucose Monitoring Suppl (ACCU-CHEK AVIVA PLUS) W/DEVICE KIT Check blood sugar up to three times daily 1 kit 0   Cholecalciferol (VITAMIN D PO) Take 5,000 Units by mouth daily.     CINNAMON PO Take 1,000 mg by mouth 2 (two)  times daily.      citalopram (CELEXA) 40 MG tablet TAKE 1 TABLET DAILY FOR MOOD & ANXIETY 90 tablet 1   clonazePAM (KLONOPIN) 0.5 MG tablet Take 1/2 to 1 tablet at Bedtime ONLY if Needed & please try to limit to 5 days /week to avoid addiction 90 tablet 0   Continuous Blood Gluc Sensor (FREESTYLE LIBRE SENSOR SYSTEM) MISC Check sugars 3 x a day DX E10.9 1 each 0   glucose blood (ACCU-CHEK AVIVA PLUS) test strip Use daily to check BS TID Dx. E11.22 300 each 1   insulin NPH-regular Human (NOVOLIN 70/30) (70-30) 100 UNIT/ML injection INJECT 25 UNITS IN THE AM [place] WITH FOOD, AND INJECT 25 UNITS [place] WITH EVENING MEAL. 50 mL 2   Insulin Syringe-Needle U-100 31G X 15/64" 0.5 ML MISC Use two pens daily with insulin 100 each 3   Magnesium 500 MG TABS Take 500 mg by mouth daily.     olmesartan (BENICAR) 40 MG  tablet TAKE 1 TABLET DAILY FOR BLOOD PRESSURE 90 tablet 1   Omega-3 Fatty Acids (FISH OIL) 1200 MG CAPS Take 1,200 capsules by mouth daily.      omeprazole (PRILOSEC) 40 MG capsule TAKE 1 CAPSULE BY MOUTH EVERY DAY 90 capsule 2   OVER THE COUNTER MEDICATION Take 1 tablet by mouth daily. One a day vitamin Woman's one a day     rosuvastatin (CRESTOR) 40 MG tablet Take 40 mg by mouth daily.     Semaglutide,0.25 or 0.5MG/DOS, (OZEMPIC, 0.25 OR 0.5 MG/DOSE,) 2 MG/1.5ML SOPN Inject 0.5 mg into the skin once a week. 2 pen 5   vitamin E 400 UNIT capsule Take 400 Units by mouth daily.     No current facility-administered medications for this visit.      Diagnostic Tests: Ct Chest Wo Contrast  Addendum Date: 12/14/2018   ADDENDUM REPORT: 12/14/2018 16:52 ADDENDUM: Cardiothoracic surgeon Dr. Servando Snare has requested a second opinion on this case. ADDENDED FINDINGS: Cardiovascular: Normal heart size. No significant pericardial effusion/thickening. Three-vessel coronary atherosclerosis. Atherosclerotic nonaneurysmal thoracic aorta. Top-normal caliber main pulmonary artery (3 1 cm diameter) stable. Mediastinum/Nodes: No discrete thyroid nodules. Unremarkable esophagus. No axillary adenopathy. Stable coarsely calcified nonenlarged right paratracheal and right hilar nodes from prior granulomatous disease. No pathologically enlarged mediastinal or discrete hilar nodes on this noncontrast scan. Lungs/Pleura: No pneumothorax. No pleural effusion. Moderate centrilobular emphysema with diffuse bronchial wall thickening. Medial apical right upper lobe 2.0 x 1.3 cm ground-glass pulmonary nodule (series 3/image 29), previously 1.8 x 1.3 cm on 10/10/2017 chest CT and 1.7 x 1.2 cm on 10/25/2014 chest CT using similar measurement technique, minimally increased in size. Ground-glass 0.5 cm anterior right upper lobe pulmonary nodule (series 3/image 64) is stable since 10/25/2014 chest CT. Stable coarsely calcified 9 mm  peripheral right lower lobe granuloma. Ground-glass 5 mm left lower lobe pulmonary nodule (series 3/image 68) and subpleural peripheral basilar 4 mm left lower lobe solid pulmonary nodule (series 3/image 104) are stable since 10/25/2014 chest CT. No acute consolidative airspace disease, lung masses or new significant pulmonary nodules. Stable moderate varicoid bronchiectasis in the posterior right middle lobe. Upper abdomen: Stable granulomatous splenic calcifications. Stable appearance of the mildly thickened adrenal glands without discrete adrenal nodules. Musculoskeletal: No aggressive appearing focal osseous lesions. Mild thoracic spondylosis. ADDENDED IMPRESSION: 1. Dominant ground-glass pulmonary nodule in the medial apical right upper lobe measures 2.0 x 1.3 cm and has minimally increased in size on consecutive chest CT studies back to 10/25/2014. Continued chest  CT surveillance warranted at a minimum. Please note that this addendum corrects a laterality error in the original report. 2. Additional subcentimeter ground-glass and solid pulmonary nodules are unchanged back to 2016 chest CT. 3. No thoracic adenopathy. 4. Three-vessel coronary atherosclerosis. Aortic Atherosclerosis (ICD10-I70.0) and Emphysema (ICD10-J43.9). These addended results will be called to the ordering clinician or representative by the Radiologist Assistant, and communication documented in the PACS or zVision Dashboard. Electronically Signed   By: Ilona Sorrel M.D.   On: 12/14/2018 16:52   Result Date: 12/14/2018 CLINICAL DATA:  Chronic shortness of breath. Assess for COPD. Smoker. Pulmonary nodule. EXAM: CT CHEST WITHOUT CONTRAST TECHNIQUE: Multidetector CT imaging of the chest was performed following the standard protocol without IV contrast. COMPARISON:  10/10/2017.  05/16/2015. 10/25/2014. FINDINGS: Cardiovascular: Heart size upper limits of normal. Coronary artery calcification as noted previously. Aortic atherosclerosis without  evidence of aneurysm. Mediastinum/Nodes: Small calcified right hilar and paratracheal lymph nodes related to old granulomatous disease. No evidence of enlarging lymph node. Lungs/Pleura: No pleural fluid on either side. There is central lobular emphysema which is progressive over time. There is some focal bronchiectasis present particularly notable in the right middle lobe. 8 mm densely calcified granuloma in the peripheral right lower lobe. Stable 5 mm nodule in the left lower lobe image 68, similar to the previous studies, clearly benign and not in need of further follow-up. Other tiny subpleural nodular shadows scattered within both lungs appear stable as well and are quite likely benign. There is a region of ground-glass opacity in the medial left upper lobe which is increasing slightly in size and conspicuity over time. Presently this measures up to 1.8 cm in diameter. This is viewed with concern. See recommendations below. Upper Abdomen: Negative Musculoskeletal: Mild spinal degenerative changes. IMPRESSION: Region of ground-glass opacity in the medial left upper lobe, slowly enlarging and becoming more conspicuous. Adenocarcinoma cannot be excluded. Referral to the multi disciplinary chest clinic would be suggested. Other small pulmonary nodules are stable and likely benign. Old calcified granuloma in the right lower lobe with calcified nodes. Centrilobular emphysema, slightly progressive over time. Aortic atherosclerosis and coronary artery calcification. Electronically Signed: By: Nelson Chimes M.D. On: 11/27/2018 20:57    CLINICAL DATA:  Chronic shortness of breath. Assess for COPD. Smoker. Pulmonary nodule.  EXAM: CT CHEST WITHOUT CONTRAST  TECHNIQUE: Multidetector CT imaging of the chest was performed following the standard protocol without IV contrast.  COMPARISON:  10/10/2017.  05/16/2015. 10/25/2014.  FINDINGS: Cardiovascular: Heart size upper limits of normal. Coronary  artery calcification as noted previously. Aortic atherosclerosis without evidence of aneurysm.  Mediastinum/Nodes: Small calcified right hilar and paratracheal lymph nodes related to old granulomatous disease. No evidence of enlarging lymph node.  Lungs/Pleura: No pleural fluid on either side. There is central lobular emphysema which is progressive over time. There is some focal bronchiectasis present particularly notable in the right middle lobe.  8 mm densely calcified granuloma in the peripheral right lower lobe. Stable 5 mm nodule in the left lower lobe image 68, similar to the previous studies, clearly benign and not in need of further follow-up. Other tiny subpleural nodular shadows scattered within both lungs appear stable as well and are quite likely benign.  There is a region of ground-glass opacity in the medial left upper lobe which is increasing slightly in size and conspicuity over time. Presently this measures up to 1.8 cm in diameter. This is viewed with concern. See recommendations below.  Upper Abdomen: Negative  Musculoskeletal:  Mild spinal degenerative changes.  IMPRESSION: Region of ground-glass opacity in the medial left upper lobe, slowly enlarging and becoming more conspicuous. Adenocarcinoma cannot be excluded. Referral to the multi disciplinary chest clinic would be suggested.  Other small pulmonary nodules are stable and likely benign. Old calcified granuloma in the right lower lobe with calcified nodes. Centrilobular emphysema, slightly progressive over time.  Aortic atherosclerosis and coronary artery calcification.   Electronically Signed   By: Nelson Chimes M.D.   On: 11/27/2018 20:57 *[place], [place] [address]               [phone]  ------------------------------------------------------------------- Transthoracic Echocardiography  Patient:  [name], [name] MR #:    [id number] Study Date: 10/05/2014 Gender:   F Age:    32 Height:   167.6 cm Weight:   65.3 kg BSA:    1.75 m^2 Pt. Status: Room:    [address]   [place] [id number]  [place] [id number] [place] Else [id number] [place], [place] M SONOGRAPHER Iverson Alamin, RCS PERFORMING  [facility], Inpatient  cc:  ------------------------------------------------------------------- LV EF: 60% -  65%  ------------------------------------------------------------------- Indications:   434.91 CVA.  ------------------------------------------------------------------- Study Conclusions  - Left ventricle: The cavity size was normal. There was mild concentric hypertrophy. Systolic function was normal. The estimated ejection fraction was in the range of 60% to 65%. Wall motion was normal; there were no regional wall motion abnormalities. Doppler parameters are consistent with abnormal left ventricular relaxation (grade 1 diastolic dysfunction).  Impressions:  - No cardiac source of emboli was indentified. Compared to the prior study, there has been no significant interval change.  Transthoracic echocardiography. M-mode, complete 2D, spectral Doppler, and color Doppler. Birthdate: Patient birthdate: Sep 26, 1945. Age: Patient is 73 yr old. Sex: Gender: female. BMI: 23.2 kg/m^2. Blood pressure:   140/56 Patient status: Inpatient. Study date: Study date: 10/05/2014. Study time: 01:45 PM.  -------------------------------------------------------------------  ------------------------------------------------------------------- Left ventricle: The cavity size was normal. There was mild concentric hypertrophy. Systolic function was normal. The  estimated ejection fraction was in the range of 60% to 65%. Wall motion was normal; there were no regional wall motion abnormalities. Doppler parameters are consistent with abnormal left ventricular relaxation (grade 1 diastolic dysfunction).  ------------------------------------------------------------------- Aortic valve: Poorly visualized. Trileaflet; mildly thickened, mildly calcified leaflets. Mobility was not restricted. Doppler: Transvalvular velocity was within the normal range. There was no stenosis. There was no regurgitation.  Valve area (Vmax): 2.81 cm^2. Indexed valve area (Vmax): 1.61 cm^2/m^2.  Peak gradient (S): 9 mm Hg.  ------------------------------------------------------------------- Aorta: Aortic root: The aortic root was normal in size.  ------------------------------------------------------------------- Mitral valve:  Structurally normal valve.  Mobility was not restricted. Doppler: Transvalvular velocity was within the normal range. There was no evidence for stenosis. There was trivial regurgitation.  ------------------------------------------------------------------- Left atrium: The atrium was normal in size.  ------------------------------------------------------------------- Right ventricle: The cavity size was normal. Wall thickness was normal. Systolic function was normal.  ------------------------------------------------------------------- Pulmonic valve:  Structurally normal valve.  Cusp separation was normal. Doppler: Transvalvular velocity was within the normal range. There was no evidence for stenosis. There was trivial regurgitation.  ------------------------------------------------------------------- Tricuspid valve:  Structurally normal valve.  Doppler: Transvalvular velocity was within the normal range. There was mild regurgitation.  ------------------------------------------------------------------- Pulmonary  artery:  The main pulmonary artery was normal-sized. Systolic pressure was within the normal range.  ------------------------------------------------------------------- Right atrium: The atrium was normal in size.  ------------------------------------------------------------------- Pericardium: There was no pericardial effusion.  ------------------------------------------------------------------- Systemic veins: Inferior vena cava: The vessel was normal in size.  ------------------------------------------------------------------- Measurements  Left ventricle              Value     Reference LV ID, ED, PLAX chordal          43  mm    43 - 52 LV ID, ES, PLAX chordal          27.7 mm    23 - 38 LV fx shortening, PLAX chordal      36  %    >=29 LV PW thickness, ED            12.4 mm    --------- IVS/LV PW ratio, ED            1.04      <=1.3 LV e&', lateral              7.9  cm/s   --------- LV E/e&', lateral             8.85      --------- LV e&', medial               6.6  cm/s   --------- LV E/e&', medial              10.59     --------- LV e&', average              7.25 cm/s   --------- LV E/e&', average             9.64      ---------  Ventricular septum            Value     Reference IVS thickness, ED             12.9 mm    ---------  LVOT                   Value     Reference LVOT ID, S                21  mm    --------- LVOT area                 3.46 cm^2   --------- LVOT peak velocity, S           121  cm/s   --------- LVOT peak gradient, S           6   mm Hg  ---------  Aortic valve               Value      Reference Aortic peak gradient, S          9   mm Hg  --------- Aortic valve area, peak velocity     2.81 cm^2   --------- Aortic valve area/bsa, peak        1.61 cm^2/m^2 --------- velocity  Aorta                   Value     Reference Aortic root ID, ED            33  mm    ---------  Left atrium  Value     Reference LA ID, A-P, ES              39  mm    --------- LA ID/bsa, A-P          (H)   2.23 cm/m^2  <=2.2 LA volume, S               43  ml    --------- LA volume/bsa, S             24.6 ml/m^2  --------- LA volume, ES, 1-p A4C          45  ml    --------- LA volume/bsa, ES, 1-p A4C        25.7 ml/m^2  --------- LA volume, ES, 1-p A2C          41  ml    --------- LA volume/bsa, ES, 1-p A2C        23.4 ml/m^2  ---------  Mitral valve               Value     Reference Mitral E-wave peak velocity        69.9 cm/s   --------- Mitral A-wave peak velocity        106  cm/s   --------- Mitral deceleration time     (H)   271  ms    150 - 230 Mitral E/A ratio, peak          0.7      --------- Mitral maximal regurg velocity,      270  cm/s   --------- PISA  Tricuspid valve              Value     Reference Tricuspid maximal inflow         231  cm/s   --------- velocity, PISA  Right ventricle              Value     Reference RV s&', lateral, S             14.9 cm/s   ---------  Legend: (L) and (H) mark values outside specified reference range.  ------------------------------------------------------------------- Prepared and Electronically Authenticated by  Candee Furbish,  M.D. 2016-01-16T14:51:36  Impression:/Plan #1 multiple pulmonary nodules on CT scan documented since at least 2016, 1 right lower lobe lesion significantly calcified likely benign, a left lower lesion 5 to 8 mm in size appear stable.  The patient does have a right upper lobe groundglass opacity which is been obvious since 2016 but has become slightly larger. 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  We will obtain a copy of the previously done CT scan on disc for possible navigation bronchoscopy, we will obtain pulmonary function studies to see the patient's baseline lung disease.  We will cancel the PET scan as it will be not helpful in the evaluation of a groundglass opacity. Previous ct reports have laterality switched- radiology has amended -   As soon as pulmonary function studies are being done again related to the Covit -19   Patient does have previous history of strokes in the past x3        I discussed limitations of evaluation and management via telephone.  T I spent  in excess of 45  minutes of non-face-to-face time during the conduct of this telephone virtual office consultation. Including coordination of care and reviewing with radiology to correct report s  Level 1  (99441)             5-10 minutes Level 2  (99442)            11-20 minutes Level 3  (99443)            21-30 minutes Grace Isaac MD      [address] [place] [address] Office [phone]   Beeper [phone]   12/14/2018 6:23 PM

## 2018-12-14 NOTE — Telephone Encounter (Signed)
See telephone note.

## 2018-12-22 ENCOUNTER — Other Ambulatory Visit: Payer: Self-pay | Admitting: Internal Medicine

## 2019-01-01 ENCOUNTER — Ambulatory Visit: Payer: Medicare Other | Admitting: Podiatry

## 2019-01-08 ENCOUNTER — Other Ambulatory Visit: Payer: Self-pay

## 2019-01-08 ENCOUNTER — Ambulatory Visit (HOSPITAL_COMMUNITY)
Admission: RE | Admit: 2019-01-08 | Discharge: 2019-01-08 | Disposition: A | Discharge status: Home / Self Care | Payer: Medicare Other | Source: Ambulatory Visit | Attending: Cardiothoracic Surgery | Admitting: Cardiothoracic Surgery

## 2019-01-08 DIAGNOSIS — R911 Solitary pulmonary nodule: Secondary | ICD-10-CM | POA: Diagnosis not present

## 2019-01-08 LAB — PULMONARY FUNCTION TEST
DL/VA % pred: 105 %
DL/VA: 4.31 ml/min/mmHg/L
DLCO unc % pred: 74 %
DLCO unc: 15.3 ml/min/mmHg
FEF 25-75 Post: 0.71 L/sec
FEF 25-75 Pre: 0.61 L/sec
FEF2575-%Change-Post: 15 %
FEF2575-%Pred-Post: 36 %
FEF2575-%Pred-Pre: 31 %
FEV1-%Change-Post: 0 %
FEV1-%Pred-Post: 50 %
FEV1-%Pred-Pre: 50 %
FEV1-Post: 1.21 L
FEV1-Pre: 1.21 L
FEV1FVC-%Change-Post: -4 %
FEV1FVC-%Pred-Pre: 83 %
FEV6-%Change-Post: 6 %
FEV6-%Pred-Post: 65 %
FEV6-%Pred-Pre: 61 %
FEV6-Post: 1.99 L
FEV6-Pre: 1.87 L
FEV6FVC-%Change-Post: 0 %
FEV6FVC-%Pred-Post: 102 %
FEV6FVC-%Pred-Pre: 101 %
FVC-%Change-Post: 5 %
FVC-%Pred-Post: 64 %
FVC-%Pred-Pre: 60 %
FVC-Post: 2.03 L
FVC-Pre: 1.93 L
Post FEV1/FVC ratio: 60 %
Post FEV6/FVC ratio: 98 %
Pre FEV1/FVC ratio: 63 %
Pre FEV6/FVC Ratio: 97 %
RV % pred: 121 %
RV: 2.83 L
TLC % pred: 90 %
TLC: 4.82 L

## 2019-01-08 MED ORDER — ALBUTEROL SULFATE (2.5 MG/3ML) 0.083% IN NEBU
2.5000 mg | INHALATION_SOLUTION | Freq: Once | RESPIRATORY_TRACT | Status: AC
  Administered 2019-01-08: 2.5 mg via RESPIRATORY_TRACT

## 2019-01-18 ENCOUNTER — Other Ambulatory Visit: Payer: Self-pay

## 2019-01-18 ENCOUNTER — Telehealth (INDEPENDENT_AMBULATORY_CARE_PROVIDER_SITE_OTHER): Payer: Medicare Other | Admitting: Cardiothoracic Surgery

## 2019-01-18 DIAGNOSIS — R911 Solitary pulmonary nodule: Secondary | ICD-10-CM

## 2019-01-18 NOTE — Progress Notes (Signed)
[address]       [place] [address]             [phone]     Telehealth Visit     Virtual Visit via Video Note   This visit type was conducted due to national recommendations for restrictions regarding the COVID-19 Pandemic (e.g. social distancing) in an effort to limit this patient's exposure and mitigate transmission in our community.  Due to her co-morbid illnesses, this patient is at least at moderate risk for complications without adequate follow up.  This format is felt to be most appropriate for this patient at this time.  All issues noted in this document were discussed and addressed.  A limited physical exam was performed with this format.     Referring Provider is [name], PA-C Primary Cardiologist is No primary care provider on file. PCP is [name], MD   HPI:  I spoke with Lacy Duverney via video call  on 01/18/2019 at 12:53 PM and verified that I was speaking with the correct person.  I interviewed the patient today for her past history over the phone.  She is referred by primary care because of abnormal CT scan of the chest.  On her previous visit it was apparent that the CT scan readings at the sides reversed repeat radiology review of this scan was done an addendum submitted below . since last seen hello the patient has had a history of 3 previous strokes most recent approximately a year ago still with residual numbness in her right hand and lip.  She has been told that she has COPD and/or emphysema.  She continues to smoke episodically.  She notes that she become short of breath with exertion such as walking in the grocery store or walking to her mailbox.  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   Anxiety    Colon polyps    Diabetes mellitus 1998   Diverticulosis    ESOPHAGEAL STRICTURE 08/27/2008   GERD (gastroesophageal reflux disease)    Hemorrhoids    Hiatal hernia    Hyperlipidemia    Hypertension 2000   Migraines    Peripheral vascular disease ([name])    Phlebitis    30 years ago  left leg   Stroke [facility])    multiple mini strokes ( brain aneurysm )   Past Surgical History:  Procedure Laterality Date   ABDOMINAL AORTAGRAM N/A 01/04/2012   Procedure: ABDOMINAL Maxcine Ham;  Surgeon: Serafina Mitchell, MD;  Location: [facility] CATH LAB;  Service: Cardiovascular;  Laterality: N/A;   ABDOMINAL AORTAGRAM N/A 08/15/2012   Procedure: ABDOMINAL Maxcine Ham;  Surgeon: Serafina Mitchell, MD;  Location: [facility] CATH LAB;  Service: Cardiovascular;  Laterality: N/A;   ABDOMINAL HYSTERECTOMY  1984   cataract surgery Right 10-22-2015   cataract surgery Left 11-05-2015   CESAREAN SECTION     CHOLECYSTECTOMY     COLONOSCOPY  07/2010   DENTAL SURGERY  Aug. 16, 2013   left lower    ESOPHAGOGASTRODUODENOSCOPY  07/2010   EYE SURGERY  Nov. 2014   Laser-Glaucoma   FEMORAL ARTERY STENT  05/11/11   Left superficial  femoral and popliteal artery   HERNIA REPAIR     times two   KNEE SURGERY     LOWER EXTREMITY ANGIOGRAM Left 08/15/2012   Procedure: LOWER EXTREMITY ANGIOGRAM;  Surgeon: Serafina Mitchell, MD;  Location: [facility] CATH LAB;  Service: Cardiovascular;  Laterality: Left;  lt leg angio   rotator cuff surgery     THYROID SURGERY      Current Outpatient Medications  Medication Sig Dispense Refill   amLODipine (NORVASC) 10 MG tablet TAKE 1 TABLET BY MOUTH EVERY DAY 90 tablet 1   aspirin EC 81 MG tablet Take 81 mg by mouth at bedtime.      bisoprolol-hydrochlorothiazide (ZIAC) 5-6.25 MG tablet Take 1 tablet Daily for BP 90 tablet 1   Blood Glucose Monitoring Suppl (ACCU-CHEK AVIVA PLUS) W/DEVICE KIT Check blood sugar up to three  times daily 1 kit 0   Cholecalciferol (VITAMIN D PO) Take 5,000 Units by mouth daily.     CINNAMON PO Take 1,000 mg by mouth 2 (two) times daily.      citalopram (CELEXA) 40 MG tablet TAKE 1 TABLET DAILY FOR MOOD & ANXIETY 90 tablet 1   clonazePAM (KLONOPIN) 0.5 MG tablet Take 1/2 to 1 tablet at Bedtime ONLY if Needed & please try to limit to 5 days /week to avoid addiction 90 tablet 0   Continuous Blood Gluc Sensor (FREESTYLE LIBRE SENSOR SYSTEM) MISC Check sugars 3 x a day DX E10.9 1 each 0   glucose blood (ACCU-CHEK AVIVA PLUS) test strip Use daily to check BS TID Dx. E11.22 300 each 1   insulin NPH-regular Human (NOVOLIN 70/30) (70-30) 100 UNIT/ML injection INJECT 25 UNITS IN THE AM [place] WITH FOOD, AND INJECT 25 UNITS [place] WITH EVENING MEAL. 50 mL 2   Insulin Syringe-Needle U-100 31G X 15/64" 0.5 ML MISC Use two pens daily with insulin 100 each 3   Magnesium 500 MG TABS Take 500 mg by mouth daily.     olmesartan (BENICAR) 40 MG tablet TAKE 1 TABLET DAILY FOR BLOOD PRESSURE 90 tablet 1   Omega-3 Fatty Acids (FISH OIL) 1200 MG CAPS Take 1,200 capsules by mouth daily.      omeprazole (PRILOSEC) 40 MG capsule TAKE 1 CAPSULE BY MOUTH EVERY DAY 90 capsule 2   OVER THE COUNTER MEDICATION Take 1 tablet by mouth daily. One a day vitamin Woman's one a day     rosuvastatin (CRESTOR) 40 MG tablet TAKE 1/2 TO 1 TABLET DAILY OR AS DIRECTED FOR CHOLESTEROL 90 tablet 1   Semaglutide,0.25 or 0.5MG/DOS, (OZEMPIC, 0.25 OR 0.5 MG/DOSE,) 2 MG/1.5ML SOPN Inject 0.5 mg into the skin once a week. 2 pen 5   vitamin E 400 UNIT capsule Take 400 Units by mouth daily.     No current facility-administered medications for this visit.      Diagnostic Tests:  ADDENDUM REPORT: 12/14/2018 16:52  ADDENDUM: Cardiothoracic surgeon Dr. Servando Snare has requested a second opinion on this case.  ADDENDED FINDINGS:  Cardiovascular: Normal heart size. No significant pericardial effusion/thickening. Three-vessel  coronary atherosclerosis. Atherosclerotic nonaneurysmal thoracic aorta. Top-normal caliber main pulmonary artery (3 1 cm diameter) stable.  Mediastinum/Nodes: No discrete thyroid nodules. Unremarkable esophagus. No axillary adenopathy. Stable coarsely calcified nonenlarged right paratracheal and right hilar nodes from prior granulomatous disease. No pathologically enlarged mediastinal or discrete hilar nodes on this noncontrast scan.  Lungs/Pleura: No pneumothorax. No pleural effusion. Moderate centrilobular emphysema with diffuse bronchial wall thickening. Medial apical right upper lobe 2.0 x 1.3 cm  ground-glass pulmonary nodule (series 3/image 29), previously 1.8 x 1.3 cm on 10/10/2017 chest CT and 1.7 x 1.2 cm on 10/25/2014 chest CT using similar measurement technique, minimally increased in size. Ground-glass 0.5 cm anterior right upper lobe pulmonary nodule (series 3/image 64) is stable since 10/25/2014 chest CT. Stable coarsely calcified 9 mm peripheral right lower lobe granuloma. Ground-glass 5 mm left lower lobe pulmonary nodule (series 3/image 68) and subpleural peripheral basilar 4 mm left lower lobe solid pulmonary nodule (series 3/image 104) are stable since 10/25/2014 chest CT. No acute consolidative airspace disease, lung masses or new significant pulmonary nodules. Stable moderate varicoid bronchiectasis in the posterior right middle lobe.  Upper abdomen: Stable granulomatous splenic calcifications. Stable appearance of the mildly thickened adrenal glands without discrete adrenal nodules.  Musculoskeletal: No aggressive appearing focal osseous lesions. Mild thoracic spondylosis.  ADDENDED IMPRESSION:  1. Dominant ground-glass pulmonary nodule in the medial apical right upper lobe measures 2.0 x 1.3 cm and has minimally increased in size on consecutive chest CT studies back to 10/25/2014. Continued chest CT surveillance warranted at a minimum. Please note  that this addendum corrects a laterality error in the original report. 2. Additional subcentimeter ground-glass and solid pulmonary nodules are unchanged back to 2016 chest CT. 3. No thoracic adenopathy. 4. Three-vessel coronary atherosclerosis.  Aortic Atherosclerosis (ICD10-I70.0) and Emphysema (ICD10-J43.9).  These addended results will be called to the ordering clinician or representative by the Radiologist Assistant, and communication documented in the PACS or zVision Dashboard.   Electronically Signed   By: Ilona Sorrel M.D.   On: 12/14/2018 16:52    Ct Chest Wo Contrast  Addendum Date: 12/14/2018   ADDENDUM REPORT: 12/14/2018 16:52 ADDENDUM: Cardiothoracic surgeon Dr. Servando Snare has requested a second opinion on this case. ADDENDED FINDINGS: Cardiovascular: Normal heart size. No significant pericardial effusion/thickening. Three-vessel coronary atherosclerosis. Atherosclerotic nonaneurysmal thoracic aorta. Top-normal caliber main pulmonary artery (3 1 cm diameter) stable. Mediastinum/Nodes: No discrete thyroid nodules. Unremarkable esophagus. No axillary adenopathy. Stable coarsely calcified nonenlarged right paratracheal and right hilar nodes from prior granulomatous disease. No pathologically enlarged mediastinal or discrete hilar nodes on this noncontrast scan. Lungs/Pleura: No pneumothorax. No pleural effusion. Moderate centrilobular emphysema with diffuse bronchial wall thickening. Medial apical right upper lobe 2.0 x 1.3 cm ground-glass pulmonary nodule (series 3/image 29), previously 1.8 x 1.3 cm on 10/10/2017 chest CT and 1.7 x 1.2 cm on 10/25/2014 chest CT using similar measurement technique, minimally increased in size. Ground-glass 0.5 cm anterior right upper lobe pulmonary nodule (series 3/image 64) is stable since 10/25/2014 chest CT. Stable coarsely calcified 9 mm peripheral right lower lobe granuloma. Ground-glass 5 mm left lower lobe pulmonary nodule (series 3/image  68) and subpleural peripheral basilar 4 mm left lower lobe solid pulmonary nodule (series 3/image 104) are stable since 10/25/2014 chest CT. No acute consolidative airspace disease, lung masses or new significant pulmonary nodules. Stable moderate varicoid bronchiectasis in the posterior right middle lobe. Upper abdomen: Stable granulomatous splenic calcifications. Stable appearance of the mildly thickened adrenal glands without discrete adrenal nodules. Musculoskeletal: No aggressive appearing focal osseous lesions. Mild thoracic spondylosis. ADDENDED IMPRESSION: 1. Dominant ground-glass pulmonary nodule in the medial apical right upper lobe measures 2.0 x 1.3 cm and has minimally increased in size on consecutive chest CT studies back to 10/25/2014. Continued chest CT surveillance warranted at a minimum. Please note that this addendum corrects a laterality error in the original report. 2. Additional subcentimeter ground-glass and solid pulmonary nodules are unchanged back to 2016 chest  CT. 3. No thoracic adenopathy. 4. Three-vessel coronary atherosclerosis. Aortic Atherosclerosis (ICD10-I70.0) and Emphysema (ICD10-J43.9). These addended results will be called to the ordering clinician or representative by the Radiologist Assistant, and communication documented in the PACS or zVision Dashboard. Electronically Signed   By: Ilona Sorrel M.D.   On: 12/14/2018 16:52    I have independently reviewed the above radiology studies  and reviewed the findings with the patient.    Result Date: 12/14/2018 CLINICAL DATA:  Chronic shortness of breath. Assess for COPD. Smoker. Pulmonary nodule. EXAM: CT CHEST WITHOUT CONTRAST TECHNIQUE: Multidetector CT imaging of the chest was performed following the standard protocol without IV contrast. COMPARISON:  10/10/2017.  05/16/2015. 10/25/2014. FINDINGS: Cardiovascular: Heart size upper limits of normal. Coronary artery calcification as noted previously. Aortic atherosclerosis  without evidence of aneurysm. Mediastinum/Nodes: Small calcified right hilar and paratracheal lymph nodes related to old granulomatous disease. No evidence of enlarging lymph node. Lungs/Pleura: No pleural fluid on either side. There is central lobular emphysema which is progressive over time. There is some focal bronchiectasis present particularly notable in the right middle lobe. 8 mm densely calcified granuloma in the peripheral right lower lobe. Stable 5 mm nodule in the left lower lobe image 68, similar to the previous studies, clearly benign and not in need of further follow-up. Other tiny subpleural nodular shadows scattered within both lungs appear stable as well and are quite likely benign. There is a region of ground-glass opacity in the medial left upper lobe which is increasing slightly in size and conspicuity over time. Presently this measures up to 1.8 cm in diameter. This is viewed with concern. See recommendations below. Upper Abdomen: Negative Musculoskeletal: Mild spinal degenerative changes. IMPRESSION: Region of ground-glass opacity in the medial left upper lobe, slowly enlarging and becoming more conspicuous. Adenocarcinoma cannot be excluded. Referral to the multi disciplinary chest clinic would be suggested. Other small pulmonary nodules are stable and likely benign. Old calcified granuloma in the right lower lobe with calcified nodes. Centrilobular emphysema, slightly progressive over time. Aortic atherosclerosis and coronary artery calcification. Electronically Signed: By: Nelson Chimes M.D. On: 11/27/2018 20:57    CLINICAL DATA:  Chronic shortness of breath. Assess for COPD. Smoker. Pulmonary nodule.  EXAM: CT CHEST WITHOUT CONTRAST  TECHNIQUE: Multidetector CT imaging of the chest was performed following the standard protocol without IV contrast.  COMPARISON:  10/10/2017.  05/16/2015. 10/25/2014.  FINDINGS: Cardiovascular: Heart size upper limits of normal. Coronary  artery calcification as noted previously. Aortic atherosclerosis without evidence of aneurysm.  Mediastinum/Nodes: Small calcified right hilar and paratracheal lymph nodes related to old granulomatous disease. No evidence of enlarging lymph node.  Lungs/Pleura: No pleural fluid on either side. There is central lobular emphysema which is progressive over time. There is some focal bronchiectasis present particularly notable in the right middle lobe.  8 mm densely calcified granuloma in the peripheral right lower lobe. Stable 5 mm nodule in the left lower lobe image 68, similar to the previous studies, clearly benign and not in need of further follow-up. Other tiny subpleural nodular shadows scattered within both lungs appear stable as well and are quite likely benign.  There is a region of ground-glass opacity in the medial left upper lobe which is increasing slightly in size and conspicuity over time. Presently this measures up to 1.8 cm in diameter. This is viewed with concern. See recommendations below.  Upper Abdomen: Negative  Musculoskeletal: Mild spinal degenerative changes.  IMPRESSION: Region of ground-glass opacity in the medial  left upper lobe, slowly enlarging and becoming more conspicuous. Adenocarcinoma cannot be excluded. Referral to the multi disciplinary chest clinic would be suggested.  Other small pulmonary nodules are stable and likely benign. Old calcified granuloma in the right lower lobe with calcified nodes. Centrilobular emphysema, slightly progressive over time.  Aortic atherosclerosis and coronary artery calcification.   Electronically Signed   By: Nelson Chimes M.D.   On: 11/27/2018 20:57 *[place], [place] [address]               [phone]  ------------------------------------------------------------------- Transthoracic Echocardiography  Patient:  Deveney, Bayon MR #:    [id number] Study Date: 10/05/2014 Gender:   F Age:    73 Height:   167.6 cm Weight:   65.3 kg BSA:    1.75 m^2 Pt. Status: Room:    [address]   [place] [id number]  [place] [id number] Coral Else [id number] [place], [place] M SONOGRAPHER Iverson Alamin, RCS PERFORMING  [facility], Inpatient  cc:  ------------------------------------------------------------------- LV EF: 60% -  65%  ------------------------------------------------------------------- Indications:   434.91 CVA.  ------------------------------------------------------------------- Study Conclusions  - Left ventricle: The cavity size was normal. There was mild concentric hypertrophy. Systolic function was normal. The estimated ejection fraction was in the range of 60% to 65%. Wall motion was normal; there were no regional wall motion abnormalities. Doppler parameters are consistent with abnormal left ventricular relaxation (grade 1 diastolic dysfunction).  Impressions:  - No cardiac source of emboli was indentified. Compared to the prior study, there has been no significant interval change.  Transthoracic echocardiography. M-mode, complete 2D, spectral Doppler, and color Doppler. Birthdate: Patient birthdate: 08/07/1946. Age: Patient is 73 yr old. Sex: Gender: female. BMI: 23.2 kg/m^2. Blood pressure:   140/56 Patient status: Inpatient. Study date: Study date: 10/05/2014. Study time: 01:45 PM.  -------------------------------------------------------------------  ------------------------------------------------------------------- Left ventricle: The cavity size was normal. There was mild concentric hypertrophy. Systolic function was normal. The  estimated ejection fraction was in the range of 60% to 65%. Wall motion was normal; there were no regional wall motion abnormalities. Doppler parameters are consistent with abnormal left ventricular relaxation (grade 1 diastolic dysfunction).  ------------------------------------------------------------------- Aortic valve: Poorly visualized. Trileaflet; mildly thickened, mildly calcified leaflets. Mobility was not restricted. Doppler: Transvalvular velocity was within the normal range. There was no stenosis. There was no regurgitation.  Valve area (Vmax): 2.81 cm^2. Indexed valve area (Vmax): 1.61 cm^2/m^2.  Peak gradient (S): 9 mm Hg.  ------------------------------------------------------------------- Aorta: Aortic root: The aortic root was normal in size.  ------------------------------------------------------------------- Mitral valve:  Structurally normal valve.  Mobility was not restricted. Doppler: Transvalvular velocity was within the normal range. There was no evidence for stenosis. There was trivial regurgitation.  ------------------------------------------------------------------- Left atrium: The atrium was normal in size.  ------------------------------------------------------------------- Right ventricle: The cavity size was normal. Wall thickness was normal. Systolic function was normal.  ------------------------------------------------------------------- Pulmonic valve:  Structurally normal valve.  Cusp separation was normal. Doppler: Transvalvular velocity was within the normal range. There was no evidence for stenosis. There was trivial regurgitation.  ------------------------------------------------------------------- Tricuspid valve:  Structurally normal valve.  Doppler: Transvalvular velocity was within the normal range. There was mild regurgitation.  ------------------------------------------------------------------- Pulmonary  artery:  The main pulmonary artery was normal-sized. Systolic pressure was within  the normal range.  ------------------------------------------------------------------- Right atrium: The atrium was normal in size.  ------------------------------------------------------------------- Pericardium: There was no pericardial effusion.  ------------------------------------------------------------------- Systemic veins: Inferior vena cava: The vessel was normal in size.  ------------------------------------------------------------------- Measurements  Left ventricle              Value     Reference LV ID, ED, PLAX chordal          43  mm    43 - 52 LV ID, ES, PLAX chordal          27.7 mm    23 - 38 LV fx shortening, PLAX chordal      36  %    >=29 LV PW thickness, ED            12.4 mm    --------- IVS/LV PW ratio, ED            1.04      <=1.3 LV e&', lateral              7.9  cm/s   --------- LV E/e&', lateral             8.85      --------- LV e&', medial               6.6  cm/s   --------- LV E/e&', medial              10.59     --------- LV e&', average              7.25 cm/s   --------- LV E/e&', average             9.64      ---------  Ventricular septum            Value     Reference IVS thickness, ED             12.9 mm    ---------  LVOT                   Value     Reference LVOT ID, S                21  mm    --------- LVOT area                 3.46 cm^2   --------- LVOT peak velocity, S           121  cm/s   --------- LVOT peak gradient, S           6   mm Hg  ---------  Aortic valve               Value      Reference Aortic peak gradient, S          9   mm Hg  --------- Aortic valve area, peak velocity     2.81 cm^2   --------- Aortic valve area/bsa, peak        1.61 cm^2/m^2 --------- velocity  Aorta                   Value     Reference Aortic root ID, ED            33  mm    ---------  Left atrium                Value     Reference LA ID, A-P,  ES              39  mm    --------- LA ID/bsa, A-P          (H)   2.23 cm/m^2  <=2.2 LA volume, S               43  ml    --------- LA volume/bsa, S             24.6 ml/m^2  --------- LA volume, ES, 1-p A4C          45  ml    --------- LA volume/bsa, ES, 1-p A4C        25.7 ml/m^2  --------- LA volume, ES, 1-p A2C          41  ml    --------- LA volume/bsa, ES, 1-p A2C        23.4 ml/m^2  ---------  Mitral valve               Value     Reference Mitral E-wave peak velocity        69.9 cm/s   --------- Mitral A-wave peak velocity        106  cm/s   --------- Mitral deceleration time     (H)   271  ms    150 - 230 Mitral E/A ratio, peak          0.7      --------- Mitral maximal regurg velocity,      270  cm/s   --------- PISA  Tricuspid valve              Value     Reference Tricuspid maximal inflow         231  cm/s   --------- velocity, PISA  Right ventricle              Value     Reference RV s&', lateral, S             14.9 cm/s   ---------  Legend: (L) and (H) mark values outside specified reference range.  ------------------------------------------------------------------- Prepared and Electronically Authenticated by  Candee Furbish,  M.D. 2016-01-16T14:51:36  pft'S 11/2018: fev1 1.2 50% DLCO 15.3 74% The FVC, FEV1, FEV1/FVC ratio and FEF25-75% are reduced indicating airway obstruction. The increased airway resistance and decreased specific conductance indicate a central airway disease. The SVC is reduced, but the TLC is within normal limits. Following administration of bronchodilators, there is no significant response. The reduced diffusing capacity indicates a minimal loss of functional alveolar capillary surface. However, the diffusing capacity was not corrected for the patient's hemoglobin. Maldistribution of ventilation is indicated by the difference between the alveolar volume and the total lung capacity. Conclusions: Although there is severe airway obstruction and a diffusion defect suggesting emphysema, the absence of overinflation indicates a concurrent restrictive process which may account for the diffusion defect. A clinical trial of bronchodilators may be beneficial in view of the airway obstruction. Pulmonary Function Diagnosis: Severe Obstructive Airways Disease Restriction -Probable Minimal Diffusion Defect    Impression:/Plan #1 multiple pulmonary nodules on CT scan documented since at least 2016, 1 right lower lobe lesion significantly calcified likely benign, a left lower lesion 5 to 8 mm in size appear stable.  The patient does have a right upper lobe groundglass opacity which is been obvious since 2016 but has become slightly larger.  radiology to review these films as the reports are not consistent.  I discussed  with the patient that the groundglass opacities particularly the right upper lobe may represent carcinoma in situ.  They have been present since 2016 with very slight change.  At this point I recommended to her we obtain a follow-up super D CT scan 5 months about the end of July.   #2  COPD with severe obstructive airway disease by PFTs, minimal diffusion deficit.  The patient did not have  significant improvement on PFT testing with bronchodilators, but a trial of bronchodilators may be beneficial considering airway obstruction.  She will discuss with primary care this  Patient does have previous history of strokes in the past x3   We will plan to see her back at the end of July with a super D CT scan of the chest and follow-up visit.   I discussed limitations of evaluation and management via video I spent in excess of  15   minutes of  Video  time during the conduct of this  virtual office visit. Including coordination of care and reviewing with radiology to correct report s  Grace Isaac MD      [address] [place] [address] Office [phone]   Beeper [phone]   01/18/2019 12:53 PM

## 2019-01-18 NOTE — Progress Notes (Signed)
1 month f/u prefers a Virtual Visit/ multiple lung nodules Review PFT's from 01/08/19

## 2019-01-19 ENCOUNTER — Other Ambulatory Visit: Payer: Self-pay | Admitting: *Deleted

## 2019-01-19 DIAGNOSIS — R911 Solitary pulmonary nodule: Secondary | ICD-10-CM

## 2019-01-25 ENCOUNTER — Other Ambulatory Visit: Payer: Self-pay | Admitting: *Deleted

## 2019-01-25 NOTE — Progress Notes (Signed)
The proposed treatment discussed in cancer conference 01/25/2019 is for discussion purpose only and is not a binding recommendation.  The patient was not physically examined nor present for their treatment options.  Therefore, final treatment plans cannot be decided.

## 2019-01-29 ENCOUNTER — Other Ambulatory Visit: Payer: Self-pay

## 2019-01-29 ENCOUNTER — Ambulatory Visit (INDEPENDENT_AMBULATORY_CARE_PROVIDER_SITE_OTHER): Payer: Medicare Other | Admitting: Internal Medicine

## 2019-01-29 ENCOUNTER — Encounter: Payer: Self-pay | Admitting: Internal Medicine

## 2019-01-29 DIAGNOSIS — Z794 Long term (current) use of insulin: Secondary | ICD-10-CM | POA: Diagnosis not present

## 2019-01-29 DIAGNOSIS — E1151 Type 2 diabetes mellitus with diabetic peripheral angiopathy without gangrene: Secondary | ICD-10-CM

## 2019-01-29 DIAGNOSIS — E782 Mixed hyperlipidemia: Secondary | ICD-10-CM

## 2019-01-29 DIAGNOSIS — I779 Disorder of arteries and arterioles, unspecified: Secondary | ICD-10-CM

## 2019-01-29 NOTE — Patient Instructions (Signed)
Please continue: - Novolog 70/30 25 units twice a day 15 minutes before breakfast and dinner - Ozempic 0.5 mg weekly  Please return in 3 months with your sugar log.

## 2019-01-29 NOTE — Progress Notes (Signed)
Patient ID: [name], female   DOB: [birth year], 73 y.o.   MRN: [id number]   Patient location: Home My location: Office  Referring Provider: Dr. Percival Spanish   I connected with the patient on 01/29/19 at 9:45 AM EDT by a video enabled telemedicine application and verified that I am speaking with the correct person.   I discussed the limitations of evaluation and management by telemedicine and the availability of in person appointments. The patient expressed understanding and agreed to proceed.   Details of the encounter are shown below.  HPI: [name] is a 73 y.o.-year-old female, initially referred by her cardiologist, Dr.Hochrein, presenting for follow-up for DM2, dx in ~2000, insulin-dependent since ~2005, uncontrolled, with complications (PVD - s/p stents and fem-pop bypass; h/o CVA x3; CKD stage 3b; DR; PN). She saw my colleague, Dr. Loanne Drilling many years ago.  Last visit with me 2 months ago. PCP: [name], PA  Reviewed her HbA1c levels: Lab Results  Component Value Date   HGBA1C 10.9 (H) 11/13/2018   HGBA1C 9.7 (H) 07/19/2018   HGBA1C 8.7 (H) 04/18/2018   HGBA1C 8.3 (H) 01/03/2018   HGBA1C 10.0 (H) 09/27/2017   HGBA1C 10.5 (H) 05/04/2017   HGBA1C 8.9 02/25/2017   HGBA1C 10.9 (H) 11/10/2016   HGBA1C 9.3 (H) 07/22/2016   HGBA1C 9.5 (H) 02/02/2016   Component     Latest Ref Rng & Units 07/22/2016  Glutamic Acid Decarb Ab     <5 IU/mL <5   She is - Novolog 70/30 25 units 2x a day -injecting in the hips and arms as her stomach hurts. - Ozempic 0.5 mg weekly-started 11/2018 at 0.25 mg weekly, then increased 4 weeks ago - Cinnamon 1000 mg 2x a day She was on Invokana >> intolerance 2/2 weight loss and dehydration >> hospitalization. She was on Metformin and sulfonylurea at diagnosis. She was also on Januvia at the same time with Invokana >> stopped along with Invokana   Pt checks her sugars 0 to once a day: - am: 168-296 >> 149-172 - 2h after b'fast: 326 >> ? -  before lunch: 265 >> 124-172 - 2h after lunch: 280, 286 >> ?  - before dinner: 176, 244 >> 119-156 - 2h after dinner: 231 >> 149-183, 203 - bedtime: n/c - nighttime: n/c Lowest sugar was 150 >> 119; she has hypoglycemia awareness at 140 Highest sugar was 300s >> 203.  Glucometer: [place]  Pt's meals are: - Breakfast: eggs or cheese sandwich - Lunch: half a sandwich, apple - Dinner 9-9:30 pm: chicken, veggies - Snacks: stopped cheese; now apples and pickles She works part-time, from 10 am - 2 pm Tue-Fri.  -+ CKD stage IIIb, last BUN/creatinine:  Lab Results  Component Value Date   BUN 16 11/13/2018   BUN 17 08/28/2018   CREATININE 1.59 (H) 11/13/2018   CREATININE 1.44 (H) 08/28/2018  On olmesartan 40.  -+ HL; last set of lipids: Lab Results  Component Value Date   CHOL 134 11/13/2018   HDL 40 (L) 11/13/2018   LDLCALC 73 11/13/2018   LDLDIRECT 146.4 01/30/2010   TRIG 131 11/13/2018   CHOLHDL 3.4 11/13/2018  On Crestor 40, omega-3 fatty acids 1200 mg daily.  - last eye exam was in 10/2017: + DR, + glaucoma.  - + numbness and tingling in her feet.  She has a callus on the left foot.  She has a podiatrist.  She has intolerance to Lyrica.  On ASA 81.  Pt has [place]  of DM in sister.  She also has a history of nontoxic multinodular goiter.  Latest TSH is normal: Lab Results  Component Value Date   TSH 1.65 11/13/2018   Also, she has a history of HTN, brain aneurysm, emphysema, esophageal stricture, GERD.  ROS: Constitutional: no weight gain/+ weight loss (7 lbs), no fatigue, no subjective hyperthermia, no subjective hypothermia Eyes: + blurry vision, no xerophthalmia ENT: no sore throat, no nodules palpated in neck, no dysphagia, no odynophagia, no hoarseness Cardiovascular: no CP/no SOB/no palpitations/no leg swelling Respiratory: no cough/no SOB/no wheezing Gastrointestinal: no N/no V/no D/no C/no acid reflux Musculoskeletal: no muscle aches/no joint aches Skin: no  rashes, no hair loss Neurological: no tremors/no numbness/no tingling/+ dizziness - when her sugars are decreasing beyond 140  I reviewed pt's medications, allergies, PMH, social hx, family hx, and changes were documented in the history of present illness. Otherwise, unchanged from my initial visit note.  Past Medical History:  Diagnosis Date  . Abnormal findings on esophagogastroduodenoscopy (EGD) 07/2010  . Aneurysm [facility]) 2013   Right Brain   . Aneurysm ([place])    in brain x 2  . Anxiety   . Colon polyps   . Diabetes mellitus 1998  . Diverticulosis   . ESOPHAGEAL STRICTURE 08/27/2008  . GERD (gastroesophageal reflux disease)   . Hemorrhoids   . Hiatal hernia   . Hyperlipidemia   . Hypertension 2000  . Migraines   . Peripheral vascular disease ([place])   . Phlebitis    30 years ago  left leg  . Stroke [facility])    multiple mini strokes ( brain aneurysm )   Past Surgical History:  Procedure Laterality Date  . ABDOMINAL AORTAGRAM N/A 01/04/2012   Procedure: ABDOMINAL Maxcine Ham;  Surgeon: Serafina Mitchell, MD;  Location: [facility] CATH LAB;  Service: Cardiovascular;  Laterality: N/A;  . ABDOMINAL AORTAGRAM N/A 08/15/2012   Procedure: ABDOMINAL Maxcine Ham;  Surgeon: Serafina Mitchell, MD;  Location: [facility] CATH LAB;  Service: Cardiovascular;  Laterality: N/A;  . ABDOMINAL HYSTERECTOMY  1984  . cataract surgery Right 10-22-2015  . cataract surgery Left 11-05-2015  . CESAREAN SECTION    . CHOLECYSTECTOMY    . COLONOSCOPY  07/2010  . DENTAL SURGERY  Aug. 16, 2013   left lower   . ESOPHAGOGASTRODUODENOSCOPY  07/2010  . EYE SURGERY  Nov. 2014   Laser-Glaucoma  . FEMORAL ARTERY STENT  05/11/11   Left superficial femoral and popliteal artery  . HERNIA REPAIR     times two  . KNEE SURGERY    . LOWER EXTREMITY ANGIOGRAM Left 08/15/2012   Procedure: LOWER EXTREMITY ANGIOGRAM;  Surgeon: Serafina Mitchell, MD;  Location: [facility] CATH LAB;  Service: Cardiovascular;  Laterality: Left;  lt leg angio  . rotator  cuff surgery    . THYROID SURGERY     Social History   Socioeconomic History  . Marital status: Married    Spouse name: Not on file  . Number of children: 2  . Years of education: Not on file  . Highest education level: Not on file  Occupational History  . 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 Financial resource strain: Not on file  . Food insecurity:    Worry: Not on file    Inability: Not on file  . Transportation needs:    Medical: Not on file    Non-medical: Not on file  Tobacco Use  . Smoking status: Current Every Day Smoker  Packs/day: 0.25    Years: 40.00    Pack years: 10.00    Types: Cigarettes  . Smokeless tobacco: Never Used  Substance and Sexual Activity  . Alcohol use: No    Alcohol/week: 0.0 standard drinks  . Drug use: No  . Sexual activity: Not on file  Lifestyle  . Physical activity:    Days per week: Not on file    Minutes per session: Not on file  . Stress: Not on file  Relationships  . Social connections:    Talks on phone: Not on file    Gets together: Not on file    Attends religious service: Not on file    Active member of club or organization: Not on file    Attends meetings of clubs or organizations: Not on file    Relationship status: Not on file  . Intimate partner violence:    Fear of current or ex partner: Not on file    Emotionally abused: Not on file    Physically abused: Not on file    Forced sexual activity: Not on file  Other Topics Concern  . Not on file  Social History Narrative   Lives with husband.        Current Outpatient Medications on File Prior to Visit  Medication Sig Dispense Refill  . amLODipine (NORVASC) 10 MG tablet TAKE 1 TABLET BY MOUTH EVERY DAY 90 tablet 1  . aspirin EC 81 MG tablet Take 81 mg by mouth at bedtime.     . bisoprolol-hydrochlorothiazide (ZIAC) 5-6.25 MG tablet Take 1 tablet Daily for BP 90 tablet 1  . Blood Glucose Monitoring Suppl (ACCU-CHEK AVIVA PLUS)  W/DEVICE KIT Check blood sugar up to three times daily 1 kit 0  . Cholecalciferol (VITAMIN D PO) Take 5,000 Units by mouth daily.    Marland Kitchen CINNAMON PO Take 1,000 mg by mouth 2 (two) times daily.     . citalopram (CELEXA) 40 MG tablet TAKE 1 TABLET DAILY FOR MOOD & ANXIETY 90 tablet 1  . clonazePAM (KLONOPIN) 0.5 MG tablet Take 1/2 to 1 tablet at Bedtime ONLY if Needed & please try to limit to 5 days /week to avoid addiction 90 tablet 0  . Continuous Blood Gluc Sensor (FREESTYLE LIBRE SENSOR SYSTEM) MISC Check sugars 3 x a day DX E10.9 1 each 0  . glucose blood (ACCU-CHEK AVIVA PLUS) test strip Use daily to check BS TID Dx. E11.22 300 each 1  . insulin NPH-regular Human (NOVOLIN 70/30) (70-30) 100 UNIT/ML injection INJECT 25 UNITS IN THE AM [place] WITH FOOD, AND INJECT 25 UNITS [place] WITH EVENING MEAL. 50 mL 2  . Insulin Syringe-Needle U-100 31G X 15/64" 0.5 ML MISC Use two pens daily with insulin 100 each 3  . Magnesium 500 MG TABS Take 500 mg by mouth daily.    Marland Kitchen olmesartan (BENICAR) 40 MG tablet TAKE 1 TABLET DAILY FOR BLOOD PRESSURE 90 tablet 1  . Omega-3 Fatty Acids (FISH OIL) 1200 MG CAPS Take 1,200 capsules by mouth daily.     Marland Kitchen omeprazole (PRILOSEC) 40 MG capsule TAKE 1 CAPSULE BY MOUTH EVERY DAY 90 capsule 2  . OVER THE COUNTER MEDICATION Take 1 tablet by mouth daily. One a day vitamin Woman's one a day    . rosuvastatin (CRESTOR) 40 MG tablet TAKE 1/2 TO 1 TABLET DAILY OR AS DIRECTED FOR CHOLESTEROL 90 tablet 1  . Semaglutide,0.25 or 0.'5MG'$ /DOS, (OZEMPIC, 0.25 OR 0.5 MG/DOSE,) 2 MG/1.5ML SOPN Inject 0.5 mg into  the skin once a week. 2 pen 5  . vitamin E 400 UNIT capsule Take 400 Units by mouth daily.     No current facility-administered medications on file prior to visit.    Allergies  Allergen Reactions  . Ciprofloxacin Other (See Comments)  . Plavix [Clopidogrel Bisulfate] Palpitations  . Amoxicillin Itching and Swelling    FACE & EYES SWELL  . Ace Inhibitors   . Ciprofloxacin Hcl Swelling   . Lyrica [Pregabalin]     Itch, gain weight  . Penicillins Other (See Comments)    REACTION: unspecified   Family History  Problem Relation Age of Onset  . CAD Mother 41       Died of MI  . Hypertension Mother   . Heart attack Mother   . Heart disease Mother   . CAD Father 75       Died of MI  . Heart disease Father   . Heart attack Father   . CAD Brother 54       Two brothers died of MI  . Heart attack Brother   . Heart disease Brother        Amputation  . Diabetes Sister   . Hypertension Sister   . Heart attack Brother   . Heart disease Brother   . Colon cancer Neg Hx   . Stomach cancer Neg Hx   . Esophageal cancer Neg Hx     PE: There were no vitals taken for this visit. Wt Readings from Last 3 Encounters:  01/18/19 185 lb (83.9 kg)  12/11/18 185 lb 9.6 oz (84.2 kg)  11/29/18 188 lb (85.3 kg)   Constitutional:  in NAD  The physical exam was not performed (virtual visit).  ASSESSMENT: 1. DM2, insulin-dependent, uncontrolled, with complications - PVD - s/p stents and fem-pop bypass - h/o CVA - CKD stage 3b - DR - PN  2.  Hyperlipidemia  3.  Obesity  PLAN:  1. Patient with longstanding, uncontrolled, type 2 diabetes, on premixed insulin regimen, to which we added a weekly GLP-1 receptor agonist at last visit.  At that time, HbA1c was very high, at 10.9%.  We discussed then about improving her diet, as this was mostly high fat and high salt.  I explained that reducing the fat in the salt will improve her insulin resistance and most likely allow her to decrease her insulin doses.  She was interested in coming off insulin, but this is only possible if she changes her diet completely.  We discussed about ways to improve her diet and given specific examples.  When sugars improved we need to check her insulin production.  We will do so when she returns to the clinic (now coronavirus pandemic). -At last visit, we also discussed about trying a SGLT2 inhibitor, but she  was having urinary symptoms at the moment (UTI- ended up being treated with Bactrim) so I did not suggest this.  Also, Invokana is not a good option for her due to the severe PVD.  Vania Rea and Wilder Glade may be options for her in the future. -At this visit, sugars are much better, not quite at goal, but significantly improved although she has been on the higher dose of Ozempic for only 4 weeks.  She is having some symptoms from improved blood sugars: Blurry vision, dizziness.  I explained that these are expected with such a significant improvement in her blood sugars.  I explained that these symptoms usually resolve after her body gets used to  the lower blood sugars.  However, for this reason, I would not want to intensify her regimen for now.  We also cannot decrease the insulin doses quite yet, since sugars are still above goal.  I plan to do this at next visit. - I suggested to:  Patient Instructions  Please continue: - Novolog 70/30 25 units twice a day 15 minutes before breakfast and dinner - Ozempic 0.5 mg weekly  Please return in 3 months with your sugar log.   - We will check her HbA1c when she returns to the clinic - continue checking sugars at different times of the day - check 2x a day, rotating checks - advised for yearly eye exams >> she is UTD - Return to clinic in 3 mo with sugar log   2. HL - Reviewed latest lipid panel from 10/2018 LDL at goal, HDL slightly low, triglycerides at goal Lab Results  Component Value Date   CHOL 134 11/13/2018   HDL 40 (L) 11/13/2018   LDLCALC 73 11/13/2018   LDLDIRECT 146.4 01/30/2010   TRIG 131 11/13/2018   CHOLHDL 3.4 11/13/2018  - Continues the statin without side effects.  3.  Obesity -Lost 7 pounds since starting Ozempic -Continue Ozempic which should also help with weight loss -I am hoping that we can reduce her premixed insulin doses in the near future.  This would help with weight loss, also.  Philemon Kingdom, MD PhD [facility]  Endocrinology

## 2019-02-01 ENCOUNTER — Telehealth: Payer: Self-pay | Admitting: *Deleted

## 2019-02-01 NOTE — Telephone Encounter (Signed)
Called patient and advised her Due to current COVID 19 pandemic, our office is severely reducing in person visits in order to minimize the risk to our patients and healthcare providers. We recommend to convert your appointment to a video visit. We'll take all precautions to reduce any security or privacy concerns. This will be treated like an office visit, and we will file with your insurance. She consented to video visit, stated she has used [email or web address] for visits. Her email: [email or web address]. Updated EMR. E mail sent.

## 2019-02-04 ENCOUNTER — Other Ambulatory Visit: Payer: Self-pay | Admitting: Internal Medicine

## 2019-02-04 DIAGNOSIS — F419 Anxiety disorder, unspecified: Secondary | ICD-10-CM

## 2019-02-05 ENCOUNTER — Ambulatory Visit (INDEPENDENT_AMBULATORY_CARE_PROVIDER_SITE_OTHER): Payer: Medicare Other | Admitting: Diagnostic Neuroimaging

## 2019-02-05 ENCOUNTER — Other Ambulatory Visit: Payer: Self-pay

## 2019-02-05 ENCOUNTER — Telehealth: Payer: Self-pay | Admitting: Diagnostic Neuroimaging

## 2019-02-05 ENCOUNTER — Encounter: Payer: Self-pay | Admitting: Diagnostic Neuroimaging

## 2019-02-05 DIAGNOSIS — I639 Cerebral infarction, unspecified: Secondary | ICD-10-CM

## 2019-02-05 DIAGNOSIS — I779 Disorder of arteries and arterioles, unspecified: Secondary | ICD-10-CM | POA: Diagnosis not present

## 2019-02-05 DIAGNOSIS — I671 Cerebral aneurysm, nonruptured: Secondary | ICD-10-CM | POA: Diagnosis not present

## 2019-02-05 DIAGNOSIS — I6381 Other cerebral infarction due to occlusion or stenosis of small artery: Secondary | ICD-10-CM

## 2019-02-05 NOTE — Progress Notes (Signed)
    Virtual Visit via Video Note  I connected with [name] on 02/05/19 at 10:30 AM EDT by a video enabled telemedicine application and verified that I am speaking with the correct person using two identifiers.   I discussed the limitations of evaluation and management by telemedicine and the availability of in person appointments. The patient expressed understanding and agreed to proceed.  Patient is at their home. I am at the office.    History of Present Illness:  - patient is doing well - no major headaches - here to follow up stroke prevention and h/o brain aneurysm vs atherosclerosis    Observations/Objective:  VIDEO EXAM  GENERAL EXAM/CONSTITUTIONAL:  Vitals: There were no vitals filed for this visit.  There is no height or weight on file to calculate BMI. Wt Readings from Last 3 Encounters:  01/18/19 185 lb (83.9 kg)  12/11/18 185 lb 9.6 oz (84.2 kg)  11/29/18 188 lb (85.3 kg)     Patient is in no distress; well developed, nourished and groomed; neck is supple   NEUROLOGIC: MENTAL STATUS:  No flowsheet data found.  awake, alert, oriented to person, place and time  recent and remote memory intact  normal attention and concentration  language fluent, comprehension intact, naming intact  fund of knowledge appropriate  CRANIAL NERVE:   2nd, 3rd, 4th, 6th - visual fields full to confrontation, extraocular muscles intact, no nystagmus  5th - facial sensation --> DECR IN RIGHT FACE  7th - facial strength symmetric  8th - hearing intact  11th - shoulder shrug symmetric  12th - tongue protrusion midline  MOTOR:   NO TREMOR; NO DRIFT IN BUE  SENSORY:   normal and symmetric to light touch; EXCEPT DECR IN RIGHT HAND AND RIGHT FOOT  COORDINATION:   fine finger movements normal   01/25/18 MRA head  - Mild irregularity the anterior genu of the cavernous carotid on the right with mild bulging projecting inferiorly is unchanged from prior  studies. This may represent atherosclerotic irregularity or a small 3 x 1 mm aneurysm, stable from the prior study.    Assessment and Plan:   73 y.o. female with unruptured brain aneurysm (vs atherosclerosis), diabetes, hyperlipidemia, hypertension, migraines, peripheral vascular disease, with left thalamic stroke on 01/25/17.   Dx: asymptomatic, unruptured aneurysm   1. Brain aneurysm   2. Thalamic stroke (HCC)     - check MRA head (surveillance) - stroke prevention --> aspirin 81, BP control, statin, diabetes control; optimize exercise and nutrition   Follow Up Instructions:  - Return in about 1 year (around 02/05/2020) for with NP (Amy Lomax).    I discussed the assessment and treatment plan with the patient. The patient was provided an opportunity to ask questions and all were answered. The patient agreed with the plan and demonstrated an understanding of the instructions.   The patient was advised to call back or seek an in-person evaluation if the symptoms worsen or if the condition fails to improve as anticipated.  I provided 25 minutes of non-face-to-face time during this encounter.   Penni Bombard, MD 2/54/2706, 23:76 AM Certified in Neurology, Neurophysiology and Neuroimaging  [facility] [address] [place], [place] [address] [phone]

## 2019-02-05 NOTE — Telephone Encounter (Signed)
Medicare/cigna order sent to GI. They will reach out to the pt to schedule.

## 2019-02-14 ENCOUNTER — Encounter: Payer: Self-pay | Admitting: *Deleted

## 2019-02-15 ENCOUNTER — Other Ambulatory Visit: Payer: Self-pay | Admitting: Internal Medicine

## 2019-02-15 DIAGNOSIS — F411 Generalized anxiety disorder: Secondary | ICD-10-CM

## 2019-02-21 ENCOUNTER — Telehealth: Payer: Self-pay | Admitting: Cardiology

## 2019-02-21 NOTE — Telephone Encounter (Signed)
smartphone/ has laptop if need video set up/ verbal consent/ my chart active/ pre reg completed

## 2019-02-25 NOTE — Progress Notes (Signed)
Virtual Visit via Video Note   This visit type was conducted due to national recommendations for restrictions regarding the COVID-19 Pandemic (e.g. social distancing) in an effort to limit this patient's exposure and mitigate transmission in our community.  Due to her co-morbid illnesses, this patient is at least at moderate risk for complications without adequate follow up.  This format is felt to be most appropriate for this patient at this time.  All issues noted in this document were discussed and addressed.  A limited physical exam was performed with this format.  Please refer to the patient's chart for her consent to telehealth for [facility]   Date:  02/26/2019   ID:  [name], DOB [birth year], MRN [id number]  Patient Location: Home Provider Location: Home  PCP:  [name], MD  Cardiologist:  Minus Breeding, MD  Electrophysiologist:  None   Evaluation Performed:  Follow-Up Visit  Chief Complaint:  Aortic atherosclerosis  History of Present Illness:    [name] is a 73 y.o. female for follow up of PVD.  She has had  peripheral vascular disease as below with stenting.  She is not had any prior cardiac history.  She has been noted in the past on CTs to follow-up pulmonary nodules to have aortic atherosclerosis and coronary artery atherosclerosis.  She had a stress test many years ago.  Echo in 2018 demonstrated NL systolic function.  In Feb she had negative Lexiscan Myoview.    She is being followed by Dr. Servando Snare for a lung mass.  They are planning further imaging.  She sees neurology and is going to get imaging of possible cerebral aneurysms.  She also sees vascular surgery.  She is doing relatively well from my standpoint.  She does get nauseated since starting Ozempic.  She has a little bit of shortness of breath but this is mild.  She is not describing any chest pressure, neck or arm discomfort.  She is not having any new palpitations, presyncope or syncope.   Has had no new symptoms since the negative stress test above.  The patient does not have symptoms concerning for COVID-19 infection (fever, chills, cough, or new shortness of breath).    Past Medical History:  Diagnosis Date  . Abnormal findings on esophagogastroduodenoscopy (EGD) 07/2010  . 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 Anxiety   . Colon polyps   . Diabetes mellitus 1998  . Diverticulosis   . ESOPHAGEAL STRICTURE 08/27/2008  . GERD (gastroesophageal reflux disease)   . Hemorrhoids   . Hiatal hernia   . Hyperlipidemia   . Hypertension 2000  . Migraines   . Peripheral vascular disease ([place])   . Phlebitis    30 years ago  left leg  . Stroke [facility])    multiple mini strokes ( brain aneurysm )   Past Surgical History:  Procedure Laterality Date  . ABDOMINAL AORTAGRAM N/A 01/04/2012   Procedure: ABDOMINAL Maxcine Ham;  Surgeon: Serafina Mitchell, MD;  Location: [facility] CATH LAB;  Service: Cardiovascular;  Laterality: N/A;  . ABDOMINAL AORTAGRAM N/A 08/15/2012   Procedure: ABDOMINAL Maxcine Ham;  Surgeon: Serafina Mitchell, MD;  Location: [facility] CATH LAB;  Service: Cardiovascular;  Laterality: N/A;  . ABDOMINAL HYSTERECTOMY  1984  . cataract surgery Right 10-22-2015  . cataract surgery Left 11-05-2015  . CESAREAN SECTION    . CHOLECYSTECTOMY    . COLONOSCOPY  07/2010  .  DENTAL SURGERY  Aug. 16, 2013   left lower   . ESOPHAGOGASTRODUODENOSCOPY  07/2010  . EYE SURGERY  Nov. 2014   Laser-Glaucoma  . FEMORAL ARTERY STENT  05/11/11   Left superficial femoral and popliteal artery  . HERNIA REPAIR     times two  . KNEE SURGERY    . LOWER EXTREMITY ANGIOGRAM Left 08/15/2012   Procedure: LOWER EXTREMITY ANGIOGRAM;  Surgeon: Serafina Mitchell, MD;  Location: [facility] CATH LAB;  Service: Cardiovascular;  Laterality: Left;  lt leg angio  . rotator cuff surgery    . THYROID SURGERY       Prior to Admission medications   Medication Sig Start Date End Date Taking?  Authorizing Provider  amLODipine (NORVASC) 10 MG tablet TAKE 1 TABLET BY MOUTH EVERY DAY 11/19/18  Yes Liane Comber, NP  aspirin EC 81 MG tablet Take 81 mg by mouth at bedtime.    Yes [provider]  bisoprolol-hydrochlorothiazide [facility]) 5-6.25 MG tablet Take 1 tablet Daily for BP 12/03/18  Yes [name], MD  Blood Glucose Monitoring Suppl (ACCU-CHEK AVIVA PLUS) W/DEVICE KIT Check blood sugar up to three times daily 03/26/15  Yes [name], PA-C  Cholecalciferol (VITAMIN D PO) Take 5,000 Units by mouth daily.   Yes [provider]  CINNAMON PO Take 1,000 mg by mouth 2 (two) times daily.    Yes [provider]  citalopram (CELEXA) 40 MG tablet Take 1 tablet Daily for Mood & Anxiety 02/04/19  Yes [name], MD  clonazePAM (KLONOPIN) 0.5 MG tablet TAKE 1/2 TO 1 TABLET AT BEDTIME ONLY IF NEEDED & TRY LIMIT TO 5 DAYS /WEEK TO AVOID ADDICTION 02/15/19  Yes [name], PA-C  Continuous Blood Gluc Sensor (FREESTYLE LIBRE SENSOR SYSTEM) MISC Check sugars 3 x a day DX E10.9 11/13/18  Yes [name], PA-C  glucose blood (ACCU-CHEK AVIVA PLUS) test strip Use daily to check BS TID Dx. E11.22 08/05/15  Yes Ollis, Courtney, PA-C  insulin NPH-regular Human (NOVOLIN 70/30) (70-30) 100 UNIT/ML injection INJECT 25 UNITS IN THE AM [place] WITH FOOD, AND INJECT 25 UNITS [place] WITH EVENING MEAL. 11/10/18  Yes [name], MD  Insulin Syringe-Needle U-100 31G X 15/64" 0.5 ML MISC Use two pens daily with insulin 02/23/16  Yes [name], PA-C  Magnesium 500 MG TABS Take 500 mg by mouth daily.   Yes [provider]  olmesartan (BENICAR) 40 MG tablet TAKE 1 TABLET DAILY FOR BLOOD PRESSURE 11/19/18  Yes Liane Comber, NP  Omega-3 Fatty Acids (FISH OIL) 1200 MG CAPS Take 1,200 capsules by mouth daily.    Yes [provider]  omeprazole (PRILOSEC) 40 MG capsule TAKE 1 CAPSULE BY MOUTH EVERY DAY 11/17/18  Yes [name], PA-C  OVER THE COUNTER MEDICATION  Take 1 tablet by mouth daily. One a day vitamin Woman's one a day   Yes [provider]  rosuvastatin (CRESTOR) 40 MG tablet TAKE 1/2 TO 1 TABLET DAILY OR AS DIRECTED FOR CHOLESTEROL 12/22/18  Yes [name], MD  Semaglutide,0.25 or 0.5MG/DOS, (OZEMPIC, 0.25 OR 0.5 MG/DOSE,) 2 MG/1.5ML SOPN Inject 0.5 mg into the skin once a week. 11/29/18  Yes Philemon Kingdom, MD  vitamin E 400 UNIT capsule Take 400 Units by mouth daily.   Yes [provider]    Allergies:   Ciprofloxacin; Plavix [clopidogrel bisulfate]; Amoxicillin; Ace inhibitors; Ciprofloxacin hcl; Lyrica [pregabalin]; and Penicillins   Social History   Tobacco Use  . Smoking status: Current Every Day Smoker  Packs/day: 0.25    Years: 40.00    Pack years: 10.00    Types: Cigarettes  . Smokeless tobacco: Never Used  Substance Use Topics  . Alcohol use: No    Alcohol/week: 0.0 standard drinks  . Drug use: No     Family Hx: The patient's family history includes CAD (age of onset: 2) in her brother; CAD (age of onset: 51) in her father and mother; Diabetes in her sister; Heart attack in her brother, brother, father, and mother; Heart disease in her brother, brother, father, and mother; Hypertension in her mother and sister. There is no history of Colon cancer, Stomach cancer, or Esophageal cancer.  ROS:   Please see the history of present illness.    As stated in the HPI and negative for all other systems. All other systems reviewed and are negative.   Prior CV studies:   The following studies were reviewed today:  Labs  Labs/Other Tests and Data Reviewed:    EKG:  No ECG reviewed.  Recent Labs: 11/13/2018: ALT 21; BUN 16; Creat 1.59; Hemoglobin 16.5; Magnesium 1.9; Platelets 243; Potassium 4.8; Sodium 143; TSH 1.65   Recent Lipid Panel Lab Results  Component Value Date/Time   CHOL 134 11/13/2018 11:29 AM   TRIG 131 11/13/2018 11:29 AM   HDL 40 (L) 11/13/2018 11:29 AM   CHOLHDL 3.4 11/13/2018  11:29 AM   LDLCALC 73 11/13/2018 11:29 AM   LDLDIRECT 146.4 01/30/2010 10:56 AM    Wt Readings from Last 3 Encounters:  02/26/19 181 lb (82.1 kg)  01/18/19 185 lb (83.9 kg)  12/11/18 185 lb 9.6 oz (84.2 kg)     Objective:    Vital Signs:  BP (!) 163/68   Pulse 66   Ht _0  (1.676 m)   Wt 181 lb (82.1 kg)   BMI 29.21 kg/m    VITAL SIGNS:  reviewed GEN:  no acute distress EYES:  sclerae anicteric, EOMI - Extraocular Movements Intact RESPIRATORY:  normal respiratory effort, symmetric expansion NEURO:  alert and oriented x 3, no obvious focal deficit PSYCH:  normal affect  ASSESSMENT & PLAN:     AORTIC CALCIFICATION:    She had no evidence of obstructive coronary disease and she will continue with aggressive risk reduction.   CORONARY CALCIFICATION:   She had a negative Lexiscan Myoview.  As above.  SOB:    This is chronic and mild.  No change in therapy.  HTN:      Blood pressure is mildly elevated.  And then increase her bisoprolol to 10 mg and a combination pill.  Her heart rates a little bit low and she will let me know if she gets lightheaded.  DYSLIPIDEMIA: Her LDL was 73 with an HDL of 40.  I am going to ease her into further therapy and may want to try to get her on Zetia in the future.  DM: Her A1c is greater than 10.   She is now having this followed by Dr. Cruzita Lederer.  She is having some nausea probably related to the medication and she will call that office to discuss  TOBACCO ABUSE:    She has previously been reluctant to discuss this.  I will continue to try to educate.  PVD:   Follow-up as above.  He is seeing vascular surgery.  COVID-19 Education: The signs and symptoms of COVID-19 were discussed with the patient and how to seek care for testing (follow up with PCP or arrange E-visit).  The  importance of social distancing was discussed today.  Time:   Today, I have spent 25 minutes with the patient with telehealth technology discussing the above  problems.     Medication Adjustments/Labs and Tests Ordered: Current medicines are reviewed at length with the patient today.  Concerns regarding medicines are outlined above.   Tests Ordered: No orders of the defined types were placed in this encounter.   Medication Changes: No orders of the defined types were placed in this encounter.   Disposition:  Follow up in the office in four months with me.  Signed, Minus Breeding, MD  02/26/2019 10:54 AM    [facility]

## 2019-02-26 ENCOUNTER — Encounter: Payer: Self-pay | Admitting: Cardiology

## 2019-02-26 ENCOUNTER — Telehealth (INDEPENDENT_AMBULATORY_CARE_PROVIDER_SITE_OTHER): Payer: Medicare Other | Admitting: Cardiology

## 2019-02-26 VITALS — BP 163/68 | HR 66 | Ht 66.0 in | Wt 181.0 lb

## 2019-02-26 DIAGNOSIS — I10 Essential (primary) hypertension: Secondary | ICD-10-CM

## 2019-02-26 DIAGNOSIS — E1151 Type 2 diabetes mellitus with diabetic peripheral angiopathy without gangrene: Secondary | ICD-10-CM

## 2019-02-26 DIAGNOSIS — R0602 Shortness of breath: Secondary | ICD-10-CM

## 2019-02-26 DIAGNOSIS — Z7189 Other specified counseling: Secondary | ICD-10-CM

## 2019-02-26 DIAGNOSIS — Z006 Encounter for examination for normal comparison and control in clinical research program: Secondary | ICD-10-CM

## 2019-02-26 DIAGNOSIS — I779 Disorder of arteries and arterioles, unspecified: Secondary | ICD-10-CM | POA: Insufficient documentation

## 2019-02-26 DIAGNOSIS — I700 Atherosclerosis of aorta: Secondary | ICD-10-CM

## 2019-02-26 MED ORDER — BISOPROLOL-HYDROCHLOROTHIAZIDE 10-6.25 MG PO TABS: 1.0000 | ORAL_TABLET | Freq: Every day | ORAL | 3 refills | Status: DC

## 2019-02-26 NOTE — Patient Instructions (Signed)
Medication Instructions:  INCREASE- Bisoprolol/HCTZ 10-6.25 mg daily  If you need a refill on your cardiac medications before your next appointment, please call your pharmacy.  Labwork: None Ordered   Testing/Procedures: None Ordered   Follow-Up: You will need a follow up appointment in 4 months.  Please call our office 2 months in advance to schedule this appointment.  You may see Minus Breeding, MD or one of the following Advanced Practice Providers on your designated Care Team:   Rosaria Ferries, PA-C . Jory Sims, DNP, ANP      At [facility], you and your health needs are our priority.  As part of our continuing mission to provide you with exceptional heart care, we have created designated Provider Care Teams.  These Care Teams include your primary Cardiologist (physician) and Advanced Practice Providers (APPs -  Physician Assistants and Nurse Practitioners) who all work together to provide you with the care you need, when you need it.  Thank you for choosing [facility] at [facility]!!

## 2019-03-05 ENCOUNTER — Ambulatory Visit
Admission: RE | Admit: 2019-03-05 | Discharge: 2019-03-05 | Disposition: A | Discharge status: Home / Self Care | Payer: Medicare Other | Source: Ambulatory Visit | Attending: Diagnostic Neuroimaging | Admitting: Diagnostic Neuroimaging

## 2019-03-05 ENCOUNTER — Other Ambulatory Visit: Payer: Self-pay

## 2019-03-05 DIAGNOSIS — I671 Cerebral aneurysm, nonruptured: Secondary | ICD-10-CM

## 2019-03-13 ENCOUNTER — Telehealth: Payer: Self-pay | Admitting: *Deleted

## 2019-03-13 NOTE — Telephone Encounter (Signed)
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 Call with any problem, concerns. She stated she had seen her results on my chart,  verbalized understanding, appreciation.

## 2019-03-19 ENCOUNTER — Ambulatory Visit: Payer: Medicare Other | Admitting: Podiatry

## 2019-04-02 DIAGNOSIS — E113293 Type 2 diabetes mellitus with mild nonproliferative diabetic retinopathy without macular edema, bilateral: Secondary | ICD-10-CM | POA: Diagnosis not present

## 2019-04-02 DIAGNOSIS — H04123 Dry eye syndrome of bilateral lacrimal glands: Secondary | ICD-10-CM | POA: Diagnosis not present

## 2019-04-02 DIAGNOSIS — H04221 Epiphora due to insufficient drainage, right lacrimal gland: Secondary | ICD-10-CM | POA: Diagnosis not present

## 2019-04-02 DIAGNOSIS — H43391 Other vitreous opacities, right eye: Secondary | ICD-10-CM | POA: Diagnosis not present

## 2019-04-10 ENCOUNTER — Telehealth: Payer: Self-pay | Admitting: Internal Medicine

## 2019-04-10 ENCOUNTER — Encounter: Payer: Self-pay | Admitting: *Deleted

## 2019-04-10 DIAGNOSIS — Z006 Encounter for examination for normal comparison and control in clinical research program: Secondary | ICD-10-CM

## 2019-04-10 NOTE — Research (Signed)
Marland KitchenMarland KitchenMarland KitchenMarland KitchenMarland KitchenMarland KitchenMarland Kitchen   COORDINATE-Diabetes 3 Month Patient Questionnaire (Intervention) Site #:   811   Patient ID:            [id number]       7 WGNFA QUESTIONNAIRE  Visit Date     04/10/2019     MM DD YYYY  Vital Status [x]   Patient Alive > Proceed to Visit Status []   Patient Dead > Complete Death Form only []   Unknown > Proceed to Visit Status  Visit Status Was the interview completed? []  No >IF NO, Select reason why [] Unable to locate                                    [] No Valid Contacts (patients or alternates) [] Multiple attempts to valid contacts []  Patient no longer cared for at study clinic []  Patient withdrew []  Other, specify:                                           >IF NO, Last date of contact: / /             MM      DD        YYYY    [x] Yes >IF YES, Select source of Interview: []   Proxy [x]   Patient     3 Month Patient Questionnaire (Intervention)  Instructions:   1. Prior to speaking with the patient either over the phone or in person, print off or have available the patient's current list of medications.      IF conducting follow-up visit over the phone - Instruct the patient to gather all their pill bottles. 2. For the purpose of the COORDINATE-Diabetes Study, please document whether the patient is currently taking each of the following medication classes. 3. DO NOT PROMPT DURING FOLLOW-UP VISIT: IF subject mentions reaction to one of the following medications, complete the AE/SAE section and follow instructions in operations manual regarding Adverse event reporting to [employer] ([facility]).  MEDICATIONS  Medication Are you currently taking [insert name of previous med]? If currently taking: If started since last visit: If stopped since last visit:  ACE Inhibitor / Angiotensin Receptor Blocker (ARB) / Angiotensin Receptor Neprilysin inhibitor (ARNi) [] No >  [x] Yes > [] Stopped since last visit [] Never prescribed [] Started since last visit [x] Same medication as last      visit [] Different medication than       last visit [Study personnel to answer]  Documented in EHR? [] No  [] Yes   If no, Verified by: [] Photo of bottle [] Copy of rx [] Dispensing       pharmacy [] Prescribing provider [] Other (specify):                    Date started:        / /             MM DD YYYY Who prescribed this medication for you? [] Cardiology provider > [] Study clinic [] Outside clinic [] Endocrinology provider [] Primary care provider [] Other provider > Specify:                             [] Unknown Date discontinued:        / /  MM DD YYYY Why did you stop taking this medication? (check all that apply) [] Allergic reaction [] Medication side effects [] Unable to       adhere/monitor [] Had an      operation/procedure      that required      stopping it [] Unable to afford it [] No longer wants        to take        this medication [] Provider decision [] Pregnancy [] Other (specify: ) [] Unknown Reason   If started or changed  What medication are you taking now? [] Benazepril (Lotensin) [] Captopril (Capoten) [] Enalapril (Vasotec) [] Fosinopril (Monopril) [] Lisinopril (Zestril, Prinivil) [] Quinapril (Accupril) [] Ramipril (Altace) [] Azilsartan (Edarbi) [] Candesartan (Atacand) [] Irbesartan (Avapro) [] Losartan (Cozaar) [] Olmesartan (Benicar) [] Telmisartan (Micardis) [] Valsartan (Diovan) [] Sacrubitril/Valsartan Delene Loll)      Medication Are you currently taking [insert name of previous med]? If currently taking: If started since last visit: If stopped since last visit:  Statin []  No >  [x] Yes > [] Stopped since last visit [] Never prescribed [] Started since last visit [x] Same medication as last        visit [] Different medication or dose     than last visit [Study personnel to answer]  Documented in EHR? [] No [] Yes    If no, Verified by: [] Photo of bottle [] Copy of rx [] Dispensing pharmacy [] Prescribing  provider [] Other (specify):                    Date started:        / /             MM DD YYYY Who prescribed this medication for you? [] Cardiology provider  [] Study clinic [] Outside clinic [] Endocrinology provider [] Primary care provider [] Other provider  Specify:                             [] Unknown Date discontinued:        / /             MM DD YYYY Why did you stop taking this medication? (check all that apply) [] Allergic reaction [] Medication side effects  [] Muscle aches [] Weakness [] Joint pain [] Cognitive symptoms [] Other (specify):                          [] Unable to        adhere/monitor [] Had an    operation/procedure    that required stopping it [] Unable to afford it [] No longer wants       to take this            medication [] Provider decision [] Pregnancy [] Other (specify: ) [] Unknown Reason   If started or changed  What medication are you taking now? [] Atorvastatin (Lipitor) [] Fluvastatin (Lescol) [] Lovastatin (Mevacor) [] Pravastatin (Pravachol) [] Rosuvastatin (Crestor) [] Simvastatin (Zocor) [] Pitatavastatin (Livalo)  Dose: [] 1 mg  [] 10 mg [] 2 mg  []  20 mg [] 3 mg  []  40 mg [] 4 mg  []  60 mg [] 5 mg  []  80 mg  Frequency: [] Daily []  Less than daily      Medication Are you currently taking [insert name of previous med]? If currently taking: If started since last visit: If stopped since last visit:  SGLT2 Inhibitor [] No   [] Yes [] Stopped since last visit [x] Never prescribed [] Started since last visit [] Same medication as last      visit [] Different medication than last visit [Study personnel to answer]  Documented in EHR? [] No [] Yes   If no, Verified by: [] Photo  of bottle [] Copy of rx [] Dispensing       pharmacy [] Prescribing       provider [] Other (specify):                    Date started:        / /             MM DD YYYY Who prescribed this medication for you? [] Cardiology provider  [] Study clinic [] Outside  clinic [] Endocrinology provider [] Primary care provider [] Other provider  Specify:                             [] Unknown Date discontinued:        / /             MM DD YYYY Why did you stop taking this medication? (check all that apply) [] Allergic reaction [] Medication side effects [] Unable to       adhere/monitor [] Had an        operation/procedure         that required           stopping it [] Patient unable to       afford it [] Patient no longer       wants to       take this medication [] Provider decision [] Pregnancy [] Other (specify: ) [] Unknown Reason   If started or changed  What medication are you taking now? [] Canaglifozin (Invokana) [] Dapagliflozin (Farxiga) [] Empaglifozin (Jardiance) [] Ertugliflozin (Steglatro)     GLP1 Receptor Agonist [] No   [x] Yes [] Stopped since last visit [] Never prescribed [] Started since last visit [x] Same medication as last      visit [] Different medication than       last visit [Study personnel to answer]  Documented in EHR? [] No [] Yes   If no, Verified by: [] Photo of bottle [] Copy of rx [] Dispensing       pharmacy [] Prescribing       provider [] Other (specify):                    Date started:        / /             MM DD YYYY Who prescribed this medication for you? [] Cardiology provider  [] Study clinic [] Outside clinic [] Endocrinology provider [] Primary care provider [] Other provider  Specify:                             [] Unknown Date discontinued:        / /             MM DD YYYY Why did you stop taking this medication? (check all that apply) [] Allergic reaction [] Medication side effects [] Unable to      adhere/monitor [] Had an    operation/procedure    that required stopping it [] Patient unable to        afford it [] Patient no longer        wants to take this        medication [] Provider decision [] Pregnancy [] Other (specify: ) [] Unknown Reason   If started or changed  What medication  are you taking now? [] Albiglutide (Tanzeum) [] Dulaglutide (Trulicity) [] Exanatide (Byetta, Bydureon) [] Liraglutide (Victoza, Saxenda) [] Lixisenatide (Adlyxin) [] Semaglutice (Ozempic)     11/28/2018 (EDC Release)

## 2019-04-10 NOTE — Telephone Encounter (Signed)
We can advise her to try to decrease the dose back to 0.25 mg weekly, which I think she told me she tolerated well.  She can also stay off the medication for the next 2 weeks before starting the lower dose.  Please ask her to let us know how she is doing.

## 2019-04-10 NOTE — Telephone Encounter (Signed)
Patient called re: patient is experiencing nausea and vomiting since starting Ozempic. Please call patient at ph# [phone] to advise.

## 2019-04-19 ENCOUNTER — Encounter: Payer: Self-pay | Admitting: Cardiothoracic Surgery

## 2019-04-19 ENCOUNTER — Ambulatory Visit
Admission: RE | Admit: 2019-04-19 | Discharge: 2019-04-19 | Disposition: A | Discharge status: Home / Self Care | Payer: Medicare Other | Source: Ambulatory Visit | Attending: Cardiothoracic Surgery | Admitting: Cardiothoracic Surgery

## 2019-04-19 ENCOUNTER — Ambulatory Visit (INDEPENDENT_AMBULATORY_CARE_PROVIDER_SITE_OTHER): Payer: Medicare Other | Admitting: Cardiothoracic Surgery

## 2019-04-19 ENCOUNTER — Other Ambulatory Visit: Payer: Self-pay

## 2019-04-19 VITALS — BP 155/62 | HR 63 | Temp 97.5°F | Resp 18 | Ht 66.0 in | Wt 187.0 lb

## 2019-04-19 DIAGNOSIS — I779 Disorder of arteries and arterioles, unspecified: Secondary | ICD-10-CM | POA: Diagnosis not present

## 2019-04-19 DIAGNOSIS — R911 Solitary pulmonary nodule: Secondary | ICD-10-CM

## 2019-04-19 DIAGNOSIS — J432 Centrilobular emphysema: Secondary | ICD-10-CM | POA: Diagnosis not present

## 2019-04-19 NOTE — Patient Instructions (Signed)
Pulmonary Nodule A pulmonary nodule is a small, round growth of tissue in the lung. It is sometimes referred to as a "shadow" or "spot on the lung." Nodules range in size from less than 1/5 of an inch (4 mm) to a little bigger than an inch (30 mm). Pulmonary nodules can be either noncancerous (benign) or cancerous (malignant). Most are noncancerous. Smaller nodules in people who do not smoke and do not have any other risk factors for lung cancer are more likely to be noncancerous. Larger, irregular nodules in people who smoke or who have a strong family history of lung cancer are more likely to be cancerous. What are the causes? This condition may be caused by:  A bacterial, fungal, or viral infection, such as tuberculosis. The infection is usually an old and inactive one.  A noncancerous mass of tissue.  Inflammation from conditions such as rheumatoid arthritis.  Abnormal blood vessels in the lungs.  Cancerous tissue, such as lung cancer or a cancer in another part of the body that has spread to the lung. What are the signs or symptoms? This condition usually does not cause symptoms. If symptoms appear, they are usually related to the underlying cause. For example, if the condition is caused by an infection, you may have a cough or fever. How is this diagnosed? This condition is usually diagnosed with an X-ray or CT scan. To help determine whether a pulmonary nodule is benign or malignant, your health care provider will:  Take your medical history.  Perform a physical exam.  Order tests, including: ? Blood tests. ? A skin test called a tuberculin test. This test is done to check if you have been exposed to the germ that causes tuberculosis. ? Chest X-rays. ? A CT scan. This test shows smaller pulmonary nodules more clearly and with more detail than an X-ray. ? A positron emission tomography (PET) scan. This test is done to check if the nodule is cancerous. During the test, a safe amount  of a radioactive substance is injected into the bloodstream. Then a picture is taken. ? Biopsy. In this test, a tiny piece of the pulmonary nodule is removed and then examined under a microscope. How is this treated? Treatment for this condition depends on whether the pulmonary nodule is malignant or benign as well as your risk of getting cancer.  Noncancerous nodules usually do not need to be treated, but they may need to be monitored with CT scans. If a CT scan shows that the pulmonary nodule got bigger, more tests may be done.  Some nodules need to be removed. If this is the case, you may have a procedure called a thoractomy. During the procedure, your health care provider will make an incision in your chest and remove the part of the lung where the nodule is located. Follow these instructions at home:   Take over-the-counter and prescription medicines only as told by your health care provider.  Do not use any products that contain nicotine or tobacco, such as cigarettes and e-cigarettes. If you need help quitting, ask your health care provider.  Keep all follow-up visits as told by your health care provider. This is important. Contact a health care provider if:  You have trouble breathing when you are active.  You feel sick or unusually tired.  You do not feel like eating.  You lose weight without trying.  You develop chills or night sweats. Get help right away if:  You cannot catch your breath.    You begin wheezing.  You cannot stop coughing.  You cough up blood.  You become dizzy or feel like you are going to faint.  You have sudden chest pain.  You have a fever or persistent symptoms for more than 2-3 days.  You have a fever and your symptoms suddenly get worse. Summary  A pulmonary nodule is a small, round growth of tissue in the lung. Most pulmonary nodules are noncancerous.  This condition is usually diagnosed with an X-ray or CT scan.  Common causes of  pulmonary nodules include infection, inflammation, and noncancerous growths.  Though less common, if a nodule is found to be cancerous, you will need specific diagnostic tests and treatment options as directed by your medical provider.  Treatment for this condition depends on whether the pulmonary nodule is benign or malignant as well as your risk of getting cancer. This information is not intended to replace advice given to you by your health care provider. Make sure you discuss any questions you have with your health care provider. Document Released: 07/04/2009 Document Revised: 09/30/2017 Document Reviewed: 10/05/2016 Elsevier Patient Education  2020 [employer]. Steps to Quit Smoking Smoking tobacco is the leading cause of preventable death. It can affect almost every organ in the body. Smoking puts you and those around you at risk for developing many serious chronic diseases. Quitting smoking can be difficult, but it is one of the best things that you can do for your health. It is never too late to quit. How do I get ready to quit? When you decide to quit smoking, create a plan to help you succeed. Before you quit:  Pick a date to quit. Set a date within the next 2 weeks to give you time to prepare.  Write down the reasons why you are quitting. Keep this list in places where you will see it often.  Tell your family, friends, and co-workers that you are quitting. Support from your loved ones can make quitting easier.  Talk with your health care provider about your options for quitting smoking.  Find out what treatment options are covered by your health insurance.  Identify people, places, things, and activities that make you want to smoke (triggers). Avoid them. What first steps can I take to quit smoking?  Throw away all cigarettes at home, at work, and in your car.  Throw away smoking accessories, such as Scientist, research (medical).  Clean your car. Make sure to empty the ashtray.   Clean your home, including curtains and carpets. What strategies can I use to quit smoking? Talk with your health care provider about combining strategies, such as taking medicines while you are also receiving in-person counseling. Using these two strategies together makes you more likely to succeed in quitting than if you used either strategy on its own.  If you are pregnant or breastfeeding, talk with your health care provider about finding counseling or other support strategies to quit smoking. Do not take medicine to help you quit smoking unless your health care provider tells you to do so. To quit smoking: Quit right away  Quit smoking completely, instead of gradually reducing how much you smoke over a period of time. Research shows that stopping smoking right away is more successful than gradually quitting.  Attend in-person counseling to help you build problem-solving skills. You are more likely to succeed in quitting if you attend counseling sessions regularly. Even short sessions of 10 minutes can be effective. Take medicine You may take  medicines to help you quit smoking. Some medicines require a prescription and some you can purchase over-the-counter. Medicines may have nicotine in them to replace the nicotine in cigarettes. Medicines may:  Help to stop cravings.  Help to relieve withdrawal symptoms. Your health care provider may recommend:  Nicotine patches, gum, or lozenges.  Nicotine inhalers or sprays.  Non-nicotine medicine that is taken by mouth. Find resources Find resources and support systems that can help you to quit smoking and remain smoke-free after you quit. These resources are most helpful when you use them often. They include:  Online chats with a Social worker.  Telephone quitlines.  Printed Furniture conservator/restorer.  Support groups or group counseling.  Text messaging programs.  Mobile phone apps or applications. Use apps that can help you stick to your quit  plan by providing reminders, tips, and encouragement. There are many free apps for mobile devices as well as websites. Examples include Quit Guide from the [employer] and smokefree.gov What things can I do to make it easier to quit?   Reach out to your family and friends for support and encouragement. Call telephone quitlines (1-800-QUIT-NOW), reach out to support groups, or work with a counselor for support.  Ask people who smoke to avoid smoking around you.  Avoid places that trigger you to smoke, such as bars, parties, or smoke-break areas at work.  Spend time with people who do not smoke.  Lessen the stress in your life. Stress can be a smoking trigger for some people. To lessen stress, try: ? Exercising regularly. ? Doing deep-breathing exercises. ? Doing yoga. ? Meditating. ? Performing a body scan. This involves closing your eyes, scanning your body from head to toe, and noticing which parts of your body are particularly tense. Try to relax the muscles in those areas. How will I feel when I quit smoking? Day 1 to 3 weeks Within the first 24 hours of quitting smoking, you may start to feel withdrawal symptoms. These symptoms are usually most noticeable 2-3 days after quitting, but they usually do not last for more than 2-3 weeks. You may experience these symptoms:  Mood swings.  Restlessness, anxiety, or irritability.  Trouble concentrating.  Dizziness.  Strong cravings for sugary foods and nicotine.  Mild weight gain.  Constipation.  Nausea.  Coughing or a sore throat.  Changes in how the medicines that you take for unrelated issues work in your body.  Depression.  Trouble sleeping (insomnia). Week 3 and afterward After the first 2-3 weeks of quitting, you may start to notice more positive results, such as:  Improved sense of smell and taste.  Decreased coughing and sore throat.  Slower heart rate.  Lower blood pressure.  Clearer skin.  The ability to breathe  more easily.  Fewer sick days. Quitting smoking can be very challenging. Do not get discouraged if you are not successful the first time. Some people need to make many attempts to quit before they achieve long-term success. Do your best to stick to your quit plan, and talk with your health care provider if you have any questions or concerns. Summary  Smoking tobacco is the leading cause of preventable death. Quitting smoking is one of the best things that you can do for your health.  When you decide to quit smoking, create a plan to help you succeed.  Quit smoking right away, not slowly over a period of time.  When you start quitting, seek help from your health care provider, family, or friends.  This information is not intended to replace advice given to you by your health care provider. Make sure you discuss any questions you have with your health care provider. Document Released: 08/31/2001 Document Revised: 11/24/2018 Document Reviewed: 11/25/2018 Elsevier Patient Education  2020 [employer].

## 2019-04-19 NOTE — Progress Notes (Signed)
[address]       [place] [address]             [phone]                    [name] Bent Medical Record #[id number] Date of Birth: 15-Feb-1946  Referring: [name], PA-C Primary Care: [name], MD Primary Cardiologist: Minus Breeding, MD  Chief Complaint:    Chief Complaint  Patient presents with   Lung Lesion    f/u with Super D CT today    History of Present Illness:    [name] 73 y.o. female is seen in the office  today for follow-up CT scan for pulmonary nodules.  The patient is a long-term smoker smoking approximately a third of a pack a day.  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    She is referred by primary care because of abnormal CT scan of the chest.  On her previous visit it was apparent that the CT scan readings at the sides reversed repeat radiology review of this scan was done.  This is been corrected and record  Current Activity/ Functional Status:  Patient is independent with mobility/ambulation, transfers, ADL's, IADL's.   Zubrod Score: At the time of surgery this patients most appropriate activity status/level should be described as: '[]'$     0    Normal activity, no symptoms '[x]'$     1    Restricted in physical strenuous activity but ambulatory, able to do out light work '[]'$     2    Ambulatory and capable of self care, unable to do work activities, up and about               >50 % of waking hours                              '[]'$     3    Only limited self care, in bed greater than 50% of waking hours '[]'$     4    Completely disabled, no self care, confined to bed or chair '[]'$     5    Moribund   Past Medical History:  Diagnosis Date   Abnormal findings on esophagogastroduodenoscopy (EGD) 07/2010   Aneurysm ([place]) 2013   Right Brain    Aneurysm ([place])    in brain x 2   Anxiety    Colon polyps    Diabetes mellitus  1998   Diverticulosis    ESOPHAGEAL STRICTURE 08/27/2008   GERD (gastroesophageal reflux disease)    Hemorrhoids    Hiatal hernia    Hyperlipidemia    Hypertension 2000   Migraines    Peripheral vascular disease ([place])    Phlebitis    30 years ago  left leg   Stroke [facility])    multiple mini strokes ( brain aneurysm )    Past Surgical History:  Procedure Laterality Date   ABDOMINAL AORTAGRAM N/A 01/04/2012   Procedure: ABDOMINAL Maxcine Ham;  Surgeon: Serafina Mitchell, MD;  Location: [facility] CATH LAB;  Service: Cardiovascular;  Laterality: N/A;   ABDOMINAL AORTAGRAM N/A 08/15/2012   Procedure: ABDOMINAL Maxcine Ham;  Surgeon: Serafina Mitchell, MD;  Location: [facility] CATH LAB;  Service: Cardiovascular;  Laterality: N/A;   ABDOMINAL HYSTERECTOMY  1984   cataract surgery Right 10-22-2015  cataract surgery Left 11-05-2015   CESAREAN SECTION     CHOLECYSTECTOMY     COLONOSCOPY  07/2010   DENTAL SURGERY  Aug. 16, 2013   left lower    ESOPHAGOGASTRODUODENOSCOPY  07/2010   EYE SURGERY  Nov. 2014   Laser-Glaucoma   FEMORAL ARTERY STENT  05/11/11   Left superficial femoral and popliteal artery   HERNIA REPAIR     times two   KNEE SURGERY     LOWER EXTREMITY ANGIOGRAM Left 08/15/2012   Procedure: LOWER EXTREMITY ANGIOGRAM;  Surgeon: Serafina Mitchell, MD;  Location: [facility] CATH LAB;  Service: Cardiovascular;  Laterality: Left;  lt leg angio   rotator cuff surgery     THYROID SURGERY      Family History  Problem Relation Age of Onset   CAD Mother 25       Died of MI   Hypertension Mother    Heart attack Mother    Heart disease Mother    CAD Father 47       Died of MI   Heart disease Father    Heart attack Father    CAD Brother 34       Two brothers died of MI   Heart attack Brother    Heart disease Brother        Amputation   Diabetes Sister    Hypertension Sister    Heart attack Brother    Heart disease Brother    Colon cancer Neg Hx    Stomach  cancer Neg Hx    Esophageal cancer Neg Hx      Social History   Tobacco Use  Smoking Status Current Every Day Smoker   Packs/day: 0.25   Years: 40.00   Pack years: 10.00   Types: Cigarettes  Smokeless Tobacco Never Used    Social History   Substance and Sexual Activity  Alcohol Use No   Alcohol/week: 0.0 standard drinks     Allergies  Allergen Reactions   Ciprofloxacin Other (See Comments)   Plavix [Clopidogrel Bisulfate] Palpitations   Amoxicillin Itching and Swelling    FACE & EYES SWELL   Ace Inhibitors    Ciprofloxacin Hcl Swelling   Lyrica [Pregabalin]     Itch, gain weight   Penicillins Other (See Comments)    REACTION: unspecified    Current Outpatient Medications  Medication Sig Dispense Refill   amLODipine (NORVASC) 10 MG tablet TAKE 1 TABLET BY MOUTH EVERY DAY 90 tablet 1   aspirin EC 81 MG tablet Take 81 mg by mouth at bedtime.      bisoprolol-hydrochlorothiazide (ZIAC) 10-6.25 MG tablet Take 1 tablet by mouth daily. 90 tablet 3   Blood Glucose Monitoring Suppl (ACCU-CHEK AVIVA PLUS) W/DEVICE KIT Check blood sugar up to three times daily 1 kit 0   Cholecalciferol (VITAMIN D PO) Take 5,000 Units by mouth daily.     CINNAMON PO Take 1,000 mg by mouth 2 (two) times daily.      citalopram (CELEXA) 40 MG tablet Take 1 tablet Daily for Mood & Anxiety 90 tablet 1   clonazePAM (KLONOPIN) 0.5 MG tablet TAKE 1/2 TO 1 TABLET AT BEDTIME ONLY IF NEEDED & TRY LIMIT TO 5 DAYS /WEEK TO AVOID ADDICTION 30 tablet 2   Continuous Blood Gluc Sensor (FREESTYLE LIBRE SENSOR SYSTEM) MISC Check sugars 3 x a day DX E10.9 1 each 0   glucose blood (ACCU-CHEK AVIVA PLUS) test strip Use daily to check BS TID Dx. Z61.09 300 each  1   insulin NPH-regular Human (NOVOLIN 70/30) (70-30) 100 UNIT/ML injection INJECT 25 UNITS IN THE AM [place] WITH FOOD, AND INJECT 25 UNITS [place] WITH EVENING MEAL. 50 mL 2   Insulin Syringe-Needle U-100 31G X 15/64" 0.5 ML MISC Use two pens  daily with insulin 100 each 3   Magnesium 500 MG TABS Take 500 mg by mouth daily.     olmesartan (BENICAR) 40 MG tablet TAKE 1 TABLET DAILY FOR BLOOD PRESSURE 90 tablet 1   Omega-3 Fatty Acids (FISH OIL) 1200 MG CAPS Take 1,200 capsules by mouth daily.      omeprazole (PRILOSEC) 40 MG capsule TAKE 1 CAPSULE BY MOUTH EVERY DAY 90 capsule 2   OVER THE COUNTER MEDICATION Take 1 tablet by mouth daily. One a day vitamin Woman's one a day     rosuvastatin (CRESTOR) 40 MG tablet TAKE 1/2 TO 1 TABLET DAILY OR AS DIRECTED FOR CHOLESTEROL 90 tablet 1   Semaglutide,0.25 or 0.'5MG'$ /DOS, (OZEMPIC, 0.25 OR 0.5 MG/DOSE,) 2 MG/1.5ML SOPN Inject 0.5 mg into the skin once a week. 2 pen 5   vitamin E 400 UNIT capsule Take 400 Units by mouth daily.     No current facility-administered medications for this visit.     Pertinent items are noted in HPI.   Review of Systems:     Cardiac Review of Systems: [Y] = yes  or   [ N ] = no   Chest Pain [  n  ]  Resting SOB [ n  ] Exertional SOB  [ y ]  Orthopnea [  n]   Pedal Edema [  n ]    Palpitations [ n ] Syncope  [n  ]   Presyncope [ n  ]   General Review of Systems: [Y] = yes [  ]=no Constitional: recent weight change [  ];  Wt loss over the last 3 months [   ] anorexia [  ]; fatigue [  ]; nausea [  ]; night sweats [  ]; fever [  ]; or chills [  ];           Eye : blurred vision [  ]; diplopia [   ]; vision changes [  ];  Amaurosis fugax[  ]; Resp: cough [  ];  wheezing[  ];  hemoptysis[  ]; shortness of breath[  ]; paroxysmal nocturnal dyspnea[  ]; dyspnea on exertion[  ]; or orthopnea[  ];  GI:  gallstones[  ], vomiting[  ];  dysphagia[  ]; melena[  ];  hematochezia [  ]; heartburn[  ];   Hx of  Colonoscopy[  ]; GU: kidney stones [  ]; hematuria[  ];   dysuria [  ];  nocturia[  ];  history of     obstruction [  ]; urinary frequency [  ]             Skin: rash, swelling[  ];, hair loss[  ];  peripheral edema[  ];  or itching[  ]; Musculosketetal: myalgias[   ];  joint swelling[  ];  joint erythema[  ];  joint pain[  ];  back pain[  ];  Heme/Lymph: bruising[  ];  bleeding[  ];  anemia[  ];  Neuro: TIA[  ];  headaches[  ];  stroke[y  ];  vertigo[  ];  seizures[  ];   paresthesias[  ];  difficulty walking[  ];  Psych:depression[  ]; anxiety[  ];  Endocrine: diabetes[  ];  thyroid dysfunction[  ];  Immunizations: Flu up to date [  ]; Pneumococcal up to date [  ];  Other:     PHYSICAL EXAMINATION: BP (!) 155/62 (BP Location: Right Arm, Patient Position: Sitting, Cuff Size: Normal)    Pulse 63    Temp (!) 97.5 F (36.4 C)    Resp 18    Ht 5' 6"  (1.676 m)    Wt 187 lb (84.8 kg)    SpO2 90% Comment: RA   BMI 30.18 kg/m  General appearance: alert and cooperative Head: Normocephalic, without obvious abnormality, atraumatic Neck: no adenopathy, no carotid bruit, no JVD, supple, symmetrical, trachea midline and thyroid not enlarged, symmetric, no tenderness/mass/nodules Lymph nodes: Cervical, supraclavicular, and axillary nodes normal. Resp: clear to auscultation bilaterally Back: symmetric, no curvature. ROM normal. No CVA tenderness. Cardio: regular rate and rhythm, S1, S2 normal, no murmur, click, rub or gallop GI: soft, non-tender; bowel sounds normal; no masses,  no organomegaly Extremities: extremities normal, atraumatic, no cyanosis or edema and Homans sign is negative, no sign of DVT Neurologic: Grossly normal some slight weakness in numbness in the right hand and arm.  Diagnostic Studies & Laboratory data:     Recent Radiology Findings:   Ct Super D Chest Wo Contrast  Result Date: 04/19/2019 CLINICAL DATA:  Follow-up lung nodule.  Current smoker. EXAM: CT CHEST WITHOUT CONTRAST TECHNIQUE: Multidetector CT imaging of the chest was performed using thin slice collimation for electromagnetic bronchoscopy planning purposes, without intravenous contrast. COMPARISON:  No CT 11/27/2018 FINDINGS: Cardiovascular: Coronary artery calcification and  aortic atherosclerotic calcification. Mediastinum/Nodes: No axillary or supraclavicular adenopathy. No mediastinal hilar adenopathy. No pericardial effusion. Esophagus normal. Lungs/Pleura: No dominant ground-glass nodule medial aspect of the RIGHT upper lobe measures 1.6 by 1.2 cm (image 23/8) compared with 1.7 by 1.0 cm on prior remeasured. Visually lesion looks very similar. Smaller sub solid nodule in the subpleural RIGHT upper lobe measuring 5 mm (image 65/8) is unchanged. Stable calcified granuloma in the RIGHT lower lobe. LEFT lower lobe rounded ill-defined nodule (image 58/8) measuring 5 mm unchanged. No new nodularity Upper Abdomen: Limited view of the liver, kidneys, pancreas are unremarkable. Normal adrenal glands. Granulomata within the spleen. Musculoskeletal: Degenerate sclerosis of the spine. No acute findings IMPRESSION: 1. Dominant ground-glass nodule in the medial aspect of the RIGHT upper lobe is not significant changed in size measurements or visual assessment compared to CT 01/27/2019. 2. Additional smaller solid and sub solid nodules in both lungs are unchanged. 3. No new pulmonary nodules. 4. Mild centrilobular emphysema in the upper lobes. Evidence of granulomatous disease. 5. Coronary artery calcification and Aortic Atherosclerosis (ICD10-I70.0). Electronically Signed   By: Suzy Bouchard M.D.   On: 04/19/2019 11:19     I have independently reviewed the above radiology studies  and reviewed the findings with the patient.   03/2019     04/2015   ADDENDUM REPORT: 12/14/2018 16:52  ADDENDUM: Cardiothoracic surgeon Dr. Servando Snare has requested a second opinion on this case.  ADDENDED FINDINGS:  Cardiovascular: Normal heart size. No significant pericardial effusion/thickening. Three-vessel coronary atherosclerosis. Atherosclerotic nonaneurysmal thoracic aorta. Top-normal caliber main pulmonary artery (3 1 cm diameter) stable.  Mediastinum/Nodes: No discrete thyroid  nodules. Unremarkable esophagus. No axillary adenopathy. Stable coarsely calcified nonenlarged right paratracheal and right hilar nodes from prior granulomatous disease. No pathologically enlarged mediastinal or discrete hilar nodes on this noncontrast scan.  Lungs/Pleura: No pneumothorax. No pleural effusion. Moderate centrilobular emphysema with diffuse bronchial wall thickening. Medial apical right  upper lobe 2.0 x 1.3 cm ground-glass pulmonary nodule (series 3/image 29), previously 1.8 x 1.3 cm on 10/10/2017 chest CT and 1.7 x 1.2 cm on 10/25/2014 chest CT using similar measurement technique, minimally increased in size. Ground-glass 0.5 cm anterior right upper lobe pulmonary nodule (series 3/image 64) is stable since 10/25/2014 chest CT. Stable coarsely calcified 9 mm peripheral right lower lobe granuloma. Ground-glass 5 mm left lower lobe pulmonary nodule (series 3/image 68) and subpleural peripheral basilar 4 mm left lower lobe solid pulmonary nodule (series 3/image 104) are stable since 10/25/2014 chest CT. No acute consolidative airspace disease, lung masses or new significant pulmonary nodules. Stable moderate varicoid bronchiectasis in the posterior right middle lobe.  Upper abdomen: Stable granulomatous splenic calcifications. Stable appearance of the mildly thickened adrenal glands without discrete adrenal nodules.  Musculoskeletal: No aggressive appearing focal osseous lesions. Mild thoracic spondylosis.  ADDENDED IMPRESSION:  1. Dominant ground-glass pulmonary nodule in the medial apical right upper lobe measures 2.0 x 1.3 cm and has minimally increased in size on consecutive chest CT studies back to 10/25/2014. Continued chest CT surveillance warranted at a minimum. Please note that this addendum corrects a laterality error in the original report. 2. Additional subcentimeter ground-glass and solid pulmonary nodules are unchanged back to 2016 chest CT. 3. No  thoracic adenopathy. 4. Three-vessel coronary atherosclerosis.  Aortic Atherosclerosis (ICD10-I70.0) and Emphysema (ICD10-J43.9).  These addended results will be called to the ordering clinician or representative by the Radiologist Assistant, and communication documented in the PACS or zVision Dashboard.   Electronically Signed By: Ilona Sorrel M.D. On: 12/14/2018 16:52     Recent Lab Findings: Lab Results  Component Value Date   WBC 10.3 11/13/2018   HGB 16.5 (H) 11/13/2018   HCT 48.3 (H) 11/13/2018   PLT 243 11/13/2018   GLUCOSE 342 (H) 11/13/2018   CHOL 134 11/13/2018   TRIG 131 11/13/2018   HDL 40 (L) 11/13/2018   LDLDIRECT 146.4 01/30/2010   LDLCALC 73 11/13/2018   ALT 21 11/13/2018   AST 29 11/13/2018   NA 143 11/13/2018   K 4.8 11/13/2018   CL 102 11/13/2018   CREATININE 1.59 (H) 11/13/2018   BUN 16 11/13/2018   CO2 32 11/13/2018   TSH 1.65 11/13/2018   INR 0.87 01/25/2017   HGBA1C 10.9 (H) 11/13/2018    PFT's Severe Obstructive Airways Disease Insignificant response to bronchodilator Restrictive process suggested by lack of over-inflation Minimal Diffusion Defect FEV1 1.2   50% predicted  Assessment / Plan:   #1 groundglass opacity right upper lobe medially appears basically unchanged since 2016-other small nodules are unchanged   With the patient's previous history of multiple strokes we will continue to follow this and plan biopsy and treatment if it enlarges and/or becomes more solid in appearance, follow-up CT scan in 6 months  #2 history of stroke x3, has known stable bilateral cerebral aneurysms #3 underlying severe obstructive airway disease with FEV1 1.2  50% predicted.    Grace Isaac MD      [address] [place] [address] Office [phone]   Beeper [phone]  04/19/2019 11:23 AM

## 2019-04-26 ENCOUNTER — Other Ambulatory Visit: Payer: Self-pay | Admitting: *Deleted

## 2019-04-26 NOTE — Progress Notes (Signed)
The proposed treatment discussed in cancer conference on 04/26/2019 is for discussion purpose only and is not a binding recommendation.  The patient was not physically examined nor present for their treatment options.  Therefore, final treatment plans cannot be decided.

## 2019-05-14 ENCOUNTER — Encounter: Payer: Self-pay | Admitting: Internal Medicine

## 2019-05-14 DIAGNOSIS — H40023 Open angle with borderline findings, high risk, bilateral: Secondary | ICD-10-CM | POA: Diagnosis not present

## 2019-05-14 DIAGNOSIS — Z961 Presence of intraocular lens: Secondary | ICD-10-CM | POA: Diagnosis not present

## 2019-05-14 DIAGNOSIS — E113293 Type 2 diabetes mellitus with mild nonproliferative diabetic retinopathy without macular edema, bilateral: Secondary | ICD-10-CM | POA: Diagnosis not present

## 2019-05-14 DIAGNOSIS — H04123 Dry eye syndrome of bilateral lacrimal glands: Secondary | ICD-10-CM | POA: Diagnosis not present

## 2019-05-14 LAB — HM DIABETES EYE EXAM

## 2019-05-17 ENCOUNTER — Encounter: Payer: Self-pay | Admitting: Internal Medicine

## 2019-05-18 ENCOUNTER — Other Ambulatory Visit: Payer: Self-pay | Admitting: Physician Assistant

## 2019-05-18 DIAGNOSIS — F411 Generalized anxiety disorder: Secondary | ICD-10-CM

## 2019-05-19 ENCOUNTER — Other Ambulatory Visit: Payer: Self-pay | Admitting: Internal Medicine

## 2019-05-27 ENCOUNTER — Other Ambulatory Visit: Payer: Self-pay | Admitting: Internal Medicine

## 2019-05-27 DIAGNOSIS — I10 Essential (primary) hypertension: Secondary | ICD-10-CM

## 2019-06-15 ENCOUNTER — Other Ambulatory Visit: Payer: Self-pay | Admitting: Physician Assistant

## 2019-06-15 DIAGNOSIS — F411 Generalized anxiety disorder: Secondary | ICD-10-CM

## 2019-06-21 ENCOUNTER — Other Ambulatory Visit: Payer: Self-pay | Admitting: Internal Medicine

## 2019-06-27 ENCOUNTER — Telehealth: Payer: Self-pay | Admitting: Cardiology

## 2019-06-27 NOTE — Telephone Encounter (Signed)
LVM for patient to call and schedule 4 month followup.

## 2019-06-28 ENCOUNTER — Encounter: Payer: Self-pay | Admitting: Internal Medicine

## 2019-06-28 DIAGNOSIS — Z20828 Contact with and (suspected) exposure to other viral communicable diseases: Secondary | ICD-10-CM | POA: Diagnosis not present

## 2019-07-03 ENCOUNTER — Other Ambulatory Visit: Payer: Self-pay

## 2019-07-03 ENCOUNTER — Ambulatory Visit: Payer: Medicare Other | Admitting: Physician Assistant

## 2019-07-03 ENCOUNTER — Other Ambulatory Visit: Payer: Medicare Other

## 2019-07-03 ENCOUNTER — Encounter: Payer: Self-pay | Admitting: Physician Assistant

## 2019-07-03 DIAGNOSIS — R8290 Unspecified abnormal findings in urine: Secondary | ICD-10-CM

## 2019-07-03 DIAGNOSIS — I779 Disorder of arteries and arterioles, unspecified: Secondary | ICD-10-CM | POA: Diagnosis not present

## 2019-07-03 MED ORDER — SULFAMETHOXAZOLE-TRIMETHOPRIM 800-160 MG PO TABS: 1.0000 | ORAL_TABLET | Freq: Two times a day (BID) | ORAL | 0 refills | Status: AC

## 2019-07-03 MED ORDER — FLUCONAZOLE 150 MG PO TABS: 150.0000 mg | ORAL_TABLET | Freq: Every day | ORAL | 3 refills | Status: DC

## 2019-07-03 NOTE — Progress Notes (Signed)
HPI: She states last 2 weeks she has had pressure, frequency.  She states similar to prior UTI's.  Progressively worse associated with dysuria and small volume voiding with increased frequency denies hematuria, flank pain or fever Has some back pain but states that this is more from her OA and more with movement.   No vaginal discharge or itching.   She could not tolerate the ozempic, her sugars have been 160-200 in the AM. She is on 30 units of insulin and has follow up on the 19th.   PMH: reviewed  ROS:  Gen.: No unexpected weight change, no night sweats Lungs: No cough or shortness of breath Cardiovascular: No palpitations or chest pain  PE: There were no vitals taken for this visit. General Appearance:Well sounding, in no apparent distress.  ENT/Mouth: No hoarseness, No cough for duration of visit.  Respiratory: completing full sentences without distress, without audible wheeze Neuro: Awake and oriented X 3,  Psych:  Insight and Judgment appropriate.    Lab Results  Component Value Date   WBC 10.3 11/13/2018   HGB 16.5 (H) 11/13/2018   HCT 48.3 (H) 11/13/2018   PLT 243 11/13/2018   GLUCOSE 342 (H) 11/13/2018   CHOL 134 11/13/2018   TRIG 131 11/13/2018   HDL 40 (L) 11/13/2018   LDLDIRECT 146.4 01/30/2010   LDLCALC 73 11/13/2018   ALT 21 11/13/2018   AST 29 11/13/2018   NA 143 11/13/2018   K 4.8 11/13/2018   CL 102 11/13/2018   CREATININE 1.59 (H) 11/13/2018   BUN 16 11/13/2018   CO2 32 11/13/2018   TSH 1.65 11/13/2018   INR 0.87 01/25/2017   HGBA1C 10.9 (H) 11/13/2018   MICROALBUR 387.5 04/18/2018    Assessment/Plan: Empiric antibiotic x7 days Urine culture for identification and sensitivities Hydration recommended education provided Get sugars better controlled- follow up with Dr. Darnell Level on the 19th   Future Appointments  Date Time Provider [place]  07/03/2019  9:30 AM [name], PA-C [facility] None  07/09/2019 10:15 AM Philemon Kingdom,  MD [facility] None  08/06/2019  9:00 AM Minus Breeding, MD [facility]  11/28/2019 10:00 AM [name], PA-C [facility] None

## 2019-07-05 LAB — URINALYSIS, ROUTINE W REFLEX MICROSCOPIC
Bacteria, UA: NONE SEEN /HPF
Bilirubin Urine: NEGATIVE
Hgb urine dipstick: NEGATIVE
Hyaline Cast: NONE SEEN /LPF
Ketones, ur: NEGATIVE
Leukocytes,Ua: NEGATIVE
Nitrite: NEGATIVE
RBC / HPF: NONE SEEN /HPF (ref 0–2)
Specific Gravity, Urine: 1.022 (ref 1.001–1.03)
Squamous Epithelial / LPF: NONE SEEN /HPF (ref <=5)
WBC, UA: NONE SEEN /HPF (ref 0–5)
pH: <=5 (ref 5.0–8.0)

## 2019-07-05 LAB — URINE CULTURE
MICRO NUMBER:: 985574
SPECIMEN QUALITY:: ADEQUATE

## 2019-07-09 ENCOUNTER — Ambulatory Visit (INDEPENDENT_AMBULATORY_CARE_PROVIDER_SITE_OTHER): Payer: Medicare Other | Admitting: Internal Medicine

## 2019-07-09 ENCOUNTER — Other Ambulatory Visit: Payer: Self-pay

## 2019-07-09 ENCOUNTER — Encounter: Payer: Self-pay | Admitting: Internal Medicine

## 2019-07-09 VITALS — BP 150/58 | HR 47 | Ht 66.0 in | Wt 188.0 lb

## 2019-07-09 DIAGNOSIS — Z794 Long term (current) use of insulin: Secondary | ICD-10-CM | POA: Diagnosis not present

## 2019-07-09 DIAGNOSIS — E669 Obesity, unspecified: Secondary | ICD-10-CM

## 2019-07-09 DIAGNOSIS — E1151 Type 2 diabetes mellitus with diabetic peripheral angiopathy without gangrene: Secondary | ICD-10-CM | POA: Diagnosis not present

## 2019-07-09 DIAGNOSIS — E782 Mixed hyperlipidemia: Secondary | ICD-10-CM

## 2019-07-09 DIAGNOSIS — E66811 Obesity, class 1: Secondary | ICD-10-CM

## 2019-07-09 DIAGNOSIS — Z23 Encounter for immunization: Secondary | ICD-10-CM | POA: Diagnosis not present

## 2019-07-09 DIAGNOSIS — I779 Disorder of arteries and arterioles, unspecified: Secondary | ICD-10-CM

## 2019-07-09 LAB — POCT GLYCOSYLATED HEMOGLOBIN (HGB A1C): Hemoglobin A1C: 9.1 % — AB (ref 4.0–5.6)

## 2019-07-09 MED ORDER — FARXIGA 5 MG PO TABS: 5.0000 mg | ORAL_TABLET | Freq: Every day | ORAL | 5 refills | Status: DC

## 2019-07-09 MED ORDER — NOVOLIN 70/30 (70-30) 100 UNIT/ML ~~LOC~~ SUSP: SUBCUTANEOUS | 2 refills | Status: DC

## 2019-07-09 NOTE — Patient Instructions (Addendum)
  Please increase: - Novolog 70/30 35 units in am and 30 units before dinner  Please add: - Farxiga 5 mg daily before b'fast  Check some sugars at bedtime.  Please look up the VGo pump.  Please schedule an appt with Antonieta Iba with nutrition.  Please return in 3 months with your sugar log.

## 2019-07-09 NOTE — Progress Notes (Signed)
Patient ID: [name], female   DOB: [birth year], 73 y.o.   MRN: [id number]   HPI: [name] is a 73 y.o.-year-old female, initially referred by her cardiologist, Dr.Hochrein, presenting for follow-up for DM2, dx in ~2000, insulin-dependent since ~2005, uncontrolled, with complications (PVD - s/p stents and fem-pop bypass; h/o CVA x3; CKD stage 3b; DR; PN). She saw my colleague, Dr. Loanne Drilling many years ago.  Last visit with me 5 months ago (virtual). PCP: [name], PA  Reviewed her HbA1c levels: Lab Results  Component Value Date   HGBA1C 10.9 (H) 11/13/2018   HGBA1C 9.7 (H) 07/19/2018   HGBA1C 8.7 (H) 04/18/2018   HGBA1C 8.3 (H) 01/03/2018   HGBA1C 10.0 (H) 09/27/2017   HGBA1C 10.5 (H) 05/04/2017   HGBA1C 8.9 02/25/2017   HGBA1C 10.9 (H) 11/10/2016   HGBA1C 9.3 (H) 07/22/2016   HGBA1C 9.5 (H) 02/02/2016   Component     Latest Ref Rng & Units 07/22/2016  Glutamic Acid Decarb Ab     <5 IU/mL <5   Current regimen: - Novolog 70/30 25 >> 30 units 2x a day -injecting in the hips and arms as her stomach hurts. -started 11/2018 at 0.25 mg weekly, then increased, but decreased back to 0.25 mg weekly in 03/2019 due to GI SEs (nausea, vomiting, abdominal pain)>> now off since 04/2019 - Cinnamon 1000 mg 2x a day She was on Invokana >> intolerance 2/2 weight loss and dehydration >> hospitalization. She was on Metformin and sulfonylurea at diagnosis. She was also on Januvia at the same time with Invokana >> stopped along with Invokana   Pt checks her sugars 2x a day: - am: 168-296 >> 149-172 >> 119-150 - 2h after b'fast: 326 >> n/c - before lunch: 265 >> 124-172 >> n/c - 2h after lunch: 280, 286 >> n/c - before dinner: 176, 244 >> 119-156 >> 166-180s - 2h after dinner: 231 >> 149-183, 203 >> 247 - bedtime: n/c - nighttime: n/c Lowest sugar was 150 >> 119 >> 119; she has hypoglycemia awareness at 100. Highest sugar was 300s >> 203 >> 247 (cake).  Glucometer: [place]  Pt's  meals are: - Breakfast: eggs or cheese sandwich - Lunch: half a sandwich, apple - Dinner 9-9:30 pm: chicken, veggies - Snacks: stopped cheese; now apples and pickles She works part-time, from 10 am - 2 pm Tue-Fri. Now works from home - feels this works better for her.  -+ CKD stage IIIb, last BUN/creatinine:  Lab Results  Component Value Date   BUN 16 11/13/2018   BUN 17 08/28/2018   CREATININE 1.59 (H) 11/13/2018   CREATININE 1.44 (H) 08/28/2018  On olmesartan 40.  -+ HL; last set of lipids: Lab Results  Component Value Date   CHOL 134 11/13/2018   HDL 40 (L) 11/13/2018   LDLCALC 73 11/13/2018   LDLDIRECT 146.4 01/30/2010   TRIG 131 11/13/2018   CHOLHDL 3.4 11/13/2018  On Crestor 40, omega-3 fatty acids 1200 mg daily.  - last eye exam was in 10/2017: + DR, + glaucoma.  -+ Numbness and tingling in her feet.  She has a callus on the left foot.  She sees a podiatrist.  She has been on Lyrica but had intolerance.  On ASA 81.  Pt has FH of DM in sister.  She has a history of nontoxic multinodular goiter.  Latest TSH is normal: Lab Results  Component Value Date   TSH 1.65 11/13/2018   Also, she has a history of  HTN, brain aneurysm, emphysema, esophageal stricture, GERD. She had a surgery for hernia - had mesh placed >> still has AP.  ROS: Constitutional: + weight gain/no weight loss, no fatigue, no subjective hyperthermia, no subjective hypothermia Eyes: no blurry vision, no xerophthalmia ENT: no sore throat, no nodules palpated in neck, no dysphagia, no odynophagia, no hoarseness Cardiovascular: no CP/no SOB/no palpitations/no leg swelling Respiratory: no cough/no SOB/no wheezing Gastrointestinal: no N/no V/no D/no C/no acid reflux Musculoskeletal: no muscle aches/no joint aches Skin: no rashes, no hair loss Neurological: no tremors/no numbness/no tingling/no dizziness  I reviewed pt's medications, allergies, PMH, social hx, family hx, and changes were documented in  the history of present illness. Otherwise, unchanged from my initial visit note.  Past Medical History:  Diagnosis Date  . Abnormal findings on esophagogastroduodenoscopy (EGD) 07/2010  . Aneurysm [facility]) 2013   Right Brain   . Aneurysm ([place])    in brain x 2  . Anxiety   . Colon polyps   . Diabetes mellitus 1998  . Diverticulosis   . ESOPHAGEAL STRICTURE 08/27/2008  . GERD (gastroesophageal reflux disease)   . Hemorrhoids   . Hiatal hernia   . Hyperlipidemia   . Hypertension 2000  . Migraines   . Peripheral vascular disease ([place])   . Phlebitis    30 years ago  left leg  . Stroke [facility])    multiple mini strokes ( brain aneurysm )   Past Surgical History:  Procedure Laterality Date  . ABDOMINAL AORTAGRAM N/A 01/04/2012   Procedure: ABDOMINAL Maxcine Ham;  Surgeon: Serafina Mitchell, MD;  Location: [facility] CATH LAB;  Service: Cardiovascular;  Laterality: N/A;  . ABDOMINAL AORTAGRAM N/A 08/15/2012   Procedure: ABDOMINAL Maxcine Ham;  Surgeon: Serafina Mitchell, MD;  Location: [facility] CATH LAB;  Service: Cardiovascular;  Laterality: N/A;  . ABDOMINAL HYSTERECTOMY  1984  . cataract surgery Right 10-22-2015  . cataract surgery Left 11-05-2015  . CESAREAN SECTION    . CHOLECYSTECTOMY    . COLONOSCOPY  07/2010  . DENTAL SURGERY  Aug. 16, 2013   left lower   . ESOPHAGOGASTRODUODENOSCOPY  07/2010  . EYE SURGERY  Nov. 2014   Laser-Glaucoma  . FEMORAL ARTERY STENT  05/11/11   Left superficial femoral and popliteal artery  . HERNIA REPAIR     times two  . KNEE SURGERY    . LOWER EXTREMITY ANGIOGRAM Left 08/15/2012   Procedure: LOWER EXTREMITY ANGIOGRAM;  Surgeon: Serafina Mitchell, MD;  Location: [facility] CATH LAB;  Service: Cardiovascular;  Laterality: Left;  lt leg angio  . rotator cuff surgery    . THYROID SURGERY     Social History   Socioeconomic History  . Marital status: Married    Spouse name: Not on file  . Number of children: 2  . Years of education: Not on file  . Highest education level:  Not on file  Occupational History  . 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 Financial resource strain: Not on file  . Food insecurity    Worry: Not on file    Inability: Not on file  . Transportation needs    Medical: Not on file    Non-medical: Not on file  Tobacco Use  . Smoking status: Current Every Day Smoker    Packs/day: 0.25    Years: 40.00    Pack years: 10.00    Types: Cigarettes  . Smokeless tobacco: Never Used  Substance and Sexual Activity  . Alcohol use:  No    Alcohol/week: 0.0 standard drinks  . Drug use: No  . Sexual activity: Not on file  Lifestyle  . Physical activity    Days per week: Not on file    Minutes per session: Not on file  . Stress: Not on file  Relationships  . Social Herbalist on phone: Not on file    Gets together: Not on file    Attends religious service: Not on file    Active member of club or organization: Not on file    Attends meetings of clubs or organizations: Not on file    Relationship status: Not on file  . Intimate partner violence    Fear of current or ex partner: Not on file    Emotionally abused: Not on file    Physically abused: Not on file    Forced sexual activity: Not on file  Other Topics Concern  . Not on file  Social History Narrative   Lives with husband.        Current Outpatient Medications on File Prior to Visit  Medication Sig Dispense Refill  . amLODipine (NORVASC) 10 MG tablet Take 1 tablet Daily for BP 90 tablet 3  . aspirin EC 81 MG tablet Take 81 mg by mouth at bedtime.     . bisoprolol-hydrochlorothiazide (ZIAC) 10-6.25 MG tablet Take 1 tablet by mouth daily. 90 tablet 3  . Blood Glucose Monitoring Suppl (ACCU-CHEK AVIVA PLUS) W/DEVICE KIT Check blood sugar up to three times daily 1 kit 0  . Cholecalciferol (VITAMIN D PO) Take 5,000 Units by mouth daily.    Marland Kitchen CINNAMON PO Take 1,000 mg by mouth 2 (two) times daily.     . citalopram (CELEXA) 40 MG tablet Take 1  tablet Daily for Mood & Anxiety 90 tablet 1  . clonazePAM (KLONOPIN) 0.5 MG tablet Take 1/2 to 1 tablet at Bedtime ONLY if needed for Sleep  & please try to limit to 5 days /week to avoid addiction or Dementia with regular use 30 tablet 0  . Continuous Blood Gluc Sensor (FREESTYLE LIBRE SENSOR SYSTEM) MISC Check sugars 3 x a day DX E10.9 1 each 0  . fluconazole (DIFLUCAN) 150 MG tablet Take 1 tablet (150 mg total) by mouth daily. 1 tablet 3  . glucose blood (ACCU-CHEK AVIVA PLUS) test strip Use daily to check BS TID Dx. E11.22 300 each 1  . insulin NPH-regular Human (NOVOLIN 70/30) (70-30) 100 UNIT/ML injection INJECT 25 UNITS IN THE AM [place] WITH FOOD, AND INJECT 25 UNITS [place] WITH EVENING MEAL. 50 mL 2  . Insulin Syringe-Needle U-100 31G X 15/64" 0.5 ML MISC Use two pens daily with insulin 100 each 3  . Magnesium 500 MG TABS Take 500 mg by mouth daily.    Marland Kitchen olmesartan (BENICAR) 40 MG tablet Take 1 tablet Daily for BP 90 tablet 3  . Omega-3 Fatty Acids (FISH OIL) 1200 MG CAPS Take 1,200 capsules by mouth daily.     Marland Kitchen omeprazole (PRILOSEC) 40 MG capsule TAKE 1 CAPSULE BY MOUTH EVERY DAY 90 capsule 2  . OVER THE COUNTER MEDICATION Take 1 tablet by mouth daily. One a day vitamin Woman's one a day    . rosuvastatin (CRESTOR) 40 MG tablet Take 1 /2 to 1 tablet Daily for Cholesterol 90 tablet 1  . Semaglutide,0.25 or 0.5MG/DOS, (OZEMPIC, 0.25 OR 0.5 MG/DOSE,) 2 MG/1.5ML SOPN Inject 0.5 mg into the skin once a week. 2 pen 5  .  sulfamethoxazole-trimethoprim (BACTRIM DS) 800-160 MG tablet Take 1 tablet by mouth 2 (two) times daily for 7 days. 14 tablet 0  . vitamin E 400 UNIT capsule Take 400 Units by mouth daily.     No current facility-administered medications on file prior to visit.    Allergies  Allergen Reactions  . Ciprofloxacin Other (See Comments)  . Plavix [Clopidogrel Bisulfate] Palpitations  . Amoxicillin Itching and Swelling    FACE & EYES SWELL  . Ace Inhibitors   . Ciprofloxacin Hcl  Swelling  . Lyrica [Pregabalin]     Itch, gain weight  . Penicillins Other (See Comments)    REACTION: unspecified   Family History  Problem Relation Age of Onset  . CAD Mother 9       Died of MI  . Hypertension Mother   . Heart attack Mother   . Heart disease Mother   . CAD Father 23       Died of MI  . Heart disease Father   . Heart attack Father   . CAD Brother 55       Two brothers died of MI  . Heart attack Brother   . Heart disease Brother        Amputation  . Diabetes Sister   . Hypertension Sister   . Heart attack Brother   . Heart disease Brother   . Colon cancer Neg Hx   . Stomach cancer Neg Hx   . Esophageal cancer Neg Hx     PE: BP (!) 150/58   Pulse (!) 47 Comment: checked twice  Ht '5\' 6"'$  (1.676 m)   Wt 188 lb (85.3 kg)   SpO2 94%   BMI 30.34 kg/m  Wt Readings from Last 3 Encounters:  07/09/19 188 lb (85.3 kg)  04/19/19 187 lb (84.8 kg)  02/26/19 181 lb (82.1 kg)   Constitutional: overweight, in NAD Eyes: PERRLA, EOMI, no exophthalmos ENT: moist mucous membranes, no thyromegaly, no cervical lymphadenopathy Cardiovascular: RRR, No MRG Respiratory: CTA B Gastrointestinal: abdomen soft, NT, ND, BS+ Musculoskeletal: no deformities, strength intact in all 4 Skin: moist, warm, no rashes Neurological: no tremor with outstretched hands, DTR normal in all 4  ASSESSMENT: 1. DM2, insulin-dependent, uncontrolled, with complications - PVD - s/p stents and fem-pop bypass - h/o CVA - CKD stage 3b - DR - PN  2.  Hyperlipidemia  3.  Obesity class 1  PLAN:  1. Patient with longstanding, uncontrolled, type 2 diabetes, on premixed insulin regimen, to which we added a weekly GLP-1 receptor agonist in 11/2018.  At that time, HbA1c was very high, at 10.9%.  We also discussed about the concept of insulin resistance and I made specific suggestions about improving her diet, as this was mostly high fat and high salt.  We will need to check her insulin production  in the near future. -She does have a history of heart disease and CKD so an SGLT2 inhibitor would have been ideal, however, she was having urinary symptoms (UTI) at last visit to the clinic so I did not suggest this.  Also, Invokana would not be a good option for her due to severe PVD.  Vania Rea and Wilder Glade may be options for her in the future. -At last visit, sugars were much better, significantly improved despite only being on the higher dose of Ozempic for 4 weeks.  However, she was having some symptoms from the blood sugars: Blurry vision, dizziness.  I explained that these are to be expected due  to the improvement in her blood sugars.  We did not intensify her regimen at that time because of the symptoms.  Since then, she contacted me in 03/2019 with GI symptoms from [place] so we backed off of the dose to 0.25 mg weekly, but she could not even continue this dose due to side effects. - at this visit, sugars are still higher than goal, especially in the second half of the day, but she is not checking consistently and also not checking sugars at bedtime.  I strongly advised her to start.  We discussed about options for treatment: For now, I suggested to increase NovoLog dose in the morning and also add a low-dose SGLT2 inhibitor.  At this visit, we discussed about this class of drugs and I explained that we have to be careful since she had side effects to Invokana (significant dehydration in the past) and she has a history of UTIs.  After discussing the benefits and potential side effects, she agrees to trial low-dose [place]. -However, at this visit, I also suggested a VGo patch pump -explained how it works and also given brochure.  If she decides for it, she needs to check with her insurance whether this is covered.  I advised her to let me know. - I suggested to:  Patient Instructions  Please increase: - Novolog 70/30 35 units in am and 30 units before dinner  Please add: - Farxiga 5 mg daily  before b'fast  Check some sugars at bedtime.  Please look up the VGo pump.  Please schedule an appt with Antonieta Iba with nutrition.  Please return in 3 months with your sugar log.   - we checked her HbA1c: 9.1% (slightly lower) - advised to check sugars at different times of the day - 2x a day, rotating check times - advised for yearly eye exams >> she is not UTD - return to clinic in 3-4 months   2. HL -Reviewed latest lipid panel from 10/2018: LDL at goal, HDL slightly low, triglycerides at goal: Lab Results  Component Value Date   CHOL 134 11/13/2018   HDL 40 (L) 11/13/2018   LDLCALC 73 11/13/2018   LDLDIRECT 146.4 01/30/2010   TRIG 131 11/13/2018   CHOLHDL 3.4 11/13/2018  -Continues rosuvastatin without side effects.  Also on omega-3 fatty acids.  3.  Obesity -She initially lost 7 pounds after starting Ozempic, but unfortunately she could not tolerate Ozempic and had to stop. -She gained approximately 7 pounds in the last 2 months. -Discussed that Wilder Glade should also help with weight loss.  Philemon Kingdom, MD PhD [facility]

## 2019-07-10 ENCOUNTER — Encounter: Payer: Self-pay | Admitting: *Deleted

## 2019-07-10 DIAGNOSIS — Z006 Encounter for examination for normal comparison and control in clinical research program: Secondary | ICD-10-CM

## 2019-07-10 NOTE — Research (Signed)
COORDINATE-Diabetes 6 Month CASE REPORT FORM (Intervention) -EHR Site #:   [id number]              Patient ID:           [id number]     0 MONTH EHR REVIEW  Medical Record Check Date 07/10/19   Vital Status [x] Patient Alive >Date last known alive per EHR:   [] Patient Dead >> Complete Death Form  [] Unknown   CLINICAL EVENTS / PROCEDURES  Hospitalization since last visit? (>=24 hour stay) [x] No [] Yes  >> if yes, Complete the following  Date of hospital admission:        / /             MM DD YYYY  Primary discharge diagnosis:  *Complete appropriate event validation form [] acute myocardial infarction (heart attack)* [] stroke* [] heart failure* [] coronary revascularization* [] peripheral revascularization* [] cerebral revascularization* [] diabetes (e.g. hypoglycemia, DKA) [] renal failure [] amputation [] other cardiovascular reason [] other NON-cardiovascular reason [] unknown  Other diagnoses not documented above: (check all that apply)  *Complete appropriate event validation form [] acute myocardial infarction (heart attack)* [] stroke* [] heart failure* [] coronary revascularization* [] peripheral revascularization* [] cerebral revascularization* [] diabetes (e.g. hypoglycemia, DKA) [] renal failure [] amputation [] other cardiovascular reason [] other NON-cardiovascular reason [] unknown  Were any of the following outpatient procedures done since the last visit? (I.e. procedures not captured above)  Coronary revascularization [x] No [] Yes >IF YES, Date / /             MM DD YYYY  Peripheral revascularization [x] No [] Yes > IF YES, Date / /             MM DD YYYY  Cerebral revascularization [x] No [] Yes > IF YES, Date / /             MM DD YYYY  Extremity amputation    [x] No [] Yes >IF YES, Date / /             MM      DD       YYYY  Renal replacement therapy (i.e. dialysis)    [x] No [] Yes > IF YES, Date of initiation / /             MM      DD       YYYY   **EDC will allow for collection  of multiple hospitalizations and procedures   MEDICATIONS  Medication Currently Prescribed? Olmesartan 40 mg Daily  If started since last visit: If not started since last visit: If stopped since last visit:  Cardiac Medications  ACE Inhibitor / Angiotensin Receptor Blocker (ARB) / Angiotensin Receptor Neprilysin inhibitor (ARNi)  [] No >  [] Yes > Since last visit, medication was: [] Stopped [] Not started [] Started [x] Continued same medication [] Continued with medication changes Date started:        / /             MM DD YYYY Who prescribed? [] Cardiology provider  [] Study clinic [] Outside clinic [] Endocrinology provider [] Primary care provider [] Other provider  Specify:                             [] Unknown Reason (check all that apply): [] History of swelling around lips, eyes or face [] Feeling dizzy/lightheaded [] Low blood pressure [] Poor or fluctuating kidney function [] High potassium [] Patient has experienced other side effects to this medication  before [] Patient will be unable to adhere/monitor [] Patient unable to afford it [] Patient does not want to      take this  medication [] Pregnancy [] Other (specify: ) [] Unknown Reason Date discontinued:        / /             MM DD YYYY Reason (check all that apply): [] Swelling around lips, eyes       or face [] Feeling dizzy/lightheaded [] Low blood pressure [] Poor or fluctuating kidney function [] High potassium [] Other medication side       effects [] Patient unable to        adhere/monitor [] Had an       operation/procedure that        required stopping it [] Patient unable to afford it [] Patient no longer wants       to take this medication [] Pregnancy [] Other (specify: ) [] Unknown Reason   If started or changed  Medication Name: [] Benazepril (Lotensin) [] Captopril (Capoten) [] Enalapril (Vasotec) [] Fosinopril (Monopril) [] Lisinopril (Zestril, Prinivil) [] Quinapril (Accupril) [] Ramipril  (Altace) [] Azilsartan (Edarbi) [] Candesartan (Atacand) [] Irbesartan (Avapro) [] Losartan (Cozaar) [] Olmesartan (Benicar) [] Telmisartan (Micardis) [] Valsartan (Diovan) [] Sacrubitril/Valsartan (Entresto)     Beta Blocker [] No [x]  Yes > [] Acebutolol (Sectral) [x] Bisoprolol (Zebeta) [] Carvedilol (Coreg) [] Labetalol (Trandate,      Normodyne) [] Metoprolol succinate (Toprol) [] Metoprolol tartrate       (Lopressor) [] Nadolol (Corgard) [] Nebivolol (Bystolic) [] Propranolol (Inderal) [] Sotalol (Betapace)     Medication Currently Prescribed? If started since last visit: If not started since last visit: If stopped since last visit:  Aldosterone Antagonist [x] No [] Yes > [] Amiloride [] Eplerenone (Inspra) [] Spirinolactone (Aldactone) [] Traimterene (Dyrenium)   Calcium Channel Blocker [] No [x]  Yes > Medication Name: [x] Amlodipine (Norvasc) [] Diltiazem (Cardizem) [] Felodipine (Plendil) [] Nifedipine (Procardia) [] Verapamil (Calan)   Diuretic Loop [x] No []  Yes > Medication Name: [] Bumetanide (Bumex) [] Ethacrynic acid (Edecrin) [] Furosemide (Lasix) [] Torsemide (Demadex)   Diuretic Thiazide- type [] No [x]  Yes > Medication Name: [] Chlorothiazide      [] Chlorthalidone [x] Hydrochlorothiazide [] Indapamide [] Metolazone   Anticoagulation Therapy (other than Warfarin) [x] No []  Yes > Medication Name: [] Apixaban (Eliquis) [] Edoxaban (Lixiana) [] Rivaroxaban Alen Blew) [] Dabigatran (Redaxa)   Warfarin [x] No []  Yes    Antiplatelet Agent (including aspirin) [] No [x]  Yes > Medication Name (check all that apply): [x] Aspirin [] Clopidogrel (Plavix) [] Prasugrel (Effient) [] Ticagrelor (Brilinta) [] Ticlopidine (Ticlid) [] Dipyridamole (Persantine)    Medication Currently Prescribed? Rosuvastatin 15m daily  If started since last visit: If not started since last visit: If stopped since last visit:  Statin  [] No >  [x] Yes > Since last visit, medication  was: [] Stopped [] Not started [] Started [x] Continued same medication and dose [] Continued with dose or medication changes Date started:        / /             MM DD YYYY Who prescribed? [] Cardiology provider [] Endocrinology provider [] Primary care provider [] Other provider  Specify:                             [] Unknown Reason (check all that apply): [] History of Rhabdomyolysis [] LDL-cholesterol already       <70 [] Muscle      aches/pain/weakness [] Mental       fogginess/memory loss [] Liver dysfunction [] Patient has  experienced other side   effects to this medication before [] Patient will be unable to adhere/monitor [] Patient unable to afford it [] Patient does not want to take this medication [] Pregnancy [] Other (specify: ) [] Unknown Reason Date discontinued:        / /             MM DD YYYY Reason (check all that apply): [] Rhabdomyolysis [] Muscle aches/pain/weakness [] Mental fogginess/memory loss [] Liver dysfunction []   Other medication side effects [] Patient unable to          adhere/monitor [] Patient unable to afford it [] Patient no longer wants to take       this medication [] Pregnancy [] Other (specify: ) [] Unknown Reason   If started or changed  Medication Name: [] Atorvastatin (Lipitor) [] Fluvastatin (Lescol) [] Lovastatin (Mevacor) [] Pravastatin (Pravachol) [] Rosuvastatin (Crestor) [] Simvastatin (Zocor) [] Pitatavastatin (Livalo)  Dose: [] 1 mg [] 10 mg [] 2 mg []  20 mg [] 3 mg []  40 mg [] 4 mg []  60 mg [] 5 mg []  80 mg  Frequency: [] Daily  [] Less than daily      Does the patient have statin intolerance that prevents the use of maximum dose of high potency statin? [] No [] Yes >IF YES, Complete Statin Intolerance form   Non-statin lipid lowering therapy [] No [x] Yes > Medication Name (check all that apply): [] Colesevelam (Welchol) [] Ezetimibe (Zetia) [] Fibrate [] Niacin [] PCSK9 inhibitor [x] Omega 3 acid ethyl esters (Lovaza) [] Icosapent Ethyl  (Vascepa) [] Over the counter omega 3 fatty acid or fish oil supplement     Medication Currently Prescribed? Dipagliflozole 50m daily  If started since last visit: If not started since last visit: If stopped since last visit:  Diabetes Medications  SGLT2 Inhibitor  [] No >  [x] Yes > Since last visit, medication was: [] Stopped [] Not started [] Started [] Continued same medication [] Continued with medication changes Date started:      07/10/2019        MM DD YYYY Who prescribed? [] Cardiology provider  [] Study clinic [] Outside clinic [x] Endocrinology      provider [] Primary care provider [] Other provider  Specify:                             [] Unknown Reason (check all that apply): [] eGFR <45 [] HbA1c<7% on metformin monotherapy OR already on GLP1RA and do not need to start another anti- hyperglycemic [] Already dehydrated [] Low blood pressure [] High risk of Hypoglycemia [] Prior DKA [] Recurrent mycotic genital infections [] History of or at risk for amputation [] Patient has experienced other side effects to this medication before [] Patient will be unable to adhere/monitor [] Patient unable to afford it [] Patient does not want to take this medication [] Pregnancy [] Other (specify: ) [] Unknown Reason Date discontinued:        / /             MM DD YYYY Reason (check all that apply  [] eGFR now <45 [] Dehydration [] Low blood pressure [] Hypoglycemia [] DKA [] Mycotic genital infection [] Amputation [] Other medication side effects [] Patient unable    to adhere/monitor  [] Had an operation/procedure     that required stopping it [] Patient unable to afford it [] Patient no longer wants to      take this medication [] Pregnancy [] Other (specify: ) [] Unknown Reason   If started or changed  Medication Name: [] Canaglifozin (Invokana) [x] Dapagliflozin (Wilder Glade [] Empaglifozin (Jardiance) [] Ertugliflozin (Actuary           Medication Currently Prescribed? If  started since last visit: If not started since last visit: If stopped since last visit:  GLP1 Receptor Agonist  [] No >  [] Yes > Since last visit, medication was: [x] Stopped [] Not started [] Started [] Continued same medication [] Continued with medication changes Date started:          MM DD YYYY Who prescribed? [] Cardiology provider  [] Study clinic [] Outside clinic [] Endocrinology       provider [] Primary care provider [] Other provider > Specify:                             []   Unknown Reason (check all that apply): [] Personal or family history of medullary thyroid cancer [] MEN2 [] HbA1c<7% on metformin monotherapy OR already on SGLT2i and do not need to start another anti-hyperglycemic [] eGFR now <30 [] High risk of Hypoglycemia [] History of pancreatitis [] Significant gastroparesis [] Prior gastric surgery [] Patient has experienced other side effects to this medication before [] Patient will be unable to adhere/monitor [] Patient unable to afford it [] Patient does not want to take this medication [] Pregnancy [] Other (specify: ) [] Unknown Reason Date discontinued:    04/21/2019             MM DD YYYY Reason (check all that apply): [] Medullary thyroid cancer [] MEN2 [] eGFR now <30 [] Hypoglycemia [] Pancreatitis [] Significant gastroparesis [] Gastric surgery [] Other medication side       effects []   Patient  unable    to adhere/monitor                                  [] Had an operation/procedure that required stopping it [] Patient unable to afford it [] Patient no longer wants to take this medication [] Pregnancy [] Other (specify: ) [] Unknown Reason   If started or changed > Medication Name: [] Albiglutide (Tanzeum) [] Dulaglutide (Trulicity) [] Exanatide (Byetta, Bydureon) [] Liraglutide (Victoza, Saxenda) [] Lixisenatide (Adlyxin) [] Semaglutice (Ozempic)      Medication Currently Prescribed? If started since last visit: If not started since last visit: If  stopped since last visit:  Other non Insulin diabetes medications [x] No [] Yes > Medication Name (check all that apply): [] Acarbose (Precose) [] Miglitol (Glyset) [] Glimepiride (Amaryl) [] Glipizide (Amaryl) [] Glyburide (Diabeta,       Glynase,   Micronase) [] Metformin (Fortamet,        Glucophage[including XR],        Glumetza, Riomet) [] Pioglitazone (Actos) [] Nateglinide (Starlix) [] Pramlintide (Symilin) [] Repaglinide (Prandin) [] Rosiglitazone (Avandia) [] Alogliptin (Nesina) [] Linagliptin (Tradjenta) [] Saxagliptin (Onglyza) [] Sitagliptin (Januvia) [] Bromocriptine Quick Release (Cycloset)     Insulin [] No [x]  Yes > total daily dose: 65 units     STATIN INTOLERANCE (PER EHR/OTHER SOURCE DATA)  1. Was CK checked? [x] No [] Yes   >If yes, select from the following: [] CK not elevated [] CK elevated 1-5x upper limit of normal [] CK elevated >5x upper limit of normal  2. Does the patient have muscle symptoms? [x] No [] Yes    >If yes, select from the following: Location and pattern of muscle symptoms (select all that apply) [] Symmetric, hip flexors or thighs [] Symmetric, calves [] Symmetric, proximal upper extremity [] Asymmetric, intermittent, or not specific to any area [] Unknown   Timing of muscle symptom in relation to starting statin regimen [] <4 weeks [] 4-12 weeks [] >12 weeks [] Unknown   Timing of muscle symptoms improvement after withdrawal of statin [] <2 weeks [] 2-4 weeks [] No improvement after 4 weeks [] Unknown  3. Was patient re-challenged with a statin regimen (even if same statin compound or regimen as above)?  [] No  [] Yes  [] Unknown  >If yes, select from the following: Timing of recurrence of similar muscle symptoms in relation to starting second regimen [] <4 weeks [] 4-12 weeks [] >12 weeks [] Similar symptoms did not recur [] Unknown   [email or web address]   spoke with patient via phone for COORDINATE DM 6 month call. Pt doing  well. Pt took herself off Ozempic in August due to nausea and vomiting. She has started on Dapagliflozin 5 mg daily. Her first does was today. Education provided. Next call scheduled for December.

## 2019-07-17 ENCOUNTER — Encounter (INDEPENDENT_AMBULATORY_CARE_PROVIDER_SITE_OTHER): Payer: Self-pay

## 2019-07-18 ENCOUNTER — Other Ambulatory Visit: Payer: Self-pay | Admitting: Internal Medicine

## 2019-07-18 DIAGNOSIS — F411 Generalized anxiety disorder: Secondary | ICD-10-CM

## 2019-07-19 ENCOUNTER — Other Ambulatory Visit: Payer: Self-pay

## 2019-08-04 ENCOUNTER — Other Ambulatory Visit: Payer: Self-pay | Admitting: Internal Medicine

## 2019-08-04 DIAGNOSIS — F419 Anxiety disorder, unspecified: Secondary | ICD-10-CM

## 2019-08-05 DIAGNOSIS — I2584 Coronary atherosclerosis due to calcified coronary lesion: Secondary | ICD-10-CM | POA: Insufficient documentation

## 2019-08-05 DIAGNOSIS — I2510 Atherosclerotic heart disease of native coronary artery without angina pectoris: Secondary | ICD-10-CM | POA: Insufficient documentation

## 2019-08-05 NOTE — Progress Notes (Signed)
Cardiology Office Note   Date:  08/06/2019   ID:  Karen, [name] 12/27/1945, MRN [id number]  PCP:  [name], MD  Cardiologist:   Minus Breeding, MD   No chief complaint on file.     History of Present Illness: [name] is a 73 y.o. female who presents for follow up of PVD.  She has had  peripheral vascular disease as below with stenting.  She is not had any prior cardiac history.  She has been noted in the past on CTs to follow-up pulmonary nodules to have aortic atherosclerosis and coronary artery atherosclerosis.  She had a stress test many years ago.  Echo in 2018 demonstrated NL systolic function.   In March 2020 she had negative Lexiscan Myoview.  She is being followed by Dr. Servando Snare for a lung mass.   This was stable on recent imaging and is being followed.     She continues to have significant dyspnea with exertion.  She wonders if this could be secondary to her PVD.  The patient denies any new symptoms such as chest discomfort, neck or arm discomfort. There has been no new PND or orthopnea. There have been no reported palpitations, presyncope or syncope.    Past Medical History:  Diagnosis Date  . Abnormal findings on esophagogastroduodenoscopy (EGD) 07/2010  . 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 Anxiety   . Colon polyps   . Diabetes mellitus 1998  . Diverticulosis   . ESOPHAGEAL STRICTURE 08/27/2008  . GERD (gastroesophageal reflux disease)   . Hemorrhoids   . Hiatal hernia   . Hyperlipidemia   . Hypertension 2000  . Migraines   . Peripheral vascular disease ([place])   . Phlebitis    30 years ago  left leg  . Stroke [facility])    multiple mini strokes ( brain aneurysm )    Past Surgical History:  Procedure Laterality Date  . ABDOMINAL AORTAGRAM N/A 01/04/2012   Procedure: ABDOMINAL Maxcine Ham;  Surgeon: Serafina Mitchell, MD;  Location: [facility] CATH LAB;  Service: Cardiovascular;  Laterality: N/A;  . ABDOMINAL  AORTAGRAM N/A 08/15/2012   Procedure: ABDOMINAL Maxcine Ham;  Surgeon: Serafina Mitchell, MD;  Location: [facility] CATH LAB;  Service: Cardiovascular;  Laterality: N/A;  . ABDOMINAL HYSTERECTOMY  1984  . cataract surgery Right 10-22-2015  . cataract surgery Left 11-05-2015  . CESAREAN SECTION    . CHOLECYSTECTOMY    . COLONOSCOPY  07/2010  . DENTAL SURGERY  Aug. 16, 2013   left lower   . ESOPHAGOGASTRODUODENOSCOPY  07/2010  . EYE SURGERY  Nov. 2014   Laser-Glaucoma  . FEMORAL ARTERY STENT  05/11/11   Left superficial femoral and popliteal artery  . HERNIA REPAIR     times two  . KNEE SURGERY    . LOWER EXTREMITY ANGIOGRAM Left 08/15/2012   Procedure: LOWER EXTREMITY ANGIOGRAM;  Surgeon: Serafina Mitchell, MD;  Location: [facility] CATH LAB;  Service: Cardiovascular;  Laterality: Left;  lt leg angio  . rotator cuff surgery    . THYROID SURGERY       Current Outpatient Medications  Medication Sig Dispense Refill  . amLODipine (NORVASC) 10 MG tablet Take 1 tablet Daily for BP 90 tablet 3  . aspirin EC 81 MG tablet Take 81 mg by mouth at bedtime.     . bisoprolol-hydrochlorothiazide (ZIAC) 10-6.25 MG tablet Take 1 tablet by mouth daily.  90 tablet 3  . Blood Glucose Monitoring Suppl (ACCU-CHEK AVIVA PLUS) W/DEVICE KIT Check blood sugar up to three times daily 1 kit 0  . Cholecalciferol (VITAMIN D PO) Take 5,000 Units by mouth daily.    Marland Kitchen CINNAMON PO Take 1,000 mg by mouth 2 (two) times daily.     . citalopram (CELEXA) 40 MG tablet Take 1 tablet Daily for Mood & Anxiety 90 tablet 3  . clonazePAM (KLONOPIN) 0.5 MG tablet Take 1/2 to 1 tablet at Bedtime ONLY if needed for Sleep  & please try to limit to 5 days /week to avoid addiction or Dementia with regular use 30 tablet 0  . dapagliflozin propanediol (FARXIGA) 5 MG TABS tablet Take 5 mg by mouth daily before breakfast. 30 tablet 5  . glucose blood (ACCU-CHEK AVIVA PLUS) test strip Use daily to check BS TID Dx. B71.69 300 each 1  . insulin NPH-regular  Human (NOVOLIN 70/30) (70-30) 100 UNIT/ML injection INJECT 35 UNITS IN THE AM before FOOD, AND INJECT 30 UNITS before the EVENING MEAL. 30 mL 2  . Insulin Syringe-Needle U-100 31G X 15/64" 0.5 ML MISC Use two pens daily with insulin 100 each 3  . Magnesium 500 MG TABS Take 500 mg by mouth daily.    Marland Kitchen olmesartan (BENICAR) 40 MG tablet Take 1 tablet Daily for BP 90 tablet 3  . Omega-3 Fatty Acids (FISH OIL) 1200 MG CAPS Take 1,200 capsules by mouth daily.     Marland Kitchen omeprazole (PRILOSEC) 40 MG capsule TAKE 1 CAPSULE BY MOUTH EVERY DAY 90 capsule 2  . OVER THE COUNTER MEDICATION Take 1 tablet by mouth daily. One a day vitamin Woman's one a day    . rosuvastatin (CRESTOR) 40 MG tablet Take 1 /2 to 1 tablet Daily for Cholesterol 90 tablet 1  . vitamin E 400 UNIT capsule Take 400 Units by mouth daily.     No current facility-administered medications for this visit.     Allergies:   Ciprofloxacin, Ozempic (0.25 or 0.5 mg-dose) [semaglutide(0.25 or 0.67m-dos)], Plavix [clopidogrel bisulfate], Amoxicillin, Ace inhibitors, Ciprofloxacin hcl, Lyrica [pregabalin], and Penicillins    ROS:  Please see the history of present illness.   Otherwise, review of systems are positive for none.   All other systems are reviewed and negative.    PHYSICAL EXAM:bil VS:  BP (!) 144/60   Pulse (!) 54   Ht 5' 6"  (1.676 m)   Wt 186 lb (84.4 kg)   SpO2 97%   BMI 30.02 kg/m  , BMI Body mass index is 30.02 kg/m. GENERAL:  Well appearing NECK:  No jugular venous distention, waveform within normal limits, carotid upstroke brisk and symmetric, bilaterbruits, no thyromegaly LUNGS:  Clear to auscultation bilaterally BACK:  No CVA tenderness CHEST:  Unremarkable HEART:  PMI not displaced or sustained,S1 and S2 within normal limits, no S3, no S4, no clicks, no rubs, no murmurs ABD:  Flat, positive bowel sounds normal in frequency in pitch, no bruits, no rebound, no guarding, no midline pulsatile mass, no hepatomegaly, no  splenomegaly EXT:  2 plus pulses 2+ posterior tibialis on the right, absent posterior tibialis on the left, absent dorsalis pedis bilaterally, no edema, no cyanosis no clubbing   EKG:  EKG is not ordered today.    Recent Labs: 11/13/2018: ALT 21; BUN 16; Creat 1.59; Hemoglobin 16.5; Magnesium 1.9; Platelets 243; Potassium 4.8; Sodium 143; TSH 1.65    Lipid Panel    Component Value Date/Time   CHOL 134  11/13/2018 1129   TRIG 131 11/13/2018 1129   HDL 40 (L) 11/13/2018 1129   CHOLHDL 3.4 11/13/2018 1129   VLDL 29 01/26/2017 0440   LDLCALC 73 11/13/2018 1129   LDLDIRECT 146.4 01/30/2010 1056      Wt Readings from Last 3 Encounters:  08/06/19 186 lb (84.4 kg)  07/09/19 188 lb (85.3 kg)  04/19/19 187 lb (84.8 kg)      Other studies Reviewed: Additional studies/ records that were reviewed today include: PFTs. Review of the above records demonstrates:  Please see elsewhere in the note.     ASSESSMENT AND PLAN:  AORTIC CALCIFICATION:    We talked about risk reduction.  This is addressed below.  CORONARY CALCIFICATION:  She had a negative Lexiscan Myoview.    She will continue with risk reduction.   SOB:   This is chronic.  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  She needs to see a pulmonologist.  If she is seen and managed by them and continues to have progressive dyspnea I might consider further coronary evaluation but I do not think this is an ischemic etiology.   HTN:     Blood pressure is borderline.  No change in therapy.   DYSLIPIDEMIA:Her LDL was 73 with an HDL of 40.   She will continue the meds as listed.  DM:Her A1c is greater than 10.   She is now having this followed by Dr. Cruzita Lederer.    TOBACCO ABUSE:   She has previously been reluctant to discuss this.    We talked about this today though.   PVD:   She has is followed by [facility]  Current medicines are reviewed at length with the patient today.   The patient does not have concerns regarding medicines.  The following changes have been made:  no change  Labs/ tests ordered today include: None  Orders Placed This Encounter  Procedures  . Ambulatory referral to Pulmonology     Disposition:   FU with me in 4 months.     Signed, Minus Breeding, MD  08/06/2019 10:13 AM    [facility]

## 2019-08-06 ENCOUNTER — Ambulatory Visit (INDEPENDENT_AMBULATORY_CARE_PROVIDER_SITE_OTHER): Payer: Medicare Other | Admitting: Cardiology

## 2019-08-06 ENCOUNTER — Encounter: Payer: Self-pay | Admitting: Cardiology

## 2019-08-06 ENCOUNTER — Other Ambulatory Visit: Payer: Self-pay

## 2019-08-06 VITALS — BP 144/60 | HR 54 | Ht 66.0 in | Wt 186.0 lb

## 2019-08-06 DIAGNOSIS — I779 Disorder of arteries and arterioles, unspecified: Secondary | ICD-10-CM | POA: Diagnosis not present

## 2019-08-06 DIAGNOSIS — I10 Essential (primary) hypertension: Secondary | ICD-10-CM

## 2019-08-06 DIAGNOSIS — I700 Atherosclerosis of aorta: Secondary | ICD-10-CM

## 2019-08-06 DIAGNOSIS — I2584 Coronary atherosclerosis due to calcified coronary lesion: Secondary | ICD-10-CM | POA: Diagnosis not present

## 2019-08-06 DIAGNOSIS — E785 Hyperlipidemia, unspecified: Secondary | ICD-10-CM

## 2019-08-06 DIAGNOSIS — R0602 Shortness of breath: Secondary | ICD-10-CM

## 2019-08-06 DIAGNOSIS — Z720 Tobacco use: Secondary | ICD-10-CM

## 2019-08-06 DIAGNOSIS — I2510 Atherosclerotic heart disease of native coronary artery without angina pectoris: Secondary | ICD-10-CM | POA: Diagnosis not present

## 2019-08-06 NOTE — Patient Instructions (Addendum)
Medication Instructions:  Your physician recommends that you continue on your current medications as directed. Please refer to the Current Medication list given to you today.  If you need a refill on your cardiac medications before your next appointment, please call your pharmacy.   Lab work: NONE  Testing/Procedures: NONE  Follow-Up: At [facility], you and your health needs are our priority.  As part of our continuing mission to provide you with exceptional heart care, we have created designated Provider Care Teams.  These Care Teams include your primary Cardiologist (physician) and Advanced Practice Providers (APPs -  Physician Assistants and Nurse Practitioners) who all work together to provide you with the care you need, when you need it. You may see Minus Breeding, MD or one of the following Advanced Practice Providers on your designated Care Team:    Rosaria Ferries, PA-C  Jory Sims, DNP, ANP  Cadence Kathlen Mody, NP   Your physician wants you to follow-up in: 4 months.   Any Other Special Instructions Will Be Listed Below (If Applicable). You have been referred to Pulmonology. You will receive a phone call.

## 2019-08-20 ENCOUNTER — Other Ambulatory Visit: Payer: Self-pay | Admitting: Internal Medicine

## 2019-08-20 DIAGNOSIS — F411 Generalized anxiety disorder: Secondary | ICD-10-CM

## 2019-08-21 ENCOUNTER — Encounter: Payer: Medicare Other | Attending: Internal Medicine | Admitting: *Deleted

## 2019-08-21 ENCOUNTER — Other Ambulatory Visit: Payer: Self-pay

## 2019-08-21 DIAGNOSIS — E1159 Type 2 diabetes mellitus with other circulatory complications: Secondary | ICD-10-CM

## 2019-08-21 DIAGNOSIS — Z794 Long term (current) use of insulin: Secondary | ICD-10-CM | POA: Diagnosis present

## 2019-08-21 NOTE — Patient Instructions (Signed)
Plan:  Continue spreading your carbohydrate foods evenly throughout the day. Include protein in moderation with your meals and snacks Consider reading food labels for Total Carbohydrate of foods Consider  increasing your activity level by doing some chair exercises or using your stationary bike for 5 minutes twice daily as tolerated Monitor your blood sugars as you increase your activity level and notify your MD, Dr. Wardell Heath if you start having lower blood sugars so she can adjust your insulin doses Consider checking BG at alternate times per day  Continue taking medication as directed by MD

## 2019-08-21 NOTE — Progress Notes (Signed)
Diabetes Self-Management Education  Visit Type: First/Initial   This visit was completed via telephone and My Chart virtual visit due to the COVID-19 pandemic.   I spoke with [name] and verified that I was speaking with the correct person with two patient identifiers (full name and date of birth).   I discussed the limitations related to this kind of visit and the patient is willing to proceed.  Appt. Start Time: 1535 Appt. End Time: 1625  08/21/2019  [name], identified by name and date of birth, is a 73 y.o. female with a diagnosis of Diabetes: Type 2.   ASSESSMENT  There were no vitals taken for this visit. There is no height or weight on file to calculate BMI.  Diabetes Self-Management Education - 08/21/19 1649      Visit Information   Visit Type  First/Initial      Initial Visit   Diabetes Type  Type 2    Are you currently following a meal plan?  No   but avoids higher carbohydrate foods and has lowered her fat intake   Are you taking your medications as prescribed?  Yes    Date Diagnosed  2000      Health Coping   How would you rate your overall health?  Good      Psychosocial Assessment   Patient Belief/Attitude about Diabetes  Motivated to manage diabetes    Self-care barriers  None    Other persons present  Patient    Patient Concerns  Nutrition/Meal planning;Glycemic Control    Special Needs  None    Preferred Learning Style  Auditory    Learning Readiness  Change in progress    How often do you need to have someone help you when you read instructions, pamphlets, or other written materials from your doctor or pharmacy?  1 - Never    What is the last grade level you completed in school?  12 plus extra training on computer skills      Pre-Education Assessment   Patient understands the diabetes disease and treatment process.  Demonstrates understanding / competency    Patient understands incorporating nutritional management into lifestyle.  Needs  Review    Patient undertands incorporating physical activity into lifestyle.  Needs Review    Patient understands using medications safely.  Needs Review    Patient understands monitoring blood glucose, interpreting and using results  Demonstrates understanding / competency    Patient understands prevention, detection, and treatment of acute complications.  Demonstrates understanding / competency    Patient understands prevention, detection, and treatment of chronic complications.  Demonstrates understanding / competency    Patient understands how to develop strategies to address psychosocial issues.  Demonstrates understanding / competency    Patient understands how to develop strategies to promote health/change behavior.  Demonstrates understanding / competency      Complications   Last HgB A1C per patient/outside source  9.1 %    How often do you check your blood sugar?  1-2 times/day    Fasting Blood glucose range (mg/dL)  70-129    Number of hypoglycemic episodes per month  3    Can you tell when your blood sugar is low?  Yes    What do you do if your blood sugar is low?  coffee with creamer    Have you had a dilated eye exam in the past 12 months?  Yes    Have you had a dental exam in the past  12 months?  Yes    Are you checking your feet?  Yes    How many days per week are you checking your feet?  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   How many days per week to you exercise?   --   has stationary bke, that is not painful to ride     Patient Education   Nutrition management   Role of diet in the treatment of diabetes and the relationship between the three main macronutrients and blood glucose level;Carbohydrate counting;Meal options for control of blood glucose level and chronic complications.    Physical activity and exercise   Role of exercise on diabetes management, blood pressure control and cardiac health.;Helped patient identify appropriate exercises in relation to his/her diabetes, diabetes complications and other health issue.    Medications  Reviewed patients medication for diabetes, action, purpose, timing of dose and side effects.    Monitoring  Identified appropriate SMBG and/or A1C goals.      Individualized Goals (developed by patient)   Nutrition  General guidelines for healthy choices and portions discussed    Physical Activity  Exercise 3-5 times per week    Medications  take my medication as prescribed    Monitoring   test blood glucose pre and post meals as discussed      Post-Education Assessment   Patient understands incorporating nutritional management into lifestyle.  Demonstrates understanding / competency    Patient undertands incorporating physical activity into lifestyle.  Demonstrates understanding / competency    Patient understands using medications safely.  Demonstrates understanding / competency    Patient understands monitoring blood glucose, interpreting and using results  Demonstrates understanding / competency    Patient understands prevention, detection, and treatment of acute complications.  Demonstrates understanding / competency    Patient understands prevention, detection, and treatment of chronic complications.  Demonstrates understanding / competency    Patient understands how to develop strategies to address psychosocial issues.  Demonstrates understanding / competency    Patient understands how to develop strategies to  promote health/change behavior.  Demonstrates understanding / competency      Outcomes   Expected Outcomes  Demonstrated interest in learning. Expect positive outcomes    Future DMSE  PRN    Program Status  Not Completed       Individualized Plan for Diabetes Self-Management Training:   Learning Objective:  Patient will have a greater understanding of diabetes self-management. Patient education plan is to attend individual and/or group sessions per assessed needs and concerns.   Plan:   Patient Instructions  Plan:  Continue spreading your carbohydrate foods evenly throughout the day. Include protein in moderation with your meals and snacks Consider reading food labels for Total Carbohydrate of foods Consider  increasing your activity level by doing some chair  exercises or using your stationary bike for 5 minutes twice daily as tolerated Monitor your blood sugars as you increase your activity level and notify your MD, Dr. Wardell Heath if you start having lower blood sugars so she can adjust your insulin doses Consider checking BG at alternate times per day  Continue taking medication as directed by MD  Expected Outcomes:  Demonstrated interest in learning. Expect positive outcomes  Education material provided: A1C conversion sheet  If problems or questions, patient to contact team via:  Phone  Future DSME appointment: PRN

## 2019-08-27 ENCOUNTER — Encounter: Payer: Self-pay | Admitting: Critical Care Medicine

## 2019-08-27 ENCOUNTER — Other Ambulatory Visit: Payer: Self-pay

## 2019-08-27 ENCOUNTER — Ambulatory Visit (INDEPENDENT_AMBULATORY_CARE_PROVIDER_SITE_OTHER): Payer: Medicare Other | Admitting: Critical Care Medicine

## 2019-08-27 VITALS — BP 136/70 | HR 54 | Temp 97.4°F | Ht 67.72 in | Wt 187.4 lb

## 2019-08-27 DIAGNOSIS — J449 Chronic obstructive pulmonary disease, unspecified: Secondary | ICD-10-CM

## 2019-08-27 DIAGNOSIS — R0602 Shortness of breath: Secondary | ICD-10-CM

## 2019-08-27 DIAGNOSIS — R918 Other nonspecific abnormal finding of lung field: Secondary | ICD-10-CM | POA: Diagnosis not present

## 2019-08-27 DIAGNOSIS — Z720 Tobacco use: Secondary | ICD-10-CM | POA: Diagnosis not present

## 2019-08-27 MED ORDER — VARENICLINE TARTRATE 1 MG PO TABS: 1.0000 mg | ORAL_TABLET | Freq: Two times a day (BID) | ORAL | 3 refills | Status: DC

## 2019-08-27 MED ORDER — VARENICLINE TARTRATE 0.5 MG PO TABS: 0.5000 mg | ORAL_TABLET | Freq: Two times a day (BID) | ORAL | 0 refills | Status: DC

## 2019-08-27 MED ORDER — ALBUTEROL SULFATE HFA 108 (90 BASE) MCG/ACT IN AERS: 2.0000 | INHALATION_SPRAY | RESPIRATORY_TRACT | 11 refills | Status: DC | PRN

## 2019-08-27 MED ORDER — SPIRIVA RESPIMAT 2.5 MCG/ACT IN AERS: 1.0000 | INHALATION_SPRAY | Freq: Every day | RESPIRATORY_TRACT | 11 refills | Status: DC

## 2019-08-27 NOTE — Progress Notes (Signed)
Synopsis: Referred in December 2020 for shortness of breath by Minus Breeding, MD.  Subjective:   PATIENT ID: [name] GENDER: female DOB: 1946-07-21, MRN: [id number]  Chief Complaint  Patient presents with  . Consult    Patient is here for shortness of breath with exertion.     Ms. [name] is a 73 year old woman with a history of previously undiagnosed COPD who presents for evaluation of dyspnea on exertion.  This has been going on for about a year, gradually worsening over the last few months.  She no longer does the grocery shopping, but would have to lean on the shopping cart.  She gets dyspneic with doing housework and even from walking to the patient room from the lobby.  She gets wheezing associated with shortness of breath.  She denies cough and sputum production.  She has never had an exacerbation in the past where she required antibiotics or steroids.  She had PFTs to evaluate dyspnea in July 2020.  She has not used inhalers, but was previously prescribed Anoro.  She was unable to use this due to coughing fits from the dry powder.  She has a history of chronic PAD with frequent leg heaviness and pain, which she thinks worsens her shortness of breath.  She is up-to-date on her pneumonia and flu vaccines.  She continues to smoke about half a pack per day.  She only ever smokes at home, so she has been smoking more since working from home.  At her heaviest she smoked about a pack a day.  She does not smoke the full cigarette.  She is been doing this for about 50 years and is interested in quitting.  She has tried patches in the past and a nutritional supplement called kick it, but every time after she stopped these her urge to smoke was even worse.  Her husband quit smoking in June 2020.  She does not vape.  She has a history of pulmonary nodules for which she follows with thoracic surgery-Dr. Servando Snare.       Past Medical History:  Diagnosis Date  . Abnormal findings on  esophagogastroduodenoscopy (EGD) 07/2010  . 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 Anxiety   . Colon polyps   . Diabetes mellitus 1998  . Diverticulosis   . ESOPHAGEAL STRICTURE 08/27/2008  . GERD (gastroesophageal reflux disease)   . Hemorrhoids   . Hiatal hernia   . Hyperlipidemia   . Hypertension 2000  . Migraines   . Peripheral vascular disease ([place])   . Phlebitis    30 years ago  left leg  . Stroke [facility])    multiple mini strokes ( brain aneurysm )     Family History  Problem Relation Age of Onset  . CAD Mother 37       Died of MI  . Hypertension Mother   . Heart attack Mother   . Heart disease Mother   . CAD Father 34       Died of MI  . Heart disease Father   . Heart attack Father   . CAD Brother 28       Two brothers died of MI  . Heart attack Brother   . Heart disease Brother        Amputation  . Diabetes Sister   . Hypertension Sister   . Heart attack Brother   . Heart disease Brother   .  Stroke Sister   . Colon cancer Neg Hx   . Stomach cancer Neg Hx   . Esophageal cancer Neg Hx      Past Surgical History:  Procedure Laterality Date  . ABDOMINAL AORTAGRAM N/A 01/04/2012   Procedure: ABDOMINAL Maxcine Ham;  Surgeon: Serafina Mitchell, MD;  Location[facility] C[facility] LAB;  Service: Cardiovascular;  Laterality: N/A;  . ABDOMINAL AORTAGRAM N/A 08/15/2012   Procedure: ABDOMINAL Maxcine Ham;  Surgeon: Serafina Mitchell, MD;  Location: [facility] CATH LAB;  Service: Cardiovascular;  Laterality: N/A;  . ABDOMINAL HYSTERECTOMY  1984  . cataract surgery Right 10-22-2015  . cataract surgery Left 11-05-2015  . CESAREAN SECTION    . CHOLECYSTECTOMY    . COLONOSCOPY  07/2010  . DENTAL SURGERY  Aug. 16, 2013   left lower   . ESOPHAGOGASTRODUODENOSCOPY  07/2010  . EYE SURGERY  Nov. 2014   Laser-Glaucoma  . FEMORAL ARTERY STENT  05/11/11   Left superficial femoral and popliteal artery  . HERNIA REPAIR     times two  . KNEE SURGERY    . LOWER  EXTREMITY ANGIOGRAM Left 08/15/2012   Procedure: LOWER EXTREMITY ANGIOGRAM;  Surgeon: Serafina Mitchell, MD;  Location: [facility] CATH LAB;  Service: Cardiovascular;  Laterality: Left;  lt leg angio  . rotator cuff surgery    . THYROID SURGERY      Social History   Socioeconomic History  . Marital status: Married    Spouse name: Not on file  . Number of children: 2  . Years of education: Not on file  . Highest education level: Not on file  Occupational History  . 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 Financial resource strain: Not on file  . Food insecurity    Worry: Not on file    Inability: Not on file  . Transportation needs    Medical: Not on file    Non-medical: Not on file  Tobacco Use  . Smoking status: Current Every Day Smoker    Packs/day: 0.25    Years: 40.00    Pack years: 10.00    Types: Cigarettes  . Smokeless tobacco: Never Used  Substance and Sexual Activity  . Alcohol use: No    Alcohol/week: 0.0 standard drinks  . Drug use: No  . Sexual activity: Not on file  Lifestyle  . Physical activity    Days per week: Not on file    Minutes per session: Not on file  . Stress: Not on file  Relationships  . Social Herbalist on phone: Not on file    Gets together: Not on file    Attends religious service: Not on file    Active member of club or organization: Not on file    Attends meetings of clubs or organizations: Not on file    Relationship status: Not on file  . Intimate partner violence    Fear of current or ex partner: Not on file    Emotionally abused: Not on file    Physically abused: Not on file    Forced sexual activity: Not on file  Other Topics Concern  . Not on file  Social History Narrative   Lives with husband.          Allergies  Allergen Reactions  . Ciprofloxacin Other (See Comments)  . Ozempic (0.25 Or 0.5 Mg-Dose) [Semaglutide(0.25 Or 0.65m-Dos)] Nausea And Vomiting  . Plavix [Clopidogrel  Bisulfate] Palpitations  . Amoxicillin Itching and  Swelling    FACE & EYES SWELL  . Ace Inhibitors   . Ciprofloxacin Hcl Swelling  . Lyrica [Pregabalin]     Itch, gain weight  . Penicillins Other (See Comments)    REACTION: unspecified     Immunization History  Administered Date(s) Administered  . DT 03/26/2015  . Fluad Quad(high Dose 65+) 07/09/2019  . Influenza Whole 08/02/2007, 06/18/2009, 07/09/2013  . Influenza, High Dose Seasonal PF 08/05/2015, 07/22/2016, 07/04/2017  . Pneumococcal Conjugate-13 07/22/2016  . Pneumococcal Polysaccharide-23 02/08/2014  . Td 09/21/2004    Outpatient Medications Prior to Visit  Medication Sig Dispense Refill  . amLODipine (NORVASC) 10 MG tablet Take 1 tablet Daily for BP 90 tablet 3  . aspirin EC 81 MG tablet Take 81 mg by mouth at bedtime.     . bisoprolol-hydrochlorothiazide (ZIAC) 10-6.25 MG tablet Take 1 tablet by mouth daily. 90 tablet 3  . Blood Glucose Monitoring Suppl (ACCU-CHEK AVIVA PLUS) W/DEVICE KIT Check blood sugar up to three times daily 1 kit 0  . Cholecalciferol (VITAMIN D PO) Take 5,000 Units by mouth daily.    Marland Kitchen CINNAMON PO Take 1,000 mg by mouth 2 (two) times daily.     . citalopram (CELEXA) 40 MG tablet Take 1 tablet Daily for Mood & Anxiety 90 tablet 3  . clonazePAM (KLONOPIN) 0.5 MG tablet TAKE 1/2-1 TAB AT BEDTIME IF NEEDED FOR SLEEP & TRY TO LIMIT TO 5 DAYS/WEEK TO AVOID ADDICTION 30 tablet 0  . dapagliflozin propanediol (FARXIGA) 5 MG TABS tablet Take 5 mg by mouth daily before breakfast. 30 tablet 5  . glucose blood (ACCU-CHEK AVIVA PLUS) test strip Use daily to check BS TID Dx. L93.79 300 each 1  . insulin NPH-regular Human (NOVOLIN 70/30) (70-30) 100 UNIT/ML injection INJECT 35 UNITS IN THE AM before FOOD, AND INJECT 30 UNITS before the EVENING MEAL. 30 mL 2  . Insulin Syringe-Needle U-100 31G X 15/64" 0.5 ML MISC Use two pens daily with insulin 100 each 3  . Magnesium 500 MG TABS Take 500 mg by mouth daily.    Marland Kitchen  olmesartan (BENICAR) 40 MG tablet Take 1 tablet Daily for BP 90 tablet 3  . Omega-3 Fatty Acids (FISH OIL) 1200 MG CAPS Take 1,200 capsules by mouth daily.     Marland Kitchen omeprazole (PRILOSEC) 40 MG capsule TAKE 1 CAPSULE BY MOUTH EVERY DAY 90 capsule 2  . OVER THE COUNTER MEDICATION Take 1 tablet by mouth daily. One a day vitamin Woman's one a day    . rosuvastatin (CRESTOR) 40 MG tablet Take 1 /2 to 1 tablet Daily for Cholesterol 90 tablet 1  . vitamin E 400 UNIT capsule Take 400 Units by mouth daily.     No facility-administered medications prior to visit.     Review of Systems  Constitutional: Negative for chills, fever and weight loss.  HENT: Negative.   Eyes: Negative.   Respiratory: Positive for shortness of breath and wheezing. Negative for cough and sputum production.   Cardiovascular: Negative for chest pain.       Chronic RLE edema from PAD  Gastrointestinal: Negative for abdominal pain, blood in stool, diarrhea, heartburn, nausea and vomiting.  Genitourinary: Negative.   Musculoskeletal: Positive for back pain. Negative for joint pain.       Leg pain from PAD  Skin: Negative for rash.  Neurological:       Chronic R hand weakness from CVA, altered sensation in 3 toes  Endo/Heme/Allergies: Negative for environmental allergies.  Psychiatric/Behavioral: Negative.      Objective:   Vitals:   08/27/19 1025  BP: 136/70  Pulse: (!) 54  Temp: (!) 97.4 F (36.3 C)  TempSrc: Temporal  SpO2: 94%  Weight: 187 lb 6.4 oz (85 kg)  Height: 5' 7.72" (1.72 m)   94% on   RA BMI Readings from Last 3 Encounters:  08/27/19 28.73 kg/m  08/06/19 30.02 kg/m  07/09/19 30.34 kg/m   Wt Readings from Last 3 Encounters:  08/27/19 187 lb 6.4 oz (85 kg)  08/06/19 186 lb (84.4 kg)  07/09/19 188 lb (85.3 kg)    Physical Exam Vitals signs reviewed.  Constitutional:      General: She is not in acute distress.    Appearance: She is not ill-appearing.  HENT:     Head: Normocephalic and  atraumatic.     Nose:     Comments: Deferred due to masking requirement.    Mouth/Throat:     Comments: Deferred due to masking requirement. Eyes:     General: No scleral icterus. Neck:     Musculoskeletal: Neck supple.  Cardiovascular:     Rate and Rhythm: Normal rate and regular rhythm.     Heart sounds: No murmur.  Pulmonary:     Comments: Breathing comfortably on RA, no conversational dyspnea. CTAB. Abdominal:     General: There is no distension.     Palpations: Abdomen is soft.     Tenderness: There is no abdominal tenderness.  Musculoskeletal:        General: No swelling or deformity.  Lymphadenopathy:     Cervical: No cervical adenopathy.  Skin:    General: Skin is warm and dry.     Findings: No rash.  Neurological:     General: No focal deficit present.     Mental Status: She is alert.     Coordination: Coordination normal.  Psychiatric:        Mood and Affect: Mood normal.        Behavior: Behavior normal.      CBC    Component Value Date/Time   WBC 10.3 11/13/2018 1129   RBC 4.95 11/13/2018 1129   HGB 16.5 (H) 11/13/2018 1129   HCT 48.3 (H) 11/13/2018 1129   PLT 243 11/13/2018 1129   MCV 97.6 11/13/2018 1129   MCH 33.3 (H) 11/13/2018 1129   MCHC 34.2 11/13/2018 1129   RDW 11.1 11/13/2018 1129   LYMPHSABS 2,266 11/13/2018 1129   MONOABS 0.7 06/06/2018 1342   EOSABS 72 11/13/2018 1129   BASOSABS 93 11/13/2018 1129     CMP Latest Ref Rng & Units 11/13/2018 08/28/2018 07/19/2018  Glucose 65 - 99 mg/dL 342(H) 219(H) 201(H)  BUN 7 - 25 mg/dL _0 Creatinine 0.60 - 0.93 mg/dL 1.59(H) 1.44(H) 1.33(H)  Sodium 135 - 146 mmol/L 143 141 140  Potassium 3.5 - 5.3 mmol/L 4.8 4.9 4.4  Chloride 98 - 110 mmol/L 102 104 101  CO2 20 - 32 mmol/L 32 33(H) 32  Calcium 8.6 - 10.4 mg/dL 9.5 9.3 9.2  Total Protein 6.1 - 8.1 g/dL 6.8 6.3 6.0(L)  Total Bilirubin 0.2 - 1.2 mg/dL 0.6 0.5 0.5  Alkaline Phos 38 - 126 U/L - - -  AST 10 - 35 U/L _1 ALT 6 - 29 U/L  _2 Chest Imaging- films reviewed: CT chest 04/19/2019-centrilobular emphysema medial groundglass opacity in the right upper lobe GG and superior segment LLL, anterior  RUL.  Minimal focal bronchiectatic change lateral RML.  Calcified peripheral RLL nodule.  Minimal lymph node calcification in the mediastinum, appears chronic.  No significant mediastinal or hilar adenopathy.  Pulmonary Functions Testing Results: PFT Results Latest Ref Rng & Units 01/08/2019  FVC-Pre L 1.93  FVC-Predicted Pre % 60  FVC-Post L 2.03  FVC-Predicted Post % 64  Pre FEV1/FVC % % 63  Post FEV1/FCV % % 60  FEV1-Pre L 1.21  FEV1-Predicted Pre % 50  FEV1-Post L 1.21  DLCO UNC% % 74  DLCO COR %Predicted % 105  TLC L 4.82  TLC % Predicted % 90  RV % Predicted % 121   Moderate obstruction without sniffing and bronchodilator reversibility.  Mild air trapping without hyperinflation.  Mild diffusion impairment.  Echocardiogram 01/26/2017: LVEF 60 to 19%, grade 1 diastolic dysfunction is present.  Normal LA, RV.  Dilated IVC with minimal respirophasic changes.  Mild TR, otherwise normal valves.  SPECT myocardial scan 11/23/2018: EF 56%, no ST segment deviations during stress, low risk study.  Hypertensive.    Assessment & Plan:     ICD-10-CM   1. SOB (shortness of breath)  R06.02 Tiotropium Bromide Monohydrate (SPIRIVA RESPIMAT) 2.5 MCG/ACT AERS    albuterol (VENTOLIN HFA) 108 (90 Base) MCG/ACT inhaler  2. Chronic obstructive pulmonary disease, unspecified COPD type (HCC)  J44.9 Tiotropium Bromide Monohydrate (SPIRIVA RESPIMAT) 2.5 MCG/ACT AERS    albuterol (VENTOLIN HFA) 108 (90 Base) MCG/ACT inhaler    varenicline (CHANTIX) 0.5 MG tablet    varenicline (CHANTIX) 1 MG tablet  3. Tobacco abuse  Z72.0 varenicline (CHANTIX) 0.5 MG tablet    varenicline (CHANTIX) 1 MG tablet  4. Lung nodule, multiple  R91.8      COPD with DOE; GOLD B -Start Spiriva Respimat daily- proper inhaler technique reviewed.  -Albuterol as needed and spacer provided. -Recommend smoking cessation. -Continue COVID-19 precautions-social distancing, mask wearing, handwashing -Up-to-date on seasonal flu and pneumonia vaccines.  Pulmonary nodules- no significant RUL GGN -Continue follow-up with Dr. Servando Snare in [facility]  Tobacco abuse with history of PAD and 3 CVAs -Recommend cessation- discussed the risks of ongoing tobacco abuse and benefits of quitting or cutting back. -Recommend Chantix to assist her efforts with quitting smoking.  We discussed the warning symptoms of bad dreams, depression, or suicidal ideations, which would be indications to immediately stop this medication.   RTC in 3 months.   Current Outpatient Medications:  .  amLODipine (NORVASC) 10 MG tablet, Take 1 tablet Daily for BP, Disp: 90 tablet, Rfl: 3 .  aspirin EC 81 MG tablet, Take 81 mg by mouth at bedtime. , Disp: , Rfl:  .  bisoprolol-hydrochlorothiazide (ZIAC) 10-6.25 MG tablet, Take 1 tablet by mouth daily., Disp: 90 tablet, Rfl: 3 .  Blood Glucose Monitoring Suppl (ACCU-CHEK AVIVA PLUS) W/DEVICE KIT, Check blood sugar up to three times daily, Disp: 1 kit, Rfl: 0 .  Cholecalciferol (VITAMIN D PO), Take 5,000 Units by mouth daily., Disp: , Rfl:  .  CINNAMON PO, Take 1,000 mg by mouth 2 (two) times daily. , Disp: , Rfl:  .  citalopram (CELEXA) 40 MG tablet, Take 1 tablet Daily for Mood & Anxiety, Disp: 90 tablet, Rfl: 3 .  clonazePAM (KLONOPIN) 0.5 MG tablet, TAKE 1/2-1 TAB AT BEDTIME IF NEEDED FOR SLEEP & TRY TO LIMIT TO 5 DAYS/WEEK TO AVOID ADDICTION, Disp: 30 tablet, Rfl: 0 .  dapagliflozin propanediol (FARXIGA) 5 MG TABS tablet, Take 5 mg by mouth daily before breakfast., Disp: 30  tablet, Rfl: 5 .  glucose blood (ACCU-CHEK AVIVA PLUS) test strip, Use daily to check BS TID Dx. E11.22, Disp: 300 each, Rfl: 1 .  insulin NPH-regular Human (NOVOLIN 70/30) (70-30) 100 UNIT/ML injection, INJECT 35 UNITS IN THE AM before FOOD, AND INJECT 30 UNITS  before the EVENING MEAL., Disp: 30 mL, Rfl: 2 .  Insulin Syringe-Needle U-100 31G X 15/64" 0.5 ML MISC, Use two pens daily with insulin, Disp: 100 each, Rfl: 3 .  Magnesium 500 MG TABS, Take 500 mg by mouth daily., Disp: , Rfl:  .  olmesartan (BENICAR) 40 MG tablet, Take 1 tablet Daily for BP, Disp: 90 tablet, Rfl: 3 .  Omega-3 Fatty Acids (FISH OIL) 1200 MG CAPS, Take 1,200 capsules by mouth daily. , Disp: , Rfl:  .  omeprazole (PRILOSEC) 40 MG capsule, TAKE 1 CAPSULE BY MOUTH EVERY DAY, Disp: 90 capsule, Rfl: 2 .  OVER THE COUNTER MEDICATION, Take 1 tablet by mouth daily. One a day vitamin Woman's one a day, Disp: , Rfl:  .  rosuvastatin (CRESTOR) 40 MG tablet, Take 1 /2 to 1 tablet Daily for Cholesterol, Disp: 90 tablet, Rfl: 1 .  vitamin E 400 UNIT capsule, Take 400 Units by mouth daily., Disp: , Rfl:  .  albuterol (VENTOLIN HFA) 108 (90 Base) MCG/ACT inhaler, Inhale 2 puffs into the lungs every 4 (four) hours as needed for wheezing or shortness of breath., Disp: 18 g, Rfl: 11 .  Tiotropium Bromide Monohydrate (SPIRIVA RESPIMAT) 2.5 MCG/ACT AERS, Inhale 1 puff into the lungs daily., Disp: 4 g, Rfl: 11 .  varenicline (CHANTIX) 0.5 MG tablet, Take 1 tablet (0.5 mg total) by mouth 2 (two) times daily., Disp: 60 tablet, Rfl: 0 .  varenicline (CHANTIX) 1 MG tablet, Take 1 tablet (1 mg total) by mouth 2 (two) times daily., Disp: 60 tablet, Rfl: 3   Julian Hy, DO [facility] 08/27/2019 10:57 AM

## 2019-08-27 NOTE — Patient Instructions (Addendum)
Thank you for visiting Dr. Carlis Abbott at [facility]. We recommend the following:   Meds ordered this encounter  Medications  . Tiotropium Bromide Monohydrate (SPIRIVA RESPIMAT) 2.5 MCG/ACT AERS    Sig: Inhale 1 puff into the lungs daily.    Dispense:  4 g    Refill:  11  . albuterol (VENTOLIN HFA) 108 (90 Base) MCG/ACT inhaler    Sig: Inhale 2 puffs into the lungs every 4 (four) hours as needed for wheezing or shortness of breath.    Dispense:  18 g    Refill:  11  . varenicline (CHANTIX) 0.5 MG tablet    Sig: Take 1 tablet (0.5 mg total) by mouth 2 (two) times daily.    Dispense:  60 tablet    Refill:  0  . varenicline (CHANTIX) 1 MG tablet    Sig: Take 1 tablet (1 mg total) by mouth 2 (two) times daily.    Dispense:  60 tablet    Refill:  3    Return in about 3 months (around 11/25/2019).    Please do your part to reduce the spread of COVID-19.   It is very important that you stop smoking or vaping. This is the single most important thing that you can do to improve your lung health.   S = Set a quit date. T = Tell family, friends, and the people around you that you plan to quit. A = Anticipate or plan ahead for the tough times you'll face while quitting. R = Remove cigarettes and other tobacco products from your home, car, and work. T = Talk to [place] about getting help to quit.  If you need help, please reach out to our office or the smoking cessation resources available: [place] Smoking Cessation Class: [phone] 1-800-QUIT-NOW www.BeTobaccoFree.gov

## 2019-09-04 ENCOUNTER — Other Ambulatory Visit: Payer: Self-pay | Admitting: Cardiothoracic Surgery

## 2019-09-04 DIAGNOSIS — R911 Solitary pulmonary nodule: Secondary | ICD-10-CM

## 2019-09-06 ENCOUNTER — Other Ambulatory Visit: Payer: Self-pay | Admitting: Physician Assistant

## 2019-09-18 ENCOUNTER — Other Ambulatory Visit: Payer: Self-pay | Admitting: Critical Care Medicine

## 2019-09-18 DIAGNOSIS — J449 Chronic obstructive pulmonary disease, unspecified: Secondary | ICD-10-CM

## 2019-09-18 DIAGNOSIS — Z720 Tobacco use: Secondary | ICD-10-CM

## 2019-09-19 NOTE — Telephone Encounter (Signed)
Yes please!  LPC

## 2019-09-19 NOTE — Telephone Encounter (Signed)
Dr. Carlis Abbott, please advise if you are okay with [place] refilling med for pt.

## 2019-09-23 ENCOUNTER — Other Ambulatory Visit: Payer: Self-pay | Admitting: Physician Assistant

## 2019-09-23 DIAGNOSIS — F411 Generalized anxiety disorder: Secondary | ICD-10-CM

## 2019-10-04 ENCOUNTER — Other Ambulatory Visit: Payer: Self-pay

## 2019-10-08 ENCOUNTER — Ambulatory Visit (INDEPENDENT_AMBULATORY_CARE_PROVIDER_SITE_OTHER): Payer: Medicare Other | Admitting: Internal Medicine

## 2019-10-08 ENCOUNTER — Other Ambulatory Visit: Payer: Self-pay

## 2019-10-08 ENCOUNTER — Encounter: Payer: Self-pay | Admitting: Internal Medicine

## 2019-10-08 VITALS — BP 150/70 | HR 56 | Ht 67.0 in | Wt 190.0 lb

## 2019-10-08 DIAGNOSIS — E782 Mixed hyperlipidemia: Secondary | ICD-10-CM | POA: Diagnosis not present

## 2019-10-08 DIAGNOSIS — E1159 Type 2 diabetes mellitus with other circulatory complications: Secondary | ICD-10-CM

## 2019-10-08 DIAGNOSIS — N1832 Chronic kidney disease, stage 3b: Secondary | ICD-10-CM | POA: Diagnosis not present

## 2019-10-08 DIAGNOSIS — E669 Obesity, unspecified: Secondary | ICD-10-CM

## 2019-10-08 DIAGNOSIS — Z794 Long term (current) use of insulin: Secondary | ICD-10-CM | POA: Diagnosis not present

## 2019-10-08 MED ORDER — FARXIGA 10 MG PO TABS: 10.0000 mg | ORAL_TABLET | Freq: Every day | ORAL | 3 refills | Status: DC

## 2019-10-08 MED ORDER — NOVOLIN 70/30 (70-30) 100 UNIT/ML ~~LOC~~ SUSP: SUBCUTANEOUS | 3 refills | Status: DC

## 2019-10-08 NOTE — Patient Instructions (Addendum)
Please  continue: - Novolog 70/30  35 units before breakfast and 30 units before dinner  Please increase: - Farxiga 10 mg daily before b'fast  Bring the Livongo meter with you at next visit.  Please stop at the lab.  Please return in 3 months with your sugar log.

## 2019-10-08 NOTE — Progress Notes (Addendum)
Patient ID: [name], female   DOB: 1946-05-01, 74 y.o.   MRN: [id number]   This visit occurred during the SARS-CoV-2 public health emergency.  Safety protocols were in place, including screening questions prior to the visit, additional usage of staff PPE, and extensive cleaning of exam room while observing appropriate contact time as indicated for disinfecting solutions.   HPI: [name] is a 74 y.o.-year-old female, initially referred by her cardiologist, Dr.Hochrein, presenting for follow-up for DM2, dx in ~2000, insulin-dependent since ~2005, uncontrolled, with complications (PVD - s/p stents and fem-pop bypass; h/o CVA x3; CKD stage 3b; DR; PN). She saw my colleague, Dr. Loanne Drilling many years ago.  Last visit with me 3 months ago. PCP: [name], PA  Reviewed HbA1c levels: Lab Results  Component Value Date   HGBA1C 9.1 (A) 07/09/2019   HGBA1C 10.9 (H) 11/13/2018   HGBA1C 9.7 (H) 07/19/2018   HGBA1C 8.7 (H) 04/18/2018   HGBA1C 8.3 (H) 01/03/2018   HGBA1C 10.0 (H) 09/27/2017   HGBA1C 10.5 (H) 05/04/2017   HGBA1C 8.9 02/25/2017   HGBA1C 10.9 (H) 11/10/2016   HGBA1C 9.3 (H) 07/22/2016   Component     Latest Ref Rng & Units 07/22/2016  Glutamic Acid Decarb Ab     <5 IU/mL <5   Current regimen: - Novolog 70/30 35 units before breakfast and 30 units before dinner - injecting hips and arms due to abdominal pain - Farxiga 5 mg in am - started 06/2019 >> tolerates it well She could not tolerate Ozempic (started 11/2018) due to nausea, vomiting, abdominal pain-stopped 04/2019. She was on Invokana >> intolerance 2/2 weight loss and dehydration >> hospitalization. She was on Metformin and sulfonylurea at diagnosis. She was also on Januvia at the same time with Invokana >> stopped along with Invokana   Pt checks her sugars ~2 a day -at this visit she did not bring her meter or a blood sugar log with her - am: 168-296 >> 149-172 >> 119-150 >> 86-138 - 2h after b'fast: 326 >>  n/c - before lunch: 265 >> 124-172 >> n/c - 2h after lunch: 280, 286 >> n/c - before dinner: 176, 244 >> 119-156 >> 166-180s >> 68-157 - 2h after dinner: 149-183, 203 >> 247 >> (late dinner) 196- 201 - bedtime: n/c - nighttime: n/c Lowest sugar was 150 >> 119 >> 119 >> 68; she has hypoglycemia awareness at 100. Highest sugar was 300s >> 203 >> 247 (cake) >> 201.  Glucometer: Livongo  Pt's meals are: - Breakfast: eggs or cheese sandwich - Lunch: half a sandwich, apple - Dinner 9-9:30 pm: chicken, veggies - Snacks: stopped cheese; now apples and pickles She works part-time, from 10 am - 2 pm Tue-Fri. She now works from home.  -+ CKD stage IIIb, last BUN/creatinine:  Lab Results  Component Value Date   BUN 16 11/13/2018   BUN 17 08/28/2018   CREATININE 1.59 (H) 11/13/2018   CREATININE 1.44 (H) 08/28/2018  On olmesartan 40.  -+ HL; last set of lipids: Lab Results  Component Value Date   CHOL 134 11/13/2018   HDL 40 (L) 11/13/2018   LDLCALC 73 11/13/2018   LDLDIRECT 146.4 01/30/2010   TRIG 131 11/13/2018   CHOLHDL 3.4 11/13/2018  On Crestor 40, omega-3 fatty acids 1200 mg daily.  - last eye exam was in 10/2017: + DR, + glaucoma  -+ Numbness and tingling in her feet.  She has a callus on the left foot.  She sees  a podiatrist.  She has been on Lyrica but had intolerance.  On ASA 81.  Pt has FH of DM in sister.  She has a history of nontoxic multinodular goiter.  Latest TSH was reviewed and this was normal: Lab Results  Component Value Date   TSH 1.65 11/13/2018   She also has a history of HTN, brain aneurysm, emphysema, esophageal stricture, GERD.  She had hernia repair surgery- had mesh placed >> persistent abdominal pain.  ROS: Constitutional: no weight gain/no weight loss, no fatigue, no subjective hyperthermia, no subjective hypothermia Eyes: no blurry vision, no xerophthalmia ENT: no sore throat, no nodules palpated in neck, no dysphagia, no odynophagia, no  hoarseness Cardiovascular: no CP/no SOB/no palpitations/no leg swelling Respiratory: no cough/no SOB/no wheezing Gastrointestinal: no N/no V/no D/no C/no acid reflux Musculoskeletal: no muscle aches/no joint aches Skin: no rashes, no hair loss Neurological: no tremors/no numbness/no tingling/no dizziness  I reviewed pt's medications, allergies, PMH, social hx, family hx, and changes were documented in the history of present illness. Otherwise, unchanged from my initial visit note.  Past Medical History:  Diagnosis Date  . Abnormal findings on esophagogastroduodenoscopy (EGD) 07/2010  . Aneurysm [facility]) 2013   Right Brain   . Aneurysm ([place])    in brain x 2  . Anxiety   . Colon polyps   . Diabetes mellitus 1998  . Diverticulosis   . ESOPHAGEAL STRICTURE 08/27/2008  . GERD (gastroesophageal reflux disease)   . Hemorrhoids   . Hiatal hernia   . Hyperlipidemia   . Hypertension 2000  . Migraines   . Peripheral vascular disease ([place])   . Phlebitis    30 years ago  left leg  . Stroke [facility])    multiple mini strokes ( brain aneurysm )   Past Surgical History:  Procedure Laterality Date  . ABDOMINAL AORTAGRAM N/A 01/04/2012   Procedure: ABDOMINAL Maxcine Ham;  Surgeon: Serafina Mitchell, MD;  Location: [facility] CATH LAB;  Service: Cardiovascular;  Laterality: N/A;  . ABDOMINAL AORTAGRAM N/A 08/15/2012   Procedure: ABDOMINAL Maxcine Ham;  Surgeon: Serafina Mitchell, MD;  Location: [facility] CATH LAB;  Service: Cardiovascular;  Laterality: N/A;  . ABDOMINAL HYSTERECTOMY  1984  . cataract surgery Right 10-22-2015  . cataract surgery Left 11-05-2015  . CESAREAN SECTION    . CHOLECYSTECTOMY    . COLONOSCOPY  07/2010  . DENTAL SURGERY  Aug. 16, 2013   left lower   . ESOPHAGOGASTRODUODENOSCOPY  07/2010  . EYE SURGERY  Nov. 2014   Laser-Glaucoma  . FEMORAL ARTERY STENT  05/11/11   Left superficial femoral and popliteal artery  . HERNIA REPAIR     times two  . KNEE SURGERY    . LOWER EXTREMITY ANGIOGRAM  Left 08/15/2012   Procedure: LOWER EXTREMITY ANGIOGRAM;  Surgeon: Serafina Mitchell, MD;  Location: [facility] CATH LAB;  Service: Cardiovascular;  Laterality: Left;  lt leg angio  . rotator cuff surgery    . THYROID SURGERY     Social History   Socioeconomic History  . Marital status: Married    Spouse name: Not on file  . Number of children: 2  . Years of education: Not on file  . Highest education level: Not on file  Occupational History  . 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 Smoking status: Current Every Day Smoker    Packs/day: 0.25    Years: 40.00    Pack years: 10.00    Types: Cigarettes  . Smokeless  tobacco: Never Used  Substance and Sexual Activity  . Alcohol use: No    Alcohol/week: 0.0 standard drinks  . Drug use: No  . Sexual activity: Not on file  Other Topics Concern  . Not on file  Social History Narrative   Lives with husband.        Social Determinants of Health   Financial Resource Strain:   . Difficulty of Paying Living Expenses: Not on file  Food Insecurity:   . Worried About Charity fundraiser in the Last Year: Not on file  . Ran Out of Food in the Last Year: Not on file  Transportation Needs:   . Lack of Transportation (Medical): Not on file  . Lack of Transportation (Non-Medical): Not on file  Physical Activity:   . Days of Exercise per Week: Not on file  . Minutes of Exercise per Session: Not on file  Stress:   . Feeling of Stress : Not on file  Social Connections:   . Frequency of Communication with Friends and Family: Not on file  . Frequency of Social Gatherings with Friends and Family: Not on file  . Attends Religious Services: Not on file  . Active Member of Clubs or Organizations: Not on file  . Attends Archivist Meetings: Not on file  . Marital Status: Not on file  Intimate Partner Violence:   . Fear of Current or Ex-Partner: Not on file  . Emotionally Abused: Not on file  . Physically Abused:  Not on file  . Sexually Abused: Not on file   Current Outpatient Medications on File Prior to Visit  Medication Sig Dispense Refill  . CHANTIX 0.5 MG tablet TAKE 1 TABLET BY MOUTH 2 TIMES DAILY. 60 tablet 0  . albuterol (VENTOLIN HFA) 108 (90 Base) MCG/ACT inhaler Inhale 2 puffs into the lungs every 4 (four) hours as needed for wheezing or shortness of breath. 18 g 11  . amLODipine (NORVASC) 10 MG tablet Take 1 tablet Daily for BP 90 tablet 3  . aspirin EC 81 MG tablet Take 81 mg by mouth at bedtime.     . bisoprolol-hydrochlorothiazide (ZIAC) 10-6.25 MG tablet Take 1 tablet by mouth daily. 90 tablet 3  . Blood Glucose Monitoring Suppl (ACCU-CHEK AVIVA PLUS) W/DEVICE KIT Check blood sugar up to three times daily 1 kit 0  . Cholecalciferol (VITAMIN D PO) Take 5,000 Units by mouth daily.    Marland Kitchen CINNAMON PO Take 1,000 mg by mouth 2 (two) times daily.     . citalopram (CELEXA) 40 MG tablet Take 1 tablet Daily for Mood & Anxiety 90 tablet 3  . clonazePAM (KLONOPIN) 0.5 MG tablet TAKE 1/2-1 TAB AT BEDTIME IF NEEDED FOR SLEEP & TRY TO LIMIT TO 5 DAYS/WEEK TO AVOID ADDICTION 30 tablet 0  . dapagliflozin propanediol (FARXIGA) 5 MG TABS tablet Take 5 mg by mouth daily before breakfast. 30 tablet 5  . glucose blood (ACCU-CHEK AVIVA PLUS) test strip Use daily to check BS TID Dx. Z56.38 300 each 1  . insulin NPH-regular Human (NOVOLIN 70/30) (70-30) 100 UNIT/ML injection INJECT 35 UNITS IN THE AM before FOOD, AND INJECT 30 UNITS before the EVENING MEAL. 30 mL 2  . Insulin Syringe-Needle U-100 31G X 15/64" 0.5 ML MISC Use two pens daily with insulin 100 each 3  . Magnesium 500 MG TABS Take 500 mg by mouth daily.    Marland Kitchen olmesartan (BENICAR) 40 MG tablet Take 1 tablet Daily for BP 90 tablet 3  .  Omega-3 Fatty Acids (FISH OIL) 1200 MG CAPS Take 1,200 capsules by mouth daily.     Marland Kitchen omeprazole (PRILOSEC) 40 MG capsule TAKE 1 CAPSULE BY MOUTH EVERY DAY 90 capsule 2  . OVER THE COUNTER MEDICATION Take 1 tablet by  mouth daily. One a day vitamin Woman's one a day    . rosuvastatin (CRESTOR) 40 MG tablet Take 1 /2 to 1 tablet Daily for Cholesterol 90 tablet 1  . Tiotropium Bromide Monohydrate (SPIRIVA RESPIMAT) 2.5 MCG/ACT AERS Inhale 1 puff into the lungs daily. 4 g 11  . varenicline (CHANTIX) 1 MG tablet Take 1 tablet (1 mg total) by mouth 2 (two) times daily. 60 tablet 3  . vitamin E 400 UNIT capsule Take 400 Units by mouth daily.     No current facility-administered medications on file prior to visit.   Allergies  Allergen Reactions  . Ciprofloxacin Other (See Comments)  . Ozempic (0.25 Or 0.5 Mg-Dose) [Semaglutide(0.25 Or 0.6m-Dos)] Nausea And Vomiting  . Plavix [Clopidogrel Bisulfate] Palpitations  . Amoxicillin Itching and Swelling    FACE & EYES SWELL  . Ace Inhibitors   . Ciprofloxacin Hcl Swelling  . Lyrica [Pregabalin]     Itch, gain weight  . Penicillins Other (See Comments)    REACTION: unspecified   Family History  Problem Relation Age of Onset  . CAD Mother 648      Died of MI  . Hypertension Mother   . Heart attack Mother   . Heart disease Mother   . CAD Father 654      Died of MI  . Heart disease Father   . Heart attack Father   . CAD Brother 658      Two brothers died of MI  . Heart attack Brother   . Heart disease Brother        Amputation  . Diabetes Sister   . Hypertension Sister   . Heart attack Brother   . Heart disease Brother   . Stroke Sister   . Colon cancer Neg Hx   . Stomach cancer Neg Hx   . Esophageal cancer Neg Hx     PE: BP (!) 150/70   Pulse (!) 56   Ht _0  (1.702 m)   Wt 190 lb (86.2 kg)   SpO2 94%   BMI 29.76 kg/m  Wt Readings from Last 3 Encounters:  10/08/19 190 lb (86.2 kg)  08/27/19 187 lb 6.4 oz (85 kg)  08/06/19 186 lb (84.4 kg)   Constitutional: overweight, in NAD Eyes: PERRLA, EOMI, no exophthalmos ENT: moist mucous membranes, no thyromegaly, no cervical lymphadenopathy Cardiovascular: RRR, No MRG Respiratory: CTA  B Gastrointestinal: abdomen soft, NT, ND, BS+ Musculoskeletal: no deformities, strength intact in all 4 Skin: moist, warm, no rashes Neurological: no tremor with outstretched hands, DTR normal in all 4  ASSESSMENT: 1. DM2, insulin-dependent, uncontrolled, with complications - PVD - s/p stents and fem-pop bypass - h/o CVA - CKD stage 3b - DR - PN  2.  Hyperlipidemia  3.  Obesity class 1  PLAN:  1. Patient with longstanding, uncontrolled, type 2 diabetes, on premixed insulin regimen and now SGLT2 inhibitor, low-dose.  She is on FIran  I did not recommend Invokana due to severe PVD.  We tried to add a GLP-1 receptor agonist but she had GI side effects from it. -We discussed in the past about improving her diet and reduce her insulin resistance, as her diet was mostly high fat  and high salt.  I also referred her to nutrition, at last visit. -At last visit, sugars are higher than goal especially in the second half of the day but she was not checking consistently and also not checking sugars at that time.  I strongly advised her to start.  We increased her premixed insulin at that time and added low-dose Farxiga, 5 mg daily.  She does have a history of UTIs so we have to be careful with this medication.  I advised her to stay very well-hydrated.  She tells me that she is tolerating this well. -At last visit we also discussed about the possibility of a VGo pump and I explained how this works and given her a Programmer, systems.  She did not decide for this yet.  At this visit, she tells me that she reviewed the materials that I gave her at last visit and she does not think that she could use this. -At this visit, reviewing her sugars at home, they appear to be better than expected from her HbA1c today: HbA1c: 9.1% (stable).  Therefore, I will also check a fructosamine level today.  Based on her sugars at home, her HbA1c should not be higher than approximately 7.5 to 7.8%.  At this visit, the only change I  suggested to increase the [place], since she is tolerating this well, however, depending on the HbA1c calculated from fructosamine, we may need to change her insulin regimen from a premixed one to basal-bolus one. - I suggested to:  Patient Instructions  Please  continue: - Novolog 70/30  35 units before breakfast and 30 units before dinner  Please increase: - Farxiga 10 mg daily before b'fast  Bring the Livongo meter with you at next visit.  Please stop at the lab.  Please return in 3 months with your sugar log.   - advised to check sugars at different times of the day - 2x a day, rotating check times - advised for yearly eye exams >> she is not UTD - We will check annual labs today - return to clinic in 3 months   2. HL -Regulated lipid panel from 10/2018: LDL at goal, HDL slightly low, triglycerides at goal Lab Results  Component Value Date   CHOL 134 11/13/2018   HDL 40 (L) 11/13/2018   LDLCALC 73 11/13/2018   LDLDIRECT 146.4 01/30/2010   TRIG 131 11/13/2018   CHOLHDL 3.4 11/13/2018  -Continue rosuvastatin without side effects.  Also on omega-3 fatty acids. -We will recheck her lipid panel today  3.  Obesity class 1 -We unfortunately had to stop Ozempic due to intolerance. -I advised her to add Farxiga at last visit.  She is tolerating it well and we are increasing the dose today.  This should also help with weight loss -Before last visit, she gained 7 pounds, and 2 pounds since last visit  Component     Latest Ref Rng & Units 10/08/2019          Glucose     65 - 99 mg/dL 171 (H)  BUN     7 - 25 mg/dL 33 (H)  Creatinine     0.60 - 0.93 mg/dL 1.85 (H)  GFR, Est Non African American     > OR = 60 mL/min/1.17m 27 (L)  GFR, Est African American     > OR = 60 mL/min/1.767m31 (L)  BUN/Creatinine Ratio     6 - 22 (calc) 18  Sodium     135 -  146 mmol/L 140  Potassium     3.5 - 5.3 mmol/L 4.4  Chloride     98 - 110 mmol/L 105  CO2     20 - 32 mmol/L 27   Calcium     8.6 - 10.4 mg/dL 9.5  Total Protein     6.1 - 8.1 g/dL 6.6  Albumin MSPROF     3.6 - 5.1 g/dL 3.7  Globulin     1.9 - 3.7 g/dL (calc) 2.9  AG Ratio     1.0 - 2.5 (calc) 1.3  Total Bilirubin     0.2 - 1.2 mg/dL 0.5  Alkaline phosphatase (APISO)     37 - 153 U/L 85  AST     10 - 35 U/L 19  ALT     6 - 29 U/L 14  Cholesterol     0 - 200 mg/dL 132  Triglycerides     0.0 - 149.0 mg/dL 109.0  HDL Cholesterol     >39.00 mg/dL 44.30  VLDL     0.0 - 40.0 mg/dL 21.8  LDL (calc)     0 - 99 mg/dL 66  Total CHOL/HDL Ratio      3  NonHDL      88.18  Microalb, Ur     0.0 - 1.9 mg/dL 101.7 (H)  Creatinine,U     mg/dL 72.4  MICROALB/CREAT RATIO     0.0 - 30.0 mg/g 140.5 (H)  Fructosamine     205 - 285 umol/L 311 (H)   Kidney fxn is worse >> will increase hydration and have her back for a new BMP in 1 week. ACR also high, but MUCH improved.  HbA1c calculated from fructosamine is 6.9%, which is more concordant with her sugars at home.  Component     Latest Ref Rng & Units 10/18/2019          Glucose     65 - 99 mg/dL 314 (H)  BUN     7 - 25 mg/dL 22  Creatinine     0.60 - 0.93 mg/dL 1.87 (H)  GFR, Est Non African American     > OR = 60 mL/min/1.72m 26 (L)  GFR, Est African American     > OR = 60 mL/min/1.791m31 (L)  BUN/Creatinine Ratio     6 - 22 (calc) 12  Sodium     135 - 146 mmol/L 140  Potassium     3.5 - 5.3 mmol/L 4.1  Chloride     98 - 110 mmol/L 105  CO2     20 - 32 mmol/L 28  Calcium     8.6 - 10.4 mg/dL 8.8   Her kidney function did not improve, but it is stable, despite increasing the dose of Fa[place] For now, I would suggest to continue the same dose and probably repeat the kidney function at her appointment with PCP in approximately 1 month.  CrPhilemon KingdomMD PhD LeSoutheast [facility] Centerndocrinology

## 2019-10-09 ENCOUNTER — Other Ambulatory Visit: Payer: Self-pay | Admitting: Critical Care Medicine

## 2019-10-09 DIAGNOSIS — J449 Chronic obstructive pulmonary disease, unspecified: Secondary | ICD-10-CM

## 2019-10-09 DIAGNOSIS — Z720 Tobacco use: Secondary | ICD-10-CM

## 2019-10-09 LAB — MICROALBUMIN / CREATININE URINE RATIO
Creatinine,U: 72.4 mg/dL
Microalb Creat Ratio: 140.5 mg/g — ABNORMAL HIGH (ref 0.0–30.0)
Microalb, Ur: 101.7 mg/dL — ABNORMAL HIGH (ref 0.0–1.9)

## 2019-10-09 LAB — LIPID PANEL
Cholesterol: 132 mg/dL (ref 0–200)
HDL: 44.3 mg/dL (ref >39.00)
LDL Cholesterol: 66 mg/dL (ref 0–99)
NonHDL: 88.18
Total CHOL/HDL Ratio: 3
Triglycerides: 109 mg/dL (ref 0.0–149.0)
VLDL: 21.8 mg/dL (ref 0.0–40.0)

## 2019-10-11 ENCOUNTER — Telehealth: Payer: Self-pay | Admitting: *Deleted

## 2019-10-11 NOTE — Telephone Encounter (Signed)
Per Dr [name], she is OK to take the Covid 19 vaccine.  Patient is aware.

## 2019-10-14 LAB — COMPLETE METABOLIC PANEL WITH GFR
AG Ratio: 1.3 (calc) (ref 1.0–2.5)
ALT: 14 U/L (ref 6–29)
AST: 19 U/L (ref 10–35)
Albumin: 3.7 g/dL (ref 3.6–5.1)
Alkaline phosphatase (APISO): 85 U/L (ref 37–153)
BUN/Creatinine Ratio: 18 (calc) (ref 6–22)
BUN: 33 mg/dL — ABNORMAL HIGH (ref 7–25)
CO2: 27 mmol/L (ref 20–32)
Calcium: 9.5 mg/dL (ref 8.6–10.4)
Chloride: 105 mmol/L (ref 98–110)
Creat: 1.85 mg/dL — ABNORMAL HIGH (ref 0.60–0.93)
GFR, Est African American: 31 mL/min/{1.73_m2} — ABNORMAL LOW (ref >=60)
GFR, Est Non African American: 27 mL/min/{1.73_m2} — ABNORMAL LOW (ref >=60)
Globulin: 2.9 g/dL (calc) (ref 1.9–3.7)
Glucose, Bld: 171 mg/dL — ABNORMAL HIGH (ref 65–99)
Potassium: 4.4 mmol/L (ref 3.5–5.3)
Sodium: 140 mmol/L (ref 135–146)
Total Bilirubin: 0.5 mg/dL (ref 0.2–1.2)
Total Protein: 6.6 g/dL (ref 6.1–8.1)

## 2019-10-14 LAB — FRUCTOSAMINE: Fructosamine: 311 umol/L — ABNORMAL HIGH (ref 205–285)

## 2019-10-17 ENCOUNTER — Other Ambulatory Visit: Payer: Medicare Other

## 2019-10-18 ENCOUNTER — Other Ambulatory Visit: Payer: Self-pay

## 2019-10-18 ENCOUNTER — Other Ambulatory Visit (INDEPENDENT_AMBULATORY_CARE_PROVIDER_SITE_OTHER): Payer: Medicare Other

## 2019-10-18 DIAGNOSIS — H538 Other visual disturbances: Secondary | ICD-10-CM | POA: Diagnosis not present

## 2019-10-18 DIAGNOSIS — N1832 Chronic kidney disease, stage 3b: Secondary | ICD-10-CM | POA: Diagnosis not present

## 2019-10-18 DIAGNOSIS — H0520 Unspecified exophthalmos: Secondary | ICD-10-CM | POA: Diagnosis not present

## 2019-10-18 LAB — BASIC METABOLIC PANEL WITH GFR
BUN/Creatinine Ratio: 12 (calc) (ref 6–22)
BUN: 22 mg/dL (ref 7–25)
CO2: 28 mmol/L (ref 20–32)
Calcium: 8.8 mg/dL (ref 8.6–10.4)
Chloride: 105 mmol/L (ref 98–110)
Creat: 1.87 mg/dL — ABNORMAL HIGH (ref 0.60–0.93)
GFR, Est African American: 31 mL/min/{1.73_m2} — ABNORMAL LOW (ref >=60)
GFR, Est Non African American: 26 mL/min/{1.73_m2} — ABNORMAL LOW (ref >=60)
Glucose, Bld: 314 mg/dL — ABNORMAL HIGH (ref 65–99)
Potassium: 4.1 mmol/L (ref 3.5–5.3)
Sodium: 140 mmol/L (ref 135–146)

## 2019-10-21 ENCOUNTER — Other Ambulatory Visit: Payer: Self-pay | Admitting: Internal Medicine

## 2019-10-25 ENCOUNTER — Other Ambulatory Visit: Payer: Self-pay | Admitting: Physician Assistant

## 2019-10-25 DIAGNOSIS — F411 Generalized anxiety disorder: Secondary | ICD-10-CM

## 2019-10-29 ENCOUNTER — Encounter: Payer: Self-pay | Admitting: *Deleted

## 2019-10-29 DIAGNOSIS — Z006 Encounter for examination for normal comparison and control in clinical research program: Secondary | ICD-10-CM

## 2019-10-29 NOTE — Research (Signed)
Marland KitchenMarland KitchenMarland KitchenMarland KitchenMarland KitchenMarland KitchenMarland Kitchen   COORDINATE-Diabetes 9 Month Patient Questionnaire (Intervention) Site #:   409   Patient ID:     [id number]              3 GDJME QUESTIONNAIRE  Visit Date 10/29/19   Vital Status [x]   Patient Alive > Proceed to Visit Status []   Patient Dead > Complete Death Form only []   Unknown > Proceed to Visit Status  Visit Status Was the interview completed? []  No >IF NO, Select reason why [] Unable to locate                                    [] No Valid Contacts (patients or alternates) [] Multiple attempts to valid contacts []  Patient no longer cared for at study clinic []  Patient withdrew []  Other, specify:                                           >IF NO, Last date of contact: / /             MM      DD        YYYY    [] Yes >IF YES, Select source of Interview: []   Proxy []   Patient     3 Month Patient Questionnaire (Intervention)  Instructions:   1. Prior to speaking with the patient either over the phone or in person, print off or have available the patient's current list of medications.      IF conducting follow-up visit over the phone - Instruct the patient to gather all their pill bottles. 2. For the purpose of the COORDINATE-Diabetes Study, please document whether the patient is currently taking each of the following medication classes. 3. DO NOT PROMPT DURING FOLLOW-UP VISIT: IF subject mentions reaction to one of the following medications, complete the AE/SAE section and follow instructions in operations manual regarding Adverse event reporting to [employer] ([facility]).  MEDICATIONS  Medication Are you currently taking [insert name of previous med]? If currently taking: If started since last visit: If stopped since last visit:  ACE Inhibitor / Angiotensin Receptor Blocker (ARB) / Angiotensin Receptor Neprilysin inhibitor (ARNi) [] No >  [x] Yes > [] Stopped since last visit [] Never prescribed [] Started since last visit [x] Same medication as last     visit [] Different  medication than       last visit [Study personnel to answer]  Documented in EHR? [] No  [x] Yes   If no, Verified by: [] Photo of bottle [] Copy of rx [] Dispensing       pharmacy [] Prescribing provider [] Other (specify):                    Date started:        / /             MM DD YYYY Who prescribed this medication for you? [] Cardiology provider > [] Study clinic [] Outside clinic [] Endocrinology provider [] Primary care provider [] Other provider > Specify:                             [] Unknown Date discontinued:        / /             MM DD YYYY Why did you stop taking this medication? (  check all that apply) [] Allergic reaction [] Medication side effects [] Unable to       adhere/monitor [] Had an      operation/procedure      that required      stopping it [] Unable to afford it [] No longer wants        to take        this medication [] Provider decision [] Pregnancy [] Other (specify: ) [] Unknown Reason   If started or changed  What medication are you taking now? [] Benazepril (Lotensin) [] Captopril (Capoten) [] Enalapril (Vasotec) [] Fosinopril (Monopril) [] Lisinopril (Zestril, Prinivil) [] Quinapril (Accupril) [] Ramipril (Altace) [] Azilsartan (Edarbi) [] Candesartan (Atacand) [] Irbesartan (Avapro) [] Losartan (Cozaar) [x] Olmesartan (Benicar) [] Telmisartan (Micardis) [] Valsartan (Diovan) [] Sacrubitril/Valsartan Delene Loll)      Medication Are you currently taking [insert name of previous med]? If currently taking: If started since last visit: If stopped since last visit:  Statin []  No >  [x] Yes > [] Stopped since last visit [] Never prescribed [] Started since last visit [x] Same medication as last        visit [] Different medication or dose     than last visit [Study personnel to answer]  Documented in EHR? [] No [x] Yes    If no, Verified by: [] Photo of bottle [] Copy of rx [] Dispensing pharmacy [] Prescribing provider [] Other (specify):                     Date started:        / /             MM DD YYYY Who prescribed this medication for you? [] Cardiology provider  [] Study clinic [] Outside clinic [] Endocrinology provider [] Primary care provider [] Other provider  Specify:                             [] Unknown Date discontinued:        / /             MM DD YYYY Why did you stop taking this medication? (check all that apply) [] Allergic reaction [] Medication side effects  [] Muscle aches [] Weakness [] Joint pain [] Cognitive symptoms [] Other (specify):                          [] Unable to        adhere/monitor [] Had an    operation/procedure    that required stopping it [] Unable to afford it [] No longer wants       to take this            medication [] Provider decision [] Pregnancy [] Other (specify: ) [] Unknown Reason   If started or changed  What medication are you taking now? [] Atorvastatin (Lipitor) [] Fluvastatin (Lescol) [] Lovastatin (Mevacor) [] Pravastatin (Pravachol) [x] Rosuvastatin (Crestor) [] Simvastatin (Zocor) [] Pitatavastatin (Livalo)  Dose: [] 1 mg  [] 10 mg [] 2 mg  []  20 mg [] 3 mg  []  40 mg [] 4 mg  []  60 mg [] 5 mg  []  80 mg  Frequency: [] Daily []  Less than daily      Medication Are you currently taking [insert name of previous med]? If currently taking: If started since last visit: If stopped since last visit:  SGLT2 Inhibitor [] No   [x] Yes [] Stopped since last visit [] Never prescribed [] Started since last visit [x] Same medication as last      visit [] Different medication than last visit [Study personnel to answer]  Documented in EHR? [] No [x] Yes   If no, Verified by: [] Photo of bottle [] Copy of rx [] Dispensing  pharmacy [] Prescribing       provider [] Other (specify):                    Date started:        / /             MM DD YYYY Who prescribed this medication for you? [] Cardiology provider  [] Study clinic [] Outside clinic [] Endocrinology  provider [] Primary care provider [] Other provider  Specify:                             [] Unknown Date discontinued:        / /             MM DD YYYY Why did you stop taking this medication? (check all that apply) [] Allergic reaction [] Medication side effects [] Unable to       adhere/monitor [] Had an        operation/procedure         that required           stopping it [] Patient unable to       afford it [] Patient no longer       wants to       take this medication [] Provider decision [] Pregnancy [] Other (specify: ) [] Unknown Reason   If started or changed  What medication are you taking now? [] Canaglifozin (Invokana) [x] Dapagliflozin (Farxiga) [] Empaglifozin (Jardiance) [] Ertugliflozin (Steglatro)     GLP1 Receptor Agonist [x] No   [] Yes [] Stopped since last visit [] Never prescribed [] Started since last visit [] Same medication as last      visit [] Different medication than       last visit [Study personnel to answer]  Documented in EHR? [] No [] Yes   If no, Verified by: [] Photo of bottle [] Copy of rx [] Dispensing       pharmacy [] Prescribing       provider [] Other (specify):                    Date started:        / /             MM DD YYYY Who prescribed this medication for you? [] Cardiology provider  [] Study clinic [] Outside clinic [] Endocrinology provider [] Primary care provider [] Other provider  Specify:                             [] Unknown Date discontinued:        / /             MM DD YYYY Why did you stop taking this medication? (check all that apply) [] Allergic reaction [] Medication side effects [] Unable to      adhere/monitor [] Had an    operation/procedure    that required stopping it [] Patient unable to        afford it [] Patient no longer        wants to take this        medication [] Provider decision [] Pregnancy [] Other (specify: ) [] Unknown Reason   If started or changed  What medication are you taking  now? [] Albiglutide (Tanzeum) [] Dulaglutide (Trulicity) [] Exanatide (Byetta, Bydureon) [] Liraglutide (Victoza, Saxenda) [] Lixisenatide (Adlyxin) [] Semaglutice (Ozempic)     11/28/2018 ([facility] Release)

## 2019-11-01 ENCOUNTER — Other Ambulatory Visit: Payer: Self-pay | Admitting: Surgery

## 2019-11-01 DIAGNOSIS — H0520 Unspecified exophthalmos: Secondary | ICD-10-CM

## 2019-11-08 ENCOUNTER — Other Ambulatory Visit: Payer: Medicare Other

## 2019-11-08 ENCOUNTER — Ambulatory Visit: Payer: Medicare Other | Admitting: Cardiothoracic Surgery

## 2019-11-10 DIAGNOSIS — E785 Hyperlipidemia, unspecified: Secondary | ICD-10-CM | POA: Insufficient documentation

## 2019-11-10 DIAGNOSIS — E1169 Type 2 diabetes mellitus with other specified complication: Secondary | ICD-10-CM | POA: Insufficient documentation

## 2019-11-10 DIAGNOSIS — Z7189 Other specified counseling: Secondary | ICD-10-CM | POA: Insufficient documentation

## 2019-11-10 NOTE — Progress Notes (Deleted)
Cardiology Office Note   Date:  11/10/2019   ID:  [name], [name] 06-29-1946, MRN [id number]  PCP:  [name], MD  Cardiologist:   Minus Breeding, MD   No chief complaint on file.     History of Present Illness: [name] is a 74 y.o. female who presents for follow up of PVD.  She has had  peripheral vascular disease as below with stenting.  She is not had any prior cardiac history.  She has been noted in the past on CTs to follow-up pulmonary nodules to have aortic atherosclerosis and coronary artery atherosclerosis.  She had a stress test many years ago.  Echo in 2018 demonstrated NL systolic function.   In March 2020 she had negative Lexiscan Myoview.  She is being followed by Dr. Servando Snare for a lung mass.   This was stable on recent imaging and is being followed.     When I last saw her she was complaining of dyspnea which as been chronic. She had obstructive disease on PFTs.  Since I saw her she has seen pulmonary.  I reviewed this note for this visit.    ***  Sh***e continues to have significant dyspnea with exertion.  She wonders if this could be secondary to her PVD.  The patient denies any new symptoms such as chest discomfort, neck or arm discomfort. There has been no new PND or orthopnea. There have been no reported palpitations, presyncope or syncope.    Past Medical History:  Diagnosis Date  . Abnormal findings on esophagogastroduodenoscopy (EGD) 07/2010  . 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 Anxiety   . Colon polyps   . Diabetes mellitus 1998  . Diverticulosis   . ESOPHAGEAL STRICTURE 08/27/2008  . GERD (gastroesophageal reflux disease)   . Hemorrhoids   . Hiatal hernia   . Hyperlipidemia   . Hypertension 2000  . Migraines   . Peripheral vascular disease ([place])   . Phlebitis    30 years ago  left leg  . Stroke [facility])    multiple mini strokes ( brain aneurysm )    Past Surgical History:  Procedure  Laterality Date  . ABDOMINAL AORTAGRAM N/A 01/04/2012   Procedure: ABDOMINAL Maxcine Ham;  Surgeon: Serafina Mitchell, MD;  Location: [facility] CATH LAB;  Service: Cardiovascular;  Laterality: N/A;  . ABDOMINAL AORTAGRAM N/A 08/15/2012   Procedure: ABDOMINAL Maxcine Ham;  Surgeon: Serafina Mitchell, MD;  Location: [facility] CATH LAB;  Service: Cardiovascular;  Laterality: N/A;  . ABDOMINAL HYSTERECTOMY  1984  . cataract surgery Right 10-22-2015  . cataract surgery Left 11-05-2015  . CESAREAN SECTION    . CHOLECYSTECTOMY    . COLONOSCOPY  07/2010  . DENTAL SURGERY  Aug. 16, 2013   left lower   . ESOPHAGOGASTRODUODENOSCOPY  07/2010  . EYE SURGERY  Nov. 2014   Laser-Glaucoma  . FEMORAL ARTERY STENT  05/11/11   Left superficial femoral and popliteal artery  . HERNIA REPAIR     times two  . KNEE SURGERY    . LOWER EXTREMITY ANGIOGRAM Left 08/15/2012   Procedure: LOWER EXTREMITY ANGIOGRAM;  Surgeon: Serafina Mitchell, MD;  Location: [facility] CATH LAB;  Service: Cardiovascular;  Laterality: Left;  lt leg angio  . rotator cuff surgery    . THYROID SURGERY       Current Outpatient Medications  Medication Sig Dispense Refill  . albuterol (VENTOLIN  HFA) 108 (90 Base) MCG/ACT inhaler Inhale 2 puffs into the lungs every 4 (four) hours as needed for wheezing or shortness of breath. 18 g 11  . amLODipine (NORVASC) 10 MG tablet Take 1 tablet Daily for BP 90 tablet 3  . aspirin EC 81 MG tablet Take 81 mg by mouth at bedtime.     . bisoprolol-hydrochlorothiazide (ZIAC) 10-6.25 MG tablet Take 1 tablet by mouth daily. 90 tablet 3  . Blood Glucose Monitoring Suppl (ACCU-CHEK AVIVA PLUS) W/DEVICE KIT Check blood sugar up to three times daily 1 kit 0  . CHANTIX 0.5 MG tablet TAKE 1 TABLET BY MOUTH 2 TIMES DAILY. 180 tablet 0  . Cholecalciferol (VITAMIN D PO) Take 5,000 Units by mouth daily.    Marland Kitchen CINNAMON PO Take 1,000 mg by mouth 2 (two) times daily.     . citalopram (CELEXA) 40 MG tablet Take 1 tablet Daily for Mood & Anxiety 90  tablet 3  . clonazePAM (KLONOPIN) 0.5 MG tablet TAKE 1/2-1 TAB AT BEDTIME IF NEEDED FOR SLEEP & TRY TO LIMIT TO 5 DAYS/WEEK TO AVOID ADDICTION 30 tablet 0  . dapagliflozin propanediol (FARXIGA) 10 MG TABS tablet Take 10 mg by mouth daily before breakfast. 90 tablet 3  . glucose blood (ACCU-CHEK AVIVA PLUS) test strip Use daily to check BS TID Dx. E11.22 300 each 1  . insulin NPH-regular Human (NOVOLIN 70/30) (70-30) 100 UNIT/ML injection INJECT 35 UNITSUNDER THE SKIN IN THE MORNING BEFORE FOOD, AND INJECT 30 UNITS BEFORE THE EVENING MEAL. 60 mL 1  . Insulin Syringe-Needle U-100 31G X 15/64" 0.5 ML MISC Use two pens daily with insulin 100 each 3  . Magnesium 500 MG TABS Take 500 mg by mouth daily.    Marland Kitchen olmesartan (BENICAR) 40 MG tablet Take 1 tablet Daily for BP 90 tablet 3  . Omega-3 Fatty Acids (FISH OIL) 1200 MG CAPS Take 1,200 capsules by mouth daily.     Marland Kitchen omeprazole (PRILOSEC) 40 MG capsule TAKE 1 CAPSULE BY MOUTH EVERY DAY 90 capsule 2  . OVER THE COUNTER MEDICATION Take 1 tablet by mouth daily. One a day vitamin Woman's one a day    . rosuvastatin (CRESTOR) 40 MG tablet Take 1 /2 to 1 tablet Daily for Cholesterol 90 tablet 1  . Tiotropium Bromide Monohydrate (SPIRIVA RESPIMAT) 2.5 MCG/ACT AERS Inhale 1 puff into the lungs daily. 4 g 11  . varenicline (CHANTIX) 1 MG tablet Take 1 tablet (1 mg total) by mouth 2 (two) times daily. 60 tablet 3  . vitamin E 400 UNIT capsule Take 400 Units by mouth daily.     No current facility-administered medications for this visit.    Allergies:   Ciprofloxacin, Ozempic (0.25 or 0.5 mg-dose) [semaglutide(0.25 or 0.50m-dos)], Plavix [clopidogrel bisulfate], Amoxicillin, Ace inhibitors, Ciprofloxacin hcl, Lyrica [pregabalin], and Penicillins    ROS:  Please see the history of present illness.   Otherwise, review of systems are positive for ***.   All other systems are reviewed and negative.    PHYSICAL EXAM:bil VS:  There were no vitals taken for this  visit. , BMI There is no height or weight on file to calculate BMI. GENERAL:  Well appearing NECK:  No jugular venous distention, waveform within normal limits, carotid upstroke brisk and symmetric, no bruits, no thyromegaly LUNGS:  Clear to auscultation bilaterally CHEST:  Unremarkable HEART:  PMI not displaced or sustained,S1 and S2 within normal limits, no S3, no S4, no clicks, no rubs, *** murmurs ABD:  Flat, positive bowel sounds normal in frequency in pitch, no bruits, no rebound, no guarding, no midline pulsatile mass, no hepatomegaly, no splenomegaly EXT:  2 plus pulses throughout, no edema, no cyanosis no clubbing    ***GENERAL:  Well appearing NECK:  No jugular venous distention, waveform within normal limits, carotid upstroke brisk and symmetric, bilaterbruits, no thyromegaly LUNGS:  Clear to auscultation bilaterally BACK:  No CVA tenderness CHEST:  Unremarkable HEART:  PMI not displaced or sustained,S1 and S2 within normal limits, no S3, no S4, no clicks, no rubs, no murmurs ABD:  Flat, positive bowel sounds normal in frequency in pitch, no bruits, no rebound, no guarding, no midline pulsatile mass, no hepatomegaly, no splenomegaly EXT:  2 plus pulses 2+ posterior tibialis on the right, absent posterior tibialis on the left, absent dorsalis pedis bilaterally, no edema, no cyanosis no clubbing   EKG:  EKG is  ***ordered today. ***   Recent Labs: 11/13/2018: Hemoglobin 16.5; Magnesium 1.9; Platelets 243; TSH 1.65 10/08/2019: ALT 14 10/18/2019: BUN 22; Creat 1.87; Potassium 4.1; Sodium 140    Lipid Panel    Component Value Date/Time   CHOL 132 10/08/2019 1554   TRIG 109.0 10/08/2019 1554   HDL 44.30 10/08/2019 1554   CHOLHDL 3 10/08/2019 1554   VLDL 21.8 10/08/2019 1554   LDLCALC 66 10/08/2019 1554   LDLCALC 73 11/13/2018 1129   LDLDIRECT 146.4 01/30/2010 1056      Wt Readings from Last 3 Encounters:  10/08/19 190 lb (86.2 kg)  08/27/19 187 lb 6.4 oz (85 kg)    08/06/19 186 lb (84.4 kg)      Other studies Reviewed: Additional studies/ records that were reviewed today include: *** Review of the above records demonstrates: ***   ASSESSMENT AND PLAN:  AORTIC CALCIFICATION:    We talked about risk reduction.  This is addressed below.***   CORONARY CALCIFICATION:  She had a negative Lexiscan Myoview.  ***  She will continue with risk reduction.   SOB:   This is chronic.  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  She needs to see a pulmonologist.  If she is seen and managed by them and continues to have progressive dyspnea I might consider further coronary evaluation but I do not think this is an ischemic etiology.   HTN:     Blood pressure is borderline.  No change in therapy.   DYSLIPIDEMIA:Her LDL was 73 with an HDL of 40.   She will continue the meds as listed.  DM:Her A1c is greater than 10.   She is now having this followed by Dr. Cruzita Lederer.    TOBACCO ABUSE:   *** She has previously been reluctant to discuss this.    We talked about this today though.   PVD:  ***  She has is followed by [facility]  COVID EDUCATION:  ***   Current medicines are reviewed at length with the patient today.  The patient does not have concerns regarding medicines.  The following changes have been made:  ***  Labs/ tests ordered today include: ***  No orders of the defined types were placed in this encounter.    Disposition:   FU with me in *** months.     Signed, Minus Breeding, MD  11/10/2019 9:08 PM    [place]

## 2019-11-12 ENCOUNTER — Ambulatory Visit: Payer: Medicare Other | Admitting: Cardiology

## 2019-11-13 ENCOUNTER — Other Ambulatory Visit: Payer: Self-pay | Admitting: Surgery

## 2019-11-19 ENCOUNTER — Ambulatory Visit
Admission: RE | Admit: 2019-11-19 | Discharge: 2019-11-19 | Disposition: A | Discharge status: Home / Self Care | Payer: Medicare Other | Source: Ambulatory Visit | Attending: Cardiothoracic Surgery | Admitting: Cardiothoracic Surgery

## 2019-11-19 ENCOUNTER — Ambulatory Visit
Admission: RE | Admit: 2019-11-19 | Discharge: 2019-11-19 | Disposition: A | Discharge status: Home / Self Care | Payer: Medicare Other | Source: Ambulatory Visit | Attending: Surgery | Admitting: Surgery

## 2019-11-19 DIAGNOSIS — H0520 Unspecified exophthalmos: Secondary | ICD-10-CM | POA: Diagnosis not present

## 2019-11-19 DIAGNOSIS — R911 Solitary pulmonary nodule: Secondary | ICD-10-CM

## 2019-11-19 DIAGNOSIS — R918 Other nonspecific abnormal finding of lung field: Secondary | ICD-10-CM | POA: Diagnosis not present

## 2019-11-22 ENCOUNTER — Encounter: Payer: Self-pay | Admitting: Cardiothoracic Surgery

## 2019-11-22 ENCOUNTER — Other Ambulatory Visit: Payer: Self-pay

## 2019-11-22 ENCOUNTER — Ambulatory Visit (INDEPENDENT_AMBULATORY_CARE_PROVIDER_SITE_OTHER): Payer: Medicare Other | Admitting: Cardiothoracic Surgery

## 2019-11-22 VITALS — BP 150/73 | HR 57 | Temp 97.5°F | Resp 24 | Ht 66.0 in | Wt 189.0 lb

## 2019-11-22 DIAGNOSIS — I779 Disorder of arteries and arterioles, unspecified: Secondary | ICD-10-CM | POA: Diagnosis not present

## 2019-11-22 DIAGNOSIS — R918 Other nonspecific abnormal finding of lung field: Secondary | ICD-10-CM

## 2019-11-22 NOTE — Progress Notes (Signed)
[name]       [place] [address]             [phone]                    [name] Medical Record #[id number] Date of Birth: 11-06-1945  Referring: [name], PA-C Primary Care: [name], MD Primary Cardiologist: Minus Breeding, MD  Chief Complaint:    Chief Complaint  Patient presents with  . Lung Lesion    f/u w/ CT Super-D 11/19/19    History of Present Illness:    [name] 74 y.o. female is seen in the office  today for follow-up CT scan for pulmonary nodules.  The patient is a long-term smoker,  smoking approximately a third of a pack a day.  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   She returns today because of a persistent groundglass opacity in the apex of the right lung that has been present for at least 2 years-he comes in today with a follow-up CT of the chest.   Current Activity/ Functional Status:  Patient is independent with mobility/ambulation, transfers, ADL's, IADL's.   Zubrod Score: At the time of surgery this patient's most appropriate activity status/level should be described as: []     0    Normal activity, no symptoms [x]     1    Restricted in physical strenuous activity but ambulatory, able to do out light work []     2    Ambulatory and capable of self care, unable to do work activities, up and about               >50 % of waking hours                              []     3    Only limited self care, in bed greater than 50% of waking hours []     4    Completely disabled, no self care, confined to bed or chair []     5    Moribund   Past Medical History:  Diagnosis Date  . Abnormal findings on esophagogastroduodenoscopy (EGD) 07/2010  . Aneurysm [facility]) 2013   Right Brain   . Aneurysm ([place])    in brain x 2  . Anxiety   . Colon polyps   . Diabetes mellitus 1998  . Diverticulosis   . ESOPHAGEAL STRICTURE  08/27/2008  . GERD (gastroesophageal reflux disease)   . Hemorrhoids   . Hiatal hernia   . Hyperlipidemia   . Hypertension 2000  . Migraines   . Peripheral vascular disease ([place])   . Phlebitis    30 years ago  left leg  . Stroke [facility])    multiple mini strokes ( brain aneurysm )    Past Surgical History:  Procedure Laterality Date  . ABDOMINAL AORTAGRAM N/A 01/04/2012   Procedure: ABDOMINAL Maxcine Ham;  Surgeon: Serafina Mitchell, MD;  Location: [facility] CATH LAB;  Service: Cardiovascular;  Laterality: N/A;  . ABDOMINAL AORTAGRAM N/A 08/15/2012   Procedure: ABDOMINAL Maxcine Ham;  Surgeon: Serafina Mitchell, MD;  Location: [facility] CATH LAB;  Service: Cardiovascular;  Laterality: N/A;  . ABDOMINAL HYSTERECTOMY  1984  . cataract surgery Right 10-22-2015  . cataract surgery Left 11-05-2015  . CESAREAN SECTION    .  CHOLECYSTECTOMY    . COLONOSCOPY  07/2010  . DENTAL SURGERY  Aug. 16, 2013   left lower   . ESOPHAGOGASTRODUODENOSCOPY  07/2010  . EYE SURGERY  Nov. 2014   Laser-Glaucoma  . FEMORAL ARTERY STENT  05/11/11   Left superficial femoral and popliteal artery  . HERNIA REPAIR     times two  . KNEE SURGERY    . LOWER EXTREMITY ANGIOGRAM Left 08/15/2012   Procedure: LOWER EXTREMITY ANGIOGRAM;  Surgeon: Serafina Mitchell, MD;  Location: [facility] CATH LAB;  Service: Cardiovascular;  Laterality: Left;  lt leg angio  . rotator cuff surgery    . THYROID SURGERY      Family History  Problem Relation Age of Onset  . CAD Mother 57       Died of MI  . Hypertension Mother   . Heart attack Mother   . Heart disease Mother   . CAD Father 50       Died of MI  . Heart disease Father   . Heart attack Father   . CAD Brother 55       Two brothers died of MI  . Heart attack Brother   . Heart disease Brother        Amputation  . Diabetes Sister   . Hypertension Sister   . Heart attack Brother   . Heart disease Brother   . Stroke Sister   . Colon cancer Neg Hx   . Stomach cancer Neg Hx   . Esophageal  cancer Neg Hx      Social History   Tobacco Use  Smoking Status Current Every Day Smoker  . Packs/day: 0.25  . Years: 40.00  . Pack years: 10.00  . Types: Cigarettes  Smokeless Tobacco Never Used    Social History   Substance and Sexual Activity  Alcohol Use No  . Alcohol/week: 0.0 standard drinks     Allergies  Allergen Reactions  . Ciprofloxacin Other (See Comments)  . Ozempic (0.25 Or 0.5 Mg-Dose) [Semaglutide(0.25 Or 0.40m-Dos)] Nausea And Vomiting  . Plavix [Clopidogrel Bisulfate] Palpitations  . Amoxicillin Itching and Swelling    FACE & EYES SWELL  . Ace Inhibitors   . Ciprofloxacin Hcl Swelling  . Lyrica [Pregabalin]     Itch, gain weight  . Penicillins Other (See Comments)    REACTION: unspecified    Current Outpatient Medications  Medication Sig Dispense Refill  . amLODipine (NORVASC) 10 MG tablet Take 1 tablet Daily for BP 90 tablet 3  . aspirin EC 81 MG tablet Take 81 mg by mouth at bedtime.     . bisoprolol-hydrochlorothiazide (ZIAC) 10-6.25 MG tablet Take 1 tablet by mouth daily. 90 tablet 3  . Blood Glucose Monitoring Suppl (ACCU-CHEK AVIVA PLUS) W/DEVICE KIT Check blood sugar up to three times daily 1 kit 0  . Cholecalciferol (VITAMIN D PO) Take 5,000 Units by mouth daily.    .Marland KitchenCINNAMON PO Take 1,000 mg by mouth 2 (two) times daily.     . citalopram (CELEXA) 40 MG tablet Take 1 tablet Daily for Mood & Anxiety 90 tablet 3  . clonazePAM (KLONOPIN) 0.5 MG tablet TAKE 1/2-1 TAB AT BEDTIME IF NEEDED FOR SLEEP & TRY TO LIMIT TO 5 DAYS/WEEK TO AVOID ADDICTION 30 tablet 0  . dapagliflozin propanediol (FARXIGA) 10 MG TABS tablet Take 10 mg by mouth daily before breakfast. 90 tablet 3  . glucose blood (ACCU-CHEK AVIVA PLUS) test strip Use daily to check BS TID Dx. EV95.63  300 each 1  . insulin NPH-regular Human (NOVOLIN 70/30) (70-30) 100 UNIT/ML injection INJECT 35 UNITSUNDER THE SKIN IN THE MORNING BEFORE FOOD, AND INJECT 30 UNITS BEFORE THE EVENING MEAL. 60 mL  1  . Insulin Syringe-Needle U-100 31G X 15/64" 0.5 ML MISC Use two pens daily with insulin 100 each 3  . Magnesium 500 MG TABS Take 500 mg by mouth daily.    Marland Kitchen olmesartan (BENICAR) 40 MG tablet Take 1 tablet Daily for BP 90 tablet 3  . Omega-3 Fatty Acids (FISH OIL) 1200 MG CAPS Take 1,200 capsules by mouth daily.     Marland Kitchen omeprazole (PRILOSEC) 40 MG capsule TAKE 1 CAPSULE BY MOUTH EVERY DAY 90 capsule 2  . OVER THE COUNTER MEDICATION Take 1 tablet by mouth daily. One a day vitamin Woman's one a day    . rosuvastatin (CRESTOR) 40 MG tablet Take 1 /2 to 1 tablet Daily for Cholesterol 90 tablet 1  . vitamin E 400 UNIT capsule Take 400 Units by mouth daily.    Marland Kitchen albuterol (VENTOLIN HFA) 108 (90 Base) MCG/ACT inhaler Inhale 2 puffs into the lungs every 4 (four) hours as needed for wheezing or shortness of breath. (Patient not taking: Reported on 11/22/2019) 18 g 11  . Tiotropium Bromide Monohydrate (SPIRIVA RESPIMAT) 2.5 MCG/ACT AERS Inhale 1 puff into the lungs daily. (Patient not taking: Reported on 11/22/2019) 4 g 11   No current facility-administered medications for this visit.    Pertinent items are noted in HPI.   Review of Systems:     Cardiac Review of Systems: [Y] = yes  or   [ N ] = no   Chest Pain [  N ]  Resting SOB [ N ] Exertional SOB  [ Y ]  Orthopnea [ N]   Pedal Edema [  N]    Palpitations [ N] Syncope  Aqua.Slicker  ]   Presyncope Aqua.Slicker ]   General Review of Systems: [Y] = yes [  ]=no Constitional: recent weight change [  ];  Wt loss over the last 3 months [   ] anorexia [  ]; fatigue [  ]; nausea [  ]; night sweats [  ]; fever [  ]; or chills [  ];           Eye : blurred vision [  ]; diplopia [   ]; vision changes [  ];  Amaurosis fugax[  ]; Resp: cough [  ];  wheezing[  ];  hemoptysis[  ]; shortness of breath[  ]; paroxysmal nocturnal dyspnea[  ]; dyspnea on exertion[  ]; or orthopnea[  ];  GI:  gallstones[  ], vomiting[  ];  dysphagia[  ]; melena[  ];  hematochezia [  ]; heartburn[  ];   Hx  of  Colonoscopy[  ]; GU: kidney stones [  ]; hematuria[  ];   dysuria [  ];  nocturia[  ];  history of     obstruction [  ]; urinary frequency [  ]             Skin: rash, swelling[  ];, hair loss[  ];  peripheral edema[  ];  or itching[  ]; Musculosketetal: myalgias[  ];  joint swelling[  ];  joint erythema[  ];  joint pain[  ];  back pain[  ];  Heme/Lymph: bruising[  ];  bleeding[  ];  anemia[  ];  Neuro: TIA[  ];  headaches[  ];  stroke[y  ];  vertigo[  ];  seizures[  ];   paresthesias[  ];  difficulty walking[  ];  Psych:depression[  ]; anxiety[  ];  Endocrine: diabetes[  ];  thyroid dysfunction[  ];  Immunizations: Flu up to date [  ]; Pneumococcal up to date [  ];  Other:     PHYSICAL EXAMINATION: BP (!) 150/73 (BP Location: Left Arm, Patient Position: Sitting, Cuff Size: Large)   Pulse (!) 57   Temp (!) 97.5 F (36.4 C) (Temporal)   Resp (!) 24   Ht 5' 6"  (1.676 m)   Wt 189 lb (85.7 kg)   SpO2 91% Comment: RA  BMI 30.51 kg/m  General appearance: alert, cooperative and no distress Head: Normocephalic, without obvious abnormality, atraumatic Neck: no adenopathy, no carotid bruit, no JVD, supple, symmetrical, trachea midline and thyroid not enlarged, symmetric, no tenderness/mass/nodules Lymph nodes: Cervical, supraclavicular, and axillary nodes normal. Resp: clear to auscultation bilaterally Cardio: regular rate and rhythm, S1, S2 normal, no murmur, click, rub or gallop GI: soft, non-tender; bowel sounds normal; no masses,  no organomegaly Extremities: extremities normal, atraumatic, no cyanosis or edema Neurologic: Grossly normal  Diagnostic Studies & Laboratory data:     Recent Radiology Findings:   CT ORBITS WO CONTRAST  Result Date: 11/19/2019 CLINICAL DATA:  Proptosis EXAM: CT ORBITS WITHOUT CONTRAST TECHNIQUE: Multidetector CT images were obtained using the standard protocol without intravenous contrast. COMPARISON:  None. FINDINGS: Orbits: No traumatic or  inflammatory finding. Mild, symmetric bilateral proptosis without mass or other abnormality of the orbits. Status post bilateral cataract surgery. Globes, optic nerves, orbital fat, extraocular muscles, vascular structures, and lacrimal glands are otherwise normal. Visualized sinuses: Clear. Soft tissues: Negative. Limited intracranial: No significant or unexpected finding. 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 Electronically Signed   By: Eddie Candle M.D.   On: 11/19/2019 16:25   CT Super D Chest Wo Contrast  Result Date: 11/19/2019 CLINICAL DATA:  Follow-up pulmonary lesions. EXAM: CT CHEST WITHOUT CONTRAST TECHNIQUE: Multidetector CT imaging of the chest was performed using thin slice collimation for electromagnetic bronchoscopy planning purposes, without intravenous contrast. COMPARISON:  04/19/2019 FINDINGS: Cardiovascular: The heart is normal in size. No pericardial effusion. Stable tortuosity, mild ectasia and advanced calcifications involving the thoracic aorta. Stable advanced branch vessel calcifications including three-vessel coronary artery calcifications. Mediastinum/Nodes: Stable scattered mediastinal and hilar lymph nodes all measuring less than 8 mm. Stable calcified right hilar and right mediastinal lymph nodes and stable calcified granuloma in the right lower lobe. The esophagus is grossly normal. The thyroid gland is unremarkable. Lungs/Pleura: Stable stable emphysematous changes and areas of pulmonary scarring. Right upper lobe ground-glass opacity on image 28/8 measures 17.5 x 14 mm. This previously measured 16 x 12 mm. No solid nodular components are identified. The 5 mm nodule in the left lower lobe on the prior study on image 58/8 is no longer identified and was likely an area of inflammation. Stable large calcified granuloma in the right lower lobe. Small scattered sub solid  nodular lesions in the right upper lobe. None of these measures more than 4 mm. 4 mm nodule in the right lower lobe anteriorly on image 64/8 is stable. Stable area of right middle lobe bronchiectasis. Upper Abdomen: No significant upper abdominal findings. No worrisome hepatic or adrenal gland lesions. Stable mild enlargement and slight nodularity of the left adrenal gland. Stable advanced aortic and branch vessel calcifications and  stable surgical changes from anterior abdominal wall hernia repair. Stable calcified granulomas in the spleen and liver. Musculoskeletal: No chest wall mass, breast masses, supraclavicular or axillary adenopathy. The bony structures are unremarkable. IMPRESSION: 1. Stable emphysematous changes and areas of pulmonary scarring. 2. Slight interval enlargement of the ground-glass opacity in the right upper lobe. Continued surveillance versus PET-CT. 3. Other stable, some resolved and some new small scattered sub solid nodular lesions. 4. Stable calcified granuloma in the right lower lobe, calcified right mediastinal and hilar lymph nodes and calcified granulomas in the liver and spleen. 5. Stable advanced atherosclerotic calcifications involving the thoracic and abdominal aorta and branch vessels including three-vessel coronary artery calcifications. 6. Stable mild enlargement and nodularity of the left adrenal gland. Aortic Atherosclerosis (ICD10-I70.0) and Emphysema (ICD10-J43.9). Aortic Atherosclerosis (ICD10-I70.0) and Emphysema (ICD10-J43.9). Electronically Signed   By: Marijo Sanes M.D.   On: 11/19/2019 11:58     I have independently reviewed the above radiology studies  and reviewed the findings with the patient.   03/2019     04/2015   ADDENDUM REPORT: 12/14/2018 16:52  ADDENDUM: Cardiothoracic surgeon Dr. Servando Snare has requested a second opinion on this case.  ADDENDED FINDINGS:  Cardiovascular: Normal heart size. No significant  pericardial effusion/thickening. Three-vessel coronary atherosclerosis. Atherosclerotic nonaneurysmal thoracic aorta. Top-normal caliber main pulmonary artery (3 1 cm diameter) stable.  Mediastinum/Nodes: No discrete thyroid nodules. Unremarkable esophagus. No axillary adenopathy. Stable coarsely calcified nonenlarged right paratracheal and right hilar nodes from prior granulomatous disease. No pathologically enlarged mediastinal or discrete hilar nodes on this noncontrast scan.  Lungs/Pleura: No pneumothorax. No pleural effusion. Moderate centrilobular emphysema with diffuse bronchial wall thickening. Medial apical right upper lobe 2.0 x 1.3 cm ground-glass pulmonary nodule (series 3/image 29), previously 1.8 x 1.3 cm on 10/10/2017 chest CT and 1.7 x 1.2 cm on 10/25/2014 chest CT using similar measurement technique, minimally increased in size. Ground-glass 0.5 cm anterior right upper lobe pulmonary nodule (series 3/image 64) is stable since 10/25/2014 chest CT. Stable coarsely calcified 9 mm peripheral right lower lobe granuloma. Ground-glass 5 mm left lower lobe pulmonary nodule (series 3/image 68) and subpleural peripheral basilar 4 mm left lower lobe solid pulmonary nodule (series 3/image 104) are stable since 10/25/2014 chest CT. No acute consolidative airspace disease, lung masses or new significant pulmonary nodules. Stable moderate varicoid bronchiectasis in the posterior right middle lobe.  Upper abdomen: Stable granulomatous splenic calcifications. Stable appearance of the mildly thickened adrenal glands without discrete adrenal nodules.  Musculoskeletal: No aggressive appearing focal osseous lesions. Mild thoracic spondylosis.  ADDENDED IMPRESSION:  1. Dominant ground-glass pulmonary nodule in the medial apical right upper lobe measures 2.0 x 1.3 cm and has minimally increased in size on consecutive chest CT studies back to 10/25/2014. Continued chest CT  surveillance warranted at a minimum. Please note that this addendum corrects a laterality error in the original report. 2. Additional subcentimeter ground-glass and solid pulmonary nodules are unchanged back to 2016 chest CT. 3. No thoracic adenopathy. 4. Three-vessel coronary atherosclerosis.  Aortic Atherosclerosis (ICD10-I70.0) and Emphysema (ICD10-J43.9).  These addended results will be called to the ordering clinician or representative by the Radiologist Assistant, and communication documented in the PACS or zVision Dashboard.   Electronically Signed By: Ilona Sorrel M.D. On: 12/14/2018 16:52     Recent Lab Findings: Lab Results  Component Value Date   WBC 10.3 11/13/2018   HGB 16.5 (H) 11/13/2018   HCT 48.3 (H) 11/13/2018   PLT 243 11/13/2018   GLUCOSE  314 (H) 10/18/2019   CHOL 132 10/08/2019   TRIG 109.0 10/08/2019   HDL 44.30 10/08/2019   LDLDIRECT 146.4 01/30/2010   LDLCALC 66 10/08/2019   ALT 14 10/08/2019   AST 19 10/08/2019   NA 140 10/18/2019   K 4.1 10/18/2019   CL 105 10/18/2019   CREATININE 1.87 (H) 10/18/2019   BUN 22 10/18/2019   CO2 28 10/18/2019   TSH 1.65 11/13/2018   INR 0.87 01/25/2017   HGBA1C 9.1 (A) 07/09/2019    PFT's Severe Obstructive Airways Disease Insignificant response to bronchodilator Restrictive process suggested by lack of over-inflation Minimal Diffusion Defect FEV1 1.2   50% predicted  Assessment / Plan:   #1 groundglass opacity right upper lobe medially appears basically unchanged since 2016-other small nodules are unchanged   I have reviewed the CT scan with the patient and the persistent nature of the groundglass opacity in the right upper lobe.  She was not interested in surgical resection of this.  Although suggested by radiology I do not think a PET scan will help in this situation as this is a groundglass opacity and likely will not be hypermetabolic.  I discussed with the patient continued follow-up CT in  6 months, if we see enlargement or mass becoming more solid proceed with navigation bronchoscopy and biopsy.  #2 history of stroke x3, has known stable bilateral cerebral aneurysms  #3 underlying severe obstructive airway disease with FEV1 1.2  50% predicted.    Grace Isaac MD      [name],[place] [address] Office [phone]   Beeper [phone]  11/22/2019 3:58 PM

## 2019-11-26 DIAGNOSIS — H40023 Open angle with borderline findings, high risk, bilateral: Secondary | ICD-10-CM | POA: Diagnosis not present

## 2019-11-26 DIAGNOSIS — H538 Other visual disturbances: Secondary | ICD-10-CM | POA: Diagnosis not present

## 2019-11-26 DIAGNOSIS — H0520 Unspecified exophthalmos: Secondary | ICD-10-CM | POA: Diagnosis not present

## 2019-11-26 DIAGNOSIS — H40053 Ocular hypertension, bilateral: Secondary | ICD-10-CM | POA: Diagnosis not present

## 2019-11-26 NOTE — Progress Notes (Signed)
Medicare Wellness and 3 month   Assessment:    Encounter for Medicare annual wellness exam 1 year  Atherosclerosis of aorta ([place]) Control blood pressure, cholesterol, glucose, increase exercise.   PAOD (peripheral arterial occlusive disease) ([place]) Continue follow up Suggest trying to increase walking  Peripheral vascular disease due to secondary diabetes mellitus ([place]) Continue to control sugars  PVD (peripheral vascular disease) ([place]) Control blood pressure, cholesterol, glucose, increase exercise.   Type 2 diabetes mellitus with other circulatory complication, with long-term current use of insulin ([place]) Discussed general issues about diabetes pathophysiology and management., Educational material distributed., Suggested low cholesterol diet., Encouraged aerobic exercise., Discussed foot care., Reminded to get yearly retinal exam.  Pulmonary emphysema, unspecified emphysema type ([place]) Try to get back on the spiriva  CKD stage 3 due to type 2 diabetes mellitus ([place]) Monitor kidney function  Neuropathy due to secondary diabetes ([place]) Monitor feet, follow back up with podiatry  Depression, major, recurrent, in partial remission ([place]) In remission  Hyperlipidemia associated with type 2 diabetes mellitus ([place]) At goal of less than 70  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[place]) Continue to follow with eye doctor  Atherosclerosis of native artery of extremity with intermittent claudication, unspecified extremity ([place]) Monitor  Essential hypertension - continue medications, DASH diet, exercise and monitor at home. Call if greater than 130/80.   Lung nodule Continue follow up cardiothoracic surgery  Medication management  Tobacco abuse Continue to try to cut back/stop  Vitamin D deficiency Continue supplement  SOB (shortness of breath) Try the spiriva again with spacer  History of CVA (cerebrovascular accident) Control blood pressure, cholesterol, glucose, increase  exercise.   Nontoxic multinodular goiter Monitor  Brain aneurysm  Cardiomegaly Monitor  Coronary artery calcification Follow up cardio  Migraine with aura and without status migrainosus, not intractable Monitor  Acute pain of both knees -     Ambulatory referral to Orthopedic Surgery - it is affecting her being able to walk, she does not want surgery      Future Appointments  Date Time Provider [place]  12/03/2019  9:00 AM [facility] HS VASC 3 - EM [facility] VVS  12/03/2019 10:00 AM [facility]-CV HS VASC 3 - EM [facility] VVS  12/03/2019 10:15 AM [facility] PA [facility]  12/31/2019 11:20 AM Minus Breeding, MD [facility]  01/14/2020  2:40 PM Philemon Kingdom, MD [facility] None  06/16/2020 11:00 AM [name], MD [facility] None  12/15/2020 10:00 AM Karen Mutters, PA-C [facility] None  ' Plan:   During the course of the visit the patient was educated and counseled about appropriate screening and preventive services including:    Pneumococcal vaccine   Influenza vaccine  Td vaccine  Screening electrocardiogram  Screening mammography  Screening Pap smear and pelvic exam   Bone densitometry screening  Colorectal cancer screening  Diabetes screening  Glaucoma screening  Nutrition counseling   Smoking cessation counseling   Subjective:   [name] is a 74 y.o. female who presents for Medicare Annual Wellness Visit and 3 month follow up on hypertension, diabetes, hyperlipidemia, vitamin D def.   Her blood pressure has been controlled at home, today their BP is BP: 126/74   She does workout rides her bike at home. She has SOB.  She is really unable to exercise significantly due to pain in her legs, she is rather inactive. She is very concerned with her ADLs and being able to keep her independence/quality of life.  She has lower back pain, pain bilateral legs  with walking and standing.   She has COPD, has lung nodules, last one was CT chest  (11/2019) - following with Dr. Servando Snare, will have follow up 6 months. She states the spiriva made her throat very sore after 1 week so she has not used it, she has albuterol as needed  She was following with Dr. Windy Kalata, but he retired and she has not followed up with nuerology. Has history of migraines that has improved with age. She has been having abnormal vision, will reschedule Dr. Herbert Deaner.    She has several comorbidities from her DM including  Neuropathy Retinopathy Dr. Kathlen Mody 11/26/2019- follow up 6 months Hyperlipidemia at goal less than 70, on crestor 40 mg CKD she is on ARB (off metformin due to CKDq)  history of TIA she is on 325 ASA PAD s/p stents  Novolog 70/30 mix on 35 and 30 units-9 Could not tolerate invokana- she is on farxiga and tolerating it well she been working on diet and exercise for diabetes and following with Dr. Darnell Level denies polydipsia, polyuria and visual disturbances.  Last A1C in the office was:  Lab Results  Component Value Date   HGBA1C 9.1 (A) 07/09/2019    Lab Results  Component Value Date   CHOL 132 10/08/2019   HDL 44.30 10/08/2019   LDLCALC 66 10/08/2019   LDLDIRECT 146.4 01/30/2010   TRIG 109.0 10/08/2019   CHOLHDL 3 10/08/2019   Lab Results  Component Value Date   GFRAA 31 (L) 10/18/2019    Patient is on Vitamin D. Lab Results  Component Value Date   VD25OH 27 (L) 04/18/2018     Names of Other Physician/Practitioners you currently use: 1. [facility]- here for primary care 2. Dr. Herbert Deaner, DIABETIC eye doctor, last visit 11/2019 Retinopathy Patient Care Team: [name], MD as PCP - General (Internal Medicine) Minus Breeding, MD as PCP - Cardiology (Cardiology) Michel Santee, MD as Consulting Physician (Neurology) Serafina Mitchell, MD as Consulting Physician (Vascular Surgery) Irene Shipper, MD as Consulting Physician (Gastroenterology) Gardiner Barefoot, DPM as Consulting Physician  (Podiatry) Hortencia Pilar, MD as Consulting Physician (Surgery) Grace Isaac, MD as Consulting Physician (Cardiothoracic Surgery)  Medication Review  Current Outpatient Medications (Endocrine & Metabolic):  .  dapagliflozin propanediol (FARXIGA) 10 MG TABS tablet, Take 10 mg by mouth daily before breakfast. .  insulin NPH-regular Human (NOVOLIN 70/30) (70-30) 100 UNIT/ML injection, INJECT 35 UNITSUNDER THE SKIN IN THE MORNING BEFORE FOOD, AND INJECT 30 UNITS BEFORE THE EVENING MEAL.  Current Outpatient Medications (Cardiovascular):  .  amLODipine (NORVASC) 10 MG tablet, Take 1 tablet Daily for BP .  bisoprolol-hydrochlorothiazide (ZIAC) 10-6.25 MG tablet, Take 1 tablet by mouth daily. Marland Kitchen  olmesartan (BENICAR) 40 MG tablet, Take 1 tablet Daily for BP .  rosuvastatin (CRESTOR) 40 MG tablet, Take 1 /2 to 1 tablet Daily for Cholesterol  Current Outpatient Medications (Respiratory):  .  albuterol (VENTOLIN HFA) 108 (90 Base) MCG/ACT inhaler, Inhale 2 puffs into the lungs every 4 (four) hours as needed for wheezing or shortness of breath. .  Tiotropium Bromide Monohydrate (SPIRIVA RESPIMAT) 2.5 MCG/ACT AERS, Inhale 1 puff into the lungs daily.  Current Outpatient Medications (Analgesics):  .  aspirin EC 81 MG tablet, Take 81 mg by mouth at bedtime.    Current Outpatient Medications (Other):  .  Blood Glucose Monitoring Suppl (ACCU-CHEK AVIVA PLUS) W/DEVICE KIT, Check blood sugar up to three times daily .  Cholecalciferol (VITAMIN D PO),  Take 5,000 Units by mouth daily. Marland Kitchen  CINNAMON PO, Take 1,000 mg by mouth 2 (two) times daily.  .  citalopram (CELEXA) 40 MG tablet, Take 1 tablet Daily for Mood & Anxiety .  clonazePAM (KLONOPIN) 0.5 MG tablet, TAKE 1/2-1 TAB AT BEDTIME IF NEEDED FOR SLEEP & TRY TO LIMIT TO 5 DAYS/WEEK TO AVOID ADDICTION .  glucose blood (ACCU-CHEK AVIVA PLUS) test strip, Use daily to check BS TID Dx. E11.22 .  Insulin Syringe-Needle U-100 31G X 15/64" 0.5 ML MISC,  Use two pens daily with insulin .  Magnesium 500 MG TABS, Take 500 mg by mouth daily. .  Omega-3 Fatty Acids (FISH OIL) 1200 MG CAPS, Take 1,200 capsules by mouth daily.  Marland Kitchen  omeprazole (PRILOSEC) 40 MG capsule, TAKE 1 CAPSULE BY MOUTH EVERY DAY .  OVER THE COUNTER MEDICATION, Take 1 tablet by mouth daily. One a day vitamin Woman's one a day .  vitamin E 400 UNIT capsule, Take 400 Units by mouth daily.  Current Problems (verified) Patient Active Problem List   Diagnosis Date Noted  . Migraine with aura and without status migrainosus, not intractable 11/28/2019  . Hyperlipidemia associated with type 2 diabetes mellitus ([place]) 11/10/2019  . Coronary artery calcification 08/05/2019  . PAOD (peripheral arterial occlusive disease) ([place]) 02/26/2019  . SOB (shortness of breath) 11/17/2018  . Cardiomegaly 11/17/2018  . Emphysema of lung ([place]) 10/10/2017  . Atherosclerosis of aorta ([place]) 10/10/2017  . Lung nodule 10/10/2017  . Anxiety 08/22/2017  . Arthritis 08/22/2017  . Neuropathy due to secondary diabetes ([place]) 08/22/2017  . PVD (peripheral vascular disease) ([place]) 04/13/2017  . Type 2 diabetes mellitus with circulatory disorder, with long-term current use of insulin ([place]) 11/07/2015  . Encounter for Medicare annual wellness exam 06/24/2015  . Generalized anxiety disorder 03/26/2015  . Depression, major, recurrent, in partial remission ([place]) 03/26/2015  . History of CVA (cerebrovascular accident) 10/04/2014  . Tobacco abuse 10/04/2014  . Vitamin D deficiency 11/02/2013  . Medication management 11/02/2013  . Peripheral vascular disease due to secondary diabetes mellitus ([place]) 05/08/2012  . Atherosclerosis of native arteries of extremity with intermittent claudication 12/24/2011  . Diverticulosis of large intestine 06/09/2010  . History of colonic polyps 06/09/2010  . Esophageal reflux 08/27/2008  . Nontoxic multinodular goiter 03/11/2008  . 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[place]) 03/11/2008   . Brain aneurysm 03/11/2008  . CKD stage 3 due to type 2 diabetes mellitus ([place]) 05/01/2007  . Essential hypertension 05/01/2007    Screening Tests Immunization History  Administered Date(s) Administered  . DT (Pediatric) 03/26/2015  . Fluad Quad(high Dose 65+) 07/09/2019  . Influenza Whole 08/02/2007, 06/18/2009, 07/09/2013  . Influenza, High Dose Seasonal PF 08/05/2015, 07/22/2016, 07/04/2017  . Pneumococcal Conjugate-13 07/22/2016  . Pneumococcal Polysaccharide-23 02/08/2014  . Td 09/21/2004   Health Maintenance  Topic Date Due  . FOOT EXAM  04/19/2019  . COLONOSCOPY  09/21/2019  . MAMMOGRAM  10/06/2019  . HEMOGLOBIN A1C  01/07/2020  . OPHTHALMOLOGY EXAM  05/13/2020  . TETANUS/TDAP  03/25/2025  . INFLUENZA VACCINE  Completed  . DEXA SCAN  Completed  . Hepatitis C Screening  Completed  . PNA vac Low Risk Adult  Completed    Preventative care: Last colonoscopy: 2011 Dr. Henrene Pastor due 10 years Last mammogram: 09/2017 has scheduled Last pap smear/pelvic exam: 2007  DEXA: 2016 OVERDUE Echo 01/2017 EF 60-65% ABI 2014 MRI/MRA  brain 05.2018 IMPRESSION: 1 cm acute infarct left lateral thalamus and internal capsule Chronic infarct right cerebellum  Moderate stenosis left posterior cerebral artery. No other significant intracranial stenosis.  Ct chest WO contrast 11/2019  Prior vaccinations: TD or Tdap: 2016  Influenza: 20203 Pneumococcal: 2015 Prevnar 13:2017 Shingles/Zostavax: Check price  Allergies Allergies  Allergen Reactions  . Ciprofloxacin Other (See Comments)  . Ozempic (0.25 Or 0.5 Mg-Dose) [Semaglutide(0.25 Or 0.'5mg'$ -Dos)] Nausea And Vomiting  . Plavix [Clopidogrel Bisulfate] Palpitations  . Amoxicillin Itching and Swelling    FACE & EYES SWELL  . Ace Inhibitors   . Ciprofloxacin Hcl Swelling  . Lyrica [Pregabalin]     Itch, gain weight  . Penicillins Other (See Comments)    REACTION: unspecified    SURGICAL HISTORY She  has a past surgical history  that includes Cesarean section; Cholecystectomy; Knee surgery; rotator cuff surgery; Thyroid surgery; Hernia repair; Abdominal hysterectomy (1984); Femoral artery stent (05/11/11); Dental surgery (Aug. 16, 2013); Eye surgery (Nov. 2014); Esophagogastroduodenoscopy (07/2010); Colonoscopy (07/2010); abdominal aortagram (N/A, 01/04/2012); abdominal aortagram (N/A, 08/15/2012); lower extremity angiogram (Left, 08/15/2012); cataract surgery (Right, 10-22-2015); and cataract surgery (Left, 11-05-2015). FAMILY HISTORY Her family history includes CAD (age of onset: 4) in her brother; CAD (age of onset: 60) in her father and mother; Diabetes in her sister; Heart attack in her brother, brother, father, and mother; Heart disease in her brother, brother, father, and mother; Hypertension in her mother and sister; Stroke in her sister. SOCIAL HISTORY She  reports that she has been smoking cigarettes. She has a 10.00 pack-year smoking history. She has never used smokeless tobacco. She reports that she does not drink alcohol or use drugs.  MEDICARE WELLNESS OBJECTIVES: Physical activity:   Cardiac risk factors:   Depression/mood screen:   Depression screen [facility] 2/9 08/21/2019  Decreased Interest 0  Down, Depressed, Hopeless 0  PHQ - 2 Score 0  Some recent data might be hidden    ADLs:  No flowsheet data found.   Cognitive Testing  Alert? Yes  Normal Appearance?Yes  Oriented to person? Yes  Place? Yes   Time? Yes  Recall of three objects?  Yes  Can perform simple calculations? Yes  Displays appropriate judgment?Yes  Can read the correct time from a watch face?Yes  EOL planning:   Yes  Objective:   Blood pressure 126/74, pulse (!) 52, temperature 97.6 F (36.4 C), height '5\' 6"'$  (1.676 m), weight 189 lb (85.7 kg), SpO2 94 %. Body mass index is 30.51 kg/m.  General appearance: alert, no distress, WD/WN,  female HEENT: normocephalic, sclerae anicteric, TMs pearly, nares patent, no discharge or  erythema, pharynx normal. Oral cavity: MMM, no lesions Neck: supple, no lymphadenopathy, no thyromegaly, no masses Heart: RRR, normal S1, S2, no murmurs Lungs: CTA bilaterally, no wheezes, rhonchi, or rales Abdomen: +bs, soft, non tender, non distended, no masses, no hepatomegaly, no splenomegaly Musculoskeletal: nontender, no swelling, no obvious deformity Extremities: no edema, no cyanosis, no clubbing Pulses: 1+ symmetric, upper and lower extremities, normal cap refill Neurological: alert, oriented x 3, CN2-12 intact, strength normal upper extremities and lower extremities, sensation  decreased throughout bottom of bilateral feet, DTRs 2+ throughout, no cerebellar signs, gait normal Psychiatric: normal affect, behavior normal, pleasant  Breast: defer Gyn: defer Rectal: defer  Medicare Attestation I have personally reviewed: The patient's medical and social history Their use of alcohol, tobacco or illicit drugs Their current medications and supplements The patient's functional ability including ADLs,fall risks, home safety risks, cognitive, and hearing and visual impairment Diet and physical activities Evidence for depression or mood disorders  The patient's weight,  height, BMI, and visual acuity have been recorded in the chart.  I have made referrals, counseling, and provided education to the patient based on review of the above and I have provided the patient with a written personalized care plan for preventive services.     Karen Mutters, PA-C   11/28/2019

## 2019-11-28 ENCOUNTER — Other Ambulatory Visit: Payer: Self-pay

## 2019-11-28 ENCOUNTER — Encounter: Payer: Self-pay | Admitting: Physician Assistant

## 2019-11-28 ENCOUNTER — Ambulatory Visit (INDEPENDENT_AMBULATORY_CARE_PROVIDER_SITE_OTHER): Payer: Medicare Other | Admitting: Physician Assistant

## 2019-11-28 VITALS — BP 126/74 | HR 52 | Temp 97.6°F | Ht 66.0 in | Wt 189.0 lb

## 2019-11-28 DIAGNOSIS — R911 Solitary pulmonary nodule: Secondary | ICD-10-CM

## 2019-11-28 DIAGNOSIS — E1340 Other specified diabetes mellitus with diabetic neuropathy, unspecified: Secondary | ICD-10-CM

## 2019-11-28 DIAGNOSIS — I10 Essential (primary) hypertension: Secondary | ICD-10-CM

## 2019-11-28 DIAGNOSIS — G43109 Migraine with aura, not intractable, without status migrainosus: Secondary | ICD-10-CM | POA: Insufficient documentation

## 2019-11-28 DIAGNOSIS — E1139 Type 2 diabetes mellitus with other diabetic ophthalmic complication: Secondary | ICD-10-CM | POA: Diagnosis not present

## 2019-11-28 DIAGNOSIS — I779 Disorder of arteries and arterioles, unspecified: Secondary | ICD-10-CM

## 2019-11-28 DIAGNOSIS — E1159 Type 2 diabetes mellitus with other circulatory complications: Secondary | ICD-10-CM

## 2019-11-28 DIAGNOSIS — F3341 Major depressive disorder, recurrent, in partial remission: Secondary | ICD-10-CM | POA: Diagnosis not present

## 2019-11-28 DIAGNOSIS — R0602 Shortness of breath: Secondary | ICD-10-CM

## 2019-11-28 DIAGNOSIS — Z79899 Other long term (current) drug therapy: Secondary | ICD-10-CM

## 2019-11-28 DIAGNOSIS — E1169 Type 2 diabetes mellitus with other specified complication: Secondary | ICD-10-CM

## 2019-11-28 DIAGNOSIS — E559 Vitamin D deficiency, unspecified: Secondary | ICD-10-CM

## 2019-11-28 DIAGNOSIS — M25561 Pain in right knee: Secondary | ICD-10-CM

## 2019-11-28 DIAGNOSIS — E1351 Other specified diabetes mellitus with diabetic peripheral angiopathy without gangrene: Secondary | ICD-10-CM | POA: Diagnosis not present

## 2019-11-28 DIAGNOSIS — Z8673 Personal history of transient ischemic attack (TIA), and cerebral infarction without residual deficits: Secondary | ICD-10-CM

## 2019-11-28 DIAGNOSIS — H42 Glaucoma in diseases classified elsewhere: Secondary | ICD-10-CM

## 2019-11-28 DIAGNOSIS — E1122 Type 2 diabetes mellitus with diabetic chronic kidney disease: Secondary | ICD-10-CM

## 2019-11-28 DIAGNOSIS — I671 Cerebral aneurysm, nonruptured: Secondary | ICD-10-CM

## 2019-11-28 DIAGNOSIS — R6889 Other general symptoms and signs: Secondary | ICD-10-CM | POA: Diagnosis not present

## 2019-11-28 DIAGNOSIS — I739 Peripheral vascular disease, unspecified: Secondary | ICD-10-CM

## 2019-11-28 DIAGNOSIS — M25562 Pain in left knee: Secondary | ICD-10-CM

## 2019-11-28 DIAGNOSIS — I70219 Atherosclerosis of native arteries of extremities with intermittent claudication, unspecified extremity: Secondary | ICD-10-CM | POA: Diagnosis not present

## 2019-11-28 DIAGNOSIS — Z0001 Encounter for general adult medical examination with abnormal findings: Secondary | ICD-10-CM

## 2019-11-28 DIAGNOSIS — E785 Hyperlipidemia, unspecified: Secondary | ICD-10-CM

## 2019-11-28 DIAGNOSIS — Z0000 Encounter for general adult medical examination without abnormal findings: Secondary | ICD-10-CM

## 2019-11-28 DIAGNOSIS — Z720 Tobacco use: Secondary | ICD-10-CM

## 2019-11-28 DIAGNOSIS — I517 Cardiomegaly: Secondary | ICD-10-CM

## 2019-11-28 DIAGNOSIS — E042 Nontoxic multinodular goiter: Secondary | ICD-10-CM

## 2019-11-28 DIAGNOSIS — J439 Emphysema, unspecified: Secondary | ICD-10-CM

## 2019-11-28 DIAGNOSIS — N183 Chronic kidney disease, stage 3 unspecified: Secondary | ICD-10-CM

## 2019-11-28 DIAGNOSIS — Z794 Long term (current) use of insulin: Secondary | ICD-10-CM

## 2019-11-28 DIAGNOSIS — I2510 Atherosclerotic heart disease of native coronary artery without angina pectoris: Secondary | ICD-10-CM

## 2019-11-28 DIAGNOSIS — I700 Atherosclerosis of aorta: Secondary | ICD-10-CM

## 2019-11-28 NOTE — Patient Instructions (Addendum)
Going to refer for colonoscopy- DUE 07/2020 Phone: [phone]- Dr. Henrene Pastor  Colon cancer is 3rd most diagnosed cancer and 2nd leading cause of death in both men and women 73 years of age and older despite being one of the most preventable and treatable cancers if found early.  4 of out 5 people diagnosed with colon cancer have NO prior family history.  When caught EARLY 90% of colon cancer is curable.  HOW TO SCHEDULE A MAMMOGRAM  [facility]  7 a.m.-6:30 p.m., Monday 7 a.m.-5 p.m., Tuesday-Friday Schedule an appointment by calling [phone].   Stasis Dermatitis Stasis dermatitis is a long-term (chronic) skin condition that happens when veins can no longer pump blood back to the heart (poor circulation). This condition causes a red or brown scaly rash or sores (ulcers) from the pooling of blood (stasis). This condition usually affects the lower legs. It may affect one leg or both legs. Without treatment, severe stasis dermatitis can lead to other skin conditions and infections. What are the causes? This condition is caused by poor circulation. What increases the risk? You are more likely to develop this condition if:  You are not very active.  You stand for long periods of time.  You have veins that have become enlarged and twisted (varicose veins).  You have leg veins that are not strong enough to send blood back to the heart (venous insufficiency).  You have had a blood clot.  You have been pregnant many times.  You have had vein surgery.  You are obese.  You have heart or kidney failure.  You are 28 years of age or older.  You have had injuries to your legs in the past. What are the signs or symptoms? Common early symptoms of this condition include:  Itchiness in one or both of your legs.  Swelling in your ankle or leg. This might get better overnight but be worse again during the day.  Skin that looks thin on your ankle and  leg.  Red or brown marks that develop slowly.  Skin that is dry, cracked, or easily irritated.  Red, swollen skin that is sore or has a burning feeling.  An achy or heavy feeling after you walk or stand for long periods of time.  Pain. Later and more severe symptoms of this condition include:  Skin that looks shiny.  Small, open sores (ulcers). These are often red or purple and leak fluid.  Skin that feels hard.  Severe itching.  A change in the shape or color of your lower legs.  Severe pain.  Difficulty walking. How is this diagnosed? This condition may be diagnosed based on:  Your symptoms and medical history.  A physical exam. You may also have tests, including:  Blood tests.  Imaging tests to check blood flow (Doppler ultrasound).  Allergy tests. You may need to see a health care provider who specializes in skin diseases (dermatologist). How is this treated? This condition may be treated with:  Compression stockings or an elastic wrap to improve circulation.  Medicines, such as: ? Corticosteroid creams and ointments. ? Non-corticosteroid medicines applied to the skin (topical). ? Medicine to reduce swelling in the legs (diuretics). ? Antibiotics. ? Medicine to relieve itching (antihistamines).  A bandage (dressing).  A wrap that contains zinc and gelatin (Unna boot). Follow these instructions at home: Skin care  Moisturize your skin as told by your health care provider. Do not use moisturizers with fragrance. This can  irritate your skin.  Apply a cool, wet cloth (cool compress) to the affected areas.  Do not scratch your skin.  Do not rub your skin dry after a bath or shower. Gently pat your skin dry.  Do not use scented soaps, detergents, or perfumes. Medicines  Take or use over-the-counter and prescription medicines only as told by your health care provider.  If you were prescribed an antibiotic medicine, take or use it as told by your  health care provider. Do not stop taking or using the antibiotic even if your condition improves. Activity  Walk as told by your health care provider. Walking increases blood flow.  Do calf and ankle exercises throughout the day as told by your health care provider. This will help increase blood flow.  Raise (elevate) your legs above the level of your heart when you are sitting or lying down. Lifestyle  Work with your health care provider to lose weight, if needed.  Do not cross your legs when you sit.  Do not stand or sit in one position for long periods of time.  Wear comfortable, loose-fitting clothing. Circulation in your legs will be worse if you wear tight pants, belts, and waistbands.  Do not use any products that contain nicotine or tobacco, such as cigarettes, e-cigarettes, and chewing tobacco. If you need help quitting, ask your health care provider. General instructions  If you were asked to use one of the following to help with your condition, follow instructions from your health care provider on how to: ? Remove and change any dressing. ? Wear compression stockings. These stockings help to prevent blood clots and reduce swelling in your legs. ? Wear the [employer].  Keep all follow-up visits as told by your health care provider. This is important. Contact a health care provider if:  Your condition does not improve with treatment.  Your condition gets worse.  You have signs of infection in the affected area. Watch for: ? Swelling. ? Tenderness. ? Redness. ? Soreness. ? Warmth.  You have a fever. Get help right away if:  You notice red streaks coming from the affected area.  Your bone or joint underneath the affected area becomes painful after the skin has healed.  The affected area turns darker.  You feel a deep pain in your leg or groin.  You are short of breath. Summary  Stasis dermatitis is a long-term (chronic) skin condition that happens when  veins can no longer pump blood back to the heart (poor circulation).  Wear compression stockings as told by your health care provider. These stockings help to prevent blood clots and reduce swelling in your legs.  Follow instructions from your health care provider about activity, medicines, and lifestyle.  Contact a health care provider if you have a fever or have signs of infection in the affected area.  Keep all follow-up visits as told by your health care provider. This is important. This information is not intended to replace advice given to you by your health care provider. Make sure you discuss any questions you have with your health care provider. Document Revised: 02/06/2018 Document Reviewed: 02/06/2018 Elsevier Patient Education  [place].

## 2019-12-03 ENCOUNTER — Other Ambulatory Visit: Payer: Self-pay

## 2019-12-03 ENCOUNTER — Ambulatory Visit (INDEPENDENT_AMBULATORY_CARE_PROVIDER_SITE_OTHER)
Admission: RE | Admit: 2019-12-03 | Discharge: 2019-12-03 | Disposition: A | Discharge status: Home / Self Care | Payer: Medicare Other | Source: Ambulatory Visit | Attending: Surgery | Admitting: Surgery

## 2019-12-03 ENCOUNTER — Other Ambulatory Visit: Payer: Self-pay | Admitting: Physician Assistant

## 2019-12-03 ENCOUNTER — Ambulatory Visit (HOSPITAL_COMMUNITY)
Admission: RE | Admit: 2019-12-03 | Discharge: 2019-12-03 | Disposition: A | Discharge status: Home / Self Care | Payer: Medicare Other | Source: Ambulatory Visit | Attending: Surgery | Admitting: Surgery

## 2019-12-03 ENCOUNTER — Ambulatory Visit (INDEPENDENT_AMBULATORY_CARE_PROVIDER_SITE_OTHER): Payer: Medicare Other | Admitting: Physician Assistant

## 2019-12-03 VITALS — BP 180/84 | HR 51 | Temp 97.3°F | Resp 16 | Ht 66.0 in | Wt 188.0 lb

## 2019-12-03 DIAGNOSIS — I779 Disorder of arteries and arterioles, unspecified: Secondary | ICD-10-CM

## 2019-12-03 DIAGNOSIS — Z720 Tobacco use: Secondary | ICD-10-CM | POA: Diagnosis not present

## 2019-12-03 DIAGNOSIS — F411 Generalized anxiety disorder: Secondary | ICD-10-CM

## 2019-12-03 DIAGNOSIS — I6529 Occlusion and stenosis of unspecified carotid artery: Secondary | ICD-10-CM | POA: Insufficient documentation

## 2019-12-03 DIAGNOSIS — I6523 Occlusion and stenosis of bilateral carotid arteries: Secondary | ICD-10-CM | POA: Diagnosis not present

## 2019-12-03 NOTE — Progress Notes (Signed)
HISTORY AND PHYSICAL     CC:  follow up. Requesting Provider:  [name], MD  HPI: This is a 74 y.o. female who is here today for follow up.  She has hx of left SFA stenting in September 2012 by Dr. Trula Slade.  She underwent angioplasty in April 2013 and November 2013 for in-stent stenosis.  She had previously been on Pletal but this was discontinued by Dr. Trula Slade as it was not helping with her sx. L SFA stent is known to be occluded.  She has claudication symptoms of both calves after walking short distances.  Patient states these symptoms are tolerable.  She denies rest pain or non healing wounds of BLE.  She is taking aspirin and statin daily.  She is a current smoker trying to quit.  She is also followed for carotid artery stenosis.  She denies any CVA or TIA in the past year.  She also denies any one sided weakness, vision changes, or slurring of speech.  She has hx of cerebral aneurysm.  Her neurologist, Dr. Catalina Gravel follows this. disease.   The pt is on a statin for cholesterol management.    The pt is on an aspirin.    Other AC:  none The pt is on CCB, ARB, BB for hypertension.  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   Past Medical History:  Diagnosis Date  . Abnormal findings on esophagogastroduodenoscopy (EGD) 07/2010  . Aneurysm [facility] 2013   Right Brain   . Aneurysm ([place])    in brain x 2  . Anxiety   . Colon polyps   . Diabetes mellitus 1998  . Diverticulosis   . ESOPHAGEAL STRICTURE 08/27/2008  . GERD (gastroesophageal reflux disease)   . Hemorrhoids   . Hiatal hernia   . Hyperlipidemia   . Hypertension 2000  . Migraines   . Peripheral vascular disease ([place])   . Phlebitis    30 years ago  left leg  . Stroke [facility])    multiple mini strokes ( brain aneurysm )    Past Surgical History:  Procedure Laterality Date  . ABDOMINAL AORTAGRAM N/A 01/04/2012   Procedure: ABDOMINAL Maxcine Ham;  Surgeon: Serafina Mitchell, MD;  Location: [facility] CATH LAB;  Service:  Cardiovascular;  Laterality: N/A;  . ABDOMINAL AORTAGRAM N/A 08/15/2012   Procedure: ABDOMINAL Maxcine Ham;  Surgeon: Serafina Mitchell, MD;  Location: [facility] CATH LAB;  Service: Cardiovascular;  Laterality: N/A;  . ABDOMINAL HYSTERECTOMY  1984  . cataract surgery Right 10-22-2015  . cataract surgery Left 11-05-2015  . CESAREAN SECTION    . CHOLECYSTECTOMY    . COLONOSCOPY  07/2010  . DENTAL SURGERY  Aug. 16, 2013   left lower   . ESOPHAGOGASTRODUODENOSCOPY  07/2010  . EYE SURGERY  Nov. 2014   Laser-Glaucoma  . FEMORAL ARTERY STENT  05/11/11   Left superficial femoral and popliteal artery  . HERNIA REPAIR     times two  . KNEE SURGERY    . LOWER EXTREMITY ANGIOGRAM Left 08/15/2012   Procedure: LOWER EXTREMITY ANGIOGRAM;  Surgeon: Serafina Mitchell, MD;  Location: [facility] CATH LAB;  Service: Cardiovascular;  Laterality: Left;  lt leg angio  . rotator cuff surgery    . THYROID SURGERY      Allergies  Allergen Reactions  . Ciprofloxacin Other (See Comments)  . Ozempic (0.25 Or 0.5 Mg-Dose) [Semaglutide(0.25 Or 0.78m-Dos)] Nausea And Vomiting  . Plavix [Clopidogrel Bisulfate] Palpitations  . Amoxicillin Itching and Swelling    FACE &  EYES SWELL  . Ace Inhibitors   . Ciprofloxacin Hcl Swelling  . Lyrica [Pregabalin]     Itch, gain weight  . Penicillins Other (See Comments)    REACTION: unspecified    Current Outpatient Medications  Medication Sig Dispense Refill  . albuterol (VENTOLIN HFA) 108 (90 Base) MCG/ACT inhaler Inhale 2 puffs into the lungs every 4 (four) hours as needed for wheezing or shortness of breath. 18 g 11  . amLODipine (NORVASC) 10 MG tablet Take 1 tablet Daily for BP 90 tablet 3  . aspirin EC 81 MG tablet Take 81 mg by mouth at bedtime.     . bisoprolol-hydrochlorothiazide (ZIAC) 10-6.25 MG tablet Take 1 tablet by mouth daily. 90 tablet 3  . Blood Glucose Monitoring Suppl (ACCU-CHEK AVIVA PLUS) W/DEVICE KIT Check blood sugar up to three times daily 1 kit 0  .  Cholecalciferol (VITAMIN D PO) Take 5,000 Units by mouth daily.    Marland Kitchen CINNAMON PO Take 1,000 mg by mouth 2 (two) times daily.     . citalopram (CELEXA) 40 MG tablet Take 1 tablet Daily for Mood & Anxiety 90 tablet 3  . clonazePAM (KLONOPIN) 0.5 MG tablet TAKE 1/2-1 TAB AT BEDTIME IF NEEDED FOR SLEEP & TRY TO LIMIT TO 5 DAYS/WEEK TO AVOID ADDICTION 30 tablet 0  . dapagliflozin propanediol (FARXIGA) 10 MG TABS tablet Take 10 mg by mouth daily before breakfast. 90 tablet 3  . glucose blood (ACCU-CHEK AVIVA PLUS) test strip Use daily to check BS TID Dx. E11.22 300 each 1  . insulin NPH-regular Human (NOVOLIN 70/30) (70-30) 100 UNIT/ML injection INJECT 35 UNITSUNDER THE SKIN IN THE MORNING BEFORE FOOD, AND INJECT 30 UNITS BEFORE THE EVENING MEAL. 60 mL 1  . Insulin Syringe-Needle U-100 31G X 15/64" 0.5 ML MISC Use two pens daily with insulin 100 each 3  . Magnesium 500 MG TABS Take 500 mg by mouth daily.    Marland Kitchen olmesartan (BENICAR) 40 MG tablet Take 1 tablet Daily for BP 90 tablet 3  . Omega-3 Fatty Acids (FISH OIL) 1200 MG CAPS Take 1,200 capsules by mouth daily.     Marland Kitchen omeprazole (PRILOSEC) 40 MG capsule TAKE 1 CAPSULE BY MOUTH EVERY DAY 90 capsule 2  . OVER THE COUNTER MEDICATION Take 1 tablet by mouth daily. One a day vitamin Woman's one a day    . rosuvastatin (CRESTOR) 40 MG tablet Take 1 /2 to 1 tablet Daily for Cholesterol 90 tablet 1  . Tiotropium Bromide Monohydrate (SPIRIVA RESPIMAT) 2.5 MCG/ACT AERS Inhale 1 puff into the lungs daily. 4 g 11  . vitamin E 400 UNIT capsule Take 400 Units by mouth daily.     No current facility-administered medications for this visit.    Family History  Problem Relation Age of Onset  . CAD Mother 56       Died of MI  . Hypertension Mother   . Heart attack Mother   . Heart disease Mother   . CAD Father 64       Died of MI  . Heart disease Father   . Heart attack Father   . CAD Brother 71       Two brothers died of MI  . Heart attack Brother   . Heart  disease Brother        Amputation  . Diabetes Sister   . Hypertension Sister   . Heart attack Brother   . Heart disease Brother   . Stroke Sister   .  Colon cancer Neg Hx   . Stomach cancer Neg Hx   . Esophageal cancer Neg Hx     Social History   Socioeconomic History  . Marital status: Married    Spouse name: Not on file  . Number of children: 2  . Years of education: Not on file  . Highest education level: Not on file  Occupational History  . 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 Smoking status: Current Every Day Smoker    Packs/day: 0.25    Years: 40.00    Pack years: 10.00    Types: Cigarettes  . Smokeless tobacco: Never Used  Substance and Sexual Activity  . Alcohol use: No    Alcohol/week: 0.0 standard drinks  . Drug use: No  . Sexual activity: Not on file  Other Topics Concern  . Not on file  Social History Narrative   Lives with husband.        Social Determinants of Health   Financial Resource Strain:   . Difficulty of Paying Living Expenses:   Food Insecurity:   . Worried About Charity fundraiser in the Last Year:   . Arboriculturist in the Last Year:   Transportation Needs:   . Film/video editor (Medical):   Marland Kitchen Lack of Transportation (Non-Medical):   Physical Activity:   . Days of Exercise per Week:   . Minutes of Exercise per Session:   Stress:   . Feeling of Stress :   Social Connections:   . Frequency of Communication with Friends and Family:   . Frequency of Social Gatherings with Friends and Family:   . Attends Religious Services:   . Active Member of Clubs or Organizations:   . Attends Archivist Meetings:   Marland Kitchen Marital Status:   Intimate Partner Violence:   . Fear of Current or Ex-Partner:   . Emotionally Abused:   Marland Kitchen Physically Abused:   . Sexually Abused:      REVIEW OF SYSTEMS:   '[X]'$  denotes positive finding, '[ ]'$  denotes negative finding Cardiac  Comments:  Chest pain or chest  pressure:    Shortness of breath upon exertion:    Short of breath when lying flat:    Irregular heart rhythm:        Vascular    Pain in calf, thigh, or hip brought on by ambulation:    Pain in feet at night that wakes you up from your sleep:     Blood clot in your veins:    Leg swelling:         Pulmonary    Oxygen at home:    Productive cough:     Wheezing:         Neurologic    Sudden weakness in arms or legs:     Sudden numbness in arms or legs:     Sudden onset of difficulty speaking or slurred speech:    Temporary loss of vision in one eye:     Problems with dizziness:         Gastrointestinal    Blood in stool:     Vomited blood:         Genitourinary    Burning when urinating:     Blood in urine:        Psychiatric    Major depression:         Hematologic    Bleeding problems:    Problems with  blood clotting too easily:        Skin    Rashes or ulcers:        Constitutional    Fever or chills:      PHYSICAL EXAMINATION:    General:  WDWN in NAD; vital signs documented above Gait: Not observed HENT: WNL, normocephalic Pulmonary: normal non-labored breathing , without Rales, rhonchi,  wheezing Cardiac: regular HR Abdomen: soft, NT, no masses Skin: without rashes Vascular Exam/Pulses:  Right Left  Radial 2+ (normal) 2+ (normal)  DP absent absent  PT absent absent   Extremities: without ischemic changes, without Gangrene , without cellulitis; without open wounds;  Musculoskeletal: no muscle wasting or atrophy  Neurologic: A&O X 3;  No focal weakness or paresthesias are detected Psychiatric:  The pt has Normal affect.   Non-Invasive Vascular Imaging:   ABI's/TBI's on 12/03/2019: Right:  0.53 - great toe pressure:  0.37 Left:  0.56 - great toe pressure:  0.44  Non-Invasive Vascular Imaging:   Carotid Duplex on 12/03/2019: Right:  1-39% ICA stenosis Left:  1-39% ICA stenosis     ASSESSMENT/PLAN:: 74 y.o. female here for follow up for  carotid artery stenosis and for PAD with hx of  left SFA stenting.  - Claudication symptoms of both calves however this is tolerable - ABIs of 0.5 bilaterally, which is unchanged over the past year - No rest pain or non healing wounds; no indication for revascularization BLE - Encouraged a walking program - Encouraged smoking cessation - Recheck ABI in 1 year   -Carotid duplex unchanged over the past year with 1-39% stenosis of ICA bilaterally -Continue aspirin and statin daily -Recheck carotid duplex in 1 year   Dagoberto Ligas, PA-C [facility] [phone]  Clinic MD:   Trula Slade

## 2019-12-04 ENCOUNTER — Other Ambulatory Visit: Payer: Self-pay | Admitting: *Deleted

## 2019-12-04 DIAGNOSIS — I6523 Occlusion and stenosis of bilateral carotid arteries: Secondary | ICD-10-CM

## 2019-12-04 DIAGNOSIS — Z959 Presence of cardiac and vascular implant and graft, unspecified: Secondary | ICD-10-CM

## 2019-12-04 DIAGNOSIS — I779 Disorder of arteries and arterioles, unspecified: Secondary | ICD-10-CM

## 2019-12-10 ENCOUNTER — Encounter: Payer: Self-pay | Admitting: Cardiology

## 2019-12-10 NOTE — Progress Notes (Signed)
Assessment and Plan:  [name] was seen today for rash.  Diagnoses and all orders for this visit:  Rash Somewhat atypical but cannot r/o shingles, otherwise no obvious specific etiology at this tim; after extended discussion with patient will proceed with treatment to cover shingles with valtrex, prednisone Follow up in 1-2 weeks if unresolved, contact office sooner with any other changes or concerns in the interim Consider lancing upper arm lesions and culture if persistent Discussed with Dr. [name] who is in agreement with current plan of care -     valACYclovir (VALTREX) 1000 MG tablet; Take 1 tablet (1,000 mg total) by mouth 3 (three) times daily. Take for 7 days -     predniSONE (DELTASONE) 20 MG tablet; 2 tablets daily for 3 days, 1 tablet daily for 4 days.  Further disposition pending results of labs. Discussed med's effects and SE's.   Over 15 minutes of exam, counseling, chart review, and critical decision making was performed.   Future Appointments  Date Time Provider [place]  12/31/2019 11:20 AM Minus Breeding, MD [facility]  01/14/2020  2:40 PM Philemon Kingdom, MD [facility] None  06/16/2020 11:00 AM [name], MD [facility] None  12/15/2020 10:00 AM [name], PA-C [facility] None    ------------------------------------------------------------------------------------------------------------------   HPI BP (!) 144/64   Pulse (!) 54   Temp (!) 97.5 F (36.4 C)   Wt 191 lb 9.6 oz (86.9 kg)   SpO2 91%   BMI 30.93 kg/m   74 y.o.female presents for evaluation of pruritic/burning rash to her right R chest, arm and back x 1 week; she reports noted localized puffiness in her chest, and inner R arm, small area to her back, puffiness gradually spread, developed itching, and now having localized burning, "like pins and needles" and very tender. She She reports has been having more headaches than usual, has hx of migraines, having nearly daily in  recent weeks. Admits has been very stressed and has attributed to this. She denies fever/chills, does endorse some fatigue, but this is ongoing for several months. She reports did have an episode of dizziness, felt faint, threw up. This improved after rest. She denies abd pain, constipation/diarrhea. She denies atypical arthralgia/myalgias.   Cat in home but indoor pet, no known insects, doesn't sleep in the bed, doesn't hold on her chest. Husband also sleeps in bed, no rash or concerns. She denies any changes in detergents, personal products.  She denies any recent new medications/changes. No new foods.     Past Medical History:  Diagnosis Date  . Abnormal findings on esophagogastroduodenoscopy (EGD) 07/2010  . Aneurysm [facility]) 2013   Right Brain   . Aneurysm ([place])    in brain x 2  . Anxiety   . Colon polyps   . Diabetes mellitus 1998  . Diverticulosis   . ESOPHAGEAL STRICTURE 08/27/2008  . GERD (gastroesophageal reflux disease)   . Hemorrhoids   . Hiatal hernia   . Hyperlipidemia   . Hypertension 2000  . Migraines   . Peripheral vascular disease ([place])   . Phlebitis    30 years ago  left leg  . Stroke [facility])    multiple mini strokes ( brain aneurysm )     Allergies  Allergen Reactions  . Ciprofloxacin Other (See Comments)  . Ozempic (0.25 Or 0.5 Mg-Dose) [Semaglutide(0.25 Or 0.66m-Dos)] Nausea And Vomiting  . Plavix [Clopidogrel Bisulfate] Palpitations  . Amoxicillin Itching and Swelling    FACE & EYES SWELL  . Ace Inhibitors   .  Ciprofloxacin Hcl Swelling  . Lyrica [Pregabalin]     Itch, gain weight  . Penicillins Other (See Comments)    REACTION: unspecified    Current Outpatient Medications on File Prior to Visit  Medication Sig  . albuterol (VENTOLIN HFA) 108 (90 Base) MCG/ACT inhaler Inhale 2 puffs into the lungs every 4 (four) hours as needed for wheezing or shortness of breath.  Marland Kitchen amLODipine (NORVASC) 10 MG tablet Take 1 tablet Daily for BP  . aspirin EC 81 MG  tablet Take 81 mg by mouth at bedtime.   . bisoprolol-hydrochlorothiazide (ZIAC) 10-6.25 MG tablet Take 1 tablet by mouth daily.  . Blood Glucose Monitoring Suppl (ACCU-CHEK AVIVA PLUS) W/DEVICE KIT Check blood sugar up to three times daily  . Cholecalciferol (VITAMIN D PO) Take 5,000 Units by mouth daily.  Marland Kitchen CINNAMON PO Take 1,000 mg by mouth 2 (two) times daily.   . citalopram (CELEXA) 40 MG tablet Take 1 tablet Daily for Mood & Anxiety  . clonazePAM (KLONOPIN) 0.5 MG tablet TAKE 1/2-1 TAB AT BEDTIME IF NEEDED FOR SLEEP & TRY TO LIMIT TO 5 DAYS/WEEK TO AVOID ADDICTION  . dapagliflozin propanediol (FARXIGA) 10 MG TABS tablet Take 10 mg by mouth daily before breakfast.  . glucose blood (ACCU-CHEK AVIVA PLUS) test strip Use daily to check BS TID Dx. E11.22  . insulin NPH-regular Human (NOVOLIN 70/30) (70-30) 100 UNIT/ML injection INJECT 35 UNITSUNDER THE SKIN IN THE MORNING BEFORE FOOD, AND INJECT 30 UNITS BEFORE THE EVENING MEAL.  Marland Kitchen Insulin Syringe-Needle U-100 31G X 15/64" 0.5 ML MISC Use two pens daily with insulin  . Magnesium 500 MG TABS Take 500 mg by mouth daily.  Marland Kitchen olmesartan (BENICAR) 40 MG tablet Take 1 tablet Daily for BP  . Omega-3 Fatty Acids (FISH OIL) 1200 MG CAPS Take 1,200 capsules by mouth daily.   Marland Kitchen omeprazole (PRILOSEC) 40 MG capsule TAKE 1 CAPSULE BY MOUTH EVERY DAY  . OVER THE COUNTER MEDICATION Take 1 tablet by mouth daily. One a day vitamin Woman's one a day  . rosuvastatin (CRESTOR) 40 MG tablet Take 1 /2 to 1 tablet Daily for Cholesterol  . Tiotropium Bromide Monohydrate (SPIRIVA RESPIMAT) 2.5 MCG/ACT AERS Inhale 1 puff into the lungs daily.  . vitamin E 400 UNIT capsule Take 400 Units by mouth daily.   No current facility-administered medications on file prior to visit.    ROS: all negative except above.   Physical Exam:  BP (!) 144/64   Pulse (!) 54   Temp (!) 97.5 F (36.4 C)   Wt 191 lb 9.6 oz (86.9 kg)   SpO2 91%   BMI 30.93 kg/m   General Appearance:  Well nourished, in no apparent distress. Eyes: PERRLA, conjunctiva no swelling or erythema Sinuses: No Frontal/maxillary tenderness ENT/Mouth: Ext aud canals clear, TMs without erythema, bulging. No erythema, swelling, or exudate on post pharynx.  Tonsils not swollen or erythematous. Hearing normal.  Neck: Supple Respiratory: Respiratory effort normal, BS equal bilaterally without rales, rhonchi, wheezing or stridor.  Cardio: RRR with no MRGs. Brisk peripheral pulses without edema.  Abdomen: Soft, + BS.  Non tender, no guarding. Lymphatics: Non tender without lymphadenopathy.  Musculoskeletal: No obvious deformity, effusions, normal gait Skin: Warm, dryl she has cluster of scabbed lesions to R chest, R upper inner arm, R forearm. Cluster of 2 lesions to R upper back. Appear erythematous base, scabbed. Lesions to R upper inner arm appear vesicular with dark center.  Neuro: Cranial nerves intact. Normal muscle  tone, no cerebellar symptoms. Sensation intact.  Psych: Awake and oriented X 3, normal affect, Insight and Judgment appropriate.     Izora Ribas, NP 9:51 AM [facility]

## 2019-12-11 ENCOUNTER — Encounter: Payer: Self-pay | Admitting: Adult Health

## 2019-12-11 ENCOUNTER — Other Ambulatory Visit: Payer: Self-pay

## 2019-12-11 ENCOUNTER — Ambulatory Visit (INDEPENDENT_AMBULATORY_CARE_PROVIDER_SITE_OTHER): Payer: Medicare Other | Admitting: Adult Health

## 2019-12-11 VITALS — BP 144/64 | HR 54 | Temp 97.5°F | Wt 191.6 lb

## 2019-12-11 DIAGNOSIS — R21 Rash and other nonspecific skin eruption: Secondary | ICD-10-CM

## 2019-12-11 DIAGNOSIS — I779 Disorder of arteries and arterioles, unspecified: Secondary | ICD-10-CM

## 2019-12-11 MED ORDER — VALACYCLOVIR HCL 1 G PO TABS: 1000.0000 mg | ORAL_TABLET | Freq: Three times a day (TID) | ORAL | 0 refills | Status: DC

## 2019-12-11 MED ORDER — PREDNISONE 20 MG PO TABS: ORAL_TABLET | ORAL | 0 refills | Status: DC

## 2019-12-11 NOTE — Patient Instructions (Signed)
Please let me know if not getting better or with any significant changes    Shingles  Shingles, which is also known as herpes zoster, is an infection that causes a painful skin rash and fluid-filled blisters. It is caused by a virus. Shingles only develops in people who:  Have had chickenpox.  Have been given a medicine to protect against chickenpox (have been vaccinated). Shingles is rare in this group. What are the causes? Shingles is caused by varicella-zoster virus (VZV). This is the same virus that causes chickenpox. After a person is exposed to VZV, the virus stays in the body in an inactive (dormant) state. Shingles develops if the virus is reactivated. This can happen many years after the first (initial) exposure to VZV. It is not known what causes this virus to be reactivated. What increases the risk? People who have had chickenpox or received the chickenpox vaccine are at risk for shingles. Shingles infection is more common in people who:  Are older than age 32.  Have a weakened disease-fighting system (immune system), such as people with: ? HIV. ? AIDS. ? Cancer.  Are taking medicines that weaken the immune system, such as transplant medicines.  Are experiencing a lot of stress. What are the signs or symptoms? Early symptoms of this condition include itching, tingling, and pain in an area on your skin. Pain may be described as burning, stabbing, or throbbing. A few days or weeks after early symptoms start, a painful red rash appears. The rash is usually on one side of the body and has a band-like or belt-like pattern. The rash eventually turns into fluid-filled blisters that break open, change into scabs, and dry up in about 2-3 weeks. At any time during the infection, you may also develop:  A fever.  Chills.  A headache.  An upset stomach. How is this diagnosed? This condition is diagnosed with a skin exam. Skin or fluid samples may be taken from the blisters  before a diagnosis is made. These samples are examined under a microscope or sent to a lab for testing. How is this treated? The rash may last for several weeks. There is not a specific cure for this condition. Your health care provider will probably prescribe medicines to help you manage pain, recover more quickly, and avoid long-term problems. Medicines may include:  Antiviral drugs.  Anti-inflammatory drugs.  Pain medicines.  Anti-itching medicines (antihistamines). If the area involved is on your face, you may be referred to a specialist, such as an eye doctor (ophthalmologist) or an ear, nose, and throat (ENT) doctor (otolaryngologist) to help you avoid eye problems, chronic pain, or disability. Follow these instructions at home: Medicines  Take over-the-counter and prescription medicines only as told by your health care provider.  Apply an anti-itch cream or numbing cream to the affected area as told by your health care provider. Relieving itching and discomfort   Apply cold, wet cloths (cold compresses) to the area of the rash or blisters as told by your health care provider.  Cool baths can be soothing. Try adding baking soda or dry oatmeal to the water to reduce itching. Do not bathe in hot water. Blister and rash care  Keep your rash covered with a loose bandage (dressing). Wear loose-fitting clothing to help ease the pain of material rubbing against the rash.  Keep your rash and blisters clean by washing the area with mild soap and cool water as told by your health care provider.  Check your  rash every day for signs of infection. Check for: ? More redness, swelling, or pain. ? Fluid or blood. ? Warmth. ? Pus or a bad smell.  Do not scratch your rash or pick at your blisters. To help avoid scratching: ? Keep your fingernails clean and cut short. ? Wear gloves or mittens while you sleep, if scratching is a problem. General instructions  Rest as told by your health  care provider.  Keep all follow-up visits as told by your health care provider. This is important.  Wash your hands often with soap and water. If soap and water are not available, use hand sanitizer. Doing this lowers your chance of getting a bacterial skin infection.  Before your blisters change into scabs, your shingles infection can cause chickenpox in people who have never had it or have never been vaccinated against it. To prevent this from happening, avoid contact with other people, especially: ? Babies. ? Pregnant women. ? Children who have eczema. ? Elderly people who have transplants. ? People who have chronic illnesses, such as cancer or AIDS. Contact a health care provider if:  Your pain is not relieved with prescribed medicines.  Your pain does not get better after the rash heals.  You have signs of infection in the rash area, such as: ? More redness, swelling, or pain around the rash. ? Fluid or blood coming from the rash. ? The rash area feeling warm to the touch. ? Pus or a bad smell coming from the rash. Get help right away if:  The rash is on your face or nose.  You have facial pain, pain around your eye area, or loss of feeling on one side of your face.  You have difficulty seeing.  You have ear pain or have ringing in your ear.  You have a loss of taste.  Your condition gets worse. Summary  Shingles, which is also known as herpes zoster, is an infection that causes a painful skin rash and fluid-filled blisters.  This condition is diagnosed with a skin exam. Skin or fluid samples may be taken from the blisters and examined before the diagnosis is made.  Keep your rash covered with a loose bandage (dressing). Wear loose-fitting clothing to help ease the pain of material rubbing against the rash.  Before your blisters change into scabs, your shingles infection can cause chickenpox in people who have never had it or have never been vaccinated against  it. This information is not intended to replace advice given to you by your health care provider. Make sure you discuss any questions you have with your health care provider. Document Revised: 12/29/2018 Document Reviewed: 05/11/2017 Elsevier Patient Education  [place].     Valacyclovir caplets What is this medicine? VALACYCLOVIR (val ay SYE kloe veer) is an antiviral medicine. It is used to treat or prevent infections caused by certain kinds of viruses. Examples of these infections include herpes and shingles. This medicine will not cure herpes. This medicine may be used for other purposes; ask your health care provider or pharmacist if you have questions. COMMON BRAND NAME(S): Valtrex What should I tell my health care provider before I take this medicine? They need to know if you have any of these conditions:  acquired immunodeficiency syndrome (AIDS)  any other condition that may weaken the immune system  bone marrow or kidney transplant  kidney disease  an unusual or allergic reaction to valacyclovir, acyclovir, ganciclovir, valganciclovir, other medicines, foods, dyes, or preservatives  pregnant or trying to get pregnant  breast-feeding How should I use this medicine? Take this medicine by mouth with a glass of water. Follow the directions on the prescription label. You can take this medicine with or without food. Take your doses at regular intervals. Do not take your medicine more often than directed. Finish the full course prescribed by your doctor or health care professional even if you think your condition is better. Do not stop taking except on the advice of your doctor or health care professional. Talk to your pediatrician regarding the use of this medicine in children. While this drug may be prescribed for children as young as 2 years for selected conditions, precautions do apply. Overdosage: If you think you have taken too much of this medicine contact a poison  control center or emergency room at once. NOTE: This medicine is only for you. Do not share this medicine with others. What if I miss a dose? If you miss a dose, take it as soon as you can. If it is almost time for your next dose, take only that dose. Do not take double or extra doses. What may interact with this medicine? Do not take this medicine with any of the following medications:  cidofovir This medicine may also interact with the following medications:  adefovir  amphotericin B  certain antibiotics like amikacin, gentamicin, tobramycin, vancomycin  cimetidine  cisplatin  colistin  cyclosporine  foscarnet  lithium  methotrexate  probenecid  tacrolimus This list may not describe all possible interactions. Give your health care provider a list of all the medicines, herbs, non-prescription drugs, or dietary supplements you use. Also tell them if you smoke, drink alcohol, or use illegal drugs. Some items may interact with your medicine. What should I watch for while using this medicine? Tell your doctor or health care professional if your symptoms do not start to get better after 1 week. This medicine works best when taken early in the course of an infection, within the first 44 hours. Begin treatment as soon as possible after the first signs of infection like tingling, itching, or pain in the affected area. It is possible that genital herpes may still be spread even when you are not having symptoms. Always use safer sex practices like condoms made of latex or polyurethane whenever you have sexual contact. You should stay well hydrated while taking this medicine. Drink plenty of fluids. What side effects may I notice from receiving this medicine? Side effects that you should report to your doctor or health care professional as soon as possible:  allergic reactions like skin rash, itching or hives, swelling of the face, lips, or tongue  aggressive  behavior  confusion  hallucinations  problems with balance, talking, walking  stomach pain  tremor  trouble passing urine or change in the amount of urine Side effects that usually do not require medical attention (report to your doctor or health care professional if they continue or are bothersome):  dizziness  headache  nausea, vomiting This list may not describe all possible side effects. Call your doctor for medical advice about side effects. You may report side effects to FDA at 1-800-FDA-1088. Where should I keep my medicine? Keep out of the reach of children. Store at room temperature between 15 and 25 degrees C (59 and 77 degrees F). Keep container tightly closed. Throw away any unused medicine after the expiration date. NOTE: This sheet is a summary. It may not cover all possible information. If  you have questions about this medicine, talk to your doctor, pharmacist, or health care provider.  2020 Elsevier/Gold Standard (2018-10-03 12:22:33)

## 2019-12-20 ENCOUNTER — Other Ambulatory Visit: Payer: Self-pay | Admitting: Internal Medicine

## 2019-12-30 NOTE — Progress Notes (Signed)
Cardiology Office Note   Date:  12/30/2019   ID:  [name], DOB 03/03/1946, MRN [id number]  PCP:  [name], MD  Cardiologist:   Minus Breeding, MD   No chief complaint on file.     History of Present Illness: [name] is a 74 y.o. female who presents for follow up of PVD.  She has had  peripheral vascular disease as below with stenting.  She is not had any prior cardiac history.  She has been noted in the past on CTs to follow-up pulmonary nodules to have aortic atherosclerosis and coronary artery atherosclerosis.  She had a stress test many years ago.  Echo in 2018 demonstrated NL systolic function.   In March 2020 she had negative Lexiscan Myoview.  She is being followed by Dr. Servando Snare for a lung mass.   This was stable on recent imaging and is being followed.     Since I last saw her she has done okay.  She still goes to work and walks up and down stairs.  She is not having any new leg pain, neck or arm discomfort.  She is not having any new shortness of breath, PND or orthopnea.  She has no presyncope or syncope.  She has no weight gain or edema.  She has had some visual disturbances and is having a work-up by ophthalmology but I don't have these notes.  I do see a CT of the head and orbit for evaluation of this.  She does describe some palpitations occasionally.  She feels sporadic skipping in her chest.  Past Medical History:  Diagnosis Date  . Abnormal findings on esophagogastroduodenoscopy (EGD) 07/2010  . 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 Anxiety   . Colon polyps   . Diabetes mellitus 1998  . Diverticulosis   . ESOPHAGEAL STRICTURE 08/27/2008  . GERD (gastroesophageal reflux disease)   . Hemorrhoids   . Hiatal hernia   . Hyperlipidemia   . Hypertension 2000  . Migraines   . Peripheral vascular disease ([place])   . Phlebitis    30 years ago  left leg  . Stroke [facility])    multiple mini strokes ( brain aneurysm )      Past Surgical History:  Procedure Laterality Date  . ABDOMINAL AORTAGRAM N/A 01/04/2012   Procedure: ABDOMINAL Maxcine Ham;  Surgeon: Serafina Mitchell, MD;  Location: [facility] CATH LAB;  Service: Cardiovascular;  Laterality: N/A;  . ABDOMINAL AORTAGRAM N/A 08/15/2012   Procedure: ABDOMINAL Maxcine Ham;  Surgeon: Serafina Mitchell, MD;  Location: [facility] CATH LAB;  Service: Cardiovascular;  Laterality: N/A;  . ABDOMINAL HYSTERECTOMY  1984  . cataract surgery Right 10-22-2015  . cataract surgery Left 11-05-2015  . CESAREAN SECTION    . CHOLECYSTECTOMY    . COLONOSCOPY  07/2010  . DENTAL SURGERY  Aug. 16, 2013   left lower   . ESOPHAGOGASTRODUODENOSCOPY  07/2010  . EYE SURGERY  Nov. 2014   Laser-Glaucoma  . FEMORAL ARTERY STENT  05/11/11   Left superficial femoral and popliteal artery  . HERNIA REPAIR     times two  . KNEE SURGERY    . LOWER EXTREMITY ANGIOGRAM Left 08/15/2012   Procedure: LOWER EXTREMITY ANGIOGRAM;  Surgeon: Serafina Mitchell, MD;  Location: [facility] CATH LAB;  Service: Cardiovascular;  Laterality: Left;  lt leg angio  . rotator cuff surgery    . THYROID SURGERY  Current Outpatient Medications  Medication Sig Dispense Refill  . albuterol (VENTOLIN HFA) 108 (90 Base) MCG/ACT inhaler Inhale 2 puffs into the lungs every 4 (four) hours as needed for wheezing or shortness of breath. 18 g 11  . amLODipine (NORVASC) 10 MG tablet Take 1 tablet Daily for BP 90 tablet 3  . aspirin EC 81 MG tablet Take 81 mg by mouth at bedtime.     . bisoprolol-hydrochlorothiazide (ZIAC) 10-6.25 MG tablet Take 1 tablet by mouth daily. 90 tablet 3  . Blood Glucose Monitoring Suppl (ACCU-CHEK AVIVA PLUS) W/DEVICE KIT Check blood sugar up to three times daily 1 kit 0  . Cholecalciferol (VITAMIN D PO) Take 5,000 Units by mouth daily.    Marland Kitchen CINNAMON PO Take 1,000 mg by mouth 2 (two) times daily.     . citalopram (CELEXA) 40 MG tablet Take 1 tablet Daily for Mood & Anxiety 90 tablet 3  . clonazePAM (KLONOPIN)  0.5 MG tablet TAKE 1/2-1 TAB AT BEDTIME IF NEEDED FOR SLEEP & TRY TO LIMIT TO 5 DAYS/WEEK TO AVOID ADDICTION 30 tablet 0  . dapagliflozin propanediol (FARXIGA) 10 MG TABS tablet Take 10 mg by mouth daily before breakfast. 90 tablet 3  . glucose blood (ACCU-CHEK AVIVA PLUS) test strip Use daily to check BS TID Dx. E11.22 300 each 1  . insulin NPH-regular Human (NOVOLIN 70/30) (70-30) 100 UNIT/ML injection INJECT 35 UNITSUNDER THE SKIN IN THE MORNING BEFORE FOOD, AND INJECT 30 UNITS BEFORE THE EVENING MEAL. 60 mL 1  . Insulin Syringe-Needle U-100 31G X 15/64" 0.5 ML MISC Use two pens daily with insulin 100 each 3  . Magnesium 500 MG TABS Take 500 mg by mouth daily.    Marland Kitchen olmesartan (BENICAR) 40 MG tablet Take 1 tablet Daily for BP 90 tablet 3  . Omega-3 Fatty Acids (FISH OIL) 1200 MG CAPS Take 1,200 capsules by mouth daily.     Marland Kitchen omeprazole (PRILOSEC) 40 MG capsule TAKE 1 CAPSULE BY MOUTH EVERY DAY 90 capsule 2  . OVER THE COUNTER MEDICATION Take 1 tablet by mouth daily. One a day vitamin Woman's one a day    . predniSONE (DELTASONE) 20 MG tablet 2 tablets daily for 3 days, 1 tablet daily for 4 days. 10 tablet 0  . rosuvastatin (CRESTOR) 40 MG tablet TAKE 1 /2 TO 1 TABLET DAILY FOR CHOLESTEROL 90 tablet 1  . Tiotropium Bromide Monohydrate (SPIRIVA RESPIMAT) 2.5 MCG/ACT AERS Inhale 1 puff into the lungs daily. 4 g 11  . valACYclovir (VALTREX) 1000 MG tablet Take 1 tablet (1,000 mg total) by mouth 3 (three) times daily. Take for 7 days 21 tablet 0  . vitamin E 400 UNIT capsule Take 400 Units by mouth daily.     No current facility-administered medications for this visit.    Allergies:   Ciprofloxacin, Ozempic (0.25 or 0.5 mg-dose) [semaglutide(0.25 or 0.79m-dos)], Plavix [clopidogrel bisulfate], Amoxicillin, Ace inhibitors, Ciprofloxacin hcl, Lyrica [pregabalin], and Penicillins    ROS:  Please see the history of present illness.   Otherwise, review of systems are positive for none.   All other  systems are reviewed and negative.    PHYSICAL EXAM:bil VS:  There were no vitals taken for this visit. , BMI There is no height or weight on file to calculate BMI. GENERAL:  Well appearing NECK:  No jugular venous distention, waveform within normal limits, carotid upstroke brisk and symmetric, bilaterbruits, no thyromegaly LUNGS:  Clear to auscultation bilaterally BACK:  No CVA tenderness CHEST:  Unremarkable HEART:  PMI not displaced or sustained,S1 and S2 within normal limits, no S3, no S4, no clicks, no rubs, no murmurs ABD:  Flat, positive bowel sounds normal in frequency in pitch, no bruits, no rebound, no guarding, no midline pulsatile mass, no hepatomegaly, no splenomegaly EXT:  2 plus pulses 2+ posterior tibialis on the right, absent posterior tibialis on the left, absent dorsalis pedis bilaterally, no edema, no cyanosis no clubbing   EKG:  EKG is not ordered today.    Recent Labs: 10/08/2019: ALT 14 10/18/2019: BUN 22; Creat 1.87; Potassium 4.1; Sodium 140    Lipid Panel    Component Value Date/Time   CHOL 132 10/08/2019 1554   TRIG 109.0 10/08/2019 1554   HDL 44.30 10/08/2019 1554   CHOLHDL 3 10/08/2019 1554   VLDL 21.8 10/08/2019 1554   LDLCALC 66 10/08/2019 1554   LDLCALC 73 11/13/2018 1129   LDLDIRECT 146.4 01/30/2010 1056      Wt Readings from Last 3 Encounters:  12/11/19 191 lb 9.6 oz (86.9 kg)  12/03/19 188 lb (85.3 kg)  11/28/19 189 lb (85.7 kg)      Other studies Reviewed: Additional studies/ records that were reviewed today include: PFTs. Review of the above records demonstrates:  Please see elsewhere in the note.     ASSESSMENT AND PLAN:  VISUAL DISTURBANCE: The etiology of this is not clear.  There is no evidence of significant cardiac etiology but I could not exclude an arrhythmia given the fact that she has had some palpitations so she will have a 3-day monitor.  AORTIC CALCIFICATION:   We talked again about risk reduction.  She had a  negative perfusion study last year.  No further work-up.  CORONARY CALCIFICATION:  She had a negative Lexiscan Myoview in May 2020.  Continue with primary risk reduction.  SOB:    She had obstructive disease on PFTs.   She has had follow up with pulmonary.   HTN:     Blood pressure is at target.  No change in therapy.   DYSLIPIDEMIA:Her LDL was 66.  HDL was 44.   DM:Her A1c is down into the sixes by her report which is much better as previously it had been 10.    TOBACCO ABUSE:    She is trying to cut back.   PVD:  She is followed by [facility].    COVID EDUCATION:  She has had her vaccines.    Current medicines are reviewed at length with the patient today.  The patient does not have concerns regarding medicines.  The following changes have been made:  no change  Labs/ tests ordered today include: None  No orders of the defined types were placed in this encounter.    Disposition:   FU with me in 4 months.     Signed, Minus Breeding, MD  12/30/2019 10:46 AM    [place]

## 2019-12-31 ENCOUNTER — Other Ambulatory Visit: Payer: Self-pay

## 2019-12-31 ENCOUNTER — Encounter: Payer: Self-pay | Admitting: Cardiology

## 2019-12-31 ENCOUNTER — Encounter: Payer: Self-pay | Admitting: *Deleted

## 2019-12-31 ENCOUNTER — Ambulatory Visit (INDEPENDENT_AMBULATORY_CARE_PROVIDER_SITE_OTHER): Payer: Medicare Other | Admitting: Cardiology

## 2019-12-31 VITALS — BP 140/52 | HR 52 | Ht 66.0 in | Wt 188.0 lb

## 2019-12-31 DIAGNOSIS — I2584 Coronary atherosclerosis due to calcified coronary lesion: Secondary | ICD-10-CM

## 2019-12-31 DIAGNOSIS — R002 Palpitations: Secondary | ICD-10-CM | POA: Diagnosis not present

## 2019-12-31 DIAGNOSIS — I10 Essential (primary) hypertension: Secondary | ICD-10-CM | POA: Diagnosis not present

## 2019-12-31 DIAGNOSIS — Z794 Long term (current) use of insulin: Secondary | ICD-10-CM

## 2019-12-31 DIAGNOSIS — E1159 Type 2 diabetes mellitus with other circulatory complications: Secondary | ICD-10-CM

## 2019-12-31 DIAGNOSIS — E785 Hyperlipidemia, unspecified: Secondary | ICD-10-CM

## 2019-12-31 DIAGNOSIS — Z7189 Other specified counseling: Secondary | ICD-10-CM

## 2019-12-31 DIAGNOSIS — I2510 Atherosclerotic heart disease of native coronary artery without angina pectoris: Secondary | ICD-10-CM | POA: Diagnosis not present

## 2019-12-31 DIAGNOSIS — Z720 Tobacco use: Secondary | ICD-10-CM

## 2019-12-31 DIAGNOSIS — I700 Atherosclerosis of aorta: Secondary | ICD-10-CM

## 2019-12-31 DIAGNOSIS — I779 Disorder of arteries and arterioles, unspecified: Secondary | ICD-10-CM

## 2019-12-31 NOTE — Progress Notes (Signed)
Patient ID: [name], female   DOB: 10-13-1945, 74 y.o.   MRN: [id number] Patient enrolled for [employer] to ship a 3 day ZIO XT long term holter monitor to her home.  Instructions sent via My Chart message and will also be included in her monitor kit.

## 2019-12-31 NOTE — Patient Instructions (Signed)
Medication Instructions:  No changes *If you need a refill on your cardiac medications before your next appointment, please call your pharmacy*  Lab Work: None ordered this visit  Testing/Procedures: Your physician has recommended that you wear an event monitor. Event monitors are medical devices that record the heart's electrical activity. Doctors most often [place] these monitors to diagnose arrhythmias. Arrhythmias are problems with the speed or rhythm of the heartbeat. The monitor is a small, portable device. You can wear one while you do your normal daily activities. This is usually used to diagnose what is causing palpitations/syncope (passing out).  Follow-Up: At [facility], you and your health needs are our priority.  As part of our continuing mission to provide you with exceptional heart care, we have created designated Provider Care Teams.  These Care Teams include your primary Cardiologist (physician) and Advanced Practice Providers (APPs -  Physician Assistants and Nurse Practitioners) who all work together to provide you with the care you need, when you need it.  Your next appointment:   6 month(s)  You will receive a reminder letter in the mail two months in advance. If you don't receive a letter, please call our office to schedule the follow-up appointment.  The format for your next appointment:   In Person  Provider:   Minus Breeding, MD   Other Instructions Your physician has recommended that you wear a 3 DAY ZIO-PATCH monitor. The Zio patch cardiac monitor continuously records heart rhythm data for up to 14 days, this is for patients being evaluated for multiple types heart rhythms. For the first 24 hours post application, please avoid getting the Zio monitor wet in the shower or by excessive sweating during exercise. After that, feel free to carry on with regular activities. Keep soaps and lotions away from the ZIO XT Patch.  This will be mailed to you, please expect 7-10  days to receive.  Can be placed at our [facility] location - [address].

## 2020-01-02 ENCOUNTER — Other Ambulatory Visit: Payer: Self-pay | Admitting: Physician Assistant

## 2020-01-02 DIAGNOSIS — F411 Generalized anxiety disorder: Secondary | ICD-10-CM

## 2020-01-05 ENCOUNTER — Ambulatory Visit (INDEPENDENT_AMBULATORY_CARE_PROVIDER_SITE_OTHER): Payer: Medicare Other

## 2020-01-05 DIAGNOSIS — R002 Palpitations: Secondary | ICD-10-CM | POA: Diagnosis not present

## 2020-01-07 DIAGNOSIS — M25561 Pain in right knee: Secondary | ICD-10-CM | POA: Insufficient documentation

## 2020-01-07 DIAGNOSIS — M25562 Pain in left knee: Secondary | ICD-10-CM | POA: Diagnosis not present

## 2020-01-11 ENCOUNTER — Encounter: Payer: Self-pay | Admitting: *Deleted

## 2020-01-11 ENCOUNTER — Telehealth: Payer: Self-pay | Admitting: *Deleted

## 2020-01-11 DIAGNOSIS — Z006 Encounter for examination for normal comparison and control in clinical research program: Secondary | ICD-10-CM

## 2020-01-11 NOTE — Research (Signed)
COORDINATE-Diabetes 12 Month CASE REPORT FORM (Intervention) -EHR Site #:   [id number]              Patient ID:        [id number]   6 MONTH EHR REVIEW  Medical Record Check Date 01/01/2020  Vital Status _0 Patient Alive >Date last known alive per EHR: 01/01/2020  _1 Patient Dead >> Complete Death Form  _2 Unknown   CLINICAL EVENTS / PROCEDURES  Hospitalization since last visit? (>=24 hour stay) _3 No _4 Yes  >> if yes, Complete the following  Date of hospital admission:        / /             MM DD YYYY  Primary discharge diagnosis:  *Complete appropriate event validation form _5 acute myocardial infarction (heart attack)* _6 stroke* _7 heart failure* _8 coronary revascularization* _9 peripheral revascularization* _10 cerebral revascularization* _11 diabetes (e.g. hypoglycemia, DKA) _12 renal failure _13 amputation _14 other cardiovascular reason _15 other NON-cardiovascular reason _16 unknown  Other diagnoses not documented above: (check all that apply)  *Complete appropriate event validation form _17 acute myocardial infarction (heart attack)* _18 stroke* _19 heart failure* _20 coronary revascularization* _21 peripheral revascularization* _22 cerebral revascularization* _23 diabetes (e.g. hypoglycemia, DKA) _24 renal failure _25 amputation _26 other cardiovascular reason _27 other NON-cardiovascular reason _28 unknown  Were any of the following outpatient procedures done since the last visit? (I.e. procedures not captured above)  Coronary revascularization _29 No _30 Yes >IF YES, Date / /             MM DD YYYY  Peripheral revascularization _31 No _32 Yes > IF YES, Date / /             MM DD YYYY  Cerebral revascularization _33 No _34 Yes > IF YES, Date / /             MM DD YYYY  Extremity amputation    _35 No _36 Yes >IF YES, Date / /             MM      DD       YYYY  Renal replacement therapy (i.e. dialysis)    _37 No _38 Yes > IF YES, Date of initiation / /             MM      DD       YYYY   **EDC will allow for  collection of multiple hospitalizations and procedures   MEDICATIONS  Medication Currently Prescribed? If started since last visit: If not started since last visit: If stopped since last visit:  Cardiac Medications  ACE Inhibitor / Angiotensin Receptor Blocker (ARB) / Angiotensin Receptor Neprilysin inhibitor (ARNi)  _39 No >  _40 Yes > Since last visit, medication was: _41 Stopped _42 Not started _43 Started _44 Continued same medication _45 Continued with medication changes Date started:        / /             MM DD YYYY Who prescribed? _46 Cardiology provider  _47 Study clinic _48 Outside clinic _49 Endocrinology provider _50 Primary care provider _51 Other provider  Specify:                             _52 Unknown Reason (check all that apply): _53 History of swelling around lips, eyes or face _54 Feeling dizzy/lightheaded _55 Low blood pressure _56 Poor or fluctuating kidney function _57 High potassium _58 Patient has experienced other side effects to this medication  before _59 Patient will be unable to adhere/monitor _60 Patient unable to afford it _61 Patient does not want to      take this medication _62 Pregnancy _63 Other (specify: ) _64 Unknown Reason Date discontinued:        / /  MM DD YYYY Reason (check all that apply): _0 Swelling around lips, eyes       or face _1 Feeling dizzy/lightheaded _2 Low blood pressure _3 Poor or fluctuating kidney function _4 High potassium _5 Other medication side       effects _6 Patient unable to        adhere/monitor _7 Had an       operation/procedure that        required stopping it _8 Patient unable to afford it _9 Patient no longer wants       to take this medication _10 Pregnancy _11 Other (specify: ) _12 Unknown Reason   If started or changed  Medication Name: _13 Benazepril (Lotensin) _14 Captopril (Capoten) _15 Enalapril (Vasotec) _16 Fosinopril (Monopril) _17 Lisinopril (Zestril, Prinivil) _18 Quinapril (Accupril) _19 Ramipril (Altace) _20 Azilsartan  (Edarbi) _21 Candesartan (Atacand) _22 Irbesartan (Avapro) _23 Losartan (Cozaar) _24 Olmesartan (Benicar) _25 Telmisartan (Micardis) _26 Valsartan (Diovan) _27 Sacrubitril/Valsartan (Entresto)     Beta Blocker _28 No _29  Yes > _30 Acebutolol (Sectral) _31 Bisoprolol (Zebeta) _32 Carvedilol (Coreg) _33 Labetalol (Trandate,      Normodyne) _34 Metoprolol succinate (Toprol) _35 Metoprolol tartrate       (Lopressor) _36 Nadolol (Corgard) _37 Nebivolol (Bystolic) <[id number]>_9<\/VXYIAXKPVVZSMOLM>_78 Propranolol (Inderal) _39 Sotalol (Betapace)     Medication Currently Prescribed? If started since last visit: If not started since last visit: If stopped since last visit:  Aldosterone Antagonist _40 No _41 Yes > _42 Amiloride _43 Eplerenone (Inspra) _44 Spirinolactone (Aldactone) _45 Traimterene (Dyrenium)   Calcium Channel Blocker _46 No _47  Yes > Medication Name: _48 Amlodipine (Norvasc) _49 Diltiazem (Cardizem) _50 Felodipine (Plendil) _51 Nifedipine (Procardia) _52 Verapamil (Calan)   Diuretic Loop _53 No _54  Yes > Medication Name: _55 Bumetanide (Bumex) _56 Ethacrynic acid (Edecrin) _57 Furosemide (Lasix) _58 Torsemide (Demadex)   Diuretic Thiazide- type _59 No _60  Yes > Medication Name: _61 Chlorothiazide      _62 Chlorthalidone _63 Hydrochlorothiazide _64 Indapamide _65 Metolazone   Anticoagulation Therapy (other than Warfarin) _66 No _67  Yes > Medication Name: _68 Apixaban (Eliquis) _69 Edoxaban (Lixiana) _70 Rivaroxaban Alen Blew) _71 Dabigatran (Redaxa)   Warfarin _72 No _73  Yes    Antiplatelet Agent (including aspirin) _74 No _75  Yes > Medication Name (check all that apply): _76 Aspirin _77 Clopidogrel (Plavix) _78 Prasugrel (Effient) _79 Ticagrelor (Brilinta) _80 Ticlopidine (Ticlid) _81 Dipyridamole (Persantine)    Medication Currently Prescribed? If started since last visit: If not started since last visit: If stopped since last visit:  Statin  _82 No >  _83 Yes > Since last visit, medication was: _84 Stopped _85 Not started _86 Started _87 Continued  same medication and dose _88 Continued with dose or medication changes Date started:        / /             MM DD YYYY Who prescribed? _89 Cardiology provider _90 Endocrinology provider _91 Primary care provider _92 Other provider  Specify:                             _93 Unknown Reason (check all that apply): _94 History of Rhabdomyolysis _95 LDL-cholesterol already       <70 _96 Muscle      aches/pain/weakness _97 Mental       fogginess/memory loss _98 Liver dysfunction _99 Patient has  experienced other side   effects to this medication before _100 Patient will be unable to adhere/monitor _101 Patient unable to afford it _102 Patient does not want to take this medication _103 Pregnancy _104 Other (specify: ) _105 Unknown Reason Date discontinued:        / /             MM DD YYYY Reason (check all that apply): _106 Rhabdomyolysis _107 Muscle aches/pain/weakness _108 Mental fogginess/memory loss _109 Liver dysfunction _110 Other medication side effects _111 Patient unable to          adhere/monitor _112 Patient unable to afford it _113 Patient no longer wants to take  this medication _0 Pregnancy _1 Other (specify: ) _2 Unknown Reason   If started or changed  Medication Name: _3 Atorvastatin (Lipitor) _4 Fluvastatin (Lescol) _5 Lovastatin (Mevacor) _6 Pravastatin (Pravachol) _7 Rosuvastatin (Crestor) _8 Simvastatin (Zocor) _9 Pitatavastatin (Livalo)  Dose: _10 1 mg _11 10 mg _12 2 mg _13  20 mg _14 3 mg _15  40 mg _16 4 mg _17  60 mg _18 5 mg _19  80 mg  Frequency: _20 Daily  _21 Less than daily      Does the patient have statin intolerance that prevents the use of maximum dose of high potency statin? _22 No _23 Yes >IF YES, Complete Statin Intolerance form   Non-statin lipid lowering therapy _24 No _25 Yes > Medication Name (check all that apply): _26 Colesevelam (Welchol) _27 Ezetimibe (Zetia) _28 Fibrate _29 Niacin _30 PCSK9 inhibitor _31 Omega 3 acid ethyl esters (Lovaza) _32 Icosapent Ethyl (Vascepa) _33 Over the counter omega 3 fatty acid  or fish oil supplement     Medication Currently Prescribed? If started since last visit: If not started since last visit: If stopped since last visit:  Diabetes Medications  SGLT2 Inhibitor  _34 No >  _35 Yes > Since last visit, medication was: _36 Stopped _37 Not started _38 Started _39 Continued same medication _40 Continued with medication changes Date started:        / /             MM DD YYYY Who prescribed? _41 Cardiology provider  _42 Study clinic _43 Outside clinic _44 Endocrinology      provider _45 Primary care provider _46 Other provider  Specify:                             _47 Unknown Reason (check all that apply): _48 eGFR <45 _49 HbA1c<7% on metformin monotherapy OR already on GLP1RA and do not need to start another anti- hyperglycemic _50 Already dehydrated _51 Low blood pressure _52 High risk of Hypoglycemia _53 Prior DKA _54 Recurrent mycotic genital infections _55 History of or at risk for amputation _56 Patient has experienced other side effects to this medication before _57 Patient will be unable to adhere/monitor _58 Patient unable to afford it _59 Patient does not want to take this medication _60 Pregnancy _61 Other (specify: ) _62 Unknown Reason Date discontinued:        / /             MM DD YYYY Reason (check all that apply  _63 eGFR now <45 _64 Dehydration _65 Low blood pressure _66 Hypoglycemia _67 DKA _68 Mycotic genital infection _69 Amputation _70 Other medication side effects _71 Patient unable    to adhere/monitor  _72 Had an operation/procedure     that required stopping it _73 Patient unable to afford it _74 Patient no longer wants to      take this medication _75 Pregnancy _76 Other (specify: ) _77 Unknown Reason   If started or changed  Medication Name: _78 Canaglifozin (Invokana) _79 Dapagliflozin Wilder Glade) _80 Empaglifozin (Jardiance) _81 Ertugliflozin Actuary)           Medication Currently Prescribed? If started since last visit: If not started since last visit: If stopped since last  visit:  GLP1 Receptor Agonist  _82 No >  _83 Yes > Since last visit, medication was: _84 Stopped _85 Not started _86 Started _87 Continued same medication _88 Continued with medication changes Date started:        / /             MM DD YYYY Who prescribed? _89 Cardiology provider  _90 Study clinic _91 Outside clinic _92 Endocrinology       provider _93 Primary care provider _94 Other provider > Specify:                             _95 Unknown Reason (check all that apply): _96 Personal or family  history of medullary thyroid cancer _0 MEN2 _1 HbA1c<7% on metformin monotherapy OR already on SGLT2i and do not need to start another anti-hyperglycemic _2 eGFR now <30 _3 High risk of Hypoglycemia _4 History of pancreatitis _5 Significant gastroparesis _6 Prior gastric surgery _7 Patient has experienced other side effects to this medication before _8 Patient will be unable to adhere/monitor _9 Patient unable to afford it _10 Patient does not want to take this medication _11 Pregnancy _12 Other (specify: ) _13 Unknown Reason Date discontinued:        / /             MM DD YYYY Reason (check all that apply): _14 Medullary thyroid cancer _15 MEN2 _16 eGFR now <30 _17 Hypoglycemia _18 Pancreatitis _19 Significant gastroparesis _20 Gastric surgery _21 Other medication side       effects _22   Patient  unable    to adhere/monitor                                  _23 Had an operation/procedure that required stopping it _24 Patient unable to afford it _25 Patient no longer wants to take this medication _26 Pregnancy _27 Other (specify: ) _28 Unknown Reason   If started or changed > Medication Name: _29 Albiglutide (Tanzeum) _30 Dulaglutide (Trulicity) <[id number]>_2<\/LNLGXQJJHERDEYCX>_44 Exanatide (Byetta, Bydureon) _32 Liraglutide (Victoza, Saxenda) _33 Lixisenatide (Adlyxin) _34 Semaglutice (Ozempic)      Medication Currently Prescribed? If started since last visit: If not started since last visit: If stopped since last visit:  Other non Insulin diabetes medications _35 No  _36 Yes > Medication Name (check all that apply): _37 Acarbose (Precose) _38 Miglitol (Glyset) _39 Glimepiride (Amaryl) _40 Glipizide (Amaryl) _41 Glyburide (Diabeta,       Glynase,   Micronase) _42 Metformin (Fortamet,        Glucophage[including XR],        Glumetza, Riomet) _43 Pioglitazone (Actos) _44 Nateglinide (Starlix) _45 Pramlintide (Symilin) _46 Repaglinide (Prandin) _47 Rosiglitazone (Avandia) _48 Alogliptin (Nesina) _49 Linagliptin (Tradjenta) _50 Saxagliptin (Onglyza) _51 Sitagliptin (Januvia) _52 Bromocriptine Quick Release (Cycloset)     Insulin _53 No _54  Yes > total daily dose: 65 units     STATIN INTOLERANCE (PER EHR/OTHER SOURCE DATA)  1. Was CK checked? _55 No _56 Yes   >If yes, select from the following: _57 CK not elevated _58 CK elevated 1-5x upper limit of normal _59 CK elevated >5x upper limit of normal  2. Does the patient have muscle symptoms? _60 No _61 Yes    >If yes, select from the following: Location and pattern of muscle symptoms (select all that apply) _62 Symmetric, hip flexors or thighs _63 Symmetric, calves _64 Symmetric, proximal upper extremity _65 Asymmetric, intermittent, or not specific to any area _66 Unknown   Timing of muscle symptom in relation to starting statin regimen _67 <4 weeks _68 4-12 weeks _69 >12 weeks _70 Unknown   Timing of muscle symptoms improvement after withdrawal of statin _71 <2 weeks _72 2-4 weeks _73 No improvement after 4 weeks _74 Unknown  3. Was patient re-challenged with a statin regimen (even if same statin compound or regimen as above)?  _75 No  _76 Yes  _77 Unknown  >If yes, select from the following: Timing of recurrence of similar muscle symptoms in relation to starting second regimen _78 <4 weeks _79 4-12 weeks _80 >12 weeks _81 Similar symptoms did not recur _82 Unknown   [email or web address]

## 2020-01-11 NOTE — Telephone Encounter (Signed)
Open in error

## 2020-01-14 ENCOUNTER — Ambulatory Visit (INDEPENDENT_AMBULATORY_CARE_PROVIDER_SITE_OTHER): Payer: Medicare Other | Admitting: Internal Medicine

## 2020-01-14 ENCOUNTER — Other Ambulatory Visit: Payer: Self-pay

## 2020-01-14 ENCOUNTER — Encounter: Payer: Self-pay | Admitting: Internal Medicine

## 2020-01-14 VITALS — BP 140/70 | HR 55 | Ht 66.0 in | Wt 189.0 lb

## 2020-01-14 DIAGNOSIS — Z794 Long term (current) use of insulin: Secondary | ICD-10-CM

## 2020-01-14 DIAGNOSIS — E1122 Type 2 diabetes mellitus with diabetic chronic kidney disease: Secondary | ICD-10-CM | POA: Diagnosis not present

## 2020-01-14 DIAGNOSIS — E1159 Type 2 diabetes mellitus with other circulatory complications: Secondary | ICD-10-CM | POA: Diagnosis not present

## 2020-01-14 DIAGNOSIS — E1169 Type 2 diabetes mellitus with other specified complication: Secondary | ICD-10-CM | POA: Diagnosis not present

## 2020-01-14 DIAGNOSIS — E785 Hyperlipidemia, unspecified: Secondary | ICD-10-CM

## 2020-01-14 DIAGNOSIS — I779 Disorder of arteries and arterioles, unspecified: Secondary | ICD-10-CM

## 2020-01-14 DIAGNOSIS — N183 Chronic kidney disease, stage 3 unspecified: Secondary | ICD-10-CM

## 2020-01-14 LAB — POCT GLYCOSYLATED HEMOGLOBIN (HGB A1C): Hemoglobin A1C: 9.8 % — AB (ref 4.0–5.6)

## 2020-01-14 MED ORDER — NOVOLOG FLEXPEN 100 UNIT/ML ~~LOC~~ SOPN: 6.0000 [IU] | PEN_INJECTOR | Freq: Three times a day (TID) | SUBCUTANEOUS | 11 refills | Status: DC

## 2020-01-14 MED ORDER — TRESIBA FLEXTOUCH 200 UNIT/ML ~~LOC~~ SOPN: PEN_INJECTOR | SUBCUTANEOUS | 5 refills | Status: DC

## 2020-01-14 NOTE — Progress Notes (Signed)
Patient ID: [name], female   DOB: 11-12-1945, 74 y.o.   MRN: [id number]   This visit occurred during the SARS-CoV-2 public health emergency.  Safety protocols were in place, including screening questions prior to the visit, additional usage of staff PPE, and extensive cleaning of exam room while observing appropriate contact time as indicated for disinfecting solutions.   HPI: [name] is a 74 y.o.-year-old female, initially referred by her cardiologist, Dr.Hochrein, presenting for follow-up for DM2, dx in ~2000, insulin-dependent since ~2005, uncontrolled, with complications (PVD - s/p stents and fem-pop bypass; h/o CVA x3; CKD stage 3b; DR; PN). She saw my colleague, Dr. Loanne Drilling many years ago.  Last visit with me 3 mo ago. PCP: [name], PA  She was on steroids 3 weeks ago for poss. Shingles >> sugars higher.  Reviewed HbA1c levels: HbA1c calculated from fructosamine: 6.9% Lab Results  Component Value Date   HGBA1C 9.1 (A) 07/09/2019   HGBA1C 10.9 (H) 11/13/2018   HGBA1C 9.7 (H) 07/19/2018   HGBA1C 8.7 (H) 04/18/2018   HGBA1C 8.3 (H) 01/03/2018   HGBA1C 10.0 (H) 09/27/2017   HGBA1C 10.5 (H) 05/04/2017   HGBA1C 8.9 02/25/2017   HGBA1C 10.9 (H) 11/10/2016   HGBA1C 9.3 (H) 07/22/2016   Component     Latest Ref Rng & Units 07/22/2016  Glutamic Acid Decarb Ab     <5 IU/mL <5   Current regimen: - Novolog 70/30 35 units before breakfast and 30 units before dinner - injecting hips and arms due to abdominal pain - Farxiga 5 mg in am - started 06/2019 >> 10 mg daily - increased 09/2019 She could not tolerate Ozempic (started 11/2018) due to nausea, vomiting, abdominal pain-stopped 04/2019. She was on Invokana >> intolerance 2/2 weight loss and dehydration >> hospitalization. She was on Metformin and sulfonylurea at diagnosis. She was also on Januvia at the same time with Invokana >> stopped along with Invokana   Pt checks her sugars ~2x a day: - am: 168-296 >> 149-172  >> 119-150 >> 86-138 >> 88, 102-210, 253, 283 - 2h after b'fast: 326 >> n/c >> 326 (10/2019) - before lunch: 265 >> 124-172 >> n/c - 2h after lunch: 280, 286 >> n/c - before dinner: 176, 244 >> 119-156 >> 166-180s >> 68-157 >> 73-193, 215 - 2h after dinner: 149-183, 203 >> 247 >> (late dinner) 196- 201 >> 172 - bedtime: n/c - nighttime: n/c Lowest sugar was 150 >> 119 >> 119 >> 68 >> 73; she has hypoglycemia awareness at 100. Highest sugar was 300s >> 203 >> 247 (cake) >> 201 >> 283 (steroid).  Glucometer: [place]  Pt's meals are: - Breakfast: eggs or cheese sandwich - Lunch: half a sandwich, apple - Dinner 9-9:30 pm: chicken, veggies - Snacks: stopped cheese; now apples and pickles She works part-time, from 10 am - 2 pm Tue-Fri.   -+ CKD stage IIIb, last BUN/creatinine:  Lab Results  Component Value Date   BUN 22 10/18/2019   BUN 33 (H) 10/08/2019   CREATININE 1.87 (H) 10/18/2019   CREATININE 1.85 (H) 10/08/2019   Lab Results  Component Value Date   GFRAA 31 (L) 10/18/2019   GFRAA 31 (L) 10/08/2019   GFRAA 37 (L) 11/13/2018   GFRAA 42 (L) 08/28/2018   GFRAA 46 (L) 07/19/2018   On olmesartan 40.  -+ HL; last set of lipids: Lab Results  Component Value Date   CHOL 132 10/08/2019   HDL 44.30 10/08/2019   [place]  66 10/08/2019   LDLDIRECT 146.4 01/30/2010   TRIG 109.0 10/08/2019   CHOLHDL 3 10/08/2019  On Crestor 40, omega-3 fatty acids 1200 g daily.  - last eye exam was in 10/2017: + DR, + glaucoma  -+ Numbness and tingling in her feet.  She has a callus on the left foot.  She sees a podiatrist.  She did not tolerate Lyrica.  On ASA 81.  Pt has FH of DM in sister.  She has a history of nontoxic multinodular goiter.  Latest TSH was reviewed and this was normal: Lab Results  Component Value Date   TSH 1.65 11/13/2018   She also has a history of HTN, brain aneurysm, emphysema, esophageal stricture, GERD.  She had hernia repair surgery- had mesh placed >>  persistent abdominal pain.  Her husband's nephew, [name], was also my patient and he died 2 mo ago after he stopped going to dialysis.  ROS: Constitutional: no weight gain/no weight loss, no fatigue, no subjective hyperthermia, no subjective hypothermia Eyes: no blurry vision, no xerophthalmia ENT: no sore throat, no nodules palpated in neck, no dysphagia, no odynophagia, no hoarseness Cardiovascular: no CP/no SOB/no palpitations/no leg swelling Respiratory: no cough/no SOB/no wheezing Gastrointestinal: no N/no V/no D/no C/no acid reflux Musculoskeletal: no muscle aches/+ Joint aches Skin: no rashes, no hair loss Neurological: no tremors/no numbness/no tingling/no dizziness, + HA  I reviewed pt's medications, allergies, PMH, social hx, family hx, and changes were documented in the history of present illness. Otherwise, unchanged from my initial visit note.  Past Medical History:  Diagnosis Date  . Abnormal findings on esophagogastroduodenoscopy (EGD) 07/2010  . Aneurysm [facility]) 2013   Right Brain   . Aneurysm ([place])    in brain x 2  . Anxiety   . Colon polyps   . Diabetes mellitus 1998  . Diverticulosis   . ESOPHAGEAL STRICTURE 08/27/2008  . GERD (gastroesophageal reflux disease)   . Hemorrhoids   . Hiatal hernia   . Hyperlipidemia   . Hypertension 2000  . Migraines   . Peripheral vascular disease ([place])   . Phlebitis    30 years ago  left leg  . Stroke [facility])    multiple mini strokes ( brain aneurysm )   Past Surgical History:  Procedure Laterality Date  . ABDOMINAL AORTAGRAM N/A 01/04/2012   Procedure: ABDOMINAL Maxcine Ham;  Surgeon: Serafina Mitchell, MD;  Location: [facility] C[facility] LAB;  Service: Cardiovascular;  Laterality: N/A;  . ABDOMINAL AORTAGRAM N/A 08/15/2012   Procedure: ABDOMINAL Maxcine Ham;  Surgeon: Serafina Mitchell, MD;  Location: [facility] CATH LAB;  Service: Cardiovascular;  Laterality: N/A;  . ABDOMINAL HYSTERECTOMY  1984  . cataract surgery Right 10-22-2015  .  cataract surgery Left 11-05-2015  . CESAREAN SECTION    . CHOLECYSTECTOMY    . COLONOSCOPY  07/2010  . DENTAL SURGERY  Aug. 16, 2013   left lower   . ESOPHAGOGASTRODUODENOSCOPY  07/2010  . EYE SURGERY  Nov. 2014   Laser-Glaucoma  . FEMORAL ARTERY STENT  05/11/11   Left superficial femoral and popliteal artery  . HERNIA REPAIR     times two  . KNEE SURGERY    . LOWER EXTREMITY ANGIOGRAM Left 08/15/2012   Procedure: LOWER EXTREMITY ANGIOGRAM;  Surgeon: Serafina Mitchell, MD;  Location: [facility] CATH LAB;  Service: Cardiovascular;  Laterality: Left;  lt leg angio  . rotator cuff surgery    . THYROID SURGERY     Social History   Socioeconomic History  .  Marital status: Married    Spouse name: Not on file  . Number of children: 2  . Years of education: Not on file  . Highest education level: Not on file  Occupational History  . 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 Smoking status: Current Every Day Smoker    Packs/day: 0.25    Years: 40.00    Pack years: 10.00    Types: Cigarettes  . Smokeless tobacco: Never Used  Substance and Sexual Activity  . Alcohol use: No    Alcohol/week: 0.0 standard drinks  . Drug use: No  . Sexual activity: Not on file  Other Topics Concern  . Not on file  Social History Narrative   Lives with husband.        Social Determinants of Health   Financial Resource Strain:   . Difficulty of Paying Living Expenses:   Food Insecurity:   . Worried About Charity fundraiser in the Last Year:   . Arboriculturist in the Last Year:   Transportation Needs:   . Film/video editor (Medical):   Marland Kitchen Lack of Transportation (Non-Medical):   Physical Activity:   . Days of Exercise per Week:   . Minutes of Exercise per Session:   Stress:   . Feeling of Stress :   Social Connections:   . Frequency of Communication with Friends and Family:   . Frequency of Social Gatherings with Friends and Family:   . Attends Religious Services:    . Active Member of Clubs or Organizations:   . Attends Archivist Meetings:   Marland Kitchen Marital Status:   Intimate Partner Violence:   . Fear of Current or Ex-Partner:   . Emotionally Abused:   Marland Kitchen Physically Abused:   . Sexually Abused:    Current Outpatient Medications on File Prior to Visit  Medication Sig Dispense Refill  . albuterol (VENTOLIN HFA) 108 (90 Base) MCG/ACT inhaler Inhale 2 puffs into the lungs every 4 (four) hours as needed for wheezing or shortness of breath. 18 g 11  . amLODipine (NORVASC) 10 MG tablet Take 1 tablet Daily for BP 90 tablet 3  . aspirin EC 81 MG tablet Take 81 mg by mouth at bedtime.     . bisoprolol-hydrochlorothiazide (ZIAC) 10-6.25 MG tablet Take 1 tablet by mouth daily. 90 tablet 3  . Blood Glucose Monitoring Suppl (ACCU-CHEK AVIVA PLUS) W/DEVICE KIT Check blood sugar up to three times daily 1 kit 0  . Cholecalciferol (VITAMIN D PO) Take 5,000 Units by mouth daily.    Marland Kitchen CINNAMON PO Take 1,000 mg by mouth 2 (two) times daily.     . citalopram (CELEXA) 40 MG tablet Take 1 tablet Daily for Mood & Anxiety 90 tablet 3  . clonazePAM (KLONOPIN) 0.5 MG tablet TAKE 1/2-1 TAB AT BEDTIME IF NEEDED FOR SLEEP & TRY TO LIMIT TO 5 DAYS/WEEK TO AVOID ADDICTION 30 tablet 0  . dapagliflozin propanediol (FARXIGA) 10 MG TABS tablet Take 10 mg by mouth daily before breakfast. 90 tablet 3  . glucose blood (ACCU-CHEK AVIVA PLUS) test strip Use daily to check BS TID Dx. E11.22 300 each 1  . insulin NPH-regular Human (NOVOLIN 70/30) (70-30) 100 UNIT/ML injection INJECT 35 UNITSUNDER THE SKIN IN THE MORNING BEFORE FOOD, AND INJECT 30 UNITS BEFORE THE EVENING MEAL. 60 mL 1  . Insulin Syringe-Needle U-100 31G X 15/64" 0.5 ML MISC Use two pens daily with insulin 100 each 3  .  Magnesium 500 MG TABS Take 500 mg by mouth daily.    Marland Kitchen olmesartan (BENICAR) 40 MG tablet Take 1 tablet Daily for BP 90 tablet 3  . Omega-3 Fatty Acids (FISH OIL) 1200 MG CAPS Take 1,200 capsules by mouth  daily.     Marland Kitchen omeprazole (PRILOSEC) 40 MG capsule TAKE 1 CAPSULE BY MOUTH EVERY DAY 90 capsule 2  . OVER THE COUNTER MEDICATION Take 1 tablet by mouth daily. One a day vitamin Woman's one a day    . rosuvastatin (CRESTOR) 40 MG tablet TAKE 1 /2 TO 1 TABLET DAILY FOR CHOLESTEROL 90 tablet 1  . Tiotropium Bromide Monohydrate (SPIRIVA RESPIMAT) 2.5 MCG/ACT AERS Inhale 1 puff into the lungs daily. 4 g 11  . valACYclovir (VALTREX) 1000 MG tablet Take 1 tablet (1,000 mg total) by mouth 3 (three) times daily. Take for 7 days 21 tablet 0  . vitamin E 400 UNIT capsule Take 400 Units by mouth daily.     No current facility-administered medications on file prior to visit.   Allergies  Allergen Reactions  . Ciprofloxacin Other (See Comments)  . Ozempic (0.25 Or 0.5 Mg-Dose) [Semaglutide(0.25 Or 0.'5mg'$ -Dos)] Nausea And Vomiting  . Plavix [Clopidogrel Bisulfate] Palpitations  . Amoxicillin Itching and Swelling    FACE & EYES SWELL  . Ace Inhibitors   . Ciprofloxacin Hcl Swelling  . Lyrica [Pregabalin]     Itch, gain weight  . Penicillins Other (See Comments)    REACTION: unspecified   Family History  Problem Relation Age of Onset  . CAD Mother 79       Died of MI  . Hypertension Mother   . Heart attack Mother   . Heart disease Mother   . CAD Father 65       Died of MI  . Heart disease Father   . Heart attack Father   . CAD Brother 11       Two brothers died of MI  . Heart attack Brother   . Heart disease Brother        Amputation  . Diabetes Sister   . Hypertension Sister   . Heart attack Brother   . Heart disease Brother   . Stroke Sister   . Colon cancer Neg Hx   . Stomach cancer Neg Hx   . Esophageal cancer Neg Hx     PE: BP 140/70   Pulse (!) 55   Ht '5\' 6"'$  (1.676 m)   Wt 189 lb (85.7 kg)   SpO2 94%   BMI 30.51 kg/m  Wt Readings from Last 3 Encounters:  01/14/20 189 lb (85.7 kg)  12/31/19 188 lb (85.3 kg)  12/11/19 191 lb 9.6 oz (86.9 kg)   Constitutional:  overweight, in NAD Eyes: PERRLA, EOMI, no exophthalmos ENT: moist mucous membranes, no thyromegaly, no cervical lymphadenopathy Cardiovascular: RRR, No MRG Respiratory: CTA B Gastrointestinal: abdomen soft, NT, ND, BS+ Musculoskeletal: no deformities, strength intact in all 4 Skin: moist, warm, no rashes Neurological: no tremor with outstretched hands, DTR normal in all 4  ASSESSMENT: 1. DM2, insulin-dependent, uncontrolled, with complications - PVD - s/p stents and fem-pop bypass - h/o CVA - CKD stage 3b - DR - PN  2.  Hyperlipidemia  3.  Obesity class 1  PLAN:  1. Patient with longstanding, uncontrolled, type 2 diabetes, on premixed insulin regimen and SGLT 2 inhibitor.  We are using Farxiga at target dose now, increased at last visit.  We have initially started at a low  dose since she has a history of UTIs.  We did not use Invokana due to severe PVD.  She also did not tolerate this due to severe dehydration.  She also tried Ozempic and could not tolerate it due to GI symptoms.  She would not want to try Trulicity. -At this visit, her sugars are higher and we again discussed that the premixed insulin regimen is not adjustable and not as efficacious as a basal-bolus insulin regimen.  At this visit, after reviewing this regimen in detail, including types of insulin, timing of the insulins, doses, she finally agrees to give this regimen a try.  We will try to start [place] and NovoLog. -I again brought up the possibility of using a Vgo pump, since she complained about the number of injections a day, but she refuses this. - I suggested to:  Patient Instructions  Please change from  Novolog 70/30 to: - Tresiba 34 units daily - Novolog 6-8 units 2x day before b'fast and dinner (may need this before lunch, also)  Please continue: - Farxiga 10 mg daily before b'fast  Please stop at the lab.  Please return in 3 months with your sugar log.   - we checked her HbA1c: 9.8% (higher) - we  will also check a fructosamine level - advised to check sugars at different times of the day - 3x a day, rotating check times - advised for yearly eye exams >> she is not UTD - We will repeat the kidney function today - return to clinic in 3 months  2. HL -Reviewed latest lipid panel from earlier this year: All fractions at goal Lab Results  Component Value Date   CHOL 132 10/08/2019   HDL 44.30 10/08/2019   LDLCALC 66 10/08/2019   LDLDIRECT 146.4 01/30/2010   TRIG 109.0 10/08/2019   CHOLHDL 3 10/08/2019  -Continue Crestor and omega-3 fatty acids without signs of  3.  Obesity class 1 -We unfortunately had to stop Ozempic due to intolerance -We will continue Farxiga, which should also help with weight loss -Since last visit, she did not lose weight  Component     Latest Ref Rng & Units 01/14/2020          Sodium     135 - 145 mEq/L 139  Potassium     3.5 - 5.1 mEq/L 4.2  Chloride     96 - 112 mEq/L 102  CO2     19 - 32 mEq/L 32  Glucose     70 - 99 mg/dL 176 (H)  BUN     6 - 23 mg/dL 26 (H)  Creatinine     0.40 - 1.20 mg/dL 1.81 (H)  GFR     >60.00 mL/min 27.39 (L)  Calcium     8.4 - 10.5 mg/dL 9.4  Hemoglobin A1C     4.0 - 5.6 % 9.8 (A)  Fructosamine     205 - 285 umol/L 335 (H)   Kidney function low, but stable. HbA1c calculated from fructosamine 7.3%, much better than the directly measured one.  Philemon Kingdom, MD PhD [facility] Endocrinology

## 2020-01-14 NOTE — Patient Instructions (Addendum)
Please change from  Novolog 70/30 to: - Tresiba 34 units daily - Novolog 6-8 units 2x day before b'fast and dinner (may need this before lunch, also)  Please continue: - Farxiga 10 mg daily before b'fast  Please stop at the lab.  Please return in 3 months with your sugar log.

## 2020-01-15 LAB — BASIC METABOLIC PANEL
BUN: 26 mg/dL — ABNORMAL HIGH (ref 6–23)
CO2: 32 mEq/L (ref 19–32)
Calcium: 9.4 mg/dL (ref 8.4–10.5)
Chloride: 102 mEq/L (ref 96–112)
Creatinine, Ser: 1.81 mg/dL — ABNORMAL HIGH (ref 0.40–1.20)
GFR: 27.39 mL/min — ABNORMAL LOW (ref >60.00)
Glucose, Bld: 176 mg/dL — ABNORMAL HIGH (ref 70–99)
Potassium: 4.2 mEq/L (ref 3.5–5.1)
Sodium: 139 mEq/L (ref 135–145)

## 2020-01-16 ENCOUNTER — Telehealth: Payer: Self-pay | Admitting: Internal Medicine

## 2020-01-16 MED ORDER — INSULIN PEN NEEDLE 29G X 5MM MISC: 11 refills | Status: DC

## 2020-01-16 NOTE — Telephone Encounter (Signed)
These have now been sent.

## 2020-01-16 NOTE — Telephone Encounter (Signed)
Patient picked up RX's Novolog Flexpen and Tresiba FlexTouch Pen, however patient did not receive any needles for either. Patient requests the following RX:  \Medication Refill Request  Did you call your pharmacy and request this refill first?N/A . If patient has not contacted pharmacy first, instruct them to do so for future refills.  . Remind them that contacting the pharmacy for their refill is the quickest method to get the refill.  . Refill policy also stated that it will take anywhere between 24-72 hours to receive the refill.    Name of medication? Needles for both Novolog Flexpen AND Tyler Aas Flextouch Pen  Is this a 90 day supply? Yes-both  Name and location of pharmacy?   [id number] [address], [address] Phone:  [phone]  Fax:  [phone]      . Is the request for diabetes test strips? No . If yes, what brand? No

## 2020-01-17 LAB — FRUCTOSAMINE: Fructosamine: 335 umol/L — ABNORMAL HIGH (ref 205–285)

## 2020-01-19 DIAGNOSIS — R002 Palpitations: Secondary | ICD-10-CM | POA: Diagnosis not present

## 2020-01-21 ENCOUNTER — Encounter: Payer: Self-pay | Admitting: Internal Medicine

## 2020-01-21 DIAGNOSIS — M25561 Pain in right knee: Secondary | ICD-10-CM | POA: Diagnosis not present

## 2020-01-21 DIAGNOSIS — M25562 Pain in left knee: Secondary | ICD-10-CM | POA: Diagnosis not present

## 2020-01-29 DIAGNOSIS — M25562 Pain in left knee: Secondary | ICD-10-CM | POA: Diagnosis not present

## 2020-01-29 DIAGNOSIS — M25561 Pain in right knee: Secondary | ICD-10-CM | POA: Diagnosis not present

## 2020-01-31 DIAGNOSIS — M25561 Pain in right knee: Secondary | ICD-10-CM | POA: Diagnosis not present

## 2020-01-31 DIAGNOSIS — M25562 Pain in left knee: Secondary | ICD-10-CM | POA: Diagnosis not present

## 2020-02-04 ENCOUNTER — Other Ambulatory Visit: Payer: Self-pay | Admitting: Physician Assistant

## 2020-02-04 DIAGNOSIS — F411 Generalized anxiety disorder: Secondary | ICD-10-CM

## 2020-02-05 DIAGNOSIS — M25562 Pain in left knee: Secondary | ICD-10-CM | POA: Diagnosis not present

## 2020-02-05 DIAGNOSIS — M25561 Pain in right knee: Secondary | ICD-10-CM | POA: Diagnosis not present

## 2020-02-08 DIAGNOSIS — M25562 Pain in left knee: Secondary | ICD-10-CM | POA: Diagnosis not present

## 2020-02-08 DIAGNOSIS — M25561 Pain in right knee: Secondary | ICD-10-CM | POA: Diagnosis not present

## 2020-02-12 DIAGNOSIS — M25562 Pain in left knee: Secondary | ICD-10-CM | POA: Diagnosis not present

## 2020-02-12 DIAGNOSIS — M25561 Pain in right knee: Secondary | ICD-10-CM | POA: Diagnosis not present

## 2020-02-14 DIAGNOSIS — M25561 Pain in right knee: Secondary | ICD-10-CM | POA: Diagnosis not present

## 2020-02-14 DIAGNOSIS — M25562 Pain in left knee: Secondary | ICD-10-CM | POA: Diagnosis not present

## 2020-02-16 ENCOUNTER — Other Ambulatory Visit: Payer: Self-pay | Admitting: Cardiology

## 2020-02-19 DIAGNOSIS — M25561 Pain in right knee: Secondary | ICD-10-CM | POA: Diagnosis not present

## 2020-02-19 DIAGNOSIS — M25562 Pain in left knee: Secondary | ICD-10-CM | POA: Diagnosis not present

## 2020-02-20 DIAGNOSIS — M1712 Unilateral primary osteoarthritis, left knee: Secondary | ICD-10-CM | POA: Diagnosis not present

## 2020-02-20 DIAGNOSIS — M1711 Unilateral primary osteoarthritis, right knee: Secondary | ICD-10-CM | POA: Diagnosis not present

## 2020-02-20 DIAGNOSIS — M170 Bilateral primary osteoarthritis of knee: Secondary | ICD-10-CM | POA: Diagnosis not present

## 2020-03-06 ENCOUNTER — Other Ambulatory Visit: Payer: Self-pay | Admitting: Physician Assistant

## 2020-03-06 DIAGNOSIS — F411 Generalized anxiety disorder: Secondary | ICD-10-CM

## 2020-04-04 ENCOUNTER — Other Ambulatory Visit: Payer: Self-pay | Admitting: Physician Assistant

## 2020-04-04 DIAGNOSIS — F411 Generalized anxiety disorder: Secondary | ICD-10-CM

## 2020-04-28 DIAGNOSIS — H0520 Unspecified exophthalmos: Secondary | ICD-10-CM | POA: Diagnosis not present

## 2020-04-28 DIAGNOSIS — H538 Other visual disturbances: Secondary | ICD-10-CM | POA: Diagnosis not present

## 2020-04-28 DIAGNOSIS — H40023 Open angle with borderline findings, high risk, bilateral: Secondary | ICD-10-CM | POA: Diagnosis not present

## 2020-04-28 DIAGNOSIS — E113293 Type 2 diabetes mellitus with mild nonproliferative diabetic retinopathy without macular edema, bilateral: Secondary | ICD-10-CM | POA: Diagnosis not present

## 2020-04-28 LAB — HM DIABETES EYE EXAM

## 2020-04-30 ENCOUNTER — Encounter: Payer: Self-pay | Admitting: *Deleted

## 2020-05-05 DIAGNOSIS — Z01419 Encounter for gynecological examination (general) (routine) without abnormal findings: Secondary | ICD-10-CM | POA: Diagnosis not present

## 2020-05-05 DIAGNOSIS — Z1231 Encounter for screening mammogram for malignant neoplasm of breast: Secondary | ICD-10-CM | POA: Diagnosis not present

## 2020-05-07 ENCOUNTER — Other Ambulatory Visit: Payer: Self-pay | Admitting: Physician Assistant

## 2020-05-07 ENCOUNTER — Other Ambulatory Visit: Payer: Self-pay | Admitting: Internal Medicine

## 2020-05-07 DIAGNOSIS — I10 Essential (primary) hypertension: Secondary | ICD-10-CM

## 2020-05-07 DIAGNOSIS — F411 Generalized anxiety disorder: Secondary | ICD-10-CM

## 2020-05-13 ENCOUNTER — Other Ambulatory Visit: Payer: Self-pay | Admitting: *Deleted

## 2020-05-13 DIAGNOSIS — R911 Solitary pulmonary nodule: Secondary | ICD-10-CM

## 2020-05-16 ENCOUNTER — Other Ambulatory Visit: Payer: Self-pay | Admitting: *Deleted

## 2020-05-16 DIAGNOSIS — R911 Solitary pulmonary nodule: Secondary | ICD-10-CM

## 2020-05-21 ENCOUNTER — Other Ambulatory Visit: Payer: Self-pay

## 2020-05-21 ENCOUNTER — Encounter: Payer: Self-pay | Admitting: Internal Medicine

## 2020-05-21 ENCOUNTER — Ambulatory Visit (INDEPENDENT_AMBULATORY_CARE_PROVIDER_SITE_OTHER): Payer: Medicare Other | Admitting: Internal Medicine

## 2020-05-21 VITALS — BP 142/80 | HR 54 | Ht 66.0 in | Wt 189.0 lb

## 2020-05-21 DIAGNOSIS — E785 Hyperlipidemia, unspecified: Secondary | ICD-10-CM

## 2020-05-21 DIAGNOSIS — E1169 Type 2 diabetes mellitus with other specified complication: Secondary | ICD-10-CM | POA: Diagnosis not present

## 2020-05-21 DIAGNOSIS — E042 Nontoxic multinodular goiter: Secondary | ICD-10-CM | POA: Diagnosis not present

## 2020-05-21 DIAGNOSIS — Z794 Long term (current) use of insulin: Secondary | ICD-10-CM

## 2020-05-21 DIAGNOSIS — I779 Disorder of arteries and arterioles, unspecified: Secondary | ICD-10-CM | POA: Diagnosis not present

## 2020-05-21 DIAGNOSIS — E1159 Type 2 diabetes mellitus with other circulatory complications: Secondary | ICD-10-CM

## 2020-05-21 LAB — POCT GLYCOSYLATED HEMOGLOBIN (HGB A1C): Hemoglobin A1C: 9.6 % — AB (ref 4.0–5.6)

## 2020-05-21 NOTE — Patient Instructions (Addendum)
Please  continue: - Tresiba 40 units daily - Farxiga 10 mg daily before b'fast  Change: - Novolog 10-14 units 2x day before b'fast and dinner (may need this before lunch, also)  Please stop at the lab.  Please return in 3 months with your sugar log.

## 2020-05-21 NOTE — Addendum Note (Signed)
Addended by: Cardell Peach I on: 05/21/2020 12:02 PM   Modules accepted: Orders

## 2020-05-21 NOTE — Progress Notes (Addendum)
Patient ID: [name], female   DOB: [birth year], 74 y.o.   MRN: [id number]   This visit occurred during the SARS-CoV-2 public health emergency.  Safety protocols were in place, including screening questions prior to the visit, additional usage of staff PPE, and extensive cleaning of exam room while observing appropriate contact time as indicated for disinfecting solutions.   HPI: [name] is a 74 y.o.-year-old female, initially referred by her cardiologist, [name], presenting for follow-up for DM2, dx in ~2000, insulin-dependent since ~2005, uncontrolled, with complications (PVD - s/p stents and fem-pop bypass; h/o CVA x3; CKD stage 3b; DR; PN). She saw my colleague, [name] many years ago.  Last visit with me 4 months ago. PCP: Karen Mutters, PA  At this visit, she is late as she mistakenly went to another office first.  At last visit sugars are higher after she was on steroids for shingles.   Reviewed HbA1c levels: 01/14/2020: HbA1c calculated from fructosamine 7.3% 10/08/2019: HbA1c calculated from fructosamine: 6.9% Lab Results  Component Value Date   HGBA1C 9.8 (A) 01/14/2020   HGBA1C 9.1 (A) 07/09/2019   HGBA1C 10.9 (H) 11/13/2018   HGBA1C 9.7 (H) 07/19/2018   HGBA1C 8.7 (H) 04/18/2018   HGBA1C 8.3 (H) 01/03/2018   HGBA1C 10.0 (H) 09/27/2017   HGBA1C 10.5 (H) 05/04/2017   HGBA1C 8.9 02/25/2017   HGBA1C 10.9 (H) 11/10/2016   Her antipancreatic antibodies were negative: Component     Latest Ref Rng & Units 07/22/2016  Glutamic Acid Decarb Ab     <5 IU/mL <5   At last visit, she was on: - Novolog 70/30 35 units before breakfast and 30 units before dinner- injecting hips and arms due to abdominal pain - Farxiga 5 mg in am - started 06/2019 >> 10 mg daily - increased 09/2019  We changed to: - Tresiba 34 >> 40 units daily - Novolog 6-8 >> 12 units 2x day before b'fast and dinner (+/- lunch)  - Farxiga 10 mg daily before b'fast  She could not tolerate Ozempic  (started 11/2018) due to nausea, vomiting, abdominal pain-stopped 04/2019. She was on Invokana >> intolerance 2/2 weight loss and dehydration >> hospitalization. She was on Metformin and sulfonylurea at diagnosis. She was also on Januvia at the same time with Invokana >> stopped along with Invokana   Pt checks her sugars approximately twice a day: - am:  86-138 >> 88, 102-210, 253, 283 >>  - 2h after b'fast: 326 >> n/c >> 326 (10/2019) - before lunch: 265 >> 124-172 >> n/c - 2h after lunch: 280, 286 >> n/c - before dinner: 166-180s >> 68-157 >> 73-193, 215 - 2h after dinner: 247 >> (late dinner) 196- 201 >> 172 - bedtime: n/c - nighttime: n/c Lowest sugar was 68 >> 73 >> 76; she has hypoglycemia awareness at 80. Highest sugar was 201 >> 283 (steroid) >> 335 (no medicines).  Glucometer: [place]  Pt's meals are: - Breakfast: eggs or cheese sandwich - Lunch: half a sandwich, apple - Dinner 9-9:30 pm: chicken, veggies - Snacks: stopped cheese; now apples and pickles She works part-time, from 10 am - 2 pm Tue-Fri.   -+ CKD stage IIIb, last BUN/creatinine:  Lab Results  Component Value Date   BUN 26 (H) 01/14/2020   BUN 22 10/18/2019   CREATININE 1.81 (H) 01/14/2020   CREATININE 1.87 (H) 10/18/2019   Lab Results  Component Value Date   GFRAA 31 (L) 10/18/2019   GFRAA 31 (L) 10/08/2019  GFRAA 37 (L) 11/13/2018   GFRAA 42 (L) 08/28/2018   GFRAA 46 (L) 07/19/2018  On olmesartan 40.  -+ HL; last set of lipids: Lab Results  Component Value Date   CHOL 132 10/08/2019   HDL 44.30 10/08/2019   LDLCALC 66 10/08/2019   LDLDIRECT 146.4 01/30/2010   TRIG 109.0 10/08/2019   CHOLHDL 3 10/08/2019  On Crestor 40, omega-3 fatty acids 1200 mg daily  - last eye exam was in 04/28/2020: + DR, + glaucoma  -she has Numbness and tingling in her feet.  She has a callus on the left foot.  She sees a podiatrist. She did not tolerate Lyrica.  On ASA 81.  Pt has FH of DM in sister.  She  has a history of nontoxic multinodular goiter.    Latest TSH was normal: Lab Results  Component Value Date   TSH 1.65 11/13/2018   She also has a history of HTN, brain aneurysm, emphysema, esophageal stricture, GERD.  She had hernia repair surgery- had mesh placed >> persistent abdominal pain.  Her husband's nephew, [name], was also my patient and he died at the beginning of 12/15/2019 after he stopped going to dialysis.  ROS: Constitutional: no weight gain/no weight loss, no fatigue, no subjective hyperthermia, no subjective hypothermia Eyes: no blurry vision, no xerophthalmia ENT: no sore throat, no nodules palpated in neck, + dysphagia, no odynophagia, no hoarseness Cardiovascular: no CP/no SOB/no palpitations/no leg swelling Respiratory: no cough/no SOB/no wheezing Gastrointestinal: no N/no V/no D/no C/no acid reflux Musculoskeletal: no muscle aches/no joint aches Skin: no rashes, no hair loss Neurological: no tremors/no numbness/no tingling/no dizziness, + HA  I reviewed pt's medications, allergies, PMH, social hx, family hx, and changes were documented in the history of present illness. Otherwise, unchanged from my initial visit note.  Past Medical History:  Diagnosis Date  . Abnormal findings on esophagogastroduodenoscopy (EGD) 07/2010  . Aneurysm [facility]) 12-15-2011   Right Brain   . Aneurysm ([place])    in brain x 2  . Anxiety   . Colon polyps   . Diabetes mellitus 1998  . Diverticulosis   . ESOPHAGEAL STRICTURE 08/27/2008  . GERD (gastroesophageal reflux disease)   . Hemorrhoids   . Hiatal hernia   . Hyperlipidemia   . Hypertension 1998-12-14  . Migraines   . Peripheral vascular disease ([place])   . Phlebitis    30 years ago  left leg  . Stroke [facility])    multiple mini strokes ( brain aneurysm )   Past Surgical History:  Procedure Laterality Date  . ABDOMINAL AORTAGRAM N/A 01/04/2012   Procedure: ABDOMINAL Maxcine Ham;  Surgeon: Serafina Mitchell, MD;  Location[facility] CATH LAB;   Service: Cardiovascular;  Laterality: N/A;  . ABDOMINAL AORTAGRAM N/A 08/15/2012   Procedure: ABDOMINAL Maxcine Ham;  Surgeon: Serafina Mitchell, MD;  Location: [facility] CATH LAB;  Service: Cardiovascular;  Laterality: N/A;  . ABDOMINAL HYSTERECTOMY  1982-12-14  . cataract surgery Right 10-22-2015  . cataract surgery Left 11-05-2015  . CESAREAN SECTION    . CHOLECYSTECTOMY    . COLONOSCOPY  07/2010  . DENTAL SURGERY  Aug. 16, 2013   left lower   . ESOPHAGOGASTRODUODENOSCOPY  07/2010  . EYE SURGERY  Nov. 2014   Laser-Glaucoma  . FEMORAL ARTERY STENT  05/11/11   Left superficial femoral and popliteal artery  . HERNIA REPAIR     times two  . KNEE SURGERY    . LOWER EXTREMITY ANGIOGRAM Left 08/15/2012   Procedure: LOWER  EXTREMITY ANGIOGRAM;  Surgeon: Serafina Mitchell, MD;  Location: [facility] CATH LAB;  Service: Cardiovascular;  Laterality: Left;  lt leg angio  . rotator cuff surgery    . THYROID SURGERY     Social History   Socioeconomic History  . Marital status: Married    Spouse name: Not on file  . Number of children: 2  . Years of education: Not on file  . Highest education level: Not on file  Occupational History  . 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 Smoking status: Current Every Day Smoker    Packs/day: 0.25    Years: 40.00    Pack years: 10.00    Types: Cigarettes  . Smokeless tobacco: Never Used  Vaping Use  . Vaping Use: Never used  Substance and Sexual Activity  . Alcohol use: No    Alcohol/week: 0.0 standard drinks  . Drug use: No  . Sexual activity: Not on file  Other Topics Concern  . Not on file  Social History Narrative   Lives with husband.        Social Determinants of Health   Financial Resource Strain:   . Difficulty of Paying Living Expenses: Not on file  Food Insecurity:   . Worried About Charity fundraiser in the Last Year: Not on file  . Ran Out of Food in the Last Year: Not on file  Transportation Needs:   . Lack of  Transportation (Medical): Not on file  . Lack of Transportation (Non-Medical): Not on file  Physical Activity:   . Days of Exercise per Week: Not on file  . Minutes of Exercise per Session: Not on file  Stress:   . Feeling of Stress : Not on file  Social Connections:   . Frequency of Communication with Friends and Family: Not on file  . Frequency of Social Gatherings with Friends and Family: Not on file  . Attends Religious Services: Not on file  . Active Member of Clubs or Organizations: Not on file  . Attends Archivist Meetings: Not on file  . Marital Status: Not on file  Intimate Partner Violence:   . Fear of Current or Ex-Partner: Not on file  . Emotionally Abused: Not on file  . Physically Abused: Not on file  . Sexually Abused: Not on file   Current Outpatient Medications on File Prior to Visit  Medication Sig Dispense Refill  . albuterol (VENTOLIN HFA) 108 (90 Base) MCG/ACT inhaler Inhale 2 puffs into the lungs every 4 (four) hours as needed for wheezing or shortness of breath. 18 g 11  . amLODipine (NORVASC) 10 MG tablet TAKE 1 TABLET DAILY FOR BLOOD PRESSURE 90 tablet 3  . aspirin EC 81 MG tablet Take 81 mg by mouth at bedtime.     . bisoprolol-hydrochlorothiazide (ZIAC) 10-6.25 MG tablet TAKE 1 TABLET BY MOUTH EVERY DAY 90 tablet 3  . Blood Glucose Monitoring Suppl (ACCU-CHEK AVIVA PLUS) W/DEVICE KIT Check blood sugar up to three times daily 1 kit 0  . Cholecalciferol (VITAMIN D PO) Take 5,000 Units by mouth daily.    Marland Kitchen CINNAMON PO Take 1,000 mg by mouth 2 (two) times daily.     . citalopram (CELEXA) 40 MG tablet Take 1 tablet Daily for Mood & Anxiety 90 tablet 3  . clonazePAM (KLONOPIN) 0.5 MG tablet TAKE 1/2-1 TAB AT BEDTIME IF NEEDED FOR SLEEP & TRY TO LIMIT TO 5 DAYS/WEEK TO AVOID ADDICTION 30 tablet 0  .  dapagliflozin propanediol (FARXIGA) 10 MG TABS tablet Take 10 mg by mouth daily before breakfast. 90 tablet 3  . glucose blood (ACCU-CHEK AVIVA PLUS) test  strip Use daily to check BS TID Dx. E11.22 300 each 1  . insulin aspart (NOVOLOG FLEXPEN) 100 UNIT/ML FlexPen Inject 6-8 Units into the skin 3 (three) times daily with meals. 15 mL 11  . insulin degludec (TRESIBA FLEXTOUCH) 200 UNIT/ML FlexTouch Pen Inject 34 units daily under skin 3 pen 5  . insulin NPH-regular Human (NOVOLIN 70/30) (70-30) 100 UNIT/ML injection INJECT 35 UNITSUNDER THE SKIN IN THE MORNING BEFORE FOOD, AND INJECT 30 UNITS BEFORE THE EVENING MEAL. 60 mL 1  . Insulin Pen Needle 29G X 5MM MISC Use with insulin pen 3 times a day. 300 each 11  . Insulin Syringe-Needle U-100 31G X 15/64" 0.5 ML MISC Use two pens daily with insulin 100 each 3  . Magnesium 500 MG TABS Take 500 mg by mouth daily.    Marland Kitchen olmesartan (BENICAR) 40 MG tablet TAKE 1 TABLET DAILY FOR BLOOD PRESSURE 90 tablet 3  . Omega-3 Fatty Acids (FISH OIL) 1200 MG CAPS Take 1,200 capsules by mouth daily.     Marland Kitchen omeprazole (PRILOSEC) 40 MG capsule TAKE 1 CAPSULE BY MOUTH EVERY DAY 90 capsule 2  . OVER THE COUNTER MEDICATION Take 1 tablet by mouth daily. One a day vitamin Woman's one a day    . rosuvastatin (CRESTOR) 40 MG tablet TAKE 1 /2 TO 1 TABLET DAILY FOR CHOLESTEROL 90 tablet 1  . Tiotropium Bromide Monohydrate (SPIRIVA RESPIMAT) 2.5 MCG/ACT AERS Inhale 1 puff into the lungs daily. 4 g 11  . valACYclovir (VALTREX) 1000 MG tablet Take 1 tablet (1,000 mg total) by mouth 3 (three) times daily. Take for 7 days 21 tablet 0  . vitamin E 400 UNIT capsule Take 400 Units by mouth daily.     No current facility-administered medications on file prior to visit.   Allergies  Allergen Reactions  . Ciprofloxacin Other (See Comments)  . Ozempic (0.25 Or 0.5 Mg-Dose) [Semaglutide(0.25 Or 0.36m-Dos)] Nausea And Vomiting  . Plavix [Clopidogrel Bisulfate] Palpitations  . Amoxicillin Itching and Swelling    FACE & EYES SWELL  . Ace Inhibitors   . Ciprofloxacin Hcl Swelling  . Lyrica [Pregabalin]     Itch, gain weight  . Penicillins  Other (See Comments)    REACTION: unspecified   Family History  Problem Relation Age of Onset  . CAD Mother 680      Died of MI  . Hypertension Mother   . Heart attack Mother   . Heart disease Mother   . CAD Father 641      Died of MI  . Heart disease Father   . Heart attack Father   . CAD Brother 674      Two brothers died of MI  . Heart attack Brother   . Heart disease Brother        Amputation  . Diabetes Sister   . Hypertension Sister   . Heart attack Brother   . Heart disease Brother   . Stroke Sister   . Colon cancer Neg Hx   . Stomach cancer Neg Hx   . Esophageal cancer Neg Hx     PE: BP (!) 142/80   Pulse (!) 54   Ht 5' 6"  (1.676 m)   Wt 189 lb (85.7 kg)   SpO2 93%   BMI 30.51 kg/m  Wt Readings from  Last 3 Encounters:  05/21/20 189 lb (85.7 kg)  01/14/20 189 lb (85.7 kg)  12/31/19 188 lb (85.3 kg)   Constitutional: overweight, in NAD Eyes: PERRLA, EOMI, no exophthalmos ENT: moist mucous membranes, no thyromegaly, no cervical lymphadenopathy Cardiovascular: RRR, No MRG Respiratory: CTA B Gastrointestinal: abdomen soft, NT, ND, BS+ Musculoskeletal: no deformities, strength intact in all 4 Skin: moist, warm, no rashes Neurological: no tremor with outstretched hands, DTR normal in all 4  ASSESSMENT: 1. DM2, insulin-dependent, uncontrolled, with complications - PVD - s/p stents and fem-pop bypass - h/o CVA - CKD stage 3b - DR - PN  2.  Hyperlipidemia  3.  Obesity class 1  PLAN:  1. Patient with longstanding, uncontrolled, type 2 diabetes, previously on premixed insulin regimen and SGLT2 inhibitor but with still poor control, so at last visit we switched to a basal/bolus insulin regimen. We did not use Invokana due to severe PVD and also previous intolerance with severe dehydration, but we are using [place]. We tried Ozempic but she could not tolerate it due to GI symptoms. She did not want to try Trulicity. -At last visit, sugars are higher and we  switched to [place] and NovoLog, giving her a more flexible regimen. We also discussed about a VGo patch pump but she did not want to try that at the time -At today's visit, she was late for the appointment and forgot her meter. She could not remember her blood sugars at home. She feels that they have improved, but she could not give me any information about them. She is using a higher dose of Tresiba and NovoLog, which may be appropriate, but I did advise her to try to vary the dose of NovoLog based on the size of her meals to avoid fluctuating CBGs throughout the day. - I suggested to:  Patient Instructions  Please  continue: - Tresiba 40 units daily - Farxiga 10 mg daily before b'fast  Change: - Novolog 10-14 units 2x day before b'fast and dinner (may need this before lunch, also)  Please stop at the lab.  Please return in 3 months with your sugar log.    - we checked her HbA1c: 9.6% (slightly lower), however, fructosamine levels are more accurate for her-we will check one today - advised to check sugars at different times of the day - 3x a day, rotating check times - advised for yearly eye exams >> she is UTD - return to clinic in 3 months  2. HL -Reviewed latest lipid panel from earlier this year: Fractions at goal Lab Results  Component Value Date   CHOL 132 10/08/2019   HDL 44.30 10/08/2019   LDLCALC 66 10/08/2019   LDLDIRECT 146.4 01/30/2010   TRIG 109.0 10/08/2019   CHOLHDL 3 10/08/2019  -Continues Crestor and omega-3 fatty acids, without side effects  3.  Obesity class 1 -Unfortunately, we had to stop Ozempic due to intolerance, however, she continues [place], which should also help with weight loss -No weight loss at this visit  Office Visit on 05/21/2020  Component Date Value Ref Range Status  . Fructosamine 05/21/2020 336* 205 - 285 umol/L Final  . Hemoglobin A1C 05/21/2020 9.6* 4.0 - 5.6 % Final   HbA1c calculated from fructosamine 7.3%, much better than the  directly measured one, and stable from before.  Philemon Kingdom, MD PhD [facility] Endocrinology

## 2020-05-22 ENCOUNTER — Encounter: Payer: Medicare Other | Admitting: Cardiothoracic Surgery

## 2020-05-23 LAB — FRUCTOSAMINE: Fructosamine: 336 umol/L — ABNORMAL HIGH (ref 205–285)

## 2020-05-30 ENCOUNTER — Other Ambulatory Visit: Payer: Self-pay | Admitting: Adult Health

## 2020-05-30 DIAGNOSIS — F411 Generalized anxiety disorder: Secondary | ICD-10-CM

## 2020-05-30 MED ORDER — CLONAZEPAM 0.5 MG PO TABS: ORAL_TABLET | ORAL | 0 refills | Status: DC

## 2020-06-09 ENCOUNTER — Ambulatory Visit: Payer: Medicare Other | Admitting: Internal Medicine

## 2020-06-15 ENCOUNTER — Encounter: Payer: Self-pay | Admitting: Internal Medicine

## 2020-06-15 NOTE — Progress Notes (Signed)
Confirmed Appointment   N  O      S  H  O  W                                                                                                                                                                                                                              This very nice 74 y.o.  MBF presents for a  comprehensive evaluation and management of multiple medical co-morbidities.  Patient has been followed for HTN, HLD, T2_NIDDM  w/CKD4 and Vitamin D Deficiency. Patient is being followed by Dr Servando Snare for pulmonary nodules by CT scans minimally changed since 2016. T Patient has Xray evidence of COPD and tragically she continues to smoke.      HTN predates since     .  Patient has Aortic & Coronary Aa atherosclerosis & is also followed by Dr Percival Spanish.  Lexiscan Myoview was Negative in Mar 2020.  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  Patient also has ASPVD and she had a Lt Fem-Pop BPG  in 2012 by Dr Trula Slade.  In 2013, she had angioplasties x 2 by Dr Trula Slade for in-stent stenosis. Patient's BP has been controlled at home and patient denies any cardiac symptoms as chest pain, shortness of breath, dizziness or ankle swelling.  She reports occasional palpitations. Today's        Patient's hyperlipidemia is controlled with diet and medications. Patient denies myalgias or other medication SE's. Last lipids were at goal:  Lab Results  Component Value Date   CHOL 132 10/08/2019   HDL 44.30 10/08/2019   LDLCALC 66 10/08/2019   TRIG 109.0 10/08/2019   CHOLHDL 3 10/08/2019       Patient has Morbid Obesity (BMI 30+) and consequent T2_NIDDM  (1998)  w/CKD 4  (GFR 27.4).  Patient's kidney functions was dropped from GFR 40 last Oct 2020 to recenyt  GFR 27.  Patient is on Novolin 70/30 bid - managed by Dr Cruzita Lederer.  Patient denies reactive hypoglycemic symptoms, visual blurring, diabetic polys or paresthesias. Last A1c done recently at Dr Arman Filter office was not at goal:   Lab Results  Component Value Date   HGBA1C 9.6 (A) 05/21/2020       Finally, patient has history of Vitamin D Deficiency ("31"/2012) and last Vitamin D was still very low as she does not supplement as recommended:  Lab Results  Component Value Date   VD25OH 27 (L) 04/18/2018      Last Colon - 07/31/2010 - Dr Henrene Pastor recc 10 yr f/u - due Nov 2021  Last MGM - 10/05/2017 - Overdue  Assessment and Plan  1. Essential hypertension  - EKG 12-Lead - Urinalysis, Routine w reflex microscopic - Microalbumin / creatinine urine ratio - CBC with Differential/Platelet - COMPLETE METABOLIC PANEL WITH GFR - Magnesium - TSH  2. Hyperlipidemia associated with type 2 diabetes mellitus ([place])  - EKG 12-Lead - Lipid panel - TSH  3. Type 2 diabetes mellitus with stage 4 chronic kidney disease,  with long-term current use of insulin ([place])  - Nephrology consultation - EKG 12-Lead - Urinalysis, Routine w reflex microscopic - Microalbumin / creatinine urine ratio - HM DIABETES FOOT EXAM - LOW EXTREMITY NEUR EXAM DOCUM - PTH, intact and calcium - Hemoglobin A1c  4. Vitamin D deficiency  - VITAMIN D 25 Hydroxy   5. Peripheral vascular disease due to secondary diabetes mellitus ([place])  - Lipid panel  6. History of CVA (cerebrovascular accident)   7. Atherosclerosis of aorta ([facility])  - EKG 12-Lead  8. Coronary artery calcification  - EKG 12-Lead  9. Screening for colorectal cancer  - 10 year f/u Colon in November  10. Screening for ischemic heart disease  - EKG 12-Lead  11. FHx: heart disease  - EKG 12-Lead  12. Smoker  - EKG 12-Lead  13. Medication management  - Urinalysis, Routine w reflex microscopic - Microalbumin / creatinine urine  ratio - CBC with Differential/Platelet - COMPLETE METABOLIC PANEL WITH GFR - Magnesium - Lipid panel - TSH - Hemoglobin A1c - VITAMIN D 25 Hydroxy

## 2020-06-16 ENCOUNTER — Ambulatory Visit (INDEPENDENT_AMBULATORY_CARE_PROVIDER_SITE_OTHER): Payer: Self-pay | Admitting: Internal Medicine

## 2020-06-16 DIAGNOSIS — E1169 Type 2 diabetes mellitus with other specified complication: Secondary | ICD-10-CM

## 2020-06-16 DIAGNOSIS — Z5329 Procedure and treatment not carried out because of patient's decision for other reasons: Secondary | ICD-10-CM

## 2020-06-16 DIAGNOSIS — Z8673 Personal history of transient ischemic attack (TIA), and cerebral infarction without residual deficits: Secondary | ICD-10-CM

## 2020-06-16 DIAGNOSIS — E559 Vitamin D deficiency, unspecified: Secondary | ICD-10-CM

## 2020-06-16 DIAGNOSIS — E785 Hyperlipidemia, unspecified: Secondary | ICD-10-CM

## 2020-06-16 DIAGNOSIS — N184 Chronic kidney disease, stage 4 (severe): Secondary | ICD-10-CM

## 2020-06-16 DIAGNOSIS — E1351 Other specified diabetes mellitus with diabetic peripheral angiopathy without gangrene: Secondary | ICD-10-CM

## 2020-06-16 DIAGNOSIS — I10 Essential (primary) hypertension: Secondary | ICD-10-CM

## 2020-06-16 DIAGNOSIS — Z794 Long term (current) use of insulin: Secondary | ICD-10-CM

## 2020-06-16 DIAGNOSIS — E1122 Type 2 diabetes mellitus with diabetic chronic kidney disease: Secondary | ICD-10-CM

## 2020-06-16 NOTE — Addendum Note (Signed)
Addended by: [name] on: 06/16/2020 07:57 PM   Modules accepted: Level of Service

## 2020-06-19 ENCOUNTER — Other Ambulatory Visit: Payer: Self-pay

## 2020-06-19 ENCOUNTER — Ambulatory Visit
Admission: RE | Admit: 2020-06-19 | Discharge: 2020-06-19 | Disposition: A | Discharge status: Home / Self Care | Payer: Medicare Other | Source: Ambulatory Visit | Attending: Cardiothoracic Surgery | Admitting: Cardiothoracic Surgery

## 2020-06-19 ENCOUNTER — Ambulatory Visit (INDEPENDENT_AMBULATORY_CARE_PROVIDER_SITE_OTHER): Payer: Medicare Other | Admitting: Cardiothoracic Surgery

## 2020-06-19 VITALS — BP 160/76 | HR 55 | Temp 97.8°F | Resp 20 | Ht 66.0 in | Wt 189.0 lb

## 2020-06-19 DIAGNOSIS — I779 Disorder of arteries and arterioles, unspecified: Secondary | ICD-10-CM

## 2020-06-19 DIAGNOSIS — I2510 Atherosclerotic heart disease of native coronary artery without angina pectoris: Secondary | ICD-10-CM | POA: Diagnosis not present

## 2020-06-19 DIAGNOSIS — R911 Solitary pulmonary nodule: Secondary | ICD-10-CM

## 2020-06-19 DIAGNOSIS — R918 Other nonspecific abnormal finding of lung field: Secondary | ICD-10-CM | POA: Diagnosis not present

## 2020-06-19 DIAGNOSIS — J432 Centrilobular emphysema: Secondary | ICD-10-CM | POA: Diagnosis not present

## 2020-06-19 DIAGNOSIS — J479 Bronchiectasis, uncomplicated: Secondary | ICD-10-CM | POA: Diagnosis not present

## 2020-06-19 DIAGNOSIS — I700 Atherosclerosis of aorta: Secondary | ICD-10-CM | POA: Diagnosis not present

## 2020-06-19 NOTE — Progress Notes (Signed)
[address]       [place] [address]             [phone]                    [name] [place] Medical Record #[id number] Date of Birth: [birth year]  Referring: [name], PA-C Primary Care: [name], MD Primary Cardiologist: Minus Breeding, MD  Chief Complaint:    Chief Complaint  Patient presents with  . Lung Lesion    6 month f/u with Chest CT-super D    History of Present Illness:    [name] 74 y.o. female is seen in the office  today for follow-up CT scan for pulmonary nodules.  The patient is a long-term smoker,  smoking approximately a third of a pack a day.  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   She returns today because of a persistent groundglass opacity in the apex of the right lung that has been present for at least 2 years-he comes in today with a follow-up CT of the chest.  Patient notes that she does have some shortness of breath she has inhalers but has not used them  She is also been having trouble swallowing - on November 2 she has upper GI endoscopy scheduled to evaluate this  Current Activity/ Functional Status:  Patient is independent with mobility/ambulation, transfers, ADL's, IADL's.   Zubrod Score: At the time of surgery this patient's most appropriate activity status/level should be described as: $RemoveBefor'[]'IwgxsogWkzBx$     0    Normal activity, no symptoms $RemoveBef'[x]'ZrKcSAeiLI$     1    Restricted in physical strenuous activity but ambulatory, able to do out light work $RemoveBe'[]'IUvwvRPSn$     2    Ambulatory and capable of self care, unable to do work activities, up and about               >50 % of waking hours                              '[]'$     3    Only limited self care, in bed greater than 50% of waking hours $RemoveBefo'[]'ZneDJvHPYpQ$     4    Completely disabled, no self care, confined to bed or chair $Remove'[]'SQqRLle$     5    Moribund   Past Medical History:  Diagnosis Date  . Abnormal findings on  esophagogastroduodenoscopy (EGD) 07/2010  . Aneurysm [facility]) 2013   Right Brain   . Aneurysm ([place])    in brain x 2  . Anxiety   . Colon polyps   . Diabetes mellitus 1998  . Diverticulosis   . ESOPHAGEAL STRICTURE 08/27/2008  . GERD (gastroesophageal reflux disease)   . Hemorrhoids   . Hiatal hernia   . Hyperlipidemia   . Hypertension 2000  . Migraines   . Peripheral vascular disease ([place])   . Phlebitis    30 years ago  left leg  . Stroke [facility])    multiple mini strokes ( brain aneurysm )    Past Surgical History:  Procedure Laterality Date  . ABDOMINAL AORTAGRAM N/A 01/04/2012   Procedure: ABDOMINAL Maxcine Ham;  Surgeon: Serafina Mitchell, MD;  Location: [name] CATH LAB;  Service: Cardiovascular;  Laterality: N/A;  . ABDOMINAL AORTAGRAM N/A 08/15/2012   Procedure: ABDOMINAL  Maxcine Ham;  Surgeon: Serafina Mitchell, MD;  Location: [facility] CATH LAB;  Service: Cardiovascular;  Laterality: N/A;  . ABDOMINAL HYSTERECTOMY  1984  . cataract surgery Right 10-22-2015  . cataract surgery Left 11-05-2015  . CESAREAN SECTION    . CHOLECYSTECTOMY    . COLONOSCOPY  07/2010  . DENTAL SURGERY  Aug. 16, 2013   left lower   . ESOPHAGOGASTRODUODENOSCOPY  07/2010  . EYE SURGERY  Nov. 2014   Laser-Glaucoma  . FEMORAL ARTERY STENT  05/11/11   Left superficial femoral and popliteal artery  . HERNIA REPAIR     times two  . KNEE SURGERY    . LOWER EXTREMITY ANGIOGRAM Left 08/15/2012   Procedure: LOWER EXTREMITY ANGIOGRAM;  Surgeon: Serafina Mitchell, MD;  Location: [facility] CATH LAB;  Service: Cardiovascular;  Laterality: Left;  lt leg angio  . rotator cuff surgery    . THYROID SURGERY      Family History  Problem Relation Age of Onset  . CAD Mother 37       Died of MI  . Hypertension Mother   . Heart attack Mother   . Heart disease Mother   . CAD Father 14       Died of MI  . Heart disease Father   . Heart attack Father   . CAD Brother 90       Two brothers died of MI  . Heart attack Brother   . Heart  disease Brother        Amputation  . Diabetes Sister   . Hypertension Sister   . Heart attack Brother   . Heart disease Brother   . Stroke Sister   . Colon cancer Neg Hx   . Stomach cancer Neg Hx   . Esophageal cancer Neg Hx      Social History   Tobacco Use  Smoking Status Current Every Day Smoker  . Packs/day: 0.25  . Years: 40.00  . Pack years: 10.00  . Types: Cigarettes  Smokeless Tobacco Never Used    Social History   Substance and Sexual Activity  Alcohol Use No  . Alcohol/week: 0.0 standard drinks     Allergies  Allergen Reactions  . Ciprofloxacin Other (See Comments)  . Ozempic (0.25 Or 0.5 Mg-Dose) [Semaglutide(0.25 Or 0.54m-Dos)] Nausea And Vomiting  . Plavix [Clopidogrel Bisulfate] Palpitations  . Amoxicillin Itching and Swelling    FACE & EYES SWELL  . Ace Inhibitors   . Ciprofloxacin Hcl Swelling  . Lyrica [Pregabalin]     Itch, gain weight  . Penicillins Other (See Comments)    REACTION: unspecified    Current Outpatient Medications  Medication Sig Dispense Refill  . albuterol (VENTOLIN HFA) 108 (90 Base) MCG/ACT inhaler Inhale 2 puffs into the lungs every 4 (four) hours as needed for wheezing or shortness of breath. 18 g 11  . amLODipine (NORVASC) 10 MG tablet TAKE 1 TABLET DAILY FOR BLOOD PRESSURE 90 tablet 3  . aspirin EC 81 MG tablet Take 81 mg by mouth at bedtime.     . bisoprolol-hydrochlorothiazide (ZIAC) 10-6.25 MG tablet TAKE 1 TABLET BY MOUTH EVERY DAY 90 tablet 3  . Blood Glucose Monitoring Suppl (ACCU-CHEK AVIVA PLUS) W/DEVICE KIT Check blood sugar up to three times daily 1 kit 0  . Cholecalciferol (VITAMIN D PO) Take 5,000 Units by mouth daily.    .Marland KitchenCINNAMON PO Take 1,000 mg by mouth 2 (two) times daily.     . citalopram (CELEXA) 40 MG tablet  Take 1 tablet Daily for Mood & Anxiety 90 tablet 3  . clonazePAM (KLONOPIN) 0.5 MG tablet TAKE 1/2-1 TAB AT BEDTIME IF NEEDED FOR SLEEP & TRY TO LIMIT TO 5 DAYS/WEEK TO AVOID ADDICTION 30 tablet  0  . dapagliflozin propanediol (FARXIGA) 10 MG TABS tablet Take 10 mg by mouth daily before breakfast. 90 tablet 3  . glucose blood (ACCU-CHEK AVIVA PLUS) test strip Use daily to check BS TID Dx. E11.22 300 each 1  . insulin aspart (NOVOLOG FLEXPEN) 100 UNIT/ML FlexPen Inject 6-8 Units into the skin 3 (three) times daily with meals. (Patient taking differently: Inject 14 Units into the skin 3 (three) times daily with meals. ) 15 mL 11  . insulin degludec (TRESIBA FLEXTOUCH) 200 UNIT/ML FlexTouch Pen Inject 34 units daily under skin (Patient taking differently: Inject 40 Units into the skin daily. Inject 34 units daily under skin) 3 pen 5  . Insulin Pen Needle 29G X 5MM MISC Use with insulin pen 3 times a day. 300 each 11  . Insulin Syringe-Needle U-100 31G X 15/64" 0.5 ML MISC Use two pens daily with insulin 100 each 3  . Magnesium 500 MG TABS Take 500 mg by mouth daily.    Marland Kitchen olmesartan (BENICAR) 40 MG tablet TAKE 1 TABLET DAILY FOR BLOOD PRESSURE 90 tablet 3  . Omega-3 Fatty Acids (FISH OIL) 1200 MG CAPS Take 1,200 capsules by mouth daily.     Marland Kitchen omeprazole (PRILOSEC) 40 MG capsule TAKE 1 CAPSULE BY MOUTH EVERY DAY 90 capsule 2  . OVER THE COUNTER MEDICATION Take 1 tablet by mouth daily. One a day vitamin Woman's one a day    . Tiotropium Bromide Monohydrate (SPIRIVA RESPIMAT) 2.5 MCG/ACT AERS Inhale 1 puff into the lungs daily. 4 g 11  . valACYclovir (VALTREX) 1000 MG tablet Take 1 tablet (1,000 mg total) by mouth 3 (three) times daily. Take for 7 days 21 tablet 0  . vitamin E 400 UNIT capsule Take 400 Units by mouth daily.    . insulin NPH-regular Human (NOVOLIN 70/30) (70-30) 100 UNIT/ML injection INJECT 35 UNITSUNDER THE SKIN IN THE MORNING BEFORE FOOD, AND INJECT 30 UNITS BEFORE THE EVENING MEAL. (Patient not taking: Reported on 06/19/2020) 60 mL 1  . rosuvastatin (CRESTOR) 40 MG tablet TAKE 1 /2 TO 1 TABLET DAILY FOR CHOLESTEROL 90 tablet 1   No current facility-administered medications for  this visit.    Pertinent items are noted in HPI.   Review of Systems:     Cardiac Review of Systems: [Y] = yes  or   [ N ] = no   Chest Pain [  ]  Resting SOB [  ] Exertional SOB  [  ]  Orthopnea $RemoveBe'[ ]'evZKCPrtM$    Pedal Edema $RemoveBef'[ ]'rVaUPGTQxa$     Palpitations $RemoveBefor'[ ]'hpbkcHtKtqhD$  Syncope  $Remove'[ ]'PqoobQh$    Presyncope $RemoveBef'[]'UEGMawWrCK$    General Review of Systems: [Y] = yes [  ]=no Constitional: recent weight change [  ];  Wt loss over the last 3 months [   ] anorexia [  ]; fatigue [  ]; nausea [  ]; night sweats [  ]; fever [  ]; or chills [  ];           Eye : blurred vision [  ]; diplopia [   ]; vision changes [  ];  Amaurosis fugax[  ]; Resp: cough [  ];  wheezing[  ];  hemoptysis[  ]; shortness of breath[  ]; paroxysmal nocturnal dyspnea[  ];  dyspnea on exertion[  ]; or orthopnea[  ];  GI:  gallstones[  ], vomiting[  ];  dysphagia[  ]; melena[  ];  hematochezia [  ]; heartburn[  ];   Hx of  Colonoscopy[  ]; GU: kidney stones [  ]; hematuria[  ];   dysuria [  ];  nocturia[  ];  history of     obstruction [  ]; urinary frequency [  ]             Skin: rash, swelling[  ];, hair loss[  ];  peripheral edema[  ];  or itching[  ]; Musculosketetal: myalgias[  ];  joint swelling[  ];  joint erythema[  ];  joint pain[  ];  back pain[  ];  Heme/Lymph: bruising[  ];  bleeding[  ];  anemia[  ];  Neuro: TIA[  ];  headaches[  ];  stroke[y  ];  vertigo[  ];  seizures[  ];   paresthesias[  ];  difficulty walking[  ];  Psych:depression[  ]; anxiety[  ];  Endocrine: diabetes[  ];  thyroid dysfunction[  ];  Immunizations: Flu up to date [  ]; Pneumococcal up to date [  ];  Other:     PHYSICAL EXAMINATION: BP (!) 160/76   Pulse (!) 55   Temp 97.8 F (36.6 C) (Skin)   Resp 20   Ht 5' 6"  (1.676 m)   Wt 189 lb (85.7 kg)   SpO2 96% Comment: RA  BMI 30.51 kg/m  General appearance: alert, cooperative and no distress Head: Normocephalic, without obvious abnormality, atraumatic Neck: no adenopathy, no carotid bruit, no JVD, supple, symmetrical, trachea midline and  thyroid not enlarged, symmetric, no tenderness/mass/nodules Lymph nodes: Cervical, supraclavicular, and axillary nodes normal. Resp: clear to auscultation bilaterally Back: symmetric, no curvature. ROM normal. No CVA tenderness. Cardio: regular rate and rhythm, S1, S2 normal, no murmur, click, rub or gallop GI: soft, non-tender; bowel sounds normal; no masses,  no organomegaly Extremities: extremities normal, atraumatic, no cyanosis or edema and Homans sign is negative, no sign of DVT Neurologic: Grossly normal  Diagnostic Studies & Laboratory data:     Recent Radiology Findings:   CT Super D Chest Wo Contrast  Result Date: 06/19/2020 CLINICAL DATA:  Follow-up ground-glass opacities. EXAM: CT CHEST WITHOUT CONTRAST TECHNIQUE: Multidetector CT imaging of the chest was performed using thin slice collimation for electromagnetic bronchoscopy planning purposes, without intravenous contrast. COMPARISON:  November 19, 2019 FINDINGS: Cardiovascular: Calcified atheromatous plaque in the thoracic aorta. Three-vessel coronary artery disease. Both these findings are unchanged compared to the prior study. No aneurysmal dilation. Central pulmonary vasculature unremarkable on noncontrast imaging. No pericardial effusion. Limited assessment of cardiovascular structures given lack of intravenous contrast. Mediastinum/Nodes: Thoracic inlet structures are normal. No axillary lymphadenopathy. No mediastinal adenopathy. No hilar adenopathy. Lungs/Pleura: RIGHT upper lobe ground-glass opacity measuring 1.7 x 1.2 cm within 1 mm of previous measurement (image 28, series 8) Millimetric nodules in the RIGHT upper lobe seen on the prior study are unchanged. Densely calcified granuloma in the RIGHT lower lobe as before. RIGHT middle lobe bronchial dilation is stable. Centrilobular pulmonary emphysema as before.  Airways are patent. Upper Abdomen: Incidental imaging of upper abdominal contents without acute process. Thickening of  bilateral adrenals likely adrenal hyperplasia similar to previous imaging, adrenals are incompletely imaged. Musculoskeletal: No acute musculoskeletal process. Spinal degenerative changes. IMPRESSION: 1. RIGHT upper lobe ground-glass opacity measuring 1.7 x 1.2 cm within 1 mm of previous measurement. Findings shows  slow enlargement over time with definite difference in size compared to the study of May 16, 2015 where it measured approximately 1.3 x 1.2 cm. Slow enlargement and persistence could be seen in the setting of bronchogenic neoplasm, but there is no current solid component or significant change since studies dating back to March of 2020 2. Millimetric nodules in the RIGHT upper lobe seen on the prior study are unchanged. 3. Three-vessel coronary artery disease. 4. Emphysema and aortic atherosclerosis. Aortic Atherosclerosis (ICD10-I70.0) and Emphysema (ICD10-J43.9). Electronically Signed   By: Zetta Bills M.D.   On: 06/19/2020 09:32   I have independently reviewed the above radiology studies  and reviewed the findings with the patient.  CT Super D Chest Wo Contrast  Result Date: 11/19/2019 CLINICAL DATA:  Follow-up pulmonary lesions. EXAM: CT CHEST WITHOUT CONTRAST TECHNIQUE: Multidetector CT imaging of the chest was performed using thin slice collimation for electromagnetic bronchoscopy planning purposes, without intravenous contrast. COMPARISON:  04/19/2019 FINDINGS: Cardiovascular: The heart is normal in size. No pericardial effusion. Stable tortuosity, mild ectasia and advanced calcifications involving the thoracic aorta. Stable advanced branch vessel calcifications including three-vessel coronary artery calcifications. Mediastinum/Nodes: Stable scattered mediastinal and hilar lymph nodes all measuring less than 8 mm. Stable calcified right hilar and right mediastinal lymph nodes and stable calcified granuloma in the right lower lobe. The esophagus is grossly normal. The thyroid gland is  unremarkable. Lungs/Pleura: Stable stable emphysematous changes and areas of pulmonary scarring. Right upper lobe ground-glass opacity on image 28/8 measures 17.5 x 14 mm. This previously measured 16 x 12 mm. No solid nodular components are identified. The 5 mm nodule in the left lower lobe on the prior study on image 58/8 is no longer identified and was likely an area of inflammation. Stable large calcified granuloma in the right lower lobe. Small scattered sub solid nodular lesions in the right upper lobe. None of these measures more than 4 mm. 4 mm nodule in the right lower lobe anteriorly on image 64/8 is stable. Stable area of right middle lobe bronchiectasis. Upper Abdomen: No significant upper abdominal findings. No worrisome hepatic or adrenal gland lesions. Stable mild enlargement and slight nodularity of the left adrenal gland. Stable advanced aortic and branch vessel calcifications and stable surgical changes from anterior abdominal wall hernia repair. Stable calcified granulomas in the spleen and liver. Musculoskeletal: No chest wall mass, breast masses, supraclavicular or axillary adenopathy. The bony structures are unremarkable. IMPRESSION: 1. Stable emphysematous changes and areas of pulmonary scarring. 2. Slight interval enlargement of the ground-glass opacity in the right upper lobe. Continued surveillance versus PET-CT. 3. Other stable, some resolved and some new small scattered sub solid nodular lesions. 4. Stable calcified granuloma in the right lower lobe, calcified right mediastinal and hilar lymph nodes and calcified granulomas in the liver and spleen. 5. Stable advanced atherosclerotic calcifications involving the thoracic and abdominal aorta and branch vessels including three-vessel coronary artery calcifications. 6. Stable mild enlargement and nodularity of the left adrenal gland. Aortic Atherosclerosis (ICD10-I70.0) and Emphysema (ICD10-J43.9). Aortic Atherosclerosis (ICD10-I70.0) and  Emphysema (ICD10-J43.9). Electronically Signed   By: Marijo Sanes M.D.   On: 11/19/2019 11:58     I have independently reviewed the above radiology studies  and reviewed the findings with the patient.   03/2019     04/2015   ADDENDUM REPORT: 12/14/2018 16:52  ADDENDUM: Cardiothoracic surgeon Dr. Servando Snare has requested a second opinion on this case.  ADDENDED FINDINGS:  Cardiovascular: Normal heart size. No significant pericardial effusion/thickening.  Three-vessel coronary atherosclerosis. Atherosclerotic nonaneurysmal thoracic aorta. Top-normal caliber main pulmonary artery (3 1 cm diameter) stable.  Mediastinum/Nodes: No discrete thyroid nodules. Unremarkable esophagus. No axillary adenopathy. Stable coarsely calcified nonenlarged right paratracheal and right hilar nodes from prior granulomatous disease. No pathologically enlarged mediastinal or discrete hilar nodes on this noncontrast scan.  Lungs/Pleura: No pneumothorax. No pleural effusion. Moderate centrilobular emphysema with diffuse bronchial wall thickening. Medial apical right upper lobe 2.0 x 1.3 cm ground-glass pulmonary nodule (series 3/image 29), previously 1.8 x 1.3 cm on 10/10/2017 chest CT and 1.7 x 1.2 cm on 10/25/2014 chest CT using similar measurement technique, minimally increased in size. Ground-glass 0.5 cm anterior right upper lobe pulmonary nodule (series 3/image 64) is stable since 10/25/2014 chest CT. Stable coarsely calcified 9 mm peripheral right lower lobe granuloma. Ground-glass 5 mm left lower lobe pulmonary nodule (series 3/image 68) and subpleural peripheral basilar 4 mm left lower lobe solid pulmonary nodule (series 3/image 104) are stable since 10/25/2014 chest CT. No acute consolidative airspace disease, lung masses or new significant pulmonary nodules. Stable moderate varicoid bronchiectasis in the posterior right middle lobe.  Upper abdomen: Stable granulomatous splenic  calcifications. Stable appearance of the mildly thickened adrenal glands without discrete adrenal nodules.  Musculoskeletal: No aggressive appearing focal osseous lesions. Mild thoracic spondylosis.  ADDENDED IMPRESSION:  1. Dominant ground-glass pulmonary nodule in the medial apical right upper lobe measures 2.0 x 1.3 cm and has minimally increased in size on consecutive chest CT studies back to 10/25/2014. Continued chest CT surveillance warranted at a minimum. Please note that this addendum corrects a laterality error in the original report. 2. Additional subcentimeter ground-glass and solid pulmonary nodules are unchanged back to 2016 chest CT. 3. No thoracic adenopathy. 4. Three-vessel coronary atherosclerosis.  Aortic Atherosclerosis (ICD10-I70.0) and Emphysema (ICD10-J43.9).  These addended results will be called to the ordering clinician or representative by the Radiologist Assistant, and communication documented in the PACS or zVision Dashboard.   Electronically Signed By: Ilona Sorrel M.D. On: 12/14/2018 16:52     Recent Lab Findings: Lab Results  Component Value Date   WBC 10.3 11/13/2018   HGB 16.5 (H) 11/13/2018   HCT 48.3 (H) 11/13/2018   PLT 243 11/13/2018   GLUCOSE 176 (H) 01/14/2020   CHOL 132 10/08/2019   TRIG 109.0 10/08/2019   HDL 44.30 10/08/2019   LDLDIRECT 146.4 01/30/2010   LDLCALC 66 10/08/2019   ALT 14 10/08/2019   AST 19 10/08/2019   NA 139 01/14/2020   K 4.2 01/14/2020   CL 102 01/14/2020   CREATININE 1.81 (H) 01/14/2020   BUN 26 (H) 01/14/2020   CO2 32 01/14/2020   TSH 1.65 11/13/2018   INR 0.87 01/25/2017   HGBA1C 9.6 (A) 05/21/2020    PFT's Severe Obstructive Airways Disease Insignificant response to bronchodilator Restrictive process suggested by lack of over-inflation Minimal Diffusion Defect FEV1 1.2   50% predicted  Assessment / Plan:   #1 groundglass opacity right upper lobe medially appears basically  unchanged since 2016-other small nodules are unchanged   I have reviewed the CT scan with the patient and the persistent nature of the groundglass opacity in the right upper lobe.  She was not interested in surgical resection of this at this time .  She is agreeable to a repeat CT super D in 4 months , she would like to have her swallowing difficulty evaluated first .  #2 history of stroke x3, has known stable bilateral cerebral aneurysms  #3 underlying  severe obstructive airway disease with FEV1 1.2  50% predicted.    Grace Isaac MD      [address] [place] [address] Office [phone]   Beeper [phone]  06/22/2020 9:43 PM

## 2020-06-20 ENCOUNTER — Other Ambulatory Visit: Payer: Self-pay | Admitting: Internal Medicine

## 2020-06-30 DIAGNOSIS — Z23 Encounter for immunization: Secondary | ICD-10-CM | POA: Diagnosis not present

## 2020-07-04 ENCOUNTER — Other Ambulatory Visit: Payer: Self-pay | Admitting: Adult Health

## 2020-07-04 DIAGNOSIS — F411 Generalized anxiety disorder: Secondary | ICD-10-CM

## 2020-07-08 ENCOUNTER — Encounter: Payer: Self-pay | Admitting: *Deleted

## 2020-07-22 ENCOUNTER — Encounter: Payer: Self-pay | Admitting: Internal Medicine

## 2020-07-22 ENCOUNTER — Ambulatory Visit (INDEPENDENT_AMBULATORY_CARE_PROVIDER_SITE_OTHER): Payer: Medicare Other | Admitting: Internal Medicine

## 2020-07-22 VITALS — BP 150/70 | HR 58 | Ht 66.0 in | Wt 188.0 lb

## 2020-07-22 DIAGNOSIS — R112 Nausea with vomiting, unspecified: Secondary | ICD-10-CM | POA: Diagnosis not present

## 2020-07-22 DIAGNOSIS — Z1211 Encounter for screening for malignant neoplasm of colon: Secondary | ICD-10-CM

## 2020-07-22 DIAGNOSIS — I779 Disorder of arteries and arterioles, unspecified: Secondary | ICD-10-CM | POA: Diagnosis not present

## 2020-07-22 DIAGNOSIS — K219 Gastro-esophageal reflux disease without esophagitis: Secondary | ICD-10-CM

## 2020-07-22 DIAGNOSIS — R1310 Dysphagia, unspecified: Secondary | ICD-10-CM | POA: Diagnosis not present

## 2020-07-22 MED ORDER — OMEPRAZOLE 40 MG PO CPDR
40.0000 mg | DELAYED_RELEASE_CAPSULE | Freq: Two times a day (BID) | ORAL | 3 refills | Status: DC
Start: 2020-07-22 — End: 2022-04-15

## 2020-07-22 MED ORDER — PLENVU 140 G PO SOLR: 1.0000 | Freq: Once | ORAL | 0 refills | Status: AC

## 2020-07-22 NOTE — Patient Instructions (Signed)
We have sent the following medications to your pharmacy for you to pick up at your convenience:  Omeprazole - twice a day.  You have been scheduled for an endoscopy and colonoscopy. Please follow the written instructions given to you at your visit today. Please pick up your prep supplies at the pharmacy within the next 1-3 days. If you use inhalers (even only as needed), please bring them with you on the day of your procedure.

## 2020-07-22 NOTE — Progress Notes (Signed)
HISTORY OF PRESENT ILLNESS:  [name] is a 74 y.o. female who presents today regarding follow-up screening colonoscopy and a 1 year history of problems with reflux, nausea and vomiting, and dysphagia.  Patient has history of chronic GERD for which she has been on omeprazole 40 mg daily for some time.  Over the past year she reports problems with awakening in the middle of the night with gagging sensation.  She describes waterbrash.  Occasional nausea with vomiting.  She also describes what sounds like aspiration of liquids on occasion with swallowing during the day.  She mentions some pill dysphagia.  No solid food dysphagia.  She did have upper endoscopy remotely for similar symptoms in 2011.  The examination was normal except for small hiatal hernia.  She denies weight loss.  She has been diabetic for at least 20 years.  Her last colonoscopy was performed in 2011.  Examination revealed severe diverticulosis throughout hemorrhoids.  Otherwise normal.  Also had colonoscopy 2004 which was negative for neoplasia.  Review of blood work from April 2021 shows normal electrolytes.  Increase creatinine 1.81.  Last hemoglobin A1c September 2021 was 9.6.  CT scan of the abdomen and pelvis July 2018 to evaluate abdominal pain with nausea and vomiting revealed no acute abnormalities.  She has completed her Covid vaccination series  REVIEW OF SYSTEMS:  All non-GI ROS negative unless otherwise stated in the HPI except for anxiety, arthritis, back pain, depression, visual change, headaches, muscle cramps, shortness of breath  Past Medical History:  Diagnosis Date  . Abnormal findings on esophagogastroduodenoscopy (EGD) 07/2010  . Aneurysm [facility]) 2013   Right Brain   . 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 Anxiety   . Colon polyps   . Diabetes mellitus 1998  . Diverticulosis   . ESOPHAGEAL STRICTURE 08/27/2008  . GERD (gastroesophageal reflux disease)   . Hemorrhoids   . Hiatal hernia   . Hyperlipidemia   .  Hypertension 2000  . Migraines   . Peripheral vascular disease ([place])   . Phlebitis    30 years ago  left leg  . Stroke [facility])    multiple mini strokes ( brain aneurysm )    Past Surgical History:  Procedure Laterality Date  . ABDOMINAL AORTAGRAM N/A 01/04/2012   Procedure: ABDOMINAL Maxcine Ham;  Surgeon: Serafina Mitchell, MD;  Location: [facility] CATH LAB;  Service: Cardiovascular;  Laterality: N/A;  . ABDOMINAL AORTAGRAM N/A 08/15/2012   Procedure: ABDOMINAL Maxcine Ham;  Surgeon: Serafina Mitchell, MD;  Location: [facility] CATH LAB;  Service: Cardiovascular;  Laterality: N/A;  . ABDOMINAL HYSTERECTOMY  1984  . cataract surgery Right 10-22-2015  . cataract surgery Left 11-05-2015  . CESAREAN SECTION    . CHOLECYSTECTOMY    . COLONOSCOPY  07/2010  . DENTAL SURGERY  Aug. 16, 2013   left lower   . ESOPHAGOGASTRODUODENOSCOPY  07/2010  . EYE SURGERY  Nov. 2014   Laser-Glaucoma  . FEMORAL ARTERY STENT  05/11/11   Left superficial femoral and popliteal artery  . HERNIA REPAIR     times two  . KNEE SURGERY    . LOWER EXTREMITY ANGIOGRAM Left 08/15/2012   Procedure: LOWER EXTREMITY ANGIOGRAM;  Surgeon: Serafina Mitchell, MD;  Location: [facility] CATH LAB;  Service: Cardiovascular;  Laterality: Left;  lt leg angio  . rotator cuff surgery    . THYROID SURGERY      Social History [name]  reports that she has been smoking  cigarettes. She has a 10.00 pack-year smoking history. She has never used smokeless tobacco. She reports that she does not drink alcohol and does not use drugs.  family history includes CAD (age of onset: 27) in her brother; CAD (age of onset: 15) in her father and mother; Diabetes in her sister; Heart attack in her brother, brother, father, and mother; Heart disease in her brother, brother, father, and mother; Hypertension in her mother and sister; Stroke in her sister.  Allergies  Allergen Reactions  . Ciprofloxacin Other (See Comments)  . Ozempic (0.25 Or 0.5 Mg-Dose) [Semaglutide(0.25  Or 0.5mg -Dos)] Nausea And Vomiting  . Plavix [Clopidogrel Bisulfate] Palpitations  . Amoxicillin Itching and Swelling    FACE & EYES SWELL  . Ace Inhibitors   . Ciprofloxacin Hcl Swelling  . Lyrica [Pregabalin]     Itch, gain weight  . Penicillins Other (See Comments)    REACTION: unspecified       PHYSICAL EXAMINATION: Vital signs: BP (!) 150/70   Pulse (!) 58   Ht 5\' 6"  (1.676 m)   Wt 188 lb (85.3 kg)   SpO2 94%   BMI 30.34 kg/m   Constitutional: generally well-appearing, no acute distress Psychiatric: alert and oriented x3, cooperative Eyes: extraocular movements intact, anicteric, conjunctiva pink Mouth: oral pharynx moist, no lesions.  No thrush Neck: supple no lymphadenopathy Cardiovascular: heart regular rate and rhythm, no murmur Lungs: clear to auscultation bilaterally Abdomen: soft, nontender, nondistended, no obvious ascites, no peritoneal signs, normal bowel sounds, no organomegaly.  No succussion splash Rectal: Deferred until colonoscopy Extremities: no clubbing, cyanosis, or lower extremity edema bilaterally Skin: no lesions on visible extremities Neuro: No focal deficits.  Cranial nerves intact  ASSESSMENT:  1.  GERD.  Breakthrough symptoms 2.  Intermittent problems with nausea and vomiting as described.  Question diabetic gastroparesis 3.  Vague dysphagia.  Sounds like oropharyngeal dysphagia 4.  Colon cancer screening.  Due for follow-up.  Last examination 2011 5.  Longstanding and poorly controlled diabetes with hemoglobin A1c 9.6   PLAN:  1.  Reflux precautions 2.  Increase omeprazole to 40 mg twice daily.  Prescription rewritten 3.  Schedule upper endoscopy to evaluate breakthrough reflux symptoms, nausea, vomiting, and vague dysphagia. 4.  Schedule screening colonoscopy. 5.  May need gastric emptying scan to rule out gastroparesis.  To be determined post endoscopy 6.  May need modified barium swallow with speech pathology to rule out  oropharyngeal dysphagia.  To be determined post endoscopy 7.  Better diabetic control is imperative.

## 2020-07-30 ENCOUNTER — Encounter: Payer: Self-pay | Admitting: Adult Health Nurse Practitioner

## 2020-07-30 ENCOUNTER — Other Ambulatory Visit: Payer: Self-pay

## 2020-07-30 ENCOUNTER — Ambulatory Visit (INDEPENDENT_AMBULATORY_CARE_PROVIDER_SITE_OTHER): Payer: Medicare Other | Admitting: Adult Health Nurse Practitioner

## 2020-07-30 VITALS — BP 140/82 | HR 53 | Temp 97.9°F | Ht 66.0 in | Wt 188.0 lb

## 2020-07-30 DIAGNOSIS — R1012 Left upper quadrant pain: Secondary | ICD-10-CM | POA: Diagnosis not present

## 2020-07-30 DIAGNOSIS — R1032 Left lower quadrant pain: Secondary | ICD-10-CM | POA: Diagnosis not present

## 2020-07-30 DIAGNOSIS — E1122 Type 2 diabetes mellitus with diabetic chronic kidney disease: Secondary | ICD-10-CM

## 2020-07-30 DIAGNOSIS — I779 Disorder of arteries and arterioles, unspecified: Secondary | ICD-10-CM

## 2020-07-30 DIAGNOSIS — Z794 Long term (current) use of insulin: Secondary | ICD-10-CM

## 2020-07-30 DIAGNOSIS — I10 Essential (primary) hypertension: Secondary | ICD-10-CM

## 2020-07-30 DIAGNOSIS — N184 Chronic kidney disease, stage 4 (severe): Secondary | ICD-10-CM | POA: Diagnosis not present

## 2020-07-30 MED ORDER — METRONIDAZOLE 500 MG PO TABS: 500.0000 mg | ORAL_TABLET | Freq: Three times a day (TID) | ORAL | 0 refills | Status: AC

## 2020-07-30 MED ORDER — SULFAMETHOXAZOLE-TRIMETHOPRIM 800-160 MG PO TABS: 1.0000 | ORAL_TABLET | Freq: Two times a day (BID) | ORAL | 0 refills | Status: DC

## 2020-07-30 NOTE — Progress Notes (Signed)
Addendum: Discussed lab results with patient via telephone. Concern for kidney function decline and planning to request patient go to hospital for hydration..  Patient reports she feels 90% better since yesterday and starting antibiotic management.  She was getting ready to go get abdominal x-ray.  Discussed the NEED TO INCREASE WATER INTAKE, to prevent hospitalization with close follow up.  Will stop by office this am for repeat CMP. Will also need Renal ultrasound related to chronic kidney function decline.    Assessment and Plan:  Pailyn was seen today for abdominal pain.  Diagnoses and all orders for this visit:  Left lower quadrant abdominal pain Discussed abdominal X-ray in morning. -     CBC with Differential/Platelet -     COMPLETE METABOLIC PANEL WITH GFR -     DG Abd 2 Views; Future -     metroNIDAZOLE (FLAGYL) 500 MG tablet; Take 1 tablet (500 mg total) by mouth 3 (three) times daily for 7 days. -     sulfamethoxazole-trimethoprim (BACTRIM DS) 800-160 MG tablet; Take 1 tablet by mouth 2 (two) times daily.  LUQ abdominal pain -     Epstein-Barr virus VCA antibody panel  Type 2 diabetes mellitus with stage 4 chronic kidney disease, with long-term current use of insulin (HCC) Continue medications follow with Dr Tobe Sos Compliance with set regiment? Discussed general issues about diabetes pathophysiology and management. Education: Reviewed 'ABCs' of diabetes management (respective goals in parentheses):  A1C (<7), blood pressure (<130/80), and cholesterol (LDL <70) Dietary recommendations Encouraged aerobic exercise.  Discussed foot care, check daily Yearly retinal exam Dental exam every 6 months Monitor blood glucose, discussed goal for patient   Essential hypertension Continue current medications: olmesartan 37m, amlodapine 17m bisoprolol 10/6.25. Monitor blood pressure at home; call if consistently over 130/80 Continue DASH diet.   Reminder to go to the ER if any CP,  SOB, nausea, dizziness, severe HA, changes vision/speech, left arm numbness and tingling and jaw pain. Elevated today likely related to pain.   Discussed multiple etiologies.  Concern for gastritis with DMII or diverticulitis per diverticulosis per colonoscopy? And rule out obstruction.  Discussed strict hospital precautions for patient.  Will treat presumptive diverticulitis as patient does not want to go to hospital and not able to obtain imaging this evening.  Follow up pending lab/imaging results.   Further disposition pending results of labs. Discussed med's effects and SE's.   Over 30 minutes of face to face interview, exam, counseling, chart review, and critical decision making was performed.   Future Appointments  Date Time Provider De[place]11/15/2021  2:00 PM McGarnet SierrasNP GAAM-GAAIM None  08/26/2020  2:00 PM PeIrene ShipperMD LBGI-LEC LBPCEndo  09/22/2020 11:00 AM GhPhilemon KingdomMD LBPC-LBENDO None  10/16/2020  4:00 PM GeGrace IsaacMD TCTS-CARGSO TCTSG  12/15/2020 10:30 AM McGarnet SierrasNP GAAM-GAAIM None    ------------------------------------------------------------------------------------------------------------------   HPI [id number].o.female presents for evaluation of abdominal pain that started three days ago.  She reports she is having severeal pain 10/10 that is constant.  She reports that walking or sitting she is having lots of pressure.  It starts on her right side and radiates to her flank area and low her bottom.  Her LBM: one day ago, Type 1, which is abnormal for her. She is typically type 4 once a day.  She has a colonoscopy and endoscopy schedule 08/26/20 Had a consult with Dr PeHenrene Pastor She has history of acid reflux and reports recently  she has had mouth watering/emesis with accompanied with nausea.  Until this evaluation she is taking 3m of omeprazole, she is taking BID dosing of this?  Exam: Abdomen distended, tender to light  palpation to LUQ to RLQ, epigastric tendnerness.  No rebound tenderness noted.  She has DMII and taking farxiga, kidney/cardiac protection 517mdaily and novolog insulin 14 units three times a day.  Reports she is not giving herself injections three times a day.  She is also  Injecting TrMicronesia067maily.  She check her blood glucose in the morning.  Reviewing historical labs she has had chronic kidney decline.  She reports she drink 2 6oz mini bottles of water a day.  Past Medical History:  Diagnosis Date  . Abnormal findings on esophagogastroduodenoscopy (EGD) 07/2010  [facility]   [facility]   . [place]neur[place]  in brain x 2  . Anxiety   . Colon polyps   . Diabetes mellitus 1998  . Diverticulosis   . ESOPHAGEAL STRICTURE 08/27/2008  . GERD (gastroesophageal reflux disease)   . Hemorrhoids   . Hiatal hernia   . Hyperlipidemia   . Hypertension 2000  . Migraines   . [place] . Phlebitis    30 years ago  left leg  . [facility] ( [facility] aneurysm )     Allergies  Allergen Reactions  . Ciprofloxacin Other (See Comments)  . Ozempic (0.25 Or 0.5 Mg-Dose) [Semaglutide(0.25 Or 0.5mg53ms)] Nausea And Vomiting  . Plavix [Clopidogrel Bisulfate] Palpitations  . Amoxicillin Itching and Swelling    FACE & EYES SWELL  . Ace Inhibitors   . Ciprofloxacin Hcl Swelling  . Lyrica [Pregabalin]     Itch, gain weight  . Penicillins Other (See Comments)    REACTION: unspecified    Current Outpatient Medications on File Prior to Visit  Medication Sig  . albuterol (VENTOLIN HFA) 108 (90 Base) MCG/ACT inhaler Inhale 2 puffs into the lungs every 4 (four) hours as needed for wheezing or shortness of breath.  . amMarland KitchenODipine (NORVASC) 10 MG tablet TAKE 1 TABLET DAILY FOR BLOOD PRESSURE  . aspirin EC 81 MG tablet Take 81 mg by mouth at bedtime.   . bisoprolol-hydrochlorothiazide (ZIAC) 10-6.25 MG tablet TAKE 1 TABLET BY MOUTH EVERY DAY   . Blood Glucose Monitoring Suppl (ACCU-CHEK AVIVA PLUS) W/DEVICE KIT Check blood sugar up to three times daily  . Cholecalciferol (VITAMIN D PO) Take 5,000 Units by mouth daily.  . CIMarland KitchenNAMON PO Take 1,000 mg by mouth 2 (two) times daily.   . citalopram (CELEXA) 40 MG tablet Take 1 tablet Daily for Mood & Anxiety  . clonazePAM (KLONOPIN) 0.5 MG tablet Take     1/2 - 1 tablet     at Bedtime    ONLY if needed forSleep &  limit to 5 days /week to avoid Addiction & Dementia  . dapagliflozin propanediol (FARXIGA) 10 MG TABS tablet Take 10 mg by mouth daily before breakfast.  . glucose blood (ACCU-CHEK AVIVA PLUS) test strip Use daily to check BS TID Dx. E11.22  . insulin aspart (NOVOLOG FLEXPEN) 100 UNIT/ML FlexPen Inject 6-8 Units into the skin 3 (three) times daily with meals. (Patient taking differently: Inject 14 Units into the skin 3 (three) times daily with meals. )  . insulin degludec (TRESIBA FLEXTOUCH) 200 UNIT/ML FlexTouch Pen Inject 34 units daily under skin (Patient taking differently: Inject 40 Units into the skin  daily. Inject 34 units daily under skin)  . Insulin Pen Needle 29G X 5MM MISC Use with insulin pen 3 times a day.  . Magnesium 500 MG TABS Take 500 mg by mouth daily.  Marland Kitchen olmesartan (BENICAR) 40 MG tablet TAKE 1 TABLET DAILY FOR BLOOD PRESSURE  . Omega-3 Fatty Acids (FISH OIL) 1200 MG CAPS Take 1,200 capsules by mouth daily.   Marland Kitchen omeprazole (PRILOSEC) 40 MG capsule Take 1 capsule (40 mg total) by mouth in the morning and at bedtime.  Marland Kitchen OVER THE COUNTER MEDICATION Take 1 tablet by mouth daily. One a day vitamin Woman's one a day  . rosuvastatin (CRESTOR) 40 MG tablet TAKE 1 /2 TO 1 TABLET DAILY FOR CHOLESTEROL  . Tiotropium Bromide Monohydrate (SPIRIVA RESPIMAT) 2.5 MCG/ACT AERS Inhale 1 puff into the lungs daily.  . vitamin E 400 UNIT capsule Take 400 Units by mouth daily.   No current facility-administered medications on file prior to visit.    ROS: all negative except above.    Physical Exam:  BP 140/82   Pulse (!) 53   Temp 97.9 F (36.6 C)   Ht _0  (1.676 m)   Wt 188 lb (85.3 kg)   SpO2 92%   BMI 30.34 kg/m   General Appearance: Well nourished, in no apparent distress. Eyes: PERRLA, EOMs, conjunctiva no swelling or erythema Sinuses: No Frontal/maxillary tenderness ENT/Mouth: Ext aud canals clear, TMs without erythema, bulging. No erythema, swelling, or exudate on post pharynx.  Tonsils not swollen or erythematous. Hearing normal.  Neck: Supple, thyroid normal.  Respiratory: Respiratory effort normal, BS equal bilaterally without rales, rhonchi, wheezing or stridor.  Cardio: RRR with no MRGs. Brisk peripheral pulses without edema.  Abdomen: Soft distended, + BS hyperactive all quads.  Tender to light palpation LLQ LUQ, no guarding, rebound, hernias, masses. Lymphatics: Non tender without lymphadenopathy.  Musculoskeletal: Full ROM, 5/5 strength, normal gait.  Skin: Warm, dry without rashes, lesions, ecchymosis.  Neuro: Cranial nerves intact. Normal muscle tone, no cerebellar symptoms. Sensation intact.  Psych: Awake and oriented X 3, normal affect, Insight and Judgment appropriate.     Garnet Sierras, NP 4:07 PM [facility]

## 2020-07-30 NOTE — Patient Instructions (Signed)
   INFORMATION ABOUT YOUR XRAY  [facility]  - Please go first thing in the morning, they open at 8:00am Can walk into [address] for an Insurance account manager. They will have the order and take you back. You do not any paper work, I should get the result back today or tomorrow.   Can call [phone] to schedule an appointment if you wish.     We are going to check labs today and will notify you via MyChart with theh lab results.  We will start treatment for presumptive diverticulitis to see if this helps with your symptoms.    Please seek immediate attention if you have persistent severe pain, projective vomiting.

## 2020-07-31 ENCOUNTER — Other Ambulatory Visit: Payer: Self-pay | Admitting: Adult Health Nurse Practitioner

## 2020-07-31 ENCOUNTER — Ambulatory Visit
Admission: RE | Admit: 2020-07-31 | Discharge: 2020-07-31 | Disposition: A | Discharge status: Home / Self Care | Payer: Medicare Other | Source: Ambulatory Visit | Attending: Adult Health Nurse Practitioner | Admitting: Adult Health Nurse Practitioner

## 2020-07-31 ENCOUNTER — Telehealth: Payer: Self-pay

## 2020-07-31 ENCOUNTER — Other Ambulatory Visit: Payer: Medicare Other

## 2020-07-31 DIAGNOSIS — Z794 Long term (current) use of insulin: Secondary | ICD-10-CM

## 2020-07-31 DIAGNOSIS — R109 Unspecified abdominal pain: Secondary | ICD-10-CM | POA: Diagnosis not present

## 2020-07-31 DIAGNOSIS — N184 Chronic kidney disease, stage 4 (severe): Secondary | ICD-10-CM | POA: Diagnosis not present

## 2020-07-31 DIAGNOSIS — R1032 Left lower quadrant pain: Secondary | ICD-10-CM

## 2020-07-31 DIAGNOSIS — E1122 Type 2 diabetes mellitus with diabetic chronic kidney disease: Secondary | ICD-10-CM

## 2020-07-31 LAB — COMPLETE METABOLIC PANEL WITH GFR
AG Ratio: 1.3 (calc) (ref 1.0–2.5)
AG Ratio: 1.3 (calc) (ref 1.0–2.5)
ALT: 11 U/L (ref 6–29)
ALT: 12 U/L (ref 6–29)
AST: 19 U/L (ref 10–35)
AST: 18 U/L (ref 10–35)
Albumin: 3.7 g/dL (ref 3.6–5.1)
Albumin: 3.7 g/dL (ref 3.6–5.1)
Alkaline phosphatase (APISO): 82 U/L (ref 37–153)
Alkaline phosphatase (APISO): 90 U/L (ref 37–153)
BUN/Creatinine Ratio: 13 (calc) (ref 6–22)
BUN/Creatinine Ratio: 15 (calc) (ref 6–22)
BUN: 31 mg/dL — ABNORMAL HIGH (ref 7–25)
BUN: 35 mg/dL — ABNORMAL HIGH (ref 7–25)
CO2: 31 mmol/L (ref 20–32)
CO2: 31 mmol/L (ref 20–32)
Calcium: 9.4 mg/dL (ref 8.6–10.4)
Calcium: 9.7 mg/dL (ref 8.6–10.4)
Chloride: 101 mmol/L (ref 98–110)
Chloride: 103 mmol/L (ref 98–110)
Creat: 2.42 mg/dL — ABNORMAL HIGH (ref 0.60–0.93)
Creat: 2.38 mg/dL — ABNORMAL HIGH (ref 0.60–0.93)
GFR, Est African American: 22 mL/min/{1.73_m2} — ABNORMAL LOW (ref >=60)
GFR, Est African American: 23 mL/min/{1.73_m2} — ABNORMAL LOW (ref >=60)
GFR, Est Non African American: 19 mL/min/{1.73_m2} — ABNORMAL LOW (ref >=60)
GFR, Est Non African American: 20 mL/min/{1.73_m2} — ABNORMAL LOW (ref >=60)
Globulin: 2.9 g/dL (calc) (ref 1.9–3.7)
Globulin: 2.8 g/dL (calc) (ref 1.9–3.7)
Glucose, Bld: 310 mg/dL — ABNORMAL HIGH (ref 65–99)
Glucose, Bld: 210 mg/dL — ABNORMAL HIGH (ref 65–99)
Potassium: 5.2 mmol/L (ref 3.5–5.3)
Potassium: 5.1 mmol/L (ref 3.5–5.3)
Sodium: 139 mmol/L (ref 135–146)
Sodium: 141 mmol/L (ref 135–146)
Total Bilirubin: 0.6 mg/dL (ref 0.2–1.2)
Total Bilirubin: 0.5 mg/dL (ref 0.2–1.2)
Total Protein: 6.6 g/dL (ref 6.1–8.1)
Total Protein: 6.5 g/dL (ref 6.1–8.1)

## 2020-07-31 LAB — CBC WITH DIFFERENTIAL/PLATELET
Absolute Monocytes: 714 cells/uL (ref 200–950)
Basophils Absolute: 67 cells/uL (ref 0–200)
Basophils Relative: 0.8 %
Eosinophils Absolute: 168 cells/uL (ref 15–500)
Eosinophils Relative: 2 %
HCT: 49.4 % — ABNORMAL HIGH (ref 35.0–45.0)
Hemoglobin: 15.7 g/dL — ABNORMAL HIGH (ref 11.7–15.5)
Lymphs Abs: 2797 cells/uL (ref 850–3900)
MCH: 31.6 pg (ref 27.0–33.0)
MCHC: 31.8 g/dL — ABNORMAL LOW (ref 32.0–36.0)
MCV: 99.4 fL (ref 80.0–100.0)
MPV: 11.5 fL (ref 7.5–12.5)
Monocytes Relative: 8.5 %
Neutro Abs: 4654 cells/uL (ref 1500–7800)
Neutrophils Relative %: 55.4 %
Platelets: 223 10*3/uL (ref 140–400)
RBC: 4.97 10*6/uL (ref 3.80–5.10)
RDW: 11.7 % (ref 11.0–15.0)
Total Lymphocyte: 33.3 %
WBC: 8.4 10*3/uL (ref 3.8–10.8)

## 2020-07-31 LAB — EPSTEIN-BARR VIRUS VCA ANTIBODY PANEL
EBV NA IgG: 88.7 U/mL — ABNORMAL HIGH
EBV VCA IgG: >750 U/mL — ABNORMAL HIGH
EBV VCA IgM: <36 U/mL

## 2020-07-31 NOTE — Telephone Encounter (Signed)
Sent patient a MyChart message with her appt information. Nov 15th at 9:30am. I also asked the patient to call  imaging to see if anyone has cancelled. Imaging made the suggestion as well to call & check. [phone] was the number that was given.

## 2020-07-31 NOTE — Progress Notes (Unsigned)
YOU CAN CALL TO MAKE AN ULTRASOUND.. ? ?I have put in an order for an ultrasound for you to have ?You can set them up at your convenience by calling this number  ?[phone] ?You will likely have the ultrasound at [address] ? ?If you have any issues call our office and we will set this up for you.   ?

## 2020-08-04 ENCOUNTER — Ambulatory Visit
Admission: RE | Admit: 2020-08-04 | Discharge: 2020-08-04 | Disposition: A | Discharge status: Home / Self Care | Payer: Medicare Other | Source: Ambulatory Visit | Attending: Adult Health Nurse Practitioner | Admitting: Adult Health Nurse Practitioner

## 2020-08-04 ENCOUNTER — Ambulatory Visit (INDEPENDENT_AMBULATORY_CARE_PROVIDER_SITE_OTHER): Payer: Medicare Other | Admitting: Adult Health Nurse Practitioner

## 2020-08-04 ENCOUNTER — Encounter: Payer: Self-pay | Admitting: Adult Health Nurse Practitioner

## 2020-08-04 ENCOUNTER — Other Ambulatory Visit: Payer: Self-pay

## 2020-08-04 VITALS — BP 136/86 | HR 61 | Temp 97.3°F | Ht 66.0 in | Wt 171.0 lb

## 2020-08-04 DIAGNOSIS — E1340 Other specified diabetes mellitus with diabetic neuropathy, unspecified: Secondary | ICD-10-CM | POA: Diagnosis not present

## 2020-08-04 DIAGNOSIS — J439 Emphysema, unspecified: Secondary | ICD-10-CM

## 2020-08-04 DIAGNOSIS — E1122 Type 2 diabetes mellitus with diabetic chronic kidney disease: Secondary | ICD-10-CM

## 2020-08-04 DIAGNOSIS — N183 Chronic kidney disease, stage 3 unspecified: Secondary | ICD-10-CM | POA: Diagnosis not present

## 2020-08-04 DIAGNOSIS — E559 Vitamin D deficiency, unspecified: Secondary | ICD-10-CM | POA: Diagnosis not present

## 2020-08-04 DIAGNOSIS — E042 Nontoxic multinodular goiter: Secondary | ICD-10-CM

## 2020-08-04 DIAGNOSIS — E538 Deficiency of other specified B group vitamins: Secondary | ICD-10-CM

## 2020-08-04 DIAGNOSIS — I10 Essential (primary) hypertension: Secondary | ICD-10-CM | POA: Diagnosis not present

## 2020-08-04 DIAGNOSIS — E1159 Type 2 diabetes mellitus with other circulatory complications: Secondary | ICD-10-CM

## 2020-08-04 DIAGNOSIS — N184 Chronic kidney disease, stage 4 (severe): Secondary | ICD-10-CM | POA: Diagnosis not present

## 2020-08-04 DIAGNOSIS — I700 Atherosclerosis of aorta: Secondary | ICD-10-CM | POA: Diagnosis not present

## 2020-08-04 DIAGNOSIS — F411 Generalized anxiety disorder: Secondary | ICD-10-CM

## 2020-08-04 DIAGNOSIS — Z8673 Personal history of transient ischemic attack (TIA), and cerebral infarction without residual deficits: Secondary | ICD-10-CM

## 2020-08-04 DIAGNOSIS — F3341 Major depressive disorder, recurrent, in partial remission: Secondary | ICD-10-CM | POA: Diagnosis not present

## 2020-08-04 DIAGNOSIS — Z0001 Encounter for general adult medical examination with abnormal findings: Secondary | ICD-10-CM

## 2020-08-04 DIAGNOSIS — E1169 Type 2 diabetes mellitus with other specified complication: Secondary | ICD-10-CM | POA: Diagnosis not present

## 2020-08-04 DIAGNOSIS — E1351 Other specified diabetes mellitus with diabetic peripheral angiopathy without gangrene: Secondary | ICD-10-CM | POA: Diagnosis not present

## 2020-08-04 DIAGNOSIS — Z794 Long term (current) use of insulin: Secondary | ICD-10-CM

## 2020-08-04 DIAGNOSIS — Z79899 Other long term (current) drug therapy: Secondary | ICD-10-CM

## 2020-08-04 DIAGNOSIS — E785 Hyperlipidemia, unspecified: Secondary | ICD-10-CM | POA: Diagnosis not present

## 2020-08-04 DIAGNOSIS — R1012 Left upper quadrant pain: Secondary | ICD-10-CM

## 2020-08-04 DIAGNOSIS — I779 Disorder of arteries and arterioles, unspecified: Secondary | ICD-10-CM | POA: Diagnosis not present

## 2020-08-04 DIAGNOSIS — R1032 Left lower quadrant pain: Secondary | ICD-10-CM

## 2020-08-04 DIAGNOSIS — Z130 Encounter for screening for diseases of the blood and blood-forming organs and certain disorders involving the immune mechanism: Secondary | ICD-10-CM

## 2020-08-04 NOTE — Progress Notes (Signed)
COMPLETE PHYSICAL   Assessment:    [name] was seen today for annual exam.  Diagnoses and all orders for this visit:  Encounter for routine adult medical exam with abnormal findings Yearly  Essential hypertension Continue current medications: amlodpine 49m, bisoprolol-hctz 10-6.25mg, olmesartan 479mdaily. Monitor blood pressure at home; call if consistently over 130/80 Continue DASH diet.   Reminder to go to the ER if any CP, SOB, nausea, dizziness, severe HA, changes vision/speech, left arm numbness and tingling and jaw pain. -     CBC with Differential/Platelet -     COMPLETE METABOLIC PANEL WITH GFR -     TSH -     Magnesium -     EKG 12-Lead  Hyperlipidemia associated with type 2 diabetes mellitus (HCC) Continue medications:rosuvastatin 1037miscussed dietary and exercise modifications Low fat diet -     Lipid panel  . Anxiety state Continue to monitor  Peripheral vascular disease due to secondary diabetes mellitus (HCC) Control blood pressure, lipids and glucose Disscused lifestyle modifications, diet & exercise Continue to monitor  PAOD (peripheral arterial occlusive disease) (HCC) Increase activity  Type 2 diabetes mellitus with stage 4 chronic kidney disease, with long-term current use of insulin (HCC) Continue medications follow s with Endocrinology Discussed general issues about diabetes pathophysiology and management. Education: Reviewed 'ABCs' of diabetes management (respective goals in parentheses):  A1C (<7), blood pressure (<130/80), and cholesterol (LDL <70) Dietary recommendations Encouraged aerobic exercise.  Discussed foot care, check daily Yearly retinal exam Dental exam every 6 months Monitor blood glucose, discussed goal for patient   History of CVA (cerebrovascular accident) Control blood pressure, lipids and glucose Disscused lifestyle modifications, diet & exercise Continue to monitor  Vitamin D deficiency -     VITAMIN D 25 Hydroxy  (Vit-D Deficiency, Fractures)  CKD stage 4 due to type 2 diabetes mellitus (HCC) Increase fluids, have discussed this at length with patient Avoid NSAIDS Blood pressure control Monitor sugars, needs better glucose control [place] for [place] related to continually decline in kidney function -     Microalbumin / creatinine urine ratio -     Urinalysis, Routine w reflex microscopic -     Iron,Total/Total Iron Binding Cap Renal ultrasound complete, unremarkable  Atherosclerosis of aorta ([facility]CCHunterer CT 06/19/20 Control blood pressure, lipids and glucose Disscused lifestyle modifications, diet & exercise Continue to monitor  Abdominal Pain LUQ/LLQ Diverticulitis? Improved on Bactrim (reduced dosing)/ Flagyl Abdominal film negative Continue to monitor Discussed dietary changes at length.  Depression, major, recurrent, in partial remission (HCCPooleiscussed stress management techniques  Discussed, increase water,intake & good sleep hygiene  Discussed increasing exercise & vegetables in diet  Neuropathy due to secondary diabetes (HCC) Control glucose  Medication management -Continued  Screening, iron deficiency anemia B12 deficiency -     Vitamin B12  Pulmonary emphysema, unspecified emphysema type (HCC) Try to get back on the spiriva  History of CVA (cerebrovascular accident) Control blood pressure, cholesterol, glucose, increase exercise.   Nontoxic multinodular goiter Monitor     Future Appointments  Date Time Provider DepPalisade2/03/2020  2:00 PM PerIrene ShipperD LBGI-LEC LBPCEndo  09/22/2020 11:00 AM GhePhilemon KingdomD LBPC-LBENDO None  10/16/2020  4:00 PM GerGrace IsaacD TCTS-CARGSO TCTSG  12/15/2020 10:30 AM McCGarnet SierrasP GAAM-GAAIM None  08/04/2021  2:00 PM McCGarnet SierrasP GAAM-GAAIM None  '    Subjective:   [name] a 74 26o. female who presents for Medicare Annual  Wellness Visit and 3 month follow up on  hypertension, diabetes, hyperlipidemia, vitamin D def.   Her blood pressure has been controlled at home, today their BP is BP: 136/86   Last OV she had severe abdominal pain, abdominal film, air, early small bowel?  Patient is having regular bowel movements and pain improved after bactrim and flagly, related to cipro allergy.  Her kidney function decrease significantly and we discussed evaluation at hospital.  Since she had improved dramatically, symptoms, did not want to go to hospital.  Did follow up imaging and labs which showed minimal improvement.  Concerns for dramatic decrease accompanied with chronic decline.  Discussed diabetes regimen at length.  She is not taking her novolog as prescribed three times a day.  She is taking fargixa and triseba 34 units daily.  She has not been monitoring her sugar intake.  Discussed changes at length with patient in office.  She has COPD, has lung nodules, last one was CT chest (11/2019) - following with Dr. Servando Snare, will have follow up 6 months. She states the spiriva made her throat very sore after 1 week so she has not used it, she has albuterol as needed  She was following with Dr. Windy Kalata, but he retired and she has not followed up with nuerology. Has history of migraines that has improved with age. She has been having abnormal vision, will reschedule Dr. Herbert Deaner.    She has several comorbidities from her DM including  Neuropathy Retinopathy Dr. Kathlen Mody 11/26/2019- follow up 6 months Hyperlipidemia at goal less than 70, on crestor 40 mg CKD she is on ARB (off metformin due to CKDq)  history of TIA she is on 325 ASA PAD s/p stents   Novolog 70/30 mix on 35 and 30 units-9 Could not tolerate invokana- she is on farxiga and tolerating it well she been working on diet and exercise for diabetes and following with Dr. Darnell Level denies polydipsia, polyuria and visual disturbances.  Last A1C in the office was:  Lab Results  Component Value Date   HGBA1C 9.6 (A)  05/21/2020    Lab Results  Component Value Date   CHOL 118 08/04/2020   HDL 40 (L) 08/04/2020   LDLCALC 55 08/04/2020   LDLDIRECT 146.4 01/30/2010   TRIG 152 (H) 08/04/2020   CHOLHDL 3.0 08/04/2020   Lab Results  Component Value Date   GFRAA 25 (L) 08/04/2020    Patient is on Vitamin D. Lab Results  Component Value Date   VD25OH 21 (L) 08/04/2020     Names of Other Physician/Practitioners you currently use: 1. [facility]- here for primary care 2. Dr. Herbert Deaner, DIABETIC eye doctor, last visit 11/2019 Retinopathy Patient Care Team: [name], MD as PCP - General (Internal Medicine) Minus Breeding, MD as PCP - Cardiology (Cardiology) Michel Santee, MD as Consulting Physician (Neurology) Serafina Mitchell, MD as Consulting Physician (Vascular Surgery) Irene Shipper, MD as Consulting Physician (Gastroenterology) Gardiner Barefoot, DPM as Consulting Physician (Podiatry) Hortencia Pilar, MD as Consulting Physician (Surgery) Grace Isaac, MD as Consulting Physician (Cardiothoracic Surgery)  Medication Review  Current Outpatient Medications (Endocrine & Metabolic):  .  dapagliflozin propanediol (FARXIGA) 10 MG TABS tablet, Take 10 mg by mouth daily before breakfast. .  insulin aspart (NOVOLOG FLEXPEN) 100 UNIT/ML FlexPen, Inject 6-8 Units into the skin 3 (three) times daily with meals. (Patient taking differently: Inject 14 Units into the skin 3 (three) times daily with meals. ) .  insulin  degludec (TRESIBA FLEXTOUCH) 200 UNIT/ML FlexTouch Pen, Inject 34 units daily under skin (Patient taking differently: Inject 40 Units into the skin daily. Inject 34 units daily under skin)  Current Outpatient Medications (Cardiovascular):  .  amLODipine (NORVASC) 10 MG tablet, TAKE 1 TABLET DAILY FOR BLOOD PRESSURE .  bisoprolol-hydrochlorothiazide (ZIAC) 10-6.25 MG tablet, TAKE 1 TABLET BY MOUTH EVERY DAY .  olmesartan (BENICAR) 40 MG tablet,  TAKE 1 TABLET DAILY FOR BLOOD PRESSURE .  rosuvastatin (CRESTOR) 40 MG tablet, TAKE 1 /2 TO 1 TABLET DAILY FOR CHOLESTEROL  Current Outpatient Medications (Respiratory):  .  albuterol (VENTOLIN HFA) 108 (90 Base) MCG/ACT inhaler, Inhale 2 puffs into the lungs every 4 (four) hours as needed for wheezing or shortness of breath. .  Tiotropium Bromide Monohydrate (SPIRIVA RESPIMAT) 2.5 MCG/ACT AERS, Inhale 1 puff into the lungs daily.  Current Outpatient Medications (Analgesics):  .  aspirin EC 81 MG tablet, Take 81 mg by mouth at bedtime.    Current Outpatient Medications (Other):  .  Blood Glucose Monitoring Suppl (ACCU-CHEK AVIVA PLUS) W/DEVICE KIT, Check blood sugar up to three times daily .  Cholecalciferol (VITAMIN D PO), Take 5,000 Units by mouth daily. Marland Kitchen  CINNAMON PO, Take 1,000 mg by mouth 2 (two) times daily.  .  citalopram (CELEXA) 40 MG tablet, Take 1 tablet Daily for Mood & Anxiety .  clonazePAM (KLONOPIN) 0.5 MG tablet, Take     1/2 - 1 tablet     at Bedtime    ONLY if needed forSleep &  limit to 5 days /week to avoid Addiction & Dementia .  glucose blood (ACCU-CHEK AVIVA PLUS) test strip, Use daily to check BS TID Dx. E11.22 .  Insulin Pen Needle 29G X 5MM MISC, Use with insulin pen 3 times a day. .  Magnesium 500 MG TABS, Take 500 mg by mouth daily. .  metroNIDAZOLE (FLAGYL) 500 MG tablet, Take 1 tablet (500 mg total) by mouth 3 (three) times daily for 7 days. .  Omega-3 Fatty Acids (FISH OIL) 1200 MG CAPS, Take 1,200 capsules by mouth daily.  Marland Kitchen  omeprazole (PRILOSEC) 40 MG capsule, Take 1 capsule (40 mg total) by mouth in the morning and at bedtime. Marland Kitchen  OVER THE COUNTER MEDICATION, Take 1 tablet by mouth daily. One a day vitamin Woman's one a day .  sulfamethoxazole-trimethoprim (BACTRIM DS) 800-160 MG tablet, Take 1 tablet by mouth 2 (two) times daily. .  vitamin E 400 UNIT capsule, Take 400 Units by mouth daily.  Current Problems (verified) Patient Active Problem List    Diagnosis Date Noted  . Carotid artery stenosis 12/03/2019  . Migraine with aura and without status migrainosus, not intractable 11/28/2019  . Hyperlipidemia associated with type 2 diabetes mellitus ([place]) 11/10/2019  . Coronary artery calcification 08/05/2019  . PAOD (peripheral arterial occlusive disease) ([place]) 02/26/2019  . SOB (shortness of breath) 11/17/2018  . Cardiomegaly 11/17/2018  . Emphysema of lung ([place]) 10/10/2017  . Atherosclerosis of aorta ([place]) 10/10/2017  . Lung nodule 10/10/2017  . Anxiety 08/22/2017  . Arthritis 08/22/2017  . Neuropathy due to secondary diabetes ([place]) 08/22/2017  . PVD (peripheral vascular disease) ([place]) 04/13/2017  . Type 2 diabetes mellitus with circulatory disorder, with long-term current use of insulin ([place]) 11/07/2015  . Encounter for Medicare annual wellness exam 06/24/2015  . Generalized anxiety disorder 03/26/2015  . Depression, major, recurrent, in partial remission ([place]) 03/26/2015  . History of CVA (cerebrovascular accident) 10/04/2014  . Tobacco abuse 10/04/2014  .  Vitamin D deficiency 11/02/2013  . Medication management 11/02/2013  . 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 Atherosclerosis of native arteries of extremity with intermittent claudication 12/24/2011  . Diverticulosis of large intestine 06/09/2010  . History of colonic polyps 06/09/2010  . Esophageal reflux 08/27/2008  . Nontoxic multinodular goiter 03/11/2008  . 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 Brain aneurysm 03/11/2008  . CKD stage 3 due to type 2 diabetes mellitus ([place]) 05/01/2007  . Essential hypertension 05/01/2007    Screening Tests Immunization History  Administered Date(s) Administered  . DT (Pediatric) 03/26/2015  . Fluad Quad(high Dose 65+) 07/09/2019  . Influenza Whole 08/02/2007, 06/18/2009, 07/09/2013  . Influenza, High Dose Seasonal PF 08/05/2015, 07/22/2016, 07/04/2017, 06/30/2020  . PFIZER  SARS-COV-2 Vaccination 06/30/2020  . Pneumococcal Conjugate-13 07/22/2016  . Pneumococcal Polysaccharide-23 02/08/2014  . Td 09/21/2004  . Unspecified SARS-COV-2 Vaccination 10/13/2019, 11/03/2019   Health Maintenance  Topic Date Due  . FOOT EXAM  04/19/2019  . COLONOSCOPY  09/21/2019  . MAMMOGRAM  10/06/2019  . HEMOGLOBIN A1C  11/18/2020  . OPHTHALMOLOGY EXAM  04/28/2021  . TETANUS/TDAP  03/25/2025  . INFLUENZA VACCINE  Completed  . DEXA SCAN  Completed  . COVID-19 Vaccine  Completed  . Hepatitis C Screening  Completed  . PNA vac Low Risk Adult  Completed    Preventative care: Last colonoscopy: 2011 Dr. Henrene Pastor due 10 years Last mammogram: 09/2017 has scheduled Last pap smear/pelvic exam: 2007  DEXA: 2016 OVERDUE Echo 01/2017 EF 60-65% ABI 2014 MRI/MRA  brain 05.2018 IMPRESSION: 1 cm acute infarct left lateral thalamus and internal capsule Chronic infarct right cerebellum Moderate stenosis left posterior cerebral artery. No other significant intracranial stenosis.  Ct chest WO contrast 11/2019  Prior vaccinations: TD or Tdap: 2016  Influenza: 20203 Pneumococcal: 2015 Prevnar 13:2017 Shingles/Zostavax: Discussed with patient  Allergies Allergies  Allergen Reactions  . Ciprofloxacin Other (See Comments)  . Ozempic (0.25 Or 0.5 Mg-Dose) [Semaglutide(0.25 Or 0.59m-Dos)] Nausea And Vomiting  . Plavix [Clopidogrel Bisulfate] Palpitations  . Amoxicillin Itching and Swelling    FACE & EYES SWELL  . Ace Inhibitors   . Ciprofloxacin Hcl Swelling  . Lyrica [Pregabalin]     Itch, gain weight  . Penicillins Other (See Comments)    REACTION: unspecified    SURGICAL HISTORY She  has a past surgical history that includes Cesarean section; Cholecystectomy; Knee surgery; rotator cuff surgery; Thyroid surgery; Hernia repair; Abdominal hysterectomy (1984); Femoral artery stent (05/11/11); Dental surgery (Aug. 16, 2013); Eye surgery (Nov. 2014); Esophagogastroduodenoscopy  (07/2010); Colonoscopy (07/2010); abdominal aortagram (N/A, 01/04/2012); abdominal aortagram (N/A, 08/15/2012); lower extremity angiogram (Left, 08/15/2012); cataract surgery (Right, 10-22-2015); and cataract surgery (Left, 11-05-2015). FAMILY HISTORY Her family history includes CAD (age of onset: 621 in her brother; CAD (age of onset: 675 in her father and mother; Diabetes in her sister; Heart attack in her brother, brother, father, and mother; Heart disease in her brother, brother, father, and mother; Hypertension in her mother and sister; Stroke in her sister. SOCIAL HISTORY She  reports that she has been smoking cigarettes. She has a 10.00 pack-year smoking history. She has never used smokeless tobacco. She reports that she does not drink alcohol and does not use drugs.  Objective:   Blood pressure 136/86, pulse 61, temperature (!) 97.3 F (36.3 C), height 5' 6"  (1.676 m), weight 171 lb (77.6 kg), SpO2 95 %. Body mass index is 27.6 kg/m.  General appearance: alert, no distress, WD/WN,  female HEENT: normocephalic, sclerae anicteric, TMs pearly, nares patent, no discharge or erythema, pharynx normal. Oral cavity: MMM, no lesions Neck: supple, no lymphadenopathy, no thyromegaly, no masses Heart: RRR, normal S1, S2, no murmurs Lungs: CTA bilaterally, no wheezes, rhonchi, or rales Abdomen: +bs, soft, non tender, non distended, no masses, no hepatomegaly, no splenomegaly Musculoskeletal: nontender, no swelling, no obvious deformity Extremities: no edema, no cyanosis, no clubbing Pulses: 1+ symmetric, upper and lower extremities, normal cap refill Neurological: alert, oriented x 3, CN2-12 intact, strength normal upper extremities and lower extremities, sensation  decreased throughout bottom of bilateral feet, DTRs 2+ throughout, no cerebellar signs, gait normal Psychiatric: normal affect, behavior normal, pleasant  Breast: defer Gyn: defer Rectal: defer     Garnet Sierras,  NP   08/05/2020

## 2020-08-04 NOTE — Patient Instructions (Addendum)
Continue modifying your diet.  Decrease carbohydrates, bread rice, pasta, potatoes and sugar.  Continue good habits with eating fish and green vegetables.  Also keep up with your water intake to keep your kidneys filtering for you.  The more water you put in the more toxins they flush out of your body.   Be sure you are taking your insulin three times a day.  This will help to lower your morning blood glucose.  Take your fast acting, Novolog right before you are going to put food into your mouth.  Do this for all three meals.   You are due for mammogram and bone density scan.  You can get both of these at the [facility].  HOW TO SCHEDULE Mammogram and Bone Density, DEXA  [facility]  7 a.m.-6:30 p.m., Monday 7 a.m.-5 p.m., Tuesday-Friday Schedule an appointment by calling [phone].       Focus on eating lean protein, high-fiber, less processed carbs, fruits(in moderation), and vegetables, low-fat dairy, and healthy vegetable-based fats such as avocado, nuts, canola oil, or olive oil.   Blood sugar control and a healthy weight should be your goal.  A good starting point for a woman is 1,400-1,600 calories a day, with main meals containing up to 30 grams of fiber-rich carbohydrates, and snacks containing 10-20 grams of fiber-rich carbohydrates.  Men are typically more physically active and should start with a 2,000-2,200 calorie meal plan, in which you may increase carbohydrates proportional to your activities.  Recent research suggests that by eating a big breakfast, and a modest lunch, so you get most of your calories in by 3 pm, you will find it easier to lose weight and achieve better blood sugar control.  A high fiber diet--one that contains at least 25 to 35 grams of dietary fiber a day--is essential for good health, and is the key for people with diabetes because fiber helps slow down the absorption of all sugars--those that are naturally  forming like in fruits and starches, as well as any refined sugars you consume--in your bloodstream.         Create well-balanced meals (including protein, fat and fiber-rich carbs), they are generally more satisfying.  Doing this decreses chances of being hungry between meals so less likely to look for a quick fix that will cause your blood sugar to soar, and your body to store those unneeded calories as fat.  Good sources of fiber include:      Pulses (like lentils and peas) and beans and legumes (think black beans, kidney beans, pintos, chickpeas and white beans)      Fruits and vegetables, especially those with edible skin (like apples and beans) and those with edible seeds (like berries)     Nuts--try different kinds (peanuts, walnuts and almonds are a good source of fiber and healthy fat, but watch portion sizes, because they also contain a lot of calories in a small amount!)     Whole grains such as:         Quinoa, barley, brown rice and farro         Whole wheat pasta         Whole grain cereals, including those made from whole wheat, wheat bran and oats   What foods contain heart-healthy fats? These include olive oil and oils made from nuts (eg, walnut oil, peanut oil), avocado, fatty fish l(eg, [place] salmon, mackerel, herring, and [place] trout), nuts and seeds.  The  key to a balanced diet is planning meals using the diabetes plate method--divide the plate into quarters:  protein or meat, 1/4 carbs, and 2/4 (=1/2) vegetable and fruit.6 If you want to lose weight, use 9-inch dinner plates and bowls so you arent piling the food on to a large dinner plate.  For example, fill half the plate with non-starchy carbs such as salad greens or steamed broccoli, and fill the remaining half of the plate with equal portions of a grain or starchy vegetable like mashed sweet potato, and a heart-healthy protein such as broiled salmon.   Diabetes Plate Method  Check out the site below for more  detailed information and examples.  [employer]

## 2020-08-04 NOTE — Progress Notes (Signed)
Discussed with patient in office today, WNL.   [name], [name], DNP [facility] 08/04/2020  2:40PM

## 2020-08-05 ENCOUNTER — Other Ambulatory Visit: Payer: Self-pay | Admitting: Adult Health Nurse Practitioner

## 2020-08-05 DIAGNOSIS — E1022 Type 1 diabetes mellitus with diabetic chronic kidney disease: Secondary | ICD-10-CM

## 2020-08-05 DIAGNOSIS — E1122 Type 2 diabetes mellitus with diabetic chronic kidney disease: Secondary | ICD-10-CM

## 2020-08-05 DIAGNOSIS — N184 Chronic kidney disease, stage 4 (severe): Secondary | ICD-10-CM

## 2020-08-05 LAB — URINALYSIS, ROUTINE W REFLEX MICROSCOPIC
Bacteria, UA: NONE SEEN /HPF
Bilirubin Urine: NEGATIVE
Hgb urine dipstick: NEGATIVE
Hyaline Cast: NONE SEEN /LPF
Ketones, ur: NEGATIVE
Leukocytes,Ua: NEGATIVE
Nitrite: NEGATIVE
RBC / HPF: NONE SEEN /HPF (ref 0–2)
Specific Gravity, Urine: 1.027 (ref 1.001–1.03)
Squamous Epithelial / HPF: NONE SEEN /HPF (ref <=5)
WBC, UA: NONE SEEN /HPF (ref 0–5)
pH: <=5 (ref 5.0–8.0)

## 2020-08-05 LAB — MICROALBUMIN / CREATININE URINE RATIO
Creatinine, Urine: 71 mg/dL (ref 20–275)
Microalb Creat Ratio: 476 mcg/mg creat — ABNORMAL HIGH (ref <30)
Microalb, Ur: 33.8 mg/dL

## 2020-08-05 LAB — LIPID PANEL
Cholesterol: 118 mg/dL (ref <200)
HDL: 40 mg/dL — ABNORMAL LOW (ref >=50)
LDL Cholesterol (Calc): 55 mg/dL (calc)
Non-HDL Cholesterol (Calc): 78 mg/dL (calc) (ref <130)
Total CHOL/HDL Ratio: 3 (calc) (ref <5.0)
Triglycerides: 152 mg/dL — ABNORMAL HIGH (ref <150)

## 2020-08-05 LAB — CBC WITH DIFFERENTIAL/PLATELET
Absolute Monocytes: 805 cells/uL (ref 200–950)
Basophils Absolute: 68 cells/uL (ref 0–200)
Basophils Relative: 0.7 %
Eosinophils Absolute: 136 cells/uL (ref 15–500)
Eosinophils Relative: 1.4 %
HCT: 48.2 % — ABNORMAL HIGH (ref 35.0–45.0)
Hemoglobin: 15.4 g/dL (ref 11.7–15.5)
Lymphs Abs: 3347 cells/uL (ref 850–3900)
MCH: 31.3 pg (ref 27.0–33.0)
MCHC: 32 g/dL (ref 32.0–36.0)
MCV: 98 fL (ref 80.0–100.0)
MPV: 11.4 fL (ref 7.5–12.5)
Monocytes Relative: 8.3 %
Neutro Abs: 5345 cells/uL (ref 1500–7800)
Neutrophils Relative %: 55.1 %
Platelets: 234 10*3/uL (ref 140–400)
RBC: 4.92 10*6/uL (ref 3.80–5.10)
RDW: 11.8 % (ref 11.0–15.0)
Total Lymphocyte: 34.5 %
WBC: 9.7 10*3/uL (ref 3.8–10.8)

## 2020-08-05 LAB — IRON, TOTAL/TOTAL IRON BINDING CAP
%SAT: 31 % (calc) (ref 16–45)
Iron: 149 ug/dL (ref 45–160)
TIBC: 476 mcg/dL (calc) — ABNORMAL HIGH (ref 250–450)

## 2020-08-05 LAB — COMPLETE METABOLIC PANEL WITH GFR
AG Ratio: 1.5 (calc) (ref 1.0–2.5)
ALT: 13 U/L (ref 6–29)
AST: 21 U/L (ref 10–35)
Albumin: 3.9 g/dL (ref 3.6–5.1)
Alkaline phosphatase (APISO): 85 U/L (ref 37–153)
BUN/Creatinine Ratio: 16 (calc) (ref 6–22)
BUN: 35 mg/dL — ABNORMAL HIGH (ref 7–25)
CO2: 29 mmol/L (ref 20–32)
Calcium: 9.5 mg/dL (ref 8.6–10.4)
Chloride: 103 mmol/L (ref 98–110)
Creat: 2.2 mg/dL — ABNORMAL HIGH (ref 0.60–0.93)
GFR, Est African American: 25 mL/min/{1.73_m2} — ABNORMAL LOW (ref >=60)
GFR, Est Non African American: 22 mL/min/{1.73_m2} — ABNORMAL LOW (ref >=60)
Globulin: 2.6 g/dL (calc) (ref 1.9–3.7)
Glucose, Bld: 168 mg/dL — ABNORMAL HIGH (ref 65–99)
Potassium: 4.6 mmol/L (ref 3.5–5.3)
Sodium: 141 mmol/L (ref 135–146)
Total Bilirubin: 0.5 mg/dL (ref 0.2–1.2)
Total Protein: 6.5 g/dL (ref 6.1–8.1)

## 2020-08-05 LAB — MAGNESIUM: Magnesium: 2.5 mg/dL (ref 1.5–2.5)

## 2020-08-05 LAB — TSH: TSH: 1.58 mIU/L (ref 0.40–4.50)

## 2020-08-05 LAB — VITAMIN B12: Vitamin B-12: 410 pg/mL (ref 200–1100)

## 2020-08-05 LAB — VITAMIN D 25 HYDROXY (VIT D DEFICIENCY, FRACTURES): Vit D, 25-Hydroxy: 21 ng/mL — ABNORMAL LOW (ref 30–100)

## 2020-08-06 ENCOUNTER — Other Ambulatory Visit: Payer: Self-pay | Admitting: Internal Medicine

## 2020-08-06 ENCOUNTER — Other Ambulatory Visit: Payer: Self-pay | Admitting: Adult Health Nurse Practitioner

## 2020-08-06 DIAGNOSIS — F411 Generalized anxiety disorder: Secondary | ICD-10-CM

## 2020-08-18 ENCOUNTER — Other Ambulatory Visit: Payer: Self-pay | Admitting: Internal Medicine

## 2020-08-18 DIAGNOSIS — R609 Edema, unspecified: Secondary | ICD-10-CM | POA: Diagnosis not present

## 2020-08-18 DIAGNOSIS — N184 Chronic kidney disease, stage 4 (severe): Secondary | ICD-10-CM | POA: Diagnosis not present

## 2020-08-18 DIAGNOSIS — F419 Anxiety disorder, unspecified: Secondary | ICD-10-CM

## 2020-08-18 DIAGNOSIS — Z720 Tobacco use: Secondary | ICD-10-CM | POA: Diagnosis not present

## 2020-08-18 DIAGNOSIS — R809 Proteinuria, unspecified: Secondary | ICD-10-CM | POA: Diagnosis not present

## 2020-08-18 DIAGNOSIS — I129 Hypertensive chronic kidney disease with stage 1 through stage 4 chronic kidney disease, or unspecified chronic kidney disease: Secondary | ICD-10-CM | POA: Diagnosis not present

## 2020-08-18 DIAGNOSIS — E1122 Type 2 diabetes mellitus with diabetic chronic kidney disease: Secondary | ICD-10-CM | POA: Diagnosis not present

## 2020-08-19 DIAGNOSIS — N184 Chronic kidney disease, stage 4 (severe): Secondary | ICD-10-CM | POA: Diagnosis not present

## 2020-08-19 DIAGNOSIS — R809 Proteinuria, unspecified: Secondary | ICD-10-CM | POA: Diagnosis not present

## 2020-08-26 ENCOUNTER — Ambulatory Visit (AMBULATORY_SURGERY_CENTER): Payer: Medicare Other | Admitting: Internal Medicine

## 2020-08-26 ENCOUNTER — Encounter: Payer: Self-pay | Admitting: Internal Medicine

## 2020-08-26 ENCOUNTER — Other Ambulatory Visit: Payer: Self-pay

## 2020-08-26 VITALS — BP 173/72 | HR 60 | Temp 96.5°F | Resp 22 | Ht 66.0 in | Wt 171.0 lb

## 2020-08-26 DIAGNOSIS — K209 Esophagitis, unspecified without bleeding: Secondary | ICD-10-CM | POA: Diagnosis not present

## 2020-08-26 DIAGNOSIS — K31819 Angiodysplasia of stomach and duodenum without bleeding: Secondary | ICD-10-CM

## 2020-08-26 DIAGNOSIS — K449 Diaphragmatic hernia without obstruction or gangrene: Secondary | ICD-10-CM

## 2020-08-26 DIAGNOSIS — Z1211 Encounter for screening for malignant neoplasm of colon: Secondary | ICD-10-CM

## 2020-08-26 DIAGNOSIS — R1310 Dysphagia, unspecified: Secondary | ICD-10-CM | POA: Diagnosis not present

## 2020-08-26 DIAGNOSIS — D123 Benign neoplasm of transverse colon: Secondary | ICD-10-CM

## 2020-08-26 DIAGNOSIS — R112 Nausea with vomiting, unspecified: Secondary | ICD-10-CM

## 2020-08-26 DIAGNOSIS — K219 Gastro-esophageal reflux disease without esophagitis: Secondary | ICD-10-CM | POA: Diagnosis not present

## 2020-08-26 DIAGNOSIS — D124 Benign neoplasm of descending colon: Secondary | ICD-10-CM

## 2020-08-26 DIAGNOSIS — K635 Polyp of colon: Secondary | ICD-10-CM | POA: Diagnosis not present

## 2020-08-26 MED ORDER — SODIUM CHLORIDE 0.9 % IV SOLN: 500.0000 mL | Freq: Once | INTRAVENOUS | Status: DC

## 2020-08-26 NOTE — Patient Instructions (Signed)
HANDOUTS PROVIDED ON: HIATAL HERNIA, POLYPS, & DIVERTICULOSIS  The polyps removed today have been sent for pathology.  The results can take 1-3 weeks to receive.  When your next colonoscopy should occur will be based on the pathology results.    You may resume your previous diet and medication schedule.  Thank you for allowing [place] to care for you today!!!   YOU HAD AN ENDOSCOPIC PROCEDURE TODAY AT [place]:   Refer to the procedure report that was given to you for any specific questions about what was found during the examination.  If the procedure report does not answer your questions, please call your gastroenterologist to clarify.  If you requested that your care partner not be given the details of your procedure findings, then the procedure report has been included in a sealed envelope for you to review at your convenience later.  YOU SHOULD EXPECT: Some feelings of bloating in the abdomen. Passage of more gas than usual.  Walking can help get rid of the air that was put into your GI tract during the procedure and reduce the bloating. If you had a lower endoscopy (such as a colonoscopy or flexible sigmoidoscopy) you may notice spotting of blood in your stool or on the toilet paper. If you underwent a bowel prep for your procedure, you may not have a normal bowel movement for a few days.  Please Note:  You might notice some irritation and congestion in your nose or some drainage.  This is from the oxygen used during your procedure.  There is no need for concern and it should clear up in a day or so.  SYMPTOMS TO REPORT IMMEDIATELY:   Following lower endoscopy (colonoscopy or flexible sigmoidoscopy):  Excessive amounts of blood in the stool  Significant tenderness or worsening of abdominal pains  Swelling of the abdomen that is new, acute  Fever of 100F or higher   Following upper endoscopy (EGD)  Vomiting of blood or coffee ground material  New chest pain or pain  under the shoulder blades  Painful or persistently difficult swallowing  New shortness of breath  Fever of 100F or higher  Black, tarry-looking stools  For urgent or emergent issues, a gastroenterologist can be reached at any hour by calling [phone]. Do not use MyChart messaging for urgent concerns.    DIET:  We do recommend a small meal at first, but then you may proceed to your regular diet.  Drink plenty of fluids but you should avoid alcoholic beverages for 24 hours.  ACTIVITY:  You should plan to take it easy for the rest of today and you should NOT DRIVE or use heavy machinery until tomorrow (because of the sedation medicines used during the test).    FOLLOW UP: Our staff will call the number listed on your records Thursday morning between 7:15 am and 8:15 am to check on you and address any questions or concerns that you may have regarding the information given to you following your procedure. If we do not reach you, we will leave a message.  We will attempt to reach you two times.  During this call, we will ask if you have developed any symptoms of COVID 19. If you develop any symptoms (ie: fever, flu-like symptoms, shortness of breath, cough etc.) before then, please call [phone].  If you test positive for Covid 19 in the 2 weeks post procedure, please call and report this information to [place].    If any  biopsies were taken you will be contacted by phone or by letter within the next 1-3 weeks.  Please call us at [phone] if you have not heard about the biopsies in 3 weeks.    SIGNATURES/CONFIDENTIALITY: You and/or your care partner have signed paperwork which will be entered into your electronic medical record.  These signatures attest to the fact that that the information above on your After Visit Summary has been reviewed and is understood.  Full responsibility of the confidentiality of this discharge information lies with you and/or your care-partner.

## 2020-08-26 NOTE — Progress Notes (Signed)
Called to room to assist during endoscopic procedure.  Patient ID and intended procedure confirmed with present staff. Received instructions for my participation in the procedure from the performing physician.  

## 2020-08-26 NOTE — Op Note (Signed)
[place] Patient Name: [name] Procedure Date: 08/26/2020 1:58 PM MRN: [id number] Endoscopist: Docia Chuck. Henrene Pastor , MD Age: 74 Referring MD:  Date of Birth: 1946/09/13 Gender: Female Account #: [id number] Procedure:                Upper GI endoscopy Indications:              Dysphagia, Esophageal reflux symptoms that persist                            despite appropriate therapy, Nausea with vomiting.                            NOTE seen in the office 1 month ago. She states                            that she is doing better with higher dose PPI Medicines:                Monitored Anesthesia Care Procedure:                Pre-Anesthesia Assessment:                           - Prior to the procedure, a History and Physical                            was performed, and patient medications and                            allergies were reviewed. The patient's tolerance of                            previous anesthesia was also reviewed. The risks                            and benefits of the procedure and the sedation                            options and risks were discussed with the patient.                            All questions were answered, and informed consent                            was obtained. Prior Anticoagulants: The patient has                            taken no previous anticoagulant or antiplatelet                            agents. ASA Grade Assessment: II - A patient with                            mild systemic disease. After reviewing the risks  and benefits, the patient was deemed in                            satisfactory condition to undergo the procedure.                           After obtaining informed consent, the endoscope was                            passed under direct vision. Throughout the                            procedure, the patient's blood pressure, pulse, and                            oxygen  saturations were monitored continuously. The                            Endoscope was introduced through the mouth, and                            advanced to the second part of duodenum. The upper                            GI endoscopy was accomplished without difficulty.                            The patient tolerated the procedure well. Scope In: Scope Out: Findings:                 The esophagus was normal.                           The stomach revealed a small sliding hiatal hernia,                            but was otherwise normal.                           The examined duodenum revealed a small nonbleeding                            AVM in the bulb as well as in the second portion of                            the duodenum.                           The cardia and gastric fundus were normal on                            retroflexion. Complications:            No immediate complications. Estimated Blood Loss:     Estimated blood loss: none. Impression:  1. GERD                           2. Incidental duodenal AVMs                           3. Otherwise normal EGD. Recommendation:           - Patient has a contact number available for                            emergencies. The signs and symptoms of potential                            delayed complications were discussed with the                            patient. Return to normal activities tomorrow.                            Written discharge instructions were provided to the                            patient.                           - Resume previous diet.                           - Continue present medications.                           - Continue higher dose omeprazole (total of 40 mg                            daily)                           - Reflux precautions                           - Good diabetic control                           - GI follow-up as needed Monie Shere N. Henrene Pastor, MD 08/26/2020 2:44:45  PM This report has been signed electronically.

## 2020-08-26 NOTE — Op Note (Signed)
[place] Patient Name: [name] Procedure Date: 08/26/2020 1:58 PM MRN: [id number] Endoscopist: Docia Chuck. Henrene Pastor , MD Age: 74 Referring MD:  Date of Birth: 27-Aug-1946 Gender: Female Account #: [id number] Procedure:                Colonoscopy with cold snare polypectomy x 2 Indications:              Screening for colorectal malignant neoplasm.                            Previous exam 2011 was negative for neoplasia Medicines:                Monitored Anesthesia Care Procedure:                Pre-Anesthesia Assessment:                           - Prior to the procedure, a History and Physical                            was performed, and patient medications and                            allergies were reviewed. The patient's tolerance of                            previous anesthesia was also reviewed. The risks                            and benefits of the procedure and the sedation                            options and risks were discussed with the patient.                            All questions were answered, and informed consent                            was obtained. Prior Anticoagulants: The patient has                            taken no previous anticoagulant or antiplatelet                            agents. ASA Grade Assessment: II - A patient with                            mild systemic disease. After reviewing the risks                            and benefits, the patient was deemed in                            satisfactory condition to undergo the procedure.  After obtaining informed consent, the colonoscope                            was passed under direct vision. Throughout the                            procedure, the patient's blood pressure, pulse, and                            oxygen saturations were monitored continuously. The                            Colonoscope was introduced through the anus and                             advanced to the the cecum, identified by                            appendiceal orifice and ileocecal valve. The                            ileocecal valve, appendiceal orifice, and rectum                            were photographed. The quality of the bowel                            preparation was good. The colonoscopy was performed                            without difficulty. The patient tolerated the                            procedure well. The bowel preparation used was                            SUPREP via split dose instruction. Scope In: 2:08:50 PM Scope Out: 2:27:22 PM Scope Withdrawal Time: 0 hours 11 minutes 49 seconds  Total Procedure Duration: 0 hours 18 minutes 32 seconds  Findings:                 Two polyps were found in the descending colon and                            transverse colon. The polyps were 2 to 5 mm in                            size. These polyps were removed with a cold snare.                            Resection and retrieval were complete.                           Many small and large-mouthed diverticula were  found                            in the entire colon.                           The exam was otherwise without abnormality on                            direct and retroflexion views. Complications:            No immediate complications. Estimated blood loss:                            None. Estimated Blood Loss:     Estimated blood loss: none. Impression:               - Two 2 to 5 mm polyps in the descending colon and                            in the transverse colon, removed with a cold snare.                            Resected and retrieved.                           - Diverticulosis in the entire examined colon.                           - The examination was otherwise normal on direct                            and retroflexion views. Recommendation:           - Repeat colonoscopy is not recommended for                             surveillance.                           - Patient has a contact number available for                            emergencies. The signs and symptoms of potential                            delayed complications were discussed with the                            patient. Return to normal activities tomorrow.                            Written discharge instructions were provided to the                            patient.                           -  Resume previous diet.                           - Continue present medications.                           - Await pathology results.                           - EGD today. Please see report Docia Chuck. Henrene Pastor, MD 08/26/2020 2:31:58 PM This report has been signed electronically.

## 2020-08-26 NOTE — Progress Notes (Signed)
PT taken to PACU. Monitors in place. VSS. Report given to RN. 

## 2020-08-27 ENCOUNTER — Other Ambulatory Visit: Payer: Self-pay | Admitting: Adult Health Nurse Practitioner

## 2020-08-27 ENCOUNTER — Encounter: Payer: Self-pay | Admitting: Adult Health Nurse Practitioner

## 2020-08-28 ENCOUNTER — Telehealth: Payer: Self-pay | Admitting: *Deleted

## 2020-08-28 NOTE — Telephone Encounter (Signed)
1. Have you developed a fever since your procedure? no  2.   Have you had an respiratory symptoms (SOB or cough) since your procedure? no  3.   Have you tested positive for COVID 19 since your procedure no  4.   Have you had any family members/close contacts diagnosed with the COVID 19 since your procedure?  no   If yes to any of these questions please route to Joylene John, RN and Joella Prince, RN Follow up Call-  Call back number 08/26/2020  Post procedure Call Back phone  # [phone]  Permission to leave phone message Yes  Some recent data might be hidden     Patient questions:  Do you have a fever, pain , or abdominal swelling? No. Pain Score  0 *  Have you tolerated food without any problems? Yes.    Have you been able to return to your normal activities? Yes.    Do you have any questions about your discharge instructions: Diet   No. Medications  No. Follow up visit  No.  Do you have questions or concerns about your Care? No.  Actions: * If pain score is 4 or above: No action needed, pain <4.

## 2020-09-01 ENCOUNTER — Encounter: Payer: Self-pay | Admitting: Internal Medicine

## 2020-09-01 DIAGNOSIS — H538 Other visual disturbances: Secondary | ICD-10-CM | POA: Diagnosis not present

## 2020-09-01 DIAGNOSIS — H40023 Open angle with borderline findings, high risk, bilateral: Secondary | ICD-10-CM | POA: Diagnosis not present

## 2020-09-01 DIAGNOSIS — E113293 Type 2 diabetes mellitus with mild nonproliferative diabetic retinopathy without macular edema, bilateral: Secondary | ICD-10-CM | POA: Diagnosis not present

## 2020-09-01 LAB — HM DIABETES EYE EXAM

## 2020-09-04 ENCOUNTER — Other Ambulatory Visit: Payer: Self-pay | Admitting: Adult Health

## 2020-09-04 ENCOUNTER — Other Ambulatory Visit: Payer: Self-pay | Admitting: *Deleted

## 2020-09-04 DIAGNOSIS — R911 Solitary pulmonary nodule: Secondary | ICD-10-CM

## 2020-09-04 DIAGNOSIS — F411 Generalized anxiety disorder: Secondary | ICD-10-CM

## 2020-09-22 ENCOUNTER — Other Ambulatory Visit: Payer: Self-pay

## 2020-09-22 ENCOUNTER — Ambulatory Visit (INDEPENDENT_AMBULATORY_CARE_PROVIDER_SITE_OTHER): Payer: Medicare Other | Admitting: Internal Medicine

## 2020-09-22 ENCOUNTER — Encounter: Payer: Self-pay | Admitting: Internal Medicine

## 2020-09-22 VITALS — BP 140/70 | HR 50 | Ht 66.0 in | Wt 189.0 lb

## 2020-09-22 DIAGNOSIS — Z794 Long term (current) use of insulin: Secondary | ICD-10-CM

## 2020-09-22 DIAGNOSIS — E669 Obesity, unspecified: Secondary | ICD-10-CM

## 2020-09-22 DIAGNOSIS — E785 Hyperlipidemia, unspecified: Secondary | ICD-10-CM | POA: Diagnosis not present

## 2020-09-22 DIAGNOSIS — E1122 Type 2 diabetes mellitus with diabetic chronic kidney disease: Secondary | ICD-10-CM | POA: Diagnosis not present

## 2020-09-22 DIAGNOSIS — E1169 Type 2 diabetes mellitus with other specified complication: Secondary | ICD-10-CM

## 2020-09-22 DIAGNOSIS — N184 Chronic kidney disease, stage 4 (severe): Secondary | ICD-10-CM

## 2020-09-22 LAB — POCT GLYCOSYLATED HEMOGLOBIN (HGB A1C): Hemoglobin A1C: 8.7 % — AB (ref 4.0–5.6)

## 2020-09-22 NOTE — Patient Instructions (Addendum)
Please  continue: - Farxiga 10 mg daily before b'fast - Tresiba 40 units daily  Please change: - Novolog 10-14 units 2x day before meals - Novolog 4-5 units for correction of a blood sugars >200   Check sugars 3x a day before meals.  Stop PB crackers in am.  Please return in 3-4 months with your sugar log.

## 2020-09-22 NOTE — Progress Notes (Addendum)
Patient ID: [name], female   DOB: [birth year], 75 y.o.   MRN: [id number]   This visit occurred during the SARS-CoV-2 public health emergency.  Safety protocols were in place, including screening questions prior to the visit, additional usage of staff PPE, and extensive cleaning of exam room while observing appropriate contact time as indicated for disinfecting solutions.   HPI: [name] is a 75 y.o.-year-old female, initially referred by her cardiologist, Dr.Hochrein, presenting for follow-up for DM2, dx in ~2000, insulin-dependent since ~2005, uncontrolled, with complications (PVD - s/p stents and fem-pop bypass; h/o CVA x3; CKD stage 3b; DR; PN). She saw my colleague, Dr. Loanne Drilling many years ago.  Last visit with me 4 months ago. PCP: [name], PA  She was on ABx since last OV >> sugars were higher.  Reviewed HbA1c levels: 05/21/2020: HbA1c calculated from fructosamine 7.3%, much better than the directly measured one, and stable from before Lab Results  Component Value Date   HGBA1C 9.6 (A) 05/21/2020   HGBA1C 9.8 (A) 01/14/2020   HGBA1C 9.1 (A) 07/09/2019   HGBA1C 10.9 (H) 11/13/2018   HGBA1C 9.7 (H) 07/19/2018   HGBA1C 8.7 (H) 04/18/2018   HGBA1C 8.3 (H) 01/03/2018   HGBA1C 10.0 (H) 09/27/2017   HGBA1C 10.5 (H) 05/04/2017   HGBA1C 8.9 02/25/2017  01/14/2020: HbA1c calculated from fructosamine 7.3% 10/08/2019: HbA1c calculated from fructosamine: 6.9%  Her antipancreatic antibodies were negative: Component     Latest Ref Rng & Units 07/22/2016  Glutamic Acid Decarb Ab     <5 IU/mL <5   She was previously on: - Novolog 70/30 35 units before breakfast and 30 units before dinner- injecting hips and arms due to abdominal pain - Farxiga 5 mg in am - started 06/2019 >> 10 mg daily - increased 09/2019  Currently on: - Tresiba 34 >> 40 units daily - Novolog 6-8 >> 12 units 2x day before b'fast and dinner (+/- lunch) >>  Novolog 14 units 2x day before snack in am and dinner  (may need this before lunch, also) >> 14 units before meals 2-3x a day - Farxiga 10 mg daily before b'fast She could not tolerate Ozempic (started 11/2018) due to nausea, vomiting, abdominal pain-stopped 04/2019. She was on Invokana >> intolerance 2/2 weight loss and dehydration >> hospitalization. She was on Metformin and sulfonylurea at diagnosis. She was also on Januvia at the same time with Invokana >> stopped along with Invokana   Pt checks her sugars 2x a day: - am:  86-138 >> 88, 102-210, 253, 283 >> 112-218 - 2h after b'fast: 326 >> n/c >> 326 (10/2019) >> n/c - before lunch: 265 >> 124-172 >> n/c - 2h after lunch: 280, 286 >> n/c - before dinner: 166-180s >> 68-157 >> 73-193, 215 >> n/c - 2h after dinner: 247 >> (late dinner) 196- 201 >> 172 >> n/c - bedtime: n/c - nighttime: n/c Lowest sugar was 68 >> 73 >> 76 >> 64 x1, 84; she has hypoglycemia awareness at 80. Highest sugar was 201 >> 283 (steroid) >> 335 (no medications) >> 218.  Glucometer: [place]  Pt's meals are: - Breakfast: eggs or cheese sandwich - Lunch: half a sandwich, apple - Dinner 9-9:30 pm: chicken, veggies - Snacks: stopped cheese; now apples and pickles She works part-time, from 10 am - 2 pm Tue-Fri.   -+ CKD stage IIIb, last BUN/creatinine:  Lab Results  Component Value Date   BUN 35 (H) 08/04/2020   BUN 35 (H) 07/31/2020  CREATININE 2.20 (H) 08/04/2020   CREATININE 2.38 (H) 07/31/2020   Lab Results  Component Value Date   GFRAA 25 (L) 08/04/2020   GFRAA 23 (L) 07/31/2020   GFRAA 22 (L) 07/30/2020   GFRAA 31 (L) 10/18/2019   GFRAA 31 (L) 10/08/2019  On olmesartan 40.  -+ HL; last set of lipids: Lab Results  Component Value Date   CHOL 118 08/04/2020   HDL 40 (L) 08/04/2020   LDLCALC 55 08/04/2020   LDLDIRECT 146.4 01/30/2010   TRIG 152 (H) 08/04/2020   CHOLHDL 3.0 08/04/2020  On Crestor 40, omega-3 fatty acids 1200 mg daily.  - last eye exam was: [facility] Diabetic Eye Exam 09/01/2020  Retinopathy* No Retinopathy  She also has glaucoma.  -+ Numbness and tingling in her feet.  She has a callus on the left foot.  She sees a podiatrist.  She did not tolerate Lyrica.  On ASA 81.  Pt has FH of DM in sister.  She also has a history of nontoxic multinodular goiter.    Latest TSH was reviewed and this was normal: Lab Results  Component Value Date   TSH 1.58 08/04/2020   She also has a history of HTN, brain aneurysm, emphysema, esophageal stricture, GERD.  She had hernia repair surgery- had mesh placed >> persistent abdominal pain.  Her husband's nephew, Modena Slater, was also my patient and he died at the beginning of 12-02-2019 after he stopped going to dialysis.  ROS: Constitutional: no weight gain/no weight loss, no fatigue, no subjective hyperthermia, no subjective hypothermia Eyes: no blurry vision, no xerophthalmia ENT: no sore throat, no nodules palpated in neck, + dysphagia, no odynophagia, no hoarseness Cardiovascular: no CP/no SOB/no palpitations/no leg swelling Respiratory: no cough/no SOB/no wheezing Gastrointestinal: no N/no V/no D/no C/no acid reflux Musculoskeletal: no muscle aches/no joint aches Skin: no rashes, no hair loss Neurological: no tremors/no numbness/no tingling/no dizziness, + HA  I reviewed pt's medications, allergies, PMH, social hx, family hx, and changes were documented in the history of present illness. Otherwise, unchanged from my initial visit note.  Past Medical History:  Diagnosis Date  . Abnormal findings on esophagogastroduodenoscopy (EGD) 07/2010  . Aneurysm [facility]) 12/02/2011   Right Brain   . Aneurysm ([place])    in brain x 2  . Anxiety   . Arthritis   . Cataract   . Chronic kidney disease   . Colon polyps   . Depression   . Diabetes mellitus 1998  . Diverticulosis   . Emphysema of lung ([place])   . ESOPHAGEAL STRICTURE 08/27/2008  . GERD (gastroesophageal reflux disease)   . Hemorrhoids   . Hiatal hernia   . Hyperlipidemia   .  Hypertension 12/01/98  . Migraines   . Peripheral vascular disease ([place])   . Phlebitis    30 years ago  left leg  . Stroke [facility])    multiple mini strokes ( brain aneurysm )   Past Surgical History:  Procedure Laterality Date  . ABDOMINAL AORTAGRAM N/A 01/04/2012   Procedure: ABDOMINAL Maxcine Ham;  Surgeon: Serafina Mitchell, MD;  Location: [facility] CATH LAB;  Service: Cardiovascular;  Laterality: N/A;  . ABDOMINAL AORTAGRAM N/A 08/15/2012   Procedure: ABDOMINAL Maxcine Ham;  Surgeon: Serafina Mitchell, MD;  Location: [facility] CATH LAB;  Service: Cardiovascular;  Laterality: N/A;  . ABDOMINAL HYSTERECTOMY  12/01/82  . cataract surgery Right 10-22-2015  . cataract surgery Left 11-05-2015  . CESAREAN SECTION    . CHOLECYSTECTOMY    . COLONOSCOPY  07/2010  . DENTAL SURGERY  Aug. 16, 2013   left lower   . ESOPHAGOGASTRODUODENOSCOPY  07/2010  . EYE SURGERY  Nov. 2014   Laser-Glaucoma  . FEMORAL ARTERY STENT  05/11/11   Left superficial femoral and popliteal artery  . HERNIA REPAIR     times two  . KNEE SURGERY    . LOWER EXTREMITY ANGIOGRAM Left 08/15/2012   Procedure: LOWER EXTREMITY ANGIOGRAM;  Surgeon: Serafina Mitchell, MD;  Location: [facility] CATH LAB;  Service: Cardiovascular;  Laterality: Left;  lt leg angio  . rotator cuff surgery    . THYROID SURGERY     Social History   Socioeconomic History  . Marital status: Married    Spouse name: Not on file  . Number of children: 2  . Years of education: Not on file  . Highest education level: Not on file  Occupational History  . 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 Smoking status: Heavy Tobacco Smoker    Packs/day: 0.50    Years: 40.00    Pack years: 20.00    Types: Cigarettes  . Smokeless tobacco: Never Used  . Tobacco comment: in process of quiting  Vaping Use  . Vaping Use: Never used  Substance and Sexual Activity  . Alcohol use: No    Alcohol/week: 0.0 standard drinks  . Drug use: No  . Sexual activity: Not on file   Other Topics Concern  . Not on file  Social History Narrative   Lives with husband.        Social Determinants of Health   Financial Resource Strain: Not on file  Food Insecurity: Not on file  Transportation Needs: Not on file  Physical Activity: Not on file  Stress: Not on file  Social Connections: Not on file  Intimate Partner Violence: Not on file   Current Outpatient Medications on File Prior to Visit  Medication Sig Dispense Refill  . albuterol (VENTOLIN HFA) 108 (90 Base) MCG/ACT inhaler Inhale 2 puffs into the lungs every 4 (four) hours as needed for wheezing or shortness of breath. 18 g 11  . amLODipine (NORVASC) 10 MG tablet TAKE 1 TABLET DAILY FOR BLOOD PRESSURE 90 tablet 3  . aspirin EC 81 MG tablet Take 81 mg by mouth at bedtime.     . bisoprolol-hydrochlorothiazide (ZIAC) 10-6.25 MG tablet TAKE 1 TABLET BY MOUTH EVERY DAY 90 tablet 3  . Blood Glucose Monitoring Suppl (ACCU-CHEK AVIVA PLUS) W/DEVICE KIT Check blood sugar up to three times daily 1 kit 0  . Cholecalciferol (VITAMIN D PO) Take 5,000 Units by mouth daily.    Marland Kitchen CINNAMON PO Take 1,000 mg by mouth 2 (two) times daily.     . citalopram (CELEXA) 40 MG tablet TAKE 1 TABLET DAILY FOR MOOD & ANXIETY 90 tablet 3  . clonazePAM (KLONOPIN) 0.5 MG tablet Take 1/2 - 1 tablet at Bedtime ONLY if needed forSleep &  limit to 5 days /week to avoid Addiction & Dementia. Script should last longer than 30 days. 30 tablet 0  . dapagliflozin propanediol (FARXIGA) 10 MG TABS tablet Take 10 mg by mouth daily before breakfast. 90 tablet 3  . glucose blood (ACCU-CHEK AVIVA PLUS) test strip Use daily to check BS TID Dx. E11.22 300 each 1  . insulin aspart (NOVOLOG FLEXPEN) 100 UNIT/ML FlexPen Inject 6-8 Units into the skin 3 (three) times daily with meals. (Patient taking differently: Inject 14 Units into the skin 3 (three) times daily  with meals. ) 15 mL 11  . insulin degludec (TRESIBA FLEXTOUCH) 200 UNIT/ML FlexTouch Pen Inject 34 units  daily under skin (Patient taking differently: Inject 40 Units into the skin daily. Inject 34 units daily under skin) 3 pen 5  . Insulin Pen Needle 29G X 5MM MISC Use with insulin pen 3 times a day. 300 each 11  . Magnesium 500 MG TABS Take 500 mg by mouth daily.    Marland Kitchen olmesartan (BENICAR) 40 MG tablet TAKE 1 TABLET DAILY FOR BLOOD PRESSURE 90 tablet 3  . Omega-3 Fatty Acids (FISH OIL) 1200 MG CAPS Take 1,200 capsules by mouth daily.     Marland Kitchen omeprazole (PRILOSEC) 40 MG capsule Take 1 capsule (40 mg total) by mouth in the morning and at bedtime. 180 capsule 3  . OVER THE COUNTER MEDICATION Take 1 tablet by mouth daily. One a day vitamin Woman's one a day    . rosuvastatin (CRESTOR) 40 MG tablet TAKE 1 /2 TO 1 TABLET DAILY FOR CHOLESTEROL 90 tablet 1  . sulfamethoxazole-trimethoprim (BACTRIM DS) 800-160 MG tablet Take 1 tablet by mouth 2 (two) times daily. 14 tablet 0  . Tiotropium Bromide Monohydrate (SPIRIVA RESPIMAT) 2.5 MCG/ACT AERS Inhale 1 puff into the lungs daily. 4 g 11  . vitamin E 400 UNIT capsule Take 400 Units by mouth daily.     No current facility-administered medications on file prior to visit.   Allergies  Allergen Reactions  . Ciprofloxacin Other (See Comments)  . Ozempic (0.25 Or 0.5 Mg-Dose) [Semaglutide(0.25 Or 0.75m-Dos)] Nausea And Vomiting  . Plavix [Clopidogrel Bisulfate] Palpitations  . Amoxicillin Itching and Swelling    FACE & EYES SWELL  . Ace Inhibitors   . Ciprofloxacin Hcl Swelling  . Lyrica [Pregabalin]     Itch, gain weight  . Penicillins Other (See Comments)    REACTION: unspecified   Family History  Problem Relation Age of Onset  . CAD Mother 610      Died of MI  . Hypertension Mother   . Heart attack Mother   . Heart disease Mother   . CAD Father 673      Died of MI  . Heart disease Father   . Heart attack Father   . CAD Brother 687      Two brothers died of MI  . Heart attack Brother   . Heart disease Brother        Amputation  . Diabetes  Sister   . Hypertension Sister   . Heart attack Brother   . Heart disease Brother   . Stroke Sister   . Colon cancer Neg Hx   . Stomach cancer Neg Hx   . Esophageal cancer Neg Hx     PE: BP 140/70   Pulse (!) 50   Ht 5' 6"  (1.676 m)   Wt 189 lb (85.7 kg)   SpO2 97%   BMI 30.51 kg/m  Wt Readings from Last 3 Encounters:  09/22/20 189 lb (85.7 kg)  08/26/20 171 lb (77.6 kg)  08/04/20 171 lb (77.6 kg)   Constitutional: overweight, in NAD Eyes: PERRLA, EOMI, no exophthalmos ENT: moist mucous membranes, no thyromegaly, no cervical lymphadenopathy Cardiovascular: RRR, No MRG Respiratory: CTA B Gastrointestinal: abdomen soft, NT, ND, BS+ Musculoskeletal: no deformities, strength intact in all 4 Skin: moist, warm, no rashes Neurological: no tremor with outstretched hands, DTR normal in all 4  ASSESSMENT: 1. DM2, insulin-dependent, uncontrolled, with complications - PVD - s/p stents and  fem-pop bypass - h/o CVA - CKD stage 3b - DR - PN  2.  Hyperlipidemia  3.  Obesity class 1  PLAN:  1. Patient with longstanding, uncontrolled, type 2 diabetes, previously on a premixed insulin regimen and SGLT2 inhibitor, currently on a basal-bolus insulin regimen and SGLT2 inhibitor.  We did not use Invokana due to severe PVD and also previous intolerance with severe dehydration, but she tolerates [place] well.  She could not tolerate a GLP-1 receptor agonist Ozempic) due to GI symptoms.  She did not want to try Trulicity.  At last visit, she also refused a VGo patch pump. -At last visit, she was late for the appointment that forgot her meter.  She could not remember her blood sugars and home.  She feels that they have improved but could not give me any information about them.  She was using a higher dose of [place] and NovoLog but I advised her to vary the dose of NovoLog based on the size of the meals.  We could not change the regimen otherwise due to lack CBG LADA.  HbA1c at that time was  9.6%, however, HbA1c calculated from fructosamine was better, at 7.3%. -At today's visit, we reviewed her blood sugars at home.  She only checks in the morning, and not every day.  We discussed about the importance of checking blood sugars later in the day, also, especially before meals. -At this visit, sugars are still high in the morning, occasionally above 200.  Upon questioning, she is taking the [place] daily, and correctly, however, she takes 14 units of NovoLog at home approximately 1 hour before the snack in the morning (when she gets to work-labs), and then eats breakfast approximately an hour later without taking insulin.  She takes insulin only when she gets home, for late lunch, between 3 and 4 PM.  She may not eat dinner and does not take the insulin then.  I suggested not to take the insulin and then drive to work but wait until he gets to work and then take [employer].  15 minutes later, she can eat breakfast (not the snack -in fact, I advised him to eliminate the peanut butter crackers).  If she gets hungry later on, she can eat a low carb snack like nuts, veggies or lower glycemic index foods to last her until she gets home.  I did recommend to check blood sugars before every meal even if she does not plan to eat that particular meal, so she can correct possible high blood sugars.  I advised her to correct these with lower insulin doses compared to mealtime insulin. - I suggested to:  Patient Instructions  Please  continue: - Farxiga 10 mg daily before b'fast - Tresiba 40 units daily  Please change: - Novolog 10-14 units 2x day before meals - Novolog 4-5 units for correction of a blood sugars >200   Check sugars 3x a day before meals.  Stop PB crackers in am.  Please return in 3-4 months with your sugar log.    - we checked her HbA1c: 8.7% (improved) but we will also check a fructosamine level - advised to check sugars at different times of the day - 3-4x a day, rotating check  times - advised for yearly eye exams >> she is UTD - return to clinic in 3-4 months  2. HL -Reviewed latest lipid panel from 07/2020: LDL at goal, HDL slightly low: Lab Results  Component Value Date   CHOL 118  08/04/2020   HDL 40 (L) 08/04/2020   LDLCALC 55 08/04/2020   LDLDIRECT 146.4 01/30/2010   TRIG 152 (H) 08/04/2020   CHOLHDL 3.0 08/04/2020  -Continues Crestor 40 and omega-3 fatty acids, without side effects  3.  Obesity class 1 -She continues on SGLT2 inhibitor, which should also help with weight loss, but unfortunately we could not use a GLP-1 receptor agonist due to intolerance -At last visit, weight was stable -At this visit, she also has a net stable weight, although weights checked in the last 2 months on other scales in the system were lower  Component     Latest Ref Rng & Units 09/22/2020  Hemoglobin A1C     4.0 - 5.6 % 8.7 (A)  Fructosamine     205 - 285 umol/L 273  HbA1c calculated from fructosamine is much better than the directly measured HbA1c, at 6.25%!  Philemon Kingdom, MD PhD [facility] Endocrinology

## 2020-09-25 LAB — FRUCTOSAMINE: Fructosamine: 273 umol/L (ref 205–285)

## 2020-09-29 ENCOUNTER — Telehealth: Payer: Self-pay

## 2020-09-29 NOTE — Telephone Encounter (Signed)
Pt called with c/o bilateral LE sharp pains that come and go. She describes them as a burning and sharp pain that shoot through the lower leg. She wears compression stockings occasionally. Her LE swells by the end of the day. She feels this has been getting worse over the last month. We have scheduled her MD f/u for next week. Pt verbalized understanding; no further questions/concerns at this time.

## 2020-10-01 ENCOUNTER — Encounter: Payer: Self-pay | Admitting: *Deleted

## 2020-10-03 ENCOUNTER — Other Ambulatory Visit: Payer: Self-pay

## 2020-10-03 ENCOUNTER — Ambulatory Visit (HOSPITAL_COMMUNITY)
Admission: RE | Admit: 2020-10-03 | Discharge: 2020-10-03 | Disposition: A | Discharge status: Home / Self Care | Payer: Medicare Other | Source: Ambulatory Visit | Attending: Surgery | Admitting: Surgery

## 2020-10-03 ENCOUNTER — Ambulatory Visit (INDEPENDENT_AMBULATORY_CARE_PROVIDER_SITE_OTHER)
Admission: RE | Admit: 2020-10-03 | Discharge: 2020-10-03 | Disposition: A | Discharge status: Home / Self Care | Payer: Medicare Other | Source: Ambulatory Visit | Attending: Surgery | Admitting: Surgery

## 2020-10-03 DIAGNOSIS — I6523 Occlusion and stenosis of bilateral carotid arteries: Secondary | ICD-10-CM

## 2020-10-03 DIAGNOSIS — Z959 Presence of cardiac and vascular implant and graft, unspecified: Secondary | ICD-10-CM | POA: Diagnosis not present

## 2020-10-03 DIAGNOSIS — I779 Disorder of arteries and arterioles, unspecified: Secondary | ICD-10-CM | POA: Insufficient documentation

## 2020-10-04 ENCOUNTER — Other Ambulatory Visit: Payer: Self-pay | Admitting: Internal Medicine

## 2020-10-04 ENCOUNTER — Other Ambulatory Visit: Payer: Self-pay | Admitting: Adult Health

## 2020-10-04 DIAGNOSIS — F411 Generalized anxiety disorder: Secondary | ICD-10-CM

## 2020-10-06 ENCOUNTER — Ambulatory Visit: Payer: Medicare Other | Admitting: Surgery

## 2020-10-13 DIAGNOSIS — R809 Proteinuria, unspecified: Secondary | ICD-10-CM | POA: Diagnosis not present

## 2020-10-13 DIAGNOSIS — R42 Dizziness and giddiness: Secondary | ICD-10-CM | POA: Diagnosis not present

## 2020-10-13 DIAGNOSIS — N2581 Secondary hyperparathyroidism of renal origin: Secondary | ICD-10-CM | POA: Diagnosis not present

## 2020-10-13 DIAGNOSIS — I779 Disorder of arteries and arterioles, unspecified: Secondary | ICD-10-CM | POA: Diagnosis not present

## 2020-10-13 DIAGNOSIS — I129 Hypertensive chronic kidney disease with stage 1 through stage 4 chronic kidney disease, or unspecified chronic kidney disease: Secondary | ICD-10-CM | POA: Diagnosis not present

## 2020-10-13 DIAGNOSIS — R609 Edema, unspecified: Secondary | ICD-10-CM | POA: Diagnosis not present

## 2020-10-13 DIAGNOSIS — N184 Chronic kidney disease, stage 4 (severe): Secondary | ICD-10-CM | POA: Diagnosis not present

## 2020-10-13 NOTE — Progress Notes (Signed)
[address]       [place] [address]             [phone]                    Kerby L Orantes [place] Medical Record #[id number] Date of Birth: Dec 19, 1945  Referring: Delfina Redwood Primary Care: [name], MD Primary Cardiologist: Minus Breeding, MD  Chief Complaint:    Chief Complaint  Patient presents with  . Lung Lesion    4 month f/u with Chest CT-super D    History of Present Illness:    [name] 75 y.o. female is seen in the office  today for follow-up CT scan for pulmonary nodules.  The patient is a long-term smoker,  smoking approximately a third of a pack a day.  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   She returns today because of a persistent groundglass opacity in the apex of the right lung that has been present for at least 2.5  years-he comes in today with a follow-up CT of the chest.   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 His laboratory findings are not available in epic  Patient continues to smoke but 4 to 5 cigarettes a day.  Pulmonary function studies done in 2020 showed FEV1 1.2 50% and a diffusion capacity of 15.374%  She also notes that she is in increasing difficulties with her circulation in her legs with increasing claudication,   Current Activity/ Functional Status:  Patient is independent with mobility/ambulation, transfers, ADL's, IADL's.   Zubrod Score: At the time of surgery this patient's most appropriate activity status/level should be described as: $RemoveBefor'[]'nUnctcObvpUp$     0    Normal activity, no symptoms $RemoveBef'[x]'hGGusIUAdA$     1    Restricted in physical strenuous activity but ambulatory, able to do out light work $RemoveBe'[]'DAKyITzGE$     2    Ambulatory and capable of self care, unable to do work activities, up and about               >50 % of waking hours                               '[]'$     3    Only limited self care, in bed greater than 50% of waking hours $RemoveBefo'[]'ECzkYCKleDN$     4    Completely disabled, no self care, confined to bed or chair $Remove'[]'JHmBWbK$     5    Moribund   Past Medical History:  Diagnosis Date  . Abnormal findings on esophagogastroduodenoscopy (EGD) 07/2010  . Aneurysm [facility]) 2013   Right Brain   . Aneurysm ([place])    in brain x 2  . Anxiety   . Arthritis   . Cataract   . Chronic kidney disease   . Colon polyps   . Depression   . Diabetes mellitus 1998  . Diverticulosis   . Emphysema of lung ([place])   . ESOPHAGEAL STRICTURE 08/27/2008  . GERD (gastroesophageal reflux disease)   . Hemorrhoids   . Hiatal hernia   . Hyperlipidemia   . Hypertension 2000  . Migraines   . Peripheral vascular disease ([place])   . Phlebitis  30 years ago  left leg  . Stroke [facility])    multiple mini strokes ( brain aneurysm )    Past Surgical History:  Procedure Laterality Date  . ABDOMINAL AORTAGRAM N/A 01/04/2012   Procedure: ABDOMINAL Maxcine Ham;  Surgeon: Serafina Mitchell, MD;  Location[facility] C[facility] LAB;  Service: Cardiovascular;  Laterality: N/A;  . ABDOMINAL AORTAGRAM N/A 08/15/2012   Procedure: ABDOMINAL Maxcine Ham;  Surgeon: Serafina Mitchell, MD;  Location: [facility] CATH LAB;  Service: Cardiovascular;  Laterality: N/A;  . ABDOMINAL HYSTERECTOMY  1984  . cataract surgery Right 10-22-2015  . cataract surgery Left 11-05-2015  . CESAREAN SECTION    . CHOLECYSTECTOMY    . COLONOSCOPY  07/2010  . DENTAL SURGERY  Aug. 16, 2013   left lower   . ESOPHAGOGASTRODUODENOSCOPY  07/2010  . EYE SURGERY  Nov. 2014   Laser-Glaucoma  . FEMORAL ARTERY STENT  05/11/11   Left superficial femoral and popliteal artery  . HERNIA REPAIR     times two  . KNEE SURGERY    . LOWER EXTREMITY ANGIOGRAM Left 08/15/2012   Procedure: LOWER EXTREMITY ANGIOGRAM;  Surgeon: Serafina Mitchell, MD;  Location: [facility] CATH LAB;  Service: Cardiovascular;  Laterality: Left;  lt leg angio  . rotator cuff  surgery    . THYROID SURGERY      Family History  Problem Relation Age of Onset  . CAD Mother 4       Died of MI  . Hypertension Mother   . Heart attack Mother   . Heart disease Mother   . CAD Father 59       Died of MI  . Heart disease Father   . Heart attack Father   . CAD Brother 44       Two brothers died of MI  . Heart attack Brother   . Heart disease Brother        Amputation  . Diabetes Sister   . Hypertension Sister   . Heart attack Brother   . Heart disease Brother   . Stroke Sister   . Colon cancer Neg Hx   . Stomach cancer Neg Hx   . Esophageal cancer Neg Hx      Social History   Tobacco Use  Smoking Status Heavy Tobacco Smoker  . Packs/day: 0.50  . Years: 40.00  . Pack years: 20.00  . Types: Cigarettes  Smokeless Tobacco Never Used  Tobacco Comment   in process of quiting    Social History   Substance and Sexual Activity  Alcohol Use No  . Alcohol/week: 0.0 standard drinks     Allergies  Allergen Reactions  . Ciprofloxacin Other (See Comments)  . Ozempic (0.25 Or 0.5 Mg-Dose) [Semaglutide(0.25 Or 0.5mg -Dos)] Nausea And Vomiting  . Plavix [Clopidogrel Bisulfate] Palpitations  . Amoxicillin Itching and Swelling    FACE & EYES SWELL  . Ace Inhibitors   . Ciprofloxacin Hcl Swelling  . Lyrica [Pregabalin]     Itch, gain weight  . Penicillins Other (See Comments)    REACTION: unspecified    Current Outpatient Medications  Medication Sig Dispense Refill  . albuterol (VENTOLIN HFA) 108 (90 Base) MCG/ACT inhaler Inhale 2 puffs into the lungs every 4 (four) hours as needed for wheezing or shortness of breath. 18 g 11  . amLODipine (NORVASC) 10 MG tablet TAKE 1 TABLET DAILY FOR BLOOD PRESSURE 90 tablet 3  . aspirin EC 81 MG tablet Take 81 mg by mouth at bedtime.     Marland Kitchen  bisoprolol-hydrochlorothiazide (ZIAC) 10-6.25 MG tablet TAKE 1 TABLET BY MOUTH EVERY DAY 90 tablet 3  . Blood Glucose Monitoring Suppl (ACCU-CHEK AVIVA PLUS) W/DEVICE KIT Check  blood sugar up to three times daily 1 kit 0  . Cholecalciferol (VITAMIN D PO) Take 5,000 Units by mouth daily.    Marland Kitchen CINNAMON PO Take 1,000 mg by mouth 2 (two) times daily.     . citalopram (CELEXA) 40 MG tablet TAKE 1 TABLET DAILY FOR MOOD & ANXIETY 90 tablet 3  . clonazePAM (KLONOPIN) 0.5 MG tablet TAKE 1/2 - 1 TABLET AT BEDTIME ONLY IF NEEDED FORSLEEP & LIMIT TO 5 DAYS /WEEK TO AVOID ADDICTION & DEMENTIA. SCRIPT SHOULD LAST LONGER THAN 30 DAYS. 30 tablet 0  . dapagliflozin propanediol (FARXIGA) 10 MG TABS tablet Take 10 mg by mouth daily before breakfast. 90 tablet 3  . glucose blood (ACCU-CHEK AVIVA PLUS) test strip Use daily to check BS TID Dx. E11.22 300 each 1  . insulin aspart (NOVOLOG FLEXPEN) 100 UNIT/ML FlexPen Inject 6-8 Units into the skin 3 (three) times daily with meals. (Patient taking differently: Inject 14 Units into the skin 3 (three) times daily with meals.) 15 mL 11  . Insulin Pen Needle 29G X 5MM MISC Use with insulin pen 3 times a day. 300 each 11  . Magnesium 500 MG TABS Take 500 mg by mouth daily.    Marland Kitchen olmesartan (BENICAR) 40 MG tablet TAKE 1 TABLET DAILY FOR BLOOD PRESSURE 90 tablet 3  . Omega-3 Fatty Acids (FISH OIL) 1200 MG CAPS Take 1,200 capsules by mouth daily.     Marland Kitchen omeprazole (PRILOSEC) 40 MG capsule Take 1 capsule (40 mg total) by mouth in the morning and at bedtime. 180 capsule 3  . OVER THE COUNTER MEDICATION Take 1 tablet by mouth daily. One a day vitamin Woman's one a day    . rosuvastatin (CRESTOR) 40 MG tablet TAKE 1 /2 TO 1 TABLET DAILY FOR CHOLESTEROL 90 tablet 1  . sulfamethoxazole-trimethoprim (BACTRIM DS) 800-160 MG tablet Take 1 tablet by mouth 2 (two) times daily. 14 tablet 0  . Tiotropium Bromide Monohydrate (SPIRIVA RESPIMAT) 2.5 MCG/ACT AERS Inhale 1 puff into the lungs daily. 4 g 11  . TRESIBA FLEXTOUCH 200 UNIT/ML FlexTouch Pen INJECT 34 UNITS DAILY UNDER SKIN 9 mL 2  . vitamin E 400 UNIT capsule Take 400 Units by mouth daily.     No current  facility-administered medications for this visit.    Pertinent items are noted in HPI.   Review of Systems:     Cardiac Review of Systems: [Y] = yes  or   [ N ] = no   Chest Pain [  ]  Resting SOB [  ] Exertional SOB  [  ]  Orthopnea $RemoveBe'[ ]'inbLHGVIl$    Pedal Edema $RemoveBef'[ ]'GiQpankWrD$     Palpitations $RemoveBefor'[ ]'CHxWqZxEiZwh$  Syncope  $Remove'[ ]'NNMAJgG$    Presyncope $RemoveBef'[]'NzRNXMnoiz$    General Review of Systems: [Y] = yes [  ]=no Constitional: recent weight change [  ];  Wt loss over the last 3 months [   ] anorexia [  ]; fatigue [  ]; nausea [  ]; night sweats [  ]; fever [  ]; or chills [  ];           Eye : blurred vision [  ]; diplopia [   ]; vision changes [  ];  Amaurosis fugax[  ]; Resp: cough [  ];  wheezing[  ];  hemoptysis[  ];  shortness of breath[  ]; paroxysmal nocturnal dyspnea[  ]; dyspnea on exertion[  ]; or orthopnea[  ];  GI:  gallstones[  ], vomiting[  ];  dysphagia[  ]; melena[  ];  hematochezia [  ]; heartburn[  ];   Hx of  Colonoscopy[  ]; GU: kidney stones [  ]; hematuria[  ];   dysuria [  ];  nocturia[  ];  history of     obstruction [  ]; urinary frequency [  ]             Skin: rash, swelling[  ];, hair loss[  ];  peripheral edema[  ];  or itching[  ]; Musculosketetal: myalgias[  ];  joint swelling[  ];  joint erythema[  ];  joint pain[  ];  back pain[  ];  Heme/Lymph: bruising[  ];  bleeding[  ];  anemia[  ];  Neuro: TIA[  ];  headaches[  ];  stroke[y  ];  vertigo[  ];  seizures[  ];   paresthesias[  ];  difficulty walking[  ];  Psych:depression[  ]; anxiety[  ];  Endocrine: diabetes[  ];  thyroid dysfunction[  ];  Immunizations: Flu up to date [  ]; Pneumococcal up to date [  ];  Other:     PHYSICAL EXAMINATION: BP (!) 200/65   Pulse 64   Temp 97.6 F (36.4 C) (Skin)   Resp 20   Ht 5' 6"  (1.676 m)   Wt 188 lb (85.3 kg)   SpO2 98% Comment: RA  BMI 30.34 kg/m  General appearance: alert and appears stated age Neck: no adenopathy, no carotid bruit, no JVD, supple, symmetrical, trachea midline and thyroid not enlarged, symmetric, no  tenderness/mass/nodules Resp: clear to auscultation bilaterally Cardio: regular rate and rhythm, S1, S2 normal, no murmur, click, rub or gallop Extremities: extremities normal, atraumatic, no cyanosis or edema Neurologic: Grossly normal  Diagnostic Studies & Laboratory data:     Recent Radiology Findings:  CT Super D Chest Wo Contrast  Result Date: 10/16/2020 CLINICAL DATA:  FOLLOW-UP LUNG NODULE EXAM: CT CHEST WITHOUT CONTRAST TECHNIQUE: Multidetector CT imaging of the chest was performed using thin slice collimation for electromagnetic bronchoscopy planning purposes, without intravenous contrast. COMPARISON:  06/19/20 FINDINGS: Cardiovascular: Mild cardiac enlargement. Aortic atherosclerosis. Coronary artery atherosclerotic calcifications. No pericardial effusion. Mediastinum/Nodes: No enlarged mediastinal, hilar, or axillary lymph nodes. Thyroid gland, trachea, and esophagus demonstrate no significant findings. Lungs/Pleura: Paraseptal and centrilobular emphysema. No pleural effusion, airspace consolidation or atelectasis. Calcified granuloma identified in the superior segment of right lower lobe. Within the medial right apex there is a pure ground-glass attenuating nodule which measures 1.6 x 1.3 cm, image 30/8. Unchanged from previous exam. No new or enlarging nodules. Upper Abdomen: No acute abnormality.  Aortic atherosclerosis. Musculoskeletal: No chest wall mass or suspicious bone lesions identified. IMPRESSION: 1. Stable appearance of pure ground-glass attenuating nodule within the medial right apex. Adenocarcinoma cannot be excluded. Follow up by CT is recommended in 12 months, with continued annual surveillance for a minimum of 3 years.These recommendations are taken from: Recommendations for the Management of Subsolid Pulmonary Nodules Detected at CT: A Statement from the [place] [facility] 2013; 266:1, 076-226. 2. Aortic Atherosclerosis (ICD10-I70.0) and Emphysema (ICD10-J43.9). 3.  Coronary artery calcifications. Electronically Signed   By: Kerby Moors M.D.   On: 10/16/2020 14:22     I have independently reviewed the above  cath films and reviewed the findings with the  patient .  xray  CT Super D Chest Wo Contrast  Result Date: 06/19/2020 CLINICAL DATA:  Follow-up ground-glass opacities. EXAM: CT CHEST WITHOUT CONTRAST TECHNIQUE: Multidetector CT imaging of the chest was performed using thin slice collimation for electromagnetic bronchoscopy planning purposes, without intravenous contrast. COMPARISON:  November 19, 2019 FINDINGS: Cardiovascular: Calcified atheromatous plaque in the thoracic aorta. Three-vessel coronary artery disease. Both these findings are unchanged compared to the prior study. No aneurysmal dilation. Central pulmonary vasculature unremarkable on noncontrast imaging. No pericardial effusion. Limited assessment of cardiovascular structures given lack of intravenous contrast. Mediastinum/Nodes: Thoracic inlet structures are normal. No axillary lymphadenopathy. No mediastinal adenopathy. No hilar adenopathy. Lungs/Pleura: RIGHT upper lobe ground-glass opacity measuring 1.7 x 1.2 cm within 1 mm of previous measurement (image 28, series 8) Millimetric nodules in the RIGHT upper lobe seen on the prior study are unchanged. Densely calcified granuloma in the RIGHT lower lobe as before. RIGHT middle lobe bronchial dilation is stable. Centrilobular pulmonary emphysema as before.  Airways are patent. Upper Abdomen: Incidental imaging of upper abdominal contents without acute process. Thickening of bilateral adrenals likely adrenal hyperplasia similar to previous imaging, adrenals are incompletely imaged. Musculoskeletal: No acute musculoskeletal process. Spinal degenerative changes. IMPRESSION: 1. RIGHT upper lobe ground-glass opacity measuring 1.7 x 1.2 cm within 1 mm of previous measurement. Findings shows slow enlargement over time with definite difference in size  compared to the study of May 16, 2015 where it measured approximately 1.3 x 1.2 cm. Slow enlargement and persistence could be seen in the setting of bronchogenic neoplasm, but there is no current solid component or significant change since studies dating back to March of 2020 2. Millimetric nodules in the RIGHT upper lobe seen on the prior study are unchanged. 3. Three-vessel coronary artery disease. 4. Emphysema and aortic atherosclerosis. Aortic Atherosclerosis (ICD10-I70.0) and Emphysema (ICD10-J43.9). Electronically Signed   By: Zetta Bills M.D.   On: 06/19/2020 09:32    CT Super D Chest Wo Contrast  Result Date: 11/19/2019 CLINICAL DATA:  Follow-up pulmonary lesions. EXAM: CT CHEST WITHOUT CONTRAST TECHNIQUE: Multidetector CT imaging of the chest was performed using thin slice collimation for electromagnetic bronchoscopy planning purposes, without intravenous contrast. COMPARISON:  04/19/2019 FINDINGS: Cardiovascular: The heart is normal in size. No pericardial effusion. Stable tortuosity, mild ectasia and advanced calcifications involving the thoracic aorta. Stable advanced branch vessel calcifications including three-vessel coronary artery calcifications. Mediastinum/Nodes: Stable scattered mediastinal and hilar lymph nodes all measuring less than 8 mm. Stable calcified right hilar and right mediastinal lymph nodes and stable calcified granuloma in the right lower lobe. The esophagus is grossly normal. The thyroid gland is unremarkable. Lungs/Pleura: Stable stable emphysematous changes and areas of pulmonary scarring. Right upper lobe ground-glass opacity on image 28/8 measures 17.5 x 14 mm. This previously measured 16 x 12 mm. No solid nodular components are identified. The 5 mm nodule in the left lower lobe on the prior study on image 58/8 is no longer identified and was likely an area of inflammation. Stable large calcified granuloma in the right lower lobe. Small scattered sub solid nodular  lesions in the right upper lobe. None of these measures more than 4 mm. 4 mm nodule in the right lower lobe anteriorly on image 64/8 is stable. Stable area of right middle lobe bronchiectasis. Upper Abdomen: No significant upper abdominal findings. No worrisome hepatic or adrenal gland lesions. Stable mild enlargement and slight nodularity of the left adrenal gland. Stable advanced aortic and branch vessel calcifications and stable surgical changes from anterior abdominal wall  hernia repair. Stable calcified granulomas in the spleen and liver. Musculoskeletal: No chest wall mass, breast masses, supraclavicular or axillary adenopathy. The bony structures are unremarkable. IMPRESSION: 1. Stable emphysematous changes and areas of pulmonary scarring. 2. Slight interval enlargement of the ground-glass opacity in the right upper lobe. Continued surveillance versus PET-CT. 3. Other stable, some resolved and some new small scattered sub solid nodular lesions. 4. Stable calcified granuloma in the right lower lobe, calcified right mediastinal and hilar lymph nodes and calcified granulomas in the liver and spleen. 5. Stable advanced atherosclerotic calcifications involving the thoracic and abdominal aorta and branch vessels including three-vessel coronary artery calcifications. 6. Stable mild enlargement and nodularity of the left adrenal gland. Aortic Atherosclerosis (ICD10-I70.0) and Emphysema (ICD10-J43.9). Aortic Atherosclerosis (ICD10-I70.0) and Emphysema (ICD10-J43.9). Electronically Signed   By: Marijo Sanes M.D.   On: 11/19/2019 11:58      03/2019     04/2015   ADDENDUM REPORT: 12/14/2018 16:52  ADDENDUM: Cardiothoracic surgeon Dr. Servando Snare has requested a second opinion on this case.  ADDENDED FINDINGS:  Cardiovascular: Normal heart size. No significant pericardial effusion/thickening. Three-vessel coronary atherosclerosis. Atherosclerotic nonaneurysmal thoracic aorta. Top-normal caliber main  pulmonary artery (3 1 cm diameter) stable.  Mediastinum/Nodes: No discrete thyroid nodules. Unremarkable esophagus. No axillary adenopathy. Stable coarsely calcified nonenlarged right paratracheal and right hilar nodes from prior granulomatous disease. No pathologically enlarged mediastinal or discrete hilar nodes on this noncontrast scan.  Lungs/Pleura: No pneumothorax. No pleural effusion. Moderate centrilobular emphysema with diffuse bronchial wall thickening. Medial apical right upper lobe 2.0 x 1.3 cm ground-glass pulmonary nodule (series 3/image 29), previously 1.8 x 1.3 cm on 10/10/2017 chest CT and 1.7 x 1.2 cm on 10/25/2014 chest CT using similar measurement technique, minimally increased in size. Ground-glass 0.5 cm anterior right upper lobe pulmonary nodule (series 3/image 64) is stable since 10/25/2014 chest CT. Stable coarsely calcified 9 mm peripheral right lower lobe granuloma. Ground-glass 5 mm left lower lobe pulmonary nodule (series 3/image 68) and subpleural peripheral basilar 4 mm left lower lobe solid pulmonary nodule (series 3/image 104) are stable since 10/25/2014 chest CT. No acute consolidative airspace disease, lung masses or new significant pulmonary nodules. Stable moderate varicoid bronchiectasis in the posterior right middle lobe.  Upper abdomen: Stable granulomatous splenic calcifications. Stable appearance of the mildly thickened adrenal glands without discrete adrenal nodules.  Musculoskeletal: No aggressive appearing focal osseous lesions. Mild thoracic spondylosis.  ADDENDED IMPRESSION:  1. Dominant ground-glass pulmonary nodule in the medial apical right upper lobe measures 2.0 x 1.3 cm and has minimally increased in size on consecutive chest CT studies back to 10/25/2014. Continued chest CT surveillance warranted at a minimum. Please note that this addendum corrects a laterality error in the original report. 2. Additional subcentimeter  ground-glass and solid pulmonary nodules are unchanged back to 2016 chest CT. 3. No thoracic adenopathy. 4. Three-vessel coronary atherosclerosis.  Aortic Atherosclerosis (ICD10-I70.0) and Emphysema (ICD10-J43.9).  These addended results will be called to the ordering clinician or representative by the Radiologist Assistant, and communication documented in the PACS or zVision Dashboard.   Electronically Signed By: Ilona Sorrel M.D. On: 12/14/2018 16:52     Recent Lab Findings: Lab Results  Component Value Date   WBC 9.7 08/04/2020   HGB 15.4 08/04/2020   HCT 48.2 (H) 08/04/2020   PLT 234 08/04/2020   GLUCOSE 168 (H) 08/04/2020   CHOL 118 08/04/2020   TRIG 152 (H) 08/04/2020   HDL 40 (L) 08/04/2020   LDLDIRECT 146.4 01/30/2010  LDLCALC 55 08/04/2020   ALT 13 08/04/2020   AST 21 08/04/2020   NA 141 08/04/2020   K 4.6 08/04/2020   CL 103 08/04/2020   CREATININE 2.20 (H) 08/04/2020   BUN 35 (H) 08/04/2020   CO2 29 08/04/2020   TSH 1.58 08/04/2020   INR 0.87 01/25/2017   HGBA1C 8.7 (A) 09/22/2020    PFT's Severe Obstructive Airways Disease Insignificant response to bronchodilator Restrictive process suggested by lack of over-inflation Minimal Diffusion Defect FEV1 1.2   50% predicted  Assessment / Plan:   #1 groundglass opacity right upper lobe medially appears basically unchanged since 2016-other small nodules are unchanged   I have reviewed the CT scan with the patient and the persistent nature of the groundglass opacity in the right upper lobe.  She was not interested in surgical resection of this at this time . She is particularly concerned about her recent diagnosis of stage IV renal disease because of this did not want to proceed with any consideration of surgical resection and/or biopsy of the lesion in the right.  she is agreeable to a repeat CT super D in 6 months ,  #2 history of stroke x3, has known stable bilateral cerebral aneurysms  #3  underlying severe obstructive airway disease with FEV1 1.2  50% predicted.    In the office today her blood pressure is elevated she will discuss this with her primary care doctor and nephrologist about the coordinated treatment for her hypertension   Plan to have her come back to the thoracic surgery office in 6 months with a follow-up super D CT of the chest  Grace Isaac MD      [address] [place] [address] Office [phone]   [place]eeper [phone]  10/16/2020 3:25 PM

## 2020-10-16 ENCOUNTER — Ambulatory Visit
Admission: RE | Admit: 2020-10-16 | Discharge: 2020-10-16 | Disposition: A | Discharge status: Home / Self Care | Payer: Medicare Other | Source: Ambulatory Visit | Attending: Cardiothoracic Surgery | Admitting: Cardiothoracic Surgery

## 2020-10-16 ENCOUNTER — Other Ambulatory Visit: Payer: Self-pay

## 2020-10-16 ENCOUNTER — Ambulatory Visit (INDEPENDENT_AMBULATORY_CARE_PROVIDER_SITE_OTHER): Payer: Medicare Other | Admitting: Cardiothoracic Surgery

## 2020-10-16 VITALS — BP 200/65 | HR 64 | Temp 97.6°F | Resp 20 | Ht 66.0 in | Wt 188.0 lb

## 2020-10-16 DIAGNOSIS — I779 Disorder of arteries and arterioles, unspecified: Secondary | ICD-10-CM | POA: Diagnosis not present

## 2020-10-16 DIAGNOSIS — R911 Solitary pulmonary nodule: Secondary | ICD-10-CM

## 2020-10-16 DIAGNOSIS — R918 Other nonspecific abnormal finding of lung field: Secondary | ICD-10-CM | POA: Diagnosis not present

## 2020-10-28 ENCOUNTER — Other Ambulatory Visit: Payer: Self-pay

## 2020-10-28 ENCOUNTER — Telehealth: Payer: Self-pay | Admitting: *Deleted

## 2020-10-28 ENCOUNTER — Emergency Department (HOSPITAL_BASED_OUTPATIENT_CLINIC_OR_DEPARTMENT_OTHER): Payer: Medicare Other

## 2020-10-28 ENCOUNTER — Emergency Department (HOSPITAL_BASED_OUTPATIENT_CLINIC_OR_DEPARTMENT_OTHER)
Admission: EM | Admit: 2020-10-28 | Discharge: 2020-10-28 | Disposition: A | Discharge status: Home / Self Care | Payer: Medicare Other | Attending: Emergency Medicine | Admitting: Emergency Medicine

## 2020-10-28 DIAGNOSIS — N183 Chronic kidney disease, stage 3 unspecified: Secondary | ICD-10-CM | POA: Insufficient documentation

## 2020-10-28 DIAGNOSIS — I129 Hypertensive chronic kidney disease with stage 1 through stage 4 chronic kidney disease, or unspecified chronic kidney disease: Secondary | ICD-10-CM | POA: Diagnosis not present

## 2020-10-28 DIAGNOSIS — E1122 Type 2 diabetes mellitus with diabetic chronic kidney disease: Secondary | ICD-10-CM | POA: Insufficient documentation

## 2020-10-28 DIAGNOSIS — H938X2 Other specified disorders of left ear: Secondary | ICD-10-CM | POA: Insufficient documentation

## 2020-10-28 DIAGNOSIS — L040 Acute lymphadenitis of face, head and neck: Secondary | ICD-10-CM | POA: Diagnosis not present

## 2020-10-28 DIAGNOSIS — I10 Essential (primary) hypertension: Secondary | ICD-10-CM

## 2020-10-28 DIAGNOSIS — Z8673 Personal history of transient ischemic attack (TIA), and cerebral infarction without residual deficits: Secondary | ICD-10-CM

## 2020-10-28 DIAGNOSIS — E1169 Type 2 diabetes mellitus with other specified complication: Secondary | ICD-10-CM | POA: Insufficient documentation

## 2020-10-28 DIAGNOSIS — R519 Headache, unspecified: Secondary | ICD-10-CM | POA: Insufficient documentation

## 2020-10-28 DIAGNOSIS — Z7982 Long term (current) use of aspirin: Secondary | ICD-10-CM | POA: Diagnosis not present

## 2020-10-28 DIAGNOSIS — F17210 Nicotine dependence, cigarettes, uncomplicated: Secondary | ICD-10-CM | POA: Insufficient documentation

## 2020-10-28 DIAGNOSIS — Z794 Long term (current) use of insulin: Secondary | ICD-10-CM | POA: Insufficient documentation

## 2020-10-28 DIAGNOSIS — Z79899 Other long term (current) drug therapy: Secondary | ICD-10-CM | POA: Insufficient documentation

## 2020-10-28 DIAGNOSIS — I889 Nonspecific lymphadenitis, unspecified: Secondary | ICD-10-CM

## 2020-10-28 LAB — COMPREHENSIVE METABOLIC PANEL
ALT: 19 U/L (ref 0–44)
AST: 24 U/L (ref 15–41)
Albumin: 3.5 g/dL (ref 3.5–5.0)
Alkaline Phosphatase: 70 U/L (ref 38–126)
Anion gap: 9 (ref 5–15)
BUN: 24 mg/dL — ABNORMAL HIGH (ref 8–23)
CO2: 27 mmol/L (ref 22–32)
Calcium: 9 mg/dL (ref 8.9–10.3)
Chloride: 102 mmol/L (ref 98–111)
Creatinine, Ser: 1.86 mg/dL — ABNORMAL HIGH (ref 0.44–1.00)
GFR, Estimated: 28 mL/min — ABNORMAL LOW (ref >60)
Glucose, Bld: 264 mg/dL — ABNORMAL HIGH (ref 70–99)
Potassium: 4.1 mmol/L (ref 3.5–5.1)
Sodium: 138 mmol/L (ref 135–145)
Total Bilirubin: 0.4 mg/dL (ref 0.3–1.2)
Total Protein: 7 g/dL (ref 6.5–8.1)

## 2020-10-28 LAB — CBC
HCT: 47.7 % — ABNORMAL HIGH (ref 36.0–46.0)
Hemoglobin: 15.8 g/dL — ABNORMAL HIGH (ref 12.0–15.0)
MCH: 32.6 pg (ref 26.0–34.0)
MCHC: 33.1 g/dL (ref 30.0–36.0)
MCV: 98.6 fL (ref 80.0–100.0)
Platelets: 231 10*3/uL (ref 150–400)
RBC: 4.84 MIL/uL (ref 3.87–5.11)
RDW: 11.8 % (ref 11.5–15.5)
WBC: 12.5 10*3/uL — ABNORMAL HIGH (ref 4.0–10.5)
nRBC: 0 % (ref 0.0–0.2)

## 2020-10-28 LAB — URINALYSIS, MICROSCOPIC (REFLEX)

## 2020-10-28 LAB — URINALYSIS, ROUTINE W REFLEX MICROSCOPIC
Bilirubin Urine: NEGATIVE
Glucose, UA: >=500 mg/dL — AB
Ketones, ur: NEGATIVE mg/dL
Leukocytes,Ua: NEGATIVE
Nitrite: NEGATIVE
Protein, ur: >300 mg/dL — AB
Specific Gravity, Urine: 1.025 (ref 1.005–1.030)
pH: 5 (ref 5.0–8.0)

## 2020-10-28 LAB — LIPASE, BLOOD: Lipase: 52 U/L — ABNORMAL HIGH (ref 11–51)

## 2020-10-28 MED ORDER — CEFDINIR 300 MG PO CAPS: 300.0000 mg | ORAL_CAPSULE | Freq: Once | ORAL | Status: DC

## 2020-10-28 MED ORDER — ACETAMINOPHEN 500 MG PO TABS
1000.0000 mg | ORAL_TABLET | Freq: Once | ORAL | Status: DC
  Filled 2020-10-28: qty 2

## 2020-10-28 MED ORDER — CEFDINIR 300 MG PO CAPS: 300.0000 mg | ORAL_CAPSULE | Freq: Two times a day (BID) | ORAL | 0 refills | Status: DC

## 2020-10-28 MED ORDER — CEPHALEXIN 250 MG PO CAPS: 500.0000 mg | ORAL_CAPSULE | Freq: Once | ORAL | Status: DC

## 2020-10-28 NOTE — ED Provider Notes (Signed)
[facility] EMERGENCY DEPARTMENT Provider Note   CSN: [id number] Arrival date & time: 10/28/20  1317     History Chief Complaint  Patient presents with  . Headache    [name] is a 75 y.o. female.  Patient c/o dull headache for past couple days, gradual onset, moderate, general, non radiating, dull. States hx migraine, unsure if same. No new numbness or weakness. No change in speech or vision (pt notes visual symptoms x 1-2 months without acute change - saw ophthy, was told eyes ok/normal, and f/u neurology - pt indicates had not yet followed up). No eye pain. No neck pain or stiffness. Also c/o pain anterior/inferior to left ear for past day, constant, moderate, worse w palpation - states when looked in mirror, noted swelling to area today. Denies fever/chills. No ear pain or dental pain - states was at dentist yesterday and told everything looked good. Denies sore throat or trouble swallowing. No neck pain or swelling.   The history is provided by the patient.  Headache Associated symptoms: no abdominal pain, no cough, no ear pain, no eye pain, no fever, no hearing loss, no nausea, no neck pain, no neck stiffness, no photophobia, no sore throat and no vomiting        Past Medical History:  Diagnosis Date  . Abnormal findings on esophagogastroduodenoscopy (EGD) 07/2010  . Aneurysm [facility]) 2013   Right Brain   . 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 Anxiety   . Arthritis   . Cataract   . Chronic kidney disease   . Colon polyps   . Depression   . Diabetes mellitus 1998  . Diverticulosis   . Emphysema of lung ([place])   . ESOPHAGEAL STRICTURE 08/27/2008  . GERD (gastroesophageal reflux disease)   . Hemorrhoids   . Hiatal hernia   . Hyperlipidemia   . Hypertension 2000  . Migraines   . Peripheral vascular disease ([place])   . Phlebitis    30 years ago  left leg  . Stroke [facility])    multiple mini strokes ( brain aneurysm )    Patient Active Problem List   Diagnosis  Date Noted  . Carotid artery stenosis 12/03/2019  . Migraine with aura and without status migrainosus, not intractable 11/28/2019  . Hyperlipidemia associated with type 2 diabetes mellitus ([place]) 11/10/2019  . Coronary artery calcification 08/05/2019  . 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 SOB (shortness of breath) 11/17/2018  . Cardiomegaly 11/17/2018  . Emphysema of lung ([place]) 10/10/2017  . Atherosclerosis of aorta ([place]) 10/10/2017  . Lung nodule 10/10/2017  . Anxiety 08/22/2017  . Arthritis 08/22/2017  . Neuropathy due to secondary diabetes ([place]) 08/22/2017  . PVD (peripheral vascular disease) ([place]) 04/13/2017  . Type 2 diabetes mellitus with circulatory disorder, with long-term current use of insulin ([place]) 11/07/2015  . Encounter for Medicare annual wellness exam 06/24/2015  . Generalized anxiety disorder 03/26/2015  . Depression, major, recurrent, in partial remission ([place]) 03/26/2015  . History of CVA (cerebrovascular accident) 10/04/2014  . Tobacco abuse 10/04/2014  . Vitamin D deficiency 11/02/2013  . Medication management 11/02/2013  . Peripheral vascular disease due to secondary diabetes mellitus ([place]) 05/08/2012  . Atherosclerosis of native arteries of extremity with intermittent claudication 12/24/2011  . Diverticulosis of large intestine 06/09/2010  . History of colonic polyps 06/09/2010  . Esophageal reflux 08/27/2008  . Nontoxic multinodular goiter 03/11/2008  . 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  Brain aneurysm 03/11/2008  . CKD stage 3 due to type 2 diabetes mellitus ([place]) 05/01/2007  . Essential hypertension 05/01/2007    Past Surgical History:  Procedure Laterality Date  . ABDOMINAL AORTAGRAM N/A 01/04/2012   Procedure: ABDOMINAL Maxcine Ham;  Surgeon: Serafina Mitchell, MD;  Location: [facility] CATH LAB;  Service: Cardiovascular;  Laterality: N/A;  . ABDOMINAL AORTAGRAM N/A 08/15/2012   Procedure: ABDOMINAL Maxcine Ham;   Surgeon: Serafina Mitchell, MD;  Location: [facility] CATH LAB;  Service: Cardiovascular;  Laterality: N/A;  . ABDOMINAL HYSTERECTOMY  1984  . cataract surgery Right 10-22-2015  . cataract surgery Left 11-05-2015  . CESAREAN SECTION    . CHOLECYSTECTOMY    . COLONOSCOPY  07/2010  . DENTAL SURGERY  Aug. 16, 2013   left lower   . ESOPHAGOGASTRODUODENOSCOPY  07/2010  . EYE SURGERY  Nov. 2014   Laser-Glaucoma  . FEMORAL ARTERY STENT  05/11/11   Left superficial femoral and popliteal artery  . HERNIA REPAIR     times two  . KNEE SURGERY    . LOWER EXTREMITY ANGIOGRAM Left 08/15/2012   Procedure: LOWER EXTREMITY ANGIOGRAM;  Surgeon: Serafina Mitchell, MD;  Location: [facility] CATH LAB;  Service: Cardiovascular;  Laterality: Left;  lt leg angio  . rotator cuff surgery    . THYROID SURGERY       OB History   No obstetric history on file.     Family History  Problem Relation Age of Onset  . CAD Mother 43       Died of MI  . Hypertension Mother   . Heart attack Mother   . Heart disease Mother   . CAD Father 61       Died of MI  . Heart disease Father   . Heart attack Father   . CAD Brother 73       Two brothers died of MI  . Heart attack Brother   . Heart disease Brother        Amputation  . Diabetes Sister   . Hypertension Sister   . Heart attack Brother   . Heart disease Brother   . Stroke Sister   . Colon cancer Neg Hx   . Stomach cancer Neg Hx   . Esophageal cancer Neg Hx     Social History   Tobacco Use  . Smoking status: Heavy Tobacco Smoker    Packs/day: 0.50    Years: 40.00    Pack years: 20.00    Types: Cigarettes  . Smokeless tobacco: Never Used  . Tobacco comment: in process of quiting  Vaping Use  . Vaping Use: Never used  Substance Use Topics  . Alcohol use: No    Alcohol/week: 0.0 standard drinks  . Drug use: No    Home Medications Prior to Admission medications   Medication Sig Start Date End Date Taking? Authorizing Provider  albuterol (VENTOLIN HFA) 108  (90 Base) MCG/ACT inhaler Inhale 2 puffs into the lungs every 4 (four) hours as needed for wheezing or shortness of breath. 08/27/19   Julian Hy, DO  amLODipine (NORVASC) 10 MG tablet TAKE 1 TABLET DAILY FOR BLOOD PRESSURE 05/07/20   [name], MD  aspirin EC 81 MG tablet Take 81 mg by mouth at bedtime.     [provider]  Blood Glucose Monitoring Suppl (ACCU-CHEK AVIVA PLUS) W/DEVICE KIT Check blood sugar up to three times daily 03/26/15   Vladimir Crofts, PA-C  Cholecalciferol (VITAMIN D PO) Take 5,000  Units by mouth daily.    [provider]  CINNAMON PO Take 1,000 mg by mouth 2 (two) times daily.     [provider]  citalopram (CELEXA) 40 MG tablet TAKE 1 TABLET DAILY FOR MOOD & ANXIETY 08/18/20   McClanahan, Danton Sewer, NP  clonazePAM (KLONOPIN) 0.5 MG tablet TAKE 1/2 - 1 TABLET AT BEDTIME ONLY IF NEEDED FORSLEEP & LIMIT TO 5 DAYS /WEEK TO AVOID ADDICTION & DEMENTIA. SCRIPT SHOULD LAST LONGER THAN 30 DAYS. 10/05/20   Garnet Sierras, NP  dapagliflozin propanediol (FARXIGA) 10 MG TABS tablet Take 10 mg by mouth daily before breakfast. 10/08/19   Philemon Kingdom, MD  glucose blood (ACCU-CHEK AVIVA PLUS) test strip Use daily to check BS TID Dx. E11.22 08/05/15   Rolene Course, PA-C  insulin aspart (NOVOLOG FLEXPEN) 100 UNIT/ML FlexPen Inject 6-8 Units into the skin 3 (three) times daily with meals. Patient taking differently: Inject 14 Units into the skin 3 (three) times daily with meals. 01/14/20   Philemon Kingdom, MD  Insulin Pen Needle 29G X 5MM MISC Use with insulin pen 3 times a day. 01/16/20   Philemon Kingdom, MD  Magnesium 500 MG TABS Take 500 mg by mouth daily.    [provider]  olmesartan (BENICAR) 40 MG tablet TAKE 1 TABLET DAILY FOR BLOOD PRESSURE 05/07/20   [name], MD  Omega-3 Fatty Acids (FISH OIL) 1200 MG CAPS Take 1,200 capsules by mouth daily.     [provider]  omeprazole (PRILOSEC) 40 MG capsule Take 1 capsule  (40 mg total) by mouth in the morning and at bedtime. 07/22/20   Irene Shipper, MD  OVER THE COUNTER MEDICATION Take 1 tablet by mouth daily. One a day vitamin Woman's one a day    [provider]  rosuvastatin (CRESTOR) 40 MG tablet TAKE 1 /2 TO 1 TABLET DAILY FOR CHOLESTEROL 06/20/20   McClanahan, Kyra, NP  sulfamethoxazole-trimethoprim (BACTRIM DS) 800-160 MG tablet Take 1 tablet by mouth 2 (two) times daily. Patient not taking: Reported on 10/16/2020 07/30/20   Garnet Sierras, NP  Tiotropium Bromide Monohydrate (SPIRIVA RESPIMAT) 2.5 MCG/ACT AERS Inhale 1 puff into the lungs daily. 08/27/19   Carlis Abbott, Venita Sheffield, DO  TRESIBA FLEXTOUCH 200 UNIT/ML FlexTouch Pen INJECT 34 UNITS DAILY UNDER SKIN 10/06/20   Philemon Kingdom, MD  vitamin E 400 UNIT capsule Take 400 Units by mouth daily.    [provider]    Allergies    Ciprofloxacin, Ozempic (0.25 or 0.5 mg-dose) [semaglutide(0.25 or 0.5mg -dos)], Plavix [clopidogrel bisulfate], Amoxicillin, Ace inhibitors, Ciprofloxacin hcl, Lyrica [pregabalin], and Penicillins  Review of Systems   Review of Systems  Constitutional: Negative for chills and fever.  HENT: Negative for dental problem, ear pain, hearing loss, rhinorrhea, sore throat, tinnitus, trouble swallowing and voice change.   Eyes: Negative for photophobia, pain and redness.       No acute visual changes - blurry vision/change in vision x 1-2 months.   Respiratory: Negative for cough and shortness of breath.   Cardiovascular: Negative for chest pain.  Gastrointestinal: Negative for abdominal pain, nausea and vomiting.  Genitourinary: Negative for flank pain.  Musculoskeletal: Negative for neck pain and neck stiffness.  Skin: Negative for rash.  Neurological: Positive for headaches. Negative for speech difficulty.  Hematological: Does not bruise/bleed easily.  Psychiatric/Behavioral: Negative for confusion.    Physical Exam Updated Vital Signs BP (!) 187/73 (BP Location:  Right Arm)   Pulse 88   Temp 98.1 F (36.7 C) (  Oral)   Resp (!) 24   Ht 1.676 m ($Remove'5\' 6"'eWDjuqS$ )   Wt 84.4 kg   SpO2 96%   BMI 30.02 kg/m   Physical Exam Vitals and nursing note reviewed.  Constitutional:      General: She is not in acute distress.    Appearance: Normal appearance. She is well-developed and well-nourished. She is not diaphoretic.  HENT:     Head: Atraumatic.     Comments: No sinus or temporal tenderness.     Right Ear: Tympanic membrane, ear canal and external ear normal.     Left Ear: Ear canal and external ear normal.     Ears:     Comments: Cloudy fluid behind left TM, no erythema.  Tender left preauricular and subauricular l/a.     Nose: Nose normal.     Mouth/Throat:     Mouth: Oropharynx is clear and moist. Mucous membranes are moist.     Pharynx: Oropharynx is clear. No oropharyngeal exudate or posterior oropharyngeal erythema.     Comments: No dental tenderness, no gum swelling, no trismus.  No swelling to floor of mouth. Pharynx normal.      Comments: No sinus or temporal tenderness.Eyes:     General: No scleral icterus.    Extraocular Movements: Extraocular movements intact and EOM normal.     Conjunctiva/sclera: Conjunctivae normal.     Pupils: Pupils are equal, round, and reactive to light.  Neck:     Thyroid: No thyromegaly.     Trachea: No tracheal deviation.     Comments: No stiffness or rigidity.  Cardiovascular:     Rate and Rhythm: Normal rate and regular rhythm.     Pulses: Normal pulses and intact distal pulses.     Heart sounds: Normal heart sounds. No murmur heard. No friction rub. No gallop.   Pulmonary:     Effort: Pulmonary effort is normal. No respiratory distress.     Breath sounds: Normal breath sounds.  Abdominal:     General: There is no distension.     Palpations: Abdomen is soft.     Tenderness: There is no abdominal tenderness.  Genitourinary:    Comments: No cva tenderness.  Musculoskeletal:        General: No swelling,  tenderness or edema. Normal range of motion.     Cervical back: Normal range of motion and neck supple. No rigidity or tenderness. No muscular tenderness.  Skin:    General: Skin is warm and dry.     Findings: No rash.  Neurological:     Mental Status: She is alert and oriented to person, place, and time.     Cranial Nerves: No cranial nerve deficit.     Comments: Alert, speech normal. No facial asymmetry, weakness or droop. Motor/sens grossly intact bil. Steady gait.   Psychiatric:        Mood and Affect: Mood and affect and mood normal.     ED Results / Procedures / Treatments   Labs (all labs ordered are listed, but only abnormal results are displayed) Results for orders placed or performed during the hospital encounter of 10/28/20  Lipase, blood  Result Value Ref Range   Lipase 52 (H) 11 - 51 U/L  Comprehensive metabolic panel  Result Value Ref Range   Sodium 138 135 - 145 mmol/L   Potassium 4.1 3.5 - 5.1 mmol/L   Chloride 102 98 - 111 mmol/L   CO2 27 22 - 32 mmol/L   Glucose, Bld 264 (  H) 70 - 99 mg/dL   BUN 24 (H) 8 - 23 mg/dL   Creatinine, Ser 1.86 (H) 0.44 - 1.00 mg/dL   Calcium 9.0 8.9 - 10.3 mg/dL   Total Protein 7.0 6.5 - 8.1 g/dL   Albumin 3.5 3.5 - 5.0 g/dL   AST 24 15 - 41 U/L   ALT 19 0 - 44 U/L   Alkaline Phosphatase 70 38 - 126 U/L   Total Bilirubin 0.4 0.3 - 1.2 mg/dL   GFR, Estimated 28 (L) >60 mL/min   Anion gap 9 5 - 15  CBC  Result Value Ref Range   WBC 12.5 (H) 4.0 - 10.5 K/uL   RBC 4.84 3.87 - 5.11 MIL/uL   Hemoglobin 15.8 (H) 12.0 - 15.0 g/dL   HCT 47.7 (H) 36.0 - 46.0 %   MCV 98.6 80.0 - 100.0 fL   MCH 32.6 26.0 - 34.0 pg   MCHC 33.1 30.0 - 36.0 g/dL   RDW 11.8 11.5 - 15.5 %   Platelets 231 150 - 400 K/uL   nRBC 0.0 0.0 - 0.2 %  Urinalysis, Routine w reflex microscopic Urine, Clean Catch  Result Value Ref Range   Color, Urine YELLOW YELLOW   APPearance CLEAR CLEAR   Specific Gravity, Urine 1.025 1.005 - 1.030   pH 5.0 5.0 - 8.0    Glucose, UA >=500 (A) NEGATIVE mg/dL   Hgb urine dipstick SMALL (A) NEGATIVE   Bilirubin Urine NEGATIVE NEGATIVE   Ketones, ur NEGATIVE NEGATIVE mg/dL   Protein, ur >300 (A) NEGATIVE mg/dL   Nitrite NEGATIVE NEGATIVE   Leukocytes,Ua NEGATIVE NEGATIVE  Urinalysis, Microscopic (reflex)  Result Value Ref Range   RBC / HPF 6-10 0 - 5 RBC/hpf   WBC, UA 0-5 0 - 5 WBC/hpf   Bacteria, UA MANY (A) NONE SEEN   Squamous Epithelial / LPF 0-5 0 - 5   Mucus PRESENT    Granular Casts, UA PRESENT    CT HEAD WO CONTRAST  Result Date: 10/28/2020 CLINICAL DATA:  Ruptured blood vessel in eye, right eye blurred vision for 1 month, headache for 4 days, nausea, left-sided facial swelling EXAM: CT HEAD WITHOUT CONTRAST TECHNIQUE: Contiguous axial images were obtained from the base of the skull through the vertex without intravenous contrast. COMPARISON:  06/06/2018, 03/05/2019 FINDINGS: Brain: Focal hypodensity left occipital cortex image 16, new since prior study, consistent with age-indeterminate infarct. Chronic encephalomalacia right cerebellar hemisphere. No acute hemorrhage. Lateral ventricles and midline structures are unremarkable. No acute extra-axial fluid collections. No mass effect. Vascular: No hyperdense vessel or unexpected calcification. Skull: Normal. Negative for fracture or focal lesion. Sinuses/Orbits: No acute finding. Other: None. IMPRESSION: 1. 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 2. Stable chronic ischemic changes right cerebellar hemisphere. 3. No acute hemorrhage. Electronically Signed   By: Randa Ngo M.D.   On: 10/28/2020 16:16   VAS [place] ABI WITH/WO TBI  Result Date: 10/06/2020 LOWER EXTREMITY DOPPLER STUDY Indications: Claudication, and gangrene. High Risk Factors: Hypertension, hyperlipidemia, Diabetes, current smoker.  Vascular Interventions: 05/10/2011: Left superficial femoral artery stent w/                         stent angioplasty  01/04/2012.                          08/15/2012: Angioplasty of left superficial femoral  artery and popliteal artery. Comparison Study: 12/03/2019: Rt ABI 0.53, Lt ABI 0.56. Performing Technologist: Ivan Croft  Examination Guidelines: A complete evaluation includes at minimum, Doppler waveform signals and systolic blood pressure reading at the level of bilateral brachial, anterior tibial, and posterior tibial arteries, when vessel segments are accessible. Bilateral testing is considered an integral part of a complete examination. Photoelectric Plethysmograph (PPG) waveforms and toe systolic pressure readings are included as required and additional duplex testing as needed. Limited examinations for reoccurring indications may be performed as noted.  ABI Findings: +---------+------------------+-----+----------+--------+ Right    Rt Pressure (mmHg)IndexWaveform  Comment  +---------+------------------+-----+----------+--------+ Brachial 175                                       +---------+------------------+-----+----------+--------+ PTA      79                0.45 biphasic           +---------+------------------+-----+----------+--------+ DP       71                0.41 monophasic         +---------+------------------+-----+----------+--------+ Great Toe34                0.19                    +---------+------------------+-----+----------+--------+ +---------+------------------+-----+--------+-------+ Left     Lt Pressure (mmHg)IndexWaveformComment +---------+------------------+-----+--------+-------+ Brachial 174                                    +---------+------------------+-----+--------+-------+ PTA      80                0.46 biphasic        +---------+------------------+-----+--------+-------+ DP       76                0.43 biphasic        +---------+------------------+-----+--------+-------+ Great Toe50                0.29                  +---------+------------------+-----+--------+-------+ +-------+-----------+-----------+------------+------------+ ABI/TBIToday's ABIToday's TBIPrevious ABIPrevious TBI +-------+-----------+-----------+------------+------------+ Right  0.45       0.19       0.53        0.37         +-------+-----------+-----------+------------+------------+ Left   0.46       0.29       0.56        0.44         +-------+-----------+-----------+------------+------------+  Bilateral ABIs and TBIs appear decreased compared to prior study on 12/03/2019.  Summary: Right: Resting right ankle-brachial index indicates severe right lower extremity arterial disease. The right toe-brachial index is abnormal. Left: Resting left ankle-brachial index indicates severe left lower extremity arterial disease. The left toe-brachial index is abnormal.  *See table(s) above for measurements and observations.  Electronically signed by Servando Snare MD on 10/06/2020 at 7:20:40 AM.    Final    VAS US CAROTID  Result Date: 10/06/2020 Carotid Arterial Duplex Study Indications:       CVA and Carotid artery disease. Risk Factors:      Hypertension, hyperlipidemia, Diabetes, current smoker. Comparison Study:  12/03/2019: Rt ICA 1-39% stenosis,  Lt ICA 1-39% stenosis. Lt                    CCA >50% stenosis. Performing Technologist: Ivan Croft  Examination Guidelines: A complete evaluation includes B-mode imaging, spectral Doppler, color Doppler, and power Doppler as needed of all accessible portions of each vessel. Bilateral testing is considered an integral part of a complete examination. Limited examinations for reoccurring indications may be performed as noted.  Right Carotid Findings: +----------+--------+--------+--------+------------------+--------+           PSV cm/sEDV cm/sStenosisPlaque DescriptionComments +----------+--------+--------+--------+------------------+--------+ CCA Prox  169     5                                           +----------+--------+--------+--------+------------------+--------+ CCA Mid   121     11              heterogenous               +----------+--------+--------+--------+------------------+--------+ CCA Distal95      5               heterogenous               +----------+--------+--------+--------+------------------+--------+ ICA Prox  175     32      1-39%   calcific                   +----------+--------+--------+--------+------------------+--------+ ICA Mid   162     25                                         +----------+--------+--------+--------+------------------+--------+ ICA Distal128     17                                         +----------+--------+--------+--------+------------------+--------+ ECA       207     0               heterogenous               +----------+--------+--------+--------+------------------+--------+ +----------+--------+-------+----------------+-------------------+           PSV cm/sEDV cmsDescribe        Arm Pressure (mmHG) +----------+--------+-------+----------------+-------------------+ Subclavian320            Multiphasic, WNL                    +----------+--------+-------+----------------+-------------------+ +---------+--------+--+--------+-+---------+ VertebralPSV cm/s66EDV cm/s9Antegrade +---------+--------+--+--------+-+---------+  Left Carotid Findings: +----------+--------+--------+--------+-------------------------+--------+           PSV cm/sEDV cm/sStenosisPlaque Description       Comments +----------+--------+--------+--------+-------------------------+--------+ CCA Prox  109     11                                                +----------+--------+--------+--------+-------------------------+--------+ CCA Mid   338     45      >50%    irregular and calcific            +----------+--------+--------+--------+-------------------------+--------+ CCA Distal366      29      >  50%    irregular and calcific            +----------+--------+--------+--------+-------------------------+--------+ ICA Prox  81      16      1-39%   heterogenous and calcific         +----------+--------+--------+--------+-------------------------+--------+ ICA Mid   99      16                                                +----------+--------+--------+--------+-------------------------+--------+ ICA Distal79      13                                                +----------+--------+--------+--------+-------------------------+--------+ ECA       325     0               heterogenous                      +----------+--------+--------+--------+-------------------------+--------+ +----------+--------+--------+----------------+-------------------+           PSV cm/sEDV cm/sDescribe        Arm Pressure (mmHG) +----------+--------+--------+----------------+-------------------+ [id number]             Multiphasic, WNL                    +----------+--------+--------+----------------+-------------------+ +---------+--------+--+--------+-+---------+ VertebralPSV cm/s64EDV cm/s9Antegrade +---------+--------+--+--------+-+---------+   Summary: Right Carotid: Velocities in the right ICA are consistent with a 1-39% stenosis. Left Carotid: Velocities in the left ICA are consistent with a 1-39% stenosis.               There is hemodynamically significant plaque with >50% stenosis in               the left mid to distal CCA. Vertebrals: Bilateral vertebral arteries demonstrate antegrade flow. *See table(s) above for measurements and observations.  Electronically signed by Servando Snare MD on 10/06/2020 at 7:20:57 AM.    Final    CT Super D Chest Wo Contrast  Result Date: 10/16/2020 CLINICAL DATA:  FOLLOW-UP LUNG NODULE EXAM: CT CHEST WITHOUT CONTRAST TECHNIQUE: Multidetector CT imaging of the chest was performed using thin slice collimation for electromagnetic  bronchoscopy planning purposes, without intravenous contrast. COMPARISON:  06/19/20 FINDINGS: Cardiovascular: Mild cardiac enlargement. Aortic atherosclerosis. Coronary artery atherosclerotic calcifications. No pericardial effusion. Mediastinum/Nodes: No enlarged mediastinal, hilar, or axillary lymph nodes. Thyroid gland, trachea, and esophagus demonstrate no significant findings. Lungs/Pleura: Paraseptal and centrilobular emphysema. No pleural effusion, airspace consolidation or atelectasis. Calcified granuloma identified in the superior segment of right lower lobe. Within the medial right apex there is a pure ground-glass attenuating nodule which measures 1.6 x 1.3 cm, image 30/8. Unchanged from previous exam. No new or enlarging nodules. Upper Abdomen: No acute abnormality.  Aortic atherosclerosis. Musculoskeletal: No chest wall mass or suspicious bone lesions identified. IMPRESSION: 1. Stable appearance of pure ground-glass attenuating nodule within the medial right apex. Adenocarcinoma cannot be excluded. Follow up by CT is recommended in 12 months, with continued annual surveillance for a minimum of 3 years.These recommendations are taken from: Recommendations for the Management of Subsolid Pulmonary Nodules Detected at CT: A Statement from the [facility] 2013; 266:1, 902-111. 2. Aortic Atherosclerosis (ICD10-I70.0) and Emphysema (  ICD10-J43.9). 3. Coronary artery calcifications. Electronically Signed   By: Kerby Moors M.D.   On: 10/16/2020 14:22    EKG EKG Interpretation  Date/Time:  Tuesday October 28 2020 16:44:41 EST Ventricular Rate:  69 PR Interval:    QRS Duration: 99 QT Interval:  455 QTC Calculation: 488 R Axis:   40 Text Interpretation: Sinus rhythm Borderline prolonged QT interval Baseline wander Confirmed by Lajean Saver [phone]) on 10/28/2020 4:49:56 PM   Radiology CT HEAD WO CONTRAST  Result Date: 10/28/2020 CLINICAL DATA:  Ruptured blood vessel in eye, right  eye blurred vision for 1 month, headache for 4 days, nausea, left-sided facial swelling EXAM: CT HEAD WITHOUT CONTRAST TECHNIQUE: Contiguous axial images were obtained from the base of the skull through the vertex without intravenous contrast. COMPARISON:  06/06/2018, 03/05/2019 FINDINGS: Brain: Focal hypodensity left occipital cortex image 16, new since prior study, consistent with age-indeterminate infarct. Chronic encephalomalacia right cerebellar hemisphere. No acute hemorrhage. Lateral ventricles and midline structures are unremarkable. No acute extra-axial fluid collections. No mass effect. Vascular: No hyperdense vessel or unexpected calcification. Skull: Normal. Negative for fracture or focal lesion. Sinuses/Orbits: No acute finding. Other: None. IMPRESSION: 1. 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 2. Stable chronic ischemic changes right cerebellar hemisphere. 3. No acute hemorrhage. Electronically Signed   By: Randa Ngo M.D.   On: 10/28/2020 16:16    Procedures Procedures   Medications Ordered in ED Medications - No data to display  ED Course  I have reviewed the triage vital signs and the nursing notes.  Pertinent labs & imaging results that were available during my care of the patient were reviewed by me and considered in my medical decision making (see chart for details).    MDM Rules/Calculators/A&P                         Iv ns. Labs. CT.  Reviewed nursing notes and prior charts for additional history.   Fluid behind left TM, and  Tender, left pre-sub auricular l/a. Will tx abx therapy.  Labs reviewed/interpreted by me - Glucose elevated 264, hco3 and other chem normal. Renal fxn c/w baseline.   CT reviewed/interpreted by me - no hem. (as compared to prior 2020 imaging, new occipital finding/cva noted - pt did not visual symptoms onset 1 month ago, ?whether possible cva then).  In past several days, no acute/new change in speech  or vision, no new numbness/weakness, or change in baseline functional ability.   Omnicef po, acetaminophen po.   Po fluids.   Ambulate in ED, steady gait.   Pt currently appears stable for d/c. Given visual symptoms x 1 month, occipital area on CT, will consult/discuss w neurology.  Discussed pt with Dr Cheral Marker, he reviewed ct, indicates new from prior mri, but not acute - indicates outpt neurology f/u.        Final Clinical Impression(s) / ED Diagnoses Final diagnoses:  None    Rx / DC Orders ED Discharge Orders    None       Lajean Saver, MD 10/28/20 1905

## 2020-10-28 NOTE — ED Notes (Addendum)
Asked patient when her last known normal was, states 1 week ago. Messaged Dr. Tyrone Nine regarding ordering CT scan

## 2020-10-28 NOTE — ED Notes (Signed)
Pt hx of right sided weakness/decreased sensation from prior stroke 3 years ago. No new deficits noted.

## 2020-10-28 NOTE — ED Triage Notes (Signed)
2 days ago pt states she had a blood vessel in eye "popped" which she states happens. States vision in right eye has been blurred for the past month, saw eye doctor. Has had mild headache x 4 days. Awoke this am with slight nausea and swelling to left side of face/neck with pain to touch. Hx of strokes.

## 2020-10-28 NOTE — ED Notes (Signed)
Left sided facial pain and swelling she states , had teeth cleaned yesterday she states

## 2020-10-28 NOTE — Telephone Encounter (Signed)
Patient called and reported she has a headache and dizziness. States this morning  her left neck and face became swollen and hard to the touch. Per Dr [name], the patient was advised to go to the ER or [facility] in [facility].

## 2020-10-28 NOTE — Discharge Instructions (Addendum)
It was our pleasure to provide your ER care today - we hope that you feel better.  For swollen/tender area by left ear, take antibiotic as prescribed. Warm compresses to sore area. Take acetaminophen as need.   As we discussed, your current CT scan shows a new stroke as compared to your MRI in 2020 - this may be related to your visual symptoms in past couple months.  Take an full strength enteric coated aspirin a day. Follow up with neurologist in the coming week - call office tomorrow AM to arrange appointment.   Return to [facility] ER if worse, new symptoms, increased swelling, severe pain, high fevers, severe headache, new changes in speech or vision or new numbness/weakness, or other concern.

## 2020-10-29 ENCOUNTER — Encounter: Payer: Self-pay | Admitting: Internal Medicine

## 2020-10-30 ENCOUNTER — Other Ambulatory Visit: Payer: Self-pay | Admitting: Internal Medicine

## 2020-11-06 ENCOUNTER — Other Ambulatory Visit: Payer: Self-pay | Admitting: Adult Health Nurse Practitioner

## 2020-11-06 DIAGNOSIS — F411 Generalized anxiety disorder: Secondary | ICD-10-CM

## 2020-11-17 ENCOUNTER — Encounter (HOSPITAL_COMMUNITY): Payer: Medicare Other

## 2020-11-17 ENCOUNTER — Ambulatory Visit: Payer: Medicare Other | Admitting: Surgery

## 2020-11-18 ENCOUNTER — Ambulatory Visit (INDEPENDENT_AMBULATORY_CARE_PROVIDER_SITE_OTHER): Payer: Medicare Other | Admitting: Diagnostic Neuroimaging

## 2020-11-18 ENCOUNTER — Telehealth: Payer: Self-pay | Admitting: Diagnostic Neuroimaging

## 2020-11-18 ENCOUNTER — Encounter: Payer: Self-pay | Admitting: Diagnostic Neuroimaging

## 2020-11-18 VITALS — BP 171/70 | HR 68 | Ht 66.0 in | Wt 189.0 lb

## 2020-11-18 DIAGNOSIS — I779 Disorder of arteries and arterioles, unspecified: Secondary | ICD-10-CM

## 2020-11-18 DIAGNOSIS — I639 Cerebral infarction, unspecified: Secondary | ICD-10-CM | POA: Diagnosis not present

## 2020-11-18 NOTE — Patient Instructions (Signed)
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

## 2020-11-18 NOTE — Telephone Encounter (Signed)
medicare/cigna order sent to GI. They will obtain the auth for cigna and reach out to the patient to schedule.

## 2020-11-18 NOTE — Progress Notes (Signed)
[facility]  PATIENT: [name] DOB: 24-Nov-1945  REFERRING CLINICIAN: [name], MD HISTORY FROM: patient  REASON FOR VISIT: new consult    HISTORICAL  CHIEF COMPLAINT:  Chief Complaint  Patient presents with  . Headache    Rm 7 ED referral "seen in ED, found ear infection and evidence I had another TIA, blurred vision in right eye, off balance alot"    HISTORY OF PRESENT ILLNESS:   UPDATE (11/18/20, VRP): Since last visit, more blurred vision. Went to ER for left left ear pain; had CT head for eval and found to have age indeterminate stroke in left occipital region. Has had general blurred vision for past 5 months, seeing ophthal.   Dr. Erlinda Hong: 04/12/17 PRIOR HPI: "75 y.o.femalewith history of brain aneurysm, diabetes, hyperlipidemia, hypertension, migraines, PVD s/p fem-pop bypass and stent, TIA admitted on 01/25/17 for Right facial droop with Right-sided weakness with decreased sensation. MRI showed small left lateral thalamus and IC infarct as well as old right cerebellar infarct. MRA showed moderate left PCA stenosis and right ICA siphon 94m aneurysm. CTA neck b/l ICA 30% and left CCA 50% stenosis, b/l VA mild stenosis. EF 60-65%. LDL 92 and A1C 8.9. She has plavix allergy, so continued on ASA and zocor on discharge."   REVIEW OF SYSTEMS: Full 14 system review of systems performed and negative with exception of: as per HPI.  ALLERGIES: Allergies  Allergen Reactions  . Ciprofloxacin Other (See Comments)  . Ozempic (0.25 Or 0.5 Mg-Dose) [Semaglutide(0.25 Or 0.555mDos)] Nausea And Vomiting  . Plavix [Clopidogrel Bisulfate] Palpitations  . Amoxicillin Itching and Swelling    FACE & EYES SWELL  . Ace Inhibitors   . Ciprofloxacin Hcl Swelling  . Lyrica [Pregabalin]     Itch, gain weight  . Penicillins Other (See Comments)    REACTION: unspecified    HOME MEDICATIONS: Outpatient Medications Prior to Visit  Medication Sig Dispense Refill  .  amLODipine (NORVASC) 10 MG tablet TAKE 1 TABLET DAILY FOR BLOOD PRESSURE 90 tablet 3  . aspirin 325 MG EC tablet Take 325 mg by mouth daily.    . Marland Kitchen complex vitamins capsule Take 1 capsule by mouth daily.    . Blood Glucose Monitoring Suppl (ACCU-CHEK AVIVA PLUS) W/DEVICE KIT Check blood sugar up to three times daily 1 kit 0  . CINNAMON PO Take 1,000 mg by mouth 2 (two) times daily.     . citalopram (CELEXA) 40 MG tablet TAKE 1 TABLET DAILY FOR MOOD & ANXIETY 90 tablet 3  . clonazePAM (KLONOPIN) 0.5 MG tablet TAKE 1/2 - 1 TABLET AT BEDTIME ONLY IF NEEDED FORSLEEP & LIMIT TO 5 DAYS /WEEK TO AVOID ADDICTION & DEMENTIA. SCRIPT SHOULD LAST LONGER THAN 30 DAYS. 30 tablet 0  . FARXIGA 10 MG TABS tablet TAKE 10 MG BY MOUTH DAILY BEFORE BREAKFAST. 90 tablet 3  . glucose blood (ACCU-CHEK AVIVA PLUS) test strip Use daily to check BS TID Dx. E11.22 300 each 1  . insulin aspart (NOVOLOG FLEXPEN) 100 UNIT/ML FlexPen Inject 6-8 Units into the skin 3 (three) times daily with meals. (Patient taking differently: Inject 14 Units into the skin 3 (three) times daily with meals.) 15 mL 11  . Insulin Pen Needle 29G X 5MM MISC Use with insulin pen 3 times a day. 300 each 11  . olmesartan (BENICAR) 40 MG tablet TAKE 1 TABLET DAILY FOR BLOOD PRESSURE 90 tablet 3  . Omega-3 Fatty Acids (FISH OIL) 1200 MG CAPS Take 1,200  capsules by mouth daily.     Marland Kitchen omeprazole (PRILOSEC) 40 MG capsule Take 1 capsule (40 mg total) by mouth in the morning and at bedtime. 180 capsule 3  . TRESIBA FLEXTOUCH 200 UNIT/ML FlexTouch Pen INJECT 34 UNITS DAILY UNDER SKIN 9 mL 2  . albuterol (VENTOLIN HFA) 108 (90 Base) MCG/ACT inhaler Inhale 2 puffs into the lungs every 4 (four) hours as needed for wheezing or shortness of breath. (Patient not taking: Reported on 11/18/2020) 18 g 11  . Cholecalciferol (VITAMIN D PO) Take 5,000 Units by mouth daily. (Patient not taking: Reported on 11/18/2020)    . Magnesium 500 MG TABS Take 500 mg by mouth daily. (Patient  not taking: Reported on 11/18/2020)    . OVER THE COUNTER MEDICATION Take 1 tablet by mouth daily. One a day vitamin Woman's one a day (Patient not taking: Reported on 11/18/2020)    . rosuvastatin (CRESTOR) 40 MG tablet TAKE 1 /2 TO 1 TABLET DAILY FOR CHOLESTEROL (Patient not taking: Reported on 11/18/2020) 90 tablet 1  . Tiotropium Bromide Monohydrate (SPIRIVA RESPIMAT) 2.5 MCG/ACT AERS Inhale 1 puff into the lungs daily. (Patient not taking: Reported on 11/18/2020) 4 g 11  . vitamin E 400 UNIT capsule Take 400 Units by mouth daily. (Patient not taking: Reported on 11/18/2020)    . aspirin EC 81 MG tablet Take 81 mg by mouth at bedtime.     . cefdinir (OMNICEF) 300 MG capsule Take 1 capsule (300 mg total) by mouth 2 (two) times daily. 14 capsule 0  . sulfamethoxazole-trimethoprim (BACTRIM DS) 800-160 MG tablet Take 1 tablet by mouth 2 (two) times daily. (Patient not taking: Reported on 10/16/2020) 14 tablet 0   No facility-administered medications prior to visit.    PAST MEDICAL HISTORY: Past Medical History:  Diagnosis Date  . Abnormal findings on esophagogastroduodenoscopy (EGD) 07/2010  . Aneurysm [facility]) 2013   Right Brain   . Aneurysm ([place])    in brain x 2  . Anxiety   . Arthritis   . Cataract   . Chronic kidney disease   . CKD (chronic kidney disease), stage IV ([place])   . Colon polyps   . Depression   . Diabetes mellitus 1998  . Diverticulosis   . Emphysema of lung ([place])   . ESOPHAGEAL STRICTURE 08/27/2008  . GERD (gastroesophageal reflux disease)   . Hemorrhoids   . Hiatal hernia   . Hyperlipidemia   . Hypertension 2000  . Migraines   . Peripheral vascular disease ([place])   . Phlebitis    30 years ago  left leg  . Stroke [facility])    multiple mini strokes ( brain aneurysm )    PAST SURGICAL HISTORY: Past Surgical History:  Procedure Laterality Date  . ABDOMINAL AORTAGRAM N/A 01/04/2012   Procedure: ABDOMINAL Maxcine Ham;  Surgeon: Serafina Mitchell, MD;  Location: [facility] CATH LAB;   Service: Cardiovascular;  Laterality: N/A;  . ABDOMINAL AORTAGRAM N/A 08/15/2012   Procedure: ABDOMINAL Maxcine Ham;  Surgeon: Serafina Mitchell, MD;  Location: [facility] CATH LAB;  Service: Cardiovascular;  Laterality: N/A;  . ABDOMINAL HYSTERECTOMY  1984  . cataract surgery Right 10-22-2015  . cataract surgery Left 11-05-2015  . CESAREAN SECTION    . CHOLECYSTECTOMY    . COLONOSCOPY  07/2010  . DENTAL SURGERY  Aug. 16, 2013   left lower   . ESOPHAGOGASTRODUODENOSCOPY  07/2010  . EYE SURGERY  Nov. 2014   Laser-Glaucoma  . FEMORAL ARTERY STENT  05/11/11  Left superficial femoral and popliteal artery  . HERNIA REPAIR     times two  . KNEE SURGERY    . LOWER EXTREMITY ANGIOGRAM Left 08/15/2012   Procedure: LOWER EXTREMITY ANGIOGRAM;  Surgeon: Serafina Mitchell, MD;  Location: [facility] CATH LAB;  Service: Cardiovascular;  Laterality: Left;  lt leg angio  . rotator cuff surgery    . THYROID SURGERY      FAMILY HISTORY: Family History  Problem Relation Age of Onset  . CAD Mother 47       Died of MI  . Hypertension Mother   . Heart attack Mother   . Heart disease Mother   . CAD Father 11       Died of MI  . Heart disease Father   . Heart attack Father   . CAD Brother 69       Two brothers died of MI  . Heart attack Brother   . Heart disease Brother        Amputation  . Diabetes Sister   . Hypertension Sister   . Heart attack Brother   . Heart disease Brother   . Stroke Sister   . Colon cancer Neg Hx   . Stomach cancer Neg Hx   . Esophageal cancer Neg Hx     SOCIAL HISTORY: Social History   Socioeconomic History  . Marital status: Married    Spouse name: Not on file  . Number of children: 2  . Years of education: Not on file  . Highest education level: Not on file  Occupational History  . 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 Smoking status: Light Tobacco Smoker    Packs/day: 0.50    Years: 40.00    Pack years: 20.00    Types: Cigarettes  .  Smokeless tobacco: Never Used  . Tobacco comment: 11/18/20 in process of quiting  Vaping Use  . Vaping Use: Never used  Substance and Sexual Activity  . Alcohol use: No    Alcohol/week: 0.0 standard drinks  . Drug use: No  . Sexual activity: Not on file  Other Topics Concern  . Not on file  Social History Narrative   Lives with husband.        Social Determinants of Health   Financial Resource Strain: Not on file  Food Insecurity: Not on file  Transportation Needs: Not on file  Physical Activity: Not on file  Stress: Not on file  Social Connections: Not on file  Intimate Partner Violence: Not on file     PHYSICAL EXAM  GENERAL EXAM/CONSTITUTIONAL: Vitals:  Vitals:   11/18/20 0840  BP: (!) 171/70  Pulse: 68  Weight: 189 lb (85.7 kg)  Height: _0  (1.676 m)     Body mass index is 30.51 kg/m. Wt Readings from Last 3 Encounters:  11/18/20 189 lb (85.7 kg)  10/28/20 186 lb (84.4 kg)  10/16/20 188 lb (85.3 kg)     Patient is in no distress; well developed, nourished and groomed; neck is supple  CARDIOVASCULAR:  Examination of carotid arteries is normal; no carotid bruits  Regular rate and rhythm, no murmurs  Examination of peripheral vascular system by observation and palpation is normal  EYES:  Ophthalmoscopic exam of optic discs and posterior segments is normal; no papilledema or hemorrhages  No exam data present  MUSCULOSKELETAL:  Gait, strength, tone, movements noted in Neurologic exam below  NEUROLOGIC: MENTAL STATUS:  No flowsheet data found.  awake,  alert, oriented to person, place and time  recent and remote memory intact  normal attention and concentration  language fluent, comprehension intact, naming intact  fund of knowledge appropriate  CRANIAL NERVE:   2nd - no papilledema on fundoscopic exam  2nd, 3rd, 4th, 6th - pupils equal and reactive to light, visual fields full to confrontation, extraocular muscles intact, no  nystagmus  5th - facial sensation symmetric  7th - facial strength symmetric  8th - hearing intact  9th - palate elevates symmetrically, uvula midline  11th - shoulder shrug symmetric  12th - tongue protrusion midline  MOTOR:   normal bulk and tone; DIFFUSE 4/5 STRENGTH  SENSORY:   normal and symmetric to light touch, temperature, vibration  COORDINATION:   finger-nose-finger, fine finger movements SLOW ON RIGHT UPPER EXT  REFLEXES:   deep tendon reflexes TRACE and symmetric  GAIT/STATION:   narrow based gait     DIAGNOSTIC DATA (LABS, IMAGING, TESTING) - I reviewed patient records, labs, notes, testing and imaging myself where available.  Lab Results  Component Value Date   WBC 12.5 (H) 10/28/2020   HGB 15.8 (H) 10/28/2020   HCT 47.7 (H) 10/28/2020   MCV 98.6 10/28/2020   PLT 231 10/28/2020      Component Value Date/Time   NA 138 10/28/2020 1346   K 4.1 10/28/2020 1346   CL 102 10/28/2020 1346   CO2 27 10/28/2020 1346   GLUCOSE 264 (H) 10/28/2020 1346   BUN 24 (H) 10/28/2020 1346   CREATININE 1.86 (H) 10/28/2020 1346   CREATININE 2.20 (H) 08/04/2020 1531   CALCIUM 9.0 10/28/2020 1346   PROT 7.0 10/28/2020 1346   ALBUMIN 3.5 10/28/2020 1346   AST 24 10/28/2020 1346   ALT 19 10/28/2020 1346   ALKPHOS 70 10/28/2020 1346   BILITOT 0.4 10/28/2020 1346   GFRNONAA 28 (L) 10/28/2020 1346   GFRNONAA 22 (L) 08/04/2020 1531   GFRAA 25 (L) 08/04/2020 1531   Lab Results  Component Value Date   CHOL 118 08/04/2020   HDL 40 (L) 08/04/2020   LDLCALC 55 08/04/2020   LDLDIRECT 146.4 01/30/2010   TRIG 152 (H) 08/04/2020   CHOLHDL 3.0 08/04/2020   Lab Results  Component Value Date   HGBA1C 8.7 (A) 09/22/2020   Lab Results  Component Value Date   VITAMINB12 410 08/04/2020   Lab Results  Component Value Date   TSH 1.58 08/04/2020    07/19/16 CTA head / neck 1. No evidence of acute intracranial abnormality. 2. Chronic right cerebellar  infarcts. 3. No large vessel occlusion. 4. 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 5. Unchanged intracranial and extracranial atherosclerosis including moderate to severe left P2 PCA stenosis, 50% left common carotid artery stenosis, and 30% bilateral proximal ICA stenoses.  01/25/17 MRA head - Moderate stenosis left posterior cerebral artery. No other significant intracranial stenosis. - Mild irregularity the anterior genu of the cavernous carotid on the right with mild bulging projecting inferiorly is unchanged from prior studies. This may represent atherosclerotic irregularity or a small 3 x 1 mm aneurysm, stable from the prior study.  01/25/17 MRI brain - 1 cm acute infarct left lateral thalamus and internal capsule - Chronic infarct right cerebellum  03/05/19 MRA head - Abnormal MRA of the brain showing moderate stenosis of the left posterior cerebral artery in its midsection.  There is mild irregularity of the right cavernous carotid with 2 to 3 mm irregular outpouching which may represent small aneurysm  .  10/03/20  BLE ABI  - Right: Resting right ankle-brachial index indicates severe right lower  extremity arterial disease. The right toe-brachial index is abnormal.  - Left: Resting left ankle-brachial index indicates severe left lower  extremity arterial disease. The left toe-brachial index is abnormal.   10/03/20 carotid u/s Right Carotid: Velocities in the right ICA are consistent with a 1-39%  stenosis.   Left Carotid: Velocities in the left ICA are consistent with a 1-39%  stenosis. There is hemodynamically significant plaque with >50%  stenosis in the left mid to distal CCA.   10/28/20 CT head  1. 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 2. Stable chronic ischemic changes right cerebellar hemisphere. 3. No acute hemorrhage.   ASSESSMENT AND PLAN  76 y.o. year old female here with unruptured brain aneurysm (vs  atherosclerosis), diabetes, hyperlipidemia, hypertension, migraines, peripheral vascular disease, with left thalamic stroke on 01/25/17.  Dx:  1. Occipital stroke (HCC)      PLAN:  LEFT PARIETO-OCCIPITAL INFARCT - check MRI brain to eval; questionable on last CT - continue aspirin 329m daily (intolerant of plavix in past) - continue BP, DM and lipid control per PCP, endocrinology, nephrology - smoking cessation reviewed  RIGHT CAVERNOUS CAROTID ATHEROSCLEROSIS (vs aneurysm) - stable on last MRA from 2020 going back to 2014 - continue medical mgmt  Return for pending if symptoms worsen or fail to improve.  I spent 40 minutes of face-to-face and non-face-to-face time with patient.  This included previsit chart review, lab review, study review, order entry, electronic health record documentation, patient education.      VPenni Bombard MD 34/11/5684 81:68AM Certified in Neurology, Neurophysiology and Neuroimaging  [facility] [address] SLowndesboroGTrevose [place] [phone]

## 2020-11-22 ENCOUNTER — Ambulatory Visit
Admission: RE | Admit: 2020-11-22 | Discharge: 2020-11-22 | Disposition: A | Discharge status: Home / Self Care | Payer: Medicare Other | Source: Ambulatory Visit | Attending: Diagnostic Neuroimaging | Admitting: Diagnostic Neuroimaging

## 2020-11-22 ENCOUNTER — Other Ambulatory Visit: Payer: Self-pay

## 2020-11-22 DIAGNOSIS — I639 Cerebral infarction, unspecified: Secondary | ICD-10-CM | POA: Diagnosis not present

## 2020-11-28 ENCOUNTER — Encounter (HOSPITAL_COMMUNITY): Payer: Medicare Other

## 2020-12-01 ENCOUNTER — Ambulatory Visit (INDEPENDENT_AMBULATORY_CARE_PROVIDER_SITE_OTHER): Payer: Medicare Other | Admitting: Surgery

## 2020-12-01 ENCOUNTER — Other Ambulatory Visit: Payer: Self-pay

## 2020-12-01 ENCOUNTER — Encounter: Payer: Self-pay | Admitting: Surgery

## 2020-12-01 VITALS — BP 151/70 | HR 68 | Temp 98.3°F | Resp 20 | Ht 66.0 in | Wt 190.0 lb

## 2020-12-01 DIAGNOSIS — I70213 Atherosclerosis of native arteries of extremities with intermittent claudication, bilateral legs: Secondary | ICD-10-CM

## 2020-12-01 DIAGNOSIS — I779 Disorder of arteries and arterioles, unspecified: Secondary | ICD-10-CM

## 2020-12-01 DIAGNOSIS — I6523 Occlusion and stenosis of bilateral carotid arteries: Secondary | ICD-10-CM | POA: Diagnosis not present

## 2020-12-01 NOTE — Progress Notes (Signed)
[facility] [place]  Patient name: [name] MRN: [id number] DOB: [birth year] Sex: female   REASON FOR VISIT:    Follow up  [place] ILLNESS:    [name] is a 75 y.o. female who is back today for follow-up.  She has a history of claudication.  She has undergone stenting of her left superficial femoral artery in 2012.  She developed early recurrent in-stent stenosis, treated with angioplasty.  Her stent is known to be occluded.  She has bilateral claudication however in the past these symptoms have been tolerable.  She has tried to quit smoking.  She was originally scheduled for an appointment 1 month ago however this was canceled due to the weather.  She is following up   When she called, she had pain in her ankle.  This has completely resolved.  Patient is also followed for her carotid stenosis.  She denies any signs or symptoms of neurologic deficits within the past year.  Patient is medically managed for her diabetes.  This remains difficult to control.  She takes a statin for hypercholesterolemia.  She is medically managed for hypertension with an ARB.   PAST MEDICAL HISTORY:   Past Medical History:  Diagnosis Date  . Abnormal findings on esophagogastroduodenoscopy (EGD) 07/2010  . Aneurysm [facility]) 2013   Right Brain   . 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 Anxiety   . Arthritis   . Cataract   . Chronic kidney disease   . CKD (chronic kidney disease), stage IV ([place])   . Colon polyps   . Depression   . Diabetes mellitus 1998  . Diverticulosis   . Emphysema of lung ([place])   . ESOPHAGEAL STRICTURE 08/27/2008  . GERD (gastroesophageal reflux disease)   . Hemorrhoids   . Hiatal hernia   . Hyperlipidemia   . Hypertension 2000  . Migraines   . Peripheral vascular disease ([place])   . Phlebitis    30 years ago  left leg  . Stroke [facility])    multiple mini strokes ( brain aneurysm )     FAMILY HISTORY:    Family History  Problem Relation Age of Onset  . CAD Mother 38       Died of MI  . Hypertension Mother   . Heart attack Mother   . Heart disease Mother   . CAD Father 38       Died of MI  . Heart disease Father   . Heart attack Father   . CAD Brother 11       Two brothers died of MI  . Heart attack Brother   . Heart disease Brother        Amputation  . Diabetes Sister   . Hypertension Sister   . Heart attack Brother   . Heart disease Brother   . Stroke Sister   . Colon cancer Neg Hx   . Stomach cancer Neg Hx   . Esophageal cancer Neg Hx     SOCIAL HISTORY:   Social History   Tobacco Use  . Smoking status: Light Tobacco Smoker    Packs/day: 0.50    Years: 40.00    Pack years: 20.00    Types: Cigarettes  . Smokeless tobacco: Never Used  . Tobacco comment: 11/18/20 in process of quiting  Substance Use Topics  . Alcohol use: No    Alcohol/week: 0.0 standard drinks     ALLERGIES:  Allergies  Allergen Reactions  . Ciprofloxacin Other (See Comments)  . Ozempic (0.25 Or 0.5 Mg-Dose) [Semaglutide(0.25 Or 0.5mg -Dos)] Nausea And Vomiting  . Plavix [Clopidogrel Bisulfate] Palpitations  . Amoxicillin Itching and Swelling    FACE & EYES SWELL  . Ace Inhibitors   . Ciprofloxacin Hcl Swelling  . Lyrica [Pregabalin]     Itch, gain weight  . Penicillins Other (See Comments)    REACTION: unspecified     CURRENT MEDICATIONS:   Current Outpatient Medications  Medication Sig Dispense Refill  . amLODipine (NORVASC) 10 MG tablet TAKE 1 TABLET DAILY FOR BLOOD PRESSURE 90 tablet 3  . aspirin 325 MG EC tablet Take 325 mg by mouth daily.    Marland Kitchen b complex vitamins capsule Take 1 capsule by mouth daily.    . Blood Glucose Monitoring Suppl (ACCU-CHEK AVIVA PLUS) W/DEVICE KIT Check blood sugar up to three times daily 1 kit 0  . chlorthalidone (HYGROTON) 25 MG tablet Take 12.5 mg by mouth daily.    Marland Kitchen CINNAMON PO Take 1,000 mg by mouth 2 (two) times daily.     . citalopram  (CELEXA) 40 MG tablet TAKE 1 TABLET DAILY FOR MOOD & ANXIETY 90 tablet 3  . clonazePAM (KLONOPIN) 0.5 MG tablet TAKE 1/2 - 1 TABLET AT BEDTIME ONLY IF NEEDED FORSLEEP & LIMIT TO 5 DAYS /WEEK TO AVOID ADDICTION & DEMENTIA. SCRIPT SHOULD LAST LONGER THAN 30 DAYS. 30 tablet 0  . FARXIGA 10 MG TABS tablet TAKE 10 MG BY MOUTH DAILY BEFORE BREAKFAST. 90 tablet 3  . glucose blood (ACCU-CHEK AVIVA PLUS) test strip Use daily to check BS TID Dx. E11.22 300 each 1  . insulin aspart (NOVOLOG FLEXPEN) 100 UNIT/ML FlexPen Inject 6-8 Units into the skin 3 (three) times daily with meals. (Patient taking differently: Inject 14 Units into the skin 3 (three) times daily with meals.) 15 mL 11  . Insulin Pen Needle 29G X 5MM MISC Use with insulin pen 3 times a day. 300 each 11  . Magnesium 500 MG TABS Take 500 mg by mouth daily.    Marland Kitchen olmesartan (BENICAR) 40 MG tablet TAKE 1 TABLET DAILY FOR BLOOD PRESSURE 90 tablet 3  . Omega-3 Fatty Acids (FISH OIL) 1200 MG CAPS Take 1,200 capsules by mouth daily.     Marland Kitchen omeprazole (PRILOSEC) 40 MG capsule Take 1 capsule (40 mg total) by mouth in the morning and at bedtime. 180 capsule 3  . OVER THE COUNTER MEDICATION Take 1 tablet by mouth daily. One a day vitamin Woman's one a day    . rosuvastatin (CRESTOR) 40 MG tablet TAKE 1 /2 TO 1 TABLET DAILY FOR CHOLESTEROL 90 tablet 1  . Tiotropium Bromide Monohydrate (SPIRIVA RESPIMAT) 2.5 MCG/ACT AERS Inhale 1 puff into the lungs daily. 4 g 11  . TRESIBA FLEXTOUCH 200 UNIT/ML FlexTouch Pen INJECT 34 UNITS DAILY UNDER SKIN 9 mL 2  . vitamin E 400 UNIT capsule Take 400 Units by mouth daily.    Marland Kitchen albuterol (VENTOLIN HFA) 108 (90 Base) MCG/ACT inhaler Inhale 2 puffs into the lungs every 4 (four) hours as needed for wheezing or shortness of breath. (Patient not taking: No sig reported) 18 g 11  . Cholecalciferol (VITAMIN D PO) Take 5,000 Units by mouth daily. (Patient not taking: No sig reported)     No current facility-administered medications  for this visit.    REVIEW OF SYSTEMS:   [X]  denotes positive finding, [ ]  denotes negative finding Cardiac  Comments:  Chest  pain or chest pressure:    Shortness of breath upon exertion:    Short of breath when lying flat:    Irregular heart rhythm:        Vascular    Pain in calf, thigh, or hip brought on by ambulation: x   Pain in feet at night that wakes you up from your sleep:     Blood clot in your veins:    Leg swelling:         Pulmonary    Oxygen at home:    Productive cough:     Wheezing:         Neurologic    Sudden weakness in arms or legs:     Sudden numbness in arms or legs:     Sudden onset of difficulty speaking or slurred speech:    Temporary loss of vision in one eye:     Problems with dizziness:         Gastrointestinal    Blood in stool:     Vomited blood:         Genitourinary    Burning when urinating:     Blood in urine:        Psychiatric    Major depression:         Hematologic    Bleeding problems:    Problems with blood clotting too easily:        Skin    Rashes or ulcers:        Constitutional    Fever or chills:      PHYSICAL EXAM:   Vitals:   12/01/20 1018  BP: (!) 151/70  Pulse: 68  Resp: 20  Temp: 98.3 F (36.8 C)  SpO2: 93%  Weight: 190 lb (86.2 kg)  Height: $Remove'5\' 6"'BDUCszk$  (1.676 m)    GENERAL: The patient is a well-nourished female, in no acute distress. The vital signs are documented above. CARDIAC: There is a regular rate and rhythm.  VASCULAR: Nonpalpable pedal pulses PULMONARY: Non-labored respirations MUSCULOSKELETAL: There are no major deformities or cyanosis. NEUROLOGIC: No focal weakness or paresthesias are detected. SKIN: There are no ulcers or rashes noted. PSYCHIATRIC: The patient has a normal affect.  STUDIES:   I have reviewed the following studies: Carotid: Right Carotid: Velocities in the right ICA are consistent with a 1-39%  stenosis.   Left Carotid: Velocities in the left ICA are consistent  with a 1-39%  stenosis.        There is hemodynamically significant plaque with >50%  stenosis in        the left mid to distal CCA.   Vertebrals: Bilateral vertebral arteries demonstrate antegrade flow.   +-------+-----------+-----------+------------+------------+  ABI/TBIToday's ABIToday's TBIPrevious ABIPrevious TBI  +-------+-----------+-----------+------------+------------+  Right 0.45    0.19    0.53    0.37      +-------+-----------+-----------+------------+------------+  Left  0.46    0.29    0.56    0.44      +-------+-----------+-----------+------------+------------+   MEDICAL ISSUES:   Carotid: The patient denies any new symptoms.  She will have her duplex repeated in 1 year  PAD: The patient suffers from bilateral claudication, however she is able to tolerate her level of disability.  She knows to contact me should she develop a nonhealing wound.  Otherwise, she will continue her current medication regimen and follow-up in 1 year with ABIs.    Leia Alf, MD, FACS [facility] [phone] Pager [phone] 

## 2020-12-08 ENCOUNTER — Other Ambulatory Visit: Payer: Self-pay | Admitting: Adult Health Nurse Practitioner

## 2020-12-08 DIAGNOSIS — F411 Generalized anxiety disorder: Secondary | ICD-10-CM

## 2020-12-15 ENCOUNTER — Telehealth: Payer: Self-pay

## 2020-12-15 ENCOUNTER — Encounter: Payer: Self-pay | Admitting: Adult Health Nurse Practitioner

## 2020-12-15 ENCOUNTER — Ambulatory Visit (INDEPENDENT_AMBULATORY_CARE_PROVIDER_SITE_OTHER): Payer: Medicare Other | Admitting: Adult Health Nurse Practitioner

## 2020-12-15 ENCOUNTER — Other Ambulatory Visit: Payer: Self-pay

## 2020-12-15 VITALS — BP 148/88 | HR 69 | Temp 97.3°F | Ht 66.0 in | Wt 189.0 lb

## 2020-12-15 DIAGNOSIS — Z794 Long term (current) use of insulin: Secondary | ICD-10-CM | POA: Diagnosis not present

## 2020-12-15 DIAGNOSIS — J439 Emphysema, unspecified: Secondary | ICD-10-CM

## 2020-12-15 DIAGNOSIS — E1351 Other specified diabetes mellitus with diabetic peripheral angiopathy without gangrene: Secondary | ICD-10-CM | POA: Diagnosis not present

## 2020-12-15 DIAGNOSIS — E1122 Type 2 diabetes mellitus with diabetic chronic kidney disease: Secondary | ICD-10-CM | POA: Diagnosis not present

## 2020-12-15 DIAGNOSIS — E785 Hyperlipidemia, unspecified: Secondary | ICD-10-CM | POA: Diagnosis not present

## 2020-12-15 DIAGNOSIS — R6889 Other general symptoms and signs: Secondary | ICD-10-CM

## 2020-12-15 DIAGNOSIS — E042 Nontoxic multinodular goiter: Secondary | ICD-10-CM

## 2020-12-15 DIAGNOSIS — E1340 Other specified diabetes mellitus with diabetic neuropathy, unspecified: Secondary | ICD-10-CM

## 2020-12-15 DIAGNOSIS — M25561 Pain in right knee: Secondary | ICD-10-CM

## 2020-12-15 DIAGNOSIS — I739 Peripheral vascular disease, unspecified: Secondary | ICD-10-CM

## 2020-12-15 DIAGNOSIS — G43109 Migraine with aura, not intractable, without status migrainosus: Secondary | ICD-10-CM

## 2020-12-15 DIAGNOSIS — I639 Cerebral infarction, unspecified: Secondary | ICD-10-CM

## 2020-12-15 DIAGNOSIS — F3341 Major depressive disorder, recurrent, in partial remission: Secondary | ICD-10-CM | POA: Diagnosis not present

## 2020-12-15 DIAGNOSIS — R911 Solitary pulmonary nodule: Secondary | ICD-10-CM | POA: Diagnosis not present

## 2020-12-15 DIAGNOSIS — I700 Atherosclerosis of aorta: Secondary | ICD-10-CM | POA: Diagnosis not present

## 2020-12-15 DIAGNOSIS — R0602 Shortness of breath: Secondary | ICD-10-CM

## 2020-12-15 DIAGNOSIS — Z720 Tobacco use: Secondary | ICD-10-CM

## 2020-12-15 DIAGNOSIS — N183 Chronic kidney disease, stage 3 unspecified: Secondary | ICD-10-CM

## 2020-12-15 DIAGNOSIS — I10 Essential (primary) hypertension: Secondary | ICD-10-CM

## 2020-12-15 DIAGNOSIS — Z8673 Personal history of transient ischemic attack (TIA), and cerebral infarction without residual deficits: Secondary | ICD-10-CM

## 2020-12-15 DIAGNOSIS — E1169 Type 2 diabetes mellitus with other specified complication: Secondary | ICD-10-CM

## 2020-12-15 DIAGNOSIS — N184 Chronic kidney disease, stage 4 (severe): Secondary | ICD-10-CM | POA: Diagnosis not present

## 2020-12-15 DIAGNOSIS — I517 Cardiomegaly: Secondary | ICD-10-CM

## 2020-12-15 DIAGNOSIS — R300 Dysuria: Secondary | ICD-10-CM

## 2020-12-15 DIAGNOSIS — Z0001 Encounter for general adult medical examination with abnormal findings: Secondary | ICD-10-CM

## 2020-12-15 DIAGNOSIS — Z79899 Other long term (current) drug therapy: Secondary | ICD-10-CM | POA: Diagnosis not present

## 2020-12-15 DIAGNOSIS — I671 Cerebral aneurysm, nonruptured: Secondary | ICD-10-CM

## 2020-12-15 DIAGNOSIS — I779 Disorder of arteries and arterioles, unspecified: Secondary | ICD-10-CM

## 2020-12-15 DIAGNOSIS — M25562 Pain in left knee: Secondary | ICD-10-CM

## 2020-12-15 DIAGNOSIS — Z0000 Encounter for general adult medical examination without abnormal findings: Secondary | ICD-10-CM

## 2020-12-15 MED ORDER — TRESIBA FLEXTOUCH 200 UNIT/ML ~~LOC~~ SOPN: PEN_INJECTOR | SUBCUTANEOUS | 2 refills | Status: DC

## 2020-12-15 NOTE — Telephone Encounter (Signed)
Penni Bombard, MD  12/11/2020 5:01 PM EDT Back to Top     Chronic strokes noted. Continue medical mgmt. Also noted incidentally is cyst in posterior pharynx (back of throat). Recommend ENT consult for eval. -VRP   I called pt and advised of result. Pt verbalized understanding ENT referral placed for work up.

## 2020-12-15 NOTE — Progress Notes (Signed)
Medicare Annual Wellness  Assessment:   Encounter for Medicare annual wellness exam Yearly  Atherosclerosis of aorta ([place]) Control blood pressure, cholesterol, glucose, increase exercise.   PAOD (peripheral arterial occlusive disease) ([place]) Continue follow up Suggest trying to increase walking  Peripheral vascular disease due to secondary diabetes mellitus ([place]) Continue to control sugars, lipids, B/P, stop smoking Carotid duplex yearly ABI's yearly Follows with Dr Trula Slade  PVD (peripheral vascular disease) ([place]) Control blood pressure, cholesterol, glucose, increase exercise.   Occipital Stroke MRI 11/22/20 Follows with Neurology Recent referral ENT, imaging  Type 2 diabetes mellitus with other circulatory complication, with long-term current use of insulin ([place]) Discussed general issues about diabetes pathophysiology and management., Educational material distributed., Suggested low cholesterol diet., Encouraged aerobic exercise., Discussed foot care., Reminded to get yearly retinal exam.  Pulmonary emphysema, unspecified emphysema type ([place]) Try to get back on the spiriva  CKD stage 3 due to type 2 diabetes mellitus ([place]) Monitor kidney function Follows with [facility]  Neuropathy due to secondary diabetes ([place]) Monitor feet, follow back up with podiatry Foot Exam today Sensory impairment, worse on right  Depression, major, recurrent, in partial remission ([place]) Continue medications: citalopram 7m daily Discussed stress management techniques  Discussed, increase water,intake & good sleep hygiene  Discussed increasing exercise & vegetables in diet   Hyperlipidemia associated with type 2 diabetes mellitus ([place] At goal of less than 70  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[place] Continue to follow with eye doctor yearly  Atherosclerosis of native artery of extremity with intermittent claudication, unspecified extremity ([place] Follows with vascular  Essential  hypertension Continue current medications: Olmesartan 472m amlodapine 1058mchlorthalidon 5m16mery other day, for now likely increase, nephrology managing this. Monitor blood pressure at home; call if consistently over 130/80 Continue DASH diet.   Reminder to go to the ER if any CP, SOB, nausea, dizziness, severe HA, changes vision/speech, left arm numbness and tingling and jaw pain.   Lung nodule Continue follow up cardiothoracic surgery Follow up CT in [id number] OV 10/16/20  Medication management Continued  Tobacco abuse Continue to try to cut back/stop  Vitamin D deficiency Continue supplement  SOB (shortness of breath) Try the spiriva again with spacer  History of CVA (cerebrovascular accident) Control blood pressure, cholesterol, glucose, increase exercise.   Nontoxic multinodular goiter Monitor  Brain aneurysm  Cardiomegaly Monitor  Coronary artery calcification Follow up cardio  Migraine with aura and without status migrainosus, not intractable Monitor  Acute pain of both knees -     Ambulatory referral to Orthopedic Surgery - it is affecting her being able to walk, she does not want surgery    Further disposition pending results if labs check today. Discussed med's effects and SE's.   Over 30 minutes of face to face interview, exam, counseling, chart review, and critical decision making was performed.      Future Appointments  Date Time Provider DeparAirmont/2022 10:40 AM GhergPhilemon KingdomLBPC-LBENDO None  08/04/2021  2:00 PM CorbeLiane ComberGAAM-GAAIM None  12/15/2021 10:30 AM CorbeLiane ComberGAAM-GAAIM None  ' Plan:   During the course of the visit the patient was educated and counseled about appropriate screening and preventive services including:    Pneumococcal vaccine   Influenza vaccine  Td vaccine  Screening electrocardiogram  Screening mammography  Screening Pap smear and pelvic exam   Bone  densitometry screening  Colorectal cancer screening  Diabetes screening  Glaucoma screening  Nutrition counseling   Smoking  cessation counseling   Subjective:   [name] is a 75 y.o. female who presents for Medicare Annual Wellness Visit and 3 month follow up on hypertension, diabetes, hyperlipidemia, vitamin D def.   She had an ED visit 10/28/20 with complaints of dull headache of moderate, generalized, blurry vision, changes 1-2 months. Lymphadnopathy preauricular, subauricular, severe.  MRI on 11/22/20.  She has follow up with him after the ENT follow up. IMPRESSION: This MRI of the brain without contrast shows the following: 1.   Chronic right cerebellar hemisphere stroke, unchanged in appearance compared to the CT from 10/28/2020 and the MRI from 01/25/2017. 2.   Left parieto-occipital stroke, unchanged in appearance compared to the 10/28/2020 CT scan but not noted in 2018. 3.   Small lacunar infarction in the left thalamus, present on the CT from 10/28/2020 but not noted on the MRI from 2018 4.   Scattered T2/FLAIR hyperintense foci in the hemispheres consistent with mild chronic microvascular ischemic change, progressed compared to the MRI from 2018 5.   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  As it has enlarged, recommend endoscopic direct visualization.  She is going to have a referral with ENT related to cystic structure on nasopharynyx that increased in size since 2018. Imaging also discussed.   She follows with Kidney MD, Dr Candiss Norse.  Recommended that he recommended meeting with folks at the dialysis center.  Reports she is managing other health issues at this time.    Her blood pressure has been controlled at home, today their BP is BP: (!) 148/88   She does workout rides her bike at home. She has SOB.  She is really unable to exercise significantly due to pain in her legs, she is rather inactive. She is very concerned  with her ADLs and being able to keep her independence/quality of life.  She has lower back pain, pain bilateral legs with walking and standing.    She has COPD, has lung nodules, last one was CT chest (11/2019) - following with Dr. Servando Snare 10/16/20, will have follow up CT in 6 months. She states the spiriva made her throat very sore after 1 week so she has not used it, she has albuterol as needed  She recently had an appointment with vein & vascular, Dr Nigel Bridgeman reports no bypass interventions at this time.  Encouraged exercise to encourage increased blood flow.  Follow up ABI's and carotid dopplers in 1year.  She is due for follow up with foot doctor related to fungus on her nails.   Follows with Dr. Herbert Deaner for diabetic eye exam yearly.     She has several comorbidities from her DM including  Neuropathy Retinopathy Dr. Kathlen Mody 11/26/2019- follow up 6 months Hyperlipidemia at goal less than 70, on crestor 40 mg CKD she is on ARB (off metformin due to CKDIV)  history of TIA she is on 325 ASA PAD s/p stents  Tresiba 40units Could not tolerate invokana- she is on farxiga and tolerating it well she been working on diet and exercise for diabetes and following with Dr. Darnell Level  denies polydipsia, polyuria and visual disturbances.   Last A1C in the office was:  Sh is following with Dr Renne Crigler and last A1c was 6.25% Next OV is scheduled for 01/2021.  Lab Results  Component Value Date   HGBA1C 8.7 (A) 09/22/2020    Lab Results  Component Value Date   CHOL 118 08/04/2020  HDL 40 (L) 08/04/2020   LDLCALC 55 08/04/2020   LDLDIRECT 146.4 01/30/2010   TRIG 152 (H) 08/04/2020   CHOLHDL 3.0 08/04/2020   Lab Results  Component Value Date   GFRAA 25 (L) 08/04/2020    Patient is on Vitamin D. Lab Results  Component Value Date   VD25OH 21 (L) 08/04/2020     Names of Other Physician/Practitioners you currently use: 1. [facility]- here for primary care 2.  Dr. Herbert Deaner, DIABETIC eye doctor, last visit 11/2019 Retinopathy, scheduled for 2022  Patient Care Team: [name], MD as PCP - General (Internal Medicine) Minus Breeding, MD as PCP - Cardiology (Cardiology) Michel Santee, MD as Consulting Physician (Neurology) Serafina Mitchell, MD as Consulting Physician (Vascular Surgery) Irene Shipper, MD as Consulting Physician (Gastroenterology) Gardiner Barefoot, DPM as Consulting Physician (Podiatry) Hortencia Pilar, MD as Consulting Physician (Surgery) Grace Isaac, MD as Consulting Physician (Cardiothoracic Surgery)  Medication Review  Current Outpatient Medications (Endocrine & Metabolic):  Marland Kitchen  FARXIGA 10 MG TABS tablet, TAKE 10 MG BY MOUTH DAILY BEFORE BREAKFAST. Marland Kitchen  insulin aspart (NOVOLOG FLEXPEN) 100 UNIT/ML FlexPen, Inject 6-8 Units into the skin 3 (three) times daily with meals. (Patient taking differently: Inject 14 Units into the skin 3 (three) times daily with meals.) .  insulin degludec (TRESIBA FLEXTOUCH) 200 UNIT/ML FlexTouch Pen, Inject 40units daily under skin.  Current Outpatient Medications (Cardiovascular):  .  amLODipine (NORVASC) 10 MG tablet, TAKE 1 TABLET DAILY FOR BLOOD PRESSURE .  chlorthalidone (HYGROTON) 25 MG tablet, Take 12.5 mg by mouth daily. Marland Kitchen  olmesartan (BENICAR) 40 MG tablet, TAKE 1 TABLET DAILY FOR BLOOD PRESSURE .  rosuvastatin (CRESTOR) 40 MG tablet, TAKE 1 /2 TO 1 TABLET DAILY FOR CHOLESTEROL  Current Outpatient Medications (Respiratory):  Marland Kitchen  Tiotropium Bromide Monohydrate (SPIRIVA RESPIMAT) 2.5 MCG/ACT AERS, Inhale 1 puff into the lungs daily. Marland Kitchen  albuterol (VENTOLIN HFA) 108 (90 Base) MCG/ACT inhaler, Inhale 2 puffs into the lungs every 4 (four) hours as needed for wheezing or shortness of breath. (Patient not taking: Reported on 12/15/2020)  Current Outpatient Medications (Analgesics):  .  aspirin 325 MG EC tablet, Take 325 mg by mouth daily.   Current Outpatient Medications (Other):  .  b  complex vitamins capsule, Take 1 capsule by mouth daily. .  Blood Glucose Monitoring Suppl (ACCU-CHEK AVIVA PLUS) W/DEVICE KIT, Check blood sugar up to three times daily .  Cholecalciferol (VITAMIN D PO), Take 5,000 Units by mouth daily. Marland Kitchen  CINNAMON PO, Take 1,000 mg by mouth 2 (two) times daily.  .  citalopram (CELEXA) 40 MG tablet, TAKE 1 TABLET DAILY FOR MOOD & ANXIETY .  clonazePAM (KLONOPIN) 0.5 MG tablet, TAKE 1/2 - 1 TABLET AT BEDTIME ONLY IF NEEDED FORSLEEP & LIMIT TO 5 DAYS /WEEK TO AVOID ADDICTION & DEMENTIA. SCRIPT SHOULD LAST LONGER THAN 30 DAYS .  glucose blood (ACCU-CHEK AVIVA PLUS) test strip, Use daily to check BS TID Dx. E11.22 .  Insulin Pen Needle 29G X 5MM MISC, Use with insulin pen 3 times a day. .  Magnesium 500 MG TABS, Take 500 mg by mouth daily. .  Omega-3 Fatty Acids (FISH OIL) 1200 MG CAPS, Take 1,200 capsules by mouth daily.  Marland Kitchen  omeprazole (PRILOSEC) 40 MG capsule, Take 1 capsule (40 mg total) by mouth in the morning and at bedtime. Marland Kitchen  OVER THE COUNTER MEDICATION, Take 1 tablet by mouth daily. One a day vitamin Woman's one a day .  vitamin E 400 UNIT capsule, Take 400 Units by mouth daily.  Current Problems (verified) Patient Active Problem List   Diagnosis Date Noted  . Carotid artery stenosis 12/03/2019  . Migraine with aura and without status migrainosus, not intractable 11/28/2019  . Hyperlipidemia associated with type 2 diabetes mellitus ([place]) 11/10/2019  . Coronary artery calcification 08/05/2019  . PAOD (peripheral arterial occlusive disease) ([place]) 02/26/2019  . SOB (shortness of breath) 11/17/2018  . Cardiomegaly 11/17/2018  . Emphysema of lung ([place]) 10/10/2017  . Atherosclerosis of aorta ([place]) 10/10/2017  . Lung nodule 10/10/2017  . Anxiety 08/22/2017  . Arthritis 08/22/2017  . Neuropathy due to secondary diabetes ([place]) 08/22/2017  . PVD (peripheral vascular disease) ([place]) 04/13/2017  . Type 2 diabetes mellitus with circulatory disorder, with  long-term current use of insulin ([place]) 11/07/2015  . Encounter for Medicare annual wellness exam 06/24/2015  . Generalized anxiety disorder 03/26/2015  . Depression, major, recurrent, in partial remission ([place]) 03/26/2015  . History of CVA (cerebrovascular accident) 10/04/2014  . Tobacco abuse 10/04/2014  . Vitamin D deficiency 11/02/2013  . Medication management 11/02/2013  . Peripheral vascular disease due to secondary diabetes mellitus ([place]) 05/08/2012  . Atherosclerosis of native arteries of extremity with intermittent claudication 12/24/2011  . Diverticulosis of large intestine 06/09/2010  . History of colonic polyps 06/09/2010  . Esophageal reflux 08/27/2008  . Nontoxic multinodular goiter 03/11/2008  . 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[place]) 03/11/2008  . Brain aneurysm 03/11/2008  . CKD stage 3 due to type 2 diabetes mellitus ([place]) 05/01/2007  . Essential hypertension 05/01/2007    Screening Tests Immunization History  Administered Date(s) Administered  . DT (Pediatric) 03/26/2015  . Fluad Quad(high Dose 65+) 07/09/2019  . Influenza Whole 08/02/2007, 06/18/2009, 07/09/2013  . Influenza, High Dose Seasonal PF 08/05/2015, 07/22/2016, 07/04/2017, 06/30/2020  . PFIZER(Purple Top)SARS-COV-2 Vaccination 06/30/2020  . Pneumococcal Conjugate-13 07/22/2016  . Pneumococcal Polysaccharide-23 02/08/2014  . Td 09/21/2004  . Unspecified SARS-COV-2 Vaccination 10/13/2019, 11/03/2019   Health Maintenance  Topic Date Due  . FOOT EXAM  04/19/2019  . HEMOGLOBIN A1C  03/22/2021  . OPHTHALMOLOGY EXAM  09/01/2021  . MAMMOGRAM  05/07/2022  . TETANUS/TDAP  03/25/2025  . COLONOSCOPY (Pts 45-44yr Insurance coverage will need to be confirmed)  08/26/2030  . INFLUENZA VACCINE  Completed  . DEXA SCAN  Completed  . COVID-19 Vaccine  Completed  . Hepatitis C Screening  Completed  . PNA vac Low Risk Adult  Completed  . HPV VACCINES  Aged Out    Preventative care: Last colonoscopy: 08/2020  Dr. PHenrene Pastor Screening 5 years Last mammogram: 04/2020 has scheduled Last pap smear/pelvic exam: 2007  DEXA: 2016 OVERDUE Echo 01/2017 EF 60-65% ABI 2014 MRI/MRA  brain 05.2018 IMPRESSION: 1 cm acute infarct left lateral thalamus and internal capsule Chronic infarct right cerebellum Moderate stenosis left posterior cerebral artery. No other significant intracranial stenosis.  CT chest WO contrast 11/2019  Prior vaccinations: TD or Tdap: 2016  Influenza: 20203 Pneumococcal: 2015 Prevnar 13:2017 Shingles/Zostavax: Check price  Allergies Allergies  Allergen Reactions  . Ciprofloxacin Other (See Comments)  . Ozempic (0.25 Or 0.5 Mg-Dose) [Semaglutide(0.25 Or 0.542mDos)] Nausea And Vomiting  . Plavix [Clopidogrel Bisulfate] Palpitations  . Amoxicillin Itching and Swelling    FACE & EYES SWELL  . Ace Inhibitors   . Ciprofloxacin Hcl Swelling  . Lyrica [Pregabalin]     Itch, gain weight  . Penicillins Other (See Comments)    REACTION: unspecified    SURGICAL HISTORY She  has  a past surgical history that includes Cesarean section; Cholecystectomy; Knee surgery; rotator cuff surgery; Thyroid surgery; Hernia repair; Abdominal hysterectomy (1984); Femoral artery stent (05/11/11); Dental surgery (Aug. 16, 2013); Eye surgery (Nov. 2014); Esophagogastroduodenoscopy (07/2010); Colonoscopy (07/2010); abdominal aortagram (N/A, 01/04/2012); abdominal aortagram (N/A, 08/15/2012); lower extremity angiogram (Left, 08/15/2012); cataract surgery (Right, 10-22-2015); and cataract surgery (Left, 11-05-2015). FAMILY HISTORY Her family history includes CAD (age of onset: 56) in her brother; CAD (age of onset: 12) in her father and mother; Diabetes in her sister; Heart attack in her brother, brother, father, and mother; Heart disease in her brother, brother, father, and mother; Hypertension in her mother and sister; Stroke in her sister. SOCIAL HISTORY She  reports that she has been smoking cigarettes.  She has a 20.00 pack-year smoking history. She has never used smokeless tobacco. She reports that she does not drink alcohol and does not use drugs.  MEDICARE WELLNESS OBJECTIVES: Physical activity: Current Exercise Habits: The patient does not participate in regular exercise at present Cardiac risk factors: Cardiac Risk Factors include: advanced age (>65mn, >>50women);diabetes mellitus;dyslipidemia;family history of premature cardiovascular disease;sedentary lifestyle;smoking/ tobacco exposure;hypertension Depression/mood screen:   [facility] 12/15/[facility]  Decreased Interest 0  Down, Depressed, Hopeless 0  PHQ - 2 Score 0  Some recent data might be hidden    ADLs:  In your present state of health, do you have any difficulty performing the following activities: 12/15/2020  Hearing? N  Vision? N  Difficulty concentrating or making decisions? N  Walking or climbing stairs? N  Dressing or bathing? N  Doing errands, shopping? N  Preparing Food and eating ? N  Using the Toilet? N  In the past six months, have you accidently leaked urine? N  Do you have problems with loss of bowel control? N  Managing your Medications? N  Managing your Finances? N  Housekeeping or managing your Housekeeping? N  Some recent data might be hidden     Cognitive Testing  Alert? Yes  Normal Appearance?Yes  Oriented to person? Yes  Place? Yes   Time? Yes  Recall of three objects?  Yes  Can perform simple calculations? Yes  Displays appropriate judgment?Yes  Can read the correct time from a watch face?Yes  EOL planning: Does Patient Have a Medical Advance Directive?: No Yes  Objective:   Blood pressure (!) 148/88, pulse 69, temperature (!) 97.3 F (36.3 C), height _0  (1.676 m), weight 189 lb (85.7 kg), SpO2 92 %. Body mass index is 30.51 kg/m.  General appearance: alert, no distress, WD/WN,  female HEENT: normocephalic, sclerae anicteric, TMs pearly, nares patent, no discharge or  erythema, pharynx normal. Oral cavity: MMM, no lesions Neck: supple, no lymphadenopathy, no thyromegaly, no masses Heart: RRR, normal S1, S2, no murmurs Lungs: CTA bilaterally, no wheezes, rhonchi, or rales Abdomen: +bs, soft, non tender, non distended, no masses, no hepatomegaly, no splenomegaly Musculoskeletal: nontender, no swelling, no obvious deformity Extremities: no edema, no cyanosis, no clubbing Pulses: 1+ symmetric, upper and lower extremities, normal cap refill Neurological: alert, oriented x 3, CN2-12 intact, strength normal upper extremities and lower extremities, sensation  decreased throughout bottom of bilateral feet, DTRs 2+ throughout, no cerebellar signs, gait normal Psychiatric: normal affect, behavior normal, pleasant  Breast: defer Gyn: defer Rectal: defer  Medicare Attestation I have personally reviewed: The patient's medical and social history Their use of alcohol, tobacco or illicit drugs Their current medications and supplements The patient's functional ability including ADLs,fall risks, home safety risks,  cognitive, and hearing and visual impairment Diet and physical activities Evidence for depression or mood disorders  The patient's weight, height, BMI, and visual acuity have been recorded in the chart.  I have made referrals, counseling, and provided education to the patient based on review of the above and I have provided the patient with a written personalized care plan for preventive services.     Garnet Sierras, NP   12/15/2020

## 2020-12-15 NOTE — Telephone Encounter (Signed)
Sent via Epic to Dr. Reola Calkins Man - My chart Message sent to Patient .

## 2020-12-16 LAB — COMPLETE METABOLIC PANEL WITH GFR
AG Ratio: 1.3 (calc) (ref 1.0–2.5)
ALT: 18 U/L (ref 6–29)
AST: 25 U/L (ref 10–35)
Albumin: 4 g/dL (ref 3.6–5.1)
Alkaline phosphatase (APISO): 83 U/L (ref 37–153)
BUN/Creatinine Ratio: 14 (calc) (ref 6–22)
BUN: 25 mg/dL (ref 7–25)
CO2: 30 mmol/L (ref 20–32)
Calcium: 9.6 mg/dL (ref 8.6–10.4)
Chloride: 101 mmol/L (ref 98–110)
Creat: 1.78 mg/dL — ABNORMAL HIGH (ref 0.60–0.93)
GFR, Est African American: 32 mL/min/{1.73_m2} — ABNORMAL LOW (ref >=60)
GFR, Est Non African American: 28 mL/min/{1.73_m2} — ABNORMAL LOW (ref >=60)
Globulin: 3.1 g/dL (calc) (ref 1.9–3.7)
Glucose, Bld: 240 mg/dL — ABNORMAL HIGH (ref 65–99)
Potassium: 4.7 mmol/L (ref 3.5–5.3)
Sodium: 140 mmol/L (ref 135–146)
Total Bilirubin: 0.5 mg/dL (ref 0.2–1.2)
Total Protein: 7.1 g/dL (ref 6.1–8.1)

## 2020-12-16 LAB — LIPID PANEL
Cholesterol: 130 mg/dL (ref <200)
HDL: 51 mg/dL (ref >=50)
LDL Cholesterol (Calc): 58 mg/dL (calc)
Non-HDL Cholesterol (Calc): 79 mg/dL (calc) (ref <130)
Total CHOL/HDL Ratio: 2.5 (calc) (ref <5.0)
Triglycerides: 128 mg/dL (ref <150)

## 2020-12-16 LAB — CBC WITH DIFFERENTIAL/PLATELET
Absolute Monocytes: 605 cells/uL (ref 200–950)
Basophils Absolute: 67 cells/uL (ref 0–200)
Basophils Relative: 0.6 %
Eosinophils Absolute: 78 cells/uL (ref 15–500)
Eosinophils Relative: 0.7 %
HCT: 48.1 % — ABNORMAL HIGH (ref 35.0–45.0)
Hemoglobin: 15.4 g/dL (ref 11.7–15.5)
Lymphs Abs: 2363 cells/uL (ref 850–3900)
MCH: 31.9 pg (ref 27.0–33.0)
MCHC: 32 g/dL (ref 32.0–36.0)
MCV: 99.6 fL (ref 80.0–100.0)
MPV: 11.4 fL (ref 7.5–12.5)
Monocytes Relative: 5.4 %
Neutro Abs: 8086 cells/uL — ABNORMAL HIGH (ref 1500–7800)
Neutrophils Relative %: 72.2 %
Platelets: 236 10*3/uL (ref 140–400)
RBC: 4.83 10*6/uL (ref 3.80–5.10)
RDW: 11.1 % (ref 11.0–15.0)
Total Lymphocyte: 21.1 %
WBC: 11.2 10*3/uL — ABNORMAL HIGH (ref 3.8–10.8)

## 2020-12-16 LAB — URINALYSIS W MICROSCOPIC + REFLEX CULTURE
Bacteria, UA: NONE SEEN /HPF
Bilirubin Urine: NEGATIVE
Hgb urine dipstick: NEGATIVE
Hyaline Cast: NONE SEEN /LPF
Ketones, ur: NEGATIVE
Leukocyte Esterase: NEGATIVE
Nitrites, Initial: NEGATIVE
RBC / HPF: NONE SEEN /HPF (ref 0–2)
Specific Gravity, Urine: 1.024 (ref 1.001–1.03)
Squamous Epithelial / HPF: NONE SEEN /HPF (ref <=5)
pH: <=5 (ref 5.0–8.0)

## 2020-12-16 LAB — HEMOGLOBIN A1C
Hgb A1c MFr Bld: 8.4 % of total Hgb — ABNORMAL HIGH (ref <5.7)
Mean Plasma Glucose: 194 mg/dL
eAG (mmol/L): 10.8 mmol/L

## 2020-12-16 LAB — TSH: TSH: 1.66 mIU/L (ref 0.40–4.50)

## 2020-12-16 LAB — NO CULTURE INDICATED

## 2020-12-18 ENCOUNTER — Other Ambulatory Visit: Payer: Self-pay | Admitting: Adult Health Nurse Practitioner

## 2021-01-01 ENCOUNTER — Ambulatory Visit (INDEPENDENT_AMBULATORY_CARE_PROVIDER_SITE_OTHER): Payer: Medicare Other | Admitting: Podiatry

## 2021-01-01 ENCOUNTER — Encounter: Payer: Self-pay | Admitting: Podiatry

## 2021-01-01 ENCOUNTER — Other Ambulatory Visit: Payer: Self-pay

## 2021-01-01 DIAGNOSIS — B351 Tinea unguium: Secondary | ICD-10-CM

## 2021-01-01 DIAGNOSIS — M79674 Pain in right toe(s): Secondary | ICD-10-CM

## 2021-01-01 DIAGNOSIS — Z794 Long term (current) use of insulin: Secondary | ICD-10-CM

## 2021-01-01 DIAGNOSIS — M79675 Pain in left toe(s): Secondary | ICD-10-CM

## 2021-01-01 DIAGNOSIS — E1159 Type 2 diabetes mellitus with other circulatory complications: Secondary | ICD-10-CM

## 2021-01-06 ENCOUNTER — Encounter: Payer: Self-pay | Admitting: Podiatry

## 2021-01-06 NOTE — Progress Notes (Signed)
  Subjective:  Patient ID: [name], female    DOB: Jun 01, 1946,  MRN: [id number]  Chief Complaint  Patient presents with  . Nail Problem    Possible nail fungus    75 y.o. female returns for the above complaint.  Patient presents with thickened elongated dystrophic toenails x10.  Pain on palpation to the toenails.  Patient not able to debride down herself.  She is a diabetic and uncontrolled A1c of 8.4.  She has not seen anyone prior to see me in a long time.  She had her diabetes being managed few years ago but has not followed up since then.  She denies any other acute complaints.  Objective:  There were no vitals filed for this visit. Podiatric Exam: Vascular: dorsalis pedis and posterior tibial pulses are faintly palpable bilateral. Capillary return is sluggish temperature gradient is WNL. Skin turgor WNL  Sensorium: Normal Semmes Weinstein monofilament test. Normal tactile sensation bilaterally. Nail Exam: Pt has thick disfigured discolored nails with subungual debris noted bilateral entire nail hallux through fifth toenails.  Pain on palpation to the nails. Ulcer Exam: There is no evidence of ulcer or pre-ulcerative changes or infection. Orthopedic Exam: Muscle tone and strength are WNL. No limitations in general ROM. No crepitus or effusions noted. HAV  B/L.  Hammer toes 2-5  B/L. Skin: No Porokeratosis. No infection or ulcers    Assessment & Plan:  No diagnosis found.  Patient was evaluated and treated and all questions answered.  Onychomycosis with pain  -Nails palliatively debrided as below. -Educated on self-care  Procedure: Nail Debridement Rationale: pain  Type of Debridement: manual, sharp debridement. Instrumentation: Nail nipper, rotary burr. Number of Nails: 10  Procedures and Treatment: Consent by patient was obtained for treatment procedures. The patient understood the discussion of treatment and procedures well. All questions were answered thoroughly  reviewed. Debridement of mycotic and hypertrophic toenails, 1 through 5 bilateral and clearing of subungual debris. No ulceration, no infection noted.  Return Visit-Office Procedure: Patient instructed to return to the office for a follow up visit 3 months for continued evaluation and treatment.  Boneta Lucks, DPM    Return for [facility] .

## 2021-01-12 ENCOUNTER — Other Ambulatory Visit: Payer: Self-pay

## 2021-01-12 ENCOUNTER — Ambulatory Visit (INDEPENDENT_AMBULATORY_CARE_PROVIDER_SITE_OTHER): Payer: Medicare Other | Admitting: Otolaryngology

## 2021-01-12 ENCOUNTER — Other Ambulatory Visit: Payer: Self-pay | Admitting: Adult Health Nurse Practitioner

## 2021-01-12 ENCOUNTER — Other Ambulatory Visit: Payer: Self-pay | Admitting: Internal Medicine

## 2021-01-12 VITALS — Temp 96.6°F

## 2021-01-12 DIAGNOSIS — F411 Generalized anxiety disorder: Secondary | ICD-10-CM

## 2021-01-12 DIAGNOSIS — I779 Disorder of arteries and arterioles, unspecified: Secondary | ICD-10-CM

## 2021-01-12 DIAGNOSIS — J392 Other diseases of pharynx: Secondary | ICD-10-CM

## 2021-01-12 DIAGNOSIS — Z794 Long term (current) use of insulin: Secondary | ICD-10-CM

## 2021-01-12 DIAGNOSIS — E1122 Type 2 diabetes mellitus with diabetic chronic kidney disease: Secondary | ICD-10-CM

## 2021-01-12 NOTE — Progress Notes (Signed)
HPI: [name] is a 75 y.o. female who presents is referred by Dr. [name] for evaluation of nasopharyngeal cyst seen on recent MRI scan of the brain performed at [facility] neurology.  This apparently has enlarged some since previous exam 4 years ago.  Patient has no nasal symptoms.  She does complain of a lump underneath the right mandible in the region of the submandibular gland that she feels like has gotten larger. I reviewed the scan with the patient in the office today.  Past Medical History:  Diagnosis Date  . Abnormal findings on esophagogastroduodenoscopy (EGD) 07/2010  . Aneurysm [facility]) 2013   Right Brain   . 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 Anxiety   . Arthritis   . Cataract   . Chronic kidney disease   . CKD (chronic kidney disease), stage IV ([place])   . Colon polyps   . Depression   . Diabetes mellitus 1998  . Diverticulosis   . Emphysema of lung ([place])   . ESOPHAGEAL STRICTURE 08/27/2008  . GERD (gastroesophageal reflux disease)   . Hemorrhoids   . Hiatal hernia   . Hyperlipidemia   . Hypertension 2000  . Migraines   . Peripheral vascular disease ([place])   . Phlebitis    30 years ago  left leg  . Stroke [facility])    multiple mini strokes ( brain aneurysm )   Past Surgical History:  Procedure Laterality Date  . ABDOMINAL AORTAGRAM N/A 01/04/2012   Procedure: ABDOMINAL Maxcine Ham;  Surgeon: Serafina Mitchell, MD;  Location: [facility] CATH LAB;  Service: Cardiovascular;  Laterality: N/A;  . ABDOMINAL AORTAGRAM N/A 08/15/2012   Procedure: ABDOMINAL Maxcine Ham;  Surgeon: Serafina Mitchell, MD;  Location[facility] CATH LAB;  Service: Cardiovascular;  Laterality: N/A;  . ABDOMINAL HYSTERECTOMY  1984  . cataract surgery Right 10-22-2015  . cataract surgery Left 11-05-2015  . CESAREAN SECTION    . CHOLECYSTECTOMY    . COLONOSCOPY  07/2010  . DENTAL SURGERY  Aug. 16, 2013   left lower   . ESOPHAGOGASTRODUODENOSCOPY  07/2010  . EYE SURGERY  Nov. 2014   Laser-Glaucoma  . FEMORAL ARTERY  STENT  05/11/11   Left superficial femoral and popliteal artery  . HERNIA REPAIR     times two  . KNEE SURGERY    . LOWER EXTREMITY ANGIOGRAM Left 08/15/2012   Procedure: LOWER EXTREMITY ANGIOGRAM;  Surgeon: Serafina Mitchell, MD;  Location: [facility] CATH LAB;  Service: Cardiovascular;  Laterality: Left;  lt leg angio  . rotator cuff surgery    . THYROID SURGERY     Social History   Socioeconomic History  . Marital status: Married    Spouse name: Not on file  . Number of children: 2  . Years of education: Not on file  . Highest education level: Not on file  Occupational History  . 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 Smoking status: Light Tobacco Smoker    Packs/day: 0.50    Years: 40.00    Pack years: 20.00    Types: Cigarettes  . Smokeless tobacco: Never Used  . Tobacco comment: 11/18/20 in process of quiting  Vaping Use  . Vaping Use: Never used  Substance and Sexual Activity  . Alcohol use: No    Alcohol/week: 0.0 standard drinks  . Drug use: No  . Sexual activity: Not on file  Other Topics Concern  . Not on file  Social History Narrative  Lives with husband.        Social Determinants of Health   Financial Resource Strain: Not on file  Food Insecurity: Not on file  Transportation Needs: Not on file  Physical Activity: Not on file  Stress: Not on file  Social Connections: Not on file   Family History  Problem Relation Age of Onset  . CAD Mother 34       Died of MI  . Hypertension Mother   . Heart attack Mother   . Heart disease Mother   . CAD Father 49       Died of MI  . Heart disease Father   . Heart attack Father   . CAD Brother 24       Two brothers died of MI  . Heart attack Brother   . Heart disease Brother        Amputation  . Diabetes Sister   . Hypertension Sister   . Heart attack Brother   . Heart disease Brother   . Stroke Sister   . Colon cancer Neg Hx   . Stomach cancer Neg Hx   . Esophageal cancer Neg Hx     Allergies  Allergen Reactions  . Ciprofloxacin Other (See Comments)  . Ozempic (0.25 Or 0.5 Mg-Dose) [Semaglutide(0.25 Or 0.34m-Dos)] Nausea And Vomiting  . Plavix [Clopidogrel Bisulfate] Palpitations  . Amoxicillin Itching and Swelling    FACE & EYES SWELL  . Ace Inhibitors   . Ciprofloxacin Hcl Swelling  . Lyrica [Pregabalin]     Itch, gain weight  . Penicillins Other (See Comments)    REACTION: unspecified   Prior to Admission medications   Medication Sig Start Date End Date Taking? Authorizing Provider  albuterol (VENTOLIN HFA) 108 (90 Base) MCG/ACT inhaler Inhale 2 puffs into the lungs every 4 (four) hours as needed for wheezing or shortness of breath. Patient not taking: Reported on 12/15/2020 08/27/19   CNoemi ChapelP, DO  amLODipine (NORVASC) 10 MG tablet TAKE 1 TABLET DAILY FOR BLOOD PRESSURE 05/07/20   MUnk Pinto MD  aspirin 325 MG EC tablet Take 325 mg by mouth daily.    [provider]  b complex vitamins capsule Take 1 capsule by mouth daily.    [provider]  Blood Glucose Monitoring Suppl (ACCU-CHEK AVIVA PLUS) W/DEVICE KIT Check blood sugar up to three times daily 03/26/15   CVladimir Crofts PA-C  chlorthalidone (HYGROTON) 25 MG tablet Take 12.5 mg by mouth daily. 11/21/20   [provider]  Cholecalciferol (VITAMIN D PO) Take 5,000 Units by mouth daily.    [provider]  CINNAMON PO Take 1,000 mg by mouth 2 (two) times daily.     [provider]  citalopram (CELEXA) 40 MG tablet TAKE 1 TABLET DAILY FOR MOOD & ANXIETY 08/18/20   McClanahan, KDanton Sewer NP  clonazePAM (KLONOPIN) 0.5 MG tablet TAKE 1/2 - 1 TABLET AT BEDTIME ONLY IF NEEDED FORSLEEP & LIMIT TO 5 DAYS /WEEK TO AVOID ADDICTION & DEMENTIA. SCRIPT SHOULD LAST LONGER THAN 30 DAYS 01/12/21   CLiane Comber NP  FARXIGA 10 MG TABS tablet TAKE 10 MG BY MOUTH DAILY BEFORE BREAKFAST. 10/30/20   GPhilemon Kingdom MD  glucose blood (ACCU-CHEK AVIVA PLUS) test strip Use  daily to check BS TID Dx. E11.22 08/05/15   ORolene Course PA-C  insulin aspart (NOVOLOG FLEXPEN) 100 UNIT/ML FlexPen Inject 6-8 Units into the skin 3 (three) times daily with meals. Patient taking differently: Inject 14 Units into the  skin 3 (three) times daily with meals. 01/14/20   Philemon Kingdom, MD  insulin degludec (TRESIBA FLEXTOUCH) 200 UNIT/ML FlexTouch Pen Inject 40units daily under skin. 12/15/20   Garnet Sierras, NP  Insulin Pen Needle 29G X 5MM MISC Use with insulin pen 3 times a day. 01/16/20   Philemon Kingdom, MD  Magnesium 500 MG TABS Take 500 mg by mouth daily.    [provider]  olmesartan (BENICAR) 40 MG tablet TAKE 1 TABLET DAILY FOR BLOOD PRESSURE 05/07/20   [name], MD  Omega-3 Fatty Acids (FISH OIL) 1200 MG CAPS Take 1,200 capsules by mouth daily.     [provider]  omeprazole (PRILOSEC) 40 MG capsule Take 1 capsule (40 mg total) by mouth in the morning and at bedtime. 07/22/20   Irene Shipper, MD  OVER THE COUNTER MEDICATION Take 1 tablet by mouth daily. One a day vitamin Woman's one a day    [provider]  rosuvastatin (CRESTOR) 40 MG tablet Take 1 tablet Daily for Cholesterol 12/18/20   Liane Comber, NP  Tiotropium Bromide Monohydrate (SPIRIVA RESPIMAT) 2.5 MCG/ACT AERS Inhale 1 puff into the lungs daily. 08/27/19   Julian Hy, DO  vitamin E 400 UNIT capsule Take 400 Units by mouth daily.    [provider]     Positive ROS: Otherwise negative  All other systems have been reviewed and were otherwise negative with the exception of those mentioned in the HPI and as above.  Physical Exam: Constitutional: Alert, well-appearing, no acute distress Ears: External ears without lesions or tenderness. Ear canals are clear bilaterally with intact, clear TMs.  Nasal: External nose without lesions. Septum is midline..  On nasopharyngoscopy through the left nostril.  Patient has a small approximate 1 cm cystic lesion at the  inferior aspect of the left eustachian tube region.  This is benign in appearance and has normal mucosal coverage. Oral: Lips and gums without lesions. Tongue and palate mucosa without lesions. Posterior oropharynx clear.  She has small symmetric tonsils bilaterally.  Actually when she gags you can barely see the cyst at the inferior aspect of the left nasopharynx. Neck: No palpable adenopathy or masses.  Palpation of the right submandibular gland is benign to palpation with no discrete masses or nodules. Respiratory: Breathing comfortably  Skin: No facial/neck lesions or rash noted.  Procedures  Assessment: Benign left nasopharyngeal cyst  Plan: No further intervention is needed for this.  Discussed this with the patient.  Unless she has any trouble breathing which she does not no further intervention is warranted.   Radene Journey, MD   CC:

## 2021-01-13 DIAGNOSIS — R609 Edema, unspecified: Secondary | ICD-10-CM | POA: Diagnosis not present

## 2021-01-13 DIAGNOSIS — E1122 Type 2 diabetes mellitus with diabetic chronic kidney disease: Secondary | ICD-10-CM | POA: Diagnosis not present

## 2021-01-13 DIAGNOSIS — R42 Dizziness and giddiness: Secondary | ICD-10-CM | POA: Diagnosis not present

## 2021-01-13 DIAGNOSIS — N184 Chronic kidney disease, stage 4 (severe): Secondary | ICD-10-CM | POA: Diagnosis not present

## 2021-01-13 DIAGNOSIS — I129 Hypertensive chronic kidney disease with stage 1 through stage 4 chronic kidney disease, or unspecified chronic kidney disease: Secondary | ICD-10-CM | POA: Diagnosis not present

## 2021-01-26 ENCOUNTER — Ambulatory Visit (INDEPENDENT_AMBULATORY_CARE_PROVIDER_SITE_OTHER): Payer: Medicare Other | Admitting: Internal Medicine

## 2021-01-26 ENCOUNTER — Encounter: Payer: Self-pay | Admitting: Internal Medicine

## 2021-01-26 ENCOUNTER — Other Ambulatory Visit: Payer: Self-pay

## 2021-01-26 VITALS — BP 128/78 | HR 70 | Ht 66.0 in | Wt 191.6 lb

## 2021-01-26 DIAGNOSIS — E785 Hyperlipidemia, unspecified: Secondary | ICD-10-CM | POA: Diagnosis not present

## 2021-01-26 DIAGNOSIS — I779 Disorder of arteries and arterioles, unspecified: Secondary | ICD-10-CM

## 2021-01-26 DIAGNOSIS — Z794 Long term (current) use of insulin: Secondary | ICD-10-CM

## 2021-01-26 DIAGNOSIS — E1169 Type 2 diabetes mellitus with other specified complication: Secondary | ICD-10-CM | POA: Diagnosis not present

## 2021-01-26 DIAGNOSIS — E1122 Type 2 diabetes mellitus with diabetic chronic kidney disease: Secondary | ICD-10-CM | POA: Diagnosis not present

## 2021-01-26 DIAGNOSIS — N184 Chronic kidney disease, stage 4 (severe): Secondary | ICD-10-CM

## 2021-01-26 DIAGNOSIS — E1340 Other specified diabetes mellitus with diabetic neuropathy, unspecified: Secondary | ICD-10-CM

## 2021-01-26 NOTE — Progress Notes (Addendum)
Patient ID: [name], female   DOB: Jul 05, 1946, 75 y.o.   MRN: [id number]   This visit occurred during the SARS-CoV-2 public health emergency.  Safety protocols were in place, including screening questions prior to the visit, additional usage of staff PPE, and extensive cleaning of exam room while observing appropriate contact time as indicated for disinfecting solutions.   HPI: [name] is a 75 y.o.-year-old female, initially referred by her cardiologist, [name], presenting for follow-up for DM2, dx in ~2000, insulin-dependent since ~2005, uncontrolled, with complications (PVD - s/p stents and fem-pop bypass; h/o CVA x3; CKD stage 3b; DR; PN). She saw my colleague, [name] many years ago.  Last visit with me 4 months ago. PCP: Karen Mutters, PA  Interim history: No increased urination, blurry vision, nausea. She does have joint pains and inability to lose weight.  Reviewed HbA1c levels: Lab Results  Component Value Date   HGBA1C 8.4 (H) 12/15/2020   HGBA1C 8.7 (A) 09/22/2020   HGBA1C 9.6 (A) 05/21/2020   HGBA1C 9.8 (A) 01/14/2020   HGBA1C 9.1 (A) 07/09/2019   HGBA1C 10.9 (H) 11/13/2018   HGBA1C 9.7 (H) 07/19/2018   HGBA1C 8.7 (H) 04/18/2018   HGBA1C 8.3 (H) 01/03/2018   HGBA1C 10.0 (H) 09/27/2017  09/22/2020: HbA1c calculated from fructosamine is much better than the directly measured HbA1c, at 6.25% 05/21/2020: HbA1c calculated from fructosamine 7.3%, much better than the directly measured one, and stable from before 01/14/2020: HbA1c calculated from fructosamine 7.3% 10/08/2019: HbA1c calculated from fructosamine: 6.9%  Her antipancreatic antibodies were negative: Component     Latest Ref Rng & Units 07/22/2016  Glutamic Acid Decarb Ab     <5 IU/mL <5   She was previously on: - Novolog 70/30 35 units before breakfast and 30 units before dinner- injecting hips and arms due to abdominal pain - Farxiga 5 mg in am - started 06/2019 >> 10 mg daily - increased  09/2019  Currently on: - Farxiga 10 mg daily before b'fast - Tresiba 40 units daily - Novolog: 10-14 units 2x day before meals 4-5 units for correction of a blood sugars >200  She could not tolerate Ozempic (started 11/2018) due to nausea, vomiting, abdominal pain-stopped 04/2019. She was on Invokana >> intolerance 2/2 weight loss and dehydration >> hospitalization. She was on Metformin and sulfonylurea at diagnosis. She was also on Januvia at the same time with Invokana >> stopped along with Invokana   Pt checks her sugars 1x a day (forgot log): - am:  86-138 >> 88, 102-210, 253, 283 >> 112-218 >> 121-140 - 2h after b'fast: 326 >> n/c >> 326 (10/2019) >> n/c - before lunch: 265 >> 124-172 >> n/c - 2h after lunch: 280, 286 >> n/c - before dinner: 68-157 >> 73-193, 215 >> n/c >> 180 (doughnuts) - 2h after dinner: 247 >> 196- 201 >> 172 >> n/c  - bedtime: n/c - nighttime: n/c >> 68  - seldom Lowest sugar was 676 >> 64 x1, 84 >> 68; she has hypoglycemia awareness at 80. Highest sugar was 335 (no medications) >> 218 >> 180.  Glucometer: [place]  Pt's meals are: - Breakfast: eggs or cheese half a sandwich - Lunch: half a sandwich, apple - Dinner 9-9:30 pm: chicken, veggies - Snacks: stopped cheese; now apples and pickles, bedtime snack: orange or apple  She works part-time, from 10 am - 2 pm Tue-Fri.   -+ CKD stage IIIb ([name]), last BUN/creatinine:  Lab Results  Component Value Date  BUN 25 12/15/2020   BUN 24 (H) 10/28/2020   CREATININE 1.78 (H) 12/15/2020   CREATININE 1.86 (H) 10/28/2020   Lab Results  Component Value Date   GFRAA 32 (L) 12/15/2020   GFRAA 25 (L) 08/04/2020   GFRAA 23 (L) 07/31/2020   GFRAA 22 (L) 07/30/2020   GFRAA 31 (L) 10/18/2019  On olmesartan 40.  -+ HL; last set of lipids: Lab Results  Component Value Date   CHOL 130 12/15/2020   HDL 51 12/15/2020   LDLCALC 58 12/15/2020   LDLDIRECT 146.4 01/30/2010   TRIG 128 12/15/2020   CHOLHDL  2.5 12/15/2020  On Crestor 40, omega-3 fatty acids 1200 mg daily.  - last eye exam was: [facility] Diabetic Eye Exam 09/01/2020 Retinopathy* No Retinopathy  She also has glaucoma.  -+ Numbness and tingling in her feet.  She has a callus on the left foot.  She sees a podiatrist.  She did not tolerate Lyrica.  On ASA 81.  Pt has FH of DM in sister.  She also has a history of nontoxic multinodular goiter.    Latest TSH was reviewed and this was normal: Lab Results  Component Value Date   TSH 1.66 12/15/2020   She also has a history of HTN, brain aneurysm, emphysema, esophageal stricture, GERD.  She had hernia repair surgery- had mesh placed >> persistent abdominal pain.  Her husband's nephew, [name], was also my patient and he died at the beginning of 12/01/2019 after he stopped going to dialysis.  ROS: Constitutional: no weight gain/no weight loss, no fatigue, no subjective hyperthermia, no subjective hypothermia Eyes: no blurry vision, no xerophthalmia ENT: no sore throat, + dysphagia, no odynophagia, no hoarseness Cardiovascular: no CP/no SOB/no palpitations/no leg swelling Respiratory: no cough/no SOB/no wheezing Gastrointestinal: no N/no V/no D/no C/no acid reflux Musculoskeletal: no muscle aches/+ joint aches Skin: no rashes, no hair loss Neurological: no tremors/no numbness/no tingling/no dizziness  I reviewed pt's medications, allergies, PMH, social hx, family hx, and changes were documented in the history of present illness. Otherwise, unchanged from my initial visit note.  Past Medical History:  Diagnosis Date  . Abnormal findings on esophagogastroduodenoscopy (EGD) 07/2010  . Aneurysm [facility]) 12/01/11   Right Brain   . Aneurysm ([place])    in brain x 2  . Anxiety   . Arthritis   . Cataract   . Chronic kidney disease   . CKD (chronic kidney disease), stage IV ([place])   . Colon polyps   . Depression   . Diabetes mellitus 1998  . Diverticulosis   . Emphysema of lung ([place])    . ESOPHAGEAL STRICTURE 08/27/2008  . GERD (gastroesophageal reflux disease)   . Hemorrhoids   . Hiatal hernia   . Hyperlipidemia   . Hypertension 11-30-98  . Migraines   . Peripheral vascular disease ([place])   . Phlebitis    30 years ago  left leg  . Stroke [facility])    multiple mini strokes ( brain aneurysm )   Past Surgical History:  Procedure Laterality Date  . ABDOMINAL AORTAGRAM N/A 01/04/2012   Procedure: ABDOMINAL Maxcine Ham;  Surgeon: Serafina Mitchell, MD;  Location: [facility] CATH LAB;  Service: Cardiovascular;  Laterality: N/A;  . ABDOMINAL AORTAGRAM N/A 08/15/2012   Procedure: ABDOMINAL Maxcine Ham;  Surgeon: Serafina Mitchell, MD;  Location: [facility] CATH LAB;  Service: Cardiovascular;  Laterality: N/A;  . ABDOMINAL HYSTERECTOMY  30-Nov-1982  . cataract surgery Right 10-22-2015  . cataract surgery Left 11-05-2015  . CESAREAN  SECTION    . CHOLECYSTECTOMY    . COLONOSCOPY  07/2010  . DENTAL SURGERY  Aug. 16, 2013   left lower   . ESOPHAGOGASTRODUODENOSCOPY  07/2010  . EYE SURGERY  Nov. 2014   Laser-Glaucoma  . FEMORAL ARTERY STENT  05/11/11   Left superficial femoral and popliteal artery  . HERNIA REPAIR     times two  . KNEE SURGERY    . LOWER EXTREMITY ANGIOGRAM Left 08/15/2012   Procedure: LOWER EXTREMITY ANGIOGRAM;  Surgeon: Serafina Mitchell, MD;  Location: [facility] CATH LAB;  Service: Cardiovascular;  Laterality: Left;  lt leg angio  . rotator cuff surgery    . THYROID SURGERY     Social History   Socioeconomic History  . Marital status: Married    Spouse name: Not on file  . Number of children: 2  . Years of education: Not on file  . Highest education level: Not on file  Occupational History  . 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 Smoking status: Light Tobacco Smoker    Packs/day: 0.50    Years: 40.00    Pack years: 20.00    Types: Cigarettes  . Smokeless tobacco: Never Used  . Tobacco comment: 11/18/20 in process of quiting  Vaping Use  . Vaping Use: Never  used  Substance and Sexual Activity  . Alcohol use: No    Alcohol/week: 0.0 standard drinks  . Drug use: No  . Sexual activity: Not on file  Other Topics Concern  . Not on file  Social History Narrative   Lives with husband.        Social Determinants of Health   Financial Resource Strain: Not on file  Food Insecurity: Not on file  Transportation Needs: Not on file  Physical Activity: Not on file  Stress: Not on file  Social Connections: Not on file  Intimate Partner Violence: Not on file   Current Outpatient Medications on File Prior to Visit  Medication Sig Dispense Refill  . albuterol (VENTOLIN HFA) 108 (90 Base) MCG/ACT inhaler Inhale 2 puffs into the lungs every 4 (four) hours as needed for wheezing or shortness of breath. (Patient not taking: Reported on 12/15/2020) 18 g 11  . amLODipine (NORVASC) 10 MG tablet TAKE 1 TABLET DAILY FOR BLOOD PRESSURE 90 tablet 3  . aspirin 325 MG EC tablet Take 325 mg by mouth daily.    Marland Kitchen b complex vitamins capsule Take 1 capsule by mouth daily.    . Blood Glucose Monitoring Suppl (ACCU-CHEK AVIVA PLUS) W/DEVICE KIT Check blood sugar up to three times daily 1 kit 0  . chlorthalidone (HYGROTON) 25 MG tablet Take 12.5 mg by mouth daily.    . Cholecalciferol (VITAMIN D PO) Take 5,000 Units by mouth daily.    Marland Kitchen CINNAMON PO Take 1,000 mg by mouth 2 (two) times daily.     . citalopram (CELEXA) 40 MG tablet TAKE 1 TABLET DAILY FOR MOOD & ANXIETY 90 tablet 3  . clonazePAM (KLONOPIN) 0.5 MG tablet TAKE 1/2 - 1 TABLET AT BEDTIME ONLY IF NEEDED FORSLEEP & LIMIT TO 5 DAYS /WEEK TO AVOID ADDICTION & DEMENTIA. SCRIPT SHOULD LAST LONGER THAN 30 DAYS 30 tablet 0  . FARXIGA 10 MG TABS tablet TAKE 10 MG BY MOUTH DAILY BEFORE BREAKFAST. 90 tablet 3  . glucose blood (ACCU-CHEK AVIVA PLUS) test strip Use daily to check BS TID Dx. E11.22 300 each 1  . insulin aspart (NOVOLOG FLEXPEN) 100 UNIT/ML  FlexPen Inject 6-8 Units into the skin 3 (three) times daily with  meals. (Patient taking differently: Inject 14 Units into the skin 3 (three) times daily with meals.) 15 mL 11  . insulin degludec (TRESIBA FLEXTOUCH) 200 UNIT/ML FlexTouch Pen INJECT 34 UNITS DAILY UNDER SKIN 9 mL 1  . Insulin Pen Needle 29G X 5MM MISC Use with insulin pen 3 times a day. 300 each 11  . Magnesium 500 MG TABS Take 500 mg by mouth daily.    Marland Kitchen olmesartan (BENICAR) 40 MG tablet TAKE 1 TABLET DAILY FOR BLOOD PRESSURE 90 tablet 3  . Omega-3 Fatty Acids (FISH OIL) 1200 MG CAPS Take 1,200 capsules by mouth daily.     Marland Kitchen omeprazole (PRILOSEC) 40 MG capsule Take 1 capsule (40 mg total) by mouth in the morning and at bedtime. 180 capsule 3  . OVER THE COUNTER MEDICATION Take 1 tablet by mouth daily. One a day vitamin Woman's one a day    . rosuvastatin (CRESTOR) 40 MG tablet Take 1 tablet Daily for Cholesterol 90 tablet 1  . Tiotropium Bromide Monohydrate (SPIRIVA RESPIMAT) 2.5 MCG/ACT AERS Inhale 1 puff into the lungs daily. 4 g 11  . vitamin E 400 UNIT capsule Take 400 Units by mouth daily.     No current facility-administered medications on file prior to visit.   Allergies  Allergen Reactions  . Ciprofloxacin Other (See Comments)  . Ozempic (0.25 Or 0.5 Mg-Dose) [Semaglutide(0.25 Or 0.31m-Dos)] Nausea And Vomiting  . Plavix [Clopidogrel Bisulfate] Palpitations  . Amoxicillin Itching and Swelling    FACE & EYES SWELL  . Ace Inhibitors   . Ciprofloxacin Hcl Swelling  . Lyrica [Pregabalin]     Itch, gain weight  . Penicillins Other (See Comments)    REACTION: unspecified   Family History  Problem Relation Age of Onset  . CAD Mother 669      Died of MI  . Hypertension Mother   . Heart attack Mother   . Heart disease Mother   . CAD Father 623      Died of MI  . Heart disease Father   . Heart attack Father   . CAD Brother 672      Two brothers died of MI  . Heart attack Brother   . Heart disease Brother        Amputation  . Diabetes Sister   . Hypertension Sister   .  Heart attack Brother   . Heart disease Brother   . Stroke Sister   . Colon cancer Neg Hx   . Stomach cancer Neg Hx   . Esophageal cancer Neg Hx     PE: BP 128/78 (BP Location: Right Arm, Patient Position: Sitting, Cuff Size: Normal)   Pulse 70   Ht 5' 6"  (1.676 m)   Wt 191 lb 9.6 oz (86.9 kg)   SpO2 96%   BMI 30.93 kg/m  Wt Readings from Last 3 Encounters:  01/26/21 191 lb 9.6 oz (86.9 kg)  12/15/20 189 lb (85.7 kg)  12/01/20 190 lb (86.2 kg)   Constitutional: overweight, in NAD Eyes: PERRLA, EOMI, no exophthalmos ENT: moist mucous membranes, no thyromegaly, no cervical lymphadenopathy Cardiovascular: RRR, No MRG Respiratory: CTA B Gastrointestinal: abdomen soft, NT, ND, BS+ Musculoskeletal: no deformities, strength intact in all 4 Skin: moist, warm, no rashes Neurological: no tremor with outstretched hands, DTR normal in all 4  ASSESSMENT: 1. DM2, insulin-dependent, uncontrolled, with complications - PVD - s/p stents and fem-pop  bypass - h/o CVA - CKD stage 3b - DR - PN  2.  Hyperlipidemia  3.  Obesity class 1  PLAN:  1. Patient with longstanding, uncontrolled, type 2 diabetes, previously on premixed insulin regimen and SGLT2 inhibitor, currently on a basal-bolus insulin regimen along with SGLT2 inhibitor.  We did not use Invokana for her due to severe PVD and also previous intolerance with severe dehydration, but she tolerates [place] well.  She could not tolerate a GLP-1 receptor agonist (Ozempic) due to GI symptoms.  She did not want to try Trulicity.  Also, she refused to try a VGo patch pump. -At last visit, she was only checking sugars in the morning and not every day.  We discussed about the importance of checking blood sugars daily and also rotating checks throughout the day, especially before meals.  Sugars were still high in the morning, occasionally above 200s.  Upon questioning, she was taking [place] daily, and correctly, however, she was taking NovoLog in  the morning without relationship to breakfast.  I advised her to take the NovoLog 15 minutes before every meal.  We also discussed about how to correct high blood sugars above 200s.  Ultimately, I suggested to stop her peanut butter crackers in the morning. -At today's visit, sugars are better in the morning, mostly at goal or slightly above goal.  However, she is not regulating the day and I again advised her to start.  She is following the recommended regimen and also uses occasionally NovoLog for correcting high blood sugars.  An HbA1c obtained in 11/2020 was slightly better, at 8.4%, however, fructosamine levels are more accurate for her.  We will check this today.  I do not feel we need to change her regimen for now. - I suggested to:  Patient Instructions  Please  continue: - Farxiga 10 mg daily before b'fast - Tresiba 40 units daily - Novolog: 10-14 units 2x day before meals 4-5 units for correction of a blood sugars >200   Check sugars 2-3x a day before meals.  Please return in 3-4 months with your sugar log.    - we we will check a fructosamine level at today's visit - advised to check sugars at different times of the day - 3x a day, rotating check times - advised for yearly eye exams >> she is UTD - return to clinic in 3-4 months  2. HL -Reviewed latest lipid panel from 11/2020: All fractions at goal: Lab Results  Component Value Date   CHOL 130 12/15/2020   HDL 51 12/15/2020   LDLCALC 58 12/15/2020   LDLDIRECT 146.4 01/30/2010   TRIG 128 12/15/2020   CHOLHDL 2.5 12/15/2020  -Continues Crestor 40 and omega-3 fatty acids without side effects  3.  Obesity class 1 -She continues SGLT2 inhibitor, which should also help with weight loss, but unfortunately we could not use a GLP-1 receptor agonist due to previous intolerance -Weight was stable at last visit - she gained 2 lbs since then  Component     Latest Ref Rng & Units 01/26/2021  Fructosamine     205 - 285 umol/L 345  (H)  HbA1c calculated from fructosamine is 7.47%, higher.  Philemon Kingdom, MD PhD [facility] Endocrinology

## 2021-01-26 NOTE — Patient Instructions (Signed)
Please  continue: - Farxiga 10 mg daily before b'fast - Tresiba 40 units daily - Novolog: 10-14 units 2x day before meals 4-5 units for correction of a blood sugars >200   Check sugars 2-3x a day before meals.  Please return in 3-4 months with your sugar log.

## 2021-01-27 ENCOUNTER — Other Ambulatory Visit: Payer: Self-pay | Admitting: Internal Medicine

## 2021-01-29 LAB — FRUCTOSAMINE: Fructosamine: 345 umol/L — ABNORMAL HIGH (ref 205–285)

## 2021-02-09 ENCOUNTER — Other Ambulatory Visit: Payer: Self-pay | Admitting: Thoracic Surgery (Cardiothoracic Vascular Surgery)

## 2021-02-09 DIAGNOSIS — R911 Solitary pulmonary nodule: Secondary | ICD-10-CM

## 2021-02-10 ENCOUNTER — Other Ambulatory Visit: Payer: Self-pay | Admitting: Adult Health

## 2021-02-10 DIAGNOSIS — F411 Generalized anxiety disorder: Secondary | ICD-10-CM

## 2021-02-23 ENCOUNTER — Telehealth: Payer: Self-pay | Admitting: Internal Medicine

## 2021-02-23 NOTE — Chronic Care Management (AMB) (Signed)
  Chronic Care Management   Note  02/23/2021 Name: [name] MRN: [id number] DOB: 06-24-1946  [name] is a 75 y.o. year old female who is a primary care patient of [name], MD. I reached out to Lacy Duverney by phone today in response to a referral sent by Ms. Sarina Ser Wall's PCP, [name], MD.   Ms. Westergren was given information about Chronic Care Management services today including:  1. CCM service includes personalized support from designated clinical staff supervised by her physician, including individualized plan of care and coordination with other care providers 2. 24/7 contact phone numbers for assistance for urgent and routine care needs. 3. Service will only be billed when office clinical staff spend 20 minutes or more in a month to coordinate care. 4. Only one practitioner may furnish and bill the service in a calendar month. 5. The patient may stop CCM services at any time (effective at the end of the month) by phone call to the office staff.   Patient agreed to services and verbal consent obtained.   Follow up plan:   Tatjana Secretary/administrator

## 2021-03-02 DIAGNOSIS — H524 Presbyopia: Secondary | ICD-10-CM | POA: Diagnosis not present

## 2021-03-02 DIAGNOSIS — E113293 Type 2 diabetes mellitus with mild nonproliferative diabetic retinopathy without macular edema, bilateral: Secondary | ICD-10-CM | POA: Diagnosis not present

## 2021-03-02 DIAGNOSIS — H538 Other visual disturbances: Secondary | ICD-10-CM | POA: Diagnosis not present

## 2021-03-02 DIAGNOSIS — H40023 Open angle with borderline findings, high risk, bilateral: Secondary | ICD-10-CM | POA: Diagnosis not present

## 2021-03-02 LAB — HM DIABETES EYE EXAM

## 2021-03-11 ENCOUNTER — Other Ambulatory Visit: Payer: Self-pay | Admitting: Adult Health

## 2021-03-11 DIAGNOSIS — F411 Generalized anxiety disorder: Secondary | ICD-10-CM

## 2021-03-13 ENCOUNTER — Ambulatory Visit: Payer: Medicare Other | Admitting: Thoracic Surgery (Cardiothoracic Vascular Surgery)

## 2021-03-13 ENCOUNTER — Other Ambulatory Visit: Payer: Medicare Other

## 2021-03-20 ENCOUNTER — Ambulatory Visit: Payer: Medicare Other | Admitting: Thoracic Surgery (Cardiothoracic Vascular Surgery)

## 2021-03-20 ENCOUNTER — Encounter: Payer: Self-pay | Admitting: Internal Medicine

## 2021-03-31 ENCOUNTER — Ambulatory Visit: Payer: Medicare Other | Admitting: Pharmacist

## 2021-04-05 ENCOUNTER — Other Ambulatory Visit: Payer: Self-pay | Admitting: Internal Medicine

## 2021-04-05 DIAGNOSIS — E1122 Type 2 diabetes mellitus with diabetic chronic kidney disease: Secondary | ICD-10-CM

## 2021-04-05 DIAGNOSIS — Z794 Long term (current) use of insulin: Secondary | ICD-10-CM

## 2021-04-05 DIAGNOSIS — N184 Chronic kidney disease, stage 4 (severe): Secondary | ICD-10-CM

## 2021-04-08 ENCOUNTER — Other Ambulatory Visit: Payer: Self-pay | Admitting: Adult Health

## 2021-04-08 DIAGNOSIS — F411 Generalized anxiety disorder: Secondary | ICD-10-CM

## 2021-04-13 ENCOUNTER — Encounter: Payer: Self-pay | Admitting: Podiatry

## 2021-04-13 ENCOUNTER — Other Ambulatory Visit: Payer: Self-pay

## 2021-04-13 ENCOUNTER — Ambulatory Visit (INDEPENDENT_AMBULATORY_CARE_PROVIDER_SITE_OTHER): Payer: Medicare Other | Admitting: Podiatry

## 2021-04-13 DIAGNOSIS — B351 Tinea unguium: Secondary | ICD-10-CM

## 2021-04-13 DIAGNOSIS — Z794 Long term (current) use of insulin: Secondary | ICD-10-CM

## 2021-04-13 DIAGNOSIS — M79674 Pain in right toe(s): Secondary | ICD-10-CM | POA: Diagnosis not present

## 2021-04-13 DIAGNOSIS — Q828 Other specified congenital malformations of skin: Secondary | ICD-10-CM

## 2021-04-13 DIAGNOSIS — E1159 Type 2 diabetes mellitus with other circulatory complications: Secondary | ICD-10-CM

## 2021-04-13 DIAGNOSIS — M79675 Pain in left toe(s): Secondary | ICD-10-CM | POA: Diagnosis not present

## 2021-04-13 NOTE — Progress Notes (Signed)
Subjective:  Patient ID: [name], female    DOB: Jun 16, 1946,  MRN: [id number]  75 y.o. female presents with at risk foot care. Pt has h/o NIDDM with PAD and painful thick toenails that are difficult to trim. Pain interferes with ambulation. Aggravating factors include wearing enclosed shoe gear. Pain is relieved with periodic professional debridement..    Patient did not check blood glucose this morning.  PCP: Karen Pinto, MD and last visit was: 06/16/2020.  She also sees Dr. Philemon [name], Endocrinology, for her diabetes and last visit was 01/26/2021.  She is followed closely by [facility] due to PAD. She has h/o claudication. She underwent stenting of her left superficial femoral artery in 2012, and it is occluded. She last saw Dr. Harold [name] on 12/01/2020 and is to follow up in one year, with understanding if she develops any lower extremity wounds to contact his office immediately.  Today, she states right hallux nail feels ingrown and is more painful when wearing enclosed shoe gear. She denies any redness, drainage or swelling of digit.   Review of Systems: Negative except as noted in the HPI.   Allergies  Allergen Reactions   Ciprofloxacin Other (See Comments)   Ozempic (0.25 Or 0.5 Mg-Dose) [Semaglutide(0.25 Or 0.5mg -Dos)] Nausea And Vomiting   Plavix [Clopidogrel Bisulfate] Palpitations   Amoxicillin Itching and Swelling    FACE & EYES SWELL   Ace Inhibitors    Ciprofloxacin Hcl Swelling   Lyrica [Pregabalin]     Itch, gain weight   Penicillins Other (See Comments)    REACTION: unspecified    Objective:  There were no vitals filed for this visit. Constitutional Patient is a pleasant 75 y.o. African American female WD, WN in NAD. AAO x 3.  Vascular Capillary refill time to digits immediate b/l. Nonpalpable pedal pulse(s) b/l lower extremities. Pedal hair absent. Lower extremity skin temperature gradient within normal limits. No edema noted  b/l lower extremities. No ischemia or gangrene noted b/l lower extremities. No cyanosis or clubbing noted.  Neurologic Normal speech. Protective sensation intact 5/5 intact bilaterally with 10g monofilament b/l. Vibratory sensation intact b/l.  Dermatologic Pedal skin is thin shiny, atrophic b/l lower extremities. Toenails 1-5 b/l elongated, discolored, dystrophic, thickened, crumbly with subungual debris and tenderness to dorsal palpation. Incurvated nailplate medial border(s) R hallux.  Nail border hypertrophy minimal. There is tenderness to palpation. Sign(s) of infection: no clinical signs of infection noted on examination today. Porokeratotic lesion(s) submet head 3 left foot. No erythema, no edema, no drainage, no fluctuance.  Orthopedic: Normal muscle strength 5/5 to all lower extremity muscle groups bilaterally. No pain crepitus or joint limitation noted with ROM b/l. Wearing Skechers sandals with adequate shock absorption due to memory foam sole.   Hemoglobin A1C Latest Ref Rng & Units 12/15/2020 09/22/2020 05/21/2020  HGBA1C <5.7 % of total Hgb 8.4(H) 8.7(A) 9.6(A)  Some recent data might be hidden    ABIs from 10/03/2020 (has occluded stent of superfiical femoral artery LLE):  ABIs: Right=0.45 Left:=0.46  TBIs: Right=0.19 Left=0.29  Assessment:   1. Pain due to onychomycosis of toenails of both feet   2. Porokeratosis   3. Type 2 diabetes mellitus with other circulatory complication, with long-term current use of insulin ([name])    Plan:  Patient was evaluated and treated and all questions answered. No limb threatening issues evident today.  Onychomycosis with pain -Nails palliatively debridement as below. -Educated on self-care  Procedure: Nail Debridement Rationale: Pain Type of Debridement:  manual, sharp debridement. Instrumentation: Nail nipper, rotary burr. Number of Nails: 10  -Examined patient. -We discussed treatment options for onychomycosis: topical, oral and  laser. She will start applying tea tree oil to nails once daily and we will reassess at next visit. -Continue diabetic foot care principles. -Patient to continue soft, supportive shoe gear daily. -Toenails 1-5 b/l were debrided in length and girth with sterile nail nippers and dremel without iatrogenic bleeding.  -Painful porokeratotic lesion(s) submet head 3 left foot pared and enucleated with sterile scalpel blade without incident. Total number of lesions debrided=1. -Patient to report any pedal injuries to medical professional immediately. -Patient/POA to call should there be question/concern in the interim.  Return in about 3 months (around 07/14/2021).  Marzetta Board, DPM

## 2021-04-21 ENCOUNTER — Encounter: Payer: Self-pay | Admitting: Internal Medicine

## 2021-05-02 ENCOUNTER — Other Ambulatory Visit: Payer: Self-pay | Admitting: Adult Health

## 2021-05-04 ENCOUNTER — Other Ambulatory Visit: Payer: Self-pay | Admitting: Adult Health

## 2021-05-04 DIAGNOSIS — F411 Generalized anxiety disorder: Secondary | ICD-10-CM

## 2021-05-05 ENCOUNTER — Other Ambulatory Visit: Payer: Self-pay | Admitting: Adult Health

## 2021-05-05 DIAGNOSIS — F419 Anxiety disorder, unspecified: Secondary | ICD-10-CM

## 2021-05-05 MED ORDER — TRAZODONE HCL 50 MG PO TABS: ORAL_TABLET | ORAL | 0 refills | Status: DC

## 2021-05-16 ENCOUNTER — Other Ambulatory Visit: Payer: Self-pay | Admitting: Internal Medicine

## 2021-05-16 DIAGNOSIS — I10 Essential (primary) hypertension: Secondary | ICD-10-CM

## 2021-05-18 DIAGNOSIS — N184 Chronic kidney disease, stage 4 (severe): Secondary | ICD-10-CM | POA: Diagnosis not present

## 2021-05-18 DIAGNOSIS — I129 Hypertensive chronic kidney disease with stage 1 through stage 4 chronic kidney disease, or unspecified chronic kidney disease: Secondary | ICD-10-CM | POA: Diagnosis not present

## 2021-05-18 DIAGNOSIS — N2581 Secondary hyperparathyroidism of renal origin: Secondary | ICD-10-CM | POA: Diagnosis not present

## 2021-05-18 DIAGNOSIS — R809 Proteinuria, unspecified: Secondary | ICD-10-CM | POA: Diagnosis not present

## 2021-05-18 DIAGNOSIS — D631 Anemia in chronic kidney disease: Secondary | ICD-10-CM | POA: Diagnosis not present

## 2021-05-18 DIAGNOSIS — E1122 Type 2 diabetes mellitus with diabetic chronic kidney disease: Secondary | ICD-10-CM | POA: Diagnosis not present

## 2021-05-27 ENCOUNTER — Other Ambulatory Visit: Payer: Self-pay | Admitting: Adult Health

## 2021-06-01 ENCOUNTER — Ambulatory Visit: Payer: Medicare Other | Admitting: Internal Medicine

## 2021-06-08 ENCOUNTER — Encounter: Payer: Self-pay | Admitting: Internal Medicine

## 2021-06-08 ENCOUNTER — Ambulatory Visit (INDEPENDENT_AMBULATORY_CARE_PROVIDER_SITE_OTHER): Payer: Medicare Other | Admitting: Internal Medicine

## 2021-06-08 ENCOUNTER — Other Ambulatory Visit: Payer: Self-pay

## 2021-06-08 VITALS — BP 118/60 | HR 67 | Ht 66.0 in | Wt 185.6 lb

## 2021-06-08 DIAGNOSIS — E1169 Type 2 diabetes mellitus with other specified complication: Secondary | ICD-10-CM

## 2021-06-08 DIAGNOSIS — N184 Chronic kidney disease, stage 4 (severe): Secondary | ICD-10-CM

## 2021-06-08 DIAGNOSIS — I779 Disorder of arteries and arterioles, unspecified: Secondary | ICD-10-CM

## 2021-06-08 DIAGNOSIS — Z794 Long term (current) use of insulin: Secondary | ICD-10-CM | POA: Diagnosis not present

## 2021-06-08 DIAGNOSIS — E1122 Type 2 diabetes mellitus with diabetic chronic kidney disease: Secondary | ICD-10-CM

## 2021-06-08 DIAGNOSIS — E669 Obesity, unspecified: Secondary | ICD-10-CM

## 2021-06-08 DIAGNOSIS — E785 Hyperlipidemia, unspecified: Secondary | ICD-10-CM

## 2021-06-08 LAB — POCT GLYCOSYLATED HEMOGLOBIN (HGB A1C): Hemoglobin A1C: 8.7 % — AB (ref 4.0–5.6)

## 2021-06-08 MED ORDER — ONETOUCH VERIO W/DEVICE KIT: PACK | 0 refills | Status: DC

## 2021-06-08 MED ORDER — NOVOLOG FLEXPEN 100 UNIT/ML ~~LOC~~ SOPN: 10.0000 [IU] | PEN_INJECTOR | Freq: Three times a day (TID) | SUBCUTANEOUS | 3 refills | Status: DC

## 2021-06-08 MED ORDER — ONETOUCH DELICA LANCETS 33G MISC: 3 refills | Status: DC

## 2021-06-08 MED ORDER — GLUCOSE BLOOD VI STRP: ORAL_STRIP | 12 refills | Status: DC

## 2021-06-08 MED ORDER — TRESIBA FLEXTOUCH 200 UNIT/ML ~~LOC~~ SOPN: 40.0000 [IU] | PEN_INJECTOR | Freq: Every day | SUBCUTANEOUS | 1 refills | Status: DC

## 2021-06-08 NOTE — Progress Notes (Signed)
Patient ID: [name], female   DOB: August 20, 1946, 75 y.o.   MRN: [id number]   This visit occurred during the SARS-CoV-2 public health emergency.  Safety protocols were in place, including screening questions prior to the visit, additional usage of staff PPE, and extensive cleaning of exam room while observing appropriate contact time as indicated for disinfecting solutions.   HPI: [name] is a 75 y.o.-year-old female, initially referred by her cardiologist, Dr.Hochrein, presenting for follow-up for DM2, dx in ~2000, insulin-dependent since ~2005, uncontrolled, with complications (PVD - s/p stents and fem-pop bypass; h/o CVA x3; CKD stage 3b; DR; PN). She saw my colleague, Dr. Loanne Drilling many years ago.  Last visit with me 6 months ago. PCP: [name], PA  Interim history: No increased urination, blurry vision, nausea, diarrhea, constipation, chest pain.  Reviewed HbA1c levels: 01/26/2021: HbA1c calculated from fructosamine is 7.47%, higher. Lab Results  Component Value Date   HGBA1C 8.4 (H) 12/15/2020   HGBA1C 8.7 (A) 09/22/2020   HGBA1C 9.6 (A) 05/21/2020   HGBA1C 9.8 (A) 01/14/2020   HGBA1C 9.1 (A) 07/09/2019   HGBA1C 10.9 (H) 11/13/2018   HGBA1C 9.7 (H) 07/19/2018   HGBA1C 8.7 (H) 04/18/2018   HGBA1C 8.3 (H) 01/03/2018   HGBA1C 10.0 (H) 09/27/2017  09/22/2020: HbA1c calculated from fructosamine is much better than the directly measured HbA1c, at 6.25% 05/21/2020: HbA1c calculated from fructosamine 7.3%, much better than the directly measured one, and stable from before 01/14/2020: HbA1c calculated from fructosamine 7.3% 10/08/2019: HbA1c calculated from fructosamine: 6.9%  She was previously on: - Novolog 70/30 35 units before breakfast and 30 units before dinner- injecting hips and arms due to abdominal pain - Farxiga 5 mg in am - started 06/2019 >> 10 mg daily - increased 09/2019  Currently on: - Farxiga 10 mg daily before b'fast - Tresiba 40 units daily -  Novolog: 10-14 >> but actually using 8 units (forgot instructions) 2x day before meals 4-5 units for correction of a blood sugars >200  She could not tolerate Ozempic (started 11/2018) due to nausea, vomiting, abdominal pain-stopped 04/2019. She was on Invokana >> intolerance 2/2 weight loss and dehydration >> hospitalization. She was on Metformin and sulfonylurea at diagnosis. She was also on Januvia at the same time with Invokana >> stopped along with Invokana   Pt was checking sugars 1x a day but ran out of strips for her Livongo meter - not covered - not checking now. From last OV: - am: 88, 102-210, 253, 283 >> 112-218 >> 121-140  - 2h after b'fast: 326 >> n/c >> 326 (10/2019) >> n/c - before lunch: 265 >> 124-172 >> n/c - 2h after lunch: 280, 286 >> n/c - before dinner: 68-157 >> 73-193, 215 >> n/c >> 180 (doughnuts) - 2h after dinner: 247 >> 196- 201 >> 172 >> n/c  - bedtime: n/c - nighttime: n/c >> 68  - seldom Lowest sugar was 67 >> 64 x1, 84 >> 68 >> ?; she has hypoglycemia awareness at 80. Highest sugar was 335 (no medications) >> 218 >> 180 >. ?.  Glucometer: Livongo  Pt's meals are: - Breakfast: eggs or cheese half a sandwich - Lunch: half a sandwich, apple - Dinner 9-9:30 pm: chicken, veggies - Snacks: stopped cheese; now apples and pickles, bedtime snack: orange or apple  She works part-time, from 10 am - 2 pm Tue-Fri.   -+ CKD stage IIIb (Dr. Candiss Norse), last BUN/creatinine:  05/2021: improved reportedly Lab Results  Component Value  Date   BUN 25 12/15/2020   BUN 24 (H) 10/28/2020   CREATININE 1.78 (H) 12/15/2020   CREATININE 1.86 (H) 10/28/2020   Lab Results  Component Value Date   GFRAA 32 (L) 12/15/2020   GFRAA 25 (L) 08/04/2020   GFRAA 23 (L) 07/31/2020   GFRAA 22 (L) 07/30/2020   GFRAA 31 (L) 10/18/2019  On olmesartan 40.  -+ HL; last set of lipids: Lab Results  Component Value Date   CHOL 130 12/15/2020   HDL 51 12/15/2020   LDLCALC 58 12/15/2020    LDLDIRECT 146.4 01/30/2010   TRIG 128 12/15/2020   CHOLHDL 2.5 12/15/2020  On Crestor 40, omega-3 fatty acids 1200 mg daily.  - last eye exam was: 02/2021: + DR She also has glaucoma.  -+ Numbness and tingling in her feet.  She has a callus on the left foot.  She sees a podiatrist.  She did not tolerate Lyrica.  On ASA 81.  Pt has FH of DM in sister.  She also has a history of nontoxic multinodular goiter.   Latest TSH was reviewed and this was normal: Lab Results  Component Value Date   TSH 1.66 12/15/2020   She also has a history of HTN, brain aneurysm, emphysema, esophageal stricture, GERD.  She had hernia repair surgery- had mesh placed >> persistent abdominal pain.  Her husband's nephew, Modena Slater, was also my patient and he died at the beginning of 12/11/2019 after he stopped going to dialysis.  ROS: + see HPI  I reviewed pt's medications, allergies, PMH, social hx, family hx, and changes were documented in the history of present illness. Otherwise, unchanged from my initial visit note.  Past Medical History:  Diagnosis Date   Abnormal findings on esophagogastroduodenoscopy (EGD) 07/2010   Aneurysm ([place]) 12-11-11   Right Brain    Aneurysm ([place])    in brain x 2   Anxiety    Arthritis    Cataract    Chronic kidney disease    CKD (chronic kidney disease), stage IV ([place])    Colon polyps    Depression    Diabetes mellitus 1998   Diverticulosis    Emphysema of lung ([place])    ESOPHAGEAL STRICTURE 08/27/2008   GERD (gastroesophageal reflux disease)    Hemorrhoids    Hiatal hernia    Hyperlipidemia    Hypertension 2000   Migraines    Peripheral vascular disease (HCC)    Phlebitis    30 years ago  left leg   Stroke [facility])    multiple mini strokes ( brain aneurysm )   Past Surgical History:  Procedure Laterality Date   ABDOMINAL AORTAGRAM N/A 01/04/2012   Procedure: ABDOMINAL Maxcine Ham;  Surgeon: Serafina Mitchell, MD;  Location: [facility] CATH LAB;  Service: Cardiovascular;   Laterality: N/A;   ABDOMINAL AORTAGRAM N/A 08/15/2012   Procedure: ABDOMINAL Maxcine Ham;  Surgeon: Serafina Mitchell, MD;  Location: [facility] CATH LAB;  Service: Cardiovascular;  Laterality: N/A;   ABDOMINAL HYSTERECTOMY  Dec 10, 1982   cataract surgery Right 10-22-2015   cataract surgery Left 11-05-2015   CESAREAN SECTION     CHOLECYSTECTOMY     COLONOSCOPY  07/2010   DENTAL SURGERY  Aug. 16, 2013   left lower    ESOPHAGOGASTRODUODENOSCOPY  07/2010   EYE SURGERY  Nov. 2014   Laser-Glaucoma   FEMORAL ARTERY STENT  05/11/11   Left superficial femoral and popliteal artery   HERNIA REPAIR     times two   KNEE  SURGERY     LOWER EXTREMITY ANGIOGRAM Left 08/15/2012   Procedure: LOWER EXTREMITY ANGIOGRAM;  Surgeon: Serafina Mitchell, MD;  Location: [facility] CATH LAB;  Service: Cardiovascular;  Laterality: Left;  lt leg angio   rotator cuff surgery     THYROID SURGERY     Social History   Socioeconomic History   Marital status: Married    Spouse name: Not on file   Number of children: 2   Years of education: Not on file   Highest education level: Not on file  Occupational History   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  Tobacco Use   Smoking status: Light Smoker    Packs/day: 0.50    Years: 40.00    Pack years: 20.00    Types: Cigarettes   Smokeless tobacco: Never   Tobacco comments:    11/18/20 in process of quiting  Vaping Use   Vaping Use: Never used  Substance and Sexual Activity   Alcohol use: No    Alcohol/week: 0.0 standard drinks   Drug use: No   Sexual activity: Not on file  Other Topics Concern   Not on file  Social History Narrative   Lives with husband.        Social Determinants of Health   Financial Resource Strain: Not on file  Food Insecurity: Not on file  Transportation Needs: Not on file  Physical Activity: Not on file  Stress: Not on file  Social Connections: Not on file  Intimate Partner Violence: Not on file   Current Outpatient Medications on File  Prior to Visit  Medication Sig Dispense Refill   albuterol (VENTOLIN HFA) 108 (90 Base) MCG/ACT inhaler Inhale 2 puffs into the lungs every 4 (four) hours as needed for wheezing or shortness of breath. (Patient not taking: Reported on 12/15/2020) 18 g 11   amLODipine (NORVASC) 10 MG tablet TAKE 1 TABLET DAILY FOR BLOOD PRESSURE 90 tablet 3   aspirin 325 MG EC tablet Take 325 mg by mouth daily.     b complex vitamins capsule Take 1 capsule by mouth daily.     Blood Glucose Monitoring Suppl (ACCU-CHEK AVIVA PLUS) W/DEVICE KIT Check blood sugar up to three times daily 1 kit 0   chlorthalidone (HYGROTON) 25 MG tablet Take 12.5 mg by mouth daily.     Cholecalciferol (VITAMIN D PO) Take 5,000 Units by mouth daily.     CINNAMON PO Take 1,000 mg by mouth 2 (two) times daily.      citalopram (CELEXA) 40 MG tablet TAKE 1 TABLET DAILY FOR MOOD & ANXIETY 90 tablet 3   clonazePAM (KLONOPIN) 0.5 MG tablet TAKE 1/2 - 1 TABLET AT BEDTIME ONLY IF NEEDED FORSLEEP & LIMIT TO 5 DAYS /WEEK TO AVOID ADDICTION & DEMENTIA. SCRIPT SHOULD LAST LONGER THAN 30 DAYS 25 tablet 0   FARXIGA 10 MG TABS tablet TAKE 10 MG BY MOUTH DAILY BEFORE BREAKFAST. 90 tablet 3   glucose blood (ACCU-CHEK AVIVA PLUS) test strip Use daily to check BS TID Dx. E11.22 300 each 1   insulin aspart (NOVOLOG FLEXPEN) 100 UNIT/ML FlexPen Inject 14 Units into the skin 3 (three) times daily with meals. 15 mL 2   insulin degludec (TRESIBA FLEXTOUCH) 200 UNIT/ML FlexTouch Pen Inject 34 Units into the skin daily. INJECT 34 UNITS DAILY UNDER SKIN 18 mL 1   Insulin Pen Needle 29G X 5MM MISC Use with insulin pen 3 times a day. 300 each 11   Magnesium 500 MG TABS  Take 500 mg by mouth daily.     olmesartan (BENICAR) 40 MG tablet TAKE 1 TABLET DAILY FOR BLOOD PRESSURE 90 tablet 3   Omega-3 Fatty Acids (FISH OIL) 1200 MG CAPS Take 1,200 capsules by mouth daily.      omeprazole (PRILOSEC) 40 MG capsule Take 1 capsule (40 mg total) by mouth in the morning and at  bedtime. 180 capsule 3   OVER THE COUNTER MEDICATION Take 1 tablet by mouth daily. One a day vitamin Woman's one a day     rosuvastatin (CRESTOR) 40 MG tablet Take 1 tablet Daily for Cholesterol 90 tablet 1   Tiotropium Bromide Monohydrate (SPIRIVA RESPIMAT) 2.5 MCG/ACT AERS Inhale 1 puff into the lungs daily. 4 g 11   traZODone (DESYREL) 50 MG tablet 1/2-1 TABLET AS NEEDED 1 HOUR PRIOR TO BEDTIME FOR SLEEP. 90 tablet 1   vitamin E 400 UNIT capsule Take 400 Units by mouth daily.     No current facility-administered medications on file prior to visit.   Allergies  Allergen Reactions   Ciprofloxacin Other (See Comments)   Ozempic (0.25 Or 0.5 Mg-Dose) [Semaglutide(0.25 Or 0.93m-Dos)] Nausea And Vomiting   Plavix [Clopidogrel Bisulfate] Palpitations   Amoxicillin Itching and Swelling    FACE & EYES SWELL   Ace Inhibitors    Ciprofloxacin Hcl Swelling   Lyrica [Pregabalin]     Itch, gain weight   Penicillins Other (See Comments)    REACTION: unspecified   Family History  Problem Relation Age of Onset   CAD Mother 679      Died of MI   Hypertension Mother    Heart attack Mother    Heart disease Mother    CAD Father 654      Died of MI   Heart disease Father    Heart attack Father    CAD Brother 618      Two brothers died of MI   Heart attack Brother    Heart disease Brother        Amputation   Diabetes Sister    Hypertension Sister    Heart attack Brother    Heart disease Brother    Stroke Sister    Colon cancer Neg Hx    Stomach cancer Neg Hx    Esophageal cancer Neg Hx     PE: BP 118/60 (BP Location: Left Arm, Patient Position: Sitting, Cuff Size: Normal)   Pulse 67   Ht 5' 6"  (1.676 m)   Wt 185 lb 9.6 oz (84.2 kg)   SpO2 94%   BMI 29.96 kg/m  Wt Readings from Last 3 Encounters:  06/08/21 185 lb 9.6 oz (84.2 kg)  01/26/21 191 lb 9.6 oz (86.9 kg)  12/15/20 189 lb (85.7 kg)   Constitutional: overweight, in NAD Eyes: PERRLA, EOMI, no exophthalmos ENT: moist  mucous membranes, no thyromegaly, no cervical lymphadenopathy Cardiovascular: RRR, No MRG Respiratory: CTA B Gastrointestinal: abdomen soft, NT, ND, BS+ Musculoskeletal: no deformities, strength intact in all 4 Skin: moist, warm, no rashes Neurological: no tremor with outstretched hands, DTR normal in all 4  ASSESSMENT: 1. DM2, insulin-dependent, uncontrolled, with complications - PVD - s/p stents and fem-pop bypass - h/o CVA - CKD stage 3b - DR - PN  Her antipancreatic antibodies were negative: Component     Latest Ref Rng & Units 07/22/2016  Glutamic Acid Decarb Ab     <5 IU/mL <5   2.  Hyperlipidemia  3.  Obesity class 1  PLAN:  1. Patient with longstanding, uncontrolled, type 2 diabetes, previously on a premixed insulin regimen and To taking insulin, currently on a basal-bolus insulin regimen along with SGLT2 inhibitor.  We did not use Invokana for her due to history of severe PVD and also previous intolerance with severe dehydration, but she tolerates [place] well.  She could not tolerate the GLP-1 receptor agonist (Ozempic), due to GI symptoms, and she refused to try Trulicity.  Also, she refused to try the V-Go patch pump.  At last visit, sugars are better in the morning, mostly at goal or slightly above goal but she was not checking at all later in the day and I again advised her to start.  She was occasionally using NovoLog for correcting high blood sugars.  HbA1c calculated from fructosamine was 7.47%, higher than before. -At today's visit, she is not checking sugars at all as she cannot afford her Livongo test strips.  I called in a prescription for One Touch verio meter and testing supplies for her and strongly advised her to restart checking 2-3 times a day.  Also, she did not remember that we increase the NovoLog doses to 10-14 units before meals so she is now taking only 8 units.  Without more data to go by.  For now, I advised her to increase the doses to 10-14 units  depending on the size of the meals and start checking blood sugars.  I encouraged her to be more flexible in adjusting the doses.  For now, we will not change the [place] doses. - I suggested to:  Patient Instructions  Please  continue: - Farxiga 10 mg daily before b'fast - Tresiba 40 units daily  Please increase: - Novolog: 10-14 units 2x day before meals 4-5 units for correction of a blood sugars >200   Try to get the One Touch Verio meter and check sugars 2-3x a day.  Please return in 3 months with your sugar log.    - we checked her HbA1c: 8.7% (higher) - advised to check sugars at different times of the day - 3-4x a day, rotating check times - advised for yearly eye exams >> she is UTD - return to clinic in 3 months  2. HL -Reviewed latest lipid panel from 11/2020: All fractions at goal Lab Results  Component Value Date   CHOL 130 12/15/2020   HDL 51 12/15/2020   LDLCALC 58 12/15/2020   LDLDIRECT 146.4 01/30/2010   TRIG 128 12/15/2020   CHOLHDL 2.5 12/15/2020  -Continues Crestor 40 mg daily and omega-3 fatty acids 1200 mg daily without side effects  3.  Obesity class 1 -She continues on SGLT2 inhibitor which should also help with weight loss but unfortunately we cannot use a GLP-1 receptor agonist due to previous intolerance -Weight was relatively stable at last visit -lost 6 lbs since last OV  Philemon Kingdom, MD PhD [facility] Endocrinology

## 2021-06-08 NOTE — Patient Instructions (Addendum)
Please  continue: - Farxiga 10 mg daily before b'fast - Tresiba 40 units daily  Please increase: - Novolog: 10-14 units 2x day before meals 4-5 units for correction of a blood sugars >200   Try to get the One Touch Verio meter and check sugars 2-3x a day.  Please return in 3 months with your sugar log.

## 2021-06-16 ENCOUNTER — Telehealth: Payer: Self-pay | Admitting: *Deleted

## 2021-06-16 DIAGNOSIS — Z006 Encounter for examination for normal comparison and control in clinical research program: Secondary | ICD-10-CM

## 2021-06-16 NOTE — Telephone Encounter (Signed)
I called patient to sign medical release form for [employer] Study. I will mail the form to patient and she can return it in postage paid envelope back to [place]. We have followed the patient every 3 months for 1 year and [facility] will follow up to 5 years. I will enclose copy of original consent so patient can review.

## 2021-06-23 ENCOUNTER — Other Ambulatory Visit: Payer: Self-pay | Admitting: Adult Health

## 2021-06-29 ENCOUNTER — Encounter: Payer: Medicare Other | Admitting: Internal Medicine

## 2021-07-03 ENCOUNTER — Other Ambulatory Visit: Payer: Self-pay | Admitting: Adult Health

## 2021-07-03 DIAGNOSIS — F411 Generalized anxiety disorder: Secondary | ICD-10-CM

## 2021-07-07 ENCOUNTER — Other Ambulatory Visit: Payer: Self-pay | Admitting: Adult Health Nurse Practitioner

## 2021-07-07 DIAGNOSIS — F419 Anxiety disorder, unspecified: Secondary | ICD-10-CM

## 2021-07-13 ENCOUNTER — Ambulatory Visit: Payer: Medicare Other | Admitting: Podiatry

## 2021-07-13 DIAGNOSIS — N184 Chronic kidney disease, stage 4 (severe): Secondary | ICD-10-CM | POA: Diagnosis not present

## 2021-07-18 ENCOUNTER — Other Ambulatory Visit: Payer: Self-pay | Admitting: Internal Medicine

## 2021-07-21 DIAGNOSIS — N184 Chronic kidney disease, stage 4 (severe): Secondary | ICD-10-CM | POA: Diagnosis not present

## 2021-07-21 DIAGNOSIS — E1122 Type 2 diabetes mellitus with diabetic chronic kidney disease: Secondary | ICD-10-CM | POA: Diagnosis not present

## 2021-07-21 DIAGNOSIS — N2581 Secondary hyperparathyroidism of renal origin: Secondary | ICD-10-CM | POA: Diagnosis not present

## 2021-07-21 DIAGNOSIS — I129 Hypertensive chronic kidney disease with stage 1 through stage 4 chronic kidney disease, or unspecified chronic kidney disease: Secondary | ICD-10-CM | POA: Diagnosis not present

## 2021-07-21 DIAGNOSIS — D631 Anemia in chronic kidney disease: Secondary | ICD-10-CM | POA: Diagnosis not present

## 2021-07-27 ENCOUNTER — Other Ambulatory Visit: Payer: Self-pay | Admitting: Internal Medicine

## 2021-07-29 ENCOUNTER — Other Ambulatory Visit: Payer: Self-pay | Admitting: Adult Health

## 2021-07-29 DIAGNOSIS — F411 Generalized anxiety disorder: Secondary | ICD-10-CM

## 2021-07-30 NOTE — Progress Notes (Signed)
COMPLETE PHYSICAL   Assessment:    [name] was seen today for annual exam.  Diagnoses and all orders for this visit:  Encounter for routine adult medical exam with abnormal findings Yearly  Essential hypertension Continue current medications: amlodpine 49m,  olmesartan 46mdaily. Monitor blood pressure at home; call if consistently over 130/80 Continue DASH diet.   Reminder to go to the ER if any CP, SOB, nausea, dizziness, severe HA, changes vision/speech, left arm numbness and tingling and jaw pain. -     CBC with Differential/Platelet -     COMPLETE METABOLIC PANEL WITH GFR -     TSH -     Magnesium -     EKG 12-Lead  Hyperlipidemia associated with type 2 diabetes mellitus (HCC) Continue medications:rosuvastatin 1017miscussed dietary and exercise modifications Low fat diet -     Lipid panel  . Anxiety state Continue to monitor  Peripheral vascular disease due to secondary diabetes mellitus (HCC) Control blood pressure, lipids and glucose Disscused lifestyle modifications, diet & exercise Continue to monitor  PAOD (peripheral arterial occlusive disease) (HCC) Increase activity  Type 2 diabetes mellitus with stage 4 chronic kidney disease, with long-term current use of insulin (HCC) Continue medications follow s with Endocrinology Discussed general issues about diabetes pathophysiology and management. Education: Reviewed 'ABCs' of diabetes management (respective goals in parentheses):  A1C (<7), blood pressure (<130/80), and cholesterol (LDL <70) Dietary recommendations Encouraged aerobic exercise.  Discussed foot care, check daily Yearly retinal exam Dental exam every 6 months Monitor blood glucose, discussed goal for patient   History of CVA (cerebrovascular accident) Control blood pressure, lipids and glucose Disscused lifestyle modifications, diet & exercise Continue to monitor  Vitamin D deficiency -     VITAMIN D 25 Hydroxy (Vit-D Deficiency,  Fractures)  CKD stage 4 due to type 2 diabetes mellitus (HCC) Increase fluids, have discussed this at length with patient Avoid NSAIDS Blood pressure control Monitor sugars, needs better glucose control Continue to follow with  [place] related to continually decline in kidney function -     Microalbumin / creatinine urine ratio -     Urinalysis, Routine w reflex microscopic  Atherosclerosis of aorta (HCCEtheteer CT 06/19/20 Control blood pressure, lipids and glucose Disscused lifestyle modifications, diet & exercise Continue to monitor  Depression, major, recurrent, in partial remission (HCC) Discussed stress management techniques  Discussed, increase water,intake & good sleep hygiene  Discussed increasing exercise & vegetables in diet  Neuropathy due to secondary diabetes (HCC) Control glucose  Medication management -Continued  Pulmonary emphysema, unspecified emphysema type (HCC) Currently not on inhalers and symptoms are currently controlled  History of CVA (cerebrovascular accident) Control blood pressure, cholesterol, glucose, increase exercise.   Nontoxic multinodular goiter Monitor  Restless leg syndrome [name] Clonazepam many years ago. Clonazepam was stopped and symptoms have worsened Ropinirole 0.36m90mnight  Insomnia Tried Trazodone but was not tolerated.  Seborrheic keratosis Continue to monitor, if starts to bother her will send to derm for removal    Future Appointments  Date Time Provider [place]/08/2021 10:40 AM [name] [facility] None  10/19/2021 11:00 AM Karen [facility]  12/15/2021 10:30 AM [name] [facility] None  08/04/2022  2:00 PM [name] [facility] None  '    Subjective:   [name] 75 y11. female who presents for Medicare Annual Wellness Visit and 3 month follow up on hypertension, diabetes, hyperlipidemia,  vitamin D def.    Her blood pressure has been controlled at home(120's/70's), today their BP is BP: (!) 140/50   Sees Dr. Leta [name] yearly for previous CVA x 2, as well as several TIA. Will have an occasional migraine, will lose vision and have nausea. Will use 2 excedrin or Ibuprofen.  She has COPD, has lung nodules, last one was CT chest (09/2020) -pulmonologist retired She has albuterol as needed. Needs to schedule appointment for follow up.   She follows with Dr. Candiss Norse for CKD stage 4. Kidney functions have stayed stable.   BMI is Body mass index is 29.99 kg/Karen., she has been working on diet and exercise.Exercise is difficult due to PAD and neuropathy Wt Readings from Last 3 Encounters:  08/04/21 185 lb 12.8 oz (84.3 kg)  06/08/21 185 lb 9.6 oz (84.2 kg)  01/26/21 191 lb 9.6 oz (86.9 kg)    She has several comorbidities from her DM including Neuropathy Retinopathy Dr. Kathlen [name] 04/2021- follow up 6 months Hyperlipidemia at goal less than 70, on crestor 40 mg CKD she is on ARB (off metformin due to CKDq)  history of TIA she is on 325 ASA PAD s/p stents   Novolog 70/30 mix on 314 units before each meal BS running 87-139 Tresiba 40 units QAM She is on farxiga and tolerating it well she been working on diet and exercise for diabetes and following with Dr. Darnell Level denies polydipsia, polyuria and visual disturbances.  Last A1C in the office was:  Lab Results  Component Value Date   HGBA1C 8.7 (A) 06/08/2021    Lab Results  Component Value Date   CHOL 130 12/15/2020   HDL 51 12/15/2020   LDLCALC 58 12/15/2020   LDLDIRECT 146.4 01/30/2010   TRIG 128 12/15/2020   CHOLHDL 2.5 12/15/2020   Lab Results  Component Value Date   GFRAA 32 (L) 12/15/2020    Patient is on Vitamin D. Lab Results  Component Value Date   VD25OH 21 (L) 08/04/2020     Names of Other Physician/Practitioners you currently use: 1. [facility]- here for primary care 2. Dr. Herbert Deaner,  DIABETIC eye doctor, last visit 08/2020 Retinopathy Patient Care Team: Karen Pinto, MD as PCP - General (Internal Medicine) Karen Breeding, MD as PCP - Cardiology (Cardiology) Karen Santee, MD as Consulting Physician (Neurology) Karen Mitchell, MD as Consulting Physician (Vascular Surgery) Irene Shipper, MD as Consulting Physician (Gastroenterology) Gardiner Barefoot, DPM as Consulting Physician (Podiatry) Hortencia Pilar, MD as Consulting Physician (Surgery) Grace Isaac, MD (Inactive) as Consulting Physician (Cardiothoracic Surgery) Rush Landmark, [facility] as Pharmacist (Pharmacist)  Medication Review  Current Outpatient Medications (Endocrine & Metabolic):    FARXIGA 10 MG TABS tablet, TAKE 10 MG BY MOUTH DAILY BEFORE BREAKFAST.   insulin degludec (TRESIBA FLEXTOUCH) 200 UNIT/ML FlexTouch Pen, Inject 40 Units into the skin daily. INJECT 34 UNITS DAILY UNDER SKIN   NOVOLOG FLEXPEN 100 UNIT/ML FlexPen, INJECT 14 UNITS INTO THE SKIN 3 (THREE) TIMES DAILY WITH MEALS. (Patient not taking: Reported on 08/04/2021)  Current Outpatient Medications (Cardiovascular):    amLODipine (NORVASC) 10 MG tablet, TAKE 1 TABLET DAILY FOR BLOOD PRESSURE   chlorthalidone (HYGROTON) 25 MG tablet, Take 12.5 mg by mouth daily.   olmesartan (BENICAR) 40 MG tablet, TAKE 1 TABLET DAILY FOR BLOOD PRESSURE   rosuvastatin (CRESTOR) 40 MG tablet, TAKE 1 TABLET BY MOUTH EVERY DAY FOR CHOLESTEROL  Current Outpatient Medications (Respiratory):    albuterol (VENTOLIN HFA) 108 (  90 Base) MCG/ACT inhaler, Inhale 2 puffs into the lungs every 4 (four) hours as needed for wheezing or shortness of breath. (Patient not taking: No sig reported)   Tiotropium Bromide Monohydrate (SPIRIVA RESPIMAT) 2.5 MCG/ACT AERS, Inhale 1 puff into the lungs daily. (Patient not taking: Reported on 08/04/2021)  Current Outpatient Medications (Analgesics):    aspirin 325 MG EC tablet, Take 325 mg by mouth daily.   Current Outpatient  Medications (Other):    Cholecalciferol (VITAMIN D PO), Take 5,000 Units by mouth daily.   citalopram (CELEXA) 40 MG tablet, TAKE 1 TABLET DAILY FOR MOOD & ANXIETY   Magnesium 500 MG TABS, Take 500 mg by mouth daily.   Omega-3 Fatty Acids (FISH OIL) 1200 MG CAPS, Take 1,200 capsules by mouth daily.    omeprazole (PRILOSEC) 40 MG capsule, Take 1 capsule (40 mg total) by mouth in the morning and at bedtime.   rOPINIRole (REQUIP) 0.25 MG tablet, Take 1 tablet (0.25 mg total) by mouth at bedtime.   b complex vitamins capsule, Take 1 capsule by mouth daily. (Patient not taking: Reported on 08/04/2021)   Blood Glucose Monitoring Suppl (ONETOUCH VERIO FLEX SYSTEM) w/Device KIT, USE AS ADVISED   CINNAMON PO, Take 1,000 mg by mouth 2 (two) times daily.  (Patient not taking: Reported on 08/04/2021)   glucose blood test strip, Use as instructed 3x a day   Insulin Pen Needle 29G X 5MM MISC, Use with insulin pen 3 times a day.   OneTouch Delica Lancets 20F MISC, Use 3x a day   OVER THE COUNTER MEDICATION, Take 1 tablet by mouth daily. One a day vitamin Woman's one a day (Patient not taking: Reported on 08/04/2021)  Current Problems (verified) Patient Active Problem List   Diagnosis Date Noted   Pain in right knee 01/07/2020   Carotid artery stenosis 12/03/2019   Migraine with aura and without status migrainosus, not intractable 11/28/2019   Hyperlipidemia associated with type 2 diabetes mellitus ([place]) 11/10/2019   Coronary artery calcification 08/05/2019   PAOD (peripheral arterial occlusive disease) ([place]) 02/26/2019   SOB (shortness of breath) 11/17/2018   Cardiomegaly 11/17/2018   Emphysema of lung ([place]) 10/10/2017   Atherosclerosis of aorta ([place]) 10/10/2017   Lung nodule 10/10/2017   Anxiety 08/22/2017   Arthritis 08/22/2017   Neuropathy due to secondary diabetes ([place]) 08/22/2017   PVD (peripheral vascular disease) ([place]) 04/13/2017   Type 2 diabetes mellitus with circulatory disorder, with  long-term current use of insulin ([place]) 11/07/2015   Encounter for Medicare annual wellness exam 06/24/2015   Generalized anxiety disorder 03/26/2015   Depression, major, recurrent, in partial remission ([place]) 03/26/2015   History of CVA (cerebrovascular accident) 10/04/2014   Tobacco abuse 10/04/2014   Vitamin D deficiency 11/02/2013   Medication management 11/02/2013   Peripheral vascular disease due to secondary diabetes mellitus ([place]) 05/08/2012   Atherosclerosis of native arteries of extremity with intermittent claudication 12/24/2011   Diverticulosis of large intestine 06/09/2010   History of colonic polyps 06/09/2010   Esophageal reflux 08/27/2008   Nontoxic multinodular goiter 03/11/2008   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[place]) 03/11/2008   Brain aneurysm 03/11/2008   CKD stage 3 due to type 2 diabetes mellitus ([place]) 05/01/2007   Essential hypertension 05/01/2007    Screening Tests Immunization History  Administered Date(s) Administered   DT (Pediatric) 03/26/2015   Fluad Quad(high Dose 65+) 07/09/2019   Influenza Whole 08/02/2007, 06/18/2009, 07/09/2013   Influenza, High Dose Seasonal PF 08/05/2015, 07/22/2016, 07/04/2017, 06/30/2020  Influenza-Unspecified 07/17/2021   PFIZER Comirnaty(Gray Top)Covid-19 Tri-Sucrose Vaccine 10/13/2019, 11/03/2019   PFIZER(Purple Top)SARS-COV-2 Vaccination 06/30/2020   Pfizer Covid-19 Vaccine Bivalent Booster 31yr & up 06/17/2021   Pneumococcal Conjugate-13 07/22/2016   Pneumococcal Polysaccharide-23 02/08/2014   Td 09/21/2004   Unspecified SARS-COV-2 Vaccination 10/13/2019, 11/03/2019  Prior vaccinations: TD or Tdap: 2016  Influenza: 20203 Pneumococcal: 2015 Prevnar 13:2017 Shingles/Zostavax: Discussed with patient  Allergies Allergies  Allergen Reactions   Ciprofloxacin Other (See Comments)   Ozempic (0.25 Or 0.5 Mg-Dose) [Semaglutide(0.25 Or 0.521mDos)] Nausea And Vomiting   Plavix [Clopidogrel Bisulfate] Palpitations    Amoxicillin Itching and Swelling    FACE & EYES SWELL   Ace Inhibitors    Ciprofloxacin Hcl Swelling   Lyrica [Pregabalin]     Itch, gain weight   Penicillins Other (See Comments)    REACTION: unspecified    SURGICAL HISTORY She  has a past surgical history that includes Cesarean section; Cholecystectomy; Knee surgery; rotator cuff surgery; Thyroid surgery; Hernia repair; Abdominal hysterectomy (1984); Femoral artery stent (05/11/11); Dental surgery (Aug. 16, 2013); Eye surgery (Nov. 2014); Esophagogastroduodenoscopy (07/2010); Colonoscopy (07/2010); abdominal aortagram (N/A, 01/04/2012); abdominal aortagram (N/A, 08/15/2012); lower extremity angiogram (Left, 08/15/2012); cataract surgery (Right, 10-22-2015); and cataract surgery (Left, 11-05-2015). FAMILY HISTORY Her family history includes CAD (age of onset: 6083in her brother; CAD (age of onset: 6169in her father and mother; Diabetes in her sister; Heart attack in her brother, brother, father, and mother; Heart disease in her brother, brother, father, and mother; Hypertension in her mother and sister; Stroke in her sister. SOCIAL HISTORY She  reports that she has been smoking cigarettes. She has a 20.00 pack-year smoking history. She has never used smokeless tobacco. She reports that she does not drink alcohol and does not use drugs.  Review of Systems  Constitutional:  Positive for malaise/fatigue. Negative for weight loss.  HENT:  Positive for congestion. Negative for hearing loss, sinus pain and tinnitus.   Eyes:  Negative for blurred vision.  Respiratory:  Positive for wheezing. Negative for cough, sputum production and shortness of breath.   Cardiovascular:  Positive for claudication and PND. Negative for chest pain, palpitations and leg swelling.  Gastrointestinal:  Negative for abdominal pain, constipation, diarrhea, heartburn, nausea and vomiting.  Genitourinary:  Negative for dysuria.  Musculoskeletal:  Positive for joint pain.   Skin:        Raised area on left buttock  Neurological:  Positive for sensory change, weakness (R>L) and headaches. Negative for dizziness.       RLS  Endo/Heme/Allergies:  Does not bruise/bleed easily.  Psychiatric/Behavioral:  Positive for memory loss. Negative for depression.    Objective:   Blood pressure (!) 140/50, pulse 70, temperature (!) 97.5 F (36.4 C), height _0  (1.676 Karen), weight 185 lb 12.8 oz (84.3 kg), SpO2 92 %. Body mass index is 29.99 kg/Karen.  General appearance: alert, no distress, WD/WN,  female HEENT: normocephalic, sclerae anicteric, TMs pearly, nares patent, no discharge or erythema, pharynx normal. Oral cavity: MMM, no lesions Neck: supple, no lymphadenopathy, no thyromegaly, no masses Heart: RRR, normal S1, S2, no murmurs Lungs: CTA bilaterally, no wheezes, rhonchi, or rales Abdomen: +bs, soft, non tender, non distended, no masses, no hepatomegaly, no splenomegaly Skin: 2 cm seborrheic keratosis on left buttock Musculoskeletal: nontender, no swelling, no obvious deformity Extremities: no edema, no cyanosis, no clubbing Pulses:DP and PT diminished on left, normal on right Neurological: alert, oriented x 3, CN2-12 intact, strength diminished right foot and hand,  Diminished monofilament on all toes but great toe bilaterally.DTRs 2+ throughout, no cerebellar signs, gait normal Psychiatric: normal affect, behavior normal, pleasant  Breast: defer Gyn: defer Rectal: defer EKG: NSR , no ST changes    Karen Bernheim, NP   08/04/2021

## 2021-08-04 ENCOUNTER — Ambulatory Visit (INDEPENDENT_AMBULATORY_CARE_PROVIDER_SITE_OTHER): Payer: Medicare Other | Admitting: Nurse Practitioner

## 2021-08-04 ENCOUNTER — Other Ambulatory Visit: Payer: Self-pay

## 2021-08-04 ENCOUNTER — Encounter: Payer: Self-pay | Admitting: Nurse Practitioner

## 2021-08-04 VITALS — BP 140/50 | HR 70 | Temp 97.5°F | Ht 66.0 in | Wt 185.8 lb

## 2021-08-04 DIAGNOSIS — Z79899 Other long term (current) drug therapy: Secondary | ICD-10-CM

## 2021-08-04 DIAGNOSIS — J439 Emphysema, unspecified: Secondary | ICD-10-CM

## 2021-08-04 DIAGNOSIS — I10 Essential (primary) hypertension: Secondary | ICD-10-CM | POA: Diagnosis not present

## 2021-08-04 DIAGNOSIS — E1122 Type 2 diabetes mellitus with diabetic chronic kidney disease: Secondary | ICD-10-CM | POA: Diagnosis not present

## 2021-08-04 DIAGNOSIS — I700 Atherosclerosis of aorta: Secondary | ICD-10-CM | POA: Diagnosis not present

## 2021-08-04 DIAGNOSIS — E1340 Other specified diabetes mellitus with diabetic neuropathy, unspecified: Secondary | ICD-10-CM | POA: Diagnosis not present

## 2021-08-04 DIAGNOSIS — E1169 Type 2 diabetes mellitus with other specified complication: Secondary | ICD-10-CM | POA: Diagnosis not present

## 2021-08-04 DIAGNOSIS — E66811 Obesity, class 1: Secondary | ICD-10-CM

## 2021-08-04 DIAGNOSIS — F3341 Major depressive disorder, recurrent, in partial remission: Secondary | ICD-10-CM

## 2021-08-04 DIAGNOSIS — E669 Obesity, unspecified: Secondary | ICD-10-CM

## 2021-08-04 DIAGNOSIS — N183 Chronic kidney disease, stage 3 unspecified: Secondary | ICD-10-CM

## 2021-08-04 DIAGNOSIS — I779 Disorder of arteries and arterioles, unspecified: Secondary | ICD-10-CM | POA: Diagnosis not present

## 2021-08-04 DIAGNOSIS — E1351 Other specified diabetes mellitus with diabetic peripheral angiopathy without gangrene: Secondary | ICD-10-CM | POA: Diagnosis not present

## 2021-08-04 DIAGNOSIS — G2581 Restless legs syndrome: Secondary | ICD-10-CM

## 2021-08-04 DIAGNOSIS — Z794 Long term (current) use of insulin: Secondary | ICD-10-CM

## 2021-08-04 DIAGNOSIS — E042 Nontoxic multinodular goiter: Secondary | ICD-10-CM

## 2021-08-04 DIAGNOSIS — Z0001 Encounter for general adult medical examination with abnormal findings: Secondary | ICD-10-CM

## 2021-08-04 DIAGNOSIS — E785 Hyperlipidemia, unspecified: Secondary | ICD-10-CM | POA: Diagnosis not present

## 2021-08-04 DIAGNOSIS — L821 Other seborrheic keratosis: Secondary | ICD-10-CM

## 2021-08-04 DIAGNOSIS — G4700 Insomnia, unspecified: Secondary | ICD-10-CM

## 2021-08-04 DIAGNOSIS — Z8673 Personal history of transient ischemic attack (TIA), and cerebral infarction without residual deficits: Secondary | ICD-10-CM

## 2021-08-04 MED ORDER — ROPINIROLE HCL 0.25 MG PO TABS: 0.2500 mg | ORAL_TABLET | Freq: Every day | ORAL | 2 refills | Status: DC

## 2021-08-04 NOTE — Patient Instructions (Signed)
Seborrheic Keratosis A seborrheic keratosis is a common, noncancerous (benign) skin growth. These growths are velvety, waxy, rough, tan, brown, or black spots that appear on the skin. These skin growths can be flat or raised, andscaly. What are the causes? The cause of this condition is not known. What increases the risk? You are more likely to develop this condition if you: Have a family history of seborrheic keratosis. Are 50 or older. Are pregnant. Have had estrogen replacement therapy. What are the signs or symptoms? Symptoms of this condition include growths on the face, chest, shoulders, back, or other areas. These growths: Are usually painless, but may become irritated and itchy. Can be yellow, brown, black, or other colors. Are slightly raised or have a flat surface. Are sometimes rough or wart-like in texture. Are often velvety or waxy on the surface. Are round or oval-shaped. Often occur in groups, but may occur as a single growth. How is this diagnosed? This condition is diagnosed with a medical history and physical exam. A sample of the growth may be tested (skin biopsy). You may need to see a skin specialist (dermatologist). How is this treated? Treatment is not usually needed for this condition, unless the growths are irritated or bleed often. You may also choose to have the growths removed if you do not like their appearance. Most commonly, these growths are treated with a procedure in which liquid nitrogen is applied to "freeze" off the growth (cryosurgery). They may also be burned off with electricity (electrocautery) or removed by scraping (curettage). Follow these instructions at home: Watch your growth for any changes. Keep all follow-up visits as told by your health care provider. This is important. Do not scratch or pick at the growth or growths. This can cause them to become irritated or infected. Contact a health care provider if: You suddenly have many new  growths. Your growth bleeds, itches, or hurts. Your growth suddenly becomes larger or changes color. Summary A seborrheic keratosis is a common, noncancerous (benign) skin growth. Treatment is not usually needed for this condition, unless the growths are irritated or bleed often. Watch your growth for any changes. Contact a health care provider if you suddenly have many new growths or your growth suddenly becomes larger or changes color. Keep all follow-up visits as told by your health care provider. This is important. This information is not intended to replace advice given to you by your health care provider. Make sure you discuss any questions you have with your healthcare provider. Document Revised: 01/19/2018 Document Reviewed: 01/19/2018 Elsevier Patient Education  2022 Elsevier Inc.  

## 2021-08-05 LAB — HEMOGLOBIN A1C
Hgb A1c MFr Bld: 8.5 % of total Hgb — ABNORMAL HIGH (ref <5.7)
Mean Plasma Glucose: 197 mg/dL
eAG (mmol/L): 10.9 mmol/L

## 2021-08-05 LAB — URINALYSIS, ROUTINE W REFLEX MICROSCOPIC
Bacteria, UA: NONE SEEN /HPF
Bilirubin Urine: NEGATIVE
Hgb urine dipstick: NEGATIVE
Hyaline Cast: NONE SEEN /LPF
Ketones, ur: NEGATIVE
Leukocytes,Ua: NEGATIVE
Nitrite: NEGATIVE
RBC / HPF: NONE SEEN /HPF (ref 0–2)
Specific Gravity, Urine: 1.027 (ref 1.001–1.035)
Squamous Epithelial / HPF: NONE SEEN /HPF (ref <=5)
WBC, UA: NONE SEEN /HPF (ref 0–5)
pH: <=5 (ref 5.0–8.0)

## 2021-08-05 LAB — CBC WITH DIFFERENTIAL/PLATELET
Absolute Monocytes: 690 cells/uL (ref 200–950)
Basophils Absolute: 46 cells/uL (ref 0–200)
Basophils Relative: 0.5 %
Eosinophils Absolute: 147 cells/uL (ref 15–500)
Eosinophils Relative: 1.6 %
HCT: 45.4 % — ABNORMAL HIGH (ref 35.0–45.0)
Hemoglobin: 15.2 g/dL (ref 11.7–15.5)
Lymphs Abs: 2732 cells/uL (ref 850–3900)
MCH: 33.1 pg — ABNORMAL HIGH (ref 27.0–33.0)
MCHC: 33.5 g/dL (ref 32.0–36.0)
MCV: 98.9 fL (ref 80.0–100.0)
MPV: 11.5 fL (ref 7.5–12.5)
Monocytes Relative: 7.5 %
Neutro Abs: 5584 cells/uL (ref 1500–7800)
Neutrophils Relative %: 60.7 %
Platelets: 247 10*3/uL (ref 140–400)
RBC: 4.59 10*6/uL (ref 3.80–5.10)
RDW: 11.6 % (ref 11.0–15.0)
Total Lymphocyte: 29.7 %
WBC: 9.2 10*3/uL (ref 3.8–10.8)

## 2021-08-05 LAB — COMPLETE METABOLIC PANEL WITH GFR
AG Ratio: 1.3 (calc) (ref 1.0–2.5)
ALT: 16 U/L (ref 6–29)
AST: 22 U/L (ref 10–35)
Albumin: 3.9 g/dL (ref 3.6–5.1)
Alkaline phosphatase (APISO): 71 U/L (ref 37–153)
BUN/Creatinine Ratio: 17 (calc) (ref 6–22)
BUN: 32 mg/dL — ABNORMAL HIGH (ref 7–25)
CO2: 33 mmol/L — ABNORMAL HIGH (ref 20–32)
Calcium: 9.8 mg/dL (ref 8.6–10.4)
Chloride: 101 mmol/L (ref 98–110)
Creat: 1.93 mg/dL — ABNORMAL HIGH (ref 0.60–1.00)
Globulin: 2.9 g/dL (calc) (ref 1.9–3.7)
Glucose, Bld: 178 mg/dL — ABNORMAL HIGH (ref 65–99)
Potassium: 4.4 mmol/L (ref 3.5–5.3)
Sodium: 140 mmol/L (ref 135–146)
Total Bilirubin: 0.5 mg/dL (ref 0.2–1.2)
Total Protein: 6.8 g/dL (ref 6.1–8.1)
eGFR: 27 mL/min/{1.73_m2} — ABNORMAL LOW (ref >=60)

## 2021-08-05 LAB — TSH: TSH: 1.6 mIU/L (ref 0.40–4.50)

## 2021-08-05 LAB — LIPID PANEL
Cholesterol: 130 mg/dL (ref <200)
HDL: 43 mg/dL — ABNORMAL LOW (ref >=50)
LDL Cholesterol (Calc): 65 mg/dL (calc)
Non-HDL Cholesterol (Calc): 87 mg/dL (calc) (ref <130)
Total CHOL/HDL Ratio: 3 (calc) (ref <5.0)
Triglycerides: 141 mg/dL (ref <150)

## 2021-08-05 LAB — MICROSCOPIC MESSAGE

## 2021-08-05 LAB — MICROALBUMIN / CREATININE URINE RATIO
Creatinine, Urine: 85 mg/dL (ref 20–275)
Microalb Creat Ratio: 204 mcg/mg creat — ABNORMAL HIGH (ref <30)
Microalb, Ur: 17.3 mg/dL

## 2021-08-05 LAB — MAGNESIUM: Magnesium: 2.2 mg/dL (ref 1.5–2.5)

## 2021-08-10 DIAGNOSIS — Z1231 Encounter for screening mammogram for malignant neoplasm of breast: Secondary | ICD-10-CM | POA: Diagnosis not present

## 2021-08-10 DIAGNOSIS — Z7689 Persons encountering health services in other specified circumstances: Secondary | ICD-10-CM | POA: Diagnosis not present

## 2021-08-28 ENCOUNTER — Encounter: Payer: Self-pay | Admitting: Internal Medicine

## 2021-08-31 ENCOUNTER — Ambulatory Visit: Payer: Medicare Other | Admitting: Internal Medicine

## 2021-09-28 DIAGNOSIS — H40023 Open angle with borderline findings, high risk, bilateral: Secondary | ICD-10-CM | POA: Diagnosis not present

## 2021-09-28 DIAGNOSIS — E119 Type 2 diabetes mellitus without complications: Secondary | ICD-10-CM | POA: Diagnosis not present

## 2021-09-28 DIAGNOSIS — H04123 Dry eye syndrome of bilateral lacrimal glands: Secondary | ICD-10-CM | POA: Diagnosis not present

## 2021-09-28 DIAGNOSIS — H538 Other visual disturbances: Secondary | ICD-10-CM | POA: Diagnosis not present

## 2021-09-28 LAB — HM DIABETES EYE EXAM

## 2021-10-01 ENCOUNTER — Encounter: Payer: Self-pay | Admitting: Internal Medicine

## 2021-10-04 ENCOUNTER — Other Ambulatory Visit: Payer: Self-pay | Admitting: Internal Medicine

## 2021-10-04 DIAGNOSIS — Z794 Long term (current) use of insulin: Secondary | ICD-10-CM

## 2021-10-04 DIAGNOSIS — E1122 Type 2 diabetes mellitus with diabetic chronic kidney disease: Secondary | ICD-10-CM

## 2021-10-06 ENCOUNTER — Encounter (HOSPITAL_COMMUNITY): Payer: Self-pay

## 2021-10-06 ENCOUNTER — Other Ambulatory Visit: Payer: Self-pay

## 2021-10-06 ENCOUNTER — Emergency Department (HOSPITAL_COMMUNITY): Payer: Medicare Other

## 2021-10-06 ENCOUNTER — Emergency Department (HOSPITAL_COMMUNITY)
Admission: EM | Admit: 2021-10-06 | Discharge: 2021-10-06 | Disposition: A | Discharge status: Home / Self Care | Payer: Medicare Other | Attending: Emergency Medicine | Admitting: Emergency Medicine

## 2021-10-06 DIAGNOSIS — Z794 Long term (current) use of insulin: Secondary | ICD-10-CM | POA: Diagnosis not present

## 2021-10-06 DIAGNOSIS — W07XXXA Fall from chair, initial encounter: Secondary | ICD-10-CM | POA: Insufficient documentation

## 2021-10-06 DIAGNOSIS — I129 Hypertensive chronic kidney disease with stage 1 through stage 4 chronic kidney disease, or unspecified chronic kidney disease: Secondary | ICD-10-CM | POA: Insufficient documentation

## 2021-10-06 DIAGNOSIS — S91115A Laceration without foreign body of left lesser toe(s) without damage to nail, initial encounter: Secondary | ICD-10-CM | POA: Diagnosis not present

## 2021-10-06 DIAGNOSIS — Z79899 Other long term (current) drug therapy: Secondary | ICD-10-CM | POA: Insufficient documentation

## 2021-10-06 DIAGNOSIS — R2242 Localized swelling, mass and lump, left lower limb: Secondary | ICD-10-CM | POA: Diagnosis not present

## 2021-10-06 DIAGNOSIS — M7989 Other specified soft tissue disorders: Secondary | ICD-10-CM | POA: Diagnosis not present

## 2021-10-06 DIAGNOSIS — E1122 Type 2 diabetes mellitus with diabetic chronic kidney disease: Secondary | ICD-10-CM | POA: Diagnosis not present

## 2021-10-06 DIAGNOSIS — S81812A Laceration without foreign body, left lower leg, initial encounter: Secondary | ICD-10-CM | POA: Diagnosis not present

## 2021-10-06 DIAGNOSIS — S99922A Unspecified injury of left foot, initial encounter: Secondary | ICD-10-CM | POA: Diagnosis not present

## 2021-10-06 DIAGNOSIS — N183 Chronic kidney disease, stage 3 unspecified: Secondary | ICD-10-CM | POA: Diagnosis not present

## 2021-10-06 DIAGNOSIS — W19XXXA Unspecified fall, initial encounter: Secondary | ICD-10-CM

## 2021-10-06 DIAGNOSIS — Z23 Encounter for immunization: Secondary | ICD-10-CM | POA: Insufficient documentation

## 2021-10-06 DIAGNOSIS — Z7982 Long term (current) use of aspirin: Secondary | ICD-10-CM | POA: Diagnosis not present

## 2021-10-06 DIAGNOSIS — S80812A Abrasion, left lower leg, initial encounter: Secondary | ICD-10-CM | POA: Diagnosis not present

## 2021-10-06 MED ORDER — TETANUS-DIPHTH-ACELL PERTUSSIS 5-2.5-18.5 LF-MCG/0.5 IM SUSY
0.5000 mL | PREFILLED_SYRINGE | Freq: Once | INTRAMUSCULAR | Status: AC
  Administered 2021-10-06: 0.5 mL via INTRAMUSCULAR
  Filled 2021-10-06: qty 0.5

## 2021-10-06 NOTE — ED Provider Triage Note (Signed)
Emergency Medicine Provider Triage Evaluation Note  [name] , a 76 y.o. female  was evaluated in triage.  Pt complains of fall that occurred earlier today.  Patient reports that she was sitting in a highchair in her bathroom ready to put braids in her hair when she fell backwards.  She states that she fell onto her shoulder and hit her head on the carpet however denies loss of consciousness and is not anticoagulated.  She reports that she has been having pain to her left fourth and fifth toes since that time.  She also has a skin tear to the medial aspect of her left lower leg.  She believes her tetanus is up-to-date.  She went to urgent care who advised she come to the ED today due to an area of hardness near the skin tear and to rule out a blood clot.  Patient states she did not start having pain to this area until she experienced the fall and skin tear today.  She denies any calf pain.  She denies any swelling.  No other complaints at this time..  Review of Systems  Positive: + toe pain, leg pain, skin tear Negative: - LOC  Physical Exam  BP (!) 149/56 (BP Location: Left Arm)    Pulse 65    Temp 97.9 F (36.6 C) (Oral)    Resp 16    Ht 5\' 6"  (1.676 m)    Wt 83.5 kg    SpO2 96%    BMI 29.70 kg/m  Gen:   Awake, no distress   Resp:  Normal effort  MSK:   Moves extremities without difficulty  Other:  Alert and oriented x 4. Acting at baseline per husband. No signs of head trauma. + TTP to L 4th and 5th toes. Cap refill < 2 seconds. 2+ DP pulse on L foot. Skin tear to medial aspect of distal L lower leg; bleeding controlled. Mild TTP to this area. No calf swelling, erythema, TTP.   Medical Decision Making  Medically screening exam initiated at 6:47 PM.  Appropriate orders placed.  YEHUDIS MONCEAUX was informed that the remainder of the evaluation will be completed by another provider, this initial triage assessment does not replace that evaluation, and the importance of remaining in the ED until  their evaluation is complete.     Eustaquio Maize, PA-C 10/06/21 1851

## 2021-10-06 NOTE — Discharge Instructions (Addendum)
You came to the emergency department today to be evaluated for your injuries after suffering a fall.  The x-ray of your left foot and left lower leg showed no broken bones or dislocations.  Your tetanus shot was updated today.  Please follow-up with your primary care doctor if you have no improvement in your pain.  Please take Tylenol (acetaminophen) to relieve your pain.  You make take tylenol, up to 1,000 mg (two extra strength pills) every 8 hours as needed.  Do not take more than 3,000 mg tylenol in a 24 hour period (not more than one dose every 8 hours.  Please check all medication labels as many medications such as pain and cold medications may contain tylenol.  Do not drink alcohol while taking these medications.   Get help right away if: Your pain suddenly increases and is severe. You develop severe swelling around the wound. The wound is on your hand or foot, and you cannot properly move a finger or toe. The wound is on your hand or foot, and you notice that your fingers or toes look pale or bluish. You have a red streak going away from your wound. You develop painful lumps near the wound or on skin anywhere else on your body.

## 2021-10-06 NOTE — ED Triage Notes (Signed)
Patient reports that she was sitting on a high chair in her bathroom and the chair gave way causing her to fall. Patient states she did hit her head on carpeted floor. Patient is not on blood thinners, but does take Aspirin.  Patient c/o left 4th and 5th toe pain. Patient has a skin tear to the left lower leg and the area is red and swollen.  Patient went to an UC today and was told t come to the ED to r/o a blood clot.

## 2021-10-06 NOTE — ED Provider Notes (Signed)
[facility] DEPT Provider Note   CSN: [id number] Arrival date & time: 10/06/21  1745     History  Chief Complaint  Patient presents with   Fall   Toe Injury    [name] is a 76 y.o. female with past medical history of diabetes mellitus, peripheral vascular disease, pulmonary emphysema, hypertension, history of CVA, CKD stage III.  Presents to the emergency department with a chief complaint of skin tear and toe pain after suffering a fall.  Patient states that fall occurred 9 to 9:30 AM.  Patient was sitting on a chair when she tipped backwards and the chair broke.  Patient Dors is hitting her head on carpeted floor but denies any loss of consciousness.  Patient is not on any blood thinners.  Patient states that her left foot got caught in the chair while falling which resulted in a skin tear to her left lower leg.  Patient complains of pain associated with skin tear.  Patient also complains of pain to left fourth and fifth toes.  Patient rates pain 4/10 on pain scale.  Pain is worse with touch and movement.  Patient denies attempting any modalities to alleviate her pain.  Patient states that she was able to go to work without any difficulties today.  Patient is unsure when her last tetanus shot was.  Patient reports that she has numbness to bilateral upper and lower extremities at baseline due to neuropathy.  Patient denies any change in numbness after fall.  Patient states that she was seen at urgent care earlier today and sent to the emergency department for x-ray imaging.  Patient denies any neck pain, back pain, weakness, saddle anesthesia, visual disturbance, bowel or bladder dysfunction, headache, seizures, vomiting, color change, pallor.   Fall Pertinent negatives include no chest pain, no abdominal pain, no headaches and no shortness of breath.      Home Medications Prior to Admission medications   Medication Sig Start Date End Date Taking?  Authorizing Provider  amLODipine (NORVASC) 10 MG tablet TAKE 1 TABLET DAILY FOR BLOOD PRESSURE 05/16/21   [name], MD  aspirin 325 MG EC tablet Take 325 mg by mouth daily.    [provider]  Blood Glucose Monitoring Suppl (ONETOUCH VERIO FLEX SYSTEM) w/Device KIT USE AS ADVISED 07/20/21   Philemon Kingdom, MD  chlorthalidone (HYGROTON) 25 MG tablet Take 12.5 mg by mouth daily. 11/21/20   [provider]  Cholecalciferol (VITAMIN D PO) Take 5,000 Units by mouth daily.    [provider]  citalopram (CELEXA) 40 MG tablet TAKE 1 TABLET DAILY FOR MOOD & ANXIETY 07/07/21   Liane Comber, NP  FARXIGA 10 MG TABS tablet TAKE 10 MG BY MOUTH DAILY BEFORE BREAKFAST. 10/30/20   Philemon Kingdom, MD  glucose blood test strip Use as instructed 3x a day 06/08/21   Philemon Kingdom, MD  Insulin Pen Needle 29G X 5MM MISC Use with insulin pen 3 times a day. 01/16/20   Philemon Kingdom, MD  Magnesium 500 MG TABS Take 500 mg by mouth daily.    [provider]  olmesartan (BENICAR) 40 MG tablet TAKE 1 TABLET DAILY FOR BLOOD PRESSURE 05/16/21   [name], MD  Omega-3 Fatty Acids (FISH OIL) 1200 MG CAPS Take 1,200 capsules by mouth daily.     [provider]  omeprazole (PRILOSEC) 40 MG capsule Take 1 capsule (40 mg total) by mouth in the morning and at bedtime. 07/22/20   Scarlette Shorts  N, MD  OneTouch Delica Lancets 70W MISC Use 3x a day 06/08/21   Philemon Kingdom, MD  rOPINIRole (REQUIP) 0.25 MG tablet Take 1 tablet (0.25 mg total) by mouth at bedtime. 08/04/21 08/04/22  Magda Bernheim, NP  rosuvastatin (CRESTOR) 40 MG tablet TAKE 1 TABLET BY MOUTH EVERY DAY FOR CHOLESTEROL 06/23/21   Mull, Townsend Roger, NP  TRESIBA FLEXTOUCH 200 UNIT/ML FlexTouch Pen INJECT 34 UNITS INTO THE SKIN DAILY. 10/05/21   Philemon Kingdom, MD      Allergies    Ciprofloxacin, Ozempic (0.25 or 0.5 mg-dose) [semaglutide(0.25 or 0.67m-dos)], Plavix [clopidogrel bisulfate], Amoxicillin, Ace  inhibitors, Ciprofloxacin hcl, Lyrica [pregabalin], and Penicillins    Review of Systems   Review of Systems  Constitutional:  Negative for chills and fever.  HENT:  Negative for facial swelling.   Eyes:  Negative for visual disturbance.  Respiratory:  Negative for shortness of breath.   Cardiovascular:  Negative for chest pain.  Gastrointestinal:  Negative for abdominal pain, nausea and vomiting.  Genitourinary:  Negative for difficulty urinating and dysuria.  Musculoskeletal:  Positive for arthralgias and myalgias. Negative for back pain and neck pain.  Skin:  Positive for wound. Negative for color change, pallor and rash.  Neurological:  Positive for numbness. Negative for dizziness, tremors, seizures, syncope, facial asymmetry, speech difficulty, weakness, light-headedness and headaches.  Psychiatric/Behavioral:  Negative for confusion.    Physical Exam Updated Vital Signs BP (!) 149/56 (BP Location: Left Arm)    Pulse 65    Temp 97.9 F (36.6 C) (Oral)    Resp 16    Ht _0  (1.676 m)    Wt 83.5 kg    SpO2 96%    BMI 29.70 kg/m  Physical Exam Vitals and nursing note reviewed.  Constitutional:      General: She is not in acute distress.    Appearance: She is not ill-appearing, toxic-appearing or diaphoretic.  HENT:     Head: Normocephalic and atraumatic.     Jaw: No trismus or pain on movement.  Eyes:     General: No scleral icterus.       Right eye: No discharge.        Left eye: No discharge.     Extraocular Movements: Extraocular movements intact.     Conjunctiva/sclera: Conjunctivae normal.     Pupils: Pupils are equal, round, and reactive to light.  Cardiovascular:     Rate and Rhythm: Normal rate.     Pulses:          Dorsalis pedis pulses are 2+ on the right side and 2+ on the left side.  Pulmonary:     Effort: Pulmonary effort is normal.  Abdominal:     Palpations: Abdomen is soft.     Tenderness: There is no abdominal tenderness.  Musculoskeletal:      Cervical back: Normal range of motion and neck supple. No swelling, edema, deformity, erythema, signs of trauma, lacerations, rigidity, spasms, torticollis, tenderness, bony tenderness or crepitus. No pain with movement. Normal range of motion.     Thoracic back: Bony tenderness present. No swelling, edema, deformity, signs of trauma, lacerations, spasms or tenderness.     Lumbar back: No swelling, edema, deformity, signs of trauma, lacerations, spasms, tenderness or bony tenderness.     Right lower leg: Normal.     Left lower leg: Laceration present. No swelling, deformity, tenderness or bony tenderness. No edema.     Right ankle: No swelling, deformity, ecchymosis or lacerations. No  tenderness. Normal range of motion.     Left ankle: No swelling, deformity, ecchymosis or lacerations. No tenderness. Normal range of motion.     Right foot: Normal range of motion and normal capillary refill. No swelling, deformity, laceration, tenderness, bony tenderness or crepitus. Normal pulse.     Left foot: Normal range of motion and normal capillary refill. Tenderness and bony tenderness present. No swelling, deformity, laceration or crepitus. Normal pulse.     Comments: No midline tenderness or deformity to cervical, thoracic, or lumbar spine.  No tenderness, bony tenderness, or deformity to bilateral upper extremities.  No tenderness, bony tenderness, or deformity to bilateral lower extremities except for tenderness to left fourth and fifth digit of left foot.  Sensation intact to all aspects of all digits on left foot.  Cap refill less than 2 seconds in all digits on the left foot.  Skin tear to medial aspect of left lower leg with surrounding tenderness as pictured below.  Feet:     Right foot:     Skin integrity: Dry skin present. No ulcer, blister, skin breakdown, erythema, warmth, callus or fissure.     Left foot:     Skin integrity: Dry skin present. No ulcer, blister, skin breakdown, erythema, warmth,  callus or fissure.  Skin:    General: Skin is warm and dry.  Neurological:     General: No focal deficit present.     Mental Status: She is alert and oriented to person, place, and time.     GCS: GCS eye subscore is 4. GCS verbal subscore is 5. GCS motor subscore is 6.     Cranial Nerves: No cranial nerve deficit, dysarthria or facial asymmetry.     Sensory: Sensation is intact.     Motor: No weakness, tremor, seizure activity or pronator drift.     Coordination: Finger-Nose-Finger Test normal.     Gait: Gait is intact. Gait normal.     Comments: CN II-XII intact, equal grip strength, +5 strength to bilateral upper and lower extremities, sensation to light touch grossly intact to bilateral upper and lower extremities.  Psychiatric:        Behavior: Behavior is cooperative.      ED Results / Procedures / Treatments   Labs (all labs ordered are listed, but only abnormal results are displayed) Labs Reviewed - No data to display  EKG None  Radiology DG Tibia/Fibula Left  Result Date: 10/06/2021 CLINICAL DATA:  Fall. EXAM: LEFT TIBIA AND FIBULA - 2 VIEW; LEFT FOOT - COMPLETE 3+ VIEW COMPARISON:  None. FINDINGS: Left tibia/fibula: There is no evidence of fracture or other focal bone lesions. There is soft tissue swelling of the distal medial lower extremity. There is no radiopaque foreign body. Peripheral vascular calcifications are present. Left foot: There is no acute fracture dislocation. Joint spaces are maintained. Soft tissues are within normal limits. IMPRESSION: 1. Soft tissue swelling of the left lower extremity. No evidence for foreign body. 2. No acute bony abnormality left tibia/fibula or left foot. Electronically Signed   By: Ronney Asters M.D.   On: 10/06/2021 19:14   DG Foot Complete Left  Result Date: 10/06/2021 CLINICAL DATA:  Fall. EXAM: LEFT TIBIA AND FIBULA - 2 VIEW; LEFT FOOT - COMPLETE 3+ VIEW COMPARISON:  None. FINDINGS: Left tibia/fibula: There is no evidence of  fracture or other focal bone lesions. There is soft tissue swelling of the distal medial lower extremity. There is no radiopaque foreign body. Peripheral vascular calcifications are  present. Left foot: There is no acute fracture dislocation. Joint spaces are maintained. Soft tissues are within normal limits. IMPRESSION: 1. Soft tissue swelling of the left lower extremity. No evidence for foreign body. 2. No acute bony abnormality left tibia/fibula or left foot. Electronically Signed   By: Ronney Asters M.D.   On: 10/06/2021 19:14    Procedures Procedures    Medications Ordered in ED Medications  Tdap (BOOSTRIX) injection 0.5 mL (0.5 mLs Intramuscular Given 10/06/21 2025)    ED Course/ Medical Decision Making/ A&P                           Medical Decision Making Risk Prescription drug management.   Alert 76 year old female no acute distress, nontoxic-appearing.  Presents to the emergency department with a chief complaint of skin tear and toe pain after suffering a fall.  Patient endorses hitting her head on carpeted floor but denies any loss of consciousness.  Neuro exam is reassuring.  Patient is on blood thinners.    Noncontrast head and cervical spine CT were considered.  Shared decision making with patient about obtaining noncontrast head CT to evaluate for possible intracranial hemorrhage due to her advanced age however patient declines at this time.  Patient has no midline tenderness or deformity to cervical spine with intact range of motion.  No CT cervical spine was ordered.  Medical records were reviewed complaining previous provider notes.  Patient's last documented tetanus shot was 2016.  Updated tetanus shot was ordered.  X-ray imaging of left foot and left tib-fib were obtained and independently reviewed by myself.  Imaging shows no acute osseous abnormality.  I agree with radiology interpretation.  Patient was offered pain medication however declines at this time.  Will  discharge patient to follow-up with PCP in outpatient setting as needed.  Patient care discussed with attending physician Dr. Langston Masker.  Discussed results, findings, treatment and follow up. Patient advised of return precautions. Patient verbalized understanding and agreed with plan.         Final Clinical Impression(s) / ED Diagnoses Final diagnoses:  Injury of toe on left foot, initial encounter  Skin tear of left lower leg without complication, initial encounter  Fall, initial encounter    Rx / DC Orders ED Discharge Orders     None         Dyann Ruddle 10/07/21 0019    Wyvonnia Dusky, MD 10/07/21 1039

## 2021-10-12 ENCOUNTER — Encounter: Payer: Self-pay | Admitting: Internal Medicine

## 2021-10-19 ENCOUNTER — Ambulatory Visit: Payer: Medicare Other | Admitting: Podiatry

## 2021-10-19 ENCOUNTER — Ambulatory Visit: Payer: Medicare Other | Admitting: Internal Medicine

## 2021-10-19 ENCOUNTER — Telehealth: Payer: Self-pay

## 2021-10-19 NOTE — Telephone Encounter (Signed)
Called pt. and educated her about her wound care but Pt. was confused as to why I was calling and stated, "This happened over two weeks ago, I don't need instructions.". I kindly explained to pt. that I was calling to check on her toe and to schedule visit w/ CPP. Patient declined to schedule CPP visit at this time. Pt. wants to speak to Dr. [name] about it first.   Total time spent: [place], [facility]

## 2021-10-27 ENCOUNTER — Ambulatory Visit (INDEPENDENT_AMBULATORY_CARE_PROVIDER_SITE_OTHER): Payer: Medicare Other | Admitting: Adult Health

## 2021-10-27 ENCOUNTER — Encounter: Payer: Self-pay | Admitting: Adult Health

## 2021-10-27 ENCOUNTER — Other Ambulatory Visit: Payer: Self-pay

## 2021-10-27 VITALS — BP 160/60 | HR 68 | Temp 97.7°F | Wt 184.0 lb

## 2021-10-27 DIAGNOSIS — L089 Local infection of the skin and subcutaneous tissue, unspecified: Secondary | ICD-10-CM

## 2021-10-27 MED ORDER — DOXYCYCLINE HYCLATE 100 MG PO CAPS: ORAL_CAPSULE | ORAL | 0 refills | Status: DC

## 2021-10-27 NOTE — Patient Instructions (Signed)
Doxycycline Capsules or Tablets ?What is this medication? ?DOXYCYCLINE (dox i SYE kleen) treats infections caused by bacteria. It belongs to a group of medications called tetracycline antibiotics. It will not treat colds, the flu, or infections caused by viruses. ?This medicine may be used for other purposes; ask your health care provider or pharmacist if you have questions. ?COMMON BRAND NAME(S): Acticlate, Adoxa, Adoxa CK, Adoxa Pak, Adoxa TT, Alodox, Avidoxy, Doxal, LYMEPAK, Mondoxyne NL, Monodox, Morgidox 1x, Morgidox 1x Kit, Morgidox 2x, Morgidox 2x Kit, NutriDox, Ocudox, Okebo, Periostat, TARGADOX, Vibra-Tabs, Vibramycin ?What should I tell my care team before I take this medication? ?They need to know if you have any of these conditions: ?Kidney disease ?Liver disease ?Long exposure to sunlight like working outdoors ?Recent stomach surgery ?Stomach or intestine problems such as colitis ?Vision Problems ?Yeast or fungal infection of the mouth or vagina ?An unusual or allergic reaction to doxycycline, tetracycline antibiotics, other medications, foods, dyes, or preservatives ?Pregnant or trying to get pregnant ?Breast-feeding ?How should I use this medication? ?Take this medication by mouth with water. Take it as directed on the prescription label at the same time every day. It is best to take this medication without food, but if it upsets your stomach take it with food. Take all of this medication unless your care team tells you to stop it early. Keep taking it even if you think you are better. ?Take antacids and products with aluminum, calcium, magnesium, iron, and zinc in them at a different time of day than this medication. Talk to your care team if you have questions. ?Talk to your care team regarding the use of this medication in children. While this medication may be prescribed for selected conditions, precautions do apply. ?Overdosage: If you think you have taken too much of this medicine contact a  poison control center or emergency room at once. ?NOTE: This medicine is only for you. Do not share this medicine with others. ?What if I miss a dose? ?If you miss a dose, take it as soon as you can. If it is almost time for your next dose, take only that dose. Do not take double or extra doses. ?What may interact with this medication? ?Antacids, vitamins, or other products that contain aluminum, calcium, iron, magnesium, or zinc ?Barbiturates ?Birth control pills ?Bismuth subsalicylate ?Carbamazepine ?Methoxyflurane ?Oral retinoids such as acitretin, isotretinoin ?Other antibiotics ?Phenytoin ?Warfarin ?This list may not describe all possible interactions. Give your health care provider a list of all the medicines, herbs, non-prescription drugs, or dietary supplements you use. Also tell them if you smoke, drink alcohol, or use illegal drugs. Some items may interact with your medicine. ?What should I watch for while using this medication? ?Tell your care team if your symptoms do not improve. ?Do not treat diarrhea with over the counter products. Contact your care team if you have diarrhea that lasts more than 2 days or if it is severe and watery. ?Do not take this medication just before going to bed. It may not dissolve properly when you lay down and can cause pain in your throat. Drink plenty of fluids while taking this medication to also help reduce irritation in your throat. ?This medication can make you more sensitive to the sun. Keep out of the sun. If you cannot avoid being in the sun, wear protective clothing and use sunscreen. Do not use sun lamps or tanning beds/booths. ?Birth control pills may not work properly while you are taking this medication. Talk to your   care team about using an extra method of birth control. ?If you are being treated for a sexually transmitted infection, avoid sexual contact until you have finished your treatment. Your sexual partner may also need treatment. ?If you are using this  medication to prevent malaria, you should still protect yourself from contact with mosquitos. Stay in screened-in areas, use mosquito nets, keep your body covered, and use an insect repellent. ?What side effects may I notice from receiving this medication? ?Side effects that you should report to your care team as soon as possible: ?Allergic reactions--skin rash, itching, hives, swelling of the face, lips, tongue, or throat ?Increased pressure around the brain--severe headache, change in vision, blurry vision, nausea, vomiting ?Joint pain ?Pain or trouble swallowing ?Redness, blistering, peeling, or loosening of the skin, including inside the mouth ?Severe diarrhea, fever ?Unusual vaginal discharge, itching, or odor ?Side effects that usually do not require medical attention (report these to your care team if they continue or are bothersome): ?Change in tooth color ?Diarrhea ?Headache ?Heartburn ?Nausea ?This list may not describe all possible side effects. Call your doctor for medical advice about side effects. You may report side effects to FDA at 1-800-FDA-1088. ?Where should I keep my medication? ?Keep out of the reach of children and pets. ?Store at room temperature, below 30 degrees C (86 degrees F). Protect from light. Keep container tightly closed. Throw away any unused medication after the expiration date. Taking this medication after the expiration date can make you seriously ill. ?NOTE: This sheet is a summary. It may not cover all possible information. If you have questions about this medicine, talk to your doctor, pharmacist, or health care provider. ?? 2022 Elsevier/Gold Standard (2020-11-22 00:00:00) ? ?

## 2021-10-27 NOTE — Progress Notes (Signed)
Assessment and Plan:  [name] was seen today for leg injury.  Diagnoses and all orders for this visit:  Infected wound Start abx, close monitoring, folllow up in 10 days for recheck or sooner if getting worse/not improving -     doxycycline (VIBRAMYCIN) 100 MG capsule; Take 1 capsule 2 x/day with food for 10 days.   Further disposition pending results of labs. Discussed med's effects and SE's.   Over 15 minutes of exam, counseling, chart review, and critical decision making was performed.   Future Appointments  Date Time Provider [place]  10/27/2021  8:45 AM Liane Comber, NP [facility] None  12/15/2021 10:30 AM Liane Comber, NP [facility] None  08/04/2022  2:00 PM Magda Bernheim, NP [facility] None    ------------------------------------------------------------------------------------------------------------------   HPI BP (!) 160/60   Pulse 68   Temp 97.7 F (36.5 C)   Wt 184 lb (83.5 kg)   SpO2 97%   BMI 29.70 kg/m  76 y.o.female with T2DM on insulin presents for follow up on leg injury she sustained on 10/06/2021, was seen in ED at that time. Had mechanical fall tripping on a chair leg. She had head/neck CT were declined, tetanus was updated. Xray of LLE were unremarkable.   She reports initially was doing well with washing with warm soapy water and covering with dressing. She had noted localized swelling and some redness-   She reports has been seen by a neuropathy treatment center and getting light laser therapy treatments that had been helping, she reports saw some purulent discharge after treatments that was improving, but reports yesterday was advised looking infected and to reach out to [place] for treatment.   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   Anxiety    Arthritis    Cataract    Chronic kidney disease    CKD (chronic  kidney disease), stage IV ([place])    Colon polyps    Depression    Diabetes mellitus 1998   Diverticulosis    Emphysema of lung ([place])    ESOPHAGEAL STRICTURE 08/27/2008   GERD (gastroesophageal reflux disease)    Hemorrhoids    Hiatal hernia    Hyperlipidemia    Hypertension 2000   Migraines    Peripheral vascular disease ([facility])    Phlebitis    30 years ago  left leg   Stroke [facility])    multiple mini strokes ( brain aneurysm )     Allergies  Allergen Reactions   Ciprofloxacin Other (See Comments)   Ozempic (0.25 Or 0.5 Mg-Dose) [Semaglutide(0.25 Or 0.66m-Dos)] Nausea And Vomiting   Plavix [Clopidogrel Bisulfate] Palpitations   Amoxicillin Itching and Swelling    FACE & EYES SWELL   Ace Inhibitors    Ciprofloxacin Hcl Swelling   Lyrica [Pregabalin]     Itch, gain weight   Penicillins Other (See Comments)    REACTION: unspecified    Current Outpatient Medications on File Prior to Visit  Medication Sig   amLODipine (NORVASC) 10 MG tablet TAKE 1 TABLET DAILY FOR BLOOD PRESSURE   aspirin 325 MG EC tablet Take 325 mg by mouth daily.   Blood Glucose Monitoring Suppl (ONETOUCH VERIO FLEX SYSTEM) w/Device KIT USE AS ADVISED   chlorthalidone (HYGROTON) 25 MG tablet Take 12.5 mg by mouth daily.   Cholecalciferol (VITAMIN D PO) Take 5,000 Units by  mouth daily.   citalopram (CELEXA) 40 MG tablet TAKE 1 TABLET DAILY FOR MOOD & ANXIETY   FARXIGA 10 MG TABS tablet TAKE 10 MG BY MOUTH DAILY BEFORE BREAKFAST.   glucose blood test strip Use as instructed 3x a day   Insulin Pen Needle 29G X 5MM MISC Use with insulin pen 3 times a day.   Magnesium 500 MG TABS Take 500 mg by mouth daily.   olmesartan (BENICAR) 40 MG tablet TAKE 1 TABLET DAILY FOR BLOOD PRESSURE   Omega-3 Fatty Acids (FISH OIL) 1200 MG CAPS Take 1,200 capsules by mouth daily.    omeprazole (PRILOSEC) 40 MG capsule Take 1 capsule (40 mg total) by mouth in the morning and at bedtime.   OneTouch Delica Lancets 19R MISC Use 3x a day    rOPINIRole (REQUIP) 0.25 MG tablet Take 1 tablet (0.25 mg total) by mouth at bedtime.   rosuvastatin (CRESTOR) 40 MG tablet TAKE 1 TABLET BY MOUTH EVERY DAY FOR CHOLESTEROL   TRESIBA FLEXTOUCH 200 UNIT/ML FlexTouch Pen INJECT 34 UNITS INTO THE SKIN DAILY.   No current facility-administered medications on file prior to visit.    ROS: all negative except above.   Physical Exam:  BP (!) 160/60   Pulse 68   Temp 97.7 F (36.5 C)   Wt 184 lb (83.5 kg)   SpO2 97%   BMI 29.70 kg/m   General Appearance: Well nourished, in no apparent distress. Eyes: PERRL, conjunctiva no swelling or erythema ENT/Mouth: mask in place; Hearing normal.  Neck: Supple Respiratory: Respiratory effort normal Cardio: RRR with no MRGs. Intact peripheral pulses without edema.  Lymphatics: Non tender without lymphadenopathy.  Musculoskeletal: no obvious deformity; normal gait.  Skin: Warm, dry; left lower inner leg/above ankle with 3 x 2 cm scabbed area with tender, warm erythematous mildly swollen area approx 10 cm surrounding Neuro: Normal muscle tone Psych: Awake and oriented X 3, normal affect, Insight and Judgment appropriate.      Izora Ribas, NP 8:44 AM [facility]

## 2021-10-28 ENCOUNTER — Encounter: Payer: Self-pay | Admitting: Adult Health

## 2021-10-28 ENCOUNTER — Other Ambulatory Visit: Payer: Self-pay | Admitting: Internal Medicine

## 2021-11-06 NOTE — Progress Notes (Addendum)
Assessment and Plan:  [name] was seen today for follow-up.  Diagnoses and all orders for this visit:  Infected traumatic leg ulcer, left with fat layer exposed ([place]) High risk diabetic, smoker, PAD/PVD, neuropathy, CKD 4 with progressive infected left medial ankle ulcerated wound despite doxycycline Unable to express purulent discharge for culture today but with concern with eschar, tunneling (see attached photo); recommend debridement, very high risk for complications/poor wound healing demonstrated, surgical intervention may be necessary Urgent referral - Dr. Jess Barters PA is able to see her tomorrow, scheduled. Continue vitamin D, encouraged vit C and zinc supplementation, smoking cessation  -     cephALEXin (KEFLEX) 500 MG capsule; Take 1 capsule (500 mg total) by mouth 3 (three) times daily for 10 days. -     Ambulatory referral to orthopedic surgery -     CBC with Differential/Platelet  Essential hypertension -     CBC with Differential/Platelet -     BASIC METABOLIC PANEL WITH GFR  Medication management -     CBC with Differential/Platelet -     BASIC METABOLIC PANEL WITH GFR  Other orders -     meclizine (ANTIVERT) 25 MG tablet; 1/2 tab up to 3 times daily for motion sickness/dizziness.   Further disposition pending results of labs. Discussed med's effects and SE's.   Over 20 minutes of exam, counseling, chart review, and critical decision making was performed.   Future Appointments  Date Time Provider [place]  11/11/2021  1:45 PM Suzan Slick, NP [facility] None  11/23/2021 12:50 PM [facility] ECHO 2 [id number]  12/15/2021 10:30 AM Liane Comber, NP [facility] None  01/11/2022 10:00 AM Minus Breeding, MD [facility]  08/04/2022  2:00 PM Magda Bernheim, NP [facility] None    ------------------------------------------------------------------------------------------------------------------   HPI BP (!) 160/64    Pulse 65    Temp 97.7 F (36.5 C)     Wt 184 lb (83.5 kg)    SpO2 98%    BMI 29.70 kg/m  76 y.o.female with T2DM with neuropathy. CKD 4, PVD/PAD, on insulin presents for follow up on leg injury she sustained on 10/06/2021, was seen in ED, mechanical fall tripping on a chair leg. tetanus was updated. Xray of LLE were unremarkable.   She reports had been seen by a neuropathy treatment center and getting light laser therapy treatments that had been helping, she reports saw some purulent discharge after treatments that was improving, but then noted a turn for the worse and was evaluated on 10/27/2021 with signs of mild local infection/cellulitis, doxycycline was started with 10 day follow up recommended.   Doxycycline was added at last visit due to concern with cellulitis, completed 10 day course but follow up was delayed, patient today reports much worse, didn't note much improvement with addition of antibiotic. 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   Anxiety    Arthritis    Cataract    Chronic kidney disease    CKD (chronic kidney disease), stage IV ([place])    Colon polyps    Depression    Diabetes mellitus 1998   Diverticulosis    Emphysema of lung ([place])    ESOPHAGEAL STRICTURE  08/27/2008   GERD (gastroesophageal reflux disease)    Hemorrhoids    Hiatal hernia    Hyperlipidemia    Hypertension 2000   Migraines    Peripheral vascular disease (HCC)    Phlebitis    30 years ago  left leg   Stroke [facility])    multiple mini strokes ( brain aneurysm )     Allergies  Allergen Reactions   Ciprofloxacin Other (See Comments)   Ozempic (0.25 Or 0.5 Mg-Dose) [Semaglutide(0.25 Or 0.70m-Dos)] Nausea And Vomiting   Plavix [Clopidogrel Bisulfate] Palpitations   Amoxicillin Itching and  Swelling    FACE & EYES SWELL   Ace Inhibitors    Ciprofloxacin Hcl Swelling   Lyrica [Pregabalin]     Itch, gain weight   Penicillins Other (See Comments)    REACTION: unspecified    Current Outpatient Medications on File Prior to Visit  Medication Sig   amLODipine (NORVASC) 10 MG tablet TAKE 1 TABLET DAILY FOR BLOOD PRESSURE   aspirin 325 MG EC tablet Take 325 mg by mouth daily.   Blood Glucose Monitoring Suppl (ONETOUCH VERIO FLEX SYSTEM) w/Device KIT USE AS ADVISED   chlorthalidone (HYGROTON) 25 MG tablet Take 12.5 mg by mouth daily.   Cholecalciferol (VITAMIN D PO) Take 5,000 Units by mouth daily.   citalopram (CELEXA) 40 MG tablet TAKE 1 TABLET DAILY FOR MOOD & ANXIETY   FARXIGA 10 MG TABS tablet TAKE 1 TABLET BY MOUTH EVERY DAY BEFORE BREAKFAST.   glucose blood test strip Use as instructed 3x a day   Insulin Pen Needle 29G X 5MM MISC Use with insulin pen 3 times a day.   Magnesium 500 MG TABS Take 500 mg by mouth daily.   olmesartan (BENICAR) 40 MG tablet TAKE 1 TABLET DAILY FOR BLOOD PRESSURE   Omega-3 Fatty Acids (FISH OIL) 1200 MG CAPS Take 1,200 capsules by mouth daily.    omeprazole (PRILOSEC) 40 MG capsule Take 1 capsule (40 mg total) by mouth in the morning and at bedtime.   OneTouch Delica Lancets 340JMISC Use 3x a day   rOPINIRole (REQUIP) 0.25 MG tablet Take 1 tablet (0.25 mg total) by mouth at bedtime.   rosuvastatin (CRESTOR) 40 MG tablet TAKE 1 TABLET BY MOUTH EVERY DAY FOR CHOLESTEROL   TRESIBA FLEXTOUCH 200 UNIT/ML FlexTouch Pen INJECT 34 UNITS INTO THE SKIN DAILY.   doxycycline (VIBRAMYCIN) 100 MG capsule Take 1 capsule 2 x/day with food for 10 days.   No current facility-administered medications on file prior to visit.   Allergies:  Allergies  Allergen Reactions   Ciprofloxacin Other (See Comments)   Ozempic (0.25 Or 0.5 Mg-Dose) [Semaglutide(0.25 Or 0.519mDos)] Nausea And Vomiting   Plavix [Clopidogrel Bisulfate] Palpitations   Amoxicillin Itching and  Swelling    FACE & EYES SWELL   Ace Inhibitors    Ciprofloxacin Hcl Swelling   Lyrica [Pregabalin]     Itch, gain weight   Penicillins Other (See Comments)    REACTION: unspecified    Surgical History:  She  has a past surgical history that includes Cesarean section; Cholecystectomy; Knee surgery; rotator cuff surgery; Thyroid surgery; Hernia repair; Abdominal hysterectomy (1984); Femoral artery stent (05/11/11); Dental surgery (Aug. 16, 2013); Eye surgery (Nov. 2014); Esophagogastroduodenoscopy (07/2010); Colonoscopy (07/2010); abdominal aortagram (N/A, 01/04/2012); abdominal aortagram (N/A, 08/15/2012); lower extremity angiogram (Left, 08/15/2012); cataract surgery (Right, 10-22-2015); and cataract surgery (Left, 11-05-2015). Family History:  Herfamily history includes CAD (age of onset: 6055in her brother; CAD (age of onset: 6111  in her father and mother; Diabetes in her sister; Heart attack in her brother, brother, father, and mother; Heart disease in her brother, brother, father, and mother; Hypertension in her mother and sister; Stroke in her sister. Social History:   reports that she has been smoking cigarettes. She has a 20.00 pack-year smoking history. She has never used smokeless tobacco. She reports that she does not drink alcohol and does not use drugs.   ROS: all negative except above.   Physical Exam:  BP (!) 160/64    Pulse 65    Temp 97.7 F (36.5 C)    Wt 184 lb (83.5 kg)    SpO2 98%    BMI 29.70 kg/m   General Appearance: Well nourished, in no apparent distress. Eyes: PERRL, conjunctiva no swelling or erythema ENT/Mouth: mask in place; Hearing normal.  Neck: Supple Respiratory: Respiratory effort normal Cardio: RRR with no MRGs. Intact peripheral pulses without edema.  Lymphatics: Non tender without lymphadenopathy.  Musculoskeletal: no obvious deformity; normal gait.  Skin: Warm, dry; left lower inner leg/above ankle with 2.5 cm x 2 cm x 0.5 cm with mild tunneling  0.5 mm anteriorly, fatty layer visible, ? Eschar tissue at border, very tender, warm erythematous mildly swollen area approx 10 cm surrounding Neuro: Normal muscle tone Psych: Awake and oriented X 3, normal affect, Insight and Judgment appropriate.       Izora Ribas, NP 4:42 PM [facility]

## 2021-11-09 ENCOUNTER — Telehealth: Payer: Self-pay | Admitting: Cardiology

## 2021-11-09 NOTE — Telephone Encounter (Signed)
Pt c/o of Chest Pain: STAT if CP now or developed within 24 hours  1. Are you having CP right now? Chest discomfort  2. Are you experiencing any other symptoms (ex. SOB, nausea, vomiting, sweating)?  real dizzy, a little nausea  3. How long have you been experiencing CP?  Last week and half  4. Is your CP continuous or coming and going? constant  5. Have you taken Nitroglycerin? never had any- pt wanted to be seen- made her an appointment for tomorrow with Laurann Montana- please call to evaluate?

## 2021-11-09 NOTE — Telephone Encounter (Signed)
Patient was returning call. Please advise ?

## 2021-11-09 NOTE — Telephone Encounter (Signed)
Spoke with pt, she reports having covid several weeks ago and the chest heaviness has been ongoing since then. She is currently in another office for therapy and did not really want to talk. She reports it has been a while since she saw dr hochrein and wanted to get check up. The dizziness she has is all the time and she reports inner ear issues as well. She is fine with the appointment tomorrow and did not need anything when I called.

## 2021-11-09 NOTE — Telephone Encounter (Signed)
Left message for pt to call.

## 2021-11-09 NOTE — Progress Notes (Signed)
Office Visit    Patient Name: [name] Date of Encounter: 11/10/2021  PCP:  [name], [facility]  Cardiologist:  Minus Breeding, MD  Advanced Practice Provider:  No care team member to display Electrophysiologist:  None      Chief Complaint    [name] is a 76 y.o. female with a hx of PVC with prior stenting, aortic atherosclerosis, coronary artery calcification by CT  presents today for chest pain.   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   Anxiety    Arthritis    Cataract    Chronic kidney disease    CKD (chronic kidney disease), stage IV ([place])    Colon polyps    Depression    Diabetes mellitus 1998   Diverticulosis    Emphysema of lung ([place])    ESOPHAGEAL STRICTURE 08/27/2008   GERD (gastroesophageal reflux disease)    Hemorrhoids    Hiatal hernia    Hyperlipidemia    Hypertension 2000   Migraines    Peripheral vascular disease (HCC)    Phlebitis    30 years ago  left leg   Stroke [facility])    multiple mini strokes ( brain aneurysm )   Past Surgical History:  Procedure Laterality Date   ABDOMINAL AORTAGRAM N/A 01/04/2012   Procedure: ABDOMINAL Maxcine Ham;  Surgeon: Serafina Mitchell, MD;  Location: [facility] CATH LAB;  Service: Cardiovascular;  Laterality: N/A;   ABDOMINAL AORTAGRAM N/A 08/15/2012   Procedure: ABDOMINAL Maxcine Ham;  Surgeon: Serafina Mitchell, MD;  Location: [facility] CATH LAB;  Service: Cardiovascular;  Laterality: N/A;   ABDOMINAL HYSTERECTOMY  1984   cataract surgery Right 10-22-2015   cataract surgery Left 11-05-2015   CESAREAN SECTION     CHOLECYSTECTOMY     COLONOSCOPY  07/2010   DENTAL SURGERY  Aug. 16, 2013   left lower    ESOPHAGOGASTRODUODENOSCOPY  07/2010   EYE SURGERY  Nov. 2014   Laser-Glaucoma   FEMORAL ARTERY STENT  05/11/11   Left  superficial femoral and popliteal artery   HERNIA REPAIR     times two   KNEE SURGERY     LOWER EXTREMITY ANGIOGRAM Left 08/15/2012   Procedure: LOWER EXTREMITY ANGIOGRAM;  Surgeon: Serafina Mitchell, MD;  Location: [facility] C[facility] LAB;  Service: Cardiovascular;  Laterality: Left;  lt leg angio   rotator cuff surgery     THYROID SURGERY      Allergies  Allergies  Allergen Reactions   Ciprofloxacin Other (See Comments)   Ozempic (0.25 Or 0.5 Mg-Dose) [Semaglutide(0.25 Or 0.22m-Dos)] Nausea And Vomiting   Plavix [Clopidogrel Bisulfate] Palpitations   Amoxicillin Itching and Swelling    FACE & EYES SWELL   Ace Inhibitors    Ciprofloxacin Hcl Swelling   Lyrica [Pregabalin]     Itch, gain weight   Penicillins Other (See Comments)    REACTION: unspecified    History of Present Illness    Karen MUCHAis a 76y.o. female with a hx of PVC with prior stenting, aortic atherosclerosis, coronary artery calcification by CT last seen 12/2019 by Dr. HPercival Spanish  Prior testing includes echo 2018 with normal LVEF. March 2020 negative lexiscan myoview. Follows with [facility] for lung pass. Previous CTs have shown coronary artery  atherosclerosis and aortic atherosclerosis.   When last seen 12/31/19 she wore a 3 day monitor due to palpitations. It showed short runs of SVT with no atrial fibrillation. No changes were made.  She contacted the office yesterday noting chest pain. She spoke with RN and reported COVID several weeks prior with chest heaviness ongoing since that time. She noted dizziness was ongoing and attributed to inner ear issues.   She presents today for follow up. 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 Tells me she is just not sure about her chest pain. Her mom, dad, and both her brother have all had heart attacks. She also just turned 76 years old and is understandably concerned. One month ago she and her husband had COVID. They had all of their  vaccinations so thankfully mild symptoms. Tells me her chest feels tight and her breathing feels short when she lays down. No edema, PND. She also has been having dizziness described as the room spinning. She does have inner ear problems and wonders if she has crystals again as this feels similar. Does note she was on abx for an injury from a fall in January and felt dizzy on the antibiotic as well.    EKGs/Labs/Other Studies Reviewed:   The following studies were reviewed today:   EKG:  EKG is ordered today.  The ekg ordered today demonstrates NSR 69 bpm with no acute ST/T wave changes.   Recent Labs: 08/04/2021: ALT 16; BUN 32; Creat 1.93; Hemoglobin 15.2; Magnesium 2.2; Platelets 247; Potassium 4.4; Sodium 140; TSH 1.60  Recent Lipid Panel    Component Value Date/Time   CHOL 130 08/04/2021 1516   TRIG 141 08/04/2021 1516   HDL 43 (L) 08/04/2021 1516   CHOLHDL 3.0 08/04/2021 1516   VLDL 21.8 10/08/2019 1554   LDLCALC 65 08/04/2021 1516   LDLDIRECT 146.4 01/30/2010 1056    Home Medications   Current Meds  Medication Sig   amLODipine (NORVASC) 10 MG tablet TAKE 1 TABLET DAILY FOR BLOOD PRESSURE   aspirin 325 MG EC tablet Take 325 mg by mouth daily.   Blood Glucose Monitoring Suppl (ONETOUCH VERIO FLEX SYSTEM) w/Device KIT USE AS ADVISED   chlorthalidone (HYGROTON) 25 MG tablet Take 12.5 mg by mouth daily.   Cholecalciferol (VITAMIN D PO) Take 5,000 Units by mouth daily.   citalopram (CELEXA) 40 MG tablet TAKE 1 TABLET DAILY FOR MOOD & ANXIETY   doxycycline (VIBRAMYCIN) 100 MG capsule Take 1 capsule 2 x/day with food for 10 days.   FARXIGA 10 MG TABS tablet TAKE 1 TABLET BY MOUTH EVERY DAY BEFORE BREAKFAST.   glucose blood test strip Use as instructed 3x a day   insulin aspart (NOVOLOG FLEXPEN) 100 UNIT/ML FlexPen 40 Units daily.   Insulin Pen Needle 29G X 5MM MISC Use with insulin pen 3 times a day.   Magnesium 500 MG TABS Take 500 mg by mouth daily.   olmesartan (BENICAR) 40  MG tablet TAKE 1 TABLET DAILY FOR BLOOD PRESSURE   Omega-3 Fatty Acids (FISH OIL) 1200 MG CAPS Take 1,200 capsules by mouth daily.    omeprazole (PRILOSEC) 40 MG capsule Take 1 capsule (40 mg total) by mouth in the morning and at bedtime.   OneTouch Delica Lancets 91Y MISC Use 3x a day   rOPINIRole (REQUIP) 0.25 MG tablet Take 1 tablet (0.25 mg total) by mouth at bedtime.   rosuvastatin (CRESTOR) 40 MG tablet TAKE 1 TABLET  BY MOUTH EVERY DAY FOR CHOLESTEROL   TRESIBA FLEXTOUCH 200 UNIT/ML FlexTouch Pen INJECT 34 UNITS INTO THE SKIN DAILY.     Review of Systems     All other systems reviewed and are otherwise negative except as noted above.  Physical Exam    VS:  BP (!) 142/58    Pulse 69    Ht 5' 6" (1.676 m)    Wt 182 lb (82.6 kg)    SpO2 97%    BMI 29.38 kg/m  , BMI Body mass index is 29.38 kg/m.  Wt Readings from Last 3 Encounters:  11/10/21 182 lb (82.6 kg)  10/27/21 184 lb (83.5 kg)  10/06/21 184 lb (83.5 kg)     GEN: Well nourished, well developed, in no acute distress. HEENT: normal. Neck: Supple, no JVD, carotid bruits, or masses. Cardiac: RRR, no murmurs, rubs, or gallops. No clubbing, cyanosis, edema.  Radials/PT 2+ and equal bilaterally.  Respiratory:  Respirations regular and unlabored, clear to auscultation bilaterally. GI: Soft, nontender, nondistended. MS: No deformity or atrophy. Skin: Warm and dry, no rash. Neuro:  Strength and sensation are intact. Psych: Normal affect.  Assessment & Plan    Aortic atherosclerosis / Coronary artery calcification by CT / Chest pain - Reports having COVID 09/2021 and chest tightness on occasion at rest and with exertion since that time. EKG today NSR no acute ST/T wave changes. She is understandably concerned given her family history of coronary disease. Symptoms overall atypical for angina due to occurring at rest. Likely pleuritic, lung sounds clear. Unable to perform cardiac CTA due to CKD IV. Will order echocardiogram to  assess heart function and rule out wall motion abnormalities. If notes, consider myoview.   HTN - BP elevated. Not checking routinely at home - encouraged to do so. BP goal <130/80. Current regimen includes Amlodipine 85m QD, Chlorthalidone 223mQOD (per PCP), Olmesartan 4017mD. With dizziness, hesitant to increase Chlorthalidone. If BP persistently elevated could consider Hydralazine 85m48mD.   Dizziness - Described as spinning likely BPPV. Noted some relief with a motion sickness pill. Encouraged to discuss with PCP. Low suspicion cardiac etiology as no lightheadedness, near syncope however updating echo, as detailed above, will rule out valvular abnormalities.   CKD IV - Careful titration of diuretic and antihypertensive.  Follows with nephrology.   HLD - 08/2021 LDL 65. Continue Rosuvastatin 40mg59m  DM2 - Continue to follow with PCP. Has been participating in wellness program, losing weight, changing dietary habits. Excited to see what her A1c is when she sees Dr. GhergRenne Criglerndocrinology for follow up. Appreciate inclusion of SGLT2i for cardioprotective benefit.   Tobacco abuse - Smoking cessation encouraged. Recommend utilization of 1800QUITNOW.   PVD - Follows with vascular surgery. No symptoms of claudications. LLE wound followed by PCP after fall in January without signs of infection.  Disposition: Follow up in 2 month(s) with JamesMinus Breedingor APP.  Signed, CaitlLoel Dubonnet2/21/2023, 9:08 AM [facility]

## 2021-11-10 ENCOUNTER — Ambulatory Visit (INDEPENDENT_AMBULATORY_CARE_PROVIDER_SITE_OTHER): Payer: Medicare Other | Admitting: Adult Health

## 2021-11-10 ENCOUNTER — Encounter: Payer: Self-pay | Admitting: Adult Health

## 2021-11-10 ENCOUNTER — Ambulatory Visit (INDEPENDENT_AMBULATORY_CARE_PROVIDER_SITE_OTHER): Payer: Medicare Other | Admitting: Family

## 2021-11-10 ENCOUNTER — Other Ambulatory Visit: Payer: Self-pay

## 2021-11-10 ENCOUNTER — Encounter (HOSPITAL_BASED_OUTPATIENT_CLINIC_OR_DEPARTMENT_OTHER): Payer: Self-pay | Admitting: Family

## 2021-11-10 VITALS — BP 160/64 | HR 65 | Temp 97.7°F | Wt 184.0 lb

## 2021-11-10 VITALS — BP 142/58 | HR 69 | Ht 66.0 in | Wt 182.0 lb

## 2021-11-10 DIAGNOSIS — Z720 Tobacco use: Secondary | ICD-10-CM

## 2021-11-10 DIAGNOSIS — I700 Atherosclerosis of aorta: Secondary | ICD-10-CM

## 2021-11-10 DIAGNOSIS — L089 Local infection of the skin and subcutaneous tissue, unspecified: Secondary | ICD-10-CM | POA: Insufficient documentation

## 2021-11-10 DIAGNOSIS — Z794 Long term (current) use of insulin: Secondary | ICD-10-CM

## 2021-11-10 DIAGNOSIS — E1159 Type 2 diabetes mellitus with other circulatory complications: Secondary | ICD-10-CM

## 2021-11-10 DIAGNOSIS — R0600 Dyspnea, unspecified: Secondary | ICD-10-CM | POA: Diagnosis not present

## 2021-11-10 DIAGNOSIS — T148XXA Other injury of unspecified body region, initial encounter: Secondary | ICD-10-CM | POA: Insufficient documentation

## 2021-11-10 DIAGNOSIS — E785 Hyperlipidemia, unspecified: Secondary | ICD-10-CM | POA: Diagnosis not present

## 2021-11-10 DIAGNOSIS — Z79899 Other long term (current) drug therapy: Secondary | ICD-10-CM | POA: Diagnosis not present

## 2021-11-10 DIAGNOSIS — I779 Disorder of arteries and arterioles, unspecified: Secondary | ICD-10-CM

## 2021-11-10 DIAGNOSIS — N184 Chronic kidney disease, stage 4 (severe): Secondary | ICD-10-CM | POA: Diagnosis not present

## 2021-11-10 DIAGNOSIS — I10 Essential (primary) hypertension: Secondary | ICD-10-CM

## 2021-11-10 DIAGNOSIS — R079 Chest pain, unspecified: Secondary | ICD-10-CM | POA: Diagnosis not present

## 2021-11-10 DIAGNOSIS — L97922 Non-pressure chronic ulcer of unspecified part of left lower leg with fat layer exposed: Secondary | ICD-10-CM | POA: Diagnosis not present

## 2021-11-10 LAB — CBC WITH DIFFERENTIAL/PLATELET
Absolute Monocytes: 756 cells/uL (ref 200–950)
Basophils Absolute: 63 cells/uL (ref 0–200)
Basophils Relative: 0.7 %
Eosinophils Absolute: 180 cells/uL (ref 15–500)
Eosinophils Relative: 2 %
HCT: 45.2 % — ABNORMAL HIGH (ref 35.0–45.0)
Hemoglobin: 15.1 g/dL (ref 11.7–15.5)
Lymphs Abs: 3006 cells/uL (ref 850–3900)
MCH: 33.3 pg — ABNORMAL HIGH (ref 27.0–33.0)
MCHC: 33.4 g/dL (ref 32.0–36.0)
MCV: 99.8 fL (ref 80.0–100.0)
MPV: 11.2 fL (ref 7.5–12.5)
Monocytes Relative: 8.4 %
Neutro Abs: 4995 cells/uL (ref 1500–7800)
Neutrophils Relative %: 55.5 %
Platelets: 260 10*3/uL (ref 140–400)
RBC: 4.53 10*6/uL (ref 3.80–5.10)
RDW: 11.4 % (ref 11.0–15.0)
Total Lymphocyte: 33.4 %
WBC: 9 10*3/uL (ref 3.8–10.8)

## 2021-11-10 LAB — BASIC METABOLIC PANEL WITH GFR
BUN/Creatinine Ratio: 15 (calc) (ref 6–22)
BUN: 27 mg/dL — ABNORMAL HIGH (ref 7–25)
CO2: 31 mmol/L (ref 20–32)
Calcium: 9.9 mg/dL (ref 8.6–10.4)
Chloride: 105 mmol/L (ref 98–110)
Creat: 1.79 mg/dL — ABNORMAL HIGH (ref 0.60–1.00)
Glucose, Bld: 91 mg/dL (ref 65–99)
Potassium: 4.7 mmol/L (ref 3.5–5.3)
Sodium: 143 mmol/L (ref 135–146)
eGFR: 29 mL/min/{1.73_m2} — ABNORMAL LOW (ref >=60)

## 2021-11-10 MED ORDER — CEPHALEXIN 500 MG PO CAPS: 500.0000 mg | ORAL_CAPSULE | Freq: Three times a day (TID) | ORAL | 0 refills | Status: DC

## 2021-11-10 MED ORDER — MECLIZINE HCL 25 MG PO TABS: ORAL_TABLET | ORAL | 0 refills | Status: DC

## 2021-11-10 NOTE — Patient Instructions (Addendum)
Medication Instructions:  Your Physician recommend you continue on your current medication as directed.    *If you need a refill on your cardiac medications before your next appointment, please call your pharmacy*   Lab Work: None ordered today   Testing/Procedures: Your physician has requested that you have an echocardiogram. Echocardiography is a painless test that uses sound waves to create images of your heart. It provides your doctor with information about the size and shape of your heart and how well your hearts chambers and valves are working. This procedure takes approximately one hour. There are no restrictions for this procedure. [place], you and your health needs are our priority.  As part of our continuing mission to provide you with exceptional heart care, we have created designated Provider Care Teams.  These Care Teams include your primary Cardiologist (physician) and Advanced Practice Providers (APPs -  Physician Assistants and Nurse Practitioners) who all work together to provide you with the care you need, when you need it.  We recommend signing up for the patient portal called "MyChart".  Sign up information is provided on this After Visit Summary.  MyChart is used to connect with patients for Virtual Visits (Telemedicine).  Patients are able to view lab/test results, encounter notes, upcoming appointments, etc.  Non-urgent messages can be sent to your provider as well.   To learn more about what you can do with MyChart, go to [email or web address].    Your next appointment:   Follow up as scheduled with Dr. Percival Spanish in April   Other Instructions   Tips to Measure your Blood Pressure Correctly  To determine whether you have hypertension, a medical professional will take a blood pressure reading. How you prepare for the test, the position of your arm, and other factors can change a blood pressure reading by  10% or more. That could be enough to hide high blood pressure, start you on a drug you don't really need, or lead your doctor to incorrectly adjust your medications.  National and international guidelines offer specific instructions for measuring blood pressure. If a doctor, nurse, or medical assistant isn't doing it right, don't hesitate to ask him or her to get with the guidelines.  Here's what you can do to ensure a correct reading:  Don't drink a caffeinated beverage or smoke during the 30 minutes before the test.  Sit quietly for five minutes before the test begins.  During the measurement, sit in a chair with your feet on the floor and your arm supported so your elbow is at about heart level.  The inflatable part of the cuff should completely cover at least 80% of your upper arm, and the cuff should be placed on bare skin, not over a shirt.  Don't talk during the measurement.   Blood pressure categories  Blood pressure category SYSTOLIC (upper number)  DIASTOLIC (lower number)  Normal Less than 120 mm Hg and Less than 80 mm Hg  Elevated 120-129 mm Hg and Less than 80 mm Hg  High blood pressure: Stage 1 hypertension 130-139 mm Hg or 80-89 mm Hg  High blood pressure: Stage 2 hypertension 140 mm Hg or higher or 90 mm Hg or higher  Hypertensive crisis (consult your doctor immediately) Higher than 180 mm Hg and/or Higher than 120 mm Hg  Source: American Heart Association and American Stroke Association. For more on getting your blood pressure under control, buy Controlling Your Blood Pressure,  a Special Health Report from [facility].   Blood Pressure Log   Date   Time  Blood Pressure  Position  Example: Nov 1 9 AM 124/78 sitting

## 2021-11-11 ENCOUNTER — Encounter: Payer: Self-pay | Admitting: Adult Health

## 2021-11-11 ENCOUNTER — Ambulatory Visit (INDEPENDENT_AMBULATORY_CARE_PROVIDER_SITE_OTHER): Payer: Medicare Other | Admitting: Family

## 2021-11-11 ENCOUNTER — Encounter: Payer: Self-pay | Admitting: Family

## 2021-11-11 DIAGNOSIS — L98499 Non-pressure chronic ulcer of skin of other sites with unspecified severity: Secondary | ICD-10-CM | POA: Diagnosis not present

## 2021-11-11 DIAGNOSIS — I739 Peripheral vascular disease, unspecified: Secondary | ICD-10-CM | POA: Diagnosis not present

## 2021-11-11 DIAGNOSIS — I779 Disorder of arteries and arterioles, unspecified: Secondary | ICD-10-CM

## 2021-11-11 DIAGNOSIS — L089 Local infection of the skin and subcutaneous tissue, unspecified: Secondary | ICD-10-CM | POA: Insufficient documentation

## 2021-11-11 DIAGNOSIS — I771 Stricture of artery: Secondary | ICD-10-CM | POA: Diagnosis not present

## 2021-11-11 NOTE — Progress Notes (Signed)
Office Visit Note   Patient: [name]           Date of Birth: 1946-03-01           MRN: [id number] Visit Date: 11/11/2021              Requested by: [name], [address] [place],  [place] [address] PCP: [name], MD  Chief Complaint  Patient presents with   Left Leg - Wound Check      HPI: The patient is a 76 year old woman referred for evaluation of left foot wound.  She originally sustained a skin tear on January 10/05/21 after getting her foot caught on a chair.  She has had difficulty with wound healing she completed a course of doxycycline last week.  She was seen by her primary care yesterday who prescribed a course of cephalexin and have also referred her here for possible surgical debridement  Of note ABIs which were performed on January 17 of this year showed severe peripheral arterial disease bilateral lower extremities  However the patient states that she is currently in the middle of a 14-week medical study for her lower extremity discomfort and vascular disease and she has been quite pleased with her improvement in sensation and pain  Assessment & Plan: Visit Diagnoses:  1. Arterial insufficiency with ischemic ulcer ([place])   2. Severe peripheral arterial disease (HCC)     Plan: Discussed at length wound care options as well as debridement options.  Patient at this point declined using the medical compression stocking we will hold off on using compressive wraps as she does have PAD.  She has elected to proceed with silver cell dressing changes daily after Dial soap cleansing.  Offered referral back to Dr. Trula Slade. She will begin her course of Keflex today. She is eager to proceed with surgical debridement we will go ahead and plan for surgery on Tuesday for debridement and possible skin grafting she will meet with Dr. Sharol Given in clinic on Monday  Follow-Up Instructions: Return in about 5 days (around 11/16/2021).   Ortho  Exam  Patient is alert, oriented, no adenopathy, well-dressed, normal affect, normal respiratory effort. On examination of the left lower extremity she does have trace edema to the lower leg and foot.  To the medial aspect of her calf she has a 15 mm in diameter ulceration this is 5 mm deep there is 50% eschar as well as some 20% fibrinous exudative tissue.  This does not probe to bone or tendon.  There is no tunneling.  There is some resolving cellulitis with dry peeling skin surrounding this Does have a palpable dorsalis pedis pulse on the left Imaging: No results found. No images are attached to the encounter.  Labs: Lab Results  Component Value Date   HGBA1C 8.5 (H) 08/04/2021   HGBA1C 8.7 (A) 06/08/2021   HGBA1C 8.4 (H) 12/15/2020   ESRSEDRATE 11 06/06/2018   ESRSEDRATE 12 05/01/2009   LABURIC 4.6 04/18/2018   REPTSTATUS 07/25/2017 FINAL 07/23/2017   CULT >=100,000 COLONIES/mL ESCHERICHIA COLI (A) 07/23/2017   LABORGA ESCHERICHIA COLI (A) 07/23/2017     Lab Results  Component Value Date   ALBUMIN 3.5 10/28/2020   ALBUMIN 3.0 (L) 01/27/2017   ALBUMIN 3.2 (L) 01/25/2017    Lab Results  Component Value Date   MG 2.2 08/04/2021   MG 2.5 08/04/2020   MG 1.9 11/13/2018   Lab Results  Component Value Date   VD25OH 21 (  L) 08/04/2020   VD25OH 27 (L) 04/18/2018   VD25OH 22 (L) 07/22/2016    No results found for: PREALBUMIN CBC EXTENDED Latest Ref Rng & Units 11/10/2021 08/04/2021 12/15/2020  WBC 3.8 - 10.8 Thousand/uL 9.0 9.2 11.2(H)  RBC 3.80 - 5.10 Million/uL 4.53 4.59 4.83  HGB 11.7 - 15.5 g/dL 15.1 15.2 15.4  HCT 35.0 - 45.0 % 45.2(H) 45.4(H) 48.1(H)  PLT 140 - 400 Thousand/uL 260 247 236  NEUTROABS 1,500 - 7,800 cells/uL 4,995 5,584 8,086(H)  LYMPHSABS 850 - 3,900 cells/uL 3,006 2,732 2,363     There is no height or weight on file to calculate BMI.  Orders:  No orders of the defined types were placed in this encounter.  No orders of the defined types were  placed in this encounter.    Procedures: No procedures performed  Clinical Data: No additional findings.  ROS:  All other systems negative, except as noted in the HPI. Review of Systems  Objective: Vital Signs: There were no vitals taken for this visit.  Specialty Comments:  No specialty comments available.  PMFS History: Patient Active Problem List   Diagnosis Date Noted   Infected traumatic leg ulcer, left, with fat layer exposed ([place]) 11/11/2021   Infected wound 11/10/2021   Pain in right knee 01/07/2020   Carotid artery stenosis 12/03/2019   Migraine with aura and without status migrainosus, not intractable 11/28/2019   Hyperlipidemia associated with type 2 diabetes mellitus ([place]) 11/10/2019   Coronary artery calcification 08/05/2019   PAOD (peripheral arterial occlusive disease) ([place]) 02/26/2019   SOB (shortness of breath) 11/17/2018   Cardiomegaly 11/17/2018   Emphysema of lung ([place]) 10/10/2017   Atherosclerosis of aorta ([place]) 10/10/2017   Lung nodule 10/10/2017   Anxiety 08/22/2017   Arthritis 08/22/2017   Neuropathy due to secondary diabetes ([place]) 08/22/2017   PVD (peripheral vascular disease) ([place]) 04/13/2017   Type 2 diabetes mellitus with circulatory disorder, with long-term current use of insulin ([place]) 11/07/2015   Encounter for Medicare annual wellness exam 06/24/2015   Generalized anxiety disorder 03/26/2015   Depression, major, recurrent, in partial remission ([place]) 03/26/2015   History of CVA (cerebrovascular accident) 10/04/2014   Tobacco abuse 10/04/2014   Vitamin D deficiency 11/02/2013   Medication management 11/02/2013   Peripheral vascular disease due to secondary diabetes mellitus ([place]) 05/08/2012   Atherosclerosis of native arteries of extremity with intermittent claudication 12/24/2011   Diverticulosis of large intestine 06/09/2010   History of colonic polyps 06/09/2010   Esophageal reflux 08/27/2008   Nontoxic multinodular goiter  03/11/2008   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[place]) 03/11/2008   Brain aneurysm 03/11/2008   CKD stage 4 due to type 2 diabetes mellitus ([place]) 05/01/2007   Essential hypertension 05/01/2007   Past Medical History:  Diagnosis Date   Abnormal findings on esophagogastroduodenoscopy (EGD) 07/2010   Aneurysm ([place]) 2013   Right Brain    Aneurysm ([place])    in brain x 2   Anxiety    Arthritis    Cataract    Chronic kidney disease    CKD (chronic kidney disease), stage IV ([place])    Colon polyps    Depression    Diabetes mellitus 1998   Diverticulosis    Emphysema of lung ([place])    ESOPHAGEAL STRICTURE 08/27/2008   GERD (gastroesophageal reflux disease)    Hemorrhoids    Hiatal hernia    Hyperlipidemia    Hypertension 2000   Migraines    Peripheral vascular disease ([place])  Phlebitis    30 years ago  left leg   Stroke [facility])    multiple mini strokes ( brain aneurysm )    Family History  Problem Relation Age of Onset   CAD Mother 59       Died of MI   Hypertension Mother    Heart attack Mother    Heart disease Mother    CAD Father 61       Died of MI   Heart disease Father    Heart attack Father    CAD Brother 23       Two brothers died of MI   Heart attack Brother    Heart disease Brother        Amputation   Diabetes Sister    Hypertension Sister    Heart attack Brother    Heart disease Brother    Stroke Sister    Colon cancer Neg Hx    Stomach cancer Neg Hx    Esophageal cancer Neg Hx     Past Surgical History:  Procedure Laterality Date   ABDOMINAL AORTAGRAM N/A 01/04/2012   Procedure: ABDOMINAL Maxcine Ham;  Surgeon: Serafina Mitchell, MD;  Location: [facility] CATH LAB;  Service: Cardiovascular;  Laterality: N/A;   ABDOMINAL AORTAGRAM N/A 08/15/2012   Procedure: ABDOMINAL Maxcine Ham;  Surgeon: Serafina Mitchell, MD;  Location: [facility] CATH LAB;  Service: Cardiovascular;  Laterality: N/A;   ABDOMINAL HYSTERECTOMY  1984   cataract surgery Right 10-22-2015   cataract surgery Left  11-05-2015   CESAREAN SECTION     CHOLECYSTECTOMY     COLONOSCOPY  07/2010   DENTAL SURGERY  Aug. 16, 2013   left lower    ESOPHAGOGASTRODUODENOSCOPY  07/2010   EYE SURGERY  Nov. 2014   Laser-Glaucoma   FEMORAL ARTERY STENT  05/11/11   Left superficial femoral and popliteal artery   HERNIA REPAIR     times two   KNEE SURGERY     LOWER EXTREMITY ANGIOGRAM Left 08/15/2012   Procedure: LOWER EXTREMITY ANGIOGRAM;  Surgeon: Serafina Mitchell, MD;  Location: [facility] CATH LAB;  Service: Cardiovascular;  Laterality: Left;  lt leg angio   rotator cuff surgery     THYROID SURGERY     Social History   Occupational History   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  Tobacco Use   Smoking status: Light Smoker    Packs/day: 0.50    Years: 40.00    Pack years: 20.00    Types: Cigarettes   Smokeless tobacco: Never   Tobacco comments:    11/18/20 in process of quiting  Vaping Use   Vaping Use: Never used  Substance and Sexual Activity   Alcohol use: No    Alcohol/week: 0.0 standard drinks   Drug use: No   Sexual activity: Not on file

## 2021-11-16 ENCOUNTER — Ambulatory Visit: Payer: Medicare Other | Admitting: Orthopedic Surgery

## 2021-11-16 ENCOUNTER — Other Ambulatory Visit: Payer: Self-pay

## 2021-11-16 ENCOUNTER — Encounter: Payer: Self-pay | Admitting: Surgery

## 2021-11-16 ENCOUNTER — Telehealth: Payer: Self-pay | Admitting: Orthopedic Surgery

## 2021-11-16 ENCOUNTER — Ambulatory Visit (INDEPENDENT_AMBULATORY_CARE_PROVIDER_SITE_OTHER): Payer: Medicare Other | Admitting: Orthopedic Surgery

## 2021-11-16 ENCOUNTER — Ambulatory Visit (INDEPENDENT_AMBULATORY_CARE_PROVIDER_SITE_OTHER): Payer: Medicare Other | Admitting: Surgery

## 2021-11-16 VITALS — BP 129/55 | HR 75 | Temp 98.3°F | Resp 20 | Ht 66.0 in | Wt 184.0 lb

## 2021-11-16 DIAGNOSIS — I70243 Atherosclerosis of 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: Secondary | ICD-10-CM | POA: Diagnosis not present

## 2021-11-16 DIAGNOSIS — Z419 Encounter for procedure for purposes other than remedying health state, unspecified: Secondary | ICD-10-CM

## 2021-11-16 DIAGNOSIS — I779 Disorder of arteries and arterioles, unspecified: Secondary | ICD-10-CM

## 2021-11-16 DIAGNOSIS — L98499 Non-pressure chronic ulcer of skin of other sites with unspecified severity: Secondary | ICD-10-CM

## 2021-11-16 DIAGNOSIS — I771 Stricture of artery: Secondary | ICD-10-CM | POA: Diagnosis not present

## 2021-11-16 NOTE — H&P (View-Only) (Signed)
[facility]  Patient name: [name] MRN: [id number] DOB: 12/30/1945 Sex: female   REASON FOR VISIT:   Follow up  [place] ILLNESS:    [name] is a 76 y.o. female who is here today as a urgent add-on for a leg wound.  I have seen her in the past for claudication.  She underwent stenting of her left superficial femoral artery in 2012 and developed early in-stent stenosis which was treated with angioplasty.  Her stent subsequently went on to occlude.  We have been managing her claudication symptoms.  She does have a history of smoking.  She recently developed a left ankle ulcer which is progressively getting worse  I have also followed her for carotid disease for which she remains asymptomatic.  She is medically managed for hypertension with an ARB.  She is treated for diabetes and is on a statin for hypercholesterolemia.   PAST MEDICAL HISTORY:   Past Medical History:  Diagnosis Date   Abnormal findings on esophagogastroduodenoscopy (EGD) 07/2010   Aneurysm ([place]) 2013   Right Brain    Aneurysm ([place])    in brain x 2   Anxiety    Arthritis    Cataract    Chronic kidney disease    CKD (chronic kidney disease), stage IV ([place])    Colon polyps    Depression    Diabetes mellitus 1998   Diverticulosis    Emphysema of lung ([place])    ESOPHAGEAL STRICTURE 08/27/2008   GERD (gastroesophageal reflux disease)    Hemorrhoids    Hiatal hernia    Hyperlipidemia    Hypertension 2000   Migraines    Peripheral vascular disease (HCC)    Phlebitis    30 years ago  left leg   Stroke ([place])    multiple mini strokes ( brain aneurysm )     FAMILY HISTORY:   Family History  Problem Relation Age of Onset   CAD Mother 50       Died of MI   Hypertension Mother    Heart attack Mother    Heart disease Mother    CAD Father 55       Died of MI   Heart disease Father    Heart attack Father    CAD  Brother 61       Two brothers died of MI   Heart attack Brother    Heart disease Brother        Amputation   Diabetes Sister    Hypertension Sister    Heart attack Brother    Heart disease Brother    Stroke Sister    Colon cancer Neg Hx    Stomach cancer Neg Hx    Esophageal cancer Neg Hx     SOCIAL HISTORY:   Social History   Tobacco Use   Smoking status: Light Smoker    Packs/day: 0.50    Years: 40.00    Pack years: 20.00    Types: Cigarettes   Smokeless tobacco: Never   Tobacco comments:    11/18/20 in process of quiting  Substance Use Topics   Alcohol use: No    Alcohol/week: 0.0 standard drinks     ALLERGIES:   Allergies  Allergen Reactions   Ciprofloxacin Other (See Comments)   Ozempic (0.25 Or 0.5 Mg-Dose) [Semaglutide(0.25 Or 0.73m-Dos)] Nausea And Vomiting   Plavix [Clopidogrel Bisulfate] Palpitations   Amoxicillin Itching and Swelling    FACE &  EYES SWELL   Ace Inhibitors    Ciprofloxacin Hcl Swelling   Lyrica [Pregabalin]     Itch, gain weight   Penicillins Other (See Comments)    REACTION: unspecified     CURRENT MEDICATIONS:   Current Outpatient Medications  Medication Sig Dispense Refill   amLODipine (NORVASC) 10 MG tablet TAKE 1 TABLET DAILY FOR BLOOD PRESSURE 90 tablet 3   aspirin 325 MG EC tablet Take 325 mg by mouth daily.     Blood Glucose Monitoring Suppl (ONETOUCH VERIO FLEX SYSTEM) w/Device KIT USE AS ADVISED 1 kit 0   cephALEXin (KEFLEX) 500 MG capsule Take 1 capsule (500 mg total) by mouth 3 (three) times daily for 10 days. 30 capsule 0   chlorthalidone (HYGROTON) 25 MG tablet Take 12.5 mg by mouth daily.     Cholecalciferol (VITAMIN D PO) Take 5,000 Units by mouth daily.     citalopram (CELEXA) 40 MG tablet TAKE 1 TABLET DAILY FOR MOOD & ANXIETY 90 tablet 3   FARXIGA 10 MG TABS tablet TAKE 1 TABLET BY MOUTH EVERY DAY BEFORE BREAKFAST. 90 tablet 0   glucose blood test strip Use as instructed 3x a day 200 each 12   insulin aspart  (NOVOLOG FLEXPEN) 100 UNIT/ML FlexPen 40 Units daily.     Insulin Pen Needle 29G X 5MM MISC Use with insulin pen 3 times a day. 300 each 11   Magnesium 500 MG TABS Take 500 mg by mouth daily.     meclizine (ANTIVERT) 25 MG tablet 1/2 tab up to 3 times daily for motion sickness/dizziness. 30 tablet 0   olmesartan (BENICAR) 40 MG tablet TAKE 1 TABLET DAILY FOR BLOOD PRESSURE 90 tablet 3   Omega-3 Fatty Acids (FISH OIL) 1200 MG CAPS Take 1,200 capsules by mouth daily.      omeprazole (PRILOSEC) 40 MG capsule Take 1 capsule (40 mg total) by mouth in the morning and at bedtime. 180 capsule 3   OneTouch Delica Lancets 25O MISC Use 3x a day 200 each 3   rOPINIRole (REQUIP) 0.25 MG tablet Take 1 tablet (0.25 mg total) by mouth at bedtime. 90 tablet 2   rosuvastatin (CRESTOR) 40 MG tablet TAKE 1 TABLET BY MOUTH EVERY DAY FOR CHOLESTEROL 90 tablet 1   TRESIBA FLEXTOUCH 200 UNIT/ML FlexTouch Pen INJECT 34 UNITS INTO THE SKIN DAILY. 9 mL 1   No current facility-administered medications for this visit.    REVIEW OF SYSTEMS:   _0  denotes positive finding, _1  denotes negative finding Cardiac  Comments:  Chest pain or chest pressure:    Shortness of breath upon exertion:    Short of breath when lying flat:    Irregular heart rhythm:        Vascular    Pain in calf, thigh, or hip brought on by ambulation:    Pain in feet at night that wakes you up from your sleep:     Blood clot in your veins:    Leg swelling:         Pulmonary    Oxygen at home:    Productive cough:     Wheezing:         Neurologic    Sudden weakness in arms or legs:     Sudden numbness in arms or legs:     Sudden onset of difficulty speaking or slurred speech:    Temporary loss of vision in one eye:     Problems with dizziness:  Gastrointestinal    Blood in stool:     Vomited blood:         Genitourinary    Burning when urinating:     Blood in urine:        Psychiatric    Major depression:          Hematologic    Bleeding problems:    Problems with blood clotting too easily:        Skin    Rashes or ulcers: x       Constitutional    Fever or chills:      PHYSICAL EXAM:   Vitals:   11/16/21 1418  BP: (!) 129/55  Pulse: 75  Resp: 20  Temp: 98.3 F (36.8 C)  SpO2: 93%  Weight: 184 lb (83.5 kg)  Height: _0  (1.676 m)    GENERAL: The patient is a well-nourished female, in no acute distress. The vital signs are documented above. CARDIAC: There is a regular rate and rhythm.  VASCULAR: Nonpalpable pedal pulses PULMONARY: Non-labored respirations ABDOMEN: Soft and non-tender with normal pitched bowel sounds.  MUSCULOSKELETAL: There are no major deformities or cyanosis. NEUROLOGIC: No focal weakness or paresthesias are detected. SKIN: The patient brought a picture of her ulcer as it had just been changed at Dr. Jess Barters office.  This appears to be a cratered an ulcer about 5 mm. PSYCHIATRIC: The patient has a normal affect.  STUDIES:   None  MEDICAL ISSUES:   Lower extremity atherosclerotic vascular disease with ulcer: I discussed with the patient that this is a limb threatening situation if we cannot get her ulcer to heal.  I suspect that she is having issues because of her poor circulation to the leg.  Previously she has had failed percutaneous intervention because of her small arteries.  Now I feel that she is going to need bypass for limb salvage.  First the need to get an arteriogram to determine what her vasculature looks like and plan for surgery.  I Georgina Peer do this tomorrow and add her onto the schedule.  This will be from a right femoral approach.  I have also scheduled her for bypass graft on Wednesday.  She will need preoperative vein mapping.    Leia Alf, MD, FACS [facility] [phone] Pager [phone]

## 2021-11-16 NOTE — Progress Notes (Signed)
[facility] [place]  Patient name: [name] MRN: [id number] DOB: 12/30/1945 Sex: female   REASON FOR VISIT:   Follow up  [place] ILLNESS:    [name] is a 76 y.o. female who is here today as a urgent add-on for a leg wound.  I have seen her in the past for claudication.  She underwent stenting of her left superficial femoral artery in 2012 and developed early in-stent stenosis which was treated with angioplasty.  Her stent subsequently went on to occlude.  We have been managing her claudication symptoms.  She does have a history of smoking.  She recently developed a left ankle ulcer which is progressively getting worse  I have also followed her for carotid disease for which she remains asymptomatic.  She is medically managed for hypertension with an ARB.  She is treated for diabetes and is on a statin for hypercholesterolemia.   PAST MEDICAL HISTORY:   Past Medical History:  Diagnosis Date   Abnormal findings on esophagogastroduodenoscopy (EGD) 07/2010   Aneurysm ([place]) 2013   Right Brain    Aneurysm ([place])    in brain x 2   Anxiety    Arthritis    Cataract    Chronic kidney disease    CKD (chronic kidney disease), stage IV ([place])    Colon polyps    Depression    Diabetes mellitus 1998   Diverticulosis    Emphysema of lung ([place])    ESOPHAGEAL STRICTURE 08/27/2008   GERD (gastroesophageal reflux disease)    Hemorrhoids    Hiatal hernia    Hyperlipidemia    Hypertension 2000   Migraines    Peripheral vascular disease (HCC)    Phlebitis    30 years ago  left leg   Stroke ([place])    multiple mini strokes ( brain aneurysm )     FAMILY HISTORY:   Family History  Problem Relation Age of Onset   CAD Mother 50       Died of MI   Hypertension Mother    Heart attack Mother    Heart disease Mother    CAD Father 55       Died of MI   Heart disease Father    Heart attack Father    CAD  Brother 61       Two brothers died of MI   Heart attack Brother    Heart disease Brother        Amputation   Diabetes Sister    Hypertension Sister    Heart attack Brother    Heart disease Brother    Stroke Sister    Colon cancer Neg Hx    Stomach cancer Neg Hx    Esophageal cancer Neg Hx     SOCIAL HISTORY:   Social History   Tobacco Use   Smoking status: Light Smoker    Packs/day: 0.50    Years: 40.00    Pack years: 20.00    Types: Cigarettes   Smokeless tobacco: Never   Tobacco comments:    11/18/20 in process of quiting  Substance Use Topics   Alcohol use: No    Alcohol/week: 0.0 standard drinks     ALLERGIES:   Allergies  Allergen Reactions   Ciprofloxacin Other (See Comments)   Ozempic (0.25 Or 0.5 Mg-Dose) [Semaglutide(0.25 Or 0.73m-Dos)] Nausea And Vomiting   Plavix [Clopidogrel Bisulfate] Palpitations   Amoxicillin Itching and Swelling    FACE &  EYES SWELL   Ace Inhibitors    Ciprofloxacin Hcl Swelling   Lyrica [Pregabalin]     Itch, gain weight   Penicillins Other (See Comments)    REACTION: unspecified     CURRENT MEDICATIONS:   Current Outpatient Medications  Medication Sig Dispense Refill   amLODipine (NORVASC) 10 MG tablet TAKE 1 TABLET DAILY FOR BLOOD PRESSURE 90 tablet 3   aspirin 325 MG EC tablet Take 325 mg by mouth daily.     Blood Glucose Monitoring Suppl (ONETOUCH VERIO FLEX SYSTEM) w/Device KIT USE AS ADVISED 1 kit 0   cephALEXin (KEFLEX) 500 MG capsule Take 1 capsule (500 mg total) by mouth 3 (three) times daily for 10 days. 30 capsule 0   chlorthalidone (HYGROTON) 25 MG tablet Take 12.5 mg by mouth daily.     Cholecalciferol (VITAMIN D PO) Take 5,000 Units by mouth daily.     citalopram (CELEXA) 40 MG tablet TAKE 1 TABLET DAILY FOR MOOD & ANXIETY 90 tablet 3   FARXIGA 10 MG TABS tablet TAKE 1 TABLET BY MOUTH EVERY DAY BEFORE BREAKFAST. 90 tablet 0   glucose blood test strip Use as instructed 3x a day 200 each 12   insulin aspart  (NOVOLOG FLEXPEN) 100 UNIT/ML FlexPen 40 Units daily.     Insulin Pen Needle 29G X 5MM MISC Use with insulin pen 3 times a day. 300 each 11   Magnesium 500 MG TABS Take 500 mg by mouth daily.     meclizine (ANTIVERT) 25 MG tablet 1/2 tab up to 3 times daily for motion sickness/dizziness. 30 tablet 0   olmesartan (BENICAR) 40 MG tablet TAKE 1 TABLET DAILY FOR BLOOD PRESSURE 90 tablet 3   Omega-3 Fatty Acids (FISH OIL) 1200 MG CAPS Take 1,200 capsules by mouth daily.      omeprazole (PRILOSEC) 40 MG capsule Take 1 capsule (40 mg total) by mouth in the morning and at bedtime. 180 capsule 3   OneTouch Delica Lancets 25O MISC Use 3x a day 200 each 3   rOPINIRole (REQUIP) 0.25 MG tablet Take 1 tablet (0.25 mg total) by mouth at bedtime. 90 tablet 2   rosuvastatin (CRESTOR) 40 MG tablet TAKE 1 TABLET BY MOUTH EVERY DAY FOR CHOLESTEROL 90 tablet 1   TRESIBA FLEXTOUCH 200 UNIT/ML FlexTouch Pen INJECT 34 UNITS INTO THE SKIN DAILY. 9 mL 1   No current facility-administered medications for this visit.    REVIEW OF SYSTEMS:   _0  denotes positive finding, _1  denotes negative finding Cardiac  Comments:  Chest pain or chest pressure:    Shortness of breath upon exertion:    Short of breath when lying flat:    Irregular heart rhythm:        Vascular    Pain in calf, thigh, or hip brought on by ambulation:    Pain in feet at night that wakes you up from your sleep:     Blood clot in your veins:    Leg swelling:         Pulmonary    Oxygen at home:    Productive cough:     Wheezing:         Neurologic    Sudden weakness in arms or legs:     Sudden numbness in arms or legs:     Sudden onset of difficulty speaking or slurred speech:    Temporary loss of vision in one eye:     Problems with dizziness:  Gastrointestinal    Blood in stool:     Vomited blood:         Genitourinary    Burning when urinating:     Blood in urine:        Psychiatric    Major depression:          Hematologic    Bleeding problems:    Problems with blood clotting too easily:        Skin    Rashes or ulcers: x       Constitutional    Fever or chills:      PHYSICAL EXAM:   Vitals:   11/16/21 1418  BP: (!) 129/55  Pulse: 75  Resp: 20  Temp: 98.3 F (36.8 C)  SpO2: 93%  Weight: 184 lb (83.5 kg)  Height: _0  (1.676 m)    GENERAL: The patient is a well-nourished female, in no acute distress. The vital signs are documented above. CARDIAC: There is a regular rate and rhythm.  VASCULAR: Nonpalpable pedal pulses PULMONARY: Non-labored respirations ABDOMEN: Soft and non-tender with normal pitched bowel sounds.  MUSCULOSKELETAL: There are no major deformities or cyanosis. NEUROLOGIC: No focal weakness or paresthesias are detected. SKIN: The patient brought a picture of her ulcer as it had just been changed at Dr. Jess Barters office.  This appears to be a cratered an ulcer about 5 mm. PSYCHIATRIC: The patient has a normal affect.  STUDIES:   None  MEDICAL ISSUES:   Lower extremity atherosclerotic vascular disease with ulcer: I discussed with the patient that this is a limb threatening situation if we cannot get her ulcer to heal.  I suspect that she is having issues because of her poor circulation to the leg.  Previously she has had failed percutaneous intervention because of her small arteries.  Now I feel that she is going to need bypass for limb salvage.  First the need to get an arteriogram to determine what her vasculature looks like and plan for surgery.  I Georgina Peer do this tomorrow and add her onto the schedule.  This will be from a right femoral approach.  I have also scheduled her for bypass graft on Wednesday.  She will need preoperative vein mapping.    Leia Alf, MD, FACS [facility] [phone] Pager [phone]

## 2021-11-16 NOTE — Telephone Encounter (Signed)
Called pt and lmom for pt to call back and sch for 11/30/21 for a 2 week follow up with Dr. Sharol Given

## 2021-11-16 NOTE — Telephone Encounter (Signed)
Yes Dr. Sharol Given says he wants to f/u with her in 2 weeks to see the progress. Thank you!!

## 2021-11-16 NOTE — Telephone Encounter (Signed)
Pt had an appt today with Sharol Given and was referred to [facility] specialist. Pt wanted to know if she needed to sch exactly 2 weeks from today's appt or wait until she has her appt with them. The best call back number is [phone].   Let me know what time frame and I can call and sch pt.

## 2021-11-17 ENCOUNTER — Encounter (HOSPITAL_COMMUNITY)
Admission: RE | Disposition: A | Discharge status: Home / Self Care | Payer: Self-pay | Source: Home / Self Care | Attending: Surgery

## 2021-11-17 ENCOUNTER — Ambulatory Visit (HOSPITAL_COMMUNITY)
Admission: RE | Admit: 2021-11-17 | Discharge: 2021-11-17 | Disposition: A | Discharge status: Home / Self Care | Payer: Medicare Other | Source: Home / Self Care | Attending: Surgery | Admitting: Surgery

## 2021-11-17 ENCOUNTER — Encounter: Payer: Self-pay | Admitting: Orthopedic Surgery

## 2021-11-17 ENCOUNTER — Other Ambulatory Visit: Payer: Self-pay

## 2021-11-17 ENCOUNTER — Encounter (HOSPITAL_COMMUNITY): Payer: Self-pay | Admitting: Surgery

## 2021-11-17 DIAGNOSIS — Z8719 Personal history of other diseases of the digestive system: Secondary | ICD-10-CM | POA: Diagnosis not present

## 2021-11-17 DIAGNOSIS — Z87891 Personal history of nicotine dependence: Secondary | ICD-10-CM | POA: Insufficient documentation

## 2021-11-17 DIAGNOSIS — Z7984 Long term (current) use of oral hypoglycemic drugs: Secondary | ICD-10-CM | POA: Insufficient documentation

## 2021-11-17 DIAGNOSIS — L97329 Non-pressure chronic ulcer of left ankle with unspecified severity: Secondary | ICD-10-CM | POA: Insufficient documentation

## 2021-11-17 DIAGNOSIS — I70249 Atherosclerosis of native arteries of left leg with ulceration of unspecified site: Secondary | ICD-10-CM

## 2021-11-17 DIAGNOSIS — N184 Chronic kidney disease, stage 4 (severe): Secondary | ICD-10-CM | POA: Insufficient documentation

## 2021-11-17 DIAGNOSIS — I70243 Atherosclerosis of 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: Secondary | ICD-10-CM | POA: Insufficient documentation

## 2021-11-17 DIAGNOSIS — E1151 Type 2 diabetes mellitus with diabetic peripheral angiopathy without gangrene: Secondary | ICD-10-CM | POA: Insufficient documentation

## 2021-11-17 DIAGNOSIS — I739 Peripheral vascular disease, unspecified: Secondary | ICD-10-CM | POA: Diagnosis present

## 2021-11-17 DIAGNOSIS — E11621 Type 2 diabetes mellitus with foot ulcer: Secondary | ICD-10-CM | POA: Insufficient documentation

## 2021-11-17 DIAGNOSIS — Z794 Long term (current) use of insulin: Secondary | ICD-10-CM | POA: Diagnosis not present

## 2021-11-17 DIAGNOSIS — I130 Hypertensive heart and chronic kidney disease with heart failure and stage 1 through stage 4 chronic kidney disease, or unspecified chronic kidney disease: Secondary | ICD-10-CM | POA: Diagnosis not present

## 2021-11-17 DIAGNOSIS — Z9889 Other specified postprocedural states: Secondary | ICD-10-CM | POA: Diagnosis not present

## 2021-11-17 DIAGNOSIS — F17210 Nicotine dependence, cigarettes, uncomplicated: Secondary | ICD-10-CM | POA: Diagnosis present

## 2021-11-17 DIAGNOSIS — Z8673 Personal history of transient ischemic attack (TIA), and cerebral infarction without residual deficits: Secondary | ICD-10-CM | POA: Diagnosis not present

## 2021-11-17 DIAGNOSIS — Z419 Encounter for procedure for purposes other than remedying health state, unspecified: Secondary | ICD-10-CM

## 2021-11-17 DIAGNOSIS — J449 Chronic obstructive pulmonary disease, unspecified: Secondary | ICD-10-CM | POA: Diagnosis not present

## 2021-11-17 DIAGNOSIS — E785 Hyperlipidemia, unspecified: Secondary | ICD-10-CM | POA: Diagnosis present

## 2021-11-17 DIAGNOSIS — Z8672 Personal history of thrombophlebitis: Secondary | ICD-10-CM | POA: Diagnosis not present

## 2021-11-17 DIAGNOSIS — U071 COVID-19: Secondary | ICD-10-CM | POA: Diagnosis present

## 2021-11-17 DIAGNOSIS — Z8249 Family history of ischemic heart disease and other diseases of the circulatory system: Secondary | ICD-10-CM | POA: Diagnosis not present

## 2021-11-17 DIAGNOSIS — I2510 Atherosclerotic heart disease of native coronary artery without angina pectoris: Secondary | ICD-10-CM | POA: Diagnosis not present

## 2021-11-17 DIAGNOSIS — Z833 Family history of diabetes mellitus: Secondary | ICD-10-CM | POA: Diagnosis not present

## 2021-11-17 DIAGNOSIS — I70222 Atherosclerosis of native arteries of extremities with rest pain, left leg: Secondary | ICD-10-CM | POA: Diagnosis not present

## 2021-11-17 DIAGNOSIS — Z881 Allergy status to other antibiotic agents status: Secondary | ICD-10-CM | POA: Diagnosis not present

## 2021-11-17 DIAGNOSIS — Z7982 Long term (current) use of aspirin: Secondary | ICD-10-CM | POA: Diagnosis not present

## 2021-11-17 DIAGNOSIS — I129 Hypertensive chronic kidney disease with stage 1 through stage 4 chronic kidney disease, or unspecified chronic kidney disease: Secondary | ICD-10-CM | POA: Insufficient documentation

## 2021-11-17 DIAGNOSIS — Z79899 Other long term (current) drug therapy: Secondary | ICD-10-CM | POA: Diagnosis not present

## 2021-11-17 DIAGNOSIS — E1122 Type 2 diabetes mellitus with diabetic chronic kidney disease: Secondary | ICD-10-CM | POA: Diagnosis present

## 2021-11-17 DIAGNOSIS — F32A Depression, unspecified: Secondary | ICD-10-CM | POA: Diagnosis present

## 2021-11-17 DIAGNOSIS — L97929 Non-pressure chronic ulcer of unspecified part of left lower leg with unspecified severity: Secondary | ICD-10-CM | POA: Diagnosis present

## 2021-11-17 DIAGNOSIS — E7800 Pure hypercholesterolemia, unspecified: Secondary | ICD-10-CM | POA: Insufficient documentation

## 2021-11-17 DIAGNOSIS — I70202 Unspecified atherosclerosis of native arteries of extremities, left leg: Secondary | ICD-10-CM | POA: Diagnosis present

## 2021-11-17 DIAGNOSIS — Z888 Allergy status to other drugs, medicaments and biological substances status: Secondary | ICD-10-CM | POA: Diagnosis not present

## 2021-11-17 DIAGNOSIS — Z823 Family history of stroke: Secondary | ICD-10-CM | POA: Diagnosis not present

## 2021-11-17 DIAGNOSIS — J439 Emphysema, unspecified: Secondary | ICD-10-CM | POA: Diagnosis present

## 2021-11-17 DIAGNOSIS — Z880 Allergy status to penicillin: Secondary | ICD-10-CM | POA: Diagnosis not present

## 2021-11-17 HISTORY — PX: ABDOMINAL AORTOGRAM W/LOWER EXTREMITY: CATH118223

## 2021-11-17 LAB — GLUCOSE, CAPILLARY: Glucose-Capillary: 114 mg/dL — ABNORMAL HIGH (ref 70–99)

## 2021-11-17 LAB — ABO/RH: ABO/RH(D): O POS

## 2021-11-17 LAB — URINALYSIS, ROUTINE W REFLEX MICROSCOPIC
Bilirubin Urine: NEGATIVE
Glucose, UA: >=500 mg/dL — AB
Hgb urine dipstick: NEGATIVE
Ketones, ur: NEGATIVE mg/dL
Leukocytes,Ua: NEGATIVE
Nitrite: NEGATIVE
Protein, ur: NEGATIVE mg/dL
Specific Gravity, Urine: 1.026 (ref 1.005–1.030)
pH: 5 (ref 5.0–8.0)

## 2021-11-17 LAB — SURGICAL PCR SCREEN
MRSA, PCR: NEGATIVE
Staphylococcus aureus: NEGATIVE

## 2021-11-17 LAB — APTT: aPTT: 28 seconds (ref 24–36)

## 2021-11-17 LAB — CBC
HCT: 45.4 % (ref 36.0–46.0)
Hemoglobin: 15 g/dL (ref 12.0–15.0)
MCH: 33.4 pg (ref 26.0–34.0)
MCHC: 33 g/dL (ref 30.0–36.0)
MCV: 101.1 fL — ABNORMAL HIGH (ref 80.0–100.0)
Platelets: 336 10*3/uL (ref 150–400)
RBC: 4.49 MIL/uL (ref 3.87–5.11)
RDW: 12 % (ref 11.5–15.5)
WBC: 10.2 10*3/uL (ref 4.0–10.5)
nRBC: 0 % (ref 0.0–0.2)

## 2021-11-17 LAB — PROTIME-INR
INR: 0.9 (ref 0.8–1.2)
Prothrombin Time: 11.9 seconds (ref 11.4–15.2)

## 2021-11-17 LAB — HEMOGLOBIN A1C
Hgb A1c MFr Bld: 7.1 % — ABNORMAL HIGH (ref 4.8–5.6)
Mean Plasma Glucose: 157.07 mg/dL

## 2021-11-17 LAB — TYPE AND SCREEN
ABO/RH(D): O POS
Antibody Screen: NEGATIVE

## 2021-11-17 SURGERY — ABDOMINAL AORTOGRAM W/LOWER EXTREMITY
Anesthesia: LOCAL | Laterality: Left

## 2021-11-17 MED ORDER — SODIUM CHLORIDE 0.9% FLUSH: 3.0000 mL | INTRAVENOUS | Status: DC | PRN

## 2021-11-17 MED ORDER — LABETALOL HCL 5 MG/ML IV SOLN: 10.0000 mg | INTRAVENOUS | Status: DC | PRN

## 2021-11-17 MED ORDER — MIDAZOLAM HCL 2 MG/2ML IJ SOLN
INTRAMUSCULAR | Status: DC | PRN
  Administered 2021-11-17 (×2): 1 mg via INTRAVENOUS

## 2021-11-17 MED ORDER — LIDOCAINE HCL (PF) 1 % IJ SOLN
INTRAMUSCULAR | Status: DC | PRN
  Administered 2021-11-17: 12 mL

## 2021-11-17 MED ORDER — FENTANYL CITRATE (PF) 100 MCG/2ML IJ SOLN
INTRAMUSCULAR | Status: DC | PRN
  Administered 2021-11-17: 50 ug via INTRAVENOUS

## 2021-11-17 MED ORDER — OXYCODONE HCL 5 MG PO TABS: 5.0000 mg | ORAL_TABLET | ORAL | Status: DC | PRN

## 2021-11-17 MED ORDER — ONDANSETRON HCL 4 MG/2ML IJ SOLN: 4.0000 mg | Freq: Four times a day (QID) | INTRAMUSCULAR | Status: DC | PRN

## 2021-11-17 MED ORDER — MIDAZOLAM HCL 2 MG/2ML IJ SOLN
INTRAMUSCULAR | Status: AC
  Filled 2021-11-17: qty 2

## 2021-11-17 MED ORDER — HEPARIN (PORCINE) IN NACL 1000-0.9 UT/500ML-% IV SOLN
INTRAVENOUS | Status: AC
  Filled 2021-11-17: qty 1000

## 2021-11-17 MED ORDER — HYDRALAZINE HCL 20 MG/ML IJ SOLN: 5.0000 mg | INTRAMUSCULAR | Status: DC | PRN

## 2021-11-17 MED ORDER — FENTANYL CITRATE (PF) 100 MCG/2ML IJ SOLN
INTRAMUSCULAR | Status: AC
  Filled 2021-11-17: qty 2

## 2021-11-17 MED ORDER — SODIUM CHLORIDE 0.9 % IV SOLN: 250.0000 mL | INTRAVENOUS | Status: DC | PRN

## 2021-11-17 MED ORDER — SODIUM CHLORIDE 0.9 % WEIGHT BASED INFUSION: 1.0000 mL/kg/h | INTRAVENOUS | Status: DC

## 2021-11-17 MED ORDER — SODIUM CHLORIDE 0.9 % IV SOLN: INTRAVENOUS | Status: DC

## 2021-11-17 MED ORDER — MORPHINE SULFATE (PF) 2 MG/ML IV SOLN: 2.0000 mg | INTRAVENOUS | Status: DC | PRN

## 2021-11-17 MED ORDER — LIDOCAINE HCL (PF) 1 % IJ SOLN
INTRAMUSCULAR | Status: AC
  Filled 2021-11-17: qty 30

## 2021-11-17 MED ORDER — SODIUM CHLORIDE 0.9% FLUSH: 3.0000 mL | Freq: Two times a day (BID) | INTRAVENOUS | Status: DC

## 2021-11-17 MED ORDER — IODIXANOL 320 MG/ML IV SOLN
INTRAVENOUS | Status: DC | PRN
  Administered 2021-11-17: 47 mL

## 2021-11-17 MED ORDER — ACETAMINOPHEN 325 MG PO TABS: 650.0000 mg | ORAL_TABLET | ORAL | Status: DC | PRN

## 2021-11-17 MED ORDER — HEPARIN (PORCINE) IN NACL 1000-0.9 UT/500ML-% IV SOLN
INTRAVENOUS | Status: DC | PRN
  Administered 2021-11-17 (×2): 500 mL

## 2021-11-17 SURGICAL SUPPLY — 9 items
CATH OMNI FLUSH 5F 65CM (CATHETERS) ×1 IMPLANT
DEVICE VASC CLSR CELT ART 5 (Vascular Products) ×1 IMPLANT
KIT MICROPUNCTURE NIT STIFF (SHEATH) ×1 IMPLANT
KIT PV (KITS) ×3 IMPLANT
SHEATH PINNACLE 5F 10CM (SHEATH) ×1 IMPLANT
SYR MEDRAD MARK V 150ML (SYRINGE) ×1 IMPLANT
TRANSDUCER W/STOPCOCK (MISCELLANEOUS) ×3 IMPLANT
TRAY PV CATH (CUSTOM PROCEDURE TRAY) ×3 IMPLANT
WIRE BENTSON .035X145CM (WIRE) ×1 IMPLANT

## 2021-11-17 NOTE — Pre-Procedure Instructions (Addendum)
[name]  11/17/2021     Your procedure is scheduled on Wednesday, March 1.  Report to [facility] Admitting at 10:10 AM Your surgery or procedure is scheduled to begin at  12:10 pm  Call this number if you have problems the morning of surgery:  [phone]- this is the pre surgery desk. Remember:  Do not eat or drink after midnight.   Tonight take 1/2 of Tresiba Dose- 20 units  Take these medicines the morning of surgery with A SIP OF WATER:  amLODipine (NORVASC)  aspirin 325   STOP taking  Aspirin Products (Goody Powder, Excedrin Migraine), Ibuprofen (Advil), Naproxen (Aleve), Vitamins and Herbal Products (ie Fish Oil).   WHAT DO I DO ABOUT MY DIABETES MEDICATION?  Do not take oral diabetes medicines (pills) the morning of surgery.  THE NIGHT BEFORE SURGERY, take 20 units of TRESIBA insulin.       THE MORNING OF SURGERY, take_20_units of TRESIBA insulin >> if CBG if CBG is greater than 70.  The day of surgery do not take FARXIGA;   If your CBG is greater than 220 mg/dL, you may take  of your sliding scale (correction) dose of insulin.  Why is it important to control my blood sugar before and after surgery? Improving blood sugar levels before and after surgery helps healing and can limit problems. A way of improving blood sugar control is eating a healthy diet by:  Eating less sugar and carbohydrates  Increasing activity/exercise  Talking with your doctor about reaching your blood sugar goals High blood sugars (greater than 180 mg/dL) can raise your risk of infections and slow your recovery, so you will need to focus on controlling your diabetes during the weeks before surgery. Make sure that the doctor who takes care of your diabetes knows about your planned surgery including the date and location.  How do I manage my blood sugar before surgery? Check your blood sugar at least 4 times a day, starting 2 days before surgery, to make sure that the level  is not too high or low. Check your blood sugar the morning of your surgery when you wake up and every 2 hours until you get to the Short Stay unit. If your blood sugar is less than 70 mg/dL, you will need to treat for low blood sugar: Do not take insulin. Treat a low blood sugar (less than 70 mg/dL) with  cup of clear juice (cranberry or apple), 4 glucose tablets, OR glucose gel. Recheck blood sugar in 15 minutes after treatment (to make sure it is greater than 70 mg/dL). If your blood sugar is not greater than 70 mg/dL on recheck, call [phone]  for further instructions. Report your blood sugar to the short stay nurse when you get to Short Stay.  If you are admitted to the hospital after surgery: Your blood sugar will be checked by the staff and you will probably be given insulin after surgery (instead of oral diabetes medicines) to make sure you have good blood sugar levels. The goal for blood sugar control after surgery is 80-180 mg/dL.   [facility]- Preparing For Surgery  Before surgery, you can play an important role. Because skin is not sterile, your skin needs to be as free of germs as possible. You can reduce the number of germs on your skin by washing with CHG (chlorahexidine gluconate) Soap before surgery.  CHG is an antiseptic cleaner which kills germs and bonds with the skin  to continue killing germs even after washing.    Oral Hygiene is also important to reduce your risk of infection.  Remember - BRUSH YOUR TEETH THE MORNING OF SURGERY WITH YOUR REGULAR TOOTHPASTE  Please do not use if you have an allergy to CHG or antibacterial soaps. If your skin becomes reddened/irritated stop using the CHG.  Do not shave (including legs and underarms) for at least 48 hours prior to first CHG shower. It is OK to shave your face.  Please follow these instructions carefully.   Shower the NIGHT BEFORE SURGERY and the MORNING OF SURGERY with CHG.   If you chose to wash your hair, wash  your hair first as usual with your normal shampoo.  After you shampoo, wash your face and private area with the soap you use at home, then rinse your hair and body thoroughly to remove the shampoo and soap.  Use CHG as you would any other liquid soap. You can apply CHG directly to the skin and wash gently with a scrungie or a clean washcloth.   Apply the CHG Soap to your body ONLY FROM THE NECK DOWN.  Do not use on open wounds or open sores. Avoid contact with your eyes, ears, mouth and genitals (private parts).   Wash thoroughly, paying special attention to the area where your surgery will be performed.  Thoroughly rinse your body with warm water from the neck down.  DO NOT shower/wash with your normal soap after using and rinsing off the CHG Soap.  Pat yourself dry with a CLEAN TOWEL.  Wear CLEAN PAJAMAS to bed the night before surgery, wear comfortable clothes the morning of surgery  Place CLEAN SHEETS on your bed the night of your first shower and DO NOT SLEEP WITH PETS.  Day of Surgery: Shower as instructed above. Do not apply any deodorants/lotions, powders or colognes.  Please wear clean clothes to the hospital/surgery center.   Remember to brush your teeth WITH YOUR REGULAR TOOTHPASTE.  Do not wear jewelry, make-up or nail polish.  Do not shave 48 hours prior to surgery.  Men may shave face and neck.  Do not bring valuables to the hospital.  [facility] is not responsible for any belongings or valuables.  Contacts, dentures or bridgework may not be worn into surgery.  Leave your suitcase in the car.  After surgery it may be brought to your room.  For patients admitted to the hospital, discharge time will be determined by your treatment team.  Visitors: Only 1 person is allowed in the waiting area when patient is is Pre- OP Area through Recovery Room. No Visitors are allowed in the Pre op area. When patient is admitted to a room, once you are in your room,  2 people may visit  at a time - they may switch off. 1 visitor may spend the night in the patient room, you must arrive in  the room by 8:00 pm.  Please read over the fact sheets that you were given: Pain Management, Coughing and Deep Breathing, Surgical Site Infections, After Your Covid Test.     \

## 2021-11-17 NOTE — Progress Notes (Addendum)
PCP - Dr. Dr. Magnus Sinning  Cardiologist - Dr. Ardis Rowan  Nephrologist- [facility]  EP-no  Endocrine-no  Pulm-no  Chest x-ray - na  EKG - 11/10/21  Stress Test - 2020  ECHO - 2018  Cardiac Cath - no  AICD-no PM-no LOOP-no  Nerve Stimulator-no  Dialysis-no  Sleep Study - no CPAP - no  LABS-CBC   ERAS-no  HA1C- 11/17/21 7.5 Fasting Blood Sugar - 120 Checks Blood Sugar ___1__ times a day  Anesthesia-  Pt denies having chest pain, sob, or fever at this time. All instructions explained to the pt, with a verbal understanding of the material. Pt agrees to go over the instructions while at home for a better understanding. Pt also instructed to self quarantine after being tested for COVID-19. The opportunity to ask questions was provided.

## 2021-11-17 NOTE — Discharge Instructions (Signed)
Femoral Site Care This sheet gives you information about how to care for yourself after your procedure. Your health care provider may also give you more specific instructions. If you have problems or questions, contact your health care provider. What can I expect after the procedure? After the procedure, it is common to have: Bruising that usually fades within 1-2 weeks. Tenderness at the site. Follow these instructions at home: Wound care May remove bandage after 24 hours. Do not take baths, swim, or use a hot tub for 5 days. You may shower 24-48 hours after the procedure. Gently wash the site with plain soap and water. Pat the area dry with a clean towel. Do not rub the site. This may cause bleeding. Do not apply powder or lotion to the site. Keep the site clean and dry. Check your femoral site every day for signs of infection. Check for: Redness, swelling, or pain. Fluid or blood. Warmth. Pus or a bad smell. Activity For the first 2-3 days after your procedure, or as long as directed: Avoid climbing stairs as much as possible. Do not squat. Do not lift, push or pull anything that is heavier than 10 lb for 5 days. Rest as directed. Avoid sitting for a long time without moving. Get up to take short walks every 1-2 hours. Do not drive for 24 hours. General instructions Take over-the-counter and prescription medicines only as told by your health care provider. Keep all follow-up visits as told by your health care provider. This is important. DRINK PLENTY OF FLUIDS FOR THE NEXT 2-3 DAYS. Contact a health care provider if you have: A fever or chills. You have redness, swelling, or pain around your insertion site. Get help right away if: The catheter insertion area swells very fast. You pass out. You suddenly start to sweat or your skin gets clammy. The catheter insertion area is bleeding, and the bleeding does not stop when you hold steady pressure on the area. The area near or  just beyond the catheter insertion site becomes pale, cool, tingly, or numb. These symptoms may represent a serious problem that is an emergency. Do not wait to see if the symptoms will go away. Get medical help right away. Call your local emergency services (911 in the [place] Do not drive yourself to the hospital. Summary After the procedure, it is common to have bruising that usually fades within 1-2 weeks. Check your femoral site every day for signs of infection. Do not lift, push or pull anything that is heavier than 10 lb for 5 days.  This information is not intended to replace advice given to you by your health care provider. Make sure you discuss any questions you have with your health care provider. Document Revised: 09/19/2017 Document Reviewed: 09/19/2017 Elsevier Patient Education  2020 Elsevier Inc.  

## 2021-11-17 NOTE — Interval H&P Note (Signed)
History and Physical Interval Note:  11/17/2021 2:42 PM  [name]  has presented today for surgery, with the diagnosis of PAD.  The various methods of treatment have been discussed with the patient and family. After consideration of risks, benefits and other options for treatment, the patient has consented to  Procedure(s): ABDOMINAL AORTOGRAM W/LOWER EXTREMITY (N/A) as a surgical intervention.  The patient's history has been reviewed, patient examined, no change in status, stable for surgery.  I have reviewed the patient's chart and labs.  Questions were answered to the patient's satisfaction.     [name]

## 2021-11-17 NOTE — Progress Notes (Signed)
Potassium was homologized in CMP drawn today. I was told that Procedural area, where the patient was were asked to recollect, lab has not received redraw.  CMP will have to recollected in am.

## 2021-11-17 NOTE — Progress Notes (Signed)
Office Visit Note   Patient: [name]           Date of Birth: 1946-06-13           MRN: [id number] Visit Date: 11/16/2021              Requested by: [name], [address] [place],  [place] [id number] PCP: [name], MD  Chief Complaint  Patient presents with   Left Foot - Wound Check      HPI: Patient is a 76 year old woman who is seen for a full.  Patient states she is currently on Keflex.  Ankle-brachial indices on January 17 ischemic ulcer left leg.  Patient states she has been going to a wound center on [address].  Currently using an Ace with silver cell dressing.  Patient states that wrapping her leg is painful.  Most recent ankle-brachial indices on January 2022 showed severe peripheral vascular disease.  Assessment & Plan: Visit Diagnoses:  1. Arterial insufficiency with ischemic ulcer ([place])     Plan: Will call vascular vein surgery and try to get her into their office as soon as possible.  I will follow-up in the office in 2 weeks to monitor her progress.  Follow-Up Instructions: Return in about 2 weeks (around 11/30/2021).   Ortho Exam  Patient is alert, oriented, no adenopathy, well-dressed, normal affect, normal respiratory effort. Examination patient has a painful ischemic ulcer over the left leg there is approximately 2 cm in diameter 1 cm deep.  Patient's most recent ankle-brachial indices showed monophasic flow in the right 0.45 and biphasic flow on the left at 0.46.  Imaging: No results found. No images are attached to the encounter.  Labs: Lab Results  Component Value Date   HGBA1C 8.5 (H) 08/04/2021   HGBA1C 8.7 (A) 06/08/2021   HGBA1C 8.4 (H) 12/15/2020   ESRSEDRATE 11 06/06/2018   ESRSEDRATE 12 05/01/2009   LABURIC 4.6 04/18/2018   REPTSTATUS 07/25/2017 FINAL 07/23/2017   CULT >=100,000 COLONIES/mL ESCHERICHIA COLI (A) 07/23/2017   LABORGA ESCHERICHIA COLI (A) 07/23/2017     Lab Results  Component  Value Date   ALBUMIN 3.5 10/28/2020   ALBUMIN 3.0 (L) 01/27/2017   ALBUMIN 3.2 (L) 01/25/2017    Lab Results  Component Value Date   MG 2.2 08/04/2021   MG 2.5 08/04/2020   MG 1.9 11/13/2018   Lab Results  Component Value Date   VD25OH 21 (L) 08/04/2020   VD25OH 27 (L) 04/18/2018   VD25OH 22 (L) 07/22/2016    No results found for: PREALBUMIN CBC EXTENDED Latest Ref Rng & Units 11/10/2021 08/04/2021 12/15/2020  WBC 3.8 - 10.8 Thousand/uL 9.0 9.2 11.2(H)  RBC 3.80 - 5.10 Million/uL 4.53 4.59 4.83  HGB 11.7 - 15.5 g/dL 15.1 15.2 15.4  HCT 35.0 - 45.0 % 45.2(H) 45.4(H) 48.1(H)  PLT 140 - 400 Thousand/uL 260 247 236  NEUTROABS 1,500 - 7,800 cells/uL 4,995 5,584 8,086(H)  LYMPHSABS 850 - 3,900 cells/uL 3,006 2,732 2,363     There is no height or weight on file to calculate BMI.  Orders:  Orders Placed This Encounter  Procedures   Ambulatory referral to Vascular Surgery   No orders of the defined types were placed in this encounter.    Procedures: No procedures performed  Clinical Data: No additional findings.  ROS:  All other systems negative, except as noted in the HPI. Review of Systems  Objective: Vital Signs: There were no vitals taken for  this visit.  Specialty Comments:  No specialty comments available.  PMFS History: Patient Active Problem List   Diagnosis Date Noted   Infected traumatic leg ulcer, left, with fat layer exposed ([place]) 11/11/2021   Infected wound 11/10/2021   Pain in right knee 01/07/2020   Carotid artery stenosis 12/03/2019   Migraine with aura and without status migrainosus, not intractable 11/28/2019   Hyperlipidemia associated with type 2 diabetes mellitus ([place]) 11/10/2019   Coronary artery calcification 08/05/2019   PAOD (peripheral arterial occlusive disease) ([place]) 02/26/2019   SOB (shortness of breath) 11/17/2018   Cardiomegaly 11/17/2018   Emphysema of lung ([place]) 10/10/2017   Atherosclerosis of aorta ([place]) 10/10/2017   Lung  nodule 10/10/2017   Anxiety 08/22/2017   Arthritis 08/22/2017   Neuropathy due to secondary diabetes ([place]) 08/22/2017   PVD (peripheral vascular disease) ([place]) 04/13/2017   Type 2 diabetes mellitus with circulatory disorder, with long-term current use of insulin ([place]) 11/07/2015   Encounter for Medicare annual wellness exam 06/24/2015   Generalized anxiety disorder 03/26/2015   Depression, major, recurrent, in partial remission ([place]) 03/26/2015   History of CVA (cerebrovascular accident) 10/04/2014   Tobacco abuse 10/04/2014   Vitamin D deficiency 11/02/2013   Medication management 11/02/2013   Peripheral vascular disease due to secondary diabetes mellitus ([place]) 05/08/2012   Atherosclerosis of native arteries of extremity with intermittent claudication 12/24/2011   Diverticulosis of large intestine 06/09/2010   History of colonic polyps 06/09/2010   Esophageal reflux 08/27/2008   Nontoxic multinodular goiter 03/11/2008   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[place]) 03/11/2008   Brain aneurysm 03/11/2008   CKD stage 4 due to type 2 diabetes mellitus ([place]) 05/01/2007   Essential hypertension 05/01/2007   Past Medical History:  Diagnosis Date   Abnormal findings on esophagogastroduodenoscopy (EGD) 07/2010   Aneurysm ([place]) 2013   Right Brain    Aneurysm ([place])    in brain x 2   Anxiety    Arthritis    Cataract    Chronic kidney disease    CKD (chronic kidney disease), stage IV ([place])    Colon polyps    Depression    Diabetes mellitus 1998   Diverticulosis    Emphysema of lung ([place])    ESOPHAGEAL STRICTURE 08/27/2008   GERD (gastroesophageal reflux disease)    Hemorrhoids    Hiatal hernia    Hyperlipidemia    Hypertension 2000   Migraines    Peripheral vascular disease ([place])    Phlebitis    30 years ago  left leg   Stroke ([place])    multiple mini strokes ( brain aneurysm )    Family History  Problem Relation Age of Onset   CAD Mother 26       Died of MI   Hypertension Mother     Heart attack Mother    Heart disease Mother    CAD Father 24       Died of MI   Heart disease Father    Heart attack Father    CAD Brother 47       Two brothers died of MI   Heart attack Brother    Heart disease Brother        Amputation   Diabetes Sister    Hypertension Sister    Heart attack Brother    Heart disease Brother    Stroke Sister    Colon cancer Neg Hx    Stomach cancer Neg Hx    Esophageal cancer Neg Hx  Past Surgical History:  Procedure Laterality Date   ABDOMINAL AORTAGRAM N/A 01/04/2012   Procedure: ABDOMINAL AORTAGRAM;  Surgeon: Serafina Mitchell, MD;  Location: [facility] CATH LAB;  Service: Cardiovascular;  Laterality: N/A;   ABDOMINAL AORTAGRAM N/A 08/15/2012   Procedure: ABDOMINAL Maxcine Ham;  Surgeon: Serafina Mitchell, MD;  Location: [facility] CATH LAB;  Service: Cardiovascular;  Laterality: N/A;   ABDOMINAL HYSTERECTOMY  1984   cataract surgery Right 10-22-2015   cataract surgery Left 11-05-2015   CESAREAN SECTION     CHOLECYSTECTOMY     COLONOSCOPY  07/2010   DENTAL SURGERY  Aug. 16, 2013   left lower    ESOPHAGOGASTRODUODENOSCOPY  07/2010   EYE SURGERY  Nov. 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

## 2021-11-17 NOTE — Op Note (Signed)
° ° °  Patient name: [name] MRN: [id number] DOB: [birth year] Sex: female  11/17/2021 Pre-operative Diagnosis: Left leg ulcer Post-operative diagnosis:  Same Surgeon:  [name] Procedure Performed:  1.  Ultrasound-guided access, right femoral artery  2.  Abdominal aortogram  3.  Left lower extremity runoff  4.  Second-order catheterization  5.  Conscious sedation, 21 minutes  6.  Closure device, Celt   Indications: This is a 76 year old female who has a history of prior lower extremity interventions which had been occluded for a while.  She has been managing her claudication symptoms, however she recently developed a lower extremity ulcer.  She comes in today for angiography.  She does have chronic renal insufficiency.  Procedure:  The patient was identified in the holding area and taken to room 8.  The patient was then placed supine on the table and prepped and draped in the usual sterile fashion.  A time out was called.  Conscious sedation was administered with the use of IV fentanyl and Versed under continuous physician and nurse monitoring.  Heart rate, blood pressure, and oxygen saturation were continuously monitored.  Total sedation time was 21 minutes.  Ultrasound was used to evaluate the right common femoral artery.  It was patent .  A digital ultrasound image was acquired.  A micropuncture needle was used to access the right common femoral artery under ultrasound guidance.  An 018 wire was advanced without resistance and a micropuncture sheath was placed.  The 018 wire was removed and a benson wire was placed.  The micropuncture sheath was exchanged for a 5 french sheath.  An omniflush catheter was advanced over the wire to the level of L-1.  An abdominal angiogram was obtained.  Next, using the omniflush catheter and a benson wire, the aortic bifurcation was crossed and the catheter was placed into theleft external iliac artery and left runoff was obtained.   Findings:    Aortogram: No significant renal artery stenosis is identified.  The infrarenal abdominal aorta is widely patent.  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  Bilateral common and external iliac arteries are patent without significant stenosis.  Right Lower Extremity: Not evaluated given renal insufficiency  Left Lower Extremity: Left common femoral profundofemoral artery are widely patent.  Superficial femoral artery and its associated stents are occluded.  There is reconstitution of the above-knee popliteal artery.  There is three-vessel runoff to the ankle with diffuse disease out onto the foot  Intervention: After the diagnostic images were obtained, the groin was closed with a Celt.  Impression:  #1  Left superficial femoral artery occlusion with reconstitution of the above-knee popliteal artery  #2  Patient will be scheduled for left femoral to above-knee popliteal bypass graft tomorrow.  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  The superior mesenteric artery was not imaged and so this is of unknown significance   V. [name], M.D., [facility] [place] Office: [phone] Pager:  [phone]

## 2021-11-18 ENCOUNTER — Other Ambulatory Visit: Payer: Self-pay

## 2021-11-18 ENCOUNTER — Inpatient Hospital Stay (HOSPITAL_COMMUNITY)
Admission: RE | Admit: 2021-11-18 | Discharge: 2021-11-21 | DRG: 252 | Disposition: A | Discharge status: Home / Self Care | Payer: Medicare Other | Attending: Surgery | Admitting: Surgery

## 2021-11-18 ENCOUNTER — Inpatient Hospital Stay (HOSPITAL_COMMUNITY): Payer: Medicare Other | Admitting: Certified Registered Nurse Anesthetist

## 2021-11-18 ENCOUNTER — Inpatient Hospital Stay (HOSPITAL_COMMUNITY): Payer: Medicare Other

## 2021-11-18 ENCOUNTER — Encounter (HOSPITAL_COMMUNITY): Payer: Self-pay | Admitting: Surgery

## 2021-11-18 ENCOUNTER — Encounter (HOSPITAL_COMMUNITY)
Admission: RE | Disposition: A | Discharge status: Home / Self Care | Payer: Self-pay | Source: Home / Self Care | Attending: Surgery

## 2021-11-18 DIAGNOSIS — J439 Emphysema, unspecified: Secondary | ICD-10-CM | POA: Diagnosis present

## 2021-11-18 DIAGNOSIS — E1122 Type 2 diabetes mellitus with diabetic chronic kidney disease: Secondary | ICD-10-CM | POA: Diagnosis present

## 2021-11-18 DIAGNOSIS — I70202 Unspecified atherosclerosis of native arteries of extremities, left leg: Secondary | ICD-10-CM | POA: Diagnosis present

## 2021-11-18 DIAGNOSIS — F419 Anxiety disorder, unspecified: Secondary | ICD-10-CM | POA: Diagnosis present

## 2021-11-18 DIAGNOSIS — Z794 Long term (current) use of insulin: Secondary | ICD-10-CM | POA: Diagnosis not present

## 2021-11-18 DIAGNOSIS — Z79899 Other long term (current) drug therapy: Secondary | ICD-10-CM | POA: Diagnosis not present

## 2021-11-18 DIAGNOSIS — Z880 Allergy status to penicillin: Secondary | ICD-10-CM | POA: Diagnosis not present

## 2021-11-18 DIAGNOSIS — Z01818 Encounter for other preprocedural examination: Secondary | ICD-10-CM

## 2021-11-18 DIAGNOSIS — F32A Depression, unspecified: Secondary | ICD-10-CM | POA: Diagnosis present

## 2021-11-18 DIAGNOSIS — I70249 Atherosclerosis of native arteries of left leg with ulceration of unspecified site: Secondary | ICD-10-CM | POA: Diagnosis not present

## 2021-11-18 DIAGNOSIS — I130 Hypertensive heart and chronic kidney disease with heart failure and stage 1 through stage 4 chronic kidney disease, or unspecified chronic kidney disease: Secondary | ICD-10-CM | POA: Diagnosis not present

## 2021-11-18 DIAGNOSIS — E785 Hyperlipidemia, unspecified: Secondary | ICD-10-CM | POA: Diagnosis present

## 2021-11-18 DIAGNOSIS — Z9889 Other specified postprocedural states: Secondary | ICD-10-CM | POA: Diagnosis not present

## 2021-11-18 DIAGNOSIS — L97929 Non-pressure chronic ulcer of unspecified part of left lower leg with unspecified severity: Secondary | ICD-10-CM | POA: Diagnosis present

## 2021-11-18 DIAGNOSIS — Z8249 Family history of ischemic heart disease and other diseases of the circulatory system: Secondary | ICD-10-CM | POA: Diagnosis not present

## 2021-11-18 DIAGNOSIS — J449 Chronic obstructive pulmonary disease, unspecified: Secondary | ICD-10-CM | POA: Diagnosis not present

## 2021-11-18 DIAGNOSIS — Z8719 Personal history of other diseases of the digestive system: Secondary | ICD-10-CM | POA: Diagnosis not present

## 2021-11-18 DIAGNOSIS — Z8673 Personal history of transient ischemic attack (TIA), and cerebral infarction without residual deficits: Secondary | ICD-10-CM | POA: Diagnosis not present

## 2021-11-18 DIAGNOSIS — Z881 Allergy status to other antibiotic agents status: Secondary | ICD-10-CM

## 2021-11-18 DIAGNOSIS — F17210 Nicotine dependence, cigarettes, uncomplicated: Secondary | ICD-10-CM | POA: Diagnosis present

## 2021-11-18 DIAGNOSIS — E1151 Type 2 diabetes mellitus with diabetic peripheral angiopathy without gangrene: Secondary | ICD-10-CM | POA: Diagnosis present

## 2021-11-18 DIAGNOSIS — I70222 Atherosclerosis of native arteries of extremities with rest pain, left leg: Secondary | ICD-10-CM | POA: Diagnosis not present

## 2021-11-18 DIAGNOSIS — Z8672 Personal history of thrombophlebitis: Secondary | ICD-10-CM

## 2021-11-18 DIAGNOSIS — Z833 Family history of diabetes mellitus: Secondary | ICD-10-CM

## 2021-11-18 DIAGNOSIS — I129 Hypertensive chronic kidney disease with stage 1 through stage 4 chronic kidney disease, or unspecified chronic kidney disease: Secondary | ICD-10-CM | POA: Diagnosis present

## 2021-11-18 DIAGNOSIS — K219 Gastro-esophageal reflux disease without esophagitis: Secondary | ICD-10-CM | POA: Diagnosis present

## 2021-11-18 DIAGNOSIS — I2510 Atherosclerotic heart disease of native coronary artery without angina pectoris: Secondary | ICD-10-CM | POA: Diagnosis not present

## 2021-11-18 DIAGNOSIS — U071 COVID-19: Secondary | ICD-10-CM | POA: Diagnosis present

## 2021-11-18 DIAGNOSIS — G43909 Migraine, unspecified, not intractable, without status migrainosus: Secondary | ICD-10-CM | POA: Diagnosis present

## 2021-11-18 DIAGNOSIS — I739 Peripheral vascular disease, unspecified: Secondary | ICD-10-CM | POA: Diagnosis present

## 2021-11-18 DIAGNOSIS — Z888 Allergy status to other drugs, medicaments and biological substances status: Secondary | ICD-10-CM | POA: Diagnosis not present

## 2021-11-18 DIAGNOSIS — L97329 Non-pressure chronic ulcer of left ankle with unspecified severity: Secondary | ICD-10-CM | POA: Diagnosis present

## 2021-11-18 DIAGNOSIS — Z7982 Long term (current) use of aspirin: Secondary | ICD-10-CM

## 2021-11-18 DIAGNOSIS — Z419 Encounter for procedure for purposes other than remedying health state, unspecified: Secondary | ICD-10-CM

## 2021-11-18 DIAGNOSIS — Z823 Family history of stroke: Secondary | ICD-10-CM | POA: Diagnosis not present

## 2021-11-18 DIAGNOSIS — N184 Chronic kidney disease, stage 4 (severe): Secondary | ICD-10-CM | POA: Diagnosis present

## 2021-11-18 DIAGNOSIS — K5790 Diverticulosis of intestine, part unspecified, without perforation or abscess without bleeding: Secondary | ICD-10-CM | POA: Diagnosis present

## 2021-11-18 DIAGNOSIS — E1351 Other specified diabetes mellitus with diabetic peripheral angiopathy without gangrene: Secondary | ICD-10-CM

## 2021-11-18 HISTORY — DX: Polyneuropathy, unspecified: G62.9

## 2021-11-18 HISTORY — DX: Family history of other specified conditions: Z84.89

## 2021-11-18 HISTORY — PX: FEMORAL-POPLITEAL BYPASS GRAFT: SHX937

## 2021-11-18 LAB — LIPID PANEL
Cholesterol: 210 mg/dL — ABNORMAL HIGH (ref 0–200)
HDL: 39 mg/dL — ABNORMAL LOW (ref >40)
LDL Cholesterol: 143 mg/dL — ABNORMAL HIGH (ref 0–99)
Total CHOL/HDL Ratio: 5.4 RATIO
Triglycerides: 140 mg/dL (ref <150)
VLDL: 28 mg/dL (ref 0–40)

## 2021-11-18 LAB — GLUCOSE, CAPILLARY
Glucose-Capillary: 179 mg/dL — ABNORMAL HIGH (ref 70–99)
Glucose-Capillary: 193 mg/dL — ABNORMAL HIGH (ref 70–99)
Glucose-Capillary: 118 mg/dL — ABNORMAL HIGH (ref 70–99)
Glucose-Capillary: 100 mg/dL — ABNORMAL HIGH (ref 70–99)
Glucose-Capillary: 112 mg/dL — ABNORMAL HIGH (ref 70–99)

## 2021-11-18 LAB — COMPREHENSIVE METABOLIC PANEL
ALT: 18 U/L (ref 0–44)
AST: 37 U/L (ref 15–41)
Albumin: 3.2 g/dL — ABNORMAL LOW (ref 3.5–5.0)
Alkaline Phosphatase: 75 U/L (ref 38–126)
Anion gap: 9 (ref 5–15)
BUN: 32 mg/dL — ABNORMAL HIGH (ref 8–23)
CO2: 25 mmol/L (ref 22–32)
Calcium: 8.8 mg/dL — ABNORMAL LOW (ref 8.9–10.3)
Chloride: 105 mmol/L (ref 98–111)
Creatinine, Ser: 1.85 mg/dL — ABNORMAL HIGH (ref 0.44–1.00)
GFR, Estimated: 28 mL/min — ABNORMAL LOW (ref >60)
Glucose, Bld: 177 mg/dL — ABNORMAL HIGH (ref 70–99)
Potassium: 4.9 mmol/L (ref 3.5–5.1)
Sodium: 139 mmol/L (ref 135–145)
Total Bilirubin: 1.3 mg/dL — ABNORMAL HIGH (ref 0.3–1.2)
Total Protein: 6.8 g/dL (ref 6.5–8.1)

## 2021-11-18 LAB — POCT ACTIVATED CLOTTING TIME
Activated Clotting Time: 263 seconds
Activated Clotting Time: 281 seconds

## 2021-11-18 LAB — SARS CORONAVIRUS 2 (TAT 6-24 HRS): SARS Coronavirus 2: POSITIVE — AB

## 2021-11-18 SURGERY — BYPASS GRAFT FEMORAL-POPLITEAL ARTERY
Anesthesia: General | Site: Leg Upper | Laterality: Left

## 2021-11-18 MED ORDER — CHLORHEXIDINE GLUCONATE 0.12 % MT SOLN
15.0000 mL | Freq: Once | OROMUCOSAL | Status: AC
  Administered 2021-11-18: 15 mL via OROMUCOSAL
  Filled 2021-11-18: qty 15

## 2021-11-18 MED ORDER — MIDAZOLAM HCL 2 MG/2ML IJ SOLN
0.5000 mg | Freq: Once | INTRAMUSCULAR | Status: AC
  Administered 2021-11-18: 0.5 mg via INTRAVENOUS

## 2021-11-18 MED ORDER — HEMOSTATIC AGENTS (NO CHARGE) OPTIME
TOPICAL | Status: DC | PRN
  Administered 2021-11-18: 1 via TOPICAL

## 2021-11-18 MED ORDER — CHLORTHALIDONE 25 MG PO TABS
25.0000 mg | ORAL_TABLET | ORAL | Status: DC
  Administered 2021-11-19: 25 mg via ORAL
  Filled 2021-11-18: qty 1

## 2021-11-18 MED ORDER — OXYCODONE HCL 5 MG/5ML PO SOLN
5.0000 mg | Freq: Once | ORAL | Status: AC | PRN
  Administered 2021-11-18: 5 mg via ORAL

## 2021-11-18 MED ORDER — AMLODIPINE BESYLATE 10 MG PO TABS
10.0000 mg | ORAL_TABLET | Freq: Every day | ORAL | Status: DC
  Administered 2021-11-19 – 2021-11-21 (×3): 10 mg via ORAL
  Filled 2021-11-18 (×3): qty 1

## 2021-11-18 MED ORDER — MORPHINE SULFATE (PF) 2 MG/ML IV SOLN
2.0000 mg | INTRAVENOUS | Status: DC | PRN
  Administered 2021-11-18: 5 mg via INTRAVENOUS
  Administered 2021-11-18: 4 mg via INTRAVENOUS
  Filled 2021-11-18: qty 2

## 2021-11-18 MED ORDER — ONDANSETRON HCL 4 MG/2ML IJ SOLN: 4.0000 mg | Freq: Once | INTRAMUSCULAR | Status: DC | PRN

## 2021-11-18 MED ORDER — PROPOFOL 10 MG/ML IV BOLUS
INTRAVENOUS | Status: AC
  Filled 2021-11-18: qty 20

## 2021-11-18 MED ORDER — FENTANYL CITRATE (PF) 100 MCG/2ML IJ SOLN
25.0000 ug | INTRAMUSCULAR | Status: DC | PRN
  Administered 2021-11-18: 50 ug via INTRAVENOUS
  Administered 2021-11-18 (×2): 25 ug via INTRAVENOUS
  Administered 2021-11-18: 50 ug via INTRAVENOUS

## 2021-11-18 MED ORDER — 0.9 % SODIUM CHLORIDE (POUR BTL) OPTIME
TOPICAL | Status: DC | PRN
  Administered 2021-11-18: 2000 mL

## 2021-11-18 MED ORDER — ACETAMINOPHEN 325 MG PO TABS
325.0000 mg | ORAL_TABLET | ORAL | Status: DC | PRN
  Administered 2021-11-19: 650 mg via ORAL
  Filled 2021-11-18: qty 2

## 2021-11-18 MED ORDER — FENTANYL CITRATE (PF) 100 MCG/2ML IJ SOLN
INTRAMUSCULAR | Status: AC
  Filled 2021-11-18: qty 2

## 2021-11-18 MED ORDER — HEPARIN 6000 UNIT IRRIGATION SOLUTION
Status: AC
  Filled 2021-11-18: qty 500

## 2021-11-18 MED ORDER — IRBESARTAN 300 MG PO TABS
300.0000 mg | ORAL_TABLET | Freq: Every day | ORAL | Status: DC
  Administered 2021-11-18 – 2021-11-21 (×4): 300 mg via ORAL
  Filled 2021-11-18 (×4): qty 1

## 2021-11-18 MED ORDER — INSULIN ASPART 100 UNIT/ML IJ SOLN
0.0000 [IU] | INTRAMUSCULAR | Status: DC | PRN
  Administered 2021-11-18: 2 [IU] via SUBCUTANEOUS

## 2021-11-18 MED ORDER — MORPHINE SULFATE (PF) 2 MG/ML IV SOLN
INTRAVENOUS | Status: AC
  Filled 2021-11-18: qty 1

## 2021-11-18 MED ORDER — VANCOMYCIN HCL IN DEXTROSE 1-5 GM/200ML-% IV SOLN
1000.0000 mg | Freq: Two times a day (BID) | INTRAVENOUS | Status: AC
  Administered 2021-11-18 – 2021-11-19 (×2): 1000 mg via INTRAVENOUS
  Filled 2021-11-18 (×2): qty 200

## 2021-11-18 MED ORDER — CITALOPRAM HYDROBROMIDE 20 MG PO TABS
40.0000 mg | ORAL_TABLET | Freq: Every day | ORAL | Status: DC
  Administered 2021-11-18 – 2021-11-19 (×2): 40 mg via ORAL
  Filled 2021-11-18 (×2): qty 2

## 2021-11-18 MED ORDER — DEXAMETHASONE SODIUM PHOSPHATE 10 MG/ML IJ SOLN
INTRAMUSCULAR | Status: AC
  Filled 2021-11-18: qty 1

## 2021-11-18 MED ORDER — COQ10 100 MG PO CAPS: 100.0000 mg | ORAL_CAPSULE | Freq: Every day | ORAL | Status: DC

## 2021-11-18 MED ORDER — GUAIFENESIN-DM 100-10 MG/5ML PO SYRP: 15.0000 mL | ORAL_SOLUTION | ORAL | Status: DC | PRN

## 2021-11-18 MED ORDER — ACETAMINOPHEN 650 MG RE SUPP: 325.0000 mg | RECTAL | Status: DC | PRN

## 2021-11-18 MED ORDER — ROPINIROLE HCL 0.25 MG PO TABS
0.2500 mg | ORAL_TABLET | Freq: Every day | ORAL | Status: DC
  Administered 2021-11-18 – 2021-11-20 (×3): 0.25 mg via ORAL
  Filled 2021-11-18 (×4): qty 1

## 2021-11-18 MED ORDER — ACETAMINOPHEN 10 MG/ML IV SOLN
INTRAVENOUS | Status: AC
  Filled 2021-11-18: qty 100

## 2021-11-18 MED ORDER — HEPARIN 6000 UNIT IRRIGATION SOLUTION
Status: DC | PRN
  Administered 2021-11-18: 1

## 2021-11-18 MED ORDER — OXYCODONE-ACETAMINOPHEN 5-325 MG PO TABS
1.0000 | ORAL_TABLET | ORAL | Status: DC | PRN
  Administered 2021-11-19 – 2021-11-20 (×5): 1 via ORAL
  Filled 2021-11-18 (×6): qty 1

## 2021-11-18 MED ORDER — MAGNESIUM OXIDE -MG SUPPLEMENT 400 (240 MG) MG PO TABS
400.0000 mg | ORAL_TABLET | Freq: Every day | ORAL | Status: DC
  Administered 2021-11-18 – 2021-11-21 (×4): 400 mg via ORAL
  Filled 2021-11-18 (×4): qty 1

## 2021-11-18 MED ORDER — MAGNESIUM SULFATE 2 GM/50ML IV SOLN: 2.0000 g | Freq: Every day | INTRAVENOUS | Status: DC | PRN

## 2021-11-18 MED ORDER — ROCURONIUM BROMIDE 10 MG/ML (PF) SYRINGE
PREFILLED_SYRINGE | INTRAVENOUS | Status: DC | PRN
  Administered 2021-11-18: 20 mg via INTRAVENOUS
  Administered 2021-11-18: 100 mg via INTRAVENOUS

## 2021-11-18 MED ORDER — ONDANSETRON HCL 4 MG/2ML IJ SOLN: 4.0000 mg | Freq: Four times a day (QID) | INTRAMUSCULAR | Status: DC | PRN

## 2021-11-18 MED ORDER — PHENYLEPHRINE HCL-NACL 20-0.9 MG/250ML-% IV SOLN
INTRAVENOUS | Status: DC | PRN
  Administered 2021-11-18: 25 ug/min via INTRAVENOUS

## 2021-11-18 MED ORDER — ORAL CARE MOUTH RINSE: 15.0000 mL | Freq: Once | OROMUCOSAL | Status: AC

## 2021-11-18 MED ORDER — LACTATED RINGERS IV SOLN: INTRAVENOUS | Status: DC | PRN

## 2021-11-18 MED ORDER — AMISULPRIDE (ANTIEMETIC) 5 MG/2ML IV SOLN
10.0000 mg | Freq: Once | INTRAVENOUS | Status: AC | PRN
  Administered 2021-11-18: 10 mg via INTRAVENOUS

## 2021-11-18 MED ORDER — ALUM & MAG HYDROXIDE-SIMETH 200-200-20 MG/5ML PO SUSP: 15.0000 mL | ORAL | Status: DC | PRN

## 2021-11-18 MED ORDER — MIDAZOLAM HCL 2 MG/2ML IJ SOLN
INTRAMUSCULAR | Status: AC
  Filled 2021-11-18: qty 2

## 2021-11-18 MED ORDER — MECLIZINE HCL 12.5 MG PO TABS
12.5000 mg | ORAL_TABLET | Freq: Three times a day (TID) | ORAL | Status: DC | PRN
  Filled 2021-11-18: qty 1

## 2021-11-18 MED ORDER — HYDRALAZINE HCL 20 MG/ML IJ SOLN: 5.0000 mg | INTRAMUSCULAR | Status: DC | PRN

## 2021-11-18 MED ORDER — DIPHENHYDRAMINE HCL 50 MG/ML IJ SOLN
INTRAMUSCULAR | Status: DC | PRN
  Administered 2021-11-18: 12.5 mg via INTRAVENOUS

## 2021-11-18 MED ORDER — CARBOXYMETHYLCELLULOSE SODIUM 0.25 % OP SOLN: 1.0000 [drp] | Freq: Every day | OPHTHALMIC | Status: DC | PRN

## 2021-11-18 MED ORDER — INSULIN ASPART 100 UNIT/ML IJ SOLN
0.0000 [IU] | Freq: Three times a day (TID) | INTRAMUSCULAR | Status: DC
  Administered 2021-11-19: 11 [IU] via SUBCUTANEOUS
  Administered 2021-11-19: 5 [IU] via SUBCUTANEOUS
  Administered 2021-11-19 – 2021-11-20 (×2): 3 [IU] via SUBCUTANEOUS
  Administered 2021-11-20: 2 [IU] via SUBCUTANEOUS
  Administered 2021-11-20 – 2021-11-21 (×2): 3 [IU] via SUBCUTANEOUS

## 2021-11-18 MED ORDER — OXYCODONE HCL 5 MG PO TABS: 5.0000 mg | ORAL_TABLET | Freq: Once | ORAL | Status: AC | PRN

## 2021-11-18 MED ORDER — PROPOFOL 10 MG/ML IV BOLUS
INTRAVENOUS | Status: DC | PRN
Start: 2021-11-18 — End: 2021-11-18
  Administered 2021-11-18: 100 mg via INTRAVENOUS

## 2021-11-18 MED ORDER — FENTANYL CITRATE (PF) 250 MCG/5ML IJ SOLN
INTRAMUSCULAR | Status: AC
  Filled 2021-11-18: qty 5

## 2021-11-18 MED ORDER — POTASSIUM CHLORIDE CRYS ER 20 MEQ PO TBCR: 20.0000 meq | EXTENDED_RELEASE_TABLET | Freq: Every day | ORAL | Status: DC | PRN

## 2021-11-18 MED ORDER — FENTANYL CITRATE (PF) 250 MCG/5ML IJ SOLN
INTRAMUSCULAR | Status: AC
Start: 2021-11-18 — End: ?
  Filled 2021-11-18: qty 5

## 2021-11-18 MED ORDER — LACTATED RINGERS IV SOLN: INTRAVENOUS | Status: DC

## 2021-11-18 MED ORDER — OXYCODONE HCL 5 MG/5ML PO SOLN
ORAL | Status: AC
  Filled 2021-11-18: qty 5

## 2021-11-18 MED ORDER — METOPROLOL TARTRATE 5 MG/5ML IV SOLN: 2.0000 mg | INTRAVENOUS | Status: DC | PRN

## 2021-11-18 MED ORDER — SODIUM CHLORIDE 0.9 % IV SOLN: INTRAVENOUS | Status: DC

## 2021-11-18 MED ORDER — PHENOL 1.4 % MT LIQD: 1.0000 | OROMUCOSAL | Status: DC | PRN

## 2021-11-18 MED ORDER — LABETALOL HCL 5 MG/ML IV SOLN: 10.0000 mg | INTRAVENOUS | Status: DC | PRN

## 2021-11-18 MED ORDER — SODIUM CHLORIDE 0.9 % IV SOLN: 500.0000 mL | Freq: Once | INTRAVENOUS | Status: DC | PRN

## 2021-11-18 MED ORDER — DOCUSATE SODIUM 100 MG PO CAPS
100.0000 mg | ORAL_CAPSULE | Freq: Every day | ORAL | Status: DC
  Administered 2021-11-19 – 2021-11-21 (×3): 100 mg via ORAL
  Filled 2021-11-18 (×3): qty 1

## 2021-11-18 MED ORDER — FENTANYL CITRATE (PF) 250 MCG/5ML IJ SOLN
INTRAMUSCULAR | Status: DC | PRN
  Administered 2021-11-18: 50 ug via INTRAVENOUS
  Administered 2021-11-18: 25 ug via INTRAVENOUS
  Administered 2021-11-18: 75 ug via INTRAVENOUS
  Administered 2021-11-18: 50 ug via INTRAVENOUS
  Administered 2021-11-18: 25 ug via INTRAVENOUS
  Administered 2021-11-18: 100 ug via INTRAVENOUS
  Administered 2021-11-18: 25 ug via INTRAVENOUS

## 2021-11-18 MED ORDER — SENNOSIDES-DOCUSATE SODIUM 8.6-50 MG PO TABS: 1.0000 | ORAL_TABLET | Freq: Every evening | ORAL | Status: DC | PRN

## 2021-11-18 MED ORDER — PHENYLEPHRINE 40 MCG/ML (10ML) SYRINGE FOR IV PUSH (FOR BLOOD PRESSURE SUPPORT)
PREFILLED_SYRINGE | INTRAVENOUS | Status: DC | PRN
  Administered 2021-11-18: 120 ug via INTRAVENOUS
  Administered 2021-11-18 (×2): 80 ug via INTRAVENOUS

## 2021-11-18 MED ORDER — MORPHINE SULFATE (PF) 4 MG/ML IV SOLN
INTRAVENOUS | Status: AC
  Filled 2021-11-18: qty 1

## 2021-11-18 MED ORDER — POLYVINYL ALCOHOL 1.4 % OP SOLN
1.0000 [drp] | Freq: Every day | OPHTHALMIC | Status: DC | PRN
  Filled 2021-11-18: qty 15

## 2021-11-18 MED ORDER — PROTAMINE SULFATE 10 MG/ML IV SOLN
INTRAVENOUS | Status: DC | PRN
  Administered 2021-11-18: 50 mg via INTRAVENOUS

## 2021-11-18 MED ORDER — ROSUVASTATIN CALCIUM 20 MG PO TABS
40.0000 mg | ORAL_TABLET | Freq: Every day | ORAL | Status: DC
  Administered 2021-11-18 – 2021-11-19 (×2): 40 mg via ORAL
  Filled 2021-11-18 (×2): qty 2

## 2021-11-18 MED ORDER — HYDRALAZINE HCL 20 MG/ML IJ SOLN
INTRAMUSCULAR | Status: DC | PRN
  Administered 2021-11-18 (×4): 5 mg via INTRAVENOUS

## 2021-11-18 MED ORDER — ACETAMINOPHEN 10 MG/ML IV SOLN
INTRAVENOUS | Status: DC | PRN
Start: 2021-11-18 — End: 2021-11-18
  Administered 2021-11-18: 1000 mg via INTRAVENOUS

## 2021-11-18 MED ORDER — VANCOMYCIN HCL IN DEXTROSE 1-5 GM/200ML-% IV SOLN
1000.0000 mg | INTRAVENOUS | Status: AC
  Administered 2021-11-18: 1000 mg via INTRAVENOUS
  Filled 2021-11-18: qty 200

## 2021-11-18 MED ORDER — LIDOCAINE 2% (20 MG/ML) 5 ML SYRINGE
INTRAMUSCULAR | Status: DC | PRN
  Administered 2021-11-18: 60 mg via INTRAVENOUS

## 2021-11-18 MED ORDER — HEPARIN SODIUM (PORCINE) 1000 UNIT/ML IJ SOLN
INTRAMUSCULAR | Status: DC | PRN
Start: 2021-11-18 — End: 2021-11-18
  Administered 2021-11-18: 8500 [IU] via INTRAVENOUS
  Administered 2021-11-18: 1000 [IU] via INTRAVENOUS

## 2021-11-18 MED ORDER — ONDANSETRON HCL 4 MG/2ML IJ SOLN
INTRAMUSCULAR | Status: DC | PRN
  Administered 2021-11-18: 4 mg via INTRAVENOUS

## 2021-11-18 MED ORDER — AMISULPRIDE (ANTIEMETIC) 5 MG/2ML IV SOLN
INTRAVENOUS | Status: AC
  Filled 2021-11-18: qty 4

## 2021-11-18 MED ORDER — PANTOPRAZOLE SODIUM 40 MG PO TBEC
40.0000 mg | DELAYED_RELEASE_TABLET | Freq: Every day | ORAL | Status: DC
  Administered 2021-11-18 – 2021-11-21 (×4): 40 mg via ORAL
  Filled 2021-11-18 (×4): qty 1

## 2021-11-18 MED ORDER — CHLORHEXIDINE GLUCONATE CLOTH 2 % EX PADS: 6.0000 | MEDICATED_PAD | Freq: Once | CUTANEOUS | Status: DC

## 2021-11-18 MED ORDER — HEPARIN SODIUM (PORCINE) 5000 UNIT/ML IJ SOLN
5000.0000 [IU] | Freq: Three times a day (TID) | INTRAMUSCULAR | Status: DC
  Administered 2021-11-18 – 2021-11-21 (×7): 5000 [IU] via SUBCUTANEOUS
  Filled 2021-11-18 (×7): qty 1

## 2021-11-18 MED ORDER — ASPIRIN EC 325 MG PO TBEC
325.0000 mg | DELAYED_RELEASE_TABLET | Freq: Every day | ORAL | Status: DC
  Administered 2021-11-18 – 2021-11-20 (×3): 325 mg via ORAL
  Filled 2021-11-18 (×3): qty 1

## 2021-11-18 MED ORDER — FLEET ENEMA 7-19 GM/118ML RE ENEM: 1.0000 | ENEMA | Freq: Once | RECTAL | Status: DC | PRN

## 2021-11-18 MED ORDER — HEPARIN SODIUM (PORCINE) 1000 UNIT/ML IJ SOLN
INTRAMUSCULAR | Status: AC
  Filled 2021-11-18: qty 10

## 2021-11-18 MED ORDER — MAGNESIUM 200 MG PO TABS
400.0000 mg | ORAL_TABLET | Freq: Every day | ORAL | Status: DC
  Filled 2021-11-18: qty 2

## 2021-11-18 MED ORDER — ALBUTEROL SULFATE HFA 108 (90 BASE) MCG/ACT IN AERS
INHALATION_SPRAY | RESPIRATORY_TRACT | Status: AC
  Filled 2021-11-18: qty 13.4

## 2021-11-18 MED ORDER — BISACODYL 5 MG PO TBEC: 5.0000 mg | DELAYED_RELEASE_TABLET | Freq: Every day | ORAL | Status: DC | PRN

## 2021-11-18 SURGICAL SUPPLY — 65 items
ADH SKN CLS APL DERMABOND .7 (GAUZE/BANDAGES/DRESSINGS) ×5
AGENT HMST KT MTR STRL THRMB (HEMOSTASIS) ×1
BAG COUNTER SPONGE SURGICOUNT (BAG) ×3 IMPLANT
BAG SPNG CNTER NS LX DISP (BAG) ×1
BANDAGE ESMARK 6X9 LF (GAUZE/BANDAGES/DRESSINGS) IMPLANT
BNDG CMPR 9X6 STRL LF SNTH (GAUZE/BANDAGES/DRESSINGS) ×1
BNDG ELASTIC 4X5.8 VLCR STR LF (GAUZE/BANDAGES/DRESSINGS) ×1 IMPLANT
BNDG ESMARK 6X9 LF (GAUZE/BANDAGES/DRESSINGS) ×2
CANISTER SUCT 3000ML PPV (MISCELLANEOUS) ×3 IMPLANT
CANNULA VESSEL 3MM 2 BLNT TIP (CANNULA) ×3 IMPLANT
CLIP TI WIDE RED SMALL 24 (CLIP) ×1 IMPLANT
CLIP VESOCCLUDE MED 24/CT (CLIP) ×3 IMPLANT
CLIP VESOCCLUDE SM WIDE 24/CT (CLIP) ×3 IMPLANT
CUFF TOURN SGL QUICK 24 (TOURNIQUET CUFF)
CUFF TOURN SGL QUICK 30 (TOURNIQUET CUFF) ×2
CUFF TOURN SGL QUICK 34 (TOURNIQUET CUFF)
CUFF TOURN SGL QUICK 42 (TOURNIQUET CUFF) IMPLANT
CUFF TRNQT CYL 24X4X16.5-23 (TOURNIQUET CUFF) IMPLANT
CUFF TRNQT CYL 30X4X21-28X (TOURNIQUET CUFF) IMPLANT
CUFF TRNQT CYL 34X4.125X (TOURNIQUET CUFF) IMPLANT
DERMABOND ADVANCED (GAUZE/BANDAGES/DRESSINGS) ×5
DERMABOND ADVANCED .7 DNX12 (GAUZE/BANDAGES/DRESSINGS) ×2 IMPLANT
DRAIN CHANNEL 15F RND FF W/TCR (WOUND CARE) IMPLANT
DRAPE HALF SHEET 40X57 (DRAPES) IMPLANT
DRAPE X-RAY CASS 24X20 (DRAPES) IMPLANT
ELECT REM PT RETURN 9FT ADLT (ELECTROSURGICAL) ×2
ELECTRODE REM PT RTRN 9FT ADLT (ELECTROSURGICAL) ×2 IMPLANT
EVACUATOR SILICONE 100CC (DRAIN) IMPLANT
GLOVE SRG 8 PF TXTR STRL LF DI (GLOVE) ×2 IMPLANT
GLOVE SURG POLYISO LF SZ7.5 (GLOVE) ×3 IMPLANT
GLOVE SURG UNDER POLY LF SZ8 (GLOVE) ×2
GOWN STRL REUS W/ TWL LRG LVL3 (GOWN DISPOSABLE) ×4 IMPLANT
GOWN STRL REUS W/ TWL XL LVL3 (GOWN DISPOSABLE) ×2 IMPLANT
GOWN STRL REUS W/TWL LRG LVL3 (GOWN DISPOSABLE) ×8
GOWN STRL REUS W/TWL XL LVL3 (GOWN DISPOSABLE) ×2
HEMOSTAT SNOW SURGICEL 2X4 (HEMOSTASIS) IMPLANT
INSERT FOGARTY SM (MISCELLANEOUS) ×1 IMPLANT
KIT BASIN OR (CUSTOM PROCEDURE TRAY) ×3 IMPLANT
KIT TURNOVER KIT B (KITS) ×3 IMPLANT
MARKER GRAFT CORONARY BYPASS (MISCELLANEOUS) IMPLANT
NS IRRIG 1000ML POUR BTL (IV SOLUTION) ×6 IMPLANT
PACK PERIPHERAL VASCULAR (CUSTOM PROCEDURE TRAY) ×3 IMPLANT
PAD ARMBOARD 7.5X6 YLW CONV (MISCELLANEOUS) ×6 IMPLANT
SET COLLECT BLD 21X3/4 12 (NEEDLE) IMPLANT
STOPCOCK 4 WAY LG BORE MALE ST (IV SETS) IMPLANT
SURGIFLO W/THROMBIN 8M KIT (HEMOSTASIS) ×1 IMPLANT
SUT ETHILON 3 0 PS 1 (SUTURE) IMPLANT
SUT GORETEX 6.0 TT13 (SUTURE) IMPLANT
SUT GORETEX 6.0 TT9 (SUTURE) IMPLANT
SUT PROLENE 5 0 C 1 24 (SUTURE) ×4 IMPLANT
SUT PROLENE 6 0 BV (SUTURE) ×5 IMPLANT
SUT PROLENE 7 0 BV 1 (SUTURE) ×3 IMPLANT
SUT SILK 2 0 SH (SUTURE) ×3 IMPLANT
SUT SILK 3 0 (SUTURE) ×8
SUT SILK 3-0 18XBRD TIE 12 (SUTURE) IMPLANT
SUT VIC AB 2-0 CT1 27 (SUTURE) ×6
SUT VIC AB 2-0 CT1 TAPERPNT 27 (SUTURE) ×4 IMPLANT
SUT VIC AB 3-0 SH 27 (SUTURE) ×10
SUT VIC AB 3-0 SH 27X BRD (SUTURE) ×4 IMPLANT
SUT VICRYL 4-0 PS2 18IN ABS (SUTURE) ×7 IMPLANT
TOWEL GREEN STERILE (TOWEL DISPOSABLE) ×3 IMPLANT
TRAY FOLEY MTR SLVR 16FR STAT (SET/KITS/TRAYS/PACK) ×3 IMPLANT
TUBING EXTENTION W/L.L. (IV SETS) IMPLANT
UNDERPAD 30X36 HEAVY ABSORB (UNDERPADS AND DIAPERS) ×3 IMPLANT
WATER STERILE IRR 1000ML POUR (IV SOLUTION) ×3 IMPLANT

## 2021-11-18 NOTE — Anesthesia Procedure Notes (Signed)
Procedure Name: Intubation ?Date/Time: 11/18/2021 12:07 PM ?Performed by: Leonor Liv, CRNA ?Pre-anesthesia Checklist: Patient identified, Emergency Drugs available, Suction available and Patient being monitored ?Patient Re-evaluated:Patient Re-evaluated prior to induction ?Oxygen Delivery Method: Circle System Utilized ?Preoxygenation: Pre-oxygenation with 100% oxygen ?Induction Type: IV induction ?Ventilation: Mask ventilation without difficulty ?Laryngoscope Size: Mac and 3 ?Grade View: Grade II ?Tube type: Oral ?Tube size: 7.0 mm ?Number of attempts: 1 ?Airway Equipment and Method: Stylet and Oral airway ?Placement Confirmation: ETT inserted through vocal cords under direct vision, positive ETCO2 and breath sounds checked- equal and bilateral ?Secured at: 21 cm ?Tube secured with: Tape ?Dental Injury: Teeth and Oropharynx as per pre-operative assessment  ? ? ? ? ?

## 2021-11-18 NOTE — Op Note (Signed)
? ? ?Patient name: [name] MRN: [id number] DOB: 1946-08-28 Sex: female ? ?11/18/2021 ?Pre-operative Diagnosis: Left leg ulcer ?Post-operative diagnosis:  Same ?Surgeon:  [name] ?Assistants:  Laurence Slate ?Procedure:   Left femoral to below-knee popliteal artery bypass graft with ipsilateral translocated nonreversed saphenous vein ?Anesthesia:  General ?Blood Loss:  100cc ?Specimens:  none ? ?Findings: I was initially planning on doing an above-knee popliteal bypass, however the artery was heavily calcified at this level.  The below-knee popliteal artery was a soft artery but small in caliber approximately 3 mm.  The vein was adequate down to the knee and then became smaller in caliber about 3 mm.  It was a good size match for the artery.  There was nonocclusive common femoral plaque that did not require endarterectomy. ? ?Indications: This is a 76 year old female with a left leg ulcer.  She underwent angiography yesterday that revealed an occluded superficial femoral artery with reconstitution of an above-knee popliteal artery.  She comes in today for surgical bypass as she did not have good endovascular options.  The risks and benefits of the operation were discussed with the patient and her daughter who wished to proceed. ? ?Procedure:  The patient was identified in the holding area and taken to [facility]  The patient was then placed supine on the table. general anesthesia was administered.  The patient was prepped and draped in the usual sterile fashion.  A time out was called and antibiotics were administered.  A PA was necessary to expedite the procedure.  The PA assisted with exposure of the artery and harvesting of the vein by providing traction and countertraction.  They also assisted with vein branch ligation, suture following for the anastomoses, and wound closure. ? ?Ultrasound was used to evaluate the saphenous vein in the leg.  This was an adequate vein for bypass.  I began by making an  oblique incision in the left groin.  Cautery was used divide subcutaneous tissue down to the femoral sheath which was opened sharply.  I exposed the common femoral artery from the inguinal ligament down to the bifurcation.  The profunda and superficial femoral artery were individually isolated.  I then identified the femoral vein and the saphenofemoral junction.  I proceeded to harvest the saphenous vein through skip incisions down the leg.  The vein was healthy about 4 mm.  Through the distal medial above-knee vein harvest incision, I opened the fascia with cautery and exposed the popliteal space.  I then dissected out the popliteal artery.  This was circumferentially calcified.  I did not think that it was going to be an adequate distal target and so I continued with the saphenous vein dissection down below the knee.  I also exposed the below-knee popliteal artery through this incision.  The artery was small in caliber measuring about 3 mm however it was soft.  Once I had adequate vein length, it was ligated distally with a 2-0 silk tie.  The saphenofemoral junction was also ligated with a 2-0 silk tie and the vein was removed.  I then prepared the vein on the back table.  It distended nicely.  It was marked for orientation.  I then created a subsartorial tunnel.  The patient was then fully heparinized.  After the heparin circulated the femoral artery was occluded with vascular clamps.  A #11 blade was used to make an arteriotomy which was extended longitudinally with Potts scissors.  The arteriotomy went down to the origin  of the profundofemoral artery.  The vein was then spatulated to fit the size the arteriotomy in a running anastomosis was created with 5-0 Prolene.  Once this was completed the clamps were released.  I then used a valvulotome to lyse the valves.  4 passes were made.  There was excellent pulsatile flow after lysing the valves.  The vein was then sutured to the tunneler and brought through the  tunnel making sure to maintain proper orientation.  A tourniquet was placed on the upper thigh and the leg was exsanguinated with an Esmarch.  The tourniquet was taken to 250 mm of pressure.  I then opened the below-knee popliteal artery.  This was a soft disease-free artery.  The arteriotomy was extended with Potts scissors.  The leg was then straightened and the vein graft cut the appropriate length and spatulated to fit the size the arteriotomy.  A running anastomosis was then created with 6-0 Prolene.  Prior to completion the tourniquet was let down and the appropriate flushing maneuvers were performed and the anastomosis was completed.  At this point, the patient had a graft dependent posterior tibial and anterior tibial Doppler signal.  The wounds were then copiously irrigated.  The vein harvest incisions were closed with 2 layers of Vicryl and Dermabond.  The groin incision was closed by reapproximating the femoral sheath with 2-0 Vicryl in the subcutaneous tissue with additional layers of Vicryl followed by subcuticular closure.  The below-knee incision was closed by reapproximating the fascia with 2-0 Vicryl, the subcutaneous tissue with Vicryl and subcuticular suture closure.  Dermabond was applied.  There were no immediate complications. ? ? ?Disposition: To PACU stable. ? ? ?V. [name], M.D., FACS ?[facility] ?Office: [phone] ?Pager:  [phone]  ?

## 2021-11-18 NOTE — Progress Notes (Signed)
S/p left Pham below-knee pop bypass graft with vein ?She was having some shooting burning pains in her foot in the PACU.  She has just received 4 mg of morphine and appears to be comfortable. ?Brisk PT and DP Doppler signals. ? ?[name] ?

## 2021-11-18 NOTE — Anesthesia Preprocedure Evaluation (Addendum)
Anesthesia Evaluation  ?Patient identified by MRN, date of birth, ID band ?Patient awake ? ? ? ?Reviewed: ?Allergy & Precautions, NPO status , Patient's Chart, lab work & pertinent test results ? ?History of Anesthesia Complications ?(+) history of anesthetic complications (daughter w/ PONV) ? ?Airway ?Mallampati: III ? ?TM Distance: >3 FB ?Neck ROM: Full ? ? ? Dental ? ?(+) Teeth Intact, Dental Advisory Given ?  ?Pulmonary ?neg shortness of breath, COPD (has never had any inhalers ), Current Smoker,  ?COVID + 2/28, tested positive about a month ago also so was tested her erroneously- no contact precautions  ?16 pack year history, <1/2ppd currently ? ?  ?Pulmonary exam normal ?breath sounds clear to auscultation ? ? ? ? ? ? Cardiovascular ?hypertension (157/48 in preop, per pt normally 150s), Pt. on medications ?+ CAD, + Peripheral Vascular Disease and +CHF (grade 1 diastolic dysfunction)  ?Normal cardiovascular exam ?Rhythm:Regular Rate:Normal ? ?Echo 2018: ?- Left ventricle: The cavity size was normal. Wall thickness was  ???normal. Systolic function was normal. The estimated ejection  ???fraction was in the range of 60% to 65%. Wall motion was normal;  ???there were no regional wall motion abnormalities. Doppler  ???parameters are consistent with abnormal left ventricular  ???relaxation (grade 1 diastolic dysfunction).  ?  ?Neuro/Psych ? Headaches, PSYCHIATRIC DISORDERS Anxiety Depression  Neuromuscular disease (peripheral neuropathy ) CVA (R sided weakness, numbness), Residual Symptoms   ? GI/Hepatic ?Neg liver ROS, hiatal hernia, GERD  Controlled and Medicated,  ?Endo/Other  ?diabetes, Well Controlled, Type 2, Insulin Dependenta1c 7.1 ?FS 179 in preop ? Renal/GU ?Renal diseaseCKD 4, Cr 1.79  ?negative genitourinary ?  ?Musculoskeletal ? ?(+) Arthritis , Osteoarthritis,   ? Abdominal ?  ?Peds ?negative pediatric ROS ?(+)  Hematology ?hct 45.4   ?Anesthesia Other Findings ? ?  Reproductive/Obstetrics ?negative OB ROS ? ?  ? ? ? ? ? ? ? ? ? ? ? ? ? ?  ?  ? ? ? ? ? ? ?Anesthesia Physical ?Anesthesia Plan ? ?ASA: 3 ? ?Anesthesia Plan: General  ? ?Post-op Pain Management: Ofirmev IV (intra-op)*  ? ?Induction: Intravenous ? ?PONV Risk Score and Plan: 2 and Ondansetron and Treatment may vary due to age or medical condition ? ?Airway Management Planned: Oral ETT ? ?Additional Equipment: Arterial line ? ?Intra-op Plan:  ? ?Post-operative Plan: Extubation in OR ? ?Informed Consent: I have reviewed the patients History and Physical, chart, labs and discussed the procedure including the risks, benefits and alternatives for the proposed anesthesia with the patient or authorized representative who has indicated his/her understanding and acceptance.  ? ? ? ?Dental advisory given ? ?Plan Discussed with: CRNA ? ?Anesthesia Plan Comments:   ? ? ? ? ? ? ?Anesthesia Quick Evaluation ? ?

## 2021-11-18 NOTE — Progress Notes (Signed)
Patient stated she was tested for Covid at [facility] on [facility] before January 17th and was positive. Removed contact precautions patient asymptomatic.  ?

## 2021-11-18 NOTE — Progress Notes (Signed)
Dr. Trula Slade aware of patient's covid test result and will proceed with procedure today.  ?

## 2021-11-18 NOTE — Transfer of Care (Signed)
Immediate Anesthesia Transfer of Care Note ? ?Patient: [name] ? ?Procedure(s) Performed: LEFT FEMORAL-POPLITEAL ARTERY BYPASS GRAFT (Left: Leg Upper) ? ?Patient Location: PACU ? ?Anesthesia Type:General ? ?Level of Consciousness: drowsy and patient cooperative ? ?Airway & Oxygen Therapy: Patient Spontanous Breathing ? ?Post-op Assessment: Report given to RN and Post -op Vital signs reviewed and stable ? ?Post vital signs: Reviewed and stable ? ?Last Vitals:  ?Vitals Value Taken Time  ?BP 150/47 11/18/21 1706  ?Temp    ?Pulse 72 11/18/21 1713  ?Resp 18 11/18/21 1713  ?SpO2 95 % 11/18/21 1713  ?Vitals shown include unvalidated device data. ? ?Last Pain:  ?Vitals:  ? 11/18/21 1107  ?TempSrc:   ?PainSc: 0-No pain  ?   ? ?  ? ?Complications: No notable events documented. ?

## 2021-11-18 NOTE — Interval H&P Note (Signed)
History and Physical Interval Note: ? ?11/18/2021 ?10:52 AM ? ?[name]  has presented today for surgery, with the diagnosis of I70.243.  The various methods of treatment have been discussed with the patient and family. After consideration of risks, benefits and other options for treatment, the patient has consented to  Procedure(s): ?LEFT FEMORAL-POPLITEAL ARTERY BYPASS GRAFT (Left) as a surgical intervention.  The patient's history has been reviewed, patient examined, no change in status, stable for surgery.  I have reviewed the patient's chart and labs.  Questions were answered to the patient's satisfaction.   ? ? ?Wells Jennier Schissler ? ?Angio yesterday showed occluded left SFA.  Discussed proceeding with left fem pop bypass.  All questions answered ? ?WB ?

## 2021-11-19 ENCOUNTER — Encounter (HOSPITAL_COMMUNITY): Payer: Self-pay | Admitting: Surgery

## 2021-11-19 LAB — BASIC METABOLIC PANEL
Anion gap: 7 (ref 5–15)
BUN: 28 mg/dL — ABNORMAL HIGH (ref 8–23)
CO2: 27 mmol/L (ref 22–32)
Calcium: 8.2 mg/dL — ABNORMAL LOW (ref 8.9–10.3)
Chloride: 103 mmol/L (ref 98–111)
Creatinine, Ser: 1.8 mg/dL — ABNORMAL HIGH (ref 0.44–1.00)
GFR, Estimated: 29 mL/min — ABNORMAL LOW (ref >60)
Glucose, Bld: 165 mg/dL — ABNORMAL HIGH (ref 70–99)
Potassium: 4.7 mmol/L (ref 3.5–5.1)
Sodium: 137 mmol/L (ref 135–145)

## 2021-11-19 LAB — LIPID PANEL
Cholesterol: 159 mg/dL (ref 0–200)
HDL: 32 mg/dL — ABNORMAL LOW (ref >40)
LDL Cholesterol: 102 mg/dL — ABNORMAL HIGH (ref 0–99)
Total CHOL/HDL Ratio: 5 RATIO
Triglycerides: 127 mg/dL (ref <150)
VLDL: 25 mg/dL (ref 0–40)

## 2021-11-19 LAB — GLUCOSE, CAPILLARY
Glucose-Capillary: 165 mg/dL — ABNORMAL HIGH (ref 70–99)
Glucose-Capillary: 242 mg/dL — ABNORMAL HIGH (ref 70–99)
Glucose-Capillary: 312 mg/dL — ABNORMAL HIGH (ref 70–99)
Glucose-Capillary: 156 mg/dL — ABNORMAL HIGH (ref 70–99)

## 2021-11-19 LAB — CBC
HCT: 38.6 % (ref 36.0–46.0)
Hemoglobin: 12.6 g/dL (ref 12.0–15.0)
MCH: 33.2 pg (ref 26.0–34.0)
MCHC: 32.6 g/dL (ref 30.0–36.0)
MCV: 101.6 fL — ABNORMAL HIGH (ref 80.0–100.0)
Platelets: 225 10*3/uL (ref 150–400)
RBC: 3.8 MIL/uL — ABNORMAL LOW (ref 3.87–5.11)
RDW: 12.1 % (ref 11.5–15.5)
WBC: 13 10*3/uL — ABNORMAL HIGH (ref 4.0–10.5)
nRBC: 0 % (ref 0.0–0.2)

## 2021-11-19 MED ORDER — ATORVASTATIN CALCIUM 80 MG PO TABS
80.0000 mg | ORAL_TABLET | Freq: Every day | ORAL | Status: DC
  Administered 2021-11-20 – 2021-11-21 (×2): 80 mg via ORAL
  Filled 2021-11-19 (×2): qty 1

## 2021-11-19 MED ORDER — SULFAMETHOXAZOLE-TRIMETHOPRIM 400-80 MG PO TABS
1.0000 | ORAL_TABLET | Freq: Two times a day (BID) | ORAL | Status: DC
  Administered 2021-11-19 – 2021-11-21 (×5): 1 via ORAL
  Filled 2021-11-19 (×5): qty 1

## 2021-11-19 NOTE — Evaluation (Signed)
Physical Therapy Evaluation Patient Details Name: [name] MRN: [id number] DOB: 1946-07-30 Today's Date: 11/19/2021  History of Present Illness  76 y.o. female presented 11/16/21 with arterial insufficiencey with ischemic ulcer LLE.  s/p L femoral to BK popliteal with PTFE 3/1. PMH: Emphasema, anxiety, neuropathy, PVD, DM, L CVA withy residual RU/LE numbness, CKD, HTN.   Clinical Impression  Pt presents with condition above and deficits mentioned below, see PT Problem List. PTA, she was independent without DME, working, and living with her husband in a 1-level house with 4 STE. Currently, pt is requiring up to minA and several minutes to complete transfers and min guard assist to ambulate household distances slowly with a RW. Pt displays deficits in L knee flexion and ankle dorsiflexion ROM secondary to edema and is primarily limited by pain. Educated pt on stretching leg. I suspect pt will progress quickly as pain improves. Recommend follow-up with HHPT and continued acute PT to maximize her return to baseline.     Recommendations for follow up therapy are one component of a multi-disciplinary discharge planning process, led by the attending physician.  Recommendations may be updated based on patient status, additional functional criteria and insurance authorization.  Follow Up Recommendations Home health PT    Assistance Recommended at Discharge Intermittent Supervision/Assistance  Patient can return home with the following  A little help with walking and/or transfers;A little help with bathing/dressing/bathroom;Assistance with cooking/housework;Assist for transportation;Help with stairs or ramp for entrance    Equipment Recommendations None recommended by PT  Recommendations for Other Services       Functional Status Assessment Patient has had a recent decline in their functional status and demonstrates the ability to make significant improvements in function in a reasonable and  predictable amount of time.     Precautions / Restrictions Precautions Precautions: Fall Precaution Comments: watch SpO2 Restrictions Weight Bearing Restrictions: No      Mobility  Bed Mobility               General bed mobility comments: OOB in chair    Transfers Overall transfer level: Needs assistance Equipment used: Rolling walker (2 wheels) Transfers: Sit to/from Stand Sit to Stand: Min assist, Min guard           General transfer comment: Pt needing several min and cues to eventually come to stand from recliner, slight minA to keep RW on ground with first sit to stand rep. Min guard with subsequent 2nd rep from bedside commode. Pt needs cues for hand placement, and pt places L leg anterior to R.    Ambulation/Gait Ambulation/Gait assistance: Min guard Gait Distance (Feet): 85 Feet (x2 boutsof ~85 ft > ~25 ft) Assistive device: Rolling walker (2 wheels) Gait Pattern/deviations: Step-to pattern, Decreased stance time - left, Decreased dorsiflexion - left, Decreased weight shift to left, Decreased stride length, Trunk flexed Gait velocity: reduced Gait velocity interpretation: <1.31 ft/sec, indicative of household ambulator   General Gait Details: Pt with slow, but mostly steady antalgic gait pattern secondary to L leg pain and ankle dorsiflexion and knee flexion stiffness. Cues provided for heel strike and heel-to-toe pattern with good progress noted as distance progressed. No LOB, min guard for safety.  Stairs            Wheelchair Mobility    Modified Rankin (Stroke Patients Only)       Balance Overall balance assessment: Needs assistance Sitting-balance support: No upper extremity supported, Feet supported Sitting balance-Leahy Scale: Good  Standing balance support: No upper extremity supported, Bilateral upper extremity supported, During functional activity Standing balance-Leahy Scale: Fair Standing balance comment: Able to perform tasks  at sink without UE support. Reliant on RW for mobility                             Pertinent Vitals/Pain Pain Assessment Pain Assessment: Faces Faces Pain Scale: Hurts even more Pain Location: L leg Pain Descriptors / Indicators: Discomfort, Grimacing, Operative site guarding, Burning Pain Intervention(s): Limited activity within patient's tolerance, Monitored during session, Repositioned    Home Living Family/patient expects to be discharged to:: Private residence Living Arrangements: Spouse/significant other Available Help at Discharge: Family;Available 24 hours/day Type of Home: House Home Access: Stairs to enter (from garage) Entrance Stairs-Rails: Right Entrance Stairs-Number of Steps: 4   Home Layout: One level Home Equipment: Conservation officer, nature (2 wheels);Shower seat      Prior Function Prior Level of Function : Independent/Modified Independent;Driving;Working/employed (works in administration at work)             Mobility Comments: Does not use AD       Journalist, newspaper   Dominant Hand: Right    Extremity/Trunk Assessment   Upper Extremity Assessment Upper Extremity Assessment: Defer to OT evaluation    Lower Extremity Assessment Lower Extremity Assessment: LLE deficits/detail LLE Deficits / Details: erythema and edema noted around incision site, limiting ankle dorsiflexion AROM slightly but more so knee flexion AROM (achieved ~80 degrees flexion with gravity assisting) LLE Sensation: WNL    Cervical / Trunk Assessment Cervical / Trunk Assessment: Normal  Communication   Communication: No difficulties  Cognition Arousal/Alertness: Awake/alert Behavior During Therapy: Anxious Overall Cognitive Status: Within Functional Limits for tasks assessed                                 General Comments: Difficulty sequencing how to transfer due to pain and anxiety        General Comments General comments (skin integrity, edema, etc.):  SpO2 >/= 84% on RA throughout    Exercises Other Exercises Other Exercises: educated pt on heel slides and knee flexion stretch Other Exercises: educated pt on ankle pumps and ankle dorsiflexion stretch with sheet   Assessment/Plan    PT Assessment Patient needs continued PT services  PT Problem List Decreased range of motion;Decreased activity tolerance;Decreased balance;Decreased mobility;Decreased knowledge of use of DME;Cardiopulmonary status limiting activity;Pain       PT Treatment Interventions Gait training;DME instruction;Stair training;Therapeutic activities;Functional mobility training;Therapeutic exercise;Balance training;Neuromuscular re-education;Patient/family education    PT Goals (Current goals can be found in the Care Plan section)  Acute Rehab PT Goals Patient Stated Goal: to get better and go home PT Goal Formulation: With patient Time For Goal Achievement: 12/03/21 Potential to Achieve Goals: Good    Frequency Min 3X/week     Co-evaluation               AM-PAC PT "6 Clicks" Mobility  Outcome Measure Help needed turning from your back to your side while in a flat bed without using bedrails?: A Little Help needed moving from lying on your back to sitting on the side of a flat bed without using bedrails?: A Little Help needed moving to and from a bed to a chair (including a wheelchair)?: A Little Help needed standing up from a chair using your arms (e.g., wheelchair or  bedside chair)?: A Little Help needed to walk in hospital room?: A Little Help needed climbing 3-5 steps with a railing? : A Little 6 Click Score: 18    End of Session   Activity Tolerance: Patient tolerated treatment well Patient left: in chair;with call bell/phone within reach Nurse Communication: Mobility status;Other (comment) (sats) PT Visit Diagnosis: Unsteadiness on feet (R26.81);Other abnormalities of gait and mobility (R26.89);Difficulty in walking, not elsewhere classified  (R26.2);Pain Pain - Right/Left: Left Pain - part of body: Leg    Time: 1459-1539 PT Time Calculation (min) (ACUTE ONLY): 40 min   Charges:   PT Evaluation $PT Eval Moderate Complexity: 1 Mod PT Treatments $Gait Training: 8-22 mins $Therapeutic Activity: 8-22 mins        Moishe Spice, PT, DPT Acute Rehabilitation Services  Pager: [phone] Office: [phone]   Orvan Falconer 11/19/2021, 3:53 PM

## 2021-11-19 NOTE — Progress Notes (Addendum)
?  Progress Note ? ? ? ?11/19/2021 ?7:46 AM ?1 Day Post-Op ? ?Subjective:  No complaints ? ? ?Vitals:  ? 11/19/21 0300 11/19/21 0726  ?BP: (!) 157/50 (!) 140/48  ?Pulse: 72 73  ?Resp: 17 18  ?Temp: 100.1 ?F (37.8 ?C) 99 ?F (37.2 ?C)  ?SpO2: 94% 94%  ? ?Physical Exam: ?Lungs:  non labored ?Incisions:  L LE incisions c/d/i ?Extremities:  brisk L DP and PT signal by doppler ?Neurologic: A&O ? ?CBC ?   ?Component Value Date/Time  ? WBC 13.0 (H) 11/19/2021 0154  ? RBC 3.80 (L) 11/19/2021 0154  ? HGB 12.6 11/19/2021 0154  ? HCT 38.6 11/19/2021 0154  ? PLT 225 11/19/2021 0154  ? MCV 101.6 (H) 11/19/2021 0154  ? MCH 33.2 11/19/2021 0154  ? MCHC 32.6 11/19/2021 0154  ? RDW 12.1 11/19/2021 0154  ? LYMPHSABS 3,006 11/10/2021 1539  ? MONOABS 0.7 06/06/2018 1342  ? EOSABS 180 11/10/2021 1539  ? BASOSABS 63 11/10/2021 1539  ? ? ?BMET ?   ?Component Value Date/Time  ? NA 137 11/19/2021 0154  ? K 4.7 11/19/2021 0154  ? CL 103 11/19/2021 0154  ? CO2 27 11/19/2021 0154  ? GLUCOSE 165 (H) 11/19/2021 0154  ? BUN 28 (H) 11/19/2021 0154  ? CREATININE 1.80 (H) 11/19/2021 0154  ? CREATININE 1.79 (H) 11/10/2021 1539  ? CALCIUM 8.2 (L) 11/19/2021 0154  ? GFRNONAA 29 (L) 11/19/2021 0154  ? GFRNONAA 28 (L) 12/15/2020 1137  ? GFRAA 32 (L) 12/15/2020 1137  ? ? ?INR ?   ?Component Value Date/Time  ? INR 0.9 11/17/2021 1342  ? ? ? ?Intake/Output Summary (Last 24 hours) at 11/19/2021 0746 ?Last data filed at 11/19/2021 0400 ?Gross per 24 hour  ?Intake 2095 ml  ?Output 505 ml  ?Net 1590 ml  ? ? ? ?Assessment/Plan:  76 y.o. female is s/p L femoral to BK popliteal with PTFE due to left ulcer 1 Day Post-Op  ? ?L foot well perfused with brisk DP and PT signals ?L ankle wound can be open to air to allow for it to dry ?OOB today with therapy ?Home when mobility improved and pain controlled ? ? ?Dagoberto Ligas, PA-C ?[facility] ?[phone] ?11/19/2021 ?7:46 AM ? ?I agree with the above ?Brisk pedal doppler signals, incisions clean and  dry ?Continue ASA and statin ?Will need PO abx for ulcer when perioperative abx expire ?OOB walking today ? ?[name] ? ?

## 2021-11-19 NOTE — Progress Notes (Signed)
PHARMACIST LIPID MONITORING ? ? ?[name] is a 76 y.o. female now s/p L femoral to BK popliteal with PTFE due to left ulcer.  Pharmacy has been consulted to optimize lipid-lowering therapy with the indication of secondary prevention for clinical ASCVD. ? ?Recent Labs: ? ?Lipid Panel (last 6 months):   ?Lab Results  ?Component Value Date  ? CHOL 159 11/19/2021  ? TRIG 127 11/19/2021  ? HDL 32 (L) 11/19/2021  ? CHOLHDL 5.0 11/19/2021  ? VLDL 25 11/19/2021  ? LDLCALC 102 (H) 11/19/2021  ? ? ?Hepatic function panel (last 6 months):   ?Lab Results  ?Component Value Date  ? AST 37 11/18/2021  ? ALT 18 11/18/2021  ? ALKPHOS 75 11/18/2021  ? BILITOT 1.3 (H) 11/18/2021  ? ? ?SCr (since admission):   ?Serum creatinine: 1.8 mg/dL (H) 11/19/21 0154 ?Estimated creatinine clearance: 29.4 mL/min (A) ? ?Current therapy and lipid therapy tolerance ?Current lipid-lowering therapy: Prescribed Crestor PTA but patient not taking due to worries about her kidney function. ?Previous lipid-lowering therapies (if applicable): Crestor ?Documented or reported allergies or intolerances to lipid-lowering therapies (if applicable): none ? ?Assessment:   ?Patient agrees with changes to lipid-lowering therapy ? ?Plan:   ? ?1.Statin intensity (high intensity recommended for all patients regardless of the LDL):  Add or increase statin to high intensity. > change Crestor to Lipitor due to concerns for renal function. ? ?2.Add ezetimibe (if any one of the following):   Not indicated at this time. ? ?3.Refer to lipid clinic:   No ? ?4.Follow-up with:  Primary care provider - [name], MD ? ?5.Follow-up labs after discharge:  Changes in lipid therapy were made. Check a lipid panel in 8-12 weeks then annually.    ? ?[place] Crane, Pharm D, BCPS, BCCP ?Clinical Pharmacist ? 11/19/2021 1:50 PM  ? ?[facility] pharmacy phone numbers are listed on [email or web address] ? ? ?

## 2021-11-19 NOTE — Anesthesia Postprocedure Evaluation (Signed)
Anesthesia Post Note ? ?Patient: [name] ? ?Procedure(s) Performed: LEFT FEMORAL-POPLITEAL ARTERY BYPASS GRAFT (Left: Leg Upper) ? ?  ? ?Patient location during evaluation: PACU ?Anesthesia Type: General ?Level of consciousness: awake and alert ?Pain management: pain level controlled ?Vital Signs Assessment: post-procedure vital signs reviewed and stable ?Respiratory status: spontaneous breathing, nonlabored ventilation, respiratory function stable and patient connected to nasal cannula oxygen ?Cardiovascular status: blood pressure returned to baseline and stable ?Postop Assessment: no apparent nausea or vomiting ?Anesthetic complications: no ? ? ?No notable events documented. ? ?Last Vitals:  ?Vitals:  ? 11/19/21 0055 11/19/21 0300  ?BP: (!) 132/47 (!) 157/50  ?Pulse: 73 72  ?Resp: 16 17  ?Temp: 37.7 ?C 37.8 ?C  ?SpO2: 95% 94%  ?  ?Last Pain:  ?Vitals:  ? 11/19/21 0300  ?TempSrc: Oral  ?PainSc:   ? ? ?  ?  ?  ?  ?  ?  ? ?Merlinda Frederick ? ? ? ? ?

## 2021-11-19 NOTE — Consult Note (Addendum)
WOC Nurse Consult Note: ?Reason for Consult: Consult requested for left leg wound.  Pt is in isolation for Covid and has been followed by Dr Sharol Given of the ortho team prior to admission and Aquacel has been ordered every day for topical treatment.  ?Wound type: Left anterior lower calf with chronic full thickness wound; 2X1X.3cm, 80% yellow, 20% red, mod amt tan drainage, no odor or fluctuance.  ?Dressing procedure/placement/frequency:  ?Topical treatment orders provided for bedside nurses to perform as follows to absorb drainage and provide antimicrobial benefits: Apply a piece of Aquacel Kellie Simmering # [phone]) to left leg wound Q day, using swab to fill, then cover with foam dressing and ace wrap. Moisten with NS each time to assist with removal.   ?Pt should follow-up with Dr Sharol Given after discharge. ?Please re-consult if further assistance is needed.  Thank-you,  ?Julien Girt MSN, RN, [place], [place], CNS ?[phone]  ? ?  ?

## 2021-11-19 NOTE — Evaluation (Signed)
Occupational Therapy Evaluation ?Patient Details ?Name: [name] ?MRN: [id number] ?DOB: [birth year] ?Today's Date: 11/19/2021 ? ? ?History of Present Illness 76 y.o. female with arterial insufficiencey with ischemic ulcer LLE.  s/p L femoral to BK popliteal with PTFE 3/1. PMH: Emphasema, anxiety, neuropathy, PVD, DM, L CVA withy residual RU/LE numbness, CKD, HTN.  ? ?Clinical Impression ?  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 Appears anxious at times during session, however able to ambulate @ 40 ft and complete grooming tasks standing at sink @ RW level. Requires Min A with LB ADL tasks. Given PMH of CVA, emphasema and neuropathy, recommend HHOT to maximize functional level of independence and reduce risk of falls after DC. Acute OT to follow.  ?SPO2 88 on RA after mobility. Placed on 2L and SpO2 95. Nsg made aware.  ?   ? ?Recommendations for follow up therapy are one component of a multi-disciplinary discharge planning process, led by the attending physician.  Recommendations may be updated based on patient status, additional functional criteria and insurance authorization.  ? ?Follow Up Recommendations ? Home health OT  ?  ?Assistance Recommended at Discharge Intermittent Supervision/Assistance  ?Patient can return home with the following A little help with walking and/or transfers;A little help with bathing/dressing/bathroom;Assistance with cooking/housework;Assist for transportation;Help with stairs or ramp for entrance ? ?  ?Functional Status Assessment ? Patient has had a recent decline in their functional status and demonstrates the ability to make significant improvements in function in a reasonable and predictable amount of time.  ?Equipment Recommendations ? None recommended by OT  ?  ?Recommendations for Other Services PT consult ? ? ?  ?Precautions / Restrictions Precautions ?Precautions: Fall ?Restrictions ?Weight Bearing Restrictions: No  ? ?   ? ?Mobility Bed Mobility ?  ?  ?  ?  ?  ?  ?  ?General bed mobility comments: OOB in chair ?  ? ?Transfers ?Overall transfer level: Needs assistance ?Equipment used: Rolling walker (2 wheels) ?Transfers: Sit to/from Stand ?Sit to Stand: Min assist ?  ?  ?  ?  ?  ?  ?  ? ?  ?Balance Overall balance assessment: Needs assistance ?  ?Sitting balance-Leahy Scale: Good ?  ?  ?  ?Standing balance-Leahy Scale: Fair ?Standing balance comment: ablet o release RW at sink and complete grooming tasks ?  ?  ?  ?  ?  ?  ?  ?  ?  ?  ?  ?   ? ?ADL either performed or assessed with clinical judgement  ? ?ADL Overall ADL's : Needs assistance/impaired ?  ?  ?Grooming: Min guard;Standing ?  ?Upper Body Bathing: Set up ?  ?Lower Body Bathing: Minimal assistance ?  ?Upper Body Dressing : Set up ?  ?Lower Body Dressing: Minimal assistance ?  ?Toilet Transfer: Min guard;Ambulation;Rolling walker (2 wheels) ?  ?Toileting- Water quality scientist and Hygiene: Min guard ?  ?  ?  ?Functional mobility during ADLs: Min guard;Cueing for safety;Rolling walker (2 wheels) ?General ADL Comments: unable to reach her feet or achieve figure four position due to pain/"tigthtness"  ? ? ? ?Vision Baseline Vision/History: 0 No visual deficits ?   ?   ?Perception   ?  ?Praxis   ?  ? ?Pertinent Vitals/Pain Pain Assessment ?Pain Assessment: No/denies pain  ? ? ? ?Hand Dominance Right ?  ?Extremity/Trunk Assessment Upper Extremity Assessment ?Upper Extremity Assessment: Overall WFL for tasks assessed ?  ?Lower Extremity Assessment ?  Lower Extremity Assessment: Defer to PT evaluation ?  ?Cervical / Trunk Assessment ?Cervical / Trunk Assessment: Normal ?  ?Communication Communication ?Communication: No difficulties ?  ?Cognition Arousal/Alertness: Awake/alert ?Behavior During Therapy: Anxious ?Overall Cognitive Status: Within Functional Limits for tasks assessed ?  ?  ?  ?  ?  ?  ?  ?  ?  ?  ?  ?  ?  ?  ?  ?  ?General Comments: groggy from pain meds, butmost likely  baseline ?  ?  ?General Comments  SpO2 88 on RA after ambulation; placed on 2L and SpO2 increased to 95 ? ?  ?Exercises Exercises: Other exercises (encouraged ankle pumps and general LE ROM as tolerated) ?  ?Shoulder Instructions    ? ? ?Home Living Family/patient expects to be discharged to:: Private residence ?Living Arrangements: Spouse/significant other ?Available Help at Discharge: Family;Available 24 hours/day ?Type of Home: House ?Home Access: Stairs to enter (from garage) ?Entrance Stairs-Number of Steps: 4 ?Entrance Stairs-Rails: Right ?Home Layout: One level ?  ?  ?Bathroom Shower/Tub: Walk-in shower ?  ?Bathroom Toilet: Handicapped height ?Bathroom Accessibility: Yes ?How Accessible: Accessible via walker ?Home Equipment: Conservation officer, nature (2 wheels);Shower seat ?  ?  ?  ? ?  ?Prior Functioning/Environment Prior Level of Function : Independent/Modified Independent;Driving;Working/employed (works in administration at work) ?  ?  ?  ?  ?  ?  ?  ?  ?  ? ?  ?  ?OT Problem List: Decreased strength;Decreased range of motion;Decreased activity tolerance;Impaired balance (sitting and/or standing);Decreased safety awareness;Decreased knowledge of use of DME or AE;Cardiopulmonary status limiting activity;Impaired sensation ?  ?   ?OT Treatment/Interventions: Self-care/ADL training;Therapeutic exercise;Energy conservation;DME and/or AE instruction;Therapeutic activities;Patient/family education;Balance training  ?  ?OT Goals(Current goals can be found in the care plan section) Acute Rehab OT Goals ?Patient Stated Goal: to be able to care for herself and go back to work ?OT Goal Formulation: With patient ?Time For Goal Achievement: 12/03/21 ?Potential to Achieve Goals: Good  ?OT Frequency: Min 2X/week ?  ? ?Co-evaluation   ?  ?  ?  ?  ? ?  ?AM-PAC OT "6 Clicks" Daily Activity     ?Outcome Measure Help from another person eating meals?: None ?Help from another person taking care of personal grooming?: A Little ?Help from  another person toileting, which includes using toliet, bedpan, or urinal?: A Little ?Help from another person bathing (including washing, rinsing, drying)?: A Little ?Help from another person to put on and taking off regular upper body clothing?: A Little ?Help from another person to put on and taking off regular lower body clothing?: A Little ?6 Click Score: 19 ?  ?End of Session Equipment Utilized During Treatment: Gait belt;Rolling walker (2 wheels) ?Nurse Communication: Mobility status ? ?Activity Tolerance: Patient tolerated treatment well ?Patient left: in chair;with call bell/phone within reach ? ?OT Visit Diagnosis: Unsteadiness on feet (R26.81);Other abnormalities of gait and mobility (R26.89)  ?              ?Time: 8676-7209 ?OT Time Calculation (min): 43 min ?Charges:  OT General Charges ?$OT Visit: 1 Visit ?OT Evaluation ?$OT Eval Moderate Complexity: 1 Mod ?OT Treatments ?$Self Care/Home Management : 23-37 mins ? ?[facility], OT/L  ? ?Acute OT Clinical Specialist ?Acute Rehabilitation Services ?Pager [phone] ?Office [phone]  ? ?Zyana Amaro,HILLARY ?11/19/2021, 10:30 AM ?

## 2021-11-20 LAB — BASIC METABOLIC PANEL
Anion gap: 9 (ref 5–15)
BUN: 33 mg/dL — ABNORMAL HIGH (ref 8–23)
CO2: 27 mmol/L (ref 22–32)
Calcium: 8.5 mg/dL — ABNORMAL LOW (ref 8.9–10.3)
Chloride: 101 mmol/L (ref 98–111)
Creatinine, Ser: 1.98 mg/dL — ABNORMAL HIGH (ref 0.44–1.00)
GFR, Estimated: 26 mL/min — ABNORMAL LOW (ref >60)
Glucose, Bld: 156 mg/dL — ABNORMAL HIGH (ref 70–99)
Potassium: 4.2 mmol/L (ref 3.5–5.1)
Sodium: 137 mmol/L (ref 135–145)

## 2021-11-20 LAB — GLUCOSE, CAPILLARY
Glucose-Capillary: 173 mg/dL — ABNORMAL HIGH (ref 70–99)
Glucose-Capillary: 182 mg/dL — ABNORMAL HIGH (ref 70–99)
Glucose-Capillary: 172 mg/dL — ABNORMAL HIGH (ref 70–99)
Glucose-Capillary: 275 mg/dL — ABNORMAL HIGH (ref 70–99)

## 2021-11-20 NOTE — Progress Notes (Signed)
Physical Therapy Treatment ?Patient Details ?Name: [name] ?MRN: [id number] ?DOB: 19-Sep-1946 ?Today's Date: 11/20/2021 ? ? ?History of Present Illness 76 y.o. female presented 11/16/21 with arterial insufficiencey with ischemic ulcer LLE.  s/p L femoral to BK popliteal with PTFE 3/1. PMH: Emphasema, anxiety, neuropathy, PVD, DM, L CVA withy residual RU/LE numbness, CKD, HTN. ? ?  ?PT Comments  ? ? Pt received in supine, agreeable to therapy session with goal of stair negotiation and gait progression. Pt dyspneic with short distance gait/stair trial in room, recovered after brief seated break. Pt gait progression limited due to fatigue and urinary urgency and left in care of mobility specialist at end of session for assist to/from toilet. Emphasis on step sequencing and transfer techniques per home setup, pt needing min to modA and anxious for sit>stand but reports she uses a lift chair at home so does not have concerns about this once she is home. Pt continues to benefit from PT services to progress toward functional mobility goals.   ?Recommendations for follow up therapy are one component of a multi-disciplinary discharge planning process, led by the attending physician.  Recommendations may be updated based on patient status, additional functional criteria and insurance authorization. ? ?Follow Up Recommendations ? Home health PT ?  ?  ?Assistance Recommended at Discharge Intermittent Supervision/Assistance  ?Patient can return home with the following A little help with walking and/or transfers;A little help with bathing/dressing/bathroom;Assistance with cooking/housework;Help with stairs or ramp for entrance;Assist for transportation ?  ?Equipment Recommendations ? None recommended by PT  ?  ?Recommendations for Other Services   ? ? ?  ?Precautions / Restrictions Precautions ?Precautions: Fall ?Precaution Comments: watch SpO2  ?  ? ?Mobility ? Bed Mobility ?Overal bed mobility: Modified Independent ?  ?  ?  ?   ?  ?  ?General bed mobility comments: increased time/effort and very reliant on bed rail to sit up; encouraged her to attempt without rail but pt defers ?  ? ?Transfers ?Overall transfer level: Needs assistance ?Equipment used: Rolling walker (2 wheels) ?Transfers: Sit to/from Stand ?Sit to Stand: Mod assist, Min assist ?  ?  ?  ?  ?  ?General transfer comment: Pt needing several min and cues to eventually come to stand from EOB, modA to keep RW on ground and for lift assist when not using bed rail (pt does not have railing on bed per home setup). Pt needs cues for hand placement, and pt places L leg anterior to R and c/o groin pain. ?  ? ?Ambulation/Gait ?Ambulation/Gait assistance: Min guard ?Gait Distance (Feet): 25 Feet ?Assistive device: Rolling walker (2 wheels) ?Gait Pattern/deviations: Step-to pattern, Decreased stance time - left, Decreased dorsiflexion - left, Decreased weight shift to left, Decreased stride length, Trunk flexed ?Gait velocity: reduced ?  ?  ?General Gait Details: Pt with slow, but mostly steady antalgic gait pattern secondary to L leg pain and ankle dorsiflexion and knee flexion stiffness. DOE 2-3/4, poor waveform with O2 sensor with exertion, RN notified. DOE improves with seated break ? ? ?Stairs ?Stairs: Yes ?Stairs assistance: Min guard, +2 safety/equipment ?Stair Management: Two rails, Step to pattern, Forwards ?Number of Stairs: 6 ?General stair comments: cues for step sequencing, pt using RW to simulate B handrails at home, pt reports she can go in entrance with 1 or 2 rails, daughter present and receptive to instruction on guarding positions/use of gait belt ? ? ?Wheelchair Mobility ?  ? ?Modified Rankin (Stroke Patients Only) ?  ? ? ?  ?  Balance Overall balance assessment: Mild deficits observed, not formally tested ?  ?  ?  ?  ?  ?  ?  ?  ?  ?  ?  ?  ?  ?  ?  ?  ?  ?  ?  ? ?  ?Cognition Arousal/Alertness: Awake/alert ?Behavior During Therapy: [facility] for tasks  assessed/performed ?Overall Cognitive Status: Within Functional Limits for tasks assessed ?  ?  ?  ?  ?  ?  ?  ?  ?  ?  ?  ?  ?  ?  ?  ?  ?  ?  ?  ? ?  ?Exercises   ? ?  ?General Comments General comments (skin integrity, edema, etc.): VSS at rest, pt reports 0/10 pain then grimacing and c/o groin discomfort with transfer; somewhat contradictory. DOE. ?  ?  ? ?Pertinent Vitals/Pain Pain Assessment ?Pain Assessment: Faces ?Faces Pain Scale: Hurts little more ?Pain Location: L groin with transfers ?Pain Descriptors / Indicators: Grimacing ?Pain Intervention(s): Monitored during session, Repositioned  ? ? ?Home Living   ?  ?  ?  ?  ?  ?  ?  ?  ?  ?   ?  ?Prior Function    ?  ?  ?   ? ?PT Goals (current goals can now be found in the care plan section) Acute Rehab PT Goals ?PT Goal Formulation: With patient ?Time For Goal Achievement: 12/03/21 ?Progress towards PT goals: Progressing toward goals ? ?  ?Frequency ? ? ? Min 3X/week ? ? ? ?  ?PT Plan Current plan remains appropriate  ? ? ?Co-evaluation   ?  ?  ?  ?  ? ?  ?AM-PAC PT "6 Clicks" Mobility   ?Outcome Measure ? Help needed turning from your back to your side while in a flat bed without using bedrails?: None ?Help needed moving from lying on your back to sitting on the side of a flat bed without using bedrails?: A Little ?Help needed moving to and from a bed to a chair (including a wheelchair)?: A Little ?Help needed standing up from a chair using your arms (e.g., wheelchair or bedside chair)?: A Lot (mod cues) ?Help needed to walk in hospital room?: A Little ?Help needed climbing 3-5 steps with a railing? : A Little ?6 Click Score: 18 ? ?  ?End of Session Equipment Utilized During Treatment: Gait belt ?Activity Tolerance: Patient tolerated treatment well;Other (comment) (frustrated and wanting to go home) ?Patient left: in bed;with call bell/phone within reach;with family/visitor present;Other (comment) (in care of mobility tech preparing to walk to  bathroom) ?Nurse Communication: Mobility status;Other (comment) (DOE, may need new finger sensor) ?PT Visit Diagnosis: Unsteadiness on feet (R26.81);Other abnormalities of gait and mobility (R26.89);Difficulty in walking, not elsewhere classified (R26.2);Pain ?Pain - Right/Left: Left ?Pain - part of body: Leg ?  ? ? ?Time: 3235-5732 ?PT Time Calculation (min) (ACUTE ONLY): 23 min ? ?Charges:  $Gait Training: 8-22 mins ?$Therapeutic Activity: 8-22 mins          ?          ? ?Kanoelani Dobies P., PTA ?Acute Rehabilitation Services ?Pager: [phone] ?Office: [phone]  ? ? ?Kara Pacer Raylen Ken ?11/20/2021, 4:11 PM ? ?

## 2021-11-20 NOTE — Progress Notes (Addendum)
?  Progress Note ? ? ? ?11/20/2021 ?7:38 AM ?2 Days Post-Op ? ?Subjective:  no new complaints ? ? ?Vitals:  ? 11/20/21 0600 11/20/21 0633  ?BP: (!) 151/51 (!) 151/51  ?Pulse: 70   ?Resp: 20   ?Temp: 99.4 ?F (37.4 ?C)   ?SpO2: 96%   ? ?Physical Exam: ?Lungs:  non labored ?Incisions:  L groin and all incisions of LLE healing well ?Extremities:  brisk multiphasic DP signal; monophasic L PT by doppler ?Neurologic: A&O ? ?CBC ?   ?Component Value Date/Time  ? WBC 13.0 (H) 11/19/2021 0154  ? RBC 3.80 (L) 11/19/2021 0154  ? HGB 12.6 11/19/2021 0154  ? HCT 38.6 11/19/2021 0154  ? PLT 225 11/19/2021 0154  ? MCV 101.6 (H) 11/19/2021 0154  ? MCH 33.2 11/19/2021 0154  ? MCHC 32.6 11/19/2021 0154  ? RDW 12.1 11/19/2021 0154  ? LYMPHSABS 3,006 11/10/2021 1539  ? MONOABS 0.7 06/06/2018 1342  ? EOSABS 180 11/10/2021 1539  ? BASOSABS 63 11/10/2021 1539  ? ? ?BMET ?   ?Component Value Date/Time  ? NA 137 11/20/2021 0149  ? K 4.2 11/20/2021 0149  ? CL 101 11/20/2021 0149  ? CO2 27 11/20/2021 0149  ? GLUCOSE 156 (H) 11/20/2021 0149  ? BUN 33 (H) 11/20/2021 0149  ? CREATININE 1.98 (H) 11/20/2021 0149  ? CREATININE 1.79 (H) 11/10/2021 1539  ? CALCIUM 8.5 (L) 11/20/2021 0149  ? GFRNONAA 26 (L) 11/20/2021 0149  ? GFRNONAA 28 (L) 12/15/2020 1137  ? GFRAA 32 (L) 12/15/2020 1137  ? ? ?INR ?   ?Component Value Date/Time  ? INR 0.9 11/17/2021 1342  ? ? ? ?Intake/Output Summary (Last 24 hours) at 11/20/2021 0738 ?Last data filed at 11/20/2021 6195 ?Gross per 24 hour  ?Intake 940 ml  ?Output 850 ml  ?Net 90 ml  ? ? ? ?Assessment/Plan:  76 y.o. female is s/p L femoral to popliteal bypass with vein for L ankle ulcer 2 Days Post-Op  ? ?L foot well perfused with DP and PT signals by doppler ?Incisions appear to be healing well ?Therapy recommending [place] PT ?Possible discharge home this afternoon ? ? ? ?Dagoberto Ligas, PA-C ?[facility] ?[phone] ?11/20/2021 ?7:38 AM ? ?I agree with the above.  I have seen and evaluated the patient.  She  is status post left femoral to below-knee popliteal bypass graft with vein.  She has a multiphasic posterior tibial signal and peroneal signal.  She is going to spend the night tonight and plan for discharge tomorrow.  She should go home with 2 weeks of Bactrim.  Her wound is being followed by Dr. Sharol Given.  I can see her back in the office for incision check in 2 to 3 weeks. ? ?[name] ? ?

## 2021-11-20 NOTE — Progress Notes (Signed)
Inpatient Diabetes Program Recommendations ? ?AACE/ADA: New Consensus Statement on Inpatient Glycemic Control (2015) ? ?Target Ranges:  Prepandial:   less than 140 mg/dL ?     Peak postprandial:   less than 180 mg/dL (1-2 hours) ?     Critically ill patients:  140 - 180 mg/dL  ? ?Lab Results  ?Component Value Date  ? GLUCAP 182 (H) 11/20/2021  ? HGBA1C 7.1 (H) 11/17/2021  ? ? ?Review of Glycemic Control ? Latest Reference Range & Units 11/18/21 21:10 11/19/21 06:35 11/19/21 11:27 11/19/21 17:13 11/19/21 21:31 11/20/21 06:28 11/20/21 11:56  ?Glucose-Capillary 70 - 99 mg/dL 112 (H) 165 (H) 242 (H) 312 (H) 156 (H) 173 (H) 182 (H)  ? ?Diabetes history: DM 2 ?Outpatient Diabetes medications:  ?Novolog 10-14 units tid with meals ?Tresiba 34 units daily ?Current orders for Inpatient glycemic control:  ?Novolog moderate tid with meals ? ?Inpatient Diabetes Program Recommendations:   ? ?May consider adding Semglee 15 units daily (this is less than 1/2 of home basal dose of insulin) and Novolog 3 units tid with meals (hold if patient eats less than 50% or NPO).  ? ?Thanks,  ?Adah Perl, RN, BC-ADM ?Inpatient Diabetes Coordinator ?Pager [phone]  (8a-5p) ? ? ?

## 2021-11-20 NOTE — Progress Notes (Signed)
Mobility Specialist Progress Note ? ? 11/20/21 1400  ?Mobility  ?Activity Ambulated with assistance in room  ?Level of Assistance Contact guard assist, steadying assist  ?Assistive Device Front wheel walker  ?Distance Ambulated (ft) 100 ft  ?Activity Response Tolerated well  ?$Mobility charge 1 Mobility  ? ?Following PT session, pt inclined to use the BR and ambulate in room. Successful void w/ no needed physical assist ascending and descending from bathroom. While ambulating pt c/o slight stinging pain in leg but no fault during session. Left on EOB w/ needs met and daughter in room. ? ?Holland Falling ?Mobility Specialist ?Phone Number [phone] ? ?

## 2021-11-20 NOTE — Progress Notes (Signed)
Occupational Therapy Treatment ?Patient Details ?Name: [name] ?MRN: [id number] ?DOB: 11/22/1945 ?Today's Date: 11/20/2021 ? ? ?History of present illness 76 y.o. female presented 11/16/21 with arterial insufficiencey with ischemic ulcer LLE.  s/p L femoral to BK popliteal with PTFE 3/1. PMH: Emphasema, anxiety, neuropathy, PVD, DM, L CVA withy residual RU/LE numbness, CKD, HTN. ?  ?OT comments ? Pt. Seen for skilled OT treatment session.  Able to complete toileting task, standing grooming tasks and in room ambulation for back to bed.  Initiating rest breaks appropriately.  Maintaining 87%-96% on RA during all mobility. Agree with current d/c recommendations.  Pt. Eager for home when able. Reports good family support at home.    ? ?Recommendations for follow up therapy are one component of a multi-disciplinary discharge planning process, led by the attending physician.  Recommendations may be updated based on patient status, additional functional criteria and insurance authorization. ?   ?Follow Up Recommendations ? Home health OT  ?  ?Assistance Recommended at Discharge Intermittent Supervision/Assistance  ?Patient can return home with the following ? A little help with walking and/or transfers;A little help with bathing/dressing/bathroom;Assistance with cooking/housework;Assist for transportation;Help with stairs or ramp for entrance ?  ?Equipment Recommendations ? None recommended by OT  ?  ?Recommendations for Other Services   ? ?  ?Precautions / Restrictions Precautions ?Precautions: Fall ?Precaution Comments: watch SpO2  ? ? ?  ? ?Mobility Bed Mobility ?Overal bed mobility: Modified Independent ?  ?  ?  ?  ?  ?  ?  ?  ? ?Transfers ?Overall transfer level: Needs assistance ?Equipment used: Rolling walker (2 wheels) ?Transfers: Sit to/from Stand ?Sit to Stand: Min guard ?  ?  ?  ?  ?  ?  ?  ?  ?Balance   ?  ?  ?  ?  ?  ?  ?  ?  ?  ?  ?  ?  ?  ?  ?  ?  ?  ?  ?   ? ?ADL either performed or assessed with clinical  judgement  ? ?ADL Overall ADL's : Needs assistance/impaired ?  ?  ?Grooming: Min guard;Standing ?  ?  ?  ?  ?  ?  ?  ?  ?  ?Toilet Transfer: Min guard;Ambulation;Rolling walker (2 wheels) ?  ?Toileting- Clothing Manipulation and Hygiene: Min guard;Sitting/lateral lean ?  ?  ?  ?Functional mobility during ADLs: Min guard;Cueing for safety;Rolling walker (2 wheels) ?  ?  ? ?Extremity/Trunk Assessment   ?  ?  ?  ?  ?  ? ?Vision   ?  ?  ?Perception   ?  ?Praxis   ?  ? ?Cognition Arousal/Alertness: Awake/alert ?Behavior During Therapy: [facility] for tasks assessed/performed ?Overall Cognitive Status: Within Functional Limits for tasks assessed ?  ?  ?  ?  ?  ?  ?  ?  ?  ?  ?  ?  ?  ?  ?  ?  ?  ?  ?  ?   ?Exercises   ? ?  ?Shoulder Instructions   ? ? ?  ?General Comments    ? ? ?Pertinent Vitals/ Pain       Pain Assessment ?Pain Assessment: No/denies pain ? ?Home Living   ?  ?  ?  ?  ?  ?  ?  ?  ?  ?  ?  ?  ?  ?  ?  ?  ?  ?  ? ?  ?  Prior Functioning/Environment    ?  ?  ?  ?   ? ?Frequency ? Min 2X/week  ? ? ? ? ?  ?Progress Toward Goals ? ?OT Goals(current goals can now be found in the care plan section) ? Progress towards OT goals: Progressing toward goals ? ?   ?Plan     ? ?Co-evaluation ? ? ?   ?  ?  ?  ?  ? ?  ?AM-PAC OT "6 Clicks" Daily Activity     ?Outcome Measure ? ? Help from another person eating meals?: None ?Help from another person taking care of personal grooming?: A Little ?Help from another person toileting, which includes using toliet, bedpan, or urinal?: A Little ?Help from another person bathing (including washing, rinsing, drying)?: A Little ?Help from another person to put on and taking off regular upper body clothing?: A Little ?Help from another person to put on and taking off regular lower body clothing?: A Little ?6 Click Score: 19 ? ?  ?End of Session Equipment Utilized During Treatment: Gait belt;Rolling walker (2 wheels) ? ?OT Visit Diagnosis: Unsteadiness on feet (R26.81);Other abnormalities of gait  and mobility (R26.89) ?  ?Activity Tolerance Patient tolerated treatment well ?  ?Patient Left in bed;with call bell/phone within reach ?  ?Nurse Communication Other (comment) (reported to rn pt. at 94% after toileting task on RA. steady at 96% lowest 87% during bed mobility and repositioning. pt. requests trial on RA, rn verbalizes ok to leave on RA.) ?  ? ?   ? ?Time: 0223-3612 ?OT Time Calculation (min): 19 min ? ?Charges: OT General Charges ?$OT Visit: 1 Visit ?OT Treatments ?$Self Care/Home Management : 8-22 mins ? ? ? ?Tanya Nones ?11/20/2021, 1:07 PM ?

## 2021-11-20 NOTE — TOC Transition Note (Addendum)
Transition of Care (TOC) - CM/SW Discharge Note ?Marvetta Gibbons Therapist, sports, BSN ?Transitions of Care ?Unit 4E- RN Case Manager ?See Treatment Team for direct phone #  ? ? ?Patient Details  ?Name: [name] ?MRN: [id number] ?Date of Birth: 12/15/1945 ? ?Transition of Care (TOC) CM/SW Contact:  ?Dahlia Client, Romeo Rabon, RN ?Phone Number: ?11/20/2021, 3:04 PM ? ? ?Clinical Narrative:    ?CM has been trying to reach patient multiple times throughout day to discuss transition needs via phone due to +COVID without success. Bedside RN at the bedside made call to this writer and CM was able to speak with patient via TC to discuss [place] needs for PT/OT.  ?Discussed with pt orders placed per recommendations for PT/OT- per pt she has had [facility] services in past and is agreeable to [facility] on discharge. Choice offered Per CMS guidelines from medicare.gov website with star ratings (copy placed in shadow chart) - per pt she does not have a preference this time and defers to this writer to secure on her behalf. Discussed with pt using [facility] as they have a working relationship with Vascular office for [facility] protocols - pt agreeable to using [facility].  ? ?Address, phone #s and PCP all confirmed in epic w/ patient.  ? ?Cm spoke with Lattie Haw at [facility] regarding [facility] needs- referral has been accepted for HHPT/OT and they will contact pt for scheduling initial home visit.  ? ?Pt reports she has needed DME. From home w/ spouse ?No further TOC needs noted.  ? ? ? ? ?Final next level of care: [place] ?Barriers to Discharge: No Barriers Identified ? ? ?Patient Goals and CMS Choice ?Patient states their goals for this hospitalization and ongoing recovery are:: return home ?CMS Medicare.gov Compare Post Acute Care list provided to:: Patient ?Choice offered to / list presented to : Patient (per TC due to [place]) ? ?Discharge Placement ?  ?           ?  ? Home w/ HH ?  ?  ? ?Discharge Plan and Services ?  ?Discharge Planning Services: CM Consult ?Post  Acute Care Choice: Home Health          ?  ?  ?  ?  ?  ?HH Arranged: PT, OT ?[place] Agency: [place] ?Date HH Agency Contacted: 11/20/21 ?Time [place]: [phone] ?Representative spoke with at [place]: Lattie Haw ? ?Social Determinants of Health (SDOH) Interventions ?  ? ? ?Readmission Risk Interventions ?Readmission Risk Prevention Plan 11/20/2021  ?Transportation Screening Complete  ?PCP or Specialist Appt within 5-7 Days Complete  ?Home Care Screening Complete  ?Medication Review (RN CM) Complete  ?Some recent data might be hidden  ? ? ? ? ? ?

## 2021-11-20 NOTE — Care Management Important Message (Signed)
Important Message ? ?Patient Details  ?Name: [name] ?MRN: [id number] ?Date of Birth: 07/14/1946 ? ? ?Medicare Important Message Given:  Yes ? ? ? ? ?Shelda Altes ?11/20/2021, 10:41 AM ?

## 2021-11-21 LAB — GLUCOSE, CAPILLARY: Glucose-Capillary: 195 mg/dL — ABNORMAL HIGH (ref 70–99)

## 2021-11-21 MED ORDER — SULFAMETHOXAZOLE-TRIMETHOPRIM 400-80 MG PO TABS: 1.0000 | ORAL_TABLET | Freq: Two times a day (BID) | ORAL | 0 refills | Status: DC

## 2021-11-21 MED ORDER — ATORVASTATIN CALCIUM 80 MG PO TABS
80.0000 mg | ORAL_TABLET | Freq: Every day | ORAL | 2 refills | Status: DC
Start: 2021-11-22 — End: 2022-02-15

## 2021-11-21 MED ORDER — OXYCODONE-ACETAMINOPHEN 5-325 MG PO TABS: 1.0000 | ORAL_TABLET | Freq: Four times a day (QID) | ORAL | 0 refills | Status: DC | PRN

## 2021-11-21 NOTE — Discharge Instructions (Signed)
 [facility]  Discharge instructions  Lower Extremity Bypass Surgery  Please refer to the following instruction for your post-procedure care. Your surgeon or physician assistant will discuss any changes with you.  Activity  You are encouraged to walk as much as you can. You can slowly return to normal activities during the month after your surgery. Avoid strenuous activity and heavy lifting until your doctor tells you it's OK. Avoid activities such as vacuuming or swinging a golf club. Do not drive until your doctor give the OK and you are no longer taking prescription pain medications. It is also normal to have difficulty with sleep habits, eating and bowel movement after surgery. These will go away with time.  Bathing/Showering  You may shower after you go home. Do not soak in a bathtub, hot tub, or swim until the incision heals completely.  Incision Care  Clean your incision with mild soap and water. Shower every day. Pat the area dry with a clean towel. You do not need a bandage unless otherwise instructed. Do not apply any ointments or creams to your incision. If you have open wounds you will be instructed how to care for them or a visiting nurse may be arranged for you. If you have staples or sutures along your incision they will be removed at your post-op appointment. You may have skin glue on your incision. Do not peel it off. It will come off on its own in about one week. If you have a great deal of moisture in your groin, use a gauze help keep this area dry.  Diet  Resume your normal diet. There are no special food restrictions following this procedure. A low fat/ low cholesterol diet is recommended for all patients with vascular disease. In order to heal from your surgery, it is CRITICAL to get adequate nutrition. Your body requires vitamins, minerals, and protein. Vegetables are the best source of vitamins and minerals. Vegetables also provide the  perfect balance of protein. Processed food has little nutritional value, so try to avoid this.  Medications  Resume taking all your medications unless your doctor or nurse practitioner tells you not to. If your incision is causing pain, you may take over-the-counter pain relievers such as acetaminophen (Tylenol). If you were prescribed a stronger pain medication, please aware these medication can cause nausea and constipation. Prevent nausea by taking the medication with a snack or meal. Avoid constipation by drinking plenty of fluids and eating foods with high amount of fiber, such as fruits, vegetables, and grains. Take Colase 100 mg (an over-the-counter stool softener) twice a day as needed for constipation. Do not take Tylenol if you are taking prescription pain medications.  Follow Up  Our office will schedule a follow up appointment 2-3 weeks following discharge.  Please call us immediately for any of the following conditions  Severe or worsening pain in your legs or feet while at rest or while walking Increase pain, redness, warmth, or drainage (pus) from your incision site(s) Fever of 101 degree or higher The swelling in your leg with the bypass suddenly worsens and becomes more painful than when you were in the hospital If you have been instructed to feel your graft pulse then you should do so every day. If you can no longer feel this pulse, call the office immediately. Not all patients are given this instruction.  Leg swelling is common after leg bypass surgery.  The swelling should improve over a few months   following surgery. To improve the swelling, you may elevate your legs above the level of your heart while you are sitting or resting. Your surgeon or physician assistant may ask you to apply an ACE wrap or wear compression (TED) stockings to help to reduce swelling.  Reduce your risk of vascular disease  Stop smoking. If you would like help call [facility] at 1-800-QUIT-NOW  (1-800-784-8669) or [place] at [phone].  Manage your cholesterol Maintain a desired weight Control your diabetes weight Control your diabetes Keep your blood pressure down  If you have any questions, please call the office at [phone]   

## 2021-11-21 NOTE — Progress Notes (Signed)
D/c tele and Ivs. Went over AVS with pt and all questions were answered.  ? ?Deunta Beneke S Desiray Orchard, RN ? ?

## 2021-11-21 NOTE — Progress Notes (Signed)
?  Progress Note ? ? ? ?11/21/2021 ?8:29 AM ?3 Days Post-Op ? ?Subjective: Patient's daughter is present during exam this morning.  They are ready for discharge home. ? ? ?Vitals:  ? 11/21/21 0312 11/21/21 0736  ?BP: (!) 158/50 (!) 151/48  ?Pulse: 71 73  ?Resp: 20 20  ?Temp: 99 ?F (37.2 ?C) 98.9 ?F (37.2 ?C)  ?SpO2: 98% 98%  ? ?Physical Exam: ?Lungs: Nonlabored ?Incisions: All incisions of left leg seem to be healing well ?Extremities: Brisk left DP and PT signal by Doppler ?Neurologic: A&O ? ?CBC ?   ?Component Value Date/Time  ? WBC 13.0 (H) 11/19/2021 0154  ? RBC 3.80 (L) 11/19/2021 0154  ? HGB 12.6 11/19/2021 0154  ? HCT 38.6 11/19/2021 0154  ? PLT 225 11/19/2021 0154  ? MCV 101.6 (H) 11/19/2021 0154  ? MCH 33.2 11/19/2021 0154  ? MCHC 32.6 11/19/2021 0154  ? RDW 12.1 11/19/2021 0154  ? LYMPHSABS 3,006 11/10/2021 1539  ? MONOABS 0.7 06/06/2018 1342  ? EOSABS 180 11/10/2021 1539  ? BASOSABS 63 11/10/2021 1539  ? ? ?BMET ?   ?Component Value Date/Time  ? NA 137 11/20/2021 0149  ? K 4.2 11/20/2021 0149  ? CL 101 11/20/2021 0149  ? CO2 27 11/20/2021 0149  ? GLUCOSE 156 (H) 11/20/2021 0149  ? BUN 33 (H) 11/20/2021 0149  ? CREATININE 1.98 (H) 11/20/2021 0149  ? CREATININE 1.79 (H) 11/10/2021 1539  ? CALCIUM 8.5 (L) 11/20/2021 0149  ? GFRNONAA 26 (L) 11/20/2021 0149  ? GFRNONAA 28 (L) 12/15/2020 1137  ? GFRAA 32 (L) 12/15/2020 1137  ? ? ?INR ?   ?Component Value Date/Time  ? INR 0.9 11/17/2021 1342  ? ? ? ?Intake/Output Summary (Last 24 hours) at 11/21/2021 0829 ?Last data filed at 11/20/2021 0900 ?Gross per 24 hour  ?Intake 240 ml  ?Output --  ?Net 240 ml  ? ? ? ?Assessment/Plan:  76 y.o. female is s/p left femoral to popliteal bypass with vein due to left ankle ulcer 3 Days Post-Op  ? ?Left foot well-perfused based on Doppler exam ?All incisions of left leg are well-appearing ?Plan is for discharge home today with home health PT ?Patient will follow-up in office in about 2 to 3 weeks for incision and wound  check ? ? ? ?Dagoberto Ligas, PA-C ?[facility] ?[phone] ?11/21/2021 ?8:29 AM ? ? ? ?

## 2021-11-21 NOTE — Discharge Summary (Signed)
?Bypass Discharge Summary ?Patient ID: ?[name] ?[id number] ?77 y.o. ?06/07/1946 ? ?Admit date: 11/18/2021 ? ?Discharge date and time: 11/21/21  ? ?Admitting Physician: Serafina Mitchell, MD  ? ?Discharge Physician: Dr. Virl Cagey ? ?Admission Diagnoses: PAD (peripheral artery disease) ([place]) [I73.9] ? ?Discharge Diagnoses: same ? ?Admission Condition: fair ? ?Discharged Condition: fair ? ?Indication for Admission: Postoperative care for bypass surgery ? ?Hospital Course: [name] is a 76 year old female with a nonhealing left medial ankle wound.  She was brought in as an outpatient and underwent left femoral to below the knee popliteal bypass with vein by Dr. Trula Slade on 11/18/2021.  She tolerated the procedure well and was admitted to the hospital postoperatively.  Inpatient care consisted of postoperative pain control and wound care of left ankle.  At the time of discharge her left foot remained well perfused with brisk DP and PT Doppler signals.  Based on therapy recommendations she will be followed by [facility] for physical therapy.  She will be discharged home with 2 weeks of Bactrim.  Given her slight increase in creatinine we will also have her follow-up with her PCP in short order to check her renal function.  Changes to her statin regimen are going to be reflected in her med reconciliation.  She will follow-up in office in about 2 to 3 weeks.  She will also be prescribed narcotic pain medication for continued postoperative pain control.  She will be discharged home in stable condition. ? ?Consults: None ? ?Treatments: surgery: Left femoral to below-knee popliteal bypass with vein by Dr. Trula Slade on 11/18/2021 ? ? ? ?Disposition: Discharge disposition: 01-Home or Self Care ? ? ? ? ? ? ?- For [facility] use --- ? ?Post-op:  ?Wound infection: No  ?Graft infection: No  ?Transfusion: No   ?New Arrhythmia: No ?Patency judged by: [x ] Dopper only, [ ]  Palpable graft pulse, [ ]  Palpable distal pulse, [ ]   ABI inc. > 0.15, [ ]  Duplex ?D/C Ambulatory Status: Ambulatory ? ?Complications: ?MI: [ x] No, [ ]  Troponin only, [ ]  EKG or Clinical ?CHF: No ?Resp failure: [ x] none, [ ]  Pneumonia, [ ]  Ventilator ?Chg in renal function: [ x] none, [ ]  Inc. Cr > 0.5, [ ]  Temp. Dialysis, [ ]  Permanent dialysis ?Stroke: [ x] None, [ ]  Minor, [ ]  Major ?Return to OR: No  ?Reason for return to OR: [ ]  Bleeding, [ ]  Infection, [ ]  Thrombosis, [ ]  Revision ? ?Discharge medications: ?Statin use:  Yes ?ASA use:  Yes ?Plavix use:  No  for medical reason not indicated ?Beta blocker use: No  for medical reason not indicated ?Coumadin use: No  for medical reason not indicated ? ? ? ?Patient Instructions:  ?Allergies as of 11/21/2021   ? ?   Reactions  ? Ciprofloxacin Swelling  ? Ozempic (0.25 Or 0.5 Mg-dose) [semaglutide(0.25 Or 0.25m-dos)] Nausea And Vomiting  ? Plavix [clopidogrel Bisulfate] Palpitations  ? Amoxicillin Itching, Swelling  ? FACE & EYES SWELL  ? Ace Inhibitors   ? Lyrica [pregabalin]   ? Itch, gain weight  ? Penicillins Other (See Comments)  ? Pt unsure if there is an allergic reaction   ? ?  ? ?  ?Medication List  ?  ? ?STOP taking these medications   ? ?cephALEXin 500 MG capsule ?Commonly known as: KEFLEX ?  ?citalopram 40 MG tablet ?Commonly known as: CELEXA ?  ?rosuvastatin 40 MG tablet ?Commonly known as: CRESTOR ?  ? ?  ? ?  TAKE these medications   ? ?amLODipine 10 MG tablet ?Commonly known as: NORVASC ?TAKE 1 TABLET DAILY FOR BLOOD PRESSURE ?  ?aspirin 325 MG EC tablet ?Take 325 mg by mouth at bedtime. ?  ?atorvastatin 80 MG tablet ?Commonly known as: LIPITOR ?Take 1 tablet (80 mg total) by mouth daily. ?Start taking on: November 22, 2021 ?  ?b complex vitamins capsule ?Take 2 capsules by mouth in the morning and at bedtime. ?  ?chlorthalidone 25 MG tablet ?Commonly known as: HYGROTON ?Take 25 mg by mouth every other day. At night ?  ?CoQ10 100 MG Caps ?Take 100 mg by mouth daily. ?  ?Farxiga 10 MG Tabs tablet ?Generic drug:  dapagliflozin propanediol ?TAKE 1 TABLET BY MOUTH EVERY DAY BEFORE BREAKFAST. ?  ?Fish Oil 1200 MG Caps ?Take 1 capsule (1,200 mg total) by mouth daily. ?What changed: how much to take ?  ?glucose blood test strip ?Use as instructed 3x a day ?  ?Insulin Pen Needle 29G X 5MM Misc ?Use with insulin pen 3 times a day. ?  ?Magnesium 500 MG Tabs ?Take 500 mg by mouth daily. ?  ?meclizine 25 MG tablet ?Commonly known as: ANTIVERT ?1/2 tab up to 3 times daily for motion sickness/dizziness. ?What changed:  ?how much to take ?how to take this ?when to take this ?reasons to take this ?  ?NovoLOG FlexPen 100 UNIT/ML FlexPen ?Generic drug: insulin aspart ?10-14 Units 3 (three) times daily with meals. ?  ?olmesartan 40 MG tablet ?Commonly known as: BENICAR ?TAKE 1 TABLET DAILY FOR BLOOD PRESSURE ?  ?omeprazole 40 MG capsule ?Commonly known as: PRILOSEC ?Take 1 capsule (40 mg total) by mouth in the morning and at bedtime. ?What changed: when to take this ?  ?OneTouch Delica Lancets 66M Misc ?Use 3x a day ?  ?OneTouch Verio Flex System w/Device Kit ?USE AS ADVISED ?  ?OVER THE COUNTER MEDICATION ?Take 2 tablets by mouth 3 (three) times daily before meals. Gluco Revolve ?  ?OVER THE COUNTER MEDICATION ?Take 1 capsule by mouth daily. Brain Booster ?  ?oxyCODONE-acetaminophen 5-325 MG tablet ?Commonly known as: PERCOCET/ROXICET ?Take 1 tablet by mouth every 6 (six) hours as needed for moderate pain. ?  ?rOPINIRole 0.25 MG tablet ?Commonly known as: Requip ?Take 1 tablet (0.25 mg total) by mouth at bedtime. ?  ?sulfamethoxazole-trimethoprim 400-80 MG tablet ?Commonly known as: BACTRIM ?Take 1 tablet by mouth every 12 (twelve) hours. ?  ?Theratears 0.25 % Soln ?Generic drug: Carboxymethylcellulose Sodium ?Place 1 drop into both eyes daily as needed (dry eyes). ?  ?Tyler Aas FlexTouch 200 UNIT/ML FlexTouch Pen ?Generic drug: insulin degludec ?INJECT 34 UNITS INTO THE SKIN DAILY. ?What changed: See the new instructions. ?  ? ?   ? ?Activity: activity as tolerated ?Diet: regular diet ?Wound Care: keep wound clean and dry ? ?Follow-up with [facility] in 2 weeks. ? ?Signed: ?Dagoberto Ligas ?11/21/2021 ?9:13 AM ? ?

## 2021-11-21 NOTE — Progress Notes (Signed)
?  Progress Note ? ? ? ?11/21/2021 ?9:16 AM ?3 Days Post-Op ? ?Subjective:  no new complaints ? ? ?Vitals:  ? 11/21/21 0312 11/21/21 0736  ?BP: (!) 158/50 (!) 151/48  ?Pulse: 71 73  ?Resp: 20 20  ?Temp: 99 ?F (37.2 ?C) 98.9 ?F (37.2 ?C)  ?SpO2: 98% 98%  ? ?Physical Exam: ?Lungs:  non labored ?Incisions:  L groin and all incisions of LLE healing well ?Extremities:  brisk multiphasic DP signal; monophasic L PT by doppler ?Neurologic: A&O ? ?CBC ?   ?Component Value Date/Time  ? WBC 13.0 (H) 11/19/2021 0154  ? RBC 3.80 (L) 11/19/2021 0154  ? HGB 12.6 11/19/2021 0154  ? HCT 38.6 11/19/2021 0154  ? PLT 225 11/19/2021 0154  ? MCV 101.6 (H) 11/19/2021 0154  ? MCH 33.2 11/19/2021 0154  ? MCHC 32.6 11/19/2021 0154  ? RDW 12.1 11/19/2021 0154  ? LYMPHSABS 3,006 11/10/2021 1539  ? MONOABS 0.7 06/06/2018 1342  ? EOSABS 180 11/10/2021 1539  ? BASOSABS 63 11/10/2021 1539  ? ? ?BMET ?   ?Component Value Date/Time  ? NA 137 11/20/2021 0149  ? K 4.2 11/20/2021 0149  ? CL 101 11/20/2021 0149  ? CO2 27 11/20/2021 0149  ? GLUCOSE 156 (H) 11/20/2021 0149  ? BUN 33 (H) 11/20/2021 0149  ? CREATININE 1.98 (H) 11/20/2021 0149  ? CREATININE 1.79 (H) 11/10/2021 1539  ? CALCIUM 8.5 (L) 11/20/2021 0149  ? GFRNONAA 26 (L) 11/20/2021 0149  ? GFRNONAA 28 (L) 12/15/2020 1137  ? GFRAA 32 (L) 12/15/2020 1137  ? ? ?INR ?   ?Component Value Date/Time  ? INR 0.9 11/17/2021 1342  ? ? ?No intake or output data in the 24 hours ending 11/21/21 0916 ? ? ? ?Assessment/Plan:  76 y.o. female is s/p L femoral to popliteal bypass with vein for L ankle ulcer 3 Days Post-Op  ? ?L foot well perfused with DP and PT signals by doppler ?Incisions appear to be healing well ?Therapy recommending [facility] ?Discharge today ? ?Patient with known CKD-will have follow-up with PCP this week for creatinine check, especially as she is on Bactrim ?Wounds healing appropriately, asked to call our office should any drainage occur. ? ? ? ?Broadus John MD ?[facility] ?[phone] ?11/21/2021 ?9:16 AM ? ? ? ?

## 2021-11-22 DIAGNOSIS — M1380 Other specified arthritis, unspecified site: Secondary | ICD-10-CM | POA: Diagnosis not present

## 2021-11-22 DIAGNOSIS — I129 Hypertensive chronic kidney disease with stage 1 through stage 4 chronic kidney disease, or unspecified chronic kidney disease: Secondary | ICD-10-CM | POA: Diagnosis not present

## 2021-11-22 DIAGNOSIS — Z792 Long term (current) use of antibiotics: Secondary | ICD-10-CM | POA: Diagnosis not present

## 2021-11-22 DIAGNOSIS — E1122 Type 2 diabetes mellitus with diabetic chronic kidney disease: Secondary | ICD-10-CM | POA: Diagnosis not present

## 2021-11-22 DIAGNOSIS — J439 Emphysema, unspecified: Secondary | ICD-10-CM | POA: Diagnosis not present

## 2021-11-22 DIAGNOSIS — Z794 Long term (current) use of insulin: Secondary | ICD-10-CM | POA: Diagnosis not present

## 2021-11-22 DIAGNOSIS — Z7984 Long term (current) use of oral hypoglycemic drugs: Secondary | ICD-10-CM | POA: Diagnosis not present

## 2021-11-22 DIAGNOSIS — S81802D Unspecified open wound, left lower leg, subsequent encounter: Secondary | ICD-10-CM | POA: Diagnosis not present

## 2021-11-22 DIAGNOSIS — Z7982 Long term (current) use of aspirin: Secondary | ICD-10-CM | POA: Diagnosis not present

## 2021-11-22 DIAGNOSIS — I739 Peripheral vascular disease, unspecified: Secondary | ICD-10-CM | POA: Diagnosis not present

## 2021-11-22 DIAGNOSIS — Z48812 Encounter for surgical aftercare following surgery on the circulatory system: Secondary | ICD-10-CM | POA: Diagnosis not present

## 2021-11-22 DIAGNOSIS — N184 Chronic kidney disease, stage 4 (severe): Secondary | ICD-10-CM | POA: Diagnosis not present

## 2021-11-23 ENCOUNTER — Other Ambulatory Visit (HOSPITAL_COMMUNITY): Payer: Medicare Other

## 2021-11-23 DIAGNOSIS — N184 Chronic kidney disease, stage 4 (severe): Secondary | ICD-10-CM | POA: Diagnosis not present

## 2021-11-25 ENCOUNTER — Inpatient Hospital Stay: Payer: Medicare Other | Admitting: Internal Medicine

## 2021-11-25 DIAGNOSIS — S81802D Unspecified open wound, left lower leg, subsequent encounter: Secondary | ICD-10-CM | POA: Diagnosis not present

## 2021-11-25 DIAGNOSIS — Z48812 Encounter for surgical aftercare following surgery on the circulatory system: Secondary | ICD-10-CM | POA: Diagnosis not present

## 2021-11-25 DIAGNOSIS — I129 Hypertensive chronic kidney disease with stage 1 through stage 4 chronic kidney disease, or unspecified chronic kidney disease: Secondary | ICD-10-CM | POA: Diagnosis not present

## 2021-11-25 DIAGNOSIS — N184 Chronic kidney disease, stage 4 (severe): Secondary | ICD-10-CM | POA: Diagnosis not present

## 2021-11-25 DIAGNOSIS — E1122 Type 2 diabetes mellitus with diabetic chronic kidney disease: Secondary | ICD-10-CM | POA: Diagnosis not present

## 2021-11-25 DIAGNOSIS — I739 Peripheral vascular disease, unspecified: Secondary | ICD-10-CM | POA: Diagnosis not present

## 2021-11-25 NOTE — Telephone Encounter (Signed)
11/25/21-Called patient to see if she was interested in scheduling an appointment with our cpp. Pt. Politely declined at this time to schedule. Pt. Will call if she changes her mind.  ? ?Total time spent: 3 Minutes ?Vanetta Shawl, [facility] ?

## 2021-11-30 ENCOUNTER — Ambulatory Visit (INDEPENDENT_AMBULATORY_CARE_PROVIDER_SITE_OTHER): Payer: Medicare Other | Admitting: Orthopedic Surgery

## 2021-11-30 ENCOUNTER — Encounter: Payer: Self-pay | Admitting: Orthopedic Surgery

## 2021-11-30 DIAGNOSIS — E1122 Type 2 diabetes mellitus with diabetic chronic kidney disease: Secondary | ICD-10-CM | POA: Diagnosis not present

## 2021-11-30 DIAGNOSIS — N184 Chronic kidney disease, stage 4 (severe): Secondary | ICD-10-CM | POA: Diagnosis not present

## 2021-11-30 DIAGNOSIS — R609 Edema, unspecified: Secondary | ICD-10-CM | POA: Diagnosis not present

## 2021-11-30 DIAGNOSIS — I739 Peripheral vascular disease, unspecified: Secondary | ICD-10-CM

## 2021-11-30 DIAGNOSIS — D631 Anemia in chronic kidney disease: Secondary | ICD-10-CM | POA: Diagnosis not present

## 2021-11-30 DIAGNOSIS — R809 Proteinuria, unspecified: Secondary | ICD-10-CM | POA: Diagnosis not present

## 2021-11-30 DIAGNOSIS — I771 Stricture of artery: Secondary | ICD-10-CM

## 2021-11-30 DIAGNOSIS — L98499 Non-pressure chronic ulcer of skin of other sites with unspecified severity: Secondary | ICD-10-CM | POA: Diagnosis not present

## 2021-11-30 DIAGNOSIS — I779 Disorder of arteries and arterioles, unspecified: Secondary | ICD-10-CM

## 2021-11-30 DIAGNOSIS — N2581 Secondary hyperparathyroidism of renal origin: Secondary | ICD-10-CM | POA: Diagnosis not present

## 2021-11-30 DIAGNOSIS — I129 Hypertensive chronic kidney disease with stage 1 through stage 4 chronic kidney disease, or unspecified chronic kidney disease: Secondary | ICD-10-CM | POA: Diagnosis not present

## 2021-11-30 IMAGING — CT CT CHEST SUPER D W/O CM
2 of 5 series · 14 of 36 positions shown, 17 images · non-contrast
Comparison: November 19, 2019

CLINICAL DATA: Follow-up ground-glass opacities.

EXAM:
CT CHEST WITHOUT CONTRAST
TECHNIQUE: Multidetector CT imaging of the chest was performed using thin slice
collimation for electromagnetic bronchoscopy planning purposes,
without intravenous contrast.

[Series 4: chest 2.00 br40 s3 · coronal · 0.60mm/px · 3 of 158 slices shown]
[im 32/158  lung]
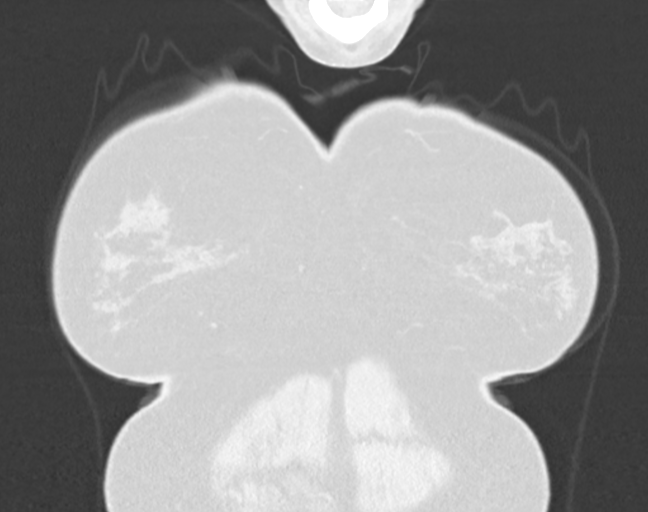
[im 63/158  lung]
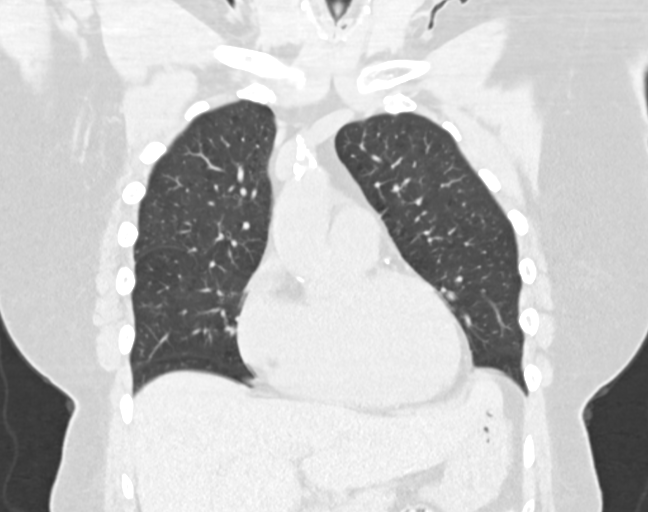
[im 95/158  lung]
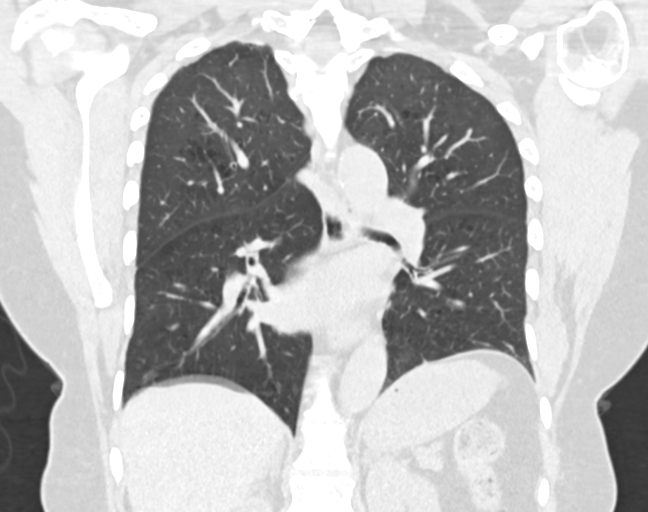

[Series 10: chest 1.00 br40 s3 super d · axial · 0.75mm/px · z∈[+1545,+1807]mm · 11 of 380 slices shown, 14 images]
[im 26/380  mediastinal]
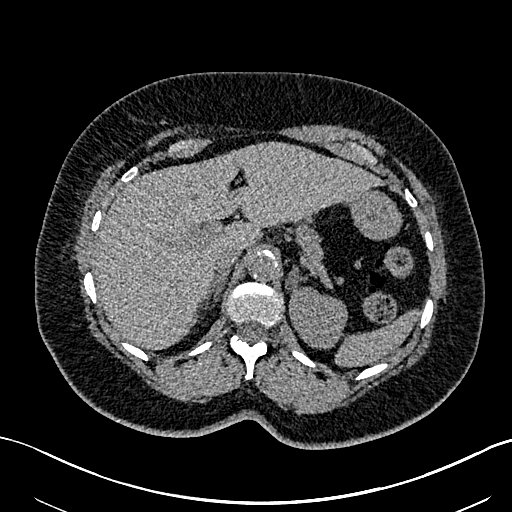
[im 26/380  lung]
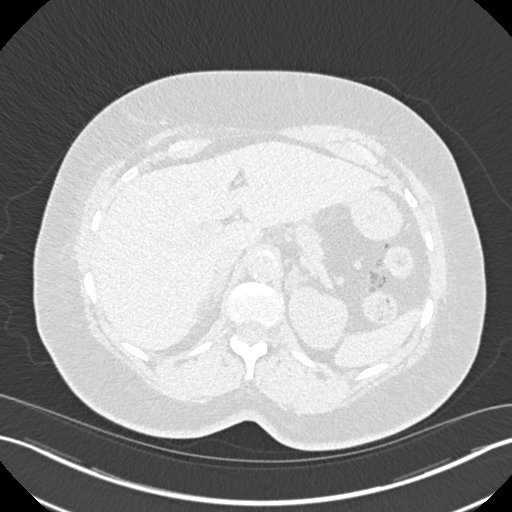
[im 51/380  lung]
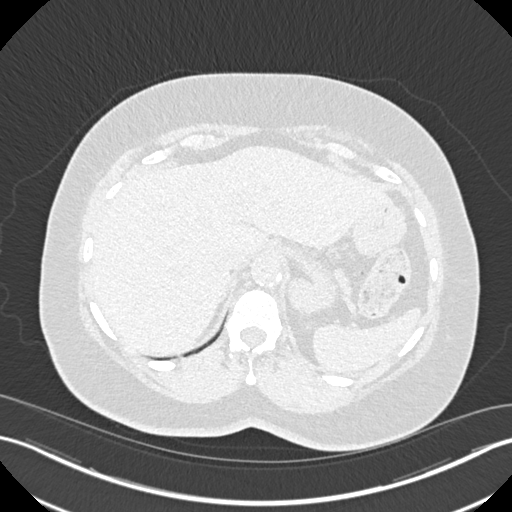
[im 102/380  lung]
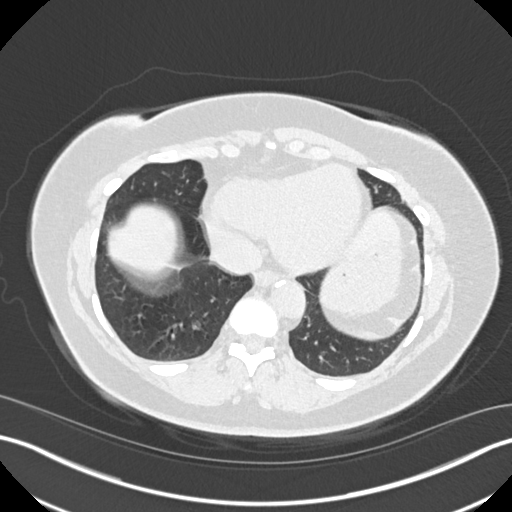
[im 127/380  lung]
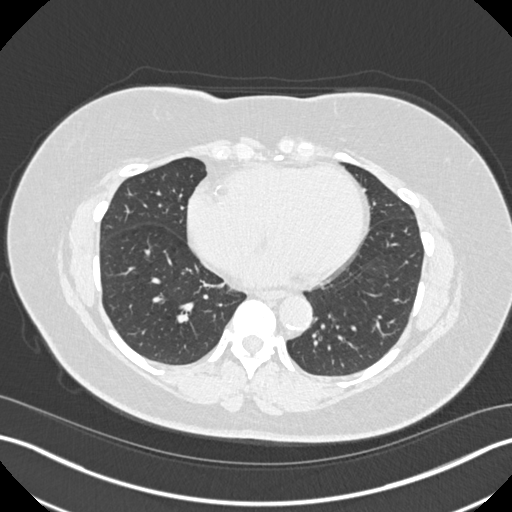
[im 152/380  mediastinal]
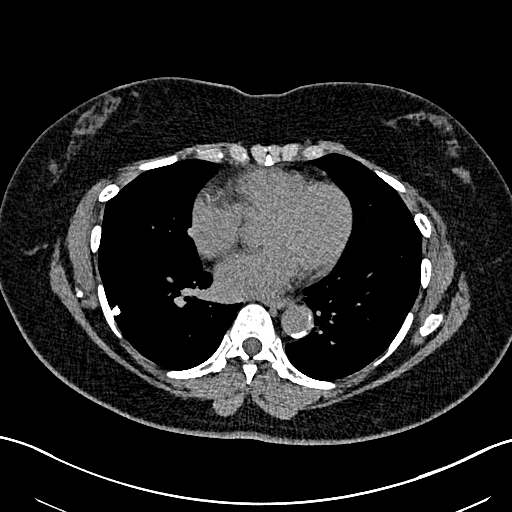
[im 152/380  lung]
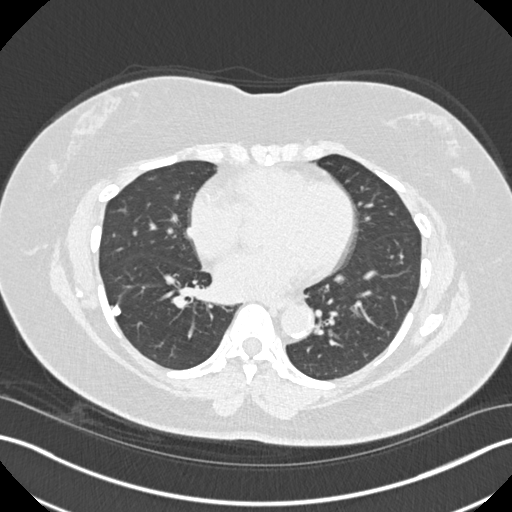
[im 203/380  lung]
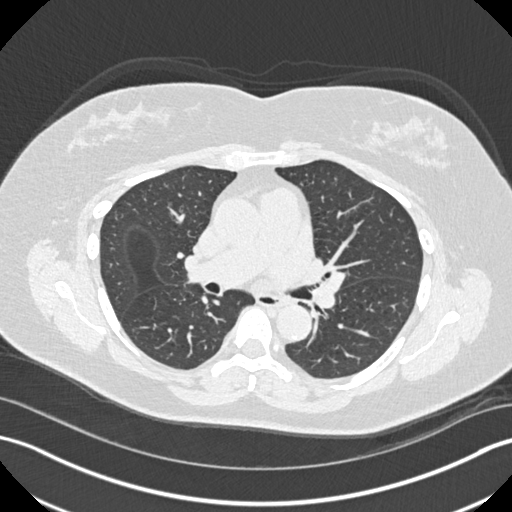
[im 228/380  lung]
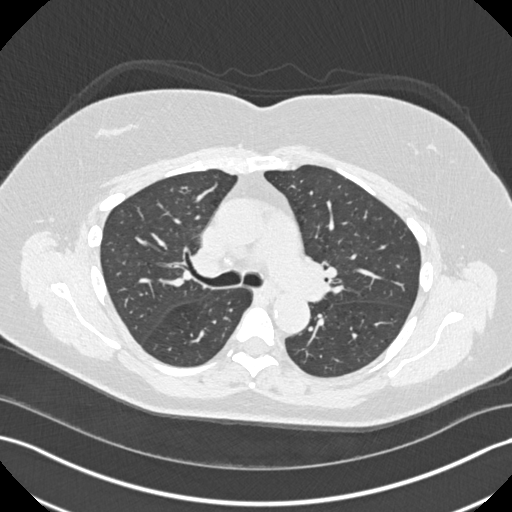
[im 253/380  lung]
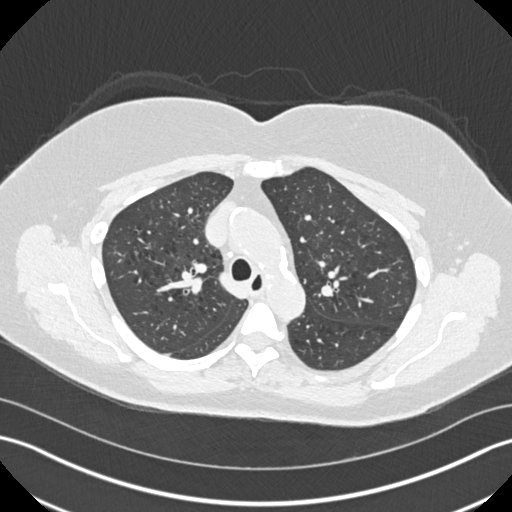
[im 278/380  mediastinal]
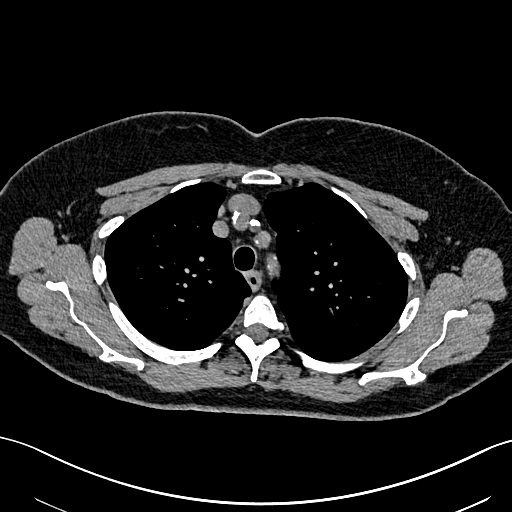
[im 278/380  lung]
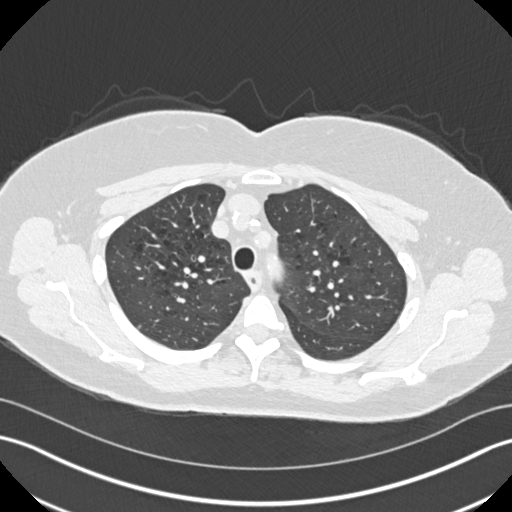
[im 329/380  lung]
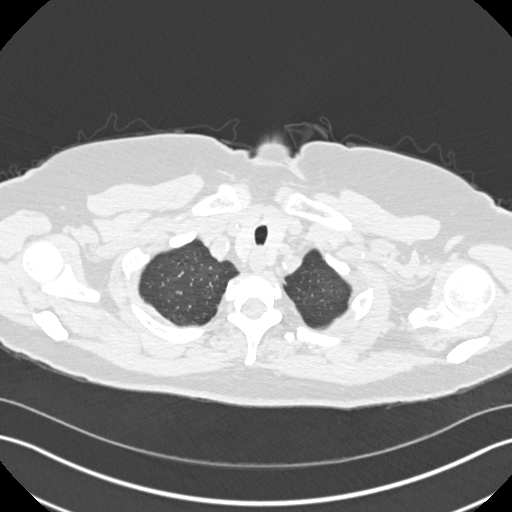
[im 354/380  lung]
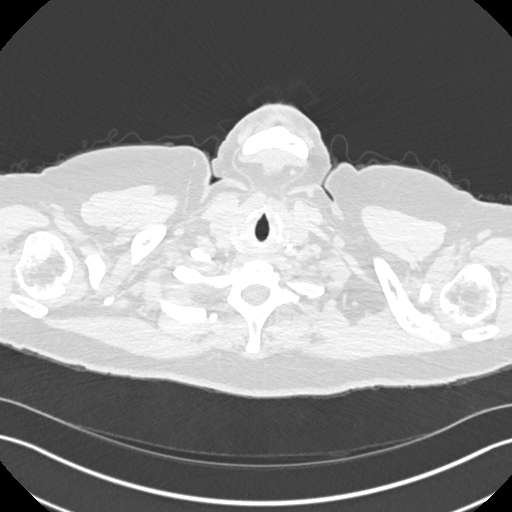

[14 of 36 positions shown; findings below may reference images not displayed]

FINDINGS: Cardiovascular: Calcified atheromatous plaque in the thoracic aorta.
Three-vessel coronary artery disease. Both these findings are
unchanged compared to the prior study. No aneurysmal dilation.
Central pulmonary vasculature unremarkable on noncontrast imaging.
No pericardial effusion. Limited assessment of cardiovascular
structures given lack of intravenous contrast.

Mediastinum/Nodes: Thoracic inlet structures are normal. No axillary
lymphadenopathy. No mediastinal adenopathy. No hilar adenopathy.

Lungs/Pleura: RIGHT upper lobe ground-glass opacity measuring 1.7 x
1.2 cm within 1 mm of previous measurement (image 28, series 8)

Millimetric nodules in the RIGHT upper lobe seen on the prior study
are unchanged.

Densely calcified granuloma in the RIGHT lower lobe as before.

RIGHT middle lobe bronchial dilation is stable.

Centrilobular pulmonary emphysema as before.  Airways are patent.

Upper Abdomen: Incidental imaging of upper abdominal contents
without acute process. Thickening of bilateral adrenals likely
adrenal hyperplasia similar to previous imaging, adrenals are
incompletely imaged.

Musculoskeletal: No acute musculoskeletal process. Spinal
degenerative changes.
IMPRESSION: 1. RIGHT upper lobe ground-glass opacity measuring 1.7 x 1.2 cm
within 1 mm of previous measurement. Findings shows slow enlargement
over time with definite difference in size compared to the study May 16, 2015 where it measured approximately 1.3 x 1.2 cm. Slow
enlargement and persistence could be seen in the setting of
bronchogenic neoplasm, but there is no current solid component or
significant change since studies dating back to Monday November, 2018
2. Millimetric nodules in the RIGHT upper lobe seen on the prior
study are unchanged.
3. Three-vessel coronary artery disease.
4. Emphysema and aortic atherosclerosis.

Aortic Atherosclerosis (A5WZ0-HB2.2) and Emphysema ([id number].1).

## 2021-11-30 NOTE — Progress Notes (Signed)
Office Visit Note   Patient: [name]           Date of Birth: February 22, 1946           MRN: [id number] Visit Date: 11/30/2021              Requested by: [name], [place] [address] [place],  [place] [address] PCP: [name], MD  Chief Complaint  Patient presents with   Left Foot - Follow-up      HPI: Patient is a 76 year old woman who is seen for the ischemic ulcer medial left calf.  She is status post revascularization with Dr. Trula Slade on February 28.  Patient complains of increased swelling in the entire leg with difficulty moving her knee.  Assessment & Plan: Visit Diagnoses:  1. Arterial insufficiency with ischemic ulcer ([place])   2. Severe peripheral arterial disease ([place])     Plan: Patient was placed in light compression to help with the lower extremity edema her leg measures a size large she was placed in an extra-large sock that extended above her knee.  Patient states this felt comfortable she will wear this around-the-clock change daily.  Follow-Up Instructions: Return in about 1 week (around 12/07/2021).   Ortho Exam  Patient is alert, oriented, no adenopathy, well-dressed, normal affect, normal respiratory effort. Examination patient has improved circulation to the left lower extremity the surgical incisions are well approximated.  Patient does have increased swelling in the entire left lower extremity with pitting edema.  The ulcer is still ischemic in the wound bed.  There is 1 blistered area and one full-thickness ulcer.  Clinically patient has improved.  Patient's calf measures 37 cm in circumference.  Imaging: No results found. No images are attached to the encounter.  Labs: Lab Results  Component Value Date   HGBA1C 7.1 (H) 11/17/2021   HGBA1C 8.5 (H) 08/04/2021   HGBA1C 8.7 (A) 06/08/2021   ESRSEDRATE 11 06/06/2018   ESRSEDRATE 12 05/01/2009   LABURIC 4.6 04/18/2018   REPTSTATUS 07/25/2017 FINAL 07/23/2017   CULT  >=100,000 COLONIES/mL ESCHERICHIA COLI (A) 07/23/2017   LABORGA ESCHERICHIA COLI (A) 07/23/2017     Lab Results  Component Value Date   ALBUMIN 3.2 (L) 11/18/2021   ALBUMIN 3.5 10/28/2020   ALBUMIN 3.0 (L) 01/27/2017    Lab Results  Component Value Date   MG 2.2 08/04/2021   MG 2.5 08/04/2020   MG 1.9 11/13/2018   Lab Results  Component Value Date   VD25OH 21 (L) 08/04/2020   VD25OH 27 (L) 04/18/2018   VD25OH 22 (L) 07/22/2016    No results found for: PREALBUMIN CBC EXTENDED Latest Ref Rng & Units 11/19/2021 11/17/2021 11/10/2021  WBC 4.0 - 10.5 K/uL 13.0(H) 10.2 9.0  RBC 3.87 - 5.11 MIL/uL 3.80(L) 4.49 4.53  HGB 12.0 - 15.0 g/dL 12.6 15.0 15.1  HCT 36.0 - 46.0 % 38.6 45.4 45.2(H)  PLT 150 - 400 K/uL 225 336 260  NEUTROABS 1,500 - 7,800 cells/uL - - 4,995  LYMPHSABS 850 - 3,900 cells/uL - - 3,006     There is no height or weight on file to calculate BMI.  Orders:  No orders of the defined types were placed in this encounter.  No orders of the defined types were placed in this encounter.    Procedures: No procedures performed  Clinical Data: No additional findings.  ROS:  All other systems negative, except as noted in the HPI. Review of Systems  Objective: Vital  Signs: There were no vitals taken for this visit.  Specialty Comments:  No specialty comments available.  PMFS History: Patient Active Problem List   Diagnosis Date Noted   PAD (peripheral artery disease) ([place]) 11/18/2021   Infected traumatic leg ulcer, left, with fat layer exposed ([place]) 11/11/2021   Infected wound 11/10/2021   Pain in right knee 01/07/2020   Carotid artery stenosis 12/03/2019   Migraine with aura and without status migrainosus, not intractable 11/28/2019   Hyperlipidemia associated with type 2 diabetes mellitus ([place]) 11/10/2019   Coronary artery calcification 08/05/2019   PAOD (peripheral arterial occlusive disease) ([place]) 02/26/2019   SOB (shortness of breath) 11/17/2018    Cardiomegaly 11/17/2018   Emphysema of lung ([place]) 10/10/2017   Atherosclerosis of aorta ([place]) 10/10/2017   Lung nodule 10/10/2017   Anxiety 08/22/2017   Arthritis 08/22/2017   Neuropathy due to secondary diabetes ([place]) 08/22/2017   PVD (peripheral vascular disease) ([place]) 04/13/2017   Type 2 diabetes mellitus with circulatory disorder, with long-term current use of insulin ([place]) 11/07/2015   Encounter for Medicare annual wellness exam 06/24/2015   Generalized anxiety disorder 03/26/2015   Depression, major, recurrent, in partial remission ([place]) 03/26/2015   History of CVA (cerebrovascular accident) 10/04/2014   Tobacco abuse 10/04/2014   Vitamin D deficiency 11/02/2013   Medication management 11/02/2013   Peripheral vascular disease due to secondary diabetes mellitus ([place]) 05/08/2012   Atherosclerosis of native arteries of extremity with intermittent claudication 12/24/2011   Diverticulosis of large intestine 06/09/2010   History of colonic polyps 06/09/2010   Esophageal reflux 08/27/2008   Nontoxic multinodular goiter 03/11/2008   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[place]) 03/11/2008   Brain aneurysm 03/11/2008   CKD stage 4 due to type 2 diabetes mellitus ([place]) 05/01/2007   Essential hypertension 05/01/2007   Past Medical History:  Diagnosis Date   Abnormal findings on esophagogastroduodenoscopy (EGD) 07/2010   Aneurysm ([place]) 2003   in brain x 2,  "small"   Anxiety    Arthritis    Cataract    CKD (chronic kidney disease), stage IV ([place])    followed by [facility]   Colon polyps    Diabetes mellitus 1998   Diverticulosis    Emphysema of lung ([place])    "no one has evry told me that"   ESOPHAGEAL STRICTURE 08/27/2008   Family history of adverse reaction to anesthesia    daughter n/v   GERD (gastroesophageal reflux disease)    Hemorrhoids    Hiatal hernia    Hyperlipidemia    Hypertension 2000   Migraines    Neuropathy    fingers and toe   Peripheral vascular  disease ([place])    Phlebitis    30 years ago  left leg   Stroke [facility])    multiple mini strokes ( brain aneurysm ).  Right side weaker.    Family History  Problem Relation Age of Onset   CAD Mother 72       Died of MI   Hypertension Mother    Heart attack Mother    Heart disease Mother    CAD Father 70       Died of MI   Heart disease Father    Heart attack Father    CAD Brother 67       Two brothers died of MI   Heart attack Brother    Heart disease Brother        Amputation   Diabetes Sister    Hypertension  Sister    Heart attack Brother    Heart disease Brother    Stroke Sister    Colon cancer Neg Hx    Stomach cancer Neg Hx    Esophageal cancer Neg Hx     Past Surgical History:  Procedure Laterality Date   ABDOMINAL AORTAGRAM N/A 01/04/2012   Procedure: ABDOMINAL AORTAGRAM;  Surgeon: Serafina Mitchell, MD;  Location: [facility] CATH LAB;  Service: Cardiovascular;  Laterality: N/A;   ABDOMINAL AORTAGRAM N/A 08/15/2012   Procedure: ABDOMINAL Maxcine Ham;  Surgeon: Serafina Mitchell, MD;  Location: [facility] CATH LAB;  Service: Cardiovascular;  Laterality: N/A;   ABDOMINAL AORTOGRAM W/LOWER EXTREMITY Left 11/17/2021   Procedure: ABDOMINAL AORTOGRAM W/LOWER EXTREMITY;  Surgeon: Serafina Mitchell, MD;  Location: [facility] CV LAB;  Service: Cardiovascular;  Laterality: Left;   ABDOMINAL HYSTERECTOMY  1984   cataract surgery Right 10-22-2015   cataract surgery Left 11-05-2015   CESAREAN SECTION     CHOLECYSTECTOMY     COLONOSCOPY  07/2010   DENTAL SURGERY  Aug. 16, 2013   left lower    ESOPHAGOGASTRODUODENOSCOPY  07/2010   EYE SURGERY  Nov. 2014   Laser-Glaucoma   FEMORAL ARTERY STENT  05/11/11   Left superficial femoral and popliteal artery   FEMORAL-POPLITEAL BYPASS GRAFT Left 11/18/2021   Procedure: LEFT FEMORAL-POPLITEAL ARTERY BYPASS GRAFT;  Surgeon: Serafina Mitchell, MD;  Location: [facility];  Service: Vascular;  Laterality: Left;   HERNIA REPAIR     times two   KNEE SURGERY     LOWER  EXTREMITY ANGIOGRAM Left 08/15/2012   Procedure: LOWER EXTREMITY ANGIOGRAM;  Surgeon: Serafina Mitchell, MD;  Location: [facility] CATH LAB;  Service: Cardiovascular;  Laterality: Left;  lt leg angio   rotator cuff surgery     THYROID SURGERY     Social History   Occupational History   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  Tobacco Use   Smoking status: Light Smoker    Packs/day: 0.40    Years: 40.00    Pack years: 16.00    Types: Cigarettes   Smokeless tobacco: Never   Tobacco comments:    11/18/20 in process of quiting  Vaping Use   Vaping Use: Never used  Substance and Sexual Activity   Alcohol use: No    Alcohol/week: 0.0 standard drinks   Drug use: Never   Sexual activity: Not on file

## 2021-12-01 DIAGNOSIS — N184 Chronic kidney disease, stage 4 (severe): Secondary | ICD-10-CM | POA: Diagnosis not present

## 2021-12-01 DIAGNOSIS — Z48812 Encounter for surgical aftercare following surgery on the circulatory system: Secondary | ICD-10-CM | POA: Diagnosis not present

## 2021-12-01 DIAGNOSIS — E1122 Type 2 diabetes mellitus with diabetic chronic kidney disease: Secondary | ICD-10-CM | POA: Diagnosis not present

## 2021-12-01 DIAGNOSIS — I129 Hypertensive chronic kidney disease with stage 1 through stage 4 chronic kidney disease, or unspecified chronic kidney disease: Secondary | ICD-10-CM | POA: Diagnosis not present

## 2021-12-01 DIAGNOSIS — S81802D Unspecified open wound, left lower leg, subsequent encounter: Secondary | ICD-10-CM | POA: Diagnosis not present

## 2021-12-01 DIAGNOSIS — I739 Peripheral vascular disease, unspecified: Secondary | ICD-10-CM | POA: Diagnosis not present

## 2021-12-02 NOTE — Telephone Encounter (Signed)
LM-12/02/21-Called patient for hospital discharge review call. Pt. Declined to schedule a visit w/ cpp at this time and will call when she is ready to schedule after her recovery. Told pt. To please reach out to [place] if she has any questions or concerns. Pt. Verbalized understanding and agreed. ? ?Total time spent:  ?

## 2021-12-07 ENCOUNTER — Other Ambulatory Visit: Payer: Self-pay

## 2021-12-07 ENCOUNTER — Ambulatory Visit (INDEPENDENT_AMBULATORY_CARE_PROVIDER_SITE_OTHER): Payer: Medicare Other | Admitting: Surgery

## 2021-12-07 ENCOUNTER — Encounter: Payer: Self-pay | Admitting: Surgery

## 2021-12-07 ENCOUNTER — Ambulatory Visit (INDEPENDENT_AMBULATORY_CARE_PROVIDER_SITE_OTHER): Payer: Medicare Other | Admitting: Orthopedic Surgery

## 2021-12-07 VITALS — BP 127/58 | HR 76 | Temp 98.2°F | Resp 20 | Ht 66.0 in | Wt 180.0 lb

## 2021-12-07 DIAGNOSIS — I779 Disorder of arteries and arterioles, unspecified: Secondary | ICD-10-CM | POA: Diagnosis not present

## 2021-12-07 DIAGNOSIS — L98499 Non-pressure chronic ulcer of skin of other sites with unspecified severity: Secondary | ICD-10-CM | POA: Diagnosis not present

## 2021-12-07 DIAGNOSIS — I771 Stricture of artery: Secondary | ICD-10-CM | POA: Diagnosis not present

## 2021-12-07 DIAGNOSIS — I70243 Atherosclerosis of 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: Secondary | ICD-10-CM

## 2021-12-07 NOTE — Progress Notes (Signed)
? ?Patient name: [name] MRN: [id number] DOB: May 23, 1946 Sex: female ? ?REASON FOR VISIT:  ? ? ? Post op ? ?HISTORY OF PRESENT ILLNESS:  ? ?[name] is a 76 y.o. female who I saw on 11/16/2021 for a leg wound.  She has a history of left superficial femoral artery stenting in 2012 and developed early in-stent stenosis which was treated with balloon angioplasty which ultimately failed.  Up to this point, she had been managing her claudication symptoms.  On 11/18/2021 she underwent left femoral to below-knee popliteal artery bypass graft with vein.  I was originally going to do an above-knee bypass, however the artery was heavily calcified at this level. ? ?She has a history of smoking.  She has known carotid disease.  She is medically managed for hypertension with an ARB.  She is on a statin for hypercholesterolemia.  She is a diabetic. ? ?CURRENT MEDICATIONS:  ? ? ?Current Outpatient Medications  ?Medication Sig Dispense Refill  ? amLODipine (NORVASC) 10 MG tablet TAKE 1 TABLET DAILY FOR BLOOD PRESSURE 90 tablet 3  ? aspirin 325 MG EC tablet Take 325 mg by mouth at bedtime.    ? atorvastatin (LIPITOR) 80 MG tablet Take 1 tablet (80 mg total) by mouth daily. 30 tablet 2  ? b complex vitamins capsule Take 2 capsules by mouth in the morning and at bedtime.    ? Blood Glucose Monitoring Suppl (ONETOUCH VERIO FLEX SYSTEM) w/Device KIT USE AS ADVISED 1 kit 0  ? Carboxymethylcellulose Sodium (THERATEARS) 0.25 % SOLN Place 1 drop into both eyes daily as needed (dry eyes).    ? chlorthalidone (HYGROTON) 25 MG tablet Take 25 mg by mouth every other day. At night    ? Coenzyme Q10 (COQ10) 100 MG CAPS Take 100 mg by mouth daily.    ? FARXIGA 10 MG TABS tablet TAKE 1 TABLET BY MOUTH EVERY DAY BEFORE BREAKFAST. 90 tablet 0  ? glucose blood test strip Use as instructed 3x a day 200 each 12  ? insulin aspart (NOVOLOG FLEXPEN) 100 UNIT/ML FlexPen 10-14 Units 3 (three) times daily with meals.     ? Insulin Pen Needle 29G X 5MM MISC Use with insulin pen 3 times a day. 300 each 11  ? meclizine (ANTIVERT) 25 MG tablet 1/2 tab up to 3 times daily for motion sickness/dizziness. (Patient taking differently: Take 12.5 mg by mouth 3 (three) times daily as needed for dizziness. 1/2 tab up to 3 times daily for motion sickness/dizziness.) 30 tablet 0  ? olmesartan (BENICAR) 40 MG tablet TAKE 1 TABLET DAILY FOR BLOOD PRESSURE 90 tablet 3  ? Omega-3 Fatty Acids (FISH OIL) 1200 MG CAPS Take 1 capsule (1,200 mg total) by mouth daily.    ? omeprazole (PRILOSEC) 40 MG capsule Take 1 capsule (40 mg total) by mouth in the morning and at bedtime. (Patient taking differently: Take 40 mg by mouth at bedtime.) 180 capsule 3  ? OneTouch Delica Lancets 69S MISC Use 3x a day 200 each 3  ? OVER THE COUNTER MEDICATION Take 2 tablets by mouth 3 (three) times daily before meals. Gluco Revolve    ? OVER THE COUNTER MEDICATION Take 1 capsule by mouth daily. Brain Booster    ? oxyCODONE-acetaminophen (PERCOCET/ROXICET) 5-325 MG tablet Take 1 tablet by mouth every 6 (six) hours as needed for moderate pain. 20 tablet 0  ? rOPINIRole (REQUIP) 0.25 MG tablet Take 1 tablet (0.25 mg total) by mouth at bedtime. 90 tablet  2  ? sulfamethoxazole-trimethoprim (BACTRIM) 400-80 MG tablet Take 1 tablet by mouth every 12 (twelve) hours. 28 tablet 0  ? TRESIBA FLEXTOUCH 200 UNIT/ML FlexTouch Pen INJECT 34 UNITS INTO THE SKIN DAILY. (Patient taking differently: Inject 40 Units into the skin daily.) 9 mL 1  ? ?No current facility-administered medications for this visit.  ? ? ?REVIEW OF SYSTEMS:  ? ?[X]  denotes positive finding, [ ]  denotes negative finding ?Cardiac  Comments:  ?Chest pain or chest pressure:    ?Shortness of breath upon exertion:    ?Short of breath when lying flat:    ?Irregular heart rhythm:    ?Constitutional    ?Fever or chills:    ? ? ?PHYSICAL EXAM:  ? ?Vitals:  ? 12/07/21 0934  ?BP: (!) 127/58  ?Pulse: 76  ?Resp: 20  ?Temp: 98.2 ?F  (36.8 ?C)  ?SpO2: 96%  ?Weight: 180 lb (81.6 kg)  ?Height: 5' 6"  (1.676 m)  ? ? ?GENERAL: The patient is a well-nourished female, in no acute distress. The vital signs are documented above. ?CARDIOVASCULAR: There is a regular rate and rhythm. ?PULMONARY: Non-labored respirations ?Brisk posterior tibial signal.  Biphasic DP and peroneal ? ?STUDIES:  ? ?None ? ? ?MEDICAL ISSUES:  ? ?Incisions have healed nicely.  Bypass graft is widely patent by hand-held Doppler.  Her wound is being managed by Dr. Sharol Given but is improving.  She will follow-up in 3 months with a duplex of her bypass ? ?[name], IV, MD, FACS ?[facility] ?Tel [phone] ?Pager [phone] ? ? ?  ?

## 2021-12-08 ENCOUNTER — Encounter: Payer: Self-pay | Admitting: Orthopedic Surgery

## 2021-12-08 DIAGNOSIS — N184 Chronic kidney disease, stage 4 (severe): Secondary | ICD-10-CM | POA: Diagnosis not present

## 2021-12-08 DIAGNOSIS — E1122 Type 2 diabetes mellitus with diabetic chronic kidney disease: Secondary | ICD-10-CM | POA: Diagnosis not present

## 2021-12-08 DIAGNOSIS — I739 Peripheral vascular disease, unspecified: Secondary | ICD-10-CM | POA: Diagnosis not present

## 2021-12-08 DIAGNOSIS — I129 Hypertensive chronic kidney disease with stage 1 through stage 4 chronic kidney disease, or unspecified chronic kidney disease: Secondary | ICD-10-CM | POA: Diagnosis not present

## 2021-12-08 DIAGNOSIS — Z48812 Encounter for surgical aftercare following surgery on the circulatory system: Secondary | ICD-10-CM | POA: Diagnosis not present

## 2021-12-08 DIAGNOSIS — S81802D Unspecified open wound, left lower leg, subsequent encounter: Secondary | ICD-10-CM | POA: Diagnosis not present

## 2021-12-08 NOTE — Progress Notes (Signed)
Office Visit Note   Patient: [name]           Date of Birth: 1946/01/27           MRN: [id number] Visit Date: 12/07/2021              Requested by: Lucky Cowboy, MD [address] [place],  [place] [address] PCP: Lucky Cowboy, MD  Chief Complaint  Patient presents with   Left Leg - Wound Check      HPI: Patient is a 76 year old woman who presents with ischemic ulcers medial left calf.  She is status post revascularization February 28.  She is currently in light compression with an extra-large knee-high compression sock.  Assessment & Plan: Visit Diagnoses:  1. Arterial insufficiency with ischemic ulcer (HCC)     Plan: Patient is showing slow steady improvement to the ulcers.  She will continue with the compression sock and wound cleansing.  Follow-Up Instructions: Return in about 4 weeks (around 01/04/2022).   Ortho Exam  Patient is alert, oriented, no adenopathy, well-dressed, normal affect, normal respiratory effort. Examination patient has a strong palpable dorsalis pedis pulse the 2 ulcers are almost completely healed the proximal ulcer is covered with a thin scab distal ulcer is 2 cm in diameter with healthy granulation tissue.  Imaging: No results found. No images are attached to the encounter.  Labs: Lab Results  Component Value Date   HGBA1C 7.1 (H) 11/17/2021   HGBA1C 8.5 (H) 08/04/2021   HGBA1C 8.7 (A) 06/08/2021   ESRSEDRATE 11 06/06/2018   ESRSEDRATE 12 05/01/2009   LABURIC 4.6 04/18/2018   REPTSTATUS 07/25/2017 FINAL 07/23/2017   CULT >=100,000 COLONIES/mL ESCHERICHIA COLI (A) 07/23/2017   LABORGA ESCHERICHIA COLI (A) 07/23/2017     Lab Results  Component Value Date   ALBUMIN 3.2 (L) 11/18/2021   ALBUMIN 3.5 10/28/2020   ALBUMIN 3.0 (L) 01/27/2017    Lab Results  Component Value Date   MG 2.2 08/04/2021   MG 2.5 08/04/2020   MG 1.9 11/13/2018   Lab Results  Component Value Date   VD25OH 21 (L) 08/04/2020    VD25OH 27 (L) 04/18/2018   VD25OH 22 (L) 07/22/2016    No results found for: PREALBUMIN CBC EXTENDED Latest Ref Rng & Units 11/19/2021 11/17/2021 11/10/2021  WBC 4.0 - 10.5 K/uL 13.0(H) 10.2 9.0  RBC 3.87 - 5.11 MIL/uL 3.80(L) 4.49 4.53  HGB 12.0 - 15.0 g/dL 86.5 78.4 69.6  HCT 29.5 - 46.0 % 38.6 45.4 45.2(H)  PLT 150 - 400 K/uL 225 336 260  NEUTROABS 1,500 - 7,800 cells/uL - - 4,995  LYMPHSABS 850 - 3,900 cells/uL - - 3,006     There is no height or weight on file to calculate BMI.  Orders:  No orders of the defined types were placed in this encounter.  No orders of the defined types were placed in this encounter.    Procedures: No procedures performed  Clinical Data: No additional findings.  ROS:  All other systems negative, except as noted in the HPI. Review of Systems  Objective: Vital Signs: There were no vitals taken for this visit.  Specialty Comments:  No specialty comments available.  PMFS History: Patient Active Problem List   Diagnosis Date Noted   PAD (peripheral artery disease) (HCC) 11/18/2021   Infected traumatic leg ulcer, left, with fat layer exposed (HCC) 11/11/2021   Infected wound 11/10/2021   Pain in right knee 01/07/2020   Carotid artery stenosis  12/03/2019   Migraine with aura and without status migrainosus, not intractable 11/28/2019   Hyperlipidemia associated with type 2 diabetes mellitus (HCC) 11/10/2019   Coronary artery calcification 08/05/2019   PAOD (peripheral arterial occlusive disease) (HCC) 02/26/2019   SOB (shortness of breath) 11/17/2018   Cardiomegaly 11/17/2018   Emphysema of lung (HCC) 10/10/2017   Atherosclerosis of aorta (HCC) 10/10/2017   Lung nodule 10/10/2017   Anxiety 08/22/2017   Arthritis 08/22/2017   Neuropathy due to secondary diabetes (HCC) 08/22/2017   PVD (peripheral vascular disease) (HCC) 04/13/2017   Type 2 diabetes mellitus with circulatory disorder, with long-term current use of insulin (HCC)  11/07/2015   Encounter for Medicare annual wellness exam 06/24/2015   Generalized anxiety disorder 03/26/2015   Depression, major, recurrent, in partial remission (HCC) 03/26/2015   History of CVA (cerebrovascular accident) 10/04/2014   Tobacco abuse 10/04/2014   Vitamin D deficiency 11/02/2013   Medication management 11/02/2013   Peripheral vascular disease due to secondary diabetes mellitus (HCC) 05/08/2012   Atherosclerosis of native arteries of extremity with intermittent claudication 12/24/2011   Diverticulosis of large intestine 06/09/2010   History of colonic polyps 06/09/2010   Esophageal reflux 08/27/2008   Nontoxic multinodular goiter 03/11/2008   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/11/2008   Brain aneurysm 03/11/2008   CKD stage 4 due to type 2 diabetes mellitus (HCC) 05/01/2007   Essential hypertension 05/01/2007   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,  "small"   Anxiety    Arthritis    Cataract    CKD (chronic kidney disease), stage IV (HCC)    followed by [facility]   Colon polyps    Diabetes mellitus 1998   Diverticulosis    Emphysema of lung (HCC)    "no one has evry told me that"   ESOPHAGEAL STRICTURE 08/27/2008   Family history of adverse reaction to anesthesia    daughter n/v   GERD (gastroesophageal reflux disease)    Hemorrhoids    Hiatal hernia    Hyperlipidemia    Hypertension 2000   Migraines    Neuropathy    fingers and toe   Peripheral vascular disease (HCC)    Phlebitis    30 years ago  left leg   Stroke [facility])    multiple mini strokes ( brain aneurysm ).  Right side weaker.    Family History  Problem Relation Age of Onset   CAD Mother 78       Died of MI   Hypertension Mother    Heart attack Mother    Heart disease Mother    CAD Father 32       Died of MI   Heart disease Father    Heart attack Father    CAD Brother 13        Two brothers died of MI   Heart attack Brother    Heart disease Brother        Amputation   Diabetes Sister    Hypertension Sister    Heart attack Brother    Heart disease Brother    Stroke Sister    Colon cancer Neg Hx    Stomach cancer Neg Hx    Esophageal cancer Neg Hx     Past Surgical History:  Procedure Laterality Date   ABDOMINAL AORTAGRAM N/A 01/04/2012   Procedure: ABDOMINAL Ronny Flurry;  Surgeon: Nada Libman, MD;  Location: [facility] CATH LAB;  Service: Cardiovascular;  Laterality: N/A;   ABDOMINAL AORTAGRAM N/A 08/15/2012   Procedure: ABDOMINAL Ronny Flurry;  Surgeon: Nada Libman, MD;  [facility] C[facility] LAB;  Service: Cardiovascular;  Laterality: N/A;   ABDOMINAL AORTOGRAM W/LOWER EXTREMITY Left 11/17/2021   Procedure: ABDOMINAL AORTOGRAM W/LOWER EXTREMITY;  Surgeon: Nada Libman, MD;  Location: [facility] INVASIVE CV LAB;  Service: Cardiovascular;  Laterality: Left;   ABDOMINAL HYSTERECTOMY  1984   cataract surgery Right 10-22-2015   cataract surgery Left 11-05-2015   CESAREAN SECTION     CHOLECYSTECTOMY     COLONOSCOPY  07/2010   DENTAL SURGERY  Aug. 16, 2013   left lower    ESOPHAGOGASTRODUODENOSCOPY  07/2010   EYE SURGERY  Nov. 2014   Laser-Glaucoma   FEMORAL ARTERY STENT  05/11/11   Left superficial femoral and popliteal artery   FEMORAL-POPLITEAL BYPASS GRAFT Left 11/18/2021   Procedure: LEFT FEMORAL-POPLITEAL ARTERY BYPASS GRAFT;  Surgeon: Nada Libman, MD;  Location: [facility] OR;  Service: Vascular;  Laterality: Left;   HERNIA REPAIR     times two   KNEE SURGERY     LOWER EXTREMITY ANGIOGRAM Left 08/15/2012   Procedure: LOWER EXTREMITY ANGIOGRAM;  Surgeon: Nada Libman, MD;  Location: [facility] CATH LAB;  Service: Cardiovascular;  Laterality: Left;  lt leg angio   rotator cuff surgery     THYROID SURGERY     Social History   Occupational History   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  Tobacco Use   Smoking status: Light Smoker    Packs/day: 0.40     Years: 40.00    Pack years: 16.00    Types: Cigarettes   Smokeless tobacco: Never   Tobacco comments:    11/18/20 in process of quiting  Vaping Use   Vaping Use: Never used  Substance and Sexual Activity   Alcohol use: No    Alcohol/week: 0.0 standard drinks   Drug use: Never   Sexual activity: Not on file

## 2021-12-11 ENCOUNTER — Other Ambulatory Visit: Payer: Self-pay | Admitting: *Deleted

## 2021-12-11 DIAGNOSIS — I70243 Atherosclerosis of 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: Secondary | ICD-10-CM

## 2021-12-11 NOTE — Progress Notes (Deleted)
ANNUAL WELLNESS VISIT AND FOLLOW UP ? ? ?Assessment:  ? ?Diagnoses and all orders for this visit: ? ?Annual Medicare Wellness Visit ?Due annually  ?Health maintenance reviewed ?*** ? ?Essential hypertension ?Continue current medications: amlodpine 46m,  olmesartan 449mdaily. ?Monitor blood pressure at home; call if consistently over 130/80 ?Continue DASH diet.   ?Reminder to go to the ER if any CP, SOB, nausea, dizziness, severe HA, changes vision/speech, left arm numbness and tingling and jaw pain. ?-     CBC with Differential/Platelet ?-     COMPLETE METABOLIC PANEL WITH GFR ?-     TSH ? ?Atherosclerosis of aorta (HCBeechmont?Per CT 06/19/20 ?Control blood pressure, lipids and glucose ?Disscused lifestyle modifications, diet & exercise ?Continue to monitor ? ?Peripheral vascular disease due to secondary diabetes mellitus (HCEast [place]?Control blood pressure, lipids and glucose ?Vascular *** ? ?PAOD (peripheral arterial occlusive disease) (HCCutten?Increase activity ? ?Type 2 diabetes mellitus with stage 4 chronic kidney disease, with long-term current use of insulin (HCLeesville?Continue medications follow s with Endocrinology ?Discussed general issues about diabetes pathophysiology and management. ?Education: Reviewed ?ABCs? of diabetes management (respective goals in parentheses):  A1C (<7), blood pressure (<130/80), and cholesterol (LDL <70) ?Dietary recommendations ?Encouraged aerobic exercise.  ?Discussed foot care, check daily ?Yearly retinal exam ?Dental exam every 6 months ?Monitor blood glucose, discussed goal for patient ? ?Hyperlipidemia associated with type 2 diabetes mellitus (HCWarwick?Continue medications:rosuvastatin 1048mDiscussed dietary and exercise modifications ?Low fat diet ?-     Lipid panel ? ?CKD stage 4 due to type 2 diabetes mellitus (HCCHaleIncrease fluids, have discussed this at length with patient ?Avoid NSAIDS ?Blood pressure control ?Monitor sugars, needs better glucose control ?Continue to follow with   [place] related to continually decline in kidney function ?-     Microalbumin / creatinine urine ratio ?-     Urinalysis, Routine w reflex microscopic ? ?Neuropathy due to secondary diabetes (HCCAberdeenControl glucose ? ? ?Depression, major, recurrent, in partial remission (HCCBerlinDiscussed stress management techniques  ?Discussed, increase water,intake & good sleep hygiene  ?Discussed increasing exercise & vegetables in diet ? ?Anxiety state ?Continue to monitor ? ?History of CVA (cerebrovascular accident) ?Control blood pressure, lipids and glucose ?Disscused lifestyle modifications, diet & exercise ?Continue to monitor *** ? ?Vitamin D deficiency ?-     VITAMIN D 25 Hydroxy (Vit-D Deficiency, Fractures) ? ? ?Medication management ?-Continued ? ?Pulmonary emphysema, unspecified emphysema type (HCCDaingerfieldFormer smoker; *** Currently not on inhalers and symptoms are currently controlled ? ?History of CVA (cerebrovascular accident) ?Control blood pressure, cholesterol, glucose, increase exercise.  ? ?Nontoxic multinodular goiter ?Monitor ? ?Restless leg syndrome ?Dr. LedWindy Kalataarted Clonazepam many years ago. ?Clonazepam was stopped and symptoms have worsened ?Ropinirole 0.2m71mnight *** ? ?Insomnia ?Tried Trazodone but was not tolerated. ? ?Seborrheic keratosis ?Continue to monitor, if starts to bother her will send to derm for removal ? ?History of colon polyps ?*** ? ?Migraine ?*** ? 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 ? ?Future Appointments  ?Date Time Provider [place]/28/2023 10:30 AM CorbLiane Comber GAAM-GAAIM None  ?01/04/2022 10:30 AM DudaNewt Minion [facility] None  ?01/11/2022 10:00 AM HochMinus Breeding [facility]/08/2022  1:00 PM MC-CV HS VASC 6 - MK MC-HCVI VVS  ?03/01/2022  2:00 PM MC-CV HS VASC 6 - MK MC-HCVI VVS  ?03/01/2022  2:30 PM VVS-[facility] PA V[facility] VVS  ?08/04/2022  2:00 PM  Magda Bernheim, NP GAAM-GAAIM None  ?' ? ?   ?Subjective:  ? ?[name] is a 76 y.o. female who presents for Medicare Annual Wellness Visit and 3 month follow up. She has Nontoxic multinodular goiter; CKD stage 4 due to type 2 diabetes mellitus ([place]); 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[place]); Essential hypertension; Brain aneurysm; Esophageal reflux; Diverticulosis of large intestine; History of colonic polyps; Atherosclerosis of native arteries of extremity with intermittent claudication; Peripheral vascular disease due to secondary diabetes mellitus ([place]); Vitamin D deficiency; Medication management; History of CVA (cerebrovascular accident); Tobacco abuse; Generalized anxiety disorder; Depression, major, recurrent, in partial remission ([place]); Encounter for Medicare annual wellness exam; Type 2 diabetes mellitus with circulatory disorder, with long-term current use of insulin ([place]); Emphysema of lung ([place]); Atherosclerosis of aorta ([place]); Lung nodule; Anxiety; Arthritis; Neuropathy due to secondary diabetes ([place]); SOB (shortness of breath); Cardiomegaly; PAOD (peripheral arterial occlusive disease) ([place]); Coronary artery calcification; Hyperlipidemia associated with type 2 diabetes mellitus ([place]); Migraine with aura and without status migrainosus, not intractable; Carotid artery stenosis; Pain in right knee; and 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 ? ? ?She has hx of PAD, Dr. Trula Slade follows. S/p stenting of her left superficial femoral artery in 2012 and developed early in-stent stenosis which was treated with angioplasty with subsequent occlusion. Recently developed poorly healing wound/ulceration to left ankle ***, on 11/18/2021 underwent left femoral to above-knee popliteal bypass graft for neovascularization and has followed with Dr. Sharol Given for wound *** ? ?Sees Dr. Leta Baptist yearly for previous CVA x 2, as well as several TIA.  ?Will have an occasional migraine, will lose vision and have  nausea. Will use 2 excedrin or Ibuprofen. ? ?She is a former smoker ***. 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 Needs to schedule appointment for follow up. *** ? ?She has depression/anxiety on *** ? ?BMI is There is no height or weight on file to calculate BMI., she {HAS HAS [id number]} been working on diet and exercise. Exercise is difficult due to PAD and neuropathy ?Wt Readings from Last 3 Encounters:  ?12/07/21 180 lb (81.6 kg)  ?11/18/21 184 lb (83.5 kg)  ?11/17/21 184 lb (83.5 kg)  ? ?Her blood pressure has been controlled at home(120's/70's), today their BP is    ?Exercise is difficult due to PAD and neuropathy. She denies chest pain, dyspnea, dizziness. *** ? ?She *** on cholesterol medication (atorvastatin 80 mg *** ) and denies ***. Last lipid was:  ?Lab Results  ?Component Value Date  ? CHOL 159 11/19/2021  ? HDL 32 (L) 11/19/2021  ? LDLCALC 102 (H) 11/19/2021  ? LDLDIRECT 146.4 01/30/2010  ? TRIG 127 11/19/2021  ? CHOLHDL 5.0 11/19/2021  ? ? ?She has several comorbidities from her DM including ?Neuropathy *** ?Retinopathy Dr. Kathlen Mody 04/2021- follow up 6 months ?Hyperlipidemia at goal less than 70, on crestor 40 mg ?CKD she is on ARB (off metformin due to CKDq) ? history of TIA she is on 325 ASA ?PAD s/p stents   ?Novolog 70/30 mix on 314 units before each meal ?BS running 87-139 ?Tresiba 40 units QAM ?She is on farxiga and tolerating it well ?she been working on diet and exercise for diabetes and following with Dr. Darnell Level ?denies polydipsia, polyuria and visual disturbances.  ?Last A1C in the office was:  ?Lab Results  ?Component Value Date  ? HGBA1C  7.1 (H) 11/17/2021  ?  ?She follows with Dr. Candiss Norse for CKD stage 4. Kidney functions have stayed stable.  ?Lab Results  ?Component Value Date  ? EGFR 29 (L) 11/10/2021  ? EGFR 27 (L) 08/04/2021  ? ? ?Patient is on Vitamin D. ?Lab Results  ?Component Value Date  ? VD25OH 21 (L)  08/04/2020  ? ? ? ?Medication Review ? ?Current Outpatient Medications (Endocrine & Metabolic):  ?  FARXIGA 10 MG TABS tablet, TAKE 1 TABLET BY MOUTH EVERY DAY BEFORE BREAKFAST. ?  insulin aspart (NOVOLOG FLEXPEN) 100 UN

## 2021-12-14 DIAGNOSIS — E1122 Type 2 diabetes mellitus with diabetic chronic kidney disease: Secondary | ICD-10-CM | POA: Diagnosis not present

## 2021-12-14 DIAGNOSIS — S81802D Unspecified open wound, left lower leg, subsequent encounter: Secondary | ICD-10-CM | POA: Diagnosis not present

## 2021-12-14 DIAGNOSIS — N184 Chronic kidney disease, stage 4 (severe): Secondary | ICD-10-CM | POA: Diagnosis not present

## 2021-12-14 DIAGNOSIS — I739 Peripheral vascular disease, unspecified: Secondary | ICD-10-CM | POA: Diagnosis not present

## 2021-12-14 DIAGNOSIS — Z48812 Encounter for surgical aftercare following surgery on the circulatory system: Secondary | ICD-10-CM | POA: Diagnosis not present

## 2021-12-14 DIAGNOSIS — I129 Hypertensive chronic kidney disease with stage 1 through stage 4 chronic kidney disease, or unspecified chronic kidney disease: Secondary | ICD-10-CM | POA: Diagnosis not present

## 2021-12-15 ENCOUNTER — Telehealth: Payer: Self-pay | Admitting: Orthopedic Surgery

## 2021-12-15 ENCOUNTER — Ambulatory Visit: Payer: Medicare Other | Admitting: Adult Health

## 2021-12-15 DIAGNOSIS — Z79899 Other long term (current) drug therapy: Secondary | ICD-10-CM

## 2021-12-15 DIAGNOSIS — E1122 Type 2 diabetes mellitus with diabetic chronic kidney disease: Secondary | ICD-10-CM

## 2021-12-15 DIAGNOSIS — Z48812 Encounter for surgical aftercare following surgery on the circulatory system: Secondary | ICD-10-CM | POA: Diagnosis not present

## 2021-12-15 DIAGNOSIS — I779 Disorder of arteries and arterioles, unspecified: Secondary | ICD-10-CM

## 2021-12-15 DIAGNOSIS — Z86010 Personal history of colonic polyps: Secondary | ICD-10-CM

## 2021-12-15 DIAGNOSIS — Z720 Tobacco use: Secondary | ICD-10-CM

## 2021-12-15 DIAGNOSIS — E1340 Other specified diabetes mellitus with diabetic neuropathy, unspecified: Secondary | ICD-10-CM

## 2021-12-15 DIAGNOSIS — I700 Atherosclerosis of aorta: Secondary | ICD-10-CM

## 2021-12-15 DIAGNOSIS — F411 Generalized anxiety disorder: Secondary | ICD-10-CM

## 2021-12-15 DIAGNOSIS — N184 Chronic kidney disease, stage 4 (severe): Secondary | ICD-10-CM | POA: Diagnosis not present

## 2021-12-15 DIAGNOSIS — F3341 Major depressive disorder, recurrent, in partial remission: Secondary | ICD-10-CM

## 2021-12-15 DIAGNOSIS — I70243 Atherosclerosis of 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: Secondary | ICD-10-CM

## 2021-12-15 DIAGNOSIS — I739 Peripheral vascular disease, unspecified: Secondary | ICD-10-CM | POA: Diagnosis not present

## 2021-12-15 DIAGNOSIS — I129 Hypertensive chronic kidney disease with stage 1 through stage 4 chronic kidney disease, or unspecified chronic kidney disease: Secondary | ICD-10-CM | POA: Diagnosis not present

## 2021-12-15 DIAGNOSIS — E1351 Other specified diabetes mellitus with diabetic peripheral angiopathy without gangrene: Secondary | ICD-10-CM

## 2021-12-15 DIAGNOSIS — E559 Vitamin D deficiency, unspecified: Secondary | ICD-10-CM

## 2021-12-15 DIAGNOSIS — E1159 Type 2 diabetes mellitus with other circulatory complications: Secondary | ICD-10-CM

## 2021-12-15 DIAGNOSIS — E1139 Type 2 diabetes mellitus with other diabetic ophthalmic complication: Secondary | ICD-10-CM

## 2021-12-15 DIAGNOSIS — I2510 Atherosclerotic heart disease of native coronary artery without angina pectoris: Secondary | ICD-10-CM

## 2021-12-15 DIAGNOSIS — Z0000 Encounter for general adult medical examination without abnormal findings: Secondary | ICD-10-CM

## 2021-12-15 DIAGNOSIS — J439 Emphysema, unspecified: Secondary | ICD-10-CM

## 2021-12-15 DIAGNOSIS — E1169 Type 2 diabetes mellitus with other specified complication: Secondary | ICD-10-CM

## 2021-12-15 DIAGNOSIS — G43109 Migraine with aura, not intractable, without status migrainosus: Secondary | ICD-10-CM

## 2021-12-15 DIAGNOSIS — F419 Anxiety disorder, unspecified: Secondary | ICD-10-CM

## 2021-12-15 DIAGNOSIS — S81802D Unspecified open wound, left lower leg, subsequent encounter: Secondary | ICD-10-CM | POA: Diagnosis not present

## 2021-12-15 DIAGNOSIS — I10 Essential (primary) hypertension: Secondary | ICD-10-CM

## 2021-12-15 DIAGNOSIS — Z8673 Personal history of transient ischemic attack (TIA), and cerebral infarction without residual deficits: Secondary | ICD-10-CM

## 2021-12-15 NOTE — Telephone Encounter (Signed)
[facility] called and states that pt has silver infusion stocking and they are causing pt a lot of discomfort. Wondering if theres something else she can use or possibly take a break from wearing it for so long.  ? ?EM [phone] ?

## 2021-12-15 NOTE — Telephone Encounter (Signed)
Pt is in a light compression XL knee high stocking. She was instructed to wear this around the clock and change daily. This is for her medial left calf ischemic ulcer. ?

## 2021-12-16 ENCOUNTER — Encounter: Payer: Self-pay | Admitting: Orthopedic Surgery

## 2021-12-16 NOTE — Telephone Encounter (Signed)
LMTCB, did not leave details on VM as it did not identify and I do not have a name from person calling from [facility]. ?

## 2021-12-17 NOTE — Telephone Encounter (Signed)
Pt informed

## 2021-12-22 DIAGNOSIS — Z792 Long term (current) use of antibiotics: Secondary | ICD-10-CM | POA: Diagnosis not present

## 2021-12-22 DIAGNOSIS — Z7982 Long term (current) use of aspirin: Secondary | ICD-10-CM | POA: Diagnosis not present

## 2021-12-22 DIAGNOSIS — S81802D Unspecified open wound, left lower leg, subsequent encounter: Secondary | ICD-10-CM | POA: Diagnosis not present

## 2021-12-22 DIAGNOSIS — N184 Chronic kidney disease, stage 4 (severe): Secondary | ICD-10-CM | POA: Diagnosis not present

## 2021-12-22 DIAGNOSIS — M1380 Other specified arthritis, unspecified site: Secondary | ICD-10-CM | POA: Diagnosis not present

## 2021-12-22 DIAGNOSIS — I739 Peripheral vascular disease, unspecified: Secondary | ICD-10-CM | POA: Diagnosis not present

## 2021-12-22 DIAGNOSIS — J439 Emphysema, unspecified: Secondary | ICD-10-CM | POA: Diagnosis not present

## 2021-12-22 DIAGNOSIS — Z794 Long term (current) use of insulin: Secondary | ICD-10-CM | POA: Diagnosis not present

## 2021-12-22 DIAGNOSIS — Z7984 Long term (current) use of oral hypoglycemic drugs: Secondary | ICD-10-CM | POA: Diagnosis not present

## 2021-12-22 DIAGNOSIS — E1122 Type 2 diabetes mellitus with diabetic chronic kidney disease: Secondary | ICD-10-CM | POA: Diagnosis not present

## 2021-12-22 DIAGNOSIS — Z48812 Encounter for surgical aftercare following surgery on the circulatory system: Secondary | ICD-10-CM | POA: Diagnosis not present

## 2021-12-22 DIAGNOSIS — I129 Hypertensive chronic kidney disease with stage 1 through stage 4 chronic kidney disease, or unspecified chronic kidney disease: Secondary | ICD-10-CM | POA: Diagnosis not present

## 2021-12-23 DIAGNOSIS — S81802D Unspecified open wound, left lower leg, subsequent encounter: Secondary | ICD-10-CM | POA: Diagnosis not present

## 2021-12-23 DIAGNOSIS — N184 Chronic kidney disease, stage 4 (severe): Secondary | ICD-10-CM | POA: Diagnosis not present

## 2021-12-23 DIAGNOSIS — Z48812 Encounter for surgical aftercare following surgery on the circulatory system: Secondary | ICD-10-CM | POA: Diagnosis not present

## 2021-12-23 DIAGNOSIS — I129 Hypertensive chronic kidney disease with stage 1 through stage 4 chronic kidney disease, or unspecified chronic kidney disease: Secondary | ICD-10-CM | POA: Diagnosis not present

## 2021-12-23 DIAGNOSIS — E1122 Type 2 diabetes mellitus with diabetic chronic kidney disease: Secondary | ICD-10-CM | POA: Diagnosis not present

## 2021-12-23 DIAGNOSIS — I739 Peripheral vascular disease, unspecified: Secondary | ICD-10-CM | POA: Diagnosis not present

## 2021-12-29 DIAGNOSIS — E1122 Type 2 diabetes mellitus with diabetic chronic kidney disease: Secondary | ICD-10-CM | POA: Diagnosis not present

## 2021-12-29 DIAGNOSIS — Z48812 Encounter for surgical aftercare following surgery on the circulatory system: Secondary | ICD-10-CM | POA: Diagnosis not present

## 2021-12-29 DIAGNOSIS — I129 Hypertensive chronic kidney disease with stage 1 through stage 4 chronic kidney disease, or unspecified chronic kidney disease: Secondary | ICD-10-CM | POA: Diagnosis not present

## 2021-12-29 DIAGNOSIS — S81802D Unspecified open wound, left lower leg, subsequent encounter: Secondary | ICD-10-CM | POA: Diagnosis not present

## 2021-12-29 DIAGNOSIS — N184 Chronic kidney disease, stage 4 (severe): Secondary | ICD-10-CM | POA: Diagnosis not present

## 2021-12-29 DIAGNOSIS — I739 Peripheral vascular disease, unspecified: Secondary | ICD-10-CM | POA: Diagnosis not present

## 2022-01-04 ENCOUNTER — Encounter: Payer: Self-pay | Admitting: Orthopedic Surgery

## 2022-01-04 ENCOUNTER — Ambulatory Visit (INDEPENDENT_AMBULATORY_CARE_PROVIDER_SITE_OTHER): Payer: Medicare Other | Admitting: Orthopedic Surgery

## 2022-01-04 DIAGNOSIS — I779 Disorder of arteries and arterioles, unspecified: Secondary | ICD-10-CM

## 2022-01-04 DIAGNOSIS — I87332 Chronic venous hypertension (idiopathic) with ulcer and inflammation of left lower extremity: Secondary | ICD-10-CM

## 2022-01-04 NOTE — Progress Notes (Signed)
? ?Office Visit Note ?  ?Patient: [name]           ?Date of Birth: 28-Mar-1946           ?MRN: [id number] ?Visit Date: 01/04/2022 ?             ?Requested by: [name], MD ?[address] ?[address] ?[place],  [place] [address] ?PCP: [name], MD ? ?Chief Complaint  ?Patient presents with  ? Left Leg - Wound Check  ? ? ? ? 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  She is status post revascularization.  She is wearing compression socks about 12 hours a day. ? ?Assessment & Plan: ?Visit Diagnoses:  ?1. Chronic venous hypertension (idiopathic) with ulcer and inflammation of left lower extremity ([place])   ? ? ?Plan: Continue with the compression socks during the day dry dressing at night. ? ?Follow-Up Instructions: Return in about 4 weeks (around 02/01/2022).  ? ?Ortho Exam ? ?Patient is alert, oriented, no adenopathy, well-dressed, normal affect, normal respiratory effort. ?Examination the medial left calf ulcer is 2 cm in diameter with 75% healthy granulation tissue she has a palpable dorsalis pedis pulse there is no redness no cellulitis no signs of infection. ? ?Imaging: ?No results found. ?No images are attached to the encounter. ? ?Labs: ?Lab Results  ?Component Value Date  ? HGBA1C 7.1 (H) 11/17/2021  ? HGBA1C 8.5 (H) 08/04/2021  ? HGBA1C 8.7 (A) 06/08/2021  ? ESRSEDRATE 11 06/06/2018  ? ESRSEDRATE 12 05/01/2009  ? LABURIC 4.6 04/18/2018  ? REPTSTATUS 07/25/2017 FINAL 07/23/2017  ? CULT >=100,000 COLONIES/mL ESCHERICHIA COLI (A) 07/23/2017  ? LABORGA ESCHERICHIA COLI (A) 07/23/2017  ? ? ? ?Lab Results  ?Component Value Date  ? ALBUMIN 3.2 (L) 11/18/2021  ? ALBUMIN 3.5 10/28/2020  ? ALBUMIN 3.0 (L) 01/27/2017  ? ? ?Lab Results  ?Component Value Date  ? MG 2.2 08/04/2021  ? MG 2.5 08/04/2020  ? MG 1.9 11/13/2018  ? ?Lab Results  ?Component Value Date  ? VD25OH 21 (L) 08/04/2020  ? VD25OH 27 (L) 04/18/2018  ?  VD25OH 22 (L) 07/22/2016  ? ? ?No results found for: PREALBUMIN ? ?  Latest Ref Rng & Units 11/19/2021  ?  1:54 AM 11/17/2021  ?  1:42 PM 11/10/2021  ?  3:39 PM  ?CBC EXTENDED  ?WBC 4.0 - 10.5 K/uL 13.0   10.2   9.0    ?RBC 3.87 - 5.11 MIL/uL 3.80   4.49   4.53    ?Hemoglobin 12.0 - 15.0 g/dL 12.6   15.0   15.1    ?HCT 36.0 - 46.0 % 38.6   45.4   45.2    ?Platelets 150 - 400 K/uL 225   336   260    ?NEUT# 1,500 - 7,800 cells/uL   4,995    ?Lymph# 850 - 3,900 cells/uL   3,006    ? ? ? ?There is no height or weight on file to calculate BMI. ? ?Orders:  ?No orders of the defined types were placed in this encounter. ? ?No orders of the defined types were placed in this encounter. ? ? ? Procedures: ?No procedures performed ? ?Clinical Data: ?No additional findings. ? ?ROS: ? ?All other systems negative, except as noted in the HPI. ?Review of Systems ? ?Objective: ?Vital Signs: There were no vitals taken for this visit. ? ?Specialty Comments:  ?No specialty  comments available. ? ?PMFS History: ?Patient Active Problem List  ? Diagnosis Date Noted  ? 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 Pain in right knee 01/07/2020  ? Carotid artery stenosis 12/03/2019  ? Migraine with aura and without status migrainosus, not intractable 11/28/2019  ? Hyperlipidemia associated with type 2 diabetes mellitus ([place]) 11/10/2019  ? Coronary artery calcification 08/05/2019  ? PAOD (peripheral arterial occlusive disease) ([place]) 02/26/2019  ? SOB (shortness of breath) 11/17/2018  ? Emphysema of lung ([place]) 10/10/2017  ? Atherosclerosis of aorta ([place]) 10/10/2017  ? Lung nodule 10/10/2017  ? Anxiety 08/22/2017  ? Arthritis 08/22/2017  ? Neuropathy due to secondary diabetes ([place]) 08/22/2017  ? Type 2 diabetes mellitus with circulatory disorder, with long-term current use of insulin ([place]) 11/07/2015  ? Encounter for Medicare annual wellness exam 06/24/2015  ? Generalized anxiety disorder 03/26/2015  ?  Depression, major, recurrent, in partial remission ([place]) 03/26/2015  ? History of CVA (cerebrovascular accident) 10/04/2014  ? Tobacco abuse 10/04/2014  ? Vitamin D deficiency 11/02/2013  ? Medication management 11/02/2013  ? 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 Atherosclerosis of native arteries of extremity with intermittent claudication 12/24/2011  ? Diverticulosis of large intestine 06/09/2010  ? History of colonic polyps 06/09/2010  ? Esophageal reflux 08/27/2008  ? Nontoxic multinodular goiter 03/11/2008  ? 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[place]) 03/11/2008  ? Brain aneurysm 03/11/2008  ? CKD stage 4 due to type 2 diabetes mellitus ([place]) 05/01/2007  ? Essential hypertension 05/01/2007  ? ?Past Medical History:  ?Diagnosis Date  ? Abnormal findings on esophagogastroduodenoscopy (EGD) 07/2010  ? Aneurysm ([place]) 2003  ? in brain x 2,  "small"  ? Anxiety   ? Arthritis   ? Cataract   ? CKD (chronic kidney disease), stage IV ([place])   ? followed by [facility]  ? Colon polyps   ? Diabetes mellitus 1998  ? Diverticulosis   ? Emphysema of lung ([place])   ? "no one has evry told me that"  ? ESOPHAGEAL STRICTURE 08/27/2008  ? Family history of adverse reaction to anesthesia   ? daughter n/v  ? GERD (gastroesophageal reflux disease)   ? Hemorrhoids   ? Hiatal hernia   ? Hyperlipidemia   ? Hypertension 2000  ? Migraines   ? Neuropathy   ? fingers and toe  ? Peripheral vascular disease ([place])   ? Phlebitis   ? 30 years ago  left leg  ? [facility]roke [facility])   ? multiple mini strokes ( brain aneurysm ).  Right side weaker.  ?  ?Family History  ?Problem Relation Age of Onset  ? CAD Mother 12  ?     Died of MI  ? Hypertension Mother   ? Heart attack Mother   ? Heart disease Mother   ? CAD Father 52  ?     Died of MI  ? Heart disease Father   ? Heart attack Father   ? CAD Brother 74  ?     Two brothers died of MI  ? Heart attack Brother   ? Heart disease Brother   ?     Amputation  ? Diabetes  Sister   ? Hypertension Sister   ? Heart attack Brother   ? Heart disease Brother   ? Stroke Sister   ? Colon cancer Neg Hx   ? Stomach cancer Neg Hx   ? Esophageal cancer Neg  Hx   ?  ?Past Surgical History:  ?Procedure Laterality Date  ? ABDOMINAL AORTAGRAM N/A 01/04/2012  ? Procedure: ABDOMINAL AORTAGRAM;  Surgeon: Serafina Mitchell, MD;  [facility];  Service: Cardiovascular;  Laterality: N/A;  ? ABDOMINAL AORTAGRAM N/A 08/15/2012  ? Procedure: ABDOMINAL AORTAGRAM;  Surgeon: Serafina Mitchell, MD;  Location: [facility] CATH LAB;  Service: Cardiovascular;  Laterality: N/A;  ? ABDOMINAL AORTOGRAM W/LOWER EXTREMITY Left 11/17/2021  ? Procedure: ABDOMINAL AORTOGRAM W/LOWER EXTREMITY;  Surgeon: Serafina Mitchell, MD;  Location: [place] CV LAB;  Service: Cardiovascular;  Laterality: Left;  ? ABDOMINAL HYSTERECTOMY  1984  ? cataract surgery Right 10-22-2015  ? cataract surgery Left 11-05-2015  ? CESAREAN SECTION    ? CHOLECYSTECTOMY    ? COLONOSCOPY  07/2010  ? DENTAL SURGERY  Aug. 16, 2013  ? left lower   ? ESOPHAGOGASTRODUODENOSCOPY  07/2010  ? EYE SURGERY  Nov. 2014  ? Laser-Glaucoma  ? FEMORAL ARTERY STENT  05/11/11  ? Left superficial femoral and popliteal artery  ? FEMORAL-POPLITEAL BYPASS GRAFT Left 11/18/2021  ? Procedure: LEFT FEMORAL-POPLITEAL ARTERY BYPASS GRAFT;  Surgeon: Serafina Mitchell, MD;  Location: [facility];  Service: Vascular;  Laterality: Left;  ? HERNIA REPAIR    ? times two  ? KNEE SURGERY    ? LOWER EXTREMITY ANGIOGRAM Left 08/15/2012  ? Procedure: LOWER EXTREMITY ANGIOGRAM;  Surgeon: Serafina Mitchell, MD;  Location[facility] CATH LAB;  Service: Cardiovascular;  Laterality: Left;  lt leg angio  ? rotator cuff surgery    ? THYROID SURGERY    ? ?Social History  ? ?Occupational History  ? 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  ?Tobacco Use  ? Smoking status: Light Smoker  ?  Packs/day: 0.40  ?  Years: 40.00  ?  Pack years: 16.00  ?  Types: Cigarettes  ? Smokeless tobacco: Never  ? Tobacco comments:   ?  11/18/20 in process of quiting  ?Vaping Use  ? Vaping Use: Never used  ?Substance and Sexual Activity  ? Alcohol use: No  ?  Alcohol/week: 0.0 standard drinks  ? Drug use: Never  ? Sexual activity: Not

## 2022-01-05 DIAGNOSIS — Z48812 Encounter for surgical aftercare following surgery on the circulatory system: Secondary | ICD-10-CM | POA: Diagnosis not present

## 2022-01-05 DIAGNOSIS — I129 Hypertensive chronic kidney disease with stage 1 through stage 4 chronic kidney disease, or unspecified chronic kidney disease: Secondary | ICD-10-CM | POA: Diagnosis not present

## 2022-01-05 DIAGNOSIS — N184 Chronic kidney disease, stage 4 (severe): Secondary | ICD-10-CM | POA: Diagnosis not present

## 2022-01-05 DIAGNOSIS — S81802D Unspecified open wound, left lower leg, subsequent encounter: Secondary | ICD-10-CM | POA: Diagnosis not present

## 2022-01-05 DIAGNOSIS — E1122 Type 2 diabetes mellitus with diabetic chronic kidney disease: Secondary | ICD-10-CM | POA: Diagnosis not present

## 2022-01-05 DIAGNOSIS — I739 Peripheral vascular disease, unspecified: Secondary | ICD-10-CM | POA: Diagnosis not present

## 2022-01-06 ENCOUNTER — Other Ambulatory Visit: Payer: Self-pay | Admitting: Internal Medicine

## 2022-01-06 DIAGNOSIS — E1122 Type 2 diabetes mellitus with diabetic chronic kidney disease: Secondary | ICD-10-CM | POA: Diagnosis not present

## 2022-01-06 DIAGNOSIS — I129 Hypertensive chronic kidney disease with stage 1 through stage 4 chronic kidney disease, or unspecified chronic kidney disease: Secondary | ICD-10-CM | POA: Diagnosis not present

## 2022-01-06 DIAGNOSIS — Z48812 Encounter for surgical aftercare following surgery on the circulatory system: Secondary | ICD-10-CM | POA: Diagnosis not present

## 2022-01-06 DIAGNOSIS — N184 Chronic kidney disease, stage 4 (severe): Secondary | ICD-10-CM | POA: Diagnosis not present

## 2022-01-06 DIAGNOSIS — Z794 Long term (current) use of insulin: Secondary | ICD-10-CM

## 2022-01-06 DIAGNOSIS — I739 Peripheral vascular disease, unspecified: Secondary | ICD-10-CM | POA: Diagnosis not present

## 2022-01-06 DIAGNOSIS — S81802D Unspecified open wound, left lower leg, subsequent encounter: Secondary | ICD-10-CM | POA: Diagnosis not present

## 2022-01-08 ENCOUNTER — Other Ambulatory Visit: Payer: Self-pay | Admitting: Internal Medicine

## 2022-01-08 DIAGNOSIS — E1122 Type 2 diabetes mellitus with diabetic chronic kidney disease: Secondary | ICD-10-CM

## 2022-01-08 NOTE — Telephone Encounter (Signed)
She needs an appointment within the next 3 months for further refills. ?

## 2022-01-10 DIAGNOSIS — I739 Peripheral vascular disease, unspecified: Secondary | ICD-10-CM | POA: Insufficient documentation

## 2022-01-10 NOTE — Progress Notes (Signed)
?  ?Cardiology Office Note ? ? ?Date:  01/11/2022  ? ?ID:  [name], DOB 1945/10/19, MRN [id number] ? ?PCP:  [name], MD  ?Cardiologist:   Minus Breeding, MD ? ? ?Chief Complaint  ?Patient presents with  ? Aortic atherosclerosis  ? ? ?  ?History of Present Illness: ?[name] is a 76 y.o. female who presents for follow up of PVD.  She has had  peripheral vascular disease as below with stenting.  I last saw her in 2021.  She saw Laurann Montana NP in Feb.   She has known aortic atherosclerosis and was to have an echo as she was concerned given this and her family history.  I don't see that this was done.    She had left fem below the knee pop in March with Dr. Trula Slade.   ? ?She says she is doing well.  She does have a bit of a nonhealing wound that is slowly getting better.  She has been working with physical therapy.  She still does not have quite normal range of motion in her leg.  She denies any chest discomfort, neck or arm discomfort.  She has no new shortness of breath, PND or orthopnea.  She had no palpitations, presyncope or syncope.  Has had no weight gain or edema. ? ? ?Past Medical History:  ?Diagnosis Date  ? Abnormal findings on esophagogastroduodenoscopy (EGD) 07/2010  ? 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 Anxiety   ? Arthritis   ? Cataract   ? CKD (chronic kidney disease), stage IV ([place])   ? followed by [facility]  ? Colon polyps   ? Diabetes mellitus 1998  ? Diverticulosis   ? Emphysema of lung ([place])   ? "no one has evry told me that"  ? ESOPHAGEAL STRICTURE 08/27/2008  ? Family history of adverse reaction to anesthesia   ? daughter n/v  ? GERD (gastroesophageal reflux disease)   ? Hemorrhoids   ? Hiatal hernia   ? Hyperlipidemia   ? Hypertension 2000  ? Migraines   ? Neuropathy   ? fingers and toe  ? Peripheral vascular disease ([place])   ? Phlebitis   ? 30 years ago  left leg  ? Stroke [facility])   ? multiple mini strokes ( brain aneurysm ).  Right side weaker.  ? ? ?Past  Surgical History:  ?Procedure Laterality Date  ? ABDOMINAL AORTAGRAM N/A 01/04/2012  ? Procedure: ABDOMINAL AORTAGRAM;  Surgeon: Serafina Mitchell, MD;  Location: [facility] CATH LAB;  Service: Cardiovascular;  Laterality: N/A;  ? ABDOMINAL AORTAGRAM N/A 08/15/2012  ? Procedure: ABDOMINAL AORTAGRAM;  Surgeon: Serafina Mitchell, MD;  Location: [facility] CATH LAB;  Service: Cardiovascular;  Laterality: N/A;  ? ABDOMINAL AORTOGRAM W/LOWER EXTREMITY Left 11/17/2021  ? Procedure: ABDOMINAL AORTOGRAM W/LOWER EXTREMITY;  Surgeon: Serafina Mitchell, MD;  Location: [facility] CV LAB;  Service: Cardiovascular;  Laterality: Left;  ? ABDOMINAL HYSTERECTOMY  1984  ? cataract surgery Right 10-22-2015  ? cataract surgery Left 11-05-2015  ? CESAREAN SECTION    ? CHOLECYSTECTOMY    ? COLONOSCOPY  07/2010  ? DENTAL SURGERY  Aug. 16, 2013  ? left lower   ? ESOPHAGOGASTRODUODENOSCOPY  07/2010  ? EYE SURGERY  Nov. 2014  ? Laser-Glaucoma  ? FEMORAL ARTERY STENT  05/11/11  ? Left superficial femoral and popliteal artery  ? FEMORAL-POPLITEAL BYPASS GRAFT Left 11/18/2021  ? Procedure: LEFT FEMORAL-POPLITEAL ARTERY BYPASS GRAFT;  Surgeon: Serafina Mitchell, MD;  Location: [facility] OR;  Service: Vascular;  Laterality: Left;  ? HERNIA REPAIR    ? times two  ? KNEE SURGERY    ? LOWER EXTREMITY ANGIOGRAM Left 08/15/2012  ? Procedure: LOWER EXTREMITY ANGIOGRAM;  Surgeon: Serafina Mitchell, MD;  Location: [facility] CATH LAB;  Service: Cardiovascular;  Laterality: Left;  lt leg angio  ? rotator cuff surgery    ? THYROID SURGERY    ? ? ? ?Current Outpatient Medications  ?Medication Sig Dispense Refill  ? amLODipine (NORVASC) 10 MG tablet TAKE 1 TABLET DAILY FOR BLOOD PRESSURE 90 tablet 3  ? aspirin 325 MG EC tablet Take 325 mg by mouth at bedtime.    ? atorvastatin (LIPITOR) 80 MG tablet Take 1 tablet (80 mg total) by mouth daily. 30 tablet 2  ? b complex vitamins capsule Take 2 capsules by mouth in the morning and at bedtime.    ? Blood Glucose Monitoring Suppl (ONETOUCH VERIO FLEX  SYSTEM) w/Device KIT USE AS ADVISED 1 kit 0  ? Carboxymethylcellulose Sodium (THERATEARS) 0.25 % SOLN Place 1 drop into both eyes daily as needed (dry eyes).    ? chlorthalidone (HYGROTON) 25 MG tablet Take 25 mg by mouth every other day. At night    ? Coenzyme Q10 (COQ10) 100 MG CAPS Take 100 mg by mouth daily.    ? FARXIGA 10 MG TABS tablet TAKE 1 TABLET BY MOUTH EVERY DAY BEFORE BREAKFAST. 90 tablet 0  ? glucose blood test strip Use as instructed 3x a day 200 each 12  ? insulin aspart (NOVOLOG FLEXPEN) 100 UNIT/ML FlexPen 10-14 Units 3 (three) times daily with meals.    ? insulin degludec (TRESIBA FLEXTOUCH) 200 UNIT/ML FlexTouch Pen Inject 40 Units into the skin daily. 9 mL 0  ? Insulin Pen Needle 29G X 5MM MISC Use with insulin pen 3 times a day. 300 each 11  ? meclizine (ANTIVERT) 25 MG tablet 1/2 tab up to 3 times daily for motion sickness/dizziness. (Patient taking differently: Take 12.5 mg by mouth 3 (three) times daily as needed for dizziness. 1/2 tab up to 3 times daily for motion sickness/dizziness.) 30 tablet 0  ? olmesartan (BENICAR) 40 MG tablet TAKE 1 TABLET DAILY FOR BLOOD PRESSURE 90 tablet 3  ? Omega-3 Fatty Acids (FISH OIL) 1200 MG CAPS Take 1 capsule (1,200 mg total) by mouth daily.    ? omeprazole (PRILOSEC) 40 MG capsule Take 1 capsule (40 mg total) by mouth in the morning and at bedtime. (Patient taking differently: Take 40 mg by mouth at bedtime.) 180 capsule 3  ? OneTouch Delica Lancets 45W MISC Use 3x a day 200 each 3  ? OVER THE COUNTER MEDICATION Take 2 tablets by mouth 3 (three) times daily before meals. Gluco Revolve    ? OVER THE COUNTER MEDICATION Take 1 capsule by mouth daily. Brain Booster    ? rOPINIRole (REQUIP) 0.25 MG tablet Take 1 tablet (0.25 mg total) by mouth at bedtime. 90 tablet 2  ? oxyCODONE-acetaminophen (PERCOCET/ROXICET) 5-325 MG tablet Take 1 tablet by mouth every 6 (six) hours as needed for moderate pain. 20 tablet 0  ? sulfamethoxazole-trimethoprim (BACTRIM)  400-80 MG tablet Take 1 tablet by mouth every 12 (twelve) hours. 28 tablet 0  ? ?No current facility-administered medications for this visit.  ? ? ?Allergies:   Ciprofloxacin, Ozempic (0.25 or 0.5 mg-dose) [semaglutide(0.25 or 0.5mg -dos)], Plavix [clopidogrel bisulfate], Amoxicillin, Ace inhibitors, Lyrica [pregabalin], and Penicillins  ? ? ?ROS:  Please see the history of present illness.   Otherwise, review of systems are positive for none.   All other systems are reviewed and negative.  ? ? ?PHYSICAL EXAM:bil ?VS:  BP (!) 122/58   Pulse 68   Ht 5' 6"  (1.676 m)   Wt 181 lb (82.1 kg)   SpO2 95%   BMI 29.21 kg/m?  , BMI Body mass index is 29.21 kg/m?. ?GENERAL:  Well appearing ?NECK:  No jugular venous distention, waveform within normal limits, carotid upstroke brisk and symmetric, bilateral right greater than left bruits, no thyromegaly ?LUNGS:  Clear to auscultation bilaterally ?CHEST:  Unremarkable ?HEART:  PMI not displaced or sustained,S1 and S2 within normal limits, no S3, no S4, no clicks, no rubs, no murmurs ?ABD:  Flat, positive bowel sounds normal in frequency in pitch, no bruits, no rebound, no guarding, no midline pulsatile mass, no hepatomegaly, no splenomegaly ?EXT:  2 plus pulses upper, absent dorsalis pedis and posterior tibialis bilateral, no edema, no cyanosis no clubbing ? ? ?EKG:  EKG is not ordered today. ? ? ? ?Recent Labs: ?08/04/2021: Magnesium 2.2; TSH 1.60 ?11/18/2021: ALT 18 ?11/19/2021: Hemoglobin 12.6; Platelets 225 ?11/20/2021: BUN 33; Creatinine, Ser 1.98; Potassium 4.2; Sodium 137  ? ? ?Lipid Panel ?   ?Component Value Date/Time  ? CHOL 159 11/19/2021 0154  ? TRIG 127 11/19/2021 0154  ? HDL 32 (L) 11/19/2021 0154  ? CHOLHDL 5.0 11/19/2021 0154  ? VLDL 25 11/19/2021 0154  ? [place] 102 (H) 11/19/2021 0154  ? [place] 65 08/04/2021 1516  ? LDLDIRECT 146.4 01/30/2010 1056  ? ?  ? ?Wt Readings from Last 3 Encounters:  ?01/11/22 181 lb (82.1 kg)  ?12/07/21 180 lb (81.6 kg)  ?11/18/21 184 lb  (83.5 kg)  ?  ? ? ?Other studies Reviewed: ?Additional studies/ records that were reviewed today include: VVS and hospital records. ?Review of the above records demonstrates:  Please see elsewhere in the note.   ?

## 2022-01-11 ENCOUNTER — Ambulatory Visit (INDEPENDENT_AMBULATORY_CARE_PROVIDER_SITE_OTHER): Payer: Medicare Other | Admitting: Cardiology

## 2022-01-11 ENCOUNTER — Encounter: Payer: Self-pay | Admitting: Cardiology

## 2022-01-11 VITALS — BP 122/58 | HR 68 | Ht 66.0 in | Wt 181.0 lb

## 2022-01-11 DIAGNOSIS — I779 Disorder of arteries and arterioles, unspecified: Secondary | ICD-10-CM | POA: Diagnosis not present

## 2022-01-11 DIAGNOSIS — Z720 Tobacco use: Secondary | ICD-10-CM

## 2022-01-11 DIAGNOSIS — I739 Peripheral vascular disease, unspecified: Secondary | ICD-10-CM | POA: Diagnosis not present

## 2022-01-11 DIAGNOSIS — R931 Abnormal findings on diagnostic imaging of heart and coronary circulation: Secondary | ICD-10-CM | POA: Diagnosis not present

## 2022-01-11 DIAGNOSIS — E118 Type 2 diabetes mellitus with unspecified complications: Secondary | ICD-10-CM

## 2022-01-11 DIAGNOSIS — I700 Atherosclerosis of aorta: Secondary | ICD-10-CM | POA: Diagnosis not present

## 2022-01-11 NOTE — Patient Instructions (Addendum)
Medication Instructions:  ?Your Physician recommend you continue on your current medication as directed.   ? ?*If you need a refill on your cardiac medications before your next appointment, please call your pharmacy* ? ? ?Lab Work: ?None ordered today ? ? ?Testing/Procedures: ?None ordered today ? ? ?Follow-Up: ?At [facility], you and your health needs are our priority.  As part of our continuing mission to provide you with exceptional heart care, we have created designated Provider Care Teams.  These Care Teams include your primary Cardiologist (physician) and Advanced Practice Providers (APPs -  Physician Assistants and Nurse Practitioners) who all work together to provide you with the care you need, when you need it. ? ?We recommend signing up for the patient portal called "MyChart".  Sign up information is provided on this After Visit Summary.  MyChart is used to connect with patients for Virtual Visits (Telemedicine).  Patients are able to view lab/test results, encounter notes, upcoming appointments, etc.  Non-urgent messages can be sent to your provider as well.   ?To learn more about what you can do with MyChart, go to [email or web address].   ? ?Your next appointment:   ?1 year(s) ? ?The format for your next appointment:   ?In Person ? ?Provider:   ?Minus Breeding, MD { ? ?Contact [facility] at  [phone] regarding scheduling CT Chest ordered by Dr. Kipp Brood ? ?Important Information About Sugar ? ? ? ? ? ? ?

## 2022-01-12 DIAGNOSIS — Z48812 Encounter for surgical aftercare following surgery on the circulatory system: Secondary | ICD-10-CM | POA: Diagnosis not present

## 2022-01-12 DIAGNOSIS — I739 Peripheral vascular disease, unspecified: Secondary | ICD-10-CM | POA: Diagnosis not present

## 2022-01-12 DIAGNOSIS — I129 Hypertensive chronic kidney disease with stage 1 through stage 4 chronic kidney disease, or unspecified chronic kidney disease: Secondary | ICD-10-CM | POA: Diagnosis not present

## 2022-01-12 DIAGNOSIS — N184 Chronic kidney disease, stage 4 (severe): Secondary | ICD-10-CM | POA: Diagnosis not present

## 2022-01-12 DIAGNOSIS — S81802D Unspecified open wound, left lower leg, subsequent encounter: Secondary | ICD-10-CM | POA: Diagnosis not present

## 2022-01-12 DIAGNOSIS — E1122 Type 2 diabetes mellitus with diabetic chronic kidney disease: Secondary | ICD-10-CM | POA: Diagnosis not present

## 2022-01-19 ENCOUNTER — Telehealth: Payer: Self-pay

## 2022-01-19 ENCOUNTER — Other Ambulatory Visit: Payer: Self-pay | Admitting: Internal Medicine

## 2022-01-19 DIAGNOSIS — E1122 Type 2 diabetes mellitus with diabetic chronic kidney disease: Secondary | ICD-10-CM | POA: Diagnosis not present

## 2022-01-19 DIAGNOSIS — I739 Peripheral vascular disease, unspecified: Secondary | ICD-10-CM | POA: Diagnosis not present

## 2022-01-19 DIAGNOSIS — I129 Hypertensive chronic kidney disease with stage 1 through stage 4 chronic kidney disease, or unspecified chronic kidney disease: Secondary | ICD-10-CM | POA: Diagnosis not present

## 2022-01-19 DIAGNOSIS — Z48812 Encounter for surgical aftercare following surgery on the circulatory system: Secondary | ICD-10-CM | POA: Diagnosis not present

## 2022-01-19 DIAGNOSIS — S81802D Unspecified open wound, left lower leg, subsequent encounter: Secondary | ICD-10-CM | POA: Diagnosis not present

## 2022-01-19 DIAGNOSIS — N184 Chronic kidney disease, stage 4 (severe): Secondary | ICD-10-CM | POA: Diagnosis not present

## 2022-01-19 NOTE — Telephone Encounter (Signed)
90 days supply sent to preferred pharmacy. ?

## 2022-01-19 NOTE — Telephone Encounter (Signed)
Pt lvm requested a 90 day rx for Tresiba be sent to her preferred pharmacy. 30 day rx was recently sent 01/08/22. Pt has appt 01/22/22. ?

## 2022-01-19 NOTE — Telephone Encounter (Signed)
OK to send the 90 day supply. ?

## 2022-01-22 ENCOUNTER — Ambulatory Visit (INDEPENDENT_AMBULATORY_CARE_PROVIDER_SITE_OTHER): Payer: Medicare Other | Admitting: Internal Medicine

## 2022-01-22 ENCOUNTER — Encounter: Payer: Self-pay | Admitting: Internal Medicine

## 2022-01-22 VITALS — BP 138/80 | HR 67 | Ht 66.0 in | Wt 183.6 lb

## 2022-01-22 DIAGNOSIS — I779 Disorder of arteries and arterioles, unspecified: Secondary | ICD-10-CM

## 2022-01-22 DIAGNOSIS — N184 Chronic kidney disease, stage 4 (severe): Secondary | ICD-10-CM

## 2022-01-22 DIAGNOSIS — E1169 Type 2 diabetes mellitus with other specified complication: Secondary | ICD-10-CM

## 2022-01-22 DIAGNOSIS — E669 Obesity, unspecified: Secondary | ICD-10-CM | POA: Diagnosis not present

## 2022-01-22 DIAGNOSIS — E1122 Type 2 diabetes mellitus with diabetic chronic kidney disease: Secondary | ICD-10-CM | POA: Diagnosis not present

## 2022-01-22 DIAGNOSIS — Z794 Long term (current) use of insulin: Secondary | ICD-10-CM

## 2022-01-22 DIAGNOSIS — E785 Hyperlipidemia, unspecified: Secondary | ICD-10-CM

## 2022-01-22 LAB — POCT GLYCOSYLATED HEMOGLOBIN (HGB A1C): Hemoglobin A1C: 7.6 % — AB (ref 4.0–5.6)

## 2022-01-22 LAB — LIPID PANEL
Cholesterol: 141 mg/dL (ref 0–200)
HDL: 43.5 mg/dL (ref >39.00)
LDL Cholesterol: 72 mg/dL (ref 0–99)
NonHDL: 97.54
Total CHOL/HDL Ratio: 3
Triglycerides: 129 mg/dL (ref 0.0–149.0)
VLDL: 25.8 mg/dL (ref 0.0–40.0)

## 2022-01-22 MED ORDER — DAPAGLIFLOZIN PROPANEDIOL 10 MG PO TABS
ORAL_TABLET | ORAL | 1 refills | Status: DC
Start: 2022-01-22 — End: 2022-05-31

## 2022-01-22 MED ORDER — NOVOLOG FLEXPEN 100 UNIT/ML ~~LOC~~ SOPN
10.0000 [IU] | PEN_INJECTOR | Freq: Three times a day (TID) | SUBCUTANEOUS | 1 refills | Status: DC
Start: 2022-01-22 — End: 2022-05-31

## 2022-01-22 MED ORDER — TRESIBA FLEXTOUCH 200 UNIT/ML ~~LOC~~ SOPN: 40.0000 [IU] | PEN_INJECTOR | Freq: Every day | SUBCUTANEOUS | 1 refills | Status: DC

## 2022-01-22 NOTE — Progress Notes (Addendum)
Patient ID: [name], female   DOB: 1946-06-17, 76 y.o.   MRN: [id number]  ? ?This visit occurred during the SARS-CoV-2 public health emergency.  Safety protocols were in place, including screening questions prior to the visit, additional usage of staff PPE, and extensive cleaning of exam room while observing appropriate contact time as indicated for disinfecting solutions.  ? ?HPI: ?[name] is a 76 y.o.-year-old female, initially referred by her cardiologist, Dr.Hochrein, presenting for follow-up for DM2, dx in ~2000, insulin-dependent since ~2005, uncontrolled, with complications (PVD - s/p stents and fem-pop bypass; h/o CVA x3; CKD stage 3b; DR; PN). She saw my colleague, Dr. Loanne Drilling many years ago.  Last visit with me 8 months ago. ?PCP: [name], PA ? ?Interim history: ?No increased urination, blurry vision, nausea, diarrhea, constipation, chest pain. ?Since last visit, she had an infected ischemic ulcer due to arterial insufficiency after injury to her left foot in 09/2021.  She had left femoropopliteal bypass graft 11/18/2021. ?She started to take a supplement and vitamins in 09/2021 (by [facility] [place]) - sugars started to improve afterwards.  She continues on the supplements. ? ?Reviewed HbA1c levels: ?Lab Results  ?Component Value Date  ? HGBA1C 7.1 (H) 11/17/2021  ? HGBA1C 8.5 (H) 08/04/2021  ? HGBA1C 8.7 (A) 06/08/2021  ? HGBA1C 8.4 (H) 12/15/2020  ? HGBA1C 8.7 (A) 09/22/2020  ? HGBA1C 9.6 (A) 05/21/2020  ? HGBA1C 9.8 (A) 01/14/2020  ? HGBA1C 9.1 (A) 07/09/2019  ? HGBA1C 10.9 (H) 11/13/2018  ? HGBA1C 9.7 (H) 07/19/2018  ?01/26/2021: HbA1c calculated from fructosamine is 7.47%, higher. ?09/22/2020: HbA1c calculated from fructosamine is much better than the directly measured HbA1c, at 6.25% ?05/21/2020: HbA1c calculated from fructosamine 7.3%, much better than the directly measured one, and stable from before ?01/14/2020: HbA1c calculated from fructosamine 7.3% ?10/08/2019:  HbA1c calculated from fructosamine: 6.9% ? ?She was previously on: ?- Novolog 70/30 35 units before breakfast and 30 units before dinner- injecting hips and arms due to abdominal pain ?- Farxiga 5 mg in am - started 06/2019 >> 10 mg daily - increased 09/2019 ? ?Currently on: ?- Farxiga 10 mg daily before b'fast ?- Tresiba 40 units daily ?- Novolog: ?10-14 units 2x day before meals ?She could not tolerate Ozempic (started 11/2018) due to nausea, vomiting, abdominal pain-stopped 04/2019. ?She was on Invokana >> intolerance 2/2 weight loss and dehydration >> hospitalization. ?She was on Metformin and sulfonylurea at diagnosis. ?She was also on Januvia at the same time with Invokana >> stopped along with Invokana  ? ?Pt was checking sugars 2-3x a day ?- am: 88, 102-210, 253, 283 >> 112-218 >> 121-140 >> 71, 86-114 ?- 2h after b'fast: 326 >> n/c >> 326 (10/2019) >> n/c ?- before lunch: 265 >> 124-172 >> n/c ?- 2h after lunch: 280, 286 >> n/c ?- before dinner: 68-157 >> 73-193, 215 >> n/c >> 180 (doughnuts) >> occasionally checking: <140 ?- 2h after dinner: 247 >> 196- 201 >> 172 >> n/c  ?- bedtime: n/c ?- nighttime: n/c >> 68  - seldom ?Lowest sugar was 67 >> 64 x1, 84 >> 68 >> 71; she has hypoglycemia awareness at 80. ?Highest sugar was 335 (no medications) >> 218 >> 180 >> <200. ? ?Glucometer: [place] >> One Touch Verio ? ?Pt's meals are: ?- Breakfast: eggs or cheese half a sandwich ?- Lunch: half a sandwich, apple ?- Dinner 9-9:30 pm: chicken, veggies ?- Snacks: stopped cheese; now apples and pickles, bedtime snack: orange  or apple ? ?She works part-time, from 10 am - 2 pm 29-Dec-2022.  ? ?-+ CKD stage IIIb (Dr. Candiss Norse), last BUN/creatinine:  ?12/2021: Cr 1.0, GFR 33 ?Lab Results  ?Component Value Date  ? BUN 33 (H) 11/20/2021  ? BUN 28 (H) 11/19/2021  ? CREATININE 1.98 (H) 11/20/2021  ? CREATININE 1.80 (H) 11/19/2021  ? ?Lab Results  ?Component Value Date  ? GFRAA 32 (L) 12/15/2020  ? GFRAA 25 (L) 08/04/2020  ? GFRAA 23  (L) 07/31/2020  ? GFRAA 22 (L) 07/30/2020  ? GFRAA 31 (L) 10/18/2019  ?On olmesartan 40. ? ?-+ HL; last set of lipids: ?Lab Results  ?Component Value Date  ? CHOL 159 11/19/2021  ? HDL 32 (L) 11/19/2021  ? LDLCALC 102 (H) 11/19/2021  ? LDLDIRECT 146.4 01/30/2010  ? TRIG 127 11/19/2021  ? CHOLHDL 5.0 11/19/2021  ?On Lipitor 80 mg daily (changed from Crestor 40 mg daily) , omega-3 fatty acids 1200 mg daily. ? ?- last eye exam was: 02/2021: + DR ?She also has glaucoma. ? ?-+ Numbness and tingling in her feet.  She has a callus on the left foot.  She sees a podiatrist.  She did not tolerate Lyrica.  Last foot exam August 04, 2021. ? ?On ASA 81. ? ?Pt has FH of DM in sister. ? ?She also has a history of nontoxic multinodular goiter.   ?Latest TSH was reviewed and this was normal: ?Lab Results  ?Component Value Date  ? TSH 1.60 08/04/2021  ? ?She also has a history of HTN, brain aneurysm, emphysema, esophageal stricture, GERD.  ?She had hernia repair surgery- had mesh placed >> persistent abdominal pain. ? ?Her husband's nephew, Modena Slater, was also my patient and he died at the beginning of 29-Dec-2019 after he stopped going to dialysis. ? ?ROS: ?+ see HPI ? ?I reviewed pt's medications, allergies, PMH, social hx, family hx, and changes were documented in the history of present illness. Otherwise, unchanged from my initial visit note. ? ?Past Medical History:  ?Diagnosis Date  ? Abnormal findings on esophagogastroduodenoscopy (EGD) 07/2010  ? Aneurysm ([place]) 28-Dec-2001  ? in brain x 2,  "small"  ? Anxiety   ? Arthritis   ? Cataract   ? CKD (chronic kidney disease), stage IV ([place])   ? followed by [facility]  ? Colon polyps   ? Diabetes mellitus 1998  ? Diverticulosis   ? Emphysema of lung ([place])   ? "no one has evry told me that"  ? ESOPHAGEAL STRICTURE 08/27/2008  ? Family history of adverse reaction to anesthesia   ? daughter n/v  ? GERD (gastroesophageal reflux disease)   ? Hemorrhoids   ? Hiatal hernia   ? Hyperlipidemia    ? Hypertension Dec 29, 1998  ? Migraines   ? Neuropathy   ? fingers and toe  ? Peripheral vascular disease ([place])   ? Phlebitis   ? 30 years ago  left leg  ? Stroke [facility])   ? multiple mini strokes ( brain aneurysm ).  Right side weaker.  ? ?Past Surgical History:  ?Procedure Laterality Date  ? ABDOMINAL AORTAGRAM N/A 01/04/2012  ? Procedure: ABDOMINAL AORTAGRAM;  Surgeon: Serafina Mitchell, MD;  Location: [facility] CATH LAB;  Service: Cardiovascular;  Laterality: N/A;  ? ABDOMINAL AORTAGRAM N/A 08/15/2012  ? Procedure: ABDOMINAL AORTAGRAM;  Surgeon: Serafina Mitchell, MD;  Location: [facility] CATH LAB;  Service: Cardiovascular;  Laterality: N/A;  ? ABDOMINAL AORTOGRAM W/LOWER EXTREMITY Left 11/17/2021  ? Procedure: ABDOMINAL AORTOGRAM  W/LOWER EXTREMITY;  Surgeon: Serafina Mitchell, MD;  Location: [facility] CV LAB;  Service: Cardiovascular;  Laterality: Left;  ? ABDOMINAL HYSTERECTOMY  1984  ? cataract surgery Right 10-22-2015  ? cataract surgery Left 11-05-2015  ? CESAREAN SECTION    ? CHOLECYSTECTOMY    ? COLONOSCOPY  07/2010  ? DENTAL SURGERY  Aug. 16, 2013  ? left lower   ? ESOPHAGOGASTRODUODENOSCOPY  07/2010  ? EYE SURGERY  Nov. 2014  ? Laser-Glaucoma  ? FEMORAL ARTERY STENT  05/11/11  ? Left superficial femoral and popliteal artery  ? FEMORAL-POPLITEAL BYPASS GRAFT Left 11/18/2021  ? Procedure: LEFT FEMORAL-POPLITEAL ARTERY BYPASS GRAFT;  Surgeon: Serafina Mitchell, MD;  Location: [place];  Service: Vascular;  Laterality: Left;  ? HERNIA REPAIR    ? times two  ? KNEE SURGERY    ? LOWER EXTREMITY ANGIOGRAM Left 08/15/2012  ? Procedure: LOWER EXTREMITY ANGIOGRAM;  Surgeon: Serafina Mitchell, MD;  Location: [facility] CATH LAB;  Service: Cardiovascular;  Laterality: Left;  lt leg angio  ? rotator cuff surgery    ? THYROID SURGERY    ? ?Social History  ? ?Socioeconomic History  ? Marital status: Married  ?  Spouse name: Not on file  ? Number of children: 2  ? Years of education: Not on file  ? Highest education level: Not on file  ?Occupational History  ?  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  ?Tobacco Use  ? Smoking status: Light Smoker  ?  Packs/day: 0.40  ?  Years: 40.00  ?  Pack years: 16.00  ?  Types: Cigarettes  ? Smokeless tobacco: Leodis Binet

## 2022-01-22 NOTE — Patient Instructions (Addendum)
Please  continue: - Farxiga 10 mg daily before b'fast - Tresiba 40 units daily - Novolog: 10-14 units 2x day before meals 4-5 units for correction of a blood sugars >200   Please stop at the lab.  Please return in 4 months with your sugar log.  

## 2022-01-22 NOTE — Addendum Note (Signed)
Addended by: Lauralyn Primes on: 01/22/2022 08:48 AM ? ? Modules accepted: Orders ? ?

## 2022-02-01 ENCOUNTER — Ambulatory Visit (INDEPENDENT_AMBULATORY_CARE_PROVIDER_SITE_OTHER): Payer: Medicare Other | Admitting: Orthopedic Surgery

## 2022-02-01 ENCOUNTER — Ambulatory Visit: Payer: Medicare Other

## 2022-02-01 ENCOUNTER — Encounter (HOSPITAL_COMMUNITY): Payer: Medicare Other

## 2022-02-01 ENCOUNTER — Encounter: Payer: Self-pay | Admitting: Orthopedic Surgery

## 2022-02-01 DIAGNOSIS — I779 Disorder of arteries and arterioles, unspecified: Secondary | ICD-10-CM | POA: Diagnosis not present

## 2022-02-01 DIAGNOSIS — I87332 Chronic venous hypertension (idiopathic) with ulcer and inflammation of left lower extremity: Secondary | ICD-10-CM | POA: Diagnosis not present

## 2022-02-01 NOTE — Progress Notes (Signed)
? ?Office Visit Note ?  ?Patient: [name]           ?Date of Birth: 1946-04-02           ?MRN: [id number] ?Visit Date: 02/01/2022 ?             ?Requested by: [name], MD ?[address] ?[address] ?[place],  [place] [address] ?PCP: [name], MD ? ?Chief Complaint  ?Patient presents with  ? Left Leg - Wound Check  ? ? ? ? ?HPI: ?Patient is a 76 year old woman who is seen in follow-up for venous ulceration left medial calf.  Patient states she is currently wearing her compression sock about 12 hours a day.  She states she has an appointment with vascular vein surgery June 12. ? ?Assessment & Plan: ?Visit Diagnoses:  ?1. Chronic venous hypertension (idiopathic) with ulcer and inflammation of left lower extremity ([place])   ? ? ?Plan: Continue with compression elevation and exercise. ? ?Follow-Up Instructions: Return in about 4 weeks (around 03/01/2022).  ? ?Ortho Exam ? ?Patient is alert, oriented, no adenopathy, well-dressed, normal affect, normal respiratory effort. ?Examination patient still has venous swelling in the left lower extremity the ulcer medially is 10 mm in diameter 1 mm deep with healthy granulation tissue.  There is no redness no cellulitis no tenderness to palpation there is no odor no drainage. ? ?Imaging: ?No results found. ?No images are attached to the encounter. ? ?Labs: ?Lab Results  ?Component Value Date  ? HGBA1C 7.6 (A) 01/22/2022  ? HGBA1C 7.1 (H) 11/17/2021  ? HGBA1C 8.5 (H) 08/04/2021  ? ESRSEDRATE 11 06/06/2018  ? ESRSEDRATE 12 05/01/2009  ? LABURIC 4.6 04/18/2018  ? REPTSTATUS 07/25/2017 FINAL 07/23/2017  ? CULT >=100,000 COLONIES/mL ESCHERICHIA COLI (A) 07/23/2017  ? LABORGA ESCHERICHIA COLI (A) 07/23/2017  ? ? ? ?Lab Results  ?Component Value Date  ? ALBUMIN 3.2 (L) 11/18/2021  ? ALBUMIN 3.5 10/28/2020  ? ALBUMIN 3.0 (L) 01/27/2017  ? ? ?Lab Results  ?Component Value Date  ? MG 2.2 08/04/2021  ? MG 2.5 08/04/2020  ? MG 1.9 11/13/2018  ? ?Lab Results  ?Component  Value Date  ? VD25OH 21 (L) 08/04/2020  ? VD25OH 27 (L) 04/18/2018  ? VD25OH 22 (L) 07/22/2016  ? ? ?No results found for: PREALBUMIN ? ?  Latest Ref Rng & Units 11/19/2021  ?  1:54 AM 11/17/2021  ?  1:42 PM 11/10/2021  ?  3:39 PM  ?CBC EXTENDED  ?WBC 4.0 - 10.5 K/uL 13.0   10.2   9.0    ?RBC 3.87 - 5.11 MIL/uL 3.80   4.49   4.53    ?Hemoglobin 12.0 - 15.0 g/dL 12.6   15.0   15.1    ?HCT 36.0 - 46.0 % 38.6   45.4   45.2    ?Platelets 150 - 400 K/uL 225   336   260    ?NEUT# 1,500 - 7,800 cells/uL   4,995    ?Lymph# 850 - 3,900 cells/uL   3,006    ? ? ? ?There is no height or weight on file to calculate BMI. ? ?Orders:  ?No orders of the defined types were placed in this encounter. ? ?No orders of the defined types were placed in this encounter. ? ? ? Procedures: ?No procedures performed ? ?Clinical Data: ?No additional findings. ? ?ROS: ? ?All other systems negative, except as noted in the HPI. ?Review of Systems ? ?Objective: ?Vital Signs: There were no  vitals taken for this visit. ? ?Specialty Comments:  ?No specialty comments available. ? ?PMFS History: ?Patient Active Problem List  ? Diagnosis Date Noted  ? PVD (peripheral vascular disease) ([place]) 01/10/2022  ? 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 Pain in right knee 01/07/2020  ? Carotid artery stenosis 12/03/2019  ? Migraine with aura and without status migrainosus, not intractable 11/28/2019  ? Hyperlipidemia associated with type 2 diabetes mellitus ([place]) 11/10/2019  ? Coronary artery calcification 08/05/2019  ? PAOD (peripheral arterial occlusive disease) ([place]) 02/26/2019  ? SOB (shortness of breath) 11/17/2018  ? Emphysema of lung ([place]) 10/10/2017  ? Atherosclerosis of aorta ([place]) 10/10/2017  ? Lung nodule 10/10/2017  ? Anxiety 08/22/2017  ? Arthritis 08/22/2017  ? Neuropathy due to secondary diabetes ([place]) 08/22/2017  ? Type 2 diabetes mellitus with circulatory disorder, with long-term current use of insulin  ([place]) 11/07/2015  ? Encounter for Medicare annual wellness exam 06/24/2015  ? Generalized anxiety disorder 03/26/2015  ? Depression, major, recurrent, in partial remission ([place]) 03/26/2015  ? History of CVA (cerebrovascular accident) 10/04/2014  ? Tobacco abuse 10/04/2014  ? Vitamin D deficiency 11/02/2013  ? Medication management 11/02/2013  ? Peripheral vascular disease due to secondary diabetes mellitus ([place]) 05/08/2012  ? Atherosclerosis of native arteries of extremity with intermittent claudication 12/24/2011  ? Diverticulosis of large intestine 06/09/2010  ? History of colonic polyps 06/09/2010  ? Esophageal reflux 08/27/2008  ? Nontoxic multinodular goiter 03/11/2008  ? 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[place]) 03/11/2008  ? Brain aneurysm 03/11/2008  ? CKD stage 4 due to type 2 diabetes mellitus ([place]) 05/01/2007  ? Essential hypertension 05/01/2007  ? ?Past Medical History:  ?Diagnosis Date  ? Abnormal findings on esophagogastroduodenoscopy (EGD) 07/2010  ? Aneurysm ([place]) 2003  ? in brain x 2,  "small"  ? Anxiety   ? Arthritis   ? Cataract   ? CKD (chronic kidney disease), stage IV ([place])   ? followed by [facility]  ? Colon polyps   ? Diabetes mellitus 1998  ? Diverticulosis   ? Emphysema of lung ([place])   ? "no one has evry told me that"  ? ESOPHAGEAL STRICTURE 08/27/2008  ? Family history of adverse reaction to anesthesia   ? daughter n/v  ? GERD (gastroesophageal reflux disease)   ? Hemorrhoids   ? Hiatal hernia   ? Hyperlipidemia   ? Hypertension 2000  ? Migraines   ? Neuropathy   ? fingers and toe  ? Peripheral vascular disease ([place])   ? Phlebitis   ? 30 years ago  left leg  ? Stroke [facility])   ? multiple mini strokes ( brain aneurysm ).  Right side weaker.  ?  ?Family History  ?Problem Relation Age of Onset  ? CAD Mother 35  ?     Died of MI  ? Hypertension Mother   ? Heart attack Mother   ? Heart disease Mother   ? CAD Father 22  ?     Died of MI  ? Heart disease Father   ? Heart attack Father   ? CAD  Brother 85  ?     Two brothers died of MI  ? Heart attack Brother   ? Heart disease Brother   ?     Amputation  ? Diabetes Sister   ? Hypertension Sister   ? Heart attack Brother   ? Heart disease Brother   ? Stroke Sister   ?  Colon cancer Neg Hx   ? Stomach cancer Neg Hx   ? Esophageal cancer Neg Hx   ?  ?Past Surgical History:  ?Procedure Laterality Date  ? ABDOMINAL AORTAGRAM N/A 01/04/2012  ? Procedure: ABDOMINAL AORTAGRAM;  Surgeon: Serafina Mitchell, MD;  Location: [facility] CATH LAB;  Service: Cardiovascular;  Laterality: N/A;  ? ABDOMINAL AORTAGRAM N/A 08/15/2012  ? Procedure: ABDOMINAL AORTAGRAM;  Surgeon: Serafina Mitchell, MD;  Location: [facility] CATH LAB;  Service: Cardiovascular;  Laterality: N/A;  ? ABDOMINAL AORTOGRAM W/LOWER EXTREMITY Left 11/17/2021  ? Procedure: ABDOMINAL AORTOGRAM W/LOWER EXTREMITY;  Surgeon: Serafina Mitchell, MD;  Location: [place] CV LAB;  Service: Cardiovascular;  Laterality: Left;  ? ABDOMINAL HYSTERECTOMY  1984  ? cataract surgery Right 10-22-2015  ? cataract surgery Left 11-05-2015  ? CESAREAN SECTION    ? CHOLECYSTECTOMY    ? COLONOSCOPY  07/2010  ? DENTAL SURGERY  Aug. 16, 2013  ? left lower   ? ESOPHAGOGASTRODUODENOSCOPY  07/2010  ? EYE SURGERY  Nov. 2014  ? Laser-Glaucoma  ? FEMORAL ARTERY STENT  05/11/11  ? Left superficial femoral and popliteal artery  ? FEMORAL-POPLITEAL BYPASS GRAFT Left 11/18/2021  ? Procedure: LEFT FEMORAL-POPLITEAL ARTERY BYPASS GRAFT;  Surgeon: Serafina Mitchell, MD;  Location: [place];  Service: Vascular;  Laterality: Left;  ? HERNIA REPAIR    ? times two  ? KNEE SURGERY    ? LOWER EXTREMITY ANGIOGRAM Left 08/15/2012  ? Procedure: LOWER EXTREMITY ANGIOGRAM;  Surgeon: Serafina Mitchell, MD;  Location: [facility] CATH LAB;  Service: Cardiovascular;  Laterality: Left;  lt leg angio  ? rotator cuff surgery    ? THYROID SURGERY    ? ?Social History  ? ?Occupational History  ? 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  ?Tobacco Use  ? Smoking status: Light Smoker  ?   Packs/day: 0.40  ?  Years: 40.00  ?  Pack years: 16.00  ?  Types: Cigarettes  ? Smokeless tobacco: Never  ? Tobacco comments:  ?  11/18/20 in process of quiting  ?Vaping Use  ? Vaping Use: Never used  ?Substance a

## 2022-02-15 ENCOUNTER — Other Ambulatory Visit: Payer: Self-pay | Admitting: Surgery

## 2022-03-01 ENCOUNTER — Ambulatory Visit (HOSPITAL_COMMUNITY)
Admission: RE | Admit: 2022-03-01 | Discharge: 2022-03-01 | Disposition: A | Discharge status: Home / Self Care | Payer: Medicare Other | Source: Ambulatory Visit | Attending: Surgery | Admitting: Surgery

## 2022-03-01 ENCOUNTER — Ambulatory Visit (INDEPENDENT_AMBULATORY_CARE_PROVIDER_SITE_OTHER): Payer: Medicare Other | Admitting: Physician Assistant

## 2022-03-01 ENCOUNTER — Ambulatory Visit (INDEPENDENT_AMBULATORY_CARE_PROVIDER_SITE_OTHER): Payer: Medicare Other | Admitting: Orthopedic Surgery

## 2022-03-01 ENCOUNTER — Ambulatory Visit (INDEPENDENT_AMBULATORY_CARE_PROVIDER_SITE_OTHER)
Admission: RE | Admit: 2022-03-01 | Discharge: 2022-03-01 | Disposition: A | Discharge status: Home / Self Care | Payer: Medicare Other | Source: Ambulatory Visit | Attending: Surgery | Admitting: Surgery

## 2022-03-01 VITALS — BP 135/65 | HR 67 | Temp 97.6°F | Resp 20 | Ht 66.0 in | Wt 181.7 lb

## 2022-03-01 DIAGNOSIS — I70243 Atherosclerosis of 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: Secondary | ICD-10-CM | POA: Diagnosis not present

## 2022-03-01 DIAGNOSIS — I779 Disorder of arteries and arterioles, unspecified: Secondary | ICD-10-CM

## 2022-03-01 DIAGNOSIS — I87332 Chronic venous hypertension (idiopathic) with ulcer and inflammation of left lower extremity: Secondary | ICD-10-CM | POA: Diagnosis not present

## 2022-03-01 NOTE — Progress Notes (Signed)
Office Note     CC:  follow up Requesting Provider:  [name], MD  HPI: [name] is a 76 y.o. (September 08, 1946) female who presents for surveillance of PAD.  She has history of left SFA stenting in 2012.  In-stent stenosis was treated with balloon angioplasty however ultimately failed.  She then required left femoral to below the knee popliteal bypass with vein by Dr. Trula Slade on 11/18/2021.  This was performed due to left medial ankle wound which patient states is nearly healed.  This is followed by Dr. Sharol Given.  She denies claudication, rest pain, or other wounds of bilateral lower extremities.  She is on aspirin and statin daily.   Past Medical History:  Diagnosis Date   Abnormal findings on esophagogastroduodenoscopy (EGD) 07/2010   Aneurysm ([place]) 2003   in brain x 2,  "small"   Anxiety    Arthritis    Cataract    CKD (chronic kidney disease), stage IV ([place])    followed by [facility]   Colon polyps    Diabetes mellitus 1998   Diverticulosis    Emphysema of lung ([place])    "no one has evry told me that"   ESOPHAGEAL STRICTURE 08/27/2008   Family history of adverse reaction to anesthesia    daughter n/v   GERD (gastroesophageal reflux disease)    Hemorrhoids    Hiatal hernia    Hyperlipidemia    Hypertension 2000   Migraines    Neuropathy    fingers and toe   Peripheral vascular disease ([place])    Phlebitis    30 years ago  left leg   Stroke [facility])    multiple mini strokes ( brain aneurysm ).  Right side weaker.    Past Surgical History:  Procedure Laterality Date   ABDOMINAL AORTAGRAM N/A 01/04/2012   Procedure: ABDOMINAL AORTAGRAM;  Surgeon: Serafina Mitchell, MD;  Location: [facility] CATH LAB;  Service: Cardiovascular;  Laterality: N/A;   ABDOMINAL AORTAGRAM N/A 08/15/2012   Procedure: ABDOMINAL Maxcine Ham;  Surgeon: Serafina Mitchell, MD;  Location: [facility] CATH LAB;  Service: Cardiovascular;  Laterality: N/A;   ABDOMINAL AORTOGRAM W/LOWER EXTREMITY Left 11/17/2021    Procedure: ABDOMINAL AORTOGRAM W/LOWER EXTREMITY;  Surgeon: Serafina Mitchell, MD;  Location: [place] CV LAB;  Service: Cardiovascular;  Laterality: Left;   ABDOMINAL HYSTERECTOMY  1984   cataract surgery Right 10-22-2015   cataract surgery Left 11-05-2015   CESAREAN SECTION     CHOLECYSTECTOMY     COLONOSCOPY  07/2010   DENTAL SURGERY  Aug. 16, 2013   left lower    ESOPHAGOGASTRODUODENOSCOPY  07/2010   EYE SURGERY  Nov. 2014   Laser-Glaucoma   FEMORAL ARTERY STENT  05/11/11   Left superficial femoral and popliteal artery   FEMORAL-POPLITEAL BYPASS GRAFT Left 11/18/2021   Procedure: LEFT FEMORAL-POPLITEAL ARTERY BYPASS GRAFT;  Surgeon: Serafina Mitchell, MD;  Location: [place];  Service: Vascular;  Laterality: Left;   HERNIA REPAIR     times two   KNEE SURGERY     LOWER EXTREMITY ANGIOGRAM Left 08/15/2012   Procedure: LOWER EXTREMITY ANGIOGRAM;  Surgeon: Serafina Mitchell, MD;  Location: [facility] CATH LAB;  Service: Cardiovascular;  Laterality: Left;  lt leg angio   rotator cuff surgery     THYROID SURGERY      Social History   Socioeconomic History   Marital status: Married    Spouse name: Not on file   Number of children: 2   Years of  education: Not on file   Highest education level: Not on file  Occupational History   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  Tobacco Use   Smoking status: Light Smoker    Packs/day: 0.40    Years: 40.00    Total pack years: 16.00    Types: Cigarettes   Smokeless tobacco: Never   Tobacco comments:    11/18/20 in process of quiting  Vaping Use   Vaping Use: Never used  Substance and Sexual Activity   Alcohol use: No    Alcohol/week: 0.0 standard drinks of alcohol   Drug use: Never   Sexual activity: Not on file  Other Topics Concern   Not on file  Social History Narrative   Lives with husband.        Social Determinants of Radio broadcast assistant Strain: Not on file  Food Insecurity: Not on file  Transportation Needs:  Not on file  Physical Activity: Not on file  Stress: Not on file  Social Connections: Not on file  Intimate Partner Violence: Not on file    Family History  Problem Relation Age of Onset   CAD Mother 60       Died of MI   Hypertension Mother    Heart attack Mother    Heart disease Mother    CAD Father 85       Died of MI   Heart disease Father    Heart attack Father    CAD Brother 41       Two brothers died of MI   Heart attack Brother    Heart disease Brother        Amputation   Diabetes Sister    Hypertension Sister    Heart attack Brother    Heart disease Brother    Stroke Sister    Colon cancer Neg Hx    Stomach cancer Neg Hx    Esophageal cancer Neg Hx     Current Outpatient Medications  Medication Sig Dispense Refill   amLODipine (NORVASC) 10 MG tablet TAKE 1 TABLET DAILY FOR BLOOD PRESSURE 90 tablet 3   aspirin 325 MG EC tablet Take 325 mg by mouth at bedtime.     atorvastatin (LIPITOR) 80 MG tablet TAKE 1 TABLET BY MOUTH EVERY DAY 90 tablet 3   b complex vitamins capsule Take 2 capsules by mouth in the morning and at bedtime.     Blood Glucose Monitoring Suppl (ONETOUCH VERIO FLEX SYSTEM) w/Device KIT USE AS ADVISED 1 kit 0   Carboxymethylcellulose Sodium (THERATEARS) 0.25 % SOLN Place 1 drop into both eyes daily as needed (dry eyes).     chlorthalidone (HYGROTON) 25 MG tablet Take 25 mg by mouth every other day. At night     citalopram (CELEXA) 40 MG tablet Take 40 mg by mouth daily.     Coenzyme Q10 (COQ10) 100 MG CAPS Take 100 mg by mouth daily.     dapagliflozin propanediol (FARXIGA) 10 MG TABS tablet TAKE 1 TABLET BY MOUTH EVERY DAY BEFORE BREAKFAST. 90 tablet 1   glucose blood test strip Use as instructed 3x a day 200 each 12   insulin aspart (NOVOLOG FLEXPEN) 100 UNIT/ML FlexPen Inject 10-14 Units into the skin 3 (three) times daily before meals. 45 mL 1   insulin degludec (TRESIBA FLEXTOUCH) 200 UNIT/ML FlexTouch Pen Inject 40 Units into the skin daily.  27 mL 1   Insulin Pen Needle 29G X 5MM MISC Use with insulin pen  3 times a day. 300 each 11   meclizine (ANTIVERT) 25 MG tablet 1/2 tab up to 3 times daily for motion sickness/dizziness. (Patient taking differently: Take 12.5 mg by mouth 3 (three) times daily as needed for dizziness. 1/2 tab up to 3 times daily for motion sickness/dizziness.) 30 tablet 0   olmesartan (BENICAR) 40 MG tablet TAKE 1 TABLET DAILY FOR BLOOD PRESSURE 90 tablet 3   Omega-3 Fatty Acids (FISH OIL) 1200 MG CAPS Take 1 capsule (1,200 mg total) by mouth daily.     omeprazole (PRILOSEC) 40 MG capsule Take 1 capsule (40 mg total) by mouth in the morning and at bedtime. (Patient taking differently: Take 40 mg by mouth at bedtime.) 180 capsule 3   OneTouch Delica Lancets 94T MISC Use 3x a day 200 each 3   OVER THE COUNTER MEDICATION Take 2 tablets by mouth 3 (three) times daily before meals. Gluco Revolve     OVER THE COUNTER MEDICATION Take 1 capsule by mouth daily. Brain Booster     rOPINIRole (REQUIP) 0.25 MG tablet Take 1 tablet (0.25 mg total) by mouth at bedtime. 90 tablet 2   No current facility-administered medications for this visit.    Allergies  Allergen Reactions   Ciprofloxacin Swelling   Ozempic (0.25 Or 0.5 Mg-Dose) [Semaglutide(0.25 Or 0.5mg -Dos)] Nausea And Vomiting   Plavix [Clopidogrel Bisulfate] Palpitations   Amoxicillin Itching and Swelling    FACE & EYES SWELL   Ace Inhibitors    Lyrica [Pregabalin]     Itch, gain weight   Penicillins Other (See Comments)    Pt unsure if there is an allergic reaction      REVIEW OF SYSTEMS:   [X]  denotes positive finding, [ ]  denotes negative finding Cardiac  Comments:  Chest pain or chest pressure:    Shortness of breath upon exertion:    Short of breath when lying flat:    Irregular heart rhythm:        Vascular    Pain in calf, thigh, or hip brought on by ambulation:    Pain in feet at night that wakes you up from your sleep:     Blood clot in your  veins:    Leg swelling:         Pulmonary    Oxygen at home:    Productive cough:     Wheezing:         Neurologic    Sudden weakness in arms or legs:     Sudden numbness in arms or legs:     Sudden onset of difficulty speaking or slurred speech:    Temporary loss of vision in one eye:     Problems with dizziness:         Gastrointestinal    Blood in stool:     Vomited blood:         Genitourinary    Burning when urinating:     Blood in urine:        Psychiatric    Major depression:         Hematologic    Bleeding problems:    Problems with blood clotting too easily:        Skin    Rashes or ulcers:        Constitutional    Fever or chills:      PHYSICAL EXAMINATION:  Vitals:   03/01/22 1330  BP: 135/65  Pulse: 67  Resp: 20  Temp: 97.6 F (36.4 C)  TempSrc: Temporal  SpO2: 95%  Weight: 181 lb 11.2 oz (82.4 kg)  Height: 5' 6"  (1.676 m)    General:  WDWN in NAD; vital signs documented above Gait: Not observed HENT: WNL, normocephalic Pulmonary: normal non-labored breathing , without Rales, rhonchi,  wheezing Cardiac: regular HR Abdomen: soft, NT, no masses Skin:  no rashes Vascular Exam/Pulses:  Right Left  Radial 2+ (normal) 2+ (normal)  Femoral 2+ (normal) 2+ (normal)  DP absent absent  PT absent absent   Extremities: without ischemic changes, without Gangrene , without cellulitis; without open wounds; nearly healed superficial dry eschar of the left medial ankle Musculoskeletal: no muscle wasting or atrophy  Neurologic: A&O X 3;  No focal weakness or paresthesias are detected Psychiatric:  The pt has Normal affect.   Non-Invasive Vascular Imaging:   Patent left leg bypass however there are low flow velocities throughout  ABI/TBIToday's ABIToday's TBIPrevious ABIPrevious TBI  +-------+-----------+-----------+------------+------------+  Right  .59                   .45         .19            +-------+-----------+-----------+------------+------------+  Left   .45                   .29         .29       ASSESSMENT/PLAN:: 76 y.o. female here for follow up for bypass surveillance  -Patient is status post left femoral to below the knee popliteal bypass with vein due to nonhealing wound of the left medial ankle.  The wound has been improving slowly and is nearly healed since surgery.  Despite this, bypass surveillance demonstrates very sluggish blood flow throughout the bypass.  Because of this plan will be to perform abdominal aortogram with left lower extremity runoff and possible intervention.  This was discussed with the patient including risks and she agrees to proceed.  This case was also discussed with Dr. Trula Slade who was involved in the treatment plan of this patient.   Dagoberto Ligas, PA-C [facility] [phone]  Clinic MD:   Trula Slade

## 2022-03-01 NOTE — H&P (View-Only) (Signed)
Office Note     CC:  follow up Requesting Provider:  [name], MD  HPI: [name] is a 76 y.o. ([birth year]) female who presents for surveillance of PAD.  She has history of left SFA stenting in 2012.  In-stent stenosis was treated with balloon angioplasty however ultimately failed.  She then required left femoral to below the knee popliteal bypass with vein by Dr. Trula Slade on 11/18/2021.  This was performed due to left medial ankle wound which patient states is nearly healed.  This is followed by Dr. Sharol Given.  She denies claudication, rest pain, or other wounds of bilateral lower extremities.  She is on aspirin and statin daily.   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,  "small"   Anxiety    Arthritis    Cataract    CKD (chronic kidney disease), stage IV ([place])    followed by [facility]   Colon polyps    Diabetes mellitus 1998   Diverticulosis    Emphysema of lung ([place])    "no one has evry told me that"   ESOPHAGEAL STRICTURE 08/27/2008   Family history of adverse reaction to anesthesia    daughter n/v   GERD (gastroesophageal reflux disease)    Hemorrhoids    Hiatal hernia    Hyperlipidemia    Hypertension 2000   Migraines    Neuropathy    fingers and toe   Peripheral vascular disease ([place])    Phlebitis    30 years ago  left leg   Stroke [facility])    multiple mini strokes ( brain aneurysm ).  Right side weaker.    Past Surgical History:  Procedure Laterality Date   ABDOMINAL AORTAGRAM N/A 01/04/2012   Procedure: ABDOMINAL AORTAGRAM;  Surgeon: Serafina Mitchell, MD;  Location: [facility] CATH LAB;  Service: Cardiovascular;  Laterality: N/A;   ABDOMINAL AORTAGRAM N/A 08/15/2012   Procedure: ABDOMINAL Maxcine Ham;  Surgeon: Serafina Mitchell, MD;  Location: [facility] CATH LAB;  Service: Cardiovascular;  Laterality: N/A;   ABDOMINAL AORTOGRAM W/LOWER EXTREMITY Left 11/17/2021    Procedure: ABDOMINAL AORTOGRAM W/LOWER EXTREMITY;  Surgeon: Serafina Mitchell, MD;  Location: [place] CV LAB;  Service: Cardiovascular;  Laterality: Left;   ABDOMINAL HYSTERECTOMY  1984   cataract surgery Right 10-22-2015   cataract surgery Left 11-05-2015   CESAREAN SECTION     CHOLECYSTECTOMY     COLONOSCOPY  07/2010   DENTAL SURGERY  Aug. 16, 2013   left lower    ESOPHAGOGASTRODUODENOSCOPY  07/2010   EYE SURGERY  Nov. 2014   Laser-Glaucoma   FEMORAL ARTERY STENT  05/11/11   Left superficial femoral and popliteal artery   FEMORAL-POPLITEAL BYPASS GRAFT Left 11/18/2021   Procedure: LEFT FEMORAL-POPLITEAL ARTERY BYPASS GRAFT;  Surgeon: Serafina Mitchell, MD;  Location: [place];  Service: Vascular;  Laterality: Left;   HERNIA REPAIR     times two   KNEE SURGERY     LOWER EXTREMITY ANGIOGRAM Left 08/15/2012   Procedure: LOWER EXTREMITY ANGIOGRAM;  Surgeon: Serafina Mitchell, MD;  Location: [facility] CATH LAB;  Service: Cardiovascular;  Laterality: Left;  lt leg angio   rotator cuff surgery     THYROID SURGERY      Social History   Socioeconomic History   Marital status: Married    Spouse name: Not on file   Number of children: 2   Years of  education: Not on file   Highest education level: Not on file  Occupational History   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  Tobacco Use   Smoking status: Light Smoker    Packs/day: 0.40    Years: 40.00    Total pack years: 16.00    Types: Cigarettes   Smokeless tobacco: Never   Tobacco comments:    11/18/20 in process of quiting  Vaping Use   Vaping Use: Never used  Substance and Sexual Activity   Alcohol use: No    Alcohol/week: 0.0 standard drinks of alcohol   Drug use: Never   Sexual activity: Not on file  Other Topics Concern   Not on file  Social History Narrative   Lives with husband.        Social Determinants of Radio broadcast assistant Strain: Not on file  Food Insecurity: Not on file  Transportation Needs:  Not on file  Physical Activity: Not on file  Stress: Not on file  Social Connections: Not on file  Intimate Partner Violence: Not on file    Family History  Problem Relation Age of Onset   CAD Mother 60       Died of MI   Hypertension Mother    Heart attack Mother    Heart disease Mother    CAD Father 85       Died of MI   Heart disease Father    Heart attack Father    CAD Brother 41       Two brothers died of MI   Heart attack Brother    Heart disease Brother        Amputation   Diabetes Sister    Hypertension Sister    Heart attack Brother    Heart disease Brother    Stroke Sister    Colon cancer Neg Hx    Stomach cancer Neg Hx    Esophageal cancer Neg Hx     Current Outpatient Medications  Medication Sig Dispense Refill   amLODipine (NORVASC) 10 MG tablet TAKE 1 TABLET DAILY FOR BLOOD PRESSURE 90 tablet 3   aspirin 325 MG EC tablet Take 325 mg by mouth at bedtime.     atorvastatin (LIPITOR) 80 MG tablet TAKE 1 TABLET BY MOUTH EVERY DAY 90 tablet 3   b complex vitamins capsule Take 2 capsules by mouth in the morning and at bedtime.     Blood Glucose Monitoring Suppl (ONETOUCH VERIO FLEX SYSTEM) w/Device KIT USE AS ADVISED 1 kit 0   Carboxymethylcellulose Sodium (THERATEARS) 0.25 % SOLN Place 1 drop into both eyes daily as needed (dry eyes).     chlorthalidone (HYGROTON) 25 MG tablet Take 25 mg by mouth every other day. At night     citalopram (CELEXA) 40 MG tablet Take 40 mg by mouth daily.     Coenzyme Q10 (COQ10) 100 MG CAPS Take 100 mg by mouth daily.     dapagliflozin propanediol (FARXIGA) 10 MG TABS tablet TAKE 1 TABLET BY MOUTH EVERY DAY BEFORE BREAKFAST. 90 tablet 1   glucose blood test strip Use as instructed 3x a day 200 each 12   insulin aspart (NOVOLOG FLEXPEN) 100 UNIT/ML FlexPen Inject 10-14 Units into the skin 3 (three) times daily before meals. 45 mL 1   insulin degludec (TRESIBA FLEXTOUCH) 200 UNIT/ML FlexTouch Pen Inject 40 Units into the skin daily.  27 mL 1   Insulin Pen Needle 29G X 5MM MISC Use with insulin pen  3 times a day. 300 each 11   meclizine (ANTIVERT) 25 MG tablet 1/2 tab up to 3 times daily for motion sickness/dizziness. (Patient taking differently: Take 12.5 mg by mouth 3 (three) times daily as needed for dizziness. 1/2 tab up to 3 times daily for motion sickness/dizziness.) 30 tablet 0   olmesartan (BENICAR) 40 MG tablet TAKE 1 TABLET DAILY FOR BLOOD PRESSURE 90 tablet 3   Omega-3 Fatty Acids (FISH OIL) 1200 MG CAPS Take 1 capsule (1,200 mg total) by mouth daily.     omeprazole (PRILOSEC) 40 MG capsule Take 1 capsule (40 mg total) by mouth in the morning and at bedtime. (Patient taking differently: Take 40 mg by mouth at bedtime.) 180 capsule 3   OneTouch Delica Lancets 25Q MISC Use 3x a day 200 each 3   OVER THE COUNTER MEDICATION Take 2 tablets by mouth 3 (three) times daily before meals. Gluco Revolve     OVER THE COUNTER MEDICATION Take 1 capsule by mouth daily. Brain Booster     rOPINIRole (REQUIP) 0.25 MG tablet Take 1 tablet (0.25 mg total) by mouth at bedtime. 90 tablet 2   No current facility-administered medications for this visit.    Allergies  Allergen Reactions   Ciprofloxacin Swelling   Ozempic (0.25 Or 0.5 Mg-Dose) [Semaglutide(0.25 Or 0.5mg -Dos)] Nausea And Vomiting   Plavix [Clopidogrel Bisulfate] Palpitations   Amoxicillin Itching and Swelling    FACE & EYES SWELL   Ace Inhibitors    Lyrica [Pregabalin]     Itch, gain weight   Penicillins Other (See Comments)    Pt unsure if there is an allergic reaction      REVIEW OF SYSTEMS:   [X]  denotes positive finding, [ ]  denotes negative finding Cardiac  Comments:  Chest pain or chest pressure:    Shortness of breath upon exertion:    Short of breath when lying flat:    Irregular heart rhythm:        Vascular    Pain in calf, thigh, or hip brought on by ambulation:    Pain in feet at night that wakes you up from your sleep:     Blood clot in your  veins:    Leg swelling:         Pulmonary    Oxygen at home:    Productive cough:     Wheezing:         Neurologic    Sudden weakness in arms or legs:     Sudden numbness in arms or legs:     Sudden onset of difficulty speaking or slurred speech:    Temporary loss of vision in one eye:     Problems with dizziness:         Gastrointestinal    Blood in stool:     Vomited blood:         Genitourinary    Burning when urinating:     Blood in urine:        Psychiatric    Major depression:         Hematologic    Bleeding problems:    Problems with blood clotting too easily:        Skin    Rashes or ulcers:        Constitutional    Fever or chills:      PHYSICAL EXAMINATION:  Vitals:   03/01/22 1330  BP: 135/65  Pulse: 67  Resp: 20  Temp: 97.6 F (36.4 C)  TempSrc: Temporal  SpO2: 95%  Weight: 181 lb 11.2 oz (82.4 kg)  Height: 5' 6"  (1.676 m)    General:  WDWN in NAD; vital signs documented above Gait: Not observed HENT: WNL, normocephalic Pulmonary: normal non-labored breathing , without Rales, rhonchi,  wheezing Cardiac: regular HR Abdomen: soft, NT, no masses Skin:  no rashes Vascular Exam/Pulses:  Right Left  Radial 2+ (normal) 2+ (normal)  Femoral 2+ (normal) 2+ (normal)  DP absent absent  PT absent absent   Extremities: without ischemic changes, without Gangrene , without cellulitis; without open wounds; nearly healed superficial dry eschar of the left medial ankle Musculoskeletal: no muscle wasting or atrophy  Neurologic: A&O X 3;  No focal weakness or paresthesias are detected Psychiatric:  The pt has Normal affect.   Non-Invasive Vascular Imaging:   Patent left leg bypass however there are low flow velocities throughout  ABI/TBIToday's ABIToday's TBIPrevious ABIPrevious TBI  +-------+-----------+-----------+------------+------------+  Right  .59                   .45         .19            +-------+-----------+-----------+------------+------------+  Left   .69                   .45         .29       ASSESSMENT/PLAN:: 76 y.o. female here for follow up for bypass surveillance  -Patient is status post left femoral to below the knee popliteal bypass with vein due to nonhealing wound of the left medial ankle.  The wound has been improving slowly and is nearly healed since surgery.  Despite this, bypass surveillance demonstrates very sluggish blood flow throughout the bypass.  Because of this plan will be to perform abdominal aortogram with left lower extremity runoff and possible intervention.  This was discussed with the patient including risks and she agrees to proceed.  This case was also discussed with Dr. Trula Slade who was involved in the treatment plan of this patient.   Dagoberto Ligas, PA-C [facility] [phone]  Clinic MD:   Trula Slade

## 2022-03-03 ENCOUNTER — Other Ambulatory Visit: Payer: Self-pay

## 2022-03-03 DIAGNOSIS — I70243 Atherosclerosis of 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: Secondary | ICD-10-CM

## 2022-03-04 ENCOUNTER — Encounter: Payer: Self-pay | Admitting: Internal Medicine

## 2022-03-05 ENCOUNTER — Encounter: Payer: Self-pay | Admitting: Orthopedic Surgery

## 2022-03-05 NOTE — Progress Notes (Signed)
Office Visit Note   Patient: [name]           Date of Birth: 06-Nov-1945           MRN: [id number] Visit Date: 03/01/2022              Requested by: [name], [address] [place] [address] [place],  [place] [id number] PCP: [name], MD  Chief Complaint  Patient presents with   Left Leg - Follow-up      HPI: Follow-up for mixed arterial and venous insufficiency ulcer left calf.  She has been wearing compression socks.  She has an appointment with vascular vein today for ankle-brachial indices.   Assessment & Plan: Visit Diagnoses:  1. Chronic venous hypertension (idiopathic) with ulcer and inflammation of left lower extremity (HCC)     Plan: Recommend continue compression follow-up with vascular vein surgery today follow-up in the office in 4 weeks  Follow-Up Instructions: Return in about 4 weeks (around 03/29/2022).   Ortho Exam  Patient is alert, oriented, no adenopathy, well-dressed, normal affect, normal respiratory effort. Examination the venous stasis ulcer is healing the medial left calf.  Her legs are improving there is no cellulitis no drainage no odor.  Ankle-brachial indices show 0.59 on the right and 0.51 on the left.  Imaging: No results found. No images are attached to the encounter.  Labs: Lab Results  Component Value Date   HGBA1C 7.6 (A) 01/22/2022   HGBA1C 7.1 (H) 11/17/2021   HGBA1C 8.5 (H) 08/04/2021   ESRSEDRATE 11 06/06/2018   ESRSEDRATE 12 05/01/2009   LABURIC 4.6 04/18/2018   REPTSTATUS 07/25/2017 FINAL 07/23/2017   CULT >=100,000 COLONIES/mL ESCHERICHIA COLI (A) 07/23/2017   LABORGA ESCHERICHIA COLI (A) 07/23/2017     Lab Results  Component Value Date   ALBUMIN 3.2 (L) 11/18/2021   ALBUMIN 3.5 10/28/2020   ALBUMIN 3.0 (L) 01/27/2017    Lab Results  Component Value Date   MG 2.2 08/04/2021   MG 2.5 08/04/2020   MG 1.9 11/13/2018   Lab Results  Component Value Date   VD25OH 21 (L) 08/04/2020   VD25OH 27  (L) 04/18/2018   VD25OH 22 (L) 07/22/2016    No results found for: "PREALBUMIN"    Latest Ref Rng & Units 11/19/2021    1:54 AM 11/17/2021    1:42 PM 11/10/2021    3:39 PM  CBC EXTENDED  WBC 4.0 - 10.5 K/uL 13.0  10.2  9.0   RBC 3.87 - 5.11 MIL/uL 3.80  4.49  4.53   Hemoglobin 12.0 - 15.0 g/dL 12.6  15.0  15.1   HCT 36.0 - 46.0 % 38.6  45.4  45.2   Platelets 150 - 400 K/uL 225  336  260   NEUT# 1,500 - 7,800 cells/uL   4,995   Lymph# 850 - 3,900 cells/uL   3,006      There is no height or weight on file to calculate BMI.  Orders:  No orders of the defined types were placed in this encounter.  No orders of the defined types were placed in this encounter.    Procedures: No procedures performed  Clinical Data: No additional findings.  ROS:  All other systems negative, except as noted in the HPI. Review of Systems  Objective: Vital Signs: There were no vitals taken for this visit.  Specialty Comments:  No specialty comments available.  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) 10/10/2017   Atherosclerosis of aorta ([place]) 10/10/2017   Lung nodule 10/10/2017   Anxiety 08/22/2017   Arthritis 08/22/2017   Neuropathy due to secondary diabetes ([place]) 08/22/2017   Type 2 diabetes mellitus with circulatory disorder, with long-term current use of insulin ([place]) 11/07/2015   Encounter for Medicare annual wellness exam  06/24/2015   Generalized anxiety disorder 03/26/2015   Depression, major, recurrent, in partial remission ([place]) 03/26/2015   History of CVA (cerebrovascular accident) 10/04/2014   Tobacco abuse 10/04/2014   Vitamin D deficiency 11/02/2013   Medication management 11/02/2013   Peripheral vascular disease due to secondary diabetes mellitus ([place]) 05/08/2012   Atherosclerosis of native arteries of extremity with intermittent claudication 12/24/2011   Diverticulosis of large intestine 06/09/2010   History of colonic polyps 06/09/2010   Esophageal reflux 08/27/2008   Nontoxic multinodular goiter 03/11/2008   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[place]) 03/11/2008   Brain aneurysm 03/11/2008   CKD stage 4 due to type 2 diabetes mellitus ([place]) 05/01/2007   Essential hypertension 05/01/2007   Past Medical History:  Diagnosis Date   Abnormal findings on esophagogastroduodenoscopy (EGD) 07/2010   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,  "small"   Anxiety    Arthritis    Cataract    CKD (chronic kidney disease), stage IV ([place])    followed by [facility]   Colon polyps    Diabetes mellitus 1998   Diverticulosis    Emphysema of lung ([place])    "no one has evry told me that"   ESOPHAGEAL STRICTURE 08/27/2008   Family history of adverse reaction to anesthesia    daughter n/v   GERD (gastroesophageal reflux disease)    Hemorrhoids    Hiatal hernia    Hyperlipidemia    Hypertension 2000   Migraines    Neuropathy    fingers and toe   Peripheral vascular disease ([place])    Phlebitis    30 years ago  left leg   Stroke [facility])    multiple mini strokes ( brain aneurysm ).  Right side weaker.    Family History  Problem Relation Age of Onset   CAD Mother 34       Died of MI   Hypertension Mother    Heart attack Mother    Heart disease Mother    CAD Father 52       Died of MI   Heart disease Father    Heart attack Father    CAD Brother 65       Two brothers died of MI   Heart attack Brother     Heart disease Brother        Amputation   Diabetes Sister    Hypertension Sister    Heart attack Brother    Heart disease Brother    Stroke Sister    Colon cancer Neg Hx    Stomach cancer Neg Hx    Esophageal cancer Neg Hx     Past Surgical History:  Procedure Laterality Date   ABDOMINAL AORTAGRAM N/A 01/04/2012   Procedure: ABDOMINAL AORTAGRAM;  Surgeon: Serafina Mitchell, MD;  Location[facility] CATH LAB;  Service: Cardiovascular;  Laterality: N/A;   ABDOMINAL AORTAGRAM N/A 08/15/2012   Procedure: ABDOMINAL Maxcine Ham;  Surgeon: Serafina Mitchell, MD;  Location: [facility] CATH LAB;  Service: Cardiovascular;  Laterality: N/A;   ABDOMINAL AORTOGRAM W/LOWER EXTREMITY Left 11/17/2021   Procedure: ABDOMINAL AORTOGRAM W/LOWER EXTREMITY;  Surgeon: Serafina Mitchell, MD;  Location: [place] CV LAB;  Service: Cardiovascular;  Laterality: Left;   ABDOMINAL HYSTERECTOMY  1984   cataract surgery Right 10-22-2015   cataract surgery Left 11-05-2015   CESAREAN SECTION     CHOLECYSTECTOMY     COLONOSCOPY  07/2010   DENTAL SURGERY  Aug. 16, 2013   left lower    ESOPHAGOGASTRODUODENOSCOPY  07/2010   EYE SURGERY  Nov. 2014   Laser-Glaucoma   FEMORAL ARTERY STENT  05/11/11   Left superficial femoral and popliteal artery   FEMORAL-POPLITEAL BYPASS GRAFT Left 11/18/2021   Procedure: LEFT FEMORAL-POPLITEAL ARTERY BYPASS GRAFT;  Surgeon: Serafina Mitchell, MD;  Location: [place];  Service: Vascular;  Laterality: Left;   HERNIA REPAIR     times two   KNEE SURGERY     LOWER EXTREMITY ANGIOGRAM Left 08/15/2012   Procedure: LOWER EXTREMITY ANGIOGRAM;  Surgeon: Serafina Mitchell, MD;  Location: [facility] CATH LAB;  Service: Cardiovascular;  Laterality: Left;  lt leg angio   rotator cuff surgery     THYROID SURGERY     Social History   Occupational History   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  Tobacco Use   Smoking status: Light Smoker    Packs/day: 0.40    Years: 40.00    Total pack years: 16.00    Types:  Cigarettes   Smokeless tobacco: Never   Tobacco comments:    11/18/20 in process of quiting  Vaping Use   Vaping Use: Never used  Substance and Sexual Activity   Alcohol use: No    Alcohol/week: 0.0 standard drinks of alcohol   Drug use: Never   Sexual activity: Not on file

## 2022-03-08 DIAGNOSIS — I70243 Atherosclerosis of 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: Secondary | ICD-10-CM | POA: Diagnosis not present

## 2022-03-09 ENCOUNTER — Other Ambulatory Visit: Payer: Self-pay

## 2022-03-09 ENCOUNTER — Encounter (HOSPITAL_COMMUNITY)
Admission: RE | Disposition: A | Discharge status: Home / Self Care | Payer: Self-pay | Source: Home / Self Care | Attending: Surgery

## 2022-03-09 ENCOUNTER — Ambulatory Visit (HOSPITAL_COMMUNITY)
Admission: RE | Admit: 2022-03-09 | Discharge: 2022-03-09 | Disposition: A | Discharge status: Home / Self Care | Payer: Medicare Other | Attending: Surgery | Admitting: Surgery

## 2022-03-09 DIAGNOSIS — E1151 Type 2 diabetes mellitus with diabetic peripheral angiopathy without gangrene: Secondary | ICD-10-CM | POA: Diagnosis not present

## 2022-03-09 DIAGNOSIS — Z794 Long term (current) use of insulin: Secondary | ICD-10-CM | POA: Insufficient documentation

## 2022-03-09 DIAGNOSIS — E11621 Type 2 diabetes mellitus with foot ulcer: Secondary | ICD-10-CM | POA: Insufficient documentation

## 2022-03-09 DIAGNOSIS — Z7982 Long term (current) use of aspirin: Secondary | ICD-10-CM | POA: Insufficient documentation

## 2022-03-09 DIAGNOSIS — F17210 Nicotine dependence, cigarettes, uncomplicated: Secondary | ICD-10-CM | POA: Insufficient documentation

## 2022-03-09 DIAGNOSIS — L97329 Non-pressure chronic ulcer of left ankle with unspecified severity: Secondary | ICD-10-CM | POA: Diagnosis not present

## 2022-03-09 DIAGNOSIS — N184 Chronic kidney disease, stage 4 (severe): Secondary | ICD-10-CM | POA: Diagnosis not present

## 2022-03-09 DIAGNOSIS — I70243 Atherosclerosis of 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: Secondary | ICD-10-CM | POA: Diagnosis not present

## 2022-03-09 DIAGNOSIS — I129 Hypertensive chronic kidney disease with stage 1 through stage 4 chronic kidney disease, or unspecified chronic kidney disease: Secondary | ICD-10-CM | POA: Diagnosis not present

## 2022-03-09 DIAGNOSIS — E1122 Type 2 diabetes mellitus with diabetic chronic kidney disease: Secondary | ICD-10-CM | POA: Insufficient documentation

## 2022-03-09 DIAGNOSIS — Z7984 Long term (current) use of oral hypoglycemic drugs: Secondary | ICD-10-CM | POA: Diagnosis not present

## 2022-03-09 HISTORY — PX: PERIPHERAL VASCULAR BALLOON ANGIOPLASTY: CATH118281

## 2022-03-09 HISTORY — PX: ABDOMINAL AORTOGRAM W/LOWER EXTREMITY: CATH118223

## 2022-03-09 LAB — POCT I-STAT, CHEM 8
BUN: 42 mg/dL — ABNORMAL HIGH (ref 8–23)
Calcium, Ion: 1.21 mmol/L (ref 1.15–1.40)
Chloride: 105 mmol/L (ref 98–111)
Creatinine, Ser: 2.2 mg/dL — ABNORMAL HIGH (ref 0.44–1.00)
Glucose, Bld: 133 mg/dL — ABNORMAL HIGH (ref 70–99)
HCT: 44 % (ref 36.0–46.0)
Hemoglobin: 15 g/dL (ref 12.0–15.0)
Potassium: 4.4 mmol/L (ref 3.5–5.1)
Sodium: 140 mmol/L (ref 135–145)
TCO2: 26 mmol/L (ref 22–32)

## 2022-03-09 LAB — POCT ACTIVATED CLOTTING TIME: Activated Clotting Time: 257 seconds

## 2022-03-09 LAB — GLUCOSE, CAPILLARY: Glucose-Capillary: 90 mg/dL (ref 70–99)

## 2022-03-09 SURGERY — ABDOMINAL AORTOGRAM W/LOWER EXTREMITY
Anesthesia: LOCAL

## 2022-03-09 MED ORDER — OXYCODONE HCL 5 MG PO TABS: 5.0000 mg | ORAL_TABLET | ORAL | Status: DC | PRN

## 2022-03-09 MED ORDER — HEPARIN (PORCINE) IN NACL 1000-0.9 UT/500ML-% IV SOLN
INTRAVENOUS | Status: AC
  Filled 2022-03-09: qty 1000

## 2022-03-09 MED ORDER — IODIXANOL 320 MG/ML IV SOLN
INTRAVENOUS | Status: DC | PRN
  Administered 2022-03-09: 32 mL

## 2022-03-09 MED ORDER — LIDOCAINE HCL (PF) 1 % IJ SOLN
INTRAMUSCULAR | Status: DC | PRN
  Administered 2022-03-09: 15 mL

## 2022-03-09 MED ORDER — HEPARIN (PORCINE) IN NACL 1000-0.9 UT/500ML-% IV SOLN
INTRAVENOUS | Status: DC | PRN
  Administered 2022-03-09 (×2): 500 mL

## 2022-03-09 MED ORDER — HEPARIN SODIUM (PORCINE) 1000 UNIT/ML IJ SOLN
INTRAMUSCULAR | Status: DC | PRN
  Administered 2022-03-09: 8000 [IU] via INTRAVENOUS

## 2022-03-09 MED ORDER — ACETAMINOPHEN 325 MG PO TABS
650.0000 mg | ORAL_TABLET | ORAL | Status: DC | PRN
Start: 2022-03-09 — End: 2022-03-09

## 2022-03-09 MED ORDER — MIDAZOLAM HCL 2 MG/2ML IJ SOLN
INTRAMUSCULAR | Status: DC | PRN
  Administered 2022-03-09: 2 mg via INTRAVENOUS

## 2022-03-09 MED ORDER — SODIUM CHLORIDE 0.9% FLUSH: 3.0000 mL | Freq: Two times a day (BID) | INTRAVENOUS | Status: DC

## 2022-03-09 MED ORDER — HYDRALAZINE HCL 20 MG/ML IJ SOLN: 5.0000 mg | INTRAMUSCULAR | Status: DC | PRN

## 2022-03-09 MED ORDER — FENTANYL CITRATE (PF) 100 MCG/2ML IJ SOLN
INTRAMUSCULAR | Status: AC
  Filled 2022-03-09: qty 2

## 2022-03-09 MED ORDER — SODIUM CHLORIDE 0.9% FLUSH: 3.0000 mL | INTRAVENOUS | Status: DC | PRN

## 2022-03-09 MED ORDER — MORPHINE SULFATE (PF) 2 MG/ML IV SOLN: 2.0000 mg | INTRAVENOUS | Status: DC | PRN

## 2022-03-09 MED ORDER — LORAZEPAM 0.5 MG PO TABS
1.0000 mg | ORAL_TABLET | Freq: Once | ORAL | Status: AC
  Administered 2022-03-09: 1 mg via ORAL
  Filled 2022-03-09: qty 2

## 2022-03-09 MED ORDER — ONDANSETRON HCL 4 MG/2ML IJ SOLN: 4.0000 mg | Freq: Four times a day (QID) | INTRAMUSCULAR | Status: DC | PRN

## 2022-03-09 MED ORDER — HEPARIN SODIUM (PORCINE) 1000 UNIT/ML IJ SOLN
INTRAMUSCULAR | Status: AC
  Filled 2022-03-09: qty 10

## 2022-03-09 MED ORDER — SODIUM CHLORIDE 0.9 % IV SOLN: INTRAVENOUS | Status: DC

## 2022-03-09 MED ORDER — MIDAZOLAM HCL 2 MG/2ML IJ SOLN
INTRAMUSCULAR | Status: AC
  Filled 2022-03-09: qty 2

## 2022-03-09 MED ORDER — SODIUM CHLORIDE 0.9 % IV SOLN: 250.0000 mL | INTRAVENOUS | Status: DC | PRN

## 2022-03-09 MED ORDER — FENTANYL CITRATE (PF) 100 MCG/2ML IJ SOLN
INTRAMUSCULAR | Status: DC | PRN
  Administered 2022-03-09: 50 ug via INTRAVENOUS

## 2022-03-09 MED ORDER — SODIUM CHLORIDE 0.9 % WEIGHT BASED INFUSION: 1.0000 mL/kg/h | INTRAVENOUS | Status: DC

## 2022-03-09 MED ORDER — LABETALOL HCL 5 MG/ML IV SOLN: 10.0000 mg | INTRAVENOUS | Status: DC | PRN

## 2022-03-09 MED ORDER — LIDOCAINE HCL (PF) 1 % IJ SOLN
INTRAMUSCULAR | Status: AC
  Filled 2022-03-09: qty 30

## 2022-03-09 SURGICAL SUPPLY — 16 items
BALLN STERLING OTW 3X100X150 (BALLOONS) ×3
BALLOON STERLING OTW 3X100X150 (BALLOONS) IMPLANT
CATH OMNI FLUSH 5F 65CM (CATHETERS) ×1 IMPLANT
DCB RANGER 4.0X100 135 (BALLOONS) IMPLANT
DEVICE VASC CLSR CELT ART 5 (Vascular Products) ×1 IMPLANT
KIT ENCORE 26 ADVANTAGE (KITS) ×1 IMPLANT
KIT MICROPUNCTURE NIT STIFF (SHEATH) ×1 IMPLANT
KIT PV (KITS) ×4 IMPLANT
RANGER DCB 4.0X100 135 (BALLOONS) ×3
SHEATH DESTINATION MP 5FR 45CM (SHEATH) ×1 IMPLANT
SHEATH PINNACLE 5F 10CM (SHEATH) ×1 IMPLANT
SYR MEDRAD MARK V 150ML (SYRINGE) ×1 IMPLANT
TRANSDUCER W/STOPCOCK (MISCELLANEOUS) ×4 IMPLANT
TRAY PV CATH (CUSTOM PROCEDURE TRAY) ×4 IMPLANT
WIRE BENTSON .035X145CM (WIRE) ×1 IMPLANT
WIRE G V18X300CM (WIRE) ×1 IMPLANT

## 2022-03-09 NOTE — Op Note (Signed)
Patient name: [name] MRN: [id number] DOB: 04/19/1946 Sex: female  03/09/2022 Pre-operative Diagnosis: Left leg ulcer Post-operative diagnosis:  Same Surgeon:  [name] Procedure Performed:  1.  Ultrasound-guided access, right femoral artery  2.  Abdominal aortogram with CO2  3.  Left lower extremity runoff  4.  Drug-coated balloon angioplasty, left femoral to below-knee popliteal artery bypass  5.  Closure device, Celt  6.  Conscious sedation, 48 minutes WIFI:  1:0:0  Indications: This is a 76 year old female who recently underwent left femoral-popliteal bypass graft.  Her wound is healing however she had sluggish flows through the bypass graft on ultrasound and so she is here for further evaluation.  Procedure:  The patient was identified in the holding area and taken to room 8.  The patient was then placed supine on the table and prepped and draped in the usual sterile fashion.  A time out was called.  Conscious sedation was administered with the use of IV fentanyl and Versed under continuous physician and nurse monitoring.  Heart rate, blood pressure, and oxygen saturation were continuously monitored.  Total sedation time was 48 minutes.  Ultrasound was used to evaluate the right common femoral artery.  It was patent .  A digital ultrasound image was acquired.  A micropuncture needle was used to access the right common femoral artery under ultrasound guidance.  An 018 wire was advanced without resistance and a micropuncture sheath was placed.  The 018 wire was removed and a benson wire was placed.  The micropuncture sheath was exchanged for a 5 french sheath.  An omniflush catheter was advanced over the wire to the level of L-1.  An abdominal angiogram with CO2 was obtained.  Next, using the omniflush catheter and a benson wire, the aortic bifurcation was crossed and the catheter was placed into theleft external iliac artery and left runoff was obtained.    Findings:    Aortogram: No obvious renal artery stenosis was visualized.  The infrarenal abdominal aorta is widely patent.  Bilateral common and external iliac arteries are widely patent.  Right Lower Extremity: Not evaluated given renal disease  Left Lower Extremity: The left common femoral and profundofemoral artery are widely patent.  The superficial femoral artery and its associated stents are occluded.  There is a bypass graft originating from the common femoral artery with very sluggish flow.  There is significant stenosis, long segment greater than 90% within the distal 7 cm of the vein bypass graft which plugs into the below-knee popliteal artery.  There is three-vessel runoff.  Intervention: After the above images were acquired, the decision made to proceed with intervention.  A 5 French 45 cm sheath was navigated into the femoral-popliteal bypass graft.  The patient was fully heparinized.  I then advanced a V-18 wire across the distal bypass graft into the below-knee popliteal artery.  I first treated this with a 3 x 100 Sterling balloon of the below-knee popliteal artery, distal anastomosis extending up into the vein bypass graft.  Follow-up imaging revealed improved but suboptimal results.  I then upsized to a 4 x 100 drug-coated Ranger balloon and performed drug-coated balloon angioplasty of the same areas.  The balloon was held up for 3 minutes.  Completion imaging revealed resolution of the stenosis.  At this point decision made to terminate procedure.  Catheters and wires were removed.  The long sheath was exchanged out for a short 5 French sheath and a Celt was used for closure without  complication.  Impression:  #1  Distal left femoral-popliteal bypass graft stenosis successfully treated using a 4 mm drug-coated balloon    V. [name], M.D., [facility] [place] Office: [phone] Pager:  [phone]

## 2022-03-09 NOTE — Interval H&P Note (Signed)
History and Physical Interval Note:  03/09/2022 12:27 PM  [name]  has presented today for surgery, with the diagnosis of critical limb ischemia with left leg ulcer.  The various methods of treatment have been discussed with the patient and family. After consideration of risks, benefits and other options for treatment, the patient has consented to  Procedure(s): ABDOMINAL AORTOGRAM W/LOWER EXTREMITY (N/A) as a surgical intervention.  The patient's history has been reviewed, patient examined, no change in status, stable for surgery.  I have reviewed the patient's chart and labs.  Questions were answered to the patient's satisfaction.     [name]

## 2022-03-10 ENCOUNTER — Encounter (HOSPITAL_COMMUNITY): Payer: Self-pay | Admitting: Surgery

## 2022-03-16 ENCOUNTER — Other Ambulatory Visit: Payer: Self-pay | Admitting: *Deleted

## 2022-03-16 DIAGNOSIS — I70243 Atherosclerosis of 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: Secondary | ICD-10-CM

## 2022-03-29 IMAGING — CT CT CHEST SUPER D W/O CM
2 of 5 series · 15 of 36 positions shown, 18 images · non-contrast
Comparison: 06/19/20

CLINICAL DATA: FOLLOW-UP LUNG NODULE

EXAM:
CT CHEST WITHOUT CONTRAST
TECHNIQUE: Multidetector CT imaging of the chest was performed using thin slice
collimation for electromagnetic bronchoscopy planning purposes,
without intravenous contrast.

[Series 4: chest 2.00 br40 s3 · coronal · 0.66mm/px · 3 of 154 slices shown]
[im 31/154  lung]
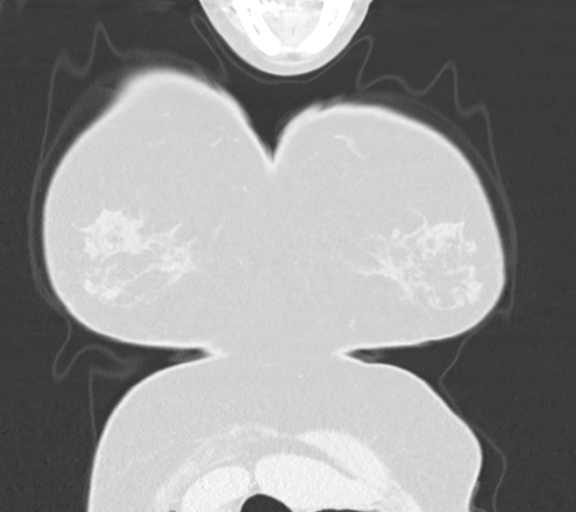
[im 62/154  lung]
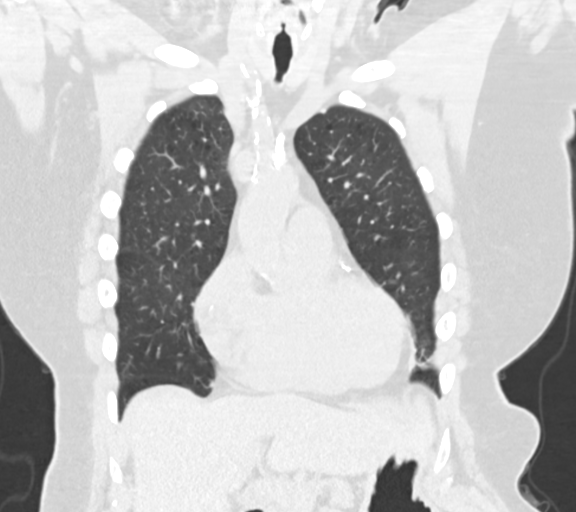
[im 92/154  lung]
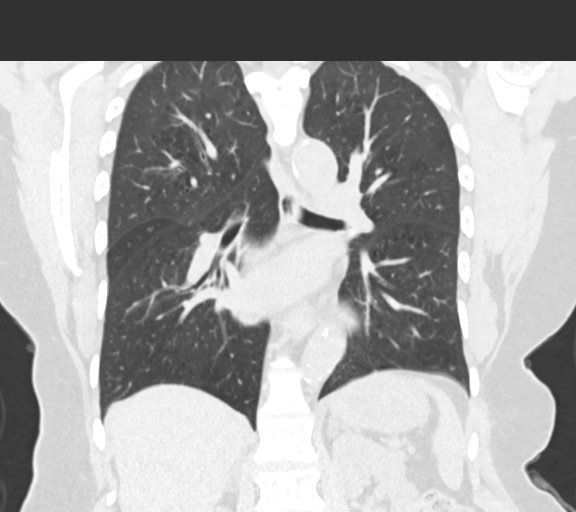

[Series 10: chest 1.00 br40 s3 super d · axial · 0.75mm/px · z∈[+1620,+1913]mm · 12 of 424 slices shown, 15 images]
[im 29/424  mediastinal]
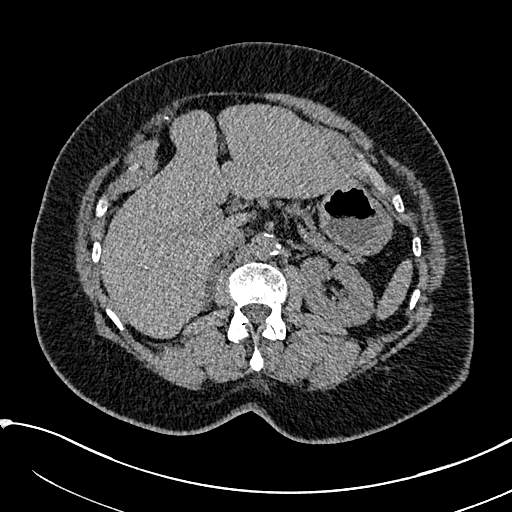
[im 29/424  lung]
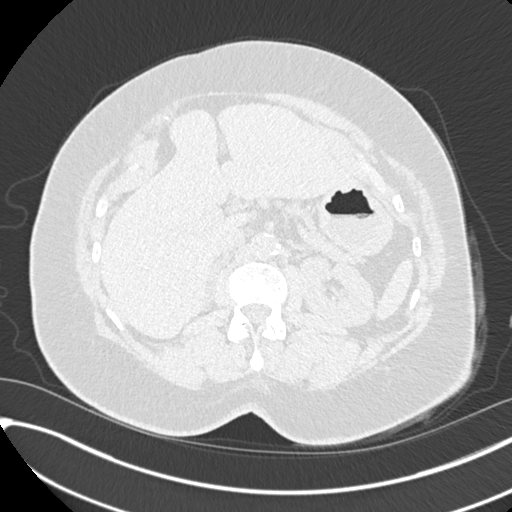
[im 57/424  lung]
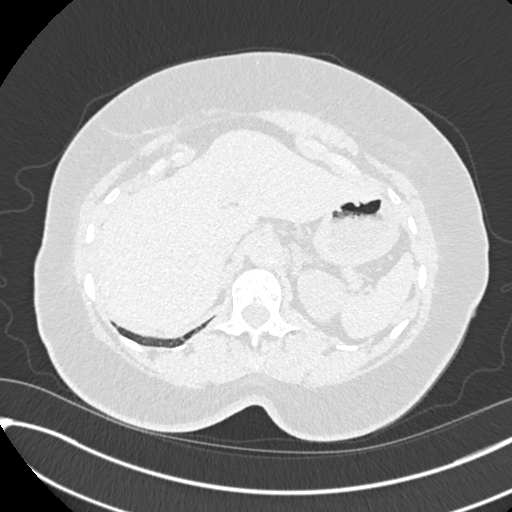
[im 85/424  lung]
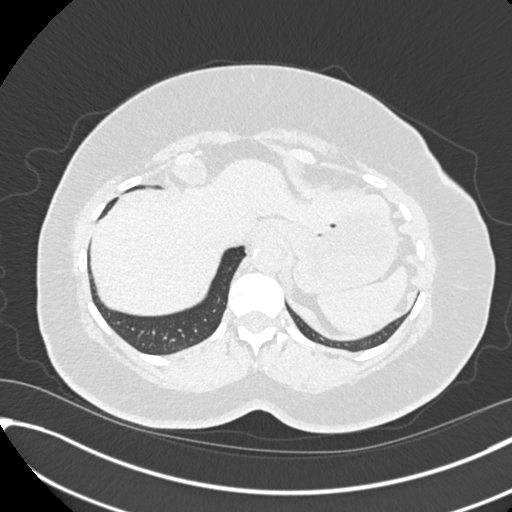
[im 142/424  lung]
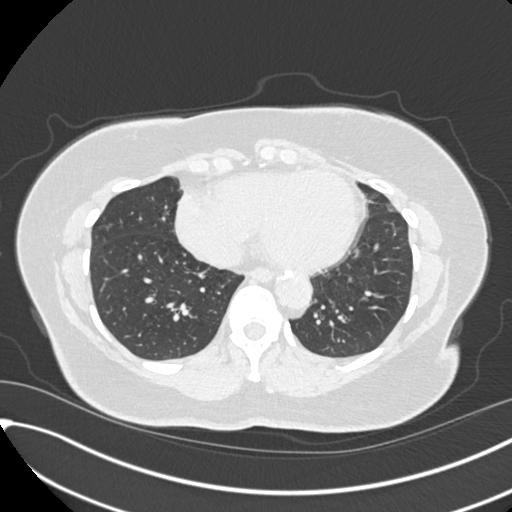
[im 170/424  mediastinal]
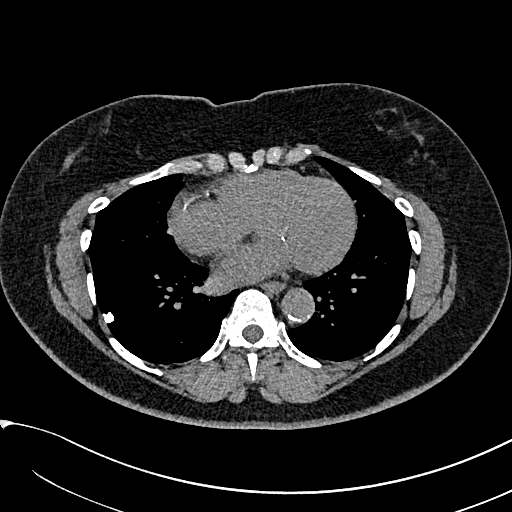
[im 170/424  lung]
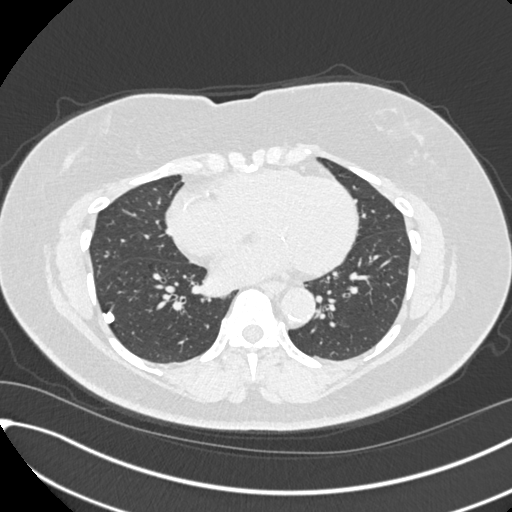
[im 198/424  lung]
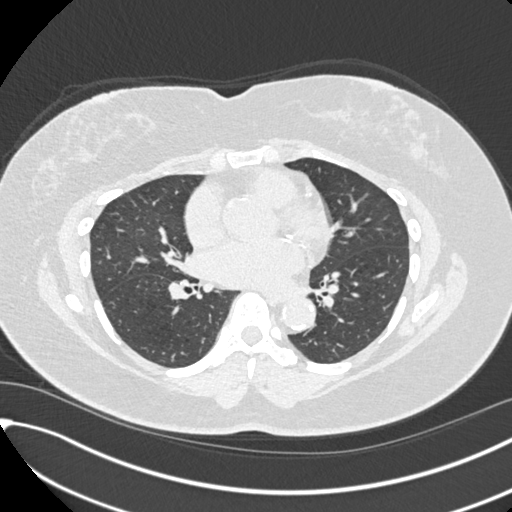
[im 226/424  lung]
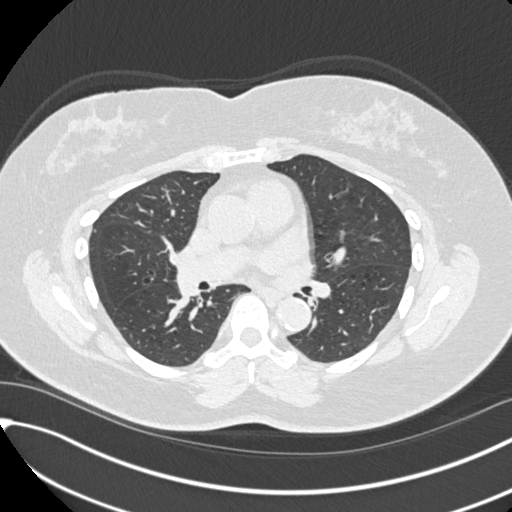
[im 254/424  lung]
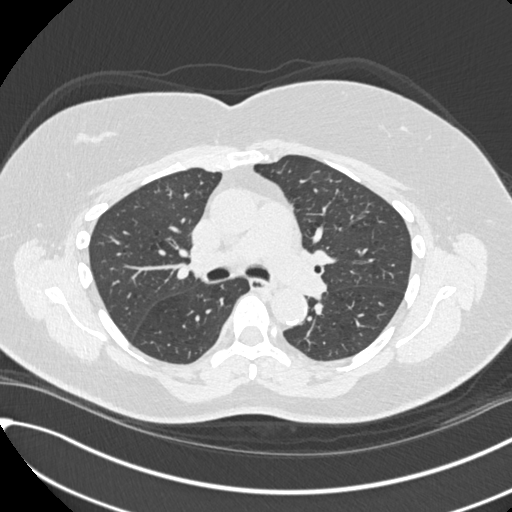
[im 283/424  mediastinal]
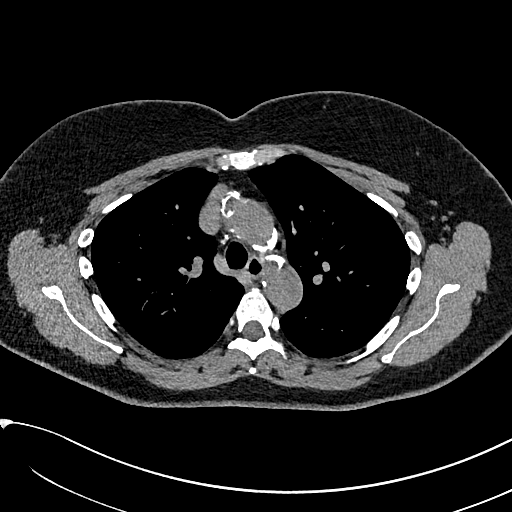
[im 283/424  lung]
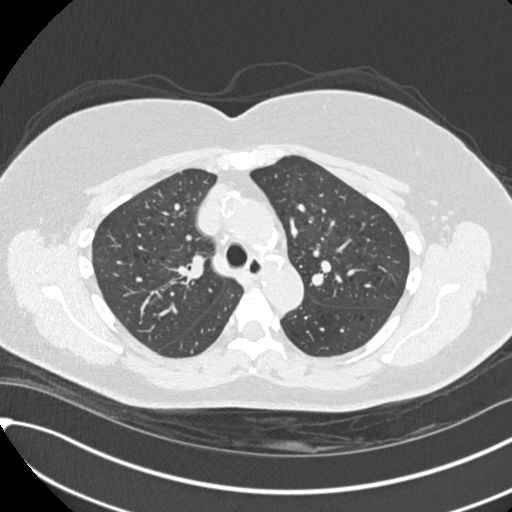
[im 339/424  lung]
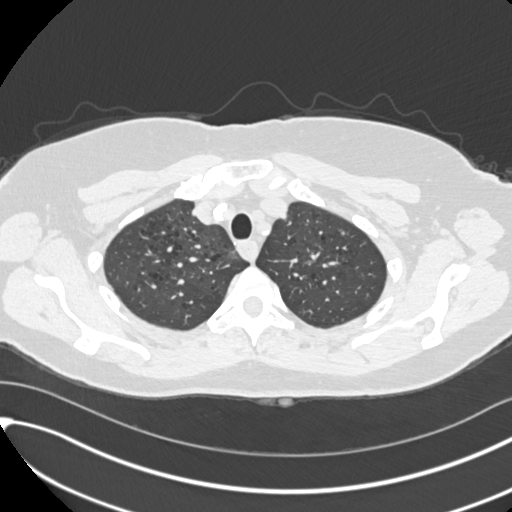
[im 367/424  lung]
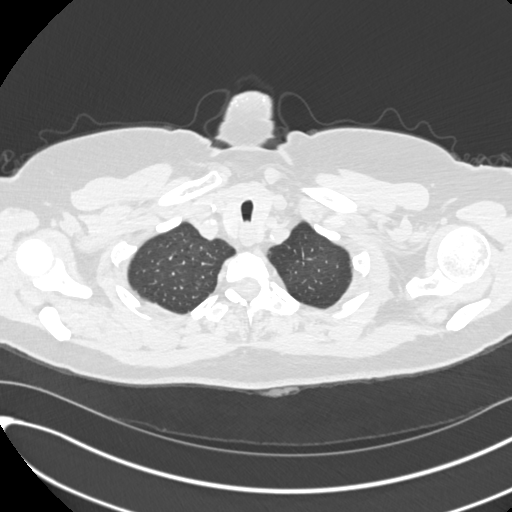
[im 395/424  lung]
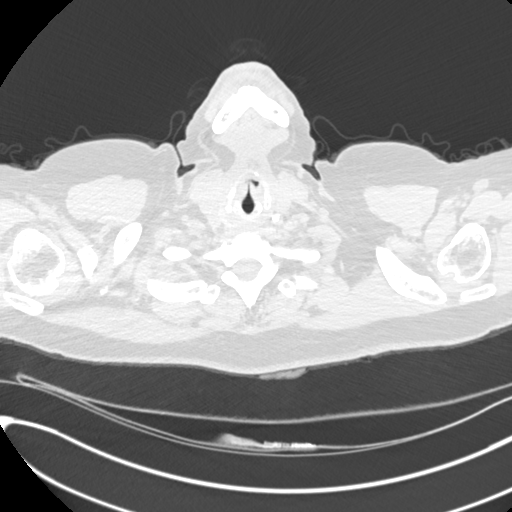

[15 of 36 positions shown; findings below may reference images not displayed]

FINDINGS: Cardiovascular: Mild cardiac enlargement. Aortic atherosclerosis.
Coronary artery atherosclerotic calcifications. No pericardial
effusion.

Mediastinum/Nodes: No enlarged mediastinal, hilar, or axillary lymph
nodes. Thyroid gland, trachea, and esophagus demonstrate no
significant findings.

Lungs/Pleura: Paraseptal and centrilobular emphysema. No pleural
effusion, airspace consolidation or atelectasis. Calcified granuloma
identified in the superior segment of right lower lobe. Within the
medial right apex there is a pure ground-glass attenuating nodule
which measures 1.6 x 1.3 cm, image [DATE]. Unchanged from previous
exam. No new or enlarging nodules.

Upper Abdomen: No acute abnormality.  Aortic atherosclerosis.

Musculoskeletal: No chest wall mass or suspicious bone lesions
identified.
IMPRESSION: 1. Stable appearance of pure ground-glass attenuating nodule within
the medial right apex. Adenocarcinoma cannot be excluded. Follow up
by CT is recommended in 12 months, with continued annual
surveillance for a minimum of 3 years.These recommendations are
taken from: Recommendations for the Management of Subsolid Pulmonary
Nodules Detected at CT: A Statement from the [HOSPITAL]
2. Aortic Atherosclerosis (79OZ4-QT3.3) and Emphysema (79OZ4-0BF.9).
3. Coronary artery calcifications.

## 2022-03-31 DIAGNOSIS — I129 Hypertensive chronic kidney disease with stage 1 through stage 4 chronic kidney disease, or unspecified chronic kidney disease: Secondary | ICD-10-CM | POA: Diagnosis not present

## 2022-03-31 DIAGNOSIS — N2581 Secondary hyperparathyroidism of renal origin: Secondary | ICD-10-CM | POA: Diagnosis not present

## 2022-03-31 DIAGNOSIS — R609 Edema, unspecified: Secondary | ICD-10-CM | POA: Diagnosis not present

## 2022-03-31 DIAGNOSIS — D631 Anemia in chronic kidney disease: Secondary | ICD-10-CM | POA: Diagnosis not present

## 2022-03-31 DIAGNOSIS — E785 Hyperlipidemia, unspecified: Secondary | ICD-10-CM | POA: Diagnosis not present

## 2022-03-31 DIAGNOSIS — I779 Disorder of arteries and arterioles, unspecified: Secondary | ICD-10-CM | POA: Diagnosis not present

## 2022-03-31 DIAGNOSIS — E1122 Type 2 diabetes mellitus with diabetic chronic kidney disease: Secondary | ICD-10-CM | POA: Diagnosis not present

## 2022-03-31 DIAGNOSIS — M6281 Muscle weakness (generalized): Secondary | ICD-10-CM | POA: Diagnosis not present

## 2022-03-31 DIAGNOSIS — N184 Chronic kidney disease, stage 4 (severe): Secondary | ICD-10-CM | POA: Diagnosis not present

## 2022-04-05 ENCOUNTER — Ambulatory Visit (INDEPENDENT_AMBULATORY_CARE_PROVIDER_SITE_OTHER): Payer: Medicare Other | Admitting: Orthopedic Surgery

## 2022-04-05 ENCOUNTER — Encounter: Payer: Self-pay | Admitting: Orthopedic Surgery

## 2022-04-05 DIAGNOSIS — I771 Stricture of artery: Secondary | ICD-10-CM | POA: Diagnosis not present

## 2022-04-05 DIAGNOSIS — I87332 Chronic venous hypertension (idiopathic) with ulcer and inflammation of left lower extremity: Secondary | ICD-10-CM

## 2022-04-05 DIAGNOSIS — L98499 Non-pressure chronic ulcer of skin of other sites with unspecified severity: Secondary | ICD-10-CM | POA: Diagnosis not present

## 2022-04-05 DIAGNOSIS — I779 Disorder of arteries and arterioles, unspecified: Secondary | ICD-10-CM

## 2022-04-05 NOTE — Progress Notes (Signed)
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  Patient was seen by vascular vein surgery underwent angioplasty and she states that her ulcer has completely healed.  Patient states she normally wears compression socks.  Assessment & Plan: Visit Diagnoses:  1. Chronic venous hypertension (idiopathic) with ulcer and inflammation of left lower extremity (HCC)   2. Arterial insufficiency with ischemic ulcer (HCC)     Plan: Continue with her compression socks follow-up as needed.  Follow-Up Instructions: Return if symptoms worsen or fail to improve.   Ortho Exam  Patient is alert, oriented, no adenopathy, well-dressed, normal affect, normal respiratory effort. Examination the mixed arterial venous ulcer medial aspect the left calf is completely healed.  Patient does have pitting edema but no drainage no cellulitis.  Imaging: No results found. No images are attached to the encounter.  Labs: Lab Results  Component Value Date   HGBA1C 7.6 (A) 01/22/2022   HGBA1C 7.1 (H) 11/17/2021   HGBA1C 8.5 (H) 08/04/2021   ESRSEDRATE 11 06/06/2018   ESRSEDRATE 12 05/01/2009   LABURIC 4.6 04/18/2018   REPTSTATUS 07/25/2017 FINAL 07/23/2017   CULT >=100,000 COLONIES/mL ESCHERICHIA COLI (A) 07/23/2017   LABORGA ESCHERICHIA COLI (A) 07/23/2017     Lab Results  Component Value Date   ALBUMIN 3.2 (L) 11/18/2021   ALBUMIN 3.5 10/28/2020   ALBUMIN 3.0 (L) 01/27/2017    Lab Results  Component Value Date   MG 2.2 08/04/2021   MG 2.5 08/04/2020   MG 1.9 11/13/2018    Lab Results  Component Value Date   VD25OH 21 (L) 08/04/2020   VD25OH 27 (L) 04/18/2018   VD25OH 22 (L) 07/22/2016    No results found for: "PREALBUMIN"    Latest Ref Rng & Units 03/09/2022   10:44 AM 11/19/2021    1:54 AM 11/17/2021    1:42 PM  CBC EXTENDED  WBC 4.0 - 10.5 K/uL  13.0  10.2   RBC 3.87 - 5.11 MIL/uL  3.80  4.49   Hemoglobin 12.0 - 15.0 g/dL 15.0  12.6  15.0   HCT 36.0 - 46.0 % 44.0  38.6  45.4   Platelets 150 - 400 K/uL  225  336      There is no height or weight on file to calculate BMI.  Orders:  No orders of the defined types were placed in this encounter.  No orders of the defined types were placed in this encounter.    Procedures: No procedures performed  Clinical Data: No additional findings.  ROS:  All other systems negative, except as noted in the HPI. Review of Systems  Objective: Vital Signs: There were no vitals taken for this visit.  Specialty Comments:  No specialty comments available.  PMFS History: Patient Active Problem List   Diagnosis Date Noted   PVD (peripheral vascular disease) ([place]) 01/10/2022   Atherosclerosis of native arteries of left leg with  ulceration of ankle ([place]) 12/11/2021   Pain in right knee 01/07/2020   Carotid artery stenosis 12/03/2019   Migraine with aura and without status migrainosus, not intractable 11/28/2019   Hyperlipidemia associated with type 2 diabetes mellitus ([place]) 11/10/2019   Coronary artery calcification 08/05/2019   PAOD (peripheral arterial occlusive disease) ([place]) 02/26/2019   SOB (shortness of breath) 11/17/2018   Emphysema of lung ([place]) 10/10/2017   Atherosclerosis of aorta ([place]) 10/10/2017   Lung nodule 10/10/2017   Anxiety 08/22/2017   Arthritis 08/22/2017   Neuropathy due to secondary diabetes ([place]) 08/22/2017   Type 2 diabetes mellitus with circulatory disorder, with long-term current use of insulin ([place]) 11/07/2015   Encounter for Medicare annual wellness exam 06/24/2015    Generalized anxiety disorder 03/26/2015   Depression, major, recurrent, in partial remission ([place]) 03/26/2015   History of CVA (cerebrovascular accident) 10/04/2014   Tobacco abuse 10/04/2014   Vitamin D deficiency 11/02/2013   Medication management 11/02/2013   Peripheral vascular disease due to secondary diabetes mellitus ([place]) 05/08/2012   Atherosclerosis of native arteries of extremity with intermittent claudication 12/24/2011   Diverticulosis of large intestine 06/09/2010   History of colonic polyps 06/09/2010   Esophageal reflux 08/27/2008   Nontoxic multinodular goiter 03/11/2008   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[place]) 03/11/2008   Brain aneurysm 03/11/2008   CKD stage 4 due to type 2 diabetes mellitus ([place]) 05/01/2007   Essential hypertension 05/01/2007   Past Medical History:  Diagnosis Date   Abnormal findings on esophagogastroduodenoscopy (EGD) 07/2010   Aneurysm ([place]) 2003   in brain x 2,  "small"   Anxiety    Arthritis    Cataract    CKD (chronic kidney disease), stage IV ([place])    followed by [facility]   Colon polyps    Diabetes mellitus 1998   Diverticulosis    Emphysema of lung ([place])    "no one has evry told me that"   ESOPHAGEAL STRICTURE 08/27/2008   Family history of adverse reaction to anesthesia    daughter n/v   GERD (gastroesophageal reflux disease)    Hemorrhoids    Hiatal hernia    Hyperlipidemia    Hypertension 2000   Migraines    Neuropathy    fingers and toe   Peripheral vascular disease ([place])    Phlebitis    30 years ago  left leg   Stroke [facility])    multiple mini strokes ( brain aneurysm ).  Right side weaker.    Family History  Problem Relation Age of Onset   CAD Mother 55       Died of MI   Hypertension Mother    Heart attack Mother    Heart disease Mother    CAD Father 56       Died of MI   Heart disease Father    Heart attack Father    CAD Brother 80       Two brothers died of MI   Heart attack Brother    Heart  disease Brother        Amputation   Diabetes Sister    Hypertension Sister    Heart attack Brother    Heart disease Brother    Stroke Sister    Colon cancer Neg Hx    Stomach cancer Neg Hx    Esophageal cancer Neg Hx     Past Surgical History:  Procedure Laterality Date   ABDOMINAL AORTAGRAM N/A 01/04/2012   Procedure: ABDOMINAL AORTAGRAM;  Surgeon: Serafina Mitchell, MD;  Location: [facility] CATH LAB;  Service: Cardiovascular;  Laterality: N/A;   ABDOMINAL AORTAGRAM N/A 08/15/2012   Procedure: ABDOMINAL Maxcine Ham;  Surgeon: Serafina Mitchell, MD;  Location: [facility] CATH LAB;  Service: Cardiovascular;  Laterality: N/A;   ABDOMINAL AORTOGRAM W/LOWER EXTREMITY Left 11/17/2021   Procedure: ABDOMINAL AORTOGRAM W/LOWER EXTREMITY;  Surgeon: Serafina Mitchell, MD;  Location: [facility] CV LAB;  Service: Cardiovascular;  Laterality: Left;   ABDOMINAL AORTOGRAM W/LOWER EXTREMITY N/A 03/09/2022   Procedure: ABDOMINAL AORTOGRAM W/LOWER EXTREMITY;  Surgeon: Serafina Mitchell, MD;  Location: [facility] CV LAB;  Service: Cardiovascular;  Laterality: N/A;   ABDOMINAL HYSTERECTOMY  1984   cataract surgery Right 10-22-2015   cataract surgery Left 11-05-2015   CESAREAN SECTION     CHOLECYSTECTOMY     COLONOSCOPY  07/2010   DENTAL SURGERY  Aug. 16, 2013   left lower    ESOPHAGOGASTRODUODENOSCOPY  07/2010   EYE SURGERY  Nov. 2014   Laser-Glaucoma   FEMORAL ARTERY STENT  05/11/11   Left superficial femoral and popliteal artery   FEMORAL-POPLITEAL BYPASS GRAFT Left 11/18/2021   Procedure: LEFT FEMORAL-POPLITEAL ARTERY BYPASS GRAFT;  Surgeon: Serafina Mitchell, MD;  Location: [facility];  Service: Vascular;  Laterality: Left;   HERNIA REPAIR     times two   KNEE SURGERY     LOWER EXTREMITY ANGIOGRAM Left 08/15/2012   Procedure: LOWER EXTREMITY ANGIOGRAM;  Surgeon: Serafina Mitchell, MD;  Location[facility] CATH LAB;  Service: Cardiovascular;  Laterality: Left;  lt leg angio   PERIPHERAL VASCULAR BALLOON ANGIOPLASTY Left 03/09/2022    Procedure: PERIPHERAL VASCULAR BALLOON ANGIOPLASTY;  Surgeon: Serafina Mitchell, MD;  Location: [facility] CV LAB;  Service: Cardiovascular;  Laterality: Left;   rotator cuff surgery     THYROID SURGERY     Social History   Occupational History   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  Tobacco Use   Smoking status: Light Smoker    Packs/day: 0.40    Years: 40.00    Total pack years: 16.00    Types: Cigarettes   Smokeless tobacco: Never   Tobacco comments:    11/18/20 in process of quiting  Vaping Use   Vaping Use: Never used  Substance and Sexual Activity   Alcohol use: No    Alcohol/week: 0.0 standard drinks of alcohol   Drug use: Never   Sexual activity: Not on file

## 2022-04-15 ENCOUNTER — Encounter: Payer: Self-pay | Admitting: Nurse Practitioner

## 2022-04-15 ENCOUNTER — Ambulatory Visit (INDEPENDENT_AMBULATORY_CARE_PROVIDER_SITE_OTHER): Payer: Medicare Other | Admitting: Nurse Practitioner

## 2022-04-15 VITALS — BP 118/68 | HR 66 | Temp 96.9°F | Ht 66.0 in | Wt 184.2 lb

## 2022-04-15 DIAGNOSIS — E785 Hyperlipidemia, unspecified: Secondary | ICD-10-CM | POA: Diagnosis not present

## 2022-04-15 DIAGNOSIS — I10 Essential (primary) hypertension: Secondary | ICD-10-CM

## 2022-04-15 DIAGNOSIS — I779 Disorder of arteries and arterioles, unspecified: Secondary | ICD-10-CM

## 2022-04-15 DIAGNOSIS — K219 Gastro-esophageal reflux disease without esophagitis: Secondary | ICD-10-CM

## 2022-04-15 DIAGNOSIS — E1169 Type 2 diabetes mellitus with other specified complication: Secondary | ICD-10-CM

## 2022-04-15 MED ORDER — ROSUVASTATIN CALCIUM 40 MG PO TABS: ORAL_TABLET | ORAL | 1 refills | Status: DC

## 2022-04-15 MED ORDER — OLMESARTAN MEDOXOMIL 40 MG PO TABS: 40.0000 mg | ORAL_TABLET | Freq: Every day | ORAL | 3 refills | Status: DC

## 2022-04-15 MED ORDER — OMEPRAZOLE 20 MG PO CPDR: 20.0000 mg | DELAYED_RELEASE_CAPSULE | Freq: Two times a day (BID) | ORAL | 1 refills | Status: DC

## 2022-04-15 MED ORDER — AMLODIPINE BESYLATE 10 MG PO TABS: 10.0000 mg | ORAL_TABLET | Freq: Every day | ORAL | 3 refills | Status: DC

## 2022-04-15 NOTE — Progress Notes (Signed)
Assessment and Plan:  Diagnoses and all orders for this visit:  Gastroesophageal reflux disease, unspecified whether esophagitis present Symptoms have been worse with Omeprazole 40 mg QHS.  Increase to Omeprazole 20 mg BID Avoid dietary triggers -     omeprazole (PRILOSEC) 20 MG capsule; Take 1 capsule (20 mg total) by mouth 2 (two) times daily before a meal.  Essential hypertension - continue medications, DASH diet, exercise and monitor at home. Call if greater than 130/80.   Go to the ER if any chest pain, shortness of breath, nausea, dizziness, severe HA, changes vision/speech  -     olmesartan (BENICAR) 40 MG tablet; Take 1 tablet (40 mg total) by mouth daily. for blood pressure -     amLODipine (NORVASC) 10 MG tablet; Take 1 tablet (10 mg total) by mouth daily. for blood pressure  Hyperlipidemia associated with type 2 diabetes mellitus ([place]) Was having severe myalgias on Atorvastatin, had not previously noted this with Rosuvastatin 40 mg - will restart Rosuvastatin 40 mg daily today -     rosuvastatin (CRESTOR) 40 MG tablet; TAKE 1 /2 TO 1 TABLET DAILY FOR CHOLESTEROL       Further disposition pending results of labs. Discussed med's effects and SE's.   Over 30 minutes of exam, counseling, chart review, and critical decision making was performed.   Future Appointments  Date Time Provider [place]  04/19/2022  8:00 AM [facility] VASC 3 - EM [facility] V[facility]  04/19/2022  8:30 AM [facility] VASC 3 - EM [facility]  05/10/2022  9:00 AM [facility] PA [facility]  05/31/2022  9:40 AM Philemon Kingdom, MD [facility] None  08/04/2022  2:00 PM Alycia Rossetti, NP [facility] None    ------------------------------------------------------------------------------------------------------------------   HPI BP 118/68   Pulse 66   Temp (!) 96.9 F (36.1 C)   Ht _0  (1.676 m)   Wt 184 lb 3.2 oz (83.6 kg)   SpO2 95%   BMI 29.73 kg/m   76 y.o.female presents for medication  discussion. She is having a lot of muscle pains related to Atorvastatin 30m.  Unable to tolerate this medication.  She tolerated Rosuvastatin 40 mg much better was switched in hospital not sure why.    BMI is Body mass index is 29.73 kg/m., she has been working on diet and exercise. Wt Readings from Last 3 Encounters:  04/15/22 184 lb 3.2 oz (83.6 kg)  03/09/22 181 lb (82.1 kg)  03/01/22 181 lb 11.2 oz (82.4 kg)   She is currently on Amlodipine 10 mg , olmesartan 40 mg QD. She is taking chlorthalidone 25 mg every other day.  Denies chest pain, shortness of breath, dizziness and headaches.  BP Readings from Last 3 Encounters:  04/15/22 118/68  03/09/22 (!) 163/51  03/01/22 135/65    Past Medical History:  Diagnosis Date   Abnormal findings on esophagogastroduodenoscopy (EGD) 07/2010   Aneurysm ([place] 2003   in brain x 2,  "small"   Anxiety    Arthritis    Cataract    CKD (chronic kidney disease), stage IV ([facility]    [facility]   Colon polyps    Diabetes mellitus 1998   Diverticulosis    Emphysema of lung ([place]    "no one has evry told me that"   ESOPHAGEAL STRICTURE 08/27/2008   Family history of adverse reaction to anesthesia    daughter n/v   GERD (gastroesophageal reflux disease)    Hemorrhoids  Hiatal hernia    Hyperlipidemia    Hypertension 2000   Migraines    Neuropathy    fingers and toe   Peripheral vascular disease (HCC)    Phlebitis    30 years ago  left leg   Stroke [facility])    multiple mini strokes ( brain aneurysm ).  Right side weaker.     Allergies  Allergen Reactions   Ciprofloxacin Swelling   Ozempic (0.25 Or 0.5 Mg-Dose) [Semaglutide(0.25 Or 0.46m-Dos)] Nausea And Vomiting   Plavix [Clopidogrel Bisulfate] Palpitations   Amoxicillin Itching and Swelling    FACE & EYES SWELL   Ace Inhibitors Cough    Sore throat   Atorvastatin     myalgias   Lyrica [Pregabalin] Itching and Nausea Only     gain weight   Penicillins Other (See  Comments)    Pt unsure if there is an allergic reaction     Current Outpatient Medications on File Prior to Visit  Medication Sig   aspirin 325 MG EC tablet Take 325 mg by mouth at bedtime.   b complex vitamins capsule Take 2 capsules by mouth daily.   Blood Glucose Monitoring Suppl (ONETOUCH VERIO FLEX SYSTEM) w/Device KIT USE AS ADVISED   chlorthalidone (HYGROTON) 25 MG tablet Take 25 mg by mouth every other day. At night   citalopram (CELEXA) 40 MG tablet Take 40 mg by mouth every morning.   Coenzyme Q10 (COQ10) 100 MG CAPS Take 100 mg by mouth daily.   dapagliflozin propanediol (FARXIGA) 10 MG TABS tablet TAKE 1 TABLET BY MOUTH EVERY DAY BEFORE BREAKFAST.   Ginkgo Biloba 120 MG TABS Take 120 mg by mouth daily.   glucose blood test strip Use as instructed 3x a day   insulin aspart (NOVOLOG FLEXPEN) 100 UNIT/ML FlexPen Inject 10-14 Units into the skin 3 (three) times daily before meals. (Patient taking differently: Inject 10-12 Units into the skin 2 (two) times daily before a meal. Sliding scale)   insulin degludec (TRESIBA FLEXTOUCH) 200 UNIT/ML FlexTouch Pen Inject 40 Units into the skin daily.   Insulin Pen Needle 29G X 5MM MISC Use with insulin pen 3 times a day.   meclizine (ANTIVERT) 25 MG tablet 1/2 tab up to 3 times daily for motion sickness/dizziness. (Patient taking differently: Take 12.5 mg by mouth 3 (three) times daily as needed for dizziness. 1/2 tab up to 3 times daily for motion sickness/dizziness.)   Omega-3 Fatty Acids (FISH OIL) 1000 MG CAPS Take 1,000 mg by mouth every morning.   OneTouch Delica Lancets 379KMISC Use 3x a day   OVER THE COUNTER MEDICATION Take 2 tablets by mouth 2 (two) times daily before a meal. Gluco Revolve   OVER THE COUNTER MEDICATION Take 1 capsule by mouth daily. Brain Booster   Polyethyl Glycol-Propyl Glycol (SYSTANE ULTRA OP) Place 1 drop into both eyes daily at 12 noon. Additional drop if needed for dry eyes   rOPINIRole (REQUIP) 0.25 MG tablet  Take 1 tablet (0.25 mg total) by mouth at bedtime.   No current facility-administered medications on file prior to visit.    ROS: all negative except above.   Physical Exam:  BP 118/68   Pulse 66   Temp (!) 96.9 F (36.1 C)   Ht _0  (1.676 m)   Wt 184 lb 3.2 oz (83.6 kg)   SpO2 95%   BMI 29.73 kg/m   General Appearance: Well nourished, in no apparent distress. Eyes: PERRLA, EOMs, conjunctiva no swelling or  erythema Sinuses: No Frontal/maxillary tenderness ENT/Mouth: Ext aud canals clear, TMs without erythema, bulging. No erythema, swelling, or exudate on post pharynx.  Tonsils not swollen or erythematous. Hearing normal.  Neck: Supple, thyroid normal.  Respiratory: Respiratory effort normal, BS equal bilaterally without rales, rhonchi, wheezing or stridor.  Cardio: RRR with no MRGs. Brisk peripheral pulses without edema.  Abdomen: Soft, + BS.  Non tender, no guarding, rebound, hernias, masses. Lymphatics: Non tender without lymphadenopathy.  Musculoskeletal: Full ROM, 5/5 strength, antalgic gait Skin: Warm, dry without rashes, lesions, ecchymosis.  Neuro: Cranial nerves intact. Normal muscle tone, no cerebellar symptoms. Sensation intact.  Psych: Awake and oriented X 3, normal affect, Insight and Judgment appropriate.     Alycia Rossetti, NP 3:19 PM [facility]

## 2022-04-19 ENCOUNTER — Ambulatory Visit (INDEPENDENT_AMBULATORY_CARE_PROVIDER_SITE_OTHER)
Admission: RE | Admit: 2022-04-19 | Discharge: 2022-04-19 | Disposition: A | Discharge status: Home / Self Care | Payer: Medicare Other | Source: Ambulatory Visit | Attending: Physician Assistant | Admitting: Physician Assistant

## 2022-04-19 ENCOUNTER — Ambulatory Visit (HOSPITAL_COMMUNITY)
Admission: RE | Admit: 2022-04-19 | Discharge: 2022-04-19 | Disposition: A | Discharge status: Home / Self Care | Payer: Medicare Other | Source: Ambulatory Visit | Attending: Physician Assistant | Admitting: Physician Assistant

## 2022-04-19 DIAGNOSIS — I70243 Atherosclerosis of 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: Secondary | ICD-10-CM | POA: Diagnosis not present

## 2022-05-05 ENCOUNTER — Other Ambulatory Visit: Payer: Self-pay | Admitting: Nurse Practitioner

## 2022-05-05 DIAGNOSIS — G2581 Restless legs syndrome: Secondary | ICD-10-CM

## 2022-05-16 NOTE — Progress Notes (Unsigned)
[facility] [place] HISTORY AND PHYSICAL   History of Present Illness:  Patient is a 76 y.o. year old female who presents for evaluation of PAD.   She has history of left SFA stenting in 2012.  In-stent stenosis was treated with balloon angioplasty however ultimately failed.  She then required left femoral to below the knee popliteal bypass with vein by Dr. Trula Slade on 11/18/2021.  This was performed due to left medial ankle wound which patient states is nearly healed.  This is followed by Dr. Sharol Given.  She denies claudication, rest pain, or other wounds of bilateral lower extremities.  She is on aspirin and statin daily.  He follow up studies showed Patent left leg bypass however there are low flow velocities throughout.  She was set up for reapeat angiogram with Drug-coated balloon angioplasty, left femoral to below-knee popliteal artery bypass.    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)    "no one has evry told me that"   ESOPHAGEAL STRICTURE 08/27/2008   Family history of adverse reaction to anesthesia    daughter n/v   GERD (gastroesophageal reflux disease)    Hemorrhoids    Hiatal hernia    Hyperlipidemia    Hypertension 2000   Migraines    Neuropathy    fingers and toe   Peripheral vascular disease ([place])    Phlebitis    30 years ago  left leg   Stroke [facility])    multiple mini strokes ( brain aneurysm ).  Right side weaker.    Past Surgical History:  Procedure Laterality Date   ABDOMINAL AORTAGRAM N/A 01/04/2012   Procedure: ABDOMINAL AORTAGRAM;  Surgeon: Serafina Mitchell, MD;  Location: [facility] CATH LAB;  Service: Cardiovascular;  Laterality: N/A;    ABDOMINAL AORTAGRAM N/A 08/15/2012   Procedure: ABDOMINAL Maxcine Ham;  Surgeon: Serafina Mitchell, MD;  Location: [facility] CATH LAB;  Service: Cardiovascular;  Laterality: N/A;   ABDOMINAL AORTOGRAM W/LOWER EXTREMITY Left 11/17/2021   Procedure: ABDOMINAL AORTOGRAM W/LOWER EXTREMITY;  Surgeon: Serafina Mitchell, MD;  Location: [place] CV LAB;  Service: Cardiovascular;  Laterality: Left;   ABDOMINAL AORTOGRAM W/LOWER EXTREMITY N/A 03/09/2022   Procedure: ABDOMINAL AORTOGRAM W/LOWER EXTREMITY;  Surgeon: Serafina Mitchell, MD;  Location: [place] CV LAB;  Service: Cardiovascular;  Laterality: N/A;   ABDOMINAL HYSTERECTOMY  1984   cataract surgery Right 10-22-2015   cataract surgery Left 11-05-2015   CESAREAN SECTION     CHOLECYSTECTOMY     COLONOSCOPY  07/2010   DENTAL SURGERY  Aug. 16, 2013   left lower    ESOPHAGOGASTRODUODENOSCOPY  07/2010   EYE SURGERY  Nov. 2014   Laser-Glaucoma   FEMORAL ARTERY STENT  05/11/11   Left superficial femoral and popliteal artery   FEMORAL-POPLITEAL BYPASS GRAFT Left 11/18/2021   Procedure: LEFT FEMORAL-POPLITEAL ARTERY BYPASS GRAFT;  Surgeon: Serafina Mitchell, MD;  Location: [place];  Service: Vascular;  Laterality: Left;   HERNIA REPAIR     times two   [place] Left 08/15/2012   Procedure: LOWER  EXTREMITY ANGIOGRAM;  Surgeon: Serafina Mitchell, MD;  Location: [facility] CATH LAB;  Service: Cardiovascular;  Laterality: Left;  lt leg angio   PERIPHERAL VASCULAR BALLOON ANGIOPLASTY Left 03/09/2022   Procedure: PERIPHERAL VASCULAR BALLOON ANGIOPLASTY;  Surgeon: Serafina Mitchell, MD;  Location: [place] CV LAB;  Service: Cardiovascular;  Laterality: Left;   rotator cuff surgery     THYROID SURGERY      ROS:   General:  No weight loss, Fever, chills  HEENT: No recent headaches, no nasal bleeding, no visual changes, no sore throat  Neurologic: No dizziness, blackouts, seizures. No recent symptoms of stroke or mini- stroke. No recent  episodes of slurred speech, or temporary blindness.  Cardiac: No recent episodes of chest pain/pressure, no shortness of breath at rest.  No shortness of breath with exertion.  Denies history of atrial fibrillation or irregular heartbeat  Vascular: No history of rest pain in feet.  No history of claudication.  No history of non-healing ulcer, No history of DVT   Pulmonary: No home oxygen, no productive cough, no hemoptysis,  No asthma or wheezing  Musculoskeletal:  [ ]  Arthritis, [ ]  Low back pain,  [ ]  Joint pain  Hematologic:No history of hypercoagulable state.  No history of easy bleeding.  No history of anemia  Gastrointestinal: No hematochezia or melena,  No gastroesophageal reflux, no trouble swallowing  Urinary: [ ]  chronic Kidney disease, [ ]  on HD - [ ]  MWF or [ ]  TTHS, [ ]  Burning with urination, [ ]  Frequent urination, [ ]  Difficulty urinating;   Skin: No rashes  Psychological: No history of anxiety,  No history of depression  Social History Social History   Tobacco Use   Smoking status: Light Smoker    Packs/day: 0.40    Years: 40.00    Total pack years: 16.00    Types: Cigarettes   Smokeless tobacco: Never   Tobacco comments:    11/18/20 in process of quiting  Vaping Use   Vaping Use: Never used  Substance Use Topics   Alcohol use: No    Alcohol/week: 0.0 standard drinks of alcohol   Drug use: Never    Family History Family History  Problem Relation Age of Onset   CAD Mother 56       Died of MI   Hypertension Mother    Heart attack Mother    Heart disease Mother    CAD Father 63       Died of MI   Heart disease Father    Heart attack Father    CAD Brother 30       Two brothers died of MI   Heart attack Brother    Heart disease Brother        Amputation   Diabetes Sister    Hypertension Sister    Heart attack Brother    Heart disease Brother    Stroke Sister    Colon cancer Neg Hx    Stomach cancer Neg Hx    Esophageal cancer Neg Hx      Allergies  Allergies  Allergen Reactions   Ciprofloxacin Swelling   Ozempic (0.25 Or 0.5 Mg-Dose) [Semaglutide(0.25 Or 0.90m-Dos)] Nausea And Vomiting   Plavix [Clopidogrel Bisulfate] Palpitations   Amoxicillin Itching and Swelling    FACE & EYES SWELL   Ace Inhibitors Cough    Sore throat   Atorvastatin     myalgias   Lyrica [Pregabalin] Itching and Nausea Only     gain  weight   Penicillins Other (See Comments)    Pt unsure if there is an allergic reaction      Current Outpatient Medications  Medication Sig Dispense Refill   amLODipine (NORVASC) 10 MG tablet Take 1 tablet (10 mg total) by mouth daily. for blood pressure 90 tablet 3   aspirin 325 MG EC tablet Take 325 mg by mouth at bedtime.     b complex vitamins capsule Take 2 capsules by mouth daily.     Blood Glucose Monitoring Suppl (ONETOUCH VERIO FLEX SYSTEM) w/Device KIT USE AS ADVISED 1 kit 0   chlorthalidone (HYGROTON) 25 MG tablet Take 25 mg by mouth every other day. At night     citalopram (CELEXA) 40 MG tablet Take 40 mg by mouth every morning.     Coenzyme Q10 (COQ10) 100 MG CAPS Take 100 mg by mouth daily.     dapagliflozin propanediol (FARXIGA) 10 MG TABS tablet TAKE 1 TABLET BY MOUTH EVERY DAY BEFORE BREAKFAST. 90 tablet 1   Ginkgo Biloba 120 MG TABS Take 120 mg by mouth daily.     glucose blood test strip Use as instructed 3x a day 200 each 12   insulin aspart (NOVOLOG FLEXPEN) 100 UNIT/ML FlexPen Inject 10-14 Units into the skin 3 (three) times daily before meals. (Patient taking differently: Inject 10-12 Units into the skin 2 (two) times daily before a meal. Sliding scale) 45 mL 1   insulin degludec (TRESIBA FLEXTOUCH) 200 UNIT/ML FlexTouch Pen Inject 40 Units into the skin daily. 27 mL 1   Insulin Pen Needle 29G X 5MM MISC Use with insulin pen 3 times a day. 300 each 11   meclizine (ANTIVERT) 25 MG tablet 1/2 tab up to 3 times daily for motion sickness/dizziness. (Patient taking differently: Take 12.5 mg  by mouth 3 (three) times daily as needed for dizziness. 1/2 tab up to 3 times daily for motion sickness/dizziness.) 30 tablet 0   olmesartan (BENICAR) 40 MG tablet Take 1 tablet (40 mg total) by mouth daily. for blood pressure 90 tablet 3   Omega-3 Fatty Acids (FISH OIL) 1000 MG CAPS Take 1,000 mg by mouth every morning.     omeprazole (PRILOSEC) 20 MG capsule Take 1 capsule (20 mg total) by mouth 2 (two) times daily before a meal. 180 capsule 1   OneTouch Delica Lancets 92Z MISC Use 3x a day 200 each 3   OVER THE COUNTER MEDICATION Take 2 tablets by mouth 2 (two) times daily before a meal. Gluco Revolve     OVER THE COUNTER MEDICATION Take 1 capsule by mouth daily. Brain Booster     Polyethyl Glycol-Propyl Glycol (SYSTANE ULTRA OP) Place 1 drop into both eyes daily at 12 noon. Additional drop if needed for dry eyes     rOPINIRole (REQUIP) 0.25 MG tablet TAKE 1 TABLET BY MOUTH AT BEDTIME. 90 tablet 2   rosuvastatin (CRESTOR) 40 MG tablet TAKE 1 /2 TO 1 TABLET DAILY FOR CHOLESTEROL 90 tablet 1   No current facility-administered medications for this visit.    Physical Examination  There were no vitals filed for this visit.  There is no height or weight on file to calculate BMI.  General:  Alert and oriented, no acute distress HEENT: Normal Neck: No bruit or JVD Pulmonary: Clear to auscultation bilaterally Cardiac: Regular Rate and Rhythm without murmur Abdomen: Soft, non-tender, non-distended, no mass, no scars Skin: No rash Extremity Pulses:  2+ radial, brachial, femoral, dorsalis pedis, posterior tibial pulses bilaterally  Musculoskeletal: No deformity or edema  Neurologic: Upper and lower extremity motor 5/5 and symmetric  DATA: ***   ASSESSMENT: ***   PLAN: ***   Ruta Hinds, MD [facility] [place] Office: [phone] Pager: [phone]

## 2022-05-17 ENCOUNTER — Ambulatory Visit (INDEPENDENT_AMBULATORY_CARE_PROVIDER_SITE_OTHER): Payer: Medicare Other | Admitting: Physician Assistant

## 2022-05-17 VITALS — BP 141/65 | HR 58 | Temp 97.6°F | Resp 16 | Ht 66.0 in | Wt 183.0 lb

## 2022-05-17 DIAGNOSIS — I779 Disorder of arteries and arterioles, unspecified: Secondary | ICD-10-CM

## 2022-05-17 DIAGNOSIS — I70243 Atherosclerosis of 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: Secondary | ICD-10-CM

## 2022-05-18 ENCOUNTER — Encounter: Payer: Self-pay | Admitting: Physician Assistant

## 2022-05-20 ENCOUNTER — Other Ambulatory Visit: Payer: Self-pay

## 2022-05-20 DIAGNOSIS — I739 Peripheral vascular disease, unspecified: Secondary | ICD-10-CM

## 2022-05-20 DIAGNOSIS — I70243 Atherosclerosis of 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: Secondary | ICD-10-CM

## 2022-05-31 ENCOUNTER — Encounter: Payer: Self-pay | Admitting: Internal Medicine

## 2022-05-31 ENCOUNTER — Ambulatory Visit (INDEPENDENT_AMBULATORY_CARE_PROVIDER_SITE_OTHER): Payer: Medicare Other | Admitting: Internal Medicine

## 2022-05-31 VITALS — BP 138/78 | HR 65 | Ht 66.0 in | Wt 186.6 lb

## 2022-05-31 DIAGNOSIS — E1122 Type 2 diabetes mellitus with diabetic chronic kidney disease: Secondary | ICD-10-CM | POA: Diagnosis not present

## 2022-05-31 DIAGNOSIS — E669 Obesity, unspecified: Secondary | ICD-10-CM | POA: Diagnosis not present

## 2022-05-31 DIAGNOSIS — Z8639 Personal history of other endocrine, nutritional and metabolic disease: Secondary | ICD-10-CM | POA: Diagnosis not present

## 2022-05-31 DIAGNOSIS — Z794 Long term (current) use of insulin: Secondary | ICD-10-CM

## 2022-05-31 DIAGNOSIS — N184 Chronic kidney disease, stage 4 (severe): Secondary | ICD-10-CM | POA: Diagnosis not present

## 2022-05-31 DIAGNOSIS — I779 Disorder of arteries and arterioles, unspecified: Secondary | ICD-10-CM | POA: Diagnosis not present

## 2022-05-31 DIAGNOSIS — E1169 Type 2 diabetes mellitus with other specified complication: Secondary | ICD-10-CM

## 2022-05-31 DIAGNOSIS — E785 Hyperlipidemia, unspecified: Secondary | ICD-10-CM

## 2022-05-31 LAB — POCT GLYCOSYLATED HEMOGLOBIN (HGB A1C): Hemoglobin A1C: 7.6 % — AB (ref 4.0–5.6)

## 2022-05-31 LAB — TSH: TSH: 1.67 u[IU]/mL (ref 0.35–5.50)

## 2022-05-31 MED ORDER — NOVOLOG FLEXPEN 100 UNIT/ML ~~LOC~~ SOPN: 10.0000 [IU] | PEN_INJECTOR | Freq: Three times a day (TID) | SUBCUTANEOUS | 1 refills | Status: DC

## 2022-05-31 MED ORDER — DAPAGLIFLOZIN PROPANEDIOL 10 MG PO TABS: ORAL_TABLET | ORAL | 3 refills | Status: DC

## 2022-05-31 MED ORDER — TRESIBA FLEXTOUCH 200 UNIT/ML ~~LOC~~ SOPN: 40.0000 [IU] | PEN_INJECTOR | Freq: Every day | SUBCUTANEOUS | 3 refills | Status: DC

## 2022-05-31 MED ORDER — GLUCOSE BLOOD VI STRP
ORAL_STRIP | 3 refills | Status: DC
Start: 2022-05-31 — End: 2022-06-18

## 2022-05-31 NOTE — Patient Instructions (Addendum)
Please  continue: - Farxiga 10 mg daily before b'fast - Tresiba 40 units daily - Novolog: 10-14 units 2x day before meals 4-5 units for correction of a blood sugars >200   Please stop at the lab.  Please return in 4 months with your sugar log.

## 2022-05-31 NOTE — Progress Notes (Addendum)
Patient ID: [name], female   DOB: 09/09/1946, 76 y.o.   MRN: [id number]   HPI: [name] is a 76 y.o.-year-old female, initially referred by her cardiologist, Dr.Hochrein, presenting for follow-up for DM2, dx in ~2000, insulin-dependent since ~2005, uncontrolled, with complications (PVD - s/p stents and fem-pop bypass; h/o CVA x3; CKD stage 3b; DR; PN). She saw my colleague, Dr. Loanne Drilling many years ago.  Last visit with me 4 months ago. PCP: [name], PA  Interim history: No increased urination, blurry vision, nausea, diarrhea, constipation, chest pain.  She does have bilateral knee pain. Before last visit, she had an infected ischemic ulcer due to arterial insufficiency after injury to her left foot in 09/2021.  She had left femoropopliteal bypass graft 11/18/2021.  Since last visit she had a balloon angioplasty >> L blood flow improved >> she got back feeling in her toes.  Reviewed HbA1c levels: Lab Results  Component Value Date   HGBA1C 7.6 (A) 01/22/2022   HGBA1C 7.1 (H) 11/17/2021   HGBA1C 8.5 (H) 08/04/2021   HGBA1C 8.7 (A) 06/08/2021   HGBA1C 8.4 (H) 12/15/2020   HGBA1C 8.7 (A) 09/22/2020   HGBA1C 9.6 (A) 05/21/2020   HGBA1C 9.8 (A) 01/14/2020   HGBA1C 9.1 (A) 07/09/2019   HGBA1C 10.9 (H) 11/13/2018  01/26/2021: HbA1c calculated from fructosamine is 7.47%, higher. 09/22/2020: HbA1c calculated from fructosamine is much better than the directly measured HbA1c, at 6.25% 05/21/2020: HbA1c calculated from fructosamine 7.3%, much better than the directly measured one, and stable from before 01/14/2020: HbA1c calculated from fructosamine 7.3% 10/08/2019: HbA1c calculated from fructosamine: 6.9%  She was previously on: - Novolog 70/30 35 units before breakfast and 30 units before dinner- injecting hips and arms due to abdominal pain - Farxiga 5 mg in am - started 06/2019 >> 10 mg daily - increased 09/2019  Currently on: - Farxiga 10 mg daily before b'fast - Tresiba 40 units  daily - Novolog: 10-14 units 2x day before meals She could not tolerate Ozempic (started 11/2018) due to nausea, vomiting, abdominal pain-stopped 04/2019. She was on Invokana >> intolerance 2/2 weight loss and dehydration >> hospitalization. She was on Metformin and sulfonylurea at diagnosis. She was also on Januvia at the same time with Invokana >> stopped along with Invokana.  Pt was checking sugars 2-3x a day - am: 88, 102-210, 253, 283 >> 112-218 >> 121-140 >> 71, 86-114 >> 68, 72-121, 144 - 2h after b'fast: 326 >> n/c >> 326 (10/2019) >> n/c - before lunch: 265 >> 124-172 >> n/c - 2h after lunch: 280, 286 >> n/c >> 250 - before dinner: 73-193, 215 >> n/c >> 180 >> occasionally checking: <140 >> 76 - 2h after dinner: 247 >> 196- 201 >> 172 >> n/c  - bedtime: n/c >> 178 - nighttime: n/c >> 68  - seldom >> 57 (took Novolog and skipped dinner!!!), 129 Lowest sugar was 68 >> 71 >> 86 >> 57; she has hypoglycemia awareness at 80. Highest sugar was 335 (no medications) >> ... <200 >> 250 (missed meds).  Glucometer: Livongo >> One Touch Verio  Pt's meals are: - Breakfast: eggs or cheese half a sandwich - Lunch: half a sandwich, apple - Dinner 9-9:30 pm: chicken, veggies - Snacks: stopped cheese; now apples and pickles, bedtime snack: orange or apple  She works part-time, from 10 am - 2 pm Tue-Fri.   -+ CKD stage IIIb (Dr. Candiss Norse), last BUN/creatinine:  Lab Results  Component Value Date  BUN 42 (H) 03/09/2022   BUN 33 (H) 11/20/2021   CREATININE 2.20 (H) 03/09/2022   CREATININE 1.98 (H) 11/20/2021  12/2021: Cr 1.0, GFR 33  Lab Results  Component Value Date   GFRAA 32 (L) 12/15/2020   GFRAA 25 (L) 08/04/2020   GFRAA 23 (L) 07/31/2020   GFRAA 22 (L) 07/30/2020   GFRAA 31 (L) 10/18/2019  On olmesartan 40.  -+ HL; last set of lipids: Lab Results  Component Value Date   CHOL 141 01/22/2022   HDL 43.50 01/22/2022   LDLCALC 72 01/22/2022   LDLDIRECT 146.4 01/30/2010   TRIG  129.0 01/22/2022   CHOLHDL 3 01/22/2022  On Crestor 20-40 mg daily, omega-3 fatty acids 1200 mg daily.  - last eye exam was:09/2021: No DR; prev. + DR She also has glaucoma.  -+ Numbness and tingling in her feet.  She has a callus on the left foot.  She sees a podiatrist.  She did not tolerate Lyrica.  Last foot exam August 04, 2021.  On ASA 81.  Pt has FH of DM in sister.  She also has a history of nontoxic multinodular goiter.  Latest TSH was reviewed and this was normal: Lab Results  Component Value Date   TSH 1.60 08/04/2021   She also has a history of HTN, brain aneurysm, emphysema, esophageal stricture, GERD.  She had hernia repair surgery- had mesh placed >> persistent abdominal pain. She started to take a supplement and vitamins in 09/2021 ([facility] [place]) - sugars started to improve afterwards.  Her husband's nephew, [name], was also my patient and he died at the beginning of 22-Nov-2019 after he stopped going to dialysis.  ROS: + see HPI  I reviewed pt's medications, allergies, PMH, social hx, family hx, and changes were documented in the history of present illness. Otherwise, unchanged from my initial visit note.  Past Medical History:  Diagnosis Date   Abnormal findings on esophagogastroduodenoscopy (EGD) 07/2010   Aneurysm ([place]) Nov 21, 2001   in brain x 2,  "small"   Anxiety    Arthritis    Cataract    CKD (chronic kidney disease), stage IV ([place])    followed by [facility]   Colon polyps    Diabetes mellitus 11/21/1996   Diverticulosis    Emphysema of lung ([place])    "no one has evry told me that"   ESOPHAGEAL STRICTURE 08/27/2008   Family history of adverse reaction to anesthesia    daughter n/v   GERD (gastroesophageal reflux disease)    Hemorrhoids    Hiatal hernia    Hyperlipidemia    Hypertension 2000   Migraines    Neuropathy    fingers and toe   Peripheral vascular disease ([place])    Phlebitis    30 years ago  left leg    Stroke [facility])    multiple mini strokes ( brain aneurysm ).  Right side weaker.   Past Surgical History:  Procedure Laterality Date   ABDOMINAL AORTAGRAM N/A 01/04/2012   Procedure: ABDOMINAL AORTAGRAM;  Surgeon: Serafina Mitchell, MD;  Location: [facility] CATH LAB;  Service: Cardiovascular;  Laterality: N/A;   ABDOMINAL AORTAGRAM N/A 08/15/2012   Procedure: ABDOMINAL Maxcine Ham;  Surgeon: Serafina Mitchell, MD;  Location: [facility] CATH LAB;  Service: Cardiovascular;  Laterality: N/A;   ABDOMINAL AORTOGRAM W/LOWER EXTREMITY Left 11/17/2021   Procedure: ABDOMINAL AORTOGRAM W/LOWER EXTREMITY;  Surgeon: Serafina Mitchell, MD;  Location: [place] CV LAB;  Service: Cardiovascular;  Laterality: Left;   ABDOMINAL AORTOGRAM W/LOWER EXTREMITY N/A 03/09/2022   Procedure: ABDOMINAL AORTOGRAM W/LOWER EXTREMITY;  Surgeon: Serafina Mitchell, MD;  Location: [place] CV LAB;  Service: Cardiovascular;  Laterality: N/A;   ABDOMINAL HYSTERECTOMY  1984   cataract surgery Right 10-22-2015   cataract surgery Left 11-05-2015   CESAREAN SECTION     CHOLECYSTECTOMY     COLONOSCOPY  07/2010   DENTAL SURGERY  Aug. 16, 2013   left lower    ESOPHAGOGASTRODUODENOSCOPY  07/2010   EYE SURGERY  Nov. 2014   Laser-Glaucoma   FEMORAL ARTERY STENT  05/11/11   Left superficial femoral and popliteal artery   FEMORAL-POPLITEAL BYPASS GRAFT Left 11/18/2021   Procedure: LEFT FEMORAL-POPLITEAL ARTERY BYPASS GRAFT;  Surgeon: Serafina Mitchell, MD;  Location: [place];  Service: Vascular;  Laterality: Left;   HERNIA REPAIR     times two   KNEE SURGERY     LOWER EXTREMITY ANGIOGRAM Left 08/15/2012   Procedure: LOWER EXTREMITY ANGIOGRAM;  Surgeon: Serafina Mitchell, MD;  [facility];  Service: Cardiovascular;  Laterality: Left;  lt leg angio   PERIPHERAL VASCULAR BALLOON ANGIOPLASTY Left 03/09/2022   Procedure: PERIPHERAL VASCULAR BALLOON ANGIOPLASTY;  Surgeon: Serafina Mitchell, MD;  Location: [place] CV LAB;  Service: Cardiovascular;   Laterality: Left;   rotator cuff surgery     THYROID SURGERY     Social History   Socioeconomic History   Marital status: Married    Spouse name: Not on file   Number of children: 2   Years of education: Not on file   Highest education level: Not on file  Occupational History   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  Tobacco Use   Smoking status: Light Smoker    Packs/day: 0.40    Years: 40.00    Total pack years: 16.00    Types: Cigarettes   Smokeless tobacco: Never   Tobacco comments:    11/18/20 in process of quiting  Vaping Use   Vaping Use: Never used  Substance and Sexual Activity   Alcohol use: No    Alcohol/week: 0.0 standard drinks of alcohol   Drug use: Never   Sexual activity: Not on file  Other Topics Concern   Not on file  Social History Narrative   Lives with husband.        Social Determinants of Health   Financial Resource Strain: Not on file  Food Insecurity: Not on file  Transportation Needs: Not on file  Physical Activity: Not on file  Stress: Not on file  Social Connections: Not on file  Intimate Partner Violence: Not on file   Current Outpatient Medications on File Prior to Visit  Medication Sig Dispense Refill   amLODipine (NORVASC) 10 MG tablet Take 1 tablet (10 mg total) by mouth daily. for blood pressure 90 tablet 3   aspirin 325 MG EC tablet Take 325 mg by mouth at bedtime.     b complex vitamins capsule Take 2 capsules by mouth daily.     Blood Glucose Monitoring Suppl (ONETOUCH VERIO FLEX SYSTEM) w/Device KIT USE AS ADVISED 1 kit 0   chlorthalidone (HYGROTON) 25 MG tablet Take 25 mg by mouth every other day. At night     citalopram (CELEXA) 40 MG tablet Take 40 mg by mouth every morning.     Coenzyme Q10 (COQ10) 100 MG CAPS Take 100 mg by mouth daily.     dapagliflozin propanediol (FARXIGA) 10 MG TABS  tablet TAKE 1 TABLET BY MOUTH EVERY DAY BEFORE BREAKFAST. 90 tablet 1   Ginkgo Biloba 120 MG TABS Take 120 mg by mouth  daily.     glucose blood test strip Use as instructed 3x a day 200 each 12   insulin aspart (NOVOLOG FLEXPEN) 100 UNIT/ML FlexPen Inject 10-14 Units into the skin 3 (three) times daily before meals. (Patient taking differently: Inject 10-12 Units into the skin 2 (two) times daily before a meal. Sliding scale) 45 mL 1   insulin degludec (TRESIBA FLEXTOUCH) 200 UNIT/ML FlexTouch Pen Inject 40 Units into the skin daily. 27 mL 1   Insulin Pen Needle 29G X 5MM MISC Use with insulin pen 3 times a day. 300 each 11   meclizine (ANTIVERT) 25 MG tablet 1/2 tab up to 3 times daily for motion sickness/dizziness. (Patient taking differently: Take 12.5 mg by mouth 3 (three) times daily as needed for dizziness. 1/2 tab up to 3 times daily for motion sickness/dizziness.) 30 tablet 0   olmesartan (BENICAR) 40 MG tablet Take 1 tablet (40 mg total) by mouth daily. for blood pressure 90 tablet 3   Omega-3 Fatty Acids (FISH OIL) 1000 MG CAPS Take 1,000 mg by mouth every morning.     omeprazole (PRILOSEC) 20 MG capsule Take 1 capsule (20 mg total) by mouth 2 (two) times daily before a meal. 180 capsule 1   OneTouch Delica Lancets 81E MISC Use 3x a day 200 each 3   OVER THE COUNTER MEDICATION Take 2 tablets by mouth 2 (two) times daily before a meal. Gluco Revolve     OVER THE COUNTER MEDICATION Take 1 capsule by mouth daily. Brain Booster     Polyethyl Glycol-Propyl Glycol (SYSTANE ULTRA OP) Place 1 drop into both eyes daily at 12 noon. Additional drop if needed for dry eyes     rOPINIRole (REQUIP) 0.25 MG tablet TAKE 1 TABLET BY MOUTH AT BEDTIME. 90 tablet 2   rosuvastatin (CRESTOR) 40 MG tablet TAKE 1 /2 TO 1 TABLET DAILY FOR CHOLESTEROL 90 tablet 1   No current facility-administered medications on file prior to visit.   Allergies  Allergen Reactions   Ciprofloxacin Swelling   Ozempic (0.25 Or 0.5 Mg-Dose) [Semaglutide(0.25 Or 0.2m-Dos)] Nausea And Vomiting   Plavix [Clopidogrel Bisulfate] Palpitations    Amoxicillin Itching and Swelling    FACE & EYES SWELL   Ace Inhibitors Cough    Sore throat   Atorvastatin     myalgias   Lyrica [Pregabalin] Itching and Nausea Only     gain weight   Penicillins Other (See Comments)    Pt unsure if there is an allergic reaction    Family History  Problem Relation Age of Onset   CAD Mother 644      Died of MI   Hypertension Mother    Heart attack Mother    Heart disease Mother    CAD Father 641      Died of MI   Heart disease Father    Heart attack Father    CAD Brother 624      Two brothers died of MI   Heart attack Brother    Heart disease Brother        Amputation   Diabetes Sister    Hypertension Sister    Heart attack Brother    Heart disease Brother    Stroke Sister    Colon cancer Neg Hx    Stomach cancer Neg Hx  Esophageal cancer Neg Hx     PE: BP 138/78 (BP Location: Left Arm, Patient Position: Sitting, Cuff Size: Normal)   Pulse 65   Ht _0  (1.676 m)   Wt 186 lb 9.6 oz (84.6 kg)   SpO2 93%   BMI 30.12 kg/m  Wt Readings from Last 3 Encounters:  05/31/22 186 lb 9.6 oz (84.6 kg)  05/17/22 183 lb (83 kg)  04/15/22 184 lb 3.2 oz (83.6 kg)   Constitutional: overweight, in NAD Eyes: no exophthalmos ENT: moist mucous membranes, no masses palpated in neck, no cervical lymphadenopathy Cardiovascular: RRR, No MRG Respiratory: CTA B Musculoskeletal: no deformities Skin: moist, warm, no rashes Neurological: no tremor with outstretched hands Diabetic Foot Exam - Simple   Simple Foot Form Diabetic Foot exam was performed with the following findings: Yes 05/31/2022  9:53 AM  Visual Inspection No deformities, no ulcerations, no other skin breakdown bilaterally: Yes Sensation Testing Intact to touch and monofilament testing bilaterally: Yes Pulse Check Comments Slight pedal edema L foot. Decreased pulse in R foot. Small Callus plantar L foot.     ASSESSMENT: 1. DM2, insulin-dependent, uncontrolled, with  complications - PVD - s/p stents and fem-pop bypass - h/o CVA - CKD stage 3b - DR - PN  Her antipancreatic antibodies were negative: Component     Latest Ref Rng & Units 07/22/2016  Glutamic Acid Decarb Ab     <5 IU/mL <5   2.  Hyperlipidemia  3.  Obesity class 1  PLAN:  1. Patient with longstanding, uncontrolled, type 2 diabetes, previously on a premixed insulin regimen and now on the basal-bolus insulin regimen + SGLT2 inhibitor.  She is on [place] which she tolerates well.  She could not tolerate a GLP-1 receptor agonist (Ozempic) due to GI symptoms, and she refused to try Trulicity.  Also, she declined a V-Go patch pump.  HbA1c at last visit was higher, at 7.6%, increased from 7.1%.  At that time, she just started supplements from the [facility] in [place] and her sugars started to improve.  Sugars were at goal in the morning but she was not checking enough later in the day to understand trends.  I did advise her to start checking at different times of the day, but we did not change her regimen at that time.   -At today's visit, the majority of her blood sugars are at goal, however, she has a hyperglycemic spike later in the day when she forgot her medicines.  Also, she had 1 low blood sugar at night, at 57, when she took NovoLog for dinner but forgot to eat.  We discussed that this is very dangerous and she should always eat if she takes NovoLog.  Otherwise, with the sugars remaining at goal, I do not believe that we need to change her regimen. - I suggested to:  Patient Instructions  Please  continue: - Farxiga 10 mg daily before b'fast - Tresiba 40 units daily - Novolog: 10-14 units 2x day before meals 4-5 units for correction of a blood sugars >200   Please stop at the lab.  Please return in 4 months with your sugar log.   - we checked her HbA1c: 7.6% (stable) -higher than expected from her log.  We will check a fructosamine level. - advised to check sugars  at different times of the day - 4x a day, rotating check times - advised for yearly eye exams >> she is UTD - return to clinic in 4  months  2. HL -Latest lipid panel from last visit: LDL above our target of less than 55, HDL low: Lab Results  Component Value Date   CHOL 141 01/22/2022   HDL 43.50 01/22/2022   LDLCALC 72 01/22/2022   LDLDIRECT 146.4 01/30/2010   TRIG 129.0 01/22/2022   CHOLHDL 3 01/22/2022  -Previously on Lipitor 80 mg daily and now on Crestor 20-40 mg daily (per cardiology) and omega-3 fatty acids 1200 mg daily.  No side effects.    3.  Obesity class 1 -She continues on the SGLT2 inhibitor, Farxiga, 10 mg daily, which should also help with weight loss.  Unfortunately, we could not continue to use a GLP-1 receptor agonist due to intolerance. -Lost 8 pounds before the last visit, and gained 3 pounds since then.  Component     Latest Ref Rng 05/31/2022  TSH     0.35 - 5.50 uIU/mL 1.67   Fructosamine     205 - 285 umol/L 291 (H)     TSH is normal. HbA1c calculated from fructosamine is 6.5%, better than the directly measured HbA1c.  Philemon Kingdom, MD PhD [facility]

## 2022-06-02 LAB — FRUCTOSAMINE: Fructosamine: 291 umol/L — ABNORMAL HIGH (ref 205–285)

## 2022-06-07 DIAGNOSIS — N184 Chronic kidney disease, stage 4 (severe): Secondary | ICD-10-CM | POA: Diagnosis not present

## 2022-06-07 DIAGNOSIS — I129 Hypertensive chronic kidney disease with stage 1 through stage 4 chronic kidney disease, or unspecified chronic kidney disease: Secondary | ICD-10-CM | POA: Diagnosis not present

## 2022-06-07 DIAGNOSIS — E1122 Type 2 diabetes mellitus with diabetic chronic kidney disease: Secondary | ICD-10-CM | POA: Diagnosis not present

## 2022-06-07 DIAGNOSIS — R809 Proteinuria, unspecified: Secondary | ICD-10-CM | POA: Diagnosis not present

## 2022-06-07 DIAGNOSIS — D631 Anemia in chronic kidney disease: Secondary | ICD-10-CM | POA: Diagnosis not present

## 2022-06-07 DIAGNOSIS — N2581 Secondary hyperparathyroidism of renal origin: Secondary | ICD-10-CM | POA: Diagnosis not present

## 2022-06-10 ENCOUNTER — Encounter: Payer: Self-pay | Admitting: Internal Medicine

## 2022-06-10 DIAGNOSIS — E1122 Type 2 diabetes mellitus with diabetic chronic kidney disease: Secondary | ICD-10-CM

## 2022-06-18 MED ORDER — GLUCOSE BLOOD VI STRP: ORAL_STRIP | 3 refills | Status: DC

## 2022-06-28 ENCOUNTER — Telehealth: Payer: Self-pay

## 2022-06-28 NOTE — Patient Outreach (Signed)
  Care Coordination   06/28/2022 Name: [name] MRN: [id number] DOB: 06-30-1946   Care Coordination Outreach Attempts:  An unsuccessful telephone outreach was attempted today to offer the patient information about available care coordination services as a benefit of their health plan.   Follow Up Plan:  Additional outreach attempts will be made to offer the patient care coordination information and services.   Encounter Outcome:  No Answer  Care Coordination Interventions Activated:  No   Care Coordination Interventions:  No, not indicated    Jone Baseman, RN, MSN [facility] Care Management Care Management Coordinator Direct Line [phone]

## 2022-07-09 ENCOUNTER — Telehealth: Payer: Self-pay

## 2022-07-09 NOTE — Patient Instructions (Signed)
Visit Information  Thank you for taking time to visit with me today. Please don't hesitate to contact me if I can be of assistance to you.   Following are the goals we discussed today:   Goals Addressed             This Visit's Progress    COMPLETED: Care Coordination Activities-No follow up required       Care Coordination Interventions: Advised patient to schedule annual wellness visit. Patient states she has a visit coming up soon.           If you are experiencing a Mental Health or [place] or need someone to talk to, please call the Suicide and Crisis Lifeline: 988   Patient verbalizes understanding of instructions and care plan provided today and agrees to view in [facility]. Active MyChart status and patient understanding of how to access instructions and care plan via MyChart confirmed with patient.     No further follow up required: decline  Jone Baseman, RN, MSN [place] Management Care Management Coordinator Direct Line [phone]

## 2022-07-09 NOTE — Patient Outreach (Signed)
  Care Coordination   Initial Visit Note   07/09/2022 Name: [name] MRN: [id number] DOB: 25-Aug-1946  [name] is a 77 y.o. year old female who sees [name], MD for primary care. I spoke with  [name] by phone today.  She states she is about to go into work. Discussed annual wellness visit.   What matters to the patients health and wellness today?  none    Goals Addressed             This Visit's Progress    COMPLETED: Care Coordination Activities-No follow up required       Care Coordination Interventions: Advised patient to schedule annual wellness visit. Patient states she has a visit coming up soon.          SDOH assessments and interventions completed:  Yes     Care Coordination Interventions Activated:  Yes  Care Coordination Interventions:  Yes, provided   Follow up plan: No further intervention required.   Encounter Outcome:  Pt. Visit Completed   Jone Baseman, RN, MSN [place] Management Care Management Coordinator Direct Line [phone]

## 2022-07-13 ENCOUNTER — Other Ambulatory Visit: Payer: Self-pay | Admitting: Nurse Practitioner

## 2022-07-13 MED ORDER — CITALOPRAM HYDROBROMIDE 40 MG PO TABS: 40.0000 mg | ORAL_TABLET | ORAL | 3 refills | Status: DC

## 2022-07-15 ENCOUNTER — Encounter: Payer: Self-pay | Admitting: Internal Medicine

## 2022-07-17 DIAGNOSIS — Z23 Encounter for immunization: Secondary | ICD-10-CM | POA: Diagnosis not present

## 2022-07-19 ENCOUNTER — Encounter: Payer: Self-pay | Admitting: Internal Medicine

## 2022-07-19 DIAGNOSIS — E1122 Type 2 diabetes mellitus with diabetic chronic kidney disease: Secondary | ICD-10-CM

## 2022-07-20 MED ORDER — INSULIN DEGLUDEC 100 UNIT/ML ~~LOC~~ SOPN: 40.0000 [IU] | PEN_INJECTOR | Freq: Every day | SUBCUTANEOUS | 1 refills | Status: DC

## 2022-08-02 DIAGNOSIS — N184 Chronic kidney disease, stage 4 (severe): Secondary | ICD-10-CM | POA: Diagnosis not present

## 2022-08-03 NOTE — Progress Notes (Unsigned)
COMPLETE PHYSICAL   Assessment:    [name] was seen today for annual exam.  Diagnoses and all orders for this visit:  Encounter for routine adult medical exam with abnormal findings Yearly  Essential hypertension Continue current medications: amlodpine 65m,  olmesartan 457mdaily. Monitor blood pressure at home; call if consistently over 130/80 Continue DASH diet.   Reminder to go to the ER if any CP, SOB, nausea, dizziness, severe HA, changes vision/speech, left arm numbness and tingling and jaw pain. -     CBC with Differential/Platelet -     COMPLETE METABOLIC PANEL WITH GFR -     TSH -     Magnesium -     EKG 12-Lead  Hyperlipidemia associated with type 2 diabetes mellitus (HCC) Continue medications:rosuvastatin 1055miscussed dietary and exercise modifications Low fat diet -     Lipid panel  GERD Stop Omeprazole and begin pantoprazole Sleep with head on 2 pillows Continue to avoid trigger foods and eating too close to bedtime  Anxiety state Continue Citalopram Add Buspirone 5 mg TID as needed If symptoms are not controlled notify the office  Peripheral vascular disease due to secondary diabetes mellitus (HCCAuroraontrol blood pressure, lipids and glucose Disscused lifestyle modifications, diet & exercise Continue to monitor  PAOD (peripheral arterial occlusive disease) (HCCAirportontinue to follow with vascular surgery  Vision change Schedule appointment with ophthalmology  Type 2 diabetes mellitus with stage 4 chronic kidney disease, with long-term current use of insulin (HCCWoodlawnontinue medications follow s with Endocrinology Discussed general issues about diabetes pathophysiology and management. Education: Reviewed 'ABCs' of diabetes management (respective goals in parentheses):  A1C (<7), blood pressure (<130/80), and cholesterol (LDL <70) Dietary recommendations Encouraged aerobic exercise.  Discussed foot care, check daily Yearly retinal exam Dental exam  every 6 months Monitor blood glucose, discussed goal for patient   History of CVA (cerebrovascular accident) Control blood pressure, lipids and glucose Disscused lifestyle modifications, diet & exercise Continue to monitor  Vitamin D deficiency Continue Vit D supplementation to maintain value in therapeutic level of 60-100 -     VITAMIN D 25 Hydroxy (Vit-D Deficiency, Fractures)  CKD stage 4 due to type 2 diabetes mellitus (HCC) Increase fluids, have discussed this at length with patient Avoid NSAIDS Blood pressure control Monitor sugars, needs better glucose control Continue to follow with  [place] related to continually decline in kidney function -     Microalbumin / creatinine urine ratio -     Urinalysis, Routine w reflex microscopic  Atherosclerosis of aorta ([place]CC[place]lacha [place]les[place] 06/19/20 Control blood pressure, lipids and glucose Disscused lifestyle modifications, diet & exercise Continue to monitor  Depression, major, recurrent, in partial remission (HCC) Discussed stress management techniques  Discussed, increase water,intake & good sleep hygiene  Discussed increasing exercise & vegetables in diet  Neuropathy due to secondary diabetes (HCC) Control glucose  Medication management -Continued  Pulmonary emphysema, unspecified emphysema type (HCC)/ Nicotine dependence - CT lung cancer screen ordered Strongly encouraged to stop smoking Currently not on inhalers and symptoms are currently controlled  History of CVA (cerebrovascular accident) Control blood pressure, cholesterol, glucose, increase exercise.   Nontoxic multinodular goiter Monitor  Restless leg syndrome Dr. LedWindy Kalataarted Clonazepam many years ago. Clonazepam was stopped and symptoms have worsened Ropinirole 0.76m47mnight  Insomnia Tried Trazodone but was not tolerated.  Obesity with Type 2 DM(HCC) Long discussion about weight loss, diet, and exercise Recommended diet heavy in fruits and  veggies and low in animal  meats, cheeses, and dairy products, appropriate calorie intake Patient will work on decreasing saturated fats, simple carbs and increasing activity Follow up at next visit  Eustachian tube dysfunction left Mucinex daily x 7-10 days Generic Allegra or Zyrtec daily for several weeks until leaves are done falling    Future Appointments  Date Time Provider [place]  08/09/2022  1:00 PM [facility] 5 [facility]  08/09/2022  1:30 PM [facility] 5 [facility]  08/09/2022  2:00 PM [facility] PA [facility]  09/27/2022 10:40 AM Philemon Kingdom, MD [facility] None  08/08/2023  2:00 PM Karen Rossetti, NP [facility] None  '    Subjective:   [name] is a 76 y.o. female who presents for Medicare Annual Wellness Visit and 3 month follow up on hypertension, diabetes, hyperlipidemia, vitamin D def.   She some swelling in her right eye- has left message with ophthalmologist. She has had irritation/ blurred vision in the eye x several weeks. She is trying to get in sooner.  On 11/17/21 abdominal aortagram with lower extremity. On 11/18/21 left femoral popliteal artery bypass graft. She had abdominal aortagram with lower extremity peripheral vascular balloon angioplasty on 03/09/22. She goes in 5 days for reevaluation of left leg.  Wears support hose daily. She does have some numbness and aching in left lower leg.  Her blood pressure has been controlled at home(120's/70's) on Amlodipine 10 mg and Olmesartan 40 mg QD, today their BP is BP: (!) 140/60 . BP Readings from Last 3 Encounters:  08/04/22 (!) 140/60  05/31/22 138/78  05/17/22 (!) 141/65  Denies chest pain, shortness of breath and dizziness  Sees Dr. Leta Baptist yearly for previous CVA x 2, as well as several TIA. Will have an occasional migraine, will lose vision and have nausea. Will use 2 excedrin or Ibuprofen.  She does have some heartburn symptoms, worse when laying down at night.  Is currently  on Omeprazole but is unsure if it is controlled.  She has COPD, has lung nodules, last one was CT chest (09/2020) agrees to repeat. Continues to smoke 1/2 ppd. Does have occasional shortness of breath with exertion.  -pulmonologist retired She has albuterol as needed. Needs to schedule appointment for follow up.   She is having a lot more anxiety and does not feel her symptoms are ot controlled with Citalopram.   She follows with Dr. Candiss Norse for CKD stage 4. Kidney functions have stayed stable.   BMI is Body mass index is 30.44 kg/m., she has been working on diet and exercise.Exercise is difficult due to PAD and neuropathy Wt Readings from Last 3 Encounters:  08/04/22 188 lb 9.6 oz (85.5 kg)  05/31/22 186 lb 9.6 oz (84.6 kg)  05/17/22 183 lb (83 kg)    She has several comorbidities from her DM including Neuropathy Retinopathy Dr. Kathlen Mody 04/2021- follow up 6 months Hyperlipidemia at goal less than 70, on crestor 40 mg CKD she is on ARB (off metformin due to CKDq)  history of TIA she is on 325 ASA PAD s/p stents , Rosuvastatin 40 mg QD for cholesterol and is controlled  Novolog 70/30 mix on 10-14 units before each meal BS running 80-104 Tresiba 40 units QAM She is on farxiga and tolerating it well she been working on diet and exercise for diabetes and following with Dr. Darnell Level denies polydipsia, polyuria and visual disturbances.  Last A1C in the office was:  Lab Results  Component Value Date   HGBA1C  7.6 (A) 05/31/2022    Lab Results  Component Value Date   CHOL 141 01/22/2022   HDL 43.50 01/22/2022   LDLCALC 72 01/22/2022   LDLDIRECT 146.4 01/30/2010   TRIG 129.0 01/22/2022   CHOLHDL 3 01/22/2022   Lab Results  Component Value Date   EGFR 29 (L) 11/10/2021     She is drinking lots of water, follows with nephrology. Patient is on Vitamin D. Lab Results  Component Value Date   VD25OH 21 (L) 08/04/2020     Names of Other Physician/Practitioners you currently use: 1.  [facility]- here for primary care 2. Dr. Herbert Deaner, DIABETIC eye doctor, last visit 08/2020 Retinopathy Patient Care Team: [name], MD as PCP - General (Internal Medicine) Minus Breeding, MD as PCP - Cardiology (Cardiology) Michel Santee, MD as Consulting Physician (Neurology) Serafina Mitchell, MD as Consulting Physician (Vascular Surgery) Irene Shipper, MD as Consulting Physician (Gastroenterology) Gardiner Barefoot, DPM as Consulting Physician (Podiatry) Hortencia Pilar, MD as Consulting Physician (Surgery) Grace Isaac, MD (Inactive) as Consulting Physician (Cardiothoracic Surgery) Rush Landmark, [facility] (Inactive) as Pharmacist (Pharmacist)  Medication Review  Current Outpatient Medications (Endocrine & Metabolic):    dapagliflozin propanediol (FARXIGA) 10 MG TABS tablet, TAKE 1 TABLET BY MOUTH EVERY DAY BEFORE BREAKFAST.   insulin aspart (NOVOLOG FLEXPEN) 100 UNIT/ML FlexPen, Inject 10-14 Units into the skin 3 (three) times daily before meals.   insulin degludec (TRESIBA) 100 UNIT/ML FlexTouch Pen, Inject 40 Units into the skin daily.  Current Outpatient Medications (Cardiovascular):    amLODipine (NORVASC) 10 MG tablet, Take 1 tablet (10 mg total) by mouth daily. for blood pressure   olmesartan (BENICAR) 40 MG tablet, Take 1 tablet (40 mg total) by mouth daily. for blood pressure   rosuvastatin (CRESTOR) 40 MG tablet, TAKE 1 /2 TO 1 TABLET DAILY FOR CHOLESTEROL   chlorthalidone (HYGROTON) 25 MG tablet, Take 25 mg by mouth every other day. At night (Patient not taking: Reported on 08/04/2022)   Current Outpatient Medications (Analgesics):    Acetaminophen (TYLENOL PO), Take by mouth.   aspirin 325 MG EC tablet, Take 325 mg by mouth at bedtime.   Current Outpatient Medications (Other):    b complex vitamins capsule, Take 2 capsules by mouth daily.   Blood Glucose Monitoring Suppl (ONETOUCH VERIO FLEX SYSTEM) w/Device KIT, USE AS  ADVISED   citalopram (CELEXA) 40 MG tablet, Take 1 tablet (40 mg total) by mouth every morning.   Coenzyme Q10 (COQ10) 100 MG CAPS, Take 100 mg by mouth daily.   Ginkgo Biloba 120 MG TABS, Take 120 mg by mouth daily.   glucose blood test strip, Use as instructed 3x a day - One Touch Verio   meclizine (ANTIVERT) 25 MG tablet, 1/2 tab up to 3 times daily for motion sickness/dizziness. (Patient taking differently: Take 12.5 mg by mouth 3 (three) times daily as needed for dizziness. 1/2 tab up to 3 times daily for motion sickness/dizziness.)   Omega-3 Fatty Acids (FISH OIL) 1000 MG CAPS, Take 1,000 mg by mouth every morning.   omeprazole (PRILOSEC) 20 MG capsule, Take 1 capsule (20 mg total) by mouth 2 (two) times daily before a meal.   OneTouch Delica Lancets 57D MISC, Use 3x a day   Polyethyl Glycol-Propyl Glycol (SYSTANE ULTRA OP), Place 1 drop into both eyes daily at 12 noon. Additional drop if needed for dry eyes   rOPINIRole (REQUIP) 0.25 MG tablet, TAKE 1 TABLET BY MOUTH AT BEDTIME.  OVER THE COUNTER MEDICATION, Take 2 tablets by mouth 2 (two) times daily before a meal. Gluco Revolve (Patient not taking: Reported on 08/04/2022)   OVER THE COUNTER MEDICATION, Take 1 capsule by mouth daily. 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 ([place]) 12/11/2021   Pain in right knee 01/07/2020   Carotid artery stenosis 12/03/2019   Migraine with aura and without status migrainosus, not intractable 11/28/2019   Hyperlipidemia associated with type 2 diabetes mellitus ([place]) 11/10/2019   Coronary artery calcification 08/05/2019   PAOD (peripheral arterial occlusive disease) ([place]) 02/26/2019   SOB (shortness of breath) 11/17/2018   Emphysema of lung ([place]) 10/10/2017   Atherosclerosis of  aorta ([place]) 10/10/2017   Lung nodule 10/10/2017   Anxiety 08/22/2017   Arthritis 08/22/2017   Neuropathy due to secondary diabetes ([place]) 08/22/2017   Type 2 diabetes mellitus with circulatory disorder, with long-term current use of insulin ([place]) 11/07/2015   Encounter for Medicare annual wellness exam 06/24/2015   Generalized anxiety disorder 03/26/2015   Depression, major, recurrent, in partial remission ([place]) 03/26/2015   History of CVA (cerebrovascular accident) 10/04/2014   Tobacco abuse 10/04/2014   Vitamin D deficiency 11/02/2013   Medication management 11/02/2013   Peripheral vascular disease due to secondary diabetes mellitus ([place]) 05/08/2012   Atherosclerosis of native arteries of extremity with intermittent claudication 12/24/2011   Diverticulosis of large intestine 06/09/2010   History of colonic polyps 06/09/2010   Esophageal reflux 08/27/2008   Nontoxic multinodular goiter 03/11/2008   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[place]) 03/11/2008   Brain aneurysm 03/11/2008   CKD stage 4 due to type 2 diabetes mellitus ([place]) 05/01/2007   Essential hypertension 05/01/2007    Screening Tests Immunization History  Administered Date(s) Administered   DT (Pediatric) 03/26/2015   Fluad Quad(high Dose 65+) 07/09/2019, 07/17/2022   Influenza Whole 08/02/2007, 06/18/2009, 07/09/2013   Influenza, High Dose Seasonal PF 08/05/2015, 07/22/2016, 07/04/2017, 06/30/2020   Influenza-Unspecified 07/17/2021   Moderna Covid-19 Vaccine Bivalent Booster 49yr & up 07/17/2022   PFIZER Comirnaty(Gray Top)Covid-19 Tri-Sucrose Vaccine 10/13/2019, 11/03/2019   PFIZER(Purple Top)SARS-COV-2 Vaccination 06/30/2020   Pfizer Covid-19 Vaccine Bivalent Booster 180yr& up 06/17/2021   Pneumococcal Conjugate-13 07/22/2016   Pneumococcal Polysaccharide-23 02/08/2014   Td 09/21/2004   Tdap 10/06/2021   Unspecified SARS-COV-2 Vaccination 10/13/2019, 11/03/2019  Prior vaccinations: TD or Tdap: 2016  Influenza:  20203 Pneumococcal: 2015 Prevnar 13:2017 Shingles/Zostavax: Discussed with patient  Allergies Allergies  Allergen Reactions   Ciprofloxacin Swelling   Ozempic (0.25 Or 0.5 Mg-Dose) [Semaglutide(0.25 Or 0.23m423mos)] Nausea And Vomiting   Plavix [Clopidogrel Bisulfate] Palpitations   Amoxicillin Itching and Swelling    FACE & EYES SWELL   Ace Inhibitors Cough    Sore throat   Atorvastatin     myalgias   Lyrica [Pregabalin] Itching and Nausea Only     gain weight   Penicillins Other (See Comments)    Pt unsure if there is an allergic reaction     SURGICAL HISTORY She  has a past surgical history that includes Cesarean section; Cholecystectomy; Knee surgery; rotator cuff surgery; Thyroid surgery; Hernia repair; Abdominal hysterectomy (1984); Femoral artery stent (05/11/11); Dental surgery (Aug. 16, 2013); Eye surgery (Nov. 2014); Esophagogastroduodenoscopy (07/2010); Colonoscopy (07/2010); abdominal aortagram (N/A, 01/04/2012); abdominal aortagram (N/A, 08/15/2012); lower extremity angiogram (Left, 08/15/2012); cataract  surgery (Right, 10-22-2015); cataract surgery (Left, 11-05-2015); ABDOMINAL AORTOGRAM W/LOWER EXTREMITY (Left, 11/17/2021); Femoral-popliteal Bypass Graft (Left, 11/18/2021); ABDOMINAL AORTOGRAM W/LOWER EXTREMITY (N/A, 03/09/2022); and PERIPHERAL VASCULAR BALLOON ANGIOPLASTY (Left, 03/09/2022). FAMILY HISTORY Her family history includes CAD (age of onset: 87) in her brother; CAD (age of onset: 41) in her father and mother; Diabetes in her sister; Heart attack in her brother, brother, father, and mother; Heart disease in her brother, brother, father, and mother; Hypertension in her mother and sister; Stroke in her sister. SOCIAL HISTORY She  reports that she has been smoking cigarettes. She has a 16.00 pack-year smoking history. She has never used smokeless tobacco. She reports that she does not drink alcohol and does not use drugs.  Review of Systems  Constitutional:  Positive for  malaise/fatigue. Negative for weight loss.  HENT:  Positive for congestion. Negative for hearing loss, sinus pain and tinnitus.   Eyes:  Negative for blurred vision.  Respiratory:  Positive for wheezing. Negative for cough, sputum production and shortness of breath.   Cardiovascular:  Positive for claudication and PND. Negative for chest pain, palpitations and leg swelling.  Gastrointestinal:  Negative for abdominal pain, constipation, diarrhea, heartburn, nausea and vomiting.  Genitourinary:  Negative for dysuria.  Musculoskeletal:  Positive for joint pain.  Skin:        Raised area on left buttock  Neurological:  Positive for sensory change, weakness (R>L) and headaches. Negative for dizziness.       RLS  Endo/Heme/Allergies:  Does not bruise/bleed easily.  Psychiatric/Behavioral:  Positive for memory loss. Negative for depression.     Objective:   Blood pressure (!) 140/60, pulse 63, temperature 97.7 F (36.5 C), height _0  (1.676 m), weight 188 lb 9.6 oz (85.5 kg), SpO2 94 %. Body mass index is 30.44 kg/m.  General appearance: alert, no distress, WD/WN,  female HEENT: normocephalic, sclerae anicteric, TMs pearly, nares patent, no discharge or erythema, pharynx normal. Oral cavity: MMM, no lesions Neck: supple, no lymphadenopathy, no thyromegaly, no masses Heart: RRR, normal S1, S2, no murmurs Lungs: CTA bilaterally, no wheezes, rhonchi, or rales Abdomen: +bs, soft, non tender, non distended, no masses, no hepatomegaly, no splenomegaly Skin: 2 cm seborrheic keratosis on left buttock Musculoskeletal: nontender, no swelling, no obvious deformity Extremities: no edema, no cyanosis, no clubbing Pulses:DP and PT diminished on left, normal on right Neurological: alert, oriented x 3, CN2-12 intact, strength diminished right foot and hand,  Diminished monofilament on all toes but great toe bilaterally.DTRs 2+ throughout, no cerebellar signs, gait normal Psychiatric: normal affect,  behavior normal, pleasant  Breast: defer Gyn: defer Rectal: defer QMG:QQPYP bradycardia , no ST changes AAA: < 3 cm   Karen Rossetti, NP   08/04/2022

## 2022-08-04 ENCOUNTER — Encounter: Payer: Self-pay | Admitting: Nurse Practitioner

## 2022-08-04 ENCOUNTER — Ambulatory Visit (INDEPENDENT_AMBULATORY_CARE_PROVIDER_SITE_OTHER): Payer: Medicare Other | Admitting: Nurse Practitioner

## 2022-08-04 VITALS — BP 140/60 | HR 63 | Temp 97.7°F | Ht 66.0 in | Wt 188.6 lb

## 2022-08-04 DIAGNOSIS — E1169 Type 2 diabetes mellitus with other specified complication: Secondary | ICD-10-CM | POA: Diagnosis not present

## 2022-08-04 DIAGNOSIS — H6992 Unspecified Eustachian tube disorder, left ear: Secondary | ICD-10-CM

## 2022-08-04 DIAGNOSIS — H539 Unspecified visual disturbance: Secondary | ICD-10-CM

## 2022-08-04 DIAGNOSIS — N184 Chronic kidney disease, stage 4 (severe): Secondary | ICD-10-CM | POA: Diagnosis not present

## 2022-08-04 DIAGNOSIS — F3341 Major depressive disorder, recurrent, in partial remission: Secondary | ICD-10-CM

## 2022-08-04 DIAGNOSIS — I779 Disorder of arteries and arterioles, unspecified: Secondary | ICD-10-CM

## 2022-08-04 DIAGNOSIS — I700 Atherosclerosis of aorta: Secondary | ICD-10-CM | POA: Diagnosis not present

## 2022-08-04 DIAGNOSIS — Z79899 Other long term (current) drug therapy: Secondary | ICD-10-CM

## 2022-08-04 DIAGNOSIS — Z136 Encounter for screening for cardiovascular disorders: Secondary | ICD-10-CM

## 2022-08-04 DIAGNOSIS — F419 Anxiety disorder, unspecified: Secondary | ICD-10-CM

## 2022-08-04 DIAGNOSIS — E042 Nontoxic multinodular goiter: Secondary | ICD-10-CM | POA: Diagnosis not present

## 2022-08-04 DIAGNOSIS — F17200 Nicotine dependence, unspecified, uncomplicated: Secondary | ICD-10-CM

## 2022-08-04 DIAGNOSIS — G2581 Restless legs syndrome: Secondary | ICD-10-CM

## 2022-08-04 DIAGNOSIS — E1122 Type 2 diabetes mellitus with diabetic chronic kidney disease: Secondary | ICD-10-CM

## 2022-08-04 DIAGNOSIS — E559 Vitamin D deficiency, unspecified: Secondary | ICD-10-CM

## 2022-08-04 DIAGNOSIS — I10 Essential (primary) hypertension: Secondary | ICD-10-CM

## 2022-08-04 DIAGNOSIS — L821 Other seborrheic keratosis: Secondary | ICD-10-CM

## 2022-08-04 DIAGNOSIS — E1340 Other specified diabetes mellitus with diabetic neuropathy, unspecified: Secondary | ICD-10-CM

## 2022-08-04 DIAGNOSIS — Z0001 Encounter for general adult medical examination with abnormal findings: Secondary | ICD-10-CM

## 2022-08-04 DIAGNOSIS — K219 Gastro-esophageal reflux disease without esophagitis: Secondary | ICD-10-CM

## 2022-08-04 DIAGNOSIS — G4700 Insomnia, unspecified: Secondary | ICD-10-CM

## 2022-08-04 DIAGNOSIS — Z8673 Personal history of transient ischemic attack (TIA), and cerebral infarction without residual deficits: Secondary | ICD-10-CM

## 2022-08-04 DIAGNOSIS — Z87891 Personal history of nicotine dependence: Secondary | ICD-10-CM

## 2022-08-04 DIAGNOSIS — J439 Emphysema, unspecified: Secondary | ICD-10-CM

## 2022-08-04 DIAGNOSIS — E785 Hyperlipidemia, unspecified: Secondary | ICD-10-CM | POA: Diagnosis not present

## 2022-08-04 MED ORDER — BUSPIRONE HCL 5 MG PO TABS: 5.0000 mg | ORAL_TABLET | Freq: Three times a day (TID) | ORAL | 2 refills | Status: DC

## 2022-08-04 MED ORDER — PANTOPRAZOLE SODIUM 40 MG PO TBEC: 40.0000 mg | DELAYED_RELEASE_TABLET | Freq: Every day | ORAL | 1 refills | Status: DC

## 2022-08-04 NOTE — Patient Instructions (Addendum)
Stop Omepazole and begin pantoprazole daily for reflux symptoms  Continue Citalopram and add Buspirone 5 mg three times a day as needed for anxiety  Take plain Mucinex daily for 7-10 days for fluid in ears. Would also be good to add generic Zyrtec or Allegra from over the counter for allergies  Eustachian Tube Dysfunction  Eustachian tube dysfunction refers to a condition in which a blockage develops in the narrow passage that connects the middle ear to the back of the nose (eustachian tube). The eustachian tube regulates air pressure in the middle ear by letting air move between the ear and nose. It also helps to drain fluid from the middle ear space. Eustachian tube dysfunction can affect one or both ears. When the eustachian tube does not function properly, air pressure, fluid, or both can build up in the middle ear. What are the causes? This condition occurs when the eustachian tube becomes blocked or cannot open normally. Common causes of this condition include: Ear infections. Colds and other infections that affect the nose, mouth, and throat (upper respiratory tract). Allergies. Irritation from cigarette smoke. Irritation from stomach acid coming up into the esophagus (gastroesophageal reflux). The esophagus is the part of the body that moves food from the mouth to the stomach. Sudden changes in air pressure, such as from descending in an airplane or scuba diving. Abnormal growths in the nose or throat, such as: Growths that line the nose (nasal polyps). Abnormal growth of cells (tumors). Enlarged tissue at the back of the throat (adenoids). What increases the risk? You are more likely to develop this condition if: You smoke. You are overweight. You are a child who has: Certain birth defects of the mouth, such as cleft palate. Large tonsils or adenoids. What are the signs or symptoms? Common symptoms of this condition include: A feeling of fullness in the ear. Ear  pain. Clicking or popping noises in the ear. Ringing in the ear (tinnitus). Hearing loss. Loss of balance. Dizziness. Symptoms may get worse when the air pressure around you changes, such as when you travel to an area of high elevation, fly on an airplane, or go scuba diving. How is this diagnosed? This condition may be diagnosed based on: Your symptoms. A physical exam of your ears, nose, and throat. Tests, such as those that measure: The movement of your eardrum. Your hearing (audiometry). How is this treated? Treatment depends on the cause and severity of your condition. In mild cases, you may relieve your symptoms by moving air into your ears. This is called "popping the ears." In more severe cases, or if you have symptoms of fluid in your ears, treatment may include: Medicines to relieve congestion (decongestants). Medicines that treat allergies (antihistamines). Nasal sprays or ear drops that contain medicines that reduce swelling (steroids). A procedure to drain the fluid in your eardrum. In this procedure, a small tube may be placed in the eardrum to: Drain the fluid. Restore the air in the middle ear space. A procedure to insert a balloon device through the nose to inflate the opening of the eustachian tube (balloon dilation). Follow these instructions at home: Lifestyle Do not do any of the following until your health care provider approves: Travel to high altitudes. Fly in airplanes. Work in a Pension scheme manager or room. Scuba dive. Do not use any products that contain nicotine or tobacco. These products include cigarettes, chewing tobacco, and vaping devices, such as e-cigarettes. If you need help quitting, ask your health care provider.  Keep your ears dry. Wear fitted earplugs during showering and bathing. Dry your ears completely after. General instructions Take over-the-counter and prescription medicines only as told by your health care provider. Use techniques to  help pop your ears as recommended by your health care provider. These may include: Chewing gum. Yawning. Frequent, forceful swallowing. Closing your mouth, holding your nose closed, and gently blowing as if you are trying to blow air out of your nose. Keep all follow-up visits. This is important. Contact a health care provider if: Your symptoms do not go away after treatment. Your symptoms come back after treatment. You are unable to pop your ears. You have: A fever. Pain in your ear. Pain in your head or neck. Fluid draining from your ear. Your hearing suddenly changes. You become very dizzy. You lose your balance. Get help right away if: You have a sudden, severe increase in any of your symptoms. Summary Eustachian tube dysfunction refers to a condition in which a blockage develops in the eustachian tube. It can be caused by ear infections, allergies, inhaled irritants, or abnormal growths in the nose or throat. Symptoms may include ear pain or fullness, hearing loss, or ringing in the ears. Mild cases are treated with techniques to unblock the ears, such as yawning or chewing gum. More severe cases are treated with medicines or procedures. This information is not intended to replace advice given to you by your health care provider. Make sure you discuss any questions you have with your health care provider. Document Revised: 11/17/2020 Document Reviewed: 11/17/2020 Elsevier Patient Education  [place].

## 2022-08-05 ENCOUNTER — Other Ambulatory Visit: Payer: Self-pay | Admitting: Nurse Practitioner

## 2022-08-05 LAB — COMPLETE METABOLIC PANEL WITH GFR
AG Ratio: 1.2 (calc) (ref 1.0–2.5)
ALT: 13 U/L (ref 6–29)
AST: 23 U/L (ref 10–35)
Albumin: 3.5 g/dL — ABNORMAL LOW (ref 3.6–5.1)
Alkaline phosphatase (APISO): 104 U/L (ref 37–153)
BUN/Creatinine Ratio: 12 (calc) (ref 6–22)
BUN: 31 mg/dL — ABNORMAL HIGH (ref 7–25)
CO2: 30 mmol/L (ref 20–32)
Calcium: 8.9 mg/dL (ref 8.6–10.4)
Chloride: 106 mmol/L (ref 98–110)
Creat: 2.52 mg/dL — ABNORMAL HIGH (ref 0.60–1.00)
Globulin: 3 g/dL (calc) (ref 1.9–3.7)
Glucose, Bld: 102 mg/dL — ABNORMAL HIGH (ref 65–99)
Potassium: 4.6 mmol/L (ref 3.5–5.3)
Sodium: 142 mmol/L (ref 135–146)
Total Bilirubin: 0.4 mg/dL (ref 0.2–1.2)
Total Protein: 6.5 g/dL (ref 6.1–8.1)
eGFR: 19 mL/min/{1.73_m2} — ABNORMAL LOW (ref >=60)

## 2022-08-05 LAB — URINALYSIS, ROUTINE W REFLEX MICROSCOPIC
Bacteria, UA: NONE SEEN /HPF
Bilirubin Urine: NEGATIVE
Ketones, ur: NEGATIVE
Leukocytes,Ua: NEGATIVE
Nitrite: NEGATIVE
RBC / HPF: NONE SEEN /HPF (ref 0–2)
Specific Gravity, Urine: 1.038 — ABNORMAL HIGH (ref 1.001–1.035)
pH: <=5 (ref 5.0–8.0)

## 2022-08-05 LAB — CBC WITH DIFFERENTIAL/PLATELET
Absolute Monocytes: 786 cells/uL (ref 200–950)
Basophils Absolute: 78 cells/uL (ref 0–200)
Basophils Relative: 0.8 %
Eosinophils Absolute: 175 cells/uL (ref 15–500)
Eosinophils Relative: 1.8 %
HCT: 45.4 % — ABNORMAL HIGH (ref 35.0–45.0)
Hemoglobin: 15.4 g/dL (ref 11.7–15.5)
Lymphs Abs: 2600 cells/uL (ref 850–3900)
MCH: 33.1 pg — ABNORMAL HIGH (ref 27.0–33.0)
MCHC: 33.9 g/dL (ref 32.0–36.0)
MCV: 97.6 fL (ref 80.0–100.0)
MPV: 11 fL (ref 7.5–12.5)
Monocytes Relative: 8.1 %
Neutro Abs: 6063 cells/uL (ref 1500–7800)
Neutrophils Relative %: 62.5 %
Platelets: 233 10*3/uL (ref 140–400)
RBC: 4.65 10*6/uL (ref 3.80–5.10)
RDW: 11.7 % (ref 11.0–15.0)
Total Lymphocyte: 26.8 %
WBC: 9.7 10*3/uL (ref 3.8–10.8)

## 2022-08-05 LAB — LIPID PANEL
Cholesterol: 152 mg/dL (ref <200)
HDL: 54 mg/dL (ref >=50)
LDL Cholesterol (Calc): 74 mg/dL (calc)
Non-HDL Cholesterol (Calc): 98 mg/dL (calc) (ref <130)
Total CHOL/HDL Ratio: 2.8 (calc) (ref <5.0)
Triglycerides: 154 mg/dL — ABNORMAL HIGH (ref <150)

## 2022-08-05 LAB — MAGNESIUM: Magnesium: 2.3 mg/dL (ref 1.5–2.5)

## 2022-08-05 LAB — TSH: TSH: 2 mIU/L (ref 0.40–4.50)

## 2022-08-05 LAB — VITAMIN D 25 HYDROXY (VIT D DEFICIENCY, FRACTURES): Vit D, 25-Hydroxy: 29 ng/mL — ABNORMAL LOW (ref 30–100)

## 2022-08-05 LAB — HEMOGLOBIN A1C
Hgb A1c MFr Bld: 7.6 % of total Hgb — ABNORMAL HIGH (ref <5.7)
Mean Plasma Glucose: 171 mg/dL
eAG (mmol/L): 9.5 mmol/L

## 2022-08-05 LAB — MICROALBUMIN / CREATININE URINE RATIO
Creatinine, Urine: 140 mg/dL (ref 20–275)
Microalb Creat Ratio: 2279 mcg/mg creat — ABNORMAL HIGH (ref <30)
Microalb, Ur: 319.1 mg/dL

## 2022-08-05 LAB — MICROSCOPIC MESSAGE

## 2022-08-09 ENCOUNTER — Ambulatory Visit (INDEPENDENT_AMBULATORY_CARE_PROVIDER_SITE_OTHER): Payer: Medicare Other | Admitting: Physician Assistant

## 2022-08-09 ENCOUNTER — Other Ambulatory Visit: Payer: Self-pay

## 2022-08-09 ENCOUNTER — Ambulatory Visit (INDEPENDENT_AMBULATORY_CARE_PROVIDER_SITE_OTHER)
Admission: RE | Admit: 2022-08-09 | Discharge: 2022-08-09 | Disposition: A | Discharge status: Home / Self Care | Payer: Medicare Other | Source: Ambulatory Visit | Attending: Surgery | Admitting: Surgery

## 2022-08-09 ENCOUNTER — Ambulatory Visit (HOSPITAL_COMMUNITY)
Admission: RE | Admit: 2022-08-09 | Discharge: 2022-08-09 | Disposition: A | Discharge status: Home / Self Care | Payer: Medicare Other | Source: Ambulatory Visit | Attending: Surgery | Admitting: Surgery

## 2022-08-09 DIAGNOSIS — I739 Peripheral vascular disease, unspecified: Secondary | ICD-10-CM | POA: Insufficient documentation

## 2022-08-09 DIAGNOSIS — N184 Chronic kidney disease, stage 4 (severe): Secondary | ICD-10-CM | POA: Diagnosis not present

## 2022-08-09 DIAGNOSIS — I129 Hypertensive chronic kidney disease with stage 1 through stage 4 chronic kidney disease, or unspecified chronic kidney disease: Secondary | ICD-10-CM | POA: Diagnosis not present

## 2022-08-09 DIAGNOSIS — N2581 Secondary hyperparathyroidism of renal origin: Secondary | ICD-10-CM | POA: Diagnosis not present

## 2022-08-09 DIAGNOSIS — D631 Anemia in chronic kidney disease: Secondary | ICD-10-CM | POA: Diagnosis not present

## 2022-08-09 DIAGNOSIS — R42 Dizziness and giddiness: Secondary | ICD-10-CM | POA: Diagnosis not present

## 2022-08-09 DIAGNOSIS — R809 Proteinuria, unspecified: Secondary | ICD-10-CM | POA: Diagnosis not present

## 2022-08-09 DIAGNOSIS — I70243 Atherosclerosis of 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: Secondary | ICD-10-CM

## 2022-08-09 DIAGNOSIS — E1122 Type 2 diabetes mellitus with diabetic chronic kidney disease: Secondary | ICD-10-CM | POA: Diagnosis not present

## 2022-08-09 NOTE — Progress Notes (Unsigned)
Virtual Visit via Telephone Note   I connected with Lacy Duverney on 08/10/2022 using the [employer] by telephone and verified that I was speaking with the correct person using two identifiers. Patient was located in the car and accompanied by her husband. I am located at [facility].   The limitations of evaluation and management by telemedicine and the availability of in person appointments have been previously discussed with the patient and are documented in the patients chart. The patient expressed understanding and consented to proceed.  PCP: [name], MD  Chief Complaint: PAD surveillance  History of Present Illness: [name] is a 76 y.o. female who underwent surveillance for PAD.  She has history of left SFA stenting in 2012 which ultimately failed.  She then required a left femoral to below the knee popliteal bypass with vein by Dr. Trula Slade on 11/18/2021 due to left ankle wound.  Postoperatively she developed stenosis at the distal anastomosis which required drug-coated balloon angioplasty on 03/09/2022 by Dr. Trula Slade.  She denies claudication.  She also denies any further tissue loss or rest pain.  She is on aspirin and statin daily.  She continues to smoke half a pack a day.  Past medical history also significant for chronic kidney disease.  She also visited the nephrologist today who was concerned about her declining kidney function.  She will follow-up with them in 3 months.    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,  "small"   Anxiety    Arthritis    Cataract    CKD (chronic kidney disease), stage IV ([place])    followed by [facility]   Colon polyps    Diabetes mellitus 1998   Diverticulosis    Emphysema of lung ([place])    "no one has evry told me that"   ESOPHAGEAL STRICTURE 08/27/2008   Family history of adverse reaction to anesthesia    daughter  n/v   GERD (gastroesophageal reflux disease)    Hemorrhoids    Hiatal hernia    Hyperlipidemia    Hypertension 2000   Migraines    Neuropathy    fingers and toe   Peripheral vascular disease ([place])    Phlebitis    30 years ago  left leg   Stroke [facility])    multiple mini strokes ( brain aneurysm ).  Right side weaker.    Past Surgical History:  Procedure Laterality Date   ABDOMINAL AORTAGRAM N/A 01/04/2012   Procedure: ABDOMINAL AORTAGRAM;  Surgeon: Serafina Mitchell, MD;  Location[facility] CATH LAB;  Service: Cardiovascular;  Laterality: N/A;   ABDOMINAL AORTAGRAM N/A 08/15/2012   Procedure: ABDOMINAL Maxcine Ham;  Surgeon: Serafina Mitchell, MD;  Location: [facility] CATH LAB;  Service: Cardiovascular;  Laterality: N/A;   ABDOMINAL AORTOGRAM W/LOWER EXTREMITY Left 11/17/2021   Procedure: ABDOMINAL AORTOGRAM W/LOWER EXTREMITY;  Surgeon: Serafina Mitchell, MD;  Location: [place] CV LAB;  Service: Cardiovascular;  Laterality: Left;   ABDOMINAL AORTOGRAM W/LOWER EXTREMITY N/A 03/09/2022   Procedure: ABDOMINAL AORTOGRAM W/LOWER EXTREMITY;  Surgeon: Serafina Mitchell, MD;  Location: [place] CV LAB;  Service: Cardiovascular;  Laterality: N/A;   ABDOMINAL HYSTERECTOMY  1984   cataract surgery Right 10-22-2015   cataract surgery Left 11-05-2015   CESAREAN SECTION     CHOLECYSTECTOMY     COLONOSCOPY  07/2010   DENTAL SURGERY  Aug. 16, 2013  left lower    ESOPHAGOGASTRODUODENOSCOPY  07/2010   EYE SURGERY  Nov. 2014   Laser-Glaucoma   FEMORAL ARTERY STENT  05/11/11   Left superficial femoral and popliteal artery   FEMORAL-POPLITEAL BYPASS GRAFT Left 11/18/2021   Procedure: LEFT FEMORAL-POPLITEAL ARTERY BYPASS GRAFT;  Surgeon: Serafina Mitchell, MD;  Location: [place];  Service: Vascular;  Laterality: Left;   HERNIA REPAIR     times two   KNEE SURGERY     LOWER EXTREMITY ANGIOGRAM Left 08/15/2012   Procedure: LOWER EXTREMITY ANGIOGRAM;  Surgeon: Serafina Mitchell, MD;  Location: [facility] CATH LAB;  Service:  Cardiovascular;  Laterality: Left;  lt leg angio   PERIPHERAL VASCULAR BALLOON ANGIOPLASTY Left 03/09/2022   Procedure: PERIPHERAL VASCULAR BALLOON ANGIOPLASTY;  Surgeon: Serafina Mitchell, MD;  Location: [place] CV LAB;  Service: Cardiovascular;  Laterality: Left;   rotator cuff surgery     THYROID SURGERY      No outpatient medications have been marked as taking for the 08/09/22 encounter (Office Visit) with Dagoberto Ligas, PA-C.    12 system ROS was negative unless otherwise noted in HPI   Observations/Objective: Left graft duplex significant for 393 cm/s inflow velocity as well as 240 cm/s proximal graft.  Distal anastomosis widely patent  ABI/TBIToday's ABIToday's TBIPrevious ABIPrevious TBI  +-------+-----------+-----------+------------+------------+  Right 0.41       0.32       0.52        0.39          +-------+-----------+-----------+------------+------------+  Left  0.81       0.53       0.78        0.61          +-------+-----------+-----------+------------+------------+   Assessment and Plan: Ms. [name] is a 76 year old female who came to the office for noninvasive studies.  Most recently she underwent distal anastomosis drug-coated balloon angioplasty by Dr. Trula Slade in June of this year.  Based on duplex today she now has a significant proximal anastomosis stenosis.  This however does not affect the velocity throughout her graft and also does not affect her ABI.  She also is without claudication, rest pain, or tissue loss.  Unfortunately she also has declining kidney function now with a creatinine of 2.5.  She is in close follow-up with nephrology and will have another appointment in 3 months.  Given her declining kidney function as well as no change in ABI or flow through velocity despite proximal graft anastomosis, we will watch her graft closely with repeat surveillance in 3 months.  She will continue her aspirin and statin daily.  I encouraged  smoking cessation.  She knows to call/return office sooner with any questions or concerns.  Follow Up Instructions:   Follow up in 3 month(s)   I discussed the assessment and treatment plan with the patient. The patient was provided an opportunity to ask questions and all were answered. The patient agreed with the plan and demonstrated an understanding of the instructions.   The patient was advised to call back or seek an in-person evaluation if the symptoms worsen or if the condition fails to improve as anticipated.  I spent 12 minutes with the patient via telephone encounter.   Signed, Dagoberto Ligas [facility] Office: [phone]  08/10/2022, 9:44 AM

## 2022-08-16 ENCOUNTER — Telehealth: Payer: Self-pay | Admitting: Nurse Practitioner

## 2022-08-16 NOTE — Telephone Encounter (Signed)
Called patient and told her.

## 2022-08-16 NOTE — Telephone Encounter (Signed)
Due to pt's recent kidney check and her levels have dropped to 21, her kidney specialist does not recommend getting any imaging with dye bc to could affect her kidneys further. Wanting to know if the imaging [name] ordered can be changed to be without dye, she is requesting a call back if possible.

## 2022-08-16 NOTE — Telephone Encounter (Signed)
Her CT is ordered without contrast dye

## 2022-08-26 ENCOUNTER — Encounter: Payer: Self-pay | Admitting: Nurse Practitioner

## 2022-08-26 ENCOUNTER — Other Ambulatory Visit: Payer: Self-pay | Admitting: Nurse Practitioner

## 2022-08-26 DIAGNOSIS — K219 Gastro-esophageal reflux disease without esophagitis: Secondary | ICD-10-CM

## 2022-08-26 DIAGNOSIS — F419 Anxiety disorder, unspecified: Secondary | ICD-10-CM

## 2022-08-27 ENCOUNTER — Inpatient Hospital Stay (HOSPITAL_COMMUNITY)
Admission: EM | Admit: 2022-08-27 | Discharge: 2022-08-30 | DRG: 305 | Disposition: A | Discharge status: Home / Self Care | Payer: Medicare Other | Attending: Internal Medicine | Admitting: Internal Medicine

## 2022-08-27 ENCOUNTER — Ambulatory Visit: Admission: EM | Admit: 2022-08-27 | Discharge: 2022-08-27 | Payer: Medicare Other

## 2022-08-27 ENCOUNTER — Encounter (HOSPITAL_COMMUNITY): Payer: Self-pay

## 2022-08-27 ENCOUNTER — Emergency Department (HOSPITAL_COMMUNITY): Payer: Medicare Other

## 2022-08-27 DIAGNOSIS — E1159 Type 2 diabetes mellitus with other circulatory complications: Secondary | ICD-10-CM | POA: Diagnosis present

## 2022-08-27 DIAGNOSIS — G43909 Migraine, unspecified, not intractable, without status migrainosus: Secondary | ICD-10-CM | POA: Diagnosis present

## 2022-08-27 DIAGNOSIS — N184 Chronic kidney disease, stage 4 (severe): Secondary | ICD-10-CM | POA: Diagnosis not present

## 2022-08-27 DIAGNOSIS — F411 Generalized anxiety disorder: Secondary | ICD-10-CM | POA: Diagnosis not present

## 2022-08-27 DIAGNOSIS — Z1152 Encounter for screening for COVID-19: Secondary | ICD-10-CM | POA: Diagnosis not present

## 2022-08-27 DIAGNOSIS — Z833 Family history of diabetes mellitus: Secondary | ICD-10-CM | POA: Diagnosis not present

## 2022-08-27 DIAGNOSIS — I129 Hypertensive chronic kidney disease with stage 1 through stage 4 chronic kidney disease, or unspecified chronic kidney disease: Secondary | ICD-10-CM | POA: Diagnosis present

## 2022-08-27 DIAGNOSIS — R0789 Other chest pain: Secondary | ICD-10-CM | POA: Diagnosis not present

## 2022-08-27 DIAGNOSIS — Z880 Allergy status to penicillin: Secondary | ICD-10-CM

## 2022-08-27 DIAGNOSIS — R42 Dizziness and giddiness: Secondary | ICD-10-CM | POA: Diagnosis not present

## 2022-08-27 DIAGNOSIS — E1151 Type 2 diabetes mellitus with diabetic peripheral angiopathy without gangrene: Secondary | ICD-10-CM | POA: Diagnosis present

## 2022-08-27 DIAGNOSIS — K219 Gastro-esophageal reflux disease without esophagitis: Secondary | ICD-10-CM | POA: Diagnosis not present

## 2022-08-27 DIAGNOSIS — E1169 Type 2 diabetes mellitus with other specified complication: Secondary | ICD-10-CM | POA: Diagnosis not present

## 2022-08-27 DIAGNOSIS — R079 Chest pain, unspecified: Secondary | ICD-10-CM | POA: Diagnosis not present

## 2022-08-27 DIAGNOSIS — Z888 Allergy status to other drugs, medicaments and biological substances status: Secondary | ICD-10-CM | POA: Diagnosis not present

## 2022-08-27 DIAGNOSIS — Z95820 Peripheral vascular angioplasty status with implants and grafts: Secondary | ICD-10-CM

## 2022-08-27 DIAGNOSIS — R0602 Shortness of breath: Secondary | ICD-10-CM | POA: Diagnosis not present

## 2022-08-27 DIAGNOSIS — E1351 Other specified diabetes mellitus with diabetic peripheral angiopathy without gangrene: Secondary | ICD-10-CM | POA: Diagnosis not present

## 2022-08-27 DIAGNOSIS — H93A9 Pulsatile tinnitus, unspecified ear: Secondary | ICD-10-CM | POA: Diagnosis present

## 2022-08-27 DIAGNOSIS — E1142 Type 2 diabetes mellitus with diabetic polyneuropathy: Secondary | ICD-10-CM | POA: Diagnosis present

## 2022-08-27 DIAGNOSIS — Z90710 Acquired absence of both cervix and uterus: Secondary | ICD-10-CM

## 2022-08-27 DIAGNOSIS — I160 Hypertensive urgency: Principal | ICD-10-CM | POA: Diagnosis present

## 2022-08-27 DIAGNOSIS — Z794 Long term (current) use of insulin: Secondary | ICD-10-CM

## 2022-08-27 DIAGNOSIS — Z8672 Personal history of thrombophlebitis: Secondary | ICD-10-CM

## 2022-08-27 DIAGNOSIS — R0609 Other forms of dyspnea: Secondary | ICD-10-CM | POA: Diagnosis not present

## 2022-08-27 DIAGNOSIS — E1122 Type 2 diabetes mellitus with diabetic chronic kidney disease: Secondary | ICD-10-CM | POA: Diagnosis not present

## 2022-08-27 DIAGNOSIS — Z7982 Long term (current) use of aspirin: Secondary | ICD-10-CM

## 2022-08-27 DIAGNOSIS — Z8719 Personal history of other diseases of the digestive system: Secondary | ICD-10-CM

## 2022-08-27 DIAGNOSIS — J439 Emphysema, unspecified: Secondary | ICD-10-CM | POA: Diagnosis present

## 2022-08-27 DIAGNOSIS — F17210 Nicotine dependence, cigarettes, uncomplicated: Secondary | ICD-10-CM | POA: Diagnosis present

## 2022-08-27 DIAGNOSIS — Z8673 Personal history of transient ischemic attack (TIA), and cerebral infarction without residual deficits: Secondary | ICD-10-CM

## 2022-08-27 DIAGNOSIS — Z8249 Family history of ischemic heart disease and other diseases of the circulatory system: Secondary | ICD-10-CM | POA: Diagnosis not present

## 2022-08-27 DIAGNOSIS — Z9049 Acquired absence of other specified parts of digestive tract: Secondary | ICD-10-CM

## 2022-08-27 DIAGNOSIS — Z9841 Cataract extraction status, right eye: Secondary | ICD-10-CM

## 2022-08-27 DIAGNOSIS — E785 Hyperlipidemia, unspecified: Secondary | ICD-10-CM | POA: Diagnosis present

## 2022-08-27 DIAGNOSIS — Z79899 Other long term (current) drug therapy: Secondary | ICD-10-CM

## 2022-08-27 DIAGNOSIS — H538 Other visual disturbances: Secondary | ICD-10-CM | POA: Insufficient documentation

## 2022-08-27 DIAGNOSIS — Z9842 Cataract extraction status, left eye: Secondary | ICD-10-CM

## 2022-08-27 DIAGNOSIS — Z823 Family history of stroke: Secondary | ICD-10-CM

## 2022-08-27 DIAGNOSIS — R519 Headache, unspecified: Secondary | ICD-10-CM | POA: Diagnosis not present

## 2022-08-27 LAB — CBC
HCT: 45.7 % (ref 36.0–46.0)
Hemoglobin: 14.4 g/dL (ref 12.0–15.0)
MCH: 32.2 pg (ref 26.0–34.0)
MCHC: 31.5 g/dL (ref 30.0–36.0)
MCV: 102.2 fL — ABNORMAL HIGH (ref 80.0–100.0)
Platelets: 214 10*3/uL (ref 150–400)
RBC: 4.47 MIL/uL (ref 3.87–5.11)
RDW: 11.6 % (ref 11.5–15.5)
WBC: 9 10*3/uL (ref 4.0–10.5)
nRBC: 0 % (ref 0.0–0.2)

## 2022-08-27 LAB — BASIC METABOLIC PANEL
Anion gap: 7 (ref 5–15)
BUN: 27 mg/dL — ABNORMAL HIGH (ref 8–23)
CO2: 29 mmol/L (ref 22–32)
Calcium: 8.6 mg/dL — ABNORMAL LOW (ref 8.9–10.3)
Chloride: 104 mmol/L (ref 98–111)
Creatinine, Ser: 2.2 mg/dL — ABNORMAL HIGH (ref 0.44–1.00)
GFR, Estimated: 23 mL/min — ABNORMAL LOW (ref >60)
Glucose, Bld: 337 mg/dL — ABNORMAL HIGH (ref 70–99)
Potassium: 4.2 mmol/L (ref 3.5–5.1)
Sodium: 140 mmol/L (ref 135–145)

## 2022-08-27 LAB — TROPONIN I (HIGH SENSITIVITY)
Troponin I (High Sensitivity): 30 ng/L — ABNORMAL HIGH (ref <18)
Troponin I (High Sensitivity): 30 ng/L — ABNORMAL HIGH (ref <18)

## 2022-08-27 NOTE — ED Triage Notes (Signed)
Patient c/o dizziness, SOB, and chest pain since 08/08/22.  Patient  went to a [facility] and was sent for further evaluation.

## 2022-08-27 NOTE — ED Provider Notes (Signed)
Patient here today for evaluation of shortness of breath, dizziness, chest discomfort and headache that has been ongoing for a few weeks. Patient with significant PMH including aneurysm and CVA- recommended further evaluation in the ED for stat labs and imaging. Patient is agreeable and family member with her today is agreeable to transport via POV.    Francene Finders, PA-C 08/27/22 1700

## 2022-08-27 NOTE — ED Provider Triage Note (Addendum)
Emergency Medicine Provider Triage Evaluation Note  [name] , a 76 y.o. female  was evaluated in triage.  Pt complains of dizziness, chest [name], and shortness of breath.  Patient has been having dizziness for a few weeks, but has been having chest [name] and shortness of breath for the past week that is getting worse.  She also has a persistent daily headache.  Patient evaluated at [facility] and was sent to ED for further evaluation.  Denies fever, nausea, vomiting.  Recent change in reflux medicine.   Review of Systems  Positive: See above Negative: See above  Physical Exam  BP (!) 185/61 (BP Location: Right Arm)   Pulse 63   Temp 98.9 F (37.2 C) (Oral)   Resp 18   Ht '5\' 6"'$  (1.676 m)   Wt 84.8 kg   SpO2 97%   BMI 30.18 kg/m  Gen:   Awake, no distress   Resp:  Normal effort  MSK:   Moves extremities without difficulty  Other:  Lungs clear to auscultation bilaterally, heart rate and rhythm normal  Medical Decision Making  Medically screening exam initiated at 7:01 PM.  Appropriate orders placed.  [name] was informed that the remainder of the evaluation will be completed by another provider, this initial triage assessment does not replace that evaluation, and the importance of remaining in the ED until their evaluation is complete.     Theressa Stamps R, [place] 08/27/22 1904    Pat Kocher, [place] 08/27/22 1921

## 2022-08-27 NOTE — ED Triage Notes (Signed)
Pt presents with ongoing shortness of breath X 2 weeks and dizziness; pt states she has intermittent vertigo that has been unrelieved with meclizine.

## 2022-08-28 ENCOUNTER — Observation Stay (HOSPITAL_COMMUNITY): Payer: Medicare Other

## 2022-08-28 ENCOUNTER — Other Ambulatory Visit: Payer: Self-pay

## 2022-08-28 ENCOUNTER — Emergency Department (HOSPITAL_COMMUNITY): Payer: Medicare Other

## 2022-08-28 DIAGNOSIS — I160 Hypertensive urgency: Secondary | ICD-10-CM | POA: Diagnosis present

## 2022-08-28 DIAGNOSIS — R079 Chest pain, unspecified: Secondary | ICD-10-CM | POA: Diagnosis present

## 2022-08-28 DIAGNOSIS — R0609 Other forms of dyspnea: Secondary | ICD-10-CM | POA: Diagnosis not present

## 2022-08-28 DIAGNOSIS — R42 Dizziness and giddiness: Secondary | ICD-10-CM

## 2022-08-28 DIAGNOSIS — R519 Headache, unspecified: Secondary | ICD-10-CM | POA: Diagnosis not present

## 2022-08-28 DIAGNOSIS — H538 Other visual disturbances: Secondary | ICD-10-CM | POA: Insufficient documentation

## 2022-08-28 LAB — BASIC METABOLIC PANEL
Anion gap: 6 (ref 5–15)
BUN: 27 mg/dL — ABNORMAL HIGH (ref 8–23)
CO2: 27 mmol/L (ref 22–32)
Calcium: 8.7 mg/dL — ABNORMAL LOW (ref 8.9–10.3)
Chloride: 107 mmol/L (ref 98–111)
Creatinine, Ser: 2.06 mg/dL — ABNORMAL HIGH (ref 0.44–1.00)
GFR, Estimated: 25 mL/min — ABNORMAL LOW (ref >60)
Glucose, Bld: 146 mg/dL — ABNORMAL HIGH (ref 70–99)
Potassium: 4.2 mmol/L (ref 3.5–5.1)
Sodium: 140 mmol/L (ref 135–145)

## 2022-08-28 LAB — URINALYSIS, ROUTINE W REFLEX MICROSCOPIC
Bacteria, UA: NONE SEEN
Bilirubin Urine: NEGATIVE
Glucose, UA: >=500 mg/dL — AB
Ketones, ur: NEGATIVE mg/dL
Leukocytes,Ua: NEGATIVE
Nitrite: NEGATIVE
Protein, ur: >=300 mg/dL — AB
Specific Gravity, Urine: 1.026 (ref 1.005–1.030)
pH: 5 (ref 5.0–8.0)

## 2022-08-28 LAB — ECHOCARDIOGRAM COMPLETE
Area-P 1/2: 2.2 cm2
Calc EF: 64 %
Height: 66 in
MV VTI: 2.71 cm2
S' Lateral: 3.3 cm
Single Plane A2C EF: 61.3 %
Single Plane A4C EF: 64.9 %
Weight: 2992 oz

## 2022-08-28 LAB — RESP PANEL BY RT-PCR (FLU A&B, COVID) ARPGX2
Influenza A by PCR: NEGATIVE
Influenza B by PCR: NEGATIVE
SARS Coronavirus 2 by RT PCR: NEGATIVE

## 2022-08-28 LAB — CBG MONITORING, ED
Glucose-Capillary: 171 mg/dL — ABNORMAL HIGH (ref 70–99)
Glucose-Capillary: 169 mg/dL — ABNORMAL HIGH (ref 70–99)

## 2022-08-28 LAB — VITAMIN B12: Vitamin B-12: 554 pg/mL (ref 180–914)

## 2022-08-28 LAB — GLUCOSE, CAPILLARY
Glucose-Capillary: 123 mg/dL — ABNORMAL HIGH (ref 70–99)
Glucose-Capillary: 263 mg/dL — ABNORMAL HIGH (ref 70–99)

## 2022-08-28 LAB — BRAIN NATRIURETIC PEPTIDE: B Natriuretic Peptide: 75.3 pg/mL (ref 0.0–100.0)

## 2022-08-28 LAB — TSH: TSH: 3.915 u[IU]/mL (ref 0.350–4.500)

## 2022-08-28 LAB — TROPONIN I (HIGH SENSITIVITY): Troponin I (High Sensitivity): 21 ng/L — ABNORMAL HIGH (ref <18)

## 2022-08-28 LAB — D-DIMER, QUANTITATIVE: D-Dimer, Quant: 0.79 ug/mL-FEU — ABNORMAL HIGH (ref 0.00–0.50)

## 2022-08-28 MED ORDER — METOCLOPRAMIDE HCL 5 MG/ML IJ SOLN
10.0000 mg | Freq: Once | INTRAMUSCULAR | Status: AC
  Administered 2022-08-28: 10 mg via INTRAVENOUS
  Filled 2022-08-28: qty 2

## 2022-08-28 MED ORDER — INSULIN DEGLUDEC 100 UNIT/ML ~~LOC~~ SOPN: 40.0000 [IU] | PEN_INJECTOR | Freq: Every day | SUBCUTANEOUS | Status: DC

## 2022-08-28 MED ORDER — INSULIN ASPART 100 UNIT/ML IJ SOLN
0.0000 [IU] | Freq: Three times a day (TID) | INTRAMUSCULAR | Status: DC
  Administered 2022-08-28 (×2): 2 [IU] via SUBCUTANEOUS
  Administered 2022-08-29 (×2): 3 [IU] via SUBCUTANEOUS
  Administered 2022-08-30: 5 [IU] via SUBCUTANEOUS
  Filled 2022-08-28: qty 0.09

## 2022-08-28 MED ORDER — ASPIRIN 325 MG PO TBEC
325.0000 mg | DELAYED_RELEASE_TABLET | Freq: Every day | ORAL | Status: DC
  Administered 2022-08-28 – 2022-08-29 (×2): 325 mg via ORAL
  Filled 2022-08-28 (×2): qty 1

## 2022-08-28 MED ORDER — ACETAMINOPHEN 650 MG RE SUPP: 650.0000 mg | Freq: Four times a day (QID) | RECTAL | Status: DC | PRN

## 2022-08-28 MED ORDER — DIPHENHYDRAMINE HCL 50 MG/ML IJ SOLN
12.5000 mg | Freq: Once | INTRAMUSCULAR | Status: AC
  Administered 2022-08-28: 12.5 mg via INTRAVENOUS
  Filled 2022-08-28: qty 1

## 2022-08-28 MED ORDER — HEPARIN BOLUS VIA INFUSION
2000.0000 [IU] | Freq: Once | INTRAVENOUS | Status: AC
  Administered 2022-08-28: 2000 [IU] via INTRAVENOUS
  Filled 2022-08-28: qty 2000

## 2022-08-28 MED ORDER — SODIUM CHLORIDE 0.9 % IV SOLN: 250.0000 mL | INTRAVENOUS | Status: DC | PRN

## 2022-08-28 MED ORDER — HYDRALAZINE HCL 20 MG/ML IJ SOLN
5.0000 mg | Freq: Four times a day (QID) | INTRAMUSCULAR | Status: DC | PRN
  Administered 2022-08-28 (×2): 5 mg via INTRAVENOUS
  Filled 2022-08-28 (×2): qty 1

## 2022-08-28 MED ORDER — MECLIZINE HCL 25 MG PO TABS
25.0000 mg | ORAL_TABLET | Freq: Once | ORAL | Status: AC
  Administered 2022-08-28: 25 mg via ORAL
  Filled 2022-08-28: qty 1

## 2022-08-28 MED ORDER — DAPAGLIFLOZIN PROPANEDIOL 10 MG PO TABS: 10.0000 mg | ORAL_TABLET | Freq: Every day | ORAL | Status: DC

## 2022-08-28 MED ORDER — LORAZEPAM 2 MG/ML IJ SOLN
1.0000 mg | Freq: Once | INTRAMUSCULAR | Status: AC
  Administered 2022-08-28: 1 mg via INTRAVENOUS
  Filled 2022-08-28: qty 1

## 2022-08-28 MED ORDER — HEPARIN (PORCINE) 25000 UT/250ML-% IV SOLN: 12.0000 [IU]/kg/h | INTRAVENOUS | Status: DC

## 2022-08-28 MED ORDER — AMLODIPINE BESYLATE 10 MG PO TABS
10.0000 mg | ORAL_TABLET | Freq: Every day | ORAL | Status: DC
  Administered 2022-08-28 – 2022-08-30 (×3): 10 mg via ORAL
  Filled 2022-08-28: qty 1
  Filled 2022-08-28: qty 2
  Filled 2022-08-28: qty 1

## 2022-08-28 MED ORDER — ROSUVASTATIN CALCIUM 20 MG PO TABS: 40.0000 mg | ORAL_TABLET | Freq: Every day | ORAL | Status: DC

## 2022-08-28 MED ORDER — INSULIN GLARGINE-YFGN 100 UNIT/ML ~~LOC~~ SOLN
40.0000 [IU] | Freq: Every day | SUBCUTANEOUS | Status: DC
  Administered 2022-08-28 – 2022-08-30 (×3): 40 [IU] via SUBCUTANEOUS
  Filled 2022-08-28 (×3): qty 0.4

## 2022-08-28 MED ORDER — MECLIZINE HCL 25 MG PO TABS: 12.5000 mg | ORAL_TABLET | Freq: Three times a day (TID) | ORAL | Status: DC | PRN

## 2022-08-28 MED ORDER — POLYETHYL GLYCOL-PROPYL GLYCOL 0.4-0.3 % OP SOLN: Freq: Every day | OPHTHALMIC | Status: DC

## 2022-08-28 MED ORDER — HEPARIN (PORCINE) 25000 UT/250ML-% IV SOLN
1200.0000 [IU]/h | INTRAVENOUS | Status: DC
  Administered 2022-08-28: 1200 [IU]/h via INTRAVENOUS
  Filled 2022-08-28: qty 250

## 2022-08-28 MED ORDER — ROPINIROLE HCL 0.25 MG PO TABS
0.2500 mg | ORAL_TABLET | Freq: Every day | ORAL | Status: DC
  Administered 2022-08-28 – 2022-08-29 (×2): 0.25 mg via ORAL
  Filled 2022-08-28 (×2): qty 1

## 2022-08-28 MED ORDER — ACETAMINOPHEN 325 MG PO TABS
650.0000 mg | ORAL_TABLET | Freq: Four times a day (QID) | ORAL | Status: DC | PRN
  Administered 2022-08-30: 650 mg via ORAL
  Filled 2022-08-28: qty 2

## 2022-08-28 MED ORDER — SODIUM CHLORIDE 0.9% FLUSH: 3.0000 mL | INTRAVENOUS | Status: DC | PRN

## 2022-08-28 MED ORDER — SODIUM CHLORIDE 0.9% FLUSH
3.0000 mL | Freq: Two times a day (BID) | INTRAVENOUS | Status: DC
  Administered 2022-08-28 – 2022-08-30 (×3): 3 mL via INTRAVENOUS

## 2022-08-28 MED ORDER — NICOTINE 14 MG/24HR TD PT24
14.0000 mg | MEDICATED_PATCH | Freq: Every day | TRANSDERMAL | Status: DC
  Administered 2022-08-28 – 2022-08-30 (×3): 14 mg via TRANSDERMAL
  Filled 2022-08-28 (×3): qty 1

## 2022-08-28 MED ORDER — BUSPIRONE HCL 10 MG PO TABS: 5.0000 mg | ORAL_TABLET | Freq: Three times a day (TID) | ORAL | Status: DC | PRN

## 2022-08-28 MED ORDER — BUSPIRONE HCL 5 MG PO TABS
5.0000 mg | ORAL_TABLET | Freq: Three times a day (TID) | ORAL | Status: DC
  Administered 2022-08-28 – 2022-08-30 (×8): 5 mg via ORAL
  Filled 2022-08-28 (×8): qty 1

## 2022-08-28 MED ORDER — CITALOPRAM HYDROBROMIDE 20 MG PO TABS
40.0000 mg | ORAL_TABLET | Freq: Every day | ORAL | Status: DC
  Administered 2022-08-28 – 2022-08-30 (×3): 40 mg via ORAL
  Filled 2022-08-28: qty 4
  Filled 2022-08-28 (×2): qty 2

## 2022-08-28 MED ORDER — IRBESARTAN 300 MG PO TABS
300.0000 mg | ORAL_TABLET | Freq: Every day | ORAL | Status: DC
  Administered 2022-08-28 – 2022-08-30 (×3): 300 mg via ORAL
  Filled 2022-08-28 (×3): qty 1

## 2022-08-28 MED ORDER — ATORVASTATIN CALCIUM 20 MG PO TABS
80.0000 mg | ORAL_TABLET | Freq: Every day | ORAL | Status: DC
  Administered 2022-08-28 – 2022-08-30 (×3): 80 mg via ORAL
  Filled 2022-08-28: qty 4
  Filled 2022-08-28: qty 2
  Filled 2022-08-28: qty 4

## 2022-08-28 MED ORDER — POLYVINYL ALCOHOL 1.4 % OP SOLN
1.0000 [drp] | Freq: Every day | OPHTHALMIC | Status: DC
  Administered 2022-08-29 – 2022-08-30 (×2): 1 [drp] via OPHTHALMIC
  Filled 2022-08-28: qty 15

## 2022-08-28 MED ORDER — HEPARIN SODIUM (PORCINE) 5000 UNIT/ML IJ SOLN
5000.0000 [IU] | Freq: Three times a day (TID) | INTRAMUSCULAR | Status: DC
  Administered 2022-08-28 – 2022-08-30 (×8): 5000 [IU] via SUBCUTANEOUS
  Filled 2022-08-28 (×8): qty 1

## 2022-08-28 MED ORDER — ACETAMINOPHEN 500 MG PO TABS
1000.0000 mg | ORAL_TABLET | Freq: Once | ORAL | Status: AC
  Administered 2022-08-28: 1000 mg via ORAL
  Filled 2022-08-28: qty 2

## 2022-08-28 MED ORDER — HYDRALAZINE HCL 20 MG/ML IJ SOLN
2.0000 mg | Freq: Once | INTRAMUSCULAR | Status: AC
  Administered 2022-08-28: 2 mg via INTRAVENOUS
  Filled 2022-08-28: qty 1

## 2022-08-28 MED ORDER — PANTOPRAZOLE SODIUM 40 MG PO TBEC
40.0000 mg | DELAYED_RELEASE_TABLET | Freq: Every day | ORAL | Status: DC
  Administered 2022-08-28 – 2022-08-30 (×3): 40 mg via ORAL
  Filled 2022-08-28 (×3): qty 1

## 2022-08-28 NOTE — Progress Notes (Signed)
  Echocardiogram 2D Echocardiogram has been performed.  Eartha Inch 08/28/2022, 2:04 PM

## 2022-08-28 NOTE — Assessment & Plan Note (Addendum)
Continue high intensity statin

## 2022-08-28 NOTE — Assessment & Plan Note (Signed)
Has been having blurry vision in her right eye for months. Recommend ophthalmology visit

## 2022-08-28 NOTE — Assessment & Plan Note (Addendum)
Baseline creatinine 2.2-2.5 Stable Strict I/O

## 2022-08-28 NOTE — Assessment & Plan Note (Signed)
Continue celexa '40mg'$  daily and buspar PRN TID

## 2022-08-28 NOTE — Assessment & Plan Note (Signed)
Recent A1C 7.6 Continue farxiga and long acting insulin tresiba at 40 units SSI and accuchecks qac/hs

## 2022-08-28 NOTE — H&P (Addendum)
History and Physical    Patient: [name]:[id number] DOB: 11/11/1945 DOA: 08/27/2022 DOS: the patient was seen and examined on 08/28/2022 PCP: [name], MD  Patient coming from: Home - lives with her husband. Ambulates independently.    Chief Complaint: dizziness/shortness of breath and chest tightness   HPI: [name] is a 76 y.o. female with medical history significant of CKD stage IV, T2DM, HTN, HLD, GERD, hx of CVA, PVD who presented to ED with multiple complaints including chest pain and shortness of breath  for a few weeks and dizziness x3 weeks.  She was seen at urgent care and they sent her to the ED.   Her chest pain typically happens at night when she is lying down. She states it's not a pain, but more a tightness. This has been going on for a couple weeks as well. Nothing makes it better or worse. She also has shortness of breath with exertion, no cough.   She tells me her dizziness started mid November, but she has a history of intermittent vertigo. She also had a headache. Her PCP checked her ear and told her she had fluid in her ear. Her dizziness continued despite mucinex and meclizine. Movement makes it worse. If she is lying still she is fine. If she turns her head she is dizzy. She states this feels different than her usual vertigo. She feels this is more like her balance is off. She states she has to get her bearings when she first stands up. She doesn't feel dizzy, just feels like she has lost her balance. She has no tinnitus, but does have pulsatile tinnitus. She has had vision changes in her right eye that has been going on and she hasn't been able to see her doctor. It feels like something is under her eyelid and her vision is blurry. She also has been having migraines on the right side of her head, around her eye. She has photophobia with this.   Denies any fever/chills, cough, abdominal pain, N/V/D, dysuria or leg swelling.    Smokes 1/2 PPD. Does not  drink any alcohol.   ER Course:  vitals: afebrile, bp: 185/61, HR: 63, RR: 18, oxygen 95%RA Pertinent labs: BUN: 27, creatinine: 2.20,  (2.2-2.5), troponin 30>30, d-dimer .63,  CXR: no acute finding CT head: chronic infarcts. Advanced chronic ischemic disease. No acute intracranial abnormality.  In ED: started on heparin gtt, given hydralazine x1 dose and [facility] asked to admit.      Review of Systems: As mentioned in the history of present illness. All other systems reviewed and are negative. 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,  "small"   Anxiety    Arthritis    Cataract    CKD (chronic kidney disease), stage IV ([place])    followed by [facility]   Colon polyps    Diabetes mellitus 1998   Diverticulosis    Emphysema of lung ([place])    "no one has evry told me that"   ESOPHAGEAL STRICTURE 08/27/2008   Family history of adverse reaction to anesthesia    daughter n/v   GERD (gastroesophageal reflux disease)    Hemorrhoids    Hiatal hernia    Hyperlipidemia    Hypertension 2000   Migraines    Neuropathy    fingers and toe   Peripheral vascular disease (HCC)    Phlebitis  30 years ago  left leg   Stroke [facility])    multiple mini strokes ( brain aneurysm ).  Right side weaker.   Past Surgical History:  Procedure Laterality Date   ABDOMINAL AORTAGRAM N/A 01/04/2012   Procedure: ABDOMINAL AORTAGRAM;  Surgeon: Serafina [place], MD;  Location: [facility] CATH LAB;  Service: Cardiovascular;  Laterality: N/A;   ABDOMINAL AORTAGRAM N/A 08/15/2012   Procedure: ABDOMINAL Maxcine Ham;  Surgeon: Serafina [place], MD;  Location: [facility] C[facility] LAB;  Service: Cardiovascular;  Laterality: N/A;   ABDOMINAL AORTOGRAM W/LOWER EXTREMITY Left 11/17/2021   Procedure: ABDOMINAL AORTOGRAM W/LOWER EXTREMITY;  Surgeon: Serafina [place], MD;  Location: [place] CV LAB;  Service: Cardiovascular;  Laterality: Left;    ABDOMINAL AORTOGRAM W/LOWER EXTREMITY N/A 03/09/2022   Procedure: ABDOMINAL AORTOGRAM W/LOWER EXTREMITY;  Surgeon: Serafina [place], MD;  Location: [place] CV LAB;  Service: Cardiovascular;  Laterality: N/A;   ABDOMINAL HYSTERECTOMY  1984   cataract surgery Right 10-22-2015   cataract surgery Left 11-05-2015   CESAREAN SECTION     CHOLECYSTECTOMY     COLONOSCOPY  07/2010   DENTAL SURGERY  Aug. 16, 2013   left lower    ESOPHAGOGASTRODUODENOSCOPY  07/2010   EYE SURGERY  Nov. 2014   Laser-Glaucoma   FEMORAL ARTERY STENT  05/11/11   Left superficial femoral and popliteal artery   FEMORAL-POPLITEAL BYPASS GRAFT Left 11/18/2021   Procedure: LEFT FEMORAL-POPLITEAL ARTERY BYPASS GRAFT;  Surgeon: Serafina [place], MD;  Location: [place];  Service: Vascular;  Laterality: Left;   HERNIA REPAIR     times two   KNEE SURGERY     LOWER EXTREMITY ANGIOGRAM Left 08/15/2012   Procedure: LOWER EXTREMITY ANGIOGRAM;  Surgeon: Serafina [place], MD;  Location[facility] C[facility] LAB;  Service: Cardiovascular;  Laterality: Left;  lt leg angio   PERIPHERAL VASCULAR BALLOON ANGIOPLASTY Left 03/09/2022   Procedure: PERIPHERAL VASCULAR BALLOON ANGIOPLASTY;  Surgeon: Serafina [place], MD;  Location: [place] CV LAB;  Service: Cardiovascular;  Laterality: Left;   rotator cuff surgery     THYROID SURGERY     Social History:  reports that she has been smoking cigarettes. She has a 16.00 pack-year smoking history. She has never used smokeless tobacco. She reports that she does not drink alcohol and does not use drugs.  Allergies  Allergen Reactions   Ciprofloxacin Swelling   Ozempic (0.25 Or 0.5 Mg-Dose) [Semaglutide(0.25 Or 0.14m-Dos)] Nausea And Vomiting   Plavix [Clopidogrel Bisulfate] Palpitations   Amoxicillin Itching and Swelling    FACE & EYES SWELL   Ace Inhibitors Cough    Sore throat   Atorvastatin     myalgias   Lyrica [Pregabalin] Itching and Nausea Only     gain weight   Penicillins Other (See Comments)     Pt unsure if there is an allergic reaction     Family History  Problem Relation Age of Onset   CAD Mother [phone]      Died of MI   Hypertension Mother    Heart attack Mother    Heart disease Mother    CAD Father [phone]      Died of MI   Heart disease Father    Heart attack Father    CAD Brother 655      Two brothers died of MI   Heart attack Brother    Heart disease Brother        Amputation   Diabetes Sister  Hypertension Sister    Heart attack Brother    Heart disease Brother    Stroke Sister    Colon cancer Neg Hx    Stomach cancer Neg Hx    Esophageal cancer Neg Hx     Prior to Admission medications   Medication Sig Start Date End Date Taking? Authorizing Provider  Acetaminophen (TYLENOL PO) Take by mouth.    [provider]  amLODipine (NORVASC) 10 MG tablet Take 1 tablet (10 mg total) by mouth daily. for blood pressure 04/15/22   Alycia Rossetti, NP  aspirin 325 MG EC tablet Take 325 mg by mouth at bedtime.    [provider]  b complex vitamins capsule Take 2 capsules by mouth daily.    [provider]  Blood Glucose Monitoring Suppl (ONETOUCH VERIO FLEX SYSTEM) w/Device KIT USE AS ADVISED 07/20/21   Philemon Kingdom, MD  busPIRone (BUSPAR) 5 MG tablet TAKE 1 TABLET BY MOUTH THREE TIMES A DAY 08/26/22   Alycia Rossetti, NP  citalopram (CELEXA) 40 MG tablet TAKE 1 TABLET BY MOUTH EVERY DAY IN THE MORNING 08/05/22   Alycia Rossetti, NP  Coenzyme Q10 (COQ10) 100 MG CAPS Take 100 mg by mouth daily.    [provider]  dapagliflozin propanediol (FARXIGA) 10 MG TABS tablet TAKE 1 TABLET BY MOUTH EVERY DAY BEFORE BREAKFAST. 05/31/22   Philemon Kingdom, MD  Ginkgo Biloba 120 MG TABS Take 120 mg by mouth daily.    [provider]  glucose blood test strip Use as instructed 3x a day - One Touch Verio 06/18/22   Philemon Kingdom, MD  insulin aspart (NOVOLOG FLEXPEN) 100 UNIT/ML FlexPen Inject 10-14 Units into the skin 3 (three)  times daily before meals. 05/31/22   Philemon Kingdom, MD  insulin degludec (TRESIBA) 100 UNIT/ML FlexTouch Pen Inject 40 Units into the skin daily. 07/20/22   Philemon Kingdom, MD  meclizine (ANTIVERT) 25 MG tablet 1/2 tab up to 3 times daily for motion sickness/dizziness. Patient taking differently: Take 12.5 mg by mouth 3 (three) times daily as needed for dizziness. 1/2 tab up to 3 times daily for motion sickness/dizziness. 11/10/21   Liane Comber, NP  olmesartan (BENICAR) 40 MG tablet Take 1 tablet (40 mg total) by mouth daily. for blood pressure 04/15/22   Alycia Rossetti, NP  Omega-3 Fatty Acids (FISH OIL) 1000 MG CAPS Take 1,000 mg by mouth every morning. 11/21/21   Dagoberto Ligas, PA-C  OneTouch Delica Lancets 47M MISC Use 3x a day 06/08/21   Philemon Kingdom, MD  OVER THE COUNTER MEDICATION Take 2 tablets by mouth 2 (two) times daily before a meal. Gluco Revolve Patient not taking: Reported on 08/04/2022    [provider]  OVER THE COUNTER MEDICATION Take 1 capsule by mouth daily. Brain Booster Patient not taking: Reported on 08/04/2022    [provider]  pantoprazole (PROTONIX) 40 MG tablet TAKE 1 TABLET BY MOUTH EVERY DAY 08/26/22   Alycia Rossetti, NP  Polyethyl Glycol-Propyl Glycol (SYSTANE ULTRA OP) Place 1 drop into both eyes daily at 12 noon. Additional drop if needed for dry eyes    [provider]  rOPINIRole (REQUIP) 0.25 MG tablet TAKE 1 TABLET BY MOUTH AT BEDTIME. 05/05/22   Alycia Rossetti, NP  rosuvastatin (CRESTOR) 40 MG tablet TAKE 1 /2 TO 1 TABLET DAILY FOR CHOLESTEROL 04/15/22   Alycia Rossetti, NP    Physical Exam: Vitals:   08/28/22 5465 08/28/22 0354 08/28/22 6568  08/28/22 0915  BP: (!) 208/72 (!) 201/65  (!) 194/67  Pulse: (!) 57   (!) 59  Resp: (!) 25   (!) 22  Temp:   98.4 F (36.9 C)   TempSrc:   Oral   SpO2: 100%   97%  Weight:      Height:       General:  Appears calm and comfortable and is in NAD Eyes:  PERRL,  EOMI, normal lids, iris ENT:  grossly normal hearing, lips & tongue, mmm; appropriate dentition. TM pearly with light reflex bilaterally. Tiny air fluid bubbles in right TM, clear.  Neck:  no LAD, masses or thyromegaly; no carotid bruits Cardiovascular:  RRR, no m/r/g. No LE edema.  Respiratory:   CTA bilaterally with no wheezes/rales/rhonchi.  Normal respiratory effort. Abdomen:  soft, NT, ND, NABS Back:   normal alignment, no CVAT Skin:  no rash or induration seen on limited exam Musculoskeletal:  grossly normal tone BUE/BLE, good ROM, no bony abnormality Lower extremity:  No LE edema.  Limited foot exam with no ulcerations.  2+ distal pulses. Psychiatric:  grossly normal mood and affect, speech fluent and appropriate, AOx3 Neurologic:  CN 2-12 grossly intact, moves all extremities in coordinated fashion, sensation intact. No nystagmus.    Radiological Exams on Admission: Independently reviewed - see discussion in A/P where applicable  CT Head Wo Contrast  Result Date: 08/28/2022 CLINICAL DATA:  76 year old female with dizziness. Persistent daily headache. Chest pain and shortness of breath. EXAM: CT HEAD WITHOUT CONTRAST TECHNIQUE: Contiguous axial images were obtained from the base of the skull through the vertex without intravenous contrast. RADIATION DOSE REDUCTION: This exam was performed according to the departmental dose-optimization program which includes automated exposure control, adjustment of the mA and/or kV according to patient size and/or use of iterative reconstruction technique. COMPARISON:  Brain MRI 11/22/2020.  Head CT 10/28/2020. FINDINGS: Brain: Scattered chronic infarcts. Moderate size chronic right PICA cerebellar infarct. Smaller left cerebellar hemisphere infarct. Chronic encephalomalacia in the posterior left MCA/PCA watershed area, inferior parietal lobe. Associated mild asymmetric left corona radiata hypodensity, and heterogeneity in the left thalamus also. No  midline shift, ventriculomegaly, mass effect, evidence of mass lesion, intracranial hemorrhage or evidence of cortically based acute infarction. Vascular: Calcified atherosclerosis at the skull base. No suspicious intracranial vascular hyperdensity. Skull: Stable, negative. Sinuses/Orbits: Tympanic cavities, Visualized paranasal sinuses and mastoids are clear. Other: Stable, negative orbits and scalp. IMPRESSION: Advanced chronic ischemic disease. No acute intracranial abnormality. Electronically Signed   By: Genevie Ann M.D.   On: 08/28/2022 05:38   DG Chest 2 View  Result Date: 08/27/2022 CLINICAL DATA:  Chest pain and shortness of breath, initial encounter EXAM: CHEST - 2 VIEW COMPARISON:  10/16/2020 CT FINDINGS: Cardiac shadow is within normal limits. Aortic calcifications are seen. Calcified granuloma is noted in the lateral aspect of the right lower lobe stable from the prior exam. No focal infiltrate or effusion is seen. IMPRESSION: Changes of prior granulomatous disease. No acute abnormality noted. Electronically Signed   By: Inez Catalina M.D.   On: 08/27/2022 19:49    EKG: Independently reviewed.  NSR with rate 63; nonspecific ST changes with no evidence of acute ischemia   Labs on Admission: I have personally reviewed the available labs and imaging studies at the time of the admission.  Pertinent labs:   BUN: 27 creatinine: 2.20,  (2.2-2.5),  troponin 30>30, d-dimer .79,   Assessment and Plan: Principal Problem:   Chest pain Active Problems:  Hypertensive urgency with chest pain and shortness of breath   Dizziness   CKD stage 4 due to type 2 diabetes mellitus (HCC)   Type 2 diabetes mellitus with circulatory disorder, with long-term current use of insulin (HCC)   History of CVA (cerebrovascular accident)   Hyperlipidemia associated with type 2 diabetes mellitus ([place])   Peripheral vascular disease due to secondary diabetes mellitus ([place])   Generalized anxiety disorder   Esophageal  reflux   Blurry vision, right eye    Assessment and Plan: Hypertensive urgency with chest pain and shortness of breath 76 year old female presenting with multiple complaints including chest tightness, shortness of breath, dizziness x 3+ weeks with presenting blood pressures >536 systolic.  -obs to progressive -CT head no acute findings, CXR clear and troponin flat at 30 with no EKG changes -given 42m of hydralazine only BP now 180-190. Start back home medication of norvasc 150mand benicar 4028mPRN hydralazine. If blood pressure not controlled despite this and having continued chest pain will start nitro gtt -history of orthostatic hypotension and nephrology recently stopped one of her bp medications. Holding farxiga with this history  -check brain MRI with history of CVA/off balance sensation. Have ordered this stat as do not want to drop her pressures too much if she has had a small CVA.  -echo  -heparin gtt started in ED for possible PE; however, she has no hypoxia, dyspnea, leg swelling. D-dimer is wnl for age adjustment and her Wells criteria score for PE is zero. Will stop heparin.  -continue celexa/buspar, has had increased anxiety which could also be contributing to her symtpoms  Dizziness History of vertigo, but this is different and is not so much dizziness as gait disturbance and feeling "off balance" MRI stat pending, CT head negative HTN urgency: blood pressure control. Will need to be conservative as was having orthostatic hypotension and one agent recently stopped by her nephrologist.  Orthostatic vitals-hold farxiga  She also has pulsatile tinnitus with h/a. Consider ICH/optho consult for vision field and pressure check after MRI if symptoms not better with blood pressure control.   CKD stage 4 due to type 2 diabetes mellitus (HCC) Baseline creatinine 2.2-2.5 Stable Strict I/O   Type 2 diabetes mellitus with circulatory disorder, with long-term current use of insulin  (HCC) Recent A1C 7.6 Continue farxiga and long acting insulin tresiba at 40 units SSI and accuchecks qac/hs   History of CVA (cerebrovascular accident) CT with no acute findings With history of CVA/dizziness and HTN urgency will check MRI  Allergy to plavix Continue ASA 325m13md statin   Hyperlipidemia associated with type 2 diabetes mellitus (HCC)[place] high intensity statin   Peripheral vascular disease due to secondary diabetes mellitus (HCC)[place] has history of left SFA stenting in 2012. In-stent stenosis was treated with balloon angioplasty however ultimately failed. She then required left femoral to below the knee popliteal bypass with vein by Dr. BrabTrula Slade3/09/2021.  Underwent angiogram in 02/2022 with intervention: Distal left femoral-popliteal bypass graft stenosis successfully treated using a 4 mm drug-coated balloon  Follows with vascular  Continue high intensity statin and ASA   Generalized anxiety disorder Continue celexa 40mg3mly and buspar PRN TID   Esophageal reflux Continue protonix daily   Blurry vision, right eye Has been having blurry vision in her right eye for months. Recommend ophthalmology visit     Advance Care Planning:   Code Status: Full Code   Consults: none   DVT Prophylaxis:  heparin [place]  Family Communication: husband at bedside. Updated daughters by phone.   Severity of Illness: The appropriate patient status for this patient is OBSERVATION. Observation status is judged to be reasonable and necessary in order to provide the required intensity of service to ensure the patient's safety. The patient's presenting symptoms, physical exam findings, and initial radiographic and laboratory data in the context of their medical condition is felt to place them at decreased risk for further clinical deterioration. Furthermore, it is anticipated that the patient will be medically stable for discharge from the hospital within 2 midnights of admission.    Author: Orma Flaming, MD 08/28/2022 9:36 AM  For on call review [email or web address].

## 2022-08-28 NOTE — Assessment & Plan Note (Addendum)
76 year old female presenting with multiple complaints including chest tightness, shortness of breath, dizziness x 3+ weeks with presenting blood pressures >683 systolic.  -obs to progressive -CT head no acute findings, CXR clear and troponin flat at 30 with no EKG changes -given '2mg'$  of hydralazine only BP now 180-190. Start back home medication of norvasc '10mg'$  and benicar '40mg'$ . PRN hydralazine. If blood pressure not controlled despite this and having continued chest pain will start nitro gtt -history of orthostatic hypotension and nephrology recently stopped one of her bp medications. Holding farxiga with this history  -check brain MRI with history of CVA/off balance sensation. Have ordered this stat as do not want to drop her pressures too much if she has had a small CVA.  -echo  -heparin gtt started in ED for possible PE; however, she has no hypoxia, dyspnea, leg swelling. D-dimer is wnl for age adjustment and her Wells criteria score for PE is zero. Will stop heparin.  -continue celexa/buspar, has had increased anxiety which could also be contributing to her symtpoms

## 2022-08-28 NOTE — Assessment & Plan Note (Signed)
Continue protonix daily. 

## 2022-08-28 NOTE — Assessment & Plan Note (Signed)
CT with no acute findings With history of CVA/dizziness and HTN urgency. -MRI brain done with no acute abnormalities, advanced chronic ischemic disease appears stable from an MRI last year.  -Patient improved clinically.   Allergy to plavix Patient was maintained on home regimen ASA '325mg'$  and statin

## 2022-08-28 NOTE — Progress Notes (Signed)
ANTICOAGULATION CONSULT NOTE - Initial Consult  Pharmacy Consult for Heparin Indication: rule out pulmonary embolus  Allergies  Allergen Reactions   Ciprofloxacin Swelling   Ozempic (0.25 Or 0.5 Mg-Dose) [Semaglutide(0.25 Or 0.'5mg'$ -Dos)] Nausea And Vomiting   Plavix [Clopidogrel Bisulfate] Palpitations   Amoxicillin Itching and Swelling    FACE & EYES SWELL   Ace Inhibitors Cough    Sore throat   Atorvastatin     myalgias   Lyrica [Pregabalin] Itching and Nausea Only     gain weight   Penicillins Other (See Comments)    Pt unsure if there is an allergic reaction     Patient Measurements: Height: '5\' 6"'$  (167.6 cm) Weight: 84.8 kg (187 lb) IBW/kg (Calculated) : 59.3 Heparin Dosing Weight: 77kg  Vital Signs: Temp: 97.9 F (36.6 C) (12/09 0507) Temp Source: Oral (12/09 0507) BP: 208/72 (12/09 0535) Pulse Rate: 57 (12/09 0535)  Labs: Recent Labs    08/27/22 1926 08/27/22 2059  HGB 14.4  --   HCT 45.7  --   PLT 214  --   CREATININE 2.20*  --   TROPONINIHS 30* 30*    Estimated Creatinine Clearance: 24.2 mL/min (A) (by C-G formula based on SCr of 2.2 mg/dL (H)).   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,  "small"   Anxiety    Arthritis    Cataract    CKD (chronic kidney disease), stage IV ([place])    followed by [facility]   Colon polyps    Diabetes mellitus 1998   Diverticulosis    Emphysema of lung ([place])    "no one has evry told me that"   ESOPHAGEAL STRICTURE 08/27/2008   Family history of adverse reaction to anesthesia    daughter n/v   GERD (gastroesophageal reflux disease)    Hemorrhoids    Hiatal hernia    Hyperlipidemia    Hypertension 2000   Migraines    Neuropathy    fingers and toe   Peripheral vascular disease ([place])    Phlebitis    30 years ago  left leg   Stroke [facility])    multiple mini strokes ( brain aneurysm ).   Right side weaker.    Medications:  Infusions:   heparin      Assessment: 76 yo F with chest pain, hypoxia and elevated DDimer.  Pharmacy consulted to dose IV heparin for possible PE.  VQ scan pending to confirm PE.  Baseline CBC WNL. Not on anticoagulation PTA.   Goal of Therapy:  Heparin level 0.3-0.7 units/ml Monitor platelets by anticoagulation protocol: Yes   Plan:  Heparin 2000 units IV bolus x1 Heparin infusion at 1200 units/hr Check heparin level 8h after infusion starts Daily heparin level & CBC while on heparin F/U VQ scan results  Netta Cedars PharmD 08/28/2022,6:15 AM

## 2022-08-28 NOTE — ED Notes (Signed)
Patient is alert and oriented.  Hx of right sided weakness/numbness due to old cva.  She reports dizziness, unsteady gait, headaches and more irritability of recent.  She has had a cold/sinus congestion.  Her MD advised her to take otc meds for same but she has not had improvement.  She has known hx of renal disease.  She denies chest pain but describes a pressure.  She has occassional cough noted.  Lungs with exp wheeze on exam.  She has hx of poor circulation and has minimal swelling noted to the left lower leg/ankle.

## 2022-08-28 NOTE — Assessment & Plan Note (Addendum)
History of vertigo, but this is different and is not so much dizziness as gait disturbance and feeling "off balance" MRI stat pending, CT head negative HTN urgency: blood pressure control. Will need to be conservative as was having orthostatic hypotension and one agent recently stopped by her nephrologist.  Orthostatic vitals-hold farxiga  She also has pulsatile tinnitus with h/a. Consider ICH/optho consult for vision field and pressure check after MRI if symptoms not better with blood pressure control.

## 2022-08-28 NOTE — ED Provider Notes (Addendum)
[facility] DEPT Provider Note   CSN: [id number] Arrival date & time: 08/27/22  1809     History  Chief Complaint  Patient presents with   Chest Pain   Shortness of Breath   Dizziness    [name] is a 76 y.o. female.  The history is provided by the patient.  Chest Pain Pain location:  Substernal area Pain radiates to:  L jaw Pain severity:  Moderate Onset quality:  Gradual Timing:  Constant Progression:  Waxing and waning Chronicity:  New Context: at rest   Relieved by:  Nothing Worsened by:  Nothing Ineffective treatments:  None tried Associated symptoms: dizziness, fatigue and shortness of breath   Associated symptoms: no cough and no diaphoresis   Shortness of Breath Severity:  Moderate Onset quality:  Gradual Timing:  Constant Progression:  Waxing and waning Chronicity:  New Context: not URI   Relieved by:  Nothing Worsened by:  Nothing Associated symptoms: chest pain   Associated symptoms: no cough, no diaphoresis and no wheezing   Dizziness Quality:  Head spinning Severity:  Moderate Onset quality:  Gradual Duration:  3 weeks Timing:  Constant Progression:  Waxing and waning Chronicity:  Recurrent Relieved by:  Nothing Worsened by:  Nothing Ineffective treatments: meclizine 1/2 in am and one full at night. Associated symptoms: chest pain and shortness of breath   Associated symptoms: no tinnitus   Risk factors: hx of vertigo   Patient with PVD, DM and HTN and vertigo presents with chest pain, SOB and vertigo.    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,  "small"   Anxiety    Arthritis    Cataract    CKD (chronic kidney disease), stage IV ([place])    followed by [facility]   Colon polyps    Diabetes mellitus 1998   Diverticulosis    Emphysema of lung ([place])    "no one has evry told me that"   ESOPHAGEAL  STRICTURE 08/27/2008   Family history of adverse reaction to anesthesia    daughter n/v   GERD (gastroesophageal reflux disease)    Hemorrhoids    Hiatal hernia    Hyperlipidemia    Hypertension 2000   Migraines    Neuropathy    fingers and toe   Peripheral vascular disease ([place])    Phlebitis    30 years ago  left leg   Stroke [facility])    multiple mini strokes ( brain aneurysm ).  Right side weaker.        Home Medications Prior to Admission medications   Medication Sig Start Date End Date Taking? Authorizing Provider  Acetaminophen (TYLENOL PO) Take by mouth.    [provider]  amLODipine (NORVASC) 10 MG tablet Take 1 tablet (10 mg total) by mouth daily. for blood pressure 04/15/22   Alycia Rossetti, NP  aspirin 325 MG EC tablet Take 325 mg by mouth at bedtime.    [provider]  b complex vitamins capsule Take 2 capsules by mouth daily.    [provider]  Blood Glucose Monitoring Suppl (ONETOUCH VERIO FLEX SYSTEM) w/Device KIT USE AS ADVISED 07/20/21   Philemon Kingdom, MD  busPIRone (BUSPAR) 5 MG tablet TAKE 1 TABLET BY MOUTH THREE TIMES A DAY 08/26/22   Alycia Rossetti, NP  citalopram (CELEXA) 40 MG tablet TAKE 1 TABLET BY MOUTH EVERY  DAY IN THE MORNING 08/05/22   Alycia Rossetti, NP  Coenzyme Q10 (COQ10) 100 MG CAPS Take 100 mg by mouth daily.    [provider]  dapagliflozin propanediol (FARXIGA) 10 MG TABS tablet TAKE 1 TABLET BY MOUTH EVERY DAY BEFORE BREAKFAST. 05/31/22   Philemon Kingdom, MD  Ginkgo Biloba 120 MG TABS Take 120 mg by mouth daily.    [provider]  glucose blood test strip Use as instructed 3x a day - One Touch Verio 06/18/22   Philemon Kingdom, MD  insulin aspart (NOVOLOG FLEXPEN) 100 UNIT/ML FlexPen Inject 10-14 Units into the skin 3 (three) times daily before meals. 05/31/22   Philemon Kingdom, MD  insulin degludec (TRESIBA) 100 UNIT/ML FlexTouch Pen Inject 40 Units into the skin daily. 07/20/22    Philemon Kingdom, MD  meclizine (ANTIVERT) 25 MG tablet 1/2 tab up to 3 times daily for motion sickness/dizziness. Patient taking differently: Take 12.5 mg by mouth 3 (three) times daily as needed for dizziness. 1/2 tab up to 3 times daily for motion sickness/dizziness. 11/10/21   Liane Comber, NP  olmesartan (BENICAR) 40 MG tablet Take 1 tablet (40 mg total) by mouth daily. for blood pressure 04/15/22   Alycia Rossetti, NP  Omega-3 Fatty Acids (FISH OIL) 1000 MG CAPS Take 1,000 mg by mouth every morning. 11/21/21   Dagoberto Ligas, PA-C  OneTouch Delica Lancets 38V MISC Use 3x a day 06/08/21   Philemon Kingdom, MD  OVER THE COUNTER MEDICATION Take 2 tablets by mouth 2 (two) times daily before a meal. Gluco Revolve Patient not taking: Reported on 08/04/2022    [provider]  OVER THE COUNTER MEDICATION Take 1 capsule by mouth daily. Brain Booster Patient not taking: Reported on 08/04/2022    [provider]  pantoprazole (PROTONIX) 40 MG tablet TAKE 1 TABLET BY MOUTH EVERY DAY 08/26/22   Alycia Rossetti, NP  Polyethyl Glycol-Propyl Glycol (SYSTANE ULTRA OP) Place 1 drop into both eyes daily at 12 noon. Additional drop if needed for dry eyes    [provider]  rOPINIRole (REQUIP) 0.25 MG tablet TAKE 1 TABLET BY MOUTH AT BEDTIME. 05/05/22   Alycia Rossetti, NP  rosuvastatin (CRESTOR) 40 MG tablet TAKE 1 /2 TO 1 TABLET DAILY FOR CHOLESTEROL 04/15/22   Alycia Rossetti, NP      Allergies    Ciprofloxacin, Ozempic (0.25 or 0.5 mg-dose) [semaglutide(0.25 or 0.34m-dos)], Plavix [clopidogrel bisulfate], Amoxicillin, Ace inhibitors, Atorvastatin, Lyrica [pregabalin], and Penicillins    Review of Systems   Review of Systems  Constitutional:  Positive for fatigue. Negative for diaphoresis.  HENT:  Negative for facial swelling and tinnitus.   Eyes:  Negative for redness.  Respiratory:  Positive for shortness of breath. Negative for cough and wheezing.   Cardiovascular:   Positive for chest pain.  Neurological:  Positive for dizziness. Negative for facial asymmetry.  All other systems reviewed and are negative.   Physical Exam Updated Vital Signs BP (!) 208/72   Pulse (!) 57   Temp 97.9 F (36.6 C) (Oral)   Resp (!) 25   Ht _0  (1.676 m)   Wt 84.8 kg   SpO2 100%   BMI 30.18 kg/m  Physical Exam Vitals and nursing note reviewed. Exam conducted with a chaperone present.  Constitutional:      General: She is not in acute distress.    Appearance: Normal appearance. She is well-developed. She is not diaphoretic.  HENT:  Head: Normocephalic and atraumatic.     Nose: Nose normal.  Eyes:     Pupils: Pupils are equal, round, and reactive to light.  Cardiovascular:     Rate and Rhythm: Normal rate and regular rhythm.     Pulses: Normal pulses.     Heart sounds: Normal heart sounds.  Pulmonary:     Effort: Pulmonary effort is normal. No respiratory distress.     Breath sounds: Normal breath sounds.  Abdominal:     General: Bowel sounds are normal. There is no distension.     Palpations: Abdomen is soft.     Tenderness: There is no abdominal tenderness. There is no guarding or rebound.  Genitourinary:    Vagina: No vaginal discharge.  Musculoskeletal:        General: Normal range of motion.     Cervical back: Normal range of motion and neck supple.  Skin:    General: Skin is warm and dry.     Capillary Refill: Capillary refill takes less than 2 seconds.     Findings: No erythema or rash.  Neurological:     General: No focal deficit present.     Mental Status: She is alert and oriented to person, place, and time.     Deep Tendon Reflexes: Reflexes normal.  Psychiatric:        Mood and Affect: Mood normal.        Behavior: Behavior normal.     ED Results / Procedures / Treatments   Labs (all labs ordered are listed, but only abnormal results are displayed) Results for orders placed or performed during the hospital encounter of  14/78/29  Basic metabolic panel  Result Value Ref Range   Sodium 140 135 - 145 mmol/L   Potassium 4.2 3.5 - 5.1 mmol/L   Chloride 104 98 - 111 mmol/L   CO2 29 22 - 32 mmol/L   Glucose, Bld 337 (H) 70 - 99 mg/dL   BUN 27 (H) 8 - 23 mg/dL   Creatinine, Ser 2.20 (H) 0.44 - 1.00 mg/dL   Calcium 8.6 (L) 8.9 - 10.3 mg/dL   GFR, Estimated 23 (L) >60 mL/min   Anion gap 7 5 - 15  CBC  Result Value Ref Range   WBC 9.0 4.0 - 10.5 K/uL   RBC 4.47 3.87 - 5.11 MIL/uL   Hemoglobin 14.4 12.0 - 15.0 g/dL   HCT 45.7 36.0 - 46.0 %   MCV 102.2 (H) 80.0 - 100.0 fL   MCH 32.2 26.0 - 34.0 pg   MCHC 31.5 30.0 - 36.0 g/dL   RDW 11.6 11.5 - 15.5 %   Platelets 214 150 - 400 K/uL   nRBC 0.0 0.0 - 0.2 %  Troponin I (High Sensitivity)  Result Value Ref Range   Troponin I (High Sensitivity) 30 (H) <18 ng/L  Troponin I (High Sensitivity)  Result Value Ref Range   Troponin I (High Sensitivity) 30 (H) <18 ng/L   CT Head Wo Contrast  Result Date: 08/28/2022 CLINICAL DATA:  76 year old female with dizziness. Persistent daily headache. Chest pain and shortness of breath. EXAM: CT HEAD WITHOUT CONTRAST TECHNIQUE: Contiguous axial images were obtained from the base of the skull through the vertex without intravenous contrast. RADIATION DOSE REDUCTION: This exam was performed according to the departmental dose-optimization program which includes automated exposure control, adjustment of the mA and/or kV according to patient size and/or use of iterative reconstruction technique. COMPARISON:  Brain MRI 11/22/2020.  Head CT 10/28/2020. FINDINGS:  Brain: Scattered chronic infarcts. Moderate size chronic right PICA cerebellar infarct. Smaller left cerebellar hemisphere infarct. Chronic encephalomalacia in the posterior left MCA/PCA watershed area, inferior parietal lobe. Associated mild asymmetric left corona radiata hypodensity, and heterogeneity in the left thalamus also. No midline shift, ventriculomegaly, mass effect,  evidence of mass lesion, intracranial hemorrhage or evidence of cortically based acute infarction. Vascular: Calcified atherosclerosis at the skull base. No suspicious intracranial vascular hyperdensity. Skull: Stable, negative. Sinuses/Orbits: Tympanic cavities, Visualized paranasal sinuses and mastoids are clear. Other: Stable, negative orbits and scalp. IMPRESSION: Advanced chronic ischemic disease. No acute intracranial abnormality. Electronically Signed   By: Genevie Ann M.D.   On: 08/28/2022 05:38   DG Chest 2 View  Result Date: 08/27/2022 CLINICAL DATA:  Chest pain and shortness of breath, initial encounter EXAM: CHEST - 2 VIEW COMPARISON:  10/16/2020 CT FINDINGS: Cardiac shadow is within normal limits. Aortic calcifications are seen. Calcified granuloma is noted in the lateral aspect of the right lower lobe stable from the prior exam. No focal infiltrate or effusion is seen. IMPRESSION: Changes of prior granulomatous disease. No acute abnormality noted. Electronically Signed   By: Inez Catalina M.D.   On: 08/27/2022 19:49   VAS [place] ABI WITH/WO TBI  Result Date: 08/11/2022  LOWER EXTREMITY DOPPLER STUDY Patient Name:  ICELYNN ONKEN  Date of Exam:   08/09/2022 Medical Rec #: [id number]       Accession #:    [id number] Date of Birth: 1946-05-20        Patient Gender: F Patient Age:   40 years Exam Location:  [facility] Procedure:      VAS [place] ABI WITH/WO TBI Referring Phys: Harold Barban --------------------------------------------------------------------------------  Indications: Peripheral artery disease. High Risk Factors: Hypertension, past history of smoking, prior CVA.  Vascular Interventions: Multiple previous left lower extremity intervention,                         which were occluded. Ulceration led to Left femoral to                         below-knee popliteal artery bypass graft with                         ipsilateral translocated nonreversed saphenous vein on                          11/18/21. Comparison Study: Prior ABI 04/19/22 Performing Technologist: Elta Guadeloupe RVT, RDMS  Examination Guidelines: A complete evaluation includes at minimum, Doppler waveform signals and systolic blood pressure reading at the level of bilateral brachial, anterior tibial, and posterior tibial arteries, when vessel segments are accessible. Bilateral testing is considered an integral part of a complete examination. Photoelectric Plethysmograph (PPG) waveforms and toe systolic pressure readings are included as required and additional duplex testing as needed. Limited examinations for reoccurring indications may be performed as noted.  ABI Findings: +---------+------------------+-----+----------+--------+ Right    Rt Pressure (mmHg)IndexWaveform  Comment  +---------+------------------+-----+----------+--------+ Brachial 194                                       +---------+------------------+-----+----------+--------+ PTA      78  0.40 monophasic         +---------+------------------+-----+----------+--------+ DP       79                0.41 monophasic         +---------+------------------+-----+----------+--------+ Great Toe63                0.32 Abnormal           +---------+------------------+-----+----------+--------+ +---------+------------------+-----+-----------+-------+ Left     Lt Pressure (mmHg)IndexWaveform   Comment +---------+------------------+-----+-----------+-------+ Brachial 193                                       +---------+------------------+-----+-----------+-------+ PTA      157               0.81 multiphasic        +---------+------------------+-----+-----------+-------+ DP       152               0.78 multiphasic        +---------+------------------+-----+-----------+-------+ Great Toe103               0.53 Normal             +---------+------------------+-----+-----------+-------+  +-------+-----------+-----------+------------+------------+ ABI/TBIToday's ABIToday's TBIPrevious ABIPrevious TBI +-------+-----------+-----------+------------+------------+ Right  0.41       0.32       0.52        0.39         +-------+-----------+-----------+------------+------------+ Left   0.81       0.53       0.78        0.61         +-------+-----------+-----------+------------+------------+  Arterial wall calcification precludes accurate ankle pressures and ABIs. Bilateral ABIs and TBIs appear essentially unchanged.  Summary: Right: Resting right ankle-brachial index indicates severe right lower extremity arterial disease. The right toe-brachial index is abnormal. Left: Resting left ankle-brachial index indicates mild left lower extremity arterial disease. The left toe-brachial index is abnormal. *See table(s) above for measurements and observations.  Electronically signed by Servando Snare MD on 08/11/2022 at 10:33:57 AM.    Final    VAS [place] LOWER EXTREMITY BYPASS GRAFT DUPLEX  Result Date: 08/11/2022 LOWER EXTREMITY ARTERIAL DUPLEX STUDY Patient Name:  ZHOE CATANIA  Date of Exam:   08/09/2022 Medical Rec #: [id number]       Accession #:    [id number] Date of Birth: 23-Feb-1946        Patient Gender: F Patient Age:   71 years Exam Location:  [facility] Procedure:      VAS [place] LOWER EXTREMITY BYPASS GRAFT DUPLEX Referring Phys: Harold Barban --------------------------------------------------------------------------------  Indications: Peripheral artery disease. High Risk Factors: Hypertension, prior CVA.  Vascular Interventions: Multiple previous left lower extremity intervention,                         which were occluded. Ulceration led to Left femoral to                         below-knee popliteal artery bypass graft with                         ipsilateral translocated nonreversed saphenous vein on  11/18/21. Current ABI:            R 0.41 L 0.81  Comparison Study: 03/01/22 LT fem-pop bypass duplex Performing Technologist: Elta Guadeloupe RVT, RDMS  Examination Guidelines: A complete evaluation includes B-mode imaging, spectral Doppler, color Doppler, and power Doppler as needed of all accessible portions of each vessel. Bilateral testing is considered an integral part of a complete examination. Limited examinations for reoccurring indications may be performed as noted.   Left Graft #1: Left femoral-popliteal artery bypass +--------------------+--------+---------------+---------+--------+                     PSV cm/sStenosis       Waveform Comments +--------------------+--------+---------------+---------+--------+ Inflow              393     50-74% stenosisbiphasic          +--------------------+--------+---------------+---------+--------+ Proximal Anastomosis233     50-70% stenosistriphasic         +--------------------+--------+---------------+---------+--------+ Proximal Graft      242     50-70% stenosisbiphasic          +--------------------+--------+---------------+---------+--------+ Mid Graft           83                     biphasic          +--------------------+--------+---------------+---------+--------+ Distal Graft        173                    biphasic          +--------------------+--------+---------------+---------+--------+ Distal Anastomosis  193                    triphasic         +--------------------+--------+---------------+---------+--------+ Outflow             148                    biphasic          +--------------------+--------+---------------+---------+--------+    Summary: Left: Left femoral-popliteal artery bypass graft is patent with 50-74% stenosis in the LT CFA, 50-70% stenosis in the LT proximal graft anastomosis, and proximal graft.  See table(s) above for measurements and observations. Electronically signed by Servando Snare MD on 08/11/2022 at 10:33:37 AM.    Final     EKG EKG  Interpretation  Date/Time:  Friday August 27 2022 18:54:14 EST Ventricular Rate:  63 PR Interval:  137 QRS Duration: 95 QT Interval:  459 QTC Calculation: 470 R Axis:   44 Text Interpretation: Sinus rhythm Probable left atrial enlargement Confirmed by Randal Buba, Jaslyn Bansal ([id number]) on 08/28/2022 1:06:04 AM  Radiology CT Head Wo Contrast  Result Date: 08/28/2022 CLINICAL DATA:  76 year old female with dizziness. Persistent daily headache. Chest pain and shortness of breath. EXAM: CT HEAD WITHOUT CONTRAST TECHNIQUE: Contiguous axial images were obtained from the base of the skull through the vertex without intravenous contrast. RADIATION DOSE REDUCTION: This exam was performed according to the departmental dose-optimization program which includes automated exposure control, adjustment of the mA and/or kV according to patient size and/or use of iterative reconstruction technique. COMPARISON:  Brain MRI 11/22/2020.  Head CT 10/28/2020. FINDINGS: Brain: Scattered chronic infarcts. Moderate size chronic right PICA cerebellar infarct. Smaller left cerebellar hemisphere infarct. Chronic encephalomalacia in the posterior left MCA/PCA watershed area, inferior parietal lobe. Associated mild asymmetric left corona radiata hypodensity, and heterogeneity in the left thalamus also. No midline shift,  ventriculomegaly, mass effect, evidence of mass lesion, intracranial hemorrhage or evidence of cortically based acute infarction. Vascular: Calcified atherosclerosis at the skull base. No suspicious intracranial vascular hyperdensity. Skull: Stable, negative. Sinuses/Orbits: Tympanic cavities, Visualized paranasal sinuses and mastoids are clear. Other: Stable, negative orbits and scalp. IMPRESSION: Advanced chronic ischemic disease. No acute intracranial abnormality. Electronically Signed   By: Genevie Ann M.D.   On: 08/28/2022 05:38   DG Chest 2 View  Result Date: 08/27/2022 CLINICAL DATA:  Chest pain and shortness of breath,  initial encounter EXAM: CHEST - 2 VIEW COMPARISON:  10/16/2020 CT FINDINGS: Cardiac shadow is within normal limits. Aortic calcifications are seen. Calcified granuloma is noted in the lateral aspect of the right lower lobe stable from the prior exam. No focal infiltrate or effusion is seen. IMPRESSION: Changes of prior granulomatous disease. No acute abnormality noted. Electronically Signed   By: Inez Catalina M.D.   On: 08/27/2022 19:49    Procedures Procedures    Medications Ordered in ED Medications  hydrALAZINE (APRESOLINE) injection 2 mg (has no administration in time range)  meclizine (ANTIVERT) tablet 25 mg (25 mg Oral Given 08/28/22 0547)  acetaminophen (TYLENOL) tablet 1,000 mg (1,000 mg Oral Given 08/28/22 0547)    ED Course/ Medical Decision Making/ A&P                           Medical Decision Making Patient presents with CP, SOB, fatigue and vertigo.  Recent changes in medication   Amount and/or Complexity of Data Reviewed Independent Historian: spouse    Details: See above  External Data Reviewed: notes.    Details: Previous notes reviewed  Labs: ordered.    Details: All labs reviewed:  normal sodium 140 and potassium 4.3, elevated creatinine 2.2,  normal white count 9, normal hemoglobin 14.1, normal platelet count.  Elevated troponin 30.  Ddimer is elevated .44 Radiology: ordered and independent interpretation performed.    Details: Negative CXAR chronic small vessel disease on CT head  ECG/medicine tests: ordered and independent interpretation performed. Decision-making details documented in ED Course.  Risk OTC drugs. Prescription drug management. Decision regarding hospitalization. Risk Details: Hear score is 7 will admit for further testing and treatment.  No acute finding on head CT.  Treating BP in the ED.  Ddimer is elevated above age adjustment and given her creatinine will need VQ scan.  Given pain and hypoxia on vitals will start heparin pending VQ  Critical  Care Total time providing critical care: 30 minutes (Heparin drip )   CRITICAL CARE Performed by: Derrico Zhong K Maila Dukes-Rasch Total critical care time 30 minutes Critical care time was exclusive of separately billable procedures and treating other patients. Critical care was necessary to treat or prevent imminent or life-threatening deterioration. Critical care was time spent personally by me on the following activities: development of treatment plan with patient and/or surrogate as well as nursing, discussions with consultants, evaluation of patient's response to treatment, examination of patient, obtaining history from patient or surrogate, ordering and performing treatments and interventions, ordering and review of laboratory studies, ordering and review of radiographic studies, pulse oximetry and re-evaluation of patient's condition.  Final Clinical Impression(s) / ED Diagnoses Final diagnoses:  Chest pain, unspecified type  Vertigo  Hypertensive urgency   The patient appears reasonably stabilized for admission considering the current resources, flow, and capabilities available in the ED at this time, and I doubt any other [facility] requiring further screening and/or treatment  in the ED prior to admission.      Alieu Finnigan, MD 08/28/22 [phone]

## 2022-08-28 NOTE — Assessment & Plan Note (Addendum)
She has history of left SFA stenting in 2012. In-stent stenosis was treated with balloon angioplasty however ultimately failed. She then required left femoral to below the knee popliteal bypass with vein by Dr. Trula Slade on 11/18/2021.  Underwent angiogram in 02/2022 with intervention: Distal left femoral-popliteal bypass graft stenosis successfully treated using a 4 mm drug-coated balloon  Follows with vascular  Patient was maintained on high intensity statin and ASA

## 2022-08-28 NOTE — Progress Notes (Signed)
   08/28/22 2045  Vitals  BP (!) 194/59  MAP (mmHg) 93  BP Location Left Arm  BP Method Automatic  Patient Position (if appropriate) Lying  Pulse Rate 61  MEWS COLOR  MEWS Score Color Green  MEWS Score  MEWS Temp 0  MEWS Systolic 0  MEWS Pulse 0  MEWS RR 0  MEWS LOC 0  MEWS Score 0   Pt's BP is 194/59 (93); MD notified. PRN hydralazine given by RN. RN will continue monitor the pt.

## 2022-08-29 DIAGNOSIS — N184 Chronic kidney disease, stage 4 (severe): Secondary | ICD-10-CM

## 2022-08-29 DIAGNOSIS — F411 Generalized anxiety disorder: Secondary | ICD-10-CM

## 2022-08-29 DIAGNOSIS — Z8673 Personal history of transient ischemic attack (TIA), and cerebral infarction without residual deficits: Secondary | ICD-10-CM

## 2022-08-29 DIAGNOSIS — R42 Dizziness and giddiness: Secondary | ICD-10-CM | POA: Diagnosis present

## 2022-08-29 DIAGNOSIS — E1351 Other specified diabetes mellitus with diabetic peripheral angiopathy without gangrene: Secondary | ICD-10-CM | POA: Diagnosis not present

## 2022-08-29 DIAGNOSIS — F17210 Nicotine dependence, cigarettes, uncomplicated: Secondary | ICD-10-CM | POA: Diagnosis present

## 2022-08-29 DIAGNOSIS — I160 Hypertensive urgency: Secondary | ICD-10-CM | POA: Diagnosis present

## 2022-08-29 DIAGNOSIS — E1151 Type 2 diabetes mellitus with diabetic peripheral angiopathy without gangrene: Secondary | ICD-10-CM | POA: Diagnosis present

## 2022-08-29 DIAGNOSIS — K219 Gastro-esophageal reflux disease without esophagitis: Secondary | ICD-10-CM | POA: Diagnosis present

## 2022-08-29 DIAGNOSIS — E1142 Type 2 diabetes mellitus with diabetic polyneuropathy: Secondary | ICD-10-CM | POA: Diagnosis present

## 2022-08-29 DIAGNOSIS — Z95820 Peripheral vascular angioplasty status with implants and grafts: Secondary | ICD-10-CM | POA: Diagnosis not present

## 2022-08-29 DIAGNOSIS — E1122 Type 2 diabetes mellitus with diabetic chronic kidney disease: Secondary | ICD-10-CM

## 2022-08-29 DIAGNOSIS — R079 Chest pain, unspecified: Secondary | ICD-10-CM

## 2022-08-29 DIAGNOSIS — E785 Hyperlipidemia, unspecified: Secondary | ICD-10-CM | POA: Diagnosis present

## 2022-08-29 DIAGNOSIS — E1169 Type 2 diabetes mellitus with other specified complication: Secondary | ICD-10-CM | POA: Diagnosis present

## 2022-08-29 DIAGNOSIS — Z833 Family history of diabetes mellitus: Secondary | ICD-10-CM | POA: Diagnosis not present

## 2022-08-29 DIAGNOSIS — Z8249 Family history of ischemic heart disease and other diseases of the circulatory system: Secondary | ICD-10-CM | POA: Diagnosis not present

## 2022-08-29 DIAGNOSIS — I129 Hypertensive chronic kidney disease with stage 1 through stage 4 chronic kidney disease, or unspecified chronic kidney disease: Secondary | ICD-10-CM | POA: Diagnosis present

## 2022-08-29 DIAGNOSIS — R0789 Other chest pain: Secondary | ICD-10-CM | POA: Diagnosis present

## 2022-08-29 DIAGNOSIS — Z79899 Other long term (current) drug therapy: Secondary | ICD-10-CM | POA: Diagnosis not present

## 2022-08-29 DIAGNOSIS — Z794 Long term (current) use of insulin: Secondary | ICD-10-CM

## 2022-08-29 DIAGNOSIS — Z1152 Encounter for screening for COVID-19: Secondary | ICD-10-CM | POA: Diagnosis not present

## 2022-08-29 DIAGNOSIS — E1159 Type 2 diabetes mellitus with other circulatory complications: Secondary | ICD-10-CM

## 2022-08-29 DIAGNOSIS — Z888 Allergy status to other drugs, medicaments and biological substances status: Secondary | ICD-10-CM | POA: Diagnosis not present

## 2022-08-29 DIAGNOSIS — J439 Emphysema, unspecified: Secondary | ICD-10-CM | POA: Diagnosis present

## 2022-08-29 DIAGNOSIS — H93A9 Pulsatile tinnitus, unspecified ear: Secondary | ICD-10-CM | POA: Diagnosis present

## 2022-08-29 LAB — CBC
HCT: 44.5 % (ref 36.0–46.0)
Hemoglobin: 14.2 g/dL (ref 12.0–15.0)
MCH: 32.3 pg (ref 26.0–34.0)
MCHC: 31.9 g/dL (ref 30.0–36.0)
MCV: 101.4 fL — ABNORMAL HIGH (ref 80.0–100.0)
Platelets: 198 10*3/uL (ref 150–400)
RBC: 4.39 MIL/uL (ref 3.87–5.11)
RDW: 11.6 % (ref 11.5–15.5)
WBC: 8 10*3/uL (ref 4.0–10.5)
nRBC: 0 % (ref 0.0–0.2)

## 2022-08-29 LAB — BASIC METABOLIC PANEL
Anion gap: 7 (ref 5–15)
BUN: 32 mg/dL — ABNORMAL HIGH (ref 8–23)
CO2: 25 mmol/L (ref 22–32)
Calcium: 8.6 mg/dL — ABNORMAL LOW (ref 8.9–10.3)
Chloride: 111 mmol/L (ref 98–111)
Creatinine, Ser: 1.94 mg/dL — ABNORMAL HIGH (ref 0.44–1.00)
GFR, Estimated: 27 mL/min — ABNORMAL LOW (ref >60)
Glucose, Bld: 98 mg/dL (ref 70–99)
Potassium: 4.1 mmol/L (ref 3.5–5.1)
Sodium: 143 mmol/L (ref 135–145)

## 2022-08-29 LAB — GLUCOSE, CAPILLARY
Glucose-Capillary: 83 mg/dL (ref 70–99)
Glucose-Capillary: 228 mg/dL — ABNORMAL HIGH (ref 70–99)
Glucose-Capillary: 216 mg/dL — ABNORMAL HIGH (ref 70–99)
Glucose-Capillary: 190 mg/dL — ABNORMAL HIGH (ref 70–99)

## 2022-08-29 MED ORDER — HYDRALAZINE HCL 25 MG PO TABS
25.0000 mg | ORAL_TABLET | Freq: Three times a day (TID) | ORAL | Status: DC
  Administered 2022-08-29 – 2022-08-30 (×3): 25 mg via ORAL
  Filled 2022-08-29 (×3): qty 1

## 2022-08-29 NOTE — Hospital Course (Signed)
Patient 76 year old female complex medical history with history of CKD stage IV, type 2 diabetes, hypertension, hyperlipidemia, GERD, history of CVA, PVD presented to the ED with complaints of chest tightness, shortness of breath times few weeks, dizziness x 3 weeks.  Seen at the urgent care sent to the ED.  Patient seen in the ED noted to have systolic blood pressures in the 200s, head CT with chronic infarcts and no acute abnormalities.  Patient admitted for hypertensive urgency, chest pain or shortness of breath.

## 2022-08-29 NOTE — Progress Notes (Signed)
Patient stated she gets erroneously high BP when she gets her vitals checked at doctor office. RN took BP manually and will pass along to nighshift RN.

## 2022-08-29 NOTE — Progress Notes (Signed)
PROGRESS NOTE    [name]  BZJ:[id number] DOB: [birth year] DOA: 08/27/2022 PCP: [name], MD    Chief Complaint  Patient presents with   Chest Pain   Shortness of Breath   Dizziness    Brief Narrative:  Patient 76 year old female complex medical history with history of CKD stage IV, type 2 diabetes, hypertension, hyperlipidemia, GERD, history of CVA, PVD presented to the ED with complaints of chest tightness, shortness of breath times few weeks, dizziness x 3 weeks.  Seen at the urgent care sent to the ED.  Patient seen in the ED noted to have systolic blood pressures in the 200s, head CT with chronic infarcts and no acute abnormalities.  Patient admitted for hypertensive urgency, chest pain or shortness of breath.    Assessment & Plan:  Principal Problem:   Hypertensive urgency with chest pain and shortness of breath Active Problems:   Dizziness   CKD stage 4 due to type 2 diabetes mellitus (HCC)   Type 2 diabetes mellitus with circulatory disorder, with long-term current use of insulin (HCC)   History of CVA (cerebrovascular accident)   Hyperlipidemia associated with type 2 diabetes mellitus ([place])   Peripheral vascular disease due to secondary diabetes mellitus (HCC)   Generalized anxiety disorder   Esophageal reflux   Chest pain   Blurry vision, right eye    Assessment and Plan: * Hypertensive urgency with chest pain and shortness of breath 76 year old female presented with multiple complaints including chest tightness, shortness of breath, dizziness x 3+ weeks with presenting blood pressures >017 systolic.  -CT head no acute findings, CXR clear and troponin flat at 30 with no EKG changes. -2D echo with normal EF, no wall motion abnormalities, LVH. -MRI brain done with no acute abnormalities noted. -given '2mg'$  of hydralazine only in the ED with BPs in the 180s to 190s on presentation after hydralazine.   -Systolic blood pressure in the 170s this morning after  resumption of home regimen of Norvasc 10 mg daily, Avapro 300 mg daily. -history of orthostatic hypotension and nephrology recently stopped one of her bp medications.  -Holding farxiga with this history . -heparin gtt started in ED for possible PE; however, she has no hypoxia, dyspnea, leg swelling. D-dimer is wnl for age adjustment and her Wells criteria score for PE is zero. Will stop heparin.  -continue celexa/buspar, has had increased anxiety which could also be contributing to her symtpoms -Will start hydralazine 25 mg p.o. 3 times daily and will need further uptitrating in the outpatient setting pending blood pressure.  Dizziness History of vertigo, but this is different and is not so much dizziness as gait disturbance and feeling "off balance" MRI negative for any acute abnormalities.   -CT head negative.  HTN urgency: blood pressure control. Will need to be conservative as was having orthostatic hypotension and one agent recently stopped by her nephrologist.  Orthostatic vitals-hold farxiga  She also has pulsatile tinnitus with h/a. Consider ICH/optho consult for vision field and pressure check after MRI if symptoms not better with blood pressure control.  -Patient with clinical improvement with symptoms. -Dizziness likely secondary to hypertensive urgency. -Outpatient follow-up.  CKD stage 4 due to type 2 diabetes mellitus (HCC) Baseline creatinine 2.2-2.5 Stable Strict I/O   Type 2 diabetes mellitus with circulatory disorder, with long-term current use of insulin (HCC) Hemoglobin A1c 7.6 (08/04/2022 ). -CBG 83 this morning.  -Continue Semglee 40 units daily, SSI.  -Hold Farxiga.    History  of CVA (cerebrovascular accident) CT with no acute findings With history of CVA/dizziness and HTN urgency. -MRI brain done with no acute abnormalities, advanced chronic ischemic disease appears stable from an MRI last year.  -Clinical improvement.   Allergy to plavix Continue ASA '325mg'$   and statin   Hyperlipidemia associated with type 2 diabetes mellitus ([place]) -Continue high intensity statin   Peripheral vascular disease due to secondary diabetes mellitus ([place]) She has history of left SFA stenting in 2012. In-stent stenosis was treated with balloon angioplasty however ultimately failed. She then required left femoral to below the knee popliteal bypass with vein by Dr. Trula Slade on 11/18/2021.  Underwent angiogram in 02/2022 with intervention: Distal left femoral-popliteal bypass graft stenosis successfully treated using a 4 mm drug-coated balloon  Follows with vascular  Continue high intensity statin and ASA   Generalized anxiety disorder - Continue BuSpar 5 mg 3 times daily, Celexa daily.   Esophageal reflux -PPI.   Blurry vision, right eye Has been having blurry vision in her right eye for months.  -Outpatient follow-up with ophthalmology.  Chest pain -Secondary to hypertensive urgency. -EKG done with no ischemic changes noted. -High-sensitivity troponin flattened. -2D echo done with EF of 60 to 65%, NWMA, moderate LVH of the inferior lateral segment, grade 1 diastolic dysfunction. -Clinical improvement with management of blood pressure. -No further inpatient cardiac workup needed at this time. -Outpatient follow-up.         DVT prophylaxis: Heparin Code Status: Full Family Communication: Updated patient, daughter at bedside. Disposition: Likely home when clinically improved with improvement with blood pressure hopefully tomorrow.  Status is: Inpatient The patient will require care spanning > 2 midnights and should be moved to inpatient because: Severity of illness   Consultants:  None  Procedures:  CT head 08/28/2022 Chest x-ray 08/27/2022 MRI brain 08/28/2022 2D echo 08/28/2022   Antimicrobials:  Anti-infectives (From admission, onward)    None         Subjective: Feeling much better overall today.  Denies any chest tightness or chest pain.   Denies any significant shortness of breath.  Denies any significant dizziness.  Stated ambulated in the room and went back and forth to the bathroom without any significant dizziness or lightheadedness.  Overall feels much better.  Daughter at bedside.  Objective: Vitals:   08/28/22 2217 08/28/22 2343 08/29/22 0408 08/29/22 1052  BP: (!) 188/63 (!) 185/58 (!) 178/63 (!) 178/76  Pulse: 66 65 (!) 59 64  Resp:  '18 18 18  '$ Temp:  99.3 F (37.4 C) 98.3 F (36.8 C) 98.3 F (36.8 C)  TempSrc:  Oral Oral Oral  SpO2:  91% 97% 96%  Weight:      Height:        Intake/Output Summary (Last 24 hours) at 08/29/2022 1535 Last data filed at 08/28/2022 2106 Gross per 24 hour  Intake 243 ml  Output --  Net 243 ml   Filed Weights   08/27/22 1848 08/28/22 1518  Weight: 84.8 kg 84.8 kg    Examination:  General exam: Appears calm and comfortable  Respiratory system: Clear to auscultation. Respiratory effort normal. Cardiovascular system: S1 & S2 heard, RRR. No JVD, murmurs, rubs, gallops or clicks.  Trace left lower extremity edema, chronic. Gastrointestinal system: Abdomen is nondistended, soft and nontender. No organomegaly or masses felt. Normal bowel sounds heard. Central nervous system: Alert and oriented. No focal neurological deficits. Extremities: Trace left lower extremity edema, chronic.  Symmetric 5 x 5 power. Skin: No rashes,  lesions or ulcers Psychiatry: Judgement and insight appear normal. Mood & affect appropriate.     Data Reviewed:   CBC: Recent Labs  Lab 08/27/22 1926 08/29/22 0516  WBC 9.0 8.0  HGB 14.4 14.2  HCT 45.7 44.5  MCV 102.2* 101.4*  PLT 214 829    Basic Metabolic Panel: Recent Labs  Lab 08/27/22 1926 08/28/22 0926 08/29/22 0516  NA 140 140 143  K 4.2 4.2 4.1  CL 104 107 111  CO2 '29 27 25  '$ GLUCOSE 337* 146* 98  BUN 27* 27* 32*  CREATININE 2.20* 2.06* 1.94*  CALCIUM 8.6* 8.7* 8.6*    GFR: Estimated Creatinine Clearance: 27.5 mL/min (A) (by  C-G formula based on SCr of 1.94 mg/dL (H)).  Liver Function Tests: No results for input(s): "AST", "ALT", "ALKPHOS", "BILITOT", "PROT", "ALBUMIN" in the last 168 hours.  CBG: Recent Labs  Lab 08/28/22 1210 08/28/22 1703 08/28/22 2033 08/29/22 0745 08/29/22 1107  GLUCAP 169* 123* 263* 83 228*     Recent Results (from the past 240 hour(s))  Resp Panel by RT-PCR (Flu A&B, Covid) Anterior Nasal Swab     Status: None   Collection Time: 08/28/22  6:13 AM   Specimen: Anterior Nasal Swab  Result Value Ref Range Status   SARS Coronavirus 2 by RT PCR NEGATIVE NEGATIVE Final    Comment: (NOTE) SARS-CoV-2 target nucleic acids are NOT DETECTED.  The SARS-CoV-2 RNA is generally detectable in upper respiratory specimens during the acute phase of infection. The lowest concentration of SARS-CoV-2 viral copies this assay can detect is 138 copies/mL. A negative result does not preclude SARS-Cov-2 infection and should not be used as the sole basis for treatment or other patient management decisions. A negative result may occur with  improper specimen collection/handling, submission of specimen other than nasopharyngeal swab, presence of viral mutation(s) within the areas targeted by this assay, and inadequate number of viral copies(<138 copies/mL). A negative result must be combined with clinical observations, patient history, and epidemiological information. The expected result is Negative.  Fact Sheet for Patients:  EntrepreneurPulse.com.au  Fact Sheet for Healthcare Providers:  IncredibleEmployment.be  This test is no t yet approved or cleared by the [place] FDA and  has been authorized for detection and/or diagnosis of SARS-CoV-2 by FDA under an Emergency Use Authorization (EUA). This EUA will remain  in effect (meaning this test can be used) for the duration of the COVID-19 declaration under Section 564(b)(1) of the Act, 21 U.S.C.section  360bbb-3(b)(1), unless the authorization is terminated  or revoked sooner.       Influenza A by PCR NEGATIVE NEGATIVE Final   Influenza B by PCR NEGATIVE NEGATIVE Final    Comment: (NOTE) The Xpert Xpress SARS-CoV-2/FLU/RSV plus assay is intended as an aid in the diagnosis of influenza from Nasopharyngeal swab specimens and should not be used as a sole basis for treatment. Nasal washings and aspirates are unacceptable for Xpert Xpress SARS-CoV-2/FLU/RSV testing.  Fact Sheet for Patients: EntrepreneurPulse.com.au  Fact Sheet for Healthcare Providers: IncredibleEmployment.be  This test is not yet approved or cleared by the [place] FDA and has been authorized for detection and/or diagnosis of SARS-CoV-2 by FDA under an Emergency Use Authorization (EUA). This EUA will remain in effect (meaning this test can be used) for the duration of the COVID-19 declaration under Section 564(b)(1) of the Act, 21 U.S.C. section 360bbb-3(b)(1), unless the authorization is terminated or revoked.  Performed at [facility], [address] [place], [place]  [address]          Radiology Studies: ECHOCARDIOGRAM COMPLETE  Result Date: 08/28/2022    ECHOCARDIOGRAM REPORT   Patient Name:   [name] Date of Exam: 08/28/2022 Medical Rec #:  [id number]      Height:       66.0 in Accession #:    [id number]     Weight:       187.0 lb Date of Birth:  [birth year]       BSA:          1.943 m Patient Age:    57 years       BP:           179/63 mmHg Patient Gender: F              HR:           59 bpm. Exam Location:  Inpatient Procedure: 2D Echo, Color Doppler and Cardiac Doppler Indications:    Dyspnea  History:        Patient has prior history of Echocardiogram examinations, most                 recent 01/26/2017. Stroke and PVD; Risk Factors:Dyslipidemia,                 Diabetes and Hypertension. CKD.  Referring Phys: [id number] [facility]   Sonographer Comments: Suboptimal parasternal window and suboptimal apical window. Image acquisition challenging due to patient body habitus and Image acquisition challenging due to respiratory motion. IMPRESSIONS  1. Left ventricular ejection fraction, by estimation, is 60 to 65%. The left ventricle has normal function. The left ventricle has no regional wall motion abnormalities. There is moderate left ventricular hypertrophy of the infero-lateral segment. Left ventricular diastolic parameters are consistent with Grade I diastolic dysfunction (impaired relaxation). Elevated left ventricular end-diastolic pressure.  2. Right ventricular systolic function is normal. The right ventricular size is normal.  3. The mitral valve is normal in structure. No evidence of mitral valve regurgitation. No evidence of mitral stenosis.  4. The aortic valve is tricuspid. Aortic valve regurgitation is not visualized. Aortic valve sclerosis/calcification is present, without any evidence of aortic stenosis.  5. The inferior vena cava is normal in size with greater than 50% respiratory variability, suggesting right atrial pressure of 3 mmHg. FINDINGS  Left Ventricle: Left ventricular ejection fraction, by estimation, is 60 to 65%. The left ventricle has normal function. The left ventricle has no regional wall motion abnormalities. The left ventricular internal cavity size was normal in size. There is  moderate left ventricular hypertrophy of the infero-lateral segment. Left ventricular diastolic parameters are consistent with Grade I diastolic dysfunction (impaired relaxation). Elevated left ventricular end-diastolic pressure. Right Ventricle: The right ventricular size is normal. No increase in right ventricular wall thickness. Right ventricular systolic function is normal. Left Atrium: Left atrial size was normal in size. Right Atrium: Right atrial size was normal in size. Pericardium: There is no evidence of pericardial effusion.  Mitral Valve: The mitral valve is normal in structure. No evidence of mitral valve regurgitation. No evidence of mitral valve stenosis. MV peak gradient, 7.5 mmHg. The mean mitral valve gradient is 2.0 mmHg. Tricuspid Valve: The tricuspid valve is normal in structure. Tricuspid valve regurgitation is trivial. No evidence of tricuspid stenosis. Aortic Valve: The aortic valve is tricuspid. Aortic valve regurgitation is not visualized. Aortic valve sclerosis/calcification is present, without any evidence of aortic stenosis. Pulmonic Valve: The pulmonic valve was normal in  structure. Pulmonic valve regurgitation is not visualized. No evidence of pulmonic stenosis. Aorta: The aortic root is normal in size and structure. Venous: The inferior vena cava is normal in size with greater than 50% respiratory variability, suggesting right atrial pressure of 3 mmHg. IAS/Shunts: No atrial level shunt detected by color flow Doppler.  LEFT VENTRICLE PLAX 2D LVIDd:         4.30 cm     Diastology LVIDs:         3.30 cm     LV e' medial:    3.41 cm/s LV PW:         1.50 cm     LV E/e' medial:  19.7 LV IVS:        0.90 cm     LV e' lateral:   4.38 cm/s LVOT diam:     2.20 cm     LV E/e' lateral: 15.4 LV SV:         104 LV SV Index:   54 LVOT Area:     3.80 cm  LV Volumes (MOD) LV vol d, MOD A2C: 65.4 ml LV vol d, MOD A4C: 66.1 ml LV vol s, MOD A2C: 25.3 ml LV vol s, MOD A4C: 23.2 ml LV SV MOD A2C:     40.1 ml LV SV MOD A4C:     66.1 ml LV SV MOD BP:      43.4 ml RIGHT VENTRICLE             IVC RV S prime:     11.30 cm/s  IVC diam: 2.00 cm TAPSE (M-mode): 1.9 cm LEFT ATRIUM             Index        RIGHT ATRIUM          Index LA diam:        3.60 cm 1.85 cm/m   RA Area:     9.89 cm LA Vol (A2C):   63.4 ml 32.62 ml/m  RA Volume:   20.00 ml 10.29 ml/m LA Vol (A4C):   48.0 ml 24.70 ml/m LA Biplane Vol: 55.6 ml 28.61 ml/m  AORTIC VALVE LVOT Vmax:   116.00 cm/s LVOT Vmean:  79.700 cm/s LVOT VTI:    0.274 m  AORTA Ao Root diam: 2.80 cm  Ao Asc diam:  3.40 cm MITRAL VALVE                TRICUSPID VALVE MV Area (PHT): 2.20 cm     TR Peak grad:   7.8 mmHg MV Area VTI:   2.71 cm     TR Vmax:        140.00 cm/s MV Peak grad:  7.5 mmHg MV Mean grad:  2.0 mmHg     SHUNTS MV Vmax:       1.37 m/s     Systemic VTI:  0.27 m MV Vmean:      54.7 cm/s    Systemic Diam: 2.20 cm MV Decel Time: 345 msec MV E velocity: 67.30 cm/s MV A velocity: 116.00 cm/s MV E/A ratio:  0.58 Fransico Him MD Electronically signed by Fransico Him MD Signature Date/Time: 08/28/2022/3:25:02 PM    Final    MR BRAIN WO CONTRAST  Result Date: 08/28/2022 CLINICAL DATA:  76 year old female with chest pain and shortness of breath. Dizziness. EXAM: MRI HEAD WITHOUT CONTRAST TECHNIQUE: Multiplanar, multiecho pulse sequences of the brain and surrounding structures were obtained without intravenous contrast. COMPARISON:  Head CT 0508  hours today.  Brain MRI 11/22/2020. FINDINGS: Study is intermittently degraded by motion artifact despite repeated imaging attempts. Brain: No restricted diffusion to suggest acute infarction. No midline shift, mass effect, evidence of mass lesion, ventriculomegaly, extra-axial collection or acute intracranial hemorrhage. Cervicomedullary junction and pituitary are within normal limits. Chronic infarcts with encephalomalacia scattered in the bilateral cerebellum (more so the right), right occipital pole, left MCA/PCA watershed area, left superior frontal gyrus. No chronic cerebral blood products identified. Moderate T2 and FLAIR heterogeneity in the pons appears stable. Chronic lacunar infarcts in the bilateral lentiform and left thalamus appears stable. Vascular: Major intracranial vascular flow voids are stable since last year. Skull and upper cervical spine: Stable. Visualized bone marrow signal is within normal limits. Negative for age visible cervical spine. Sinuses/Orbits: Stable, negative. Other: Mastoids remain well aerated. Negative visible scalp and  face. IMPRESSION: 1. Mildly motion degraded exam with no acute intracranial abnormality. 2. Advanced chronic ischemic disease appears stable from an MRI last year. Electronically Signed   By: Genevie Ann M.D.   On: 08/28/2022 12:06   CT Head Wo Contrast  Result Date: 08/28/2022 CLINICAL DATA:  76 year old female with dizziness. Persistent daily headache. Chest pain and shortness of breath. EXAM: CT HEAD WITHOUT CONTRAST TECHNIQUE: Contiguous axial images were obtained from the base of the skull through the vertex without intravenous contrast. RADIATION DOSE REDUCTION: This exam was performed according to the departmental dose-optimization program which includes automated exposure control, adjustment of the mA and/or kV according to patient size and/or use of iterative reconstruction technique. COMPARISON:  Brain MRI 11/22/2020.  Head CT 10/28/2020. FINDINGS: Brain: Scattered chronic infarcts. Moderate size chronic right PICA cerebellar infarct. Smaller left cerebellar hemisphere infarct. Chronic encephalomalacia in the posterior left MCA/PCA watershed area, inferior parietal lobe. Associated mild asymmetric left corona radiata hypodensity, and heterogeneity in the left thalamus also. No midline shift, ventriculomegaly, mass effect, evidence of mass lesion, intracranial hemorrhage or evidence of cortically based acute infarction. Vascular: Calcified atherosclerosis at the skull base. No suspicious intracranial vascular hyperdensity. Skull: Stable, negative. Sinuses/Orbits: Tympanic cavities, Visualized paranasal sinuses and mastoids are clear. Other: Stable, negative orbits and scalp. IMPRESSION: Advanced chronic ischemic disease. No acute intracranial abnormality. Electronically Signed   By: Genevie Ann M.D.   On: 08/28/2022 05:38   DG Chest 2 View  Result Date: 08/27/2022 CLINICAL DATA:  Chest pain and shortness of breath, initial encounter EXAM: CHEST - 2 VIEW COMPARISON:  10/16/2020 CT FINDINGS: Cardiac shadow is  within normal limits. Aortic calcifications are seen. Calcified granuloma is noted in the lateral aspect of the right lower lobe stable from the prior exam. No focal infiltrate or effusion is seen. IMPRESSION: Changes of prior granulomatous disease. No acute abnormality noted. Electronically Signed   By: Inez Catalina M.D.   On: 08/27/2022 19:49        Scheduled Meds:  amLODipine  10 mg Oral Daily   aspirin EC  325 mg Oral QHS   atorvastatin  80 mg Oral Daily   busPIRone  5 mg Oral TID   citalopram  40 mg Oral Daily   heparin  5,000 Units Subcutaneous Q8H   hydrALAZINE  25 mg Oral Q8H   insulin aspart  0-9 Units Subcutaneous TID WC   insulin glargine-yfgn  40 Units Subcutaneous Daily   irbesartan  300 mg Oral Daily   nicotine  14 mg Transdermal Daily   pantoprazole  40 mg Oral Daily   polyvinyl alcohol  1 drop Both Eyes  Daily   rOPINIRole  0.25 mg Oral QHS   sodium chloride flush  3 mL Intravenous Q12H   Continuous Infusions:  sodium chloride       LOS: 0 days    Time spent: 40 minutes    Irine Seal, MD [facility]   To contact the attending provider between 7A-7P or the covering provider during after hours 7P-7A, please log into the web site [email or web address] and access using universal [place] password for that web site. If you do not have the password, please call the hospital operator.  08/29/2022, 3:35 PM

## 2022-08-29 NOTE — Assessment & Plan Note (Signed)
-  Secondary to hypertensive urgency. -EKG done with no ischemic changes noted. -High-sensitivity troponin flattened. -2D echo done with EF of 60 to 65%, NWMA, moderate LVH of the inferior lateral segment, grade 1 diastolic dysfunction. -Clinical improvement with management of blood pressure. -No further inpatient cardiac workup needed at this time. -Outpatient follow-up.

## 2022-08-30 LAB — GLUCOSE, CAPILLARY
Glucose-Capillary: 115 mg/dL — ABNORMAL HIGH (ref 70–99)
Glucose-Capillary: 266 mg/dL — ABNORMAL HIGH (ref 70–99)

## 2022-08-30 LAB — BASIC METABOLIC PANEL
Anion gap: 7 (ref 5–15)
BUN: 34 mg/dL — ABNORMAL HIGH (ref 8–23)
CO2: 28 mmol/L (ref 22–32)
Calcium: 8.8 mg/dL — ABNORMAL LOW (ref 8.9–10.3)
Chloride: 106 mmol/L (ref 98–111)
Creatinine, Ser: 2.34 mg/dL — ABNORMAL HIGH (ref 0.44–1.00)
GFR, Estimated: 21 mL/min — ABNORMAL LOW (ref >60)
Glucose, Bld: 222 mg/dL — ABNORMAL HIGH (ref 70–99)
Potassium: 4.8 mmol/L (ref 3.5–5.1)
Sodium: 141 mmol/L (ref 135–145)

## 2022-08-30 MED ORDER — HYDRALAZINE HCL 50 MG PO TABS: 50.0000 mg | ORAL_TABLET | Freq: Three times a day (TID) | ORAL | 1 refills | Status: DC

## 2022-08-30 MED ORDER — HYDRALAZINE HCL 50 MG PO TABS
50.0000 mg | ORAL_TABLET | Freq: Three times a day (TID) | ORAL | Status: DC
  Administered 2022-08-30: 50 mg via ORAL
  Filled 2022-08-30: qty 1

## 2022-08-30 MED ORDER — NICOTINE 14 MG/24HR TD PT24: 14.0000 mg | MEDICATED_PATCH | Freq: Every day | TRANSDERMAL | 0 refills | Status: DC

## 2022-08-30 NOTE — Discharge Summary (Signed)
Physician Discharge Summary  [name] RPR:[id number] DOB: June 11, 1946 DOA: 08/27/2022  PCP: [name], MD  Admit date: 08/27/2022 Discharge date: 08/30/2022  Time spent: 60 minutes  Recommendations for Outpatient Follow-up:  Follow-up with [name], MD in 1 week for follow-up on hypertensive urgency.  Patient needs a basic metabolic profile done to follow-up on electrolytes and renal function.  Patient's blood pressure medication will need to be reassessed and further titration will need to be done if deemed appropriate per PCP.  Diabetes will need to be reassessed to determine whether patient may continue on Farxiga with her CKD. Follow-up with Dr. Candiss Norse, nephrology in 3 weeks.   Discharge Diagnoses:  Principal Problem:   Hypertensive urgency with chest pain and shortness of breath Active Problems:   Dizziness   CKD stage 4 due to type 2 diabetes mellitus (HCC)   Type 2 diabetes mellitus with circulatory disorder, with long-term current use of insulin (HCC)   History of CVA (cerebrovascular accident)   Hyperlipidemia associated with type 2 diabetes mellitus ([place])   Peripheral vascular disease due to secondary diabetes mellitus ([place])   Generalized anxiety disorder   Esophageal reflux   Chest pain   Blurry vision, right eye   Vertigo   Discharge Condition: Stable and improved.  Diet recommendation: Carb modified diet  Filed Weights   08/27/22 1848 08/28/22 1518  Weight: 84.8 kg 84.8 kg    History of present illness:  HPI per Dr. March Rummage is a 76 y.o. female with medical history significant of CKD stage IV, T2DM, HTN, HLD, GERD, hx of CVA, PVD who presented to ED with multiple complaints including chest pain and shortness of breath  for a few weeks and dizziness x3 weeks.  She was seen at urgent care and they sent her to the ED.    Her chest pain typically happens at night when she is lying down. She states it's not a pain, but more a tightness.  This has been going on for a couple weeks as well. Nothing makes it better or worse. She also has shortness of breath with exertion, no cough.    She tells me her dizziness started mid November, but she has a history of intermittent vertigo. She also had a headache. Her PCP checked her ear and told her she had fluid in her ear. Her dizziness continued despite mucinex and meclizine. Movement makes it worse. If she is lying still she is fine. If she turns her head she is dizzy. She states this feels different than her usual vertigo. She feels this is more like her balance is off. She states she has to get her bearings when she first stands up. She doesn't feel dizzy, just feels like she has lost her balance. She has no tinnitus, but does have pulsatile tinnitus. She has had vision changes in her right eye that has been going on and she hasn't been able to see her doctor. It feels like something is under her eyelid and her vision is blurry. She also has been having migraines on the right side of her head, around her eye. She has photophobia with this.    Denies any fever/chills, cough, abdominal pain, N/V/D, dysuria or leg swelling.      Smokes 1/2 PPD. Does not drink any alcohol.    ER Course:  vitals: afebrile, bp: 185/61, HR: 63, RR: 18, oxygen 95%RA Pertinent labs: BUN: 27, creatinine: 2.20,  (2.2-2.5), troponin 30>30, d-dimer .79,  CXR: no  acute finding CT head: chronic infarcts. Advanced chronic ischemic disease. No acute intracranial abnormality.  In ED: started on heparin gtt, given hydralazine x1 dose and TRH asked to admit.    Hospital Course:   Assessment and Plan: * Hypertensive urgency with chest pain and shortness of breath 76 year old female presented with multiple complaints including chest tightness, shortness of breath, dizziness x 3+ weeks with presenting blood pressures >161 systolic.  -CT head no acute findings, CXR clear and troponin flat at 30 with no EKG changes. -2D echo  with normal EF, no wall motion abnormalities, LVH. -MRI brain done with no acute abnormalities noted. -given 32m of hydralazine only in the ED with BPs in the 180s to 190s on presentation after hydralazine.   -Systolic blood pressure in the 170s the morning of 08/29/2022 after resumption of home regimen of Norvasc 10 mg daily, Avapro 300 mg daily. -history of orthostatic hypotension and nephrology recently stopped one of her bp medications.  -Farxiga held during the hospitalization.  -heparin gtt started in ED for possible PE; however, she had no hypoxia, dyspnea, leg swelling. D-dimer is wnl for age adjustment and her Wells criteria score for PE is zero.  Heparin subsequently discontinued per admitting physician.  -Patient was maintained on celexa/buspar, as increased anxiety which could also be contributing to her symtpoms -Patient is on hydralazine 25 mg 3 times daily in addition and dose uptitrated to 50 mg 3 times daily for better blood pressure control.   -Systolic blood pressure noted in the 200s on presentation to the ED and by day of discharge systolic blood pressures were in the 150s to 160s.   -Close outpatient follow-up with PCP for further blood pressure management and titration.   Dizziness History of vertigo, but this is different and is not so much dizziness as gait disturbance and feeling "off balance" MRI negative for any acute abnormalities.   -CT head negative.  HTN urgency: blood pressure control. Will need to be conservative as was having orthostatic hypotension and one agent recently stopped by her nephrologist.  FWilder Gladeheld during the hospitalization and will be resumed on discharge. She also has pulsatile tinnitus with h/a. Consider ICH/optho consult for vision field and pressure check after MRI if symptoms not better with blood pressure control.  -Patient improved clinically with resolution of dizziness.  -Dizziness likely secondary to hypertensive urgency. -Outpatient  follow-up.  CKD stage 4 due to type 2 diabetes mellitus ([place] Baseline creatinine 2.2-2.5 Stable Outpatient follow-up with primary nephrologist.  Type 2 diabetes mellitus with circulatory disorder, with long-term current use of insulin (HCC) Hemoglobin A1c 7.6 (08/04/2022 ). -Patient maintained on Semglee 40 units daily, SSI.   -Farxiga held during the hospitalization and will be resumed on discharge.  History of CVA (cerebrovascular accident) CT with no acute findings With history of CVA/dizziness and HTN urgency. -MRI brain done with no acute abnormalities, advanced chronic ischemic disease appears stable from an MRI last year.  -Patient improved clinically.   Allergy to plavix Patient was maintained on home regimen ASA 3253mand statin   Hyperlipidemia associated with type 2 diabetes mellitus ([place]- Patient was maintained on high intensity statin   Peripheral vascular disease due to secondary diabetes mellitus ([place] history of left SFA stenting in 2012. In-stent stenosis was treated with balloon angioplasty however ultimately failed. She then required left femoral to below the knee popliteal bypass with vein by Dr. BrTrula Sladen 11/18/2021.  Underwent angiogram in 02/2022 with intervention:  Distal left femoral-popliteal bypass graft stenosis successfully treated using a 4 mm drug-coated balloon  Follows with vascular  Patient was maintained on high intensity statin and ASA   Generalized anxiety disorder -Patient placed on BuSpar 5 mg 3 times daily, Celexa daily.   Esophageal reflux -Patient maintained on PPI.   Blurry vision, right eye Has been having blurry vision in her right eye for months.  -Outpatient follow-up with ophthalmology.  Chest pain -Secondary to hypertensive urgency. -EKG done with no ischemic changes noted. -High-sensitivity troponin flattened. -2D echo done with EF of 60 to 65%, NWMA, moderate LVH of the inferior lateral segment, grade 1 diastolic  dysfunction. -Clinical improvement with management of blood pressure and resolution of symptoms. -No further inpatient cardiac workup needed at this time. -Outpatient follow-up.        Procedures: CT head 08/28/2022 Chest x-ray 08/27/2022 MRI brain 08/28/2022 2D echo 08/28/2022  Consultations: None  Discharge Exam: Vitals:   08/30/22 1610 08/30/22 1612  BP: (!) 155/58 (!) 162/58  Pulse:    Resp:    Temp:    SpO2:      General: NAD Cardiovascular: Regular regular rate rhythm no murmurs rubs or gallops.  No JVD.  No lower extremity edema. Respiratory: Clear to auscultation bilaterally.  No wheezes, no crackles, no rhonchi.  Fair air movement.  Speaking in full sentences.  Discharge Instructions   Discharge Instructions     Diet Carb Modified   Complete by: As directed    Increase activity slowly   Complete by: As directed       Allergies as of 08/30/2022       Reactions   Ciprofloxacin Swelling   Ozempic (0.25 Or 0.5 Mg-dose) [semaglutide(0.25 Or 0.53m-dos)] Nausea And Vomiting   Plavix [clopidogrel Bisulfate] Palpitations   Amoxicillin Itching, Swelling   FACE & EYES SWELL   Ace Inhibitors Cough   Sore throat   Atorvastatin    myalgias   Lyrica [pregabalin] Itching, Nausea Only    gain weight   Penicillins Other (See Comments)   Pt unsure if there is an allergic reaction         Medication List     TAKE these medications    acetaminophen 500 MG tablet Commonly known as: TYLENOL Take 1,000 mg by mouth every 6 (six) hours as needed for mild pain.   amLODipine 10 MG tablet Commonly known as: NORVASC Take 1 tablet (10 mg total) by mouth daily. for blood pressure   aspirin EC 325 MG tablet Take 325 mg by mouth at bedtime.   b complex vitamins capsule Take 2 capsules by mouth daily.   busPIRone 5 MG tablet Commonly known as: BUSPAR TAKE 1 TABLET BY MOUTH THREE TIMES A DAY What changed:  when to take this reasons to take this   citalopram  40 MG tablet Commonly known as: CELEXA TAKE 1 TABLET BY MOUTH EVERY DAY IN THE MORNING What changed: See the new instructions.   CoQ10 100 MG Caps Take 100 mg by mouth daily.   dapagliflozin propanediol 10 MG Tabs tablet Commonly known as: Farxiga TAKE 1 TABLET BY MOUTH EVERY DAY BEFORE BREAKFAST. What changed:  how much to take how to take this when to take this additional instructions   Fish Oil 1000 MG Caps Take 1,000 mg by mouth every morning.   Ginkgo Biloba 120 MG Tabs Take 120 mg by mouth daily.   glucose blood test strip Use as instructed 3x a day - One Touch  Verio   hydrALAZINE 50 MG tablet Commonly known as: APRESOLINE Take 1 tablet (50 mg total) by mouth every 8 (eight) hours.   insulin degludec 100 UNIT/ML FlexTouch Pen Commonly known as: TRESIBA Inject 40 Units into the skin daily.   meclizine 25 MG tablet Commonly known as: ANTIVERT 1/2 tab up to 3 times daily for motion sickness/dizziness. What changed:  how much to take how to take this when to take this reasons to take this   nicotine 14 mg/24hr patch Commonly known as: NICODERM CQ - dosed in mg/24 hours Place 1 patch (14 mg total) onto the skin daily. Start taking on: August 31, 2022   NovoLOG FlexPen 100 UNIT/ML FlexPen Generic drug: insulin aspart Inject 10-14 Units into the skin 3 (three) times daily before meals.   olmesartan 40 MG tablet Commonly known as: BENICAR Take 1 tablet (40 mg total) by mouth daily. for blood pressure   OneTouch Delica Lancets 40H Misc Use 3x a day   OneTouch Verio Flex System w/Device Kit USE AS ADVISED   pantoprazole 40 MG tablet Commonly known as: PROTONIX TAKE 1 TABLET BY MOUTH EVERY DAY   rOPINIRole 0.25 MG tablet Commonly known as: REQUIP TAKE 1 TABLET BY MOUTH AT BEDTIME.   rosuvastatin 40 MG tablet Commonly known as: CRESTOR TAKE 1 /2 TO 1 TABLET DAILY FOR CHOLESTEROL What changed:  how much to take how to take this when to take  this additional instructions   SYSTANE ULTRA OP Place 1 drop into both eyes daily at 12 noon. Additional drop if needed for dry eyes       Allergies  Allergen Reactions   Ciprofloxacin Swelling   Ozempic (0.25 Or 0.5 Mg-Dose) [Semaglutide(0.25 Or 0.59m-Dos)] Nausea And Vomiting   Plavix [Clopidogrel Bisulfate] Palpitations   Amoxicillin Itching and Swelling    FACE & EYES SWELL   Ace Inhibitors Cough    Sore throat   Atorvastatin     myalgias   Lyrica [Pregabalin] Itching and Nausea Only     gain weight   Penicillins Other (See Comments)    Pt unsure if there is an allergic reaction     Follow-up Information     MUnk Pinto MD. Schedule an appointment as soon as possible for a visit in 1 week(s).   Specialty: Internal Medicine Why: f/u n hypertensive urgency Contact information: [phone]        SGean Quint MD. Schedule an appointment as soon as possible for a visit in 3 week(s).   Specialty: Nephrology Contact information: [phone]                 The results of significant diagnostics from this hospitalization (including imaging, microbiology, ancillary and laboratory) are listed below for reference.    Significant Diagnostic Studies: ECHOCARDIOGRAM COMPLETE  Result Date: 08/28/2022    ECHOCARDIOGRAM REPORT   Patient Name:   GAMNEET CENDEJASDate of Exam: 08/28/2022 Medical Rec #:  [id number]     Height:       66.0 in Accession #:    [id number]    Weight:       187.0 lb Date of Birth:  [birth year]      BSA:          1.943 m Patient Age:    89years       BP:  179/63 mmHg Patient Gender: F              HR:           59 bpm. Exam Location:  Inpatient Procedure: 2D Echo, Color Doppler and Cardiac Doppler Indications:    Dyspnea  History:        Patient has prior history of Echocardiogram examinations, most                 recent 01/26/2017. Stroke and  PVD; Risk Factors:Dyslipidemia,                 Diabetes and Hypertension. CKD.  Referring Phys: [id number] [facility]  Sonographer Comments: Suboptimal parasternal window and suboptimal apical window. Image acquisition challenging due to patient body habitus and Image acquisition challenging due to respiratory motion. IMPRESSIONS  1. Left ventricular ejection fraction, by estimation, is 60 to 65%. The left ventricle has normal function. The left ventricle has no regional wall motion abnormalities. There is moderate left ventricular hypertrophy of the infero-lateral segment. Left ventricular diastolic parameters are consistent with Grade I diastolic dysfunction (impaired relaxation). Elevated left ventricular end-diastolic pressure.  2. Right ventricular systolic function is normal. The right ventricular size is normal.  3. The mitral valve is normal in structure. No evidence of mitral valve regurgitation. No evidence of mitral stenosis.  4. The aortic valve is tricuspid. Aortic valve regurgitation is not visualized. Aortic valve sclerosis/calcification is present, without any evidence of aortic stenosis.  5. The inferior vena cava is normal in size with greater than 50% respiratory variability, suggesting right atrial pressure of 3 mmHg. FINDINGS  Left Ventricle: Left ventricular ejection fraction, by estimation, is 60 to 65%. The left ventricle has normal function. The left ventricle has no regional wall motion abnormalities. The left ventricular internal cavity size was normal in size. There is  moderate left ventricular hypertrophy of the infero-lateral segment. Left ventricular diastolic parameters are consistent with Grade I diastolic dysfunction (impaired relaxation). Elevated left ventricular end-diastolic pressure. Right Ventricle: The right ventricular size is normal. No increase in right ventricular wall thickness. Right ventricular systolic function is normal. Left Atrium: Left atrial size was normal in  size. Right Atrium: Right atrial size was normal in size. Pericardium: There is no evidence of pericardial effusion. Mitral Valve: The mitral valve is normal in structure. No evidence of mitral valve regurgitation. No evidence of mitral valve stenosis. MV peak gradient, 7.5 mmHg. The mean mitral valve gradient is 2.0 mmHg. Tricuspid Valve: The tricuspid valve is normal in structure. Tricuspid valve regurgitation is trivial. No evidence of tricuspid stenosis. Aortic Valve: The aortic valve is tricuspid. Aortic valve regurgitation is not visualized. Aortic valve sclerosis/calcification is present, without any evidence of aortic stenosis. Pulmonic Valve: The pulmonic valve was normal in structure. Pulmonic valve regurgitation is not visualized. No evidence of pulmonic stenosis. Aorta: The aortic root is normal in size and structure. Venous: The inferior vena cava is normal in size with greater than 50% respiratory variability, suggesting right atrial pressure of 3 mmHg. IAS/Shunts: No atrial level shunt detected by color flow Doppler.  LEFT VENTRICLE PLAX 2D LVIDd:         4.30 cm     Diastology LVIDs:         3.30 cm     LV e' medial:    3.41 cm/s LV PW:         1.50 cm     LV E/e' medial:  19.7 LV IVS:  0.90 cm     LV e' lateral:   4.38 cm/s LVOT diam:     2.20 cm     LV E/e' lateral: 15.4 LV SV:         104 LV SV Index:   54 LVOT Area:     3.80 cm  LV Volumes (MOD) LV vol d, MOD A2C: 65.4 ml LV vol d, MOD A4C: 66.1 ml LV vol s, MOD A2C: 25.3 ml LV vol s, MOD A4C: 23.2 ml LV SV MOD A2C:     40.1 ml LV SV MOD A4C:     66.1 ml LV SV MOD BP:      43.4 ml RIGHT VENTRICLE             IVC RV S prime:     11.30 cm/s  IVC diam: 2.00 cm TAPSE (M-mode): 1.9 cm LEFT ATRIUM             Index        RIGHT ATRIUM          Index LA diam:        3.60 cm 1.85 cm/m   RA Area:     9.89 cm LA Vol (A2C):   63.4 ml 32.62 ml/m  RA Volume:   20.00 ml 10.29 ml/m LA Vol (A4C):   48.0 ml 24.70 ml/m LA Biplane Vol: 55.6 ml 28.61  ml/m  AORTIC VALVE LVOT Vmax:   116.00 cm/s LVOT Vmean:  79.700 cm/s LVOT VTI:    0.274 m  AORTA Ao Root diam: 2.80 cm Ao Asc diam:  3.40 cm MITRAL VALVE                TRICUSPID VALVE MV Area (PHT): 2.20 cm     TR Peak grad:   7.8 mmHg MV Area VTI:   2.71 cm     TR Vmax:        140.00 cm/s MV Peak grad:  7.5 mmHg MV Mean grad:  2.0 mmHg     SHUNTS MV Vmax:       1.37 m/s     Systemic VTI:  0.27 m MV Vmean:      54.7 cm/s    Systemic Diam: 2.20 cm MV Decel Time: 345 msec MV E velocity: 67.30 cm/s MV A velocity: 116.00 cm/s MV E/A ratio:  0.58 Fransico Him MD Electronically signed by Fransico Him MD Signature Date/Time: 08/28/2022/3:25:02 PM    Final    MR BRAIN WO CONTRAST  Result Date: 08/28/2022 CLINICAL DATA:  76 year old female with chest pain and shortness of breath. Dizziness. EXAM: MRI HEAD WITHOUT CONTRAST TECHNIQUE: Multiplanar, multiecho pulse sequences of the brain and surrounding structures were obtained without intravenous contrast. COMPARISON:  Head CT 0508 hours today.  Brain MRI 11/22/2020. FINDINGS: Study is intermittently degraded by motion artifact despite repeated imaging attempts. Brain: No restricted diffusion to suggest acute infarction. No midline shift, mass effect, evidence of mass lesion, ventriculomegaly, extra-axial collection or acute intracranial hemorrhage. Cervicomedullary junction and pituitary are within normal limits. Chronic infarcts with encephalomalacia scattered in the bilateral cerebellum (more so the right), right occipital pole, left MCA/PCA watershed area, left superior frontal gyrus. No chronic cerebral blood products identified. Moderate T2 and FLAIR heterogeneity in the pons appears stable. Chronic lacunar infarcts in the bilateral lentiform and left thalamus appears stable. Vascular: Major intracranial vascular flow voids are stable since last year. Skull and upper cervical spine: Stable. Visualized bone marrow signal is within normal limits. Negative  for age  visible cervical spine. Sinuses/Orbits: Stable, negative. Other: Mastoids remain well aerated. Negative visible scalp and face. IMPRESSION: 1. Mildly motion degraded exam with no acute intracranial abnormality. 2. Advanced chronic ischemic disease appears stable from an MRI last year. Electronically Signed   By: Genevie Ann M.D.   On: 08/28/2022 12:06   CT Head Wo Contrast  Result Date: 08/28/2022 CLINICAL DATA:  76 year old female with dizziness. Persistent daily headache. Chest pain and shortness of breath. EXAM: CT HEAD WITHOUT CONTRAST TECHNIQUE: Contiguous axial images were obtained from the base of the skull through the vertex without intravenous contrast. RADIATION DOSE REDUCTION: This exam was performed according to the departmental dose-optimization program which includes automated exposure control, adjustment of the mA and/or kV according to patient size and/or use of iterative reconstruction technique. COMPARISON:  Brain MRI 11/22/2020.  Head CT 10/28/2020. FINDINGS: Brain: Scattered chronic infarcts. Moderate size chronic right PICA cerebellar infarct. Smaller left cerebellar hemisphere infarct. Chronic encephalomalacia in the posterior left MCA/PCA watershed area, inferior parietal lobe. Associated mild asymmetric left corona radiata hypodensity, and heterogeneity in the left thalamus also. No midline shift, ventriculomegaly, mass effect, evidence of mass lesion, intracranial hemorrhage or evidence of cortically based acute infarction. Vascular: Calcified atherosclerosis at the skull base. No suspicious intracranial vascular hyperdensity. Skull: Stable, negative. Sinuses/Orbits: Tympanic cavities, Visualized paranasal sinuses and mastoids are clear. Other: Stable, negative orbits and scalp. IMPRESSION: Advanced chronic ischemic disease. No acute intracranial abnormality. Electronically Signed   By: Genevie Ann M.D.   On: 08/28/2022 05:38   DG Chest 2 View  Result Date: 08/27/2022 CLINICAL DATA:  Chest  pain and shortness of breath, initial encounter EXAM: CHEST - 2 VIEW COMPARISON:  10/16/2020 CT FINDINGS: Cardiac shadow is within normal limits. Aortic calcifications are seen. Calcified granuloma is noted in the lateral aspect of the right lower lobe stable from the prior exam. No focal infiltrate or effusion is seen. IMPRESSION: Changes of prior granulomatous disease. No acute abnormality noted. Electronically Signed   By: Inez Catalina M.D.   On: 08/27/2022 19:49   VAS [place] ABI WITH/WO TBI  Result Date: 08/11/2022  LOWER EXTREMITY DOPPLER STUDY Patient Name:  [name]  Date of Exam:   08/09/2022 Medical Rec #: [id number]       Accession #:    [id number] Date of Birth: 1946-03-15        Patient Gender: F Patient Age:   26 years Exam Location:  [facility] Procedure:      VAS [place] ABI WITH/WO TBI Referring Phys: Harold Barban --------------------------------------------------------------------------------  Indications: Peripheral artery disease. High Risk Factors: Hypertension, past history of smoking, prior CVA.  Vascular Interventions: Multiple previous left lower extremity intervention,                         which were occluded. Ulceration led to Left femoral to                         below-knee popliteal artery bypass graft with                         ipsilateral translocated nonreversed saphenous vein on                         11/18/21. Comparison Study: Prior ABI 04/19/22 Performing Technologist: Elta Guadeloupe RVT, RDMS  Examination Guidelines: A  complete evaluation includes at minimum, Doppler waveform signals and systolic blood pressure reading at the level of bilateral brachial, anterior tibial, and posterior tibial arteries, when vessel segments are accessible. Bilateral testing is considered an integral part of a complete examination. Photoelectric Plethysmograph (PPG) waveforms and toe systolic pressure readings are included as required and additional duplex testing as needed.  Limited examinations for reoccurring indications may be performed as noted.  ABI Findings: +---------+------------------+-----+----------+--------+ Right    Rt Pressure (mmHg)IndexWaveform  Comment  +---------+------------------+-----+----------+--------+ Brachial 194                                       +---------+------------------+-----+----------+--------+ PTA      78                0.40 monophasic         +---------+------------------+-----+----------+--------+ DP       79                0.41 monophasic         +---------+------------------+-----+----------+--------+ Great Toe63                0.32 Abnormal           +---------+------------------+-----+----------+--------+ +---------+------------------+-----+-----------+-------+ Left     Lt Pressure (mmHg)IndexWaveform   Comment +---------+------------------+-----+-----------+-------+ Brachial 193                                       +---------+------------------+-----+-----------+-------+ PTA      157               0.81 multiphasic        +---------+------------------+-----+-----------+-------+ DP       152               0.78 multiphasic        +---------+------------------+-----+-----------+-------+ Great Toe103               0.53 Normal             +---------+------------------+-----+-----------+-------+ +-------+-----------+-----------+------------+------------+ ABI/TBIToday's ABIToday's TBIPrevious ABIPrevious TBI +-------+-----------+-----------+------------+------------+ Right  0.41       0.32       0.52        0.39         +-------+-----------+-----------+------------+------------+ Left   0.81       0.53       0.78        0.61         +-------+-----------+-----------+------------+------------+  Arterial wall calcification precludes accurate ankle pressures and ABIs. Bilateral ABIs and TBIs appear essentially unchanged.  Summary: Right: Resting right ankle-brachial index  indicates severe right lower extremity arterial disease. The right toe-brachial index is abnormal. Left: Resting left ankle-brachial index indicates mild left lower extremity arterial disease. The left toe-brachial index is abnormal. *See table(s) above for measurements and observations.  Electronically signed by Servando Snare MD on 08/11/2022 at 10:33:57 AM.    Final    VAS [place] LOWER EXTREMITY BYPASS GRAFT DUPLEX  Result Date: 08/11/2022 LOWER EXTREMITY ARTERIAL DUPLEX STUDY Patient Name:  [name]  Date of Exam:   08/09/2022 Medical Rec #: [id number]       Accession #:    [id number] Date of Birth: 14-Jan-1946        Patient Gender: F Patient Age:   46 years Exam [facility] Procedure:      VAS [place] LOWER EXTREMITY BYPASS GRAFT DUPLEX Referring Phys: Harold Barban --------------------------------------------------------------------------------  Indications: Peripheral artery disease. High Risk Factors: Hypertension, prior CVA.  Vascular Interventions: Multiple previous left lower extremity intervention,                         which were occluded. Ulceration led to Left femoral to                         below-knee popliteal artery bypass graft with                         ipsilateral translocated nonreversed saphenous vein on                         11/18/21. Current ABI:            R 0.41 L 0.81 Comparison Study: 03/01/22 LT fem-pop bypass duplex Performing Technologist: Elta Guadeloupe RVT, RDMS  Examination Guidelines: A complete evaluation includes B-mode imaging, spectral Doppler, color Doppler, and power Doppler as needed of all accessible portions of each vessel. Bilateral testing is considered an integral part of a complete examination. Limited examinations for reoccurring indications may be performed as noted.   Left Graft #1: Left femoral-popliteal artery bypass +--------------------+--------+---------------+---------+--------+                     PSV cm/sStenosis        Waveform Comments +--------------------+--------+---------------+---------+--------+ Inflow              393     50-74% stenosisbiphasic          +--------------------+--------+---------------+---------+--------+ Proximal Anastomosis233     50-70% stenosistriphasic         +--------------------+--------+---------------+---------+--------+ Proximal Graft      242     50-70% stenosisbiphasic          +--------------------+--------+---------------+---------+--------+ Mid Graft           83                     biphasic          +--------------------+--------+---------------+---------+--------+ Distal Graft        173                    biphasic          +--------------------+--------+---------------+---------+--------+ Distal Anastomosis  193                    triphasic         +--------------------+--------+---------------+---------+--------+ Outflow             148                    biphasic          +--------------------+--------+---------------+---------+--------+    Summary: Left: Left femoral-popliteal artery bypass graft is patent with 50-74% stenosis in the LT CFA, 50-70% stenosis in the LT proximal graft anastomosis, and proximal graft.  See table(s) above for measurements and observations. Electronically signed by Servando Snare MD on 08/11/2022 at 10:33:37 AM.    Final     Microbiology: Recent Results (from the past 240 hour(s))  Resp Panel by RT-PCR (Flu A&B, Covid) Anterior Nasal Swab     Status: None   Collection Time: 08/28/22  6:13 AM  Specimen: Anterior Nasal Swab  Result Value Ref Range Status   SARS Coronavirus 2 by RT PCR NEGATIVE NEGATIVE Final    Comment: (NOTE) SARS-CoV-2 target nucleic acids are NOT DETECTED.  The SARS-CoV-2 RNA is generally detectable in upper respiratory specimens during the acute phase of infection. The lowest concentration of SARS-CoV-2 viral copies this assay can detect is 138 copies/mL. A negative result does not  preclude SARS-Cov-2 infection and should not be used as the sole basis for treatment or other patient management decisions. A negative result may occur with  improper specimen collection/handling, submission of specimen other than nasopharyngeal swab, presence of viral mutation(s) within the areas targeted by this assay, and inadequate number of viral copies(<138 copies/mL). A negative result must be combined with clinical observations, patient history, and epidemiological information. The expected result is Negative.  Fact Sheet for Patients:  EntrepreneurPulse.com.au  Fact Sheet for Healthcare Providers:  IncredibleEmployment.be  This test is no t yet approved or cleared by the [place] FDA and  has been authorized for detection and/or diagnosis of SARS-CoV-2 by FDA under an Emergency Use Authorization (EUA). This EUA will remain  in effect (meaning this test can be used) for the duration of the COVID-19 declaration under Section 564(b)(1) of the Act, 21 U.S.C.section 360bbb-3(b)(1), unless the authorization is terminated  or revoked sooner.       Influenza A by PCR NEGATIVE NEGATIVE Final   Influenza B by PCR NEGATIVE NEGATIVE Final    Comment: (NOTE) The Xpert Xpress SARS-CoV-2/FLU/RSV plus assay is intended as an aid in the diagnosis of influenza from Nasopharyngeal swab specimens and should not be used as a sole basis for treatment. Nasal washings and aspirates are unacceptable for Xpert Xpress SARS-CoV-2/FLU/RSV testing.  Fact Sheet for Patients: EntrepreneurPulse.com.au  Fact Sheet for Healthcare Providers: IncredibleEmployment.be  This test is not yet approved or cleared by the [place] FDA and has been authorized for detection and/or diagnosis of SARS-CoV-2 by FDA under an Emergency Use Authorization (EUA). This EUA will remain in effect (meaning this test can be used) for the  duration of the COVID-19 declaration under Section 564(b)(1) of the Act, 21 U.S.C. section 360bbb-3(b)(1), unless the authorization is terminated or revoked.  Performed at [facility], [place] [address] [place], [place] [address]      Labs: Basic Metabolic Panel: Recent Labs  Lab 08/27/22 1926 08/28/22 0926 08/29/22 0516 08/30/22 0951  NA 140 140 143 141  K 4.2 4.2 4.1 4.8  CL 104 107 111 106  CO2 _0 GLUCOSE 337* 146* 98 222*  BUN 27* 27* 32* 34*  CREATININE 2.20* 2.06* 1.94* 2.34*  CALCIUM 8.6* 8.7* 8.6* 8.8*   Liver Function Tests: No results for input(s): "AST", "ALT", "ALKPHOS", "BILITOT", "PROT", "ALBUMIN" in the last 168 hours. No results for input(s): "LIPASE", "AMYLASE" in the last 168 hours. No results for input(s): "AMMONIA" in the last 168 hours. CBC: Recent Labs  Lab 08/27/22 1926 08/29/22 0516  WBC 9.0 8.0  HGB 14.4 14.2  HCT 45.7 44.5  MCV 102.2* 101.4*  PLT 214 198   Cardiac Enzymes: No results for input(s): "CKTOTAL", "CKMB", "CKMBINDEX", "TROPONINI" in the last 168 hours. BNP: BNP (last 3 results) Recent Labs    08/28/22 0926  BNP 75.3    ProBNP (last 3 results) No results for input(s): "PROBNP" in the last 8760 hours.  CBG: Recent Labs  Lab 08/29/22 1107 08/29/22 1608 08/29/22 2133 08/30/22 0732 08/30/22 1202  GLUCAP 228* 216* 190* 115* 266*       Signed:  Irine Seal MD.  [facility] 08/30/2022, 4:22 PM

## 2022-08-30 NOTE — Care Management Important Message (Signed)
Important Message  Patient Details IM Letter given. Name: [name] MRN: [id number] Date of Birth: 1945/10/25   Medicare Important Message Given:  Yes     Kerin Salen 08/30/2022, 2:07 PM

## 2022-09-02 ENCOUNTER — Encounter: Payer: Self-pay | Admitting: Internal Medicine

## 2022-09-06 ENCOUNTER — Encounter: Payer: Self-pay | Admitting: Internal Medicine

## 2022-09-06 ENCOUNTER — Ambulatory Visit (INDEPENDENT_AMBULATORY_CARE_PROVIDER_SITE_OTHER): Payer: Medicare Other | Admitting: Nurse Practitioner

## 2022-09-06 ENCOUNTER — Encounter: Payer: Self-pay | Admitting: Nurse Practitioner

## 2022-09-06 VITALS — BP 128/46 | HR 69 | Temp 97.1°F | Ht 66.0 in | Wt 190.8 lb

## 2022-09-06 DIAGNOSIS — E1169 Type 2 diabetes mellitus with other specified complication: Secondary | ICD-10-CM | POA: Diagnosis not present

## 2022-09-06 DIAGNOSIS — E1351 Other specified diabetes mellitus with diabetic peripheral angiopathy without gangrene: Secondary | ICD-10-CM

## 2022-09-06 DIAGNOSIS — E1159 Type 2 diabetes mellitus with other circulatory complications: Secondary | ICD-10-CM | POA: Diagnosis not present

## 2022-09-06 DIAGNOSIS — I160 Hypertensive urgency: Secondary | ICD-10-CM

## 2022-09-06 DIAGNOSIS — E1122 Type 2 diabetes mellitus with diabetic chronic kidney disease: Secondary | ICD-10-CM

## 2022-09-06 DIAGNOSIS — R079 Chest pain, unspecified: Secondary | ICD-10-CM

## 2022-09-06 DIAGNOSIS — N184 Chronic kidney disease, stage 4 (severe): Secondary | ICD-10-CM

## 2022-09-06 DIAGNOSIS — E785 Hyperlipidemia, unspecified: Secondary | ICD-10-CM

## 2022-09-06 DIAGNOSIS — Z01419 Encounter for gynecological examination (general) (routine) without abnormal findings: Secondary | ICD-10-CM | POA: Diagnosis not present

## 2022-09-06 DIAGNOSIS — Z794 Long term (current) use of insulin: Secondary | ICD-10-CM

## 2022-09-06 DIAGNOSIS — R42 Dizziness and giddiness: Secondary | ICD-10-CM

## 2022-09-06 DIAGNOSIS — K219 Gastro-esophageal reflux disease without esophagitis: Secondary | ICD-10-CM | POA: Diagnosis not present

## 2022-09-06 DIAGNOSIS — H538 Other visual disturbances: Secondary | ICD-10-CM | POA: Diagnosis not present

## 2022-09-06 DIAGNOSIS — Z8673 Personal history of transient ischemic attack (TIA), and cerebral infarction without residual deficits: Secondary | ICD-10-CM | POA: Diagnosis not present

## 2022-09-06 DIAGNOSIS — Z1231 Encounter for screening mammogram for malignant neoplasm of breast: Secondary | ICD-10-CM | POA: Diagnosis not present

## 2022-09-06 DIAGNOSIS — F411 Generalized anxiety disorder: Secondary | ICD-10-CM | POA: Diagnosis not present

## 2022-09-06 NOTE — Progress Notes (Signed)
Hospital follow up  Assessment and Plan: Hospital visit follow up for:   Hypertensive urgency with chest pain and shortness of breath Resolved  Dizziness Most likely r/t cardiac medications. Patient to confirm exact daily medications and regimen Discussed change to POC if BP and HR continue to remain low, with associated dizziness Continue to stay well hydrated.  CKD stage 4 due to type 2 diabetes mellitus (HCC) Dr. Candiss Norse in 3 weeks.  Stay well hydrated. Avoid high salt foods. Avoid NSAIDS. Keep BP and BG well controlled.   Take medications as prescribed. Remain active and exercise as tolerated daily. Maintain weight.  Continue to monitor. Check CMP/GFR/Microablumin   Type 2 diabetes mellitus with other circulatory complication, with long-term current use of insulin [facility]) Education: Reviewed 'ABCs' of diabetes management  Discussed goals to be met and/or maintained include A1C (<7) Blood pressure (<130/80) Cholesterol (LDL <70) Continue Eye Exam yearly  Continue Dental Exam Q6 mo Discussed dietary recommendations Discussed Physical Activity recommendations Check A1C   History of CVA (cerebrovascular accident) Keep BP well controlled  Continue to monitor  Hyperlipidemia associated with type 2 diabetes mellitus ([place]) Discussed lifestyle modifications. Recommended diet heavy in fruits and veggies, omega 3's. Decrease consumption of animal meats, cheeses, and dairy products. Remain active and exercise as tolerated. Continue to monitor. Check lipids/TSH   Peripheral vascular disease due to secondary diabetes mellitus (HCC) Keep BP, BG and lipids well controlled. Continue to follow with Cardiology  Generalized anxiety disorder Take Buspar 5 mg PM.  Add 1/2 tab (2.5 mg) to morning routine. If too sedating, suggested changing to Propranolol  Patient to look for counselor/psychologist  Gastroesophageal reflux disease, unspecified whether esophagitis present No  suspected reflux complications ([place]/stricture). Lifestyle modification:  wt loss, avoid meals 2-3h before bedtime. Consider eliminating food triggers:  chocolate, caffeine, EtOH, acid/spicy food.  Chest pain, unspecified type Resolved  Blurry vision, right eye Refer back to Dr. Herbert Deaner  Vertigo Possibly r/t vision issue versus dizziness with hypotension secondary to medications. Continue to monitor  Orders Placed This Encounter  Procedures   CBC with Differential/Platelet   COMPLETE METABOLIC PANEL WITH GFR    All medications were reviewed with patient and family and fully reconciled. All questions answered fully, and patient and family members were encouraged to call the office with any further questions or concerns. Discussed goal to avoid readmission related to this diagnosis.   Over 35 minutes of exam, counseling, chart review, and complex, high/moderate level critical decision making was performed this visit.   Future Appointments  Date Time Provider [place]  09/06/2022  2:30 PM Darrol Jump, NP [facility] None  09/27/2022 10:40 AM Philemon Kingdom, MD [facility] None  11/15/2022  9:00 AM [facility] HS VASC 2 - MC [facility] VVS  11/15/2022 10:00 AM [facility] HS VASC 2 - MC [facility]  11/15/2022 10:30 AM [facility]  11/22/2022  2:30 PM [name], MD [facility] None  08/08/2023  2:00 PM Alycia Rossetti, NP [facility] None     HPI 76 y.o.female presents alongside daughter who is visiting from [facility], for follow up for transition from recent hospitalization or SNIF stay. Admit date to the hospital was 08/27/22, patient was discharged from the hospital on 08/30/22 and our clinical staff contacted the office the day after discharge to set up a follow up appointment. The discharge summary, medications, and diagnostic test results were reviewed before meeting with the patient. The patient was admitted for: Hypertensitve urgerncy with CP and SOB.  Patient resented  to ED with multiple complaints including chest pain and shortness of breath  for a few weeks and dizziness x3 weeks.  She was seen at urgent care and they sent her to the ED.    Her chest pain typically happens at night when she is lying down. She states it's not a pain, but more a tightness. This has been going on for a couple weeks as well. Nothing makes it better or worse. She also has shortness of breath with exertion, no cough.   Dizziness started mid November, but she has a history of intermittent vertigo. She also had a headache. Her PCP checked her ear and told her she had fluid in her ear. Her dizziness continued despite mucinex and meclizine. Movement makes it worse. If she is lying still she is fine. If she turns her head she is dizzy. She states this feels different than her usual vertigo. She feels this is more like her balance is off. She states she has to get her bearings when she first stands up. She doesn't feel dizzy, just feels like she has lost her balance. She has no tinnitus, but does have pulsatile tinnitus. She has had vision changes in her right eye that has been going on and she hasn't been able to see her doctor. It feels like something is under her eyelid and her vision is blurry. She also has been having migraines on the right side of her head, around her eye. She has photophobia with this.    Denies any fever/chills, cough, abdominal pain, N/V/D, dysuria or leg swelling.   Continues to smoke 1/2 PPD.  CT head no acute findings.  CXR clear and troponin negative with no EKG changges.  2D Echo with  normal EF, no wall motion abnormaltities, LVH.    MRI negative.  She was treated with IV hydralazine and resumed home medications of Norvasc and Avapro however of note saw nephrology p/t ER visit and one of the medications was stopped.  Wilder Glade was held during hospitalization.  She was administered a heparin gtt for possible PE but was found to be negative. She was also  maintained on Celexa/Buspar and though CP/presentation could be exacerbated by anxiety.    She has a baseline Creatinine of 2.2-2.5 and follows with Nephrology, Dr. Candiss Norse. She plans to f/u in 3 weeks.  Last A1c 07/2022 was 7.6%.  She has since resumed her [place] Glade and will follow with Nephrology for any additional changes.   She continues on a hight intensity statin as well as ASA 325 mg  She has history of left SFA stenting in 2012. In-stent stenosis was treated with balloon angioplasty however ultimately failed. She then required left femoral to below the knee popliteal bypass with vein by Dr. Trula Slade on 11/18/2021.   Underwent angiogram in 02/2022 with intervention: Distal left femoral-popliteal bypass graft stenosis successfully treated using a 4 mm drug-coated balloon.  Follows with vascular/cardiology.  Patient was maintained on high intensity statin and ASA   She has continued Buspar 5 mg TID with Celexa.  Feels the Buspar works well but makes her too sleepy.  Unable to perform daily tasks without feeling "out of it."    Blurriness in right eye continues and has been present for months.  Suggested outpatient f/u with Ophthalmology.  Wishes to f/u with Dr. Herbert Deaner only, if possible, otherwise requesting new referral.   Overall, cardiac work up was negative.  Symptoms most likely r/t stress and anxiety.  Home  health is not involved.   Images while in the hospital: ECHOCARDIOGRAM COMPLETE  Result Date: 08/28/2022    ECHOCARDIOGRAM REPORT   Patient Name:   [name] Date of Exam: 08/28/2022 Medical Rec #:  [id number]      Height:       66.0 in Accession #:    [id number]     Weight:       187.0 lb Date of Birth:  [birth year]       BSA:          1.943 m Patient Age:    4 years       BP:           179/63 mmHg Patient Gender: F              HR:           59 bpm. Exam Location:  Inpatient Procedure: 2D Echo, Color Doppler and Cardiac Doppler Indications:    Dyspnea  History:        Patient has  prior history of Echocardiogram examinations, most                 recent 01/26/2017. Stroke and PVD; Risk Factors:Dyslipidemia,                 Diabetes and Hypertension. CKD.  Referring Phys: [id number] [facility]  Sonographer Comments: Suboptimal parasternal window and suboptimal apical window. Image acquisition challenging due to patient body habitus and Image acquisition challenging due to respiratory motion. IMPRESSIONS  1. Left ventricular ejection fraction, by estimation, is 60 to 65%. The left ventricle has normal function. The left ventricle has no regional wall motion abnormalities. There is moderate left ventricular hypertrophy of the infero-lateral segment. Left ventricular diastolic parameters are consistent with Grade I diastolic dysfunction (impaired relaxation). Elevated left ventricular end-diastolic pressure.  2. Right ventricular systolic function is normal. The right ventricular size is normal.  3. The mitral valve is normal in structure. No evidence of mitral valve regurgitation. No evidence of mitral stenosis.  4. The aortic valve is tricuspid. Aortic valve regurgitation is not visualized. Aortic valve sclerosis/calcification is present, without any evidence of aortic stenosis.  5. The inferior vena cava is normal in size with greater than 50% respiratory variability, suggesting right atrial pressure of 3 mmHg. FINDINGS  Left Ventricle: Left ventricular ejection fraction, by estimation, is 60 to 65%. The left ventricle has normal function. The left ventricle has no regional wall motion abnormalities. The left ventricular internal cavity size was normal in size. There is  moderate left ventricular hypertrophy of the infero-lateral segment. Left ventricular diastolic parameters are consistent with Grade I diastolic dysfunction (impaired relaxation). Elevated left ventricular end-diastolic pressure. Right Ventricle: The right ventricular size is normal. No increase in right ventricular wall  thickness. Right ventricular systolic function is normal. Left Atrium: Left atrial size was normal in size. Right Atrium: Right atrial size was normal in size. Pericardium: There is no evidence of pericardial effusion. Mitral Valve: The mitral valve is normal in structure. No evidence of mitral valve regurgitation. No evidence of mitral valve stenosis. MV peak gradient, 7.5 mmHg. The mean mitral valve gradient is 2.0 mmHg. Tricuspid Valve: The tricuspid valve is normal in structure. Tricuspid valve regurgitation is trivial. No evidence of tricuspid stenosis. Aortic Valve: The aortic valve is tricuspid. Aortic valve regurgitation is not visualized. Aortic valve sclerosis/calcification is present, without any evidence of aortic stenosis. Pulmonic Valve: The pulmonic valve was normal in structure.  Pulmonic valve regurgitation is not visualized. No evidence of pulmonic stenosis. Aorta: The aortic root is normal in size and structure. Venous: The inferior vena cava is normal in size with greater than 50% respiratory variability, suggesting right atrial pressure of 3 mmHg. IAS/Shunts: No atrial level shunt detected by color flow Doppler.  LEFT VENTRICLE PLAX 2D LVIDd:         4.30 cm     Diastology LVIDs:         3.30 cm     LV e' medial:    3.41 cm/s LV PW:         1.50 cm     LV E/e' medial:  19.7 LV IVS:        0.90 cm     LV e' lateral:   4.38 cm/s LVOT diam:     2.20 cm     LV E/e' lateral: 15.4 LV SV:         104 LV SV Index:   54 LVOT Area:     3.80 cm  LV Volumes (MOD) LV vol d, MOD A2C: 65.4 ml LV vol d, MOD A4C: 66.1 ml LV vol s, MOD A2C: 25.3 ml LV vol s, MOD A4C: 23.2 ml LV SV MOD A2C:     40.1 ml LV SV MOD A4C:     66.1 ml LV SV MOD BP:      43.4 ml RIGHT VENTRICLE             IVC RV S prime:     11.30 cm/s  IVC diam: 2.00 cm TAPSE (M-mode): 1.9 cm LEFT ATRIUM             Index        RIGHT ATRIUM          Index LA diam:        3.60 cm 1.85 cm/m   RA Area:     9.89 cm LA Vol (A2C):   63.4 ml 32.62 ml/m   RA Volume:   20.00 ml 10.29 ml/m LA Vol (A4C):   48.0 ml 24.70 ml/m LA Biplane Vol: 55.6 ml 28.61 ml/m  AORTIC VALVE LVOT Vmax:   116.00 cm/s LVOT Vmean:  79.700 cm/s LVOT VTI:    0.274 m  AORTA Ao Root diam: 2.80 cm Ao Asc diam:  3.40 cm MITRAL VALVE                TRICUSPID VALVE MV Area (PHT): 2.20 cm     TR Peak grad:   7.8 mmHg MV Area VTI:   2.71 cm     TR Vmax:        140.00 cm/s MV Peak grad:  7.5 mmHg MV Mean grad:  2.0 mmHg     SHUNTS MV Vmax:       1.37 m/s     Systemic VTI:  0.27 m MV Vmean:      54.7 cm/s    Systemic Diam: 2.20 cm MV Decel Time: 345 msec MV E velocity: 67.30 cm/s MV A velocity: 116.00 cm/s MV E/A ratio:  0.58 Fransico Him MD Electronically signed by Fransico Him MD Signature Date/Time: 08/28/2022/3:25:02 PM    Final    MR BRAIN WO CONTRAST  Result Date: 08/28/2022 CLINICAL DATA:  76 year old female with chest pain and shortness of breath. Dizziness. EXAM: MRI HEAD WITHOUT CONTRAST TECHNIQUE: Multiplanar, multiecho pulse sequences of the brain and surrounding structures were obtained without intravenous contrast. COMPARISON:  Head CT 0508 hours  today.  Brain MRI 11/22/2020. FINDINGS: Study is intermittently degraded by motion artifact despite repeated imaging attempts. Brain: No restricted diffusion to suggest acute infarction. No midline shift, mass effect, evidence of mass lesion, ventriculomegaly, extra-axial collection or acute intracranial hemorrhage. Cervicomedullary junction and pituitary are within normal limits. Chronic infarcts with encephalomalacia scattered in the bilateral cerebellum (more so the right), right occipital pole, left MCA/PCA watershed area, left superior frontal gyrus. No chronic cerebral blood products identified. Moderate T2 and FLAIR heterogeneity in the pons appears stable. Chronic lacunar infarcts in the bilateral lentiform and left thalamus appears stable. Vascular: Major intracranial vascular flow voids are stable since last year. Skull and upper  cervical spine: Stable. Visualized bone marrow signal is within normal limits. Negative for age visible cervical spine. Sinuses/Orbits: Stable, negative. Other: Mastoids remain well aerated. Negative visible scalp and face. IMPRESSION: 1. Mildly motion degraded exam with no acute intracranial abnormality. 2. Advanced chronic ischemic disease appears stable from an MRI last year. Electronically Signed   By: Genevie Ann M.D.   On: 08/28/2022 12:06   CT Head Wo Contrast  Result Date: 08/28/2022 CLINICAL DATA:  76 year old female with dizziness. Persistent daily headache. Chest pain and shortness of breath. EXAM: CT HEAD WITHOUT CONTRAST TECHNIQUE: Contiguous axial images were obtained from the base of the skull through the vertex without intravenous contrast. RADIATION DOSE REDUCTION: This exam was performed according to the departmental dose-optimization program which includes automated exposure control, adjustment of the mA and/or kV according to patient size and/or use of iterative reconstruction technique. COMPARISON:  Brain MRI 11/22/2020.  Head CT 10/28/2020. FINDINGS: Brain: Scattered chronic infarcts. Moderate size chronic right PICA cerebellar infarct. Smaller left cerebellar hemisphere infarct. Chronic encephalomalacia in the posterior left MCA/PCA watershed area, inferior parietal lobe. Associated mild asymmetric left corona radiata hypodensity, and heterogeneity in the left thalamus also. No midline shift, ventriculomegaly, mass effect, evidence of mass lesion, intracranial hemorrhage or evidence of cortically based acute infarction. Vascular: Calcified atherosclerosis at the skull base. No suspicious intracranial vascular hyperdensity. Skull: Stable, negative. Sinuses/Orbits: Tympanic cavities, Visualized paranasal sinuses and mastoids are clear. Other: Stable, negative orbits and scalp. IMPRESSION: Advanced chronic ischemic disease. No acute intracranial abnormality. Electronically Signed   By: Genevie Ann  M.D.   On: 08/28/2022 05:38   DG Chest 2 View  Result Date: 08/27/2022 CLINICAL DATA:  Chest pain and shortness of breath, initial encounter EXAM: CHEST - 2 VIEW COMPARISON:  10/16/2020 CT FINDINGS: Cardiac shadow is within normal limits. Aortic calcifications are seen. Calcified granuloma is noted in the lateral aspect of the right lower lobe stable from the prior exam. No focal infiltrate or effusion is seen. IMPRESSION: Changes of prior granulomatous disease. No acute abnormality noted. Electronically Signed   By: Inez Catalina M.D.   On: 08/27/2022 19:49     Current Outpatient Medications (Endocrine & Metabolic):    dapagliflozin propanediol (FARXIGA) 10 MG TABS tablet, TAKE 1 TABLET BY MOUTH EVERY DAY BEFORE BREAKFAST. (Patient taking differently: Take 10 mg by mouth daily.)   insulin aspart (NOVOLOG FLEXPEN) 100 UNIT/ML FlexPen, Inject 10-14 Units into the skin 3 (three) times daily before meals.   insulin degludec (TRESIBA) 100 UNIT/ML FlexTouch Pen, Inject 40 Units into the skin daily.  Current Outpatient Medications (Cardiovascular):    amLODipine (NORVASC) 10 MG tablet, Take 1 tablet (10 mg total) by mouth daily. for blood pressure   hydrALAZINE (APRESOLINE) 50 MG tablet, Take 1 tablet (50 mg total) by mouth every 8 (eight)  hours.   olmesartan (BENICAR) 40 MG tablet, Take 1 tablet (40 mg total) by mouth daily. for blood pressure   rosuvastatin (CRESTOR) 40 MG tablet, TAKE 1 /2 TO 1 TABLET DAILY FOR CHOLESTEROL (Patient taking differently: Take 40 mg by mouth daily.)   Current Outpatient Medications (Analgesics):    acetaminophen (TYLENOL) 500 MG tablet, Take 1,000 mg by mouth every 6 (six) hours as needed for mild pain.   aspirin 325 MG EC tablet, Take 325 mg by mouth at bedtime.   Current Outpatient Medications (Other):    b complex vitamins capsule, Take 2 capsules by mouth daily.   Blood Glucose Monitoring Suppl (ONETOUCH VERIO FLEX SYSTEM) w/Device KIT, USE AS ADVISED    busPIRone (BUSPAR) 5 MG tablet, TAKE 1 TABLET BY MOUTH THREE TIMES A DAY (Patient taking differently: Take 5 mg by mouth 3 (three) times daily as needed (anxiety).)   citalopram (CELEXA) 40 MG tablet, TAKE 1 TABLET BY MOUTH EVERY DAY IN THE MORNING (Patient taking differently: Take 40 mg by mouth daily.)   Coenzyme Q10 (COQ10) 100 MG CAPS, Take 100 mg by mouth daily.   Ginkgo Biloba 120 MG TABS, Take 120 mg by mouth daily.   glucose blood test strip, Use as instructed 3x a day - One Touch Verio   meclizine (ANTIVERT) 25 MG tablet, 1/2 tab up to 3 times daily for motion sickness/dizziness. (Patient taking differently: Take 12.5 mg by mouth 3 (three) times daily as needed for dizziness. 1/2 tab up to 3 times daily for motion sickness/dizziness.)   nicotine (NICODERM CQ - DOSED IN MG/24 HOURS) 14 mg/24hr patch, Place 1 patch (14 mg total) onto the skin daily.   Omega-3 Fatty Acids (FISH OIL) 1000 MG CAPS, Take 1,000 mg by mouth every morning.   OneTouch Delica Lancets 16X MISC, Use 3x a day   pantoprazole (PROTONIX) 40 MG tablet, TAKE 1 TABLET BY MOUTH EVERY DAY   Polyethyl Glycol-Propyl Glycol (SYSTANE ULTRA OP), Place 1 drop into both eyes daily at 12 noon. Additional drop if needed for dry eyes   rOPINIRole (REQUIP) 0.25 MG tablet, TAKE 1 TABLET BY MOUTH AT BEDTIME.  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,  "small"   Anxiety    Arthritis    Cataract    CKD (chronic kidney disease), stage IV ([place])    followed by [facility]   Colon polyps    Diabetes mellitus 1998   Diverticulosis    Emphysema of lung ([place])    "no one has evry told me that"   ESOPHAGEAL STRICTURE 08/27/2008   Family history of adverse reaction to anesthesia    daughter n/v   GERD (gastroesophageal reflux disease)    Hemorrhoids    Hiatal hernia    Hyperlipidemia    Hypertension 2000   Migraines    Neuropathy     fingers and toe   Peripheral vascular disease ([place])    Phlebitis    30 years ago  left leg   Stroke [facility])    multiple mini strokes ( brain aneurysm ).  Right side weaker.     Allergies  Allergen Reactions   Ciprofloxacin Swelling   Ozempic (0.25 Or 0.5 Mg-Dose) [Semaglutide(0.25 Or 0.51m-Dos)] Nausea And Vomiting   Plavix [Clopidogrel Bisulfate] Palpitations   Amoxicillin Itching and Swelling    FACE & EYES SWELL   Ace Inhibitors Cough  Sore throat   Atorvastatin     myalgias   Lyrica [Pregabalin] Itching and Nausea Only     gain weight   Penicillins Other (See Comments)    Pt unsure if there is an allergic reaction     ROS: all negative except above.   Physical Exam: There were no vitals filed for this visit. There were no vitals taken for this visit. General Appearance: Well nourished, in no apparent distress. Eyes: PERRLA, EOMs, conjunctiva no swelling or erythema Sinuses: No Frontal/maxillary tenderness ENT/Mouth: Ext aud canals clear, TMs without erythema, bulging. No erythema, swelling, or exudate on post pharynx.  Tonsils not swollen or erythematous. Hearing normal.  Neck: Supple, thyroid normal.  Respiratory: Respiratory effort normal, BS equal bilaterally without rales, rhonchi, wheezing or stridor.  Cardio: RRR with no MRGs. Brisk peripheral pulses without edema.  Abdomen: Soft, + BS.  Non tender, no guarding, rebound, hernias, masses. Lymphatics: Non tender without lymphadenopathy.  Musculoskeletal: Full ROM, 5/5 strength, normal gait.  Skin: Warm, dry without rashes, lesions, ecchymosis.  Neuro: Cranial nerves intact. Normal muscle tone, no cerebellar symptoms. Sensation intact.  Psych: Awake and oriented X 3, normal affect, Insight and Judgment appropriate.     Darrol Jump, NP 12:36 PM [facility]

## 2022-09-06 NOTE — Patient Instructions (Signed)
Take Buspar 5 mg every night Take Buspar 2.5 mg in the AM if feeling anxious (or by lunch).  If not working or making you too sleepy will start Propranolol.  Generalized Anxiety Disorder, Adult Generalized anxiety disorder (GAD) is a mental health condition. Unlike normal worries, anxiety related to GAD is not triggered by a specific event. These worries do not fade or get better with time. GAD interferes with relationships, work, and school. GAD symptoms can vary from mild to severe. People with severe GAD can have intense waves of anxiety with physical symptoms that are similar to panic attacks. What are the causes? The exact cause of GAD is not known, but the following are believed to have an impact: Differences in natural brain chemicals. Genes passed down from parents to children. Differences in the way threats are perceived. Development and stress during childhood. Personality. What increases the risk? The following factors may make you more likely to develop this condition: Being female. Having a family history of anxiety disorders. Being very shy. Experiencing very stressful life events, such as the death of a loved one. Having a very stressful family environment. What are the signs or symptoms? People with GAD often worry excessively about many things in their lives, such as their health and family. Symptoms may also include: Mental and emotional symptoms: Worrying excessively about natural disasters. Fear of being late. Difficulty concentrating. Fears that others are judging your performance. Physical symptoms: Fatigue. Headaches, muscle tension, muscle twitches, trembling, or feeling shaky. Feeling like your heart is pounding or beating very fast. Feeling out of breath or like you cannot take a deep breath. Having trouble falling asleep or staying asleep, or experiencing restlessness. Sweating. Nausea, diarrhea, or irritable bowel syndrome (IBS). Behavioral  symptoms: Experiencing erratic moods or irritability. Avoidance of new situations. Avoidance of people. Extreme difficulty making decisions. How is this diagnosed? This condition is diagnosed based on your symptoms and medical history. You will also have a physical exam. Your health care provider may perform tests to rule out other possible causes of your symptoms. To be diagnosed with GAD, a person must have anxiety that: Is out of his or her control. Affects several different aspects of his or her life, such as work and relationships. Causes distress that makes him or her unable to take part in normal activities. Includes at least three symptoms of GAD, such as restlessness, fatigue, trouble concentrating, irritability, muscle tension, or sleep problems. Before your health care provider can confirm a diagnosis of GAD, these symptoms must be present more days than they are not, and they must last for 6 months or longer. How is this treated? This condition may be treated with: Medicine. Antidepressant medicine is usually prescribed for long-term daily control. Anti-anxiety medicines may be added in severe cases, especially when panic attacks occur. Talk therapy (psychotherapy). Certain types of talk therapy can be helpful in treating GAD by providing support, education, and guidance. Options include: Cognitive behavioral therapy (CBT). People learn coping skills and self-calming techniques to ease their physical symptoms. They learn to identify unrealistic thoughts and behaviors and to replace them with more appropriate thoughts and behaviors. Acceptance and commitment therapy (ACT). This treatment teaches people how to be mindful as a way to cope with unwanted thoughts and feelings. Biofeedback. This process trains you to manage your body's response (physiological response) through breathing techniques and relaxation methods. You will work with a therapist while machines are used to monitor your  physical symptoms. Stress management techniques.  These include yoga, meditation, and exercise. A mental health specialist can help determine which treatment is best for you. Some people see improvement with one type of therapy. However, other people require a combination of therapies. Follow these instructions at home: Lifestyle Maintain a consistent routine and schedule. Anticipate stressful situations. Create a plan and allow extra time to work with your plan. Practice stress management or self-calming techniques that you have learned from your therapist or your health care provider. Exercise regularly and spend time outdoors. Eat a healthy diet that includes plenty of vegetables, fruits, whole grains, low-fat dairy products, and lean protein. Do not eat a lot of foods that are high in fat, added sugar, or salt (sodium). Drink plenty of water. Avoid alcohol. Alcohol can increase anxiety. Avoid caffeine and certain over-the-counter cold medicines. These may make you feel worse. Ask your pharmacist which medicines to avoid. General instructions Take over-the-counter and prescription medicines only as told by your health care provider. Understand that you are likely to have setbacks. Accept this and be kind to yourself as you persist to take better care of yourself. Anticipate stressful situations. Create a plan and allow extra time to work with your plan. Recognize and accept your accomplishments, even if you judge them as small. Spend time with people who care about you. Keep all follow-up visits. This is important. Where to find more information [place]: https://carter.com/ Substance Abuse and Mental Health Services: ktimeonline.com Contact a health care provider if: Your symptoms do not get better. Your symptoms get worse. You have signs of depression, such as: A persistently sad or irritable mood. Loss of enjoyment in activities that used to bring you  joy. Change in weight or eating. Changes in sleeping habits. Get help right away if: You have thoughts about hurting yourself or others. If you ever feel like you may hurt yourself or others, or have thoughts about taking your own life, get help right away. Go to your nearest emergency department or: Call your local emergency services (911 in the [place] Call a suicide crisis helpline, such as the [place] at (830) 171-7256 or 988 in the [place]. This is open 24 hours a day in the [place] Text the Crisis Text Line at 209-041-3480 (in the [place].). Summary Generalized anxiety disorder (GAD) is a mental health condition that involves worry that is not triggered by a specific event. People with GAD often worry excessively about many things in their lives, such as their health and family. GAD may cause symptoms such as restlessness, trouble concentrating, sleep problems, frequent sweating, nausea, diarrhea, headaches, and trembling or muscle twitching. A mental health specialist can help determine which treatment is best for you. Some people see improvement with one type of therapy. However, other people require a combination of therapies. This information is not intended to replace advice given to you by your health care provider. Make sure you discuss any questions you have with your health care provider. Document Revised: 04/01/2021 Document Reviewed: 12/28/2020 Elsevier Patient Education  [place].

## 2022-09-07 ENCOUNTER — Other Ambulatory Visit: Payer: Medicare Other

## 2022-09-07 LAB — CBC WITH DIFFERENTIAL/PLATELET
Absolute Monocytes: 949 cells/uL (ref 200–950)
Basophils Absolute: 81 cells/uL (ref 0–200)
Basophils Relative: 0.8 %
Eosinophils Absolute: 162 cells/uL (ref 15–500)
Eosinophils Relative: 1.6 %
HCT: 42 % (ref 35.0–45.0)
Hemoglobin: 14.2 g/dL (ref 11.7–15.5)
Lymphs Abs: 2717 cells/uL (ref 850–3900)
MCH: 33.1 pg — ABNORMAL HIGH (ref 27.0–33.0)
MCHC: 33.8 g/dL (ref 32.0–36.0)
MCV: 97.9 fL (ref 80.0–100.0)
MPV: 11.5 fL (ref 7.5–12.5)
Monocytes Relative: 9.4 %
Neutro Abs: 6191 cells/uL (ref 1500–7800)
Neutrophils Relative %: 61.3 %
Platelets: 253 10*3/uL (ref 140–400)
RBC: 4.29 10*6/uL (ref 3.80–5.10)
RDW: 11.6 % (ref 11.0–15.0)
Total Lymphocyte: 26.9 %
WBC: 10.1 10*3/uL (ref 3.8–10.8)

## 2022-09-07 LAB — COMPLETE METABOLIC PANEL WITH GFR
AG Ratio: 1.2 (calc) (ref 1.0–2.5)
ALT: 14 U/L (ref 6–29)
AST: 23 U/L (ref 10–35)
Albumin: 3.6 g/dL (ref 3.6–5.1)
Alkaline phosphatase (APISO): 86 U/L (ref 37–153)
BUN/Creatinine Ratio: 16 (calc) (ref 6–22)
BUN: 39 mg/dL — ABNORMAL HIGH (ref 7–25)
CO2: 32 mmol/L (ref 20–32)
Calcium: 9.5 mg/dL (ref 8.6–10.4)
Chloride: 106 mmol/L (ref 98–110)
Creat: 2.45 mg/dL — ABNORMAL HIGH (ref 0.60–1.00)
Globulin: 3 g/dL (calc) (ref 1.9–3.7)
Glucose, Bld: 109 mg/dL — ABNORMAL HIGH (ref 65–99)
Potassium: 5.1 mmol/L (ref 3.5–5.3)
Sodium: 145 mmol/L (ref 135–146)
Total Bilirubin: 0.4 mg/dL (ref 0.2–1.2)
Total Protein: 6.6 g/dL (ref 6.1–8.1)
eGFR: 20 mL/min/{1.73_m2} — ABNORMAL LOW (ref >=60)

## 2022-09-09 ENCOUNTER — Telehealth: Payer: Self-pay

## 2022-09-09 ENCOUNTER — Encounter: Payer: Self-pay | Admitting: Nurse Practitioner

## 2022-09-09 NOTE — Telephone Encounter (Signed)
LM-09/09/22-Spoke w/ pt. Regarding her BP, counseling, and FOC. BP- Pt. states  her BP at home has been elevated around 125'I systolic Pt. was unable to give me any readings. Pt. states her BP was lower when she saw Tonya on 12/18 it was 188/70, Advised pt. to continue checking BP daily and to keep a log. Pt. states she is working on setting up counseling for her anxiety. Tried to schedule CP focused outreach for January, but pt. requested to c/b and schedule at the beginning of the year because she has a lot of doctor visits since being d/c from the hospital. (11 min.)

## 2022-09-14 ENCOUNTER — Other Ambulatory Visit: Payer: Self-pay | Admitting: Nurse Practitioner

## 2022-09-14 MED ORDER — CHLORTHALIDONE 25 MG PO TABS: 25.0000 mg | ORAL_TABLET | ORAL | 0 refills | Status: DC

## 2022-09-16 DIAGNOSIS — E113293 Type 2 diabetes mellitus with mild nonproliferative diabetic retinopathy without macular edema, bilateral: Secondary | ICD-10-CM | POA: Diagnosis not present

## 2022-09-16 DIAGNOSIS — H35033 Hypertensive retinopathy, bilateral: Secondary | ICD-10-CM | POA: Diagnosis not present

## 2022-09-16 DIAGNOSIS — H40023 Open angle with borderline findings, high risk, bilateral: Secondary | ICD-10-CM | POA: Diagnosis not present

## 2022-09-16 DIAGNOSIS — H04123 Dry eye syndrome of bilateral lacrimal glands: Secondary | ICD-10-CM | POA: Diagnosis not present

## 2022-09-16 LAB — HM DIABETES EYE EXAM

## 2022-09-17 ENCOUNTER — Encounter: Payer: Self-pay | Admitting: Internal Medicine

## 2022-09-20 ENCOUNTER — Encounter: Payer: Self-pay | Admitting: Nurse Practitioner

## 2022-09-21 ENCOUNTER — Encounter: Payer: Self-pay | Admitting: Nurse Practitioner

## 2022-09-21 DIAGNOSIS — Z8673 Personal history of transient ischemic attack (TIA), and cerebral infarction without residual deficits: Secondary | ICD-10-CM

## 2022-09-21 DIAGNOSIS — N184 Chronic kidney disease, stage 4 (severe): Secondary | ICD-10-CM

## 2022-09-21 DIAGNOSIS — E1169 Type 2 diabetes mellitus with other specified complication: Secondary | ICD-10-CM

## 2022-09-21 DIAGNOSIS — R42 Dizziness and giddiness: Secondary | ICD-10-CM

## 2022-09-21 DIAGNOSIS — I160 Hypertensive urgency: Secondary | ICD-10-CM

## 2022-09-24 ENCOUNTER — Telehealth: Payer: Self-pay

## 2022-09-24 NOTE — Telephone Encounter (Signed)
LM-09/24/22-Called pt. to assess BP and anxiety. LVMTRC X1. (2 min.)

## 2022-09-27 ENCOUNTER — Ambulatory Visit (INDEPENDENT_AMBULATORY_CARE_PROVIDER_SITE_OTHER): Payer: Medicare Other | Admitting: Internal Medicine

## 2022-09-27 ENCOUNTER — Encounter: Payer: Self-pay | Admitting: Internal Medicine

## 2022-09-27 VITALS — BP 124/82 | HR 54 | Ht 66.0 in | Wt 191.8 lb

## 2022-09-27 DIAGNOSIS — E785 Hyperlipidemia, unspecified: Secondary | ICD-10-CM | POA: Diagnosis not present

## 2022-09-27 DIAGNOSIS — D631 Anemia in chronic kidney disease: Secondary | ICD-10-CM | POA: Diagnosis not present

## 2022-09-27 DIAGNOSIS — N184 Chronic kidney disease, stage 4 (severe): Secondary | ICD-10-CM

## 2022-09-27 DIAGNOSIS — E1122 Type 2 diabetes mellitus with diabetic chronic kidney disease: Secondary | ICD-10-CM

## 2022-09-27 DIAGNOSIS — E669 Obesity, unspecified: Secondary | ICD-10-CM

## 2022-09-27 DIAGNOSIS — E1169 Type 2 diabetes mellitus with other specified complication: Secondary | ICD-10-CM

## 2022-09-27 DIAGNOSIS — N2581 Secondary hyperparathyroidism of renal origin: Secondary | ICD-10-CM | POA: Diagnosis not present

## 2022-09-27 DIAGNOSIS — I129 Hypertensive chronic kidney disease with stage 1 through stage 4 chronic kidney disease, or unspecified chronic kidney disease: Secondary | ICD-10-CM | POA: Diagnosis not present

## 2022-09-27 DIAGNOSIS — Z794 Long term (current) use of insulin: Secondary | ICD-10-CM | POA: Diagnosis not present

## 2022-09-27 NOTE — Progress Notes (Signed)
Patient ID: [name], female   DOB: 1946/08/03, 77 y.o.   MRN: [id number]   HPI: [name] is a 77 y.o.-year-old female, initially referred by her cardiologist, Dr.Hochrein, presenting for follow-up for DM2, dx in ~2000, insulin-dependent since ~2005, uncontrolled, with complications (PVD - s/p stents and fem-pop bypass; h/o CVA x3; CKD stage 3b; DR; PN). She saw my colleague, Dr. Loanne Drilling many years ago.  Last visit with me 4 months ago. PCP: [name], PA  Interim history: No increased urination, nausea, chest pain. She was admitted 12/8-07/2022 With chest pain, dizziness, shortness of breath-hypertensive urgency (sBP 200) - considered due to stress and anxiety.  She will have a carotid Doppler soon. She still has SOB at night. She is on a PPI (changed to Protonix from Omeprazole, at night). She has some choking at night. She had R eye blurry vision >> internal stye - was on ABx.  She also has dry eyes >> on eyedrops 2x a day.  Reviewed HbA1c levels: 05/31/2022: HbA1c calc. From fructosamine 6.5% Lab Results  Component Value Date   HGBA1C 7.6 (H) 08/04/2022   HGBA1C 7.6 (A) 05/31/2022   HGBA1C 7.6 (A) 01/22/2022   HGBA1C 7.1 (H) 11/17/2021   HGBA1C 8.5 (H) 08/04/2021   HGBA1C 8.7 (A) 06/08/2021   HGBA1C 8.4 (H) 12/15/2020   HGBA1C 8.7 (A) 09/22/2020   HGBA1C 9.6 (A) 05/21/2020   HGBA1C 9.8 (A) 01/14/2020  01/26/2021: HbA1c calculated from fructosamine is 7.47%, higher. 09/22/2020: HbA1c calculated from fructosamine is much better than the directly measured HbA1c, at 6.25% 05/21/2020: HbA1c calculated from fructosamine 7.3%, much better than the directly measured one, and stable from before 01/14/2020: HbA1c calculated from fructosamine 7.3% 10/08/2019: HbA1c calculated from fructosamine: 6.9%  She was previously on: - Novolog 70/30 35 units before breakfast and 30 units before dinner- injecting hips and arms due to abdominal pain - Farxiga 5 mg in am - started 06/2019 >> 10  mg daily - increased 09/2019  Currently on: - Farxiga 10 mg daily before b'fast - Tresiba 40 units daily - Novolog: 10-14 units 2x day before meals She could not tolerate Ozempic (started 11/2018) due to nausea, vomiting, abdominal pain-stopped 04/2019. She was on Invokana >> intolerance 2/2 weight loss and dehydration >> hospitalization. She was on Metformin and sulfonylurea at diagnosis. She was also on Januvia at the same time with Invokana >> stopped along with Invokana.  Pt was checking sugars 2-3x a day - am:  121-140 >> 71, 86-114 >> 68, 72-121, 144 >> 82-102 - 2h after b'fast: 326 >> n/c >> 326 (10/2019) >> n/c - before lunch: 265 >> 124-172 >> n/c >> 80s-100s - 2h after lunch: 280, 286 >> n/c >> 250 >> n/c - before dinner: 73-193, 215 >> n/c >> 180 >> <140 >> 76 >> n/c - 2h after dinner: 247 >> 196- 201 >> 172 >> n/c  - bedtime: n/c >> 178 - nighttime: n/c >> 68  - seldom >> 57 (took Novolog and skipped dinner!!!), 129 Lowest sugar was 68 >> 71 >> 86 >> 57 >> 82; she has hypoglycemia awareness at 80. Highest sugar was 335 (no medications) >> ... <200 >> 250 (missed meds) >> 100s  Glucometer: Livongo >> One Touch Verio  Pt's meals are: - Breakfast: eggs or cheese half a sandwich - Lunch: half a sandwich, apple - Dinner 9-9:30 pm: chicken, veggies - Snacks: stopped cheese; now apples and pickles, bedtime snack: orange or apple  She works part-time,  from 10 am - 2 pm Tue-Fri.   -+ CKD stage IIIb (Dr. Candiss Norse), last BUN/creatinine:  Lab Results  Component Value Date   BUN 39 (H) 09/06/2022   BUN 34 (H) 08/30/2022   CREATININE 2.45 (H) 09/06/2022   CREATININE 2.34 (H) 08/30/2022   09/06/2022, GFR 20 Lab Results  Component Value Date   GFRAA 32 (L) 12/15/2020   GFRAA 25 (L) 08/04/2020   GFRAA 23 (L) 07/31/2020   GFRAA 22 (L) 07/30/2020   GFRAA 31 (L) 10/18/2019  On olmesartan 40.  -+ HL; last set of lipids: Lab Results  Component Value Date   CHOL 152  08/04/2022   HDL 54 08/04/2022   LDLCALC 74 08/04/2022   LDLDIRECT 146.4 01/30/2010   TRIG 154 (H) 08/04/2022   CHOLHDL 2.8 08/04/2022  On Crestor 40 mg daily, omega-3 fatty acids 1200 mg daily.  - last eye exam was: 09/16/2022: No DR; prev. + DR. She also has glaucoma. Dr. Mali Frasier at [facility] care.  -+ Numbness and tingling in her feet.  She has a callus on the left foot.  She sees a podiatrist.  She did not tolerate Lyrica.  Last foot exam 05/31/2022, she had an infected ischemic ulcer due to arterial insufficiency after injury to her left foot in 09/2021.  She had left femoropopliteal bypass graft 11/18/2021.  Afterwards, she had a balloon angioplasty >> L blood flow improved >> she got back feeling in her toes.  On ASA 81.  Pt has FH of DM in sister.  She also has a history of nontoxic multinodular goiter.  Latest TSH was reviewed and this was normal: Lab Results  Component Value Date   TSH 3.915 08/28/2022   She also has a history of HTN, brain aneurysm, emphysema, esophageal stricture, GERD.  She had hernia repair surgery- had mesh placed >> persistent abdominal pain. She started to take a supplement and vitamins in 09/2021 (by [facility] [place]) - sugars started to improve afterwards.  Her husband's nephew, Modena Slater, was also my patient and he died at the beginning of December 04, 2019 after he stopped going to dialysis.  ROS: + see HPI  I reviewed pt's medications, allergies, PMH, social hx, family hx, and changes were documented in the history of present illness. Otherwise, unchanged from my initial visit note.  Past Medical History:  Diagnosis Date   Abnormal findings on esophagogastroduodenoscopy (EGD) 07/2010   Aneurysm ([place]) 03-Dec-2001   in brain x 2,  "small"   Anxiety    Arthritis    Cataract    CKD (chronic kidney disease), stage IV ([place])    followed by [facility]   Colon polyps    Diabetes mellitus Dec 03, 1996   Diverticulosis    Emphysema of lung  ([place])    "no one has evry told me that"   ESOPHAGEAL STRICTURE 08/27/2008   Family history of adverse reaction to anesthesia    daughter n/v   GERD (gastroesophageal reflux disease)    Hemorrhoids    Hiatal hernia    Hyperlipidemia    Hypertension 2000   Migraines    Neuropathy    fingers and toe   Peripheral vascular disease ([place])    Phlebitis    30 years ago  left leg   Stroke [facility])    multiple mini strokes ( brain aneurysm ).  Right side weaker.   Past Surgical History:  Procedure Laterality Date   ABDOMINAL AORTAGRAM N/A 01/04/2012   Procedure:  ABDOMINAL AORTAGRAM;  Surgeon: Serafina Mitchell, MD;  Location: [facility] CATH LAB;  Service: Cardiovascular;  Laterality: N/A;   ABDOMINAL AORTAGRAM N/A 08/15/2012   Procedure: ABDOMINAL Maxcine Ham;  Surgeon: Serafina Mitchell, MD;  Location: [facility] CATH LAB;  Service: Cardiovascular;  Laterality: N/A;   ABDOMINAL AORTOGRAM W/LOWER EXTREMITY Left 11/17/2021   Procedure: ABDOMINAL AORTOGRAM W/LOWER EXTREMITY;  Surgeon: Serafina Mitchell, MD;  Location: [place] CV LAB;  Service: Cardiovascular;  Laterality: Left;   ABDOMINAL AORTOGRAM W/LOWER EXTREMITY N/A 03/09/2022   Procedure: ABDOMINAL AORTOGRAM W/LOWER EXTREMITY;  Surgeon: Serafina Mitchell, MD;  Location: [place] CV LAB;  Service: Cardiovascular;  Laterality: N/A;   ABDOMINAL HYSTERECTOMY  1984   cataract surgery Right 10-22-2015   cataract surgery Left 11-05-2015   CESAREAN SECTION     CHOLECYSTECTOMY     COLONOSCOPY  07/2010   DENTAL SURGERY  Aug. 16, 2013   left lower    ESOPHAGOGASTRODUODENOSCOPY  07/2010   EYE SURGERY  Nov. 2014   Laser-Glaucoma   FEMORAL ARTERY STENT  05/11/11   Left superficial femoral and popliteal artery   FEMORAL-POPLITEAL BYPASS GRAFT Left 11/18/2021   Procedure: LEFT FEMORAL-POPLITEAL ARTERY BYPASS GRAFT;  Surgeon: Serafina Mitchell, MD;  Location: [place];  Service: Vascular;  Laterality: Left;   HERNIA REPAIR     times two   KNEE SURGERY     LOWER EXTREMITY  ANGIOGRAM Left 08/15/2012   Procedure: LOWER EXTREMITY ANGIOGRAM;  Surgeon: Serafina Mitchell, MD;  Location: [facility] CATH LAB;  Service: Cardiovascular;  Laterality: Left;  lt leg angio   PERIPHERAL VASCULAR BALLOON ANGIOPLASTY Left 03/09/2022   Procedure: PERIPHERAL VASCULAR BALLOON ANGIOPLASTY;  Surgeon: Serafina Mitchell, MD;  Location: [place] CV LAB;  Service: Cardiovascular;  Laterality: Left;   rotator cuff surgery     THYROID SURGERY     Social History   Socioeconomic History   Marital status: Married    Spouse name: Not on file   Number of children: 2   Years of education: Not on file   Highest education level: Not on file  Occupational History   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  Tobacco Use   Smoking status: Light Smoker    Packs/day: 0.40    Years: 40.00    Total pack years: 16.00    Types: Cigarettes   Smokeless tobacco: Never   Tobacco comments:    11/18/20 in process of quiting  Vaping Use   Vaping Use: Never used  Substance and Sexual Activity   Alcohol use: No    Alcohol/week: 0.0 standard drinks of alcohol   Drug use: Never   Sexual activity: Not on file  Other Topics Concern   Not on file  Social History Narrative   Lives with husband.        Social Determinants of Health   Financial Resource Strain: Not on file  Food Insecurity: No Food Insecurity (08/28/2022)   Hunger Vital Sign    Worried About Running Out of Food in the Last Year: Never true    Ran Out of Food in the Last Year: Never true  Transportation Needs: No Transportation Needs (08/28/2022)   PRAPARE - Hydrologist (Medical): No    Lack of Transportation (Non-Medical): No  Physical Activity: Not on file  Stress: Not on file  Social Connections: Not on file  Intimate Partner Violence: Not At Risk (08/28/2022)   Humiliation, Afraid, Rape, and Kick questionnaire  Fear of Current or Ex-Partner: No    Emotionally Abused: No    Physically Abused: No     Sexually Abused: No   Current Outpatient Medications on File Prior to Visit  Medication Sig Dispense Refill   acetaminophen (TYLENOL) 500 MG tablet Take 1,000 mg by mouth every 6 (six) hours as needed for mild pain.     amLODipine (NORVASC) 10 MG tablet Take 1 tablet (10 mg total) by mouth daily. for blood pressure 90 tablet 3   aspirin 325 MG EC tablet Take 325 mg by mouth at bedtime.     b complex vitamins capsule Take 2 capsules by mouth daily.     Blood Glucose Monitoring Suppl (ONETOUCH VERIO FLEX SYSTEM) w/Device KIT USE AS ADVISED 1 kit 0   busPIRone (BUSPAR) 5 MG tablet TAKE 1 TABLET BY MOUTH THREE TIMES A DAY (Patient taking differently: Take 5 mg by mouth 3 (three) times daily as needed (anxiety).) 270 tablet 1   chlorthalidone (HYGROTON) 25 MG tablet Take 1 tablet (25 mg total) by mouth every other day. 30 tablet 0   citalopram (CELEXA) 40 MG tablet TAKE 1 TABLET BY MOUTH EVERY DAY IN THE MORNING (Patient taking differently: Take 40 mg by mouth daily.) 90 tablet 2   Coenzyme Q10 (COQ10) 100 MG CAPS Take 100 mg by mouth daily.     dapagliflozin propanediol (FARXIGA) 10 MG TABS tablet TAKE 1 TABLET BY MOUTH EVERY DAY BEFORE BREAKFAST. (Patient taking differently: Take 10 mg by mouth daily.) 90 tablet 3   Ginkgo Biloba 120 MG TABS Take 120 mg by mouth daily.     glucose blood test strip Use as instructed 3x a day - One Touch Verio 300 each 3   hydrALAZINE (APRESOLINE) 50 MG tablet Take 1 tablet (50 mg total) by mouth every 8 (eight) hours. 90 tablet 1   insulin aspart (NOVOLOG FLEXPEN) 100 UNIT/ML FlexPen Inject 10-14 Units into the skin 3 (three) times daily before meals. 45 mL 1   insulin degludec (TRESIBA) 100 UNIT/ML FlexTouch Pen Inject 40 Units into the skin daily. 30 mL 1   meclizine (ANTIVERT) 25 MG tablet 1/2 tab up to 3 times daily for motion sickness/dizziness. (Patient taking differently: Take 12.5 mg by mouth 3 (three) times daily as needed for dizziness. 1/2 tab up to 3  times daily for motion sickness/dizziness.) 30 tablet 0   nicotine (NICODERM CQ - DOSED IN MG/24 HOURS) 14 mg/24hr patch Place 1 patch (14 mg total) onto the skin daily. (Patient not taking: Reported on 09/06/2022) 28 patch 0   olmesartan (BENICAR) 40 MG tablet Take 1 tablet (40 mg total) by mouth daily. for blood pressure 90 tablet 3   Omega-3 Fatty Acids (FISH OIL) 1000 MG CAPS Take 1,000 mg by mouth every morning.     OneTouch Delica Lancets 94T MISC Use 3x a day 200 each 3   pantoprazole (PROTONIX) 40 MG tablet TAKE 1 TABLET BY MOUTH EVERY DAY 90 tablet 1   Polyethyl Glycol-Propyl Glycol (SYSTANE ULTRA OP) Place 1 drop into both eyes daily at 12 noon. Additional drop if needed for dry eyes     rOPINIRole (REQUIP) 0.25 MG tablet TAKE 1 TABLET BY MOUTH AT BEDTIME. 90 tablet 2   rosuvastatin (CRESTOR) 40 MG tablet TAKE 1 /2 TO 1 TABLET DAILY FOR CHOLESTEROL (Patient taking differently: Take 40 mg by mouth daily.) 90 tablet 1   No current facility-administered medications on file prior to visit.   Allergies  Allergen Reactions   Ciprofloxacin Swelling   Ozempic (0.25 Or 0.5 Mg-Dose) [Semaglutide(0.25 Or 0.'5mg'$ -Dos)] Nausea And Vomiting   Plavix [Clopidogrel Bisulfate] Palpitations   Amoxicillin Itching and Swelling    FACE & EYES SWELL   Ace Inhibitors Cough    Sore throat   Atorvastatin     myalgias   Lyrica [Pregabalin] Itching and Nausea Only     gain weight   Penicillins Other (See Comments)    Pt unsure if there is an allergic reaction    Family History  Problem Relation Age of Onset   CAD Mother 65       Died of MI   Hypertension Mother    Heart attack Mother    Heart disease Mother    CAD Father 27       Died of MI   Heart disease Father    Heart attack Father    CAD Brother 75       Two brothers died of MI   Heart attack Brother    Heart disease Brother        Amputation   Diabetes Sister    Hypertension Sister    Heart attack Brother    Heart disease Brother     Stroke Sister    Colon cancer Neg Hx    Stomach cancer Neg Hx    Esophageal cancer Neg Hx     PE: BP 124/82 (BP Location: Left Arm, Patient Position: Sitting, Cuff Size: Normal)   Pulse (!) 54   Ht '5\' 6"'$  (1.676 m)   Wt 191 lb 12.8 oz (87 kg)   SpO2 94%   BMI 30.96 kg/m  Wt Readings from Last 3 Encounters:  09/27/22 191 lb 12.8 oz (87 kg)  09/06/22 190 lb 12.8 oz (86.5 kg)  08/28/22 187 lb (84.8 kg)   Constitutional: overweight, in NAD Eyes:  EOMI, no exophthalmos ENT: no neck masses, no cervical lymphadenopathy Cardiovascular: RRR, No MRG Respiratory: CTA in L lung but wheezing in RLL. Musculoskeletal: no deformities Skin:no rashes Neurological: no tremor with outstretched hands  ASSESSMENT: 1. DM2, insulin-dependent, uncontrolled, with complications - PVD - s/p stents and fem-pop bypass - h/o CVA - CKD stage 3b - DR - PN  Her antipancreatic antibodies were negative: Component     Latest Ref Rng & Units 07/22/2016  Glutamic Acid Decarb Ab     <5 IU/mL <5   2.  Hyperlipidemia  3.  Obesity class 1  PLAN:  1. Patient with longstanding, shoulder, type 2 diabetes, previously on a premixed insulin regimen and now on basal/bolus regimen and SGLT2 inhibitor.  She could not tolerate a GLP-1 Ozempic) due to GI symptoms.  She refused to try Trulicity.  She also declined a V-Go patch pump.  At last visit, HbA1c was 7.6%, but an HbA1c calculated from fructosamine was 6.5%, correlating better with her blood sugars at home.  She had another HbA1c obtained 2 months ago and this was stable ,7.6%. -At last visit sugars were at goal in the morning but she was not taking enough later in the day to understand trends.  I advised her to also check some sugars later in the day, rotating check times.  She had 1 low, 57, when she took NovoLog but she forgot to eat.  We discussed that she should always eat if she already took NovoLog. -At today's visit, sugars are well-controlled.  Reviewing her  chart, her most recent GFR was 20.  We discussed about holding  [place] for now until she sees nephrology.  She has an appointment coming up this afternoon, actually.  Otherwise, we can continue the same insulin regimen. - I suggested to:  Patient Instructions  Please  continue: - Tresiba 40 units daily - Novolog: 10-14 units 2x day before meals 4-5 units for correction of a blood sugars >200    Hold Farxiga for now until cleared by nephrology.  Please return in 4 months with your sugar log.   - advised to check sugars at different times of the day - 4x a day, rotating check times - advised for yearly eye exams >> she is UTD - return to clinic in 4 months  2. HL -Latest lipid panel reviewed -LDL above our target of less than 55 due to cardiovascular disease, but 50% of her original LDL: Lab Results  Component Value Date   CHOL 152 08/04/2022   HDL 54 08/04/2022   LDLCALC 74 08/04/2022   LDLDIRECT 146.4 01/30/2010   TRIG 154 (H) 08/04/2022   CHOLHDL 2.8 08/04/2022  -On Crestor 20-40 mg daily (per cardiology) omega-3 fatty acids 1200 mg daily.  No side effects.  3.  Obesity class 1 -Unfortunately we could not continue a GLP-1 receptor agonist due to intolerance -At this visit, we will also hold the SGLT2 inhibitor until she discusses with nephrology, since her GFR is 20 -She gained 3 pounds before last visit, previously lost 8 -She gained 5 pounds since last visit  Philemon Kingdom, MD PhD [facility] Endocrinology

## 2022-09-27 NOTE — Patient Instructions (Addendum)
Please  continue: - Tresiba 40 units daily - Novolog: 10-14 units 2x day before meals 4-5 units for correction of a blood sugars >200    Hold Farxiga for now until cleared by nephrology.  Please return in 4 months with your sugar log.

## 2022-10-08 ENCOUNTER — Other Ambulatory Visit: Payer: Self-pay | Admitting: Nurse Practitioner

## 2022-10-08 DIAGNOSIS — E1169 Type 2 diabetes mellitus with other specified complication: Secondary | ICD-10-CM

## 2022-10-18 ENCOUNTER — Encounter: Payer: Self-pay | Admitting: Internal Medicine

## 2022-10-28 ENCOUNTER — Other Ambulatory Visit: Payer: Self-pay

## 2022-10-28 DIAGNOSIS — E1122 Type 2 diabetes mellitus with diabetic chronic kidney disease: Secondary | ICD-10-CM

## 2022-10-28 MED ORDER — EMPAGLIFLOZIN 25 MG PO TABS: 25.0000 mg | ORAL_TABLET | Freq: Every day | ORAL | 1 refills | Status: DC

## 2022-11-08 ENCOUNTER — Ambulatory Visit: Payer: Medicare Other | Admitting: Nurse Practitioner

## 2022-11-10 ENCOUNTER — Ambulatory Visit (INDEPENDENT_AMBULATORY_CARE_PROVIDER_SITE_OTHER): Payer: Medicare Other | Admitting: Nurse Practitioner

## 2022-11-10 VITALS — BP 160/80 | HR 68 | Temp 97.7°F | Ht 66.0 in | Wt 191.6 lb

## 2022-11-10 DIAGNOSIS — E1159 Type 2 diabetes mellitus with other circulatory complications: Secondary | ICD-10-CM | POA: Diagnosis not present

## 2022-11-10 DIAGNOSIS — F411 Generalized anxiety disorder: Secondary | ICD-10-CM | POA: Diagnosis not present

## 2022-11-10 DIAGNOSIS — Z794 Long term (current) use of insulin: Secondary | ICD-10-CM

## 2022-11-10 DIAGNOSIS — E1122 Type 2 diabetes mellitus with diabetic chronic kidney disease: Secondary | ICD-10-CM

## 2022-11-10 DIAGNOSIS — E1169 Type 2 diabetes mellitus with other specified complication: Secondary | ICD-10-CM | POA: Diagnosis not present

## 2022-11-10 DIAGNOSIS — E785 Hyperlipidemia, unspecified: Secondary | ICD-10-CM | POA: Diagnosis not present

## 2022-11-10 DIAGNOSIS — E1351 Other specified diabetes mellitus with diabetic peripheral angiopathy without gangrene: Secondary | ICD-10-CM | POA: Diagnosis not present

## 2022-11-10 DIAGNOSIS — Z8673 Personal history of transient ischemic attack (TIA), and cerebral infarction without residual deficits: Secondary | ICD-10-CM

## 2022-11-10 DIAGNOSIS — Z79899 Other long term (current) drug therapy: Secondary | ICD-10-CM | POA: Diagnosis not present

## 2022-11-10 DIAGNOSIS — N184 Chronic kidney disease, stage 4 (severe): Secondary | ICD-10-CM

## 2022-11-10 DIAGNOSIS — I10 Essential (primary) hypertension: Secondary | ICD-10-CM | POA: Diagnosis not present

## 2022-11-10 NOTE — Progress Notes (Signed)
FOLLOW UP  Assessment and Plan:   Type 2 diabetes mellitus with other circulatory complication, with long-term current use of insulin [facility]) Education: Reviewed 'ABCs' of diabetes management  Discussed goals to be met and/or maintained include A1C (<7) Blood pressure (<130/80) Cholesterol (LDL <70) Continue Eye Exam yearly  Continue Dental Exam Q6 mo Discussed dietary recommendations Discussed Physical Activity recommendations Check A1C   CKD stage 4 due to type 2 diabetes mellitus ([place]) Dr. Candiss Norse [facility]phrology following.  Stay well hydrated. Avoid high salt foods. Avoid NSAIDS. Keep BP and BG well controlled.   Take medications as prescribed. Remain active and exercise as tolerated daily. Maintain weight.  Continue to monitor. Check CMP/GFR/Microablumin  History of CVA (cerebrovascular accident) Keep BP well controlled  Continue to monitor  Hyperlipidemia associated with type 2 diabetes mellitus ([place]) Discussed lifestyle modifications. Recommended diet heavy in fruits and veggies, omega 3's. Decrease consumption of animal meats, cheeses, and dairy products. Remain active and exercise as tolerated. Continue to monitor. Check lipids/TSH  Hypertension Above goal Discussed DASH (Dietary Approaches to Stop Hypertension) DASH diet is lower in sodium than a typical American diet. Cut back on foods that are high in saturated fat, cholesterol, and trans fats. Eat more whole-grain foods, fish, poultry, and nuts Remain active and exercise as tolerated daily.  Monitor BP at home-Call if greater than 130/80.  Discussed recording BP (log provided) and RTC in 2- 3 weeks for review. Check CMP/CBC    Peripheral vascular disease due to secondary diabetes mellitus (HCC) Keep BP, BG and lipids well controlled. Continue to follow with Cardiology  Generalized anxiety disorder Continue medications.   Reviewed relaxation techniques.  Sleep hygiene. Recommended mindfulness meditation  and exercise.   Encouraged personality growth wand development through coping techniques and problem-solving skills. Limit/Decrease/Monitor drug/alcohol intake.    Patient to look for counselor/psychologist  Medication management All medications discussed and reviewed in full. All questions and concerns regarding medications addressed.    Lab work completed 09/07/23; reviewed and stable.  Will continue to assess Q3 mo.    Today's Plan of Care is based on a patient-centered health care approach known as shared decision making - the decisions, tests and treatments allow for patient preferences and values to be balanced with clinical evidence.     Notify office for further evaluation and treatment, questions or concerns if any reported s/s fail to improve.   The patient was advised to call back or seek an in-person evaluation if any symptoms worsen or if the condition fails to improve as anticipated.   Further disposition pending results of labs. Discussed med's effects and SE's.    I discussed the assessment and treatment plan with the patient. The patient was provided an opportunity to ask questions and all were answered. The patient agreed with the plan and demonstrated an understanding of the instructions.  Discussed med's effects and SE's. Screening labs and tests as requested with regular follow-up as recommended.  I provided 25 minutes of face-to-face time during this encounter including counseling, chart review, and critical decision making was preformed.    Future Appointments  Date Time Provider [place]  02/07/2023  9:20 AM Philemon Kingdom, MD [facility] None  02/16/2023  4:00 PM [name], MD [facility] None  08/08/2023  2:00 PM Alycia Rossetti, NP [facility] None    ----------------------------------------------------------------------------------------------------------------------  HPI 77 y.o. female  presents for 3 month follow up on hypertension,  cholesterol, diabetes, weight and vitamin D deficiency.   Overall she reports  feeling well today.    She continues to work part-time, from 10 am - 2 pm Tue-Fri.  She was initially referred by her cardiologist, Dr.Hochrein, presenting for follow-up for DM2, dx in ~2000, insulin-dependent since ~2005, uncontrolled, with complications (PVD - s/p stents and fem-pop bypass; h/o CVA x3; CKD stage 3b; DR; PN).    She was admitted to hospital 12/8-07/2022 With chest pain, dizziness, shortness of breath-hypertensive urgency (sBP 200) - considered due to stress and anxiety.   She still has SOB at night. She is on a PPI (changed to Protonix from Omeprazole, at night). She had R eye blurry vision, noted to have an internal stye - was on ABx.  She also has dry eyes and continued  eyedrops 2x a day.  She follows with Vascular, for carotid stenosis, last seen 11/15/22.     She has a hx of DM2; follows with Dr. Cruzita Lederer, Endocrinology.  Last seen1/8/24. Lab Results  Component Value Date   HGBA1C 7.6 (H) 08/04/2022   She was previously on the following - Novolog 70/30 35 units before breakfast and 30 units before dinner- injecting hips and arms due to abdominal pain - Farxiga 5 mg in am - started 06/2019 10 mg daily - increased 09/2019  She could not tolerate Ozempic (started 11/2018) due to nausea, vomiting, abdominal pain-stopped 04/2019. She was on Invokana intolerance 2/2 weight loss and dehydration, hospitalization. She was on Metformin and sulfonylurea at diagnosis. She was also on Januvia at the same time with Invokana and stopped along with Invokana.   Last eye exam was: 09/16/2022: No DR; prev. + DR. She also has glaucoma. Dr. Mali Frasier at [facility] care.  She has some numbness and tingling in her feet.  She has a callus on the left foot.  She sees a podiatrist.  She did not tolerate Lyrica.  Last foot exam 05/31/2022, she had an infected ischemic ulcer due to arterial insufficiency after injury  to her left foot in 09/2021.    Of note, she had left femoropopliteal bypass graft 11/18/2021.  Afterwards, she had a balloon angioplasty, L blood flow improved, she got back feeling in her toes.  Her BP is above goal today.  She is asymptomatic, denies CP, heart palpitations, SOB, dizziness.   BP: (!) 160/80    She follows with Dr. Candiss Norse for CDK 4.   Lab Results  Component Value Date   EGFR 20 (L) 09/06/2022    She is on cholesterol medication, Rosuvastatin and denies myalgias. Her cholesterol is at goal. The cholesterol last visit was:   Lab Results  Component Value Date   CHOL 152 08/04/2022   HDL 54 08/04/2022   LDLCALC 74 08/04/2022   LDLDIRECT 146.4 01/30/2010   TRIG 154 (H) 08/04/2022   CHOLHDL 2.8 08/04/2022      She also has a history of nontoxic multinodular goiter.  Lab Results  Component Value Date   TSH 3.915 08/28/2022    She also has a history of HTN, brain aneurysm, emphysema, esophageal stricture, GERD.  She had hernia repair surgery- had mesh placed, continues to have intermittent abdominal pain.   BMI is Body mass index is 30.93 kg/m., she has been working on diet and exercise. Wt Readings from Last 3 Encounters:  11/15/22 191 lb 3.2 oz (86.7 kg)  11/10/22 191 lb 9.6 oz (86.9 kg)  09/27/22 191 lb 12.8 oz (87 kg)    Her blood pressure has been controlled at home, today their BP is  BP: (!) 148/80  She does not workout. She denies chest pain, shortness of breath, dizziness.  Patient is not currently on Vitamin D supplement.   Lab Results  Component Value Date   VD25OH 29 (L) 08/04/2022      Current Medications:  Current Outpatient Medications on File Prior to Visit  Medication Sig   acetaminophen (TYLENOL) 500 MG tablet Take 1,000 mg by mouth every 6 (six) hours as needed for mild pain.   amLODipine (NORVASC) 10 MG tablet Take 1 tablet (10 mg total) by mouth daily. for blood pressure   aspirin 325 MG EC tablet Take 325 mg by mouth at bedtime.    Blood Glucose Monitoring Suppl (ONETOUCH VERIO FLEX SYSTEM) w/Device KIT USE AS ADVISED   busPIRone (BUSPAR) 5 MG tablet TAKE 1 TABLET BY MOUTH THREE TIMES A DAY (Patient taking differently: Take 5 mg by mouth 3 (three) times daily as needed (anxiety).)   citalopram (CELEXA) 40 MG tablet TAKE 1 TABLET BY MOUTH EVERY DAY IN THE MORNING (Patient taking differently: Take 40 mg by mouth daily.)   dapagliflozin propanediol (FARXIGA) 10 MG TABS tablet Take by mouth daily.   empagliflozin (JARDIANCE) 25 MG TABS tablet Take 1 tablet (25 mg total) by mouth daily before breakfast.   Ginkgo Biloba 120 MG TABS Take 120 mg by mouth daily.   glucose blood test strip Use as instructed 3x a day - One Touch Verio   insulin aspart (NOVOLOG FLEXPEN) 100 UNIT/ML FlexPen Inject 10-14 Units into the skin 3 (three) times daily before meals.   insulin degludec (TRESIBA) 100 UNIT/ML FlexTouch Pen Inject 40 Units into the skin daily.   meclizine (ANTIVERT) 25 MG tablet 1/2 tab up to 3 times daily for motion sickness/dizziness. (Patient taking differently: Take 12.5 mg by mouth 3 (three) times daily as needed for dizziness. 1/2 tab up to 3 times daily for motion sickness/dizziness.)   olmesartan (BENICAR) 40 MG tablet Take 1 tablet (40 mg total) by mouth daily. for blood pressure   Omega-3 Fatty Acids (FISH OIL) 1000 MG CAPS Take 1,000 mg by mouth every morning.   OneTouch Delica Lancets 99991111 MISC Use 3x a day   pantoprazole (PROTONIX) 40 MG tablet TAKE 1 TABLET BY MOUTH EVERY DAY   Polyethyl Glycol-Propyl Glycol (SYSTANE ULTRA OP) Place 1 drop into both eyes daily at 12 noon. Additional drop if needed for dry eyes   rOPINIRole (REQUIP) 0.25 MG tablet TAKE 1 TABLET BY MOUTH AT BEDTIME.   rosuvastatin (CRESTOR) 40 MG tablet Take 1 tablet (40 mg total) by mouth daily.   b complex vitamins capsule Take 2 capsules by mouth daily. (Patient not taking: Reported on 11/10/2022)   chlorthalidone (HYGROTON) 25 MG tablet Take 1 tablet  (25 mg total) by mouth every other day. (Patient not taking: Reported on 11/10/2022)   Coenzyme Q10 (COQ10) 100 MG CAPS Take 100 mg by mouth daily. (Patient not taking: Reported on 11/10/2022)   hydrALAZINE (APRESOLINE) 50 MG tablet Take 1 tablet (50 mg total) by mouth every 8 (eight) hours. (Patient not taking: Reported on 11/10/2022)   nicotine (NICODERM CQ - DOSED IN MG/24 HOURS) 14 mg/24hr patch Place 1 patch (14 mg total) onto the skin daily. (Patient not taking: Reported on 09/06/2022)   No current facility-administered medications on file prior to visit.     Allergies:  Allergies  Allergen Reactions   Ciprofloxacin Swelling   Ozempic (0.25 Or 0.5 Mg-Dose) [Semaglutide(0.25 Or 0.'5mg'$ -Dos)] Nausea And Vomiting   Plavix [  Clopidogrel Bisulfate] Palpitations   Amoxicillin Itching and Swelling    FACE & EYES SWELL   Ace Inhibitors Cough    Sore throat   Atorvastatin     myalgias   Lyrica [Pregabalin] Itching and Nausea Only     gain weight   Penicillins Other (See Comments)    Pt unsure if there is an allergic reaction      Medical History:  Past Medical History:  Diagnosis Date   Abnormal findings on esophagogastroduodenoscopy (EGD) 07/2010   Aneurysm ([place]) 2003   in brain x 2,  "small"   Anxiety    Arthritis    Cataract    CKD (chronic kidney disease), stage IV ([place])    followed by [facility]   Colon polyps    Diabetes mellitus 1998   Diverticulosis    Emphysema of lung ([place])    "no one has evry told me that"   ESOPHAGEAL STRICTURE 08/27/2008   Family history of adverse reaction to anesthesia    daughter n/v   GERD (gastroesophageal reflux disease)    Hemorrhoids    Hiatal hernia    Hyperlipidemia    Hypertension 2000   Migraines    Neuropathy    fingers and toe   Peripheral vascular disease ([place])    Phlebitis    30 years ago  left leg   Stroke [facility])    multiple mini strokes ( brain aneurysm ).  Right side weaker.   Family history- Reviewed and  unchanged Social history- Reviewed and unchanged   Review of Systems: A complete ROS was performed with pertinent positives/negatives noted in the HPI. The remainder of the ROS are negative.  Physical Exam: BP (!) 148/80   Pulse 68   Temp 97.7 F (36.5 C)   Ht '5\' 6"'$  (1.676 m)   Wt 191 lb 9.6 oz (86.9 kg)   SpO2 98%   BMI 30.93 kg/m   General Appearance: Obese. Well nourished, in no apparent distress. Eyes: PERRLA, EOMs, conjunctiva no swelling or erythema Sinuses: No Frontal/maxillary tenderness ENT/Mouth: Ext aud canals clear, TMs without erythema, bulging. No erythema, swelling, or exudate on post pharynx.  Tonsils not swollen or erythematous. Hearing normal.  Neck: Supple, thyroid normal.  Respiratory: Respiratory effort normal, BS equal bilaterally without rales, rhonchi, wheezing or stridor.  Cardio: RRR with no MRGs. Brisk peripheral pulses without edema.  Abdomen: Soft, + BS.  Non tender, no guarding, rebound, hernias, masses. Lymphatics: Non tender without lymphadenopathy.  Musculoskeletal: Full ROM, 5/5 strength, Normal gait Skin: Warm, dry without rashes, lesions, ecchymosis.  Neuro: Cranial nerves intact. No cerebellar symptoms.  Psych: Awake and oriented X 3, normal affect, Insight and Judgment appropriate.    Darrol Jump, NP 5:56 AM [facility]

## 2022-11-10 NOTE — Patient Instructions (Addendum)
How to Take Your Blood Pressure Blood pressure measures how strongly your blood is pressing against the walls of your arteries. Arteries are blood vessels that carry blood from your heart throughout your body. You can take your blood pressure at home with a machine. You may need to check your blood pressure at home: To check if you have high blood pressure (hypertension). To check your blood pressure over time. To make sure your blood pressure medicine is working. Supplies needed: Blood pressure machine, or monitor. A chair to sit in. This should be a chair where you can sit upright with your back supported. Do not sit on a soft couch or an armchair. Table or desk. Small notebook. Pencil or pen. How to prepare Avoid these things for 30 minutes before checking your blood pressure: Having drinks with caffeine in them, such as coffee or tea. Drinking alcohol. Eating. Smoking. Exercising. Do these things five minutes before checking your blood pressure: Go to the bathroom and pee (urinate). Sit in a chair. Be quiet. Do not talk. How to take your blood pressure Follow the instructions that came with your machine. If you have a digital blood pressure monitor, these may be the instructions: Sit up straight. Place your feet on the floor. Do not cross your ankles or legs. Rest your left arm at the level of your heart. You may rest it on a table, desk, or chair. Pull up your shirt sleeve. Wrap the blood pressure cuff around the upper part of your left arm. The cuff should be 1 inch (2.5 cm) above your elbow. It is best to wrap the cuff around bare skin. Fit the cuff snugly around your arm, but not too tightly. You should be able to place only one finger between the cuff and your arm. Place the cord so that it rests in the bend of your elbow. Press the power button. Sit quietly while the cuff fills with air and loses air. Write down the numbers on the screen. Wait 2-3 minutes and then repeat  steps 1-10. What do the numbers mean? Two numbers make up your blood pressure. The first number is called systolic pressure. The second is called diastolic pressure. An example of a blood pressure reading is "120 over 80" (or 120/80). If you are an adult and do not have a medical condition, use this guide to find out if your blood pressure is normal: Normal First number: below 120. Second number: below 80. Elevated First number: 120-129. Second number: below 80. Hypertension stage 1 First number: 130-139. Second number: 80-89. Hypertension stage 2 First number: 140 or above. Second number: 55 or above. Your blood pressure is above normal even if only the first or only the second number is above normal. Follow these instructions at home: Medicines Take over-the-counter and prescription medicines only as told by your doctor. Tell your doctor if your medicine is causing side effects. General instructions Check your blood pressure as often as your doctor tells you to. Check your blood pressure at the same time every day. Take your monitor to your next doctor's appointment. Your doctor will: Make sure you are using it correctly. Make sure it is working right. Understand what your blood pressure numbers should be. Keep all follow-up visits. General tips You will need a blood pressure machine or monitor. Your doctor can suggest a monitor. You can buy one at a drugstore or online. When choosing one: Choose one with an arm cuff. Choose one that wraps around your  upper arm. Only one finger should fit between your arm and the cuff. Do not choose one that measures your blood pressure from your wrist or finger. Where to find more information American Heart Association: www.heart.org Contact a doctor if: Your blood pressure keeps being high. Your blood pressure is suddenly low. Get help right away if: Your first blood pressure number is higher than 180. Your second blood pressure number is  higher than 120. These symptoms may be an emergency. Do not wait to see if the symptoms will go away. Get help right away. Call 911. Summary Check your blood pressure at the same time every day. Avoid caffeine, alcohol, smoking, and exercise for 30 minutes before checking your blood pressure. Make sure you understand what your blood pressure numbers should be. This information is not intended to replace advice given to you by your health care provider. Make sure you discuss any questions you have with your health care provider. Document Revised: 05/21/2021 Document Reviewed: 05/21/2021 Elsevier Patient Education  [place].   Blood Pressure Record Sheet To take your blood pressure, you will need a blood pressure machine. You may be prescribed one, or you can buy a blood pressure machine (blood pressure monitor) at your clinic, drug store, or online. When choosing one, look for these features: An automatic monitor that has an arm cuff. A cuff that wraps snugly, but not too tightly, around your upper arm. You should be able to fit only one finger between your arm and the cuff. A device that stores blood pressure reading results. Do not choose a monitor that measures your blood pressure from your wrist or finger. Follow your health care provider's instructions for how to take your blood pressure. To use this form: Get one reading in the morning (a.m.) before you take any medicines. Get one reading in the evening (p.m.) before supper. Take at least two readings with each blood pressure check. This makes sure the results are correct. Wait 1-2 minutes between measurements. Write down the results in the spaces on this form. Repeat this once a week, or as told by your health care provider. Make a follow-up appointment with your health care provider to discuss the results. Blood pressure log Date: _______________________ a.m. _____________________(1st reading) _____________________(2nd  reading) p.m. _____________________(1st reading) _____________________(2nd reading) Date: _______________________ a.m. _____________________(1st reading) _____________________(2nd reading) p.m. _____________________(1st reading) _____________________(2nd reading) Date: _______________________ a.m. _____________________(1st reading) _____________________(2nd reading) p.m. _____________________(1st reading) _____________________(2nd reading) Date: _______________________ a.m. _____________________(1st reading) _____________________(2nd reading) p.m. _____________________(1st reading) _____________________(2nd reading) Date: _______________________ a.m. _____________________(1st reading) _____________________(2nd reading) p.m. _____________________(1st reading) _____________________(2nd reading) This information is not intended to replace advice given to you by your health care provider. Make sure you discuss any questions you have with your health care provider. Document Revised: 05/21/2021 Document Reviewed: 05/21/2021 Elsevier Patient Education  [place].

## 2022-11-12 NOTE — Progress Notes (Unsigned)
HISTORY AND PHYSICAL     CC:  follow up. Requesting Provider:  [name], MD  HPI: This is a 77 y.o. female who is here today for follow up and is pt of Dr. Trula Slade and has hx of left SFA stenting in 2012 which ultimately failed. She then required a left femoral to below the knee popliteal bypass with vein by Dr. Trula Slade on 11/18/2021 due to left ankle wound. Postoperatively she developed stenosis at the distal anastomosis which required drug-coated balloon angioplasty on 03/09/2022 by Dr. Trula Slade.   Pt was last seen on 08/09/2022.  At that time, she was not having any claudication or new wounds.  She was still smoking 1/2 ppd.  She had a significant stenosis at the proximal anastomosis, however she was not having any symptoms and the velocities in the rest of the graft were not affected and ABI stable.  Given she has hx of CKD that was worsening, a close interval follow up was recommended.    The pt returns today for follow up and here with her friend.    Pt denies any amaurosis fugax, speech difficulties, weakness, numbness, paralysis or clumsiness or facial droop that is new.  She has hx of stroke in the past with residual right sided weakness and numbness.  She states that she has some blurry vision in both eyes but not temporary blindness.  She is following with an eye doctor.   Pt denies claudication, rest pain, or non healing wounds.  She does have neuropathy in both feet.  She does have some complaints of pitting edema left leg.  She wears compression socks daily and this helps. She does elevate her legs and this also helps.  She states that in the mornings, her leg is normal and not swollen.   She was hospitalized in December for uncontrolled blood pressure.  She states that when she had been home from the hospital, she had napped in her recliner like she would do every day and when she awoke, her right arm was painful and she developed bruising on her arm.  She states she did not  have any blood draws from that arm while hospitalized.  She takes a daily asa but no other blood thinners.     She continues to smoke but tells me she has cut back.    The pt is on a statin for cholesterol management.    The pt is on an aspirin.    Other AC:  none The pt is on CCB, ARB for hypertension.  The pt does  have diabetes. Tobacco hx:  current.  Pt does not have family hx of AAA.    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,  "small"   Anxiety    Arthritis    Cataract    CKD (chronic kidney disease), stage IV ([place])    followed by [facility]   Colon polyps    Diabetes mellitus 1998   Diverticulosis    Emphysema of lung ([place])    "no one has evry told me that"   ESOPHAGEAL STRICTURE 08/27/2008   Family history of adverse reaction to anesthesia    daughter n/v   GERD (gastroesophageal reflux disease)    Hemorrhoids    Hiatal hernia    Hyperlipidemia    Hypertension 2000   Migraines    Neuropathy    fingers and toe  Peripheral vascular disease ([place])    Phlebitis    30 years ago  left leg   Stroke [facility])    multiple mini strokes ( brain aneurysm ).  Right side weaker.    Past Surgical History:  Procedure Laterality Date   ABDOMINAL AORTAGRAM N/A 01/04/2012   Procedure: ABDOMINAL AORTAGRAM;  Surgeon: Serafina Mitchell, MD;  Location: [facility] CATH LAB;  Service: Cardiovascular;  Laterality: N/A;   ABDOMINAL AORTAGRAM N/A 08/15/2012   Procedure: ABDOMINAL Maxcine Ham;  Surgeon: Serafina Mitchell, MD;  Location: [facility] CATH LAB;  Service: Cardiovascular;  Laterality: N/A;   ABDOMINAL AORTOGRAM W/LOWER EXTREMITY Left 11/17/2021   Procedure: ABDOMINAL AORTOGRAM W/LOWER EXTREMITY;  Surgeon: Serafina Mitchell, MD;  Location: [place] CV LAB;  Service: Cardiovascular;  Laterality: Left;   ABDOMINAL AORTOGRAM W/LOWER EXTREMITY N/A 03/09/2022   Procedure: ABDOMINAL AORTOGRAM W/LOWER  EXTREMITY;  Surgeon: Serafina Mitchell, MD;  Location: [place] CV LAB;  Service: Cardiovascular;  Laterality: N/A;   ABDOMINAL HYSTERECTOMY  1984   cataract surgery Right 10-22-2015   cataract surgery Left 11-05-2015   CESAREAN SECTION     CHOLECYSTECTOMY     COLONOSCOPY  07/2010   DENTAL SURGERY  Aug. 16, 2013   left lower    ESOPHAGOGASTRODUODENOSCOPY  07/2010   EYE SURGERY  Nov. 2014   Laser-Glaucoma   FEMORAL ARTERY STENT  05/11/11   Left superficial femoral and popliteal artery   FEMORAL-POPLITEAL BYPASS GRAFT Left 11/18/2021   Procedure: LEFT FEMORAL-POPLITEAL ARTERY BYPASS GRAFT;  Surgeon: Serafina Mitchell, MD;  Location: [place];  Service: Vascular;  Laterality: Left;   HERNIA REPAIR     times two   KNEE SURGERY     LOWER EXTREMITY ANGIOGRAM Left 08/15/2012   Procedure: LOWER EXTREMITY ANGIOGRAM;  Surgeon: Serafina Mitchell, MD;  Location: [facility] CATH LAB;  Service: Cardiovascular;  Laterality: Left;  lt leg angio   PERIPHERAL VASCULAR BALLOON ANGIOPLASTY Left 03/09/2022   Procedure: PERIPHERAL VASCULAR BALLOON ANGIOPLASTY;  Surgeon: Serafina Mitchell, MD;  Location: [place] CV LAB;  Service: Cardiovascular;  Laterality: Left;   rotator cuff surgery     THYROID SURGERY      Allergies  Allergen Reactions   Ciprofloxacin Swelling   Ozempic (0.25 Or 0.5 Mg-Dose) [Semaglutide(0.25 Or 0.'5mg'$ -Dos)] Nausea And Vomiting   Plavix [Clopidogrel Bisulfate] Palpitations   Amoxicillin Itching and Swelling    FACE & EYES SWELL   Ace Inhibitors Cough    Sore throat   Atorvastatin     myalgias   Lyrica [Pregabalin] Itching and Nausea Only     gain weight   Penicillins Other (See Comments)    Pt unsure if there is an allergic reaction     Current Outpatient Medications  Medication Sig Dispense Refill   acetaminophen (TYLENOL) 500 MG tablet Take 1,000 mg by mouth every 6 (six) hours as needed for mild pain.     amLODipine (NORVASC) 10 MG tablet Take 1 tablet (10 mg total) by mouth  daily. for blood pressure 90 tablet 3   aspirin 325 MG EC tablet Take 325 mg by mouth at bedtime.     b complex vitamins capsule Take 2 capsules by mouth daily. (Patient not taking: Reported on 11/10/2022)     Blood Glucose Monitoring Suppl (ONETOUCH VERIO FLEX SYSTEM) w/Device KIT USE AS ADVISED 1 kit 0   busPIRone (BUSPAR) 5 MG tablet TAKE 1 TABLET BY MOUTH THREE TIMES A DAY (Patient taking differently: Take 5 mg by mouth  3 (three) times daily as needed (anxiety).) 270 tablet 1   chlorthalidone (HYGROTON) 25 MG tablet Take 1 tablet (25 mg total) by mouth every other day. (Patient not taking: Reported on 11/10/2022) 30 tablet 0   citalopram (CELEXA) 40 MG tablet TAKE 1 TABLET BY MOUTH EVERY DAY IN THE MORNING (Patient taking differently: Take 40 mg by mouth daily.) 90 tablet 2   Coenzyme Q10 (COQ10) 100 MG CAPS Take 100 mg by mouth daily. (Patient not taking: Reported on 11/10/2022)     dapagliflozin propanediol (FARXIGA) 10 MG TABS tablet Take by mouth daily.     empagliflozin (JARDIANCE) 25 MG TABS tablet Take 1 tablet (25 mg total) by mouth daily before breakfast. 90 tablet 1   Ginkgo Biloba 120 MG TABS Take 120 mg by mouth daily.     glucose blood test strip Use as instructed 3x a day - One Touch Verio 300 each 3   hydrALAZINE (APRESOLINE) 50 MG tablet Take 1 tablet (50 mg total) by mouth every 8 (eight) hours. (Patient not taking: Reported on 11/10/2022) 90 tablet 1   insulin aspart (NOVOLOG FLEXPEN) 100 UNIT/ML FlexPen Inject 10-14 Units into the skin 3 (three) times daily before meals. 45 mL 1   insulin degludec (TRESIBA) 100 UNIT/ML FlexTouch Pen Inject 40 Units into the skin daily. 30 mL 1   meclizine (ANTIVERT) 25 MG tablet 1/2 tab up to 3 times daily for motion sickness/dizziness. (Patient taking differently: Take 12.5 mg by mouth 3 (three) times daily as needed for dizziness. 1/2 tab up to 3 times daily for motion sickness/dizziness.) 30 tablet 0   nicotine (NICODERM CQ - DOSED IN MG/24  HOURS) 14 mg/24hr patch Place 1 patch (14 mg total) onto the skin daily. (Patient not taking: Reported on 09/06/2022) 28 patch 0   olmesartan (BENICAR) 40 MG tablet Take 1 tablet (40 mg total) by mouth daily. for blood pressure 90 tablet 3   Omega-3 Fatty Acids (FISH OIL) 1000 MG CAPS Take 1,000 mg by mouth every morning.     OneTouch Delica Lancets 99991111 MISC Use 3x a day 200 each 3   pantoprazole (PROTONIX) 40 MG tablet TAKE 1 TABLET BY MOUTH EVERY DAY 90 tablet 1   Polyethyl Glycol-Propyl Glycol (SYSTANE ULTRA OP) Place 1 drop into both eyes daily at 12 noon. Additional drop if needed for dry eyes     rOPINIRole (REQUIP) 0.25 MG tablet TAKE 1 TABLET BY MOUTH AT BEDTIME. 90 tablet 2   rosuvastatin (CRESTOR) 40 MG tablet Take 1 tablet (40 mg total) by mouth daily. 90 tablet 1   No current facility-administered medications for this visit.    Family History  Problem Relation Age of Onset   CAD Mother 34       Died of MI   Hypertension Mother    Heart attack Mother    Heart disease Mother    CAD Father 54       Died of MI   Heart disease Father    Heart attack Father    CAD Brother 74       Two brothers died of MI   Heart attack Brother    Heart disease Brother        Amputation   Diabetes Sister    Hypertension Sister    Heart attack Brother    Heart disease Brother    Stroke Sister    Colon cancer Neg Hx    Stomach cancer Neg Hx    Esophageal  cancer Neg Hx     Social History   Socioeconomic History   Marital status: Married    Spouse name: Not on file   Number of children: 2   Years of education: Not on file   Highest education level: Not on file  Occupational History   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 [place]  Tobacco Use   Smoking status: Light Smoker    Packs/day: 0.40    Years: 40.00    Total pack years: 16.00    Types: Cigarettes   Smokeless tobacco: Never   Tobacco comments:    11/18/20 in process of quiting  Vaping Use   Vaping Use: Never used   Substance and Sexual Activity   Alcohol use: No    Alcohol/week: 0.0 standard drinks of alcohol   Drug use: Never   Sexual activity: Not on file  Other Topics Concern   Not on file  Social History Narrative   Lives with husband.        Social Determinants of Health   Financial Resource Strain: Not on file  Food Insecurity: No Food Insecurity (08/28/2022)   Hunger Vital Sign    Worried About Running Out of Food in the Last Year: Never true    Ran Out of Food in the Last Year: Never true  Transportation Needs: No Transportation Needs (08/28/2022)   PRAPARE - Hydrologist (Medical): No    Lack of Transportation (Non-Medical): No  Physical Activity: Not on file  Stress: Not on file  Social Connections: Not on file  Intimate Partner Violence: Not At Risk (08/28/2022)   Humiliation, Afraid, Rape, and Kick questionnaire    Fear of Current or Ex-Partner: No    Emotionally Abused: No    Physically Abused: No    Sexually Abused: No     REVIEW OF SYSTEMS:   '[X]'$  denotes positive finding, '[ ]'$  denotes negative finding Cardiac  Comments:  Chest pain or chest pressure:    Shortness of breath upon exertion:    Short of breath when lying flat:    Irregular heart rhythm:        Vascular    Pain in calf, thigh, or hip brought on by ambulation:    Pain in feet at night that wakes you up from your sleep:     Blood clot in your veins:    Leg swelling:         Pulmonary    Oxygen at home:    Wheezing:         Neurologic    Sudden weakness in arms or legs:     Sudden numbness in arms or legs:     Sudden onset of difficulty speaking or understanding others    Temporary loss of vision in one eye:     Problems with dizziness:         Gastrointestinal    Blood in stool:     Vomited blood:         Genitourinary    Burning when urinating:     Blood in urine:        Psychiatric    Major depression:         Hematologic    Bleeding problems:     Problems with blood clotting too easily:        Skin    Rashes or ulcers:        Constitutional    Fever or chills:  PHYSICAL EXAMINATION:  Today's Vitals   11/15/22 1025  BP: (!) 194/74  Pulse: 65  Resp: 20  SpO2: 95%  Weight: 191 lb 3.2 oz (86.7 kg)  Height: '5\' 6"'$  (1.676 m)   Body mass index is 30.86 kg/m.   General:  WDWN in NAD; vital signs documented above Gait: Not observed HENT: WNL, normocephalic Pulmonary: normal non-labored breathing  Cardiac: regular HR;  with carotid bruits bilaterally Abdomen: soft, NT, aortic pulse is not palpable Skin: without rashes Vascular Exam/Pulses:  Right Left  Radial 1+ (weak) 2+ (normal)  DP Monophasic  Brisk multiphasic  PT monophasic Brisk multiphasic  Peroneal monophasic Brisk multiphasic   Extremities: mild pitting edema LLE; no wounds present on either foot.  Musculoskeletal: no muscle wasting or atrophy  Neurologic: A&O X 3;  speech is fluent/normal Psychiatric:  The pt has Normal affect.   Non-Invasive Vascular Imaging:   ABI's/TBI's on 11/15/2022: Right:  0.50/0.35 - great toe pressure:  72 Left:  0.91/0.55 - great toe pressure:  113   Arterial duplex on 11/15/2022: Left Graft #1: CFA- below knee popliteal artery  +--------------------+--------+---------------+--------+-------------------                     PSV cm/sStenosis       WaveformComments            +--------------------+--------+---------------+--------+-------------------  Inflow             272     50-74% stenosisbiphasic                    +--------------------+--------+---------------+--------+-------------------   Proximal Anastomosis173       biphasicvein measures 1.7 x  1.6cm at anastomosis     +--------------------+--------+---------------+--------+-------------------  Proximal Graft      58                     biphasic                    +--------------------+--------+---------------+--------+-------------------   Mid Graft           81                     biphasic                    +--------------------+--------+---------------+--------+-------------------  Distal Graft        360     >70% stenosis  biphasic                    +--------------------+--------+---------------+--------+-------------------  Distal Anastomosis  113                    biphasic                    +--------------------+--------+---------------+--------+-------------------  Outflow            91                     biphasic                    +--------------------+--------+---------------+--------+-------------------   Summary:  Left: Patent left femoral-below knee popliteal artery bypass graft with evidence of elevated velocitites suggestive of 50-74% stenosis at the inflow artery, and >70% stenosis at the distal graft.   Non-Invasive Vascular Imaging:   Carotid Duplex on 11/15/2022: Right:  1-39% ICA stenosis Left:  1-39% ICA stenosis -elevated velocities in the  CCA on the left.  Previous ABI's/TBI's on 08/09/2022: Right:  0.41/0.32 - great toe pressure:  63 Left:  0.81/0.53 - great toe pressure:  103  Previous arterial duplex 08/09/2022: Left Graft #1: Left femoral-popliteal artery bypass  +--------------------+--------+---------------+---------+--------+                     PSV cm/sStenosis       Waveform Comments  +--------------------+--------+---------------+---------+--------+  Inflow             393     50-74% stenosisbiphasic           +--------------------+--------+---------------+---------+--------+  Proximal Anastomosis233     50-70% stenosistriphasic          +--------------------+--------+---------------+---------+--------+  Proximal Graft      242     50-70% stenosisbiphasic           +--------------------+--------+---------------+---------+--------+  Mid Graft           83                     biphasic            +--------------------+--------+---------------+---------+--------+  Distal Graft        173                    biphasic           +--------------------+--------+---------------+---------+--------+  Distal Anastomosis  193                    triphasic          +--------------------+--------+---------------+---------+--------+  Outflow            148                    biphasic           +--------------------+--------+---------------+---------+--------+   Summary:  Left: Left femoral-popliteal artery bypass graft is patent with 50-74%  stenosis in the LT CFA, 50-70% stenosis in the LT proximal graft  anastomosis, and proximal graft.   Previous Carotid duplex on 10/06/2020: Right: 1-39% ICA stenosis Left:   1-39% ICA stenosis; >50% stenosis CCA    ASSESSMENT/PLAN:: 77 y.o. female here for follow up for PAD and carotid stenosis with hx of left SFA stenting in 2012 which ultimately failed. She then required a left femoral to below the knee popliteal bypass with vein by Dr. Trula Slade on 11/18/2021 due to left ankle wound. Postoperatively she developed stenosis at the distal anastomosis which required drug-coated balloon angioplasty on 03/09/2022 by Dr. Trula Slade.   PAD -pt has elevated velocity at the distal anastomosis of 360cm/s.  The pt does not have any rest pain, claudication, non healing wounds. Pt has CKD 4, therefore, will have her return in 3 months and re-evaluate her bypass graft.  -continue graduated walking program -continue asa/statin  Left COMMON Carotid stenosis -duplex today reveals increased velocities in the left CCA compared with January 2022.  A CTA of the head and neck would be beneficial, but given her CKD 4 with her last creatinine of 2.45, will hold on this.  Discussed with Dr. Trula Slade and will have pt get carotid duplex in 3 months when she returns.   -discussed s/s of stroke with pt and she understands should she develop any of these sx, she will go to the  nearest ER or call 911.  Uncontrolled hypertension -pt recently hospitalized for uncontrolled hypertension.  Pt had aortogram in June 2023 and there was  no evidence of renal artery stenosis.    Current smoker -discussed importance of smoking cessation especially in light of her being diabetic and that she is at increased risk of limb loss, MI, stroke.  She has cut back and I encouraged her to continue to work on this.   -continue [place], [facility] [phone]  Clinic MD:   Trula Slade

## 2022-11-15 ENCOUNTER — Ambulatory Visit (HOSPITAL_COMMUNITY)
Admission: RE | Admit: 2022-11-15 | Discharge: 2022-11-15 | Disposition: A | Discharge status: Home / Self Care | Payer: Medicare Other | Source: Ambulatory Visit | Attending: Surgery | Admitting: Surgery

## 2022-11-15 ENCOUNTER — Ambulatory Visit (INDEPENDENT_AMBULATORY_CARE_PROVIDER_SITE_OTHER): Payer: Medicare Other | Admitting: Physician Assistant

## 2022-11-15 ENCOUNTER — Telehealth: Payer: Self-pay

## 2022-11-15 ENCOUNTER — Ambulatory Visit (INDEPENDENT_AMBULATORY_CARE_PROVIDER_SITE_OTHER)
Admission: RE | Admit: 2022-11-15 | Discharge: 2022-11-15 | Disposition: A | Discharge status: Home / Self Care | Payer: Medicare Other | Source: Ambulatory Visit | Attending: Surgery | Admitting: Surgery

## 2022-11-15 ENCOUNTER — Encounter: Payer: Self-pay | Admitting: Physician Assistant

## 2022-11-15 VITALS — BP 194/74 | HR 65 | Resp 20 | Ht 66.0 in | Wt 191.2 lb

## 2022-11-15 DIAGNOSIS — Z8673 Personal history of transient ischemic attack (TIA), and cerebral infarction without residual deficits: Secondary | ICD-10-CM | POA: Diagnosis not present

## 2022-11-15 DIAGNOSIS — E1122 Type 2 diabetes mellitus with diabetic chronic kidney disease: Secondary | ICD-10-CM | POA: Insufficient documentation

## 2022-11-15 DIAGNOSIS — E785 Hyperlipidemia, unspecified: Secondary | ICD-10-CM

## 2022-11-15 DIAGNOSIS — I6522 Occlusion and stenosis of left carotid artery: Secondary | ICD-10-CM | POA: Diagnosis not present

## 2022-11-15 DIAGNOSIS — I70243 Atherosclerosis of 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: Secondary | ICD-10-CM | POA: Diagnosis not present

## 2022-11-15 DIAGNOSIS — I739 Peripheral vascular disease, unspecified: Secondary | ICD-10-CM | POA: Diagnosis not present

## 2022-11-15 DIAGNOSIS — N184 Chronic kidney disease, stage 4 (severe): Secondary | ICD-10-CM | POA: Diagnosis not present

## 2022-11-15 DIAGNOSIS — I6523 Occlusion and stenosis of bilateral carotid arteries: Secondary | ICD-10-CM | POA: Diagnosis not present

## 2022-11-15 DIAGNOSIS — E1169 Type 2 diabetes mellitus with other specified complication: Secondary | ICD-10-CM

## 2022-11-15 DIAGNOSIS — I160 Hypertensive urgency: Secondary | ICD-10-CM | POA: Diagnosis not present

## 2022-11-15 DIAGNOSIS — F17200 Nicotine dependence, unspecified, uncomplicated: Secondary | ICD-10-CM

## 2022-11-15 DIAGNOSIS — R42 Dizziness and giddiness: Secondary | ICD-10-CM

## 2022-11-15 LAB — VAS US ABI WITH/WO TBI
Left ABI: 0.91
Right ABI: 0.5

## 2022-11-15 NOTE — Progress Notes (Signed)
Chart prep for focused outreach started, completed and uploaded to [employer] for CPP review. Reviewed office visits, specialists visits, hospital visits, medications, adherence and labs.Left detailed voicemail and to return call.   Pre call questions   Confirm visit date: 11/16/22, 3-5pm    Have you seen any providers since your last visit or any hospitalizations?  PCP  -08/04/22- Lorenda Peck, NP-BP 140/60, HR 63. Stop Omeprazole and begin Pantoprazole '40mg'$ , 1 tab once daily. Start Buspirone '5mg'$ , 1 tab 3 times daily. Take plain Mucinex daily for 7 days and Zyrtec or Allegra for allergies.   -09/06/22-Tonya Cranford, NP-BP 128/46, HR 69.  Take additional 2.'5mg'$  of Buspirone if needed in the morning.   -11/10/22-Tonya Cranford, NP-BP 160/82, HR 68. No med changes.   Specialists  -08/09/22-Eveland Rodman Key, PA-C(Vascular & Vein)-Virtual visit-No med changes.   -09/27/22-Dr. Gherghe(Endocrinology)-BP 124/82, HR 54. Hold Farxiga until cleared by nephrology.   Hospital  08/27/22-08/30/22-[facility]-Start Nicotine '14mg'$ /24hr patch, 1 patch onto skin daily.     Have there been any changes to your medications since your last visit? (Med. Adherence, care gaps)  -Amlodipine '10mg'$ -04/15/22 (90 DS), last reported taking 11/10/22.  -Olmesartan '40mg'$ -04/15/22 (90 DS), last reported taking 11/10/22.  -Rosuvastatin '40mg'$ -10/08/22 (90 DS)    Have you had any problems with getting your medications from your pharmacy? (Copay barriers)  -None    What is your top health concern you'd like to discuss for your upcoming visit?  -None  Time Spent: 36mn  EHildred Alamin CTL

## 2022-11-17 ENCOUNTER — Other Ambulatory Visit: Payer: Self-pay

## 2022-11-17 DIAGNOSIS — I70243 Atherosclerosis of 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: Secondary | ICD-10-CM

## 2022-11-17 DIAGNOSIS — I70213 Atherosclerosis of native arteries of extremities with intermittent claudication, bilateral legs: Secondary | ICD-10-CM

## 2022-11-17 DIAGNOSIS — I6523 Occlusion and stenosis of bilateral carotid arteries: Secondary | ICD-10-CM

## 2022-11-17 DIAGNOSIS — I739 Peripheral vascular disease, unspecified: Secondary | ICD-10-CM

## 2022-11-22 ENCOUNTER — Ambulatory Visit: Payer: Medicare Other | Admitting: Internal Medicine

## 2022-11-29 ENCOUNTER — Other Ambulatory Visit: Payer: Self-pay | Admitting: Internal Medicine

## 2022-11-29 ENCOUNTER — Encounter: Payer: Self-pay | Admitting: Internal Medicine

## 2022-11-29 DIAGNOSIS — E1122 Type 2 diabetes mellitus with diabetic chronic kidney disease: Secondary | ICD-10-CM

## 2022-11-29 MED ORDER — INSULIN DEGLUDEC 100 UNIT/ML ~~LOC~~ SOPN: 40.0000 [IU] | PEN_INJECTOR | Freq: Every day | SUBCUTANEOUS | 3 refills | Status: DC

## 2022-12-02 MED ORDER — TRESIBA FLEXTOUCH 200 UNIT/ML ~~LOC~~ SOPN: 40.0000 [IU] | PEN_INJECTOR | Freq: Every day | SUBCUTANEOUS | 3 refills | Status: DC

## 2022-12-06 ENCOUNTER — Encounter: Payer: Self-pay | Admitting: Internal Medicine

## 2023-01-04 DIAGNOSIS — H1130 Conjunctival hemorrhage, unspecified eye: Secondary | ICD-10-CM | POA: Diagnosis not present

## 2023-01-04 DIAGNOSIS — H04123 Dry eye syndrome of bilateral lacrimal glands: Secondary | ICD-10-CM | POA: Diagnosis not present

## 2023-01-04 DIAGNOSIS — H04223 Epiphora due to insufficient drainage, bilateral lacrimal glands: Secondary | ICD-10-CM | POA: Diagnosis not present

## 2023-01-04 DIAGNOSIS — H40023 Open angle with borderline findings, high risk, bilateral: Secondary | ICD-10-CM | POA: Diagnosis not present

## 2023-01-04 DIAGNOSIS — H35033 Hypertensive retinopathy, bilateral: Secondary | ICD-10-CM | POA: Diagnosis not present

## 2023-01-04 LAB — HM DIABETES EYE EXAM

## 2023-01-05 ENCOUNTER — Encounter: Payer: Self-pay | Admitting: Internal Medicine

## 2023-01-10 DIAGNOSIS — D631 Anemia in chronic kidney disease: Secondary | ICD-10-CM | POA: Diagnosis not present

## 2023-01-10 DIAGNOSIS — I129 Hypertensive chronic kidney disease with stage 1 through stage 4 chronic kidney disease, or unspecified chronic kidney disease: Secondary | ICD-10-CM | POA: Diagnosis not present

## 2023-01-10 DIAGNOSIS — N184 Chronic kidney disease, stage 4 (severe): Secondary | ICD-10-CM | POA: Diagnosis not present

## 2023-01-10 DIAGNOSIS — E1122 Type 2 diabetes mellitus with diabetic chronic kidney disease: Secondary | ICD-10-CM | POA: Diagnosis not present

## 2023-01-10 DIAGNOSIS — N2581 Secondary hyperparathyroidism of renal origin: Secondary | ICD-10-CM | POA: Diagnosis not present

## 2023-02-07 ENCOUNTER — Encounter: Payer: Self-pay | Admitting: Internal Medicine

## 2023-02-07 ENCOUNTER — Other Ambulatory Visit: Payer: Medicare Other

## 2023-02-07 ENCOUNTER — Ambulatory Visit (INDEPENDENT_AMBULATORY_CARE_PROVIDER_SITE_OTHER): Payer: Medicare Other | Admitting: Internal Medicine

## 2023-02-07 VITALS — BP 130/76 | HR 70 | Ht 66.0 in | Wt 190.0 lb

## 2023-02-07 DIAGNOSIS — N184 Chronic kidney disease, stage 4 (severe): Secondary | ICD-10-CM | POA: Diagnosis not present

## 2023-02-07 DIAGNOSIS — Z794 Long term (current) use of insulin: Secondary | ICD-10-CM

## 2023-02-07 DIAGNOSIS — E785 Hyperlipidemia, unspecified: Secondary | ICD-10-CM

## 2023-02-07 DIAGNOSIS — E669 Obesity, unspecified: Secondary | ICD-10-CM

## 2023-02-07 DIAGNOSIS — E119 Type 2 diabetes mellitus without complications: Secondary | ICD-10-CM

## 2023-02-07 DIAGNOSIS — E1122 Type 2 diabetes mellitus with diabetic chronic kidney disease: Secondary | ICD-10-CM | POA: Diagnosis not present

## 2023-02-07 DIAGNOSIS — E1169 Type 2 diabetes mellitus with other specified complication: Secondary | ICD-10-CM

## 2023-02-07 DIAGNOSIS — Z7984 Long term (current) use of oral hypoglycemic drugs: Secondary | ICD-10-CM | POA: Diagnosis not present

## 2023-02-07 LAB — POCT GLYCOSYLATED HEMOGLOBIN (HGB A1C): Hemoglobin A1C: 7.7 % — AB (ref 4.0–5.6)

## 2023-02-07 MED ORDER — NOVOLOG FLEXPEN 100 UNIT/ML ~~LOC~~ SOPN: 10.0000 [IU] | PEN_INJECTOR | Freq: Three times a day (TID) | SUBCUTANEOUS | 3 refills | Status: DC

## 2023-02-07 MED ORDER — DAPAGLIFLOZIN PROPANEDIOL 10 MG PO TABS: 10.0000 mg | ORAL_TABLET | Freq: Every day | ORAL | 3 refills | Status: DC

## 2023-02-07 NOTE — Patient Instructions (Addendum)
Please  continue: - Farxiga 10 mg in am - Tresiba 40 units daily - Novolog: 10-14 units 2x day before meals 4-5 units for correction of a blood sugars >200    Start checking sugars more frequently later in the day.    Please return in 4 months with your sugar log.

## 2023-02-07 NOTE — Progress Notes (Signed)
Patient ID: [name], female   DOB: 07-Aug-1946, 77 y.o.   MRN: [id number]   HPI: [name] is a 77 y.o.-year-old female, initially referred by her cardiologist, Dr.Hochrein, presenting for follow-up for DM2, dx in ~2000, insulin-dependent since ~2005, uncontrolled, with complications (PVD - s/p stents and fem-pop bypass; h/o CVA x3; CKD stage 3b; DR; PN). She saw my colleague, Dr. Everardo All many years ago.  Last visit with me 4 months ago. PCP: Quentin Mulling, PA  Interim history: No increased urination, nausea, chest pain. She has pressure in the pelvic area when going to the restroom but no pain/burning/urgency.  Reviewed HbA1c levels: Lab Results  Component Value Date   HGBA1C 7.6 (H) 08/04/2022   HGBA1C 7.6 (A) 05/31/2022   HGBA1C 7.6 (A) 01/22/2022   HGBA1C 7.1 (H) 11/17/2021   HGBA1C 8.5 (H) 08/04/2021   HGBA1C 8.7 (A) 06/08/2021   HGBA1C 8.4 (H) 12/15/2020   HGBA1C 8.7 (A) 09/22/2020   HGBA1C 9.6 (A) 05/21/2020   HGBA1C 9.8 (A) 01/14/2020  05/31/2022: HbA1c calc. From fructosamine 6.5% 01/26/2021: HbA1c calculated from fructosamine is 7.47%, higher. 09/22/2020: HbA1c calculated from fructosamine is much better than the directly measured HbA1c, at 6.25% 05/21/2020: HbA1c calculated from fructosamine 7.3%, much better than the directly measured one, and stable from before 01/14/2020: HbA1c calculated from fructosamine 7.3% 10/08/2019: HbA1c calculated from fructosamine: 6.9%  She was previously on: - Novolog 70/30 35 units before breakfast and 30 units before dinner- injecting hips and arms due to abdominal pain - Farxiga 5 mg in am - started 06/2019 >> 10 mg daily - increased 09/2019  Currently on: - Farxiga 10 mg daily before b'fast - held at our visit from 09/2022 - Tresiba 40 units daily - Novolog: 10-14 units 2x day before meals >> using 14 units for all meal   She could not tolerate Ozempic (started 11/2018) due to nausea, vomiting, abdominal pain-stopped  04/2019. She was on Invokana >> intolerance 2/2 weight loss and dehydration >> hospitalization. She was on Metformin and sulfonylurea at diagnosis. She was also on Januvia at the same time with Invokana >> stopped along with Invokana.  Pt was checking sugars 2-3x a day - am:  121-140 >> 71, 86-114 >> 68, 72-121, 144 >> 82-102 >> 79-105 - 2h after b'fast: 326 >> n/c >> 326 (10/2019) >> n/c - before lunch: 265 >> 124-172 >> n/c >> 80s-100s >> n/c - 2h after lunch: 280, 286 >> n/c >> 250 >> n/c - before dinner: 73-193, 215 >> n/c >> 180 >> <140 >> 76 >> n/c - 2h after dinner: 247 >> 196- 201 >> 172 >> n/c  - bedtime: n/c >> 178 - nighttime: 68  - seldom >> 57 (took Novolog and skipped dinner!!!), 129 >> n/c Lowest sugar was57 >> 82 >> 79; she has hypoglycemia awareness at 80. Highest sugar was 335 (no medications) >> .Marland KitchenMarland Kitchen 100s >> 119.  Glucometer: Livongo >> One Touch Verio  Pt's meals are: - Breakfast: eggs or cheese half a sandwich - Lunch: half a sandwich, apple - Dinner 9-9:30 pm: chicken, veggies - Snacks: stopped cheese; now apples and pickles, bedtime snack: orange or apple  She works part-time, from 10 am - 2 pm Tue-Fri.   -+ CKD stage IIIb (Dr. Thedore Mins), last BUN/creatinine:  12/2022: GFR 23 09/27/2022: 40/2.45, GFR 20, glucose 145, protein to creatinine ratio 4028 (0-200) Lab Results  Component Value Date   BUN 39 (H) 09/06/2022   BUN 34 (H) 08/30/2022  CREATININE 2.45 (H) 09/06/2022   CREATININE 2.34 (H) 08/30/2022  09/06/2022, GFR 20 Lab Results  Component Value Date   GFRAA 32 (L) 12/15/2020   GFRAA 25 (L) 08/04/2020   GFRAA 23 (L) 07/31/2020   GFRAA 22 (L) 07/30/2020   GFRAA 31 (L) 10/18/2019  On olmesartan 40.  -+ HL; last set of lipids: Lab Results  Component Value Date   CHOL 152 08/04/2022   HDL 54 08/04/2022   LDLCALC 74 08/04/2022   LDLDIRECT 146.4 01/30/2010   TRIG 154 (H) 08/04/2022   CHOLHDL 2.8 08/04/2022  On Crestor 40 mg daily, omega-3 fatty  acids 1200 mg daily.  - last eye exam was: 01/05/2023: + DR; prev. + DR. She also has glaucoma. Dr. [place] Frasier at [facility] care.  -+ Numbness and tingling in her feet.  She has a callus on the left foot.  She sees a podiatrist.  She did not tolerate Lyrica.  Last foot exam 05/31/2022, she had an infected ischemic ulcer due to arterial insufficiency after injury to her left foot in 09/2021.  She had left femoropopliteal bypass graft 11/18/2021.  Afterwards, she had a balloon angioplasty >> L blood flow improved >> she got back feeling in her toes.  On ASA 81.  Pt has FH of DM in sister.  She also has a history of nontoxic multinodular goiter.  Latest TSH was reviewed and this was normal: Lab Results  Component Value Date   TSH 3.915 08/28/2022   She also has a history of HTN, brain aneurysm, emphysema, esophageal stricture, GERD.  She had hernia repair surgery- had mesh placed >> persistent abdominal pain. She started to take a supplement and vitamins in 09/2021 (by [facility] [place]) - sugars started to improve afterwards. She was admitted 12/8-07/2022 With chest pain, dizziness, shortness of breath-hypertensive urgency (sBP 200) - considered due to stress and anxiety.    Her husband's nephew, Fransisca Connors, was also my patient and he died at the beginning of March 05, 2020 after he stopped going to dialysis.  ROS: + see HPI  I reviewed pt's medications, allergies, PMH, social hx, family hx, and changes were documented in the history of present illness. Otherwise, unchanged from my initial visit note.  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,  "small"   Anxiety    Arthritis    Cataract    CKD (chronic kidney disease), stage IV (HCC)    followed by [facility]   Colon polyps    Diabetes mellitus 03/05/97   Diverticulosis    Emphysema of lung (HCC)    "no one has evry  told me that"   ESOPHAGEAL STRICTURE 08/27/2008   Family history of adverse reaction to anesthesia    daughter n/v   GERD (gastroesophageal reflux disease)    Hemorrhoids    Hiatal hernia    Hyperlipidemia    Hypertension 2000   Migraines    Neuropathy    fingers and toe   Peripheral vascular disease (HCC)    Phlebitis    30 years ago  left leg   Stroke [facility])    multiple mini strokes ( brain aneurysm ).  Right side weaker.   Past Surgical History:  Procedure Laterality Date   ABDOMINAL AORTAGRAM N/A 01/04/2012   Procedure: ABDOMINAL AORTAGRAM;  Surgeon: Nada Libman, MD;  Location[facility] C[facility] LAB;  Service: Cardiovascular;  Laterality: N/A;   ABDOMINAL AORTAGRAM N/A 08/15/2012   Procedure: ABDOMINAL AORTAGRAM;  Surgeon: Nada Libman, MD;  Location: [facility] CATH LAB;  Service: Cardiovascular;  Laterality: N/A;   ABDOMINAL AORTOGRAM W/LOWER EXTREMITY Left 11/17/2021   Procedure: ABDOMINAL AORTOGRAM W/LOWER EXTREMITY;  Surgeon: Nada Libman, MD;  Location: [facility] INVASIVE CV LAB;  Service: Cardiovascular;  Laterality: Left;   ABDOMINAL AORTOGRAM W/LOWER EXTREMITY N/A 03/09/2022   Procedure: ABDOMINAL AORTOGRAM W/LOWER EXTREMITY;  Surgeon: Nada Libman, MD;  Location: [facility] INVASIVE CV LAB;  Service: Cardiovascular;  Laterality: N/A;   ABDOMINAL HYSTERECTOMY  1984   cataract surgery Right 10-22-2015   cataract surgery Left 11-05-2015   CESAREAN SECTION     CHOLECYSTECTOMY     COLONOSCOPY  07/2010   DENTAL SURGERY  Aug. 16, 2013   left lower    ESOPHAGOGASTRODUODENOSCOPY  07/2010   EYE SURGERY  Nov. 2014   Laser-Glaucoma   FEMORAL ARTERY STENT  05/11/11   Left superficial femoral and popliteal artery   FEMORAL-POPLITEAL BYPASS GRAFT Left 11/18/2021   Procedure: LEFT FEMORAL-POPLITEAL ARTERY BYPASS GRAFT;  Surgeon: Nada Libman, MD;  Location: [facility] OR;  Service: Vascular;  Laterality: Left;   HERNIA REPAIR     times two   KNEE SURGERY     LOWER EXTREMITY ANGIOGRAM Left 08/15/2012    Procedure: LOWER EXTREMITY ANGIOGRAM;  Surgeon: Nada Libman, MD;  [facility] C[facility] LAB;  Service: Cardiovascular;  Laterality: Left;  lt leg angio   PERIPHERAL VASCULAR BALLOON ANGIOPLASTY Left 03/09/2022   Procedure: PERIPHERAL VASCULAR BALLOON ANGIOPLASTY;  Surgeon: Nada Libman, MD;  Location: [facility] INVASIVE CV LAB;  Service: Cardiovascular;  Laterality: Left;   rotator cuff surgery     THYROID SURGERY     Social History   Socioeconomic History   Marital status: Married    Spouse name: Not on file   Number of children: 2   Years of education: Not on file   Highest education level: Not on file  Occupational History   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  Tobacco Use   Smoking status: Light Smoker    Packs/day: 0.40    Years: 40.00    Additional pack years: 0.00    Total pack years: 16.00    Types: Cigarettes    Passive exposure: Never   Smokeless tobacco: Never   Tobacco comments:    11/18/20 in process of quiting  Vaping Use   Vaping Use: Never used  Substance and Sexual Activity   Alcohol use: No    Alcohol/week: 0.0 standard drinks of alcohol   Drug use: Never   Sexual activity: Not on file  Other Topics Concern   Not on file  Social History Narrative   Lives with husband.        Social Determinants of Health   Financial Resource Strain: Not on file  Food Insecurity: No Food Insecurity (08/28/2022)   Hunger Vital Sign    Worried About Running Out of Food in the Last Year: Never true    Ran Out of Food in the Last Year: Never true  Transportation Needs: No Transportation Needs (08/28/2022)   PRAPARE - Administrator, Civil Service (Medical): No    Lack of Transportation (Non-Medical): No  Physical Activity: Not on file  Stress: Not on file  Social Connections: Not on file  Intimate Partner Violence: Not At Risk (08/28/2022)   Humiliation, Afraid, Rape, and Kick questionnaire    Fear of  Current or Ex-Partner: No    Emotionally  Abused: No    Physically Abused: No    Sexually Abused: No   Current Outpatient Medications on File Prior to Visit  Medication Sig Dispense Refill   insulin degludec (TRESIBA FLEXTOUCH) 200 UNIT/ML FlexTouch Pen Inject 40 Units into the skin daily. 18 mL 3   acetaminophen (TYLENOL) 500 MG tablet Take 1,000 mg by mouth every 6 (six) hours as needed for mild pain.     amLODipine (NORVASC) 10 MG tablet Take 1 tablet (10 mg total) by mouth daily. for blood pressure 90 tablet 3   aspirin 325 MG EC tablet Take 325 mg by mouth at bedtime.     b complex vitamins capsule Take 2 capsules by mouth daily. (Patient not taking: Reported on 11/10/2022)     Blood Glucose Monitoring Suppl (ONETOUCH VERIO FLEX SYSTEM) w/Device KIT USE AS ADVISED 1 kit 0   busPIRone (BUSPAR) 5 MG tablet TAKE 1 TABLET BY MOUTH THREE TIMES A DAY (Patient taking differently: Take 5 mg by mouth 3 (three) times daily as needed (anxiety).) 270 tablet 1   chlorthalidone (HYGROTON) 25 MG tablet Take 1 tablet (25 mg total) by mouth every other day. (Patient not taking: Reported on 11/10/2022) 30 tablet 0   citalopram (CELEXA) 40 MG tablet TAKE 1 TABLET BY MOUTH EVERY DAY IN THE MORNING (Patient taking differently: Take 40 mg by mouth daily.) 90 tablet 2   Coenzyme Q10 (COQ10) 100 MG CAPS Take 100 mg by mouth daily. (Patient not taking: Reported on 11/10/2022)     dapagliflozin propanediol (FARXIGA) 10 MG TABS tablet Take by mouth daily.     Ginkgo Biloba 120 MG TABS Take 120 mg by mouth daily.     glucose blood test strip Use as instructed 3x a day - One Touch Verio 300 each 3   hydrALAZINE (APRESOLINE) 50 MG tablet Take 1 tablet (50 mg total) by mouth every 8 (eight) hours. (Patient not taking: Reported on 11/10/2022) 90 tablet 1   insulin aspart (NOVOLOG FLEXPEN) 100 UNIT/ML FlexPen Inject 10-14 Units into the skin 3 (three) times daily before meals. 45 mL 1   meclizine (ANTIVERT) 25 MG tablet 1/2 tab up to 3 times daily for motion  sickness/dizziness. (Patient taking differently: Take 12.5 mg by mouth 3 (three) times daily as needed for dizziness. 1/2 tab up to 3 times daily for motion sickness/dizziness.) 30 tablet 0   nicotine (NICODERM CQ - DOSED IN MG/24 HOURS) 14 mg/24hr patch Place 1 patch (14 mg total) onto the skin daily. (Patient not taking: Reported on 09/06/2022) 28 patch 0   olmesartan (BENICAR) 40 MG tablet Take 1 tablet (40 mg total) by mouth daily. for blood pressure 90 tablet 3   Omega-3 Fatty Acids (FISH OIL) 1000 MG CAPS Take 1,000 mg by mouth every morning.     OneTouch Delica Lancets 33G MISC Use 3x a day 200 each 3   pantoprazole (PROTONIX) 40 MG tablet TAKE 1 TABLET BY MOUTH EVERY DAY 90 tablet 1   Polyethyl Glycol-Propyl Glycol (SYSTANE ULTRA OP) Place 1 drop into both eyes daily at 12 noon. Additional drop if needed for dry eyes     rOPINIRole (REQUIP) 0.25 MG tablet TAKE 1 TABLET BY MOUTH AT BEDTIME. 90 tablet 2   rosuvastatin (CRESTOR) 40 MG tablet Take 1 tablet (40 mg total) by mouth daily. 90 tablet 1   No current facility-administered medications on file prior to visit.   Allergies  Allergen  Reactions   Ciprofloxacin Swelling   Ozempic (0.25 Or 0.5 Mg-Dose) [Semaglutide(0.25 Or 0.5mg -Dos)] Nausea And Vomiting   Plavix [Clopidogrel Bisulfate] Palpitations   Amoxicillin Itching and Swelling    FACE & EYES SWELL   Ace Inhibitors Cough    Sore throat   Atorvastatin     myalgias   Lyrica [Pregabalin] Itching and Nausea Only     gain weight   Penicillins Other (See Comments)    Pt unsure if there is an allergic reaction    Family History  Problem Relation Age of Onset   CAD Mother 26       Died of MI   Hypertension Mother    Heart attack Mother    Heart disease Mother    CAD Father 10       Died of MI   Heart disease Father    Heart attack Father    CAD Brother 63       Two brothers died of MI   Heart attack Brother    Heart disease Brother        Amputation   Diabetes Sister     Hypertension Sister    Heart attack Brother    Heart disease Brother    Stroke Sister    Colon cancer Neg Hx    Stomach cancer Neg Hx    Esophageal cancer Neg Hx    PE: BP 130/76 (BP Location: Left Arm, Patient Position: Sitting, Cuff Size: Normal)   Pulse 70   Ht 5\' 6"  (1.676 m)   Wt 190 lb (86.2 kg)   SpO2 98%   BMI 30.67 kg/m  Wt Readings from Last 3 Encounters:  02/07/23 190 lb (86.2 kg)  11/15/22 191 lb 3.2 oz (86.7 kg)  11/10/22 191 lb 9.6 oz (86.9 kg)   Constitutional: overweight, in NAD Eyes:  EOMI, no exophthalmos ENT: no neck masses, no cervical lymphadenopathy Cardiovascular: RRR, No MRG Respiratory: CTA Musculoskeletal: no deformities Skin:no rashes Neurological: no tremor with outstretched hands  ASSESSMENT: 1. DM2, insulin-dependent, uncontrolled, with complications - PVD - s/p stents and fem-pop bypass - h/o CVA - CKD stage 3b - DR - PN  Her antipancreatic antibodies were negative: Component     Latest Ref Rng & Units 07/22/2016  Glutamic Acid Decarb Ab     <5 IU/mL <5   2.  Hyperlipidemia  3.  Obesity class 1  PLAN:  1. Patient with longstanding, uncontrolled, type 2 diabetes, previously on the premixed insulin regimen but now on basal bolus insulin regimen and also SGLT2 inhibitor, which we held at last visit due to the low GFR ( however, she is now back on Farxiga). She could not tolerate a GLP-1 receptor agonist (Ozempic) due to GI symptoms.  She refused to try Trulicity.  She also declined a V-Go patch pump.  At last visit, HbA1c was 7.6%, higher.  Sugars were well-controlled, however.  I advised him to discuss with nephrology to see if we can continue [place] but otherwise we did not change the regimen. -At today's visit, sugars are at goal in the morning, excellent, however, she is not checking sugars later in the day.  I strongly advised her to start checking some sugars later in the day, before meals, and 1 mg in the morning and occasionally  at bedtime.  She is only taking 14 units of NovoLog before each meal and we discussed that ideally she would vary the dose based on the size of her meals and blood  sugars before the meals.  Otherwise, we can continue the current regimen.  I refilled her Marcelline Deist and NovoLog for her. - I suggested to:  Patient Instructions  Please  continue: - Farxiga 10 mg in am - Tresiba 40 units daily - Novolog: 10-14 units 2x day before meals 4-5 units for correction of a blood sugars >200    Start checking sugars more frequently later in the day.    Please return in 4 months with your sugar log.  Bring  - we checked her HbA1c: 7.7%, however, we will also check a fructosamine level - advised to check sugars at different times of the day - 4x a day, rotating check times - advised for yearly eye exams >> she is UTD - return to clinic in 4 months  2. HL -Latest lipid panel from 07/2022 reviewed: LDL above target at 55, however, approximately 50% of the initial LDL: Lab Results  Component Value Date   CHOL 152 08/04/2022   HDL 54 08/04/2022   LDLCALC 74 08/04/2022   LDLDIRECT 146.4 01/30/2010   TRIG 154 (H) 08/04/2022   CHOLHDL 2.8 08/04/2022  -On Crestor 40 mg daily, omega-3 fatty acids 1200 mg daily, without side effects.  3.  Obesity class 1 -Unfortunately we could not continue a GLP-1 receptor agonist due to intolerance -At last visit I advised her to hold the SGLT2 inhibitor until she discussed with nephrology, since her GFR is 20 -She gained 8 pounds before the last 2 visits, previously lost 8 -Weight is approximately stable now  Carlus Pavlov, MD PhD [facility] Endocrinology

## 2023-02-10 LAB — FRUCTOSAMINE: Fructosamine: 266 umol/L (ref 205–285)

## 2023-02-11 ENCOUNTER — Other Ambulatory Visit: Payer: Self-pay | Admitting: Nurse Practitioner

## 2023-02-11 DIAGNOSIS — G2581 Restless legs syndrome: Secondary | ICD-10-CM

## 2023-02-16 ENCOUNTER — Ambulatory Visit: Payer: Medicare Other | Admitting: Internal Medicine

## 2023-02-26 ENCOUNTER — Other Ambulatory Visit: Payer: Self-pay | Admitting: Internal Medicine

## 2023-02-26 DIAGNOSIS — K219 Gastro-esophageal reflux disease without esophagitis: Secondary | ICD-10-CM

## 2023-02-28 ENCOUNTER — Ambulatory Visit (INDEPENDENT_AMBULATORY_CARE_PROVIDER_SITE_OTHER)
Admission: RE | Admit: 2023-02-28 | Discharge: 2023-02-28 | Disposition: A | Discharge status: Home / Self Care | Payer: Medicare Other | Source: Ambulatory Visit | Attending: Surgery | Admitting: Surgery

## 2023-02-28 ENCOUNTER — Ambulatory Visit (HOSPITAL_COMMUNITY)
Admission: RE | Admit: 2023-02-28 | Discharge: 2023-02-28 | Disposition: A | Discharge status: Home / Self Care | Payer: Medicare Other | Source: Ambulatory Visit | Attending: Surgery | Admitting: Surgery

## 2023-02-28 ENCOUNTER — Encounter: Payer: Self-pay | Admitting: Surgery

## 2023-02-28 ENCOUNTER — Ambulatory Visit (INDEPENDENT_AMBULATORY_CARE_PROVIDER_SITE_OTHER): Payer: Medicare Other | Admitting: Surgery

## 2023-02-28 VITALS — BP 178/82 | HR 63 | Temp 98.0°F | Resp 20 | Ht 66.0 in | Wt 188.0 lb

## 2023-02-28 DIAGNOSIS — I739 Peripheral vascular disease, unspecified: Secondary | ICD-10-CM | POA: Diagnosis not present

## 2023-02-28 DIAGNOSIS — I6523 Occlusion and stenosis of bilateral carotid arteries: Secondary | ICD-10-CM | POA: Diagnosis not present

## 2023-02-28 DIAGNOSIS — I70243 Atherosclerosis of 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: Secondary | ICD-10-CM | POA: Diagnosis not present

## 2023-02-28 DIAGNOSIS — I70213 Atherosclerosis of native arteries of extremities with intermittent claudication, bilateral legs: Secondary | ICD-10-CM

## 2023-02-28 LAB — VAS US ABI WITH/WO TBI
Left ABI: 0.78
Right ABI: 0.48

## 2023-02-28 NOTE — H&P (View-Only) (Signed)
 [facility] [place]  Patient name: [name] MRN: [id number] DOB: 03/27/1946 Sex: female   REASON FOR VISIT:    Follow up  HISOTRY OF PRESENT ILLNESS:    [name] is a 77 y.o. female who I saw on 11/16/2021 for a leg wound.  She has a history of left superficial femoral artery stenting in 2012 and developed early in-stent stenosis which was treated with balloon angioplasty which ultimately failed.  Up to this point, she had been managing her claudication symptoms.  On 11/18/2021 she underwent left femoral to below-knee popliteal artery bypass graft with vein.  This was for a left ankle ulcer.  I was originally going to do an above-knee bypass, however the artery was heavily calcified at this level.  She developed distal anastomotic stenosis which required treatment on 03/09/2022.  3 months ago, she had an ultrasound that showed elevated velocities at her distal anastomosis.  She is back today for follow-up.  She states that she does has issues with walking.  Her daughter was recently in the hospital and she had to use a walker.  She will use a buggy at the grocery store.  She does not have any wounds.  The patient also has carotid stenosis which has been asymptomatic.  She is medically managed for hypertension.  She is a smoker.  She is taking a statin for hypercholesterolemia.   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,  "small"   Anxiety    Arthritis    Cataract    CKD (chronic kidney disease), stage IV (HCC)    followed by [facility]   Colon polyps    Diabetes mellitus 1998   Diverticulosis    Emphysema of lung (HCC)    "no one has evry told me that"   ESOPHAGEAL STRICTURE 08/27/2008   Family history of adverse reaction to anesthesia    daughter n/v   GERD (gastroesophageal reflux  disease)    Hemorrhoids    Hiatal hernia    Hyperlipidemia    Hypertension 2000   Migraines    Neuropathy    fingers and toe   Peripheral vascular disease (HCC)    Phlebitis    30 years ago  left leg   Stroke (HCC)    multiple mini strokes ( brain aneurysm ).  Right side weaker.     FAMILY HISTORY:   Family History  Problem Relation Age of Onset   CAD Mother 61       Died of MI   Hypertension Mother    Heart attack Mother    Heart disease Mother    CAD Father 61       Died of MI   Heart disease Father    Heart attack Father    CAD Brother 60       Two brothers died of MI   Heart attack Brother    Heart disease Brother        Amputation   Diabetes Sister    Hypertension Sister    Heart attack Brother    Heart disease Brother    Stroke Sister    Colon cancer Neg Hx    Stomach cancer Neg Hx    Esophageal cancer Neg Hx     SOCIAL HISTORY:   Social History   Tobacco Use   Smoking   status: Light Smoker    Packs/day: 0.40    Years: 40.00    Additional pack years: 0.00    Total pack years: 16.00    Types: Cigarettes    Passive exposure: Never   Smokeless tobacco: Never   Tobacco comments:    11/18/20 in process of quiting  Substance Use Topics   Alcohol use: No    Alcohol/week: 0.0 standard drinks of alcohol     ALLERGIES:   Allergies  Allergen Reactions   Ciprofloxacin Swelling   Ozempic (0.25 Or 0.5 Mg-Dose) [Semaglutide(0.25 Or 0.5mg-Dos)] Nausea And Vomiting   Plavix [Clopidogrel Bisulfate] Palpitations   Amoxicillin Itching and Swelling    FACE & EYES SWELL   Ace Inhibitors Cough    Sore throat   Atorvastatin     myalgias   Lyrica [Pregabalin] Itching and Nausea Only     gain weight   Penicillins Other (See Comments)    Pt unsure if there is an allergic reaction      CURRENT MEDICATIONS:   Current Outpatient Medications  Medication Sig Dispense Refill   acetaminophen (TYLENOL) 500 MG tablet Take 1,000 mg by mouth every 6 (six) hours  as needed for mild pain.     amLODipine (NORVASC) 10 MG tablet Take 1 tablet (10 mg total) by mouth daily. for blood pressure 90 tablet 3   aspirin 325 MG EC tablet Take 325 mg by mouth at bedtime.     Blood Glucose Monitoring Suppl (ONETOUCH VERIO FLEX SYSTEM) w/Device KIT USE AS ADVISED 1 kit 0   busPIRone (BUSPAR) 5 MG tablet TAKE 1 TABLET BY MOUTH THREE TIMES A DAY (Patient taking differently: Take 5 mg by mouth 3 (three) times daily as needed (anxiety).) 270 tablet 1   citalopram (CELEXA) 40 MG tablet TAKE 1 TABLET BY MOUTH EVERY DAY IN THE MORNING (Patient taking differently: Take 40 mg by mouth daily.) 90 tablet 2   dapagliflozin propanediol (FARXIGA) 10 MG TABS tablet Take 1 tablet (10 mg total) by mouth daily. 90 tablet 3   glucose blood test strip Use as instructed 3x a day - One Touch Verio 300 each 3   insulin aspart (NOVOLOG FLEXPEN) 100 UNIT/ML FlexPen Inject 10-14 Units into the skin 3 (three) times daily before meals. 45 mL 3   insulin degludec (TRESIBA FLEXTOUCH) 200 UNIT/ML FlexTouch Pen Inject 40 Units into the skin daily. 18 mL 3   meclizine (ANTIVERT) 25 MG tablet 1/2 tab up to 3 times daily for motion sickness/dizziness. (Patient taking differently: Take 12.5 mg by mouth 3 (three) times daily as needed for dizziness. 1/2 tab up to 3 times daily for motion sickness/dizziness.) 30 tablet 0   olmesartan (BENICAR) 40 MG tablet Take 1 tablet (40 mg total) by mouth daily. for blood pressure 90 tablet 3   Omega-3 Fatty Acids (FISH OIL) 1000 MG CAPS Take 1,000 mg by mouth every morning.     OneTouch Delica Lancets 33G MISC Use 3x a day 200 each 3   pantoprazole (PROTONIX) 40 MG tablet TAKE 1 TABLET BY MOUTH EVERY DAY 90 tablet 1   Polyethyl Glycol-Propyl Glycol (SYSTANE ULTRA OP) Place 1 drop into both eyes daily at 12 noon. Additional drop if needed for dry eyes     rOPINIRole (REQUIP) 0.25 MG tablet TAKE 1 TABLET BY MOUTH EVERYDAY AT BEDTIME 90 tablet 2   rosuvastatin (CRESTOR) 40 MG  tablet Take 1 tablet (40 mg total) by mouth daily. 90 tablet 1   No current   facility-administered medications for this visit.    REVIEW OF SYSTEMS:   [X] denotes positive finding, [ ] denotes negative finding Cardiac  Comments:  Chest pain or chest pressure:    Shortness of breath upon exertion:    Short of breath when lying flat:    Irregular heart rhythm:        Vascular    Pain in calf, thigh, or hip brought on by ambulation:    Pain in feet at night that wakes you up from your sleep:     Blood clot in your veins:    Leg swelling:         Pulmonary    Oxygen at home:    Productive cough:     Wheezing:         Neurologic    Sudden weakness in arms or legs:     Sudden numbness in arms or legs:     Sudden onset of difficulty speaking or slurred speech:    Temporary loss of vision in one eye:     Problems with dizziness:         Gastrointestinal    Blood in stool:     Vomited blood:         Genitourinary    Burning when urinating:     Blood in urine:        Psychiatric    Major depression:         Hematologic    Bleeding problems:    Problems with blood clotting too easily:        Skin    Rashes or ulcers:        Constitutional    Fever or chills:      PHYSICAL EXAM:   Vitals:   02/28/23 1407 02/28/23 1412  BP: (!) 182/78 (!) 178/82  Pulse: 63   Resp: 20   Temp: 98 F (36.7 C)   SpO2: 96%   Weight: 188 lb (85.3 kg)   Height: 5' 6" (1.676 m)     GENERAL: The patient is a well-nourished female, in no acute distress. The vital signs are documented above. CARDIAC: There is a regular rate and rhythm.  VASCULAR: Nonpalpable pedal pulse PULMONARY: Non-labored respirations MUSCULOSKELETAL: There are no major deformities or cyanosis. NEUROLOGIC: No focal weakness or paresthesias are detected. SKIN: There are no ulcers or rashes noted. PSYCHIATRIC: The patient has a normal affect.  STUDIES:   I have reviewed the following: Carotid: Right Carotid:  Velocities in the right ICA are consistent with a 40-59%                 stenosis.   Left Carotid: Velocities in the left ICA are consistent with a 1-39%  stenosis.               Hemodynamically significant plaque >50% visualized in the  CCA.   Vertebrals: Bilateral vertebral arteries demonstrate antegrade flow.  Subclavians: Right subclavian artery was stenotic. Normal flow  hemodynamics were               seen in the left subclavian artery.  +-------+-----------+-----------+------------+------------+  ABI/TBIToday's ABIToday's TBIPrevious ABIPrevious TBI  +-------+-----------+-----------+------------+------------+  Right 0.48       0.27       0.50        0.35          +-------+-----------+-----------+------------+------------+  Left  0.78       0.48       0.91          0.55          +-------+-----------+-----------+------------+------------+   Left: Patent bypass graft with disease velocities listed below:  Inflow velocities (50-74%) stenosis.  263 cm/s Proximal anastomosis (50-70%).  277 cm/s Distal graft (50-70%) stenosis.  251 cm/s Distal anastomosis (>70%) stenosis.  328 cm/s Outflow (30-49%) stenosis.    MEDICAL ISSUES:   PAD: The patient is status post femoral-popliteal bypass grafting.  She has already undergone angioplasty for bypass graft stenosis approximately 1 year ago.  We saw her 3 months ago and her velocities were increasing.  We elected to observe her for another 3 months.  She is back today for follow-up.  On ultrasound the velocities throughout the graft have increased.  I am very concerned about premature failure of her bypass graft.  I think she needs to undergo angiography for better evaluation.  This will need to be done with CO2 in conjunction with contrast.  I will likely end up treating her bypass graft stenosis with either a drug-coated balloon or possibly atherectomy.  I used a DCB a year ago with 1 year patency.  If it looks like she would  benefit from atherectomy I would do that to try something different otherwise a DCB would be used.  She wants to get this done as soon as possible.    Wells Avantae Bither, IV, MD, FACS [facility] [place] Tel [phone] Pager [phone]  

## 2023-02-28 NOTE — Progress Notes (Signed)
[facility]   Patient name: [name] MRN: [id number] DOB: 11-25-1945 Sex: female   REASON FOR VISIT:    Follow up  HISOTRY OF PRESENT ILLNESS:    [name] is a 77 y.o. female who I saw on 11/16/2021 for a leg wound.  She has a history of left superficial femoral artery stenting in 2012 and developed early in-stent stenosis which was treated with balloon angioplasty which ultimately failed.  Up to this point, she had been managing her claudication symptoms.  On 11/18/2021 she underwent left femoral to below-knee popliteal artery bypass graft with vein.  This was for a left ankle ulcer.  I was originally going to do an above-knee bypass, however the artery was heavily calcified at this level.  She developed distal anastomotic stenosis which required treatment on 03/09/2022.  3 months ago, she had an ultrasound that showed elevated velocities at her distal anastomosis.  She is back today for follow-up.  She states that she does has issues with walking.  Her daughter was recently in the hospital and she had to use a walker.  She will use a buggy at the grocery store.  She does not have any wounds.  The patient also has carotid stenosis which has been asymptomatic.  She is medically managed for hypertension.  She is a smoker.  She is taking a statin for hypercholesterolemia.   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,  "small"   Anxiety    Arthritis    Cataract    CKD (chronic kidney disease), stage IV (HCC)    followed by [facility]   Colon polyps    Diabetes mellitus 1998   Diverticulosis    Emphysema of lung (HCC)    "no one has evry told me that"   ESOPHAGEAL STRICTURE 08/27/2008   Family history of adverse reaction to anesthesia    daughter n/v   GERD (gastroesophageal reflux  disease)    Hemorrhoids    Hiatal hernia    Hyperlipidemia    Hypertension 2000   Migraines    Neuropathy    fingers and toe   Peripheral vascular disease (HCC)    Phlebitis    30 years ago  left leg   Stroke [facility])    multiple mini strokes ( brain aneurysm ).  Right side weaker.     FAMILY HISTORY:   Family History  Problem Relation Age of Onset   CAD Mother 42       Died of MI   Hypertension Mother    Heart attack Mother    Heart disease Mother    CAD Father 97       Died of MI   Heart disease Father    Heart attack Father    CAD Brother 86       Two brothers died of MI   Heart attack Brother    Heart disease Brother        Amputation   Diabetes Sister    Hypertension Sister    Heart attack Brother    Heart disease Brother    Stroke Sister    Colon cancer Neg Hx    Stomach cancer Neg Hx    Esophageal cancer Neg Hx     SOCIAL HISTORY:   Social History   Tobacco Use   Smoking  status: Light Smoker    Packs/day: 0.40    Years: 40.00    Additional pack years: 0.00    Total pack years: 16.00    Types: Cigarettes    Passive exposure: Never   Smokeless tobacco: Never   Tobacco comments:    11/18/20 in process of quiting  Substance Use Topics   Alcohol use: No    Alcohol/week: 0.0 standard drinks of alcohol     ALLERGIES:   Allergies  Allergen Reactions   Ciprofloxacin Swelling   Ozempic (0.25 Or 0.5 Mg-Dose) [Semaglutide(0.25 Or 0.5mg -Dos)] Nausea And Vomiting   Plavix [Clopidogrel Bisulfate] Palpitations   Amoxicillin Itching and Swelling    FACE & EYES SWELL   Ace Inhibitors Cough    Sore throat   Atorvastatin     myalgias   Lyrica [Pregabalin] Itching and Nausea Only     gain weight   Penicillins Other (See Comments)    Pt unsure if there is an allergic reaction      CURRENT MEDICATIONS:   Current Outpatient Medications  Medication Sig Dispense Refill   acetaminophen (TYLENOL) 500 MG tablet Take 1,000 mg by mouth every 6 (six) hours  as needed for mild pain.     amLODipine (NORVASC) 10 MG tablet Take 1 tablet (10 mg total) by mouth daily. for blood pressure 90 tablet 3   aspirin 325 MG EC tablet Take 325 mg by mouth at bedtime.     Blood Glucose Monitoring Suppl (ONETOUCH VERIO FLEX SYSTEM) w/Device KIT USE AS ADVISED 1 kit 0   busPIRone (BUSPAR) 5 MG tablet TAKE 1 TABLET BY MOUTH THREE TIMES A DAY (Patient taking differently: Take 5 mg by mouth 3 (three) times daily as needed (anxiety).) 270 tablet 1   citalopram (CELEXA) 40 MG tablet TAKE 1 TABLET BY MOUTH EVERY DAY IN THE MORNING (Patient taking differently: Take 40 mg by mouth daily.) 90 tablet 2   dapagliflozin propanediol (FARXIGA) 10 MG TABS tablet Take 1 tablet (10 mg total) by mouth daily. 90 tablet 3   glucose blood test strip Use as instructed 3x a day - One Touch Verio 300 each 3   insulin aspart (NOVOLOG FLEXPEN) 100 UNIT/ML FlexPen Inject 10-14 Units into the skin 3 (three) times daily before meals. 45 mL 3   insulin degludec (TRESIBA FLEXTOUCH) 200 UNIT/ML FlexTouch Pen Inject 40 Units into the skin daily. 18 mL 3   meclizine (ANTIVERT) 25 MG tablet 1/2 tab up to 3 times daily for motion sickness/dizziness. (Patient taking differently: Take 12.5 mg by mouth 3 (three) times daily as needed for dizziness. 1/2 tab up to 3 times daily for motion sickness/dizziness.) 30 tablet 0   olmesartan (BENICAR) 40 MG tablet Take 1 tablet (40 mg total) by mouth daily. for blood pressure 90 tablet 3   Omega-3 Fatty Acids (FISH OIL) 1000 MG CAPS Take 1,000 mg by mouth every morning.     OneTouch Delica Lancets 33G MISC Use 3x a day 200 each 3   pantoprazole (PROTONIX) 40 MG tablet TAKE 1 TABLET BY MOUTH EVERY DAY 90 tablet 1   Polyethyl Glycol-Propyl Glycol (SYSTANE ULTRA OP) Place 1 drop into both eyes daily at 12 noon. Additional drop if needed for dry eyes     rOPINIRole (REQUIP) 0.25 MG tablet TAKE 1 TABLET BY MOUTH EVERYDAY AT BEDTIME 90 tablet 2   rosuvastatin (CRESTOR) 40 MG  tablet Take 1 tablet (40 mg total) by mouth daily. 90 tablet 1   No current  facility-administered medications for this visit.    REVIEW OF SYSTEMS:   [X]  denotes positive finding, [ ]  denotes negative finding Cardiac  Comments:  Chest pain or chest pressure:    Shortness of breath upon exertion:    Short of breath when lying flat:    Irregular heart rhythm:        Vascular    Pain in calf, thigh, or hip brought on by ambulation:    Pain in feet at night that wakes you up from your sleep:     Blood clot in your veins:    Leg swelling:         Pulmonary    Oxygen at home:    Productive cough:     Wheezing:         Neurologic    Sudden weakness in arms or legs:     Sudden numbness in arms or legs:     Sudden onset of difficulty speaking or slurred speech:    Temporary loss of vision in one eye:     Problems with dizziness:         Gastrointestinal    Blood in stool:     Vomited blood:         Genitourinary    Burning when urinating:     Blood in urine:        Psychiatric    Major depression:         Hematologic    Bleeding problems:    Problems with blood clotting too easily:        Skin    Rashes or ulcers:        Constitutional    Fever or chills:      PHYSICAL EXAM:   Vitals:   02/28/23 1407 02/28/23 1412  BP: (!) 182/78 (!) 178/82  Pulse: 63   Resp: 20   Temp: 98 F (36.7 C)   SpO2: 96%   Weight: 188 lb (85.3 kg)   Height: 5\' 6"  (1.676 m)     GENERAL: The patient is a well-nourished female, in no acute distress. The vital signs are documented above. CARDIAC: There is a regular rate and rhythm.  VASCULAR: Nonpalpable pedal pulse PULMONARY: Non-labored respirations MUSCULOSKELETAL: There are no major deformities or cyanosis. NEUROLOGIC: No focal weakness or paresthesias are detected. SKIN: There are no ulcers or rashes noted. PSYCHIATRIC: The patient has a normal affect.  STUDIES:   I have reviewed the following: Carotid: Right Carotid:  Velocities in the right ICA are consistent with a 40-59%                 stenosis.   Left Carotid: Velocities in the left ICA are consistent with a 1-39%  stenosis.               Hemodynamically significant plaque >50% visualized in the  CCA.   Vertebrals: Bilateral vertebral arteries demonstrate antegrade flow.  Subclavians: Right subclavian artery was stenotic. Normal flow  hemodynamics were               seen in the left subclavian artery.  +-------+-----------+-----------+------------+------------+  ABI/TBIToday's ABIToday's TBIPrevious ABIPrevious TBI  +-------+-----------+-----------+------------+------------+  Right 0.48       0.27       0.50        0.35          +-------+-----------+-----------+------------+------------+  Left  0.78       0.48       0.91  0.55          +-------+-----------+-----------+------------+------------+   Left: Patent bypass graft with disease velocities listed below:  Inflow velocities (50-74%) stenosis.  263 cm/s Proximal anastomosis (50-70%).  277 cm/s Distal graft (50-70%) stenosis.  251 cm/s Distal anastomosis (>70%) stenosis.  328 cm/s Outflow (30-49%) stenosis.    MEDICAL ISSUES:   PAD: The patient is status post femoral-popliteal bypass grafting.  She has already undergone angioplasty for bypass graft stenosis approximately 1 year ago.  We saw her 3 months ago and her velocities were increasing.  We elected to observe her for another 3 months.  She is back today for follow-up.  On ultrasound the velocities throughout the graft have increased.  I am very concerned about premature failure of her bypass graft.  I think she needs to undergo angiography for better evaluation.  This will need to be done with CO2 in conjunction with contrast.  I will likely end up treating her bypass graft stenosis with either a drug-coated balloon or possibly atherectomy.  I used a DCB a year ago with 1 year patency.  If it looks like she would  benefit from atherectomy I would do that to try something different otherwise a DCB would be used.  She wants to get this done as soon as possible.    Charlena Cross, MD, FACS [facility] [phone] Pager [phone]

## 2023-03-02 ENCOUNTER — Other Ambulatory Visit: Payer: Self-pay

## 2023-03-02 DIAGNOSIS — T82858A Stenosis of vascular prosthetic devices, implants and grafts, initial encounter: Secondary | ICD-10-CM

## 2023-03-14 ENCOUNTER — Telehealth: Payer: Self-pay | Admitting: Nurse Practitioner

## 2023-03-14 DIAGNOSIS — K219 Gastro-esophageal reflux disease without esophagitis: Secondary | ICD-10-CM

## 2023-03-14 MED ORDER — PANTOPRAZOLE SODIUM 40 MG PO TBEC
40.0000 mg | DELAYED_RELEASE_TABLET | Freq: Every day | ORAL | 1 refills | Status: DC
Start: 2023-03-14 — End: 2023-08-08

## 2023-03-14 NOTE — Telephone Encounter (Signed)
Requesting refill on pantoprazole. Pls send to [facility] on [address].

## 2023-03-14 NOTE — Addendum Note (Signed)
Addended by: Dionicio Stall on: 03/14/2023 10:25 AM   Modules accepted: Orders

## 2023-03-15 ENCOUNTER — Encounter (HOSPITAL_COMMUNITY)
Admission: RE | Disposition: A | Discharge status: Home / Self Care | Payer: Self-pay | Source: Home / Self Care | Attending: Surgery

## 2023-03-15 ENCOUNTER — Ambulatory Visit (HOSPITAL_COMMUNITY)
Admission: RE | Admit: 2023-03-15 | Discharge: 2023-03-15 | Disposition: A | Discharge status: Home / Self Care | Payer: Medicare Other | Attending: Surgery | Admitting: Surgery

## 2023-03-15 ENCOUNTER — Other Ambulatory Visit: Payer: Self-pay

## 2023-03-15 DIAGNOSIS — F17210 Nicotine dependence, cigarettes, uncomplicated: Secondary | ICD-10-CM | POA: Diagnosis not present

## 2023-03-15 DIAGNOSIS — E1151 Type 2 diabetes mellitus with diabetic peripheral angiopathy without gangrene: Secondary | ICD-10-CM | POA: Diagnosis not present

## 2023-03-15 DIAGNOSIS — E7800 Pure hypercholesterolemia, unspecified: Secondary | ICD-10-CM | POA: Insufficient documentation

## 2023-03-15 DIAGNOSIS — I129 Hypertensive chronic kidney disease with stage 1 through stage 4 chronic kidney disease, or unspecified chronic kidney disease: Secondary | ICD-10-CM | POA: Diagnosis not present

## 2023-03-15 DIAGNOSIS — T82858A Stenosis of vascular prosthetic devices, implants and grafts, initial encounter: Secondary | ICD-10-CM

## 2023-03-15 DIAGNOSIS — Z794 Long term (current) use of insulin: Secondary | ICD-10-CM | POA: Insufficient documentation

## 2023-03-15 DIAGNOSIS — E1122 Type 2 diabetes mellitus with diabetic chronic kidney disease: Secondary | ICD-10-CM | POA: Diagnosis not present

## 2023-03-15 DIAGNOSIS — Y832 Surgical operation with anastomosis, bypass or graft as the cause of abnormal reaction of the patient, or of later complication, without mention of misadventure at the time of the procedure: Secondary | ICD-10-CM | POA: Diagnosis not present

## 2023-03-15 DIAGNOSIS — N184 Chronic kidney disease, stage 4 (severe): Secondary | ICD-10-CM | POA: Diagnosis not present

## 2023-03-15 DIAGNOSIS — I6529 Occlusion and stenosis of unspecified carotid artery: Secondary | ICD-10-CM | POA: Diagnosis not present

## 2023-03-15 HISTORY — PX: PERIPHERAL VASCULAR BALLOON ANGIOPLASTY: CATH118281

## 2023-03-15 HISTORY — PX: ABDOMINAL AORTOGRAM W/LOWER EXTREMITY: CATH118223

## 2023-03-15 LAB — POCT I-STAT, CHEM 8
BUN: 52 mg/dL — ABNORMAL HIGH (ref 8–23)
Calcium, Ion: 1.05 mmol/L — ABNORMAL LOW (ref 1.15–1.40)
Chloride: 108 mmol/L (ref 98–111)
Creatinine, Ser: 2.2 mg/dL — ABNORMAL HIGH (ref 0.44–1.00)
Glucose, Bld: 178 mg/dL — ABNORMAL HIGH (ref 70–99)
HCT: 44 % (ref 36.0–46.0)
Hemoglobin: 15 g/dL (ref 12.0–15.0)
Potassium: 5.1 mmol/L (ref 3.5–5.1)
Sodium: 138 mmol/L (ref 135–145)
TCO2: 25 mmol/L (ref 22–32)

## 2023-03-15 LAB — GLUCOSE, CAPILLARY: Glucose-Capillary: 146 mg/dL — ABNORMAL HIGH (ref 70–99)

## 2023-03-15 SURGERY — ABDOMINAL AORTOGRAM W/LOWER EXTREMITY
Anesthesia: LOCAL

## 2023-03-15 MED ORDER — SODIUM CHLORIDE 0.9 % WEIGHT BASED INFUSION: 1.0000 mL/kg/h | INTRAVENOUS | Status: DC

## 2023-03-15 MED ORDER — IODIXANOL 320 MG/ML IV SOLN
INTRAVENOUS | Status: DC | PRN
  Administered 2023-03-15: 25 mL

## 2023-03-15 MED ORDER — SODIUM CHLORIDE 0.9 % IV SOLN: 250.0000 mL | INTRAVENOUS | Status: DC | PRN

## 2023-03-15 MED ORDER — LIDOCAINE HCL (PF) 1 % IJ SOLN
INTRAMUSCULAR | Status: DC | PRN
  Administered 2023-03-15: 15 mL

## 2023-03-15 MED ORDER — HEPARIN SODIUM (PORCINE) 1000 UNIT/ML IJ SOLN
INTRAMUSCULAR | Status: AC
  Filled 2023-03-15: qty 10

## 2023-03-15 MED ORDER — HEPARIN SODIUM (PORCINE) 1000 UNIT/ML IJ SOLN
INTRAMUSCULAR | Status: DC | PRN
  Administered 2023-03-15: 9000 [IU] via INTRAVENOUS

## 2023-03-15 MED ORDER — HYDRALAZINE HCL 20 MG/ML IJ SOLN: 5.0000 mg | INTRAMUSCULAR | Status: DC | PRN

## 2023-03-15 MED ORDER — SODIUM CHLORIDE 0.9 % IV SOLN: INTRAVENOUS | Status: DC

## 2023-03-15 MED ORDER — ONDANSETRON HCL 4 MG/2ML IJ SOLN: 4.0000 mg | Freq: Four times a day (QID) | INTRAMUSCULAR | Status: DC | PRN

## 2023-03-15 MED ORDER — MIDAZOLAM HCL 2 MG/2ML IJ SOLN
INTRAMUSCULAR | Status: AC
  Filled 2023-03-15: qty 2

## 2023-03-15 MED ORDER — FENTANYL CITRATE (PF) 100 MCG/2ML IJ SOLN
INTRAMUSCULAR | Status: DC | PRN
  Administered 2023-03-15: 50 ug via INTRAVENOUS

## 2023-03-15 MED ORDER — FENTANYL CITRATE (PF) 100 MCG/2ML IJ SOLN
INTRAMUSCULAR | Status: AC
  Filled 2023-03-15: qty 2

## 2023-03-15 MED ORDER — MORPHINE SULFATE (PF) 2 MG/ML IV SOLN: 2.0000 mg | INTRAVENOUS | Status: DC | PRN

## 2023-03-15 MED ORDER — SODIUM CHLORIDE 0.9% FLUSH: 3.0000 mL | INTRAVENOUS | Status: DC | PRN

## 2023-03-15 MED ORDER — LIDOCAINE HCL (PF) 1 % IJ SOLN
INTRAMUSCULAR | Status: AC
  Filled 2023-03-15: qty 30

## 2023-03-15 MED ORDER — HEPARIN (PORCINE) IN NACL 1000-0.9 UT/500ML-% IV SOLN
INTRAVENOUS | Status: DC | PRN
  Administered 2023-03-15 (×2): 500 mL

## 2023-03-15 MED ORDER — OXYCODONE HCL 5 MG PO TABS: 5.0000 mg | ORAL_TABLET | ORAL | Status: DC | PRN

## 2023-03-15 MED ORDER — ACETAMINOPHEN 325 MG PO TABS: 650.0000 mg | ORAL_TABLET | ORAL | Status: DC | PRN

## 2023-03-15 MED ORDER — LABETALOL HCL 5 MG/ML IV SOLN: 10.0000 mg | INTRAVENOUS | Status: DC | PRN

## 2023-03-15 MED ORDER — SODIUM CHLORIDE 0.9% FLUSH: 3.0000 mL | Freq: Two times a day (BID) | INTRAVENOUS | Status: DC

## 2023-03-15 MED ORDER — MIDAZOLAM HCL 2 MG/2ML IJ SOLN
INTRAMUSCULAR | Status: DC | PRN
  Administered 2023-03-15 (×2): 1 mg via INTRAVENOUS

## 2023-03-15 SURGICAL SUPPLY — 18 items
CATH ANGIO 5F BER2 65CM (CATHETERS) IMPLANT
CATH OMNI FLUSH 5F 65CM (CATHETERS) IMPLANT
CLOSURE MYNX CONTROL 6F/7F (Vascular Products) IMPLANT
DCB RANGER 5.0X100 135 (BALLOONS) IMPLANT
KIT ANGIASSIST CO2 SYSTEM (KITS) IMPLANT
KIT ENCORE 26 ADVANTAGE (KITS) IMPLANT
KIT MICROPUNCTURE NIT STIFF (SHEATH) IMPLANT
KIT PV (KITS) ×3 IMPLANT
RANGER DCB 5.0X100 135 (BALLOONS) ×2
SHEATH CATAPULT 6FR 60 (SHEATH) IMPLANT
SHEATH PINNACLE 5F 10CM (SHEATH) IMPLANT
SHEATH PINNACLE 6F 10CM (SHEATH) IMPLANT
SHEATH PROBE COVER 6X72 (BAG) IMPLANT
STOPCOCK MORSE 400PSI 3WAY (MISCELLANEOUS) IMPLANT
TRANSDUCER W/STOPCOCK (MISCELLANEOUS) ×3 IMPLANT
TRAY PV CATH (CUSTOM PROCEDURE TRAY) ×3 IMPLANT
WIRE BENTSON .035X145CM (WIRE) IMPLANT
WIRE SPARTACORE .014X300CM (WIRE) IMPLANT

## 2023-03-15 NOTE — Interval H&P Note (Signed)
History and Physical Interval Note:  03/15/2023 7:38 AM  [name]  has presented today for surgery, with the diagnosis of bypass graft stenosis.  The various methods of treatment have been discussed with the patient and family. After consideration of risks, benefits and other options for treatment, the patient has consented to  Procedure(s): ABDOMINAL AORTOGRAM W/LOWER EXTREMITY (N/A) as a surgical intervention.  The patient's history has been reviewed, patient examined, no change in status, stable for surgery.  I have reviewed the patient's chart and labs.  Questions were answered to the patient's satisfaction.     Durene Cal

## 2023-03-15 NOTE — Interval H&P Note (Signed)
History and Physical Interval Note:  03/15/2023 8:09 AM  [name]  has presented today for surgery, with the diagnosis of bypass graft stenosis.  The various methods of treatment have been discussed with the patient and family. After consideration of risks, benefits and other options for treatment, the patient has consented to  Procedure(s): ABDOMINAL AORTOGRAM W/LOWER EXTREMITY (N/A) as a surgical intervention.  The patient's history has been reviewed, patient examined, no change in status, stable for surgery.  I have reviewed the patient's chart and labs.  Questions were answered to the patient's satisfaction.     Durene Cal

## 2023-03-15 NOTE — Discharge Instructions (Signed)
Femoral Site Care This sheet gives you information about how to care for yourself after your procedure. Your health care provider may also give you more specific instructions. If you have problems or questions, contact your health care provider. What can I expect after the procedure?  After the procedure, it is common to have: Bruising that usually fades within 1-2 weeks. Tenderness at the site. Follow these instructions at home: Wound care Follow instructions from your health care provider about how to take care of your insertion site. Make sure you: Wash your hands with soap and water before you change your bandage (dressing). If soap and water are not available, use hand sanitizer. Remove your dressing as told by your health care provider. In 24 hours Do not take baths, swim, or use a hot tub until your health care provider approves. You may shower 24-48 hours after the procedure or as told by your health care provider. Gently wash the site with plain soap and water. Pat the area dry with a clean towel. Do not rub the site. This may cause bleeding. Do not apply powder or lotion to the site. Keep the site clean and dry. Check your femoral site every day for signs of infection. Check for: Redness, swelling, or pain. Fluid or blood. Warmth. Pus or a bad smell. Activity For the first 2-3 days after your procedure, or as long as directed: Avoid climbing stairs as much as possible. Do not squat. Do not lift anything that is heavier than 10 lb (4.5 kg), or the limit that you are told, until your health care provider says that it is safe. For 5 days Rest as directed. Avoid sitting for a long time without moving. Get up to take short walks every 1-2 hours. Do not drive for 24 hours if you were given a medicine to help you relax (sedative). General instructions Take over-the-counter and prescription medicines only as told by your health care provider. Keep all follow-up visits as told by  your health care provider. This is important. Contact a health care provider if you have: A fever or chills. You have redness, swelling, or pain around your insertion site. Get help right away if: The catheter insertion area swells very fast. You pass out. You suddenly start to sweat or your skin gets clammy. The catheter insertion area is bleeding, and the bleeding does not stop when you hold steady pressure on the area. The area near or just beyond the catheter insertion site becomes pale, cool, tingly, or numb. These symptoms may represent a serious problem that is an emergency. Do not wait to see if the symptoms will go away. Get medical help right away. Call your local emergency services (911 in the [place] Do not drive yourself to the hospital. Summary After the procedure, it is common to have bruising that usually fades within 1-2 weeks. Check your femoral site every day for signs of infection. Do not lift anything that is heavier than 10 lb (4.5 kg), or the limit that you are told, until your health care provider says that it is safe. This information is not intended to replace advice given to you by your health care provider. Make sure you discuss any questions you have with your health care provider. Document Revised: 09/19/2017 Document Reviewed: 09/19/2017 Elsevier Patient Education  2020 [employer]Femoral Site Care This sheet gives you information about how to care for yourself after your procedure. Your health care provider may also give you more specific instructions. If   you have problems or questions, contact your health care provider. What can I expect after the procedure?  After the procedure, it is common to have: Bruising that usually fades within 1-2 weeks. Tenderness at the site. Follow these instructions at home: Wound care Follow instructions from your health care provider about how to take care of your insertion site. Make sure you: Wash your hands with soap and  water before you change your bandage (dressing). If soap and water are not available, use hand sanitizer. Remove your dressing as told by your health care provider. In 24 hours Do not take baths, swim, or use a hot tub until your health care provider approves. You may shower 24-48 hours after the procedure or as told by your health care provider. Gently wash the site with plain soap and water. Pat the area dry with a clean towel. Do not rub the site. This may cause bleeding. Do not apply powder or lotion to the site. Keep the site clean and dry. Check your femoral site every day for signs of infection. Check for: Redness, swelling, or pain. Fluid or blood. Warmth. Pus or a bad smell. Activity For the first 2-3 days after your procedure, or as long as directed: Avoid climbing stairs as much as possible. Do not squat. Do not lift anything that is heavier than 10 lb (4.5 kg), or the limit that you are told, until your health care provider says that it is safe. For 5 days Rest as directed. Avoid sitting for a long time without moving. Get up to take short walks every 1-2 hours. Do not drive for 24 hours if you were given a medicine to help you relax (sedative). General instructions Take over-the-counter and prescription medicines only as told by your health care provider. Keep all follow-up visits as told by your health care provider. This is important. Contact a health care provider if you have: A fever or chills. You have redness, swelling, or pain around your insertion site. Get help right away if: The catheter insertion area swells very fast. You pass out. You suddenly start to sweat or your skin gets clammy. The catheter insertion area is bleeding, and the bleeding does not stop when you hold steady pressure on the area. The area near or just beyond the catheter insertion site becomes pale, cool, tingly, or numb. These symptoms may represent a serious problem that is an emergency.  Do not wait to see if the symptoms will go away. Get medical help right away. Call your local emergency services (911 in the [place] Do not drive yourself to the hospital. Summary After the procedure, it is common to have bruising that usually fades within 1-2 weeks. Check your femoral site every day for signs of infection. Do not lift anything that is heavier than 10 lb (4.5 kg), or the limit that you are told, until your health care provider says that it is safe. This information is not intended to replace advice given to you by your health care provider. Make sure you discuss any questions you have with your health care provider. Document Revised: 09/19/2017 Document Reviewed: 09/19/2017 Elsevier Patient Education  2020 [employer] 

## 2023-03-15 NOTE — Op Note (Signed)
Patient name: [name] MRN: [id number] DOB: 07/22/1946 Sex: female  03/15/2023 Pre-operative Diagnosis: Left leg bypass graft stenosis Post-operative diagnosis:  Same Surgeon:  Durene Cal Procedure Performed:  1.  Ultrasound-guided access, right femoral artery  2.  Aortobifemoral angiogram with CO2  3.  Left leg angiogram  4.  Selective injections with catheter and left femoral-popliteal bypass graft  5.  Drug-coated balloon angioplasty, left femoral-popliteal bypass graft  6.  Conscious sedation, 65 minutes  7.  Closure device, Mynx   Indications: This is a 76 year old female with history of left femoral-popliteal bypass graft.  She has had stenosis recurred and the distal portion of the bypass graft.  She comes in today for angiogram and intervention for Ongoing patency of her bypass  Procedure:  The patient was identified in the holding area and taken to room 8.  The patient was then placed supine on the table and prepped and draped in the usual sterile fashion.  A time out was called.  Conscious sedation was administered with the use of IV fentanyl and Versed under continuous physician and nurse monitoring.  Heart rate, blood pressure, and oxygen saturation were continuously monitored.  Total sedation time was 65 minutes.  Ultrasound was used to evaluate the right common femoral artery.  It was patent .  A digital ultrasound image was acquired.  A micropuncture needle was used to access the right common femoral artery under ultrasound guidance.  An 018 wire was advanced without resistance and a micropuncture sheath was placed.  The 018 wire was removed and a benson wire was placed.  The micropuncture sheath was exchanged for a 5 french sheath.  An omniflush catheter was advanced over the wire to the level of L-1.  An abdominal angiogram with CO2 was obtained.  Next, using the omniflush catheter and a benson wire, the aortic bifurcation was crossed and the catheter was placed into  theleft external iliac artery and left runoff was obtained.    Findings:   Aortogram: No visualized renal artery stenosis.  The infrarenal abdominal aorta is widely patent as are bilateral common and external iliac arteries.  Bilateral common femoral arteries are monophasic.  Right Lower Extremity: Not evaluated due to renal insufficiency  Left Lower Extremity: Left common femoral and profundofemoral artery are widely patent.  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  There is a bypass graft originating in the common femoral artery through the proximal anastomosis is widely patent.  The bypass is patent throughout its course with the anastomosis to the below-knee popliteal artery.  In the distal 10 cm of the bypass graft, there is recurrent stenosis greater than 50%.  There is three-vessel runoff  Intervention: After the above images were acquired the decision made to proceed with intervention.  A 6 French sheath was advanced into the bypass graft.  The patient was fully heparinized.  Additional imaging was performed with the sheath and the bypass graft for better visualization of the stenosis.  A 014 wire was advanced across the distal anastomosis.  A 6 x 5 x 100 Ranger balloon and performed drug-coated balloon angioplasty for 3 minutes.  Completion imaging showed slight extravasation of contrast.  I reinflated the balloon for 4-1/2 minutes at nominal pressure.  Additional imaging was then performed in multiple views that showed no extravasation.  I contemplated stenting this, however with the patient's Plavix allergy and history of not doing well with stents and also because this would need to be a covered  stent, I elected to leave this alone.  The stenosis was significantly improved, less than 15%.  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  This caused slight perforation of the renal vein that was resolved with prolonged close ablation.     Juleen China, M.D., [facility] [place] Office: [phone] Pager:  [phone]

## 2023-03-16 ENCOUNTER — Encounter (HOSPITAL_COMMUNITY): Payer: Self-pay | Admitting: Surgery

## 2023-03-28 ENCOUNTER — Encounter: Payer: Self-pay | Admitting: Cardiology

## 2023-03-29 ENCOUNTER — Other Ambulatory Visit: Payer: Self-pay | Admitting: *Deleted

## 2023-03-29 DIAGNOSIS — I70213 Atherosclerosis of native arteries of extremities with intermittent claudication, bilateral legs: Secondary | ICD-10-CM

## 2023-03-31 ENCOUNTER — Ambulatory Visit (INDEPENDENT_AMBULATORY_CARE_PROVIDER_SITE_OTHER): Payer: Medicare Other

## 2023-03-31 ENCOUNTER — Encounter: Payer: Self-pay | Admitting: Nurse Practitioner

## 2023-03-31 ENCOUNTER — Ambulatory Visit: Payer: Medicare Other | Attending: Nurse Practitioner | Admitting: Nurse Practitioner

## 2023-03-31 VITALS — BP 184/70 | HR 64 | Ht 66.0 in | Wt 190.6 lb

## 2023-03-31 DIAGNOSIS — N184 Chronic kidney disease, stage 4 (severe): Secondary | ICD-10-CM | POA: Diagnosis not present

## 2023-03-31 DIAGNOSIS — I2584 Coronary atherosclerosis due to calcified coronary lesion: Secondary | ICD-10-CM

## 2023-03-31 DIAGNOSIS — I739 Peripheral vascular disease, unspecified: Secondary | ICD-10-CM

## 2023-03-31 DIAGNOSIS — Z720 Tobacco use: Secondary | ICD-10-CM

## 2023-03-31 DIAGNOSIS — I2510 Atherosclerotic heart disease of native coronary artery without angina pectoris: Secondary | ICD-10-CM

## 2023-03-31 DIAGNOSIS — E785 Hyperlipidemia, unspecified: Secondary | ICD-10-CM | POA: Diagnosis not present

## 2023-03-31 DIAGNOSIS — E118 Type 2 diabetes mellitus with unspecified complications: Secondary | ICD-10-CM | POA: Diagnosis not present

## 2023-03-31 DIAGNOSIS — Z794 Long term (current) use of insulin: Secondary | ICD-10-CM | POA: Insufficient documentation

## 2023-03-31 DIAGNOSIS — R002 Palpitations: Secondary | ICD-10-CM | POA: Diagnosis not present

## 2023-03-31 DIAGNOSIS — R0602 Shortness of breath: Secondary | ICD-10-CM | POA: Insufficient documentation

## 2023-03-31 DIAGNOSIS — I10 Essential (primary) hypertension: Secondary | ICD-10-CM | POA: Insufficient documentation

## 2023-03-31 DIAGNOSIS — I700 Atherosclerosis of aorta: Secondary | ICD-10-CM | POA: Insufficient documentation

## 2023-03-31 MED ORDER — HYDRALAZINE HCL 25 MG PO TABS: 25.0000 mg | ORAL_TABLET | Freq: Three times a day (TID) | ORAL | 3 refills | Status: DC

## 2023-03-31 MED ORDER — CARVEDILOL 3.125 MG PO TABS: 3.1250 mg | ORAL_TABLET | Freq: Two times a day (BID) | ORAL | 3 refills | Status: DC

## 2023-03-31 MED ORDER — OLMESARTAN MEDOXOMIL 40 MG PO TABS
40.0000 mg | ORAL_TABLET | Freq: Every day | ORAL | 3 refills | Status: DC
Start: 2023-03-31 — End: 2023-10-31

## 2023-03-31 NOTE — Progress Notes (Signed)
Office Visit    Patient Name: [name] Date of Encounter: 03/31/2023  Primary Care Provider:  Lucky Cowboy, MD Primary Cardiologist:  Karen Rotunda, MD  Chief Complaint    77 year old female with a history of coronary artery calcification noted on CT, aortic atherosclerosis, PSVT, PVD, carotid artery stenosis, hypertension, hyperlipidemia, brain aneurysm, CVA, type 2 diabetes, CKD stage IV, COPD, pulmonary nodules, tobacco use, and anxiety who presents for follow-up related to hypertension.  Past Medical History    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,  "small"   Anxiety    Arthritis    Cataract    CKD (chronic kidney disease), stage IV (HCC)    followed by [facility]   Colon polyps    Diabetes mellitus 1998   Diverticulosis    Emphysema of lung (HCC)    "no one has evry told me that"   ESOPHAGEAL STRICTURE 08/27/2008   Family history of adverse reaction to anesthesia    daughter n/v   GERD (gastroesophageal reflux disease)    Hemorrhoids    Hiatal hernia    Hyperlipidemia    Hypertension 2000   Migraines    Neuropathy    fingers and toe   Peripheral vascular disease (HCC)    Phlebitis    30 years ago  left leg   Stroke [facility])    multiple mini strokes ( brain aneurysm ).  Right side weaker.   Past Surgical History:  Procedure Laterality Date   ABDOMINAL AORTAGRAM N/A 01/04/2012   Procedure: ABDOMINAL AORTAGRAM;  Surgeon: Karen Libman, MD;  Location: [name] CATH LAB;  Service: Cardiovascular;  Laterality: N/A;   ABDOMINAL AORTAGRAM N/A 08/15/2012   Procedure: ABDOMINAL Ronny Flurry;  Surgeon: Karen Libman, MD;  Location: [facility] CATH LAB;  Service: Cardiovascular;  Laterality: N/A;   ABDOMINAL AORTOGRAM W/LOWER EXTREMITY Left 11/17/2021   Procedure: ABDOMINAL AORTOGRAM W/LOWER EXTREMITY;  Surgeon: Karen Libman, MD;  Location: [facility] INVASIVE CV LAB;   Service: Cardiovascular;  Laterality: Left;   ABDOMINAL AORTOGRAM W/LOWER EXTREMITY N/A 03/09/2022   Procedure: ABDOMINAL AORTOGRAM W/LOWER EXTREMITY;  Surgeon: Karen Libman, MD;  Location: [facility] INVASIVE CV LAB;  Service: Cardiovascular;  Laterality: N/A;   ABDOMINAL AORTOGRAM W/LOWER EXTREMITY N/A 03/15/2023   Procedure: ABDOMINAL AORTOGRAM W/LOWER EXTREMITY;  Surgeon: Karen Libman, MD;  Location: [facility] INVASIVE CV LAB;  Service: Cardiovascular;  Laterality: N/A;   ABDOMINAL HYSTERECTOMY  1984   cataract surgery Right 10-22-2015   cataract surgery Left 11-05-2015   CESAREAN SECTION     CHOLECYSTECTOMY     COLONOSCOPY  07/2010   DENTAL SURGERY  Aug. 16, 2013   left lower    ESOPHAGOGASTRODUODENOSCOPY  07/2010   EYE SURGERY  Nov. 2014   Laser-Glaucoma   FEMORAL ARTERY STENT  05/11/11   Left superficial femoral and popliteal artery   FEMORAL-POPLITEAL BYPASS GRAFT Left 11/18/2021   Procedure: LEFT FEMORAL-POPLITEAL ARTERY BYPASS GRAFT;  Surgeon: Karen Libman, MD;  Location: [facility] OR;  Service: Vascular;  Laterality: Left;   HERNIA REPAIR     times two   KNEE SURGERY     LOWER EXTREMITY ANGIOGRAM Left 08/15/2012   Procedure: LOWER EXTREMITY ANGIOGRAM;  Surgeon: Karen Libman, MD;  Location: [facility] CATH LAB;  Service: Cardiovascular;  Laterality: Left;  lt leg angio   PERIPHERAL VASCULAR BALLOON ANGIOPLASTY Left 03/09/2022   Procedure: PERIPHERAL VASCULAR  BALLOON ANGIOPLASTY;  Surgeon: Karen Libman, MD;  Location: [facility] INVASIVE CV LAB;  Service: Cardiovascular;  Laterality: Left;   PERIPHERAL VASCULAR BALLOON ANGIOPLASTY Left 03/15/2023   Procedure: PERIPHERAL VASCULAR BALLOON ANGIOPLASTY;  Surgeon: Karen Libman, MD;  Location: [facility] INVASIVE CV LAB;  Service: Cardiovascular;  Laterality: Left;   rotator cuff surgery     THYROID SURGERY      Allergies  Allergies  Allergen Reactions   Ciprofloxacin Swelling   Ozempic (0.25 Or 0.5 Mg-Dose) [Semaglutide(0.25 Or 0.5mg -Dos)] Nausea And  Vomiting   Plavix [Clopidogrel Bisulfate] Palpitations   Amoxicillin Itching and Swelling    FACE & EYES SWELL   Ace Inhibitors Cough    Sore throat   Atorvastatin     myalgias   Lyrica [Pregabalin] Itching and Nausea Only     gain weight   Penicillins Other (See Comments)    Pt unsure if there is an allergic reaction      Labs/Other Studies Reviewed    The following studies were reviewed today:  Cardiac Studies & Procedures     STRESS TESTS  MYOCARDIAL PERFUSION IMAGING 11/23/2018  Narrative  Nuclear stress EF: 56%.  The left ventricular ejection fraction is normal (55-65%).  There was no ST segment deviation noted during stress.  The study is normal.  This is a low risk study.  Normal stress nuclear study with no ischemia or infarction.  Gated ejection fraction 56% with normal wall motion.   ECHOCARDIOGRAM  ECHOCARDIOGRAM COMPLETE 08/28/2022  Narrative ECHOCARDIOGRAM REPORT    Patient Name:   [name] Date of Exam: 08/28/2022 Medical Rec #:  [id number]      Height:       66.0 in Accession #:    [id number]     Weight:       187.0 lb Date of Birth:  [birth year]       BSA:          1.943 m Patient Age:    76 years       BP:           179/63 mmHg Patient Gender: F              HR:           59 bpm. Exam Location:  Inpatient  Procedure: 2D Echo, Color Doppler and Cardiac Doppler  Indications:    Dyspnea  History:        Patient has prior history of Echocardiogram examinations, most recent 01/26/2017. Stroke and PVD; Risk Factors:Dyslipidemia, Diabetes and Hypertension. CKD.  Referring Phys: [id number] [facility]   Sonographer Comments: Suboptimal parasternal window and suboptimal apical window. Image acquisition challenging due to patient body habitus and Image acquisition challenging due to respiratory motion. IMPRESSIONS   1. Left ventricular ejection fraction, by estimation, is 60 to 65%. The left ventricle has normal function. The left ventricle  has no regional wall motion abnormalities. There is moderate left ventricular hypertrophy of the infero-lateral segment. Left ventricular diastolic parameters are consistent with Grade I diastolic dysfunction (impaired relaxation). Elevated left ventricular end-diastolic pressure. 2. Right ventricular systolic function is normal. The right ventricular size is normal. 3. The mitral valve is normal in structure. No evidence of mitral valve regurgitation. No evidence of mitral stenosis. 4. The aortic valve is tricuspid. Aortic valve regurgitation is not visualized. Aortic valve sclerosis/calcification is present, without any evidence of aortic stenosis. 5. The inferior vena cava is normal in size with greater than 50% respiratory variability,  suggesting right atrial pressure of 3 mmHg.  FINDINGS Left Ventricle: Left ventricular ejection fraction, by estimation, is 60 to 65%. The left ventricle has normal function. The left ventricle has no regional wall motion abnormalities. The left ventricular internal cavity size was normal in size. There is moderate left ventricular hypertrophy of the infero-lateral segment. Left ventricular diastolic parameters are consistent with Grade I diastolic dysfunction (impaired relaxation). Elevated left ventricular end-diastolic pressure.  Right Ventricle: The right ventricular size is normal. No increase in right ventricular wall thickness. Right ventricular systolic function is normal.  Left Atrium: Left atrial size was normal in size.  Right Atrium: Right atrial size was normal in size.  Pericardium: There is no evidence of pericardial effusion.  Mitral Valve: The mitral valve is normal in structure. No evidence of mitral valve regurgitation. No evidence of mitral valve stenosis. MV peak gradient, 7.5 mmHg. The mean mitral valve gradient is 2.0 mmHg.  Tricuspid Valve: The tricuspid valve is normal in structure. Tricuspid valve regurgitation is trivial. No evidence  of tricuspid stenosis.  Aortic Valve: The aortic valve is tricuspid. Aortic valve regurgitation is not visualized. Aortic valve sclerosis/calcification is present, without any evidence of aortic stenosis.  Pulmonic Valve: The pulmonic valve was normal in structure. Pulmonic valve regurgitation is not visualized. No evidence of pulmonic stenosis.  Aorta: The aortic root is normal in size and structure.  Venous: The inferior vena cava is normal in size with greater than 50% respiratory variability, suggesting right atrial pressure of 3 mmHg.  IAS/Shunts: No atrial level shunt detected by color flow Doppler.   LEFT VENTRICLE PLAX 2D LVIDd:         4.30 cm     Diastology LVIDs:         3.30 cm     LV e' medial:    3.41 cm/s LV PW:         1.50 cm     LV E/e' medial:  19.7 LV IVS:        0.90 cm     LV e' lateral:   4.38 cm/s LVOT diam:     2.20 cm     LV E/e' lateral: 15.4 LV SV:         104 LV SV Index:   54 LVOT Area:     3.80 cm  LV Volumes (MOD) LV vol d, MOD A2C: 65.4 ml LV vol d, MOD A4C: 66.1 ml LV vol s, MOD A2C: 25.3 ml LV vol s, MOD A4C: 23.2 ml LV SV MOD A2C:     40.1 ml LV SV MOD A4C:     66.1 ml LV SV MOD BP:      43.4 ml  RIGHT VENTRICLE             IVC RV S prime:     11.30 cm/s  IVC diam: 2.00 cm TAPSE (M-mode): 1.9 cm  LEFT ATRIUM             Index        RIGHT ATRIUM          Index LA diam:        3.60 cm 1.85 cm/m   RA Area:     9.89 cm LA Vol (A2C):   63.4 ml 32.62 ml/m  RA Volume:   20.00 ml 10.29 ml/m LA Vol (A4C):   48.0 ml 24.70 ml/m LA Biplane Vol: 55.6 ml 28.61 ml/m AORTIC VALVE LVOT Vmax:   116.00 cm/s LVOT Vmean:  79.700 cm/s LVOT VTI:    0.274 m  AORTA Ao Root diam: 2.80 cm Ao Asc diam:  3.40 cm  MITRAL VALVE                TRICUSPID VALVE MV Area (PHT): 2.20 cm     TR Peak grad:   7.8 mmHg MV Area VTI:   2.71 cm     TR Vmax:        140.00 cm/s MV Peak grad:  7.5 mmHg MV Mean grad:  2.0 mmHg     SHUNTS MV Vmax:       1.37 m/s      Systemic VTI:  0.27 m MV Vmean:      54.7 cm/s    Systemic Diam: 2.20 cm MV Decel Time: 345 msec MV E velocity: 67.30 cm/s MV A velocity: 116.00 cm/s MV E/A ratio:  0.58  Armanda Magic MD Electronically signed by Armanda Magic MD Signature Date/Time: 08/28/2022/3:25:02 PM    Final    MONITORS  LONG TERM MONITOR (3-14 DAYS) 01/21/2020  Narrative NSR Rare runs of SVT with the longest run being 9 beats. No sustained arrhythmias.          Recent Labs: 08/04/2022: Magnesium 2.3 08/28/2022: B Natriuretic Peptide 75.3; TSH 3.915 09/06/2022: ALT 14; Platelets 253 03/15/2023: BUN 52; Creatinine, Ser 2.20; Hemoglobin 15.0; Potassium 5.1; Sodium 138  Recent Lipid Panel    Component Value Date/Time   CHOL 152 08/04/2022 1509   TRIG 154 (H) 08/04/2022 1509   HDL 54 08/04/2022 1509   CHOLHDL 2.8 08/04/2022 1509   VLDL 25.8 01/22/2022 0828   LDLCALC 74 08/04/2022 1509   LDLDIRECT 146.4 01/30/2010 1056    History of Present Illness    77 year old female with the above past medical history including coronary artery calcification noted on CT, aortic atherosclerosis, PSVT, PVD, carotid artery stenosis, hypertension, hyperlipidemia, brain aneurysm, CVA, type 2 diabetes, CKD stage IV, COPD, pulmonary nodules, tobacco use, and anxiety.  She has evidence of coronary artery calcification, aortic atherosclerosis on prior CTs.  Lexiscan Myoview in 2020 was negative for ischemia.  Cardiac monitor in May 2021 showed rare runs of SVT, no sustained arrhythmia.  She was hospitalized in December 2023 in the setting of hypertensive urgency, chest pain, shortness of breath.  She also noted dizziness, blurred vision.  MRI showed no acute abnormalities.  Troponin was mildly elevated with flat trend.  Echocardiogram at the time showed EF 60 to 65%, normal LV function, no RWMA, moderate LVH, G1 DD, normal RV, aortic sclerosis without evidence of stenosis. Additionally, she has a history of peripheral vascular  disease s/p left superficial femoral artery stenting in 2012 with early ISR, s/p balloon angioplasty which ultimately failed.  She follows with vascular surgery.  She underwent left femoral to below-knee popliteal artery bypass graft with vein in March 2023.  She developed distal anastomotic stenosis which required intervention on 03/09/2022.  More recently, she underwent drug-coated balloon angioplasty to left femoral-popliteal bypass graft in June 2024.  She was last seen in the office by cardiology on 01/11/2022 and was stable from a cardiac standpoint.  She denied any symptoms concerning for angina.  BP was well-controlled.  She contacted our office on 03/28/2023 with concern for elevated BP (SBP between 180 and 200), shortness of breath, and palpitations.   She presents today for follow-up with a myriad of concerns. Her biggest concern is that her blood pressure has been significantly elevated since the time of her  hospitalization in December.  Additionally, she notes increased shortness of breath both at rest and with exertion, palpitations which she describes as her heart "pounding."  She notes occasional chest tightness.  She also notes noncardiac symptoms including tremors, involuntary movements, headaches, blurred vision, itching, swelling in her hands, face, and legs, as well as leg cramps.  She is concerned that many of her symptoms possibly side effects from her current medications. She stopped taking her buspirone, citalopram, and rosuvastatin.  She is very frustrated, anxious, and wants to feel better.    Home Medications    Current Outpatient Medications  Medication Sig Dispense Refill   acetaminophen (TYLENOL) 500 MG tablet Take 1,000 mg by mouth every 6 (six) hours as needed for mild pain.     amLODipine (NORVASC) 10 MG tablet Take 1 tablet (10 mg total) by mouth daily. for blood pressure 90 tablet 3   aspirin 325 MG EC tablet Take 325 mg by mouth at bedtime.     Blood Glucose Monitoring  Suppl (ONETOUCH VERIO FLEX SYSTEM) w/Device KIT USE AS ADVISED 1 kit 0   busPIRone (BUSPAR) 5 MG tablet TAKE 1 TABLET BY MOUTH THREE TIMES A DAY (Patient taking differently: Take 5 mg by mouth 2 (two) times daily.) 270 tablet 1   carvedilol (COREG) 3.125 MG tablet Take 1 tablet (3.125 mg total) by mouth 2 (two) times daily with a meal. 180 tablet 3   citalopram (CELEXA) 40 MG tablet TAKE 1 TABLET BY MOUTH EVERY DAY IN THE MORNING (Patient taking differently: Take 40 mg by mouth daily.) 90 tablet 2   dapagliflozin propanediol (FARXIGA) 10 MG TABS tablet Take 1 tablet (10 mg total) by mouth daily. 90 tablet 3   glucose blood test strip Use as instructed 3x a day - One Touch Verio 300 each 3   hydrALAZINE (APRESOLINE) 25 MG tablet Take 1 tablet (25 mg total) by mouth 3 (three) times daily. 270 tablet 3   insulin aspart (NOVOLOG FLEXPEN) 100 UNIT/ML FlexPen Inject 10-14 Units into the skin 3 (three) times daily before meals. (Patient taking differently: Inject 14 Units into the skin 2 (two) times daily before a meal.) 45 mL 3   insulin degludec (TRESIBA FLEXTOUCH) 200 UNIT/ML FlexTouch Pen Inject 40 Units into the skin daily. 18 mL 3   meclizine (ANTIVERT) 25 MG tablet 1/2 tab up to 3 times daily for motion sickness/dizziness. (Patient taking differently: Take 12.5 mg by mouth 3 (three) times daily as needed for dizziness. 1/2 tab up to 3 times daily for motion sickness/dizziness.) 30 tablet 0   Misc Natural Products (BRAINSTRONG MEMORY SUPPORT PO) Take 1 capsule by mouth daily.     Multiple Vitamins-Minerals (MULTIVITAMIN WITH MINERALS) tablet Take 1 tablet by mouth daily.     Omega-3 Fatty Acids (FISH OIL) 1000 MG CAPS Take 1,000 mg by mouth in the morning and at bedtime.     OneTouch Delica Lancets 33G MISC Use 3x a day 200 each 3   pantoprazole (PROTONIX) 40 MG tablet Take 1 tablet (40 mg total) by mouth daily. 90 tablet 1   Polyethyl Glycol-Propyl Glycol (SYSTANE ULTRA OP) Place 1 drop into both eyes  daily at 12 noon. Additional drop if needed for dry eyes     rOPINIRole (REQUIP) 0.25 MG tablet TAKE 1 TABLET BY MOUTH EVERYDAY AT BEDTIME 90 tablet 2   rosuvastatin (CRESTOR) 40 MG tablet Take 1 tablet (40 mg total) by mouth daily. 90 tablet 1   olmesartan (BENICAR) 40  MG tablet Take 1 tablet (40 mg total) by mouth daily. for blood pressure 90 tablet 3   No current facility-administered medications for this visit.     Review of Systems    She denies pnd, orthopnea, n, v, dizziness, syncope, weight gain, or early satiety. All other systems reviewed and are otherwise negative except as noted above.   Physical Exam    VS:  BP (!) 184/70 (BP Location: Left Arm, Patient Position: Sitting, Cuff Size: Normal)   Pulse 64   Ht 5\' 6"  (1.676 m)   Wt 190 lb 9.6 oz (86.5 kg)   SpO2 94%   BMI 30.76 kg/m   GEN: Well nourished, well developed, in no acute distress. HEENT: normal. Neck: Supple, no JVD, R carotid bruits, no masses. Cardiac: RRR, no murmurs, rubs, or gallops. No clubbing, cyanosis, nonpitting bilateral lower extremity edema, left greater than right. Radials/DP/PT 1+ and equal bilaterally.  Respiratory:  Respirations regular and unlabored, clear to auscultation bilaterally. GI: Soft, nontender, nondistended, BS + x 4. MS: no deformity or atrophy. Skin: warm and dry, no rash. Neuro:  Strength and sensation are intact. Psych: Normal affect.  Accessory Clinical Findings    ECG personally reviewed by me today - EKG Interpretation Date/Time:  Thursday March 31 2023 14:57:23 EDT Ventricular Rate:  64 PR Interval:  132 QRS Duration:  96 QT Interval:  442 QTC Calculation: 455 R Axis:   28  Text Interpretation: Normal sinus rhythm Normal ECG When compared with ECG of 27-Aug-2022 18:54, PREVIOUS ECG IS PRESENT Confirmed by Bernadene Person ([id number]) on 03/31/2023 3:18:53 PM  - no acute changes.   Lab Results  Component Value Date   WBC 10.1 09/06/2022   HGB 15.0 03/15/2023   HCT 44.0  03/15/2023   MCV 97.9 09/06/2022   PLT 253 09/06/2022   Lab Results  Component Value Date   CREATININE 2.20 (H) 03/15/2023   BUN 52 (H) 03/15/2023   NA 138 03/15/2023   K 5.1 03/15/2023   CL 108 03/15/2023   CO2 32 09/06/2022   Lab Results  Component Value Date   ALT 14 09/06/2022   AST 23 09/06/2022   ALKPHOS 75 11/18/2021   BILITOT 0.4 09/06/2022   Lab Results  Component Value Date   CHOL 152 08/04/2022   HDL 54 08/04/2022   LDLCALC 74 08/04/2022   LDLDIRECT 146.4 01/30/2010   TRIG 154 (H) 08/04/2022   CHOLHDL 2.8 08/04/2022    Lab Results  Component Value Date   HGBA1C 7.7 (A) 02/07/2023    Assessment & Plan    1. Hypertension: BP has been significantly elevated since December 2023.  She reports symptoms of headache, blurred vision, and a pounding in her chest.  She has not taken her olmesartan for the past 2 to 3 days.  She was previously on bisoprolol-HCTZ, later chlorthalidone, and hydralazine.he is no longer taking his medications and is uncertain why they were discontinued.  Given CKD would prefer to avoid any significantly nephrotoxic agents.  She has had recent palpitations as below.  Will start carvedilol 3.125 mg twice daily.  Will also reintroduce hydralazine 25 mg 3 times daily.  Will refer to hypertension clinic Pharm.D. for ongoing management given resistant hypertension.  Continue to monitor BP and report BP consistent greater than 150.  Continue olmesartan, amlodipine.  2. Shortness of breath/Coronary artery calcification on CT/Aortic atherosclerosis: She notes increased shortness of breath both at rest and with exertion, intermittent chest tightness.  Recent echo in  08/2022 was essentially normal.  She has stable nonpitting bilateral lower extremity edema, left greater than right.  Denies PND, orthopnea, weight gain. We discussed possible ischemic evaluation (given CKD stage IV, would avoid coronary CT angiogram and consider Lexiscan Myoview versus cardiac PET  stress test).  She prefers to wait.  Reviewed ED precautions.  Will check BNP, CMET, CBC. Continue aspirin, Crestor as below.  3. Palpitations/PSVT: Cardiac monitor in May 2021 showed rare runs of SVT, no sustained arrhythmia.  She notes recent palpitations which she describes as her heart pounding.  These occur mostly at night when she is resting in bed.  She denies any associated symptoms.  Will check 14-day ZIO, TSH, magnesium, CBC and BMET as above. Will start carvedilol as above.  4. PVD/carotid artery stenosis:  S/p left superficial femoral artery stenting in 2012 with early ISR, s/p balloon angioplasty which ultimately failed. More recently, she underwent drug-coated balloon angioplasty to left femoral-popliteal bypass graft in June 2024.  Carotid ultrasound in 02/2023 revealed 40 to 59% R ICA stenosis, 1 to 39% LICA stenosis.  Follows with vascular surgery.   5. Hyperlipidemia: LDL was 74 in 07/2022. Given symptoms of leg cramps patient's concern for medication side effects, will trial statin holiday.  If her symptoms do not improve, will plan to resume statin.  6. Type 2 diabetes: A1c was 7.7 in 01/2023.  Follows with endocrinology.  7. CKD stage IV: Most recent creatinine was 2.2 on 03/15/2023.  Follows with nephrology.  8. Tobacco use/pulmonary nodules: She continues to smoke approximately 2 cigarettes a day.  Full cessation advised. Recommend follow-up with PCP regarding CT of chest for screening of pulmonary nodules. If cardiac workup reassuring, consider referral to pulmonology if shortness of breath persists.   9. Disposition: Follow-up with hypertension clinic Pharm.D.  Follow-up with APP in 2 to 3 months.  Joylene Grapes, NP 03/31/2023, 9:12 PM

## 2023-03-31 NOTE — Progress Notes (Unsigned)
Enrolled patient for a 14 day Zio XT monitor to be mailed to patients home  [place] to read 

## 2023-03-31 NOTE — Patient Instructions (Signed)
Medication Instructions:  Start Carvedilol 3.125 mg twice daily Start Hydralazine 25 mg three times daily  *If you need a refill on your cardiac medications before your next appointment, please call your pharmacy*   Lab Work: BNP, CMET, CBC, TSH, Magnesium today   Testing/Procedures: ZIO AT Long term monitor-Live Telemetry  Your physician has requested you wear a ZIO patch monitor for 14 days.  This is a single patch monitor. [employer] supplies one patch monitor per enrollment. Additional  stickers are not available.  Please do not apply patch if you will be having a Nuclear Stress Test, Echocardiogram, Cardiac CT, MRI,  or Chest Xray during the period you would be wearing the monitor. The patch cannot be worn during  these tests. You cannot remove and re-apply the ZIO AT patch monitor.  Your ZIO patch monitor will be mailed 3 day USPS to your address on file. It may take 3-5 days to  receive your monitor after you have been enrolled.  Once you have received your monitor, please review the enclosed instructions. Your monitor has  already been registered assigning a specific monitor serial # to you.   Billing and Patient Assistance Program information  Meredeth Ide has been supplied with any insurance information on record for billing. [employer] offers a sliding scale Patient Assistance Program for patients without insurance, or whose  insurance does not completely cover the cost of the ZIO patch monitor. You must apply for the  Patient Assistance Program to qualify for the discounted rate. To apply, call [employer] at [phone],  select option 4, select option 2 , ask to apply for the Patient Assistance Program, (you can request an  interpreter if needed). [employer] will ask your household income and how many people are in your  household. [employer] will quote your out-of-pocket cost based on this information. They will also be able  to set up a 12 month interest free payment plan if  needed.  Applying the monitor   Shave hair from upper left chest.  Hold the abrader disc by orange tab. Rub the abrader in 40 strokes over left upper chest as indicated in  your monitor instructions.  Clean area with 4 enclosed alcohol pads. Use all pads to ensure the area is cleaned thoroughly. Let  dry.  Apply patch as indicated in monitor instructions. Patch will be placed under collarbone on left side of  chest with arrow pointing upward.  Rub patch adhesive wings for 2 minutes. Remove the white label marked "1". Remove the white label  marked "2". Rub patch adhesive wings for 2 additional minutes.  While looking in a mirror, press and release button in center of patch. A small green light will flash 3-4  times. This will be your only indicator that the monitor has been turned on.  Do not shower for the first 24 hours. You may shower after the first 24 hours.  Press the button if you feel a symptom. You will hear a small click. Record Date, Time and Symptom in  the Patient Log.   Starting the Gateway  In your kit there is a Audiological scientist box the size of a cellphone. This is Buyer, retail. It transmits all your  recorded data to [facility]. This box must always stay within 10 feet of you. Open the box and push the *  button. There will be a light that blinks orange and then green a few times. When the light stops  blinking, the Gateway is connected to the  ZIO patch. Call [employer] at [phone] to confirm your monitor is transmitting.  Returning your monitor  Remove your patch and place it inside the Gateway. In the lower half of the Gateway there is a white  bag with prepaid postage on it. Place Gateway in bag and seal. Mail package back to [place] as soon as  possible. Your physician should have your final report approximately 7 days after you have mailed back  your monitor. Call [facility] Customer Care at [phone] if you have questions regarding your ZIO AT  patch  monitor. Call them immediately if you see an orange light blinking on your monitor.  If your monitor falls off in less than 4 days, contact our Monitor department at [phone]. If your  monitor becomes loose or falls off after 4 days call [employer] at [phone] for suggestions on  securing your monitor    Follow-Up: At [facility], you and your health needs are our priority.  As part of our continuing mission to provide you with exceptional heart care, we have created designated Provider Care Teams.  These Care Teams include your primary Cardiologist (physician) and Advanced Practice Providers (APPs -  Physician Assistants and Nurse Practitioners) who all work together to provide you with the care you need, when you need it.  We recommend signing up for the patient portal called "MyChart".  Sign up information is provided on this After Visit Summary.  MyChart is used to connect with patients for Virtual Visits (Telemedicine).  Patients are able to view lab/test results, encounter notes, upcoming appointments, etc.  Non-urgent messages can be sent to your provider as well.   To learn more about what you can do with MyChart, go to [email or web address].    Your next appointment:   2-3 month(s)  Provider:   Bernadene Person, NP        Other Instructions Please call our office if your systolic blood pressure (top number) is greater than 150.  Referral sent to Pharm D

## 2023-04-01 LAB — BRAIN NATRIURETIC PEPTIDE: BNP: 26.7 pg/mL (ref 0.0–100.0)

## 2023-04-01 LAB — COMPREHENSIVE METABOLIC PANEL
ALT: 18 IU/L (ref 0–32)
AST: 26 IU/L (ref 0–40)
Albumin: 3.9 g/dL (ref 3.8–4.8)
Alkaline Phosphatase: 122 IU/L — ABNORMAL HIGH (ref 44–121)
BUN/Creatinine Ratio: 15 (ref 12–28)
BUN: 32 mg/dL — ABNORMAL HIGH (ref 8–27)
Bilirubin Total: 0.3 mg/dL (ref 0.0–1.2)
CO2: 26 mmol/L (ref 20–29)
Calcium: 9.8 mg/dL (ref 8.7–10.3)
Chloride: 102 mmol/L (ref 96–106)
Creatinine, Ser: 2.15 mg/dL — ABNORMAL HIGH (ref 0.57–1.00)
Globulin, Total: 3.2 g/dL (ref 1.5–4.5)
Glucose: 244 mg/dL — ABNORMAL HIGH (ref 70–99)
Potassium: 4.7 mmol/L (ref 3.5–5.2)
Sodium: 142 mmol/L (ref 134–144)
Total Protein: 7.1 g/dL (ref 6.0–8.5)
eGFR: 23 mL/min/{1.73_m2} — ABNORMAL LOW (ref >59)

## 2023-04-01 LAB — CBC
Hematocrit: 46.7 % — ABNORMAL HIGH (ref 34.0–46.6)
Hemoglobin: 15.2 g/dL (ref 11.1–15.9)
MCH: 32.5 pg (ref 26.6–33.0)
MCHC: 32.5 g/dL (ref 31.5–35.7)
MCV: 100 fL — ABNORMAL HIGH (ref 79–97)
Platelets: 233 10*3/uL (ref 150–450)
RBC: 4.68 x10E6/uL (ref 3.77–5.28)
RDW: 11.4 % — ABNORMAL LOW (ref 11.7–15.4)
WBC: 7.7 10*3/uL (ref 3.4–10.8)

## 2023-04-01 LAB — TSH: TSH: 1.78 u[IU]/mL (ref 0.450–4.500)

## 2023-04-04 DIAGNOSIS — R002 Palpitations: Secondary | ICD-10-CM | POA: Diagnosis not present

## 2023-04-05 DIAGNOSIS — N184 Chronic kidney disease, stage 4 (severe): Secondary | ICD-10-CM | POA: Diagnosis not present

## 2023-04-07 ENCOUNTER — Telehealth: Payer: Self-pay

## 2023-04-07 ENCOUNTER — Ambulatory Visit
Payer: Medicare Other | Attending: Internal Medicine | Admitting: Pharmacist Clinician (PhC)/ Clinical Pharmacy Specialist

## 2023-04-07 VITALS — BP 169/69 | HR 68

## 2023-04-07 DIAGNOSIS — I10 Essential (primary) hypertension: Secondary | ICD-10-CM | POA: Diagnosis not present

## 2023-04-07 NOTE — Patient Instructions (Signed)
Follow up appointment:  I will call you in about a month to see how your BP is doing  Take your BP meds as follows: continue with your current medications  Check your blood pressure at home daily (if able) and keep record of the readings.  Hypertension "High blood pressure"  Hypertension is often called "The Silent Killer." It rarely causes symptoms until it is extremely  high or has done damage to other organs in the body. For this reason, you should have your  blood pressure checked regularly by your physician. We will check your blood pressure  every time you see a provider at one of our offices.   Your blood pressure reading consists of two numbers. Ideally, blood pressure should be  below 120/80. The first ("top") number is called the systolic pressure. It measures the  pressure in your arteries as your heart beats. The second ("bottom") number is called the diastolic pressure. It measures the pressure in your arteries as the heart relaxes between beats.  The benefits of getting your blood pressure under control are enormous. A 10-point  reduction in systolic blood pressure can reduce your risk of stroke by 27% and heart failure by 28%  Your blood pressure goal is <130/80  To check your pressure at home you will need to:  1. Sit up in a chair, with feet flat on the floor and back supported. Do not cross your ankles or legs. 2. Rest your left arm so that the cuff is about heart level. If the cuff goes on your upper arm,  then just relax the arm on the table, arm of the chair or your lap. If you have a wrist cuff, we  suggest relaxing your wrist against your chest (think of it as Pledging the Flag with the  wrong arm).  3. Place the cuff snugly around your arm, about 1 inch above the crook of your elbow. The  cords should be inside the groove of your elbow.  4. Sit quietly, with the cuff in place, for about 5 minutes. After that 5 minutes press the power  button to start a  reading. 5. Do not talk or move while the reading is taking place.  6. Record your readings on a sheet of paper. Although most cuffs have a memory, it is often  easier to see a pattern developing when the numbers are all in front of you.  7. You can repeat the reading after 1-3 minutes if it is recommended  Make sure your bladder is empty and you have not had caffeine or tobacco within the last 30 min  Always bring your blood pressure log with you to your appointments. If you have not brought your monitor in to be double checked for accuracy, please bring it to your next appointment.  You can find a list of quality blood pressure cuffs at validatebp.org

## 2023-04-07 NOTE — Telephone Encounter (Signed)
Spoke with pt. Pt was notified of lab results. Pt will continue current medication and f/u as planned.  

## 2023-04-07 NOTE — Progress Notes (Signed)
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, carotid stenosis  CVA L lateral thalamic infarct 01/2017  PVD L fem-pop 11/2021  HLD 111/23 LDL 74  DM2 11/23 A1c 7.6  CKD 7/24 SCr 2.15, GFR 23    Allergies  Allergen Reactions   Ciprofloxacin Swelling   Ozempic (0.25 Or 0.5 Mg-Dose) [Semaglutide(0.25 Or 0.5mg -Dos)] Nausea And Vomiting   Plavix [Clopidogrel Bisulfate] Palpitations   Amoxicillin Itching and Swelling    FACE & EYES SWELL   Ace Inhibitors Cough    Sore throat   Atorvastatin     myalgias   Lyrica [Pregabalin] Itching and Nausea Only     gain weight   Penicillins Other (See Comments)    Pt unsure if there is an allergic reaction     History of Present Illness    [name] is a 77 y.o. female patient of Dr Antoine Poche, in the office today for hypertension management.  She was nost recently seen by Bernadene Person NP just last week, with a BP noted to be 184/70.   At that time she had multiple complaints, including tremors, involuntary movements, headaches, blurred vision, itching, swelling in her hands, face and legs, and leg cramps.  She believed much of it to be side effects of medications, and stopped her buspirone, citalopram and rosuvastatin on her own.  She has been on multiple BP lowering medications, but there is uncertainty as to why many of them were discontinued.  (Olmesartan, bisoprolol-hctz, chlorthalidone and hydralazine).  Because of her poor renal function she was started on carvedilol 3.125 mg bid and asked to restart the hydralazine 25 mg tid.    It has been just a week since she saw Irving Burton.   Today she complains of "constant dizziness", but states she is taking her medications (below).  Her SOB is unchanged from last week when she was  started on carvedilol.    Blood Pressure Goal:  130/80  Current Medications:  amlodipine 10 mg every day, hydralazine 25 mg tid, carvedilol 3.125 mg bid, olmesartan 40 mg qd  Previously tried:  ACEI - cough  Family Hx:  mother died from SCD, 2 brothed died MI, father had MI  Social Hx:      Tobacco: down to about 2 cigarettes per day,   Alcohol: no   Caffeine: 1 cup of coffee in the morning, otherwise water all   Diet:   almost all at home, only adds minimal salt with cooking; variety of proteins, pot roasts or chicken in a bag; no fried meats; salad, broccoli regularly   Exercise: limited with SOB  Home BP readings: lowest 171, nothing > 200 this past week       Accessory Clinical Findings    Lab Results  Component Value Date   CREATININE 2.15 (H) 03/31/2023   BUN 32 (H) 03/31/2023   NA 142 03/31/2023   K 4.7 03/31/2023   CL 102 03/31/2023   CO2 26 03/31/2023   Lab Results  Component Value Date   ALT 18 03/31/2023   AST 26 03/31/2023   ALKPHOS 122 (H) 03/31/2023   BILITOT 0.3 03/31/2023   Lab Results  Component Value Date   HGBA1C 7.7 (A) 02/07/2023    Home Medications    Current  Outpatient Medications  Medication Sig Dispense Refill   acetaminophen (TYLENOL) 500 MG tablet Take 1,000 mg by mouth every 6 (six) hours as needed for mild pain.     amLODipine (NORVASC) 10 MG tablet Take 1 tablet (10 mg total) by mouth daily. for blood pressure 90 tablet 3   aspirin 325 MG EC tablet Take 325 mg by mouth at bedtime.     Blood Glucose Monitoring Suppl (ONETOUCH VERIO FLEX SYSTEM) w/Device KIT USE AS ADVISED 1 kit 0   busPIRone (BUSPAR) 5 MG tablet TAKE 1 TABLET BY MOUTH THREE TIMES A DAY (Patient taking differently: Take 5 mg by mouth 2 (two) times daily.) 270 tablet 1   carvedilol (COREG) 3.125 MG tablet Take 1 tablet (3.125 mg total) by mouth 2 (two) times daily with a meal. 180 tablet 3   citalopram (CELEXA) 40 MG tablet TAKE 1 TABLET BY MOUTH EVERY DAY IN THE  MORNING (Patient taking differently: Take 40 mg by mouth daily.) 90 tablet 2   dapagliflozin propanediol (FARXIGA) 10 MG TABS tablet Take 1 tablet (10 mg total) by mouth daily. 90 tablet 3   glucose blood test strip Use as instructed 3x a day - One Touch Verio 300 each 3   hydrALAZINE (APRESOLINE) 25 MG tablet Take 1 tablet (25 mg total) by mouth 3 (three) times daily. 270 tablet 3   insulin aspart (NOVOLOG FLEXPEN) 100 UNIT/ML FlexPen Inject 10-14 Units into the skin 3 (three) times daily before meals. (Patient taking differently: Inject 14 Units into the skin 2 (two) times daily before a meal.) 45 mL 3   insulin degludec (TRESIBA FLEXTOUCH) 200 UNIT/ML FlexTouch Pen Inject 40 Units into the skin daily. 18 mL 3   meclizine (ANTIVERT) 25 MG tablet 1/2 tab up to 3 times daily for motion sickness/dizziness. (Patient taking differently: Take 12.5 mg by mouth 3 (three) times daily as needed for dizziness. 1/2 tab up to 3 times daily for motion sickness/dizziness.) 30 tablet 0   Misc Natural Products (BRAINSTRONG MEMORY SUPPORT PO) Take 1 capsule by mouth daily.     Multiple Vitamins-Minerals (MULTIVITAMIN WITH MINERALS) tablet Take 1 tablet by mouth daily.     olmesartan (BENICAR) 40 MG tablet Take 1 tablet (40 mg total) by mouth daily. for blood pressure 90 tablet 3   Omega-3 Fatty Acids (FISH OIL) 1000 MG CAPS Take 1,000 mg by mouth in the morning and at bedtime.     OneTouch Delica Lancets 33G MISC Use 3x a day 200 each 3   pantoprazole (PROTONIX) 40 MG tablet Take 1 tablet (40 mg total) by mouth daily. 90 tablet 1   Polyethyl Glycol-Propyl Glycol (SYSTANE ULTRA OP) Place 1 drop into both eyes daily at 12 noon. Additional drop if needed for dry eyes     rOPINIRole (REQUIP) 0.25 MG tablet TAKE 1 TABLET BY MOUTH EVERYDAY AT BEDTIME 90 tablet 2   rosuvastatin (CRESTOR) 40 MG tablet TAKE 1 TABLET BY MOUTH EVERY DAY 90 tablet 1   No current facility-administered medications for this visit.       Assessment & Plan    Essential hypertension Assessment: BP is uncontrolled in office BP 169/69 mmHg;  above the goal (<130/80). Has only restarted medication in the past week Complains of SOB and dizziness Reiterated the importance of regular exercise and low salt diet   Plan:  Continue taking current medications Stressed to patient that if symptoms worsen she would need to call 911 or go to ED Patient to  keep record of BP readings with heart rate and report to [place] at the next visit Patient to follow up with Bernadene Person NP in September Labs ordered today:  none   Phillips Hay PharmD CPP [facility]  [address] [place], [place] [address] [phone]

## 2023-04-09 ENCOUNTER — Other Ambulatory Visit: Payer: Self-pay | Admitting: Nurse Practitioner

## 2023-04-09 DIAGNOSIS — E785 Hyperlipidemia, unspecified: Secondary | ICD-10-CM

## 2023-04-11 ENCOUNTER — Encounter: Payer: Self-pay | Admitting: Pharmacist Clinician (PhC)/ Clinical Pharmacy Specialist

## 2023-04-11 NOTE — Assessment & Plan Note (Signed)
Assessment: BP is uncontrolled in office BP 169/69 mmHg;  above the goal (<130/80). Has only restarted medication in the past week Complains of SOB and dizziness Reiterated the importance of regular exercise and low salt diet   Plan:  Continue taking current medications Stressed to patient that if symptoms worsen she would need to call 911 or go to ED Patient to keep record of BP readings with heart rate and report to [place] at the next visit Patient to follow up with Bernadene Person NP in September Labs ordered today:  none

## 2023-04-13 DIAGNOSIS — N184 Chronic kidney disease, stage 4 (severe): Secondary | ICD-10-CM | POA: Diagnosis not present

## 2023-04-13 DIAGNOSIS — R609 Edema, unspecified: Secondary | ICD-10-CM | POA: Diagnosis not present

## 2023-04-13 DIAGNOSIS — D631 Anemia in chronic kidney disease: Secondary | ICD-10-CM | POA: Diagnosis not present

## 2023-04-13 DIAGNOSIS — Z720 Tobacco use: Secondary | ICD-10-CM | POA: Diagnosis not present

## 2023-04-13 DIAGNOSIS — R0602 Shortness of breath: Secondary | ICD-10-CM | POA: Diagnosis not present

## 2023-04-13 DIAGNOSIS — I129 Hypertensive chronic kidney disease with stage 1 through stage 4 chronic kidney disease, or unspecified chronic kidney disease: Secondary | ICD-10-CM | POA: Diagnosis not present

## 2023-04-13 DIAGNOSIS — N2581 Secondary hyperparathyroidism of renal origin: Secondary | ICD-10-CM | POA: Diagnosis not present

## 2023-04-18 ENCOUNTER — Ambulatory Visit (INDEPENDENT_AMBULATORY_CARE_PROVIDER_SITE_OTHER): Payer: Medicare Other | Admitting: Physician Assistant

## 2023-04-18 ENCOUNTER — Ambulatory Visit (HOSPITAL_COMMUNITY)
Admission: RE | Admit: 2023-04-18 | Discharge: 2023-04-18 | Disposition: A | Discharge status: Home / Self Care | Payer: Medicare Other | Source: Ambulatory Visit | Attending: Surgery | Admitting: Surgery

## 2023-04-18 ENCOUNTER — Ambulatory Visit (INDEPENDENT_AMBULATORY_CARE_PROVIDER_SITE_OTHER)
Admission: RE | Admit: 2023-04-18 | Discharge: 2023-04-18 | Disposition: A | Discharge status: Home / Self Care | Payer: Medicare Other | Source: Ambulatory Visit | Attending: Surgery | Admitting: Surgery

## 2023-04-18 VITALS — BP 172/83 | HR 65 | Temp 98.3°F | Resp 18 | Ht 66.0 in | Wt 194.0 lb

## 2023-04-18 DIAGNOSIS — F17200 Nicotine dependence, unspecified, uncomplicated: Secondary | ICD-10-CM | POA: Diagnosis not present

## 2023-04-18 DIAGNOSIS — I70213 Atherosclerosis of native arteries of extremities with intermittent claudication, bilateral legs: Secondary | ICD-10-CM

## 2023-04-18 LAB — VAS US ABI WITH/WO TBI
Left ABI: 0.82
Right ABI: 0.47

## 2023-04-18 NOTE — Progress Notes (Signed)
Office Note     CC:  follow up Requesting Provider:  Lucky Cowboy, MD  HPI: JALICIA SCHWARTZENBERGE is a 77 y.o. ([birth year]) female who presents status post drug-coated balloon angioplasty of the distal left femoral to popliteal bypass graft due to recurrent stenosis and a threatened bypass graft.  This was performed by Dr. Myra Gianotti on 03/15/2023.  The left ankle wound has healed and has not returned.  She does have edema on the left leg however symptoms are tolerable.  She has recently been started on Lasix which has seemed to help with some of her edema symptoms.  She denies any rest pain or tissue loss of bilateral lower extremities.  She is on a statin and aspirin daily.  She continues to smoke about 4 to 5 cigarettes a day.  She states her husband has recently quit.   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,  "small"   Anxiety    Arthritis    Cataract    CKD (chronic kidney disease), stage IV (HCC)    followed by [facility]   Colon polyps    Diabetes mellitus 1998   Diverticulosis    Emphysema of lung (HCC)    "no one has evry told me that"   ESOPHAGEAL STRICTURE 08/27/2008   Family history of adverse reaction to anesthesia    daughter n/v   GERD (gastroesophageal reflux disease)    Hemorrhoids    Hiatal hernia    Hyperlipidemia    Hypertension 2000   Migraines    Neuropathy    fingers and toe   Peripheral vascular disease (HCC)    Phlebitis    30 years ago  left leg   Stroke [facility])    multiple mini strokes ( brain aneurysm ).  Right side weaker.    Past Surgical History:  Procedure Laterality Date   ABDOMINAL AORTAGRAM N/A 01/04/2012   Procedure: ABDOMINAL AORTAGRAM;  Surgeon: Nada Libman, MD;  Location: [facility] CATH LAB;  Service: Cardiovascular;  Laterality: N/A;   ABDOMINAL AORTAGRAM N/A 08/15/2012   Procedure: ABDOMINAL Ronny Flurry;  Surgeon: Nada Libman, MD;   Location: [facility] CATH LAB;  Service: Cardiovascular;  Laterality: N/A;   ABDOMINAL AORTOGRAM W/LOWER EXTREMITY Left 11/17/2021   Procedure: ABDOMINAL AORTOGRAM W/LOWER EXTREMITY;  Surgeon: Nada Libman, MD;  Location: [facility] INVASIVE CV LAB;  Service: Cardiovascular;  Laterality: Left;   ABDOMINAL AORTOGRAM W/LOWER EXTREMITY N/A 03/09/2022   Procedure: ABDOMINAL AORTOGRAM W/LOWER EXTREMITY;  Surgeon: Nada Libman, MD;  Location: [facility] INVASIVE CV LAB;  Service: Cardiovascular;  Laterality: N/A;   ABDOMINAL AORTOGRAM W/LOWER EXTREMITY N/A 03/15/2023   Procedure: ABDOMINAL AORTOGRAM W/LOWER EXTREMITY;  Surgeon: Nada Libman, MD;  Location: [facility] INVASIVE CV LAB;  Service: Cardiovascular;  Laterality: N/A;   ABDOMINAL HYSTERECTOMY  1984   cataract surgery Right 10-22-2015   cataract surgery Left 11-05-2015   CESAREAN SECTION     CHOLECYSTECTOMY     COLONOSCOPY  07/2010   DENTAL SURGERY  Aug. 16, 2013   left lower    ESOPHAGOGASTRODUODENOSCOPY  07/2010   EYE SURGERY  Nov. 2014   Laser-Glaucoma   FEMORAL ARTERY STENT  05/11/11   Left superficial femoral and popliteal artery   FEMORAL-POPLITEAL BYPASS GRAFT Left 11/18/2021   Procedure: LEFT FEMORAL-POPLITEAL ARTERY BYPASS GRAFT;  Surgeon: Nada Libman, MD;  Location: [facility] OR;  Service: Vascular;  Laterality: Left;  HERNIA REPAIR     times two   KNEE SURGERY     LOWER EXTREMITY ANGIOGRAM Left 08/15/2012   Procedure: LOWER EXTREMITY ANGIOGRAM;  Surgeon: Nada Libman, MD;  Location: [facility] CATH LAB;  Service: Cardiovascular;  Laterality: Left;  lt leg angio   PERIPHERAL VASCULAR BALLOON ANGIOPLASTY Left 03/09/2022   Procedure: PERIPHERAL VASCULAR BALLOON ANGIOPLASTY;  Surgeon: Nada Libman, MD;  Location: [facility] INVASIVE CV LAB;  Service: Cardiovascular;  Laterality: Left;   PERIPHERAL VASCULAR BALLOON ANGIOPLASTY Left 03/15/2023   Procedure: PERIPHERAL VASCULAR BALLOON ANGIOPLASTY;  Surgeon: Nada Libman, MD;  Location: [facility] INVASIVE CV LAB;   Service: Cardiovascular;  Laterality: Left;   rotator cuff surgery     THYROID SURGERY      Social History   Socioeconomic History   Marital status: Married    Spouse name: Not on file   Number of children: 2   Years of education: Not on file   Highest education level: Not on file  Occupational History   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  Tobacco Use   Smoking status: Light Smoker    Current packs/day: 0.40    Average packs/day: 0.4 packs/day for 40.0 years (16.0 ttl pk-yrs)    Types: Cigarettes    Passive exposure: Never   Smokeless tobacco: Never   Tobacco comments:    11/18/20 in process of quiting    03/31/23 3 cigarettes a day   Vaping Use   Vaping status: Never Used  Substance and Sexual Activity   Alcohol use: No    Alcohol/week: 0.0 standard drinks of alcohol   Drug use: Never   Sexual activity: Not on file  Other Topics Concern   Not on file  Social History Narrative   Lives with husband.        Social Determinants of Health   Financial Resource Strain: Not on file  Food Insecurity: No Food Insecurity (08/28/2022)   Hunger Vital Sign    Worried About Running Out of Food in the Last Year: Never true    Ran Out of Food in the Last Year: Never true  Transportation Needs: No Transportation Needs (08/28/2022)   PRAPARE - Administrator, Civil Service (Medical): No    Lack of Transportation (Non-Medical): No  Physical Activity: Not on file  Stress: Not on file  Social Connections: Not on file  Intimate Partner Violence: Not At Risk (08/28/2022)   Humiliation, Afraid, Rape, and Kick questionnaire    Fear of Current or Ex-Partner: No    Emotionally Abused: No    Physically Abused: No    Sexually Abused: No    Family History  Problem Relation Age of Onset   CAD Mother 46       Died of MI   Hypertension Mother    Heart attack Mother    Heart disease Mother    CAD Father 39       Died of MI   Heart disease Father    Heart  attack Father    CAD Brother 66       Two brothers died of MI   Heart attack Brother    Heart disease Brother        Amputation   Diabetes Sister    Hypertension Sister    Heart attack Brother    Heart disease Brother    Stroke Sister    Colon cancer Neg Hx    Stomach cancer Neg Hx  Esophageal cancer Neg Hx     Current Outpatient Medications  Medication Sig Dispense Refill   acetaminophen (TYLENOL) 500 MG tablet Take 1,000 mg by mouth every 6 (six) hours as needed for mild pain.     amLODipine (NORVASC) 10 MG tablet Take 1 tablet (10 mg total) by mouth daily. for blood pressure 90 tablet 3   aspirin 325 MG EC tablet Take 325 mg by mouth at bedtime.     Blood Glucose Monitoring Suppl (ONETOUCH VERIO FLEX SYSTEM) w/Device KIT USE AS ADVISED 1 kit 0   busPIRone (BUSPAR) 5 MG tablet TAKE 1 TABLET BY MOUTH THREE TIMES A DAY (Patient taking differently: Take 5 mg by mouth 2 (two) times daily.) 270 tablet 1   carvedilol (COREG) 3.125 MG tablet Take 1 tablet (3.125 mg total) by mouth 2 (two) times daily with a meal. 180 tablet 3   dapagliflozin propanediol (FARXIGA) 10 MG TABS tablet Take 1 tablet (10 mg total) by mouth daily. 90 tablet 3   furosemide (LASIX) 40 MG tablet Take by mouth.     glucose blood test strip Use as instructed 3x a day - One Touch Verio 300 each 3   hydrALAZINE (APRESOLINE) 25 MG tablet Take 1 tablet (25 mg total) by mouth 3 (three) times daily. 270 tablet 3   insulin aspart (NOVOLOG FLEXPEN) 100 UNIT/ML FlexPen Inject 10-14 Units into the skin 3 (three) times daily before meals. (Patient taking differently: Inject 14 Units into the skin 2 (two) times daily before a meal.) 45 mL 3   insulin degludec (TRESIBA FLEXTOUCH) 200 UNIT/ML FlexTouch Pen Inject 40 Units into the skin daily. 18 mL 3   meclizine (ANTIVERT) 25 MG tablet 1/2 tab up to 3 times daily for motion sickness/dizziness. (Patient taking differently: Take 12.5 mg by mouth 3 (three) times daily as needed for  dizziness. 1/2 tab up to 3 times daily for motion sickness/dizziness.) 30 tablet 0   Multiple Vitamins-Minerals (MULTIVITAMIN WITH MINERALS) tablet Take 1 tablet by mouth daily.     olmesartan (BENICAR) 40 MG tablet Take 1 tablet (40 mg total) by mouth daily. for blood pressure 90 tablet 3   Omega-3 Fatty Acids (FISH OIL) 1000 MG CAPS Take 1,000 mg by mouth in the morning and at bedtime.     OneTouch Delica Lancets 33G MISC Use 3x a day 200 each 3   pantoprazole (PROTONIX) 40 MG tablet Take 1 tablet (40 mg total) by mouth daily. 90 tablet 1   Polyethyl Glycol-Propyl Glycol (SYSTANE ULTRA OP) Place 1 drop into both eyes daily at 12 noon. Additional drop if needed for dry eyes     citalopram (CELEXA) 40 MG tablet TAKE 1 TABLET BY MOUTH EVERY DAY IN THE MORNING (Patient taking differently: Take 40 mg by mouth daily.) 90 tablet 2   Misc Natural Products (BRAINSTRONG MEMORY SUPPORT PO) Take 1 capsule by mouth daily.     rOPINIRole (REQUIP) 0.25 MG tablet TAKE 1 TABLET BY MOUTH EVERYDAY AT BEDTIME 90 tablet 2   rosuvastatin (CRESTOR) 40 MG tablet TAKE 1 TABLET BY MOUTH EVERY DAY 90 tablet 1   No current facility-administered medications for this visit.    Allergies  Allergen Reactions   Ciprofloxacin Swelling   Ozempic (0.25 Or 0.5 Mg-Dose) [Semaglutide(0.25 Or 0.5mg -Dos)] Nausea And Vomiting   Plavix [Clopidogrel Bisulfate] Palpitations   Amoxicillin Itching and Swelling    FACE & EYES SWELL   Ace Inhibitors Cough    Sore throat   Atorvastatin  myalgias   Lyrica [Pregabalin] Itching and Nausea Only     gain weight   Penicillins Other (See Comments)    Pt unsure if there is an allergic reaction      REVIEW OF SYSTEMS:   [X]  denotes positive finding, [ ]  denotes negative finding Cardiac  Comments:  Chest pain or chest pressure:    Shortness of breath upon exertion:    Short of breath when lying flat:    Irregular heart rhythm:        Vascular    Pain in calf, thigh, or hip  brought on by ambulation:    Pain in feet at night that wakes you up from your sleep:     Blood clot in your veins:    Leg swelling:         Pulmonary    Oxygen at home:    Productive cough:     Wheezing:         Neurologic    Sudden weakness in arms or legs:     Sudden numbness in arms or legs:     Sudden onset of difficulty speaking or slurred speech:    Temporary loss of vision in one eye:     Problems with dizziness:         Gastrointestinal    Blood in stool:     Vomited blood:         Genitourinary    Burning when urinating:     Blood in urine:        Psychiatric    Major depression:         Hematologic    Bleeding problems:    Problems with blood clotting too easily:        Skin    Rashes or ulcers:        Constitutional    Fever or chills:      PHYSICAL EXAMINATION:  Vitals:   04/18/23 1358  BP: (!) 172/83  Pulse: 65  Resp: 18  Temp: 98.3 F (36.8 C)  TempSrc: Temporal  SpO2: 94%  Weight: 194 lb (88 kg)  Height: 5\' 6"  (1.676 m)    General:  WDWN in NAD; vital signs documented above Gait: Not observed HENT: WNL, normocephalic Pulmonary: normal non-labored breathing , without Rales, rhonchi,  wheezing Cardiac: regular HR Abdomen: soft, NT, no masses Skin: without rashes Vascular Exam/Pulses: Brisk left DP and PT by Doppler Extremities: without ischemic changes, without Gangrene , without cellulitis; without open wounds;  Musculoskeletal: no muscle wasting or atrophy  Neurologic: A&O X 3 Psychiatric:  The pt has Normal affect.   Non-Invasive Vascular Imaging:    ABI/TBIToday's ABIToday's TBIPrevious ABIPrevious TBI  +-------+-----------+-----------+------------+------------+  Right 0.47       0.26       0.48        0.27          +-------+-----------+-----------+------------+------------+  Left  0.82       0.4        0.78        0.48         Elevated inflow velocity to the left leg bypass Biphasic waveforms throughout  the bypass with 270 cm/s at the distal graft and distal anastomosis  ASSESSMENT/PLAN:: 77 y.o. female status post balloon angioplasty of the recurrent stenosis in the distal portion of the left femoral to popliteal bypass graft  -Ms. Niang is a 77 year old female with extensive vascular surgical history related to PAD.  She  underwent balloon angioplasty of recurrent distal bypass stenosis.  Duplex demonstrates improvement in velocities in this area.  Duplex also suggests high-grade stenosis at the proximal anastomosis and inflow to the bypass however these areas were patent on angiogram.  She also has biphasic waveforms throughout the bypass which has improved since last duplex.  We will repeat left lower extremity arterial duplex and ABI in 3 months.  She will continue her aspirin and statin daily.  She will also continue her smoking cessation efforts.  She knows to call/return office sooner with any questions or concerns.   Emilie Rutter, PA-C [facility] [phone]  Clinic MD:   Lenell Antu

## 2023-04-22 ENCOUNTER — Other Ambulatory Visit: Payer: Self-pay

## 2023-04-22 DIAGNOSIS — I739 Peripheral vascular disease, unspecified: Secondary | ICD-10-CM

## 2023-04-25 ENCOUNTER — Ambulatory Visit (INDEPENDENT_AMBULATORY_CARE_PROVIDER_SITE_OTHER): Payer: Medicare Other | Admitting: Nurse Practitioner

## 2023-04-25 ENCOUNTER — Encounter: Payer: Self-pay | Admitting: Nurse Practitioner

## 2023-04-25 VITALS — BP 136/66 | HR 64 | Temp 97.6°F | Ht 66.0 in | Wt 193.4 lb

## 2023-04-25 DIAGNOSIS — E785 Hyperlipidemia, unspecified: Secondary | ICD-10-CM

## 2023-04-25 DIAGNOSIS — R0602 Shortness of breath: Secondary | ICD-10-CM

## 2023-04-25 DIAGNOSIS — E1351 Other specified diabetes mellitus with diabetic peripheral angiopathy without gangrene: Secondary | ICD-10-CM

## 2023-04-25 DIAGNOSIS — N184 Chronic kidney disease, stage 4 (severe): Secondary | ICD-10-CM | POA: Diagnosis not present

## 2023-04-25 DIAGNOSIS — E1122 Type 2 diabetes mellitus with diabetic chronic kidney disease: Secondary | ICD-10-CM

## 2023-04-25 DIAGNOSIS — Z794 Long term (current) use of insulin: Secondary | ICD-10-CM

## 2023-04-25 DIAGNOSIS — I10 Essential (primary) hypertension: Secondary | ICD-10-CM

## 2023-04-25 DIAGNOSIS — F17200 Nicotine dependence, unspecified, uncomplicated: Secondary | ICD-10-CM

## 2023-04-25 DIAGNOSIS — E1169 Type 2 diabetes mellitus with other specified complication: Secondary | ICD-10-CM | POA: Diagnosis not present

## 2023-04-25 DIAGNOSIS — E1159 Type 2 diabetes mellitus with other circulatory complications: Secondary | ICD-10-CM | POA: Diagnosis not present

## 2023-04-25 DIAGNOSIS — Z79899 Other long term (current) drug therapy: Secondary | ICD-10-CM | POA: Diagnosis not present

## 2023-04-25 DIAGNOSIS — R002 Palpitations: Secondary | ICD-10-CM | POA: Diagnosis not present

## 2023-04-25 NOTE — Patient Instructions (Signed)
Shortness of Breath, Adult Shortness of breath means you have trouble breathing. Shortness of breath could be a sign of a medical problem. Follow these instructions at home:  Pollution Do not smoke or use any products that contain nicotine or tobacco. If you need help quitting, ask your doctor. Avoid things that can make it harder to breathe, such as: Smoke of all kinds. This includes smoke from campfires or forest fires. Do not smoke or allow others to smoke in your home. Mold. Dust. Air pollution. Chemical smells. Things that can give you an allergic reaction (allergens) if you have allergies. Keep your living space clean. Use products that help remove mold and dust. General instructions Watch for any changes in your symptoms. Take over-the-counter and prescription medicines only as told by your doctor. This includes oxygen therapy and inhaled medicines. Rest as needed. Return to your normal activities when your doctor says that it is safe. Keep all follow-up visits. Contact a doctor if: Your condition does not get better as soon as expected. You have a hard time doing your normal activities, even after you rest. You have new symptoms. You cannot walk up stairs. You cannot exercise the way you normally do. Get help right away if: Your shortness of breath gets worse. You have trouble breathing when you are resting. You feel light-headed or you faint. You have a cough that is not helped by medicines. You cough up blood. You have pain with breathing. You have pain in your chest, arms, shoulders, or belly (abdomen). You have a fever. These symptoms may be an emergency. Get help right away. Call 911. Do not wait to see if the symptoms will go away. Do not drive yourself to the hospital. Summary Shortness of breath is when you have trouble breathing enough air. It can be a sign of a medical problem. Avoid things that make it hard for you to breathe, such as smoking, pollution,  mold, and dust. Watch for any changes in your symptoms. Contact your doctor if you do not get better or you get worse. This information is not intended to replace advice given to you by your health care provider. Make sure you discuss any questions you have with your health care provider. Document Revised: 04/25/2021 Document Reviewed: 04/25/2021 Elsevier Patient Education  2024 [employer]  

## 2023-04-25 NOTE — Progress Notes (Signed)
Assessment and Plan:  [name] was seen today for an episodic visit.  Diagnoses and all order for this visit:  Essential hypertension Cardiology and Vascular following Continue medication as directed. Discussed DASH (Dietary Approaches to Stop Hypertension) DASH diet is lower in sodium than a typical American diet. Cut back on foods that are high in saturated fat, cholesterol, and trans fats. Eat more whole-grain foods, fish, poultry, and nuts Remain active and exercise as tolerated daily.  Monitor BP at home-Call if greater than 130/80.  Check CMP/CBC  Hyperlipidemia associated with type 2 diabetes mellitus (HCC) Vascular following Discussed lifestyle modifications. Recommended diet heavy in fruits and veggies, omega 3's. Decrease consumption of animal meats, cheeses, and dairy products. Remain active and exercise as tolerated. Continue to monitor. Check lipids/TSH  Peripheral vascular [name] due to secondary diabetes mellitus (HCC) Vascular following Continue medicaiton as directed Keep BP and lipids well controlled.   Type 2 diabetes mellitus with other circulatory complication, with long-term current use of insulin [facility]) Education: Reviewed 'ABCs' of diabetes management  Discussed goals to be met and/or maintained include A1C (<7) Blood pressure (<130/80) Cholesterol (LDL <70) Continue Eye Exam yearly  Continue Dental Exam Q6 mo Discussed dietary recommendations Discussed Physical Activity recommendations  CKD stage 4 due to type 2 diabetes mellitus [facility]) [name] Dr. Thedore Mins following Continue medications as directed Discussed how what you eat and drink can aide in kidney protection. Stay well hydrated. Avoid high salt foods. Avoid NSAIDS. Keep BP and BG well controlled.   Take medications as prescribed. Remain active and exercise as tolerated daily. Maintain weight.  Continue to monitor.  Medication management All medications discussed and reviewed  in full. All questions and concerns regarding medications addressed.    SOB (shortness of breath)/smoker Smoking cessation instruction/counseling given:  counseled patient on the dangers of tobacco use, advised patient to stop smoking, and reviewed strategies to maximize success  - Ambulatory referral to Pulmonology  Notify office for further evaluation and treatment, questions or concerns if s/s fail to improve. The risks and benefits of my recommendations, as well as other treatment options were discussed with the patient today. Questions were answered.  Further disposition pending results of labs. Discussed med's effects and SE's.    Over 20 minutes of exam, counseling, chart review, and critical decision making was performed.   Future Appointments  Date Time Provider Department Center  06/01/2023  3:10 PM Joylene Grapes, NP [facility] None  06/13/2023  9:20 AM Carlus Pavlov, MD [facility] None  07/25/2023 11:30 AM [facility] 5 [facility]  07/25/2023 12:00 PM [facility]  07/25/2023 12:45 PM [facility]-2 [facility]  08/08/2023  2:00 PM [name], NP [facility] None    ------------------------------------------------------------------------------------------------------------------   HPI BP 136/66   Pulse 64   Temp 97.6 F (36.4 C)   Ht 5\' 6"  (1.676 m)   Wt 193 lb 6.4 oz (87.7 kg)   SpO2 98%   BMI 31.22 kg/m   77 y.o.female presents for referral to Pulmonology due to increase in shortness of breath and feeling short winded without activity.  [name].  [name].  [name], Pulmonology, in the past.  Karen is a current every day smoker.    Of note, [name] for htn management.  [name].  Karen had multiple complaints during that visit At that  time Karen had multiple complaints, including tremors, involuntary movements, headaches, blurred vision, itching, swelling in Karen hands, face and legs, and leg cramps. Karen believed much of it to be side effects of medications, and stopped Karen buspirone, citalopram and rosuvastatin on Karen own. Karen has been on multiple BP lowering medications, but there is uncertainty as to why many of them were discontinued. (Olmesartan, bisoprolol-hctz, chlorthalidone and hydralazine). Because of Karen poor renal function Karen was started on carvedilol 3.125 mg bid and asked to restart the hydralazine 25 mg tid. BP was controlled during that visit with cardiology 130/80.  [name], 04/18/23 Vascular for tmt of atherosclerosis for follow up  status post drug-coated balloon angioplasty of the distal left femoral to popliteal bypass graft due to recurrent stenosis and a threatened bypass graft. This was performed by Dr. Myra Gianotti on 03/15/2023. The left ankle wound has healed and has not returned. Karen does have edema on the left leg however symptoms are tolerable. Karen has recently been started on Lasix which has seemed to help with some of Karen edema symptoms. Karen denies any rest pain or tissue loss of bilateral lower extremities. Karen is on a statin and aspirin daily. Karen continues to smoke about 4 to 5 cigarettes a day. Karen states Karen husband has recently quit.   Today Karen is concerned for increase in SOB.  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,  "small"   Anxiety    Arthritis    Cataract    CKD (chronic kidney [name]), stage IV (HCC)    followed by [facility]   Colon polyps    Diabetes mellitus 1998   Diverticulosis    Emphysema of lung (HCC)    "no one has evry told me that"   ESOPHAGEAL STRICTURE 08/27/2008   Family history of adverse reaction to anesthesia    daughter n/v   GERD  (gastroesophageal reflux [name])    Hemorrhoids    Hiatal hernia    Hyperlipidemia    Hypertension 2000   Migraines    Neuropathy    fingers and toe   Peripheral vascular [name] (HCC)    Phlebitis    30 years ago  left leg   Stroke [facility])    multiple mini strokes ( brain aneurysm ).  Right side weaker.     Allergies  Allergen Reactions   Ciprofloxacin Swelling   Ozempic (0.25 Or 0.5 Mg-Dose) [Semaglutide(0.25 Or 0.5mg -Dos)] Nausea And Vomiting   Plavix [Clopidogrel Bisulfate] Palpitations   Amoxicillin Itching and Swelling    FACE & EYES SWELL   Ace Inhibitors Cough    Sore throat   Atorvastatin     myalgias   Lyrica [Pregabalin] Itching and Nausea Only     gain weight   Penicillins Other (See Comments)    Pt unsure if there is an allergic reaction     Current Outpatient Medications on File Prior to Visit  Medication Sig   acetaminophen (TYLENOL) 500 MG tablet Take 1,000 mg by mouth every 6 (six) hours as needed for mild pain.   amLODipine (NORVASC) 10 MG tablet Take 1 tablet (10 mg total) by mouth daily. for blood pressure   aspirin 325 MG EC tablet Take 325 mg by mouth at bedtime.  Blood Glucose Monitoring Suppl (ONETOUCH VERIO FLEX SYSTEM) w/Device KIT USE AS ADVISED   busPIRone (BUSPAR) 5 MG tablet TAKE 1 TABLET BY MOUTH THREE TIMES A DAY (Patient taking differently: Take 5 mg by mouth 2 (two) times daily.)   carvedilol (COREG) 3.125 MG tablet Take 1 tablet (3.125 mg total) by mouth 2 (two) times daily with a meal.   dapagliflozin propanediol (FARXIGA) 10 MG TABS tablet Take 1 tablet (10 mg total) by mouth daily.   furosemide (LASIX) 40 MG tablet Take by mouth.   glucose blood test strip Use as instructed 3x a day - One Touch Verio   hydrALAZINE (APRESOLINE) 25 MG tablet Take 1 tablet (25 mg total) by mouth 3 (three) times daily.   insulin aspart (NOVOLOG FLEXPEN) 100 UNIT/ML FlexPen Inject 10-14 Units into the skin 3 (three) times daily before meals. (Patient taking  differently: Inject 14 Units into the skin 2 (two) times daily before a meal.)   insulin degludec (TRESIBA FLEXTOUCH) 200 UNIT/ML FlexTouch Pen Inject 40 Units into the skin daily.   meclizine (ANTIVERT) 25 MG tablet 1/2 tab up to 3 times daily for motion sickness/dizziness. (Patient taking differently: Take 12.5 mg by mouth 3 (three) times daily as needed for dizziness. 1/2 tab up to 3 times daily for motion sickness/dizziness.)   Multiple Vitamins-Minerals (MULTIVITAMIN WITH MINERALS) tablet Take 1 tablet by mouth daily.   olmesartan (BENICAR) 40 MG tablet Take 1 tablet (40 mg total) by mouth daily. for blood pressure   Omega-3 Fatty Acids (FISH OIL) 1000 MG CAPS Take 1,000 mg by mouth in the morning and at bedtime.   OneTouch Delica Lancets 33G MISC Use 3x a day   Polyethyl Glycol-Propyl Glycol (SYSTANE ULTRA OP) Place 1 drop into both eyes daily at 12 noon. Additional drop if needed for dry eyes   citalopram (CELEXA) 40 MG tablet TAKE 1 TABLET BY MOUTH EVERY DAY IN THE MORNING (Patient taking differently: Take 40 mg by mouth daily.)   Misc Natural Products (BRAINSTRONG MEMORY SUPPORT PO) Take 1 capsule by mouth daily.   pantoprazole (PROTONIX) 40 MG tablet Take 1 tablet (40 mg total) by mouth daily. (Patient not taking: Reported on 04/25/2023)   rOPINIRole (REQUIP) 0.25 MG tablet TAKE 1 TABLET BY MOUTH EVERYDAY AT BEDTIME   rosuvastatin (CRESTOR) 40 MG tablet TAKE 1 TABLET BY MOUTH EVERY DAY   No current facility-administered medications on file prior to visit.    ROS: all negative except what is noted in the HPI.   Physical Exam:  BP 136/66   Pulse 64   Temp 97.6 F (36.4 C)   Ht 5\' 6"  (1.676 m)   Wt 193 lb 6.4 oz (87.7 kg)   SpO2 98%   BMI 31.22 kg/m   General Appearance: NAD.  Awake, conversant and cooperative. Eyes: PERRLA, EOMs intact.  Sclera white.  Conjunctiva without erythema. Sinuses: No frontal/maxillary tenderness.  No nasal discharge. Nares patent.  ENT/Mouth: Ext aud  canals clear.  Bilateral TMs w/DOL and without erythema or bulging. Hearing intact.  Posterior pharynx without swelling or exudate.  Tonsils without swelling or erythema.  Neck: Supple.  No masses, nodules or thyromegaly. Respiratory: Effort is regular with non-labored breathing. Breath sounds are equal bilaterally without rales, rhonchi, wheezing or stridor.  Cardio: RRR with no MRGs. Brisk peripheral pulses without edema.  Abdomen: Active BS in all four quadrants.  Soft and non-tender without guarding, rebound tenderness, hernias or masses. Lymphatics: Non tender without lymphadenopathy.  Musculoskeletal: Full  ROM, 5/5 strength, normal ambulation.  No clubbing or cyanosis. Skin: Appropriate color for ethnicity. Warm without rashes, lesions, ecchymosis, ulcers.  Neuro: CN II-XII grossly normal. Normal muscle tone without cerebellar symptoms and intact sensation.   Psych: AO X 3,  appropriate mood and affect, insight and judgment.     Adela Glimpse, NP 11:26 AM [facility]

## 2023-04-29 ENCOUNTER — Other Ambulatory Visit: Payer: Self-pay | Admitting: Nurse Practitioner

## 2023-04-29 DIAGNOSIS — I10 Essential (primary) hypertension: Secondary | ICD-10-CM

## 2023-05-05 DIAGNOSIS — N2581 Secondary hyperparathyroidism of renal origin: Secondary | ICD-10-CM | POA: Diagnosis not present

## 2023-05-05 DIAGNOSIS — N184 Chronic kidney disease, stage 4 (severe): Secondary | ICD-10-CM | POA: Diagnosis not present

## 2023-05-05 DIAGNOSIS — R609 Edema, unspecified: Secondary | ICD-10-CM | POA: Diagnosis not present

## 2023-05-05 DIAGNOSIS — D631 Anemia in chronic kidney disease: Secondary | ICD-10-CM | POA: Diagnosis not present

## 2023-05-05 DIAGNOSIS — I129 Hypertensive chronic kidney disease with stage 1 through stage 4 chronic kidney disease, or unspecified chronic kidney disease: Secondary | ICD-10-CM | POA: Diagnosis not present

## 2023-05-10 ENCOUNTER — Other Ambulatory Visit: Payer: Self-pay | Admitting: Nurse Practitioner

## 2023-05-30 ENCOUNTER — Other Ambulatory Visit: Payer: Self-pay | Admitting: Nurse Practitioner

## 2023-05-30 DIAGNOSIS — F419 Anxiety disorder, unspecified: Secondary | ICD-10-CM

## 2023-06-01 ENCOUNTER — Ambulatory Visit: Payer: Medicare Other | Admitting: Nurse Practitioner

## 2023-06-13 ENCOUNTER — Ambulatory Visit: Payer: Medicare Other | Admitting: Internal Medicine

## 2023-07-04 ENCOUNTER — Encounter: Payer: Self-pay | Admitting: Internal Medicine

## 2023-07-04 ENCOUNTER — Ambulatory Visit (INDEPENDENT_AMBULATORY_CARE_PROVIDER_SITE_OTHER): Payer: Medicare Other | Admitting: Internal Medicine

## 2023-07-04 DIAGNOSIS — E785 Hyperlipidemia, unspecified: Secondary | ICD-10-CM | POA: Diagnosis not present

## 2023-07-04 DIAGNOSIS — E66811 Obesity, class 1: Secondary | ICD-10-CM

## 2023-07-04 DIAGNOSIS — E1351 Other specified diabetes mellitus with diabetic peripheral angiopathy without gangrene: Secondary | ICD-10-CM

## 2023-07-04 DIAGNOSIS — Z6830 Body mass index (BMI) 30.0-30.9, adult: Secondary | ICD-10-CM | POA: Diagnosis not present

## 2023-07-04 LAB — POCT GLYCOSYLATED HEMOGLOBIN (HGB A1C): Hemoglobin A1C: 8.4 % — AB (ref 4.0–5.6)

## 2023-07-04 LAB — GLUCOSE, POCT (MANUAL RESULT ENTRY): POC Glucose: 388 mg/dL — AB (ref 70–99)

## 2023-07-04 NOTE — Patient Instructions (Signed)
Please  continue: - Farxiga 10 mg in am - Tresiba 40 units daily - Novolog: 10-14 units 2x day before meals 4-5 units for correction of a blood sugars >200    Start checking sugars more frequently later in the day.    Please return in 4 months with your sugar log.

## 2023-07-04 NOTE — Progress Notes (Signed)
Patient ID: [name], female   DOB: 02/17/1946, 77 y.o.   MRN: [id number]   HPI: [name] is a 77 y.o.-year-old female, initially referred by her cardiologist, Dr.Hochrein, presenting for follow-up for DM2, dx in ~2000, insulin-dependent since ~2005, uncontrolled, with complications (PVD - s/p stents and fem-pop bypass; h/o CVA x3; CKD stage 3b; DR; PN). She saw my colleague, Dr. Everardo All many years ago.  Last visit with me 5 months ago. PCP: Quentin Mulling, PA  Interim history: No increased urination, nausea, chest pain.  She started to have sinus pressure 2 days ago, and continues to have SOB especially with exertion for the last 3 months.  She will see pulmonology the day after tomorrow. She is overwhelmed by the situation at home (husband with dementia, daughter sick, best friend in hospice) and also with her own medical care (this week she has appointments almost every day).  She has erratic meals and mostly forgets to eat during the day and eats only dinner.  She also does not take NovoLog consistently.  Reviewed HbA1c levels: 02/07/2023: HbA1c calculated from fructosamine is 6.13%, which is excellent. Lab Results  Component Value Date   HGBA1C 7.7 (A) 02/07/2023   HGBA1C 7.6 (H) 08/04/2022   HGBA1C 7.6 (A) 05/31/2022   HGBA1C 7.6 (A) 01/22/2022   HGBA1C 7.1 (H) 11/17/2021   HGBA1C 8.5 (H) 08/04/2021   HGBA1C 8.7 (A) 06/08/2021   HGBA1C 8.4 (H) 12/15/2020   HGBA1C 8.7 (A) 09/22/2020   HGBA1C 9.6 (A) 05/21/2020  05/31/2022: HbA1c calc. From fructosamine 6.5% 01/26/2021: HbA1c calculated from fructosamine is 7.47%, higher. 09/22/2020: HbA1c calculated from fructosamine is much better than the directly measured HbA1c, at 6.25% 05/21/2020: HbA1c calculated from fructosamine 7.3%, much better than the directly measured one, and stable from before 01/14/2020: HbA1c calculated from fructosamine 7.3% 10/08/2019: HbA1c calculated from fructosamine: 6.9%  She was previously on: -  Novolog 70/30 35 units before breakfast and 30 units before dinner- injecting hips and arms due to abdominal pain - Farxiga 5 mg in am - started 06/2019 >> 10 mg daily - increased 09/2019  Currently on: - Farxiga 10 mg daily before b'fast - held at our visit from 09/2022 - Tresiba 40 units daily - Novolog: 10-14 units 2x day before meals >> using 14 units for all meals - forgets many doses and skips many meals   She could not tolerate Ozempic (started 11/2018) due to nausea, vomiting, abdominal pain-stopped 04/2019. She was on Invokana >> intolerance 2/2 weight loss and dehydration >> hospitalization. She was on Metformin and sulfonylurea at diagnosis. She was also on Januvia at the same time with Invokana >> stopped along with Invokana.  Pt was checking sugar 1x a day (declines a CGM) - am: 68, 72-121, 144 >> 82-102 >> 79-105 >> 80-100 - 2h after b'fast: 326 >> n/c >> 326 (10/2019) >> n/c - before lunch: 124-172 >> n/c >> 80s-100s >> n/c - 2h after lunch: 280, 286 >> n/c >> 250 >> n/c - before dinner: n/c >> 180 >> <140 >> 76 >> n/c - 2h after dinner: 247 >> 196- 201 >> 172 >> n/c  - bedtime: n/c >> 178 >> n/c - nighttime: 68  - seldom >> 57 (took Novolog and skipped dinner!!!), 129 >> n/c Lowest sugar was 57 >> 82 >> 79 >> 80; she has hypoglycemia awareness at 80. Highest sugar was 335 (no medications) >> .Marland KitchenMarland Kitchen 100s >> 119 >> 100s.  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   -+ CKD stage IIIb (Dr. Thedore Mins), last BUN/creatinine:  Lab Results  Component Value Date   BUN 32 (H) 03/31/2023   BUN 52 (H) 03/15/2023   CREATININE 2.15 (H) 03/31/2023   CREATININE 2.20 (H) 03/15/2023  09/06/2022, GFR 20 Lab Results  Component  Value Date   GFRAA 32 (L) 12/15/2020   GFRAA 25 (L) 08/04/2020   GFRAA 23 (L) 07/31/2020   GFRAA 22 (L) 07/30/2020   GFRAA 31 (L) 10/18/2019  On olmesartan 40.  -+ HL; last set of lipids: Lab Results  Component Value Date   CHOL 152 08/04/2022   HDL 54 08/04/2022   LDLCALC 74 08/04/2022   LDLDIRECT 146.4 01/30/2010   TRIG 154 (H) 08/04/2022   CHOLHDL 2.8 08/04/2022  On Crestor 40 mg daily, omega-3 fatty acids 1200 mg daily.  - last eye exam was: 01/05/2023: + DR; prev. + DR. She also has glaucoma. Dr. [place] Frasier at [facility] care.  -+ Numbness and tingling in her feet.  She has a callus on the left foot.  She sees a podiatrist.  She did not tolerate Lyrica.  Last foot exam 05/31/2022, she had an infected ischemic ulcer due to arterial insufficiency after injury to her left foot in 09/2021.  She had left femoropopliteal bypass graft 11/18/2021.  Afterwards, she had a balloon angioplasty >> L blood flow improved >> she got back feeling in her toes.  On ASA 81.  Pt has FH of DM in sister.  She also has a history of nontoxic multinodular goiter.  Latest TSH was reviewed and this was normal: Lab Results  Component Value Date   TSH 1.780 03/31/2023   She also has a history of HTN, brain aneurysm, emphysema, esophageal stricture, GERD.  She had hernia repair surgery- had mesh placed >> persistent abdominal pain. She started to take a supplement and vitamins in 09/2021 (by [facility] [place]) - sugars started to improve afterwards. She was admitted 12/8-07/2022 With chest pain, dizziness, shortness of breath-hypertensive urgency (sBP 200) - considered due to stress and anxiety.    Her husband's nephew, Fransisca Connors, was also my patient and he died at the beginning of 08-12-20 after he stopped going to dialysis.  ROS: + see HPI  I reviewed pt's medications, allergies, PMH, social hx, family hx, and changes were documented in the history of present illness. Otherwise,  unchanged from my initial visit note.  Past Medical History:  Diagnosis Date   Abnormal findings on esophagogastroduodenoscopy (EGD) 12-Aug-2010   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,  "small"   Anxiety    Arthritis    Cataract    CKD (chronic kidney disease), stage IV (HCC)    followed by [facility]   Colon polyps    Diabetes mellitus 08-12-97   Diverticulosis    Emphysema of lung (HCC)    "no one has evry told me that"   ESOPHAGEAL STRICTURE 08/27/2008   Family history of adverse reaction to anesthesia    daughter n/v   GERD (gastroesophageal reflux disease)    Hemorrhoids    Hiatal hernia    Hyperlipidemia    Hypertension 2000   Migraines  Neuropathy    fingers and toe   Peripheral vascular disease (HCC)    Phlebitis    30 years ago  left leg   Stroke [facility])    multiple mini strokes ( brain aneurysm ).  Right side weaker.   Past Surgical History:  Procedure Laterality Date   ABDOMINAL AORTAGRAM N/A 01/04/2012   Procedure: ABDOMINAL AORTAGRAM;  Surgeon: Nada Libman, MD;  Location: [facility] CATH LAB;  Service: Cardiovascular;  Laterality: N/A;   ABDOMINAL AORTAGRAM N/A 08/15/2012   Procedure: ABDOMINAL Ronny Flurry;  Surgeon: Nada Libman, MD;  Location: [facility] CATH LAB;  Service: Cardiovascular;  Laterality: N/A;   ABDOMINAL AORTOGRAM W/LOWER EXTREMITY Left 11/17/2021   Procedure: ABDOMINAL AORTOGRAM W/LOWER EXTREMITY;  Surgeon: Nada Libman, MD;  Location: [facility] INVASIVE CV LAB;  Service: Cardiovascular;  Laterality: Left;   ABDOMINAL AORTOGRAM W/LOWER EXTREMITY N/A 03/09/2022   Procedure: ABDOMINAL AORTOGRAM W/LOWER EXTREMITY;  Surgeon: Nada Libman, MD;  Location: [facility] INVASIVE CV LAB;  Service: Cardiovascular;  Laterality: N/A;   ABDOMINAL AORTOGRAM W/LOWER EXTREMITY N/A 03/15/2023   Procedure: ABDOMINAL AORTOGRAM W/LOWER EXTREMITY;  Surgeon: Nada Libman, MD;  Location: [facility] INVASIVE CV LAB;  Service: Cardiovascular;  Laterality: N/A;   ABDOMINAL HYSTERECTOMY  1984    cataract surgery Right 10-22-2015   cataract surgery Left 11-05-2015   CESAREAN SECTION     CHOLECYSTECTOMY     COLONOSCOPY  07/2010   DENTAL SURGERY  Aug. 16, 2013   left lower    ESOPHAGOGASTRODUODENOSCOPY  07/2010   EYE SURGERY  Nov. 2014   Laser-Glaucoma   FEMORAL ARTERY STENT  05/11/11   Left superficial femoral and popliteal artery   FEMORAL-POPLITEAL BYPASS GRAFT Left 11/18/2021   Procedure: LEFT FEMORAL-POPLITEAL ARTERY BYPASS GRAFT;  Surgeon: Nada Libman, MD;  Location: [facility] OR;  Service: Vascular;  Laterality: Left;   HERNIA REPAIR     times two   KNEE SURGERY     LOWER EXTREMITY ANGIOGRAM Left 08/15/2012   Procedure: LOWER EXTREMITY ANGIOGRAM;  Surgeon: Nada Libman, MD;  Location: [facility] CATH LAB;  Service: Cardiovascular;  Laterality: Left;  lt leg angio   PERIPHERAL VASCULAR BALLOON ANGIOPLASTY Left 03/09/2022   Procedure: PERIPHERAL VASCULAR BALLOON ANGIOPLASTY;  Surgeon: Nada Libman, MD;  Location: [facility] INVASIVE CV LAB;  Service: Cardiovascular;  Laterality: Left;   PERIPHERAL VASCULAR BALLOON ANGIOPLASTY Left 03/15/2023   Procedure: PERIPHERAL VASCULAR BALLOON ANGIOPLASTY;  Surgeon: Nada Libman, MD;  Location: [facility] INVASIVE CV LAB;  Service: Cardiovascular;  Laterality: Left;   rotator cuff surgery     THYROID SURGERY     Social History   Socioeconomic History   Marital status: Married    Spouse name: Not on file   Number of children: 2   Years of education: Not on file   Highest education level: Not on file  Occupational History   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  Tobacco Use   Smoking status: Light Smoker    Current packs/day: 0.40    Average packs/day: 0.4 packs/day for 40.0 years (16.0 ttl pk-yrs)    Types: Cigarettes    Passive exposure: Never   Smokeless tobacco: Never   Tobacco comments:    11/18/20 in process of quiting    03/31/23 3 cigarettes a day   Vaping Use   Vaping status: Never Used  Substance and Sexual Activity    Alcohol use: No    Alcohol/week: 0.0 standard drinks of alcohol   Drug use: Never  Sexual activity: Not on file  Other Topics Concern   Not on file  Social History Narrative   Lives with husband.        Social Determinants of Health   Financial Resource Strain: Not on file  Food Insecurity: No Food Insecurity (08/28/2022)   Hunger Vital Sign    Worried About Running Out of Food in the Last Year: Never true    Ran Out of Food in the Last Year: Never true  Transportation Needs: No Transportation Needs (08/28/2022)   PRAPARE - Administrator, Civil Service (Medical): No    Lack of Transportation (Non-Medical): No  Physical Activity: Not on file  Stress: Not on file  Social Connections: Not on file  Intimate Partner Violence: Not At Risk (08/28/2022)   Humiliation, Afraid, Rape, and Kick questionnaire    Fear of Current or Ex-Partner: No    Emotionally Abused: No    Physically Abused: No    Sexually Abused: No   Current Outpatient Medications on File Prior to Visit  Medication Sig Dispense Refill   acetaminophen (TYLENOL) 500 MG tablet Take 1,000 mg by mouth every 6 (six) hours as needed for mild pain.     amLODipine (NORVASC) 10 MG tablet TAKE 1 TABLET BY MOUTH EVERY DAY FOR BLOOD PRESSURE 90 tablet 3   aspirin 325 MG EC tablet Take 325 mg by mouth at bedtime.     Blood Glucose Monitoring Suppl (ONETOUCH VERIO FLEX SYSTEM) w/Device KIT USE AS ADVISED 1 kit 0   busPIRone (BUSPAR) 5 MG tablet TAKE 1 TABLET BY MOUTH THREE TIMES A DAY 270 tablet 1   carvedilol (COREG) 3.125 MG tablet Take 1 tablet (3.125 mg total) by mouth 2 (two) times daily with a meal. 180 tablet 3   dapagliflozin propanediol (FARXIGA) 10 MG TABS tablet Take 1 tablet (10 mg total) by mouth daily. 90 tablet 3   furosemide (LASIX) 40 MG tablet Take by mouth.     glucose blood test strip Use as instructed 3x a day - One Touch Verio 300 each 3   hydrALAZINE (APRESOLINE) 25 MG tablet Take 1 tablet (25 mg  total) by mouth 3 (three) times daily. 270 tablet 3   insulin aspart (NOVOLOG FLEXPEN) 100 UNIT/ML FlexPen Inject 10-14 Units into the skin 3 (three) times daily before meals. (Patient taking differently: Inject 14 Units into the skin 2 (two) times daily before a meal.) 45 mL 3   insulin degludec (TRESIBA FLEXTOUCH) 200 UNIT/ML FlexTouch Pen Inject 40 Units into the skin daily. 18 mL 3   meclizine (ANTIVERT) 25 MG tablet 1/2 tab up to 3 times daily for motion sickness/dizziness. (Patient taking differently: Take 12.5 mg by mouth 3 (three) times daily as needed for dizziness. 1/2 tab up to 3 times daily for motion sickness/dizziness.) 30 tablet 0   Multiple Vitamins-Minerals (MULTIVITAMIN WITH MINERALS) tablet Take 1 tablet by mouth daily.     olmesartan (BENICAR) 40 MG tablet Take 1 tablet (40 mg total) by mouth daily. for blood pressure 90 tablet 3   Omega-3 Fatty Acids (FISH OIL) 1000 MG CAPS Take 1,000 mg by mouth in the morning and at bedtime.     OneTouch Delica Lancets 33G MISC Use 3x a day 200 each 3   pantoprazole (PROTONIX) 40 MG tablet Take 1 tablet (40 mg total) by mouth daily. (Patient not taking: Reported on 04/25/2023) 90 tablet 1   Polyethyl Glycol-Propyl Glycol (SYSTANE ULTRA OP) Place 1 drop into both  eyes daily at 12 noon. Additional drop if needed for dry eyes     No current facility-administered medications on file prior to visit.   Allergies  Allergen Reactions   Ciprofloxacin Swelling   Ozempic (0.25 Or 0.5 Mg-Dose) [Semaglutide(0.25 Or 0.5mg -Dos)] Nausea And Vomiting   Plavix [Clopidogrel Bisulfate] Palpitations   Amoxicillin Itching and Swelling    FACE & EYES SWELL   Ace Inhibitors Cough    Sore throat   Atorvastatin     myalgias   Lyrica [Pregabalin] Itching and Nausea Only     gain weight   Penicillins Other (See Comments)    Pt unsure if there is an allergic reaction    Family History  Problem Relation Age of Onset   CAD Mother 78       Died of MI    Hypertension Mother    Heart attack Mother    Heart disease Mother    CAD Father 44       Died of MI   Heart disease Father    Heart attack Father    CAD Brother 26       Two brothers died of MI   Heart attack Brother    Heart disease Brother        Amputation   Diabetes Sister    Hypertension Sister    Heart attack Brother    Heart disease Brother    Stroke Sister    Colon cancer Neg Hx    Stomach cancer Neg Hx    Esophageal cancer Neg Hx    PE: BP (!) 142/58   Pulse 71   Ht 5\' 6"  (1.676 m)   Wt 191 lb 9.6 oz (86.9 kg)   SpO2 96%   BMI 30.93 kg/m  Wt Readings from Last 10 Encounters:  07/04/23 191 lb 9.6 oz (86.9 kg)  04/25/23 193 lb 6.4 oz (87.7 kg)  04/18/23 194 lb (88 kg)  03/31/23 190 lb 9.6 oz (86.5 kg)  03/15/23 189 lb (85.7 kg)  02/28/23 188 lb (85.3 kg)  02/07/23 190 lb (86.2 kg)  11/15/22 191 lb 3.2 oz (86.7 kg)  11/10/22 191 lb 9.6 oz (86.9 kg)  09/27/22 191 lb 12.8 oz (87 kg)   Constitutional: overweight, in NAD Eyes:  EOMI, no exophthalmos ENT: no neck masses, no cervical lymphadenopathy Cardiovascular: RRR, No MRG Respiratory: CTA Musculoskeletal: no deformities Skin:no rashes Neurological: no tremor with outstretched hands Diabetic Foot Exam - Simple   Simple Foot Form Diabetic Foot exam was performed with the following findings: Yes 07/04/2023 11:59 AM  Visual Inspection No deformities, no ulcerations, no other skin breakdown bilaterally: Yes Sensation Testing Intact to touch and monofilament testing bilaterally: Yes Pulse Check See comments: Yes Comments Pulse barely palpable in L foot. Mild swelling in L foot.    ASSESSMENT: 1. DM2, insulin-dependent, uncontrolled, with complications - PVD - s/p stents and fem-pop bypass - h/o CVA - CKD stage 3b - DR - PN  Her antipancreatic antibodies were negative: Component     Latest Ref Rng & Units 07/22/2016  Glutamic Acid Decarb Ab     <5 IU/mL <5   2.  Hyperlipidemia  3.  Obesity  class 1  PLAN:  1. Patient with longstanding, uncontrolled, type 2 diabetes, previously on premixed insulin regimen but now on basal/bolus insulin regimen and SGLT2 inhibitor.  We previously held [place] due to the low GFR (20), but now back on it.  At last visit sugars were at goal in  the morning, excellent, but she was not checking later in the day and I strongly advised her to start.  I also advised her to vary the dose of NovoLog based on the size of the meals.  HbA1c calculated from fructosamine at that time was excellent, at 6.13%. -At today's visit, she is only checking sugars in the morning and these are at goal.  She is not checking later in the day, not eating regular meals, and not taking NovoLog consistently, as she is overwhelmed with a lot of responsibilities at home and with her own medical care.  We discussed about taking more days off to just relax and also to set alarms on her phone for meals and for taking her insulin.  We also discussed to try to split dinner over the whole day, to avoid slowing down her metabolism.  Otherwise, I suggested, and she agrees, not to change the regimen. -I did suggest a CGM at today's visit but she declines - I suggested to:  Patient Instructions  Please  continue: - Farxiga 10 mg in am - Tresiba 40 units daily - Novolog: 10-14 units 2x day before meals 4-5 units for correction of a blood sugars >200    Start checking sugars more frequently later in the day.    Please return in 4 months with your sugar log.    - we checked her HbA1c: 8.4% (higher) - advised to check sugars at different times of the day - 3x a day, rotating check times - advised for yearly eye exams >> she is UTD - she sees nephrology-has an appointment tomorrow - return to clinic in 4 months  2. HL -Reviewed latest lipid panel from 07/2022: LDL above our target of less than 55, but approximately 50% of the initial LDL: Lab Results  Component Value Date   CHOL 152  08/04/2022   HDL 54 08/04/2022   LDLCALC 74 08/04/2022   LDLDIRECT 146.4 01/30/2010   TRIG 154 (H) 08/04/2022   CHOLHDL 2.8 08/04/2022  -She continues on Crestor 40 mg daily omega-3 fatty acids 1200 mg daily, without side effects  3.  Obesity class 1 -Unfortunately, we could not continue a GLP-1 receptor agonist due to GI symptoms; we again addressed this today and she declines retrying this -We previously held [place] due to low GFR but currently back on it.  This should also help with weight loss. -Weight was approximately stable at last visit and she gained 1 pound since then  Carlus Pavlov, MD PhD [facility] Endocrinology

## 2023-07-05 ENCOUNTER — Ambulatory Visit: Payer: Medicare Other | Admitting: Pulmonary Disease

## 2023-07-05 ENCOUNTER — Encounter: Payer: Self-pay | Admitting: Pulmonary Disease

## 2023-07-05 VITALS — BP 142/74 | HR 71 | Temp 98.5°F | Ht 66.0 in | Wt 192.8 lb

## 2023-07-05 DIAGNOSIS — D631 Anemia in chronic kidney disease: Secondary | ICD-10-CM | POA: Diagnosis not present

## 2023-07-05 DIAGNOSIS — N2581 Secondary hyperparathyroidism of renal origin: Secondary | ICD-10-CM | POA: Diagnosis not present

## 2023-07-05 DIAGNOSIS — I129 Hypertensive chronic kidney disease with stage 1 through stage 4 chronic kidney disease, or unspecified chronic kidney disease: Secondary | ICD-10-CM | POA: Diagnosis not present

## 2023-07-05 DIAGNOSIS — R911 Solitary pulmonary nodule: Secondary | ICD-10-CM | POA: Diagnosis not present

## 2023-07-05 DIAGNOSIS — N184 Chronic kidney disease, stage 4 (severe): Secondary | ICD-10-CM | POA: Diagnosis not present

## 2023-07-05 DIAGNOSIS — J439 Emphysema, unspecified: Secondary | ICD-10-CM

## 2023-07-05 DIAGNOSIS — J449 Chronic obstructive pulmonary disease, unspecified: Secondary | ICD-10-CM

## 2023-07-05 DIAGNOSIS — J309 Allergic rhinitis, unspecified: Secondary | ICD-10-CM

## 2023-07-05 MED ORDER — TRELEGY ELLIPTA 200-62.5-25 MCG/ACT IN AEPB: 1.0000 | INHALATION_SPRAY | Freq: Every day | RESPIRATORY_TRACT | Status: DC

## 2023-07-05 MED ORDER — TRELEGY ELLIPTA 200-62.5-25 MCG/ACT IN AEPB: 1.0000 | INHALATION_SPRAY | Freq: Every day | RESPIRATORY_TRACT | 5 refills | Status: DC

## 2023-07-05 NOTE — Progress Notes (Signed)
[name]    [id number]    1946/04/01  Primary Care Physician:McKeown, Chrissie Noa, MD  Referring Physician: Adela Glimpse, NP [name] [place],  [place] [address]  Chief complaint: Consult for dyspnea  HPI: 77 y.o. who  has a past medical history of Abnormal findings on esophagogastroduodenoscopy (EGD) (07/2010), Aneurysm (HCC) (2003), Anxiety, Arthritis, Cataract, CKD (chronic kidney disease), stage IV (HCC), Colon polyps, Diabetes mellitus (1998), Diverticulosis, Emphysema of lung (HCC), ESOPHAGEAL STRICTURE (08/27/2008), Family history of adverse reaction to anesthesia, GERD (gastroesophageal reflux disease), Hemorrhoids, Hiatal hernia, Hyperlipidemia, Hypertension (2000), Migraines, Neuropathy, Peripheral vascular disease (HCC), Phlebitis, and Stroke (HCC).   Discussed the use of AI scribe software for clinical note transcription with the patient, who gave verbal consent to proceed.  [name], a 77 year old with a history of smoking, presents with shortness of breath that is exacerbated by walking. She reports that she is 'cool as a cucumber' at rest, but any physical exertion leads to breathlessness. She has a history of using an inhaler (Spiriva) which was prescribed in 2020, but discontinued due to throat soreness and coughing. She was given another inhaler a few months ago, but it also caused throat soreness and coughing. She carries it for emergencies but does not use it regularly.  In addition to the shortness of breath, she has been experiencing dizziness and blurred vision. She has a history of a four-bypass surgery on her leg and has regular check-ups every three months to monitor blood circulation. She reports that the blood does not circulate as well as the doctors would like. She is currently being evaluated for blood circulation from her neck to her brain.  She also reports high blood pressure that has been difficult to control, with readings often  above 200. She has severe anxiety, which she believes may contribute to her high blood pressure.  She has a history of asthma in childhood, but does not believe she currently has asthma. She is a light smoker, smoking about half a pack of cigarettes every day or day and a half. 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 She has been using a walker to get around due to her shortness of breath and joint pain in her knees and hips.   Pets: Cat Occupation: Used to work at a clerical job Exposures: No mold, hot tub, Financial controller.  No feather pillows or comforters Smoking history: 16-pack-year smoker.  Continues to smoke half pack per day Travel history: Originally from [place].  No significant recent travel Relevant family history: No family history of lung disease   Outpatient Encounter Medications as of 07/05/2023  Medication Sig   acetaminophen (TYLENOL) 500 MG tablet Take 1,000 mg by mouth every 6 (six) hours as needed for mild pain.   amLODipine (NORVASC) 10 MG tablet TAKE 1 TABLET BY MOUTH EVERY DAY FOR BLOOD PRESSURE   aspirin 325 MG EC tablet Take 325 mg by mouth at bedtime.   Blood Glucose Monitoring Suppl (ONETOUCH VERIO FLEX SYSTEM) w/Device KIT USE AS ADVISED   busPIRone (BUSPAR) 5 MG tablet TAKE 1 TABLET BY MOUTH THREE TIMES A DAY   carvedilol (COREG) 3.125 MG tablet Take 1 tablet (3.125 mg total) by mouth 2 (two) times daily with a meal.   dapagliflozin propanediol (FARXIGA) 10 MG TABS tablet Take 1 tablet (10 mg total) by mouth daily.   furosemide (LASIX) 40 MG tablet Take by mouth.   glucose  blood test strip Use as instructed 3x a day - One Touch Verio   insulin aspart (NOVOLOG FLEXPEN) 100 UNIT/ML FlexPen Inject 10-14 Units into the skin 3 (three) times daily before meals. (Patient taking differently: Inject 14 Units into the skin 2 (two) times daily before a meal.)   insulin degludec (TRESIBA FLEXTOUCH) 200 UNIT/ML FlexTouch Pen Inject 40 Units into the skin daily.    meclizine (ANTIVERT) 25 MG tablet 1/2 tab up to 3 times daily for motion sickness/dizziness. (Patient taking differently: Take 12.5 mg by mouth 3 (three) times daily as needed for dizziness. 1/2 tab up to 3 times daily for motion sickness/dizziness.)   Multiple Vitamins-Minerals (MULTIVITAMIN WITH MINERALS) tablet Take 1 tablet by mouth daily.   olmesartan (BENICAR) 40 MG tablet Take 1 tablet (40 mg total) by mouth daily. for blood pressure   Omega-3 Fatty Acids (FISH OIL) 1000 MG CAPS Take 1,000 mg by mouth in the morning and at bedtime.   OneTouch Delica Lancets 33G MISC Use 3x a day   pantoprazole (PROTONIX) 40 MG tablet Take 1 tablet (40 mg total) by mouth daily.   Polyethyl Glycol-Propyl Glycol (SYSTANE ULTRA OP) Place 1 drop into both eyes daily at 12 noon. Additional drop if needed for dry eyes   Potassium 99 MG TABS Take 1 tablet by mouth daily.   hydrALAZINE (APRESOLINE) 25 MG tablet Take 1 tablet (25 mg total) by mouth 3 (three) times daily.   No facility-administered encounter medications on file as of 07/05/2023.    Allergies as of 07/05/2023 - Review Complete 07/05/2023  Allergen Reaction Noted   Ciprofloxacin Swelling 03/27/2018   Ozempic (0.25 or 0.5 mg-dose) [semaglutide(0.25 or 0.5mg -dos)] Nausea And Vomiting 07/09/2019   Plavix [clopidogrel bisulfate] Palpitations 05/08/2012   Amoxicillin Itching and Swelling 05/03/2011   Ace inhibitors Cough 08/20/2013   Atorvastatin  04/15/2022   Lyrica [pregabalin] Itching and Nausea Only 08/20/2013   Penicillins Other (See Comments) 04/04/2007    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,  "small"   Anxiety    Arthritis    Cataract    CKD (chronic kidney disease), stage IV (HCC)    followed by [facility]   Colon polyps    Diabetes mellitus 1998   Diverticulosis    Emphysema of lung (HCC)    "no one has evry told me that"    ESOPHAGEAL STRICTURE 08/27/2008   Family history of adverse reaction to anesthesia    daughter n/v   GERD (gastroesophageal reflux disease)    Hemorrhoids    Hiatal hernia    Hyperlipidemia    Hypertension 2000   Migraines    Neuropathy    fingers and toe   Peripheral vascular disease (HCC)    Phlebitis    30 years ago  left leg   Stroke [facility])    multiple mini strokes ( brain aneurysm ).  Right side weaker.    Past Surgical History:  Procedure Laterality Date   ABDOMINAL AORTAGRAM N/A 01/04/2012   Procedure: ABDOMINAL AORTAGRAM;  Surgeon: Nada Libman, MD;  Location: [facility] CATH LAB;  Service: Cardiovascular;  Laterality: N/A;   ABDOMINAL AORTAGRAM N/A 08/15/2012   Procedure: ABDOMINAL Ronny Flurry;  Surgeon: Nada Libman, MD;  Location: [facility] CATH LAB;  Service: Cardiovascular;  Laterality: N/A;   ABDOMINAL AORTOGRAM W/LOWER EXTREMITY Left 11/17/2021   Procedure: ABDOMINAL AORTOGRAM W/LOWER EXTREMITY;  Surgeon: Myra Gianotti,  Fran Lowes, MD;  Location: [facility] INVASIVE CV LAB;  Service: Cardiovascular;  Laterality: Left;   ABDOMINAL AORTOGRAM W/LOWER EXTREMITY N/A 03/09/2022   Procedure: ABDOMINAL AORTOGRAM W/LOWER EXTREMITY;  Surgeon: Nada Libman, MD;  Location: [facility] INVASIVE CV LAB;  Service: Cardiovascular;  Laterality: N/A;   ABDOMINAL AORTOGRAM W/LOWER EXTREMITY N/A 03/15/2023   Procedure: ABDOMINAL AORTOGRAM W/LOWER EXTREMITY;  Surgeon: Nada Libman, MD;  Location: [facility] INVASIVE CV LAB;  Service: Cardiovascular;  Laterality: N/A;   ABDOMINAL HYSTERECTOMY  1984   cataract surgery Right 10-22-2015   cataract surgery Left 11-05-2015   CESAREAN SECTION     CHOLECYSTECTOMY     COLONOSCOPY  07/2010   DENTAL SURGERY  Aug. 16, 2013   left lower    ESOPHAGOGASTRODUODENOSCOPY  07/2010   EYE SURGERY  Nov. 2014   Laser-Glaucoma   FEMORAL ARTERY STENT  05/11/11   Left superficial femoral and popliteal artery   FEMORAL-POPLITEAL BYPASS GRAFT Left 11/18/2021   Procedure: LEFT FEMORAL-POPLITEAL  ARTERY BYPASS GRAFT;  Surgeon: Nada Libman, MD;  Location: [facility] OR;  Service: Vascular;  Laterality: Left;   HERNIA REPAIR     times two   KNEE SURGERY     LOWER EXTREMITY ANGIOGRAM Left 08/15/2012   Procedure: LOWER EXTREMITY ANGIOGRAM;  Surgeon: Nada Libman, MD;  Location: [facility] CATH LAB;  Service: Cardiovascular;  Laterality: Left;  lt leg angio   PERIPHERAL VASCULAR BALLOON ANGIOPLASTY Left 03/09/2022   Procedure: PERIPHERAL VASCULAR BALLOON ANGIOPLASTY;  Surgeon: Nada Libman, MD;  Location: [facility] INVASIVE CV LAB;  Service: Cardiovascular;  Laterality: Left;   PERIPHERAL VASCULAR BALLOON ANGIOPLASTY Left 03/15/2023   Procedure: PERIPHERAL VASCULAR BALLOON ANGIOPLASTY;  Surgeon: Nada Libman, MD;  Location: [facility] INVASIVE CV LAB;  Service: Cardiovascular;  Laterality: Left;   rotator cuff surgery     THYROID SURGERY      Family History  Problem Relation Age of Onset   CAD Mother 1       Died of MI   Hypertension Mother    Heart attack Mother    Heart disease Mother    CAD Father 18       Died of MI   Heart disease Father    Heart attack Father    CAD Brother 31       Two brothers died of MI   Heart attack Brother    Heart disease Brother        Amputation   Diabetes Sister    Hypertension Sister    Heart attack Brother    Heart disease Brother    Stroke Sister    Colon cancer Neg Hx    Stomach cancer Neg Hx    Esophageal cancer Neg Hx     Social History   Socioeconomic History   Marital status: Married    Spouse name: Not on file   Number of children: 2   Years of education: Not on file   Highest education level: Not on file  Occupational History   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  Tobacco Use   Smoking status: Light Smoker    Current packs/day: 0.40    Average packs/day: 0.4 packs/day for 40.0 years (16.0 ttl pk-yrs)    Types: Cigarettes    Passive exposure: Never   Smokeless tobacco: Never   Tobacco comments:    11/18/20 in process  of quiting    03/31/23 3 cigarettes a day   Vaping Use   Vaping status: Never  Used  Substance and Sexual Activity   Alcohol use: No    Alcohol/week: 0.0 standard drinks of alcohol   Drug use: Never   Sexual activity: Not on file  Other Topics Concern   Not on file  Social History Narrative   Lives with husband.        Social Determinants of Health   Financial Resource Strain: Not on file  Food Insecurity: No Food Insecurity (08/28/2022)   Hunger Vital Sign    Worried About Running Out of Food in the Last Year: Never true    Ran Out of Food in the Last Year: Never true  Transportation Needs: No Transportation Needs (08/28/2022)   PRAPARE - Administrator, Civil Service (Medical): No    Lack of Transportation (Non-Medical): No  Physical Activity: Not on file  Stress: Not on file  Social Connections: Not on file  Intimate Partner Violence: Not At Risk (08/28/2022)   Humiliation, Afraid, Rape, and Kick questionnaire    Fear of Current or Ex-Partner: No    Emotionally Abused: No    Physically Abused: No    Sexually Abused: No    Review of systems: Review of Systems  Constitutional: Negative for fever and chills.  HENT: Negative.   Eyes: Negative for blurred vision.  Respiratory: as per HPI  Cardiovascular: Negative for chest pain and palpitations.  Gastrointestinal: Negative for vomiting, diarrhea, blood per rectum. Genitourinary: Negative for dysuria, urgency, frequency and hematuria.  Musculoskeletal: Negative for myalgias, back pain and joint pain.  Skin: Negative for itching and rash.  Neurological: Negative for dizziness, tremors, focal weakness, seizures and loss of consciousness.  Endo/Heme/Allergies: Negative for environmental allergies.  Psychiatric/Behavioral: Negative for depression, suicidal ideas and hallucinations.  All other systems reviewed and are negative.  Physical Exam: Blood pressure (!) 142/74, pulse 71, temperature 98.5 F (36.9 C),  temperature source Oral, height 5\' 6"  (1.676 m), weight 192 lb 12.8 oz (87.5 kg), SpO2 98%. Gen:      No acute distress HEENT:  EOMI, sclera anicteric Neck:     No masses; no thyromegaly Lungs:    Clear to auscultation bilaterally; normal respiratory effort CV:         Regular rate and rhythm; no murmurs Abd:      + bowel sounds; soft, non-tender; no palpable masses, no distension Ext:    No edema; adequate peripheral perfusion Skin:      Warm and dry; no rash Neuro: alert and oriented x 3 Psych: normal mood and affect  Data Reviewed: Imaging: CT chest 10/16/20 Ground glass nodule in right apex. Emphysema, coronary artery calcifications.  I have reviewed the images personally  PFTs:  Labs:  Assessment and Plan Chronic Obstructive Pulmonary Disease (COPD) Shortness of breath with exertion. Prior inhaler use (Spiriva) discontinued due to throat irritation and cough. Prior PFT in 2020 suggestive of COPD. -Order Trelegy for daily use and instruct patient to try it. -Schedule repeat PFT, acknowledging delay due to backlog.  Possible Allergy (Cat) Patient has a cat and expressed concern about potential allergy. -Order blood tests to assess for inflammation from asthma and potential cat allergy.  Lung Nodule Ground glass nodule in right upper lobe identified in 2022 with no follow-up imaging since. -Order CT scan to reassess nodule.  Peripheral Vascular Disease History of four bypasses on leg with ongoing monitoring for blood circulation. No current complaints related to this condition.  Hypertension History of high blood pressure, including an episode of blood pressure  over 200. No current complaints or changes in management discussed.  Anxiety Patient reports high levels of anxiety. No changes in management discussed.   Recommendations: Trelegy, PFTs Allergy tests CT chest  Chilton Greathouse MD  Pulmonary and Critical Care 07/05/2023, 4:21 PM  CC: Adela Glimpse,  NP

## 2023-07-05 NOTE — Patient Instructions (Signed)
VISIT SUMMARY:  Dear [name], during your visit, we discussed your shortness of breath, dizziness, blurred vision, and high blood pressure. We also talked about your history of smoking and the potential impact on your health. We have outlined a plan to manage these issues and improve your overall health.  YOUR PLAN:  -CHRONIC OBSTRUCTIVE PULMONARY DISEASE (COPD): COPD is a lung disease that makes it hard to breathe and is often caused by long-term exposure to lung irritants, like smoking. We will order a new inhaler for you to try and schedule a repeat Pulmonary Function Test (PFT) to assess your lung function.  -POSSIBLE ALLERGY (CAT): You expressed concern about a potential allergy to your cat. We will order blood tests to check for inflammation that could be caused by asthma or an allergy to your cat.  -LUNG NODULE: A lung nodule is a small round or oval-shaped growth in the lung. We will order a CT scan to reassess the nodule that was identified in your right upper lobe in 2022.  INSTRUCTIONS:  Please make sure to use the new inhaler as instructed and report any side effects. Also, please schedule and attend the follow-up appointments for the PFT and CT scan. We will contact you with the results of the blood tests for potential cat allergy. Continue to monitor your blood pressure and anxiety levels, and contact us if there are any significant changes.

## 2023-07-06 DIAGNOSIS — H02881 Meibomian gland dysfunction right upper eyelid: Secondary | ICD-10-CM | POA: Diagnosis not present

## 2023-07-06 DIAGNOSIS — H40023 Open angle with borderline findings, high risk, bilateral: Secondary | ICD-10-CM | POA: Diagnosis not present

## 2023-07-06 DIAGNOSIS — E113393 Type 2 diabetes mellitus with moderate nonproliferative diabetic retinopathy without macular edema, bilateral: Secondary | ICD-10-CM | POA: Diagnosis not present

## 2023-07-06 DIAGNOSIS — H35033 Hypertensive retinopathy, bilateral: Secondary | ICD-10-CM | POA: Diagnosis not present

## 2023-07-06 LAB — CBC WITH DIFFERENTIAL/PLATELET
Basophils Absolute: 0.2 10*3/uL — ABNORMAL HIGH (ref 0.0–0.1)
Basophils Relative: 1.3 % (ref 0.0–3.0)
Eosinophils Absolute: 0.2 10*3/uL (ref 0.0–0.7)
Eosinophils Relative: 1.9 % (ref 0.0–5.0)
HCT: 46.2 % — ABNORMAL HIGH (ref 36.0–46.0)
Hemoglobin: 15.1 g/dL — ABNORMAL HIGH (ref 12.0–15.0)
Lymphocytes Relative: 20.2 % (ref 12.0–46.0)
Lymphs Abs: 2.4 10*3/uL (ref 0.7–4.0)
MCHC: 32.7 g/dL (ref 30.0–36.0)
MCV: 99.6 fL (ref 78.0–100.0)
Monocytes Absolute: 0.8 10*3/uL (ref 0.1–1.0)
Monocytes Relative: 6.4 % (ref 3.0–12.0)
Neutro Abs: 8.3 10*3/uL — ABNORMAL HIGH (ref 1.4–7.7)
Neutrophils Relative %: 70.2 % (ref 43.0–77.0)
Platelets: 252 10*3/uL (ref 150.0–400.0)
RBC: 4.64 Mil/uL (ref 3.87–5.11)
RDW: 12.2 % (ref 11.5–15.5)
WBC: 11.9 10*3/uL — ABNORMAL HIGH (ref 4.0–10.5)

## 2023-07-06 LAB — HM DIABETES EYE EXAM

## 2023-07-07 ENCOUNTER — Encounter: Payer: Self-pay | Admitting: Internal Medicine

## 2023-07-08 LAB — ALLERGEN PROFILE, PERENNIAL ALLERGEN IGE

## 2023-07-19 ENCOUNTER — Encounter: Payer: Self-pay | Admitting: Pulmonary Disease

## 2023-07-20 MED ORDER — TRELEGY ELLIPTA 200-62.5-25 MCG/ACT IN AEPB: 1.0000 | INHALATION_SPRAY | Freq: Every day | RESPIRATORY_TRACT | 3 refills | Status: DC

## 2023-07-21 ENCOUNTER — Ambulatory Visit
Admission: RE | Admit: 2023-07-21 | Discharge: 2023-07-21 | Disposition: A | Discharge status: Home / Self Care | Payer: Medicare Other | Source: Ambulatory Visit | Attending: Pulmonary Disease | Admitting: Pulmonary Disease

## 2023-07-21 DIAGNOSIS — R918 Other nonspecific abnormal finding of lung field: Secondary | ICD-10-CM | POA: Diagnosis not present

## 2023-07-21 DIAGNOSIS — I2510 Atherosclerotic heart disease of native coronary artery without angina pectoris: Secondary | ICD-10-CM | POA: Diagnosis not present

## 2023-07-21 DIAGNOSIS — R911 Solitary pulmonary nodule: Secondary | ICD-10-CM

## 2023-07-21 DIAGNOSIS — I700 Atherosclerosis of aorta: Secondary | ICD-10-CM | POA: Diagnosis not present

## 2023-07-22 ENCOUNTER — Encounter: Payer: Self-pay | Admitting: Internal Medicine

## 2023-07-25 ENCOUNTER — Other Ambulatory Visit (HOSPITAL_COMMUNITY): Payer: Medicare Other

## 2023-07-25 ENCOUNTER — Encounter (HOSPITAL_COMMUNITY): Payer: Medicare Other

## 2023-07-25 ENCOUNTER — Ambulatory Visit: Payer: Medicare Other

## 2023-07-29 ENCOUNTER — Other Ambulatory Visit: Payer: Self-pay

## 2023-07-29 DIAGNOSIS — I6523 Occlusion and stenosis of bilateral carotid arteries: Secondary | ICD-10-CM

## 2023-07-29 DIAGNOSIS — I70213 Atherosclerosis of native arteries of extremities with intermittent claudication, bilateral legs: Secondary | ICD-10-CM

## 2023-08-03 ENCOUNTER — Encounter: Payer: Self-pay | Admitting: Internal Medicine

## 2023-08-04 NOTE — Progress Notes (Signed)
COMPLETE PHYSICAL   Assessment:    Theory was seen today for annual exam.  Diagnoses and all orders for this visit:  Encounter for routine adult medical exam with abnormal findings Yearly  Essential hypertension Continue current medications: hydralazine 25 mg TID ,carvedilol 3.125 mg BID, Amlodipine 10 mg and Olmesartan 40 mg QD Monitor blood pressure at home; call if consistently over 130/80 Continue DASH diet.   Reminder to go to the ER if any CP, SOB, nausea, dizziness, severe HA, changes vision/speech, left arm numbness and tingling and jaw pain. -     CBC with Differential/Platelet -     COMPLETE METABOLIC PANEL WITH GFR -     TSH -     Magnesium -     EKG 12-Lead  Hyperlipidemia associated with type 2 diabetes mellitus (HCC) Continue medications:rosuvastatin 10mg  Discussed dietary and exercise modifications Low fat diet -     Lipid panel  GERD Continue pantoprazole 40 mg every day  Sleep with head on 2 pillows Continue to avoid trigger foods and eating too close to bedtime  Anxiety state Continue Citalopram Add Buspirone 5 mg TID as needed If symptoms are not controlled notify the office  Peripheral vascular disease due to secondary diabetes mellitus (HCC) Control blood pressure, lipids and glucose Disscused lifestyle modifications, diet & exercise Continue to monitor  PAOD (peripheral arterial occlusive disease) (HCC) Continue to follow with vascular surgery- Dr. Myra Gianotti   Type 2 diabetes mellitus with stage 4 chronic kidney disease, with long-term current use of insulin (HCC) Continue medications : Novolog 70/30 mix on 10-14 units before each meal, Tresiba 40 units QAM,  farxiga 10 mg every day  Follows with endocrinology-Dr Elvera Lennox and nephrology Dr. Thedore Mins Discussed general issues about diabetes pathophysiology and management. Education: Reviewed 'ABCs' of diabetes management (respective goals in parentheses):  A1C (<7), blood pressure (<130/80), and  cholesterol (LDL <70) Dietary recommendations Encouraged aerobic exercise.  Discussed foot care, check daily Yearly retinal exam Dental exam every 6 months Monitor blood glucose, discussed goal for patient   History of CVA (cerebrovascular accident) Control blood pressure, lipids and glucose Disscused lifestyle modifications, diet & exercise Continue to monitor  Vitamin D deficiency Continue Vit D supplementation to maintain value in therapeutic level of 60-100  -     VITAMIN D 25 Hydroxy (Vit-D Deficiency, Fractures)  CKD stage 4 due to type 2 diabetes mellitus (HCC) Increase fluids, have discussed this at length with patient Avoid NSAIDS Blood pressure control Monitor sugars, needs better glucose control Continue to follow with  [facility] related to continually decline in kidney function -     Microalbumin / creatinine urine ratio -     Urinalysis, Routine w reflex microscopic  Atherosclerosis of aorta (HCC) Per CT 06/19/20 Control blood pressure, lipids and glucose Disscused lifestyle modifications, diet & exercise Continue to monitor  Depression, major, recurrent, in partial remission (HCC) Discussed stress management techniques  Discussed, increase water,intake & good sleep hygiene  Discussed increasing exercise & vegetables in diet  Neuropathy due to secondary diabetes (HCC) Control glucose Continue to follow with endocrinology  Medication management -Continued  Pulmonary emphysema, unspecified emphysema type (HCC)/ Nicotine dependence - CT lung cancer screen ordered by pulmonology Strongly encouraged to stop smoking Continue medications and follow with Dr. Isaiah Serge- pulmonology  History of CVA (cerebrovascular accident) Control blood pressure, cholesterol, glucose, increase exercise.   Nontoxic multinodular goiter Monitor  Diverticulitis Diet low in seeds, nuts, red meat Flagyl 500 mg BID x 7 and  Bactrim 400/80 bis X 7 with food If pain doe not  resolve notify the office  Restless leg syndrome Dr. Lanice Shirts started Clonazepam many years ago. Clonazepam was stopped and symptoms have worsened Ropinirole 0.25mg  q night  Arthritis/joint pain of multiple joints Refer to [facility]  Insomnia Tried Trazodone but was not tolerated.  Obesity with Type 2 DM(HCC) Long discussion about weight loss, diet, and exercise Recommended diet heavy in fruits and veggies and low in animal meats, cheeses, and dairy products, appropriate calorie intake Patient will work on decreasing saturated fats, simple carbs and increasing activity Follow up at next visit     Future Appointments  Date Time Provider Department Center  08/09/2023  1:00 PM [facility] 3 [facility]  08/09/2023  1:30 PM [facility]  08/09/2023  2:00 PM [facility]  08/15/2023  3:15 PM [facility]  10/31/2023  2:00 PM Carlus Pavlov, MD [facility] None  08/07/2024  2:00 PM [name], NP [facility] None  '    Subjective:   [name] is a 77 y.o. female who presents for CPE and 3 month follow up on hypertension, diabetes, hyperlipidemia, vitamin D def.   On 11/17/21 abdominal aortagram with lower extremity. On 11/18/21 left femoral popliteal artery bypass graft. She had abdominal aortagram with lower extremity peripheral vascular balloon angioplasty on 03/09/22.  Wears support hose daily. She does have some numbness and aching in left lower leg.She continues to follow with vascular surgery Dr. Myra Gianotti. She goes to see them again tomorrow for vascular studies.   Is having pain in knees, fingers, toes and right hip. She has been using bee venom cream and states it has helped somewhat.   When she stands to cook it causes knee, hip and abdominal pain.   Her blood pressure has been controlled at home(120's/70's) on Amlodipine 10 mg and Olmesartan 40 mg QD, today their BP is BP: (!) 142/70 . She had just taken her BP  medications.  She is under a lot of stress. She is to also be on Hydralazine 25 mg TID and carvedilol 3.125 mg BID but she stopped 3 weeks ago because she was having itching all over body..  BP Readings from Last 3 Encounters:  08/08/23 (!) 142/70  07/05/23 (!) 142/74  07/04/23 (!) 142/58  Denies chest pain, shortness of breath and dizziness  Sees Dr. Marjory Lies yearly for previous CVA x 2, as well as several TIA. Occasional headaches but are improved.   She does have some heartburn symptoms, worse when laying down at night.  Symptoms are similar since when she was on Pantoprazole. Tries to control with food.   She has COPD, has lung nodules. Continues to smoke 5-6 cigarettes a day. . Does have l shortness of breath with exertion. Follows with Dr. Isaiah Serge who ordered a repeat chest CT. She has been using Trelegy and it is helping with wheezing and shortness of breath. Is to have repeat pulmonary functions at next visit.   She is having a lot more anxiety and does not feel her symptoms are controlled and uses Buspar 5 mg 1-2 times a day, usually at night.   She follows with Dr. Thedore Mins for CKD stage 4. Kidney functions have stayed stable.  Lab Results  Component Value Date   EGFR 23 (L) 03/31/2023    BMI is Body mass index is 31.02 kg/m., she has been working on diet and exercise.Exercise  is difficult due to PAD and neuropathy Wt Readings from Last 3 Encounters:  08/08/23 192 lb 3.2 oz (87.2 kg)  07/05/23 192 lb 12.8 oz (87.5 kg)  07/04/23 191 lb 9.6 oz (86.9 kg)    She has several comorbidities from her DM including Neuropathy Retinopathy Dr. Bascom Levels 06/2023- follow up 6 months Hyperlipidemia at goal less than 70, on crestor 40 mg CKD she is on ARB (off metformin due to CKDq)  history of TIA she is on 325 ASA PAD s/p stents , Rosuvastatin 40 mg QD for cholesterol and is controlled  Novolog 70/30 mix on 10-14 units before each meal BS running 80-104 Tresiba 40 units QAM She is on  farxiga 10 mg every day and tolerating it well she been working on diet and exercise for diabetes and following with Dr. Reece Agar denies polydipsia, polyuria and visual disturbances.  Last A1C in the office was:  Lab Results  Component Value Date   HGBA1C 8.4 (A) 07/04/2023    Lab Results  Component Value Date   CHOL 152 08/04/2022   HDL 54 08/04/2022   LDLCALC 74 08/04/2022   LDLDIRECT 146.4 01/30/2010   TRIG 154 (H) 08/04/2022   CHOLHDL 2.8 08/04/2022   Lab Results  Component Value Date   EGFR 23 (L) 03/31/2023     She is drinking lots of water, follows with nephrology. Patient is on Vitamin D. Lab Results  Component Value Date   VD25OH 29 (L) 08/04/2022     Names of Other Physician/Practitioners you currently use: 1. [facility]- here for primary care 2. Dr. Bascom Levels, DIABETIC eye doctor, last visit 07/06/23 Patient Care Team: Lucky Cowboy, MD as PCP - General (Internal Medicine) Rollene Rotunda, MD as PCP - Cardiology (Cardiology) Amelia Jo, MD as Consulting Physician (Neurology) Nada Libman, MD as Consulting Physician (Vascular Surgery) Hilarie Fredrickson, MD as Consulting Physician (Gastroenterology) Helane Gunther, DPM as Consulting Physician (Podiatry) Dimitri Ped, MD as Consulting Physician (Surgery) Delight Ovens, MD (Inactive) as Consulting Physician (Cardiothoracic Surgery) Lajoyce Corners, [facility] (Inactive) as Pharmacist (Pharmacist)  Medication Review  Current Outpatient Medications (Endocrine & Metabolic):    dapagliflozin propanediol (FARXIGA) 10 MG TABS tablet, Take 1 tablet (10 mg total) by mouth daily.   insulin aspart (NOVOLOG FLEXPEN) 100 UNIT/ML FlexPen, Inject 10-14 Units into the skin 3 (three) times daily before meals. (Patient taking differently: Inject 14 Units into the skin 2 (two) times daily before a meal.)   insulin degludec (TRESIBA FLEXTOUCH) 200 UNIT/ML FlexTouch Pen, Inject 40 Units into  the skin daily.  Current Outpatient Medications (Cardiovascular):    amLODipine (NORVASC) 10 MG tablet, TAKE 1 TABLET BY MOUTH EVERY DAY FOR BLOOD PRESSURE   olmesartan (BENICAR) 40 MG tablet, Take 1 tablet (40 mg total) by mouth daily. for blood pressure   carvedilol (COREG) 3.125 MG tablet, Take 1 tablet (3.125 mg total) by mouth 2 (two) times daily with a meal.   hydrALAZINE (APRESOLINE) 25 MG tablet, Take 1 tablet (25 mg total) by mouth 3 (three) times daily.  Current Outpatient Medications (Respiratory):    Fluticasone-Umeclidin-Vilant (TRELEGY ELLIPTA) 200-62.5-25 MCG/ACT AEPB, Inhale 1 puff into the lungs daily.  Current Outpatient Medications (Analgesics):    acetaminophen (TYLENOL) 500 MG tablet, Take 1,000 mg by mouth every 6 (six) hours as needed for mild pain.   aspirin 325 MG EC tablet, Take 325 mg by mouth at bedtime.   Current Outpatient Medications (Other):    Blood Glucose  Monitoring Suppl (ONETOUCH VERIO FLEX SYSTEM) w/Device KIT, USE AS ADVISED   busPIRone (BUSPAR) 5 MG tablet, TAKE 1 TABLET BY MOUTH THREE TIMES A DAY   glucose blood test strip, Use as instructed 3x a day - One Touch Verio   meclizine (ANTIVERT) 25 MG tablet, 1/2 tab up to 3 times daily for motion sickness/dizziness. (Patient taking differently: Take 12.5 mg by mouth 3 (three) times daily as needed for dizziness. 1/2 tab up to 3 times daily for motion sickness/dizziness.)   Omega-3 Fatty Acids (FISH OIL) 1000 MG CAPS, Take 1,000 mg by mouth in the morning and at bedtime.   OneTouch Delica Lancets 33G MISC, Use 3x a day   Polyethyl Glycol-Propyl Glycol (SYSTANE ULTRA OP), Place 1 drop into both eyes daily at 12 noon. Additional drop if needed for dry eyes   Potassium 99 MG TABS, Take 1 tablet by mouth daily.   Multiple Vitamins-Minerals (MULTIVITAMIN WITH MINERALS) tablet, Take 1 tablet by mouth daily.   pantoprazole (PROTONIX) 40 MG tablet, Take 1 tablet (40 mg total) by mouth daily.  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) 12/11/2021   Pain in right knee 01/07/2020   Pain in left knee 01/07/2020   Carotid artery stenosis 12/03/2019   Migraine with aura and without status migrainosus, not intractable 11/28/2019   Hyperlipidemia associated with type 2 diabetes mellitus (HCC) 11/10/2019   Coronary artery calcification 08/05/2019   PAOD (peripheral arterial occlusive disease) (HCC) 02/26/2019   SOB (shortness of breath) 11/17/2018   Emphysema of lung (HCC) 10/10/2017   Atherosclerosis of aorta (HCC) 10/10/2017   Lung nodule 10/10/2017   Anxiety 08/22/2017   Arthritis 08/22/2017   Neuropathy due to secondary diabetes (HCC) 08/22/2017   Type 2 diabetes mellitus with circulatory disorder, with long-term current use of insulin (HCC) 11/07/2015   Encounter for Medicare annual wellness exam 06/24/2015   Generalized anxiety disorder 03/26/2015   Depression, major, recurrent, in partial remission (HCC) 03/26/2015   History of CVA (cerebrovascular accident) 10/04/2014   Tobacco abuse 10/04/2014   Vitamin D deficiency 11/02/2013   Medication management 11/02/2013   Peripheral vascular disease due to secondary diabetes mellitus (HCC) 05/08/2012   Atherosclerosis of native arteries of extremity with intermittent claudication 12/24/2011   Diverticulosis of large intestine 06/09/2010   History of colonic polyps 06/09/2010   Esophageal reflux 08/27/2008   Nontoxic multinodular goiter 03/11/2008   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/11/2008   Brain aneurysm 03/11/2008   CKD stage 4 due to type 2 diabetes mellitus (HCC) 05/01/2007   Essential  hypertension 05/01/2007    Screening Tests Immunization History  Administered Date(s) Administered   DT (Pediatric) 03/26/2015   Fluad Quad(high Dose 65+) 07/09/2019, 07/17/2022   Influenza Whole 08/02/2007, 06/18/2009, 07/09/2013   Influenza, High Dose Seasonal PF 08/05/2015, 07/22/2016, 07/04/2017, 06/30/2020   Influenza-Unspecified 07/17/2021   Moderna Covid-19 Vaccine Bivalent Booster 91yrs & up 07/17/2022   PFIZER Comirnaty(Gray Top)Covid-19 Tri-Sucrose Vaccine 10/13/2019, 11/03/2019   PFIZER(Purple Top)SARS-COV-2 Vaccination 06/30/2020   Pfizer Covid-19 Vaccine Bivalent Booster 18yrs & up 06/17/2021   Pneumococcal Conjugate-13 07/22/2016   Pneumococcal Polysaccharide-23 02/08/2014   Td 09/21/2004  Tdap 10/06/2021   Unspecified SARS-COV-2 Vaccination 10/13/2019, 11/03/2019    Allergies Allergies  Allergen Reactions   Ciprofloxacin Swelling   Ozempic (0.25 Or 0.5 Mg-Dose) [Semaglutide(0.25 Or 0.5mg -Dos)] Nausea And Vomiting   Plavix [Clopidogrel Bisulfate] Palpitations   Amoxicillin Itching and Swelling    FACE & EYES SWELL   Ace Inhibitors Cough    Sore throat   Atorvastatin     myalgias   Lyrica [Pregabalin] Itching and Nausea Only     gain weight   Penicillins Other (See Comments)    Pt unsure if there is an allergic reaction     SURGICAL HISTORY She  has a past surgical history that includes Cesarean section; Cholecystectomy; Knee surgery; rotator cuff surgery; Thyroid surgery; Hernia repair; Abdominal hysterectomy (1984); Femoral artery stent (05/11/11); Dental surgery (Aug. 16, 2013); Eye surgery (Nov. 2014); Esophagogastroduodenoscopy (07/2010); Colonoscopy (07/2010); abdominal aortagram (N/A, 01/04/2012); abdominal aortagram (N/A, 08/15/2012); lower extremity angiogram (Left, 08/15/2012); cataract surgery (Right, 10-22-2015); cataract surgery (Left, 11-05-2015); ABDOMINAL AORTOGRAM W/LOWER EXTREMITY (Left, 11/17/2021); Femoral-popliteal Bypass Graft (Left,  11/18/2021); ABDOMINAL AORTOGRAM W/LOWER EXTREMITY (N/A, 03/09/2022); PERIPHERAL VASCULAR BALLOON ANGIOPLASTY (Left, 03/09/2022); ABDOMINAL AORTOGRAM W/LOWER EXTREMITY (N/A, 03/15/2023); and PERIPHERAL VASCULAR BALLOON ANGIOPLASTY (Left, 03/15/2023). FAMILY HISTORY Her family history includes CAD (age of onset: 32) in her brother; CAD (age of onset: 4) in her father and mother; Diabetes in her sister; Heart attack in her brother, brother, father, and mother; Heart disease in her brother, brother, father, and mother; Hypertension in her mother and sister; Stroke in her sister. SOCIAL HISTORY She  reports that she has been smoking cigarettes. She has a 16 pack-year smoking history. She has never been exposed to tobacco smoke. She has never used smokeless tobacco. She reports that she does not drink alcohol and does not use drugs.  Review of Systems  Constitutional:  Positive for malaise/fatigue. Negative for weight loss.  HENT:  Positive for congestion. Negative for hearing loss, sinus pain and tinnitus.   Eyes:  Negative for blurred vision.  Respiratory:  Positive for wheezing. Negative for cough, sputum production and shortness of breath.   Cardiovascular:  Positive for claudication and PND. Negative for chest pain, palpitations and leg swelling.  Gastrointestinal:  Negative for abdominal pain, constipation, diarrhea, heartburn, nausea and vomiting.  Genitourinary:  Negative for dysuria.  Musculoskeletal:  Positive for joint pain.  Neurological:  Positive for sensory change, weakness (R>L) and headaches. Negative for dizziness.       RLS  Endo/Heme/Allergies:  Does not bruise/bleed easily.  Psychiatric/Behavioral:  Positive for memory loss. Negative for depression.     Objective:   Blood pressure (!) 142/70, pulse 71, temperature (!) 97.3 F (36.3 C), height 5\' 6"  (1.676 m), weight 192 lb 3.2 oz (87.2 kg), SpO2 96%. Body mass index is 31.02 kg/m.  General appearance: alert, no distress, WD/WN,   female HEENT: normocephalic, sclerae anicteric, TMs pearly, nares patent, no discharge or erythema, pharynx normal. Oral cavity: MMM, no lesions Neck: supple, no lymphadenopathy, no thyromegaly, no masses. Bruit heard bilaterally L>R Heart: RRR, normal S1, S2, no murmurs Lungs: CTA bilaterally, no wheezes, rhonchi, or rales Abdomen: +bs, soft, tender in L and R lower quadrant Skin: warm, dry Musculoskeletal: nontender, no swelling, no obvious deformity Extremities: no edema, no cyanosis, no clubbing Pulses:DP and PT diminished on left and right Neurological: alert, oriented x 3, CN2-12 intact, strength diminished right foot and hand,  Diminished monofilament on all toes but great toe bilaterally.DTRs 2+ throughout, no cerebellar signs, gait normal  Psychiatric: normal affect, behavior normal, pleasant  Breast: defer Gyn: defer Rectal: defer VOZ:DGUYQ to cardiology   [name], NP   08/08/2023

## 2023-08-08 ENCOUNTER — Encounter: Payer: Self-pay | Admitting: Nurse Practitioner

## 2023-08-08 ENCOUNTER — Ambulatory Visit (INDEPENDENT_AMBULATORY_CARE_PROVIDER_SITE_OTHER): Payer: 59 | Admitting: Nurse Practitioner

## 2023-08-08 VITALS — BP 142/70 | HR 71 | Temp 97.3°F | Ht 66.0 in | Wt 192.2 lb

## 2023-08-08 DIAGNOSIS — G4700 Insomnia, unspecified: Secondary | ICD-10-CM

## 2023-08-08 DIAGNOSIS — J439 Emphysema, unspecified: Secondary | ICD-10-CM

## 2023-08-08 DIAGNOSIS — N184 Chronic kidney disease, stage 4 (severe): Secondary | ICD-10-CM

## 2023-08-08 DIAGNOSIS — E1351 Other specified diabetes mellitus with diabetic peripheral angiopathy without gangrene: Secondary | ICD-10-CM

## 2023-08-08 DIAGNOSIS — E559 Vitamin D deficiency, unspecified: Secondary | ICD-10-CM | POA: Diagnosis not present

## 2023-08-08 DIAGNOSIS — E1122 Type 2 diabetes mellitus with diabetic chronic kidney disease: Secondary | ICD-10-CM | POA: Diagnosis not present

## 2023-08-08 DIAGNOSIS — E042 Nontoxic multinodular goiter: Secondary | ICD-10-CM

## 2023-08-08 DIAGNOSIS — Z79899 Other long term (current) drug therapy: Secondary | ICD-10-CM | POA: Diagnosis not present

## 2023-08-08 DIAGNOSIS — E1340 Other specified diabetes mellitus with diabetic neuropathy, unspecified: Secondary | ICD-10-CM

## 2023-08-08 DIAGNOSIS — I700 Atherosclerosis of aorta: Secondary | ICD-10-CM

## 2023-08-08 DIAGNOSIS — K219 Gastro-esophageal reflux disease without esophagitis: Secondary | ICD-10-CM

## 2023-08-08 DIAGNOSIS — E1169 Type 2 diabetes mellitus with other specified complication: Secondary | ICD-10-CM | POA: Diagnosis not present

## 2023-08-08 DIAGNOSIS — E669 Obesity, unspecified: Secondary | ICD-10-CM | POA: Diagnosis not present

## 2023-08-08 DIAGNOSIS — I10 Essential (primary) hypertension: Secondary | ICD-10-CM

## 2023-08-08 DIAGNOSIS — M2550 Pain in unspecified joint: Secondary | ICD-10-CM

## 2023-08-08 DIAGNOSIS — G2581 Restless legs syndrome: Secondary | ICD-10-CM

## 2023-08-08 DIAGNOSIS — Z23 Encounter for immunization: Secondary | ICD-10-CM | POA: Diagnosis not present

## 2023-08-08 DIAGNOSIS — F419 Anxiety disorder, unspecified: Secondary | ICD-10-CM

## 2023-08-08 DIAGNOSIS — E1159 Type 2 diabetes mellitus with other circulatory complications: Secondary | ICD-10-CM

## 2023-08-08 DIAGNOSIS — K5792 Diverticulitis of intestine, part unspecified, without perforation or abscess without bleeding: Secondary | ICD-10-CM

## 2023-08-08 DIAGNOSIS — E785 Hyperlipidemia, unspecified: Secondary | ICD-10-CM

## 2023-08-08 DIAGNOSIS — G8929 Other chronic pain: Secondary | ICD-10-CM

## 2023-08-08 DIAGNOSIS — Z794 Long term (current) use of insulin: Secondary | ICD-10-CM

## 2023-08-08 DIAGNOSIS — I779 Disorder of arteries and arterioles, unspecified: Secondary | ICD-10-CM | POA: Diagnosis not present

## 2023-08-08 DIAGNOSIS — Z0001 Encounter for general adult medical examination with abnormal findings: Secondary | ICD-10-CM

## 2023-08-08 DIAGNOSIS — Z8673 Personal history of transient ischemic attack (TIA), and cerebral infarction without residual deficits: Secondary | ICD-10-CM

## 2023-08-08 DIAGNOSIS — F17200 Nicotine dependence, unspecified, uncomplicated: Secondary | ICD-10-CM

## 2023-08-08 MED ORDER — METRONIDAZOLE 500 MG PO TABS: 500.0000 mg | ORAL_TABLET | Freq: Two times a day (BID) | ORAL | 0 refills | Status: DC

## 2023-08-08 MED ORDER — SULFAMETHOXAZOLE-TRIMETHOPRIM 400-80 MG PO TABS
1.0000 | ORAL_TABLET | Freq: Two times a day (BID) | ORAL | 0 refills | Status: AC
Start: 2023-08-08 — End: 2023-08-15

## 2023-08-08 NOTE — Patient Instructions (Signed)
Bactrim twice a day with food for 7 days Flagyl twice a day with food for 7 days  Referral has been placed with [facility] for arthritis/joint pain  Diverticulitis  Diverticulitis is when small pouches in your colon get infected or swollen. This causes pain in your belly (abdomen) and watery poop (diarrhea). The small pouches are called diverticula. They may form if you have a condition called diverticulosis. What are the causes? You may get this condition if poop (stool) gets trapped in the pouches in your colon. The poop lets germs (bacteria) grow. This causes an infection. What increases the risk? You are more likely to get this condition if you have small pouches in your colon. You are also more likely to get it if: You are overweight or very overweight (obese). You do not exercise enough. You drink alcohol. You smoke. You eat a lot of red meat, like beef, pork, or lamb. You do not eat enough fiber. You are older than 76 years of age. What are the signs or symptoms? Pain in your belly. Pain is often on the left side, but it may be felt in other spots too. Fever and chills. Feeling like you may vomit. Vomiting. Having cramps. Feeling full. Changes in how often you poop. Blood in your poop. How is this treated? Most cases are treated at home. You may be told to: Take over-the-counter pain medicines. Only eat and drink clear liquids. Take antibiotics. Rest. Very bad cases may need to be treated at a hospital. Treatment may include: Not eating or drinking. Taking pain medicines. Getting antibiotics through an IV tube. Getting fluid and food through an IV tube. Having surgery. When you are feeling better, you may need to have a test to look at your colon (colonoscopy). Follow these instructions at home: Medicines Take over-the-counter and prescription medicines only as told by your doctor. These include: Fiber pills. Probiotics. Medicines to make your poop soft (stool  softeners). If you were prescribed antibiotics, take them as told by your doctor. Do not stop taking them even if you start to feel better. Ask your doctor if you should avoid driving or using machines while you are taking your medicine. Eating and drinking  Follow the diet told by your doctor. You may need to only eat and drink liquids. When you feel better, you may be able to eat more foods. You may also be told to eat a lot of fiber. Fiber helps you poop. Foods with fiber include berries, beans, lentils, and green vegetables. Try not to eat red meat. General instructions Do not smoke or use any products that contain nicotine or tobacco. If you need help quitting, ask your doctor. Exercise 3 or more times a week. Try to go for 30 minutes each time. Exercise enough to sweat and make your heart beat faster. Contact a doctor if: Your pain gets worse. You are not pooping like normal. Your symptoms do not get better. Your symptoms get worse very fast. You have a fever. You vomit more than one time. You have poop that is: Bloody. Black. Tarry. This information is not intended to replace advice given to you by your health care provider. Make sure you discuss any questions you have with your health care provider. Document Revised: 06/03/2022 Document Reviewed: 06/03/2022 Elsevier Patient Education  2024 [employer].

## 2023-08-09 ENCOUNTER — Ambulatory Visit (INDEPENDENT_AMBULATORY_CARE_PROVIDER_SITE_OTHER)
Admission: RE | Admit: 2023-08-09 | Discharge: 2023-08-09 | Disposition: A | Discharge status: Home / Self Care | Payer: Medicare Other | Source: Ambulatory Visit | Attending: Vascular Surgery | Admitting: Vascular Surgery

## 2023-08-09 ENCOUNTER — Ambulatory Visit (HOSPITAL_COMMUNITY)
Admission: RE | Admit: 2023-08-09 | Discharge: 2023-08-09 | Disposition: A | Discharge status: Home / Self Care | Payer: Medicare Other | Source: Ambulatory Visit | Attending: Vascular Surgery | Admitting: Vascular Surgery

## 2023-08-09 ENCOUNTER — Ambulatory Visit (INDEPENDENT_AMBULATORY_CARE_PROVIDER_SITE_OTHER)
Admission: RE | Admit: 2023-08-09 | Discharge: 2023-08-09 | Disposition: A | Discharge status: Home / Self Care | Payer: Medicare Other | Source: Ambulatory Visit | Attending: Vascular Surgery

## 2023-08-09 DIAGNOSIS — I739 Peripheral vascular disease, unspecified: Secondary | ICD-10-CM

## 2023-08-09 DIAGNOSIS — I70213 Atherosclerosis of native arteries of extremities with intermittent claudication, bilateral legs: Secondary | ICD-10-CM | POA: Diagnosis not present

## 2023-08-09 LAB — CBC WITH DIFFERENTIAL/PLATELET
Absolute Lymphocytes: 2305 {cells}/uL (ref 850–3900)
Absolute Monocytes: 619 {cells}/uL (ref 200–950)
Basophils Absolute: 86 {cells}/uL (ref 0–200)
Basophils Relative: 1 %
Eosinophils Absolute: 129 {cells}/uL (ref 15–500)
Eosinophils Relative: 1.5 %
HCT: 44.6 % (ref 35.0–45.0)
Hemoglobin: 14.9 g/dL (ref 11.7–15.5)
MCH: 32.5 pg (ref 27.0–33.0)
MCHC: 33.4 g/dL (ref 32.0–36.0)
MCV: 97.2 fL (ref 80.0–100.0)
MPV: 11 fL (ref 7.5–12.5)
Monocytes Relative: 7.2 %
Neutro Abs: 5461 {cells}/uL (ref 1500–7800)
Neutrophils Relative %: 63.5 %
Platelets: 250 10*3/uL (ref 140–400)
RBC: 4.59 10*6/uL (ref 3.80–5.10)
RDW: 11.9 % (ref 11.0–15.0)
Total Lymphocyte: 26.8 %
WBC: 8.6 10*3/uL (ref 3.8–10.8)

## 2023-08-09 LAB — COMPLETE METABOLIC PANEL WITH GFR
AG Ratio: 1.3 (calc) (ref 1.0–2.5)
ALT: 17 U/L (ref 6–29)
AST: 22 U/L (ref 10–35)
Albumin: 3.6 g/dL (ref 3.6–5.1)
Alkaline phosphatase (APISO): 101 U/L (ref 37–153)
BUN/Creatinine Ratio: 13 (calc) (ref 6–22)
BUN: 36 mg/dL — ABNORMAL HIGH (ref 7–25)
CO2: 28 mmol/L (ref 20–32)
Calcium: 9.2 mg/dL (ref 8.6–10.4)
Chloride: 107 mmol/L (ref 98–110)
Creat: 2.81 mg/dL — ABNORMAL HIGH (ref 0.60–1.00)
Globulin: 2.8 g/dL (ref 1.9–3.7)
Glucose, Bld: 165 mg/dL — ABNORMAL HIGH (ref 65–99)
Potassium: 4.9 mmol/L (ref 3.5–5.3)
Sodium: 140 mmol/L (ref 135–146)
Total Bilirubin: 0.5 mg/dL (ref 0.2–1.2)
Total Protein: 6.4 g/dL (ref 6.1–8.1)
eGFR: 17 mL/min/{1.73_m2} — ABNORMAL LOW (ref >=60)

## 2023-08-09 LAB — URINALYSIS, ROUTINE W REFLEX MICROSCOPIC
Bacteria, UA: NONE SEEN /[HPF]
Bilirubin Urine: NEGATIVE
Hgb urine dipstick: NEGATIVE
Ketones, ur: NEGATIVE
Leukocytes,Ua: NEGATIVE
Nitrite: NEGATIVE
RBC / HPF: NONE SEEN /[HPF] (ref 0–2)
Specific Gravity, Urine: 1.027 (ref 1.001–1.035)
pH: <=5 (ref 5.0–8.0)

## 2023-08-09 LAB — LIPID PANEL
Cholesterol: 249 mg/dL — ABNORMAL HIGH (ref <200)
HDL: 57 mg/dL (ref >=50)
LDL Cholesterol (Calc): 166 mg/dL — ABNORMAL HIGH
Non-HDL Cholesterol (Calc): 192 mg/dL — ABNORMAL HIGH (ref <130)
Total CHOL/HDL Ratio: 4.4 (calc) (ref <5.0)
Triglycerides: 129 mg/dL (ref <150)

## 2023-08-09 LAB — MICROALBUMIN / CREATININE URINE RATIO
Creatinine, Urine: 118 mg/dL (ref 20–275)
Microalb Creat Ratio: 2418 mg/g{creat} — ABNORMAL HIGH (ref <30)
Microalb, Ur: 285.3 mg/dL

## 2023-08-09 LAB — TSH: TSH: 2.44 m[IU]/L (ref 0.40–4.50)

## 2023-08-09 LAB — MICROSCOPIC MESSAGE

## 2023-08-09 LAB — VITAMIN D 25 HYDROXY (VIT D DEFICIENCY, FRACTURES): Vit D, 25-Hydroxy: 27 ng/mL — ABNORMAL LOW (ref 30–100)

## 2023-08-09 LAB — HEMOGLOBIN A1C W/OUT EAG: Hgb A1c MFr Bld: 8.9 %{Hb} — ABNORMAL HIGH (ref <5.7)

## 2023-08-09 LAB — VAS US ABI WITH/WO TBI
Left ABI: 0.62
Right ABI: 0.48

## 2023-08-09 LAB — MAGNESIUM: Magnesium: 2.3 mg/dL (ref 1.5–2.5)

## 2023-08-15 ENCOUNTER — Other Ambulatory Visit (HOSPITAL_COMMUNITY): Payer: Medicare Other

## 2023-08-15 ENCOUNTER — Ambulatory Visit (INDEPENDENT_AMBULATORY_CARE_PROVIDER_SITE_OTHER): Payer: Medicare Other | Admitting: Physician Assistant

## 2023-08-15 ENCOUNTER — Encounter (HOSPITAL_COMMUNITY): Payer: Medicare Other

## 2023-08-15 VITALS — BP 164/64 | HR 74 | Temp 98.3°F | Ht 66.0 in | Wt 192.1 lb

## 2023-08-15 DIAGNOSIS — I739 Peripheral vascular disease, unspecified: Secondary | ICD-10-CM

## 2023-08-15 NOTE — Progress Notes (Unsigned)
[facility] [place] HISTORY AND PHYSICAL   History of Present Illness:  Patient is a 77 y.o. year old female who presents for evaluation of  Of PAD B LE.  She has a history of left superficial femoral artery stenting in 2012 and developed early in-stent stenosis which was treated with balloon angioplasty which ultimately failed.  Up to this point, she had been managing her claudication symptoms.  On 11/18/2021 she underwent left femoral to below-knee popliteal artery bypass graft with vein.  This was for a left ankle ulcer.  Dr. Myra Gianotti  was originally going to do an above-knee bypass, however the artery was heavily calcified at this level.  She developed distal anastomotic stenosis which required treatment on 03/09/2022.  3 months ago, she had an ultrasound that showed elevated velocities at her distal anastomosis.  She had a repeat angiogram with angioplasty left femoral-popliteal bypass graft.    She is on a statin and aspirin daily.   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)    multiple mini strokes ( brain aneurysm ).  Right side weaker.    Past Surgical History:  Procedure Laterality Date   ABDOMINAL AORTAGRAM N/A 01/04/2012   Procedure: ABDOMINAL AORTAGRAM;  Surgeon: Nada Libman, MD;  Location: [facility] CATH LAB;  Service: Cardiovascular;  Laterality: N/A;   ABDOMINAL AORTAGRAM N/A 08/15/2012   Procedure: ABDOMINAL Ronny Flurry;  Surgeon: Nada Libman, MD;  Location: [facility] CATH LAB;  Service: Cardiovascular;  Laterality: N/A;   ABDOMINAL AORTOGRAM W/LOWER EXTREMITY Left 11/17/2021   Procedure: ABDOMINAL AORTOGRAM W/LOWER EXTREMITY;  Surgeon: Nada Libman, MD;  Location: [facility] INVASIVE CV LAB;  Service: Cardiovascular;  Laterality: Left;   ABDOMINAL AORTOGRAM W/LOWER EXTREMITY N/A 03/09/2022   Procedure: ABDOMINAL AORTOGRAM W/LOWER EXTREMITY;  Surgeon: Nada Libman, MD;  Location: [facility] INVASIVE CV LAB;  Service: Cardiovascular;  Laterality: N/A;   ABDOMINAL AORTOGRAM W/LOWER EXTREMITY N/A 03/15/2023   Procedure: ABDOMINAL AORTOGRAM W/LOWER EXTREMITY;  Surgeon: Nada Libman, MD;  Location: [facility] INVASIVE CV LAB;  Service: Cardiovascular;  Laterality: N/A;   ABDOMINAL HYSTERECTOMY  1984   cataract surgery Right 10-22-2015   cataract surgery Left 11-05-2015   CESAREAN SECTION     CHOLECYSTECTOMY     COLONOSCOPY  07/2010   DENTAL SURGERY  Aug. 16, 2013   left lower    ESOPHAGOGASTRODUODENOSCOPY  07/2010   EYE SURGERY  Nov. 2014   Laser-Glaucoma   FEMORAL ARTERY STENT  05/11/11   Left superficial femoral and popliteal artery   FEMORAL-POPLITEAL BYPASS GRAFT Left 11/18/2021   Procedure:  LEFT FEMORAL-POPLITEAL ARTERY BYPASS GRAFT;  Surgeon: Nada Libman, MD;  Location: [facility] OR;  Service: Vascular;  Laterality: Left;   HERNIA REPAIR     times two   KNEE SURGERY     LOWER EXTREMITY ANGIOGRAM Left 08/15/2012   Procedure: LOWER EXTREMITY ANGIOGRAM;  Surgeon: Nada Libman, MD;  Location: [facility] CATH LAB;  Service: Cardiovascular;  Laterality: Left;  lt leg angio   PERIPHERAL VASCULAR BALLOON ANGIOPLASTY Left 03/09/2022   Procedure: PERIPHERAL VASCULAR BALLOON ANGIOPLASTY;  Surgeon: Nada Libman, MD;  Location: [facility] INVASIVE CV LAB;  Service: Cardiovascular;  Laterality:  Left;   PERIPHERAL VASCULAR BALLOON ANGIOPLASTY Left 03/15/2023   Procedure: PERIPHERAL VASCULAR BALLOON ANGIOPLASTY;  Surgeon: Nada Libman, MD;  Location: [facility] INVASIVE CV LAB;  Service: Cardiovascular;  Laterality: Left;   rotator cuff surgery     THYROID SURGERY      ROS:   General:  No weight loss, Fever, chills  HEENT: No recent headaches, no nasal bleeding, no visual changes, no sore throat  Neurologic: No dizziness, blackouts, seizures. No recent symptoms of stroke or mini- stroke. No recent episodes of slurred speech, or temporary blindness.  Cardiac: No recent episodes of chest pain/pressure, no shortness of breath at rest.  No shortness of breath with exertion.  Denies history of atrial fibrillation or irregular heartbeat  Vascular: No history of rest pain in feet.  No history of claudication.  No history of non-healing ulcer, No history of DVT   Pulmonary: No home oxygen, no productive cough, no hemoptysis,  No asthma or wheezing  Musculoskeletal:  [ ]  Arthritis, [ ]  Low back pain,  [ ]  Joint pain  Hematologic:No history of hypercoagulable state.  No history of easy bleeding.  No history of anemia  Gastrointestinal: No hematochezia or melena,  No gastroesophageal reflux, no trouble swallowing  Urinary: [ ]  chronic Kidney disease, [ ]  on HD - [ ]  MWF or [ ]  TTHS, [ ]  Burning with urination, [ ]  Frequent urination, [ ]  Difficulty urinating;   Skin: No rashes  Psychological: No history of anxiety,  No history of depression  Social History Social History   Tobacco Use   Smoking status: Light Smoker    Current packs/day: 0.40    Average packs/day: 0.4 packs/day for 40.0 years (16.0 ttl pk-yrs)    Types: Cigarettes    Passive exposure: Never   Smokeless tobacco: Never   Tobacco comments:    11/18/20 in process of quiting    03/31/23 3 cigarettes a day   Vaping Use   Vaping status: Never Used  Substance Use Topics   Alcohol use: No    Alcohol/week: 0.0 standard  drinks of alcohol   Drug use: Never    Family History Family History  Problem Relation Age of Onset   CAD Mother 80       Died of MI   Hypertension Mother    Heart attack Mother    Heart disease Mother    CAD Father 17       Died of MI   Heart disease Father    Heart attack Father    CAD Brother 85       Two brothers died of MI   Heart attack Brother    Heart disease Brother        Amputation   Diabetes Sister    Hypertension Sister    Heart attack Brother    Heart disease Brother    Stroke Sister  Colon cancer Neg Hx    Stomach cancer Neg Hx    Esophageal cancer Neg Hx     Allergies  Allergies  Allergen Reactions   Ciprofloxacin Swelling   Ozempic (0.25 Or 0.5 Mg-Dose) [Semaglutide(0.25 Or 0.5mg -Dos)] Nausea And Vomiting   Plavix [Clopidogrel Bisulfate] Palpitations   Amoxicillin Itching and Swelling    FACE & EYES SWELL   Ace Inhibitors Cough    Sore throat   Atorvastatin     myalgias   Lyrica [Pregabalin] Itching and Nausea Only     gain weight   Penicillins Other (See Comments)    Pt unsure if there is an allergic reaction      Current Outpatient Medications  Medication Sig Dispense Refill   acetaminophen (TYLENOL) 500 MG tablet Take 1,000 mg by mouth every 6 (six) hours as needed for mild pain.     amLODipine (NORVASC) 10 MG tablet TAKE 1 TABLET BY MOUTH EVERY DAY FOR BLOOD PRESSURE 90 tablet 3   aspirin 325 MG EC tablet Take 325 mg by mouth at bedtime.     Blood Glucose Monitoring Suppl (ONETOUCH VERIO FLEX SYSTEM) w/Device KIT USE AS ADVISED 1 kit 0   busPIRone (BUSPAR) 5 MG tablet TAKE 1 TABLET BY MOUTH THREE TIMES A DAY 270 tablet 1   dapagliflozin propanediol (FARXIGA) 10 MG TABS tablet Take 1 tablet (10 mg total) by mouth daily. 90 tablet 3   Fluticasone-Umeclidin-Vilant (TRELEGY ELLIPTA) 200-62.5-25 MCG/ACT AEPB Inhale 1 puff into the lungs daily. 3 each 3   glucose blood test strip Use as instructed 3x a day - One Touch Verio 300 each 3    insulin aspart (NOVOLOG FLEXPEN) 100 UNIT/ML FlexPen Inject 10-14 Units into the skin 3 (three) times daily before meals. (Patient taking differently: Inject 14 Units into the skin 2 (two) times daily before a meal.) 45 mL 3   insulin degludec (TRESIBA FLEXTOUCH) 200 UNIT/ML FlexTouch Pen Inject 40 Units into the skin daily. 18 mL 3   meclizine (ANTIVERT) 25 MG tablet 1/2 tab up to 3 times daily for motion sickness/dizziness. (Patient taking differently: Take 12.5 mg by mouth 3 (three) times daily as needed for dizziness. 1/2 tab up to 3 times daily for motion sickness/dizziness.) 30 tablet 0   metroNIDAZOLE (FLAGYL) 500 MG tablet Take 1 tablet (500 mg total) by mouth 2 (two) times daily. 14 tablet 0   olmesartan (BENICAR) 40 MG tablet Take 1 tablet (40 mg total) by mouth daily. for blood pressure 90 tablet 3   Omega-3 Fatty Acids (FISH OIL) 1000 MG CAPS Take 1,000 mg by mouth in the morning and at bedtime.     OneTouch Delica Lancets 33G MISC Use 3x a day 200 each 3   Polyethyl Glycol-Propyl Glycol (SYSTANE ULTRA OP) Place 1 drop into both eyes daily at 12 noon. Additional drop if needed for dry eyes     Potassium 99 MG TABS Take 1 tablet by mouth daily.     sulfamethoxazole-trimethoprim (BACTRIM) 400-80 MG tablet Take 1 tablet by mouth 2 (two) times daily for 7 days. 14 tablet 0   No current facility-administered medications for this visit.    Physical Examination  Vitals:   08/15/23 1451 08/15/23 1455  BP: (!) 146/60 (!) 164/64  Pulse: 74   Temp: 98.3 F (36.8 C)   TempSrc: Temporal   SpO2: 93%   Weight: 192 lb 1.6 oz (87.1 kg)   Height: 5\' 6"  (1.676 m)     Body mass index  is 31.01 kg/m.  General:  Alert and oriented, no acute distress HEENT: Normal Neck: No bruit or JVD Pulmonary: Clear to auscultation bilaterally Cardiac: Regular Rate and Rhythm without murmur Abdomen: Soft, non-tender, non-distended, no mass, no scars Skin: No rash Extremity Pulses:   Brisk left DP and PT  by Doppler  Musculoskeletal: No deformity or edema, right hip pain and knee pain.  Neurologic: Upper and lower extremity motor 5/5 and symmetric  DATA:  Right Carotid Findings:  +----------+--------+--------+--------+------------------+---------+           PSV cm/sEDV cm/sStenosisPlaque DescriptionComments   +----------+--------+--------+--------+------------------+---------+  CCA Prox  276     19      >50%    heterogenous                 +----------+--------+--------+--------+------------------+---------+  CCA Mid   98      16              heterogenous                 +----------+--------+--------+--------+------------------+---------+  CCA Distal95      17                                           +----------+--------+--------+--------+------------------+---------+  ICA Prox  193     42      40-59%                    Shadowing  +----------+--------+--------+--------+------------------+---------+  ICA Mid   152     31                                           +----------+--------+--------+--------+------------------+---------+  ICA Distal112     23                                           +----------+--------+--------+--------+------------------+---------+  ECA      138                                                  +----------+--------+--------+--------+------------------+---------+   +----------+--------+-------+---------+-------------------+           PSV cm/sEDV cmsDescribe Arm Pressure (mmHG)  +----------+--------+-------+---------+-------------------+  Subclavian230           Turbulent190                  +----------+--------+-------+---------+-------------------+   +---------+--------+--+--------+--+---------+  VertebralPSV cm/s39EDV cm/s11Antegrade  +---------+--------+--+--------+--+---------+      Left Carotid Findings:  +----------+--------+--------+--------+------------------+--------+            PSV cm/sEDV cm/sStenosisPlaque DescriptionComments  +----------+--------+--------+--------+------------------+--------+  CCA Prox  71      11                                          +----------+--------+--------+--------+------------------+--------+  CCA Mid   90      17  calcific                    +----------+--------+--------+--------+------------------+--------+  CCA Distal554     69      >50%    calcific                    +----------+--------+--------+--------+------------------+--------+  ICA Prox  32      9       1-39%                               +----------+--------+--------+--------+------------------+--------+  ICA Mid   56      15                                          +----------+--------+--------+--------+------------------+--------+  ICA Distal68      19                                          +----------+--------+--------+--------+------------------+--------+  ECA      161                                                 +----------+--------+--------+--------+------------------+--------+   +----------+--------+--------+----------------+-------------------+           PSV cm/sEDV cm/sDescribe        Arm Pressure (mmHG)  +----------+--------+--------+----------------+-------------------+  [id number]            Multiphasic, VFI433                  +----------+--------+--------+----------------+-------------------+   +---------+--------+--+--------+--+---------+  VertebralPSV cm/s82EDV cm/s14Antegrade  +---------+--------+--+--------+--+---------+         Summary:  Right Carotid: Velocities in the right ICA are consistent with a 40-59%                 stenosis. Hemodynamically significant plaque >50%  visualized in                 the proximal CCA.   Left Carotid: Velocities in the left ICA are consistent with a 1-39%  stenosis.                 Hemodynamically  significant plaque >50% visualized in the                mid-distal CCA.   Vertebrals:  Bilateral vertebral arteries demonstrate antegrade flow.  Subclavians: Right subclavian artery flow was disturbed. Normal flow               hemodynamics were seen in the left subclavian artery.   Left Graft #1: femoropopliteal  +--------------------+--------+---------------+--------+----------------+                     PSV cm/sStenosis       WaveformComments          +--------------------+--------+---------------+--------+----------------+  Inflow             355     50-74% stenosis                          +--------------------+--------+---------------+--------+----------------+  Proximal  Anastomosis106                                              +--------------------+--------+---------------+--------+----------------+  Proximal Graft      132                                              +--------------------+--------+---------------+--------+----------------+  Mid Graft           51                                               +--------------------+--------+---------------+--------+----------------+  Distal Graft        441     >70% stenosis                            +--------------------+--------+---------------+--------+----------------+  Distal Anastomosis  180     50-70% stenosis        low end of range  +--------------------+--------+---------------+--------+----------------+  Outflow            92                                               +--------------------+--------+---------------+--------+----------------+          Summary:  Left: Patent graft with inflow stensosi 50-74%.  Distal graft >70%  Distal anastomosis 50-70%.   ABI Findings:  +---------+------------------+-----+----------+--------+  Right   Rt Pressure (mmHg)IndexWaveform  Comment   +---------+------------------+-----+----------+--------+  Brachial 190                                         +---------+------------------+-----+----------+--------+  PTA     92                0.48 monophasic          +---------+------------------+-----+----------+--------+  DP      88                0.46 monophasic          +---------+------------------+-----+----------+--------+  Great Toe54                0.28                     +---------+------------------+-----+----------+--------+   +---------+------------------+-----+--------+-------+  Left    Lt Pressure (mmHg)IndexWaveformComment  +---------+------------------+-----+--------+-------+  Brachial 191                                     +---------+------------------+-----+--------+-------+  PTA     114               0.60 biphasic         +---------+------------------+-----+--------+-------+  DP      119               0.62 biphasic         +---------+------------------+-----+--------+-------+  Great Toe74                0.39                  +---------+------------------+-----+--------+-------+   +-------+-----------+-----------+------------+------------+  ABI/TBIToday's ABIToday's TBIPrevious ABIPrevious TBI  +-------+-----------+-----------+------------+------------+  Right 0.48       0.28       0.47        0.26          +-------+-----------+-----------+------------+------------+  Left  0.62       0.39       0.82        0.40          +-------+-----------+-----------+------------+------------+    Right ABIs appear essentially unchanged. Left ABIs appear decreased  compared to prior study on 04/18/2023.    Summary:  Right: Resting right ankle-brachial index indicates severe right lower  extremity arterial disease. The right toe-brachial index is abnormal.   Left: Resting left ankle-brachial index indicates moderate left lower  extremity arterial disease. The left toe-brachial index is abnormal.    ASSESSMENT/PLAN:        Mosetta Pigeon PA-C [facility] [place] Office: [phone]  MD on call Hetty Blend

## 2023-08-16 ENCOUNTER — Other Ambulatory Visit: Payer: Self-pay

## 2023-08-16 ENCOUNTER — Encounter: Payer: Self-pay | Admitting: Physician Assistant

## 2023-08-16 DIAGNOSIS — I6523 Occlusion and stenosis of bilateral carotid arteries: Secondary | ICD-10-CM

## 2023-08-25 ENCOUNTER — Encounter: Payer: Self-pay | Admitting: Nurse Practitioner

## 2023-08-29 DIAGNOSIS — N184 Chronic kidney disease, stage 4 (severe): Secondary | ICD-10-CM | POA: Diagnosis not present

## 2023-09-07 DIAGNOSIS — I129 Hypertensive chronic kidney disease with stage 1 through stage 4 chronic kidney disease, or unspecified chronic kidney disease: Secondary | ICD-10-CM | POA: Diagnosis not present

## 2023-09-07 DIAGNOSIS — N184 Chronic kidney disease, stage 4 (severe): Secondary | ICD-10-CM | POA: Diagnosis not present

## 2023-09-07 DIAGNOSIS — R609 Edema, unspecified: Secondary | ICD-10-CM | POA: Diagnosis not present

## 2023-09-07 DIAGNOSIS — N189 Chronic kidney disease, unspecified: Secondary | ICD-10-CM | POA: Diagnosis not present

## 2023-09-07 DIAGNOSIS — N2581 Secondary hyperparathyroidism of renal origin: Secondary | ICD-10-CM | POA: Diagnosis not present

## 2023-09-07 DIAGNOSIS — D631 Anemia in chronic kidney disease: Secondary | ICD-10-CM | POA: Diagnosis not present

## 2023-09-07 DIAGNOSIS — E875 Hyperkalemia: Secondary | ICD-10-CM | POA: Diagnosis not present

## 2023-09-12 DIAGNOSIS — Z1231 Encounter for screening mammogram for malignant neoplasm of breast: Secondary | ICD-10-CM | POA: Diagnosis not present

## 2023-09-19 ENCOUNTER — Ambulatory Visit (INDEPENDENT_AMBULATORY_CARE_PROVIDER_SITE_OTHER): Payer: Medicare Other | Admitting: Nurse Practitioner

## 2023-09-19 VITALS — BP 141/52 | HR 66 | Wt 192.0 lb

## 2023-09-19 DIAGNOSIS — E1159 Type 2 diabetes mellitus with other circulatory complications: Secondary | ICD-10-CM | POA: Diagnosis not present

## 2023-09-19 DIAGNOSIS — I779 Disorder of arteries and arterioles, unspecified: Secondary | ICD-10-CM

## 2023-09-19 DIAGNOSIS — E1122 Type 2 diabetes mellitus with diabetic chronic kidney disease: Secondary | ICD-10-CM | POA: Diagnosis not present

## 2023-09-19 DIAGNOSIS — N184 Chronic kidney disease, stage 4 (severe): Secondary | ICD-10-CM

## 2023-09-19 DIAGNOSIS — Z794 Long term (current) use of insulin: Secondary | ICD-10-CM | POA: Diagnosis not present

## 2023-09-19 DIAGNOSIS — G8929 Other chronic pain: Secondary | ICD-10-CM

## 2023-09-19 DIAGNOSIS — F419 Anxiety disorder, unspecified: Secondary | ICD-10-CM

## 2023-09-19 DIAGNOSIS — Z8673 Personal history of transient ischemic attack (TIA), and cerebral infarction without residual deficits: Secondary | ICD-10-CM

## 2023-09-19 DIAGNOSIS — R1031 Right lower quadrant pain: Secondary | ICD-10-CM

## 2023-09-19 DIAGNOSIS — I10 Essential (primary) hypertension: Secondary | ICD-10-CM

## 2023-09-19 DIAGNOSIS — M2550 Pain in unspecified joint: Secondary | ICD-10-CM

## 2023-09-19 DIAGNOSIS — F17200 Nicotine dependence, unspecified, uncomplicated: Secondary | ICD-10-CM

## 2023-09-19 DIAGNOSIS — F32A Depression, unspecified: Secondary | ICD-10-CM

## 2023-09-19 NOTE — Assessment & Plan Note (Addendum)
Stated that her recent UA at the nephrologist office  was negative for UTI  Will obtain an abdominal CT Her pain could also be related to her arthritis/PAD

## 2023-09-19 NOTE — Progress Notes (Addendum)
New Patient Office Visit  Subjective:  Patient ID: [name], female    DOB: [birth year]  Age: 77 y.o. MRN: [id number]  CC:  Chief Complaint  Patient presents with   Establish Care    HPI [name] is a 77 y.o. female  has a past medical history of Abnormal findings on esophagogastroduodenoscopy (EGD) (07/2010), Allergy, Aneurysm (HCC) (2003), Anxiety, Arthritis, Cataract, CKD (chronic kidney disease), stage IV (HCC), Colon polyps, Depression, Diabetes mellitus (1998), Diverticulosis, Emphysema of lung (HCC), ESOPHAGEAL STRICTURE (08/27/2008), Family history of adverse reaction to anesthesia, GERD (gastroesophageal reflux disease), Glaucoma, Hemorrhoids, Hiatal hernia, Hyperlipidemia, Hypertension (2000), Migraines, Neuropathy, Peripheral vascular disease (HCC), Phlebitis, and Stroke (HCC).  Patient presents to establish care for her chronic medical conditions.  Previous PCP was at [facility], last visit with them was on 08/08/2023.  Abdominal pain patient was recently treated for diverticulitis with Flagyl and Bactrim, she continued to have pain in her right lower abdomen area, this has been ongoing for about 4 months she denies fever nausea, vomiting, she is wondering if her abdominal pain is due to her chronic hip pain. Stated that she had a cholecystectomy many years ago.  Arthritis she has chronic hip pain, knee pain, feet pain, has upcoming appointment with podiatrist and orthopedics.    Emphysema /tobacco use disorder, smokes about 3 cigarettes daily.  Was recently started on Trelegy, followed by pulmonology.  Stated that her doctors have always told her to quit smoking.  Type 2 diabetes.  She is on sliding scale NovoLog and Tresiba 40 units daily, Farxiga 10 mg daily she is followed by endocrinologist.  Currently not on a statin, stated that she stopped taking her cholesterol medication months ago but is willing to restart the  medication.  CKD /hypertension .she is followed by nephrologist for her CKD, takes amlodipine 10 mg daily, olmesartan 40 mg daily.  History of CVA, stated that she has had 3 strokes in the past, she is not on a statin or a blood thinner, has some weakness in her LUE and LLE, sometimes uses a walker.   Anxiety and depression.  Due to her chronic medical conditions and also having to take care of her husband, also due to her workload at work.  She is taking buspirone 5 mg at bedtime instead of 3 times daily.  She denies SI, HI  PAD.  She has had multiple intervention including multiple angiogram with angioplasty, abdominal aortogram , she has also had a left femoral bypass in 2023 .  She is established with vascular, she is not taking aspirin also not taking statin.    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,  "small"   Anxiety    Arthritis    Cataract    CKD (chronic kidney disease), stage IV (HCC)    followed by [facility]   Colon polyps    Depression    Diabetes mellitus 1998   Diverticulosis    Emphysema of lung (HCC)    "no one has evry told me that"   ESOPHAGEAL STRICTURE 08/27/2008   Family history of adverse reaction to anesthesia    daughter n/v   GERD (gastroesophageal reflux disease)    Glaucoma    Hemorrhoids    Hiatal hernia    Hyperlipidemia    Hypertension 2000   Migraines  Neuropathy    fingers and toe   Peripheral vascular disease (HCC)    Phlebitis    30 years ago  left leg   Stroke [facility])    multiple mini strokes ( brain aneurysm ).  Right side weaker.    Past Surgical History:  Procedure Laterality Date   ABDOMINAL AORTAGRAM N/A 01/04/2012   Procedure: ABDOMINAL AORTAGRAM;  Surgeon: Nada Libman, MD;  Location: [facility] CATH LAB;  Service: Cardiovascular;  Laterality: N/A;   ABDOMINAL AORTAGRAM N/A 08/15/2012   Procedure: ABDOMINAL  Ronny Flurry;  Surgeon: Nada Libman, MD;  Location: [facility] CATH LAB;  Service: Cardiovascular;  Laterality: N/A;   ABDOMINAL AORTOGRAM W/LOWER EXTREMITY Left 11/17/2021   Procedure: ABDOMINAL AORTOGRAM W/LOWER EXTREMITY;  Surgeon: Nada Libman, MD;  Location: [facility] INVASIVE CV LAB;  Service: Cardiovascular;  Laterality: Left;   ABDOMINAL AORTOGRAM W/LOWER EXTREMITY N/A 03/09/2022   Procedure: ABDOMINAL AORTOGRAM W/LOWER EXTREMITY;  Surgeon: Nada Libman, MD;  Location: [facility] INVASIVE CV LAB;  Service: Cardiovascular;  Laterality: N/A;   ABDOMINAL AORTOGRAM W/LOWER EXTREMITY N/A 03/15/2023   Procedure: ABDOMINAL AORTOGRAM W/LOWER EXTREMITY;  Surgeon: Nada Libman, MD;  Location: [facility] INVASIVE CV LAB;  Service: Cardiovascular;  Laterality: N/A;   ABDOMINAL HYSTERECTOMY  1984   BREAST SURGERY     cataract surgery Right 10/22/2015   cataract surgery Left 11/05/2015   CESAREAN SECTION     CHOLECYSTECTOMY     COLONOSCOPY  07/2010   DENTAL SURGERY  05/05/2012   left lower    ESOPHAGOGASTRODUODENOSCOPY  07/2010   EYE SURGERY  07/2013   Laser-Glaucoma   FEMORAL ARTERY STENT  05/11/2011   Left superficial femoral and popliteal artery   FEMORAL-POPLITEAL BYPASS GRAFT Left 11/18/2021   Procedure: LEFT FEMORAL-POPLITEAL ARTERY BYPASS GRAFT;  Surgeon: Nada Libman, MD;  Location: [facility] OR;  Service: Vascular;  Laterality: Left;   HERNIA REPAIR     times two   KNEE SURGERY     LOWER EXTREMITY ANGIOGRAM Left 08/15/2012   Procedure: LOWER EXTREMITY ANGIOGRAM;  Surgeon: Nada Libman, MD;  Location: [facility] CATH LAB;  Service: Cardiovascular;  Laterality: Left;  lt leg angio   PERIPHERAL VASCULAR BALLOON ANGIOPLASTY Left 03/09/2022   Procedure: PERIPHERAL VASCULAR BALLOON ANGIOPLASTY;  Surgeon: Nada Libman, MD;  Location: [facility] INVASIVE CV LAB;  Service: Cardiovascular;  Laterality: Left;   PERIPHERAL VASCULAR BALLOON ANGIOPLASTY Left 03/15/2023   Procedure: PERIPHERAL VASCULAR BALLOON ANGIOPLASTY;   Surgeon: Nada Libman, MD;  Location: [facility] INVASIVE CV LAB;  Service: Cardiovascular;  Laterality: Left;   rotator cuff surgery     THYROID SURGERY      Family History  Problem Relation Age of Onset   CAD Mother 74       Died of MI   Hypertension Mother    Heart attack Mother    Heart disease Mother    CAD Father 13       Died of MI   Heart disease Father    Heart attack Father    CAD Brother 54       Two brothers died of MI   Heart attack Brother    Heart disease Brother        Amputation   Diabetes Sister    Hypertension Sister    Heart attack Brother    Heart disease Brother    Stroke Sister    Colon cancer Neg Hx    Stomach cancer Neg Hx    Esophageal cancer Neg  Hx     Social History   Socioeconomic History   Marital status: Married    Spouse name: Not on file   Number of children: 2   Years of education: Not on file   Highest education level: 12th grade  Occupational History   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  Tobacco Use   Smoking status: Light Smoker    Current packs/day: 0.40    Average packs/day: 0.4 packs/day for 48.0 years (20.0 ttl pk-yrs)    Types: Cigarettes    Passive exposure: Never   Smokeless tobacco: Never   Tobacco comments:    11/18/20 in process of quiting    03/31/23 3 cigarettes a day   Vaping Use   Vaping status: Never Used  Substance and Sexual Activity   Alcohol use: Never   Drug use: Never   Sexual activity: Not Currently    Birth control/protection: None  Other Topics Concern   Not on file  Social History Narrative   Lives with husband.        Social Drivers of Corporate investment banker Strain: Low Risk  (09/19/2023)   Overall Financial Resource Strain (CARDIA)    Difficulty of Paying Living Expenses: Not hard at all  Food Insecurity: No Food Insecurity (09/19/2023)   Hunger Vital Sign    Worried About Running Out of Food in the Last Year: Never true    Ran Out of Food in the Last Year: Never true   Transportation Needs: No Transportation Needs (09/19/2023)   PRAPARE - Administrator, Civil Service (Medical): No    Lack of Transportation (Non-Medical): No  Physical Activity: Unknown (09/19/2023)   Exercise Vital Sign    Days of Exercise per Week: 0 days    Minutes of Exercise per Session: Not on file  Stress: Stress Concern Present (09/19/2023)   [employer] of Occupational Health - Occupational Stress Questionnaire    Feeling of Stress : Very much  Social Connections: Moderately Integrated (09/19/2023)   Social Connection and Isolation Panel [NHANES]    Frequency of Communication with Friends and Family: More than three times a week    Frequency of Social Gatherings with Friends and Family: Three times a week    Attends Religious Services: More than 4 times per year    Active Member of Clubs or Organizations: No    Attends Banker Meetings: Not on file    Marital Status: Married  Catering manager Violence: Not At Risk (08/28/2022)   Humiliation, Afraid, Rape, and Kick questionnaire    Fear of Current or Ex-Partner: No    Emotionally Abused: No    Physically Abused: No    Sexually Abused: No    ROS Review of Systems  Constitutional:  Negative for appetite change, chills, fatigue and fever.  HENT:  Negative for congestion, postnasal drip, rhinorrhea and sneezing.   Respiratory:  Negative for apnea, choking, chest tightness and wheezing.   Cardiovascular:  Negative for chest pain, palpitations and leg swelling.  Gastrointestinal:  Positive for abdominal pain. Negative for constipation, nausea and vomiting.  Genitourinary:  Negative for difficulty urinating, dysuria, flank pain and frequency.  Musculoskeletal:  Positive for arthralgias. Negative for back pain, joint swelling and myalgias.  Skin:  Negative for color change, pallor, rash and wound.  Neurological:  Positive for weakness. Negative for facial asymmetry, numbness and headaches.        Right sided from previous stroke  Psychiatric/Behavioral:  Negative  for behavioral problems, confusion, self-injury and suicidal ideas.     Objective:   Today's Vitals: BP (!) 141/52   Pulse 66   Wt 192 lb (87.1 kg)   SpO2 99%   BMI 30.99 kg/m   Physical Exam Vitals reviewed.  Constitutional:      General: She is not in acute distress.    Appearance: Normal appearance. She is not ill-appearing, toxic-appearing or diaphoretic.  Eyes:     General: No scleral icterus.       Right eye: No discharge.        Left eye: No discharge.     Extraocular Movements: Extraocular movements intact.  Cardiovascular:     Rate and Rhythm: Normal rate and regular rhythm.     Pulses: Normal pulses.     Heart sounds: Normal heart sounds. No murmur heard. Pulmonary:     Effort: Pulmonary effort is normal. No respiratory distress.     Breath sounds: Normal breath sounds. No stridor. No wheezing, rhonchi or rales.  Chest:     Chest wall: No tenderness.  Abdominal:     General: There is no distension.     Palpations: Abdomen is soft.     Tenderness: There is abdominal tenderness. There is no guarding.     Comments: Right lower quadrant  Musculoskeletal:        General: No swelling, deformity or signs of injury.     Cervical back: Normal range of motion and neck supple. No rigidity or tenderness.     Right lower leg: No edema.     Left lower leg: No edema.  Lymphadenopathy:     Cervical: No cervical adenopathy.  Skin:    General: Skin is warm and dry.     Capillary Refill: Capillary refill takes less than 2 seconds.  Neurological:     Mental Status: She is alert and oriented to person, place, and time.     Motor: No weakness.     Coordination: Coordination normal.     Gait: Gait normal.  Psychiatric:        Mood and Affect: Mood normal.        Behavior: Behavior normal.        Thought Content: Thought content normal.        Judgment: Judgment normal.     Assessment & Plan:   Problem  List Items Addressed This Visit       Cardiovascular and Mediastinum   Type 2 diabetes mellitus with circulatory disorder, with long-term current use of insulin (HCC) (Chronic)   Chronic uncontrolled condition Continue Tresiba 40 units daily, NovoLog sliding scale, Farxiga 10 mg daily Patient counseled on low-carb modified diet Encouraged to maintain close follow-up with endocrinologist        Relevant Orders   Basic Metabolic Panel (Completed)   Direct LDL (Completed)   Essential hypertension   BP Readings from Last 3 Encounters:  09/19/23 (!) 141/52  08/15/23 (!) 164/64  08/08/23 (!) 142/70  Systolic blood pressure is elevated Continue amlodipine 10 g daily, olmesartan 40 mg daily She is followed by nephrologist and cardiology Continue current medications. No changes in management.         PAOD (peripheral arterial occlusive disease) (HCC)   Currently not taking the statin that was ordered for her,  has asprin ordered but not taking the medication She is not fasting we will check a direct LDL Need to take medications as ordered discussed, Encouraged to medically follow-up with vascular specialist  Smoking cessation encouraged         Endocrine   CKD stage 4 due to type 2 diabetes mellitus (HCC)   Followed by nephrologist Continue amlodipine 10 mg daily, olmesartan 40 mg daily Would like to have BMP checked today Avoid NSAIDs and other nephrotoxic agents Lab Results  Component Value Date   NA 140 08/08/2023   K 4.9 08/08/2023   CO2 28 08/08/2023   GLUCOSE 165 (H) 08/08/2023   BUN 36 (H) 08/08/2023   CREATININE 2.81 (H) 08/08/2023   CALCIUM 9.2 08/08/2023   GFR 27.39 (L) 01/14/2020   EGFR 17 (L) 08/08/2023   GFRNONAA 21 (L) 08/30/2022          Relevant Orders   Basic Metabolic Panel (Completed)     Other   History of CVA (cerebrovascular accident)   Not on a statin or blood thinner Patient encouraged to restart statin, also needs to take aspirin 325  mg daily      Anxiety and depression   Has buspirone 5 mg 3 times daily ordered but she has been taking the medication only once daily at night.  Correct dosage of medication discussed but patient stated that she will continue to take the medication only at bedtime She denies SI, HI      Abdominal pain, chronic, right lower quadrant - Primary   Stated that her recent UA at the nephrologist office  was negative for UTI  Will obtain an abdominal CT Her pain could also be related to her arthritis/PAD      Relevant Orders   CT ABDOMEN PELVIS WO CONTRAST   CT ABDOMEN PELVIS WO CONTRAST   Tobacco use disorder   Smoking cessation encouraged      Chronic pain of multiple joints    Patient has upcoming appointment with orthopedics and podiatry She was encouraged to keep the upcoming appointments        Outpatient Encounter Medications as of 09/19/2023  Medication Sig   amLODipine (NORVASC) 10 MG tablet TAKE 1 TABLET BY MOUTH EVERY DAY FOR BLOOD PRESSURE   Blood Glucose Monitoring Suppl (ONETOUCH VERIO FLEX SYSTEM) w/Device KIT USE AS ADVISED   busPIRone (BUSPAR) 5 MG tablet TAKE 1 TABLET BY MOUTH THREE TIMES A DAY   dapagliflozin propanediol (FARXIGA) 10 MG TABS tablet Take 1 tablet (10 mg total) by mouth daily.   Fluticasone-Umeclidin-Vilant (TRELEGY ELLIPTA) 200-62.5-25 MCG/ACT AEPB Inhale 1 puff into the lungs daily.   glucose blood test strip Use as instructed 3x a day - One Touch Verio   insulin aspart (NOVOLOG FLEXPEN) 100 UNIT/ML FlexPen Inject 10-14 Units into the skin 3 (three) times daily before meals. (Patient taking differently: Inject 14 Units into the skin 2 (two) times daily before a meal.)   insulin degludec (TRESIBA FLEXTOUCH) 200 UNIT/ML FlexTouch Pen Inject 40 Units into the skin daily.   meclizine (ANTIVERT) 25 MG tablet 1/2 tab up to 3 times daily for motion sickness/dizziness. (Patient taking differently: Take 12.5 mg by mouth 3 (three) times daily as needed for  dizziness. 1/2 tab up to 3 times daily for motion sickness/dizziness.)   olmesartan (BENICAR) 40 MG tablet Take 1 tablet (40 mg total) by mouth daily. for blood pressure   Omega-3 Fatty Acids (FISH OIL) 1000 MG CAPS Take 1,000 mg by mouth in the morning and at bedtime.   OneTouch Delica Lancets 33G MISC Use 3x a day   Polyethyl Glycol-Propyl Glycol (SYSTANE ULTRA OP) Place 1 drop into both eyes daily  at 12 noon. Additional drop if needed for dry eyes   acetaminophen (TYLENOL) 500 MG tablet Take 1,000 mg by mouth every 6 (six) hours as needed for mild pain. (Patient not taking: Reported on 09/19/2023)   aspirin 325 MG EC tablet Take 325 mg by mouth at bedtime. (Patient not taking: Reported on 09/19/2023)   [DISCONTINUED] metroNIDAZOLE (FLAGYL) 500 MG tablet Take 1 tablet (500 mg total) by mouth 2 (two) times daily. (Patient not taking: Reported on 09/19/2023)   [DISCONTINUED] Potassium 99 MG TABS Take 1 tablet by mouth daily.   No facility-administered encounter medications on file as of 09/19/2023.    Follow-up: Return in about 2 months (around 11/18/2023) for HYPERLIPIDEMIA, HTN.   Donell Beers, FNP

## 2023-09-19 NOTE — Assessment & Plan Note (Addendum)
  Patient has upcoming appointment with orthopedics and podiatry She was encouraged to keep the upcoming appointments

## 2023-09-19 NOTE — Assessment & Plan Note (Addendum)
Currently not taking the statin that was ordered for her,  has asprin ordered but not taking the medication She is not fasting we will check a direct LDL Need to take medications as ordered discussed, Encouraged to medically follow-up with vascular specialist Smoking cessation encouraged

## 2023-09-19 NOTE — Patient Instructions (Signed)
    Left lower quadrant abdominal pain  - CT ABDOMEN PELVIS WO CONTRAST; Future   Type 2 diabetes mellitus with other circulatory complication, with long-term current use of insulin (HCC) (Primary)  - Basic Metabolic Panel - Direct LDL   CKD stage 4 due to type 2 diabetes mellitus (HCC)  - Basic Metabolic Panel   History of CVA (cerebrovascular accident)    Left lower quadrant abdominal pain  - CT ABDOMEN PELVIS WO CONTRAST; Future    It is important that you exercise regularly at least 30 minutes 5 times a week as tolerated  Think about what you will eat, plan ahead. Choose " clean, green, fresh or frozen" over canned, processed or packaged foods which are more sugary, salty and fatty. 70 to 75% of food eaten should be vegetables and fruit. Three meals at set times with snacks allowed between meals, but they must be fruit or vegetables. Aim to eat over a 12 hour period , example 7 am to 7 pm, and STOP after  your last meal of the day. Drink water,generally about 64 ounces per day, no other drink is as healthy. Fruit juice is best enjoyed in a healthy way, by EATING the fruit.  Thanks for choosing [facility] we consider it a privelige to serve you.

## 2023-09-19 NOTE — Assessment & Plan Note (Signed)
Smoking cessation encouraged!

## 2023-09-19 NOTE — Assessment & Plan Note (Signed)
Not on a statin or blood thinner Patient encouraged to restart statin, also needs to take aspirin 325 mg daily

## 2023-09-19 NOTE — Assessment & Plan Note (Signed)
Followed by nephrologist Continue amlodipine 10 mg daily, olmesartan 40 mg daily Would like to have BMP checked today Avoid NSAIDs and other nephrotoxic agents Lab Results  Component Value Date   NA 140 08/08/2023   K 4.9 08/08/2023   CO2 28 08/08/2023   GLUCOSE 165 (H) 08/08/2023   BUN 36 (H) 08/08/2023   CREATININE 2.81 (H) 08/08/2023   CALCIUM 9.2 08/08/2023   GFR 27.39 (L) 01/14/2020   EGFR 17 (L) 08/08/2023   GFRNONAA 21 (L) 08/30/2022

## 2023-09-19 NOTE — Assessment & Plan Note (Signed)
Chronic uncontrolled condition Continue Tresiba 40 units daily, NovoLog sliding scale, Farxiga 10 mg daily Patient counseled on low-carb modified diet Encouraged to maintain close follow-up with endocrinologist

## 2023-09-19 NOTE — Assessment & Plan Note (Signed)
Has buspirone 5 mg 3 times daily ordered but she has been taking the medication only once daily at night.  Correct dosage of medication discussed but patient stated that she will continue to take the medication only at bedtime She denies SI, HI

## 2023-09-19 NOTE — Assessment & Plan Note (Signed)
BP Readings from Last 3 Encounters:  09/19/23 (!) 141/52  08/15/23 (!) 164/64  08/08/23 (!) 142/70  Systolic blood pressure is elevated Continue amlodipine 10 g daily, olmesartan 40 mg daily She is followed by nephrologist and cardiology Continue current medications. No changes in management.

## 2023-09-20 LAB — BASIC METABOLIC PANEL
BUN/Creatinine Ratio: 13 (ref 12–28)
BUN: 41 mg/dL — ABNORMAL HIGH (ref 8–27)
CO2: 23 mmol/L (ref 20–29)
Calcium: 9.1 mg/dL (ref 8.7–10.3)
Chloride: 105 mmol/L (ref 96–106)
Creatinine, Ser: 3.04 mg/dL — ABNORMAL HIGH (ref 0.57–1.00)
Glucose: 69 mg/dL — ABNORMAL LOW (ref 70–99)
Potassium: 4.5 mmol/L (ref 3.5–5.2)
Sodium: 141 mmol/L (ref 134–144)
eGFR: 15 mL/min/{1.73_m2} — ABNORMAL LOW (ref >59)

## 2023-09-20 LAB — LDL CHOLESTEROL, DIRECT: LDL Direct: 191 mg/dL — ABNORMAL HIGH (ref 0–99)

## 2023-10-05 DIAGNOSIS — M170 Bilateral primary osteoarthritis of knee: Secondary | ICD-10-CM | POA: Diagnosis not present

## 2023-10-05 DIAGNOSIS — M47816 Spondylosis without myelopathy or radiculopathy, lumbar region: Secondary | ICD-10-CM | POA: Diagnosis not present

## 2023-10-05 DIAGNOSIS — M51369 Other intervertebral disc degeneration, lumbar region without mention of lumbar back pain or lower extremity pain: Secondary | ICD-10-CM | POA: Diagnosis not present

## 2023-10-05 DIAGNOSIS — M25551 Pain in right hip: Secondary | ICD-10-CM | POA: Diagnosis not present

## 2023-10-07 ENCOUNTER — Ambulatory Visit
Admission: RE | Admit: 2023-10-07 | Discharge: 2023-10-07 | Disposition: A | Discharge status: Home / Self Care | Payer: Medicare Other | Source: Ambulatory Visit | Attending: Nurse Practitioner | Admitting: Nurse Practitioner

## 2023-10-07 DIAGNOSIS — G8929 Other chronic pain: Secondary | ICD-10-CM

## 2023-10-10 ENCOUNTER — Other Ambulatory Visit: Payer: Self-pay | Admitting: Nurse Practitioner

## 2023-10-10 DIAGNOSIS — N184 Chronic kidney disease, stage 4 (severe): Secondary | ICD-10-CM | POA: Diagnosis not present

## 2023-10-10 MED ORDER — ROSUVASTATIN CALCIUM 20 MG PO TABS: 20.0000 mg | ORAL_TABLET | Freq: Every day | ORAL | 0 refills | Status: DC

## 2023-10-21 DIAGNOSIS — N184 Chronic kidney disease, stage 4 (severe): Secondary | ICD-10-CM | POA: Diagnosis not present

## 2023-10-21 DIAGNOSIS — N2581 Secondary hyperparathyroidism of renal origin: Secondary | ICD-10-CM | POA: Diagnosis not present

## 2023-10-21 DIAGNOSIS — N189 Chronic kidney disease, unspecified: Secondary | ICD-10-CM | POA: Diagnosis not present

## 2023-10-21 DIAGNOSIS — Z720 Tobacco use: Secondary | ICD-10-CM | POA: Diagnosis not present

## 2023-10-21 DIAGNOSIS — E1122 Type 2 diabetes mellitus with diabetic chronic kidney disease: Secondary | ICD-10-CM | POA: Diagnosis not present

## 2023-10-21 DIAGNOSIS — D631 Anemia in chronic kidney disease: Secondary | ICD-10-CM | POA: Diagnosis not present

## 2023-10-21 DIAGNOSIS — R609 Edema, unspecified: Secondary | ICD-10-CM | POA: Diagnosis not present

## 2023-10-21 DIAGNOSIS — I129 Hypertensive chronic kidney disease with stage 1 through stage 4 chronic kidney disease, or unspecified chronic kidney disease: Secondary | ICD-10-CM | POA: Diagnosis not present

## 2023-10-26 ENCOUNTER — Encounter: Payer: Self-pay | Admitting: Pulmonary Disease

## 2023-10-26 ENCOUNTER — Other Ambulatory Visit: Payer: Self-pay | Admitting: Pulmonary Disease

## 2023-10-26 DIAGNOSIS — R911 Solitary pulmonary nodule: Secondary | ICD-10-CM

## 2023-10-31 ENCOUNTER — Ambulatory Visit (INDEPENDENT_AMBULATORY_CARE_PROVIDER_SITE_OTHER): Payer: Medicare Other | Admitting: Internal Medicine

## 2023-10-31 ENCOUNTER — Encounter: Payer: Self-pay | Admitting: Internal Medicine

## 2023-10-31 VITALS — BP 140/60 | HR 80 | Ht 66.0 in | Wt 195.8 lb

## 2023-10-31 DIAGNOSIS — N184 Chronic kidney disease, stage 4 (severe): Secondary | ICD-10-CM | POA: Diagnosis not present

## 2023-10-31 DIAGNOSIS — Z794 Long term (current) use of insulin: Secondary | ICD-10-CM

## 2023-10-31 DIAGNOSIS — E1122 Type 2 diabetes mellitus with diabetic chronic kidney disease: Secondary | ICD-10-CM | POA: Diagnosis not present

## 2023-10-31 DIAGNOSIS — E66811 Obesity, class 1: Secondary | ICD-10-CM

## 2023-10-31 DIAGNOSIS — E1169 Type 2 diabetes mellitus with other specified complication: Secondary | ICD-10-CM | POA: Diagnosis not present

## 2023-10-31 DIAGNOSIS — E785 Hyperlipidemia, unspecified: Secondary | ICD-10-CM

## 2023-10-31 LAB — POCT GLYCOSYLATED HEMOGLOBIN (HGB A1C): Hemoglobin A1C: 7.3 % — AB (ref 4.0–5.6)

## 2023-10-31 MED ORDER — TIRZEPATIDE 2.5 MG/0.5ML ~~LOC~~ SOAJ: 2.5000 mg | SUBCUTANEOUS | 3 refills | Status: DC

## 2023-10-31 NOTE — Patient Instructions (Addendum)
 Please  continue: - Farxiga  10 mg in am - Tresiba  40 units daily - Novolog : 10-14 units 2x day before meals  Try to start: - Mounjaro 2.5 mg weekly - let me know in 4 weeks if we can  increase the dose   Start checking sugars more frequently later in the day.    Please return in 4 months with your sugar log.

## 2023-10-31 NOTE — Progress Notes (Signed)
 Patient ID: [name], female   DOB: 27-Dec-1945, 78 y.o.   MRN: [id number]   HPI: [name] is a 78 y.o.-year-old female, initially referred by her cardiologist, Dr.Hochrein, presenting for follow-up for DM2, dx in ~2000, insulin -dependent since ~2005, uncontrolled, with complications (PVD - s/p stents and fem-pop bypass; h/o CVA x3; CKD stage 3b; DR; PN). She saw my colleague, Dr. Washington Hacker many years ago.  Last visit with me 5 months ago. PCP: Santina Cull, PA  Interim history: No increased urination, nausea, chest pain.  She has blurry vision.  She is overwhelmed by the situation at home (husband with dementia) and also with her own medical care (especially in the setting of worsening kidney function).    Reviewed HbA1c levels: Lab Results  Component Value Date   HGBA1C 8.9 (H) 08/08/2023   HGBA1C 8.4 (A) 07/04/2023   HGBA1C 7.7 (A) 02/07/2023   HGBA1C 7.6 (H) 08/04/2022   HGBA1C 7.6 (A) 05/31/2022   HGBA1C 7.6 (A) 01/22/2022   HGBA1C 7.1 (H) 11/17/2021   HGBA1C 8.5 (H) 08/04/2021   HGBA1C 8.7 (A) 06/08/2021   HGBA1C 8.4 (H) 12/15/2020   02/07/2023: HbA1c calculated from fructosamine is 6.13%, which is excellent. 05/31/2022: HbA1c calc. From fructosamine 6.5% 01/26/2021: HbA1c calculated from fructosamine is 7.47%, higher. 09/22/2020: HbA1c calculated from fructosamine is much better than the directly measured HbA1c, at 6.25% 05/21/2020: HbA1c calculated from fructosamine 7.3%, much better than the directly measured one, and stable from before 01/14/2020: HbA1c calculated from fructosamine 7.3% 10/08/2019: HbA1c calculated from fructosamine: 6.9%  She was previously on: - Novolog  70/30 35 units before breakfast and 30 units before dinner- injecting hips and arms due to abdominal pain - Farxiga  5 mg in am - started 06/2019 >> 10 mg daily - increased 09/2019  Currently on: - Farxiga  10 mg daily before b'fast  - Tresiba  40 units daily - Novolog : 10-14 units 2x day before meals  >> 8-14 units for all meals    She could not tolerate Ozempic  (started 11/2018) due to nausea, vomiting, abdominal pain-stopped 04/2019. She was on Invokana  >> intolerance 2/2 weight loss and dehydration >> hospitalization. She was on Metformin  and sulfonylurea at diagnosis. She was also on Januvia  at the same time with Invokana  >> stopped along with Invokana .  Pt was checking sugar 1x a day (declines a CGM): - am: 68, 72-121, 144 >> 82-102 >> 79-105 >> 80-100 >> 71-121, 152 - 2h after b'fast: 326 >> n/c >> 326 (10/2019) >> n/c - before lunch: 124-172 >> n/c >> 80s-100s >> n/c - 2h after lunch: 280, 286 >> n/c >> 250 >> n/c >> 228 - before dinner: n/c >> 180 >> <140 >> 76 >> n/c - 2h after dinner: 247 >> 196- 201 >> 172 >> n/c  - bedtime: n/c >> 178 >> n/c - nighttime: 68  - seldom >> 57 (took Novolog  and skipped dinner!!!), 129 >> n/c Lowest sugar was 57 >> 82 >> 79 >> 80; she has hypoglycemia awareness at 80. Highest sugar was 335 (no medications) >> .Aaron AasAaron Aas 100s >> 119 >> 100s.  Glucometer: Livongo >> One Touch Verio  Pt's meals are: - Breakfast: eggs or cheese half a sandwich - Lunch: half a sandwich, apple - Dinner 9-9:30 pm: chicken, veggies - Snacks: stopped cheese; now apples and pickles, bedtime snack: orange or apple  She works part-time, from 10 am - 2 pm Tue-Fri.   -+ CKD stage IIIb (Dr. Zelda Hickman), last BUN/creatinine:  10/2023: GFR 16 reportedly  Lab Results  Component Value Date   BUN 41 (H) 09/19/2023   BUN 36 (H) 08/08/2023   CREATININE 3.04 (H) 09/19/2023   CREATININE 2.81 (H) 08/08/2023  09/06/2022, GFR 20 Lab Results  Component Value Date   GFRAA 32 (L) 12/15/2020   GFRAA 25 (L) 08/04/2020   GFRAA 23 (L) 07/31/2020   GFRAA 22 (L) 07/30/2020   GFRAA 31 (L) 10/18/2019  She was taken off olmesartan  40.  -+ HL; last set of lipids: Lab Results  Component Value Date   CHOL 249 (H) 08/08/2023   HDL 57 08/08/2023   LDLCALC 166 (H) 08/08/2023   LDLDIRECT 191 (H)  09/19/2023   TRIG 129 08/08/2023   CHOLHDL 4.4 08/08/2023  On Crestor   40 mg daily (restarted the above results returned, as she was off), omega-3 fatty acids 1200 mg daily.  - last eye exam was: 07/06/2023: + DR. She also has glaucoma. Dr. [place] Frasier at [facility] care.She has a h/o cataract surgery.  -+ Numbness and tingling in her feet.  She has a callus on the left foot.  She sees a podiatrist.  She did not tolerate Lyrica.  In 05/2022, she had an infected ischemic ulcer due to arterial insufficiency after injury to her left foot in 09/2021.  She had left femoropopliteal bypass graft 11/18/2021.  Afterwards, she had a balloon angioplasty >> L blood flow improved >> she got back feeling in her toes.  Last foot exam was here in clinic in 06/2023.   On ASA 81.  Pt has FH of DM in sister.  She also has a history of nontoxic multinodular goiter.  Latest TSH was reviewed and this was normal: Lab Results  Component Value Date   TSH 2.44 08/08/2023   She also has a history of HTN, brain aneurysm, emphysema, esophageal stricture, GERD.  She had hernia repair surgery- had mesh placed >> persistent abdominal pain. She started to take a supplement and vitamins in 09/2021 (by [facility] [place]) - sugars started to improve afterwards. She was admitted 12/8-07/2022 With chest pain, dizziness, shortness of breath-hypertensive urgency (sBP 200) - considered due to stress and anxiety.    Her husband's nephew, Patterson Bora, was also my patient and he died at the beginning of 02/24/2020 after he stopped going to dialysis.  ROS: + see HPI  I reviewed pt's medications, allergies, PMH, social hx, family hx, and changes were documented in the history of present illness. Otherwise, unchanged from my initial visit note.  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,  "small"   Anxiety     Arthritis    Cataract    CKD (chronic kidney disease), stage IV (HCC)    followed by [facility]   Colon polyps    Depression    Diabetes mellitus 23-Feb-1997   Diverticulosis    Emphysema of lung (HCC)    "no one has evry told me that"   ESOPHAGEAL STRICTURE 08/27/2008   Family history of adverse reaction to anesthesia    daughter n/v   GERD (gastroesophageal reflux disease)    Glaucoma    Hemorrhoids    Hiatal hernia    Hyperlipidemia    Hypertension 2000   Migraines    Neuropathy    fingers and toe   Peripheral vascular disease (HCC)    Phlebitis  30 years ago  left leg   Stroke [facility])    multiple mini strokes ( brain aneurysm ).  Right side weaker.   Past Surgical History:  Procedure Laterality Date   ABDOMINAL AORTAGRAM N/A 01/04/2012   Procedure: ABDOMINAL AORTAGRAM;  Surgeon: Margherita Shell, MD;  Location: [facility] CATH LAB;  Service: Cardiovascular;  Laterality: N/A;   ABDOMINAL AORTAGRAM N/A 08/15/2012   Procedure: ABDOMINAL Tommi Fraise;  Surgeon: Margherita Shell, MD;  Location: [facility] CATH LAB;  Service: Cardiovascular;  Laterality: N/A;   ABDOMINAL AORTOGRAM W/LOWER EXTREMITY Left 11/17/2021   Procedure: ABDOMINAL AORTOGRAM W/LOWER EXTREMITY;  Surgeon: Margherita Shell, MD;  Location: [facility] INVASIVE CV LAB;  Service: Cardiovascular;  Laterality: Left;   ABDOMINAL AORTOGRAM W/LOWER EXTREMITY N/A 03/09/2022   Procedure: ABDOMINAL AORTOGRAM W/LOWER EXTREMITY;  Surgeon: Margherita Shell, MD;  Location: [facility] INVASIVE CV LAB;  Service: Cardiovascular;  Laterality: N/A;   ABDOMINAL AORTOGRAM W/LOWER EXTREMITY N/A 03/15/2023   Procedure: ABDOMINAL AORTOGRAM W/LOWER EXTREMITY;  Surgeon: Margherita Shell, MD;  Location: [facility] INVASIVE CV LAB;  Service: Cardiovascular;  Laterality: N/A;   ABDOMINAL HYSTERECTOMY  1984   BREAST SURGERY     cataract surgery Right 10/22/2015   cataract surgery Left 11/05/2015   CESAREAN SECTION     CHOLECYSTECTOMY     COLONOSCOPY  07/2010   DENTAL SURGERY   05/05/2012   left lower    ESOPHAGOGASTRODUODENOSCOPY  07/2010   EYE SURGERY  07/2013   Laser-Glaucoma   FEMORAL ARTERY STENT  05/11/2011   Left superficial femoral and popliteal artery   FEMORAL-POPLITEAL BYPASS GRAFT Left 11/18/2021   Procedure: LEFT FEMORAL-POPLITEAL ARTERY BYPASS GRAFT;  Surgeon: Margherita Shell, MD;  Location: [facility] OR;  Service: Vascular;  Laterality: Left;   HERNIA REPAIR     times two   KNEE SURGERY     LOWER EXTREMITY ANGIOGRAM Left 08/15/2012   Procedure: LOWER EXTREMITY ANGIOGRAM;  Surgeon: Margherita Shell, MD;  Location: [facility] CATH LAB;  Service: Cardiovascular;  Laterality: Left;  lt leg angio   PERIPHERAL VASCULAR BALLOON ANGIOPLASTY Left 03/09/2022   Procedure: PERIPHERAL VASCULAR BALLOON ANGIOPLASTY;  Surgeon: Margherita Shell, MD;  Location: [facility] INVASIVE CV LAB;  Service: Cardiovascular;  Laterality: Left;   PERIPHERAL VASCULAR BALLOON ANGIOPLASTY Left 03/15/2023   Procedure: PERIPHERAL VASCULAR BALLOON ANGIOPLASTY;  Surgeon: Margherita Shell, MD;  Location: [facility] INVASIVE CV LAB;  Service: Cardiovascular;  Laterality: Left;   rotator cuff surgery     THYROID  SURGERY     Social History   Socioeconomic History   Marital status: Married    Spouse name: Not on file   Number of children: 2   Years of education: Not on file   Highest education level: 12th grade  Occupational History   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  Tobacco Use   Smoking status: Light Smoker    Current packs/day: 0.40    Average packs/day: 0.4 packs/day for 48.0 years (20.0 ttl pk-yrs)    Types: Cigarettes    Passive exposure: Never   Smokeless tobacco: Never   Tobacco comments:    11/18/20 in process of quiting    03/31/23 3 cigarettes a day   Vaping Use   Vaping status: Never Used  Substance and Sexual Activity   Alcohol  use: Never   Drug use: Never   Sexual activity: Not Currently    Birth control/protection: None  Other Topics Concern   Not on file  Social  History Narrative  Lives with husband.        Social Drivers of Corporate investment banker Strain: Low Risk  (09/19/2023)   Overall Financial Resource Strain (CARDIA)    Difficulty of Paying Living Expenses: Not hard at all  Food Insecurity: No Food Insecurity (09/19/2023)   Hunger Vital Sign    Worried About Running Out of Food in the Last Year: Never true    Ran Out of Food in the Last Year: Never true  Transportation Needs: No Transportation Needs (09/19/2023)   PRAPARE - Administrator, Civil Service (Medical): No    Lack of Transportation (Non-Medical): No  Physical Activity: Unknown (09/19/2023)   Exercise Vital Sign    Days of Exercise per Week: 0 days    Minutes of Exercise per Session: Not on file  Stress: Stress Concern Present (09/19/2023)   [employer] of Occupational Health - Occupational Stress Questionnaire    Feeling of Stress : Very much  Social Connections: Moderately Integrated (09/19/2023)   Social Connection and Isolation Panel [NHANES]    Frequency of Communication with Friends and Family: More than three times a week    Frequency of Social Gatherings with Friends and Family: Three times a week    Attends Religious Services: More than 4 times per year    Active Member of Clubs or Organizations: No    Attends Banker Meetings: Not on file    Marital Status: Married  Intimate Partner Violence: Not At Risk (08/28/2022)   Humiliation, Afraid, Rape, and Kick questionnaire    Fear of Current or Ex-Partner: No    Emotionally Abused: No    Physically Abused: No    Sexually Abused: No   Current Outpatient Medications on File Prior to Visit  Medication Sig Dispense Refill   acetaminophen  (TYLENOL ) 500 MG tablet Take 1,000 mg by mouth every 6 (six) hours as needed for mild pain. (Patient not taking: Reported on 09/19/2023)     amLODipine  (NORVASC ) 10 MG tablet TAKE 1 TABLET BY MOUTH EVERY DAY FOR BLOOD PRESSURE 90 tablet 3   aspirin   325 MG EC tablet Take 325 mg by mouth at bedtime. (Patient not taking: Reported on 09/19/2023)     Blood Glucose Monitoring Suppl (ONETOUCH VERIO FLEX SYSTEM) w/Device KIT USE AS ADVISED 1 kit 0   busPIRone  (BUSPAR ) 5 MG tablet TAKE 1 TABLET BY MOUTH THREE TIMES A DAY 270 tablet 1   dapagliflozin  propanediol (FARXIGA ) 10 MG TABS tablet Take 1 tablet (10 mg total) by mouth daily. 90 tablet 3   Fluticasone -Umeclidin-Vilant (TRELEGY ELLIPTA ) 200-62.5-25 MCG/ACT AEPB Inhale 1 puff into the lungs daily. 3 each 3   glucose blood test strip Use as instructed 3x a day - One Touch Verio 300 each 3   insulin  aspart (NOVOLOG  FLEXPEN) 100 UNIT/ML FlexPen Inject 10-14 Units into the skin 3 (three) times daily before meals. (Patient taking differently: Inject 14 Units into the skin 2 (two) times daily before a meal.) 45 mL 3   insulin  degludec (TRESIBA  FLEXTOUCH) 200 UNIT/ML FlexTouch Pen Inject 40 Units into the skin daily. 18 mL 3   meclizine  (ANTIVERT ) 25 MG tablet 1/2 tab up to 3 times daily for motion sickness/dizziness. (Patient taking differently: Take 12.5 mg by mouth 3 (three) times daily as needed for dizziness. 1/2 tab up to 3 times daily for motion sickness/dizziness.) 30 tablet 0   olmesartan  (BENICAR ) 40 MG tablet Take 1 tablet (40 mg total) by mouth daily. for blood  pressure 90 tablet 3   Omega-3 Fatty Acids (FISH OIL) 1000 MG CAPS Take 1,000 mg by mouth in the morning and at bedtime.     OneTouch Delica Lancets 33G MISC Use 3x a day 200 each 3   Polyethyl Glycol-Propyl Glycol (SYSTANE ULTRA OP) Place 1 drop into both eyes daily at 12 noon. Additional drop if needed for dry eyes     rosuvastatin  (CRESTOR ) 20 MG tablet Take 1 tablet (20 mg total) by mouth daily. 90 tablet 0   No current facility-administered medications on file prior to visit.   Allergies  Allergen Reactions   Ciprofloxacin  Swelling   Ozempic  (0.25 Or 0.5 Mg-Dose) [Semaglutide (0.25 Or 0.5mg -Dos)] Nausea And Vomiting   Plavix  [Clopidogrel Bisulfate] Palpitations   Amoxicillin Itching and Swelling    FACE & EYES SWELL   Ace Inhibitors Cough    Sore throat   Atorvastatin      myalgias   Lyrica [Pregabalin] Itching and Nausea Only     gain weight   Penicillins Other (See Comments)    Pt unsure if there is an allergic reaction    Family History  Problem Relation Age of Onset   CAD Mother 27       Died of MI   Hypertension Mother    Heart attack Mother    Heart disease Mother    CAD Father 81       Died of MI   Heart disease Father    Heart attack Father    CAD Brother 38       Two brothers died of MI   Heart attack Brother    Heart disease Brother        Amputation   Diabetes Sister    Hypertension Sister    Heart attack Brother    Heart disease Brother    Stroke Sister    Colon cancer Neg Hx    Stomach cancer Neg Hx    Esophageal cancer Neg Hx    PE: BP (!) 148/70   Pulse 80   Ht 5\' 6"  (1.676 m)   Wt 195 lb 12.8 oz (88.8 kg)   SpO2 98%   BMI 31.60 kg/m  Wt Readings from Last 10 Encounters:  10/31/23 195 lb 12.8 oz (88.8 kg)  09/19/23 192 lb (87.1 kg)  08/15/23 192 lb 1.6 oz (87.1 kg)  08/08/23 192 lb 3.2 oz (87.2 kg)  07/05/23 192 lb 12.8 oz (87.5 kg)  07/04/23 191 lb 9.6 oz (86.9 kg)  04/25/23 193 lb 6.4 oz (87.7 kg)  04/18/23 194 lb (88 kg)  03/31/23 190 lb 9.6 oz (86.5 kg)  03/15/23 189 lb (85.7 kg)   Constitutional: overweight, in NAD Eyes:  EOMI, no exophthalmos ENT: no neck masses, no cervical lymphadenopathy Cardiovascular: RRR, No MRG Respiratory: CTA Musculoskeletal: no deformities Skin:no rashes Neurological: no tremor with outstretched hands  ASSESSMENT: 1. DM2, insulin -dependent, uncontrolled, with complications - PVD - s/p stents and fem-pop bypass - h/o CVA - CKD stage 3b - DR - PN  Her antipancreatic antibodies were negative: Component     Latest Ref Rng & Units 07/22/2016  Glutamic Acid Decarb Ab     <5 IU/mL <5   2.  Hyperlipidemia  3.  Obesity  class 1  PLAN:  1. Patient with longstanding, uncontrolled, type 2 diabetes, on basal/bolus insulin  regimen and SGLT2 inhibitor.  We previously held Farxiga  due to her low GFR, at 20,  but now back on it.  At last visit,  HbA1c was 8.4%, higher and since then, she had another HbA1c <3 months ago that was even higher, at 8.9%. -At last visit, she was only checking sugars in the morning and these were at goal but she was not eating regular meals and not taking NovoLog  consistently as she was overwhelmed with a lot of responsibilities at home and with her diabetes and the rest of her medical care.  We discussed about taking more days off to just relax and also set alarms on her phone for meals and taking insulin .  I recommended to split dinner over 2-3 meals a day, to avoid slowing down her metabolism.  We did not change the regimen.  I did again recommend a CGM but she declined. -At today's visit, she tells me she was advised by her nephrologist to try to start a GLP-1 or GLP-1/GIP receptor agonist.  She is reticent to add another medication to her regimen and also due to her previous side effects to Ozempic .  After detailed discussion about available medications in this class and possible side effects and benefits, she agreed to try to start Mounjaro.  The plan is to try to increase the dose slowly, as tolerated, and hopefully to decrease the dose of NovoLog  in the near future (possibly also come off this insulin ). -There is improvement in her diabetes control judging by the HbA1c, possibly due to worsening of her kidney function.  She is still not checking sugars later in the day, she only had 1 check in the afternoon and this was elevated.  I advised her that it is mandatory to check some blood sugars later, also.  Will continue the rest of the regimen for now. - I suggested to:  Patient Instructions  Please  continue: - Farxiga  10 mg in am - Tresiba  40 units daily - Novolog : 10-14 units 2x day before  meals  Try to start: - Mounjaro 2.5 mg weekly - let me know in 4 weeks if we can  increase the dose   Start checking sugars more frequently later in the day.    Please return in 4 months with your sugar log.    - we checked her HbA1c: 7.3% (better) - advised to check sugars at different times of the day - 4x a day, rotating check times - advised for yearly eye exams >> she is UTD - return to clinic in 4 months  2. HL -Reviewed latest lipid panel from 11/ and 08/2023: LDL very elevated, otherwise fractions at goal Lab Results  Component Value Date   CHOL 249 (H) 08/08/2023   HDL 57 08/08/2023   LDLCALC 166 (H) 08/08/2023   LDLDIRECT 191 (H) 09/19/2023   TRIG 129 08/08/2023   CHOLHDL 4.4 08/08/2023  -On Crestor  40 mg daily (but was off at the time of the above results), omega-3 fatty acids 1200 mg daily-no side effects  3.  Obesity class 1 -Unfortunately, we could not continue a GLP-1 receptor agonist due to GI symptoms; at last visit, she again declined retrying it -She continues on Farxiga  which should also help with weight loss -Weight was approximately stable at the last 2 visits and gained 3 pounds since last visit  Emilie Harden, MD PhD [facility]

## 2023-11-16 DIAGNOSIS — H524 Presbyopia: Secondary | ICD-10-CM | POA: Diagnosis not present

## 2023-11-16 LAB — HM DIABETES EYE EXAM

## 2023-11-21 ENCOUNTER — Ambulatory Visit (INDEPENDENT_AMBULATORY_CARE_PROVIDER_SITE_OTHER): Payer: Self-pay | Admitting: Nurse Practitioner

## 2023-11-21 ENCOUNTER — Encounter: Payer: Self-pay | Admitting: Internal Medicine

## 2023-11-21 ENCOUNTER — Encounter: Payer: Self-pay | Admitting: Nurse Practitioner

## 2023-11-21 VITALS — BP 160/94 | HR 75 | Temp 97.4°F | Wt 196.0 lb

## 2023-11-21 DIAGNOSIS — Z794 Long term (current) use of insulin: Secondary | ICD-10-CM

## 2023-11-21 DIAGNOSIS — F419 Anxiety disorder, unspecified: Secondary | ICD-10-CM

## 2023-11-21 DIAGNOSIS — M79672 Pain in left foot: Secondary | ICD-10-CM | POA: Diagnosis not present

## 2023-11-21 DIAGNOSIS — K5903 Drug induced constipation: Secondary | ICD-10-CM | POA: Diagnosis not present

## 2023-11-21 DIAGNOSIS — E1169 Type 2 diabetes mellitus with other specified complication: Secondary | ICD-10-CM | POA: Diagnosis not present

## 2023-11-21 DIAGNOSIS — F17200 Nicotine dependence, unspecified, uncomplicated: Secondary | ICD-10-CM | POA: Diagnosis not present

## 2023-11-21 DIAGNOSIS — M79671 Pain in right foot: Secondary | ICD-10-CM | POA: Diagnosis not present

## 2023-11-21 DIAGNOSIS — E785 Hyperlipidemia, unspecified: Secondary | ICD-10-CM | POA: Diagnosis not present

## 2023-11-21 DIAGNOSIS — I10 Essential (primary) hypertension: Secondary | ICD-10-CM

## 2023-11-21 DIAGNOSIS — G8929 Other chronic pain: Secondary | ICD-10-CM | POA: Insufficient documentation

## 2023-11-21 DIAGNOSIS — I779 Disorder of arteries and arterioles, unspecified: Secondary | ICD-10-CM | POA: Diagnosis not present

## 2023-11-21 DIAGNOSIS — E1159 Type 2 diabetes mellitus with other circulatory complications: Secondary | ICD-10-CM | POA: Diagnosis not present

## 2023-11-21 DIAGNOSIS — K5900 Constipation, unspecified: Secondary | ICD-10-CM | POA: Insufficient documentation

## 2023-11-21 NOTE — Assessment & Plan Note (Signed)
 Lab Results  Component Value Date   HGBA1C 7.3 (A) 10/31/2023  Continue Mounjaro 2.5 mg once weekly, Tresiba 40 units daily, NovoLog sliding scale Avoid sugar sweets soda Encouraged to maintain close follow-up with endocrinologist

## 2023-11-21 NOTE — Assessment & Plan Note (Signed)
 Chronic uncontrolled condition Patient does not want changes to her medication today prefers to follow-up with nephrologist Continue hydralazine 25 mg 3 times daily, furosemide 40 mg daily, amlodipine 10 mg daily DASH diet and commitment to daily physical activity for a minimum of 30 minutes discussed and encouraged, as a part of hypertension management.      11/21/2023   10:54 AM 11/21/2023   10:45 AM 10/31/2023    2:50 PM 10/31/2023    2:12 PM 09/19/2023    2:00 PM 09/19/2023    1:33 PM 08/15/2023    2:55 PM  BP/Weight  Systolic BP 160 175 140 148 141 164 164  Diastolic BP 94 52 60 70 52 76 64  Wt. (Lbs)  196  195.8  192   BMI  31.64 kg/m2  31.6 kg/m2  30.99 kg/m2

## 2023-11-21 NOTE — Patient Instructions (Signed)
 For anxiety.  Lets try increasing buspirone to 5 mg twice daily  It is important that you exercise regularly at least 30 minutes 5 times a week as tolerated  Think about what you will eat, plan ahead. Choose " clean, green, fresh or frozen" over canned, processed or packaged foods which are more sugary, salty and fatty. 70 to 75% of food eaten should be vegetables and fruit. Three meals at set times with snacks allowed between meals, but they must be fruit or vegetables. Aim to eat over a 12 hour period , example 7 am to 7 pm, and STOP after  your last meal of the day. Drink water,generally about 64 ounces per day, no other drink is as healthy. Fruit juice is best enjoyed in a healthy way, by EATING the fruit.  Thanks for choosing [facility] we consider it a privelige to serve you.

## 2023-11-21 NOTE — Progress Notes (Signed)
 Established Patient Office Visit  Subjective:  Patient ID: [name], female    DOB: 1946-08-21  Age: 78 y.o. MRN: [id number]  CC:  Chief Complaint  Patient presents with   Back Pain    HPI [name] is a 78 y.o. female  has a past medical history of Abnormal findings on esophagogastroduodenoscopy (EGD) (07/2010), Allergy, Aneurysm (HCC) (2003), Anxiety, Arthritis, Cataract, CKD (chronic kidney disease), stage IV (HCC), Colon polyps, Depression, Diabetes mellitus (1998), Diverticulosis, Emphysema of lung (HCC), ESOPHAGEAL STRICTURE (08/27/2008), Family history of adverse reaction to anesthesia, GERD (gastroesophageal reflux disease), Glaucoma, Hemorrhoids, Hiatal hernia, Hyperlipidemia, Hypertension (2000), Migraines, Neuropathy, Peripheral vascular disease (HCC), Phlebitis, and Stroke (HCC).  Patient presents for follow-up for hyperlipidemia  Hyperlipidemia.  Currently on Crestor 20 mg daily, patient denies any adverse reaction to this medication  Hypertension.  Currently on amlodipine 10 mg daily, hydralazine 25 mg 3 times daily, furosemide 40 mg daily.  Patient currently denies chest pain, shortness of breath, edema.  Saw nephrology about 4 weeks ago, has an upcoming appointment with them next week  Type 2 diabetes.  Currently on Farxiga 10 mg daily, Mounjaro 2.5 mg once weekly, NovoLog sliding scale, Tresiba 40 units daily.  No complaints of polyuria polydipsia dyspnea polyphagia.  She reports some constipation since she started [facility] and was able to have a bowel movement after taking a laxative   Anxiety due to stress  she is going through with her own health, her job and her husband's health.   Takes buspirone 5 mg at bedtime instead of 3 times daily.  Patient denies SI, HI  Feet pain. Stated that she was seen podiatrist in the past and all they  would do was to trim her nail and debride porokeratotic lesion on her sole .  She has tried wearing different shoes and using  insoles that helped for a little time.    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,  "small"   Anxiety    Arthritis    Cataract    CKD (chronic kidney disease), stage IV (HCC)    followed by [facility]   Colon polyps    Depression    Diabetes mellitus 1998   Diverticulosis    Emphysema of lung (HCC)    "no one has evry told me that"   ESOPHAGEAL STRICTURE 08/27/2008   Family history of adverse reaction to anesthesia    daughter n/v   GERD (gastroesophageal reflux disease)    Glaucoma    Hemorrhoids    Hiatal hernia    Hyperlipidemia    Hypertension 2000   Migraines    Neuropathy    fingers and toe   Peripheral vascular disease (HCC)    Phlebitis    30 years ago  left leg   Stroke [facility])    multiple mini strokes ( brain aneurysm ).  Right side weaker.    Past Surgical History:  Procedure Laterality Date   ABDOMINAL AORTAGRAM N/A 01/04/2012   Procedure: ABDOMINAL AORTAGRAM;  Surgeon: Nada Libman, MD;  Location: [facility] CATH LAB;  Service: Cardiovascular;  Laterality: N/A;   ABDOMINAL AORTAGRAM N/A 08/15/2012   Procedure: ABDOMINAL Ronny Flurry;  Surgeon: Nada Libman, MD;  Location: [facility] CATH LAB;  Service: Cardiovascular;  Laterality: N/A;   ABDOMINAL AORTOGRAM W/LOWER EXTREMITY Left 11/17/2021   Procedure: ABDOMINAL AORTOGRAM W/LOWER EXTREMITY;  Surgeon: Nada Libman, MD;  Location: [facility] INVASIVE CV LAB;  Service: Cardiovascular;  Laterality: Left;   ABDOMINAL AORTOGRAM W/LOWER EXTREMITY N/A 03/09/2022   Procedure: ABDOMINAL AORTOGRAM W/LOWER EXTREMITY;  Surgeon: Nada Libman, MD;  Location: [facility] INVASIVE CV LAB;  Service: Cardiovascular;  Laterality: N/A;   ABDOMINAL AORTOGRAM W/LOWER EXTREMITY N/A 03/15/2023   Procedure: ABDOMINAL AORTOGRAM W/LOWER EXTREMITY;  Surgeon: Nada Libman, MD;  Location: [facility] INVASIVE CV LAB;  Service: Cardiovascular;   Laterality: N/A;   ABDOMINAL HYSTERECTOMY  1984   BREAST SURGERY     cataract surgery Right 10/22/2015   cataract surgery Left 11/05/2015   CESAREAN SECTION     CHOLECYSTECTOMY     COLONOSCOPY  07/2010   DENTAL SURGERY  05/05/2012   left lower    ESOPHAGOGASTRODUODENOSCOPY  07/2010   EYE SURGERY  07/2013   Laser-Glaucoma   FEMORAL ARTERY STENT  05/11/2011   Left superficial femoral and popliteal artery   FEMORAL-POPLITEAL BYPASS GRAFT Left 11/18/2021   Procedure: LEFT FEMORAL-POPLITEAL ARTERY BYPASS GRAFT;  Surgeon: Nada Libman, MD;  Location: [facility] OR;  Service: Vascular;  Laterality: Left;   HERNIA REPAIR     times two   KNEE SURGERY     LOWER EXTREMITY ANGIOGRAM Left 08/15/2012   Procedure: LOWER EXTREMITY ANGIOGRAM;  Surgeon: Nada Libman, MD;  Location: [facility] CATH LAB;  Service: Cardiovascular;  Laterality: Left;  lt leg angio   PERIPHERAL VASCULAR BALLOON ANGIOPLASTY Left 03/09/2022   Procedure: PERIPHERAL VASCULAR BALLOON ANGIOPLASTY;  Surgeon: Nada Libman, MD;  Location: [facility] INVASIVE CV LAB;  Service: Cardiovascular;  Laterality: Left;   PERIPHERAL VASCULAR BALLOON ANGIOPLASTY Left 03/15/2023   Procedure: PERIPHERAL VASCULAR BALLOON ANGIOPLASTY;  Surgeon: Nada Libman, MD;  Location: [facility] INVASIVE CV LAB;  Service: Cardiovascular;  Laterality: Left;   rotator cuff surgery     THYROID SURGERY      Family History  Problem Relation Age of Onset   CAD Mother 52       Died of MI   Hypertension Mother    Heart attack Mother    Heart disease Mother    CAD Father 59       Died of MI   Heart disease Father    Heart attack Father    CAD Brother 62       Two brothers died of MI   Heart attack Brother    Heart disease Brother        Amputation   Diabetes Sister    Hypertension Sister    Heart attack Brother    Heart disease Brother    Stroke Sister    Colon cancer Neg Hx    Stomach cancer Neg Hx    Esophageal cancer Neg Hx     Social History    Socioeconomic History   Marital status: Married    Spouse name: Not on file   Number of children: 2   Years of education: Not on file   Highest education level: 12th grade  Occupational History   Occupation: part time Teacher, 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        Social Drivers of Corporate investment banker Strain: Low Risk  (09/19/2023)   Overall Financial Resource Strain (CARDIA)    Difficulty of Paying Living Expenses: Not hard at all  Food Insecurity: No Food Insecurity (09/19/2023)   Hunger Vital Sign    Worried About Running Out of Food in the Last Year: Never true    Ran Out of Food in the Last Year: Never true  Transportation Needs: No Transportation Needs (09/19/2023)   PRAPARE - Administrator, Civil Service (Medical): No    Lack of Transportation (Non-Medical): No  Physical Activity: Unknown (09/19/2023)   Exercise Vital Sign    Days of Exercise per Week: 0 days    Minutes of Exercise per Session: Not on file  Stress: Stress Concern Present (09/19/2023)   [employer] of Occupational Health - Occupational Stress Questionnaire    Feeling of Stress : Very much  Social Connections: Moderately Integrated (09/19/2023)   Social Connection and Isolation Panel [NHANES]    Frequency of Communication with Friends and Family: More than three times a week    Frequency of Social Gatherings with Friends and  Family: Three times a week    Attends Religious Services: More than 4 times per year    Active Member of Clubs or Organizations: No    Attends Banker Meetings: Not on file    Marital Status: Married  Intimate Partner Violence: Not At Risk (08/28/2022)   Humiliation, Afraid, Rape, and Kick questionnaire    Fear of Current or Ex-Partner: No    Emotionally Abused: No    Physically Abused: No    Sexually Abused: No    Outpatient Medications Prior to Visit  Medication Sig Dispense Refill   acetaminophen (TYLENOL) 500 MG tablet Take 1,000 mg by mouth every 6 (six) hours as needed for mild pain (pain score 1-3).     amLODipine (NORVASC) 10 MG tablet TAKE 1 TABLET BY MOUTH EVERY DAY FOR BLOOD PRESSURE 90 tablet 3   aspirin 325 MG EC tablet Take 325 mg by mouth at bedtime.     Blood Glucose Monitoring Suppl (ONETOUCH VERIO FLEX SYSTEM) w/Device KIT USE AS ADVISED 1 kit 0   busPIRone (BUSPAR) 5 MG tablet TAKE 1 TABLET BY MOUTH THREE TIMES A DAY 270 tablet 1   dapagliflozin propanediol (FARXIGA) 10 MG TABS tablet Take 1 tablet (10 mg total) by mouth daily. 90 tablet 3   Fluticasone-Umeclidin-Vilant (TRELEGY ELLIPTA) 200-62.5-25 MCG/ACT AEPB Inhale 1 puff into the lungs daily. 3 each 3   furosemide (LASIX) 40 MG tablet 1 TABLET BY MOUTH TWICE A DAY, START WITH TAKING 1 TABLET ONCE A DAY     glucose blood test strip Use as instructed 3x a day - One Touch Verio 300 each 3   insulin aspart (NOVOLOG FLEXPEN) 100 UNIT/ML FlexPen Inject 10-14 Units into the skin 3 (three) times daily before meals. (Patient taking differently: Inject 14 Units into the skin 2 (two) times daily before a meal.) 45 mL 3   insulin degludec (TRESIBA FLEXTOUCH) 200 UNIT/ML FlexTouch Pen Inject 40 Units into the skin daily. 18 mL 3   meclizine (ANTIVERT) 25  MG tablet 1/2 tab up to 3 times daily for motion sickness/dizziness. (Patient taking differently: Take 12.5 mg by mouth 3 (three) times daily as needed for dizziness.  1/2 tab up to 3 times daily for motion sickness/dizziness.) 30 tablet 0   Omega-3 Fatty Acids (FISH OIL) 1000 MG CAPS Take 1,000 mg by mouth in the morning and at bedtime.     OneTouch Delica Lancets 33G MISC Use 3x a day 200 each 3   Polyethyl Glycol-Propyl Glycol (SYSTANE ULTRA OP) Place 1 drop into both eyes daily at 12 noon. Additional drop if needed for dry eyes     rosuvastatin (CRESTOR) 20 MG tablet Take 1 tablet (20 mg total) by mouth daily. 90 tablet 0   tirzepatide (MOUNJARO) 2.5 MG/0.5ML Pen Inject 2.5 mg into the skin once a week. 6 mL 3   hydrALAZINE (APRESOLINE) 25 MG tablet Take 1 tablet by mouth 3 (three) times daily.     No facility-administered medications prior to visit.    Allergies  Allergen Reactions   Ciprofloxacin Swelling   Ozempic (0.25 Or 0.5 Mg-Dose) [Semaglutide(0.25 Or 0.5mg -Dos)] Nausea And Vomiting   Plavix [Clopidogrel Bisulfate] Palpitations   Amoxicillin Itching and Swelling    FACE & EYES SWELL   Ace Inhibitors Cough    Sore throat   Atorvastatin     myalgias   Lyrica [Pregabalin] Itching and Nausea Only     gain weight   Penicillins Other (See Comments)    Pt unsure if there is an allergic reaction     ROS Review of Systems  Constitutional:  Negative for appetite change, chills, fatigue and fever.  HENT:  Negative for congestion, postnasal drip, rhinorrhea and sneezing.   Respiratory:  Negative for cough, shortness of breath and wheezing.   Cardiovascular:  Negative for chest pain, palpitations and leg swelling.  Gastrointestinal:  Positive for abdominal pain. Negative for constipation, nausea and vomiting.  Genitourinary:  Negative for difficulty urinating, dysuria, flank pain and frequency.  Musculoskeletal:  Negative for arthralgias, back pain, joint swelling and myalgias.  Skin:  Negative for color change, pallor, rash and wound.  Neurological:  Negative for dizziness, facial asymmetry, weakness, numbness and headaches.   Psychiatric/Behavioral:  Negative for behavioral problems, confusion, self-injury and suicidal ideas.       Objective:    Physical Exam Vitals and nursing note reviewed.  Constitutional:      General: She is not in acute distress.    Appearance: Normal appearance. She is obese. She is not ill-appearing, toxic-appearing or diaphoretic.  HENT:     Mouth/Throat:     Mouth: Mucous membranes are moist.     Pharynx: Oropharynx is clear. No oropharyngeal exudate or posterior oropharyngeal erythema.  Eyes:     General: No scleral icterus.       Right eye: No discharge.        Left eye: No discharge.     Extraocular Movements: Extraocular movements intact.     Conjunctiva/sclera: Conjunctivae normal.  Cardiovascular:     Rate and Rhythm: Normal rate and regular rhythm.     Pulses: Normal pulses.     Heart sounds: Normal heart sounds. No murmur heard.    No friction rub. No gallop.  Pulmonary:     Effort: Pulmonary effort is normal. No respiratory distress.     Breath sounds: Normal breath sounds. No stridor. No wheezing, rhonchi or rales.  Chest:     Chest wall: No tenderness.  Abdominal:  General: There is no distension.     Palpations: Abdomen is soft.     Tenderness: There is no abdominal tenderness. There is no right CVA tenderness, left CVA tenderness or guarding.  Musculoskeletal:        General: No swelling, deformity or signs of injury.     Right lower leg: No edema.     Left lower leg: No edema.  Skin:    General: Skin is warm and dry.     Coloration: Skin is not jaundiced or pale.     Findings: No bruising, erythema or lesion.  Neurological:     Mental Status: She is alert and oriented to person, place, and time.     Motor: No weakness.     Coordination: Coordination normal.     Gait: Gait normal.  Psychiatric:        Mood and Affect: Mood normal.        Behavior: Behavior normal.        Thought Content: Thought content normal.        Judgment: Judgment  normal.     BP (!) 160/94   Pulse 75   Temp (!) 97.4 F (36.3 C)   Wt 196 lb (88.9 kg)   SpO2 98%   BMI 31.64 kg/m  Wt Readings from Last 3 Encounters:  11/21/23 196 lb (88.9 kg)  10/31/23 195 lb 12.8 oz (88.8 kg)  09/19/23 192 lb (87.1 kg)    Lab Results  Component Value Date   TSH 2.44 08/08/2023   Lab Results  Component Value Date   WBC 8.6 08/08/2023   HGB 14.9 08/08/2023   HCT 44.6 08/08/2023   MCV 97.2 08/08/2023   PLT 250 08/08/2023   Lab Results  Component Value Date   NA 141 09/19/2023   K 4.5 09/19/2023   CO2 23 09/19/2023   GLUCOSE 69 (L) 09/19/2023   BUN 41 (H) 09/19/2023   CREATININE 3.04 (H) 09/19/2023   BILITOT 0.5 08/08/2023   ALKPHOS 122 (H) 03/31/2023   AST 22 08/08/2023   ALT 17 08/08/2023   PROT 6.4 08/08/2023   ALBUMIN 3.9 03/31/2023   CALCIUM 9.1 09/19/2023   ANIONGAP 7 08/30/2022   EGFR 15 (L) 09/19/2023   GFR 27.39 (L) 01/14/2020   Lab Results  Component Value Date   CHOL 249 (H) 08/08/2023   Lab Results  Component Value Date   HDL 57 08/08/2023   Lab Results  Component Value Date   LDLCALC 166 (H) 08/08/2023   Lab Results  Component Value Date   TRIG 129 08/08/2023   Lab Results  Component Value Date   CHOLHDL 4.4 08/08/2023   Lab Results  Component Value Date   HGBA1C 7.3 (A) 10/31/2023      Assessment & Plan:   Problem List Items Addressed This Visit       Cardiovascular and Mediastinum   Type 2 diabetes mellitus with circulatory disorder, with long-term current use of insulin (HCC) (Chronic)   Lab Results  Component Value Date   HGBA1C 7.3 (A) 10/31/2023  Continue Mounjaro 2.5 mg once weekly, Tresiba 40 units daily, NovoLog sliding scale Avoid sugar sweets soda Encouraged to maintain close follow-up with endocrinologist      Relevant Medications   hydrALAZINE (APRESOLINE) 25 MG tablet   Other Relevant Orders   Lipid panel   Essential hypertension   Chronic uncontrolled condition Patient does  not want changes to her medication today prefers to follow-up with nephrologist Continue  hydralazine 25 mg 3 times daily, furosemide 40 mg daily, amlodipine 10 mg daily DASH diet and commitment to daily physical activity for a minimum of 30 minutes discussed and encouraged, as a part of hypertension management.      11/21/2023   10:54 AM 11/21/2023   10:45 AM 10/31/2023    2:50 PM 10/31/2023    2:12 PM 09/19/2023    2:00 PM 09/19/2023    1:33 PM 08/15/2023    2:55 PM  BP/Weight  Systolic BP 160 175 140 148 141 164 164  Diastolic BP 94 52 60 70 52 76 64  Wt. 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   Smoking cessation encouraged LDL goal is less than 55, currently on Crestor 20 mg daily Checking lipid panel      Relevant Medications   hydrALAZINE (APRESOLINE) 25 MG tablet     Endocrine   Hyperlipidemia associated with type 2 diabetes mellitus (HCC)   Lab Results  Component Value Date   CHOL 249 (H) 08/08/2023   HDL 57 08/08/2023   LDLCALC 166 (H) 08/08/2023   LDLDIRECT 191 (H) 09/19/2023   TRIG 129 08/08/2023   CHOLHDL 4.4 08/08/2023  Currently on Crestor 20 mg daily, LDL goal is less than 55 Checking lipid panel Avoid fatty fried foods      Relevant Medications   hydrALAZINE (APRESOLINE) 25 MG tablet     Other   Anxiety   Currently taking buspirone 5 mg at bedtime daily but she will try taking the medication twice daily    09/19/2023    1:47 PM  GAD 7 : Generalized Anxiety Score  Nervous, Anxious, on Edge 0  Control/stop worrying 0  Worry too much - different things 0  Trouble relaxing 0  Restless 0  Easily annoyed or irritable 3  Afraid - awful might happen 0  Total GAD 7 Score 3  Anxiety Difficulty Not difficult at all          Tobacco use disorder   Need to quit smoking discussed       Chronic pain of both feet   Patient encouraged to wear soft supportive shoes daily       Constipation   Since she started Mounjaro 2 weeks ago Patient encouraged to drink at least 64 ounces of water daily to maintain hydration, increase intake of fiber and take laxative as needed       No orders of the defined types were placed in this encounter.   Follow-up: Return in about 2 months (around 01/21/2024) for HTN, HYPERLIPIDEMIA.    Donell Beers, FNP

## 2023-11-21 NOTE — Assessment & Plan Note (Signed)
 Need to quit smoking discussed

## 2023-11-21 NOTE — Assessment & Plan Note (Signed)
 Currently taking buspirone 5 mg at bedtime daily but she will try taking the medication twice daily    09/19/2023    1:47 PM  GAD 7 : Generalized Anxiety Score  Nervous, Anxious, on Edge 0  Control/stop worrying 0  Worry too much - different things 0  Trouble relaxing 0  Restless 0  Easily annoyed or irritable 3  Afraid - awful might happen 0  Total GAD 7 Score 3  Anxiety Difficulty Not difficult at all

## 2023-11-21 NOTE — Assessment & Plan Note (Signed)
 Patient encouraged to wear soft supportive shoes daily

## 2023-11-21 NOTE — Assessment & Plan Note (Signed)
 Since she started [facility] 2 weeks ago Patient encouraged to drink at least 64 ounces of water daily to maintain hydration, increase intake of fiber and take laxative as needed

## 2023-11-21 NOTE — Assessment & Plan Note (Signed)
 Lab Results  Component Value Date   CHOL 249 (H) 08/08/2023   HDL 57 08/08/2023   LDLCALC 166 (H) 08/08/2023   LDLDIRECT 191 (H) 09/19/2023   TRIG 129 08/08/2023   CHOLHDL 4.4 08/08/2023  Currently on Crestor 20 mg daily, LDL goal is less than 55 Checking lipid panel Avoid fatty fried foods

## 2023-11-21 NOTE — Assessment & Plan Note (Signed)
 Smoking cessation encouraged LDL goal is less than 55, currently on Crestor 20 mg daily Checking lipid panel

## 2023-11-21 NOTE — Assessment & Plan Note (Deleted)
 She will try taking medication twice daily

## 2023-11-22 ENCOUNTER — Encounter: Payer: Self-pay | Admitting: Pulmonary Disease

## 2023-11-22 ENCOUNTER — Ambulatory Visit: Payer: Medicare Other | Admitting: Pulmonary Disease

## 2023-11-22 VITALS — BP 148/74 | HR 85 | Ht 66.0 in | Wt 196.6 lb

## 2023-11-22 DIAGNOSIS — J439 Emphysema, unspecified: Secondary | ICD-10-CM

## 2023-11-22 DIAGNOSIS — F17210 Nicotine dependence, cigarettes, uncomplicated: Secondary | ICD-10-CM | POA: Diagnosis not present

## 2023-11-22 DIAGNOSIS — N184 Chronic kidney disease, stage 4 (severe): Secondary | ICD-10-CM | POA: Diagnosis not present

## 2023-11-22 DIAGNOSIS — R918 Other nonspecific abnormal finding of lung field: Secondary | ICD-10-CM | POA: Diagnosis not present

## 2023-11-22 DIAGNOSIS — E785 Hyperlipidemia, unspecified: Secondary | ICD-10-CM | POA: Diagnosis not present

## 2023-11-22 DIAGNOSIS — N189 Chronic kidney disease, unspecified: Secondary | ICD-10-CM

## 2023-11-22 NOTE — Progress Notes (Signed)
 [name]    [id number]    12-Jun-1946  Primary Care Physician:Paseda, Baird Kay, FNP  Referring Physician: Donell Beers, FNP [phone] S. [address] [place],  [place] [address]  Chief complaint: Consult for dyspnea  HPI: 78 y.o. who  has a past medical history of Abnormal findings on esophagogastroduodenoscopy (EGD) (07/2010), Allergy, Aneurysm (HCC) (2003), Anxiety, Arthritis, Cataract, CKD (chronic kidney disease), stage IV (HCC), Colon polyps, Depression, Diabetes mellitus (1998), Diverticulosis, Emphysema of lung (HCC), ESOPHAGEAL STRICTURE (08/27/2008), Family history of adverse reaction to anesthesia, GERD (gastroesophageal reflux disease), Glaucoma, Hemorrhoids, Hiatal hernia, Hyperlipidemia, Hypertension (2000), Migraines, Neuropathy, Peripheral vascular disease (HCC), Phlebitis, and Stroke (HCC).   Discussed the use of AI scribe software for clinical note transcription with the patient, who gave verbal consent to proceed.  [name], a 78 year old with a history of smoking, presents with shortness of breath that is exacerbated by walking. She reports that she is 'cool as a cucumber' at rest, but any physical exertion leads to breathlessness. She has a history of using an inhaler (Spiriva) which was prescribed in 2020, but discontinued due to throat soreness and coughing. She was given another inhaler a few months ago, but it also caused throat soreness and coughing. She carries it for emergencies but does not use it regularly.  In addition to the shortness of breath, she has been experiencing dizziness and blurred vision. She has a history of a four-bypass surgery on her leg and has regular check-ups every three months to monitor blood circulation. She reports that the blood does not circulate as well as the doctors would like. She is currently being evaluated for blood circulation from her neck to her brain.  She also reports high blood pressure that has been  difficult to control, with readings often above 200. She has severe anxiety, which she believes may contribute to her high blood pressure.  She has a history of asthma in childhood, but does not believe she currently has asthma. She is a light smoker, smoking about half a pack of cigarettes every day or day and a half. 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 She has been using a walker to get around due to her shortness of breath and joint pain in her knees and hips.   Pets: Cat Occupation: Used to work at a clerical job Exposures: No mold, hot tub, Financial controller.  No feather pillows or comforters Smoking history: 16-pack-year smoker.  Continues to smoke half pack per day Travel history: Originally from [place].  No significant recent travel Relevant family history: No family history of lung disease  Interim history: Discussed the use of AI scribe software for clinical note transcription with the patient, who gave verbal consent to proceed.  [name] is a 78 year old female who presents for follow-up on lung function and kidney health.  She has a lung nodule that was initially identified in 2022 and remains stable. A new small nodule has been detected, which requires observation. She uses the Trelegy inhaler, which helps with her symptoms.  She mentions a decrease in kidney function, with her levels dropping to sixteen. She is seeing her nephrologist every five weeks for monitoring.  There was a scheduling error for her CT scan, initially set for March but corrected to May, to monitor a lung nodule.  She has been checked for cat allergies, which were negative. No new lung issues.  Outpatient Encounter Medications as of 11/22/2023  Medication Sig   acetaminophen (TYLENOL) 500 MG tablet Take 1,000 mg by mouth every 6 (six) hours as needed for mild pain (pain score 1-3).   amLODipine (NORVASC) 10 MG tablet TAKE 1 TABLET BY MOUTH EVERY DAY FOR BLOOD PRESSURE   aspirin 325 MG  EC tablet Take 325 mg by mouth at bedtime.   Blood Glucose Monitoring Suppl (ONETOUCH VERIO FLEX SYSTEM) w/Device KIT USE AS ADVISED   busPIRone (BUSPAR) 5 MG tablet TAKE 1 TABLET BY MOUTH THREE TIMES A DAY   dapagliflozin propanediol (FARXIGA) 10 MG TABS tablet Take 1 tablet (10 mg total) by mouth daily.   Fluticasone-Umeclidin-Vilant (TRELEGY ELLIPTA) 200-62.5-25 MCG/ACT AEPB Inhale 1 puff into the lungs daily.   furosemide (LASIX) 40 MG tablet 1 TABLET BY MOUTH TWICE A DAY, START WITH TAKING 1 TABLET ONCE A DAY   glucose blood test strip Use as instructed 3x a day - One Touch Verio   hydrALAZINE (APRESOLINE) 25 MG tablet Take 1 tablet by mouth 3 (three) times daily.   insulin aspart (NOVOLOG FLEXPEN) 100 UNIT/ML FlexPen Inject 10-14 Units into the skin 3 (three) times daily before meals. (Patient taking differently: Inject 14 Units into the skin 2 (two) times daily before a meal.)   insulin degludec (TRESIBA FLEXTOUCH) 200 UNIT/ML FlexTouch Pen Inject 40 Units into the skin daily.   meclizine (ANTIVERT) 25 MG tablet 1/2 tab up to 3 times daily for motion sickness/dizziness. (Patient taking differently: Take 12.5 mg by mouth 3 (three) times daily as needed for dizziness. 1/2 tab up to 3 times daily for motion sickness/dizziness.)   Omega-3 Fatty Acids (FISH OIL) 1000 MG CAPS Take 1,000 mg by mouth in the morning and at bedtime.   OneTouch Delica Lancets 33G MISC Use 3x a day   Polyethyl Glycol-Propyl Glycol (SYSTANE ULTRA OP) Place 1 drop into both eyes daily at 12 noon. Additional drop if needed for dry eyes   rosuvastatin (CRESTOR) 20 MG tablet Take 1 tablet (20 mg total) by mouth daily.   tirzepatide [facility]) 2.5 MG/0.5ML Pen Inject 2.5 mg into the skin once a week.   No facility-administered encounter medications on file as of 11/22/2023.    Physical Exam: Blood pressure (!) 148/74, pulse 85, height 5\' 6"  (1.676 m), weight 196 lb 9.6 oz (89.2 kg), SpO2 97%. Gen:      No acute  distress HEENT:  EOMI, sclera anicteric Neck:     No masses; no thyromegaly Lungs:    Clear to auscultation bilaterally; normal respiratory effort CV:         Regular rate and rhythm; no murmurs Abd:      + bowel sounds; soft, non-tender; no palpable masses, no distension Ext:    No edema; adequate peripheral perfusion Skin:      Warm and dry; no rash Neuro: alert and oriented x 3 Psych: normal mood and affect   Data Reviewed: Imaging: CT chest 10/16/20 Ground glass nodule in right apex. Emphysema, coronary artery calcifications.  I have reviewed the images personally  PFTs: 01/08/2019 FVC 2.03 [64%], FEV1 1.21 [50%], F/F60, TLC 4.82 [90%], DLCO 15.30 [1 4%] Severe obstructive airway disease, minimal diffusion defect, restriction  Labs: CBC 08/08/2023-WBC 8.6, eos 1.5%, absolute eosinophil count 129 RAST panel 07/05/2023- negative  Assessment and Plan Chronic Obstructive Pulmonary Disease (COPD) Currently using Trelegy inhaler with symptomatic relief. Pulmonary function test needed to confirm COPD diagnosis and assess severity. - Schedule pulmonary function test in  six months  Lung nodule Initial lung nodule identified in 2022 remains unchanged. A new small nodule detected requires monitoring. CT scan scheduled in May to assess nodules. - Schedule CT scan in May to monitor lung nodules  Chronic kidney disease Kidney function declined to 16%. Under nephrologist Dr. Doristine Church care with evaluations every five weeks for potential dialysis access. - Continue follow-up with nephrologist Dr. Thedore Mins every five weeks  Hyperlipidemia Cholesterol levels monitored by primary care physician. Recent blood work conducted to assess cholesterol levels. - Await results of recent cholesterol blood work   Recommendations: Trelegy CT chest Repeat PFTs in 6 months  Chilton Greathouse MD [facility] 11/22/2023, 3:10 PM  CC: Donell Beers, FNP

## 2023-11-22 NOTE — Patient Instructions (Signed)
 VISIT SUMMARY:  Today, we discussed your lung function and kidney health. Your lung nodule from 2022 remains stable, but a new small nodule has been detected and will be monitored. You are using the Trelegy inhaler, which helps with your symptoms. We also talked about your decreased kidney function, and you will continue to see your nephrologist every five weeks. There was a scheduling error for your CT scan, but it has been corrected to May. Your cat allergy test was negative, and there are no new lung issues.  YOUR PLAN:  -CHRONIC OBSTRUCTIVE PULMONARY DISEASE (COPD): COPD is a chronic lung disease that makes it hard to breathe. You are currently using the Trelegy inhaler, which helps with your symptoms. We will schedule a pulmonary function test in six months to confirm the diagnosis and assess its severity.  -LUNG NODULE: A lung nodule is a small growth in the lung. Your initial lung nodule from 2022 remains unchanged, but a new small nodule has been detected. We will monitor these nodules with a CT scan scheduled in May.  -CHRONIC KIDNEY DISEASE: Chronic kidney disease means your kidneys are not working as well as they should. Your kidney function has declined to 16%, and you are under the care of your nephrologist, Dr. Thedore Mins, with evaluations every five weeks to monitor for potential dialysis.  -HYPERLIPIDEMIA: Hyperlipidemia means you have high cholesterol levels. Your cholesterol levels are being monitored by your primary care physician, and we are awaiting the results of your recent blood work.  INSTRUCTIONS:  1. Schedule a pulmonary function test in six months. 2. Attend your CT scan in May to monitor lung nodules. 3. Continue follow-up with your nephrologist, Dr. Thedore Mins, every five weeks. 4. Await the results of your recent cholesterol blood work.

## 2023-11-23 ENCOUNTER — Other Ambulatory Visit: Payer: Medicare Other

## 2023-11-30 DIAGNOSIS — N184 Chronic kidney disease, stage 4 (severe): Secondary | ICD-10-CM | POA: Diagnosis not present

## 2023-11-30 DIAGNOSIS — R252 Cramp and spasm: Secondary | ICD-10-CM | POA: Diagnosis not present

## 2023-11-30 DIAGNOSIS — N189 Chronic kidney disease, unspecified: Secondary | ICD-10-CM | POA: Diagnosis not present

## 2023-11-30 DIAGNOSIS — D631 Anemia in chronic kidney disease: Secondary | ICD-10-CM | POA: Diagnosis not present

## 2023-11-30 DIAGNOSIS — I129 Hypertensive chronic kidney disease with stage 1 through stage 4 chronic kidney disease, or unspecified chronic kidney disease: Secondary | ICD-10-CM | POA: Diagnosis not present

## 2023-11-30 DIAGNOSIS — E1122 Type 2 diabetes mellitus with diabetic chronic kidney disease: Secondary | ICD-10-CM | POA: Diagnosis not present

## 2023-11-30 DIAGNOSIS — Z8673 Personal history of transient ischemic attack (TIA), and cerebral infarction without residual deficits: Secondary | ICD-10-CM | POA: Diagnosis not present

## 2023-11-30 DIAGNOSIS — N2581 Secondary hyperparathyroidism of renal origin: Secondary | ICD-10-CM | POA: Diagnosis not present

## 2023-11-30 DIAGNOSIS — R609 Edema, unspecified: Secondary | ICD-10-CM | POA: Diagnosis not present

## 2023-12-01 ENCOUNTER — Other Ambulatory Visit: Payer: Self-pay

## 2023-12-01 DIAGNOSIS — I70219 Atherosclerosis of native arteries of extremities with intermittent claudication, unspecified extremity: Secondary | ICD-10-CM

## 2023-12-12 ENCOUNTER — Encounter: Payer: Self-pay | Admitting: Surgery

## 2023-12-12 ENCOUNTER — Ambulatory Visit (INDEPENDENT_AMBULATORY_CARE_PROVIDER_SITE_OTHER)
Admission: RE | Admit: 2023-12-12 | Discharge: 2023-12-12 | Disposition: A | Discharge status: Home / Self Care | Payer: Medicare Other | Source: Ambulatory Visit | Attending: Surgery | Admitting: Surgery

## 2023-12-12 ENCOUNTER — Ambulatory Visit (HOSPITAL_COMMUNITY)
Admission: RE | Admit: 2023-12-12 | Discharge: 2023-12-12 | Disposition: A | Discharge status: Home / Self Care | Payer: Medicare Other | Source: Ambulatory Visit | Attending: Surgery | Admitting: Surgery

## 2023-12-12 ENCOUNTER — Ambulatory Visit (INDEPENDENT_AMBULATORY_CARE_PROVIDER_SITE_OTHER): Payer: Medicare Other | Admitting: Surgery

## 2023-12-12 VITALS — BP 132/74 | HR 79 | Temp 98.3°F | Ht 66.0 in | Wt 194.0 lb

## 2023-12-12 DIAGNOSIS — N185 Chronic kidney disease, stage 5: Secondary | ICD-10-CM | POA: Diagnosis not present

## 2023-12-12 DIAGNOSIS — I70219 Atherosclerosis of native arteries of extremities with intermittent claudication, unspecified extremity: Secondary | ICD-10-CM

## 2023-12-12 NOTE — H&P (View-Only) (Signed)
 [facility] [place]  Patient name: [name] MRN: [id number] DOB: May 12, 1946 Sex: female   REASON FOR VISIT:    Follow up  HISOTRY OF PRESENT ILLNESS:    [name] is a 78 y.o. female with a history of peripheral vascular disease having undergone left SFA stenting in 2012.  She then underwent left femoral to below-knee popliteal bypass graft with vein on 11/18/2021.  She developed a distal anastomotic stenosis and underwent drug-coated balloon angioplasty on 03/15/2023.  The patient also has carotid stenosis which has been asymptomatic.  She has been experiencing a decline in her renal function.  Her GFR is now down to 16.  She is having bilateral leg swelling.  She is being referred for new access   She is medically managed for hypertension.  She is a smoker.  She is taking a statin for hypercholesterolemia.   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,  "small"   Anxiety    Arthritis    Cataract    CKD (chronic kidney disease), stage IV (HCC)    followed by [facility]   Colon polyps    Depression    Diabetes mellitus 1998   Diverticulosis    Emphysema of lung (HCC)    "no one has evry told me that"   ESOPHAGEAL STRICTURE 08/27/2008   Family history of adverse reaction to anesthesia    daughter n/v   GERD (gastroesophageal reflux disease)    Glaucoma    Hemorrhoids    Hiatal hernia    Hyperlipidemia    Hypertension 2000   Migraines    Neuropathy    fingers and toe   Peripheral vascular disease (HCC)    Phlebitis    30 years ago  left leg   Stroke [facility])    multiple mini strokes ( brain aneurysm ).  Right side weaker.     FAMILY HISTORY:   Family History  Problem Relation Age of Onset   CAD Mother 36       Died of MI   Hypertension Mother    Heart attack  Mother    Heart disease Mother    CAD Father 61       Died of MI   Heart disease Father    Heart attack Father    CAD Brother 57       Two brothers died of MI   Heart attack Brother    Heart disease Brother        Amputation   Diabetes Sister    Hypertension Sister    Heart attack Brother    Heart disease Brother    Stroke Sister    Colon cancer Neg Hx    Stomach cancer Neg Hx    Esophageal cancer Neg Hx     SOCIAL HISTORY:   Social History   Tobacco Use   Smoking status: Light Smoker    Current packs/day: 0.40    Average packs/day: 0.4 packs/day for 48.0 years (20.0 ttl pk-yrs)    Types: Cigarettes    Passive exposure: Never   Smokeless tobacco: Never   Tobacco comments:    11/18/20 in process of quiting    03/31/23 3 cigarettes a day   Substance Use Topics   Alcohol use: Never     ALLERGIES:   Allergies  Allergen Reactions   Ciprofloxacin Swelling   Ozempic (0.25 Or 0.5 Mg-Dose) [Semaglutide(0.25 Or 0.5mg -Dos)] Nausea And Vomiting   Plavix [Clopidogrel Bisulfate] Palpitations   Amoxicillin Itching and Swelling    FACE & EYES SWELL   Ace Inhibitors Cough    Sore throat   Atorvastatin     myalgias   Lyrica [Pregabalin] Itching and Nausea Only     gain weight   Penicillins Other (See Comments)    Pt unsure if there is an allergic reaction      CURRENT MEDICATIONS:   Current Outpatient Medications  Medication Sig Dispense Refill   acetaminophen (TYLENOL) 500 MG tablet Take 1,000 mg by mouth every 6 (six) hours as needed for mild pain (pain score 1-3).     amLODipine (NORVASC) 10 MG tablet TAKE 1 TABLET BY MOUTH EVERY DAY FOR BLOOD PRESSURE 90 tablet 3   aspirin 325 MG EC tablet Take 325 mg by mouth at bedtime.     Blood Glucose Monitoring Suppl (ONETOUCH VERIO FLEX SYSTEM) w/Device KIT USE AS ADVISED 1 kit 0   busPIRone (BUSPAR) 5 MG tablet TAKE 1 TABLET BY MOUTH THREE TIMES A DAY 270 tablet 1   Fluticasone-Umeclidin-Vilant (TRELEGY ELLIPTA) 200-62.5-25  MCG/ACT AEPB Inhale 1 puff into the lungs daily. 3 each 3   furosemide (LASIX) 40 MG tablet 1 TABLET BY MOUTH TWICE A DAY, START WITH TAKING 1 TABLET ONCE A DAY     glucose blood test strip Use as instructed 3x a day - One Touch Verio 300 each 3   hydrALAZINE (APRESOLINE) 25 MG tablet Take 1 tablet by mouth 3 (three) times daily.     insulin aspart (NOVOLOG FLEXPEN) 100 UNIT/ML FlexPen Inject 10-14 Units into the skin 3 (three) times daily before meals. (Patient taking differently: Inject 14 Units into the skin 2 (two) times daily before a meal.) 45 mL 3   insulin degludec (TRESIBA FLEXTOUCH) 200 UNIT/ML FlexTouch Pen Inject 40 Units into the skin daily. 18 mL 3   meclizine (ANTIVERT) 25 MG tablet 1/2 tab up to 3 times daily for motion sickness/dizziness. (Patient taking differently: Take 12.5 mg by mouth 3 (three) times daily as needed for dizziness. 1/2 tab up to 3 times daily for motion sickness/dizziness.) 30 tablet 0   Omega-3 Fatty Acids (FISH OIL) 1000 MG CAPS Take 1,000 mg by mouth in the morning and at bedtime.     OneTouch Delica Lancets 33G MISC Use 3x a day 200 each 3   Polyethyl Glycol-Propyl Glycol (SYSTANE ULTRA OP) Place 1 drop into both eyes daily at 12 noon. Additional drop if needed for dry eyes     rosuvastatin (CRESTOR) 20 MG tablet Take 1 tablet (20 mg total) by mouth daily. 90 tablet 0   tirzepatide (MOUNJARO) 2.5 MG/0.5ML Pen Inject 2.5 mg into the skin once a week. 6 mL 3   No current facility-administered medications for this visit.    REVIEW OF SYSTEMS:   [X]  denotes positive finding, [ ]  denotes negative finding Cardiac  Comments:  Chest pain or chest pressure:    Shortness of breath upon exertion:    Short of breath when lying flat:    Irregular heart rhythm:        Vascular    Pain in calf, thigh, or hip brought on by ambulation:    Pain in feet at night that wakes you up from your sleep:     Blood clot in your veins:    Leg swelling:  x       Pulmonary     Oxygen at home:    Productive cough:     Wheezing:         Neurologic    Sudden weakness in arms or legs:     Sudden numbness in arms or legs:     Sudden onset of difficulty speaking or slurred speech:    Temporary loss of vision in one eye:     Problems with dizziness:         Gastrointestinal    Blood in stool:     Vomited blood:         Genitourinary    Burning when urinating:     Blood in urine:        Psychiatric    Major depression:         Hematologic    Bleeding problems:    Problems with blood clotting too easily:        Skin    Rashes or ulcers:        Constitutional    Fever or chills:      PHYSICAL EXAM:   Vitals:   12/12/23 1356  BP: 132/74  Pulse: 79  Temp: 98.3 F (36.8 C)  SpO2: 98%  Weight: 194 lb (88 kg)  Height: 5\' 6"  (1.676 m)    GENERAL: The patient is a well-nourished female, in no acute distress. The vital signs are documented above. CARDIAC: There is a regular rate and rhythm.  VASCULAR: Bilateral lower extremity edema PULMONARY: Non-labored respirations ABDOMEN: Soft and non-tender with normal pitched bowel sounds.  MUSCULOSKELETAL: There are no major deformities or cyanosis. NEUROLOGIC: No focal weakness or paresthesias are detected. SKIN: There are no ulcers or rashes noted. PSYCHIATRIC: The patient has a normal affect.  STUDIES:   I have reviewed the following: Right Cephalic   Diameter (cm)Depth (cm)   Findings     +-----------------+-------------+----------+--------------+  Shoulder            0.30                               +-----------------+-------------+----------+--------------+  Prox upper arm       0.29                               +-----------------+-------------+----------+--------------+  Mid upper arm        0.34                               +-----------------+-------------+----------+--------------+  Dist upper arm       0.34                                +-----------------+-------------+----------+--------------+  Antecubital fossa    0.50                               +-----------------+-------------+----------+--------------+  Prox forearm         0.14                               +-----------------+-------------+----------+--------------+  Mid forearm  not visualized  +-----------------+-------------+----------+--------------+   +-----------------+-------------+----------+--------+  Right Basilic    Diameter (cm)Depth (cm)Findings  +-----------------+-------------+----------+--------+  Mid upper arm        0.49                         +-----------------+-------------+----------+--------+  Dist upper arm       0.33                         +-----------------+-------------+----------+--------+  Antecubital fossa    0.34                         +-----------------+-------------+----------+--------+   +-----------------+-------------+----------+--------------+  Left Cephalic    Diameter (cm)Depth (cm)   Findings     +-----------------+-------------+----------+--------------+  Shoulder            0.21                               +-----------------+-------------+----------+--------------+  Prox upper arm       0.19                               +-----------------+-------------+----------+--------------+  Mid upper arm        0.21                               +-----------------+-------------+----------+--------------+  Dist upper arm       0.16                               +-----------------+-------------+----------+--------------+  Antecubital fossa    0.31                               +-----------------+-------------+----------+--------------+  Prox forearm         0.08                               +-----------------+-------------+----------+--------------+  Mid forearm                             not visualized   +-----------------+-------------+----------+--------------+  Dist forearm                            not visualized  +-----------------+-------------+----------+--------------+   +--------------+-------------+----------+--------+  Left Basilic  Diameter (cm)Depth (cm)Findings  +--------------+-------------+----------+--------+  Mid upper arm     0.32                         +--------------+-------------+----------+--------+  Dist upper arm    0.21                         +--------------+-------------+----------+--------+  Arterial Right: Patent brachial, radial, and ulnar arteries.  Left: Patent brachial, radial, and ulnar arteries. The radial and        ulnar artery waveforms are biphasic.  MEDICAL ISSUES:   CKD 5: The patient is right-handed however preoperative vein mapping suggested that the surface veins on the right  are superior than the left.  She does not have a pacemaker or defibrillator.  I propose proceeding with a right brachiocephalic or brachiobasilic fistula.  We discussed the risk of not maturity and need for future interventions as well as steal.  All of her questions answered.  We will try to get her on the schedule next week.    Charlena Cross, MD, FACS [facility] [phone] Pager [phone]

## 2023-12-12 NOTE — Progress Notes (Signed)
 [facility] [place]  Patient name: [name] MRN: [id number] DOB: May 12, 1946 Sex: female   REASON FOR VISIT:    Follow up  HISOTRY OF PRESENT ILLNESS:    [name] is a 78 y.o. female with a history of peripheral vascular disease having undergone left SFA stenting in 2012.  She then underwent left femoral to below-knee popliteal bypass graft with vein on 11/18/2021.  She developed a distal anastomotic stenosis and underwent drug-coated balloon angioplasty on 03/15/2023.  The patient also has carotid stenosis which has been asymptomatic.  She has been experiencing a decline in her renal function.  Her GFR is now down to 16.  She is having bilateral leg swelling.  She is being referred for new access   She is medically managed for hypertension.  She is a smoker.  She is taking a statin for hypercholesterolemia.   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,  "small"   Anxiety    Arthritis    Cataract    CKD (chronic kidney disease), stage IV (HCC)    followed by [facility]   Colon polyps    Depression    Diabetes mellitus 1998   Diverticulosis    Emphysema of lung (HCC)    "no one has evry told me that"   ESOPHAGEAL STRICTURE 08/27/2008   Family history of adverse reaction to anesthesia    daughter n/v   GERD (gastroesophageal reflux disease)    Glaucoma    Hemorrhoids    Hiatal hernia    Hyperlipidemia    Hypertension 2000   Migraines    Neuropathy    fingers and toe   Peripheral vascular disease (HCC)    Phlebitis    30 years ago  left leg   Stroke [facility])    multiple mini strokes ( brain aneurysm ).  Right side weaker.     FAMILY HISTORY:   Family History  Problem Relation Age of Onset   CAD Mother 36       Died of MI   Hypertension Mother    Heart attack  Mother    Heart disease Mother    CAD Father 61       Died of MI   Heart disease Father    Heart attack Father    CAD Brother 57       Two brothers died of MI   Heart attack Brother    Heart disease Brother        Amputation   Diabetes Sister    Hypertension Sister    Heart attack Brother    Heart disease Brother    Stroke Sister    Colon cancer Neg Hx    Stomach cancer Neg Hx    Esophageal cancer Neg Hx     SOCIAL HISTORY:   Social History   Tobacco Use   Smoking status: Light Smoker    Current packs/day: 0.40    Average packs/day: 0.4 packs/day for 48.0 years (20.0 ttl pk-yrs)    Types: Cigarettes    Passive exposure: Never   Smokeless tobacco: Never   Tobacco comments:    11/18/20 in process of quiting    03/31/23 3 cigarettes a day   Substance Use Topics   Alcohol use: Never     ALLERGIES:   Allergies  Allergen Reactions   Ciprofloxacin Swelling   Ozempic (0.25 Or 0.5 Mg-Dose) [Semaglutide(0.25 Or 0.5mg -Dos)] Nausea And Vomiting   Plavix [Clopidogrel Bisulfate] Palpitations   Amoxicillin Itching and Swelling    FACE & EYES SWELL   Ace Inhibitors Cough    Sore throat   Atorvastatin     myalgias   Lyrica [Pregabalin] Itching and Nausea Only     gain weight   Penicillins Other (See Comments)    Pt unsure if there is an allergic reaction      CURRENT MEDICATIONS:   Current Outpatient Medications  Medication Sig Dispense Refill   acetaminophen (TYLENOL) 500 MG tablet Take 1,000 mg by mouth every 6 (six) hours as needed for mild pain (pain score 1-3).     amLODipine (NORVASC) 10 MG tablet TAKE 1 TABLET BY MOUTH EVERY DAY FOR BLOOD PRESSURE 90 tablet 3   aspirin 325 MG EC tablet Take 325 mg by mouth at bedtime.     Blood Glucose Monitoring Suppl (ONETOUCH VERIO FLEX SYSTEM) w/Device KIT USE AS ADVISED 1 kit 0   busPIRone (BUSPAR) 5 MG tablet TAKE 1 TABLET BY MOUTH THREE TIMES A DAY 270 tablet 1   Fluticasone-Umeclidin-Vilant (TRELEGY ELLIPTA) 200-62.5-25  MCG/ACT AEPB Inhale 1 puff into the lungs daily. 3 each 3   furosemide (LASIX) 40 MG tablet 1 TABLET BY MOUTH TWICE A DAY, START WITH TAKING 1 TABLET ONCE A DAY     glucose blood test strip Use as instructed 3x a day - One Touch Verio 300 each 3   hydrALAZINE (APRESOLINE) 25 MG tablet Take 1 tablet by mouth 3 (three) times daily.     insulin aspart (NOVOLOG FLEXPEN) 100 UNIT/ML FlexPen Inject 10-14 Units into the skin 3 (three) times daily before meals. (Patient taking differently: Inject 14 Units into the skin 2 (two) times daily before a meal.) 45 mL 3   insulin degludec (TRESIBA FLEXTOUCH) 200 UNIT/ML FlexTouch Pen Inject 40 Units into the skin daily. 18 mL 3   meclizine (ANTIVERT) 25 MG tablet 1/2 tab up to 3 times daily for motion sickness/dizziness. (Patient taking differently: Take 12.5 mg by mouth 3 (three) times daily as needed for dizziness. 1/2 tab up to 3 times daily for motion sickness/dizziness.) 30 tablet 0   Omega-3 Fatty Acids (FISH OIL) 1000 MG CAPS Take 1,000 mg by mouth in the morning and at bedtime.     OneTouch Delica Lancets 33G MISC Use 3x a day 200 each 3   Polyethyl Glycol-Propyl Glycol (SYSTANE ULTRA OP) Place 1 drop into both eyes daily at 12 noon. Additional drop if needed for dry eyes     rosuvastatin (CRESTOR) 20 MG tablet Take 1 tablet (20 mg total) by mouth daily. 90 tablet 0   tirzepatide (MOUNJARO) 2.5 MG/0.5ML Pen Inject 2.5 mg into the skin once a week. 6 mL 3   No current facility-administered medications for this visit.    REVIEW OF SYSTEMS:   [X]  denotes positive finding, [ ]  denotes negative finding Cardiac  Comments:  Chest pain or chest pressure:    Shortness of breath upon exertion:    Short of breath when lying flat:    Irregular heart rhythm:        Vascular    Pain in calf, thigh, or hip brought on by ambulation:    Pain in feet at night that wakes you up from your sleep:     Blood clot in your veins:    Leg swelling:  x       Pulmonary     Oxygen at home:    Productive cough:     Wheezing:         Neurologic    Sudden weakness in arms or legs:     Sudden numbness in arms or legs:     Sudden onset of difficulty speaking or slurred speech:    Temporary loss of vision in one eye:     Problems with dizziness:         Gastrointestinal    Blood in stool:     Vomited blood:         Genitourinary    Burning when urinating:     Blood in urine:        Psychiatric    Major depression:         Hematologic    Bleeding problems:    Problems with blood clotting too easily:        Skin    Rashes or ulcers:        Constitutional    Fever or chills:      PHYSICAL EXAM:   Vitals:   12/12/23 1356  BP: 132/74  Pulse: 79  Temp: 98.3 F (36.8 C)  SpO2: 98%  Weight: 194 lb (88 kg)  Height: 5\' 6"  (1.676 m)    GENERAL: The patient is a well-nourished female, in no acute distress. The vital signs are documented above. CARDIAC: There is a regular rate and rhythm.  VASCULAR: Bilateral lower extremity edema PULMONARY: Non-labored respirations ABDOMEN: Soft and non-tender with normal pitched bowel sounds.  MUSCULOSKELETAL: There are no major deformities or cyanosis. NEUROLOGIC: No focal weakness or paresthesias are detected. SKIN: There are no ulcers or rashes noted. PSYCHIATRIC: The patient has a normal affect.  STUDIES:   I have reviewed the following: Right Cephalic   Diameter (cm)Depth (cm)   Findings     +-----------------+-------------+----------+--------------+  Shoulder            0.30                               +-----------------+-------------+----------+--------------+  Prox upper arm       0.29                               +-----------------+-------------+----------+--------------+  Mid upper arm        0.34                               +-----------------+-------------+----------+--------------+  Dist upper arm       0.34                                +-----------------+-------------+----------+--------------+  Antecubital fossa    0.50                               +-----------------+-------------+----------+--------------+  Prox forearm         0.14                               +-----------------+-------------+----------+--------------+  Mid forearm  not visualized  +-----------------+-------------+----------+--------------+   +-----------------+-------------+----------+--------+  Right Basilic    Diameter (cm)Depth (cm)Findings  +-----------------+-------------+----------+--------+  Mid upper arm        0.49                         +-----------------+-------------+----------+--------+  Dist upper arm       0.33                         +-----------------+-------------+----------+--------+  Antecubital fossa    0.34                         +-----------------+-------------+----------+--------+   +-----------------+-------------+----------+--------------+  Left Cephalic    Diameter (cm)Depth (cm)   Findings     +-----------------+-------------+----------+--------------+  Shoulder            0.21                               +-----------------+-------------+----------+--------------+  Prox upper arm       0.19                               +-----------------+-------------+----------+--------------+  Mid upper arm        0.21                               +-----------------+-------------+----------+--------------+  Dist upper arm       0.16                               +-----------------+-------------+----------+--------------+  Antecubital fossa    0.31                               +-----------------+-------------+----------+--------------+  Prox forearm         0.08                               +-----------------+-------------+----------+--------------+  Mid forearm                             not visualized   +-----------------+-------------+----------+--------------+  Dist forearm                            not visualized  +-----------------+-------------+----------+--------------+   +--------------+-------------+----------+--------+  Left Basilic  Diameter (cm)Depth (cm)Findings  +--------------+-------------+----------+--------+  Mid upper arm     0.32                         +--------------+-------------+----------+--------+  Dist upper arm    0.21                         +--------------+-------------+----------+--------+  Arterial Right: Patent brachial, radial, and ulnar arteries.  Left: Patent brachial, radial, and ulnar arteries. The radial and        ulnar artery waveforms are biphasic.  MEDICAL ISSUES:   CKD 5: The patient is right-handed however preoperative vein mapping suggested that the surface veins on the right  are superior than the left.  She does not have a pacemaker or defibrillator.  I propose proceeding with a right brachiocephalic or brachiobasilic fistula.  We discussed the risk of not maturity and need for future interventions as well as steal.  All of her questions answered.  We will try to get her on the schedule next week.    Charlena Cross, MD, FACS [facility] [phone] Pager [phone]

## 2023-12-13 ENCOUNTER — Other Ambulatory Visit: Payer: Self-pay

## 2023-12-13 DIAGNOSIS — N184 Chronic kidney disease, stage 4 (severe): Secondary | ICD-10-CM | POA: Diagnosis not present

## 2023-12-13 DIAGNOSIS — R609 Edema, unspecified: Secondary | ICD-10-CM | POA: Diagnosis not present

## 2023-12-13 DIAGNOSIS — I70219 Atherosclerosis of native arteries of extremities with intermittent claudication, unspecified extremity: Secondary | ICD-10-CM

## 2023-12-13 DIAGNOSIS — D631 Anemia in chronic kidney disease: Secondary | ICD-10-CM | POA: Diagnosis not present

## 2023-12-13 DIAGNOSIS — E1122 Type 2 diabetes mellitus with diabetic chronic kidney disease: Secondary | ICD-10-CM | POA: Diagnosis not present

## 2023-12-13 DIAGNOSIS — R0602 Shortness of breath: Secondary | ICD-10-CM | POA: Diagnosis not present

## 2023-12-13 DIAGNOSIS — I129 Hypertensive chronic kidney disease with stage 1 through stage 4 chronic kidney disease, or unspecified chronic kidney disease: Secondary | ICD-10-CM | POA: Diagnosis not present

## 2023-12-13 DIAGNOSIS — N189 Chronic kidney disease, unspecified: Secondary | ICD-10-CM | POA: Diagnosis not present

## 2023-12-13 DIAGNOSIS — I5030 Unspecified diastolic (congestive) heart failure: Secondary | ICD-10-CM | POA: Diagnosis not present

## 2023-12-20 ENCOUNTER — Encounter (HOSPITAL_COMMUNITY): Payer: Self-pay | Admitting: Surgery

## 2023-12-20 ENCOUNTER — Other Ambulatory Visit: Payer: Self-pay

## 2023-12-20 NOTE — Progress Notes (Signed)
 Anesthesia Chart Review: Maury Dus  Case: [id number] Date/Time: 12/21/23 0815   Procedure: ARTERIOVENOUS (AV) FISTULA CREATION (Right)   Anesthesia type: Choice   Diagnosis: End stage renal disease (HCC) [N18.6]   Pre-op diagnosis: ESRD   [facility] / [facility]   Surgeons: Nada Libman, MD       DISCUSSION: Patient is a 78 year old female scheduled for the above procedure. She has CKD stage 5. Nephrologist referred her for access in anticipation of future hemodialysis.   History includes smoking, HTN, HLD, DM2 (with neuropathy; IDDM since 2005), CVA, carotid artery stenosis, PAD (atherectomy left SFA & popliteal arteries, left SFA stent 05/11/11; left SFA & popliteal angioplasty 01/04/12 & 08/15/12; left FPBG 11/18/21, s/p DCB angioplasty left FPBG 03/09/22 & 03/15/23), emphysema, CKD (stage V), GERD, hiatal hernia, cerebral aneurysm, glaucoma, thyroid surgery, ventral hernia (s/p repair 2003, 2010).   Last visit with pulmonology 11/22/23 for follow-up dyspnea, smoking, suspected COPD, and lung nodule. Trelegy prescribed in October 2024 and felt to be helpful.  PFTs and chest CT planned prior to next visit in six months.  Last cardiology follow-up with [facility] on 03/31/23. She has known coronary calcifications on imaging. Low risk stress test in 2020. Echo in 08/2022 in setting of hypertensive urgency showed LVEF 60-65%, normal LV systolic function, no RWMA, moderate LVH, grade 1 DD, normal RV systolic function. At July visit, she was concerned about higher BP readings, dyspnea, and palpitations. Coreg and hydralazine added to her regimen of olmesartan and amlodipine. Referred to HTN Clinic. Worsening dyspnea in setting of CKD. Could consider ischemic evaluation, but with advanced CKD would avoid LHC at that time. (Ultimately referred to pulmonology and started on Trelegy.) 14 day Zio monitor ordered to assess palpitations which was done in July 2024 and showed predominant NSR, infrequent NSVT  (longest 7 beats) and occasional runs of SVT (up to 16 beats), no sustained arrhythmias. No medication changes made. She has not scheduled follow-up visit yet. Current BP meds include amlodipine 5 mg daily, Lasix 80 mg BID, hydralazine 25 mg BID, metolazone 5 mg 3x/week. She is also on Crestor 20 mg daily and ASA 325 mg daily.   A1c 7.3% on 10/31/23. She is on Novolog 10-14 units TID with meals, Tresiba 40 units Q day, Mounjaro 2.5 mg weekly, last dose 12/08/23.   She is a same day work-up. Anesthesia team to evaluate on the day of surgery.    VS: Ht 5\' 6"  (1.676 m)   Wt 89.2 kg   BMI 31.74 kg/m  BP Readings from Last 3 Encounters:  12/12/23 132/74  11/22/23 (!) 148/74  11/21/23 (!) 160/94   Pulse Readings from Last 3 Encounters:  12/12/23 79  11/22/23 85  11/21/23 75     PROVIDERS: Donell Beers, FNP is PCP  Rollene Rotunda, MD is cardiologist Venida Jarvis, MD is vascular surgeon Bartolo Darter, MD is pulmonologist Carlus Pavlov, MD is endocrinologist Anthony Sar, MD is nephrologist   LABS: For day of surgery. Last results in [facility] include: Lab Results  Component Value Date   WBC 8.6 08/08/2023   HGB 14.9 08/08/2023   HCT 44.6 08/08/2023   PLT 250 08/08/2023   GLUCOSE 69 (L) 09/19/2023   CHOL 249 (H) 08/08/2023   TRIG 129 08/08/2023   HDL 57 08/08/2023   LDLDIRECT 191 (H) 09/19/2023   LDLCALC 166 (H) 08/08/2023   ALT 17 08/08/2023   AST 22 08/08/2023   NA 141 09/19/2023  K 4.5 09/19/2023   CL 105 09/19/2023   CREATININE 3.04 (H) 09/19/2023   BUN 41 (H) 09/19/2023   CO2 23 09/19/2023   TSH 2.44 08/08/2023   INR 0.9 11/17/2021   HGBA1C 7.3 (A) 10/31/2023   MICROALBUR 285.3 08/08/2023     IMAGES: CT Chest 07/21/23: IMPRESSION: 1. Stable appearance of ground-glass attenuating nodule within the medial right apex measuring 1.6 x 1.1 cm. This has been stable since 10/10/2017. 2. New solid nodule within the medial left upper lobe measures 6  mm. Non-contrast chest CT at 6-12 months is recommended. If the nodule is stable at time of repeat CT, then future CT at 18-24 months (from today's scan) is considered optional for low-risk patients, but is recommended for high-risk patients. This recommendation follows the consensus statement: Guidelines for Management of Incidental Pulmonary Nodules Detected on CT Images: From the Fleischner Society 2017; Radiology 2017; 284:228-243. 3. Coronary artery calcifications. 4. Aortic Atherosclerosis (ICD10-I70.0) and Emphysema (ICD10-J43.9).     EKG: 03/31/23: NSR   CV: US Carotid 08/09/23: Summary:  - Right Carotid: Velocities in the right ICA are consistent with a 40-59% stenosis. Hemodynamically significant plaque >50% visualized in the proximal CCA.  - Left Carotid: Velocities in the left ICA are consistent with a 1-39% stenosis. Hemodynamically significant plaque >50% visualized in the mid-distal CCA.  - Vertebrals:  Bilateral vertebral arteries demonstrate antegrade flow.  - Subclavians: Right subclavian artery flow was disturbed. Normal flow hemodynamics were seen in the left subclavian artery.    Long term ZioXT monitor 04/04/23 - 04/18/23: Predominant rhythm was normal sinus Infrequent runs of non sustained ventricular tachycardia with the longest run being 7 beats Occasional runs of supraventricular tachycardia longest being 16 beats.   No sustained arrhythmias   Echo 08/28/22:  1. Left ventricular ejection fraction, by estimation, is 60 to 65%. The  left ventricle has normal function. The left ventricle has no regional  wall motion abnormalities. There is moderate left ventricular hypertrophy  of the infero-lateral segment. Left  ventricular diastolic parameters are consistent with Grade I diastolic  dysfunction (impaired relaxation). Elevated left ventricular end-diastolic  pressure.   2. Right ventricular systolic function is normal. The right ventricular  size is normal.    3. The mitral valve is normal in structure. No evidence of mitral valve  regurgitation. No evidence of mitral stenosis.   4. The aortic valve is tricuspid. Aortic valve regurgitation is not  visualized. Aortic valve sclerosis/calcification is present, without any  evidence of aortic stenosis.   5. The inferior vena cava is normal in size with greater than 50%  respiratory variability, suggesting right atrial pressure of 3 mmHg.    Nuclear stress test 11/23/18: Nuclear stress EF: 56%. The left ventricular ejection fraction is normal (55-65%). There was no ST segment deviation noted during stress. The study is normal. This is a low risk study. Normal stress nuclear study with no ischemia or infarction.  Gated ejection fraction 56% with normal wall motion.    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,  "small"   Anxiety    Arthritis    Cataract    bilateral - surgery   CKD (chronic kidney disease), stage IV (HCC)    followed by [facility]   Colon polyps    Depression    Diabetes mellitus 1998   type 2  Diverticulosis    Emphysema of lung (HCC)    "no one has evry told me that"   ESOPHAGEAL STRICTURE 08/27/2008   Family history of adverse reaction to anesthesia    daughter n/v   GERD (gastroesophageal reflux disease)    Glaucoma    bilateral   Hemorrhoids    hx   Hiatal hernia    Hyperlipidemia    Hypertension 2000   Migraines    Neuropathy    fingers and toes   Peripheral vascular disease (HCC)    Phlebitis    30 years ago  left leg   Stroke [facility])    multiple mini strokes ( brain aneurysm ).  Right side weaker.    Past Surgical History:  Procedure Laterality Date   ABDOMINAL AORTAGRAM N/A 01/04/2012   Procedure: ABDOMINAL AORTAGRAM;  Surgeon: Nada Libman, MD;  Location: [facility] CATH LAB;  Service: Cardiovascular;  Laterality: N/A;   ABDOMINAL  AORTAGRAM N/A 08/15/2012   Procedure: ABDOMINAL Ronny Flurry;  Surgeon: Nada Libman, MD;  Location: [facility] CATH LAB;  Service: Cardiovascular;  Laterality: N/A;   ABDOMINAL AORTOGRAM W/LOWER EXTREMITY Left 11/17/2021   Procedure: ABDOMINAL AORTOGRAM W/LOWER EXTREMITY;  Surgeon: Nada Libman, MD;  [facility] INVASIVE CV LAB;  Service: Cardiovascular;  Laterality: Left;   ABDOMINAL AORTOGRAM W/LOWER EXTREMITY N/A 03/09/2022   Procedure: ABDOMINAL AORTOGRAM W/LOWER EXTREMITY;  Surgeon: Nada Libman, MD;  [facility] INVASIVE CV LAB;  Service: Cardiovascular;  Laterality: N/A;   ABDOMINAL AORTOGRAM W/LOWER EXTREMITY N/A 03/15/2023   Procedure: ABDOMINAL AORTOGRAM W/LOWER EXTREMITY;  Surgeon: Nada Libman, MD;  [facility] INVASIVE CV LAB;  Service: Cardiovascular;  Laterality: N/A;   ABDOMINAL HYSTERECTOMY  1984   BREAST SURGERY     cataract surgery Right 10/22/2015   cataract surgery Left 11/05/2015   CESAREAN SECTION     CHOLECYSTECTOMY     COLONOSCOPY  07/2010   DENTAL SURGERY  05/05/2012   left lower    ESOPHAGOGASTRODUODENOSCOPY  07/2010   EYE SURGERY  07/2013   Laser-Glaucoma   FEMORAL ARTERY STENT  05/11/2011   Left superficial femoral and popliteal artery   FEMORAL-POPLITEAL BYPASS GRAFT Left 11/18/2021   Procedure: LEFT FEMORAL-POPLITEAL ARTERY BYPASS GRAFT;  Surgeon: Nada Libman, MD;  Location[facility];  Service: Vascular;  Laterality: Left;   HERNIA REPAIR     times two   KNEE SURGERY     LOWER EXTREMITY ANGIOGRAM Left 08/15/2012   Procedure: LOWER EXTREMITY ANGIOGRAM;  Surgeon: Nada Libman, MD;  Location: [facility] CATH LAB;  Service: Cardiovascular;  Laterality: Left;  lt leg angio   PERIPHERAL VASCULAR BALLOON ANGIOPLASTY Left 03/09/2022   Procedure: PERIPHERAL VASCULAR BALLOON ANGIOPLASTY;  Surgeon: Nada Libman, MD;  [facility] INV[facility] CV LAB;  Service: Cardiovascular;  Laterality: Left;   PERIPHERAL VASCULAR BALLOON ANGIOPLASTY Left 03/15/2023    Procedure: PERIPHERAL VASCULAR BALLOON ANGIOPLASTY;  Surgeon: Nada Libman, MD;  [facility] INVAS[facility] CV LAB;  Service: Cardiovascular;  Laterality: Left;   rotator cuff surgery     THYROID SURGERY      MEDICATIONS: No current facility-administered medications for this encounter.    acetaminophen (TYLENOL) 500 MG tablet   amLODipine (NORVASC) 10 MG tablet   aspirin 325 MG EC tablet   busPIRone (BUSPAR) 5 MG tablet   calcitRIOL (ROCALTROL) 0.25 MCG capsule   Fluticasone-Umeclidin-Vilant (TRELEGY ELLIPTA) 200-62.5-25 MCG/ACT AEPB   furosemide (LASIX) 40 MG tablet   hydrALAZINE (APRESOLINE) 25 MG tablet   insulin  aspart (NOVOLOG FLEXPEN) 100 UNIT/ML FlexPen   insulin degludec (TRESIBA FLEXTOUCH) 200 UNIT/ML FlexTouch Pen   meclizine (ANTIVERT) 25 MG tablet   Omega-3 Fatty Acids (FISH OIL) 1000 MG CAPS   rosuvastatin (CRESTOR) 20 MG tablet   Blood Glucose Monitoring Suppl (ONETOUCH VERIO FLEX SYSTEM) w/Device KIT   glucose blood test strip   metolazone (ZAROXOLYN) 5 MG tablet   OneTouch Delica Lancets 33G MISC   tirzepatide [facility]) 2.5 MG/0.5ML Pen    Shonna Chock, PA-C Surgical Short Stay/Anesthesiology [facility] Phone [phone] [facility] Phone [phone] 12/20/2023 10:57 AM

## 2023-12-20 NOTE — Anesthesia Preprocedure Evaluation (Addendum)
 Anesthesia Evaluation  Patient identified by MRN, date of birth, ID band Patient awake    Reviewed: Allergy & Precautions, H&P , NPO status , Patient's Chart, lab work & pertinent test results  Airway Mallampati: III  TM Distance: >3 FB Neck ROM: Full    Dental no notable dental hx. (+) Teeth Intact, Dental Advisory Given   Pulmonary COPD,  COPD inhaler, Current Smoker and Patient abstained from smoking.   Pulmonary exam normal breath sounds clear to auscultation       Cardiovascular Exercise Tolerance: Good hypertension, Pt. on medications + CAD and + Peripheral Vascular Disease   Rhythm:Regular Rate:Normal     Neuro/Psych  Headaches  Anxiety Depression    TIA   GI/Hepatic Neg liver ROS, hiatal hernia,GERD  ,,  Endo/Other  diabetes, Type 2, Insulin Dependent, Oral Hypoglycemic Agents    Renal/GU CRFRenal disease  negative genitourinary   Musculoskeletal  (+) Arthritis , Osteoarthritis,    Abdominal   Peds  Hematology negative hematology ROS (+)   Anesthesia Other Findings   Reproductive/Obstetrics negative OB ROS                             Anesthesia Physical Anesthesia Plan  ASA: 3  Anesthesia Plan: General   Post-op Pain Management: Tylenol PO (pre-op)*   Induction: Intravenous  PONV Risk Score and Plan: 1 and Propofol infusion, Ondansetron and TIVA  Airway Management Planned: LMA  Additional Equipment:   Intra-op Plan:   Post-operative Plan: Extubation in OR  Informed Consent: I have reviewed the patients History and Physical, chart, labs and discussed the procedure including the risks, benefits and alternatives for the proposed anesthesia with the patient or authorized representative who has indicated his/her understanding and acceptance.     Dental advisory given  Plan Discussed with: CRNA  Anesthesia Plan Comments: (PAT note written 12/20/2023 by Shonna Chock,  PA-C.  )       Anesthesia Quick Evaluation

## 2023-12-20 NOTE — Progress Notes (Signed)
 Cardiologist - Dr Rollene Rotunda Endocrinology - Dr Carlus Pavlov PCP/Internal Med - Edwin Dada, FNP Pulmonology - Dr Chilton Greathouse Nephrology- Dr Anthony Sar  CT Chest x-ray - 07/21/23 EKG - 03/31/23 Stress Test - 11/23/18 ECHO - 08/28/22 Cardiac Cath - no  ICD Pacemaker/Loop - n/a  Sleep Study -  n/a  Diabetes Type 2 Last dose of Mounjaro was on 12/08/23 (SDW Call).   GLP1 instructions: Hold until after 12/21/23 surgery.  Novolog Insulin - take 4-7 units prn high CBG.  Evaristo Bury - take 20 units on DOS.  If your blood sugar is less than 70 mg/dL, you will need to treat for low blood sugar: Treat a low blood sugar (less than 70 mg/dL) with  cup of clear juice (cranberry or apple), 4 glucose tablets, OR glucose gel. Recheck blood sugar in 15 minutes after treatment (to make sure it is greater than 70 mg/dL). If your blood sugar is not greater than 70 mg/dL on recheck, call [phone] for further instructions.  Blood Thinner Instructions:  n/a  Aspirin Instructions: continue  NPO  Anesthesia review: Yes  STOP now taking any Aspirin (unless otherwise instructed by your surgeon), Aleve, Naproxen, Ibuprofen, Motrin, Advil, Goody's, BC's, all herbal medications, fish oil, and all vitamins.   Coronavirus Screening Do you have any of the following symptoms:  Cough yes/no: No Fever (>100.58F)  yes/no: No Runny nose yes/no: No Sore throat yes/no: No Difficulty breathing/shortness of breath  yes/no: No  Have you traveled in the last 14 days and where? yes/no: No  Patient verbalized understanding of instructions that were given via phone.

## 2023-12-21 ENCOUNTER — Other Ambulatory Visit: Payer: Self-pay

## 2023-12-21 ENCOUNTER — Ambulatory Visit (HOSPITAL_COMMUNITY)
Admission: RE | Admit: 2023-12-21 | Discharge: 2023-12-21 | Disposition: A | Discharge status: Home / Self Care | Attending: Surgery | Admitting: Surgery

## 2023-12-21 ENCOUNTER — Other Ambulatory Visit (HOSPITAL_COMMUNITY): Payer: Self-pay

## 2023-12-21 ENCOUNTER — Ambulatory Visit (HOSPITAL_BASED_OUTPATIENT_CLINIC_OR_DEPARTMENT_OTHER): Payer: Self-pay | Admitting: Vascular Surgery

## 2023-12-21 ENCOUNTER — Ambulatory Visit (HOSPITAL_COMMUNITY): Payer: Self-pay | Admitting: Vascular Surgery

## 2023-12-21 ENCOUNTER — Encounter (HOSPITAL_COMMUNITY): Payer: Self-pay | Admitting: Surgery

## 2023-12-21 ENCOUNTER — Encounter (HOSPITAL_COMMUNITY)
Admission: RE | Disposition: A | Discharge status: Home / Self Care | Payer: Self-pay | Source: Home / Self Care | Attending: Surgery

## 2023-12-21 DIAGNOSIS — J449 Chronic obstructive pulmonary disease, unspecified: Secondary | ICD-10-CM | POA: Diagnosis not present

## 2023-12-21 DIAGNOSIS — Z8673 Personal history of transient ischemic attack (TIA), and cerebral infarction without residual deficits: Secondary | ICD-10-CM | POA: Insufficient documentation

## 2023-12-21 DIAGNOSIS — K219 Gastro-esophageal reflux disease without esophagitis: Secondary | ICD-10-CM | POA: Diagnosis not present

## 2023-12-21 DIAGNOSIS — E1122 Type 2 diabetes mellitus with diabetic chronic kidney disease: Secondary | ICD-10-CM | POA: Insufficient documentation

## 2023-12-21 DIAGNOSIS — J439 Emphysema, unspecified: Secondary | ICD-10-CM | POA: Insufficient documentation

## 2023-12-21 DIAGNOSIS — F17210 Nicotine dependence, cigarettes, uncomplicated: Secondary | ICD-10-CM | POA: Diagnosis not present

## 2023-12-21 DIAGNOSIS — Z7985 Long-term (current) use of injectable non-insulin antidiabetic drugs: Secondary | ICD-10-CM | POA: Diagnosis not present

## 2023-12-21 DIAGNOSIS — I129 Hypertensive chronic kidney disease with stage 1 through stage 4 chronic kidney disease, or unspecified chronic kidney disease: Secondary | ICD-10-CM | POA: Diagnosis not present

## 2023-12-21 DIAGNOSIS — I2510 Atherosclerotic heart disease of native coronary artery without angina pectoris: Secondary | ICD-10-CM | POA: Insufficient documentation

## 2023-12-21 DIAGNOSIS — K449 Diaphragmatic hernia without obstruction or gangrene: Secondary | ICD-10-CM | POA: Insufficient documentation

## 2023-12-21 DIAGNOSIS — E785 Hyperlipidemia, unspecified: Secondary | ICD-10-CM | POA: Diagnosis not present

## 2023-12-21 DIAGNOSIS — I120 Hypertensive chronic kidney disease with stage 5 chronic kidney disease or end stage renal disease: Secondary | ICD-10-CM | POA: Insufficient documentation

## 2023-12-21 DIAGNOSIS — E1151 Type 2 diabetes mellitus with diabetic peripheral angiopathy without gangrene: Secondary | ICD-10-CM | POA: Diagnosis not present

## 2023-12-21 DIAGNOSIS — N189 Chronic kidney disease, unspecified: Secondary | ICD-10-CM | POA: Diagnosis not present

## 2023-12-21 DIAGNOSIS — Z7951 Long term (current) use of inhaled steroids: Secondary | ICD-10-CM | POA: Insufficient documentation

## 2023-12-21 DIAGNOSIS — F32A Depression, unspecified: Secondary | ICD-10-CM | POA: Insufficient documentation

## 2023-12-21 DIAGNOSIS — N186 End stage renal disease: Secondary | ICD-10-CM | POA: Insufficient documentation

## 2023-12-21 DIAGNOSIS — N184 Chronic kidney disease, stage 4 (severe): Secondary | ICD-10-CM | POA: Diagnosis not present

## 2023-12-21 DIAGNOSIS — Z794 Long term (current) use of insulin: Secondary | ICD-10-CM | POA: Insufficient documentation

## 2023-12-21 DIAGNOSIS — F419 Anxiety disorder, unspecified: Secondary | ICD-10-CM | POA: Insufficient documentation

## 2023-12-21 DIAGNOSIS — I70219 Atherosclerosis of native arteries of extremities with intermittent claudication, unspecified extremity: Secondary | ICD-10-CM

## 2023-12-21 HISTORY — PX: AV FISTULA PLACEMENT: SHX1204

## 2023-12-21 LAB — POCT I-STAT, CHEM 8
BUN: 36 mg/dL — ABNORMAL HIGH (ref 8–23)
Calcium, Ion: 1.07 mmol/L — ABNORMAL LOW (ref 1.15–1.40)
Chloride: 97 mmol/L — ABNORMAL LOW (ref 98–111)
Creatinine, Ser: 3.7 mg/dL — ABNORMAL HIGH (ref 0.44–1.00)
Glucose, Bld: 132 mg/dL — ABNORMAL HIGH (ref 70–99)
HCT: 45 % (ref 36.0–46.0)
Hemoglobin: 15.3 g/dL — ABNORMAL HIGH (ref 12.0–15.0)
Potassium: 3 mmol/L — ABNORMAL LOW (ref 3.5–5.1)
Sodium: 138 mmol/L (ref 135–145)
TCO2: 35 mmol/L — ABNORMAL HIGH (ref 22–32)

## 2023-12-21 LAB — GLUCOSE, CAPILLARY
Glucose-Capillary: 137 mg/dL — ABNORMAL HIGH (ref 70–99)
Glucose-Capillary: 202 mg/dL — ABNORMAL HIGH (ref 70–99)

## 2023-12-21 SURGERY — ARTERIOVENOUS (AV) FISTULA CREATION
Anesthesia: General | Site: Arm Upper | Laterality: Right

## 2023-12-21 MED ORDER — CHLORHEXIDINE GLUCONATE 4 % EX SOLN: 60.0000 mL | Freq: Once | CUTANEOUS | Status: DC

## 2023-12-21 MED ORDER — VANCOMYCIN HCL IN DEXTROSE 1-5 GM/200ML-% IV SOLN
1000.0000 mg | INTRAVENOUS | Status: AC
  Administered 2023-12-21: 1000 mg via INTRAVENOUS
  Filled 2023-12-21: qty 200

## 2023-12-21 MED ORDER — SODIUM CHLORIDE 0.9 % IV SOLN: INTRAVENOUS | Status: DC

## 2023-12-21 MED ORDER — LIDOCAINE 2% (20 MG/ML) 5 ML SYRINGE
INTRAMUSCULAR | Status: AC
  Filled 2023-12-21: qty 5

## 2023-12-21 MED ORDER — EPHEDRINE SULFATE-NACL 50-0.9 MG/10ML-% IV SOSY
PREFILLED_SYRINGE | INTRAVENOUS | Status: DC | PRN
  Administered 2023-12-21 (×2): 5 mg via INTRAVENOUS

## 2023-12-21 MED ORDER — ORAL CARE MOUTH RINSE: 15.0000 mL | Freq: Once | OROMUCOSAL | Status: AC

## 2023-12-21 MED ORDER — CHLORHEXIDINE GLUCONATE 0.12 % MT SOLN: 15.0000 mL | Freq: Once | OROMUCOSAL | Status: AC

## 2023-12-21 MED ORDER — SUCCINYLCHOLINE CHLORIDE 200 MG/10ML IV SOSY
PREFILLED_SYRINGE | INTRAVENOUS | Status: DC | PRN
Start: 2023-12-21 — End: 2023-12-21
  Administered 2023-12-21: 100 mg via INTRAVENOUS

## 2023-12-21 MED ORDER — ETOMIDATE 2 MG/ML IV SOLN
INTRAVENOUS | Status: AC
  Filled 2023-12-21: qty 10

## 2023-12-21 MED ORDER — ALBUMIN HUMAN 5 % IV SOLN: INTRAVENOUS | Status: DC | PRN

## 2023-12-21 MED ORDER — FENTANYL CITRATE (PF) 250 MCG/5ML IJ SOLN
INTRAMUSCULAR | Status: AC
  Filled 2023-12-21: qty 5

## 2023-12-21 MED ORDER — PHENYLEPHRINE HCL-NACL 20-0.9 MG/250ML-% IV SOLN
INTRAVENOUS | Status: DC | PRN
  Administered 2023-12-21: 80 ug via INTRAVENOUS
  Administered 2023-12-21: 30 ug/min via INTRAVENOUS

## 2023-12-21 MED ORDER — HEPARIN 6000 UNIT IRRIGATION SOLUTION
Status: DC | PRN
  Administered 2023-12-21: 1

## 2023-12-21 MED ORDER — FENTANYL CITRATE (PF) 100 MCG/2ML IJ SOLN
INTRAMUSCULAR | Status: AC
  Filled 2023-12-21: qty 2

## 2023-12-21 MED ORDER — MIDAZOLAM HCL 2 MG/2ML IJ SOLN
INTRAMUSCULAR | Status: AC
  Filled 2023-12-21: qty 2

## 2023-12-21 MED ORDER — CHLORHEXIDINE GLUCONATE 0.12 % MT SOLN
OROMUCOSAL | Status: AC
  Administered 2023-12-21: 15 mL via OROMUCOSAL
  Filled 2023-12-21: qty 15

## 2023-12-21 MED ORDER — SUGAMMADEX SODIUM 200 MG/2ML IV SOLN
INTRAVENOUS | Status: DC | PRN
Start: 2023-12-21 — End: 2023-12-21
  Administered 2023-12-21: 200 mg via INTRAVENOUS

## 2023-12-21 MED ORDER — ACETAMINOPHEN 500 MG PO TABS
1000.0000 mg | ORAL_TABLET | Freq: Once | ORAL | Status: AC
  Administered 2023-12-21: 1000 mg via ORAL
  Filled 2023-12-21: qty 2

## 2023-12-21 MED ORDER — PROPOFOL 10 MG/ML IV BOLUS
INTRAVENOUS | Status: DC | PRN
  Administered 2023-12-21: 120 mg via INTRAVENOUS
  Administered 2023-12-21: 80 mg via INTRAVENOUS

## 2023-12-21 MED ORDER — ONDANSETRON HCL 4 MG/2ML IJ SOLN
INTRAMUSCULAR | Status: DC | PRN
  Administered 2023-12-21: 4 mg via INTRAVENOUS

## 2023-12-21 MED ORDER — PROPOFOL 1000 MG/100ML IV EMUL
INTRAVENOUS | Status: AC
  Filled 2023-12-21: qty 100

## 2023-12-21 MED ORDER — LIDOCAINE-EPINEPHRINE (PF) 1 %-1:200000 IJ SOLN
INTRAMUSCULAR | Status: AC
  Filled 2023-12-21: qty 30

## 2023-12-21 MED ORDER — VANCOMYCIN HCL 1000 MG IV SOLR
INTRAVENOUS | Status: DC | PRN
  Administered 2023-12-21: 1000 mg via INTRAVENOUS

## 2023-12-21 MED ORDER — SODIUM CHLORIDE (PF) 0.9 % IJ SOLN
INTRAMUSCULAR | Status: AC
  Filled 2023-12-21: qty 10

## 2023-12-21 MED ORDER — ROCURONIUM BROMIDE 100 MG/10ML IV SOLN
INTRAVENOUS | Status: DC | PRN
  Administered 2023-12-21: 30 mg via INTRAVENOUS

## 2023-12-21 MED ORDER — OXYCODONE-ACETAMINOPHEN 5-325 MG PO TABS
1.0000 | ORAL_TABLET | Freq: Four times a day (QID) | ORAL | 0 refills | Status: DC | PRN
  Filled 2023-12-21: qty 12, 3d supply, fill #0

## 2023-12-21 MED ORDER — PHENYLEPHRINE 80 MCG/ML (10ML) SYRINGE FOR IV PUSH (FOR BLOOD PRESSURE SUPPORT)
PREFILLED_SYRINGE | INTRAVENOUS | Status: DC | PRN
  Administered 2023-12-21: 160 ug via INTRAVENOUS

## 2023-12-21 MED ORDER — MIDAZOLAM HCL 2 MG/2ML IJ SOLN
INTRAMUSCULAR | Status: DC | PRN
Start: 2023-12-21 — End: 2023-12-21
  Administered 2023-12-21: 1 mg via INTRAVENOUS

## 2023-12-21 MED ORDER — LIDOCAINE 2% (20 MG/ML) 5 ML SYRINGE
INTRAMUSCULAR | Status: DC | PRN
  Administered 2023-12-21: 60 mg via INTRAVENOUS

## 2023-12-21 MED ORDER — PROPOFOL 500 MG/50ML IV EMUL
INTRAVENOUS | Status: DC | PRN
  Administered 2023-12-21: 20 ug/kg/min via INTRAVENOUS

## 2023-12-21 MED ORDER — DEXAMETHASONE SODIUM PHOSPHATE 10 MG/ML IJ SOLN
INTRAMUSCULAR | Status: DC | PRN
  Administered 2023-12-21: 5 mg via INTRAVENOUS

## 2023-12-21 MED ORDER — PROPOFOL 10 MG/ML IV BOLUS
INTRAVENOUS | Status: AC
  Filled 2023-12-21: qty 20

## 2023-12-21 MED ORDER — 0.9 % SODIUM CHLORIDE (POUR BTL) OPTIME
TOPICAL | Status: DC | PRN
  Administered 2023-12-21: 1000 mL

## 2023-12-21 MED ORDER — HEPARIN 6000 UNIT IRRIGATION SOLUTION
Status: AC
  Filled 2023-12-21: qty 500

## 2023-12-21 MED ORDER — FENTANYL CITRATE (PF) 100 MCG/2ML IJ SOLN
25.0000 ug | INTRAMUSCULAR | Status: DC | PRN
  Administered 2023-12-21: 25 ug via INTRAVENOUS

## 2023-12-21 SURGICAL SUPPLY — 26 items
ARMBAND PINK RESTRICT EXTREMIT (MISCELLANEOUS) ×4 IMPLANT
BAG COUNTER SPONGE SURGICOUNT (BAG) ×2 IMPLANT
CANISTER SUCT 3000ML PPV (MISCELLANEOUS) ×2 IMPLANT
CLIP TI MEDIUM 6 (CLIP) ×2 IMPLANT
CLIP TI WIDE RED SMALL 6 (CLIP) ×2 IMPLANT
COVER PROBE W GEL 5X96 (DRAPES) ×2 IMPLANT
DERMABOND ADVANCED .7 DNX12 (GAUZE/BANDAGES/DRESSINGS) ×2 IMPLANT
ELECT REM PT RETURN 9FT ADLT (ELECTROSURGICAL) ×1 IMPLANT
ELECTRODE REM PT RTRN 9FT ADLT (ELECTROSURGICAL) ×2 IMPLANT
GLOVE SURG SS PI 7.5 STRL IVOR (GLOVE) ×6 IMPLANT
GOWN STRL REUS W/ TWL LRG LVL3 (GOWN DISPOSABLE) ×4 IMPLANT
GOWN STRL REUS W/ TWL XL LVL3 (GOWN DISPOSABLE) ×2 IMPLANT
HEMOSTAT SNOW SURGICEL 2X4 (HEMOSTASIS) IMPLANT
KIT BASIN OR (CUSTOM PROCEDURE TRAY) ×2 IMPLANT
KIT TURNOVER KIT B (KITS) ×2 IMPLANT
NS IRRIG 1000ML POUR BTL (IV SOLUTION) ×2 IMPLANT
PACK CV ACCESS (CUSTOM PROCEDURE TRAY) ×2 IMPLANT
PAD ARMBOARD POSITIONER FOAM (MISCELLANEOUS) ×4 IMPLANT
SLING ARM FOAM STRAP LRG (SOFTGOODS) IMPLANT
SLING ARM FOAM STRAP MED (SOFTGOODS) IMPLANT
SUT PROLENE 6 0 CC (SUTURE) ×2 IMPLANT
SUT VIC AB 3-0 SH 27X BRD (SUTURE) ×2 IMPLANT
SUT VICRYL 4-0 PS2 18IN ABS (SUTURE) ×2 IMPLANT
TOWEL GREEN STERILE (TOWEL DISPOSABLE) ×2 IMPLANT
UNDERPAD 30X36 HEAVY ABSORB (UNDERPADS AND DIAPERS) ×2 IMPLANT
WATER STERILE IRR 1000ML POUR (IV SOLUTION) ×2 IMPLANT

## 2023-12-21 NOTE — Discharge Instructions (Signed)

## 2023-12-21 NOTE — Transfer of Care (Signed)
 Immediate Anesthesia Transfer of Care Note  Patient: [name]  Procedure(s) Performed: RIGHT ARM BRACHIOCEPHALIC ARTERIOVENOUS (AV) FISTULA CREATION (Right: Arm Upper)  Patient Location: PACU  Anesthesia Type:General  Level of Consciousness: drowsy  Airway & Oxygen Therapy: Patient Spontanous Breathing and Patient connected to face mask oxygen  Post-op Assessment: Report given to RN and Post -op Vital signs reviewed and stable  Post vital signs: Reviewed and stable  Last Vitals:  Vitals Value Taken Time  BP    Temp 187/53   Pulse 67 12/21/23 1011  Resp    SpO2 99 % 12/21/23 1011  Vitals shown include unfiled device data.  Last Pain:  Vitals:   12/21/23 0719  PainSc: 0-No pain         Complications: No notable events documented.

## 2023-12-21 NOTE — Anesthesia Procedure Notes (Signed)
 Procedure Name: Intubation Date/Time: 12/21/2023 8:50 AM  Performed by: Marcy Siren, RNPre-anesthesia Checklist: Patient identified, Emergency Drugs available, Suction available and Patient being monitored Patient Re-evaluated:Patient Re-evaluated prior to induction Oxygen Delivery Method: Circle System Utilized Preoxygenation: Pre-oxygenation with 100% oxygen Induction Type: IV induction Ventilation: Mask ventilation without difficulty Laryngoscope Size: Mac and 4 Grade View: Grade II Tube type: Oral Tube size: 7.0 mm Number of attempts: 1 Airway Equipment and Method: Stylet and Oral airway Placement Confirmation: ETT inserted through vocal cords under direct vision, positive ETCO2 and breath sounds checked- equal and bilateral Secured at: 22 cm Tube secured with: Tape Dental Injury: Teeth and Oropharynx as per pre-operative assessment  Comments: Grade 2B view with MAC 4 blade first attempt. Teeth, lips and tongue unchanged. Eyes, nose, mouth, chin and ears free from external pressure. Head and neck midline.

## 2023-12-21 NOTE — Interval H&P Note (Signed)
 History and Physical Interval Note:  12/21/2023 8:16 AM  [name]  has presented today for surgery, with the diagnosis of ESRD.  The various methods of treatment have been discussed with the patient and family. After consideration of risks, benefits and other options for treatment, the patient has consented to  Procedure(s): ARTERIOVENOUS (AV) FISTULA CREATION (Right) as a surgical intervention.  The patient's history has been reviewed, patient examined, no change in status, stable for surgery.  I have reviewed the patient's chart and labs.  Questions were answered to the patient's satisfaction.     Durene Cal

## 2023-12-21 NOTE — Op Note (Signed)
    Patient name: [name] MRN: [id number] DOB: [birth year] Sex: female  12/21/2023 Pre-operative Diagnosis: CKD 4 Post-operative diagnosis:  Same Surgeon:  Durene Cal Assistants:  Clinton Gallant, PA Procedure:   Right brachiocephalic fistula Anesthesia:  general Blood Loss:  minimal Specimens:  none  Findings: 4 mm vein.  4 mm disease-free artery  Indications: This is a 78 year old female with progressive renal insufficiency who is here today for dialysis access.  Procedure:  The patient was identified in the holding area and taken to [facility] OR ROOM 16  The patient was then placed supine on the table. general anesthesia was administered.  The patient was prepped and draped in the usual sterile fashion.  A time out was called and antibiotics were administered.  A PA was necessary to expedite the procedure and assist with technical details.  She helped with exposure by providing suction and retraction.  She helped with the anastomosis by following the suture.  She helped with wound closure.  Ultrasound was used to evaluate the surface veins in the right arm.  The cephalic vein appeared to be of excellent caliber throughout the upper arm although slightly deep.  It measured 45 mm by ultrasound.  A transverse incision was made just proximal to the antecubital crease.  Cautery was used divide subcutaneous tissue.  The brachial artery was identified and exposed and encircled with Vesseloops.  It was a 4 mm disease-free artery.  I then dissected out the cephalic vein throughout the width of the incision.  Several side branches were ligated between silk ties.  The vein was marked for orientation and then ligated distally with a 2-0 silk tie.  The vein distended nicely with heparin saline.  Next, the brachial artery was occluded with vascular clamps and a #11 blade was used to make an arteriotomy.  This was extended longitudinally with Potts scissors.  The vein was then spatulated to fit the size the  arteriotomy and a running anastomosis was created with 6-0 Prolene.  Prior to completion, the appropriate flushing maneuvers were performed and the anastomosis was completed.  Blood flow was then reestablished.  There was a palpable pulse in the radial artery and an excellent thrill within the fistula.  The wound was irrigated.  Hemostasis was achieved.  The incision was closed with 2 layers of Vicryl followed by Dermabond.  There were no immediate complications.   Disposition: To PACU stable.   Juleen China, M.D., [facility] [place] Office: [phone] Pager:  [phone]

## 2023-12-21 NOTE — Anesthesia Postprocedure Evaluation (Signed)
 Anesthesia Post Note  Patient: [name]  Procedure(s) Performed: RIGHT ARM BRACHIOCEPHALIC ARTERIOVENOUS (AV) FISTULA CREATION (Right: Arm Upper)     Patient location during evaluation: PACU Anesthesia Type: General Level of consciousness: awake and alert Pain management: pain level controlled Vital Signs Assessment: post-procedure vital signs reviewed and stable Respiratory status: spontaneous breathing, nonlabored ventilation and respiratory function stable Cardiovascular status: blood pressure returned to baseline and stable Postop Assessment: no apparent nausea or vomiting Anesthetic complications: no  No notable events documented.  Last Vitals:  Vitals:   12/21/23 1045 12/21/23 1100  BP: (!) 170/53 (!) 176/51  Pulse: 66 69  Resp: 16 16  Temp:  36.6 C  SpO2: 91% 94%    Last Pain:  Vitals:   12/21/23 1100  PainSc: 0-No pain                 Jendaya Gossett,W. EDMOND

## 2023-12-22 ENCOUNTER — Encounter (HOSPITAL_COMMUNITY): Payer: Self-pay | Admitting: Surgery

## 2024-01-03 ENCOUNTER — Other Ambulatory Visit: Payer: Self-pay

## 2024-01-03 ENCOUNTER — Inpatient Hospital Stay (HOSPITAL_COMMUNITY)
Admission: EM | Admit: 2024-01-03 | Discharge: 2024-01-10 | DRG: 291 | Disposition: A | Discharge status: Home Health | Attending: Internal Medicine | Admitting: Internal Medicine

## 2024-01-03 ENCOUNTER — Emergency Department (HOSPITAL_COMMUNITY)

## 2024-01-03 ENCOUNTER — Encounter (HOSPITAL_COMMUNITY): Payer: Self-pay

## 2024-01-03 DIAGNOSIS — H409 Unspecified glaucoma: Secondary | ICD-10-CM | POA: Diagnosis present

## 2024-01-03 DIAGNOSIS — J439 Emphysema, unspecified: Secondary | ICD-10-CM | POA: Diagnosis not present

## 2024-01-03 DIAGNOSIS — K219 Gastro-esophageal reflux disease without esophagitis: Secondary | ICD-10-CM | POA: Diagnosis present

## 2024-01-03 DIAGNOSIS — N185 Chronic kidney disease, stage 5: Secondary | ICD-10-CM | POA: Diagnosis not present

## 2024-01-03 DIAGNOSIS — Z888 Allergy status to other drugs, medicaments and biological substances status: Secondary | ICD-10-CM

## 2024-01-03 DIAGNOSIS — I160 Hypertensive urgency: Secondary | ICD-10-CM | POA: Diagnosis present

## 2024-01-03 DIAGNOSIS — R7989 Other specified abnormal findings of blood chemistry: Secondary | ICD-10-CM | POA: Diagnosis not present

## 2024-01-03 DIAGNOSIS — F32A Depression, unspecified: Secondary | ICD-10-CM | POA: Diagnosis present

## 2024-01-03 DIAGNOSIS — E785 Hyperlipidemia, unspecified: Secondary | ICD-10-CM | POA: Diagnosis present

## 2024-01-03 DIAGNOSIS — R918 Other nonspecific abnormal finding of lung field: Secondary | ICD-10-CM | POA: Diagnosis not present

## 2024-01-03 DIAGNOSIS — E1151 Type 2 diabetes mellitus with diabetic peripheral angiopathy without gangrene: Secondary | ICD-10-CM | POA: Diagnosis present

## 2024-01-03 DIAGNOSIS — E1169 Type 2 diabetes mellitus with other specified complication: Secondary | ICD-10-CM | POA: Insufficient documentation

## 2024-01-03 DIAGNOSIS — I1A0 Resistant hypertension: Secondary | ICD-10-CM | POA: Diagnosis present

## 2024-01-03 DIAGNOSIS — I5033 Acute on chronic diastolic (congestive) heart failure: Secondary | ICD-10-CM | POA: Diagnosis not present

## 2024-01-03 DIAGNOSIS — I10 Essential (primary) hypertension: Secondary | ICD-10-CM | POA: Diagnosis not present

## 2024-01-03 DIAGNOSIS — E8779 Other fluid overload: Secondary | ICD-10-CM | POA: Diagnosis not present

## 2024-01-03 DIAGNOSIS — Z8249 Family history of ischemic heart disease and other diseases of the circulatory system: Secondary | ICD-10-CM

## 2024-01-03 DIAGNOSIS — Z8673 Personal history of transient ischemic attack (TIA), and cerebral infarction without residual deficits: Secondary | ICD-10-CM | POA: Diagnosis not present

## 2024-01-03 DIAGNOSIS — Z7982 Long term (current) use of aspirin: Secondary | ICD-10-CM

## 2024-01-03 DIAGNOSIS — I517 Cardiomegaly: Secondary | ICD-10-CM | POA: Diagnosis not present

## 2024-01-03 DIAGNOSIS — N179 Acute kidney failure, unspecified: Secondary | ICD-10-CM | POA: Diagnosis present

## 2024-01-03 DIAGNOSIS — E1351 Other specified diabetes mellitus with diabetic peripheral angiopathy without gangrene: Secondary | ICD-10-CM | POA: Diagnosis present

## 2024-01-03 DIAGNOSIS — R079 Chest pain, unspecified: Secondary | ICD-10-CM | POA: Diagnosis not present

## 2024-01-03 DIAGNOSIS — Z7985 Long-term (current) use of injectable non-insulin antidiabetic drugs: Secondary | ICD-10-CM

## 2024-01-03 DIAGNOSIS — J449 Chronic obstructive pulmonary disease, unspecified: Secondary | ICD-10-CM

## 2024-01-03 DIAGNOSIS — F419 Anxiety disorder, unspecified: Secondary | ICD-10-CM | POA: Diagnosis present

## 2024-01-03 DIAGNOSIS — Z833 Family history of diabetes mellitus: Secondary | ICD-10-CM

## 2024-01-03 DIAGNOSIS — Z7951 Long term (current) use of inhaled steroids: Secondary | ICD-10-CM

## 2024-01-03 DIAGNOSIS — F17210 Nicotine dependence, cigarettes, uncomplicated: Secondary | ICD-10-CM | POA: Diagnosis present

## 2024-01-03 DIAGNOSIS — R9431 Abnormal electrocardiogram [ECG] [EKG]: Secondary | ICD-10-CM | POA: Diagnosis not present

## 2024-01-03 DIAGNOSIS — Z794 Long term (current) use of insulin: Secondary | ICD-10-CM | POA: Diagnosis not present

## 2024-01-03 DIAGNOSIS — Z823 Family history of stroke: Secondary | ICD-10-CM | POA: Diagnosis not present

## 2024-01-03 DIAGNOSIS — E8770 Fluid overload, unspecified: Secondary | ICD-10-CM | POA: Diagnosis present

## 2024-01-03 DIAGNOSIS — Z79899 Other long term (current) drug therapy: Secondary | ICD-10-CM

## 2024-01-03 DIAGNOSIS — I2489 Other forms of acute ischemic heart disease: Secondary | ICD-10-CM | POA: Diagnosis present

## 2024-01-03 DIAGNOSIS — I132 Hypertensive heart and chronic kidney disease with heart failure and with stage 5 chronic kidney disease, or end stage renal disease: Secondary | ICD-10-CM | POA: Diagnosis present

## 2024-01-03 DIAGNOSIS — E1165 Type 2 diabetes mellitus with hyperglycemia: Secondary | ICD-10-CM | POA: Diagnosis present

## 2024-01-03 DIAGNOSIS — Z881 Allergy status to other antibiotic agents status: Secondary | ICD-10-CM

## 2024-01-03 DIAGNOSIS — R0600 Dyspnea, unspecified: Secondary | ICD-10-CM | POA: Diagnosis not present

## 2024-01-03 DIAGNOSIS — I5043 Acute on chronic combined systolic (congestive) and diastolic (congestive) heart failure: Secondary | ICD-10-CM | POA: Diagnosis present

## 2024-01-03 DIAGNOSIS — R0902 Hypoxemia: Secondary | ICD-10-CM | POA: Diagnosis present

## 2024-01-03 DIAGNOSIS — Z880 Allergy status to penicillin: Secondary | ICD-10-CM

## 2024-01-03 DIAGNOSIS — I700 Atherosclerosis of aorta: Secondary | ICD-10-CM | POA: Diagnosis present

## 2024-01-03 DIAGNOSIS — R0602 Shortness of breath: Secondary | ICD-10-CM | POA: Diagnosis not present

## 2024-01-03 DIAGNOSIS — I2510 Atherosclerotic heart disease of native coronary artery without angina pectoris: Secondary | ICD-10-CM | POA: Diagnosis not present

## 2024-01-03 DIAGNOSIS — E1122 Type 2 diabetes mellitus with diabetic chronic kidney disease: Secondary | ICD-10-CM | POA: Diagnosis present

## 2024-01-03 DIAGNOSIS — I509 Heart failure, unspecified: Secondary | ICD-10-CM

## 2024-01-03 LAB — CBC
HCT: 43.8 % (ref 36.0–46.0)
Hemoglobin: 14.2 g/dL (ref 12.0–15.0)
MCH: 32.3 pg (ref 26.0–34.0)
MCHC: 32.4 g/dL (ref 30.0–36.0)
MCV: 99.8 fL (ref 80.0–100.0)
Platelets: 247 10*3/uL (ref 150–400)
RBC: 4.39 MIL/uL (ref 3.87–5.11)
RDW: 11.2 % — ABNORMAL LOW (ref 11.5–15.5)
WBC: 10.8 10*3/uL — ABNORMAL HIGH (ref 4.0–10.5)
nRBC: 0 % (ref 0.0–0.2)

## 2024-01-03 LAB — COMPREHENSIVE METABOLIC PANEL WITH GFR
ALT: 18 U/L (ref 0–44)
AST: 29 U/L (ref 15–41)
Albumin: 2.4 g/dL — ABNORMAL LOW (ref 3.5–5.0)
Alkaline Phosphatase: 71 U/L (ref 38–126)
Anion gap: 10 (ref 5–15)
BUN: 25 mg/dL — ABNORMAL HIGH (ref 8–23)
CO2: 26 mmol/L (ref 22–32)
Calcium: 8.5 mg/dL — ABNORMAL LOW (ref 8.9–10.3)
Chloride: 104 mmol/L (ref 98–111)
Creatinine, Ser: 2.96 mg/dL — ABNORMAL HIGH (ref 0.44–1.00)
GFR, Estimated: 16 mL/min — ABNORMAL LOW (ref >60)
Glucose, Bld: 100 mg/dL — ABNORMAL HIGH (ref 70–99)
Potassium: 3.6 mmol/L (ref 3.5–5.1)
Sodium: 140 mmol/L (ref 135–145)
Total Bilirubin: 0.5 mg/dL (ref 0.0–1.2)
Total Protein: 5.8 g/dL — ABNORMAL LOW (ref 6.5–8.1)

## 2024-01-03 LAB — TROPONIN I (HIGH SENSITIVITY)
Troponin I (High Sensitivity): 55 ng/L — ABNORMAL HIGH (ref <18)
Troponin I (High Sensitivity): 70 ng/L — ABNORMAL HIGH (ref <18)

## 2024-01-03 LAB — BRAIN NATRIURETIC PEPTIDE: B Natriuretic Peptide: 518.1 pg/mL — ABNORMAL HIGH (ref 0.0–100.0)

## 2024-01-03 MED ORDER — FUROSEMIDE 10 MG/ML IJ SOLN
80.0000 mg | Freq: Once | INTRAMUSCULAR | Status: AC
  Administered 2024-01-03: 80 mg via INTRAVENOUS
  Filled 2024-01-03: qty 8

## 2024-01-03 MED ORDER — POTASSIUM CHLORIDE CRYS ER 20 MEQ PO TBCR
40.0000 meq | EXTENDED_RELEASE_TABLET | Freq: Once | ORAL | Status: AC
Start: 2024-01-03 — End: 2024-01-03
  Administered 2024-01-03: 40 meq via ORAL
  Filled 2024-01-03: qty 2

## 2024-01-03 NOTE — ED Provider Notes (Signed)
 [name] AT [facility] Provider Note   CSN: [id number] Arrival date & time: 01/03/24  1213     History  Chief Complaint  Patient presents with   Shortness of Breath    [name] is a 78 y.o. female.  Patient with past medical history seen for type II DM, stage IV CKD, hypertension presents to the emergency department complaining of worsening shortness of breath with some nausea.  Patient with ESRD with fistula placement 2 weeks ago on right arm.  Plan to start hemodialysis within the next 5 or so weeks.  Nephrology recommended patient come to the emergency department for evaluation due to worsening symptoms that they thought may be due to fluid overload.  Patient endorses worsening pedal edema, worsening shortness of breath.  She does endorse mild nausea.  She denies chest pain, abdominal pain, emesis, diarrhea, urinary symptoms.   Shortness of Breath      Home Medications Prior to Admission medications   Medication Sig Start Date End Date Taking? Authorizing Provider  acetaminophen (TYLENOL) 500 MG tablet Take 1,000 mg by mouth 2 (two) times daily as needed for mild pain (pain score 1-3).    [provider]  amLODipine (NORVASC) 10 MG tablet TAKE 1 TABLET BY MOUTH EVERY DAY FOR BLOOD PRESSURE Patient taking differently: Take 5 mg by mouth daily. 04/29/23   Wilkinson, Dana E, FNP  aspirin 325 MG EC tablet Take 325 mg by mouth at bedtime.    [provider]  Blood Glucose Monitoring Suppl (ONETOUCH VERIO FLEX SYSTEM) w/Device KIT USE AS ADVISED 07/20/21   Emilie Harden, MD  busPIRone (BUSPAR) 5 MG tablet TAKE 1 TABLET BY MOUTH THREE TIMES A DAY Patient taking differently: Take 5 mg by mouth 2 (two) times daily. 05/30/23   Wilkinson, Dana E, FNP  calcitRIOL (ROCALTROL) 0.25 MCG capsule Take 0.25 mcg by mouth daily. 11/30/23   [provider]  Fluticasone-Umeclidin-Vilant (TRELEGY ELLIPTA) 200-62.5-25 MCG/ACT AEPB Inhale  1 puff into the lungs daily. 07/20/23   Mannam, Praveen, MD  furosemide (LASIX) 40 MG tablet Take 80 mg by mouth 2 (two) times daily.    [provider]  glucose blood test strip Use as instructed 3x a day - One Touch Verio 06/18/22   Emilie Harden, MD  hydrALAZINE (APRESOLINE) 25 MG tablet Take 25 mg by mouth 2 (two) times daily.    [provider]  insulin aspart (NOVOLOG FLEXPEN) 100 UNIT/ML FlexPen Inject 10-14 Units into the skin 3 (three) times daily before meals. Patient taking differently: Inject 8-14 Units into the skin 3 (three) times daily as needed for high blood sugar. 02/07/23   Emilie Harden, MD  insulin degludec (TRESIBA FLEXTOUCH) 200 UNIT/ML FlexTouch Pen Inject 40 Units into the skin daily. 12/02/22   Emilie Harden, MD  meclizine (ANTIVERT) 25 MG tablet 1/2 tab up to 3 times daily for motion sickness/dizziness. Patient taking differently: Take 25 mg by mouth 3 (three) times daily as needed for dizziness. 11/10/21   Iaeger Bureau, NP  metolazone (ZAROXOLYN) 5 MG tablet Take 5 mg by mouth 3 (three) times a week.    [provider]  Omega-3 Fatty Acids (FISH OIL) 1000 MG CAPS Take 1,000 mg by mouth in the morning and at bedtime. 11/21/21   Cordie Deters, PA-C  OneTouch Delica Lancets 33G MISC Use 3x a day 06/08/21   Emilie Harden, MD  oxyCODONE-acetaminophen (PERCOCET/ROXICET) 5-325 MG tablet Take 1 tablet by mouth every 6 (six)  hours as needed. 12/21/23   Lars Mage, PA-C  rosuvastatin (CRESTOR) 20 MG tablet Take 1 tablet (20 mg total) by mouth daily. 10/10/23 10/09/24  Donell Beers, FNP  tirzepatide [facility]) 2.5 MG/0.5ML Pen Inject 2.5 mg into the skin once a week. 10/31/23   Carlus Pavlov, MD      Allergies    Ciprofloxacin, Ozempic (0.25 or 0.5 mg-dose) [semaglutide(0.25 or 0.5mg -dos)], Plavix [clopidogrel bisulfate], Amoxicillin, Ace inhibitors, Atorvastatin, Lyrica [pregabalin], and Penicillins    Review of Systems    Review of Systems  Respiratory:  Positive for shortness of breath.     Physical Exam Updated Vital Signs BP (!) 193/56   Pulse 66   Temp 98 F (36.7 C) (Oral)   Resp (!) 23   SpO2 100%  Physical Exam Vitals and nursing note reviewed.  Constitutional:      General: She is not in acute distress.    Appearance: She is well-developed.  HENT:     Head: Normocephalic and atraumatic.  Eyes:     Conjunctiva/sclera: Conjunctivae normal.  Cardiovascular:     Rate and Rhythm: Normal rate and regular rhythm.     Heart sounds: No murmur heard. Pulmonary:     Effort: Pulmonary effort is normal. No respiratory distress.     Breath sounds: Examination of the right-lower field reveals rales. Examination of the left-lower field reveals rales. Rales present.  Abdominal:     Palpations: Abdomen is soft.     Tenderness: There is no abdominal tenderness.  Musculoskeletal:        General: No swelling.     Cervical back: Neck supple.     Right lower leg: Edema present.     Left lower leg: Edema present.     Comments: 2+ pitting edema in bilateral lower extremities  Skin:    General: Skin is warm and dry.     Capillary Refill: Capillary refill takes less than 2 seconds.  Neurological:     Mental Status: She is alert.  Psychiatric:        Mood and Affect: Mood normal.     ED Results / Procedures / Treatments   Labs (all labs ordered are listed, but only abnormal results are displayed) Labs Reviewed  CBC - Abnormal; Notable for the following components:      Result Value   WBC 10.8 (*)    RDW 11.2 (*)    All other components within normal limits  COMPREHENSIVE METABOLIC PANEL WITH GFR - Abnormal; Notable for the following components:   Glucose, Bld 100 (*)    BUN 25 (*)    Creatinine, Ser 2.96 (*)    Calcium 8.5 (*)    Total Protein 5.8 (*)    Albumin 2.4 (*)    GFR, Estimated 16 (*)    All other components within normal limits  BRAIN NATRIURETIC PEPTIDE - Abnormal; Notable for  the following components:   B Natriuretic Peptide 518.1 (*)    All other components within normal limits  TROPONIN I (HIGH SENSITIVITY) - Abnormal; Notable for the following components:   Troponin I (High Sensitivity) 55 (*)    All other components within normal limits  TROPONIN I (HIGH SENSITIVITY) - Abnormal; Notable for the following components:   Troponin I (High Sensitivity) 70 (*)    All other components within normal limits    EKG EKG Interpretation Date/Time:  Tuesday January 03 2024 12:38:37 EDT Ventricular Rate:  71 PR Interval:  130 QRS Duration:  86 QT Interval:  452 QTC Calculation: 491 R Axis:   59  Text Interpretation: Normal sinus rhythm ST & T wave abnormality, consider lateral ischemia  overall similar to Dec 2023 Confirmed by Jerilynn Montenegro [phone]) on 01/03/2024 7:50:54 PM  Radiology DG Chest 2 View Result Date: 01/03/2024 CLINICAL DATA:  Chest pain and shortness of breath EXAM: CHEST - 2 VIEW COMPARISON:  Chest x-ray 08/27/2022.  CT of the chest 07/21/2023. FINDINGS: There is a calcified granuloma in the right lower lobe, unchanged. The lungs are otherwise clear. The heart is mildly enlarged. There is no pneumothorax. There is no pleural effusion. No acute fractures are seen. IMPRESSION: 1. No active cardiopulmonary disease. 2. Mild cardiomegaly. Electronically Signed   By: Tyron Gallon M.D.   On: 01/03/2024 15:53    Procedures Procedures    Medications Ordered in ED Medications  furosemide (LASIX) injection 80 mg (80 mg Intravenous Given 01/03/24 1841)  potassium chloride SA (KLOR-CON M) CR tablet 40 mEq (40 mEq Oral Given 01/03/24 1844)    ED Course/ Medical Decision Making/ A&P                                 Medical Decision Making Risk Prescription drug management. Decision regarding hospitalization.   This patient presents to the ED for concern of shortness of breath, this involves an extensive number of treatment options, and is a complaint that  carries with it a high risk of complications and morbidity.  The differential diagnosis includes fluid overload, pneumonia, ACS, others   Co morbidities that complicate the patient evaluation  ESRD, type II DM   Additional history obtained:   External records from outside source obtained and reviewed including internal medicine notes   Lab Tests:  I Ordered, and personally interpreted labs.  The pertinent results include: BNP 518.1, troponin 70 (likely due to fluid overload), creatinine 2.96 grossly at baseline   Imaging Studies ordered:  I ordered imaging studies including chest x-ray I independently visualized and interpreted imaging which showed mild cardiomegaly I agree with the radiologist interpretation   Cardiac Monitoring: / EKG:  The patient was maintained on a cardiac monitor.  I personally viewed and interpreted the cardiac monitored which showed an underlying rhythm of: Sinus rhythm   Problem List / ED Course / Critical interventions / Medication management   I ordered medication including Lasix for fluid overload, potassium for borderline hypokalemia Reevaluation of the patient after these medicines showed that the patient stayed the same I have reviewed the patients home medicines and have made adjustments as needed   Consultations Obtained:  I requested consultation with the hospitalist, Dr.Rathore, and discussed lab and imaging findings as well as pertinent plan - they recommend: admission   Social Determinants of Health:  Patient is an everyday tobacco smoker   Test / Admission - Considered:  Patient with signs of fluid overload with crackles, peripheral edema, elevated BNP.  Needs workup for possible heart failure and continue diuresis.  Plan to admit to medicine service for continued evaluation and management.         Final Clinical Impression(s) / ED Diagnoses Final diagnoses:  Dyspnea, unspecified type  Elevated brain natriuretic  peptide (BNP) level    Rx / DC Orders ED Discharge Orders     None         Delories Fetter 01/03/24 2249    Jerilynn Montenegro, MD 01/03/24 2317

## 2024-01-03 NOTE — H&P (Signed)
 History and Physical    [name] ZOX:[id number] DOB: [birth year] DOA: 01/03/2024  PCP: Paseda, Folashade R, FNP  Patient coming from: Home  Chief Complaint: Shortness of breath  HPI: [name] is a 78 y.o. female with medical history significant of CKD stage V, insulin-dependent type 2 diabetes, PVD status post stents and femoropopliteal bypass, CVA, hyperlipidemia, GERD, anxiety, depression presenting with a chief complaint of shortness of breath.  Patient is reporting progressively worsening dyspnea on exertion and bilateral lower extremity edema for the past few weeks.  She recently had right brachiocephalic fistula placed on 12/21/2023 in anticipation of needing dialysis in the near future.  She was sent to the ED today by her nephrologist due to concern for fluid overload.  She is taking Lasix 80 mg twice daily at home.  Denies chest pain.  ED Course: Blood pressure elevated with systolic up 180-200.  Not tachycardic or hypoxic.  Labs notable for potassium 3.6, bicarb 26, BUN 25, creatinine 2.9 (previously 3.7 on 12/21/2023), troponin 55> 70, BNP 518.  Chest x-ray showing mild cardiomegaly and no active cardiopulmonary disease.  EKG showing sinus rhythm and no acute ischemic changes, QTc 491. Patient was given IV Lasix 80 mg and oral potassium 40 mg in the ED.  Review of Systems:  Review of Systems  All other systems reviewed and are negative.   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,  "small"   Anxiety    Arthritis    Cataract    bilateral - surgery   CKD (chronic kidney disease), stage IV (HCC)    followed by [facility]   Colon polyps    Depression    Diabetes mellitus 1998   type 2   Diverticulosis    Emphysema of lung (HCC)    "no one has evry told me that"   ESOPHAGEAL STRICTURE 08/27/2008   Family history of adverse reaction to anesthesia     daughter n/v   GERD (gastroesophageal reflux disease)    Glaucoma    bilateral   Hemorrhoids    hx   Hiatal hernia    Hyperlipidemia    Hypertension 2000   Migraines    Neuropathy    fingers and toes   Peripheral vascular disease (HCC)    Phlebitis    30 years ago  left leg   Stroke [facility])    multiple mini strokes ( brain aneurysm ).  Right side weaker.    Past Surgical History:  Procedure Laterality Date   ABDOMINAL AORTAGRAM N/A 01/04/2012   Procedure: ABDOMINAL AORTAGRAM;  Surgeon: Margherita Shell, MD;  Location: [facility] CATH LAB;  Service: Cardiovascular;  Laterality: N/A;   ABDOMINAL AORTAGRAM N/A 08/15/2012   Procedure: ABDOMINAL Tommi Fraise;  Surgeon: Margherita Shell, MD;  Location: [facility] CATH LAB;  Service: Cardiovascular;  Laterality: N/A;   ABDOMINAL AORTOGRAM W/LOWER EXTREMITY Left 11/17/2021   Procedure: ABDOMINAL AORTOGRAM W/LOWER EXTREMITY;  Surgeon: Margherita Shell, MD;  Location: [facility] INVASIVE CV LAB;  Service: Cardiovascular;  Laterality: Left;   ABDOMINAL AORTOGRAM W/LOWER EXTREMITY N/A 03/09/2022   Procedure: ABDOMINAL AORTOGRAM W/LOWER EXTREMITY;  Surgeon: Margherita Shell, MD;  Location: [facility] INVASIVE CV LAB;  Service: Cardiovascular;  Laterality: N/A;   ABDOMINAL AORTOGRAM W/LOWER EXTREMITY N/A 03/15/2023   Procedure: ABDOMINAL AORTOGRAM W/LOWER EXTREMITY;  Surgeon: Margherita Shell, MD;  Location: [facility] INVASIVE CV  LAB;  Service: Cardiovascular;  Laterality: N/A;   ABDOMINAL HYSTERECTOMY  1984   AV FISTULA PLACEMENT Right 12/21/2023   Procedure: RIGHT ARM BRACHIOCEPHALIC ARTERIOVENOUS (AV) FISTULA CREATION;  Surgeon: Nada Libman, MD;  Location: [facility] OR;  Service: Vascular;  Laterality: Right;   BREAST SURGERY     cataract surgery Right 10/22/2015   cataract surgery Left 11/05/2015   CESAREAN SECTION     CHOLECYSTECTOMY     COLONOSCOPY  07/2010   DENTAL SURGERY  05/05/2012   left lower    ESOPHAGOGASTRODUODENOSCOPY  07/2010   EYE SURGERY  07/2013   Laser-Glaucoma    FEMORAL ARTERY STENT  05/11/2011   Left superficial femoral and popliteal artery   FEMORAL-POPLITEAL BYPASS GRAFT Left 11/18/2021   Procedure: LEFT FEMORAL-POPLITEAL ARTERY BYPASS GRAFT;  Surgeon: Nada Libman, MD;  Location: [facility] OR;  Service: Vascular;  Laterality: Left;   HERNIA REPAIR     times two   KNEE SURGERY     LOWER EXTREMITY ANGIOGRAM Left 08/15/2012   Procedure: LOWER EXTREMITY ANGIOGRAM;  Surgeon: Nada Libman, MD;  Location: [facility] CATH LAB;  Service: Cardiovascular;  Laterality: Left;  lt leg angio   PERIPHERAL VASCULAR BALLOON ANGIOPLASTY Left 03/09/2022   Procedure: PERIPHERAL VASCULAR BALLOON ANGIOPLASTY;  Surgeon: Nada Libman, MD;  Location: [facility] INVASIVE CV LAB;  Service: Cardiovascular;  Laterality: Left;   PERIPHERAL VASCULAR BALLOON ANGIOPLASTY Left 03/15/2023   Procedure: PERIPHERAL VASCULAR BALLOON ANGIOPLASTY;  Surgeon: Nada Libman, MD;  Location: [facility] INVASIVE CV LAB;  Service: Cardiovascular;  Laterality: Left;   rotator cuff surgery     THYROID SURGERY       reports that she has been smoking cigarettes. She has a 20 pack-year smoking history. She has never been exposed to tobacco smoke. She has never used smokeless tobacco. She reports that she does not drink alcohol and does not use drugs.  Allergies  Allergen Reactions   Ciprofloxacin Swelling   Ozempic (0.25 Or 0.5 Mg-Dose) [Semaglutide(0.25 Or 0.5mg -Dos)] Nausea And Vomiting   Plavix [Clopidogrel Bisulfate] Palpitations   Amoxicillin Itching and Swelling    FACE & EYES SWELL   Ace Inhibitors Cough    Sore throat   Atorvastatin     myalgias   Lyrica [Pregabalin] Itching and Nausea Only     gain weight   Penicillins Other (See Comments)    Pt unsure if there is an allergic reaction     Family History  Problem Relation Age of Onset   CAD Mother 23       Died of MI   Hypertension Mother    Heart attack Mother    Heart disease Mother    CAD Father 84       Died of MI   Heart disease  Father    Heart attack Father    CAD Brother 17       Two brothers died of MI   Heart attack Brother    Heart disease Brother        Amputation   Diabetes Sister    Hypertension Sister    Heart attack Brother    Heart disease Brother    Stroke Sister    Colon cancer Neg Hx    Stomach cancer Neg Hx    Esophageal cancer Neg Hx     Prior to Admission medications   Medication Sig Start Date End Date Taking? Authorizing Provider  acetaminophen (TYLENOL) 500 MG tablet Take 1,000 mg by mouth 2 (two)  times daily as needed for mild pain (pain score 1-3).    [provider]  amLODipine (NORVASC) 10 MG tablet TAKE 1 TABLET BY MOUTH EVERY DAY FOR BLOOD PRESSURE Patient taking differently: Take 5 mg by mouth daily. 04/29/23   [name], FNP  aspirin 325 MG EC tablet Take 325 mg by mouth at bedtime.    [provider]  Blood Glucose Monitoring Suppl (ONETOUCH VERIO FLEX SYSTEM) w/Device KIT USE AS ADVISED 07/20/21   Carlus Pavlov, MD  busPIRone (BUSPAR) 5 MG tablet TAKE 1 TABLET BY MOUTH THREE TIMES A DAY Patient taking differently: Take 5 mg by mouth 2 (two) times daily. 05/30/23   [name], FNP  calcitRIOL (ROCALTROL) 0.25 MCG capsule Take 0.25 mcg by mouth daily. 11/30/23   [provider]  Fluticasone-Umeclidin-Vilant (TRELEGY ELLIPTA) 200-62.5-25 MCG/ACT AEPB Inhale 1 puff into the lungs daily. 07/20/23   Mannam, Colbert Coyer, MD  furosemide (LASIX) 40 MG tablet Take 80 mg by mouth 2 (two) times daily.    [provider]  glucose blood test strip Use as instructed 3x a day - One Touch Verio 06/18/22   Carlus Pavlov, MD  hydrALAZINE (APRESOLINE) 25 MG tablet Take 25 mg by mouth 2 (two) times daily.    [provider]  insulin aspart (NOVOLOG FLEXPEN) 100 UNIT/ML FlexPen Inject 10-14 Units into the skin 3 (three) times daily before meals. Patient taking differently: Inject 8-14 Units into the skin 3 (three) times daily as needed for high  blood sugar. 02/07/23   Carlus Pavlov, MD  insulin degludec (TRESIBA FLEXTOUCH) 200 UNIT/ML FlexTouch Pen Inject 40 Units into the skin daily. 12/02/22   Carlus Pavlov, MD  meclizine (ANTIVERT) 25 MG tablet 1/2 tab up to 3 times daily for motion sickness/dizziness. Patient taking differently: Take 25 mg by mouth 3 (three) times daily as needed for dizziness. 11/10/21   Judd Gaudier, NP  metolazone (ZAROXOLYN) 5 MG tablet Take 5 mg by mouth 3 (three) times a week.    [provider]  Omega-3 Fatty Acids (FISH OIL) 1000 MG CAPS Take 1,000 mg by mouth in the morning and at bedtime. 11/21/21   Emilie Rutter, PA-C  OneTouch Delica Lancets 33G MISC Use 3x a day 06/08/21   Carlus Pavlov, MD  oxyCODONE-acetaminophen (PERCOCET/ROXICET) 5-325 MG tablet Take 1 tablet by mouth every 6 (six) hours as needed. 12/21/23   Lars Mage, PA-C  rosuvastatin (CRESTOR) 20 MG tablet Take 1 tablet (20 mg total) by mouth daily. 10/10/23 10/09/24  Donell Beers, FNP  tirzepatide [facility]) 2.5 MG/0.5ML Pen Inject 2.5 mg into the skin once a week. 10/31/23   Carlus Pavlov, MD    Physical Exam: Vitals:   01/03/24 1528 01/03/24 1900 01/03/24 1915 01/03/24 2000  BP: (!) 178/54 (!) 206/68 (!) 185/55 (!) 193/56  Pulse: 65 65 63 66  Resp: 20   (!) 23  Temp: 98.4 F (36.9 C)   98 F (36.7 C)  TempSrc: Oral   Oral  SpO2: 93% 99% 97% 100%    Physical Exam Vitals reviewed.  Constitutional:      General: She is not in acute distress. HENT:     Head: Normocephalic and atraumatic.  Eyes:     Extraocular Movements: Extraocular movements intact.  Cardiovascular:     Rate and Rhythm: Normal rate and regular rhythm.     Pulses: Normal pulses.     Heart sounds: Murmur heard.  Pulmonary:     Effort: Pulmonary  effort is normal. No respiratory distress.     Breath sounds: Rales present. No wheezing.     Comments: Mild bibasilar rales Abdominal:     General: Bowel sounds are normal. There is  no distension.     Palpations: Abdomen is soft.     Tenderness: There is no abdominal tenderness. There is no guarding.  Musculoskeletal:     Cervical back: Normal range of motion.     Right lower leg: Edema present.     Left lower leg: Edema present.     Comments: 2+ pitting edema of bilateral lower extremities  Skin:    General: Skin is warm and dry.  Neurological:     General: No focal deficit present.     Mental Status: She is alert and oriented to person, place, and time.     Labs on Admission: I have personally reviewed following labs and imaging studies  CBC: Recent Labs  Lab 01/03/24 1300  WBC 10.8*  HGB 14.2  HCT 43.8  MCV 99.8  PLT 247   Basic Metabolic Panel: Recent Labs  Lab 01/03/24 1300  NA 140  K 3.6  CL 104  CO2 26  GLUCOSE 100*  BUN 25*  CREATININE 2.96*  CALCIUM 8.5*   GFR: Estimated Creatinine Clearance: 17.9 mL/min (A) (by C-G formula based on SCr of 2.96 mg/dL (H)). Liver Function Tests: Recent Labs  Lab 01/03/24 1300  AST 29  ALT 18  ALKPHOS 71  BILITOT 0.5  PROT 5.8*  ALBUMIN 2.4*   No results for input(s): "LIPASE", "AMYLASE" in the last 168 hours. No results for input(s): "AMMONIA" in the last 168 hours. Coagulation Profile: No results for input(s): "INR", "PROTIME" in the last 168 hours. Cardiac Enzymes: No results for input(s): "CKTOTAL", "CKMB", "CKMBINDEX", "TROPONINI" in the last 168 hours. BNP (last 3 results) No results for input(s): "PROBNP" in the last 8760 hours. HbA1C: No results for input(s): "HGBA1C" in the last 72 hours. CBG: No results for input(s): "GLUCAP" in the last 168 hours. Lipid Profile: No results for input(s): "CHOL", "HDL", "LDLCALC", "TRIG", "CHOLHDL", "LDLDIRECT" in the last 72 hours. Thyroid Function Tests: No results for input(s): "TSH", "T4TOTAL", "FREET4", "T3FREE", "THYROIDAB" in the last 72 hours. Anemia Panel: No results for input(s): "VITAMINB12", "FOLATE", "FERRITIN", "TIBC", "IRON",  "RETICCTPCT" in the last 72 hours. Urine analysis:    Component Value Date/Time   COLORURINE YELLOW 08/08/2023 1506   APPEARANCEUR CLEAR 08/08/2023 1506   LABSPEC 1.027 08/08/2023 1506   PHURINE < OR = 5.0 08/08/2023 1506   GLUCOSEU 3+ (A) 08/08/2023 1506   GLUCOSEU 500 (?) 01/30/2010 1056   HGBUR NEGATIVE 08/08/2023 1506   BILIRUBINUR NEGATIVE 08/28/2022 0611   KETONESUR NEGATIVE 08/08/2023 1506   PROTEINUR 3+ (A) 08/08/2023 1506   UROBILINOGEN 0.2 10/04/2014 1807   NITRITE NEGATIVE 08/08/2023 1506   LEUKOCYTESUR NEGATIVE 08/08/2023 1506    Radiological Exams on Admission: DG Chest 2 View Result Date: 01/03/2024 CLINICAL DATA:  Chest pain and shortness of breath EXAM: CHEST - 2 VIEW COMPARISON:  Chest x-ray 08/27/2022.  CT of the chest 07/21/2023. FINDINGS: There is a calcified granuloma in the right lower lobe, unchanged. The lungs are otherwise clear. The heart is mildly enlarged. There is no pneumothorax. There is no pleural effusion. No acute fractures are seen. IMPRESSION: 1. No active cardiopulmonary disease. 2. Mild cardiomegaly. Electronically Signed   By: Darliss Cheney M.D.   On: 01/03/2024 15:53    Assessment and Plan  Fluid overload CKD  stage IV-V Acute on chronic HFpEF Although chest x-ray is not showing pulmonary edema, patient appears volume overloaded on exam with rales and bilateral lower extremity edema.  BNP 518. She recently had right brachiocephalic fistula placed on 12/21/2023.  Creatinine 2.9 (previously 3.7 on 12/21/2023), GFR 16.  Last echo done in December 2023 showing EF 60 to 65%, grade 1 diastolic dysfunction. She was given a dose of IV Lasix 80 mg in the ED.  Consult nephrology in the morning.  Repeat echocardiogram ordered.  Monitor intake and output, daily weights, fluid restriction.  Hypertensive urgency Due to fluid overload.  IV Lasix given.  Pharmacy med rec pending.  IV hydralazine PRN SBP >160.  Elevated troponin Likely due to demand ischemia.  EKG  without acute ischemic changes and patient is not endorsing chest pain.  Troponin 55>70, continue to trend.  QT prolongation QTc 491.  Continue cardiac monitoring.  Monitor potassium and magnesium levels.  Avoid QT prolonging drugs.  Insulin-dependent type 2 diabetes A1c 7.3 on 10/31/2023.  Placed on very sensitive sliding scale insulin ACHS.  Resume home basal insulin after pharmacy med rec is done.  PVD History of CVA Hyperlipidemia GERD Anxiety/depression Pharmacy med rec pending.  DVT prophylaxis: SQ Heparin Code Status: Full Code (discussed with the patient) Family Communication: No family available at this time. Level of care: Progressive Care Unit Admission status: It is my clinical opinion that referral for OBSERVATION is reasonable and necessary in this patient based on the above information provided. The aforementioned taken together are felt to place the patient at high risk for further clinical deterioration. However, it is anticipated that the patient may be medically stable for discharge from the hospital within 24 to 48 hours.  Juliette Oh MD [facility]  If 7PM-7AM, please contact night-coverage [email or web address]  01/03/2024, 11:55 PM

## 2024-01-03 NOTE — ED Triage Notes (Signed)
 Kidney MD sent to ED for evaluation End stage kidney failure Last few days nauseas and SOB Got fistula 2 wks ago in R arm to start dialysis

## 2024-01-03 NOTE — ED Provider Triage Note (Signed)
 Emergency Medicine Provider Triage Evaluation Note  [name] , a 78 y.o. female  was evaluated in triage.  Pt complains of shortness of breath, chest pain. Symptoms ongoing for the past few days. Also with nausea, and loss of appetite. Hx ESRD not on dialysis, recently had fistula placed with anticipation for needing dialysis soon. Called nephrology who told her to come here with concern that she may need dialysis. Her legs are slightly swollen but have improved recently. Does endorse chest pain that is substernal. Also slight cough.   Review of Systems  Positive:  Negative:   Physical Exam  BP (!) 185/70 (BP Location: Left Arm)   Pulse 73   Temp 98.6 F (37 C)   Resp (!) 22   SpO2 98%  Gen:   Awake, no distress   Resp:  Normal effort MSK:   Moves extremities without difficulty  Other:  1+ pitting edema bilaterally.   Medical Decision Making  Medically screening exam initiated at 12:55 PM.  Appropriate orders placed.  [name] was informed that the remainder of the evaluation will be completed by another provider, this initial triage assessment does not replace that evaluation, and the importance of remaining in the ED until their evaluation is complete.  Work-up initiated.   Sherra Dk, PA-C 01/03/24 1301

## 2024-01-03 NOTE — H&P (Incomplete)
 History and Physical    [name] UXN:[id number] DOB: 12/12/1945 DOA: 01/03/2024  PCP: Paseda, Folashade R, FNP  Patient coming from: Home  Chief Complaint: Shortness of breath  HPI: [name] is a 78 y.o. female with medical history significant of ESRD not on dialysis yet, insulin-dependent type 2 diabetes, PVD status post stents and femoropopliteal bypass, CVA, CKD stage IIIb, hyperlipidemia, GERD, anxiety, depression presenting with a chief complaint of shortness of breath.  Patient recently had fistula placed with anticipation for needing dialysis soon.  ED Course: ***  Review of Systems:  ROS  Past Medical History:  Diagnosis Date  . Abnormal findings on esophagogastroduodenoscopy (EGD) 07/2010  . Allergy   . 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 Anxiety   . Arthritis   . Cataract    bilateral - surgery  . CKD (chronic kidney disease), stage IV (HCC)    followed by [facility]  . Colon polyps   . Depression   . Diabetes mellitus 1998   type 2  . Diverticulosis   . Emphysema of lung (HCC)    "no one has evry told me that"  . ESOPHAGEAL STRICTURE 08/27/2008  . Family history of adverse reaction to anesthesia    daughter n/v  . GERD (gastroesophageal reflux disease)   . Glaucoma    bilateral  . Hemorrhoids    hx  . Hiatal hernia   . Hyperlipidemia   . Hypertension 2000  . Migraines   . Neuropathy    fingers and toes  . Peripheral vascular disease (HCC)   . Phlebitis    30 years ago  left leg  . Stroke [facility])    multiple mini strokes ( brain aneurysm ).  Right side weaker.    Past Surgical History:  Procedure Laterality Date  . ABDOMINAL AORTAGRAM N/A 01/04/2012   Procedure: ABDOMINAL AORTAGRAM;  Surgeon: Margherita Shell, MD;  Location: [facility] CATH LAB;  Service: Cardiovascular;  Laterality: N/A;  . ABDOMINAL AORTAGRAM N/A 08/15/2012   Procedure: ABDOMINAL Tommi Fraise;  Surgeon: Margherita Shell, MD;  Location: [facility] CATH LAB;  Service:  Cardiovascular;  Laterality: N/A;  . ABDOMINAL AORTOGRAM W/LOWER EXTREMITY Left 11/17/2021   Procedure: ABDOMINAL AORTOGRAM W/LOWER EXTREMITY;  Surgeon: Margherita Shell, MD;  Location: [facility] INVASIVE CV LAB;  Service: Cardiovascular;  Laterality: Left;  . ABDOMINAL AORTOGRAM W/LOWER EXTREMITY N/A 03/09/2022   Procedure: ABDOMINAL AORTOGRAM W/LOWER EXTREMITY;  Surgeon: Margherita Shell, MD;  Location: [facility] INVASIVE CV LAB;  Service: Cardiovascular;  Laterality: N/A;  . ABDOMINAL AORTOGRAM W/LOWER EXTREMITY N/A 03/15/2023   Procedure: ABDOMINAL AORTOGRAM W/LOWER EXTREMITY;  Surgeon: Margherita Shell, MD;  Location: [facility] INVASIVE CV LAB;  Service: Cardiovascular;  Laterality: N/A;  . ABDOMINAL HYSTERECTOMY  1984  . AV FISTULA PLACEMENT Right 12/21/2023   Procedure: RIGHT ARM BRACHIOCEPHALIC ARTERIOVENOUS (AV) FISTULA CREATION;  Surgeon: Margherita Shell, MD;  Location: [facility] OR;  Service: Vascular;  Laterality: Right;  . BREAST SURGERY    . cataract surgery Right 10/22/2015  . cataract surgery Left 11/05/2015  . CESAREAN SECTION    . CHOLECYSTECTOMY    . COLONOSCOPY  07/2010  . DENTAL SURGERY  05/05/2012   left lower   . ESOPHAGOGASTRODUODENOSCOPY  07/2010  . EYE SURGERY  07/2013   Laser-Glaucoma  . FEMORAL ARTERY STENT  05/11/2011   Left superficial femoral and popliteal artery  . FEMORAL-POPLITEAL BYPASS GRAFT Left 11/18/2021   Procedure: LEFT FEMORAL-POPLITEAL ARTERY BYPASS  GRAFT;  Surgeon: Nada Libman, MD;  Location: [facility] OR;  Service: Vascular;  Laterality: Left;  . HERNIA REPAIR     times two  . KNEE SURGERY    . LOWER EXTREMITY ANGIOGRAM Left 08/15/2012   Procedure: LOWER EXTREMITY ANGIOGRAM;  Surgeon: Nada Libman, MD;  Location: [facility] CATH LAB;  Service: Cardiovascular;  Laterality: Left;  lt leg angio  . PERIPHERAL VASCULAR BALLOON ANGIOPLASTY Left 03/09/2022   Procedure: PERIPHERAL VASCULAR BALLOON ANGIOPLASTY;  Surgeon: Nada Libman, MD;  Location: [facility] INVASIVE CV LAB;  Service:  Cardiovascular;  Laterality: Left;  . PERIPHERAL VASCULAR BALLOON ANGIOPLASTY Left 03/15/2023   Procedure: PERIPHERAL VASCULAR BALLOON ANGIOPLASTY;  Surgeon: Nada Libman, MD;  Location: [facility] INVASIVE CV LAB;  Service: Cardiovascular;  Laterality: Left;  . rotator cuff surgery    . THYROID SURGERY       reports that she has been smoking cigarettes. She has a 20 pack-year smoking history. She has never been exposed to tobacco smoke. She has never used smokeless tobacco. She reports that she does not drink alcohol and does not use drugs.  Allergies  Allergen Reactions  . Ciprofloxacin Swelling  . Ozempic (0.25 Or 0.5 Mg-Dose) [Semaglutide(0.25 Or 0.5mg -Dos)] Nausea And Vomiting  . Plavix [Clopidogrel Bisulfate] Palpitations  . Amoxicillin Itching and Swelling    FACE & EYES SWELL  . Ace Inhibitors Cough    Sore throat  . Atorvastatin     myalgias  . Lyrica [Pregabalin] Itching and Nausea Only     gain weight  . Penicillins Other (See Comments)    Pt unsure if there is an allergic reaction     Family History  Problem Relation Age of Onset  . CAD Mother 19       Died of MI  . Hypertension Mother   . Heart attack Mother   . Heart disease Mother   . CAD Father 73       Died of MI  . Heart disease Father   . Heart attack Father   . CAD Brother 54       Two brothers died of MI  . Heart attack Brother   . Heart disease Brother        Amputation  . Diabetes Sister   . Hypertension Sister   . Heart attack Brother   . Heart disease Brother   . Stroke Sister   . Colon cancer Neg Hx   . Stomach cancer Neg Hx   . Esophageal cancer Neg Hx     Prior to Admission medications   Medication Sig Start Date End Date Taking? Authorizing Provider  acetaminophen (TYLENOL) 500 MG tablet Take 1,000 mg by mouth 2 (two) times daily as needed for mild pain (pain score 1-3).    [provider]  amLODipine (NORVASC) 10 MG tablet TAKE 1 TABLET BY MOUTH EVERY DAY FOR BLOOD  PRESSURE Patient taking differently: Take 5 mg by mouth daily. 04/29/23   [name], FNP  aspirin 325 MG EC tablet Take 325 mg by mouth at bedtime.    [provider]  Blood Glucose Monitoring Suppl (ONETOUCH VERIO FLEX SYSTEM) w/Device KIT USE AS ADVISED 07/20/21   Carlus Pavlov, MD  busPIRone (BUSPAR) 5 MG tablet TAKE 1 TABLET BY MOUTH THREE TIMES A DAY Patient taking differently: Take 5 mg by mouth 2 (two) times daily. 05/30/23   [name], FNP  calcitRIOL (ROCALTROL) 0.25 MCG capsule Take 0.25 mcg by mouth daily. 11/30/23  [provider]  Fluticasone-Umeclidin-Vilant (TRELEGY ELLIPTA) 200-62.5-25 MCG/ACT AEPB Inhale 1 puff into the lungs daily. 07/20/23   Mannam, Praveen, MD  furosemide (LASIX) 40 MG tablet Take 80 mg by mouth 2 (two) times daily.    [provider]  glucose blood test strip Use as instructed 3x a day - One Touch Verio 06/18/22   Emilie Harden, MD  hydrALAZINE (APRESOLINE) 25 MG tablet Take 25 mg by mouth 2 (two) times daily.    [provider]  insulin aspart (NOVOLOG FLEXPEN) 100 UNIT/ML FlexPen Inject 10-14 Units into the skin 3 (three) times daily before meals. Patient taking differently: Inject 8-14 Units into the skin 3 (three) times daily as needed for high blood sugar. 02/07/23   Emilie Harden, MD  insulin degludec (TRESIBA FLEXTOUCH) 200 UNIT/ML FlexTouch Pen Inject 40 Units into the skin daily. 12/02/22   Emilie Harden, MD  meclizine (ANTIVERT) 25 MG tablet 1/2 tab up to 3 times daily for motion sickness/dizziness. Patient taking differently: Take 25 mg by mouth 3 (three) times daily as needed for dizziness. 11/10/21   Marienville Bureau, NP  metolazone (ZAROXOLYN) 5 MG tablet Take 5 mg by mouth 3 (three) times a week.    [provider]  Omega-3 Fatty Acids (FISH OIL) 1000 MG CAPS Take 1,000 mg by mouth in the morning and at bedtime. 11/21/21   Cordie Deters, PA-C  OneTouch Delica Lancets 33G MISC  Use 3x a day 06/08/21   Emilie Harden, MD  oxyCODONE-acetaminophen (PERCOCET/ROXICET) 5-325 MG tablet Take 1 tablet by mouth every 6 (six) hours as needed. 12/21/23   Butch Cashing, PA-C  rosuvastatin (CRESTOR) 20 MG tablet Take 1 tablet (20 mg total) by mouth daily. 10/10/23 10/09/24  Paseda, Folashade R, FNP  tirzepatide [facility]) 2.5 MG/0.5ML Pen Inject 2.5 mg into the skin once a week. 10/31/23   Emilie Harden, MD    Physical Exam: Vitals:   01/03/24 1528 01/03/24 1900 01/03/24 1915 01/03/24 2000  BP: (!) 178/54 (!) 206/68 (!) 185/55 (!) 193/56  Pulse: 65 65 63 66  Resp: 20   (!) 23  Temp: 98.4 F (36.9 C)   98 F (36.7 C)  TempSrc: Oral   Oral  SpO2: 93% 99% 97% 100%    Physical Exam  Labs on Admission: I have personally reviewed following labs and imaging studies  CBC: Recent Labs  Lab 01/03/24 1300  WBC 10.8*  HGB 14.2  HCT 43.8  MCV 99.8  PLT 247   Basic Metabolic Panel: Recent Labs  Lab 01/03/24 1300  NA 140  K 3.6  CL 104  CO2 26  GLUCOSE 100*  BUN 25*  CREATININE 2.96*  CALCIUM 8.5*   GFR: Estimated Creatinine Clearance: 17.9 mL/min (A) (by C-G formula based on SCr of 2.96 mg/dL (H)). Liver Function Tests: Recent Labs  Lab 01/03/24 1300  AST 29  ALT 18  ALKPHOS 71  BILITOT 0.5  PROT 5.8*  ALBUMIN 2.4*   No results for input(s): "LIPASE", "AMYLASE" in the last 168 hours. No results for input(s): "AMMONIA" in the last 168 hours. Coagulation Profile: No results for input(s): "INR", "PROTIME" in the last 168 hours. Cardiac Enzymes: No results for input(s): "CKTOTAL", "CKMB", "CKMBINDEX", "TROPONINI" in the last 168 hours. BNP (last 3 results) No results for input(s): "PROBNP" in the last 8760 hours. HbA1C: No results for input(s): "HGBA1C" in the last 72 hours. CBG: No results for input(s): "GLUCAP" in the last 168 hours. Lipid Profile: No results for  input(s): "CHOL", "HDL", "LDLCALC", "TRIG", "CHOLHDL", "LDLDIRECT" in the last 72  hours. Thyroid Function Tests: No results for input(s): "TSH", "T4TOTAL", "FREET4", "T3FREE", "THYROIDAB" in the last 72 hours. Anemia Panel: No results for input(s): "VITAMINB12", "FOLATE", "FERRITIN", "TIBC", "IRON", "RETICCTPCT" in the last 72 hours. Urine analysis:    Component Value Date/Time   COLORURINE YELLOW 08/08/2023 1506   APPEARANCEUR CLEAR 08/08/2023 1506   LABSPEC 1.027 08/08/2023 1506   PHURINE < OR = 5.0 08/08/2023 1506   GLUCOSEU 3+ (A) 08/08/2023 1506   GLUCOSEU 500 (?) 01/30/2010 1056   HGBUR NEGATIVE 08/08/2023 1506   BILIRUBINUR NEGATIVE 08/28/2022 0611   KETONESUR NEGATIVE 08/08/2023 1506   PROTEINUR 3+ (A) 08/08/2023 1506   UROBILINOGEN 0.2 10/04/2014 1807   NITRITE NEGATIVE 08/08/2023 1506   LEUKOCYTESUR NEGATIVE 08/08/2023 1506    Radiological Exams on Admission: DG Chest 2 View Result Date: 01/03/2024 CLINICAL DATA:  Chest pain and shortness of breath EXAM: CHEST - 2 VIEW COMPARISON:  Chest x-ray 08/27/2022.  CT of the chest 07/21/2023. FINDINGS: There is a calcified granuloma in the right lower lobe, unchanged. The lungs are otherwise clear. The heart is mildly enlarged. There is no pneumothorax. There is no pleural effusion. No acute fractures are seen. IMPRESSION: 1. No active cardiopulmonary disease. 2. Mild cardiomegaly. Electronically Signed   By: Tyron Gallon M.D.   On: 01/03/2024 15:53    EKG: Independently reviewed. ***  Assessment and Plan  DVT prophylaxis: {Blank single:19197::"Lovenox","SQ Heparin","IV heparin gtt","Xarelto","Eliquis","Coumadin","SCDs","***"} Code Status: {Blank single:19197::"Full Code","DNR","DNR/DNI","Comfort Care","***"} Family Communication: ***  Consults called: ***  Level of care: {Blank single:19197::"Med-Surg","Telemetry bed","Progressive Care Unit","Step Down Unit"} Admission status: *** Time Spent: 75+ minutes***  Juliette Oh MD [facility]  If 7PM-7AM, please contact  night-coverage [email or web address]  01/03/2024, 11:55 PM

## 2024-01-04 ENCOUNTER — Observation Stay (HOSPITAL_COMMUNITY)

## 2024-01-04 ENCOUNTER — Encounter (HOSPITAL_COMMUNITY): Payer: Self-pay | Admitting: Internal Medicine

## 2024-01-04 DIAGNOSIS — E785 Hyperlipidemia, unspecified: Secondary | ICD-10-CM | POA: Diagnosis present

## 2024-01-04 DIAGNOSIS — Z79899 Other long term (current) drug therapy: Secondary | ICD-10-CM | POA: Diagnosis not present

## 2024-01-04 DIAGNOSIS — E1169 Type 2 diabetes mellitus with other specified complication: Secondary | ICD-10-CM | POA: Diagnosis present

## 2024-01-04 DIAGNOSIS — R7989 Other specified abnormal findings of blood chemistry: Secondary | ICD-10-CM

## 2024-01-04 DIAGNOSIS — Z8673 Personal history of transient ischemic attack (TIA), and cerebral infarction without residual deficits: Secondary | ICD-10-CM

## 2024-01-04 DIAGNOSIS — I700 Atherosclerosis of aorta: Secondary | ICD-10-CM | POA: Diagnosis present

## 2024-01-04 DIAGNOSIS — I2489 Other forms of acute ischemic heart disease: Secondary | ICD-10-CM | POA: Diagnosis present

## 2024-01-04 DIAGNOSIS — I1A0 Resistant hypertension: Secondary | ICD-10-CM | POA: Diagnosis present

## 2024-01-04 DIAGNOSIS — Z794 Long term (current) use of insulin: Secondary | ICD-10-CM | POA: Diagnosis not present

## 2024-01-04 DIAGNOSIS — I10 Essential (primary) hypertension: Secondary | ICD-10-CM | POA: Diagnosis not present

## 2024-01-04 DIAGNOSIS — I2510 Atherosclerotic heart disease of native coronary artery without angina pectoris: Secondary | ICD-10-CM

## 2024-01-04 DIAGNOSIS — E1351 Other specified diabetes mellitus with diabetic peripheral angiopathy without gangrene: Secondary | ICD-10-CM | POA: Diagnosis not present

## 2024-01-04 DIAGNOSIS — F17210 Nicotine dependence, cigarettes, uncomplicated: Secondary | ICD-10-CM | POA: Diagnosis present

## 2024-01-04 DIAGNOSIS — E1122 Type 2 diabetes mellitus with diabetic chronic kidney disease: Secondary | ICD-10-CM | POA: Diagnosis present

## 2024-01-04 DIAGNOSIS — I5033 Acute on chronic diastolic (congestive) heart failure: Secondary | ICD-10-CM | POA: Diagnosis not present

## 2024-01-04 DIAGNOSIS — I132 Hypertensive heart and chronic kidney disease with heart failure and with stage 5 chronic kidney disease, or end stage renal disease: Secondary | ICD-10-CM | POA: Diagnosis present

## 2024-01-04 DIAGNOSIS — E1165 Type 2 diabetes mellitus with hyperglycemia: Secondary | ICD-10-CM | POA: Diagnosis present

## 2024-01-04 DIAGNOSIS — N185 Chronic kidney disease, stage 5: Secondary | ICD-10-CM

## 2024-01-04 DIAGNOSIS — F419 Anxiety disorder, unspecified: Secondary | ICD-10-CM | POA: Diagnosis present

## 2024-01-04 DIAGNOSIS — J439 Emphysema, unspecified: Secondary | ICD-10-CM | POA: Diagnosis present

## 2024-01-04 DIAGNOSIS — Z823 Family history of stroke: Secondary | ICD-10-CM | POA: Diagnosis not present

## 2024-01-04 DIAGNOSIS — E1151 Type 2 diabetes mellitus with diabetic peripheral angiopathy without gangrene: Secondary | ICD-10-CM | POA: Diagnosis present

## 2024-01-04 DIAGNOSIS — R0602 Shortness of breath: Secondary | ICD-10-CM | POA: Diagnosis not present

## 2024-01-04 DIAGNOSIS — I509 Heart failure, unspecified: Secondary | ICD-10-CM

## 2024-01-04 DIAGNOSIS — I5043 Acute on chronic combined systolic (congestive) and diastolic (congestive) heart failure: Secondary | ICD-10-CM | POA: Diagnosis present

## 2024-01-04 DIAGNOSIS — I160 Hypertensive urgency: Secondary | ICD-10-CM | POA: Diagnosis present

## 2024-01-04 DIAGNOSIS — Z881 Allergy status to other antibiotic agents status: Secondary | ICD-10-CM | POA: Diagnosis not present

## 2024-01-04 DIAGNOSIS — R918 Other nonspecific abnormal finding of lung field: Secondary | ICD-10-CM | POA: Diagnosis not present

## 2024-01-04 DIAGNOSIS — F32A Depression, unspecified: Secondary | ICD-10-CM | POA: Diagnosis present

## 2024-01-04 DIAGNOSIS — Z8249 Family history of ischemic heart disease and other diseases of the circulatory system: Secondary | ICD-10-CM | POA: Diagnosis not present

## 2024-01-04 DIAGNOSIS — N179 Acute kidney failure, unspecified: Secondary | ICD-10-CM | POA: Diagnosis present

## 2024-01-04 DIAGNOSIS — I517 Cardiomegaly: Secondary | ICD-10-CM | POA: Diagnosis not present

## 2024-01-04 HISTORY — DX: Heart failure, unspecified: I50.9

## 2024-01-04 LAB — BASIC METABOLIC PANEL WITH GFR
Anion gap: 12 (ref 5–15)
BUN: 26 mg/dL — ABNORMAL HIGH (ref 8–23)
CO2: 24 mmol/L (ref 22–32)
Calcium: 8.2 mg/dL — ABNORMAL LOW (ref 8.9–10.3)
Chloride: 103 mmol/L (ref 98–111)
Creatinine, Ser: 2.93 mg/dL — ABNORMAL HIGH (ref 0.44–1.00)
GFR, Estimated: 16 mL/min — ABNORMAL LOW (ref >60)
Glucose, Bld: 187 mg/dL — ABNORMAL HIGH (ref 70–99)
Potassium: 4.1 mmol/L (ref 3.5–5.1)
Sodium: 139 mmol/L (ref 135–145)

## 2024-01-04 LAB — CBG MONITORING, ED
Glucose-Capillary: 106 mg/dL — ABNORMAL HIGH (ref 70–99)
Glucose-Capillary: 53 mg/dL — ABNORMAL LOW (ref 70–99)
Glucose-Capillary: 211 mg/dL — ABNORMAL HIGH (ref 70–99)
Glucose-Capillary: 197 mg/dL — ABNORMAL HIGH (ref 70–99)

## 2024-01-04 LAB — ECHOCARDIOGRAM COMPLETE
AR max vel: 3.82 cm2
AV Area VTI: 3.62 cm2
AV Area mean vel: 3.72 cm2
AV Mean grad: 3 mmHg
AV Peak grad: 6.1 mmHg
Ao pk vel: 1.23 m/s
Area-P 1/2: 2.87 cm2
S' Lateral: 3.1 cm

## 2024-01-04 LAB — TROPONIN I (HIGH SENSITIVITY): Troponin I (High Sensitivity): 127 ng/L (ref <18)

## 2024-01-04 LAB — MAGNESIUM: Magnesium: 2 mg/dL (ref 1.7–2.4)

## 2024-01-04 LAB — GLUCOSE, CAPILLARY
Glucose-Capillary: 129 mg/dL — ABNORMAL HIGH (ref 70–99)
Glucose-Capillary: 203 mg/dL — ABNORMAL HIGH (ref 70–99)

## 2024-01-04 MED ORDER — ROSUVASTATIN CALCIUM 20 MG PO TABS
20.0000 mg | ORAL_TABLET | Freq: Every day | ORAL | Status: DC
  Administered 2024-01-04 – 2024-01-10 (×7): 20 mg via ORAL
  Filled 2024-01-04: qty 1
  Filled 2024-01-04: qty 4
  Filled 2024-01-04 (×5): qty 1

## 2024-01-04 MED ORDER — ENSURE ENLIVE PO LIQD
237.0000 mL | Freq: Two times a day (BID) | ORAL | Status: DC
  Administered 2024-01-05: 237 mL via ORAL

## 2024-01-04 MED ORDER — ASPIRIN 81 MG PO TBEC
81.0000 mg | DELAYED_RELEASE_TABLET | Freq: Every day | ORAL | Status: DC
  Administered 2024-01-04 – 2024-01-10 (×7): 81 mg via ORAL
  Filled 2024-01-04 (×7): qty 1

## 2024-01-04 MED ORDER — ALPRAZOLAM 0.5 MG PO TABS
0.5000 mg | ORAL_TABLET | Freq: Three times a day (TID) | ORAL | Status: DC | PRN
  Administered 2024-01-04 – 2024-01-09 (×10): 0.5 mg via ORAL
  Filled 2024-01-04 (×9): qty 1
  Filled 2024-01-04: qty 2

## 2024-01-04 MED ORDER — FUROSEMIDE 10 MG/ML IJ SOLN
80.0000 mg | Freq: Two times a day (BID) | INTRAMUSCULAR | Status: DC
  Administered 2024-01-04 – 2024-01-05 (×4): 80 mg via INTRAVENOUS
  Filled 2024-01-04 (×4): qty 8

## 2024-01-04 MED ORDER — BUSPIRONE HCL 5 MG PO TABS
5.0000 mg | ORAL_TABLET | Freq: Two times a day (BID) | ORAL | Status: DC
  Administered 2024-01-04 – 2024-01-10 (×13): 5 mg via ORAL
  Filled 2024-01-04 (×13): qty 1

## 2024-01-04 MED ORDER — INSULIN ASPART 100 UNIT/ML IJ SOLN
0.0000 [IU] | Freq: Three times a day (TID) | INTRAMUSCULAR | Status: DC
Start: 2024-01-04 — End: 2024-01-06
  Administered 2024-01-05: 3 [IU] via SUBCUTANEOUS
  Administered 2024-01-05: 4 [IU] via SUBCUTANEOUS
  Administered 2024-01-06: 2 [IU] via SUBCUTANEOUS

## 2024-01-04 MED ORDER — HYDRALAZINE HCL 20 MG/ML IJ SOLN
5.0000 mg | INTRAMUSCULAR | Status: DC | PRN
  Administered 2024-01-04 (×3): 5 mg via INTRAVENOUS
  Filled 2024-01-04 (×3): qty 1

## 2024-01-04 MED ORDER — AMLODIPINE BESYLATE 5 MG PO TABS
5.0000 mg | ORAL_TABLET | Freq: Every day | ORAL | Status: DC
  Administered 2024-01-04: 5 mg via ORAL
  Filled 2024-01-04: qty 1

## 2024-01-04 MED ORDER — ACETAMINOPHEN 650 MG RE SUPP: 650.0000 mg | Freq: Four times a day (QID) | RECTAL | Status: DC | PRN

## 2024-01-04 MED ORDER — KIDNEY FAILURE BOOK: Freq: Once | Status: DC

## 2024-01-04 MED ORDER — HYDRALAZINE HCL 50 MG PO TABS
50.0000 mg | ORAL_TABLET | Freq: Three times a day (TID) | ORAL | Status: DC
  Administered 2024-01-04 – 2024-01-07 (×8): 50 mg via ORAL
  Filled 2024-01-04 (×8): qty 1

## 2024-01-04 MED ORDER — INSULIN ASPART 100 UNIT/ML IJ SOLN
0.0000 [IU] | Freq: Every day | INTRAMUSCULAR | Status: DC
  Administered 2024-01-04 – 2024-01-05 (×2): 2 [IU] via SUBCUTANEOUS

## 2024-01-04 MED ORDER — CALCITRIOL 0.25 MCG PO CAPS
0.2500 ug | ORAL_CAPSULE | Freq: Every day | ORAL | Status: DC
  Administered 2024-01-04 – 2024-01-10 (×7): 0.25 ug via ORAL
  Filled 2024-01-04 (×7): qty 1

## 2024-01-04 MED ORDER — BUDESON-GLYCOPYRROL-FORMOTEROL 160-9-4.8 MCG/ACT IN AERO
2.0000 | INHALATION_SPRAY | Freq: Two times a day (BID) | RESPIRATORY_TRACT | Status: DC
  Administered 2024-01-04 – 2024-01-10 (×13): 2 via RESPIRATORY_TRACT
  Filled 2024-01-04: qty 5.9

## 2024-01-04 MED ORDER — HEPARIN SODIUM (PORCINE) 5000 UNIT/ML IJ SOLN
5000.0000 [IU] | Freq: Three times a day (TID) | INTRAMUSCULAR | Status: DC
  Administered 2024-01-04 – 2024-01-08 (×15): 5000 [IU] via SUBCUTANEOUS
  Filled 2024-01-04 (×15): qty 1

## 2024-01-04 MED ORDER — AMLODIPINE BESYLATE 10 MG PO TABS
10.0000 mg | ORAL_TABLET | Freq: Every day | ORAL | Status: DC
Start: 2024-01-05 — End: 2024-01-10
  Administered 2024-01-05 – 2024-01-10 (×6): 10 mg via ORAL
  Filled 2024-01-04 (×6): qty 1

## 2024-01-04 MED ORDER — ACETAMINOPHEN 325 MG PO TABS
650.0000 mg | ORAL_TABLET | Freq: Four times a day (QID) | ORAL | Status: DC | PRN
  Administered 2024-01-04: 650 mg via ORAL
  Filled 2024-01-04: qty 2

## 2024-01-04 MED ORDER — ROPINIROLE HCL 0.5 MG PO TABS
0.2500 mg | ORAL_TABLET | Freq: Every day | ORAL | Status: DC
  Administered 2024-01-04 – 2024-01-09 (×6): 0.25 mg via ORAL
  Filled 2024-01-04 (×6): qty 1

## 2024-01-04 MED ORDER — HYDRALAZINE HCL 25 MG PO TABS
25.0000 mg | ORAL_TABLET | Freq: Two times a day (BID) | ORAL | Status: DC
  Administered 2024-01-04: 25 mg via ORAL
  Filled 2024-01-04: qty 1

## 2024-01-04 NOTE — Assessment & Plan Note (Addendum)
 Echocardiogram with preserved LV systolic function with EF 60 to 65%, with mild LVH, RV systolic function preserved, no significant valvular disease. Normal size left and right atrium.  No pericardial effusion.    Urine output is 1,550 ml Systolic blood pressure 160 mmHg range.    Continue  afterload reduction with hydralazine / isosorbide  and blood pressure control with amlodipine .  Low dose carvedilol .   Limited pharmacologic therapy due to acutely reduced GFR

## 2024-01-04 NOTE — Assessment & Plan Note (Signed)
 Elevated troponin due to volume overload and acute heart failure Patient with no chest pain or electrocardiographic changes Ruled out for acute coronary syndrome.

## 2024-01-04 NOTE — Assessment & Plan Note (Signed)
Continue blood pressure control and statin therapy.  Follow up with vascular surgery as outpatient. Currently patient with no claudication. Smoking cessation counseling.

## 2024-01-04 NOTE — Progress Notes (Signed)
 Progress Note   Patient: [name] NWG:[id number] DOB: 1946-03-12 DOA: 01/03/2024     0 DOS: the patient was seen and examined on 01/04/2024   Brief hospital course: Mrs. Bartolini was admitted to the hospital with the working diagnosis of volume overload in the setting of chronic kidney disease.   78 yo female with the past medical history of stage V CKD, T2DM, CVA, dyslipidemia, hypertension, and peripheral vascular disease who presented with dyspnea. Reported few weeks of worsening lower extremity edema and progressive dyspnea on exertion.  12/21/23 she had right brachiocephalic fistula created in preparation for hemodialysis. Outpatient follow up on the day of admission she was found volume overloaded by her Nephrologist and was referred to the ED for further evaluation.  On her initial physical examination her blood pressure was 178/54, HR 65, RR 20 and 02 saturation 97%. Lungs with rales bilaterally, heart with S1 and S2 present and regular with positive systolic murmur, abdomen with no distention and positive bilateral lower extremity edema, pitting ++  Na 140, K 3,6 Cl 104 bicarbonate 26 bun 25 cr 2,96  BNP 518  High sensitive troponin 55, 70 and 127  Wbc 10,8 hgb 14.2 plt 247   Chest radiograph with mild cardiomegaly, with mild hilar vascular congestion with no effusions or infiltrates.   EKG 71 bpm, normal axis, normal intervals, qtc 491, sinus rhythm with no significant ST segment changes, negative T wave lead I and aVL (chronic changes).     Assessment and Plan: * CKD (chronic kidney disease) stage 5, GFR less than 15 ml/min (HCC) Patient continue volume overloaded.   Renal function today with serum cr at 2,93 with K at 4.1 and serum bicarbonate at 24  Na 139 and Mg 2,0   No criteria for urgent renal replacement therapy  Plan to continue diuresis with furosemide IV 80 mg bid Close follow up on renal function and electrolytes.  Strict in and out.  Fluid restriction.     Acute on chronic diastolic CHF (congestive heart failure) (HCC) 2023 echocardiogram with preserved LV systolic function.   Continue to have volume overload.  Plan to continue aggressive diuresis with furosemide IV  Follow up on repeat echocardiogram Continue telemetry monitoring.   Resume after load reduction with hydralazine and blood pressure control with amlodipine.  Limited pharmacologic therapy due to acutely reduced GFR  Essential hypertension Resume hydralazine and amlodipine for blood pressure control Continue furosemide IV for diuresis   Type 2 diabetes mellitus with hyperlipidemia (HCC) Continue insulin sliding scale for glucose cover and monitoring . Admission glucose not elevated, hold on basal insulin for now.  Continue statin therapy.   Coronary artery calcification Elevated troponin due to volume overload and acute heart failure Patient with no chest pain or electrocardiographic changes Ruled out for acute coronary syndrome.  Follow up on echocardiogram.   Emphysema of lung (HCC) No signs of acute exacerbation  Continue bronchodilator therapy   Peripheral vascular disease due to secondary diabetes mellitus (HCC) Continue blood pressure control   History of CVA (cerebrovascular accident) Continue blood pressure control and statin therapy       Subjective: Patient continue to have dyspnea on exertion and lower extremity edema, no chest pain, no tremors or confusion. No nausea or vomiting   Physical Exam: Vitals:   01/04/24 0530 01/04/24 0600 01/04/24 0715 01/04/24 0730  BP: (!) 159/50 (!) 147/49 (!) 183/53 (!) 181/56  Pulse: 62 63 63 64  Resp: 19 18 16 16   Temp:  98.2 F (36.8 C)    TempSrc:  Oral    SpO2: 96% 96% 96% 100%   Neurology awake and alert ENT with mild pallor with no icterus Cardiovascular with S1 and S2 present and regular with no gallops or rubs, positive systolic murmur at the right lower sternal border Positive moderate  JVD Positive pitting lower extremity edema ++ Respiratory with diffuse bilateral rales with no wheezing or rhonchi  Abdomen with no distention  Data Reviewed:    Family Communication: no family at the bedside   Disposition: Status is: Observation The patient will require care spanning > 2 midnights and should be moved to inpatient because: IV furosemide   Planned Discharge Destination: Home      Author: Albertus Alt, MD 01/04/2024 8:55 AM  For on call review [email or web address].

## 2024-01-04 NOTE — Assessment & Plan Note (Addendum)
 Positive wheezing, plan to continue Breztri  and will add scheduled douoneb. Check chest radiograph today.

## 2024-01-04 NOTE — Consult Note (Addendum)
 Cardiology Consultation   Patient ID: [name] MRN: [id number]; DOB: 06-20-1946  Admit date: 01/03/2024 Date of Consult: 01/04/2024  PCP:  Donell Beers, FNP   [facility] Providers Cardiologist:  Rollene Rotunda, MD     Patient Profile:   [name] is a 78 y.o. female with a hx of CKD stage V, Type 2 DM, history of CVA, HLD, HTN, COPD, pulmonary nodules, tobacco use, PAD, brain aneurysm who is being seen 01/04/2024 for the evaluation of CHF at the request of Dr. Ella Jubilee.  History of Present Illness:   [name] is a 78 year old female with above medical history who is followed by Dr. Antoine Poche.   Per chart review, patient has a known history of coronary artery calcifications on CT scans int he past. Nuclear stress test in 11/2018 was a normal, low risk study. Cardiac monitor in 01/2020 showed rare runs of SVT, no sustained arrhythmias. She later was admitted in 08/2022 with hypertensive urgency, shortness of breath, chest pain. Echocardiogram 08/28/22 showed EF 60-65%, no regional wall motion abnormalities, grade I DD, normal RV systolic function. A cardiac monitor in 04/2023 showed predominantly normal sinus rhythm, infrequent runs of NSVT with the longest run being 7 beats, occasional runs of SVT with the longest being 16 beats.   Patient also has a history of PAD and is followed by vascular surgery. Previously underwent left superficial femoral artery stenting in 2012 with early in-stent restenosis, s/p Balloon angioplasty which failed. She later underwent left femoral to below-knee popliteal artery bypass in 11/2021. She developed distal anastomotic stenosis with required intervention on 03/09/22. She underwent drug-coated balloon angioplasty to the left femoral-popliteal bypass graft in June 2024.   Patient has been followed by nephrology. In 11/2023 she had decline in her renal function with GFR down to 16. She was seen by vascular surgery, underwent creation of a  right brachiocephalic fistula on 12/21/23.   She presented to the ED on 4/15 after she was seen by her nephrologist. She was sent to the ED with ESRD, reported feeling nauseous and short of breath for the past few days. In the ED, initial BP 185/70, Oxygen 98% on room air, HR 73 BPM. Lab work showed creatinine 2.96, K 3.6, Na 140, WBC 10.8, hemoglobin 14.2, platelets 11.2. BNP elevated to 518.1. hsTn 55>70>127. CXR showed mild cardiomegaly, no active cardiopulmonary disease.   Patient was started on IV lasix 80 mg BID. Cardiology consulted.   On interview, patient reports that for the past few months, she has been seeing her nephrologist every 2 weeks or so because her kidney function has been worsening. She had dialysis access created a few weeks ago due to worsening kidney function, but she has not started dialysis. She has been having progressive lower extremity swelling and shortness of breath for the past few months. Her nephrologist has been trying to manage this by increasing her dose of diuretics. She did notice increase urine output when on increased diuretic dosing, but her symptoms did not improve. Estimates that her dry weight is around 180 lbs. She weighed around 200 lbs prior to admission. She has been having orthopnea for about 3 weeks. Called her nephrologist to report these symptoms yesterday and was told to come to the ED. Denies chest pain. She has been having elevated BP at home and reports that her BP has been difficult to control for over 1 year .  Past Medical History:  Diagnosis Date   Abnormal  findings on esophagogastroduodenoscopy (EGD) 07/2010   Allergy    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,  "small"   Anxiety    Arthritis    Cataract    bilateral - surgery   CKD (chronic kidney disease), stage IV (HCC)    followed by [facility]   Colon polyps    Depression    Diabetes mellitus 1998   type 2   Diverticulosis    Emphysema of lung (HCC)    "no one has evry told  me that"   ESOPHAGEAL STRICTURE 08/27/2008   Family history of adverse reaction to anesthesia    daughter n/v   GERD (gastroesophageal reflux disease)    Glaucoma    bilateral   Heart failure (HCC) 01/04/2024   Hemorrhoids    hx   Hiatal hernia    Hyperlipidemia    Hypertension 2000   Migraines    Neuropathy    fingers and toes   Peripheral vascular disease (HCC)    Phlebitis    30 years ago  left leg   Stroke [facility])    multiple mini strokes ( brain aneurysm ).  Right side weaker.    Past Surgical History:  Procedure Laterality Date   ABDOMINAL AORTAGRAM N/A 01/04/2012   Procedure: ABDOMINAL AORTAGRAM;  Surgeon: Nada Libman, MD;  Location: [facility] CATH LAB;  Service: Cardiovascular;  Laterality: N/A;   ABDOMINAL AORTAGRAM N/A 08/15/2012   Procedure: ABDOMINAL Ronny Flurry;  Surgeon: Nada Libman, MD;  Location: [facility] CATH LAB;  Service: Cardiovascular;  Laterality: N/A;   ABDOMINAL AORTOGRAM W/LOWER EXTREMITY Left 11/17/2021   Procedure: ABDOMINAL AORTOGRAM W/LOWER EXTREMITY;  Surgeon: Nada Libman, MD;  Location: [facility] INVASIVE CV LAB;  Service: Cardiovascular;  Laterality: Left;   ABDOMINAL AORTOGRAM W/LOWER EXTREMITY N/A 03/09/2022   Procedure: ABDOMINAL AORTOGRAM W/LOWER EXTREMITY;  Surgeon: Nada Libman, MD;  Location: [facility] INVASIVE CV LAB;  Service: Cardiovascular;  Laterality: N/A;   ABDOMINAL AORTOGRAM W/LOWER EXTREMITY N/A 03/15/2023   Procedure: ABDOMINAL AORTOGRAM W/LOWER EXTREMITY;  Surgeon: Nada Libman, MD;  Location: [facility] INVASIVE CV LAB;  Service: Cardiovascular;  Laterality: N/A;   ABDOMINAL HYSTERECTOMY  1984   AV FISTULA PLACEMENT Right 12/21/2023   Procedure: RIGHT ARM BRACHIOCEPHALIC ARTERIOVENOUS (AV) FISTULA CREATION;  Surgeon: Nada Libman, MD;  Location: [facility] OR;  Service: Vascular;  Laterality: Right;   BREAST SURGERY     cataract surgery Right 10/22/2015   cataract surgery Left 11/05/2015   CESAREAN SECTION     CHOLECYSTECTOMY     COLONOSCOPY   07/2010   DENTAL SURGERY  05/05/2012   left lower    ESOPHAGOGASTRODUODENOSCOPY  07/2010   EYE SURGERY  07/2013   Laser-Glaucoma   FEMORAL ARTERY STENT  05/11/2011   Left superficial femoral and popliteal artery   FEMORAL-POPLITEAL BYPASS GRAFT Left 11/18/2021   Procedure: LEFT FEMORAL-POPLITEAL ARTERY BYPASS GRAFT;  Surgeon: Nada Libman, MD;  Location: [facility] OR;  Service: Vascular;  Laterality: Left;   HERNIA REPAIR     times two   KNEE SURGERY     LOWER EXTREMITY ANGIOGRAM Left 08/15/2012   Procedure: LOWER EXTREMITY ANGIOGRAM;  Surgeon: Nada Libman, MD;  Location: [facility] CATH LAB;  Service: Cardiovascular;  Laterality: Left;  lt leg angio   PERIPHERAL VASCULAR BALLOON ANGIOPLASTY Left 03/09/2022   Procedure: PERIPHERAL VASCULAR BALLOON ANGIOPLASTY;  Surgeon: Nada Libman, MD;  Location: [facility] INVASIVE CV LAB;  Service: Cardiovascular;  Laterality: Left;   PERIPHERAL VASCULAR  BALLOON ANGIOPLASTY Left 03/15/2023   Procedure: PERIPHERAL VASCULAR BALLOON ANGIOPLASTY;  Surgeon: Margherita Shell, MD;  Location: [facility] INVASIVE CV LAB;  Service: Cardiovascular;  Laterality: Left;   rotator cuff surgery     THYROID SURGERY        Inpatient Medications: Scheduled Meds:  amLODipine  5 mg Oral Daily   aspirin EC  81 mg Oral Daily   budeson-glycopyrrolate-formoterol  2 puff Inhalation BID   busPIRone  5 mg Oral BID   calcitRIOL  0.25 mcg Oral Daily   furosemide  80 mg Intravenous Q12H   heparin  5,000 Units Subcutaneous Q8H   hydrALAZINE  25 mg Oral BID   insulin aspart  0-5 Units Subcutaneous QHS   insulin aspart  0-6 Units Subcutaneous TID WC   rosuvastatin  20 mg Oral Daily   Continuous Infusions:  PRN Meds: acetaminophen **OR** acetaminophen, ALPRAZolam, hydrALAZINE  Allergies:    Allergies  Allergen Reactions   Ciprofloxacin Swelling   Ozempic (0.25 Or 0.5 Mg-Dose) [Semaglutide(0.25 Or 0.5mg -Dos)] Nausea And Vomiting   Plavix [Clopidogrel Bisulfate] Palpitations    Amoxicillin Itching and Swelling    FACE & EYES SWELL   Ace Inhibitors Cough    Sore throat   Atorvastatin     myalgias   Lyrica [Pregabalin] Itching and Nausea Only     gain weight   Penicillins Other (See Comments)    Pt unsure if there is an allergic reaction     Social History:   Social History   Socioeconomic History   Marital status: Married    Spouse name: Not on file   Number of children: 2   Years of education: Not on file   Highest education level: 12th grade  Occupational History   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  Tobacco Use   Smoking status: Every Day    Current packs/day: 0.40    Average packs/day: 0.4 packs/day for 48.0 years (20.0 ttl pk-yrs)    Types: Cigarettes    Passive exposure: Never   Smokeless tobacco: Never   Tobacco comments:    11/18/20 in process of quiting    03/31/23 5 cigarettes a day   Vaping Use   Vaping status: Never Used  Substance and Sexual Activity   Alcohol use: Never   Drug use: Never   Sexual activity: Not Currently    Birth control/protection: None, Surgical    Comment: Hysterctomy  Other Topics Concern   Not on file  Social History Narrative   Lives with husband.        Social Drivers of Corporate investment banker Strain: Low Risk  (09/19/2023)   Overall Financial Resource Strain (CARDIA)    Difficulty of Paying Living Expenses: Not hard at all  Food Insecurity: No Food Insecurity (09/19/2023)   Hunger Vital Sign    Worried About Running Out of Food in the Last Year: Never true    Ran Out of Food in the Last Year: Never true  Transportation Needs: No Transportation Needs (09/19/2023)   PRAPARE - Administrator, Civil Service (Medical): No    Lack of Transportation (Non-Medical): No  Physical Activity: Unknown (09/19/2023)   Exercise Vital Sign    Days of Exercise per Week: 0 days    Minutes of Exercise per Session: Not on file  Stress: Stress Concern Present (09/19/2023)   [employer] of Occupational Health - Occupational Stress Questionnaire    Feeling of Stress : Very much  Social Connections: Moderately Integrated (09/19/2023)   Social Connection and Isolation Panel [NHANES]    Frequency of Communication with Friends and Family: More than three times a week    Frequency of Social Gatherings with Friends and Family: Three times a week    Attends Religious Services: More than 4 times per year    Active Member of Clubs or Organizations: No    Attends Banker Meetings: Not on file    Marital Status: Married  Catering manager Violence: Not At Risk (08/28/2022)   Humiliation, Afraid, Rape, and Kick questionnaire    Fear of Current or Ex-Partner: No    Emotionally Abused: No    Physically Abused: No    Sexually Abused: No    Family History:    Family History  Problem Relation Age of Onset   CAD Mother 3       Died of MI   Hypertension Mother    Heart attack Mother    Heart disease Mother    CAD Father 9       Died of MI   Heart disease Father    Heart attack Father    CAD Brother 49       Two brothers died of MI   Heart attack Brother    Heart disease Brother        Amputation   Diabetes Sister    Hypertension Sister    Heart attack Brother    Heart disease Brother    Stroke Sister    Colon cancer Neg Hx    Stomach cancer Neg Hx    Esophageal cancer Neg Hx      ROS:  Please see the history of present illness.   All other ROS reviewed and negative.     Physical Exam/Data:   Vitals:   01/04/24 1021 01/04/24 1034 01/04/24 1208 01/04/24 1300  BP:   (!) 198/62 (!) 205/91  Pulse:    87  Resp:    (!) 26  Temp: 98.2 F (36.8 C) 98.1 F (36.7 C)    TempSrc: Oral Oral    SpO2:    (!) 81%   No intake or output data in the 24 hours ending 01/04/24 1411    12/21/2023    6:45 AM 12/20/2023    8:54 AM 12/12/2023    1:56 PM  Last 3 Weights  Weight (lbs) 197 lb 196 lb 10.4 oz 194 lb  Weight (kg) 89.359 kg 89.2 kg 87.998 kg      There is no height or weight on file to calculate BMI.  General:  Well nourished, well developed, in no acute distress. Laying in the bed with head elevated  HEENT: normal Neck: no JVD Vascular:  Radial pulses 2+ bilaterally Cardiac:  normal S1, S2; RRR; no murmur Lungs:  clear to auscultation bilaterally, no wheezing, rhonchi or rales. Normal WOB on room air   Abd: soft, nontender  Ext: 2+ edema in BLE  Musculoskeletal:  No deformities  Skin: warm and dry  Neuro: no focal abnormalities noted Psych:  Normal affect   EKG:  The EKG was personally reviewed and demonstrates:  NSR, HR 66 BPM  Telemetry:  Telemetry was personally reviewed and demonstrates:  NSR  Relevant CV Studies: Cardiac Studies & Procedures   ______________________________________________________________________________________________   STRESS TESTS  MYOCARDIAL PERFUSION IMAGING 11/23/2018  Narrative  Nuclear stress EF: 56%.  The left ventricular ejection fraction is normal (55-65%).  There was no ST segment deviation noted during  stress.  The study is normal.  This is a low risk study.  Normal stress nuclear study with no ischemia or infarction.  Gated ejection fraction 56% with normal wall motion.   ECHOCARDIOGRAM  ECHOCARDIOGRAM COMPLETE 08/28/2022  Narrative ECHOCARDIOGRAM REPORT    Patient Name:   ELWYN LOWDEN Date of Exam: 08/28/2022 Medical Rec #:  [id number]      Height:       66.0 in Accession #:    [id number]     Weight:       187.0 lb Date of Birth:  [birth year]       BSA:          1.943 m Patient Age:    76 years       BP:           179/63 mmHg Patient Gender: F              HR:           59 bpm. Exam Location:  Inpatient  Procedure: 2D Echo, Color Doppler and Cardiac Doppler  Indications:    Dyspnea  History:        Patient has prior history of Echocardiogram examinations, most recent 01/26/2017. Stroke and PVD; Risk Factors:Dyslipidemia, Diabetes and Hypertension.  CKD.  Referring Phys: [id number] [facility]   Sonographer Comments: Suboptimal parasternal window and suboptimal apical window. Image acquisition challenging due to patient body habitus and Image acquisition challenging due to respiratory motion. IMPRESSIONS   1. Left ventricular ejection fraction, by estimation, is 60 to 65%. The left ventricle has normal function. The left ventricle has no regional wall motion abnormalities. There is moderate left ventricular hypertrophy of the infero-lateral segment. Left ventricular diastolic parameters are consistent with Grade I diastolic dysfunction (impaired relaxation). Elevated left ventricular end-diastolic pressure. 2. Right ventricular systolic function is normal. The right ventricular size is normal. 3. The mitral valve is normal in structure. No evidence of mitral valve regurgitation. No evidence of mitral stenosis. 4. The aortic valve is tricuspid. Aortic valve regurgitation is not visualized. Aortic valve sclerosis/calcification is present, without any evidence of aortic stenosis. 5. The inferior vena cava is normal in size with greater than 50% respiratory variability, suggesting right atrial pressure of 3 mmHg.  FINDINGS Left Ventricle: Left ventricular ejection fraction, by estimation, is 60 to 65%. The left ventricle has normal function. The left ventricle has no regional wall motion abnormalities. The left ventricular internal cavity size was normal in size. There is moderate left ventricular hypertrophy of the infero-lateral segment. Left ventricular diastolic parameters are consistent with Grade I diastolic dysfunction (impaired relaxation). Elevated left ventricular end-diastolic pressure.  Right Ventricle: The right ventricular size is normal. No increase in right ventricular wall thickness. Right ventricular systolic function is normal.  Left Atrium: Left atrial size was normal in size.  Right Atrium: Right atrial size was normal in  size.  Pericardium: There is no evidence of pericardial effusion.  Mitral Valve: The mitral valve is normal in structure. No evidence of mitral valve regurgitation. No evidence of mitral valve stenosis. MV peak gradient, 7.5 mmHg. The mean mitral valve gradient is 2.0 mmHg.  Tricuspid Valve: The tricuspid valve is normal in structure. Tricuspid valve regurgitation is trivial. No evidence of tricuspid stenosis.  Aortic Valve: The aortic valve is tricuspid. Aortic valve regurgitation is not visualized. Aortic valve sclerosis/calcification is present, without any evidence of aortic stenosis.  Pulmonic Valve: The pulmonic valve was normal in structure. Pulmonic valve regurgitation  is not visualized. No evidence of pulmonic stenosis.  Aorta: The aortic root is normal in size and structure.  Venous: The inferior vena cava is normal in size with greater than 50% respiratory variability, suggesting right atrial pressure of 3 mmHg.  IAS/Shunts: No atrial level shunt detected by color flow Doppler.   LEFT VENTRICLE PLAX 2D LVIDd:         4.30 cm     Diastology LVIDs:         3.30 cm     LV e' medial:    3.41 cm/s LV PW:         1.50 cm     LV E/e' medial:  19.7 LV IVS:        0.90 cm     LV e' lateral:   4.38 cm/s LVOT diam:     2.20 cm     LV E/e' lateral: 15.4 LV SV:         104 LV SV Index:   54 LVOT Area:     3.80 cm  LV Volumes (MOD) LV vol d, MOD A2C: 65.4 ml LV vol d, MOD A4C: 66.1 ml LV vol s, MOD A2C: 25.3 ml LV vol s, MOD A4C: 23.2 ml LV SV MOD A2C:     40.1 ml LV SV MOD A4C:     66.1 ml LV SV MOD BP:      43.4 ml  RIGHT VENTRICLE             IVC RV S prime:     11.30 cm/s  IVC diam: 2.00 cm TAPSE (M-mode): 1.9 cm  LEFT ATRIUM             Index        RIGHT ATRIUM          Index LA diam:        3.60 cm 1.85 cm/m   RA Area:     9.89 cm LA Vol (A2C):   63.4 ml 32.62 ml/m  RA Volume:   20.00 ml 10.29 ml/m LA Vol (A4C):   48.0 ml 24.70 ml/m LA Biplane Vol: 55.6 ml  28.61 ml/m AORTIC VALVE LVOT Vmax:   116.00 cm/s LVOT Vmean:  79.700 cm/s LVOT VTI:    0.274 m  AORTA Ao Root diam: 2.80 cm Ao Asc diam:  3.40 cm  MITRAL VALVE                TRICUSPID VALVE MV Area (PHT): 2.20 cm     TR Peak grad:   7.8 mmHg MV Area VTI:   2.71 cm     TR Vmax:        140.00 cm/s MV Peak grad:  7.5 mmHg MV Mean grad:  2.0 mmHg     SHUNTS MV Vmax:       1.37 m/s     Systemic VTI:  0.27 m MV Vmean:      54.7 cm/s    Systemic Diam: 2.20 cm MV Decel Time: 345 msec MV E velocity: 67.30 cm/s MV A velocity: 116.00 cm/s MV E/A ratio:  0.58  Armanda Magic MD Electronically signed by Armanda Magic MD Signature Date/Time: 08/28/2022/3:25:02 PM    Final    MONITORS  LONG TERM MONITOR (3-14 DAYS) 04/25/2023  Narrative Predominant rhythm was normal sinus Infrequent runs of non sustained ventricular tachycardia with the longest run being 7 beats Occasional runs of supraventricular tachycardia longest being 16 beats. No sustained arrhythmias  ______________________________________________________________________________________________       Laboratory Data:  High Sensitivity Troponin:   Recent Labs  Lab 01/03/24 1300 01/03/24 1455 01/04/24 0307  TROPONINIHS 55* 70* 127*     Chemistry Recent Labs  Lab 01/03/24 1300 01/04/24 0308  NA 140 139  K 3.6 4.1  CL 104 103  CO2 26 24  GLUCOSE 100* 187*  BUN 25* 26*  CREATININE 2.96* 2.93*  CALCIUM 8.5* 8.2*  MG  --  2.0  GFRNONAA 16* 16*  ANIONGAP 10 12    Recent Labs  Lab 01/03/24 1300  PROT 5.8*  ALBUMIN 2.4*  AST 29  ALT 18  ALKPHOS 71  BILITOT 0.5   Lipids No results for input(s): "CHOL", "TRIG", "HDL", "LABVLDL", "LDLCALC", "CHOLHDL" in the last 168 hours.  Hematology Recent Labs  Lab 01/03/24 1300  WBC 10.8*  RBC 4.39  HGB 14.2  HCT 43.8  MCV 99.8  MCH 32.3  MCHC 32.4  RDW 11.2*  PLT 247   Thyroid No results for input(s): "TSH", "FREET4" in the last 168 hours.   BNP Recent Labs  Lab 01/03/24 1300  BNP 518.1*    DDimer No results for input(s): "DDIMER" in the last 168 hours.   Radiology/Studies:  DG Chest 2 View Result Date: 01/03/2024 CLINICAL DATA:  Chest pain and shortness of breath EXAM: CHEST - 2 VIEW COMPARISON:  Chest x-ray 08/27/2022.  CT of the chest 07/21/2023. FINDINGS: There is a calcified granuloma in the right lower lobe, unchanged. The lungs are otherwise clear. The heart is mildly enlarged. There is no pneumothorax. There is no pleural effusion. No acute fractures are seen. IMPRESSION: 1. No active cardiopulmonary disease. 2. Mild cardiomegaly. Electronically Signed   By: Darliss Cheney M.D.   On: 01/03/2024 15:53     Assessment and Plan:   Acute on Chronic Diastolic Heart Failure  - Previous echo from 08/2022 showed EF 60-65%, no regional wall motion abnormalities, grade I DD, normal RV systolic function - Now admitted with hypervolemia. BNP 518. CXR without active cardiopulmonary disease  - Now on IV lasix 80mg  BID- I/Os not recorded. Creatinine 2.96 on arrival to the ED. Follow renal function closely with diuresis. Reports good urine output on IV lasix  - Continues to have lower extremity edema. Dry weight is 180 lbs, estimates she weighed 200 lbs on the day of admission - Continue IV lasix 80 mg BID  - Echo pending this admission   Elevated Troponin - hsTn 55>70>127. Patient denies chest pain  - Suspect demand ischemia in the setting of volume overload, HTN, CKD - Echo pending this admission   CKD stage V  - Followed closely by nephrology. Recently had dialysis access created but has not needed dialysis yet  - Creatinine 2.96 on arrival  - As above, follow renal function closely with diuresis   HTN  - BP has been elevated this admission- up to the 200s/90s  - Increase hydralazine to 50 mg TID starting today - Increase amlodipine to 10 mg daily starting tomorrow AM  - Pending echo, consider starting carvedilol    Coronary artery calcifications on CT  - CT chest from 06/2023 showed coronary artery calcifications and aortic atherosclerotic calcifications  - Previous nuclear stress test in 2020 was a normal, low risk study  - Patient denies chest pain. EKG nonischemic  - Continue ASA 81 mg daily, crestor 20 mg daily   Otherwise per primary  - Type 2 DM  - Emphysema  - PAD  -  History of CVA    Risk Assessment/Risk Scores:  [id number]}    [place]  Heart Association (NYHA) Functional Class NYHA Class III   For questions or updates, please contact [facility] Please consult [email or web address] for contact info under   Signed, Debria Fang, PA-C  01/04/2024 2:11 PM  History and all data above reviewed.  Patient examined.  I agree with the findings as above.  The patient presents with progressive lower extremity swelling, abdominal distention and shortness of breath.  She has had progressive renal insufficiency with a history of poorly controlled diabetes.  She also has dyslipidemia, hypertension and tobacco use.  She is describing PND and orthopnea.  She watches her salt but she does drink a fair amount of fluid.  Home diuretics have not reduced her symptoms.  She does take p.o. diuretics.  The patient exam reveals COR: Regular rate and rhythm, jugular venous distention 12 cm at 45 degrees,  Lungs: Decreased breath sounds bilaterally with crackles,  Abd: Positive bowel sounds normal frequency pitch, bruits, rebound and guarding, Ext 2+ pulses, no edema..  All available labs, radiology testing, previous records reviewed. Agree with documented assessment and plan.  Acute on chronic heart failure: Echocardiogram reported today demonstrates well-preserved ejection fraction with some mild left ventricular hypertrophy and I suspect diastolic dysfunction.  There are no significant valvular abnormalities.  Right ventricular function is normal.  At this point I agree with current plans for diuresis.   Place compression stockings.  She is going to keep her feet elevated.  We had a long discussion about the physiology of this.  She understands she might progress to require dialysis.  She understands the importance of risk factor modification.  Hypertension: Agree with increase hydralazine as above.  Will increase amlodipine tomorrow.  Elevated troponin: I suspect demand ischemia.  She is not having any chest pain.  At this point are not planning further ischemia workup this admission.  Eilleen Grates  6:37 PM  01/04/2024

## 2024-01-04 NOTE — Assessment & Plan Note (Addendum)
 Continue insulin  sliding scale for glucose cover and monitoring . Uncontrolled hyperglycemia with fasting glucose today 258 mg/dl  In the setting of worsening GFR will hold on further insulin  adjustments for now.  Continue statin therapy.

## 2024-01-04 NOTE — Hospital Course (Addendum)
 [name] was admitted to the hospital with the working diagnosis of volume overload in the setting of chronic kidney disease.   78 yo female with the past medical history of stage V CKD, T2DM, CVA, dyslipidemia, hypertension, and peripheral vascular disease who presented with dyspnea. Reported few weeks of worsening lower extremity edema and progressive dyspnea on exertion.  12/21/23 she had right brachiocephalic fistula created in preparation for hemodialysis. Outpatient follow up on the day of admission she was found volume overloaded by her Nephrologist and was referred to the ED for further evaluation.  On her initial physical examination her blood pressure was 178/54, HR 65, RR 20 and 02 saturation 97%. Lungs with rales bilaterally, heart with S1 and S2 present and regular with positive systolic murmur, abdomen with no distention and positive bilateral lower extremity edema, pitting ++  Na 140, K 3,6 Cl 104 bicarbonate 26 bun 25 cr 2,96  BNP 518  High sensitive troponin 55, 70 and 127  Wbc 10,8 hgb 14.2 plt 247   Chest radiograph with mild cardiomegaly, with mild hilar vascular congestion with no effusions or infiltrates.   EKG 71 bpm, normal axis, normal intervals, qtc 491, sinus rhythm with no significant ST segment changes, negative T wave lead I and aVL (chronic changes).   Patient was placed on furosemide  with improvement in her volume status.   04/18 worsening renal function per serum cr, and furosemide  has been held.  04/19 renal function more stable, blood pressure not yet controlled.  04/20 patient with dyspnea and orthopnea, resumed IV furosemide   04/21 resume oral loop diuretic, possible discharge tomorrow if renal function and blood pressure stable.

## 2024-01-04 NOTE — Assessment & Plan Note (Addendum)
 Resume hydralazine / isosorbide  and amlodipine  for blood pressure control Low dose carvedilol .

## 2024-01-04 NOTE — Assessment & Plan Note (Signed)
 Continue blood pressure control.

## 2024-01-04 NOTE — ED Notes (Signed)
 Patient given orange juice and is eating breakfast at this time

## 2024-01-04 NOTE — Assessment & Plan Note (Addendum)
 Today with worsening serum cr and signs of volume overload.  04/19 Chest radiograph with cephalization of the vasculature and hilar vascular congestion   Worsening renal function with serum cr at 3.42 with K at 4,7 and serum bicarbonate at 20  Na 136 and Mg 2.2   Will resume IV furosemide . 80 mg IV x1 and will follow up urine output, if not significant will repeat dose this pm.   Metabolic bone disease continue with calcitriol .

## 2024-01-05 DIAGNOSIS — I2510 Atherosclerotic heart disease of native coronary artery without angina pectoris: Secondary | ICD-10-CM | POA: Diagnosis not present

## 2024-01-05 DIAGNOSIS — I5033 Acute on chronic diastolic (congestive) heart failure: Secondary | ICD-10-CM | POA: Diagnosis not present

## 2024-01-05 DIAGNOSIS — N185 Chronic kidney disease, stage 5: Secondary | ICD-10-CM | POA: Diagnosis not present

## 2024-01-05 DIAGNOSIS — I10 Essential (primary) hypertension: Secondary | ICD-10-CM | POA: Diagnosis not present

## 2024-01-05 DIAGNOSIS — J439 Emphysema, unspecified: Secondary | ICD-10-CM

## 2024-01-05 LAB — GLUCOSE, CAPILLARY
Glucose-Capillary: 112 mg/dL — ABNORMAL HIGH (ref 70–99)
Glucose-Capillary: 339 mg/dL — ABNORMAL HIGH (ref 70–99)
Glucose-Capillary: 251 mg/dL — ABNORMAL HIGH (ref 70–99)
Glucose-Capillary: 257 mg/dL — ABNORMAL HIGH (ref 70–99)
Glucose-Capillary: 234 mg/dL — ABNORMAL HIGH (ref 70–99)

## 2024-01-05 LAB — BASIC METABOLIC PANEL WITH GFR
Anion gap: 7 (ref 5–15)
BUN: 29 mg/dL — ABNORMAL HIGH (ref 8–23)
CO2: 28 mmol/L (ref 22–32)
Calcium: 8.6 mg/dL — ABNORMAL LOW (ref 8.9–10.3)
Chloride: 106 mmol/L (ref 98–111)
Creatinine, Ser: 2.98 mg/dL — ABNORMAL HIGH (ref 0.44–1.00)
GFR, Estimated: 16 mL/min — ABNORMAL LOW (ref >60)
Glucose, Bld: 94 mg/dL (ref 70–99)
Potassium: 3.5 mmol/L (ref 3.5–5.1)
Sodium: 141 mmol/L (ref 135–145)

## 2024-01-05 LAB — MAGNESIUM: Magnesium: 1.9 mg/dL (ref 1.7–2.4)

## 2024-01-05 MED ORDER — ISOSORBIDE MONONITRATE ER 60 MG PO TB24
60.0000 mg | ORAL_TABLET | Freq: Every day | ORAL | Status: DC
  Administered 2024-01-05 – 2024-01-08 (×4): 60 mg via ORAL
  Filled 2024-01-05 (×4): qty 1

## 2024-01-05 MED ORDER — POTASSIUM CHLORIDE CRYS ER 20 MEQ PO TBCR
40.0000 meq | EXTENDED_RELEASE_TABLET | Freq: Once | ORAL | Status: AC
  Administered 2024-01-05: 40 meq via ORAL
  Filled 2024-01-05: qty 2

## 2024-01-05 NOTE — Progress Notes (Addendum)
 Patient Name: [name] Date of Encounter: 01/05/2024  [facility] Cardiologist: Rollene Rotunda, MD   Interval Summary  .    78 yr old female with PMH of CKD V, type 2 DM, CVA, HTN, HLD, COPD, pulmonary nodules, tobacco use, PAD, brain aneurysm, who is followed by cardiology for acute on chronic diastolic heart failure, course complicated by progression of CKD V potentially needing dialysis.  Patient is ambulating in the room independently, states she just washed her face, feels SOB with ambulation and moving around, no chest pain, felt her leg swelling are improving and has been urinating a lot. She expressed wish to not go on dialysis. She states she is quite anxious.   Vital Signs .    Vitals:   01/04/24 2330 01/04/24 2339 01/05/24 0530 01/05/24 0721  BP: (!) 170/52 (!) 170/52  (!) 183/50  Pulse: 69   71  Resp:    16  Temp: 98.4 F (36.9 C)  98.1 F (36.7 C) 98.5 F (36.9 C)  TempSrc: Oral  Oral Oral  SpO2: 95%   94%  Weight:   84.4 kg   Height:        Intake/Output Summary (Last 24 hours) at 01/05/2024 0820 Last data filed at 01/05/2024 0534 Gross per 24 hour  Intake 240 ml  Output 1725 ml  Net -1485 ml      01/05/2024    5:30 AM 01/04/2024    5:03 PM 12/21/2023    6:45 AM  Last 3 Weights  Weight (lbs) 186 lb 188 lb 9.6 oz 197 lb  Weight (kg) 84.369 kg 85.548 kg 89.359 kg      Telemetry/ECG    Sinus rhythm, PVCs - Personally Reviewed  Physical Exam .   GEN: No acute distress.   Neck: No JVD Cardiac: RRR, no murmurs, rubs, or gallops.  Respiratory: Clear to auscultation bilaterally. On room air. Speaks full sentence. Increased SOB with ambulation.  GI: Soft, nontender, non-distended  MS: No leg edema, TEDs on bilaterally   Assessment & Plan .     Acute on Chronic Diastolic Heart Failure  - presented with volume overload, worsening CKD V  - BNP 518, CXR no acute finding, Cr 2.96, POA - Echo 4/16 showed LVEF 60-65%, no RWMA, grade I DD, mild  LVH, normal RV, aortic sclerosis, IVC normal  - on PTA PO Lasix 80mg  BID/complaint, now placed on IV Lasix 80mg  BID, UOP over the past 24 hours, weight 188.6 >186ib so far, Cr 2.98 today,  will continue IV diuresis at current dose, trend daily weight, I&O, BMP; if UOP not robust, will add PRN metolazone  - GDMT: limited by advanced renal disease, on amlodipine 10mg  and hydralazine 50mg  TID for HTN currently, BP remains high, will add Imdur 60mg  today  Elevated Troponin Coronary artery calcifications on CT  - hsTn 55>70>127. EKG no acute finding. Patient denies chest pain  - nuclear stress test in 2020 was a normal, low risk study  - CT chest from 06/2023 showed coronary artery calcifications and aortic atherosclerotic calcifications  - suspect demand ischemia in the setting of CHF and CKD V, she certainly has risk factor for CAD, not plan for invasive ischemic evaluation at this time due to lacking symptoms and patient's wish to avoid dialysis, continue medical therapy with ASA 81mg  daily and crestor 20mg  daily   HTN - BP elevated, anxiety probably plays a role  - currently on amlodipine 10mg  and hydralazine 50mg  TID, will add  Imdur 60mg  today, if remains high, may add coreg next   CKD V Type 2 DM COPD CVA  PAD  Tobacco use  - per primary team   For questions or updates, please contact [facility] Please consult [email or web address] for contact info under     Signed, Xika Zhao, NP   History and all data above reviewed.  Patient examined.  I agree with the findings as above.  The patient exam reveals COR:RRR  ,  Lungs: Few scattered wheezes  ,  Abd: Positive bowel sounds, no rebound no guarding, Ext Mild edema  .  All available labs, radiology testing, previous records reviewed. Agree with documented assessment and plan. Acute on chronic systolic and diastolic HF:  Good urine output and renal function seems to be tolerating this.  For not continue current dose diuretic.   Prolonged QT:  Mag is borderline.  I will supplement the potassium.  Avoid QT prolonging drugs.   Elevated troponin:  No plans for ischemia work up.  HTN:  Med changes as above.     Royston [facility]  10:32 AM  01/05/2024

## 2024-01-05 NOTE — Progress Notes (Signed)
 Mobility Specialist Progress Note;   01/05/24 1012  Mobility  Activity Ambulated with assistance in hallway  Level of Assistance Contact guard assist, steadying assist  Assistive Device Front wheel walker  Distance Ambulated (ft) 200 ft  Activity Response Tolerated well  Mobility Referral Yes  Mobility visit 1 Mobility  Mobility Specialist Start Time (ACUTE ONLY) 1012  Mobility Specialist Stop Time (ACUTE ONLY) 1024  Mobility Specialist Time Calculation (min) (ACUTE ONLY) 12 min   Pt eager for mobility. Required MinG assistance during ambulation for safety. Pt anxious about SOB while ambulating, encouraged PLB throughout to maintain SPO2. VSS on RA. Pt did well w/ encouragement and motivation from this Clinical research associate and visitors in room. Pt returned back to chair with all needs met.   Janit Meline Mobility Specialist Please contact via SecureChat or [employer] [phone]

## 2024-01-05 NOTE — Progress Notes (Addendum)
 Progress Note   Patient: [name] ZHY:[id number] DOB: Feb 07, 1946 DOA: 01/03/2024     1 DOS: the patient was seen and examined on 01/05/2024   Brief hospital course: [name] was admitted to the hospital with the working diagnosis of volume overload in the setting of chronic kidney disease.   78 yo female with the past medical history of stage V CKD, T2DM, CVA, dyslipidemia, hypertension, and peripheral vascular disease who presented with dyspnea. Reported few weeks of worsening lower extremity edema and progressive dyspnea on exertion.  12/21/23 she had right brachiocephalic fistula created in preparation for hemodialysis. Outpatient follow up on the day of admission she was found volume overloaded by her Nephrologist and was referred to the ED for further evaluation.  On her initial physical examination her blood pressure was 178/54, HR 65, RR 20 and 02 saturation 97%. Lungs with rales bilaterally, heart with S1 and S2 present and regular with positive systolic murmur, abdomen with no distention and positive bilateral lower extremity edema, pitting ++  Na 140, K 3,6 Cl 104 bicarbonate 26 bun 25 cr 2,96  BNP 518  High sensitive troponin 55, 70 and 127  Wbc 10,8 hgb 14.2 plt 247   Chest radiograph with mild cardiomegaly, with mild hilar vascular congestion with no effusions or infiltrates.   EKG 71 bpm, normal axis, normal intervals, qtc 491, sinus rhythm with no significant ST segment changes, negative T wave lead I and aVL (chronic changes).     Assessment and Plan: * CKD (chronic kidney disease) stage 5, GFR less than 15 ml/min (HCC) Improved volume status Renal function today with serum cr at 2,98 with K at 3,5 and serum bicarbonate at 28  Na 141 and Mg 1.9   Continue diuresis with furosemide and follow up renal function in am.  Caution with K repletion in the setting of reduced GFR.   Metabolic bone disease continue with calcitriol.    Acute on chronic diastolic CHF  (congestive heart failure) (HCC) Echocardiogram with preserved LV systolic function with EF 60 to 65%, with mild LVH, RV systolic function preserved, no significant valvular disease. Normal size left and right atrium.  No pericardial effusion.    Urine output is 1,725 ml Systolic blood pressure 182/46   Continue  afterload reduction with hydralazine/ isosorbide and blood pressure control with amlodipine.  Limited pharmacologic therapy due to acutely reduced GFR  Essential hypertension Resume hydralazine/ isosorbide and amlodipine for blood pressure control Continue furosemide IV for diuresis   Type 2 diabetes mellitus with hyperlipidemia (HCC) Continue insulin sliding scale for glucose cover and monitoring . Admission glucose not elevated, hold on basal insulin for now.  Continue statin therapy.   Coronary artery calcification Elevated troponin due to volume overload and acute heart failure Patient with no chest pain or electrocardiographic changes Ruled out for acute coronary syndrome.  Emphysema of lung (HCC) No signs of acute exacerbation  Continue bronchodilator therapy   Peripheral vascular disease due to secondary diabetes mellitus (HCC) Continue blood pressure control   History of CVA (cerebrovascular accident) Continue blood pressure control and statin therapy    Subjective: Patient with improvement in her dyspnea and edema, no PND or orthopnea.   Physical Exam: Vitals:   01/04/24 2339 01/05/24 0530 01/05/24 0721 01/05/24 1030  BP: (!) 170/52  (!) 183/50 (!) 182/46  Pulse:   71   Resp:   16   Temp:  98.1 F (36.7 C) 98.5 F (36.9 C)   TempSrc:  Oral Oral   SpO2:   94%   Weight:  84.4 kg    Height:       Neurology awake and alert ENT with mild pallor with no icterus Cardiovascular with S1 and S2 present and regular with no rubs or gallops, no murmurs No JVD Lower extremity edema +  Respiratory with no rales or wheezing, no rhonchi  Abdomen with no  distention  Data Reviewed:    Family Communication: I spoke with patient's family at the bedside, we talked in detail about patient's condition, plan of care and prognosis and all questions were addressed.   Disposition: Status is: Inpatient Remains inpatient appropriate because: IV diuresis   Planned Discharge Destination: Home    Author: Albertus Alt, MD 01/05/2024 11:11 AM  For on call review [email or web address].

## 2024-01-06 DIAGNOSIS — I2510 Atherosclerotic heart disease of native coronary artery without angina pectoris: Secondary | ICD-10-CM | POA: Diagnosis not present

## 2024-01-06 DIAGNOSIS — J449 Chronic obstructive pulmonary disease, unspecified: Secondary | ICD-10-CM | POA: Insufficient documentation

## 2024-01-06 DIAGNOSIS — N185 Chronic kidney disease, stage 5: Secondary | ICD-10-CM | POA: Diagnosis not present

## 2024-01-06 DIAGNOSIS — I5033 Acute on chronic diastolic (congestive) heart failure: Secondary | ICD-10-CM | POA: Diagnosis not present

## 2024-01-06 DIAGNOSIS — I10 Essential (primary) hypertension: Secondary | ICD-10-CM | POA: Diagnosis not present

## 2024-01-06 LAB — URINALYSIS, ROUTINE W REFLEX MICROSCOPIC
Bilirubin Urine: NEGATIVE
Glucose, UA: >=500 mg/dL — AB
Hgb urine dipstick: NEGATIVE
Ketones, ur: NEGATIVE mg/dL
Leukocytes,Ua: NEGATIVE
Nitrite: NEGATIVE
Protein, ur: >=300 mg/dL — AB
Specific Gravity, Urine: 1.022 (ref 1.005–1.030)
pH: 5 (ref 5.0–8.0)

## 2024-01-06 LAB — RENAL FUNCTION PANEL
Albumin: 2.3 g/dL — ABNORMAL LOW (ref 3.5–5.0)
Anion gap: 12 (ref 5–15)
BUN: 36 mg/dL — ABNORMAL HIGH (ref 8–23)
CO2: 27 mmol/L (ref 22–32)
Calcium: 8.3 mg/dL — ABNORMAL LOW (ref 8.9–10.3)
Chloride: 101 mmol/L (ref 98–111)
Creatinine, Ser: 3.4 mg/dL — ABNORMAL HIGH (ref 0.44–1.00)
GFR, Estimated: 13 mL/min — ABNORMAL LOW (ref >60)
Glucose, Bld: 194 mg/dL — ABNORMAL HIGH (ref 70–99)
Phosphorus: 3.8 mg/dL (ref 2.5–4.6)
Potassium: 3.8 mmol/L (ref 3.5–5.1)
Sodium: 140 mmol/L (ref 135–145)

## 2024-01-06 LAB — GLUCOSE, CAPILLARY
Glucose-Capillary: 167 mg/dL — ABNORMAL HIGH (ref 70–99)
Glucose-Capillary: 191 mg/dL — ABNORMAL HIGH (ref 70–99)
Glucose-Capillary: 201 mg/dL — ABNORMAL HIGH (ref 70–99)
Glucose-Capillary: 205 mg/dL — ABNORMAL HIGH (ref 70–99)
Glucose-Capillary: 247 mg/dL — ABNORMAL HIGH (ref 70–99)
Glucose-Capillary: 290 mg/dL — ABNORMAL HIGH (ref 70–99)

## 2024-01-06 MED ORDER — ALBUTEROL SULFATE (2.5 MG/3ML) 0.083% IN NEBU: 2.5000 mg | INHALATION_SOLUTION | RESPIRATORY_TRACT | Status: DC | PRN

## 2024-01-06 MED ORDER — CARVEDILOL 6.25 MG PO TABS
6.2500 mg | ORAL_TABLET | Freq: Two times a day (BID) | ORAL | Status: DC
  Administered 2024-01-06 – 2024-01-10 (×8): 6.25 mg via ORAL
  Filled 2024-01-06 (×8): qty 1

## 2024-01-06 MED ORDER — INSULIN ASPART 100 UNIT/ML IJ SOLN
0.0000 [IU] | Freq: Three times a day (TID) | INTRAMUSCULAR | Status: DC
  Administered 2024-01-06: 8 [IU] via SUBCUTANEOUS
  Administered 2024-01-07 (×2): 3 [IU] via SUBCUTANEOUS
  Administered 2024-01-07 – 2024-01-08 (×2): 5 [IU] via SUBCUTANEOUS
  Administered 2024-01-08: 8 [IU] via SUBCUTANEOUS
  Administered 2024-01-08 – 2024-01-09 (×2): 5 [IU] via SUBCUTANEOUS
  Administered 2024-01-09: 8 [IU] via SUBCUTANEOUS
  Administered 2024-01-09: 5 [IU] via SUBCUTANEOUS
  Administered 2024-01-10 (×2): 11 [IU] via SUBCUTANEOUS

## 2024-01-06 NOTE — Progress Notes (Addendum)
 Patient Name: [name] Date of Encounter: 01/06/2024 [facility] Cardiologist: [name], MD   Interval Summary  .    78 yr old female with PMH of CKD V, type 2 DM, CVA, HTN, HLD, COPD, pulmonary nodules, tobacco use, PAD, brain aneurysm, who is followed by cardiology for acute on chronic diastolic heart failure, course complicated by progression of CKD V potentially needing dialysis.  Patient is eating breakfast this morning, feeling essentially unchanged. She is frustrated that her renal function is much worsened, she states her anxiety is extremely bad. She asks what she can do to help at this point.   Vital Signs .    Vitals:   01/05/24 1938 01/05/24 2006 01/06/24 0446 01/06/24 0716  BP: (!) 175/55 (!) 175/55 (!) 155/39 (!) 169/47  Pulse: 91 68 78 71  Resp: 18 16 18 16   Temp: 98.1 F (36.7 C)  98.4 F (36.9 C) 98.2 F (36.8 C)  TempSrc: Oral  Oral Oral  SpO2: 94% 95% 95%   Weight:   83.9 kg   Height:        Intake/Output Summary (Last 24 hours) at 01/06/2024 0811 Last data filed at 01/06/2024 0300 Gross per 24 hour  Intake 960 ml  Output 1150 ml  Net -190 ml      01/06/2024    4:46 AM 01/05/2024    5:30 AM 01/04/2024    5:03 PM  Last 3 Weights  Weight (lbs) 185 lb 186 lb 188 lb 9.6 oz  Weight (kg) 83.915 kg 84.369 kg 85.548 kg      Telemetry/ECG    Sinus rhythm, PVCs - Personally Reviewed  Physical Exam .   GEN: No acute distress.   Neck: No JVD Cardiac: RRR, no murmurs, rubs, or gallops.  Respiratory: Clear to auscultation bilaterally. On room air. Speaks full sentence.  GI: Soft, nontender, non-distended  MS: No leg edema   Assessment & Plan .     Acute on Chronic Diastolic Heart Failure  - presented with volume overload, worsening baseline CKD V  - BNP 518, CXR no acute finding, Cr 2.96, POA - Echo 4/16 showed LVEF 60-65%, no RWMA, grade I DD, mild LVH, normal RV, aortic sclerosis, IVC normal  - on PTA PO Lasix  80mg   BID/complaint, now placed on IV Lasix  80mg  BID, UOP decreasing to over the past 24 hours, weight 188.6 >185ib so far, Cr 2.98 >>3.4 today,  IV Lasix  has been stopped today by primary team which I agree, if renal index stabilized, may transition to PO loop diuretic,  consider nephrology consult, she may progress to need dialysis  - GDMT: limited by advanced renal disease, on amlodipine  10mg  and hydralazine  50mg  TID for HTN currently, BP remains high, added Imdur  60mg  yesterday, will add coreg  today at 6.125mg  BID   Elevated Troponin Coronary artery calcifications on CT  - hsTn 55>70>127. EKG no acute finding. Patient denies chest pain  - nuclear stress test in 2020 was a normal, low risk study  - CT chest from 06/2023 showed coronary artery calcifications and aortic atherosclerotic calcifications  - suspect demand ischemia in the setting of CHF and CKD V, she certainly has risk factor for CAD, not plan for invasive ischemic evaluation at this time due to lacking symptoms and patient's wish to avoid dialysis, continue medical therapy with ASA 81mg  daily and crestor  20mg  daily   HTN - BP elevated, anxiety probably plays a role  - currently on amlodipine  10mg  and  hydralazine  50mg  TID, will add Imdur  60mg  today, if remains high, may add coreg  next   Prolonged QT  - keep K >4 and Mag >2 - will repeat EKG today - avoid QT prolonging drugs   AKI on CKD V - Cr 2.98 >>3.4 today, IV lasix  paused, will need nephrology consult   Type 2 DM COPD CVA  PAD  Tobacco use  - per primary team    For questions or updates, please contact [facility] Please consult [email or web address] for contact info under     Signed, Xika Zhao, NP   History and all data above reviewed.  Patient examined.  I agree with the findings as above.  She said she was able to walk in the hallway and had a little less shortness of breath than when she was admitted.  Has not been having chest pain.  Creatinine is  elevated as above.  The patient exam reveals COR: Regular rate and rhythm, no murmurs,  Lungs: Clear to auscultation bilaterally,  Abd: Positive bowel sounds normal in frequency and pitch, no bruits, rebound, guarding, Ext 2+ pulses, mild bilateral lower extremity swelling.  All available labs, radiology testing, previous records reviewed. Agree with documented assessment and plan.  Acute on chronic diastolic heart failure creatinine is now bumped with diuretic so we will have to hold this.  She is oxygenating little bit better but does have diffuse wheezing and known underlying COPD and tobacco use.  We will defer to her primary team any further management of what is at least a partially contributory primary pulmonary component to her dyspnea.  Hypertension: Blood pressure is elevated. Coreg  was off and has been restarted with PM dose.    [name]  2:42 PM  01/06/2024

## 2024-01-06 NOTE — Progress Notes (Signed)
 Mobility Specialist Progress Note;   01/06/24 1355  Mobility  Activity Ambulated with assistance in hallway  Level of Assistance Standby assist, set-up cues, supervision of patient - no hands on  Assistive Device Four wheel walker  Distance Ambulated (ft) 400 ft  Activity Response Tolerated well  Mobility Referral Yes  Mobility visit 1 Mobility  Mobility Specialist Start Time (ACUTE ONLY) 1355  Mobility Specialist Stop Time (ACUTE ONLY) 1410  Mobility Specialist Time Calculation (min) (ACUTE ONLY) 15 min   Pt requesting to ambulate again. Required no physical assistance during ambulation, SV. VSS on RA. Pt returned back to bed with all needs met.   Janit Meline Mobility Specialist Please contact via SecureChat or [employer] [phone]

## 2024-01-06 NOTE — Inpatient Diabetes Management (Signed)
 Inpatient Diabetes Program Recommendations  AACE/ADA: New Consensus Statement on Inpatient Glycemic Control   Target Ranges:  Prepandial:   less than 140 mg/dL      Peak postprandial:   less than 180 mg/dL (1-2 hours)      Critically ill patients:  1

## 2024-01-06 NOTE — Progress Notes (Addendum)
 Progress Note   Patient: [name] ZOX:[id number] DOB: 28-Jan-1946 DOA: 01/03/2024     2 DOS: the patient was seen and examined on 01/06/2024   Brief hospital course: 78 yo female was admitted to the hospital with the working diagnosis of volume overload in the setting of chronic kidney disease.   78 yo female with the past medical history of stage V CKD, T2DM, CVA, dyslipidemia, hypertension, and peripheral vascular disease who presented with dyspnea. Reported few weeks of worsening lower extremity edema and progressive dyspnea on exertion.  12/21/23 she had right brachiocephalic fistula created in preparation for hemodialysis. Outpatient follow up on the day of admission she was found volume overloaded by her Nephrologist and was referred to the ED for further evaluation.  On her initial physical examination her blood pressure was 178/54, HR 65, RR 20 and 02 saturation 97%. Lungs with rales bilaterally, heart with S1 and S2 present and regular with positive systolic murmur, abdomen with no distention and positive bilateral lower extremity edema, pitting ++  Na 140, K 3,6 Cl 104 bicarbonate 26 bun 25 cr 2,96  BNP 518  High sensitive troponin 55, 70 and 127  Wbc 10,8 hgb 14.2 plt 247   Chest radiograph with mild cardiomegaly, with mild hilar vascular congestion with no effusions or infiltrates.   EKG 71 bpm, normal axis, normal intervals, qtc 491, sinus rhythm with no significant ST segment changes, negative T wave lead I and aVL (chronic changes).   Patient was placed on furosemide  with improvement in her volume status.   04/18 worsening renal function per serum cr, and furosemide  has been held.   Assessment and Plan: * CKD (chronic kidney disease) stage 5, GFR less than 15 ml/min (HCC) Improved volume status Worsening renal function with serum cr at 3,4 with K at 3,8 and serum bicarbonate at 36  Na 140 and P 3,8  Baseline serum cr between 2 to 3.   Hold on furosemide  today.   Caution with K repletion in the setting of reduced GFR.   Metabolic bone disease continue with calcitriol .   Acute on chronic diastolic CHF (congestive heart failure) (HCC) Echocardiogram with preserved LV systolic function with EF 60 to 65%, with mild LVH, RV systolic function preserved, no significant valvular disease. Normal size left and right atrium.  No pericardial effusion.    Urine output is 1,150 ml Systolic blood pressure 182/46  Patient has lost about 2 kg since admission.   Continue  afterload reduction with hydralazine / isosorbide  and blood pressure control with amlodipine .  Resumed low dose carvedilol .  Holding furosemide  for now.  Limited pharmacologic therapy due to acutely reduced GFR  Essential hypertension Resume hydralazine / isosorbide  and amlodipine  for blood pressure control  Type 2 diabetes mellitus with hyperlipidemia (HCC) Continue insulin  sliding scale for glucose cover and monitoring . Admission glucose not elevated, hold on basal insulin  for now.  Continue statin therapy.   Coronary artery calcification Elevated troponin due to volume overload and acute heart failure Patient with no chest pain or electrocardiographic changes Ruled out for acute coronary syndrome.  Emphysema of lung (HCC) No signs of acute exacerbation and will add as needed albuterol  nebs.  Continue bronchodilator therapy   Peripheral vascular disease due to secondary diabetes mellitus (HCC) Continue blood pressure control   History of CVA (cerebrovascular accident) Continue blood pressure control and statin therapy   COPD (chronic obstructive pulmonary disease) (HCC) Continue bronchodilator    Subjective: Patient has improvement in her dyspnea and her  edema has resolved.   Physical Exam: Vitals:   01/06/24 0446 01/06/24 0716 01/06/24 0816 01/06/24 1427  BP: (!) 155/39 (!) 169/47  (!) 136/43  Pulse: 78 71  75  Resp: 18 16  18   Temp: 98.4 F (36.9 C) 98.2 F (36.8 C)   98.1 F (36.7 C)  TempSrc: Oral Oral  Oral  SpO2: 95%  95%   Weight: 83.9 kg     Height:       Neurology awake and alert ENT with mild pallor Cardiovascular with S1 and S2 present and regular with no gallops, rubs or murmurs No JVD No lower extremity edema Respiratory with no rales or rhonchi, mild expiratory wheezing bilaterally.   Abdomen with no distention  Data Reviewed:    Family Communication: no family at the bedside   Disposition: Status is: Inpatient Remains inpatient appropriate because: renal function monitoring   Planned Discharge Destination: Home    Author: Albertus Alt, MD 01/06/2024 3:30 PM  For on call review [email or web address].

## 2024-01-06 NOTE — Plan of Care (Signed)

## 2024-01-06 NOTE — Progress Notes (Signed)
 Mobility Specialist Progress Note;   01/06/24 1036  Mobility  Activity Ambulated with assistance in hallway  Level of Assistance Standby assist, set-up cues, supervision of patient - no hands on  Assistive Device Four wheel walker  Distance Ambulated (ft) 400 ft  Activity Response Tolerated well  Mobility Referral Yes  Mobility visit 1 Mobility  Mobility Specialist Start Time (ACUTE ONLY) 1036  Mobility Specialist Stop Time (ACUTE ONLY) 1050  Mobility Specialist Time Calculation (min) (ACUTE ONLY) 14 min   Pt eager for mobility. Required no physical assistance during ambulation, SV. Pt in much better spirits once up and out of the room, stated she received meds for her anxiety. VSS throughout and no c/o SOB when asked. Pt requested to ambulate again this afternoon, will f/u as schedule permits. Pt returned back to bed with all needs met.   Janit Meline Mobility Specialist Please contact via SecureChat or [employer] [phone]

## 2024-01-06 NOTE — Assessment & Plan Note (Signed)
 Continue bronchodilator

## 2024-01-07 ENCOUNTER — Inpatient Hospital Stay (HOSPITAL_COMMUNITY)

## 2024-01-07 ENCOUNTER — Other Ambulatory Visit: Payer: Self-pay | Admitting: Nurse Practitioner

## 2024-01-07 DIAGNOSIS — N185 Chronic kidney disease, stage 5: Secondary | ICD-10-CM | POA: Diagnosis not present

## 2024-01-07 DIAGNOSIS — R7989 Other specified abnormal findings of blood chemistry: Secondary | ICD-10-CM | POA: Diagnosis not present

## 2024-01-07 DIAGNOSIS — I5033 Acute on chronic diastolic (congestive) heart failure: Secondary | ICD-10-CM | POA: Diagnosis not present

## 2024-01-07 DIAGNOSIS — I10 Essential (primary) hypertension: Secondary | ICD-10-CM | POA: Diagnosis not present

## 2024-01-07 DIAGNOSIS — I2510 Atherosclerotic heart disease of native coronary artery without angina pectoris: Secondary | ICD-10-CM | POA: Diagnosis not present

## 2024-01-07 LAB — RENAL FUNCTION PANEL
Albumin: 2.3 g/dL — ABNORMAL LOW (ref 3.5–5.0)
Anion gap: 10 (ref 5–15)
BUN: 38 mg/dL — ABNORMAL HIGH (ref 8–23)
CO2: 26 mmol/L (ref 22–32)
Calcium: 8.2 mg/dL — ABNORMAL LOW (ref 8.9–10.3)
Chloride: 101 mmol/L (ref 98–111)
Creatinine, Ser: 3.19 mg/dL — ABNORMAL HIGH (ref 0.44–1.00)
GFR, Estimated: 14 mL/min — ABNORMAL LOW (ref >60)
Glucose, Bld: 192 mg/dL — ABNORMAL HIGH (ref 70–99)
Phosphorus: 3.6 mg/dL (ref 2.5–4.6)
Potassium: 4.4 mmol/L (ref 3.5–5.1)
Sodium: 137 mmol/L (ref 135–145)

## 2024-01-07 LAB — GLUCOSE, CAPILLARY
Glucose-Capillary: 186 mg/dL — ABNORMAL HIGH (ref 70–99)
Glucose-Capillary: 220 mg/dL — ABNORMAL HIGH (ref 70–99)
Glucose-Capillary: 342 mg/dL — ABNORMAL HIGH (ref 70–99)

## 2024-01-07 MED ORDER — HYDRALAZINE HCL 50 MG PO TABS
100.0000 mg | ORAL_TABLET | Freq: Three times a day (TID) | ORAL | Status: DC
  Administered 2024-01-07 – 2024-01-10 (×9): 100 mg via ORAL
  Filled 2024-01-07 (×9): qty 2

## 2024-01-07 MED ORDER — IPRATROPIUM-ALBUTEROL 0.5-2.5 (3) MG/3ML IN SOLN
3.0000 mL | Freq: Four times a day (QID) | RESPIRATORY_TRACT | Status: DC
  Administered 2024-01-07 (×2): 3 mL via RESPIRATORY_TRACT
  Filled 2024-01-07 (×2): qty 3

## 2024-01-07 NOTE — Progress Notes (Signed)
 Progress Note   Patient: [name]:[id number] DOB: [birth year] DOA: 01/03/2024     3 DOS: the patient was seen and examined on 01/07/2024   Brief hospital course: Mrs. Heatherly was admitted to the hospital with the working diagnosis of volume overload in the setting of chronic kidney disease.   78 yo female with the past medical history of stage V CKD, T2DM, CVA, dyslipidemia, hypertension, and peripheral vascular disease who presented with dyspnea. Reported few weeks of worsening lower extremity edema and progressive dyspnea on exertion.  12/21/23 she had right brachiocephalic fistula created in preparation for hemodialysis. Outpatient follow up on the day of admission she was found volume overloaded by her Nephrologist and was referred to the ED for further evaluation.  On her initial physical examination her blood pressure was 178/54, HR 65, RR 20 and 02 saturation 97%. Lungs with rales bilaterally, heart with S1 and S2 present and regular with positive systolic murmur, abdomen with no distention and positive bilateral lower extremity edema, pitting ++  Na 140, K 3,6 Cl 104 bicarbonate 26 bun 25 cr 2,96  BNP 518  High sensitive troponin 55, 70 and 127  Wbc 10,8 hgb 14.2 plt 247   Chest radiograph with mild cardiomegaly, with mild hilar vascular congestion with no effusions or infiltrates.   EKG 71 bpm, normal axis, normal intervals, qtc 491, sinus rhythm with no significant ST segment changes, negative T wave lead I and aVL (chronic changes).   Patient was placed on furosemide  with improvement in her volume status.   04/18 worsening renal function per serum cr, and furosemide  has been held.  04/19 renal function more stable, blood pressure not yet controlled.   Assessment and Plan: * CKD (chronic kidney disease) stage 5, GFR less than 15 ml/min (HCC) Improved volume status Today serum cr is 3,19 with K at 4.4 and serum bicarbonate at 26  Na 137 and P 3,6   Continue to hold  on loop diuretic for now.  Patient has wheezing and dyspnea, likely from COPD more than from fluid overload.  Check chest radiograph.   Metabolic bone disease continue with calcitriol .   Acute on chronic diastolic CHF (congestive heart failure) (HCC) Echocardiogram with preserved LV systolic function with EF 60 to 65%, with mild LVH, RV systolic function preserved, no significant valvular disease. Normal size left and right atrium.  No pericardial effusion.    Urine output is 1,000 ml Systolic blood pressure 160    Continue  afterload reduction with hydralazine / isosorbide  and blood pressure control with amlodipine .  Resumed low dose carvedilol .  Holding furosemide  for now.  Limited pharmacologic therapy due to acutely reduced GFR  Essential hypertension Resume hydralazine / isosorbide  and amlodipine  for blood pressure control Low dose carvedilol .   Type 2 diabetes mellitus with hyperlipidemia (HCC) Continue insulin  sliding scale for glucose cover and monitoring . Admission glucose not elevated, hold on basal insulin  for now.  Continue statin therapy.   Coronary artery calcification Elevated troponin due to volume overload and acute heart failure Patient with no chest pain or electrocardiographic changes Ruled out for acute coronary syndrome.  Emphysema of lung (HCC) Positive wheezing, plan to continue Breztri  and will add scheduled douoneb. Check chest radiograph today.   Peripheral vascular disease due to secondary diabetes mellitus (HCC) Continue blood pressure control   History of CVA (cerebrovascular accident) Continue blood pressure control and statin therapy         Subjective: Patient with no edema, she continue to  have dyspnea on exertion and wheezing, no cough.,   Physical Exam: Vitals:   01/06/24 0816 01/06/24 1427 01/06/24 2020 01/07/24 0500  BP:  (!) 136/43 (!) 155/94 (!) 177/59  Pulse:  75 60 63  Resp:  18 18 18   Temp:  98.1 F (36.7 C) 98.2 F  (36.8 C) 98.1 F (36.7 C)  TempSrc:  Oral Oral Oral  SpO2: 95%   92%  Weight:    85 kg  Height:       Neurology awake and alert ENT with mild pallor Cardiovascular with S1 and S2 present and regular with no gallops, rubs or murmurs Respiratory with expiratory wheezing with no rales or rhonchi  Abdomen with no distention  Lower extremity with trace edema, ted hose in place  Data Reviewed:    Family Communication: no family at the beside   Disposition: Status is: Inpatient Remains inpatient appropriate because: not yet back to her baseline   Planned Discharge Destination: Home     Author: Albertus Alt, MD 01/07/2024 10:56 AM  For on call review [email or web address].

## 2024-01-07 NOTE — Progress Notes (Signed)
 Progress Note  Patient Name: [name] Date of Encounter: 01/07/2024  Primary Cardiologist: Eilleen Grates, MD   Subjective   Patient seen examined at bedside.  She was sitting up in the bed when I arrived.  She is concerned about her blood pressure.  Inpatient Medications    Scheduled Meds:  amLODipine   10 mg Oral Daily   aspirin  EC  81 mg Oral Daily   budeson-glycopyrrolate -formoterol   2 puff Inhalation BID   busPIRone   5 mg Oral BID   calcitRIOL   0.25 mcg Oral Daily   carvedilol   6.25 mg Oral BID WC   feeding supplement  237 mL Oral BID BM   heparin   5,000 Units Subcutaneous Q8H   hydrALAZINE   50 mg Oral Q8H   insulin  aspart  0-15 Units Subcutaneous TID WC   isosorbide  mononitrate  60 mg Oral Daily   kidney failure book   Does not apply Once   rOPINIRole   0.25 mg Oral QHS   rosuvastatin   20 mg Oral Daily   Continuous Infusions:  PRN Meds: acetaminophen  **OR** acetaminophen , albuterol , ALPRAZolam , hydrALAZINE    Vital Signs    Vitals:   01/06/24 0816 01/06/24 1427 01/06/24 2020 01/07/24 0500  BP:  (!) 136/43 (!) 155/94 (!) 177/59  Pulse:  75 60 63  Resp:  18 18 18   Temp:  98.1 F (36.7 C) 98.2 F (36.8 C) 98.1 F (36.7 C)  TempSrc:  Oral Oral Oral  SpO2: 95%   92%  Weight:    85 kg  Height:        Intake/Output Summary (Last 24 hours) at 01/07/2024 1004 Last data filed at 01/07/2024 0103 Gross per 24 hour  Intake 240 ml  Output 1000 ml  Net -760 ml   Filed Weights   01/05/24 0530 01/06/24 0446 01/07/24 0500  Weight: 84.4 kg 83.9 kg 85 kg    Telemetry    Sinus bradycardia - Personally Reviewed  ECG    none - Personally Reviewed  Physical Exam    General: Comfortable, sitting up in the bed Head: Atraumatic, normal size  Eyes: PEERLA, EOMI  Neck: Supple, normal JVD Cardiac: Normal S1, S2; RRR; no murmurs, rubs, or gallops Lungs: Diffuse wheezing bilaterally Abd: Soft, nontender, no hepatomegaly  Ext: warm, no edema  Labs     Chemistry Recent Labs  Lab 01/03/24 1300 01/04/24 0308 01/05/24 0417 01/06/24 0412 01/07/24 0504  NA 140   < > 141 140 137  K 3.6   < > 3.5 3.8 4.4  CL 104   < > 106 101 101  CO2 26   < > 28 27 26   GLUCOSE 100*   < > 94 194* 192*  BUN 25*   < > 29* 36* 38*  CREATININE 2.96*   < > 2.98* 3.40* 3.19*  CALCIUM  8.5*   < > 8.6* 8.3* 8.2*  PROT 5.8*  --   --   --   --   ALBUMIN  2.4*  --   --  2.3* 2.3*  AST 29  --   --   --   --   ALT 18  --   --   --   --   ALKPHOS 71  --   --   --   --   BILITOT 0.5  --   --   --   --   GFRNONAA 16*   < > 16* 13* 14*  ANIONGAP 10   < > 7 12 10    < > =  values in this interval not displayed.     Hematology Recent Labs  Lab 01/03/24 1300  WBC 10.8*  RBC 4.39  HGB 14.2  HCT 43.8  MCV 99.8  MCH 32.3  MCHC 32.4  RDW 11.2*  PLT 247    Cardiac EnzymesNo results for input(s): "TROPONINI" in the last 168 hours. No results for input(s): "TROPIPOC" in the last 168 hours.   BNP Recent Labs  Lab 01/03/24 1300  BNP 518.1*     DDimer No results for input(s): "DDIMER" in the last 168 hours.   Radiology    No results found.  Cardiac Studies   Echo   Patient Profile     78 y.o. female with history of CKD V, type 2 DM, CVA, HTN, HLD, COPD, pulmonary nodules, tobacco use, PAD, brain aneurysm.   Assessment & Plan    Acute on chronic diastolic heart failure Elevated troponin suspect type II mismatch demand ischemia. Coronary artery calcification on CT scan Accelerated hypertension Prolonged QT Acute on chronic CKD stage V Type 2 diabetes  Her blood pressure is elevated Coreg  was restarted yesterday.  But she is bradycardic I am not going to increase her Coreg  dosing.  Will increase her hydralazine  to 100 mg 3 times daily and if needed will adjust with other antihypertensives tomorrow.  No need for any further ischemic evaluation here.  Does not complain of any anginal symptoms.  Creatinine trending down 3.19 today.  Will hold off  again on diuretics.  Do suspect her dyspnea on exertion is multifactorial including COPD as she is actively wheezing.      For questions or updates, please contact [facility] Please consult [email or web address] for contact info under Cardiology/STEMI.      Signed, Mahika Vanvoorhis, DO  01/07/2024, 10:04 AM

## 2024-01-08 DIAGNOSIS — I10 Essential (primary) hypertension: Secondary | ICD-10-CM | POA: Diagnosis not present

## 2024-01-08 DIAGNOSIS — I5033 Acute on chronic diastolic (congestive) heart failure: Secondary | ICD-10-CM | POA: Diagnosis not present

## 2024-01-08 DIAGNOSIS — I2510 Atherosclerotic heart disease of native coronary artery without angina pectoris: Secondary | ICD-10-CM | POA: Diagnosis not present

## 2024-01-08 DIAGNOSIS — N185 Chronic kidney disease, stage 5: Secondary | ICD-10-CM | POA: Diagnosis not present

## 2024-01-08 LAB — RENAL FUNCTION PANEL
Albumin: 2.2 g/dL — ABNORMAL LOW (ref 3.5–5.0)
Anion gap: 10 (ref 5–15)
BUN: 47 mg/dL — ABNORMAL HIGH (ref 8–23)
CO2: 20 mmol/L — ABNORMAL LOW (ref 22–32)
Calcium: 8.3 mg/dL — ABNORMAL LOW (ref 8.9–10.3)
Chloride: 106 mmol/L (ref 98–111)
Creatinine, Ser: 3.42 mg/dL — ABNORMAL HIGH (ref 0.44–1.00)
GFR, Estimated: 13 mL/min — ABNORMAL LOW (ref >60)
Glucose, Bld: 193 mg/dL — ABNORMAL HIGH (ref 70–99)
Phosphorus: 4.1 mg/dL (ref 2.5–4.6)
Potassium: 4.7 mmol/L (ref 3.5–5.1)
Sodium: 136 mmol/L (ref 135–145)

## 2024-01-08 LAB — GLUCOSE, CAPILLARY
Glucose-Capillary: 215 mg/dL — ABNORMAL HIGH (ref 70–99)
Glucose-Capillary: 299 mg/dL — ABNORMAL HIGH (ref 70–99)
Glucose-Capillary: 214 mg/dL — ABNORMAL HIGH (ref 70–99)
Glucose-Capillary: 408 mg/dL — ABNORMAL HIGH (ref 70–99)

## 2024-01-08 MED ORDER — ISOSORBIDE MONONITRATE ER 60 MG PO TB24
120.0000 mg | ORAL_TABLET | Freq: Every day | ORAL | Status: DC
  Administered 2024-01-09 – 2024-01-10 (×2): 120 mg via ORAL
  Filled 2024-01-08 (×3): qty 2

## 2024-01-08 MED ORDER — INSULIN ASPART 100 UNIT/ML IJ SOLN
10.0000 [IU] | Freq: Once | INTRAMUSCULAR | Status: AC
  Administered 2024-01-08: 10 [IU] via SUBCUTANEOUS

## 2024-01-08 MED ORDER — FUROSEMIDE 10 MG/ML IJ SOLN
80.0000 mg | Freq: Once | INTRAMUSCULAR | Status: AC
  Administered 2024-01-08: 80 mg via INTRAVENOUS
  Filled 2024-01-08: qty 8

## 2024-01-08 MED ORDER — ISOSORBIDE MONONITRATE ER 60 MG PO TB24
60.0000 mg | ORAL_TABLET | Freq: Once | ORAL | Status: AC
  Administered 2024-01-08: 60 mg via ORAL
  Filled 2024-01-08: qty 1

## 2024-01-08 NOTE — Progress Notes (Signed)
 Mobility Specialist Progress Note:    01/08/24 1454  Mobility  Activity Ambulated with assistance in hallway  Level of Assistance Standby assist, set-up cues, supervision of patient - no hands on  Assistive Device Four wheel walker  Distance Ambulated (ft) 400 ft  Activity Response Tolerated well  Mobility Referral Yes  Mobility visit 1 Mobility  Mobility Specialist Start Time (ACUTE ONLY) 1225  Mobility Specialist Stop Time (ACUTE ONLY) 1237  Mobility Specialist Time Calculation (min) (ACUTE ONLY) 12 min   Received pt in bed having no complaints and agreeable to mobility. Pt was asymptomatic throughout ambulation and returned to room w/o fault. Left seated EOB w/ call bell in reach and all needs met.   Inetta Manes Mobility Specialist  Please contact vis Secure Chat or  Rehab Office [phone]

## 2024-01-08 NOTE — Progress Notes (Signed)
 Progress Note   Patient: [name] XBJ:[id number] DOB: 29-Mar-1946 DOA: 01/03/2024     4 DOS: the patient was seen and examined on 01/08/2024   Brief hospital course: [name] was admitted to the hospital with the working diagnosis of volume overload in the setting of chronic kidney disease.   78 yo female with the past medical history of stage V CKD, T2DM, CVA, dyslipidemia, hypertension, and peripheral vascular disease who presented with dyspnea. Reported few weeks of worsening lower extremity edema and progressive dyspnea on exertion.  12/21/23 she had right brachiocephalic fistula created in preparation for hemodialysis. Outpatient follow up on the day of admission she was found volume overloaded by her Nephrologist and was referred to the ED for further evaluation.  On her initial physical examination her blood pressure was 178/54, HR 65, RR 20 and 02 saturation 97%. Lungs with rales bilaterally, heart with S1 and S2 present and regular with positive systolic murmur, abdomen with no distention and positive bilateral lower extremity edema, pitting ++  Na 140, K 3,6 Cl 104 bicarbonate 26 bun 25 cr 2,96  BNP 518  High sensitive troponin 55, 70 and 127  Wbc 10,8 hgb 14.2 plt 247   Chest radiograph with mild cardiomegaly, with mild hilar vascular congestion with no effusions or infiltrates.   EKG 71 bpm, normal axis, normal intervals, qtc 491, sinus rhythm with no significant ST segment changes, negative T wave lead I and aVL (chronic changes).   Patient was placed on furosemide  with improvement in her volume status.   04/18 worsening renal function per serum cr, and furosemide  has been held.  04/19 renal function more stable, blood pressure not yet controlled.  04/20 patient with dyspnea and orthopnea, resumed IV furosemide    Assessment and Plan: * CKD (chronic kidney disease) stage 5, GFR less than 15 ml/min (HCC) Today with worsening serum cr and signs of volume overload.   04/19 Chest radiograph with cephalization of the vasculature and hilar vascular congestion   Worsening renal function with serum cr at 3.42 with K at 4,7 and serum bicarbonate at 20  Na 136 and Mg 2.2   Will resume IV furosemide . 80 mg IV x1 and will follow up urine output, if not significant will repeat dose this pm.   Metabolic bone disease continue with calcitriol .   Acute on chronic diastolic CHF (congestive heart failure) (HCC) Echocardiogram with preserved LV systolic function with EF 60 to 65%, with mild LVH, RV systolic function preserved, no significant valvular disease. Normal size left and right atrium.  No pericardial effusion.    Urine output is 1,300 ml Systolic blood pressure 160    Continue  afterload reduction with hydralazine / isosorbide  and blood pressure control with amlodipine .  Low dose carvedilol .   Limited pharmacologic therapy due to acutely reduced GFR  Essential hypertension Continue with hydralazine / isosorbide  and amlodipine  for blood pressure control Low dose carvedilol .   Type 2 diabetes mellitus with hyperlipidemia (HCC) Continue insulin  sliding scale for glucose cover and monitoring . Fasting glucose this am 193 mg/dl.  Continue statin therapy.   Coronary artery calcification Elevated troponin due to volume overload and acute heart failure Patient with no chest pain or electrocardiographic changes Ruled out for acute coronary syndrome.  Emphysema of lung (HCC) Positive wheezing, plan to continue Breztri   No signs of exacerbation, no improving dyspnea with duoneb, will discontinue   Peripheral vascular disease due to secondary diabetes mellitus (HCC) Continue blood pressure control   History of  CVA (cerebrovascular accident) Continue blood pressure control and statin therapy         Subjective: Patient continue to have dyspnea on exertion, positive orthopnea,no chest pain and no cough   Physical Exam: Vitals:   01/07/24 0500  01/07/24 2046 01/07/24 2144 01/08/24 0421  BP: (!) 177/59  (!) 155/54 (!) 154/51  Pulse: 63 62 60 (!) 59  Resp: 18 18 16 18   Temp: 98.1 F (36.7 C)  98.6 F (37 C) 98 F (36.7 C)  TempSrc: Oral  Oral Oral  SpO2: 92% 92% 96% 96%  Weight: 85 kg   85.8 kg  Height:       Neurology awake and alert ENT with mild pallor with no icterus Cardiovascular with S1 and S2 present and regular with no gallops, rubs or murmurs Respiratory with no rales or wheezing, no rhonchi  Abdomen with no distention  Positive lower extremity edema + bilateral at the ankles.  Data Reviewed:    Family Communication: no family at the bedside   Disposition: Status is: Inpatient Remains inpatient appropriate because: IV diuresis   Planned Discharge Destination: Home      Author: Albertus Alt, MD 01/08/2024 10:14 AM  For on call review [email or web address].

## 2024-01-08 NOTE — Progress Notes (Signed)
 Progress Note  Patient Name: MCCARTNEY BRUCKS Date of Encounter: 01/08/2024  Primary Cardiologist: Eilleen Grates, MD   Subjective   Patient seen examined at bedside.  She was sitting up in the bed when I arrived.  She is concerned about her blood pressure.  Inpatient Medications    Scheduled Meds:  amLODipine   10 mg Oral Daily   aspirin  EC  81 mg Oral Daily   budeson-glycopyrrolate -formoterol   2 puff Inhalation BID   busPIRone   5 mg Oral BID   calcitRIOL   0.25 mcg Oral Daily   carvedilol   6.25 mg Oral BID WC   feeding supplement  237 mL Oral BID BM   furosemide   80 mg Intravenous Once   heparin   5,000 Units Subcutaneous Q8H   hydrALAZINE   100 mg Oral Q8H   insulin  aspart  0-15 Units Subcutaneous TID WC   isosorbide  mononitrate  60 mg Oral Daily   kidney failure book   Does not apply Once   rOPINIRole   0.25 mg Oral QHS   rosuvastatin   20 mg Oral Daily   Continuous Infusions:  PRN Meds: acetaminophen  **OR** acetaminophen , albuterol , ALPRAZolam , hydrALAZINE    Vital Signs    Vitals:   01/07/24 0500 01/07/24 2046 01/07/24 2144 01/08/24 0421  BP: (!) 177/59  (!) 155/54 (!) 154/51  Pulse: 63 62 60 (!) 59  Resp: 18 18 16 18   Temp: 98.1 F (36.7 C)  98.6 F (37 C) 98 F (36.7 C)  TempSrc: Oral  Oral Oral  SpO2: 92% 92% 96% 96%  Weight: 85 kg   85.8 kg  Height:        Intake/Output Summary (Last 24 hours) at 01/08/2024 0930 Last data filed at 01/08/2024 0500 Gross per 24 hour  Intake 240 ml  Output 1300 ml  Net -1060 ml   Filed Weights   01/06/24 0446 01/07/24 0500 01/08/24 0421  Weight: 83.9 kg 85 kg 85.8 kg    Telemetry    Sinus bradycardia - Personally Reviewed  ECG    none - Personally Reviewed  Physical Exam    General: Comfortable, sitting up in the bed Head: Atraumatic, normal size  Eyes: PEERLA, EOMI  Neck: Supple, normal JVD Cardiac: Normal S1, S2; RRR; no murmurs, rubs, or gallops Lungs: Diffuse wheezing bilaterally Abd: Soft, nontender,  no hepatomegaly  Ext: warm, no edema  Labs    Chemistry Recent Labs  Lab 01/03/24 1300 01/04/24 0308 01/06/24 0412 01/07/24 0504 01/08/24 0522  NA 140   < > 140 137 136  K 3.6   < > 3.8 4.4 4.7  CL 104   < > 101 101 106  CO2 26   < > 27 26 20*  GLUCOSE 100*   < > 194* 192* 193*  BUN 25*   < > 36* 38* 47*  CREATININE 2.96*   < > 3.40* 3.19* 3.42*  CALCIUM  8.5*   < > 8.3* 8.2* 8.3*  PROT 5.8*  --   --   --   --   ALBUMIN  2.4*  --  2.3* 2.3* 2.2*  AST 29  --   --   --   --   ALT 18  --   --   --   --   ALKPHOS 71  --   --   --   --   BILITOT 0.5  --   --   --   --   GFRNONAA 16*   < > 13* 14* 13*  ANIONGAP  10   < > 12 10 10    < > = values in this interval not displayed.     Hematology Recent Labs  Lab 01/03/24 1300  WBC 10.8*  RBC 4.39  HGB 14.2  HCT 43.8  MCV 99.8  MCH 32.3  MCHC 32.4  RDW 11.2*  PLT 247    Cardiac EnzymesNo results for input(s): "TROPONINI" in the last 168 hours. No results for input(s): "TROPIPOC" in the last 168 hours.   BNP Recent Labs  Lab 01/03/24 1300  BNP 518.1*     DDimer No results for input(s): "DDIMER" in the last 168 hours.   Radiology    DG Chest 1 View Result Date: 01/07/2024 CLINICAL DATA:  Follow-up exam.  Shortness of breath. EXAM: CHEST  1 VIEW COMPARISON:  01/03/2024. FINDINGS: The heart is mildly enlarged and the mediastinal contour is within normal limits. There is atherosclerotic calcification of the aorta. Minimal atelectasis or scarring is present at the left lung base. A calcified granuloma is noted at the right lung base. No effusion or pneumothorax is seen. No acute osseous abnormality. IMPRESSION: Minimal atelectasis or scarring at the left lung base. Electronically Signed   By: Wyvonnia Heimlich M.D.   On: 01/07/2024 16:57    Cardiac Studies   Echo   Patient Profile     78 y.o. female with history of CKD V, type 2 DM, CVA, HTN, HLD, COPD, pulmonary nodules, tobacco use, PAD, brain aneurysm.   Assessment &  Plan    Acute on chronic diastolic heart failure Elevated troponin suspect type II mismatch demand ischemia. Coronary artery calcification on CT scan Accelerated hypertension Prolonged QT Acute on chronic CKD stage V Type 2 diabetes  Despite the increase in the hydralazine  to 100 mg twice daily yesterday blood pressure still remains elevated.  I am going to increase the isosorbide  mononitrate to 120 mg daily.  Given extra dose of 60 mg today to then start her 120 tomorrow.  She is on carvedilol  but she is bradycardic.  She is on 6.25 mg twice daily.  We might consider clonidine  at nighttime 0.1 mg daily.  If we do this we will have to monitor her heart rate as there could be a small effect on the heart rate as well. Unfortunately based on kidney function ACE/ARB's in spironolactone is not appropriate at this time  She clinically still does have some volume - she may benefit from a high dose Lasix  one time dose to then monitor cr.    No need for any further ischemic evaluation here.  Does not complain of any anginal symptoms.  Creatinine went back up slightly 3.42  Do suspect her dyspnea on exertion is multifactorial including COPD       For questions or updates, please contact [facility] Please consult [email or web address] for contact info under Cardiology/STEMI.      Signed, Joesph Marcy, DO  01/08/2024, 9:30 AM

## 2024-01-09 DIAGNOSIS — N185 Chronic kidney disease, stage 5: Secondary | ICD-10-CM | POA: Diagnosis not present

## 2024-01-09 DIAGNOSIS — R7989 Other specified abnormal findings of blood chemistry: Secondary | ICD-10-CM | POA: Diagnosis not present

## 2024-01-09 DIAGNOSIS — I10 Essential (primary) hypertension: Secondary | ICD-10-CM | POA: Diagnosis not present

## 2024-01-09 DIAGNOSIS — I5033 Acute on chronic diastolic (congestive) heart failure: Secondary | ICD-10-CM | POA: Diagnosis not present

## 2024-01-09 DIAGNOSIS — I2510 Atherosclerotic heart disease of native coronary artery without angina pectoris: Secondary | ICD-10-CM | POA: Diagnosis not present

## 2024-01-09 LAB — RENAL FUNCTION PANEL
Albumin: 2.3 g/dL — ABNORMAL LOW (ref 3.5–5.0)
Anion gap: 9 (ref 5–15)
BUN: 53 mg/dL — ABNORMAL HIGH (ref 8–23)
CO2: 24 mmol/L (ref 22–32)
Calcium: 8.8 mg/dL — ABNORMAL LOW (ref 8.9–10.3)
Chloride: 103 mmol/L (ref 98–111)
Creatinine, Ser: 3.39 mg/dL — ABNORMAL HIGH (ref 0.44–1.00)
GFR, Estimated: 13 mL/min — ABNORMAL LOW (ref >60)
Glucose, Bld: 258 mg/dL — ABNORMAL HIGH (ref 70–99)
Phosphorus: 4.1 mg/dL (ref 2.5–4.6)
Potassium: 4.4 mmol/L (ref 3.5–5.1)
Sodium: 136 mmol/L (ref 135–145)

## 2024-01-09 LAB — GLUCOSE, CAPILLARY
Glucose-Capillary: 244 mg/dL — ABNORMAL HIGH (ref 70–99)
Glucose-Capillary: 293 mg/dL — ABNORMAL HIGH (ref 70–99)
Glucose-Capillary: 218 mg/dL — ABNORMAL HIGH (ref 70–99)
Glucose-Capillary: 286 mg/dL — ABNORMAL HIGH (ref 70–99)

## 2024-01-09 MED ORDER — FUROSEMIDE 40 MG PO TABS
80.0000 mg | ORAL_TABLET | Freq: Two times a day (BID) | ORAL | Status: DC
  Administered 2024-01-09 – 2024-01-10 (×2): 80 mg via ORAL
  Filled 2024-01-09 (×3): qty 2

## 2024-01-09 NOTE — Progress Notes (Signed)
 Mobility Specialist Progress Note:   01/09/24 1500  Mobility  Activity Ambulated with assistance in hallway  Level of Assistance Standby assist, set-up cues, supervision of patient - no hands on  Assistive Device Four wheel walker  Distance Ambulated (ft) 500 ft  Activity Response Tolerated well  Mobility Referral Yes  Mobility visit 1 Mobility  Mobility Specialist Start Time (ACUTE ONLY) 1410  Mobility Specialist Stop Time (ACUTE ONLY) 1425  Mobility Specialist Time Calculation (min) (ACUTE ONLY) 15 min   Received pt in bed having no complaints and agreeable to mobility. Pt was asymptomatic throughout ambulation and returned to room w/o fault. Left seated EOB w/ call bell in reach and all needs met. Family in room.  Inetta Manes Mobility Specialist  Please contact vis Secure Chat or  Rehab Office [phone]

## 2024-01-09 NOTE — Plan of Care (Signed)
  Problem: Coping: Goal: Ability to adjust to condition or change in health will improve Outcome: Progressing   Problem: Fluid Volume: Goal: Ability to maintain a balanced intake and output will improve Outcome: Progressing   Problem: Metabolic: Goal: Ability to maintain appropriate glucose levels will improve Outcome: Progressing   Problem: Clinical Measurements: Goal: Ability to maintain clinical measurements within normal limits will improve Outcome: Progressing Goal: Diagnostic test results will improve Outcome: Progressing   Problem: Coping: Goal: Level of anxiety will decrease Outcome: Progressing   Problem: Pain Managment: Goal: General experience of comfort will improve and/or be controlled Outcome: Progressing   Problem: Health Behavior/Discharge Planning: Goal: Ability to identify and utilize available resources and services will improve Outcome: Completed/Met Goal: Ability to manage health-related needs will improve Outcome: Completed/Met   Problem: Nutritional: Goal: Maintenance of adequate nutrition will improve Outcome: Completed/Met   Problem: Tissue Perfusion: Goal: Adequacy of tissue perfusion will improve Outcome: Completed/Met   Problem: Education: Goal: Knowledge of General Education information will improve Description: Including pain rating scale, medication(s)/side effects and non-pharmacologic comfort measures Outcome: Completed/Met   Problem: Health Behavior/Discharge Planning: Goal: Ability to manage health-related needs will improve Outcome: Completed/Met   Problem: Nutrition: Goal: Adequate nutrition will be maintained Outcome: Completed/Met   Problem: Elimination: Goal: Will not experience complications related to bowel motility Outcome: Completed/Met Goal: Will not experience complications related to urinary retention Outcome: Completed/Met

## 2024-01-09 NOTE — Progress Notes (Addendum)
 Progress Note   Patient: [name] ZOX:[id number] DOB: 04/25/1946 DOA: 01/03/2024     5 DOS: the patient was seen and examined on 01/09/2024   Brief hospital course: [name] was admitted to the hospital with the working diagnosis of volume overload in the setting of chronic kidney disease.   78 yo female with the past medical history of stage V CKD, T2DM, CVA, dyslipidemia, hypertension, and peripheral vascular disease who presented with dyspnea. Reported few weeks of worsening lower extremity edema and progressive dyspnea on exertion.  12/21/23 she had right brachiocephalic fistula created in preparation for hemodialysis. Outpatient follow up on the day of admission she was found volume overloaded by her Nephrologist and was referred to the ED for further evaluation.  On her initial physical examination her blood pressure was 178/54, HR 65, RR 20 and 02 saturation 97%. Lungs with rales bilaterally, heart with S1 and S2 present and regular with positive systolic murmur, abdomen with no distention and positive bilateral lower extremity edema, pitting ++  Na 140, K 3,6 Cl 104 bicarbonate 26 bun 25 cr 2,96  BNP 518  High sensitive troponin 55, 70 and 127  Wbc 10,8 hgb 14.2 plt 247   Chest radiograph with mild cardiomegaly, with mild hilar vascular congestion with no effusions or infiltrates.   EKG 71 bpm, normal axis, normal intervals, qtc 491, sinus rhythm with no significant ST segment changes, negative T wave lead I and aVL (chronic changes).   Patient was placed on furosemide  with improvement in her volume status.   04/18 worsening renal function per serum cr, and furosemide  has been held.  04/19 renal function more stable, blood pressure not yet controlled.  04/20 patient with dyspnea and orthopnea, resumed IV furosemide   04/21 resume oral loop diuretic, possible discharge tomorrow if renal function and blood pressure stable.   Assessment and Plan: * CKD (chronic kidney  disease) stage 5, GFR less than 15 ml/min (HCC) 04/19 Chest radiograph with cephalization of the vasculature and hilar vascular congestion   Renal function today with serum cr at 3.39 with K at 4,4 and serum bicarbonate at 24 Na 136 and P 4,4 BUN 53 (BUN has been rising since admission)   Resume diuretic therapy furosemide  80 mg bid and follow up renal function and electrolytes in am.  Patient will need close follow up as outpatient.   Metabolic bone disease continue with calcitriol .   Acute on chronic diastolic CHF (congestive heart failure) (HCC) Echocardiogram with preserved LV systolic function with EF 60 to 65%, with mild LVH, RV systolic function preserved, no significant valvular disease. Normal size left and right atrium.  No pericardial effusion.    Urine output is 1,550 ml Systolic blood pressure 160 mmHg range.    Continue  afterload reduction with hydralazine / isosorbide  and blood pressure control with amlodipine .  Low dose carvedilol .   Limited pharmacologic therapy due to acutely reduced GFR  Essential hypertension Continue with hydralazine / isosorbide  and amlodipine  for blood pressure control Low dose carvedilol .   Type 2 diabetes mellitus with hyperlipidemia (HCC) Continue insulin  sliding scale for glucose cover and monitoring . Uncontrolled hyperglycemia with fasting glucose today 258 mg/dl  In the setting of worsening GFR will hold on further insulin  adjustments for now.  Continue statin therapy.   Coronary artery calcification Elevated troponin due to volume overload and acute heart failure Patient with no chest pain or electrocardiographic changes Ruled out for acute coronary syndrome.  Emphysema of lung (HCC) Positive wheezing, plan  to continue Breztri   No signs of exacerbation, no improving dyspnea with duoneb, will discontinue   Peripheral vascular disease due to secondary diabetes mellitus (HCC) Continue blood pressure control   History of CVA  (cerebrovascular accident) Continue blood pressure control and statin therapy         Subjective: Patient is feeling better, continue to have dyspnea on exertion but improved from admission, no chest pain, no nausea or vomiting   Physical Exam: Vitals:   01/09/24 0801 01/09/24 0811 01/09/24 1256 01/09/24 1404  BP: (!) 164/46  (!) 156/40 (!) 168/42  Pulse: 66   62  Resp: 19   18  Temp: 98.1 F (36.7 C)   97.8 F (36.6 C)  TempSrc: Oral   Oral  SpO2: 97% 95%  95%  Weight:      Height:       Neurology awake and alert ENT with mild pallor Cardiovascular with S1 and S2 present and regular with no gallops, rubs or murmurs Respiratory with no rales or wheezing, no rhonchi Abdomen with no distention  Lower extremity edema pitting +  Data Reviewed:    Family Communication: her husband is at the bedside   Disposition: Status is: Inpatient Remains inpatient appropriate because: volume overload   Planned Discharge Destination: Home     Author: Albertus Alt, MD 01/09/2024 4:02 PM  For on call review [email or web address].

## 2024-01-09 NOTE — Care Management Important Message (Signed)
 Important Message  Patient Details  Name: [name] MRN: [id number] Date of Birth: 1946-05-02   Important Message Given:  Yes - Medicare IM     Janith Melnick 01/09/2024, 10:31 AM

## 2024-01-09 NOTE — Progress Notes (Signed)
 Heart Failure Navigator Progress Note  Assessed for Heart & Vascular TOC clinic readiness.  Patient does not meet criteria due to EF 60-65%, CKD IV - V , No HF TOC per Dr. Arrien. .   Navigator will sign off at this time.   Randie Bustle, BSN, Scientist, clinical (histocompatibility and immunogenetics) Only

## 2024-01-09 NOTE — Progress Notes (Signed)
 Mobility Specialist Progress Note;   01/09/24 0932  Mobility  Activity Ambulated with assistance in hallway  Level of Assistance Standby assist, set-up cues, supervision of patient - no hands on  Assistive Device Four wheel walker  Distance Ambulated (ft) 225 ft  Activity Response Tolerated well  Mobility Referral Yes  Mobility visit 1 Mobility  Mobility Specialist Start Time (ACUTE ONLY) 0932  Mobility Specialist Stop Time (ACUTE ONLY) 0943  Mobility Specialist Time Calculation (min) (ACUTE ONLY) 11 min   Pt agreeable to mobility. Required no physical assistance during ambulation, SV. Requested to use BR at BOS, void successful. VSS throughout and no c/o when asked. Pt returned back to sitting on EOB with all needs met. Eager to ambulate again this afternoon, will f/u as schedule permits.   [name] Mobility Specialist Please contact via SecureChat or [employer] [phone]

## 2024-01-09 NOTE — Progress Notes (Addendum)
 Patient Name: [name] Date of Encounter: 01/09/2024 [facility] Cardiologist: Eilleen Grates, MD   Interval Summary  .    Patient feeling overall tired this morning. Is discouraged by the state of her health/persistently elevated BP. Reports some dizziness with ambulation.   Vital Signs .    Vitals:   01/08/24 1737 01/08/24 2059 01/08/24 2137 01/09/24 0343  BP: (!) 183/41 (!) 167/43  (!) 149/51  Pulse: 66 60  62  Resp:  16  18  Temp:  98 F (36.7 C)  98.2 F (36.8 C)  TempSrc:  Oral  Oral  SpO2:  96% 96% 96%  Weight:    85.9 kg  Height:        Intake/Output Summary (Last 24 hours) at 01/09/2024 0721 Last data filed at 01/09/2024 0347 Gross per 24 hour  Intake 480 ml  Output 1550 ml  Net -1070 ml      01/09/2024    3:43 AM 01/08/2024    4:21 AM 01/07/2024    5:00 AM  Last 3 Weights  Weight (lbs) 189 lb 6.4 oz 189 lb 1.6 oz 187 lb 6.4 oz  Weight (kg) 85.911 kg 85.775 kg 85.004 kg      Telemetry/ECG    Sinus rhythm - Personally Reviewed  Physical Exam .   GEN: No acute distress.   Neck: No JVD Cardiac: RRR, no murmurs, rubs, or gallops.  Respiratory: Clear to auscultation bilaterally. GI: Soft, nontender, non-distended  MS: 1+ LE edema bilaterally to ankles  Assessment & Plan .   Patient admitted with hypervolemia/hypertension in setting of CKD V  Resistant Hypertension Systolic BP remains elevated though improved. Options remain very limited by CKD and HR. Suspect that low diastolic BP is causing some of her dizziness. Will likely need to compromise with BP management to ensure that diastolic BP is not too low. Continue Coreg  6.25mg  BID Continue Imdure 120mg  daily, Hydralazine  100mg  TID, Amlodipine  10mg  daily.   Coronary atherosclerosis Elevated troponin hsTn 55>70>127. Prior CT chest from October 2024 with coronary atherosclerosis. Prior stress testing in 2020 negative/low risk.  Continue ASA and Crestor  with no plans for ischemic  evaluation due to lack of symptoms and patient desire to avoid dialysis.   Acute on chronic diastolic CHF Patient presented with volume overload, worsening baseline CKD V. BNP 518. Patient initially diuresed with IV lasix , subsequently discontinued with rising creatinine. Echo with LVEF 60-65%, mild LVH, preserved RV function. Lasix  dosed again yesterday by primary team with net output . Generally euvolemic appearing with minimal edema in lower extremities. With stable creatinine and good urine output, could consider repeat dose of IV lasix  today. GDMT severely limited by CKD V Continue afterload reduction with Hydralazine  100mg  TID, Imdur  120mg   Per primary team: DM type II Emphysema CVA For questions or updates, please contact [facility] Please consult [email or web address] for contact info under  Signed, Leala Prince, PA-C   Patient seen and examined, note reviewed with the signed Advanced Practice Provider. I personally reviewed laboratory data, imaging studies and relevant notes. I independently examined the patient and formulated the important aspects of the plan. I have personally discussed the plan with the patient and/or family. Comments or changes to the note/plan are indicated below.  Acute on chronic diastolic heart failure Elevated troponin suspect type II mismatch demand ischemia. Coronary artery calcification on CT scan Accelerated/Resistant hypertension Prolonged QT Acute on chronic CKD stage V Type 2 diabetes   Her systolic blood pressure shows  some improvement with dropping her diastolic blood pressure and now clinically she reports some dizziness earlier.  I discussed with the patient for now we will not adjust antihypertensive medications we will keep her on the Imdur  120, hydralazine  100 3 times daily as well as the carvedilol  6.125 mg.  If her diastolic continues to be lowered. If her diastolic blood pressure does not improve I will go back to the Imdur   to 60 and consider clonidine  0.1 mg at night.   Cr elevated but stable.     Adriana Lina DO, MS [facility] Attending Cardiologist [facility]  [address] [place], [place] [address] [phone] Website: [email or web address]

## 2024-01-09 NOTE — Inpatient Diabetes Management (Signed)
 Inpatient Diabetes Program Recommendations  AACE/ADA: New Consensus Statement on Inpatient Glycemic Control (2015)  Target Ranges:  Prepandial:   less than 140 mg/dL      Peak postprandial:   less than 180 mg/dL (1-2 hours)      Critically ill patients:  140 - 180 mg/dL   Lab Results  Component Value Date   GLUCAP 244 (H) 01/09/2024   HGBA1C 7.3 (A) 10/31/2023    Latest Reference Range & Units 01/08/24 08:46 01/08/24 11:39 01/08/24 16:01 01/08/24 20:58 01/09/24 07:35  Glucose-Capillary 70 - 99 mg/dL 161 (H) 096 (H) 045 (H) 408 (H) 244 (H)  (H): Data is abnormally high  Review of Glycemic Control  Diabetes history: DM2 Outpatient Diabetes medications: Tresiba  40 units daily, Novolog  8-14 units TID, Mounjaro 2.5 mg Qweek Current orders for Inpatient glycemic control: Novolog  0-15 units TID with meals  Inpatient Diabetes Program Recommendations:   Please consider: -Add Semglee  20 units daily -Add Novolog  3 units tid meal coverage if eats 50% -Decrease Novolog  correction to 0-6 units tid, 0-5 units hs  Thank you, Lauris Serviss E. Mahmood Boehringer, RN, MSN, CDCES  Diabetes Coordinator Inpatient Glycemic Control Team Team Pager [phone] (8am-5pm) 01/09/2024 10:23 AM

## 2024-01-10 ENCOUNTER — Other Ambulatory Visit (HOSPITAL_COMMUNITY): Payer: Self-pay

## 2024-01-10 ENCOUNTER — Other Ambulatory Visit: Payer: Self-pay | Admitting: Internal Medicine

## 2024-01-10 DIAGNOSIS — E1122 Type 2 diabetes mellitus with diabetic chronic kidney disease: Secondary | ICD-10-CM

## 2024-01-10 DIAGNOSIS — N185 Chronic kidney disease, stage 5: Secondary | ICD-10-CM | POA: Diagnosis not present

## 2024-01-10 DIAGNOSIS — I5033 Acute on chronic diastolic (congestive) heart failure: Secondary | ICD-10-CM | POA: Diagnosis not present

## 2024-01-10 DIAGNOSIS — I10 Essential (primary) hypertension: Secondary | ICD-10-CM | POA: Diagnosis not present

## 2024-01-10 LAB — RENAL FUNCTION PANEL
Albumin: 2.2 g/dL — ABNORMAL LOW (ref 3.5–5.0)
Anion gap: 11 (ref 5–15)
BUN: 55 mg/dL — ABNORMAL HIGH (ref 8–23)
CO2: 23 mmol/L (ref 22–32)
Calcium: 8.3 mg/dL — ABNORMAL LOW (ref 8.9–10.3)
Chloride: 102 mmol/L (ref 98–111)
Creatinine, Ser: 3.46 mg/dL — ABNORMAL HIGH (ref 0.44–1.00)
GFR, Estimated: 13 mL/min — ABNORMAL LOW (ref >60)
Glucose, Bld: 310 mg/dL — ABNORMAL HIGH (ref 70–99)
Phosphorus: 4.5 mg/dL (ref 2.5–4.6)
Potassium: 4.2 mmol/L (ref 3.5–5.1)
Sodium: 136 mmol/L (ref 135–145)

## 2024-01-10 LAB — GLUCOSE, CAPILLARY
Glucose-Capillary: 305 mg/dL — ABNORMAL HIGH (ref 70–99)
Glucose-Capillary: 315 mg/dL — ABNORMAL HIGH (ref 70–99)

## 2024-01-10 MED ORDER — CLONIDINE HCL 0.1 MG PO TABS
0.1000 mg | ORAL_TABLET | Freq: Every day | ORAL | 11 refills | Status: DC
  Filled 2024-01-10: qty 60, 60d supply, fill #0

## 2024-01-10 MED ORDER — AMLODIPINE BESYLATE 10 MG PO TABS
10.0000 mg | ORAL_TABLET | Freq: Every day | ORAL | 0 refills | Status: DC
  Filled 2024-01-10: qty 30, 30d supply, fill #0

## 2024-01-10 MED ORDER — ROSUVASTATIN CALCIUM 20 MG PO TABS
20.0000 mg | ORAL_TABLET | Freq: Every day | ORAL | 0 refills | Status: DC
  Filled 2024-01-10: qty 90, 90d supply, fill #0

## 2024-01-10 MED ORDER — ISOSORBIDE MONONITRATE ER 60 MG PO TB24
60.0000 mg | ORAL_TABLET | Freq: Every day | ORAL | 0 refills | Status: AC
  Filled 2024-01-10: qty 30, 30d supply, fill #0

## 2024-01-10 MED ORDER — HYDROXYZINE HCL 25 MG PO TABS
25.0000 mg | ORAL_TABLET | Freq: Three times a day (TID) | ORAL | 0 refills | Status: DC | PRN
  Filled 2024-01-10: qty 30, 10d supply, fill #0

## 2024-01-10 MED ORDER — ASPIRIN 81 MG PO TBEC
81.0000 mg | DELAYED_RELEASE_TABLET | Freq: Every day | ORAL | 12 refills | Status: AC
  Filled 2024-01-10: qty 30, 30d supply, fill #0

## 2024-01-10 MED ORDER — CLONIDINE HCL 0.1 MG PO TABS: 0.1000 mg | ORAL_TABLET | Freq: Every day | ORAL | Status: DC

## 2024-01-10 MED ORDER — CARVEDILOL 6.25 MG PO TABS
6.2500 mg | ORAL_TABLET | Freq: Two times a day (BID) | ORAL | 0 refills | Status: DC
  Filled 2024-01-10: qty 60, 30d supply, fill #0

## 2024-01-10 MED ORDER — ISOSORBIDE MONONITRATE ER 60 MG PO TB24: 60.0000 mg | ORAL_TABLET | Freq: Every day | ORAL | Status: DC

## 2024-01-10 MED ORDER — HYDRALAZINE HCL 100 MG PO TABS
100.0000 mg | ORAL_TABLET | Freq: Three times a day (TID) | ORAL | 0 refills | Status: DC
  Filled 2024-01-10: qty 90, 30d supply, fill #0

## 2024-01-10 MED ORDER — TRESIBA FLEXTOUCH 200 UNIT/ML ~~LOC~~ SOPN
40.0000 [IU] | PEN_INJECTOR | Freq: Every day | SUBCUTANEOUS | 3 refills | Status: AC
  Filled 2024-01-10: qty 6, 30d supply, fill #0

## 2024-01-10 NOTE — Progress Notes (Signed)
 Mobility Specialist Progress Note;   01/10/24 0957  Mobility  Activity Ambulated with assistance in hallway  Level of Assistance Standby assist, set-up cues, supervision of patient - no hands on  Assistive Device Four wheel walker  Distance Ambulated (ft) 400 ft  Activity Response Tolerated well  Mobility Referral Yes  Mobility visit 1 Mobility  Mobility Specialist Start Time (ACUTE ONLY) 0957  Mobility Specialist Stop Time (ACUTE ONLY) 1014  Mobility Specialist Time Calculation (min) (ACUTE ONLY) 17 min   Pt agreeable to mobility. Required no physical assistance during ambulation, SV. VSS throughout and no c/o when asked. Pt seemed a bit fatigue once returned back to bed. Pt left sitting on EOB with all needs met.   Janit Meline Mobility Specialist Please contact via SecureChat or [employer] [phone]

## 2024-01-10 NOTE — Progress Notes (Addendum)
 Patient Name: [name] Date of Encounter: 01/10/2024 [facility] Cardiologist: Eilleen Grates, MD  Interval Summary  .    Patient continues with dizziness upon standing/moving and does still feel short of breath with exertion. She is very hopeful for the possibility of discharge today.   Vital Signs .    Vitals:   01/09/24 2057 01/10/24 0504 01/10/24 0550 01/10/24 0829  BP:  (!) 182/49 (!) 159/47 (!) 160/39  Pulse:  65  64  Resp:  18  20  Temp:  97.9 F (36.6 C)  98.5 F (36.9 C)  TempSrc:  Oral  Oral  SpO2: 98% 94%    Weight:  80.5 kg    Height:        Intake/Output Summary (Last 24 hours) at 01/10/2024 0852 Last data filed at 01/09/2024 2150 Gross per 24 hour  Intake 240 ml  Output 1975 ml  Net -1735 ml      01/10/2024    5:04 AM 01/09/2024    3:43 AM 01/08/2024    4:21 AM  Last 3 Weights  Weight (lbs) 177 lb 6.4 oz 189 lb 6.4 oz 189 lb 1.6 oz  Weight (kg) 80.468 kg 85.911 kg 85.775 kg      Telemetry/ECG    Sinus rhythm - Personally Reviewed  Physical Exam .   GEN: No acute distress.   Neck: No JVD Cardiac: RRR, no murmurs, rubs, or gallops.  Respiratory: Clear to auscultation bilaterally. GI: Soft, nontender, non-distended  MS: No edema  Assessment & Plan .     Patient admitted with hypervolemia/hypertension in setting of CKD V   Resistant Hypertension Systolic BP remains elevated. Options remain very limited by CKD and HR. Patient continues with low diastolic BP that is likely causing some of her dizziness. Will likely need to compromise with BP management to ensure that diastolic BP is not too low. Continue Coreg  6.25mg  BID Continue Imdur  120mg  daily, Hydralazine  100mg  TID, Amlodipine  10mg  daily. Would consider reducing Imdur  dose at discharge due to low diastolic pressures.    Coronary atherosclerosis Elevated troponin hsTn 55>70>127. Prior CT chest from October 2024 with coronary atherosclerosis. Prior stress testing in 2020  negative/low risk.  Continue ASA and Crestor  with no plans for ischemic evaluation due to lack of symptoms and patient desire to avoid dialysis.    Acute on chronic diastolic CHF Patient presented with volume overload, worsening baseline CKD V. BNP 518. Patient initially diuresed with IV lasix , subsequently discontinued with rising creatinine. Echo with LVEF 60-65%, mild LVH, preserved RV function. Patient switched to PO lasix  yesterday, received 80mg  with good urine output.  Generally euvolemic appearing with minimal edema in lower extremities. Overall stable renal function with creatinine 3.39->3.46. GDMT severely limited by CKD V Continue afterload reduction with Hydralazine  100mg  TID, Imdur  120mg    Per primary team: DM type II Emphysema CVA For questions or updates, please contact [facility] Please consult [email or web address] for contact info under  Signed, Leala Prince, PA-C   Patient seen and examined, note reviewed with the signed Advanced Practice Provider. I personally reviewed laboratory data, imaging studies and relevant notes. I independently examined the patient and formulated the important aspects of the plan. I have personally discussed the plan with the patient and/or family. Comments or changes to the note/plan are indicated below.   As noted yesterday since starting/increasing the Imdur  she has had wide pulse pressure with lowering diastolic blood pressure.  I am going to go back to Imdur   60 mg daily.  Add clonidine  0.1 mg at bedtime.  Xiadani Damman DO, MS [facility] Attending Cardiologist [facility]  [address] [place], [place] [address] [phone] Website: [email or web address]

## 2024-01-10 NOTE — Progress Notes (Signed)
 Nursing Discharge Note   Name: [name] MRN: [id number] DOB: August 17, 1946    Admit Date:  01/03/2024  Discharge Date:  01/10/2024   [name] is to be discharged home per MD order.  AVS completed. Reviewed with patient and family at bedside by Jud Norris, who is discharging patient.  Highlighted copy provided for patient to take home.  Patient able to verbalize understanding of discharge instructions. PIV removed. Patient stable upon discharge.   Discharge Instructions   None     Discharge Instructions     (HEART FAILURE PATIENTS) Call MD:  Anytime you have any of the following symptoms: 1) 3 pound weight gain in 24 hours or 5 pounds in 1 week 2) shortness of breath, with or without a dry hacking cough 3) swelling in the hands, feet or stomach 4) if you have to sleep on extra pillows at night in order to breathe.   Complete by: As directed    Ambulatory referral to Cardiology   Complete by: As directed    Ambulatory referral to Nephrology   Complete by: As directed    Call MD for:  difficulty breathing, headache or visual disturbances   Complete by: As directed    Call MD for:  persistant dizziness or light-headedness   Complete by: As directed    Diet - low sodium heart healthy   Complete by: As directed    Increase activity slowly   Complete by: As directed         Allergies as of 01/10/2024       Reactions   Ciprofloxacin  Swelling   Ozempic  (0.25 Or 0.5 Mg-dose) [semaglutide (0.25 Or 0.5mg -dos)] Nausea And Vomiting   Plavix [clopidogrel Bisulfate] Palpitations   Amoxicillin Itching, Swelling   FACE & EYES SWELL   Ace Inhibitors Cough   Sore throat   Atorvastatin     myalgias   Lyrica [pregabalin] Itching, Nausea Only    gain weight   Penicillins Other (See Comments)   Pt unsure if there is an allergic reaction         Medication List     STOP taking these medications    metolazone 5 MG tablet Commonly known as: ZAROXOLYN   tirzepatide 2.5  MG/0.5ML Pen Commonly known as: MOUNJARO       TAKE these medications    acetaminophen  500 MG tablet Commonly known as: TYLENOL  Take 1,000 mg by mouth 2 (two) times daily as needed for mild pain (pain score 1-3).   amLODipine  10 MG tablet Commonly known as: NORVASC  Take 1 tablet (10 mg total) by mouth daily. What changed: See the new instructions.   aspirin  EC 81 MG tablet Take 1 tablet (81 mg total) by mouth daily. Swallow whole. Start taking on: January 11, 2024 What changed:  medication strength how much to take when to take this additional instructions   busPIRone  5 MG tablet Commonly known as: BUSPAR  TAKE 1 TABLET BY MOUTH THREE TIMES A DAY What changed: when to take this   calcitRIOL  0.25 MCG capsule Commonly known as: ROCALTROL  Take 0.25 mcg by mouth daily.   carvedilol  6.25 MG tablet Commonly known as: COREG  Take 1 tablet (6.25 mg total) by mouth 2 (two) times daily with a meal.   cloNIDine  0.1 MG tablet Commonly known as: CATAPRES  Take 1 tablet (0.1 mg total) by mouth at bedtime.   Fish Oil 1000 MG Caps Take 1,000 mg by mouth in the morning and at bedtime.   furosemide  40  MG tablet Commonly known as: LASIX  Take 80 mg by mouth 2 (two) times daily.   hydrALAZINE  100 MG tablet Commonly known as: APRESOLINE  Take 1 tablet (100 mg total) by mouth every 8 (eight) hours. What changed:  medication strength how much to take when to take this   hydrOXYzine  25 MG tablet Commonly known as: ATARAX  Take 1 tablet (25 mg total) by mouth 3 (three) times daily as needed for anxiety.   Insulin  Degludec FlexTouch 200 UNIT/ML Sopn Inject 40 Units into the skin daily.   isosorbide  mononitrate 60 MG 24 hr tablet Commonly known as: IMDUR  Take 1 tablet (60 mg total) by mouth daily. Start taking on: January 11, 2024   meclizine  25 MG tablet Commonly known as: ANTIVERT  1/2 tab up to 3 times daily for motion sickness/dizziness. What changed:  how much to take how to  take this when to take this reasons to take this additional instructions   NovoLOG  FlexPen 100 UNIT/ML FlexPen Generic drug: insulin  aspart Inject 10-14 Units into the skin 3 (three) times daily before meals. What changed:  how much to take when to take this reasons to take this   oxyCODONE -acetaminophen  5-325 MG tablet Commonly known as: PERCOCET/ROXICET Take 1 tablet by mouth every 6 (six) hours as needed.   rOPINIRole  0.25 MG tablet Commonly known as: REQUIP  Take 0.25 mg by mouth at bedtime.   rosuvastatin  20 MG tablet Commonly known as: CRESTOR  Take 1 tablet (20 mg total) by mouth daily.   Trelegy Ellipta  200-62.5-25 MCG/ACT Aepb Generic drug: Fluticasone -Umeclidin-Vilant Inhale 1 puff into the lungs daily.         Discharge Instructions/ Education: An After Visit Summary was printed and given to the patient. Discharge instructions given to patient/family with verbalized understanding. Patient instructed to return to Emergency Department, call 911, or call MD for any changes in condition.   Patient escorted via wheelchair to lobby and discharged home via private automobile.

## 2024-01-10 NOTE — TOC Initial Note (Signed)
 Transition of Care [facility]) - Initial/Assessment Note    Patient Details  Name: [name] MRN: [id number] Date of Birth: 1946-01-23  Transition of Care [facility]) CM/SW Contact:    Cosimo Diones, RN Phone Number: 01/10/2024, 2:37 PM  Clinical Narrative:  Patient presented for shortness of breath. PTA patient was from home with spouse. Plan for transition home today; Case Manager discussed home health services with the patient and she is agreeable to [facility] RN for v/s check and disease management. Patient does not have an agency preference; referral submitted to [facility]. Start of care to begin within 24-48 hours post transition home.                Expected Discharge Plan: Home w Home Health Services Barriers to Discharge: Barriers Resolved   Patient Goals and CMS Choice Patient states their goals for this hospitalization and ongoing recovery are:: plan to return home with home health   Choice offered to / list presented to : Patient (Patient did not have a preferenace for agency.)     Expected Discharge Plan and Services   Discharge Planning Services: CM Consult Post Acute Care Choice: Home Health Living arrangements for the past 2 months: Single Family Home Expected Discharge Date: 01/10/24                   HH Arranged: RN, Disease Management HH Agency: [facility] Date [facility] Agency Contacted: 01/10/24 Time HH Agency Contacted: [id number] Representative spoke with at [facility] Agency: Randel Buss  Prior Living Arrangements/Services Living arrangements for the past 2 months: Single Family Home Lives with:: Spouse Patient language and need for interpreter reviewed:: Yes Do you feel safe going back to the place where you live?: Yes      Need for Family Participation in Patient Care: Yes (Comment) Care giver support system in place?: Yes (comment) Current home services: DME (rolling walker) Criminal Activity/Legal Involvement Pertinent to Current Situation/Hospitalization: No -  Comment as needed  Activities of Daily Living   ADL Screening (condition at time of admission) Independently performs ADLs?: Yes (appropriate for developmental age) Is the patient deaf or have difficulty hearing?: No Does the patient have difficulty seeing, even when wearing glasses/contacts?: No Does the patient have difficulty concentrating, remembering, or making decisions?: No  Permission Sought/Granted Permission sought to share information with : Facility Medical sales representative, Case Estate manager/land agent granted to share information with : Yes, Verbal Permission Granted    Emotional Assessment Appearance:: Appears stated age Attitude/Demeanor/Rapport: Engaged Affect (typically observed): Anxious Orientation: : Oriented to Self, Oriented to Place, Oriented to  Time, Oriented to Situation Alcohol  / Substance Use: Not Applicable Psych Involvement: No (comment)  Admission diagnosis:  Elevated brain natriuretic peptide (BNP) level [R79.89] Fluid overload [E87.70] Heart failure (HCC) [I50.9] Dyspnea, unspecified type [R06.00] Patient Active Problem List   Diagnosis Date Noted   COPD (chronic obstructive pulmonary disease) (HCC) 01/06/2024   CKD (chronic kidney disease) stage 5, GFR less than 15 ml/min (HCC) 01/04/2024   Acute on chronic diastolic CHF (congestive heart failure) (HCC) 01/04/2024   Elevated troponin 01/04/2024   Heart failure (HCC) 01/04/2024   Fluid overload 01/03/2024   Chronic pain of both feet 11/21/2023   Constipation 11/21/2023   Anxiety and depression 09/19/2023   Abdominal pain, chronic, right lower quadrant 09/19/2023   Tobacco use disorder 09/19/2023   Chronic pain of multiple joints 09/19/2023   Vertigo 08/29/2022   Chest pain 08/28/2022   Hypertensive urgency with chest pain  and shortness of breath 08/28/2022   Dizziness 08/28/2022   Blurry vision, right eye 08/28/2022   PVD (peripheral vascular disease) (HCC) 01/10/2022   Atherosclerosis of  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 (HCC) 12/11/2021   Pain in right knee 01/07/2020   Pain in left knee 01/07/2020   Carotid artery stenosis 12/03/2019   Migraine with aura and without status migrainosus, not intractable 11/28/2019   Hyperlipidemia associated with type 2 diabetes mellitus (HCC) 11/10/2019   Coronary artery calcification 08/05/2019   PAOD (peripheral arterial occlusive disease) (HCC) 02/26/2019   SOB (shortness of breath) 11/17/2018   Emphysema of lung (HCC) 10/10/2017   Atherosclerosis of aorta (HCC) 10/10/2017   Lung nodule 10/10/2017   Anxiety 08/22/2017   Arthritis 08/22/2017   Neuropathy due to secondary diabetes (HCC) 08/22/2017   Type 2 diabetes mellitus with hyperlipidemia (HCC) 11/07/2015   Encounter for Medicare annual wellness exam 06/24/2015   Generalized anxiety disorder 03/26/2015   Depression, major, recurrent, in partial remission (HCC) 03/26/2015   History of CVA (cerebrovascular accident) 10/04/2014   Tobacco abuse 10/04/2014   Vitamin D  deficiency 11/02/2013   Medication management 11/02/2013   Peripheral vascular disease due to secondary diabetes mellitus (HCC) 05/08/2012   Atherosclerosis of native arteries of extremity with intermittent claudication 12/24/2011   Diverticulosis of large intestine 06/09/2010   History of colonic polyps 06/09/2010   Esophageal reflux 08/27/2008   Nontoxic multinodular goiter 03/11/2008   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/11/2008   Brain aneurysm 03/11/2008   CKD stage 4 due to type 2 diabetes mellitus (HCC) 05/01/2007   Essential hypertension 05/01/2007   PCP:  Paseda, Folashade R, FNP Pharmacy:   [facility] [phone] [place], [place] - [address] [place] [address] Phone: [phone] Fax: [phone]  [id number] [place], [place] - [address] AT [address] [place] [address] Phone: [phone] Fax:  [phone]  [facility] [address] [place] [address] Phone: [phone] Fax: [phone]     Social Drivers of Health (SDOH) Social History: SDOH Screenings   Food Insecurity: No Food Insecurity (01/04/2024)  Housing: Low Risk  (01/04/2024)  Transportation Needs: No Transportation Needs (01/04/2024)  Utilities: Not At Risk (01/04/2024)  Depression (PHQ2-9): Medium Risk (09/19/2023)  Financial Resource Strain: Low Risk  (09/19/2023)  Physical Activity: Unknown (09/19/2023)  Social Connections: Socially Integrated (01/04/2024)  Stress: Stress Concern Present (09/19/2023)  Tobacco Use: High Risk (01/03/2024)   SDOH Interventions:     Readmission Risk Interventions    11/20/2021    2:37 PM  Readmission Risk Prevention Plan  Transportation Screening Complete  PCP or Specialist Appt within 5-7 Days Complete  Home Care Screening Complete  Medication Review (RN CM) Complete

## 2024-01-10 NOTE — Discharge Summary (Addendum)
 Physician Discharge Summary   Patient: [name] MRN: [id number] DOB: March 16, 1946  Admit date:     01/03/2024  Discharge date: 01/10/24  Discharge Physician: Jodeane Mulligan   PCP: Paseda, Folashade R, FNP   Recommendations at discharge:   At this time patient will be discharged home with home health.  If you experience any symptoms such as fever, vomiting, shortness of breath, chest pain, abdominal pain, or other concerning symptoms, please call your primary care provider or go to the emergency department immediately.  Discharge Diagnoses: Principal Problem:   CKD (chronic kidney disease) stage 5, GFR less than 15 ml/min (HCC) Active Problems:   Acute on chronic diastolic CHF (congestive heart failure) (HCC)   Essential hypertension   Type 2 diabetes mellitus with hyperlipidemia (HCC)   Coronary artery calcification   Emphysema of lung (HCC)   Peripheral vascular disease due to secondary diabetes mellitus (HCC)   History of CVA (cerebrovascular accident)  Resolved Problems:   * No resolved hospital problems. [facility] Course: Mrs. Guedes was admitted to the hospital with the working diagnosis of volume overload in the setting of chronic kidney disease.   78 yo female with the past medical history of stage V CKD, T2DM, CVA, dyslipidemia, hypertension, and peripheral vascular disease who presented with dyspnea. Reported few weeks of worsening lower extremity edema and progressive dyspnea on exertion.  12/21/23 she had right brachiocephalic fistula created in preparation for hemodialysis. Outpatient follow up on the day of admission she was found volume overloaded by her Nephrologist and was referred to the ED for further evaluation.  On her initial physical examination her blood pressure was 178/54, HR 65, RR 20 and 02 saturation 97%. Lungs with rales bilaterally, heart with S1 and S2 present and regular with positive systolic murmur, abdomen with no distention and positive  bilateral lower extremity edema, pitting ++  Na 140, K 3,6 Cl 104 bicarbonate 26 bun 25 cr 2,96  BNP 518  High sensitive troponin 55, 70 and 127  Wbc 10,8 hgb 14.2 plt 247   Chest radiograph with mild cardiomegaly, with mild hilar vascular congestion with no effusions or infiltrates.   EKG 71 bpm, normal axis, normal intervals, qtc 491, sinus rhythm with no significant ST segment changes, negative T wave lead I and aVL (chronic changes).   Patient was placed on furosemide  with improvement in her volume status.   04/18 worsening renal function per serum cr, and furosemide  has been held.  04/19 renal function more stable, blood pressure not yet controlled.  04/20 patient with dyspnea and orthopnea, resumed IV furosemide   04/21 resume oral loop diuretic, possible discharge tomorrow if renal function and blood pressure stable.   Assessment and Plan:  Acute on chronic HFpEF - Echo showing EF 60-65%.  Responded well to initial diuresis and antihypertensive regimen managed closely by cardiology.  Dyspnea/hypoxia resolved.  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  Difficult to manage secondary to wide pulse pressure/low diastolic blood pressure.  Appears to be showing improvement, continue Coreg  6.25 mg twice daily, Imdur  60 mg daily, hydralazine  100 mg 3 times daily, amlodipine  10 mg daily, clonidine  0.1 at bedtime.  Patient to follow closely with cardiology in the outpatient setting.  Elevated troponin - Likely secondary to cardiac demand with CKD 5.  Continue ASA, Crestor  plan no plans for ischemic evaluation.  CKD 5 - Vascular surgery with recent fistula placed in anticipation of needing HD in the outpatient setting.  Responding well to  diuretic therapy currently.  Creatinine appears to be in line.  Will need to follow-up with nephrology in the outpatient setting.  CAD/PVD/history of CVA - Continue home regimen aspirin , statin.  Diabetes mellitus - Resume home  regiment.  ADDENDUM:  Added atarax  25mg  TID prn for anxiety.       Consultants: Cardiology, nephrology, heart failure Procedures performed: None Disposition: Home Diet recommendation:  Discharge Diet Orders (From admission, onward)     Start     Ordered   01/10/24 0000  Diet - low sodium heart healthy        01/10/24 1227           Renal diet DISCHARGE MEDICATION: Allergies as of 01/10/2024       Reactions   Ciprofloxacin  Swelling   Ozempic  (0.25 Or 0.5 Mg-dose) [semaglutide (0.25 Or 0.5mg -dos)] Nausea And Vomiting   Plavix [clopidogrel Bisulfate] Palpitations   Amoxicillin Itching, Swelling   FACE & EYES SWELL   Ace Inhibitors Cough   Sore throat   Atorvastatin     myalgias   Lyrica [pregabalin] Itching, Nausea Only    gain weight   Penicillins Other (See Comments)   Pt unsure if there is an allergic reaction         Medication List     STOP taking these medications    metolazone 5 MG tablet Commonly known as: ZAROXOLYN   tirzepatide 2.5 MG/0.5ML Pen Commonly known as: MOUNJARO       TAKE these medications    acetaminophen  500 MG tablet Commonly known as: TYLENOL  Take 1,000 mg by mouth 2 (two) times daily as needed for mild pain (pain score 1-3).   amLODipine  10 MG tablet Commonly known as: NORVASC  Take 1 tablet (10 mg total) by mouth daily. What changed: See the new instructions.   aspirin  EC 81 MG tablet Take 1 tablet (81 mg total) by mouth daily. Swallow whole. Start taking on: January 11, 2024 What changed:  medication strength how much to take when to take this additional instructions   busPIRone  5 MG tablet Commonly known as: BUSPAR  TAKE 1 TABLET BY MOUTH THREE TIMES A DAY What changed: when to take this   calcitRIOL  0.25 MCG capsule Commonly known as: ROCALTROL  Take 0.25 mcg by mouth daily.   carvedilol  6.25 MG tablet Commonly known as: COREG  Take 1 tablet (6.25 mg total) by mouth 2 (two) times daily with a meal.    cloNIDine  0.1 MG tablet Commonly known as: CATAPRES  Take 1 tablet (0.1 mg total) by mouth at bedtime.   Fish Oil 1000 MG Caps Take 1,000 mg by mouth in the morning and at bedtime.   furosemide  40 MG tablet Commonly known as: LASIX  Take 80 mg by mouth 2 (two) times daily.   hydrALAZINE  100 MG tablet Commonly known as: APRESOLINE  Take 1 tablet (100 mg total) by mouth every 8 (eight) hours. What changed:  medication strength how much to take when to take this   hydrOXYzine  25 MG tablet Commonly known as: ATARAX  Take 1 tablet (25 mg total) by mouth 3 (three) times daily as needed for anxiety.   isosorbide  mononitrate 60 MG 24 hr tablet Commonly known as: IMDUR  Take 1 tablet (60 mg total) by mouth daily. Start taking on: January 11, 2024   meclizine  25 MG tablet Commonly known as: ANTIVERT  1/2 tab up to 3 times daily for motion sickness/dizziness. What changed:  how much to take how to take this when to take this reasons to take this additional  instructions   NovoLOG  FlexPen 100 UNIT/ML FlexPen Generic drug: insulin  aspart Inject 10-14 Units into the skin 3 (three) times daily before meals. What changed:  how much to take when to take this reasons to take this   oxyCODONE -acetaminophen  5-325 MG tablet Commonly known as: PERCOCET/ROXICET Take 1 tablet by mouth every 6 (six) hours as needed.   rOPINIRole  0.25 MG tablet Commonly known as: REQUIP  Take 0.25 mg by mouth at bedtime.   rosuvastatin  20 MG tablet Commonly known as: CRESTOR  Take 1 tablet (20 mg total) by mouth daily.   Trelegy Ellipta  200-62.5-25 MCG/ACT Aepb Generic drug: Fluticasone -Umeclidin-Vilant Inhale 1 puff into the lungs daily.   Tresiba  FlexTouch 200 UNIT/ML FlexTouch Pen Generic drug: insulin  degludec Inject 40 Units into the skin daily.        Discharge Exam: Filed Weights   01/08/24 0421 01/09/24 0343 01/10/24 0504  Weight: 85.8 kg 85.9 kg 80.5 kg   GENERAL:  Alert, pleasant, no  acute distress  HEENT:  EOMI CARDIOVASCULAR:  RRR, no murmurs appreciated RESPIRATORY:  Clear to auscultation, no wheezing, rales, or rhonchi GASTROINTESTINAL:  Soft, nontender, nondistended EXTREMITIES:  No LE edema bilaterally NEURO:  No new focal deficits appreciated SKIN:  No rashes noted PSYCH:  Appropriate mood and affect    Condition at discharge: improving  The results of significant diagnostics from this hospitalization (including imaging, microbiology, ancillary and laboratory) are listed below for reference.   Imaging Studies: DG Chest 1 View Result Date: 01/07/2024 CLINICAL DATA:  Follow-up exam.  Shortness of breath. EXAM: CHEST  1 VIEW COMPARISON:  01/03/2024. FINDINGS: The heart is mildly enlarged and the mediastinal contour is within normal limits. There is atherosclerotic calcification of the aorta. Minimal atelectasis or scarring is present at the left lung base. A calcified granuloma is noted at the right lung base. No effusion or pneumothorax is seen. No acute osseous abnormality. IMPRESSION: Minimal atelectasis or scarring at the left lung base. Electronically Signed   By: Wyvonnia Heimlich M.D.   On: 01/07/2024 16:57   ECHOCARDIOGRAM COMPLETE Result Date: 01/04/2024    ECHOCARDIOGRAM REPORT   Patient Name:   CORRI DELAPAZ Date of Exam: 01/04/2024 Medical Rec #:  [id number]      Height:       66.0 in Accession #:    [id number]     Weight:       197.0 lb Date of Birth:  13-Sep-1946       BSA:          1.987 m Patient Age:    78 years       BP:           205/91 mmHg Patient Gender: F              HR:           66 bpm. Exam Location:  Inpatient Procedure: 2D Echo, Cardiac Doppler and Color Doppler (Both Spectral and Color            Flow Doppler were utilized during procedure). Indications:    CHF  History:        Patient has no prior history of Echocardiogram examinations.                 CHF, PAD and Stroke; Risk Factors:Hypertension and Diabetes.  Sonographer:    Sulema Endo RDCS,  FE, PE Referring Phys: [id number] VASUNDHRA RATHORE IMPRESSIONS  1. Left ventricular ejection fraction, by estimation, is 60 to 65%. The left  ventricle has normal function. The left ventricle has no regional wall motion abnormalities. There is mild concentric left ventricular hypertrophy. Left ventricular diastolic parameters are consistent with Grade I diastolic dysfunction (impaired relaxation).  2. Right ventricular systolic function is normal. The right ventricular size is normal.  3. The mitral valve is normal in structure. Trivial mitral valve regurgitation. No evidence of mitral stenosis.  4. The aortic valve is tricuspid. Aortic valve regurgitation is not visualized. Aortic valve sclerosis is present, with no evidence of aortic valve stenosis.  5. The inferior vena cava is normal in size with greater than 50% respiratory variability, suggesting right atrial pressure of 3 mmHg. FINDINGS  Left Ventricle: Left ventricular ejection fraction, by estimation, is 60 to 65%. The left ventricle has normal function. The left ventricle has no regional wall motion abnormalities. The left ventricular internal cavity size was normal in size. There is  mild concentric left ventricular hypertrophy. Left ventricular diastolic parameters are consistent with Grade I diastolic dysfunction (impaired relaxation). Right Ventricle: The right ventricular size is normal. No increase in right ventricular wall thickness. Right ventricular systolic function is normal. Left Atrium: Left atrial size was normal in size. Right Atrium: Right atrial size was normal in size. Pericardium: There is no evidence of pericardial effusion. Mitral Valve: The mitral valve is normal in structure. Mild mitral annular calcification. Trivial mitral valve regurgitation. No evidence of mitral valve stenosis. Tricuspid Valve: The tricuspid valve is normal in structure. Tricuspid valve regurgitation is trivial. No evidence of tricuspid stenosis. Aortic Valve: The  aortic valve is tricuspid. Aortic valve regurgitation is not visualized. Aortic valve sclerosis is present, with no evidence of aortic valve stenosis. Aortic valve mean gradient measures 3.0 mmHg. Aortic valve peak gradient measures 6.1  mmHg. Aortic valve area, by VTI measures 3.62 cm. Pulmonic Valve: The pulmonic valve was normal in structure. Pulmonic valve regurgitation is not visualized. No evidence of pulmonic stenosis. Aorta: The aortic root is normal in size and structure. Venous: The inferior vena cava is normal in size with greater than 50% respiratory variability, suggesting right atrial pressure of 3 mmHg. IAS/Shunts: No atrial level shunt detected by color flow Doppler.  LEFT VENTRICLE PLAX 2D LVIDd:         4.60 cm   Diastology LVIDs:         3.10 cm   LV e' medial:    6.22 cm/s LV PW:         1.20 cm   LV E/e' medial:  12.5 LV IVS:        1.20 cm   LV e' lateral:   7.93 cm/s LVOT diam:     2.30 cm   LV E/e' lateral: 9.8 LV SV:         109 LV SV Index:   55 LVOT Area:     4.15 cm  RIGHT VENTRICLE RV S prime:     12.40 cm/s TAPSE (M-mode): 1.6 cm LEFT ATRIUM             Index        RIGHT ATRIUM           Index LA diam:        4.00 cm 2.01 cm/m   RA Area:     13.40 cm LA Vol (A2C):   53.2 ml 26.77 ml/m  RA Volume:   33.70 ml  16.96 ml/m LA Vol (A4C):   39.4 ml 19.83 ml/m LA Biplane Vol: 48.6 ml 24.46 ml/m  AORTIC VALVE AV Area (Vmax):    3.82 cm AV Area (Vmean):   3.72 cm AV Area (VTI):     3.62 cm AV Vmax:           123.00 cm/s AV Vmean:          82.500 cm/s AV VTI:            0.301 m AV Peak Grad:      6.1 mmHg AV Mean Grad:      3.0 mmHg LVOT Vmax:         113.00 cm/s LVOT Vmean:        73.900 cm/s LVOT VTI:          0.262 m LVOT/AV VTI ratio: 0.87  AORTA Ao Root diam: 3.00 cm Ao Asc diam:  2.80 cm MITRAL VALVE                TRICUSPID VALVE MV Area (PHT): 2.87 cm     TR Peak grad:   5.1 mmHg MV Decel Time: 264 msec     TR Vmax:        113.00 cm/s MV E velocity: 77.80 cm/s MV A velocity:  126.00 cm/s  SHUNTS MV E/A ratio:  0.62         Systemic VTI:  0.26 m                             Systemic Diam: 2.30 cm Arta Lark Electronically signed by Arta Lark Signature Date/Time: 01/04/2024/4:38:54 PM    Final    DG Chest 2 View Result Date: 01/03/2024 CLINICAL DATA:  Chest pain and shortness of breath EXAM: CHEST - 2 VIEW COMPARISON:  Chest x-ray 08/27/2022.  CT of the chest 07/21/2023. FINDINGS: There is a calcified granuloma in the right lower lobe, unchanged. The lungs are otherwise clear. The heart is mildly enlarged. There is no pneumothorax. There is no pleural effusion. No acute fractures are seen. IMPRESSION: 1. No active cardiopulmonary disease. 2. Mild cardiomegaly. Electronically Signed   By: Tyron Gallon M.D.   On: 01/03/2024 15:53   VAS US  UPPER EXT VEIN MAPPING (PRE-OP  AVF) Result Date: 12/12/2023 UPPER EXTREMITY VEIN MAPPING Patient Name:  KIARI HOSMER  Date of Exam:   12/12/2023 Medical Rec #: [id number]       Accession #:    [id number] Date of Birth: 1945-10-21        Patient Gender: F Patient Age:   67 years Exam Location:  [facility] Procedure:      VAS US  UPPER EXT VEIN MAPPING (PRE-OP  AVF) Referring Phys: Genny Kid --------------------------------------------------------------------------------  Indications: Pre-access. Performing Technologist: Helon Lobos RVT  Examination Guidelines: A complete evaluation includes B-mode imaging, spectral Doppler, color Doppler, and power Doppler as needed of all accessible portions of each vessel. Bilateral testing is considered an integral part of a complete examination. Limited examinations for reoccurring indications may be performed as noted. +-----------------+-------------+----------+--------------+ Right Cephalic   Diameter (cm)Depth (cm)   Findings    +-----------------+-------------+----------+--------------+ Shoulder             0.30                               +-----------------+-------------+----------+--------------+ Prox upper arm       0.29                              +-----------------+-------------+----------+--------------+  Mid upper arm        0.34                              +-----------------+-------------+----------+--------------+ Dist upper arm       0.34                              +-----------------+-------------+----------+--------------+ Antecubital fossa    0.50                              +-----------------+-------------+----------+--------------+ Prox forearm         0.14                              +-----------------+-------------+----------+--------------+ Mid forearm                             not visualized +-----------------+-------------+----------+--------------+ +-----------------+-------------+----------+--------+ Right Basilic    Diameter (cm)Depth (cm)Findings +-----------------+-------------+----------+--------+ Mid upper arm        0.49                        +-----------------+-------------+----------+--------+ Dist upper arm       0.33                        +-----------------+-------------+----------+--------+ Antecubital fossa    0.34                        +-----------------+-------------+----------+--------+ +-----------------+-------------+----------+--------------+ Left Cephalic    Diameter (cm)Depth (cm)   Findings    +-----------------+-------------+----------+--------------+ Shoulder             0.21                              +-----------------+-------------+----------+--------------+ Prox upper arm       0.19                              +-----------------+-------------+----------+--------------+ Mid upper arm        0.21                              +-----------------+-------------+----------+--------------+ Dist upper arm       0.16                              +-----------------+-------------+----------+--------------+ Antecubital fossa     0.31                              +-----------------+-------------+----------+--------------+ Prox forearm         0.08                              +-----------------+-------------+----------+--------------+ Mid forearm                             not visualized +-----------------+-------------+----------+--------------+ Dist forearm  not visualized +-----------------+-------------+----------+--------------+ +--------------+-------------+----------+--------+ Left Basilic  Diameter (cm)Depth (cm)Findings +--------------+-------------+----------+--------+ Mid upper arm     0.32                        +--------------+-------------+----------+--------+ Dist upper arm    0.21                        +--------------+-------------+----------+--------+ Summary: Right: Patent cephalic and basilic veins. Left: Patent cephalic and basilic veins. *See table(s) above for measurements and observations.  Diagnosing physician: Genny Kid MD Electronically signed by Genny Kid MD on 12/12/2023 at 4:06:08 PM.    Final    VAS US  UPPER EXTREMITY ARTERIAL DUPLEX Result Date: 12/12/2023  UPPER EXTREMITY DUPLEX STUDY Patient Name:  MIAMI LATULIPPE  Date of Exam:   12/12/2023 Medical Rec #: [id number]       Accession #:    [id number] Date of Birth: Sep 08, 1946        Patient Gender: F Patient Age:   63 years Exam Location:  [facility] Procedure:      VAS US  UPPER EXTREMITY ARTERIAL DUPLEX Referring Phys: Genny Kid --------------------------------------------------------------------------------  Indications: Pre op HD access.  Performing Technologist: Helon Lobos RVT  Examination Guidelines: A complete evaluation includes B-mode imaging, spectral Doppler, color Doppler, and power Doppler as needed of all accessible portions of each vessel. Bilateral testing is considered an integral part of a complete examination. Limited examinations for reoccurring  indications may be performed as noted.  Right Pre-Dialysis Findings: +-----------------------+----------+--------------------+---------+--------+ Location               PSV (cm/s)Intralum. Diam. (cm)Waveform Comments +-----------------------+----------+--------------------+---------+--------+ Brachial Antecub. fossa98        0.51                triphasic         +-----------------------+----------+--------------------+---------+--------+ Radial Art at Wrist    79        0.20                triphasic         +-----------------------+----------+--------------------+---------+--------+ Ulnar Art at Wrist     62        0.14                triphasic         +-----------------------+----------+--------------------+---------+--------+  Left Pre-Dialysis Findings: +-----------------------+----------+--------------------+---------+--------+ Location               PSV (cm/s)Intralum. Diam. (cm)Waveform Comments +-----------------------+----------+--------------------+---------+--------+ Brachial Antecub. fossa91        0.40                triphasic         +-----------------------+----------+--------------------+---------+--------+ Radial Art at Wrist    70        0.16                biphasic          +-----------------------+----------+--------------------+---------+--------+ Ulnar Art at Wrist     53        0.13                biphasic          +-----------------------+----------+--------------------+---------+--------+  Summary:  Right: Patent brachial, radial, and ulnar arteries. Left: Patent brachial, radial, and ulnar arteries. The radial and       ulnar artery waveforms are biphasic. *See table(s) above for measurements and observations. Electronically signed by Genny Kid MD  on 12/12/2023 at 4:04:12 PM.    Final     Microbiology: Results for orders placed or performed in visit on 08/08/23  MICROSCOPIC MESSAGE     Status: None   Collection Time: 08/08/23  3:06 PM   Result Value Ref Range Status   Note   Final    Comment: This urine was analyzed for the presence of WBC,  RBC, bacteria, casts, and other formed elements.  Only those elements seen were reported. . .     Labs: CBC: Recent Labs  Lab 01/03/24 1300  WBC 10.8*  HGB 14.2  HCT 43.8  MCV 99.8  PLT 247   Basic Metabolic Panel: Recent Labs  Lab 01/04/24 0308 01/05/24 0417 01/06/24 0412 01/07/24 0504 01/08/24 0522 01/09/24 0712 01/10/24 0658  NA 139 141 140 137 136 136 136  K 4.1 3.5 3.8 4.4 4.7 4.4 4.2  CL 103 106 101 101 106 103 102  CO2 24 28 27 26  20* 24 23  GLUCOSE 187* 94 194* 192* 193* 258* 310*  BUN 26* 29* 36* 38* 47* 53* 55*  CREATININE 2.93* 2.98* 3.40* 3.19* 3.42* 3.39* 3.46*  CALCIUM  8.2* 8.6* 8.3* 8.2* 8.3* 8.8* 8.3*  MG 2.0 1.9  --   --   --   --   --   PHOS  --   --  3.8 3.6 4.1 4.1 4.5   Liver Function Tests: Recent Labs  Lab 01/03/24 1300 01/06/24 0412 01/07/24 0504 01/08/24 0522 01/09/24 0712 01/10/24 0658  AST 29  --   --   --   --   --   ALT 18  --   --   --   --   --   ALKPHOS 71  --   --   --   --   --   BILITOT 0.5  --   --   --   --   --   PROT 5.8*  --   --   --   --   --   ALBUMIN  2.4* 2.3* 2.3* 2.2* 2.3* 2.2*   CBG: Recent Labs  Lab 01/09/24 1253 01/09/24 1644 01/09/24 2132 01/10/24 0806 01/10/24 1211  GLUCAP 293* 218* 286* 305* 315*    Discharge time spent: greater than 30 minutes.  Signed: Jodeane Mulligan, DO [facility] 01/10/2024

## 2024-01-11 ENCOUNTER — Telehealth: Payer: Self-pay

## 2024-01-11 DIAGNOSIS — I2510 Atherosclerotic heart disease of native coronary artery without angina pectoris: Secondary | ICD-10-CM | POA: Diagnosis not present

## 2024-01-11 DIAGNOSIS — E1122 Type 2 diabetes mellitus with diabetic chronic kidney disease: Secondary | ICD-10-CM | POA: Diagnosis not present

## 2024-01-11 DIAGNOSIS — I132 Hypertensive heart and chronic kidney disease with heart failure and with stage 5 chronic kidney disease, or end stage renal disease: Secondary | ICD-10-CM | POA: Diagnosis not present

## 2024-01-11 DIAGNOSIS — I083 Combined rheumatic disorders of mitral, aortic and tricuspid valves: Secondary | ICD-10-CM | POA: Diagnosis not present

## 2024-01-11 DIAGNOSIS — E785 Hyperlipidemia, unspecified: Secondary | ICD-10-CM | POA: Diagnosis not present

## 2024-01-11 DIAGNOSIS — Z7951 Long term (current) use of inhaled steroids: Secondary | ICD-10-CM | POA: Diagnosis not present

## 2024-01-11 DIAGNOSIS — I5033 Acute on chronic diastolic (congestive) heart failure: Secondary | ICD-10-CM | POA: Diagnosis not present

## 2024-01-11 DIAGNOSIS — Z794 Long term (current) use of insulin: Secondary | ICD-10-CM | POA: Diagnosis not present

## 2024-01-11 DIAGNOSIS — E1151 Type 2 diabetes mellitus with diabetic peripheral angiopathy without gangrene: Secondary | ICD-10-CM | POA: Diagnosis not present

## 2024-01-11 DIAGNOSIS — Z8673 Personal history of transient ischemic attack (TIA), and cerebral infarction without residual deficits: Secondary | ICD-10-CM | POA: Diagnosis not present

## 2024-01-11 DIAGNOSIS — J439 Emphysema, unspecified: Secondary | ICD-10-CM | POA: Diagnosis not present

## 2024-01-11 DIAGNOSIS — E1169 Type 2 diabetes mellitus with other specified complication: Secondary | ICD-10-CM | POA: Diagnosis not present

## 2024-01-11 DIAGNOSIS — N185 Chronic kidney disease, stage 5: Secondary | ICD-10-CM | POA: Diagnosis not present

## 2024-01-11 DIAGNOSIS — Z7982 Long term (current) use of aspirin: Secondary | ICD-10-CM | POA: Diagnosis not present

## 2024-01-11 DIAGNOSIS — Z9181 History of falling: Secondary | ICD-10-CM | POA: Diagnosis not present

## 2024-01-11 DIAGNOSIS — I1A0 Resistant hypertension: Secondary | ICD-10-CM | POA: Diagnosis not present

## 2024-01-11 NOTE — Transitions of Care (Post Inpatient/ED Visit) (Signed)
 01/11/2024  Name: [name] MRN: [id number] DOB: July 10, 1946  Today's TOC FU Call Status: Today's TOC FU Call Status:: Successful TOC FU Call Completed Patient's Name and Date of Birth confirmed.  Transition Care Management Follow-up Telephone Call Date of Discharge: 01/03/24 Discharge Facility: [facility]) Type of Discharge: Inpatient Admission How have you been since you were released from the hospital?: Same Any questions or concerns?: Yes Patient Questions/Concerns:: Not sure about all of these new medicines and waiting on [facility] nurse to come this afternoon to help me. Patient Questions/Concerns Addressed: Other: (Medications reviewed but wants HH nurse to reassure with eyes on)  Items Reviewed: Did you receive and understand the discharge instructions provided?: Yes Medications obtained,verified, and reconciled?: Yes (Medications Reviewed) Any new allergies since your discharge?: No Dietary orders reviewed?: Yes Type of Diet Ordered:: Heart Healthy Do you have support at home?: Yes People in Home [RPT]: spouse Name of Support/Comfort Primary Source: Currie Douse  Medications Reviewed Today: Medications Reviewed Today     Reviewed by Jamie Mccoy, RN (Registered Nurse) on 01/11/24 at 1255  Med List Status: <None>   Medication Order Taking? Sig Documenting Provider Last Dose Status Informant  acetaminophen  (TYLENOL ) 500 MG tablet [id number] Yes Take 1,000 mg by mouth 2 (two) times daily as needed for mild pain (pain score 1-3). [provider] Taking Active Self, Pharmacy Records  amLODipine  (NORVASC ) 10 MG tablet [id number] Yes Take 1 tablet (10 mg total) by mouth daily. Jodeane Mulligan, DO Taking Active   aspirin  EC 81 MG tablet [id number] Yes Take 1 tablet (81 mg total) by mouth daily. Swallow whole. Jodeane Mulligan, DO Taking Active   busPIRone  (BUSPAR ) 5 MG tablet [id number] Yes TAKE 1 TABLET BY MOUTH THREE TIMES A DAY  Patient taking differently: Take 5 mg by  mouth 2 (two) times daily.   Wilkinson, Dana E, FNP Taking Active Self, Pharmacy Records           Med Note Shann Darnel, [place] A   Wed Dec 14, 2023  3:41 PM)    calcitRIOL  (ROCALTROL ) 0.25 MCG capsule [id number] Yes Take 0.25 mcg by mouth daily. [provider] Taking Active Self, Pharmacy Records  carvedilol  (COREG ) 6.25 MG tablet [id number] Yes Take 1 tablet (6.25 mg total) by mouth 2 (two) times daily with a meal. Jodeane Mulligan, DO Taking Active   cloNIDine  (CATAPRES ) 0.1 MG tablet [id number] Yes Take 1 tablet (0.1 mg total) by mouth at bedtime. Jodeane Mulligan, DO Taking Active   Fluticasone -Umeclidin-Vilant (TRELEGY ELLIPTA ) 200-62.5-25 MCG/ACT AEPB [id number] Yes Inhale 1 puff into the lungs daily. Mannam, Praveen, MD Taking Active Self, Pharmacy Records  furosemide  (LASIX ) 40 MG tablet [id number] Yes Take 80 mg by mouth 2 (two) times daily. [provider] Taking Active Self, Pharmacy Records           Med Note (LEE, NICOLE   Wed Jan 04, 2024  1:06 AM) Takes at 0730 and 1430.  hydrALAZINE  (APRESOLINE ) 100 MG tablet [id number] Yes Take 1 tablet (100 mg total) by mouth every 8 (eight) hours. Jodeane Mulligan, DO Taking Active   hydrOXYzine  (ATARAX ) 25 MG tablet [id number] Yes Take 1 tablet (25 mg total) by mouth 3 (three) times daily as needed for anxiety. Jodeane Mulligan, DO Taking Active   insulin  aspart (NOVOLOG  FLEXPEN) 100 UNIT/ML FlexPen [id number] Yes Inject 10-14 Units into the skin 3 (three) times daily before meals.  Patient taking differently: Inject 8-14 Units into the skin 3 (  three) times daily as needed for high blood sugar.   Emilie Harden, MD Taking Active Self, Pharmacy Records           Med Note Shann Darnel, [place] A   Wed Dec 14, 2023  3:36 PM)    insulin  degludec (TRESIBA  FLEXTOUCH) 200 UNIT/ML FlexTouch Pen [id number]  Inject 40 Units into the skin daily. Jodeane Mulligan, DO  Active   isosorbide  mononitrate (IMDUR ) 60 MG 24 hr tablet [id number] Yes Take 1 tablet  (60 mg total) by mouth daily. Jodeane Mulligan, DO Taking Active   meclizine  (ANTIVERT ) 25 MG tablet [id number] Yes 1/2 tab up to 3 times daily for motion sickness/dizziness.  Patient taking differently: Take 25 mg by mouth 3 (three) times daily as needed for dizziness.   [place], NP Taking Active Self, Pharmacy Records  Omega-3 Fatty Acids (FISH OIL) 1000 MG CAPS [id number] Yes Take 1,000 mg by mouth in the morning and at bedtime. Cordie Deters, PA-C Taking Active Self, Pharmacy Records  oxyCODONE -acetaminophen  (PERCOCET/ROXICET) 5-325 MG tablet [id number] No Take 1 tablet by mouth every 6 (six) hours as needed.  Patient not taking: Reported on 01/11/2024   Butch Cashing, PA-C Not Taking Active Self, Pharmacy Records  rOPINIRole  (REQUIP ) 0.25 MG tablet [id number] Yes Take 0.25 mg by mouth at bedtime. [provider] Taking Active   rosuvastatin  (CRESTOR ) 20 MG tablet [id number] Yes Take 1 tablet (20 mg total) by mouth daily. Jodeane Mulligan, DO Taking Active             Home Care and Equipment/Supplies: Were Home Health Services Ordered?: Yes Name of Home Health Agency:: [facility] Has Agency set up a time to come to your home?: Yes First Home Health Visit Date: 01/11/24 Any new equipment or medical supplies ordered?: No  Functional Questionnaire: Do you need assistance with bathing/showering or dressing?: No Do you need assistance with meal preparation?: No Do you need assistance with eating?: No Do you have difficulty maintaining continence: No Do you need assistance with getting out of bed/getting out of a chair/moving?: No Do you have difficulty managing or taking your medications?: No (states "I take my medications fine just need to review with [facility] nurse")  Follow up appointments reviewed: PCP Follow-up appointment confirmed?: Yes Date of PCP follow-up appointment?: 01/23/24 Follow-up Provider: [place], [place] Specialist [facility] Follow-up appointment  confirmed?: Yes Date of Specialist follow-up appointment?: 01/23/24 Follow-Up Specialty Provider:: Vascular MD appointment Do you need transportation to your follow-up appointment?: No Do you understand care options if your condition(s) worsen?: Yes-patient verbalized understanding  SDOH Interventions Today    Flowsheet Row Most Recent Value  SDOH Interventions   Food Insecurity Interventions Intervention Not Indicated  Housing Interventions Intervention Not Indicated  Transportation Interventions Intervention Not Indicated  Utilities Interventions Intervention Not Indicated       Goals Addressed             This Visit's Progress    VBCI Transitions of Care (TOC) Care Plan       Problems:  Recent Hospitalization for treatment of CHF and CKD Stage new regimen Medication management barrier Understanding new medications and arrange in pill box  Goal:  Over the next 30 days, the patient will not experience hospital readmission  Interventions:    Chronic Kidney Disease Interventions: Reviewed medications with patient and discussed importance of compliance    Advised patient, providing education and rationale, to monitor blood pressure daily and record, calling PCP for findings outside established parameters  Reviewed scheduled/upcoming provider appointments including    Last practice recorded BP readings:  BP Readings from Last 3 Encounters:  01/10/24 (!) 161/39  12/21/23 (!) 176/51  12/12/23 132/74   Most recent eGFR/CrCl:  Lab Results  Component Value Date   EGFR 15 (L) 09/19/2023    No components found for: "CRCL"  Patient Self Care Activities:  Attend all scheduled provider appointments Participate in Transition of Care Program/Attend TOC scheduled calls call office if I gain more than 2 pounds in one day or 5 pounds in one week track weight in diary follow rescue plan if symptoms flare-up  Plan:  Next PCP appointment scheduled for: 01/23/24 at 1:00 pm   Telephone follow up appointment with care management team member scheduled for:  01/20/24 11:00 am        Brown Cape, RN, BSN, CCM [place]  [facility], [facility] RN Care Manager Direct Dial: [phone]

## 2024-01-12 DIAGNOSIS — J439 Emphysema, unspecified: Secondary | ICD-10-CM | POA: Diagnosis not present

## 2024-01-12 DIAGNOSIS — D631 Anemia in chronic kidney disease: Secondary | ICD-10-CM | POA: Diagnosis not present

## 2024-01-12 DIAGNOSIS — R252 Cramp and spasm: Secondary | ICD-10-CM | POA: Diagnosis not present

## 2024-01-12 DIAGNOSIS — I1A0 Resistant hypertension: Secondary | ICD-10-CM | POA: Diagnosis not present

## 2024-01-12 DIAGNOSIS — I5033 Acute on chronic diastolic (congestive) heart failure: Secondary | ICD-10-CM | POA: Diagnosis not present

## 2024-01-12 DIAGNOSIS — N185 Chronic kidney disease, stage 5: Secondary | ICD-10-CM | POA: Diagnosis not present

## 2024-01-12 DIAGNOSIS — N184 Chronic kidney disease, stage 4 (severe): Secondary | ICD-10-CM | POA: Diagnosis not present

## 2024-01-12 DIAGNOSIS — I132 Hypertensive heart and chronic kidney disease with heart failure and with stage 5 chronic kidney disease, or end stage renal disease: Secondary | ICD-10-CM | POA: Diagnosis not present

## 2024-01-12 DIAGNOSIS — I5030 Unspecified diastolic (congestive) heart failure: Secondary | ICD-10-CM | POA: Diagnosis not present

## 2024-01-12 DIAGNOSIS — E1122 Type 2 diabetes mellitus with diabetic chronic kidney disease: Secondary | ICD-10-CM | POA: Diagnosis not present

## 2024-01-12 DIAGNOSIS — N2581 Secondary hyperparathyroidism of renal origin: Secondary | ICD-10-CM | POA: Diagnosis not present

## 2024-01-12 DIAGNOSIS — I770 Arteriovenous fistula, acquired: Secondary | ICD-10-CM | POA: Diagnosis not present

## 2024-01-12 DIAGNOSIS — I129 Hypertensive chronic kidney disease with stage 1 through stage 4 chronic kidney disease, or unspecified chronic kidney disease: Secondary | ICD-10-CM | POA: Diagnosis not present

## 2024-01-13 LAB — LAB REPORT - SCANNED: EGFR: 11

## 2024-01-16 ENCOUNTER — Telehealth: Payer: Self-pay

## 2024-01-16 NOTE — Telephone Encounter (Signed)
 Copied from CRM [id number]. Topic: Clinical - Home Health Verbal Orders >> Jan 16, 2024 12:24 PM Phil Braun wrote: Caller/Agency: [facility] Callback Number: [phone] Service Requested: Skilled Nursing Frequency: 1 week 4, every other week until 06/21 after the 1 week 4.  Any new concerns about the patient? No

## 2024-01-17 ENCOUNTER — Telehealth: Payer: Self-pay

## 2024-01-17 NOTE — Telephone Encounter (Unsigned)
 Copied from CRM [id number]. Topic: General - Other >> Jan 17, 2024  9:03 AM Emylou G wrote: Reason for CRM:  [facility] w/Home health [phone] ) Called.. wanted to advise: Carvedilil 6.25 mg interacts with her Clonidine  0.1 mg Carvedilil 6.25 mg interacts w/TRELEGY ELLIPTA  200mcg

## 2024-01-17 NOTE — Telephone Encounter (Signed)
 [name] was advised. Kh

## 2024-01-18 ENCOUNTER — Telehealth: Payer: Self-pay

## 2024-01-18 ENCOUNTER — Other Ambulatory Visit: Payer: Self-pay

## 2024-01-18 DIAGNOSIS — J439 Emphysema, unspecified: Secondary | ICD-10-CM | POA: Diagnosis not present

## 2024-01-18 DIAGNOSIS — N185 Chronic kidney disease, stage 5: Secondary | ICD-10-CM | POA: Diagnosis not present

## 2024-01-18 DIAGNOSIS — I132 Hypertensive heart and chronic kidney disease with heart failure and with stage 5 chronic kidney disease, or end stage renal disease: Secondary | ICD-10-CM | POA: Diagnosis not present

## 2024-01-18 DIAGNOSIS — I1A0 Resistant hypertension: Secondary | ICD-10-CM | POA: Diagnosis not present

## 2024-01-18 DIAGNOSIS — E1122 Type 2 diabetes mellitus with diabetic chronic kidney disease: Secondary | ICD-10-CM | POA: Diagnosis not present

## 2024-01-18 DIAGNOSIS — N186 End stage renal disease: Secondary | ICD-10-CM

## 2024-01-18 DIAGNOSIS — I5033 Acute on chronic diastolic (congestive) heart failure: Secondary | ICD-10-CM | POA: Diagnosis not present

## 2024-01-18 NOTE — Telephone Encounter (Signed)
 Copied from CRM [id number]. Topic: General - Other >> Jan 18, 2024  8:56 AM Emylou G wrote: Reason for CRM: [name] w/[facility] call her [phone] Needs sliding scale parameters for her blood sugar.Aaron Aas

## 2024-01-19 ENCOUNTER — Telehealth: Payer: Self-pay

## 2024-01-19 DIAGNOSIS — N185 Chronic kidney disease, stage 5: Secondary | ICD-10-CM | POA: Diagnosis not present

## 2024-01-19 DIAGNOSIS — I5033 Acute on chronic diastolic (congestive) heart failure: Secondary | ICD-10-CM | POA: Diagnosis not present

## 2024-01-19 DIAGNOSIS — I1A0 Resistant hypertension: Secondary | ICD-10-CM | POA: Diagnosis not present

## 2024-01-19 DIAGNOSIS — E1122 Type 2 diabetes mellitus with diabetic chronic kidney disease: Secondary | ICD-10-CM | POA: Diagnosis not present

## 2024-01-19 DIAGNOSIS — J439 Emphysema, unspecified: Secondary | ICD-10-CM | POA: Diagnosis not present

## 2024-01-19 DIAGNOSIS — I132 Hypertensive heart and chronic kidney disease with heart failure and with stage 5 chronic kidney disease, or end stage renal disease: Secondary | ICD-10-CM | POA: Diagnosis not present

## 2024-01-19 NOTE — Telephone Encounter (Signed)
 Copied from CRM [id number]. Topic: Clinical - Home Health Verbal Orders >> Jan 19, 2024  3:41 PM Stanly Early wrote: Caller/Agency: [facility] asking for upper limited parameter for weight  Callback Number: [phone] Service Requested: Skilled Nursing Frequency: n/a  Any new concerns about the patient? No

## 2024-01-19 NOTE — Telephone Encounter (Signed)
 Please advise on the in between  If pt is to use insulin . [facility]

## 2024-01-20 ENCOUNTER — Other Ambulatory Visit: Payer: PRIVATE HEALTH INSURANCE

## 2024-01-20 DIAGNOSIS — E1122 Type 2 diabetes mellitus with diabetic chronic kidney disease: Secondary | ICD-10-CM | POA: Diagnosis not present

## 2024-01-20 DIAGNOSIS — I5033 Acute on chronic diastolic (congestive) heart failure: Secondary | ICD-10-CM | POA: Diagnosis not present

## 2024-01-20 DIAGNOSIS — I132 Hypertensive heart and chronic kidney disease with heart failure and with stage 5 chronic kidney disease, or end stage renal disease: Secondary | ICD-10-CM | POA: Diagnosis not present

## 2024-01-20 DIAGNOSIS — J439 Emphysema, unspecified: Secondary | ICD-10-CM | POA: Diagnosis not present

## 2024-01-20 DIAGNOSIS — I1A0 Resistant hypertension: Secondary | ICD-10-CM | POA: Diagnosis not present

## 2024-01-20 DIAGNOSIS — N185 Chronic kidney disease, stage 5: Secondary | ICD-10-CM | POA: Diagnosis not present

## 2024-01-20 NOTE — Transitions of Care (Post Inpatient/ED Visit) (Signed)
 Transition of Care week 2  Visit Note  01/20/2024  Name: [name] MRN: [id number]          DOB: November 05, 1945  Situation: Patient enrolled in [facility] 30-day program. Visit completed with patient by telephone.   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,  "small"   Anxiety    Arthritis    Cataract    bilateral - surgery   CKD (chronic kidney disease), stage IV (HCC)    followed by [facility]   Colon polyps    Depression    Diabetes mellitus 1998   type 2   Diverticulosis    Emphysema of lung (HCC)    "no one has evry told me that"   ESOPHAGEAL STRICTURE 08/27/2008   Family history of adverse reaction to anesthesia    daughter n/v   GERD (gastroesophageal reflux disease)    Glaucoma    bilateral   Heart failure (HCC) 01/04/2024   Hemorrhoids    hx   Hiatal hernia    Hyperlipidemia    Hypertension 2000   Migraines    Neuropathy    fingers and toes   Peripheral vascular disease (HCC)    Phlebitis    30 years ago  left leg   Stroke [facility])    multiple mini strokes ( brain aneurysm ).  Right side weaker.    Assessment: Patient Reported Symptoms: Cognitive Cognitive Status: Alert and oriented to person, place, and time      Neurological Neurological Review of Symptoms: No symptoms reported    HEENT HEENT Symptoms Reported: No symptoms reported      Cardiovascular Cardiovascular Symptoms Reported: Swelling in legs or feet Does patient have uncontrolled Hypertension?: Yes Is patient checking Blood Pressure at home?: Yes Patient's Recent BP reading at home: 170/64 Cardiovascular Conditions: Hypertension, Heart failure Cardiovascular Management Strategies: Fluid modification, Adequate rest, Medication therapy Weight: 190 lb (86.2 kg) Cardiovascular Self-Management Outcome: 2  (bad) Cardiovascular Comment: Swelling is not going down and weight remaining the same  Respiratory Respiratory Symptoms Reported: Shortness of breath Other Respiratory Symptoms: due to extra fluid Respiratory Conditions: COPD Respiratory Self-Management Outcome: 1 (very bad) Respiratory Comment: [facility] nurse coming today  Endocrine Patient reports the following symptoms related to hypoglycemia or hyperglycemia : No symptoms reported    Gastrointestinal Gastrointestinal Symptoms Reported: Not assessed      Genitourinary Genitourinary Symptoms Reported: Other Other Genitourinary Symptoms: Still retaining fluid will see specialist next week Dr. Charlotte Cookey Genitourinary Conditions: Chronic kidney disease Genitourinary Self-Management Outcome: 2 (bad)  Integumentary Integumentary Symptoms Reported: No symptoms reported    Musculoskeletal Musculoskelatal Symptoms Reviewed: Weakness, Unsteady gait Musculoskeletal Conditions: Back pain, Other Other Musculoskeletal Conditions: Chronic back and knees arthritis Musculoskeletal Management Strategies: Medication therapy, Adequate rest, Activity Musculoskeletal Self-Management Outcome: 2 (bad)      Psychosocial Psychosocial Symptoms Reported: Anxiety - if selected complete GAD Additional Psychological Details: Still so much shortness of breath and fluid Behavioral Health Conditions: Anxiety       Vitals:   01/20/24 1115  BP: (!) 170/64    Medications: Patient states no new medications except Metolazone 5 mg 1 tablet added three times a week. Started this morning. Deferred medication review for Home Health coming today due to shortness of breath. [facility] Home health nurse is filling  her pill box   Goals Addressed             This Visit's Progress    VBCI Transitions of Care (TOC) Care Plan   No change    Problems:  Recent Hospitalization for treatment of CHF and CKD Stage new regimen Medication management barrier      Home health RN is  filling her pill box, diurectic added today   Goal:  Over the next 30 days, the patient will not experience hospital readmission Continue to work with [facility] nurse and therapy to not have to return to the hospital  Interventions:    Chronic Kidney Disease Interventions: Reviewed medications with patient and discussed importance of compliance    Advised patient, providing education and rationale, to monitor blood pressure daily and record, calling PCP for findings outside established parameters    Reviewed scheduled/upcoming provider appointments including    Last practice recorded BP readings:  BP Readings from Last 3 Encounters:  01/10/24 (!) 161/39  12/21/23 (!) 176/51  12/12/23 132/74   Most recent eGFR/CrCl:  Lab Results  Component Value Date   EGFR 15 (L) 09/19/2023    No components found for: "CRCL"  Patient Self Care Activities:  Attend all scheduled provider appointments Participate in Transition of Care Program/Attend TOC scheduled calls call office if I gain more than 2 pounds in one day or 5 pounds in one week track weight in diary follow rescue plan if symptoms flare-up  Continue to take rest breaks and contact provide for worsening shortness of breath with weight gain  Plan:  Next PCP appointment scheduled for: 01/23/24 at 1:00 pm  Telephone follow up appointment with care management team member scheduled for:  01/27/24 11:15 am        Brown Cape, RN, BSN, CCM [place]  [facility], [facility] RN Care Manager Direct Dial: [phone]

## 2024-01-20 NOTE — Telephone Encounter (Signed)
 [name] has been notified and voices understanding.

## 2024-01-20 NOTE — Patient Instructions (Signed)
 Visit Information  Thank you for taking time to visit with me today. Please don't hesitate to contact me if I can be of assistance to you before our next scheduled telephone appointment.  Our next appointment is by telephone on 01/27/2024 at 11:15 am  Following is a copy of your care plan:   Goals Addressed             This Visit's Progress    VBCI Transitions of Care (TOC) Care Plan   No change    Problems:  Recent Hospitalization for treatment of CHF and CKD Stage new regimen Medication management barrier      Home health RN is filling her pill box, diurectic added today   Goal:  Over the next 30 days, the patient will not experience hospital readmission Continue to work with [facility] nurse and therapy to not have to return to the hospital  Interventions:    Chronic Kidney Disease Interventions: Reviewed medications with patient and discussed importance of compliance    Advised patient, providing education and rationale, to monitor blood pressure daily and record, calling PCP for findings outside established parameters    Reviewed scheduled/upcoming provider appointments including    Last practice recorded BP readings:  BP Readings from Last 3 Encounters:  01/10/24 (!) 161/39  12/21/23 (!) 176/51  12/12/23 132/74   Most recent eGFR/CrCl:  Lab Results  Component Value Date   EGFR 15 (L) 09/19/2023    No components found for: "CRCL"  Patient Self Care Activities:  Attend all scheduled provider appointments Participate in Transition of Care Program/Attend TOC scheduled calls call office if I gain more than 2 pounds in one day or 5 pounds in one week track weight in diary follow rescue plan if symptoms flare-up  Continue to take rest breaks and contact provide for worsening shortness of breath with weight gain  Plan:  Next PCP appointment scheduled for: 01/23/24 at 1:00 pm  Telephone follow up appointment with care management team member scheduled for:  01/27/24 11:15 am         Patient verbalizes understanding of instructions and care plan provided today and agrees to view in MyChart. Active MyChart status and patient understanding of how to access instructions and care plan via MyChart confirmed with patient.     Telephone follow up appointment with care management team member scheduled for: The patient has been provided with contact information for the care management team and has been advised to call with any health related questions or concerns.   Please call the care guide team at [phone] if you need to cancel or reschedule your appointment.   Please call the [place]  National Suicide Prevention Lifeline: 417-300-2663 or TTY: 305-316-3379 TTY (970)839-2848) to talk to a trained counselor if you are experiencing a Mental Health or Behavioral Health Crisis or need someone to talk to. Brown Cape, RN, BSN, CCM [facility], [facility] Health RN Care Manager Direct Dial: [phone]

## 2024-01-20 NOTE — Telephone Encounter (Signed)
 Jasmine, Please see message below -if a sliding scale is absolutely needed, please give this to the Home Health provider (see telephone number below).  However, please tell them not to ignore the mealtime insulin  and definitely not to rely only on the sliding scale. Thank you! C

## 2024-01-23 ENCOUNTER — Ambulatory Visit: Attending: Surgery | Admitting: Physician Assistant

## 2024-01-23 ENCOUNTER — Other Ambulatory Visit: Payer: Self-pay

## 2024-01-23 ENCOUNTER — Encounter: Payer: Self-pay | Admitting: Nurse Practitioner

## 2024-01-23 ENCOUNTER — Ambulatory Visit: Payer: Medicare Other | Admitting: Internal Medicine

## 2024-01-23 ENCOUNTER — Ambulatory Visit (HOSPITAL_COMMUNITY)
Admission: RE | Admit: 2024-01-23 | Discharge: 2024-01-23 | Disposition: A | Discharge status: Home / Self Care | Payer: PRIVATE HEALTH INSURANCE | Source: Ambulatory Visit | Attending: Surgery | Admitting: Surgery

## 2024-01-23 ENCOUNTER — Ambulatory Visit (INDEPENDENT_AMBULATORY_CARE_PROVIDER_SITE_OTHER): Payer: Self-pay | Admitting: Nurse Practitioner

## 2024-01-23 VITALS — BP 158/31 | HR 60 | Wt 189.0 lb

## 2024-01-23 VITALS — BP 170/61 | HR 59 | Wt 189.2 lb

## 2024-01-23 DIAGNOSIS — E785 Hyperlipidemia, unspecified: Secondary | ICD-10-CM

## 2024-01-23 DIAGNOSIS — R002 Palpitations: Secondary | ICD-10-CM | POA: Insufficient documentation

## 2024-01-23 DIAGNOSIS — I509 Heart failure, unspecified: Secondary | ICD-10-CM | POA: Diagnosis not present

## 2024-01-23 DIAGNOSIS — J439 Emphysema, unspecified: Secondary | ICD-10-CM | POA: Diagnosis not present

## 2024-01-23 DIAGNOSIS — N185 Chronic kidney disease, stage 5: Secondary | ICD-10-CM | POA: Diagnosis not present

## 2024-01-23 DIAGNOSIS — N186 End stage renal disease: Secondary | ICD-10-CM

## 2024-01-23 DIAGNOSIS — Z09 Encounter for follow-up examination after completed treatment for conditions other than malignant neoplasm: Secondary | ICD-10-CM | POA: Insufficient documentation

## 2024-01-23 DIAGNOSIS — I10 Essential (primary) hypertension: Secondary | ICD-10-CM

## 2024-01-23 DIAGNOSIS — E1169 Type 2 diabetes mellitus with other specified complication: Secondary | ICD-10-CM | POA: Diagnosis not present

## 2024-01-23 DIAGNOSIS — R531 Weakness: Secondary | ICD-10-CM | POA: Insufficient documentation

## 2024-01-23 DIAGNOSIS — R0600 Dyspnea, unspecified: Secondary | ICD-10-CM | POA: Diagnosis not present

## 2024-01-23 NOTE — Assessment & Plan Note (Signed)
 Smoking cessation encouraged Continue Trelegy

## 2024-01-23 NOTE — Assessment & Plan Note (Signed)
 Chronic condition Continue carvedilol  6.25 mg twice daily Follow-up with cardiology Heart rate and rhythm was normal in the office today

## 2024-01-23 NOTE — Progress Notes (Signed)
 POST OPERATIVE OFFICE NOTE    CC:  F/u for surgery  HPI:  [name] is a 78 y.o. female who is here for a postop visit.  She recently underwent creation of a right arm brachiocephalic fistula on 12/21/2023 by Dr. Charlotte Cookey.  This was done for future permanent dialysis access.  Pt returns today for follow up.  Pt states she is doing okay.  She says that she is having a hard time tolerating some of her antihypertensive medications right now because they make her "heart beat fast".  She denies any issues with her right arm incision such as drainage, dehiscence, or tenderness.  She denies any symptoms of steal in the right hand such as weakness, numbness, excessive coldness, or pain.  She says that her right hand "cramps" sometimes when trying to use it.   Allergies  Allergen Reactions   Ciprofloxacin  Swelling   Ozempic  (0.25 Or 0.5 Mg-Dose) [Semaglutide (0.25 Or 0.5mg -Dos)] Nausea And Vomiting   Plavix [Clopidogrel Bisulfate] Palpitations   Amoxicillin Itching and Swelling    FACE & EYES SWELL   Ace Inhibitors Cough    Sore throat   Atorvastatin      myalgias   Lyrica [Pregabalin] Itching and Nausea Only     gain weight   Penicillins Other (See Comments)    Pt unsure if there is an allergic reaction     Current Outpatient Medications  Medication Sig Dispense Refill   acetaminophen  (TYLENOL ) 500 MG tablet Take 1,000 mg by mouth 2 (two) times daily as needed for mild pain (pain score 1-3).     amLODipine  (NORVASC ) 10 MG tablet Take 1 tablet (10 mg total) by mouth daily. 30 tablet 0   aspirin  EC 81 MG tablet Take 1 tablet (81 mg total) by mouth daily. Swallow whole. 30 tablet 12   busPIRone  (BUSPAR ) 5 MG tablet TAKE 1 TABLET BY MOUTH THREE TIMES A DAY (Patient taking differently: Take 5 mg by mouth 2 (two) times daily.) 270 tablet 1   calcitRIOL  (ROCALTROL ) 0.25 MCG capsule Take 0.25 mcg by mouth daily.     carvedilol  (COREG ) 6.25 MG tablet Take 1 tablet (6.25 mg total) by mouth 2 (two)  times daily with a meal. 60 tablet 0   cloNIDine  (CATAPRES ) 0.1 MG tablet Take 1 tablet (0.1 mg total) by mouth at bedtime. 60 tablet 11   Fluticasone -Umeclidin-Vilant (TRELEGY ELLIPTA ) 200-62.5-25 MCG/ACT AEPB Inhale 1 puff into the lungs daily. 3 each 3   furosemide  (LASIX ) 40 MG tablet Take 80 mg by mouth 2 (two) times daily.     hydrALAZINE  (APRESOLINE ) 100 MG tablet Take 1 tablet (100 mg total) by mouth every 8 (eight) hours. 90 tablet 0   hydrOXYzine  (ATARAX ) 25 MG tablet Take 1 tablet (25 mg total) by mouth 3 (three) times daily as needed for anxiety. 30 tablet 0   insulin  aspart (NOVOLOG  FLEXPEN) 100 UNIT/ML FlexPen Inject 10-14 Units into the skin 3 (three) times daily before meals. (Patient taking differently: Inject 8-14 Units into the skin 3 (three) times daily as needed for high blood sugar.) 45 mL 3   insulin  degludec (TRESIBA  FLEXTOUCH) 200 UNIT/ML FlexTouch Pen Inject 40 Units into the skin daily. 18 mL 3   isosorbide  mononitrate (IMDUR ) 60 MG 24 hr tablet Take 1 tablet (60 mg total) by mouth daily. 30 tablet 0   meclizine  (ANTIVERT ) 25 MG tablet 1/2 tab up to 3 times daily for motion sickness/dizziness. (Patient taking differently: Take 25 mg by mouth 3 (three) times  daily as needed for dizziness.) 30 tablet 0   metolazone (ZAROXOLYN) 5 MG tablet Take 5 mg by mouth daily. Take 1 Tablet three times a week     Omega-3 Fatty Acids (FISH OIL) 1000 MG CAPS Take 1,000 mg by mouth in the morning and at bedtime.     oxyCODONE -acetaminophen  (PERCOCET/ROXICET) 5-325 MG tablet Take 1 tablet by mouth every 6 (six) hours as needed. (Patient not taking: Reported on 01/11/2024) 12 tablet 0   rOPINIRole  (REQUIP ) 0.25 MG tablet Take 0.25 mg by mouth at bedtime.     rosuvastatin  (CRESTOR ) 20 MG tablet Take 1 tablet (20 mg total) by mouth daily. 90 tablet 0   No current facility-administered medications for this visit.     ROS:  See HPI  Physical Exam:  Incision: Well-healed right upper arm  incision without signs of infection Extremities: Palpable right radial pulse.  Right brachiocephalic fistula with great thrill throughout the upper arm, cannot be visualized at the surface of the skin Neuro: Intact motor and sensation of right hand.  Grip strength 5/5  Studies: Dialysis Duplex: (01/23/2024)  +--------------------+----------+-----------------+--------+  AVF                PSV (cm/s)Flow Vol (mL/min)Comments  +--------------------+----------+-----------------+--------+  Native artery inflow   354          1048                 +--------------------+----------+-----------------+--------+  AVF Anastomosis        678                               +--------------------+----------+-----------------+--------+     +------------+----------+-------------+----------+--------+  OUTFLOW VEINPSV (cm/s)Diameter (cm)Depth (cm)Describe  +------------+----------+-------------+----------+--------+  Shoulder      223        0.54        0.94             +------------+----------+-------------+----------+--------+  Prox UA        236        0.56        0.96             +------------+----------+-------------+----------+--------+  Mid UA         149        0.59        0.97             +------------+----------+-------------+----------+--------+  Dist UA        201        0.81        0.85             +------------+----------+-------------+----------+--------+  AC Fossa       554        0.54        0.77             +------------+----------+-------------+----------+--------+    Assessment/Plan:  This is a 78 y.o. female who is here for postop visit   -The patient recently underwent creation of a right brachiocephalic fistula for future dialysis access -Her right arm incision is well-healed without signs of infection or dehiscence -She denies any symptoms of steal in the right hand such as weakness, numbness, excessive coldness, or pain.  She says  that her right hand cramps up sometimes with use, however this is tolerable.  She has a palpable right radial pulse -Duplex demonstrates a well maturing brachiocephalic fistula with great flow volumes of  1048 mL/min.  Diameters of the fistula are near 6 mm.  The fistula is fairly deep in the upper arm and will need to be superficial lysed prior to use - On exam her right brachiocephalic fistula has a great thrill throughout the upper arm.  It cannot be visualized at the level of the skin - I have explained to the patient that she will require right brachiocephalic fistula superficialization prior to future fistula use.  I have explained the procedure to the patient and risk versus benefits.  She is agreeable for superficialization. - We will schedule the patient for right brachiocephalic AV fistula superficialization with Dr. Charlotte Cookey in the next couple of weeks   Deneise Finlay, PA-C [facility] [phone]   Clinic MD:  Charlotte Cookey

## 2024-01-23 NOTE — Assessment & Plan Note (Signed)
Recommend starting physical therapy.

## 2024-01-23 NOTE — Assessment & Plan Note (Signed)
 Hospital chart reviewed, including discharge summary Medications reconciled and reviewed with the patient in detail

## 2024-01-23 NOTE — H&P (View-Only) (Signed)
 POST OPERATIVE OFFICE NOTE    CC:  F/u for surgery  HPI:  [name] is a 78 y.o. female who is here for a postop visit.  She recently underwent creation of a right arm brachiocephalic fistula on 12/21/2023 by Dr. Charlotte Cookey.  This was done for future permanent dialysis access.  Pt returns today for follow up.  Pt states she is doing okay.  She says that she is having a hard time tolerating some of her antihypertensive medications right now because they make her "heart beat fast".  She denies any issues with her right arm incision such as drainage, dehiscence, or tenderness.  She denies any symptoms of steal in the right hand such as weakness, numbness, excessive coldness, or pain.  She says that her right hand "cramps" sometimes when trying to use it.   Allergies  Allergen Reactions   Ciprofloxacin  Swelling   Ozempic  (0.25 Or 0.5 Mg-Dose) [Semaglutide (0.25 Or 0.5mg -Dos)] Nausea And Vomiting   Plavix [Clopidogrel Bisulfate] Palpitations   Amoxicillin Itching and Swelling    FACE & EYES SWELL   Ace Inhibitors Cough    Sore throat   Atorvastatin      myalgias   Lyrica [Pregabalin] Itching and Nausea Only     gain weight   Penicillins Other (See Comments)    Pt unsure if there is an allergic reaction     Current Outpatient Medications  Medication Sig Dispense Refill   acetaminophen  (TYLENOL ) 500 MG tablet Take 1,000 mg by mouth 2 (two) times daily as needed for mild pain (pain score 1-3).     amLODipine  (NORVASC ) 10 MG tablet Take 1 tablet (10 mg total) by mouth daily. 30 tablet 0   aspirin  EC 81 MG tablet Take 1 tablet (81 mg total) by mouth daily. Swallow whole. 30 tablet 12   busPIRone  (BUSPAR ) 5 MG tablet TAKE 1 TABLET BY MOUTH THREE TIMES A DAY (Patient taking differently: Take 5 mg by mouth 2 (two) times daily.) 270 tablet 1   calcitRIOL  (ROCALTROL ) 0.25 MCG capsule Take 0.25 mcg by mouth daily.     carvedilol  (COREG ) 6.25 MG tablet Take 1 tablet (6.25 mg total) by mouth 2 (two)  times daily with a meal. 60 tablet 0   cloNIDine  (CATAPRES ) 0.1 MG tablet Take 1 tablet (0.1 mg total) by mouth at bedtime. 60 tablet 11   Fluticasone -Umeclidin-Vilant (TRELEGY ELLIPTA ) 200-62.5-25 MCG/ACT AEPB Inhale 1 puff into the lungs daily. 3 each 3   furosemide  (LASIX ) 40 MG tablet Take 80 mg by mouth 2 (two) times daily.     hydrALAZINE  (APRESOLINE ) 100 MG tablet Take 1 tablet (100 mg total) by mouth every 8 (eight) hours. 90 tablet 0   hydrOXYzine  (ATARAX ) 25 MG tablet Take 1 tablet (25 mg total) by mouth 3 (three) times daily as needed for anxiety. 30 tablet 0   insulin  aspart (NOVOLOG  FLEXPEN) 100 UNIT/ML FlexPen Inject 10-14 Units into the skin 3 (three) times daily before meals. (Patient taking differently: Inject 8-14 Units into the skin 3 (three) times daily as needed for high blood sugar.) 45 mL 3   insulin  degludec (TRESIBA  FLEXTOUCH) 200 UNIT/ML FlexTouch Pen Inject 40 Units into the skin daily. 18 mL 3   isosorbide  mononitrate (IMDUR ) 60 MG 24 hr tablet Take 1 tablet (60 mg total) by mouth daily. 30 tablet 0   meclizine  (ANTIVERT ) 25 MG tablet 1/2 tab up to 3 times daily for motion sickness/dizziness. (Patient taking differently: Take 25 mg by mouth 3 (three) times  daily as needed for dizziness.) 30 tablet 0   metolazone (ZAROXOLYN) 5 MG tablet Take 5 mg by mouth daily. Take 1 Tablet three times a week     Omega-3 Fatty Acids (FISH OIL) 1000 MG CAPS Take 1,000 mg by mouth in the morning and at bedtime.     oxyCODONE -acetaminophen  (PERCOCET/ROXICET) 5-325 MG tablet Take 1 tablet by mouth every 6 (six) hours as needed. (Patient not taking: Reported on 01/11/2024) 12 tablet 0   rOPINIRole  (REQUIP ) 0.25 MG tablet Take 0.25 mg by mouth at bedtime.     rosuvastatin  (CRESTOR ) 20 MG tablet Take 1 tablet (20 mg total) by mouth daily. 90 tablet 0   No current facility-administered medications for this visit.     ROS:  See HPI  Physical Exam:  Incision: Well-healed right upper arm  incision without signs of infection Extremities: Palpable right radial pulse.  Right brachiocephalic fistula with great thrill throughout the upper arm, cannot be visualized at the surface of the skin Neuro: Intact motor and sensation of right hand.  Grip strength 5/5  Studies: Dialysis Duplex: (01/23/2024)  +--------------------+----------+-----------------+--------+  AVF                PSV (cm/s)Flow Vol (mL/min)Comments  +--------------------+----------+-----------------+--------+  Native artery inflow   354          1048                 +--------------------+----------+-----------------+--------+  AVF Anastomosis        678                               +--------------------+----------+-----------------+--------+     +------------+----------+-------------+----------+--------+  OUTFLOW VEINPSV (cm/s)Diameter (cm)Depth (cm)Describe  +------------+----------+-------------+----------+--------+  Shoulder      223        0.54        0.94             +------------+----------+-------------+----------+--------+  Prox UA        236        0.56        0.96             +------------+----------+-------------+----------+--------+  Mid UA         149        0.59        0.97             +------------+----------+-------------+----------+--------+  Dist UA        201        0.81        0.85             +------------+----------+-------------+----------+--------+  AC Fossa       554        0.54        0.77             +------------+----------+-------------+----------+--------+    Assessment/Plan:  This is a 78 y.o. female who is here for postop visit   -The patient recently underwent creation of a right brachiocephalic fistula for future dialysis access -Her right arm incision is well-healed without signs of infection or dehiscence -She denies any symptoms of steal in the right hand such as weakness, numbness, excessive coldness, or pain.  She says  that her right hand cramps up sometimes with use, however this is tolerable.  She has a palpable right radial pulse -Duplex demonstrates a well maturing brachiocephalic fistula with great flow volumes of  1048 mL/min.  Diameters of the fistula are near 6 mm.  The fistula is fairly deep in the upper arm and will need to be superficial lysed prior to use - On exam her right brachiocephalic fistula has a great thrill throughout the upper arm.  It cannot be visualized at the level of the skin - I have explained to the patient that she will require right brachiocephalic fistula superficialization prior to future fistula use.  I have explained the procedure to the patient and risk versus benefits.  She is agreeable for superficialization. - We will schedule the patient for right brachiocephalic AV fistula superficialization with Dr. Charlotte Cookey in the next couple of weeks   Deneise Finlay, PA-C [facility] [phone]   Clinic MD:  Charlotte Cookey

## 2024-01-23 NOTE — Assessment & Plan Note (Addendum)
 Lab Results  Component Value Date   CHOL 249 (H) 08/08/2023   HDL 57 08/08/2023   LDLCALC 166 (H) 08/08/2023   LDLDIRECT 191 (H) 09/19/2023   TRIG 129 08/08/2023   CHOLHDL 4.4 08/08/2023    Lab Results  Component Value Date   HGBA1C 7.3 (A) 10/31/2023      Checking direct LDL On Crestor  20 mg daily LDL goal is less than 55 Continue NovoLog  and Tresiba  for DM  Maintain close follow-up with endocrinologist

## 2024-01-23 NOTE — Patient Instructions (Signed)

## 2024-01-23 NOTE — Assessment & Plan Note (Addendum)
 Had labs done by nephrologist recently, states that her kidney function was stable Avoid NSAIDs and other nephrotoxic agent and follow-up with nephrologist Has a fistula in place but will require right brachiocephalic fistula superficialization prior to future fistula use. Has upcoming surgery on Feb 01, 2024

## 2024-01-23 NOTE — Assessment & Plan Note (Addendum)
 Diastolic blood pressure is low but systolic blood pressure is high Stated diastolic blood pressure at home has been in the 50s Continue amlodipine  10 mg daily, carvedilol  6.25 mg twice daily clonidine  0.1 mg at bedtime, hydralazine  100 mg every 8 hours, isosorbide  mononitrate 60 mg daily, furosemide  80 mg twice daily, metolazone 5 mg 3 times weekly Encouraged to maintain close follow-up with nephrology and cardiology DASH diet advised      01/23/2024    1:39 PM 01/23/2024    1:23 PM 01/23/2024    8:31 AM 01/20/2024   11:15 AM 01/11/2024   11:51 AM 01/10/2024   12:07 PM 01/10/2024   10:20 AM  BP/Weight  Systolic BP 158 155 170 170 -- 161 162  Diastolic BP 31 39 61 64 -- 39 39  Wt. (Lbs)  189 189.2 190 190    BMI  30.51 kg/m2 30.54 kg/m2 30.67 kg/m2 30.67 kg/m2

## 2024-01-23 NOTE — Assessment & Plan Note (Signed)
 Continue furosemide  80 mg twice daily, metolazone 5 mg 3 times weekly Continue Trelegy 1 puff daily for COPD Encouraged to maintain close follow-up with nephrology and cardiology Smoking cessation encouraged

## 2024-01-23 NOTE — Progress Notes (Signed)
 Established Patient Office Visit  Subjective:  Patient ID: [name], female    DOB: 1946/06/06  Age: 78 y.o. MRN: [id number]  CC:  Chief Complaint  Patient presents with   Hypertension   Hyperlipidemia    HPI [name] is a 78 y.o. female  has a past medical history of Abnormal findings on esophagogastroduodenoscopy (EGD) (07/2010), Allergy, Aneurysm (HCC) (2003), Anxiety, Arthritis, Cataract, CKD (chronic kidney disease), stage IV (HCC), Colon polyps, Depression, Diabetes mellitus (1998), Diverticulosis, Emphysema of lung (HCC), ESOPHAGEAL STRICTURE (08/27/2008), Family history of adverse reaction to anesthesia, GERD (gastroesophageal reflux disease), Glaucoma, Heart failure (HCC) (01/04/2024), Hemorrhoids, Hiatal hernia, Hyperlipidemia, Hypertension (2000), Migraines, Neuropathy, Peripheral vascular disease (HCC), Phlebitis, and Stroke (HCC).  Patient presents for hospitalization follow-up  Patient was on admission from 01/03/2024 to 01/10/2024 for CKD stage V.  She had presented to the hospital with progressive dyspnea on exertion, worsening lower extremity edema.  She has a fistula created in preparation for hemodialysis but it has not matured.  Nephrology has referred her to the emergency department due to volume overload.  Cardiology and nephrology managing her blood pressure medications. Metolazone was discontinued and started on isosorbide  mononitrate 60 mg daily.  She has seen nephrologist after her hospitalization ,stated that nephrologist told her to restart metolazone and take 3 times weekly.  Lower extremity edema is better but she continues to have shortness of breath on exertion, also reports worsening palpitations.  No fever, chills, chest pain, abdominal pain, nausea, vomiting.  Has not been able to start physical therapy due to her continued shortness of breath.  Home health RN sees her twice weekly to assist with medication, blood pressure check and weight check.  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,  "small"   Anxiety    Arthritis    Cataract    bilateral - surgery   CKD (chronic kidney disease), stage IV (HCC)    followed by [facility]   Colon polyps    Depression    Diabetes mellitus 1998   type 2   Diverticulosis    Emphysema of lung (HCC)    "no one has evry told me that"   ESOPHAGEAL STRICTURE 08/27/2008   Family history of adverse reaction to anesthesia    daughter n/v   GERD (gastroesophageal reflux disease)    Glaucoma    bilateral   Heart failure (HCC) 01/04/2024   Hemorrhoids    hx   Hiatal hernia    Hyperlipidemia    Hypertension 2000   Migraines    Neuropathy    fingers and toes   Peripheral vascular disease (HCC)    Phlebitis    30 years ago  left leg   Stroke [facility])    multiple mini strokes ( brain aneurysm ).  Right side weaker.    Past Surgical History:  Procedure Laterality Date   ABDOMINAL AORTAGRAM N/A 01/04/2012   Procedure: ABDOMINAL AORTAGRAM;  Surgeon: Margherita Shell, MD;  Location: [facility] CATH LAB;  Service: Cardiovascular;  Laterality: N/A;   ABDOMINAL AORTAGRAM N/A 08/15/2012   Procedure: ABDOMINAL Tommi Fraise;  Surgeon: Margherita Shell, MD;  Location: [facility] CATH LAB;  Service: Cardiovascular;  Laterality: N/A;   ABDOMINAL AORTOGRAM W/LOWER EXTREMITY Left 11/17/2021   Procedure: ABDOMINAL AORTOGRAM W/LOWER  EXTREMITY;  Surgeon: Margherita Shell, MD;  Location: [facility] INVASIVE CV LAB;  Service: Cardiovascular;  Laterality: Left;   ABDOMINAL AORTOGRAM W/LOWER EXTREMITY N/A 03/09/2022   Procedure: ABDOMINAL AORTOGRAM W/LOWER EXTREMITY;  Surgeon: Margherita Shell, MD;  Location: [facility] INVASIVE CV LAB;  Service: Cardiovascular;  Laterality: N/A;   ABDOMINAL AORTOGRAM W/LOWER EXTREMITY N/A 03/15/2023   Procedure: ABDOMINAL AORTOGRAM W/LOWER EXTREMITY;   Surgeon: Margherita Shell, MD;  Location: [facility] INVASIVE CV LAB;  Service: Cardiovascular;  Laterality: N/A;   ABDOMINAL HYSTERECTOMY  1984   AV FISTULA PLACEMENT Right 12/21/2023   Procedure: RIGHT ARM BRACHIOCEPHALIC ARTERIOVENOUS (AV) FISTULA CREATION;  Surgeon: Margherita Shell, MD;  Location: [facility] OR;  Service: Vascular;  Laterality: Right;   BREAST SURGERY     cataract surgery Right 10/22/2015   cataract surgery Left 11/05/2015   CESAREAN SECTION     CHOLECYSTECTOMY     COLONOSCOPY  07/2010   DENTAL SURGERY  05/05/2012   left lower    ESOPHAGOGASTRODUODENOSCOPY  07/2010   EYE SURGERY  07/2013   Laser-Glaucoma   FEMORAL ARTERY STENT  05/11/2011   Left superficial femoral and popliteal artery   FEMORAL-POPLITEAL BYPASS GRAFT Left 11/18/2021   Procedure: LEFT FEMORAL-POPLITEAL ARTERY BYPASS GRAFT;  Surgeon: Margherita Shell, MD;  Location: [facility] OR;  Service: Vascular;  Laterality: Left;   HERNIA REPAIR     times two   KNEE SURGERY     LOWER EXTREMITY ANGIOGRAM Left 08/15/2012   Procedure: LOWER EXTREMITY ANGIOGRAM;  Surgeon: Margherita Shell, MD;  Location: [facility] CATH LAB;  Service: Cardiovascular;  Laterality: Left;  lt leg angio   PERIPHERAL VASCULAR BALLOON ANGIOPLASTY Left 03/09/2022   Procedure: PERIPHERAL VASCULAR BALLOON ANGIOPLASTY;  Surgeon: Margherita Shell, MD;  Location: [facility] INVASIVE CV LAB;  Service: Cardiovascular;  Laterality: Left;   PERIPHERAL VASCULAR BALLOON ANGIOPLASTY Left 03/15/2023   Procedure: PERIPHERAL VASCULAR BALLOON ANGIOPLASTY;  Surgeon: Margherita Shell, MD;  Location: [facility] INVASIVE CV LAB;  Service: Cardiovascular;  Laterality: Left;   rotator cuff surgery     THYROID  SURGERY      Family History  Problem Relation Age of Onset   CAD Mother 82       Died of MI   Hypertension Mother    Heart attack Mother    Heart disease Mother    CAD Father 12       Died of MI   Heart disease Father    Heart attack Father    CAD Brother 47       Two brothers died of MI    Heart attack Brother    Heart disease Brother        Amputation   Diabetes Sister    Hypertension Sister    Heart attack Brother    Heart disease Brother    Stroke Sister    Colon cancer Neg Hx    Stomach cancer Neg Hx    Esophageal cancer Neg Hx     Social History   Socioeconomic History   Marital status: Married    Spouse name: Not on file   Number of children: 2   Years of education: Not on file   Highest education level: 12th grade  Occupational History   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  Tobacco Use   Smoking status: Every Day    Current packs/day: 0.40    Average packs/day: 0.4 packs/day for 48.0 years (20.0 ttl pk-yrs)    Types: Cigarettes  Passive exposure: Never   Smokeless tobacco: Never   Tobacco comments:    11/18/20 in process of quiting    03/31/23 5 cigarettes a day   Vaping Use   Vaping status: Never Used  Substance and Sexual Activity   Alcohol  use: Never   Drug use: Never   Sexual activity: Not Currently    Birth control/protection: None, Surgical    Comment: Hysterctomy  Other Topics Concern   Not on file  Social History Narrative   Lives with husband.        Social Drivers of Corporate investment banker Strain: Low Risk  (09/19/2023)   Overall Financial Resource Strain (CARDIA)    Difficulty of Paying Living Expenses: Not hard at all  Food Insecurity: No Food Insecurity (01/11/2024)   Hunger Vital Sign    Worried About Running Out of Food in the Last Year: Never true    Ran Out of Food in the Last Year: Never true  Transportation Needs: No Transportation Needs (01/11/2024)   PRAPARE - Administrator, Civil Service (Medical): No    Lack of Transportation (Non-Medical): No  Physical Activity: Unknown (09/19/2023)   Exercise Vital Sign    Days of Exercise per Week: 0 days    Minutes of Exercise per Session: Not on file  Stress: Stress Concern Present (09/19/2023)   [employer] of Occupational Health -  Occupational Stress Questionnaire    Feeling of Stress : Very much  Social Connections: Socially Integrated (01/04/2024)   Social Connection and Isolation Panel [NHANES]    Frequency of Communication with Friends and Family: More than three times a week    Frequency of Social Gatherings with Friends and Family: Three times a week    Attends Religious Services: More than 4 times per year    Active Member of Clubs or Organizations: Yes    Attends Banker Meetings: 1 to 4 times per year    Marital Status: Married  Catering manager Violence: Not At Risk (01/11/2024)   Humiliation, Afraid, Rape, and Kick questionnaire    Fear of Current or Ex-Partner: No    Emotionally Abused: No    Physically Abused: No    Sexually Abused: No    Outpatient Medications Prior to Visit  Medication Sig Dispense Refill   acetaminophen  (TYLENOL ) 500 MG tablet Take 1,000 mg by mouth 2 (two) times daily as needed for mild pain (pain score 1-3).     amLODipine  (NORVASC ) 10 MG tablet Take 1 tablet (10 mg total) by mouth daily. 30 tablet 0   aspirin  EC 81 MG tablet Take 1 tablet (81 mg total) by mouth daily. Swallow whole. 30 tablet 12   busPIRone  (BUSPAR ) 5 MG tablet TAKE 1 TABLET BY MOUTH THREE TIMES A DAY (Patient taking differently: Take 5 mg by mouth 2 (two) times daily.) 270 tablet 1   calcitRIOL  (ROCALTROL ) 0.25 MCG capsule Take 0.25 mcg by mouth daily.     carvedilol  (COREG ) 6.25 MG tablet Take 1 tablet (6.25 mg total) by mouth 2 (two) times daily with a meal. 60 tablet 0   cloNIDine  (CATAPRES ) 0.1 MG tablet Take 1 tablet (0.1 mg total) by mouth at bedtime. 60 tablet 11   Fluticasone -Umeclidin-Vilant (TRELEGY ELLIPTA ) 200-62.5-25 MCG/ACT AEPB Inhale 1 puff into the lungs daily. 3 each 3   furosemide  (LASIX ) 40 MG tablet Take 80 mg by mouth 2 (two) times daily.     hydrALAZINE  (APRESOLINE ) 100 MG tablet Take 1 tablet (  100 mg total) by mouth every 8 (eight) hours. 90 tablet 0   hydrOXYzine  (ATARAX ) 25  MG tablet Take 1 tablet (25 mg total) by mouth 3 (three) times daily as needed for anxiety. 30 tablet 0   insulin  aspart (NOVOLOG  FLEXPEN) 100 UNIT/ML FlexPen Inject 10-14 Units into the skin 3 (three) times daily before meals. (Patient taking differently: Inject 8-14 Units into the skin 3 (three) times daily as needed for high blood sugar.) 45 mL 3   insulin  degludec (TRESIBA  FLEXTOUCH) 200 UNIT/ML FlexTouch Pen Inject 40 Units into the skin daily. 18 mL 3   isosorbide  mononitrate (IMDUR ) 60 MG 24 hr tablet Take 1 tablet (60 mg total) by mouth daily. 30 tablet 0   meclizine  (ANTIVERT ) 25 MG tablet 1/2 tab up to 3 times daily for motion sickness/dizziness. (Patient taking differently: Take 25 mg by mouth 3 (three) times daily as needed for dizziness.) 30 tablet 0   metolazone (ZAROXOLYN) 5 MG tablet Take 5 mg by mouth daily. Take 1 Tablet three times a week     Omega-3 Fatty Acids (FISH OIL) 1000 MG CAPS Take 1,000 mg by mouth in the morning and at bedtime.     rOPINIRole  (REQUIP ) 0.25 MG tablet Take 0.25 mg by mouth at bedtime.     rosuvastatin  (CRESTOR ) 20 MG tablet Take 1 tablet (20 mg total) by mouth daily. 90 tablet 0   oxyCODONE -acetaminophen  (PERCOCET/ROXICET) 5-325 MG tablet Take 1 tablet by mouth every 6 (six) hours as needed. (Patient not taking: Reported on 01/23/2024) 12 tablet 0   No facility-administered medications prior to visit.    Allergies  Allergen Reactions   Ciprofloxacin  Swelling   Ozempic  (0.25 Or 0.5 Mg-Dose) [Semaglutide (0.25 Or 0.5mg -Dos)] Nausea And Vomiting   Plavix [Clopidogrel Bisulfate] Palpitations   Amoxicillin Itching and Swelling    FACE & EYES SWELL   Ace Inhibitors Cough    Sore throat   Atorvastatin      myalgias   Lyrica [Pregabalin] Itching and Nausea Only     gain weight   Penicillins Other (See Comments)    Pt unsure if there is an allergic reaction     ROS Review of Systems  Constitutional:  Positive for fatigue. Negative for appetite change,  chills and fever.  HENT:  Negative for congestion, postnasal drip, rhinorrhea and sneezing.   Respiratory:  Positive for shortness of breath. Negative for cough and wheezing.   Cardiovascular:  Positive for palpitations. Negative for chest pain and leg swelling.  Gastrointestinal:  Negative for abdominal pain, constipation, nausea and vomiting.  Genitourinary:  Negative for difficulty urinating, dysuria, flank pain and frequency.  Musculoskeletal:  Negative for arthralgias, back pain, joint swelling and myalgias.  Skin:  Negative for color change, pallor, rash and wound.  Neurological:  Positive for weakness. Negative for seizures, speech difficulty, numbness and headaches.  Psychiatric/Behavioral:  Negative for behavioral problems, confusion, self-injury and suicidal ideas.       Objective:    Physical Exam Vitals and nursing note reviewed.  Constitutional:      General: She is not in acute distress.    Appearance: Normal appearance. She is obese. She is not ill-appearing, toxic-appearing or diaphoretic.  HENT:     Mouth/Throat:     Mouth: Mucous membranes are moist.     Pharynx: Oropharynx is clear. No oropharyngeal exudate or posterior oropharyngeal erythema.  Eyes:     General: No scleral icterus.       Right eye: No discharge.  Left eye: No discharge.     Extraocular Movements: Extraocular movements intact.     Conjunctiva/sclera: Conjunctivae normal.  Cardiovascular:     Rate and Rhythm: Normal rate and regular rhythm.     Pulses: Normal pulses.     Heart sounds: Murmur heard.     No friction rub. No gallop.  Pulmonary:     Effort: Pulmonary effort is normal. No respiratory distress.     Breath sounds: Normal breath sounds. No stridor. No wheezing, rhonchi or rales.  Chest:     Chest wall: No tenderness.  Abdominal:     General: There is no distension.     Palpations: Abdomen is soft.     Tenderness: There is no abdominal tenderness. There is no right CVA  tenderness, left CVA tenderness or guarding.  Musculoskeletal:        General: No swelling, tenderness, deformity or signs of injury.     Right lower leg: No edema.     Left lower leg: No edema.     Comments: No edema noted  Skin:    General: Skin is warm and dry.     Capillary Refill: Capillary refill takes less than 2 seconds.     Coloration: Skin is not jaundiced or pale.     Findings: No bruising, erythema or lesion.  Neurological:     Mental Status: She is alert and oriented to person, place, and time.     Motor: Weakness present.     Gait: Gait normal.  Psychiatric:        Mood and Affect: Mood normal.        Behavior: Behavior normal.        Thought Content: Thought content normal.        Judgment: Judgment normal.     BP (!) 158/31   Pulse 60   Wt 189 lb (85.7 kg)   SpO2 100%   BMI 30.51 kg/m  Wt Readings from Last 3 Encounters:  01/23/24 189 lb (85.7 kg)  01/23/24 189 lb 3.2 oz (85.8 kg)  01/20/24 190 lb (86.2 kg)    Lab Results  Component Value Date   TSH 2.44 08/08/2023   Lab Results  Component Value Date   WBC 10.8 (H) 01/03/2024   HGB 14.2 01/03/2024   HCT 43.8 01/03/2024   MCV 99.8 01/03/2024   PLT 247 01/03/2024   Lab Results  Component Value Date   NA 136 01/10/2024   K 4.2 01/10/2024   CO2 23 01/10/2024   GLUCOSE 310 (H) 01/10/2024   BUN 55 (H) 01/10/2024   CREATININE 3.46 (H) 01/10/2024   BILITOT 0.5 01/03/2024   ALKPHOS 71 01/03/2024   AST 29 01/03/2024   ALT 18 01/03/2024   PROT 5.8 (L) 01/03/2024   ALBUMIN  2.2 (L) 01/10/2024   CALCIUM  8.3 (L) 01/10/2024   ANIONGAP 11 01/10/2024   EGFR 11.0 01/13/2024   GFR 27.39 (L) 01/14/2020   Lab Results  Component Value Date   CHOL 249 (H) 08/08/2023   Lab Results  Component Value Date   HDL 57 08/08/2023   Lab Results  Component Value Date   LDLCALC 166 (H) 08/08/2023   Lab Results  Component Value Date   TRIG 129 08/08/2023   Lab Results  Component Value Date   CHOLHDL 4.4  08/08/2023   Lab Results  Component Value Date   HGBA1C 7.3 (A) 10/31/2023      Assessment & Plan:   Problem List Items Addressed This  Visit       Cardiovascular and Mediastinum   Essential hypertension   Diastolic blood pressure is low but systolic blood pressure is high Stated diastolic blood pressure at home has been in the 50s Continue amlodipine  10 mg daily, carvedilol  6.25 mg twice daily clonidine  0.1 mg at bedtime, hydralazine  100 mg every 8 hours, isosorbide  mononitrate 60 mg daily, furosemide  80 mg twice daily, metolazone 5 mg 3 times weekly Encouraged to maintain close follow-up with nephrology and cardiology DASH diet advised      01/23/2024    1:39 PM 01/23/2024    1:23 PM 01/23/2024    8:31 AM 01/20/2024   11:15 AM 01/11/2024   11:51 AM 01/10/2024   12:07 PM 01/10/2024   10:20 AM  BP/Weight  Systolic BP 158 155 170 170 -- 161 162  Diastolic BP 31 39 61 64 -- 39 39  Wt. (Lbs)  189 189.2 190 190    BMI  30.51 kg/m2 30.54 kg/m2 30.67 kg/m2 30.67 kg/m2             Heart failure (HCC)   Continue furosemide  80 mg twice daily, metolazone, 5 mg 3 times weekly carvedilol  6.25 mg twice daily, isosorbide  mononitrate 60 mg daily Follow-up with cardiology with cardiology for increased shortness of breath or edema         Respiratory   Emphysema of lung (HCC)   Smoking cessation encouraged Continue Trelegy        Endocrine   Hyperlipidemia associated with type 2 diabetes mellitus (HCC)   Lab Results  Component Value Date   CHOL 249 (H) 08/08/2023   HDL 57 08/08/2023   LDLCALC 166 (H) 08/08/2023   LDLDIRECT 191 (H) 09/19/2023   TRIG 129 08/08/2023   CHOLHDL 4.4 08/08/2023    Lab Results  Component Value Date   HGBA1C 7.3 (A) 10/31/2023      Checking direct LDL On Crestor  20 mg daily LDL goal is less than 55 Continue NovoLog  and Tresiba  for DM  Maintain close follow-up with endocrinologist      Relevant Orders   Direct LDL     Genitourinary   CKD  (chronic kidney disease) stage 5, GFR less than 15 ml/min (HCC)   Had labs done by nephrologist recently, states that her kidney function was stable Avoid NSAIDs and other nephrotoxic agent and follow-up with nephrologist Has a fistula in place but will require right brachiocephalic fistula superficialization prior to future fistula use. Has upcoming surgery on Feb 01, 2024        Other   Dyspnea - Primary   Continue furosemide  80 mg twice daily, metolazone 5 mg 3 times weekly Continue Trelegy 1 puff daily for COPD Encouraged to maintain close follow-up with nephrology and cardiology Smoking cessation encouraged      Hospital discharge follow-up   Hospital chart reviewed, including discharge summary Medications reconciled and reviewed with the patient in detail       Palpitations   Chronic condition Continue carvedilol  6.25 mg twice daily Follow-up with cardiology Heart rate and rhythm was normal in the office today      Weakness   Recommend starting physical therapy       No orders of the defined types were placed in this encounter.   Follow-up: Return in about 4 months (around 05/25/2024).    Adasyn Mcadams R Djuana Littleton, FNP

## 2024-01-23 NOTE — Assessment & Plan Note (Signed)
 Continue furosemide  80 mg twice daily, metolazone, 5 mg 3 times weekly carvedilol  6.25 mg twice daily, isosorbide  mononitrate 60 mg daily Follow-up with cardiology with cardiology for increased shortness of breath or edema

## 2024-01-24 ENCOUNTER — Other Ambulatory Visit: Payer: Self-pay | Admitting: Nurse Practitioner

## 2024-01-24 DIAGNOSIS — Z8673 Personal history of transient ischemic attack (TIA), and cerebral infarction without residual deficits: Secondary | ICD-10-CM

## 2024-01-24 DIAGNOSIS — I779 Disorder of arteries and arterioles, unspecified: Secondary | ICD-10-CM

## 2024-01-24 DIAGNOSIS — E1169 Type 2 diabetes mellitus with other specified complication: Secondary | ICD-10-CM

## 2024-01-24 LAB — LDL CHOLESTEROL, DIRECT: LDL Direct: 85 mg/dL (ref 0–99)

## 2024-01-24 MED ORDER — ROSUVASTATIN CALCIUM 40 MG PO TABS
40.0000 mg | ORAL_TABLET | Freq: Every day | ORAL | 1 refills | Status: DC
Start: 2024-01-24 — End: 2024-03-20

## 2024-01-24 NOTE — Telephone Encounter (Signed)
 Pt was seen yesterday and will continue to follow up with Cardiology and nephrology. KH

## 2024-01-25 ENCOUNTER — Telehealth: Payer: Self-pay

## 2024-01-25 ENCOUNTER — Encounter: Payer: Self-pay | Admitting: Cardiology

## 2024-01-25 DIAGNOSIS — E1122 Type 2 diabetes mellitus with diabetic chronic kidney disease: Secondary | ICD-10-CM | POA: Diagnosis not present

## 2024-01-25 DIAGNOSIS — J439 Emphysema, unspecified: Secondary | ICD-10-CM | POA: Diagnosis not present

## 2024-01-25 DIAGNOSIS — I5033 Acute on chronic diastolic (congestive) heart failure: Secondary | ICD-10-CM | POA: Diagnosis not present

## 2024-01-25 DIAGNOSIS — I132 Hypertensive heart and chronic kidney disease with heart failure and with stage 5 chronic kidney disease, or end stage renal disease: Secondary | ICD-10-CM | POA: Diagnosis not present

## 2024-01-25 DIAGNOSIS — N185 Chronic kidney disease, stage 5: Secondary | ICD-10-CM | POA: Diagnosis not present

## 2024-01-25 DIAGNOSIS — I1A0 Resistant hypertension: Secondary | ICD-10-CM | POA: Diagnosis not present

## 2024-01-25 NOTE — Telephone Encounter (Signed)
 Completed.

## 2024-01-25 NOTE — Telephone Encounter (Signed)
 Copied from CRM [id number]. Topic: General - Other >> Jan 25, 2024 12:50 PM Felizardo Hotter wrote: Reason for CRM: Received call from [facility] health per Ofilia Benton ph: [phone] stated pt stop taking busPIRone  (BUSPAR ) 5 MG tablet, hydrOXYzine  (ATARAX ) 25 MG tablet, hydrALAZINE  (APRESOLINE ) 100 MG tablet, and complaining of heart beating hard. Please call pt at [phone].

## 2024-01-26 DIAGNOSIS — N185 Chronic kidney disease, stage 5: Secondary | ICD-10-CM | POA: Diagnosis not present

## 2024-01-26 DIAGNOSIS — I1A0 Resistant hypertension: Secondary | ICD-10-CM | POA: Diagnosis not present

## 2024-01-26 DIAGNOSIS — I132 Hypertensive heart and chronic kidney disease with heart failure and with stage 5 chronic kidney disease, or end stage renal disease: Secondary | ICD-10-CM | POA: Diagnosis not present

## 2024-01-26 DIAGNOSIS — E1122 Type 2 diabetes mellitus with diabetic chronic kidney disease: Secondary | ICD-10-CM | POA: Diagnosis not present

## 2024-01-26 DIAGNOSIS — I5033 Acute on chronic diastolic (congestive) heart failure: Secondary | ICD-10-CM | POA: Diagnosis not present

## 2024-01-26 DIAGNOSIS — J439 Emphysema, unspecified: Secondary | ICD-10-CM | POA: Diagnosis not present

## 2024-01-27 ENCOUNTER — Telehealth: Payer: Self-pay | Admitting: Cardiology

## 2024-01-27 ENCOUNTER — Telehealth: Payer: Self-pay

## 2024-01-27 DIAGNOSIS — J439 Emphysema, unspecified: Secondary | ICD-10-CM | POA: Diagnosis not present

## 2024-01-27 DIAGNOSIS — I5033 Acute on chronic diastolic (congestive) heart failure: Secondary | ICD-10-CM | POA: Diagnosis not present

## 2024-01-27 DIAGNOSIS — E1122 Type 2 diabetes mellitus with diabetic chronic kidney disease: Secondary | ICD-10-CM | POA: Diagnosis not present

## 2024-01-27 DIAGNOSIS — I132 Hypertensive heart and chronic kidney disease with heart failure and with stage 5 chronic kidney disease, or end stage renal disease: Secondary | ICD-10-CM | POA: Diagnosis not present

## 2024-01-27 DIAGNOSIS — N185 Chronic kidney disease, stage 5: Secondary | ICD-10-CM | POA: Diagnosis not present

## 2024-01-27 DIAGNOSIS — I1A0 Resistant hypertension: Secondary | ICD-10-CM | POA: Diagnosis not present

## 2024-01-27 NOTE — Telephone Encounter (Signed)
  [employer] home health calling, she said, she I concern about the pt's heart palpitations and may need a sooner appt than 01/20/24

## 2024-01-27 NOTE — Telephone Encounter (Signed)
 Spoke to [place] at [facility]. [name] stated that Pt's daughter isn't understanding some of the medication her mom is being put on or the importance of those medications. [name] stated she "needs a come to Jesus moment".  The daughter does not live in town. We talked about possible solutions on this and [place] would like to request a detailed note from the OV on 5/20 be placed on MyChart for the daughters viewing. I will send this request to Provider.

## 2024-01-30 ENCOUNTER — Encounter (HOSPITAL_COMMUNITY): Payer: Self-pay | Admitting: Surgery

## 2024-01-30 ENCOUNTER — Other Ambulatory Visit: Payer: Self-pay

## 2024-01-30 NOTE — Pre-Procedure Instructions (Signed)
 -------------  SDW INSTRUCTIONS given:  Your procedure is scheduled on 5/14.  Report to [facility] Main Entrance "A" at 08:50 A.M., and check in at the Admitting office.  Any questions or running late day of surgery: call [phone]    Remember:  Do not eat or drink after midnight the night before your surgery     Take these medicines the morning of surgery with A SIP OF WATER  amlodipine , ASA, buspar , coreg , trelegy, hydralazine , hydroxyzine , imdur , crestor ,        May take these medicines IF NEEDED: tylenol , meclizine   As of today, STOP taking any Aleve, Naproxen, Ibuprofen, Motrin, Advil, Goody's, BC's, all herbal medications, fish oil, and all vitamins.  WHAT DO I DO ABOUT MY DIABETES MEDICATION?      THE MORNING OF SURGERY, take 20 units (50%) of insulin  degludec (TRESIBA  FLEXTOUCH)   If your CBG is greater than 220 mg/dL, you may take  of your sliding scale (correction) dose of insulin  aspart (NOVOLOG  FLEXPEN)    HOW TO MANAGE YOUR DIABETES BEFORE AND AFTER SURGERY  Why is it important to control my blood sugar before and after surgery? Improving blood sugar levels before and after surgery helps healing and can limit problems. A way of improving blood sugar control is eating a healthy diet by:  Eating less sugar and carbohydrates  Increasing activity/exercise  Talking with your doctor about reaching your blood sugar goals High blood sugars (greater than 180 mg/dL) can raise your risk of infections and slow your recovery, so you will need to focus on controlling your diabetes during the weeks before surgery. Make sure that the doctor who takes care of your diabetes knows about your planned surgery including the date and location.  How do I manage my blood sugar before surgery? Check your blood sugar at least 4 times a day, starting 2 days before surgery, to make sure that the level is not too high or low.  Check your blood sugar the morning of your surgery when you  wake up and every 2 hours until you get to the Short Stay unit.  If your blood sugar is less than 70 mg/dL, you will need to treat for low blood sugar: Do not take insulin . Treat a low blood sugar (less than 70 mg/dL) with  cup of clear juice (cranberry or apple), 4 glucose tablets, OR glucose gel. Recheck blood sugar in 15 minutes after treatment (to make sure it is greater than 70 mg/dL). If your blood sugar is not greater than 70 mg/dL on recheck, call [phone] for further instructions. Report your blood sugar to the short stay nurse when you get to Short Stay.  If you are admitted to the hospital after surgery: Your blood sugar will be checked by the staff and you will probably be given insulin  after surgery (instead of oral diabetes medicines) to make sure you have good blood sugar levels. The goal for blood sugar control after surgery is 80-180 mg/dL.   Do NOT Smoke (Tobacco/Vaping) 24 hours prior to your procedure  If you use a CPAP at night, you may bring all equipment for your overnight stay.     You will be asked to remove any contacts, glasses, piercing's, hearing aid's, dentures/partials prior to surgery. Please bring cases for these items if needed.     Patients discharged the day of surgery will not be allowed to drive home, and someone needs to stay with them for 24 hours.  SURGICAL WAITING ROOM  VISITATION Patients may have no more than 2 support people in the waiting area - these visitors may rotate.   Pre-op  nurse will coordinate an appropriate time for 1 ADULT support person, who may not rotate, to accompany patient in pre-op .  Children under the age of 76 must have an adult with them who is not the patient and must remain in the main waiting area with an adult.  If the patient needs to stay at the hospital during part of their recovery, the visitor guidelines for inpatient rooms apply.  Please refer to the [facility] website for the visitor guidelines for any  additional information.   Special instructions:   [place]- Preparing For Surgery   Please follow these instructions carefully.   Shower the NIGHT BEFORE SURGERY and the MORNING OF SURGERY with DIAL Soap.   Pat yourself dry with a CLEAN TOWEL.  Wear CLEAN PAJAMAS to bed the night before surgery  Place CLEAN SHEETS on your bed the night of your first shower and DO NOT SLEEP WITH PETS.   Additional instructions for the day of surgery: DO NOT APPLY any lotions, deodorants, cologne, or perfumes.   Do not wear jewelry or makeup Do not wear nail polish, gel polish, artificial nails, or any other type of covering on natural nails (fingers and toes) Do not bring valuables to the hospital. [facility] is not responsible for valuables/personal belongings. Put on clean/comfortable clothes.  Please brush your teeth.  Ask your nurse before applying any prescription medications to the skin.

## 2024-01-30 NOTE — Progress Notes (Signed)
 PCP - Folashade Paseda, FNP Cardiologist - Dr. Eilleen Grates  PPM/ICD - denies   Chest x-ray - 01/07/24 EKG - 01/03/24 Stress Test - 11/23/18 ECHO - 01/04/24 Cardiac Cath - denies  CPAP - denies  Fasting Blood Sugar - 86-106 Checks Blood Sugar 2-3 times/day  Blood Thinner Instructions: n/a Aspirin  Instructions: continue  ERAS Protcol - no, NPO  COVID TEST- n/a  Anesthesia review: yes, cardiac hx. Margretta Shi reviewed pt 12/2023  Patient verbally denies any shortness of breath, fever, cough and chest pain during phone call      Questions were answered. Patient verbalized understanding of instructions.

## 2024-01-31 ENCOUNTER — Telehealth: Payer: Self-pay

## 2024-01-31 DIAGNOSIS — E1122 Type 2 diabetes mellitus with diabetic chronic kidney disease: Secondary | ICD-10-CM | POA: Diagnosis not present

## 2024-01-31 DIAGNOSIS — J439 Emphysema, unspecified: Secondary | ICD-10-CM | POA: Diagnosis not present

## 2024-01-31 DIAGNOSIS — I1A0 Resistant hypertension: Secondary | ICD-10-CM | POA: Diagnosis not present

## 2024-01-31 DIAGNOSIS — I5033 Acute on chronic diastolic (congestive) heart failure: Secondary | ICD-10-CM | POA: Diagnosis not present

## 2024-01-31 DIAGNOSIS — I132 Hypertensive heart and chronic kidney disease with heart failure and with stage 5 chronic kidney disease, or end stage renal disease: Secondary | ICD-10-CM | POA: Diagnosis not present

## 2024-01-31 DIAGNOSIS — N185 Chronic kidney disease, stage 5: Secondary | ICD-10-CM | POA: Diagnosis not present

## 2024-01-31 NOTE — Progress Notes (Signed)
 Anesthesia Chart Review: SAME DAY WORK-UP  Case: [id number] Date/Time: 02/01/24 1105   Procedure: FISTULA SUPERFICIALIZATION (Right)   Anesthesia type: Choice   Diagnosis: ESRD (end stage renal disease) (HCC) [N18.6]   Pre-op  diagnosis: ESRD   [facility] ROOM 12 / [facility]   Surgeons: Margherita Shell, MD       DISCUSSION: Patient is a 78 year old female scheduled for the above procedure. She has CKD stage 5. S/p creation of right brachiocephalic AVF 12/21/23.   History includes smoking, HTN, HLD, DM2 (with neuropathy; IDDM since 2005), CVA, carotid artery stenosis, PAD (atherectomy left SFA & popliteal arteries, left SFA stent 05/11/11; left SFA & popliteal angioplasty 01/04/12 & 08/15/12; left FPBG 11/18/21, s/p DCB angioplasty left FPBG 03/09/22 & 03/15/23), emphysema, CKD (stage V), GERD, hiatal hernia, cerebral aneurysm, glaucoma, thyroid  surgery, ventral hernia (s/p repair 2003, 2010).     Admission 01/03/24 - 01/10/24 for acute on chronic diastolic CHF in setting of CKD. Volume status improved with furosemide . Cardiology consulted for resistant hypertension and elevated HS Troponin 55>70>127. Coronary atherosclerosis on prior chest CT. Low risk stress test in 2020. No plans for ischemic evaluation due to lack of symptoms and advanced CKD. HTN difficult to manage due to wide pulse pressure/low diastolic BP. Options also limited by CKD and HR. Continue Coreg  6.25 mg BID, Hydralazine  100mg  TID, Amlodipine  10mg  daily. Imdur  decreased to 60 mg daily and added clonidine  0.1 mg Q HS. She is also on Lasix  80 mg BID and Zaroxolyn 5 mg every other day.   She is followed by cardiologist Dr. Lavonne Prairie. She has known coronary calcifications on imaging. Low risk stress test in 2020. Echo on 01/04/24 during recent admission showed LVEF 60-65%, no RWMA, mild concentric LVH, grade 1 DD, normal RV systolic function, trivial MR. As above, last cardiology evaluation (by Dr. Janita Mellow and Dr. Jerryl Morin) s were in April 2025  during in-patient stay for volume overload. Hospital follow-up is scheduled with APP on 02/07/24.   Last visit with pulmonology 11/22/23 for follow-up dyspnea, smoking, suspected COPD, and lung nodule. Trelegy prescribed in October 2024 and felt to be helpful.  PFTs and chest CT planned prior to next visit in six months.  A1c 7.3% on 10/31/23. She is on Novolog  8-14 units TID with meals as needed, Tresiba  40 units Q day.  She is a same day work-up. Anesthesia team to evaluate on the day of surgery.     VS: Ht 5\' 6"  (1.676 m)   Wt 83.5 kg   BMI 29.70 kg/m  BP Readings from Last 3 Encounters:  01/31/24 (!) 157/52  01/23/24 (!) 158/31  01/23/24 (!) 170/61   Pulse Readings from Last 3 Encounters:  01/23/24 60  01/23/24 (!) 59  01/10/24 (!) 59     PROVIDERS: Paseda, Folashade R, FNP is PCP  Eilleen Grates, MD is cardiologist Caye Cocks, MD is vascular surgeon Mannam, Praveeen, MD is pulmonologist Emilie Harden, MD is endocrinologist Cristi Donalds, MD is nephrologist   LABS: For day of surgery as indicated. Last results in [facility] include: Lab Results  Component Value Date   WBC 10.8 (H) 01/03/2024   HGB 14.2 01/03/2024   HCT 43.8 01/03/2024   PLT 247 01/03/2024   GLUCOSE 310 (H) 01/10/2024   CHOL 249 (H) 08/08/2023   TRIG 129 08/08/2023   HDL 57 08/08/2023   LDLDIRECT 85 01/23/2024   LDLCALC 166 (H) 08/08/2023   ALT 18 01/03/2024   AST 29 01/03/2024  NA 136 01/10/2024   K 4.2 01/10/2024   CL 102 01/10/2024   CREATININE 3.46 (H) 01/10/2024   BUN 55 (H) 01/10/2024   CO2 23 01/10/2024   TSH 2.44 08/08/2023   INR 0.9 11/17/2021   HGBA1C 7.3 (A) 10/31/2023   MICROALBUR 285.3 08/08/2023     IMAGES: 1V CXR 01/07/24: IMPRESSION: Minimal atelectasis or scarring at the left lung base.  CT Chest 07/21/23: IMPRESSION: 1. Stable appearance of ground-glass attenuating nodule within the medial right apex measuring 1.6 x 1.1 cm. This has been stable  since 10/10/2017. 2. New solid nodule within the medial left upper lobe measures 6 mm. Non-contrast chest CT at 6-12 months is recommended. If the nodule is stable at time of repeat CT, then future CT at 18-24 months (from today's scan) is considered optional for low-risk patients, but is recommended for high-risk patients. This recommendation follows the consensus statement: Guidelines for Management of Incidental Pulmonary Nodules Detected on CT Images: From the Fleischner Society 2017; Radiology 2017; 284:228-243. 3. Coronary artery calcifications. 4. Aortic Atherosclerosis (ICD10-I70.0) and Emphysema (ICD10-J43.9).     EKG: 01/04/24: NSR Sinus rhythm Nonspecific T abnormalities, lateral leads Prolonged QT interval (QT 496/QTcB 520 ms) Confirmed by Eve Hinders [phone]) on 01/05/2024 8:52:29 PM    CV: Echo 01/04/24: IMPRESSIONS   1. Left ventricular ejection fraction, by estimation, is 60 to 65%. The  left ventricle has normal function. The left ventricle has no regional  wall motion abnormalities. There is mild concentric left ventricular  hypertrophy. Left ventricular diastolic  parameters are consistent with Grade I diastolic dysfunction (impaired  relaxation).   2. Right ventricular systolic function is normal. The right ventricular  size is normal.   3. The mitral valve is normal in structure. Trivial mitral valve  regurgitation. No evidence of mitral stenosis.   4. The aortic valve is tricuspid. Aortic valve regurgitation is not  visualized. Aortic valve sclerosis is present, with no evidence of aortic  valve stenosis.   5. The inferior vena cava is normal in size with greater than 50%  Respiratory variability, suggesting right atrial pressure of 3 mmHg.    US  Carotid 08/09/23: Summary:  - Right Carotid: Velocities in the right ICA are consistent with a 40-59% stenosis. Hemodynamically significant plaque >50% visualized in the proximal CCA.  - Left Carotid:  Velocities in the left ICA are consistent with a 1-39% stenosis. Hemodynamically significant plaque >50% visualized in the mid-distal CCA.  - Vertebrals:  Bilateral vertebral arteries demonstrate antegrade flow.  - Subclavians: Right subclavian artery flow was disturbed. Normal flow hemodynamics were seen in the left subclavian artery.      Long term ZioXT monitor 04/04/23 - 04/18/23: Predominant rhythm was normal sinus Infrequent runs of non sustained ventricular tachycardia with the longest run being 7 beats Occasional runs of supraventricular tachycardia longest being 16 beats.   No sustained arrhythmias      Nuclear stress test 11/23/18: Nuclear stress EF: 56%. The left ventricular ejection fraction is normal (55-65%). There was no ST segment deviation noted during stress. The study is normal. This is a low risk study. Normal stress nuclear study with no ischemia or infarction.  Gated ejection fraction 56% with normal wall motion.    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,  "small"   Anxiety    Arthritis    Cataract  bilateral - surgery   CKD (chronic kidney disease), stage IV (HCC)    followed by [facility]   Colon polyps    Depression    Diabetes mellitus 1998   type 2   Diverticulosis    Emphysema of lung (HCC)    "no one has evry told me that"   ESOPHAGEAL STRICTURE 08/27/2008   Family history of adverse reaction to anesthesia    daughter n/v   GERD (gastroesophageal reflux disease)    Glaucoma    bilateral   Heart failure (HCC) 01/04/2024   Hemorrhoids    hx   Hiatal hernia    Hyperlipidemia    Hypertension 2000   Migraines    Neuropathy    fingers and toes   Peripheral vascular disease (HCC)    Phlebitis    30 years ago  left leg   Stroke [facility])    multiple mini strokes ( brain aneurysm ).  Right side weaker. 2 strokes    Past  Surgical History:  Procedure Laterality Date   ABDOMINAL AORTAGRAM N/A 01/04/2012   Procedure: ABDOMINAL AORTAGRAM;  Surgeon: Margherita Shell, MD;  Location: [facility] CATH LAB;  Service: Cardiovascular;  Laterality: N/A;   ABDOMINAL AORTAGRAM N/A 08/15/2012   Procedure: ABDOMINAL Tommi Fraise;  Surgeon: Margherita Shell, MD;  Location: [facility] CATH LAB;  Service: Cardiovascular;  Laterality: N/A;   ABDOMINAL AORTOGRAM W/LOWER EXTREMITY Left 11/17/2021   Procedure: ABDOMINAL AORTOGRAM W/LOWER EXTREMITY;  Surgeon: Margherita Shell, MD;  [facility] INVASIVE CV LAB;  Service: Cardiovascular;  Laterality: Left;   ABDOMINAL AORTOGRAM W/LOWER EXTREMITY N/A 03/09/2022   Procedure: ABDOMINAL AORTOGRAM W/LOWER EXTREMITY;  Surgeon: Margherita Shell, MD;  [facility] INVASIVE CV LAB;  Service: Cardiovascular;  Laterality: N/A;   ABDOMINAL AORTOGRAM W/LOWER EXTREMITY N/A 03/15/2023   Procedure: ABDOMINAL AORTOGRAM W/LOWER EXTREMITY;  Surgeon: Margherita Shell, MD;  [facility] INVASIVE CV LAB;  Service: Cardiovascular;  Laterality: N/A;   ABDOMINAL HYSTERECTOMY  1984   AV FISTULA PLACEMENT Right 12/21/2023   Procedure: RIGHT ARM BRACHIOCEPHALIC ARTERIOVENOUS (AV) FISTULA CREATION;  Surgeon: Margherita Shell, MD;  Location[facility] OR;  Service: Vascular;  Laterality: Right;   BREAST SURGERY     cataract surgery Right 10/22/2015   cataract surgery Left 11/05/2015   CESAREAN SECTION     CHOLECYSTECTOMY     COLONOSCOPY  07/2010   DENTAL SURGERY  05/05/2012   left lower    ESOPHAGOGASTRODUODENOSCOPY  07/2010   EYE SURGERY  07/2013   Laser-Glaucoma   FEMORAL ARTERY STENT  05/11/2011   Left superficial femoral and popliteal artery   FEMORAL-POPLITEAL BYPASS GRAFT Left 11/18/2021   Procedure: LEFT FEMORAL-POPLITEAL ARTERY BYPASS GRAFT;  Surgeon: Margherita Shell, MD;  [facility] OR;  Service: Vascular;  Laterality: Left;   HERNIA REPAIR     times two   KNEE SURGERY     LOWER EXTREMITY ANGIOGRAM Left 08/15/2012    Procedure: LOWER EXTREMITY ANGIOGRAM;  Surgeon: Margherita Shell, MD;  Location: [facility] CATH LAB;  Service: Cardiovascular;  Laterality: Left;  lt leg angio   PERIPHERAL VASCULAR BALLOON ANGIOPLASTY Left 03/09/2022   Procedure: PERIPHERAL VASCULAR BALLOON ANGIOPLASTY;  Surgeon: Margherita Shell, MD;  [facility] INVASIVE CV LAB;  Service: Cardiovascular;  Laterality: Left;   PERIPHERAL VASCULAR BALLOON ANGIOPLASTY Left 03/15/2023   Procedure: PERIPHERAL VASCULAR BALLOON ANGIOPLASTY;  Surgeon: Margherita Shell, MD;  [facility] INVASIVE CV LAB;  Service: Cardiovascular;  Laterality: Left;   rotator cuff surgery  THYROID  SURGERY      MEDICATIONS: No current facility-administered medications for this encounter.    acetaminophen  (TYLENOL ) 500 MG tablet   amLODipine  (NORVASC ) 10 MG tablet   aspirin  EC 81 MG tablet   busPIRone  (BUSPAR ) 5 MG tablet   calcitRIOL  (ROCALTROL ) 0.25 MCG capsule   carvedilol  (COREG ) 6.25 MG tablet   cloNIDine  (CATAPRES ) 0.1 MG tablet   Fluticasone -Umeclidin-Vilant (TRELEGY ELLIPTA ) 200-62.5-25 MCG/ACT AEPB   furosemide  (LASIX ) 40 MG tablet   hydrALAZINE  (APRESOLINE ) 100 MG tablet   hydrOXYzine  (ATARAX ) 25 MG tablet   insulin  aspart (NOVOLOG  FLEXPEN) 100 UNIT/ML FlexPen   insulin  degludec (TRESIBA  FLEXTOUCH) 200 UNIT/ML FlexTouch Pen   isosorbide  mononitrate (IMDUR ) 60 MG 24 hr tablet   meclizine  (ANTIVERT ) 25 MG tablet   metolazone (ZAROXOLYN) 5 MG tablet   Omega-3 Fatty Acids (FISH OIL) 1000 MG CAPS   rOPINIRole  (REQUIP ) 0.25 MG tablet   rosuvastatin  (CRESTOR ) 40 MG tablet    Ella Gun, PA-C Surgical Short Stay/Anesthesiology [facility] Phone [phone] [facility] Phone [phone] 01/31/2024 1:02 PM

## 2024-01-31 NOTE — Transitions of Care (Post Inpatient/ED Visit) (Signed)
 Transition of Care week 3  Visit Note  01/31/2024  Name: [name] MRN: [id number]          DOB: 28-Jan-1946  Situation: Patient enrolled in [facility] 30-day program. Visit completed with patient by telephone.   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,  "small"   Anxiety    Arthritis    Cataract    bilateral - surgery   CKD (chronic kidney disease), stage IV (HCC)    followed by [facility]   Colon polyps    Depression    Diabetes mellitus 1998   type 2   Diverticulosis    Emphysema of lung (HCC)    "no one has evry told me that"   ESOPHAGEAL STRICTURE 08/27/2008   Family history of adverse reaction to anesthesia    daughter n/v   GERD (gastroesophageal reflux disease)    Glaucoma    bilateral   Heart failure (HCC) 01/04/2024   Hemorrhoids    hx   Hiatal hernia    Hyperlipidemia    Hypertension 2000   Migraines    Neuropathy    fingers and toes   Peripheral vascular disease (HCC)    Phlebitis    30 years ago  left leg   Stroke [facility])    multiple mini strokes ( brain aneurysm ).  Right side weaker. 2 strokes    Assessment: Patient Reported Symptoms: Cognitive Cognitive Status: Alert and oriented to person, place, and time      Neurological Neurological Review of Symptoms: No symptoms reported    HEENT HEENT Symptoms Reported: No symptoms reported      Cardiovascular Cardiovascular Symptoms Reported: Swelling in legs or feet Does patient have uncontrolled Hypertension?: Yes Is patient checking Blood Pressure at home?: Yes Patient's Recent BP reading at home: 157/52 (Home monitoring and HH nurse checking) Weight: 184 lb (83.5 kg)  Respiratory Respiratory Symptoms Reported: Shortness of breath Respiratory Conditions: COPD  Endocrine Patient reports the following  symptoms related to hypoglycemia or hyperglycemia : No symptoms reported Is patient diabetic?: Yes Endocrine Conditions: Diabetes  Gastrointestinal Gastrointestinal Symptoms Reported: Constipation Additional Gastrointestinal Details: uncomfortable today Gastrointestinal Conditions: Constipation Gastrointestinal Self-Management Outcome: 2 (bad)    Genitourinary Genitourinary Symptoms Reported: Other Other Genitourinary Symptoms: Has appointment and outpatient surgery 02/01/24 with Dr. Charlotte Cookey Genitourinary Conditions: Chronic kidney disease Genitourinary Management Strategies: Activity, Adequate rest Genitourinary Self-Management Outcome: 2 (bad)  Integumentary Integumentary Symptoms Reported: Not assessed    Musculoskeletal Musculoskelatal Symptoms Reviewed: Weakness, Unsteady gait        Psychosocial Psychosocial Symptoms Reported: Anxiety - if selected complete GAD Behavioral Health Conditions: Anxiety Behavioral Management Strategies: Activity, Adequate rest, Support system, Community resources [facility] nurse helps alot) Behavioral Health Self-Management Outcome: 3 (uncertain)       Vitals:   01/31/24 1017  BP: (!) 157/52    Medications Reviewed Today   Medications were not reviewed in this encounter      Goals Addressed             This Visit's Progress    VBCI Transitions of Care (TOC) Care Plan   No change    Problems:  Recent Hospitalization for treatment of CHF and CKD Stage new regimen Medication management barrier      Home health RN is filling  her pill box, diurectic added today   GI: slow with bowels moving  Goal:  Over the next 30 days, the patient will not experience hospital readmission Continue to work with [facility] nurse and therapy to not have to return to the hospital  Interventions:    Chronic Kidney Disease Interventions: Reviewed medications with patient and discussed importance of compliance    Advised patient, providing education and rationale, to  monitor blood pressure daily and record, calling PCP for findings outside established parameters    Reviewed scheduled/upcoming provider appointments including    Last practice recorded BP readings:  BP Readings from Last 3 Encounters:  01/10/24 (!) 161/39  12/21/23 (!) 176/51  12/12/23 132/74   Most recent eGFR/CrCl:  Lab Results  Component Value Date   EGFR 15 (L) 09/19/2023    No components found for: "CRCL"  Patient Self Care Activities:  Attend all scheduled provider appointments Participate in Transition of Care Program/Attend TOC scheduled calls call office if I gain more than 2 pounds in one day or 5 pounds in one week track weight in diary follow rescue plan if symptoms flare-up  Continue to take rest breaks and contact provide for worsening shortness of breath with weight gain Continue to monitor BP and weight Bowel regimen: taking stool softeners   Plan:  Has scheduled outpatient surgery with Dr. Charlotte Cookey 02/01/24 Telephone follow up appointment with care management team member scheduled for:  02/10/24 at 10 am Follow dietary recommendations for post procedure and increase fiber as instructed, maybe to try some prune juice        [place], RN, BSN, CCM   [facility], [facility] Health RN Care Manager Direct Dial: [phone]

## 2024-01-31 NOTE — Anesthesia Preprocedure Evaluation (Signed)
 Anesthesia Evaluation  Patient identified by MRN, date of birth, ID band Patient awake    Reviewed: Allergy & Precautions, NPO status , Patient's Chart, lab work & pertinent test results  Airway Mallampati: II  TM Distance: >3 FB Neck ROM: Full    Dental  (+) Teeth Intact, Dental Advisory Given   Pulmonary COPD, Current Smoker and Patient abstained from smoking.   breath sounds clear to auscultation       Cardiovascular hypertension, + CAD, + Peripheral Vascular Disease and +CHF   Rhythm:Regular Rate:Normal     Neuro/Psych  Headaches PSYCHIATRIC DISORDERS Anxiety Depression     Neuromuscular disease CVA, No Residual Symptoms    GI/Hepatic Neg liver ROS, hiatal hernia,GERD  ,,  Endo/Other  diabetes    Renal/GU Renal disease     Musculoskeletal  (+) Arthritis ,    Abdominal   Peds  Hematology negative hematology ROS (+)   Anesthesia Other Findings   Reproductive/Obstetrics                             Anesthesia Physical Anesthesia Plan  ASA: 3  Anesthesia Plan: General   Post-op Pain Management: Ofirmev  IV (intra-op)*   Induction: Intravenous  PONV Risk Score and Plan: 3 and Ondansetron , Dexamethasone  and Midazolam   Airway Management Planned: LMA  Additional Equipment: None  Intra-op Plan:   Post-operative Plan: Extubation in OR  Informed Consent: I have reviewed the patients History and Physical, chart, labs and discussed the procedure including the risks, benefits and alternatives for the proposed anesthesia with the patient or authorized representative who has indicated his/her understanding and acceptance.     Dental advisory given  Plan Discussed with: CRNA  Anesthesia Plan Comments: (PAT note written 01/31/2024 by Daemian Gahm, PA-C.  )       Anesthesia Quick Evaluation

## 2024-01-31 NOTE — Patient Instructions (Signed)
 Visit Information  Thank you for taking time to visit with me today. Please don't hesitate to contact me if I can be of assistance to you before our next scheduled telephone appointment.  Our next appointment is by telephone on Feb 10, 2024 at 10:00 am  Following is a copy of your care plan:   Goals Addressed             This Visit's Progress    [facility] Transitions of Care (TOC) Care Plan       Problems:  Recent Hospitalization for treatment of CHF and CKD Stage new regimen Medication management barrier      Home health RN is filling her pill box, diurectic added today   GI: slow with bowels moving  Goal:  Over the next 30 days, the patient will not experience hospital readmission Continue to work with [facility] nurse and therapy to not have to return to the hospital  Interventions:    Chronic Kidney Disease Interventions: Reviewed medications with patient and discussed importance of compliance    Advised patient, providing education and rationale, to monitor blood pressure daily and record, calling PCP for findings outside established parameters    Reviewed scheduled/upcoming provider appointments including    Last practice recorded BP readings:  BP Readings from Last 3 Encounters:  01/10/24 (!) 161/39  12/21/23 (!) 176/51  12/12/23 132/74   Most recent eGFR/CrCl:  Lab Results  Component Value Date   EGFR 15 (L) 09/19/2023    No components found for: "CRCL"  Patient Self Care Activities:  Attend all scheduled provider appointments Participate in Transition of Care Program/Attend TOC scheduled calls call office if I gain more than 2 pounds in one day or 5 pounds in one week track weight in diary follow rescue plan if symptoms flare-up  Continue to take rest breaks and contact provide for worsening shortness of breath with weight gain Continue to monitor BP and weight Bowel regimen: taking stool softeners   Plan:  Has scheduled outpatient surgery with Dr. Charlotte Cookey  02/01/24 Telephone follow up appointment with care management team member scheduled for:  02/10/24 at 10 am Follow dietary recommendations for post procedure and increase fiber as instructed, maybe to try some prune juice        Patient verbalizes understanding of instructions and care plan provided today and agrees to view in MyChart. Active MyChart status and patient understanding of how to access instructions and care plan via MyChart confirmed with patient.     Telephone follow up appointment with care management team member scheduled for: The patient has been provided with contact information for the care management team and has been advised to call with any health related questions or concerns.   Please call the care guide team at [phone] if you need to cancel or reschedule your appointment.   Please call the [place]  National Suicide Prevention Lifeline: 607-026-2375 or TTY: 905-480-2752 TTY 865-394-3856) to talk to a trained counselor if you are experiencing a Mental Health or Behavioral Health Crisis or need someone to talk to. [place], RN, BSN, CCM [facility], [facility] RN Care Manager Direct Dial: [phone]

## 2024-02-01 ENCOUNTER — Ambulatory Visit (HOSPITAL_COMMUNITY)
Admission: RE | Admit: 2024-02-01 | Discharge: 2024-02-01 | Disposition: A | Discharge status: Home / Self Care | Attending: Surgery | Admitting: Surgery

## 2024-02-01 ENCOUNTER — Ambulatory Visit (HOSPITAL_BASED_OUTPATIENT_CLINIC_OR_DEPARTMENT_OTHER): Payer: Self-pay | Admitting: Physician Assistant

## 2024-02-01 ENCOUNTER — Encounter (HOSPITAL_COMMUNITY)
Admission: RE | Disposition: A | Discharge status: Home / Self Care | Payer: Self-pay | Source: Home / Self Care | Attending: Surgery

## 2024-02-01 ENCOUNTER — Other Ambulatory Visit (HOSPITAL_COMMUNITY): Payer: Self-pay

## 2024-02-01 ENCOUNTER — Encounter (HOSPITAL_COMMUNITY): Payer: Self-pay | Admitting: Surgery

## 2024-02-01 ENCOUNTER — Other Ambulatory Visit: Payer: Self-pay

## 2024-02-01 ENCOUNTER — Ambulatory Visit (HOSPITAL_COMMUNITY): Payer: Self-pay | Admitting: Physician Assistant

## 2024-02-01 DIAGNOSIS — N186 End stage renal disease: Secondary | ICD-10-CM

## 2024-02-01 DIAGNOSIS — I132 Hypertensive heart and chronic kidney disease with heart failure and with stage 5 chronic kidney disease, or end stage renal disease: Secondary | ICD-10-CM | POA: Diagnosis not present

## 2024-02-01 DIAGNOSIS — I2510 Atherosclerotic heart disease of native coronary artery without angina pectoris: Secondary | ICD-10-CM | POA: Diagnosis not present

## 2024-02-01 DIAGNOSIS — I509 Heart failure, unspecified: Secondary | ICD-10-CM

## 2024-02-01 DIAGNOSIS — Z8673 Personal history of transient ischemic attack (TIA), and cerebral infarction without residual deficits: Secondary | ICD-10-CM | POA: Diagnosis not present

## 2024-02-01 DIAGNOSIS — T82898A Other specified complication of vascular prosthetic devices, implants and grafts, initial encounter: Secondary | ICD-10-CM

## 2024-02-01 DIAGNOSIS — J439 Emphysema, unspecified: Secondary | ICD-10-CM | POA: Insufficient documentation

## 2024-02-01 DIAGNOSIS — N185 Chronic kidney disease, stage 5: Secondary | ICD-10-CM

## 2024-02-01 DIAGNOSIS — K219 Gastro-esophageal reflux disease without esophagitis: Secondary | ICD-10-CM | POA: Diagnosis not present

## 2024-02-01 DIAGNOSIS — Z794 Long term (current) use of insulin: Secondary | ICD-10-CM | POA: Diagnosis not present

## 2024-02-01 DIAGNOSIS — F17210 Nicotine dependence, cigarettes, uncomplicated: Secondary | ICD-10-CM

## 2024-02-01 DIAGNOSIS — K449 Diaphragmatic hernia without obstruction or gangrene: Secondary | ICD-10-CM | POA: Insufficient documentation

## 2024-02-01 DIAGNOSIS — E1122 Type 2 diabetes mellitus with diabetic chronic kidney disease: Secondary | ICD-10-CM

## 2024-02-01 DIAGNOSIS — I5033 Acute on chronic diastolic (congestive) heart failure: Secondary | ICD-10-CM | POA: Insufficient documentation

## 2024-02-01 DIAGNOSIS — E785 Hyperlipidemia, unspecified: Secondary | ICD-10-CM | POA: Diagnosis not present

## 2024-02-01 DIAGNOSIS — F419 Anxiety disorder, unspecified: Secondary | ICD-10-CM

## 2024-02-01 HISTORY — PX: FISTULA SUPERFICIALIZATION: SHX6341

## 2024-02-01 LAB — POCT I-STAT, CHEM 8
BUN: 74 mg/dL — ABNORMAL HIGH (ref 8–23)
Calcium, Ion: 1 mmol/L — ABNORMAL LOW (ref 1.15–1.40)
Chloride: 92 mmol/L — ABNORMAL LOW (ref 98–111)
Creatinine, Ser: 4.6 mg/dL — ABNORMAL HIGH (ref 0.44–1.00)
Glucose, Bld: 151 mg/dL — ABNORMAL HIGH (ref 70–99)
HCT: 40 % (ref 36.0–46.0)
Hemoglobin: 13.6 g/dL (ref 12.0–15.0)
Potassium: 2.6 mmol/L — CL (ref 3.5–5.1)
Sodium: 138 mmol/L (ref 135–145)
TCO2: 31 mmol/L (ref 22–32)

## 2024-02-01 LAB — GLUCOSE, CAPILLARY
Glucose-Capillary: 165 mg/dL — ABNORMAL HIGH (ref 70–99)
Glucose-Capillary: 135 mg/dL — ABNORMAL HIGH (ref 70–99)
Glucose-Capillary: 104 mg/dL — ABNORMAL HIGH (ref 70–99)

## 2024-02-01 SURGERY — FISTULA SUPERFICIALIZATION
Anesthesia: General | Site: Arm Upper | Laterality: Right

## 2024-02-01 MED ORDER — CHLORHEXIDINE GLUCONATE 0.12 % MT SOLN
15.0000 mL | Freq: Once | OROMUCOSAL | Status: AC
  Administered 2024-02-01: 15 mL via OROMUCOSAL
  Filled 2024-02-01: qty 15

## 2024-02-01 MED ORDER — CHLORHEXIDINE GLUCONATE 4 % EX SOLN: 60.0000 mL | Freq: Once | CUTANEOUS | Status: DC

## 2024-02-01 MED ORDER — PHENYLEPHRINE HCL-NACL 20-0.9 MG/250ML-% IV SOLN
INTRAVENOUS | Status: DC | PRN
  Administered 2024-02-01: 30 ug/min via INTRAVENOUS

## 2024-02-01 MED ORDER — EPHEDRINE 5 MG/ML INJ
INTRAVENOUS | Status: AC
Start: 2024-02-01 — End: ?
  Filled 2024-02-01: qty 5

## 2024-02-01 MED ORDER — ATROPINE SULFATE 0.4 MG/ML IV SOLN
INTRAVENOUS | Status: AC
Start: 2024-02-01 — End: ?
  Filled 2024-02-01: qty 2

## 2024-02-01 MED ORDER — ORAL CARE MOUTH RINSE: 15.0000 mL | Freq: Once | OROMUCOSAL | Status: AC

## 2024-02-01 MED ORDER — LIDOCAINE 2% (20 MG/ML) 5 ML SYRINGE
INTRAMUSCULAR | Status: AC
  Filled 2024-02-01: qty 5

## 2024-02-01 MED ORDER — OXYCODONE-ACETAMINOPHEN 5-325 MG PO TABS
1.0000 | ORAL_TABLET | Freq: Four times a day (QID) | ORAL | 0 refills | Status: DC | PRN
  Filled 2024-02-01: qty 16, 4d supply, fill #0

## 2024-02-01 MED ORDER — ROCURONIUM BROMIDE 10 MG/ML (PF) SYRINGE
PREFILLED_SYRINGE | INTRAVENOUS | Status: DC | PRN
  Administered 2024-02-01: 40 mg via INTRAVENOUS

## 2024-02-01 MED ORDER — ONDANSETRON HCL 4 MG/2ML IJ SOLN
INTRAMUSCULAR | Status: AC
  Filled 2024-02-01: qty 2

## 2024-02-01 MED ORDER — EPHEDRINE SULFATE-NACL 50-0.9 MG/10ML-% IV SOSY
PREFILLED_SYRINGE | INTRAVENOUS | Status: DC | PRN
  Administered 2024-02-01 (×2): 10 mg via INTRAVENOUS
  Administered 2024-02-01: 5 mg via INTRAVENOUS

## 2024-02-01 MED ORDER — LIDOCAINE 2% (20 MG/ML) 5 ML SYRINGE
INTRAMUSCULAR | Status: DC | PRN
  Administered 2024-02-01: 40 mg via INTRAVENOUS

## 2024-02-01 MED ORDER — SUGAMMADEX SODIUM 200 MG/2ML IV SOLN
INTRAVENOUS | Status: DC | PRN
  Administered 2024-02-01: 200 mg via INTRAVENOUS

## 2024-02-01 MED ORDER — OXYCODONE HCL 5 MG/5ML PO SOLN: 5.0000 mg | Freq: Once | ORAL | Status: DC | PRN

## 2024-02-01 MED ORDER — DEXAMETHASONE SODIUM PHOSPHATE 10 MG/ML IJ SOLN
INTRAMUSCULAR | Status: DC | PRN
  Administered 2024-02-01: 8 mg via INTRAVENOUS

## 2024-02-01 MED ORDER — PROPOFOL 10 MG/ML IV BOLUS
INTRAVENOUS | Status: DC | PRN
  Administered 2024-02-01: 120 mg via INTRAVENOUS

## 2024-02-01 MED ORDER — LIDOCAINE-EPINEPHRINE (PF) 1 %-1:200000 IJ SOLN
INTRAMUSCULAR | Status: AC
  Filled 2024-02-01: qty 30

## 2024-02-01 MED ORDER — FENTANYL CITRATE (PF) 250 MCG/5ML IJ SOLN
INTRAMUSCULAR | Status: DC | PRN
Start: 2024-02-01 — End: 2024-02-01
  Administered 2024-02-01: 100 ug via INTRAVENOUS

## 2024-02-01 MED ORDER — 0.9 % SODIUM CHLORIDE (POUR BTL) OPTIME
TOPICAL | Status: DC | PRN
  Administered 2024-02-01: 1000 mL

## 2024-02-01 MED ORDER — PHENYLEPHRINE 80 MCG/ML (10ML) SYRINGE FOR IV PUSH (FOR BLOOD PRESSURE SUPPORT)
PREFILLED_SYRINGE | INTRAVENOUS | Status: DC | PRN
  Administered 2024-02-01: 160 ug via INTRAVENOUS

## 2024-02-01 MED ORDER — FENTANYL CITRATE (PF) 250 MCG/5ML IJ SOLN
INTRAMUSCULAR | Status: AC
  Filled 2024-02-01: qty 5

## 2024-02-01 MED ORDER — ONDANSETRON HCL 4 MG/2ML IJ SOLN
INTRAMUSCULAR | Status: DC | PRN
  Administered 2024-02-01: 4 mg via INTRAVENOUS

## 2024-02-01 MED ORDER — HEPARIN 6000 UNIT IRRIGATION SOLUTION
Status: DC | PRN
  Administered 2024-02-01: 1

## 2024-02-01 MED ORDER — HEPARIN 6000 UNIT IRRIGATION SOLUTION
Status: AC
Start: 2024-02-01 — End: ?
  Filled 2024-02-01: qty 500

## 2024-02-01 MED ORDER — PROPOFOL 10 MG/ML IV BOLUS
INTRAVENOUS | Status: AC
  Filled 2024-02-01: qty 20

## 2024-02-01 MED ORDER — BUSPIRONE HCL 5 MG PO TABS
5.0000 mg | ORAL_TABLET | Freq: Two times a day (BID) | ORAL | Status: AC
Start: 2024-02-01 — End: ?

## 2024-02-01 MED ORDER — OXYCODONE HCL 5 MG PO TABS: 5.0000 mg | ORAL_TABLET | Freq: Once | ORAL | Status: DC | PRN

## 2024-02-01 MED ORDER — SODIUM CHLORIDE 0.9 % IV SOLN: INTRAVENOUS | Status: DC

## 2024-02-01 MED ORDER — VANCOMYCIN HCL IN DEXTROSE 1-5 GM/200ML-% IV SOLN
1000.0000 mg | INTRAVENOUS | Status: AC
  Administered 2024-02-01: 1000 mg via INTRAVENOUS
  Filled 2024-02-01: qty 200

## 2024-02-01 MED ORDER — SODIUM CHLORIDE 0.9% FLUSH: 3.0000 mL | INTRAVENOUS | Status: DC | PRN

## 2024-02-01 MED ORDER — DROPERIDOL 2.5 MG/ML IJ SOLN
0.6250 mg | Freq: Once | INTRAMUSCULAR | Status: DC | PRN
Start: 2024-02-01 — End: 2024-02-01

## 2024-02-01 MED ORDER — DEXAMETHASONE SODIUM PHOSPHATE 10 MG/ML IJ SOLN
INTRAMUSCULAR | Status: AC
Start: 2024-02-01 — End: ?
  Filled 2024-02-01: qty 1

## 2024-02-01 MED ORDER — PHENYLEPHRINE 80 MCG/ML (10ML) SYRINGE FOR IV PUSH (FOR BLOOD PRESSURE SUPPORT)
PREFILLED_SYRINGE | INTRAVENOUS | Status: AC
Start: 2024-02-01 — End: ?
  Filled 2024-02-01: qty 10

## 2024-02-01 MED ORDER — FENTANYL CITRATE (PF) 100 MCG/2ML IJ SOLN: 25.0000 ug | INTRAMUSCULAR | Status: DC | PRN

## 2024-02-01 MED ORDER — ACETAMINOPHEN 10 MG/ML IV SOLN: 1000.0000 mg | Freq: Once | INTRAVENOUS | Status: DC | PRN

## 2024-02-01 MED ORDER — CHLORHEXIDINE GLUCONATE 4 % EX SOLN
60.0000 mL | Freq: Once | CUTANEOUS | Status: DC
Start: 2024-02-01 — End: 2024-02-01

## 2024-02-01 MED ORDER — ROCURONIUM BROMIDE 10 MG/ML (PF) SYRINGE
PREFILLED_SYRINGE | INTRAVENOUS | Status: AC
Start: 2024-02-01 — End: ?
  Filled 2024-02-01: qty 10

## 2024-02-01 SURGICAL SUPPLY — 24 items
ARMBAND PINK RESTRICT EXTREMIT (MISCELLANEOUS) ×2 IMPLANT
BAG COUNTER SPONGE SURGICOUNT (BAG) ×2 IMPLANT
BNDG ELASTIC 4INX 5YD STR LF (GAUZE/BANDAGES/DRESSINGS) IMPLANT
BNDG ELASTIC 6X10 VLCR STRL LF (GAUZE/BANDAGES/DRESSINGS) IMPLANT
CANISTER SUCTION 3000ML PPV (SUCTIONS) ×2 IMPLANT
CLIP TI MEDIUM 6 (CLIP) ×2 IMPLANT
CLIP TI WIDE RED SMALL 6 (CLIP) ×2 IMPLANT
COVER PROBE W GEL 5X96 (DRAPES) ×2 IMPLANT
DERMABOND ADVANCED .7 DNX12 (GAUZE/BANDAGES/DRESSINGS) ×2 IMPLANT
ELECTRODE REM PT RTRN 9FT ADLT (ELECTROSURGICAL) ×2 IMPLANT
GLOVE SURG SS PI 7.5 STRL IVOR (GLOVE) ×6 IMPLANT
GOWN STRL REUS W/ TWL LRG LVL3 (GOWN DISPOSABLE) ×4 IMPLANT
GOWN STRL REUS W/ TWL XL LVL3 (GOWN DISPOSABLE) ×2 IMPLANT
KIT BASIN OR (CUSTOM PROCEDURE TRAY) ×2 IMPLANT
KIT TURNOVER KIT B (KITS) ×2 IMPLANT
NS IRRIG 1000ML POUR BTL (IV SOLUTION) ×2 IMPLANT
PACK CV ACCESS (CUSTOM PROCEDURE TRAY) ×2 IMPLANT
PAD ARMBOARD POSITIONER FOAM (MISCELLANEOUS) ×4 IMPLANT
SUT PROLENE 6 0 CC (SUTURE) ×2 IMPLANT
SUT VIC AB 3-0 SH 27X BRD (SUTURE) ×2 IMPLANT
SUT VIC AB 4-0 PS2 18 (SUTURE) IMPLANT
TOWEL GREEN STERILE (TOWEL DISPOSABLE) ×2 IMPLANT
UNDERPAD 30X36 HEAVY ABSORB (UNDERPADS AND DIAPERS) ×2 IMPLANT
WATER STERILE IRR 1000ML POUR (IV SOLUTION) ×2 IMPLANT

## 2024-02-01 NOTE — Interval H&P Note (Signed)
 History and Physical Interval Note:  02/01/2024 10:57 AM  [name]  has presented today for surgery, with the diagnosis of End Stage Renal Disease.  The various methods of treatment have been discussed with the patient and family. After consideration of risks, benefits and other options for treatment, the patient has consented to  Procedure(s): FISTULA SUPERFICIALIZATION (Right) as a surgical intervention.  The patient's history has been reviewed, patient examined, no change in status, stable for surgery.  I have reviewed the patient's chart and labs.  Questions were answered to the patient's satisfaction.     [name]

## 2024-02-01 NOTE — Op Note (Signed)
    Patient name: [name] MRN: [id number] DOB: Aug 22, 1946 Sex: female  02/01/2024 Pre-operative Diagnosis: CKD Post-operative diagnosis:  Same Surgeon:  [name] Assistants:  Maryanna Smart, PA Procedure:   Revision of right brachiocephalic fistula (elevation and branch ligation) Anesthesia:  General Blood Loss:  minimal Specimens:  none  Findings: Excellent 6 mm vein  Indications: This is a 78 year old female who was previously undergone a right brachiocephalic fistula.  This has matured nicely but is very deep under the skin.  She comes in today for elevation.  Procedure:  The patient was identified in the holding area and taken to [facility] OR ROOM 12  The patient was then placed supine on the table. general anesthesia was administered.  The patient was prepped and draped in the usual sterile fashion.  A time out was called and antibiotics were administered.  A PA was necessary to expedite the procedure and assist with technical details.  She helped with exposure by providing suction and retraction.  She help with the elevation.  She help with wound closure.  Ultrasound was used to evaluate the right cephalic vein.  It was nicely dilated but deep under the skin.  I made 2 longitudinal incisions directly over top of the fistula.  The fistula was circumferentially mobilized from the antecubital crease up to the axilla.  Multiple side branches were ligated between silk ties.  Once the fistula was fully mobilized, I reapproximated the subcutaneous tissue posterior to the fistula with interrupted 3-0 Vicryl suture.  The skin was then closed directly over top of the fistula with a running 4-0 Monocryl suture followed by Dermabond and sterile dressings.  There were no immediate complications.   Disposition: To PACU stable.   Reinaldo Caras, M.D., [facility] [place] Office: [phone] Pager:  [phone]

## 2024-02-01 NOTE — Anesthesia Procedure Notes (Signed)
 Procedure Name: Intubation Date/Time: 02/01/2024 1:12 PM  Performed by: Raylene Calamity, CRNAPre-anesthesia Checklist: Patient identified, Emergency Drugs available, Suction available and Patient being monitored Patient Re-evaluated:Patient Re-evaluated prior to induction Oxygen Delivery Method: Circle System Utilized Preoxygenation: Pre-oxygenation with 100% oxygen Induction Type: IV induction Ventilation: Mask ventilation without difficulty Laryngoscope Size: Mac and 3 Grade View: Grade II Tube type: Oral Number of attempts: 1 Airway Equipment and Method: Stylet and Oral airway Placement Confirmation: ETT inserted through vocal cords under direct vision, positive ETCO2 and breath sounds checked- equal and bilateral Secured at: 21 cm Tube secured with: Tape Dental Injury: Teeth and Oropharynx as per pre-operative assessment

## 2024-02-01 NOTE — Transfer of Care (Signed)
 Immediate Anesthesia Transfer of Care Note  Patient: [name]  Procedure(s) Performed: RIGHT AV FISTULA SUPERFICIALIZATION (Right: Arm Upper)  Patient Location: PACU  Anesthesia Type:General  Level of Consciousness: awake, alert , and patient cooperative  Airway & Oxygen Therapy: Patient Spontanous Breathing and Patient connected to face mask oxygen  Post-op Assessment: Report given to RN and Post -op Vital signs reviewed and stable  Post vital signs: Reviewed and stable  Last Vitals:  Vitals Value Taken Time  BP 129/59 02/01/24 1430  Temp    Pulse 55 02/01/24 1435  Resp 17 02/01/24 1435  SpO2 90 % 02/01/24 1435  Vitals shown include unfiled device data.  Last Pain:  Vitals:   02/01/24 0924  TempSrc:   PainSc: 0-No pain         Complications: No notable events documented.

## 2024-02-01 NOTE — Discharge Instructions (Addendum)
 [facility]  Discharge Instructions  AV Fistula or Graft Surgery for Dialysis Access  Please refer to the following instructions for your post-procedure care. Your surgeon or physician assistant will discuss any changes with you.  Activity  You may drive the day following your surgery, if you are comfortable and no longer taking prescription pain medication. Resume full activity as the soreness in your incision resolves.  Bathing/Showering  You may shower after you go home. Keep your incision dry for 48 hours. Do not soak in a bathtub, hot tub, or swim until the incision heals completely. You may not shower if you have a hemodialysis catheter.  Incision Care  Clean your incision with mild soap and water after 48 hours. Pat the area dry with a clean towel. You do not need a bandage unless otherwise instructed. Do not apply any ointments or creams to your incision. You may have skin glue on your incision. Do not peel it off. It will come off on its own in about one week. Your arm may swell a bit after surgery. To reduce swelling use pillows to elevate your arm so it is above your heart. Your doctor will tell you if you need to lightly wrap your arm with an ACE bandage.   Leave the ace bandage in place for 48 hours.  You can then remove and clean with soap and water daily.    Diet  Resume your normal diet. There are not special food restrictions following this procedure. In order to heal from your surgery, it is CRITICAL to get adequate nutrition. Your body requires vitamins, minerals, and protein. Vegetables are the best source of vitamins and minerals. Vegetables also provide the perfect balance of protein. Processed food has little nutritional value, so try to avoid this.  Medications  Resume taking all of your medications. If your incision is causing pain, you may take over-the counter pain relievers such as acetaminophen  (Tylenol ). If you were  prescribed a stronger pain medication, please be aware these medications can cause nausea and constipation. Prevent nausea by taking the medication with a snack or meal. Avoid constipation by drinking plenty of fluids and eating foods with high amount of fiber, such as fruits, vegetables, and grains.  Do not take Tylenol  if you are taking prescription pain medications.  Follow up Your surgeon may want to see you in the office following your access surgery. If so, this will be arranged at the time of your surgery.  Please call us  immediately for any of the following conditions:  Increased pain, redness, drainage (pus) from your incision site Fever of 101 degrees or higher Severe or worsening pain at your incision site Hand pain or numbness.  Reduce your risk of vascular disease:  Stop smoking. If you would like help, call [facility] at 1-800-QUIT-NOW (438-119-9948) or [place] at 4327575029  Manage your cholesterol Maintain a desired weight Control your diabetes Keep your blood pressure down  Dialysis  It will take several weeks to several months for your new dialysis access to be ready for use. Your surgeon will determine when it is okay to use it. Your nephrologist will continue to direct your dialysis. You can continue to use your Permcath until your new access is ready for use.   02/01/2024 [name] [id number] [birth year]  Surgeon(s): Margherita Shell, MD  Procedure(s): RIGHT AV FISTULA SUPERFICIALIZATION  x Do not stick fistula for 8 weeks    If you have any  questions, please call the office at [phone].

## 2024-02-01 NOTE — Anesthesia Postprocedure Evaluation (Signed)
 Anesthesia Post Note  Patient: ULYANA HARDBARGER  Procedure(s) Performed: RIGHT AV FISTULA SUPERFICIALIZATION (Right: Arm Upper)     Patient location during evaluation: PACU Anesthesia Type: General Level of consciousness: awake and alert Pain management: pain level controlled Vital Signs Assessment: post-procedure vital signs reviewed and stable Respiratory status: spontaneous breathing, nonlabored ventilation, respiratory function stable and patient connected to nasal cannula oxygen Cardiovascular status: blood pressure returned to baseline and stable Postop Assessment: no apparent nausea or vomiting Anesthetic complications: no  There were no known notable events for this encounter.  Last Vitals:  Vitals:   02/01/24 1530 02/01/24 1545  BP: 118/63 116/61  Pulse: (!) 55 (!) 56  Resp: 17 17  Temp:  36.7 C  SpO2: 94% 96%    Last Pain:  Vitals:   02/01/24 0924  TempSrc:   PainSc: 0-No pain                 Willian Harrow

## 2024-02-02 ENCOUNTER — Encounter (HOSPITAL_COMMUNITY): Payer: Self-pay | Admitting: Surgery

## 2024-02-02 ENCOUNTER — Telehealth: Payer: Self-pay

## 2024-02-02 DIAGNOSIS — I132 Hypertensive heart and chronic kidney disease with heart failure and with stage 5 chronic kidney disease, or end stage renal disease: Secondary | ICD-10-CM | POA: Diagnosis not present

## 2024-02-02 DIAGNOSIS — I5033 Acute on chronic diastolic (congestive) heart failure: Secondary | ICD-10-CM | POA: Diagnosis not present

## 2024-02-02 DIAGNOSIS — N185 Chronic kidney disease, stage 5: Secondary | ICD-10-CM | POA: Diagnosis not present

## 2024-02-02 DIAGNOSIS — I1A0 Resistant hypertension: Secondary | ICD-10-CM | POA: Diagnosis not present

## 2024-02-02 DIAGNOSIS — E1122 Type 2 diabetes mellitus with diabetic chronic kidney disease: Secondary | ICD-10-CM | POA: Diagnosis not present

## 2024-02-02 DIAGNOSIS — J439 Emphysema, unspecified: Secondary | ICD-10-CM | POA: Diagnosis not present

## 2024-02-02 NOTE — Telephone Encounter (Signed)
 Communication  Reason for CRM: Received call from [facility], per [facility] Ph: [phone]   Would like a verbal for social work . Housing status help. Please advise [facility]

## 2024-02-03 DIAGNOSIS — I770 Arteriovenous fistula, acquired: Secondary | ICD-10-CM | POA: Diagnosis not present

## 2024-02-03 DIAGNOSIS — E785 Hyperlipidemia, unspecified: Secondary | ICD-10-CM | POA: Diagnosis not present

## 2024-02-03 DIAGNOSIS — E1122 Type 2 diabetes mellitus with diabetic chronic kidney disease: Secondary | ICD-10-CM | POA: Diagnosis not present

## 2024-02-03 DIAGNOSIS — I1A0 Resistant hypertension: Secondary | ICD-10-CM | POA: Diagnosis not present

## 2024-02-03 DIAGNOSIS — N185 Chronic kidney disease, stage 5: Secondary | ICD-10-CM | POA: Diagnosis not present

## 2024-02-03 DIAGNOSIS — N2581 Secondary hyperparathyroidism of renal origin: Secondary | ICD-10-CM | POA: Diagnosis not present

## 2024-02-03 DIAGNOSIS — J439 Emphysema, unspecified: Secondary | ICD-10-CM | POA: Diagnosis not present

## 2024-02-03 DIAGNOSIS — I5030 Unspecified diastolic (congestive) heart failure: Secondary | ICD-10-CM | POA: Diagnosis not present

## 2024-02-03 DIAGNOSIS — I5033 Acute on chronic diastolic (congestive) heart failure: Secondary | ICD-10-CM | POA: Diagnosis not present

## 2024-02-03 DIAGNOSIS — I132 Hypertensive heart and chronic kidney disease with heart failure and with stage 5 chronic kidney disease, or end stage renal disease: Secondary | ICD-10-CM | POA: Diagnosis not present

## 2024-02-03 DIAGNOSIS — D631 Anemia in chronic kidney disease: Secondary | ICD-10-CM | POA: Diagnosis not present

## 2024-02-03 DIAGNOSIS — N184 Chronic kidney disease, stage 4 (severe): Secondary | ICD-10-CM | POA: Diagnosis not present

## 2024-02-03 DIAGNOSIS — I129 Hypertensive chronic kidney disease with stage 1 through stage 4 chronic kidney disease, or unspecified chronic kidney disease: Secondary | ICD-10-CM | POA: Diagnosis not present

## 2024-02-03 NOTE — Telephone Encounter (Signed)
Advised [facility] 

## 2024-02-06 ENCOUNTER — Other Ambulatory Visit: Payer: Medicare Other

## 2024-02-07 ENCOUNTER — Ambulatory Visit: Attending: Nurse Practitioner | Admitting: Nurse Practitioner

## 2024-02-07 ENCOUNTER — Encounter: Payer: Self-pay | Admitting: Nurse Practitioner

## 2024-02-07 VITALS — BP 136/58 | HR 58 | Ht 66.0 in | Wt 183.0 lb

## 2024-02-07 DIAGNOSIS — I700 Atherosclerosis of aorta: Secondary | ICD-10-CM

## 2024-02-07 DIAGNOSIS — E118 Type 2 diabetes mellitus with unspecified complications: Secondary | ICD-10-CM | POA: Insufficient documentation

## 2024-02-07 DIAGNOSIS — R002 Palpitations: Secondary | ICD-10-CM | POA: Diagnosis not present

## 2024-02-07 DIAGNOSIS — E785 Hyperlipidemia, unspecified: Secondary | ICD-10-CM | POA: Diagnosis not present

## 2024-02-07 DIAGNOSIS — Z794 Long term (current) use of insulin: Secondary | ICD-10-CM

## 2024-02-07 DIAGNOSIS — I6523 Occlusion and stenosis of bilateral carotid arteries: Secondary | ICD-10-CM

## 2024-02-07 DIAGNOSIS — I10 Essential (primary) hypertension: Secondary | ICD-10-CM

## 2024-02-07 DIAGNOSIS — Z720 Tobacco use: Secondary | ICD-10-CM

## 2024-02-07 DIAGNOSIS — I739 Peripheral vascular disease, unspecified: Secondary | ICD-10-CM | POA: Diagnosis not present

## 2024-02-07 DIAGNOSIS — I70213 Atherosclerosis of native arteries of extremities with intermittent claudication, bilateral legs: Secondary | ICD-10-CM | POA: Diagnosis not present

## 2024-02-07 DIAGNOSIS — N186 End stage renal disease: Secondary | ICD-10-CM | POA: Diagnosis not present

## 2024-02-07 DIAGNOSIS — R0602 Shortness of breath: Secondary | ICD-10-CM

## 2024-02-07 MED ORDER — METOPROLOL TARTRATE 25 MG PO TABS: 25.0000 mg | ORAL_TABLET | Freq: Two times a day (BID) | ORAL | 3 refills | Status: AC

## 2024-02-07 NOTE — Progress Notes (Signed)
 Office Visit    Patient Name: [name] Date of Encounter: 02/07/2024  Primary Care Provider:  Paseda, Folashade R, FNP Primary Cardiologist:  Eilleen Grates, MD  Chief Complaint   78 year old female with a history of coronary artery calcification noted on CT, aortic atherosclerosis, chronic diastolic heart failure, PSVT, hypertension, hyperlipidemia, PVD, carotid artery stenosis, brain aneurysm, CVA, type 2 diabetes, ESRD, COPD, pulmonary nodules, tobacco use, and anxiety who presents for hospital follow-up related to  heart failure and hypertension.   Past Medical History    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,  "small"   Anxiety    Arthritis    Cataract    bilateral - surgery   CKD (chronic kidney disease), stage IV (HCC)    followed by [facility]   Colon polyps    Depression    Diabetes mellitus 1998   type 2   Diverticulosis    Emphysema of lung (HCC)    "no one has evry told me that"   ESOPHAGEAL STRICTURE 08/27/2008   Family history of adverse reaction to anesthesia    daughter n/v   GERD (gastroesophageal reflux disease)    Glaucoma    bilateral   Heart failure (HCC) 01/04/2024   Hemorrhoids    hx   Hiatal hernia    Hyperlipidemia    Hypertension 2000   Migraines    Neuropathy    fingers and toes   Peripheral vascular disease (HCC)    Phlebitis    30 years ago  left leg   Stroke [facility])    multiple mini strokes ( brain aneurysm ).  Right side weaker. 2 strokes   Past Surgical History:  Procedure Laterality Date   ABDOMINAL AORTAGRAM N/A 01/04/2012   Procedure: ABDOMINAL AORTAGRAM;  Surgeon: Margherita Shell, MD;  Location: [facility] CATH LAB;  Service: Cardiovascular;  Laterality: N/A;   ABDOMINAL AORTAGRAM N/A 08/15/2012   Procedure: ABDOMINAL Tommi Fraise;  Surgeon: Margherita Shell, MD;  Location: [facility] CATH LAB;  Service: Cardiovascular;   Laterality: N/A;   ABDOMINAL AORTOGRAM W/LOWER EXTREMITY Left 11/17/2021   Procedure: ABDOMINAL AORTOGRAM W/LOWER EXTREMITY;  Surgeon: Margherita Shell, MD;  Location: [facility] INVASIVE CV LAB;  Service: Cardiovascular;  Laterality: Left;   ABDOMINAL AORTOGRAM W/LOWER EXTREMITY N/A 03/09/2022   Procedure: ABDOMINAL AORTOGRAM W/LOWER EXTREMITY;  Surgeon: Margherita Shell, MD;  Location: [facility] INVASIVE CV LAB;  Service: Cardiovascular;  Laterality: N/A;   ABDOMINAL AORTOGRAM W/LOWER EXTREMITY N/A 03/15/2023   Procedure: ABDOMINAL AORTOGRAM W/LOWER EXTREMITY;  Surgeon: Margherita Shell, MD;  Location: [facility] INVASIVE CV LAB;  Service: Cardiovascular;  Laterality: N/A;   ABDOMINAL HYSTERECTOMY  1984   AV FISTULA PLACEMENT Right 12/21/2023   Procedure: RIGHT ARM BRACHIOCEPHALIC ARTERIOVENOUS (AV) FISTULA CREATION;  Surgeon: Margherita Shell, MD;  Location: [facility] OR;  Service: Vascular;  Laterality: Right;   BREAST SURGERY     cataract surgery Right 10/22/2015   cataract surgery Left 11/05/2015   CESAREAN SECTION     CHOLECYSTECTOMY     COLONOSCOPY  07/2010   DENTAL SURGERY  05/05/2012   left lower    ESOPHAGOGASTRODUODENOSCOPY  07/2010   EYE SURGERY  07/2013   Laser-Glaucoma   FEMORAL ARTERY STENT  05/11/2011   Left superficial femoral and popliteal artery   FEMORAL-POPLITEAL BYPASS GRAFT Left 11/18/2021   Procedure: LEFT FEMORAL-POPLITEAL ARTERY BYPASS GRAFT;  Surgeon: Margherita Shell, MD;  Location: [facility] OR;  Service: Vascular;  Laterality: Left;   FISTULA SUPERFICIALIZATION Right 02/01/2024   Procedure: RIGHT AV FISTULA SUPERFICIALIZATION;  Surgeon: Margherita Shell, MD;  Location: [facility] OR;  Service: Vascular;  Laterality: Right;   HERNIA REPAIR     times two   KNEE SURGERY     LOWER EXTREMITY ANGIOGRAM Left 08/15/2012   Procedure: LOWER EXTREMITY ANGIOGRAM;  Surgeon: Margherita Shell, MD;  Location: [facility] CATH LAB;  Service: Cardiovascular;  Laterality: Left;  lt leg angio   PERIPHERAL VASCULAR BALLOON  ANGIOPLASTY Left 03/09/2022   Procedure: PERIPHERAL VASCULAR BALLOON ANGIOPLASTY;  Surgeon: Margherita Shell, MD;  Location: [facility] INVASIVE CV LAB;  Service: Cardiovascular;  Laterality: Left;   PERIPHERAL VASCULAR BALLOON ANGIOPLASTY Left 03/15/2023   Procedure: PERIPHERAL VASCULAR BALLOON ANGIOPLASTY;  Surgeon: Margherita Shell, MD;  Location: [facility] INVASIVE CV LAB;  Service: Cardiovascular;  Laterality: Left;   rotator cuff surgery     THYROID  SURGERY      Allergies  Allergies  Allergen Reactions   Ciprofloxacin  Swelling   Ozempic  (0.25 Or 0.5 Mg-Dose) [Semaglutide (0.25 Or 0.5mg -Dos)] Nausea And Vomiting   Plavix [Clopidogrel Bisulfate] Palpitations   Amoxicillin Itching and Swelling    FACE & EYES SWELL   Ace Inhibitors Cough    Sore throat   Atorvastatin      myalgias   Lyrica [Pregabalin] Itching and Nausea Only     gain weight   Penicillins Other (See Comments)    Pt unsure if there is an allergic reaction      Labs/Other Studies Reviewed    The following studies were reviewed today:  Cardiac Studies & Procedures   ______________________________________________________________________________________________   STRESS TESTS  MYOCARDIAL PERFUSION IMAGING 11/23/2018  Narrative  Nuclear stress EF: 56%.  The left ventricular ejection fraction is normal (55-65%).  There was no ST segment deviation noted during stress.  The study is normal.  This is a low risk study.  Normal stress nuclear study with no ischemia or infarction.  Gated ejection fraction 56% with normal wall motion.   ECHOCARDIOGRAM  ECHOCARDIOGRAM COMPLETE 01/04/2024  Narrative ECHOCARDIOGRAM REPORT    Patient Name:   [name] Date of Exam: 01/04/2024 Medical Rec #:  [id number]      Height:       66.0 in Accession #:    [id number]     Weight:       197.0 lb Date of Birth:  Dec 19, 1945       BSA:          1.987 m Patient Age:    78 years       BP:           205/91 mmHg Patient Gender: F               HR:           66 bpm. Exam Location:  Inpatient  Procedure: 2D Echo, Cardiac Doppler and Color Doppler (Both Spectral and Color Flow Doppler were utilized during procedure).  Indications:    CHF  History:        Patient has no prior history of Echocardiogram examinations. CHF, PAD and Stroke; Risk Factors:Hypertension and Diabetes.  Sonographer:    Sulema Endo RDCS, FE, PE Referring Phys: [id number] VASUNDHRA RATHORE  IMPRESSIONS   1. Left ventricular ejection fraction, by estimation, is 60 to 65%. The left ventricle has normal function. The left ventricle has no regional wall motion abnormalities.  There is mild concentric left ventricular hypertrophy. Left ventricular diastolic parameters are consistent with Grade I diastolic dysfunction (impaired relaxation). 2. Right ventricular systolic function is normal. The right ventricular size is normal. 3. The mitral valve is normal in structure. Trivial mitral valve regurgitation. No evidence of mitral stenosis. 4. The aortic valve is tricuspid. Aortic valve regurgitation is not visualized. Aortic valve sclerosis is present, with no evidence of aortic valve stenosis. 5. The inferior vena cava is normal in size with greater than 50% respiratory variability, suggesting right atrial pressure of 3 mmHg.  FINDINGS Left Ventricle: Left ventricular ejection fraction, by estimation, is 60 to 65%. The left ventricle has normal function. The left ventricle has no regional wall motion abnormalities. The left ventricular internal cavity size was normal in size. There is mild concentric left ventricular hypertrophy. Left ventricular diastolic parameters are consistent with Grade I diastolic dysfunction (impaired relaxation).  Right Ventricle: The right ventricular size is normal. No increase in right ventricular wall thickness. Right ventricular systolic function is normal.  Left Atrium: Left atrial size was normal in size.  Right Atrium: Right  atrial size was normal in size.  Pericardium: There is no evidence of pericardial effusion.  Mitral Valve: The mitral valve is normal in structure. Mild mitral annular calcification. Trivial mitral valve regurgitation. No evidence of mitral valve stenosis.  Tricuspid Valve: The tricuspid valve is normal in structure. Tricuspid valve regurgitation is trivial. No evidence of tricuspid stenosis.  Aortic Valve: The aortic valve is tricuspid. Aortic valve regurgitation is not visualized. Aortic valve sclerosis is present, with no evidence of aortic valve stenosis. Aortic valve mean gradient measures 3.0 mmHg. Aortic valve peak gradient measures 6.1 mmHg. Aortic valve area, by VTI measures 3.62 cm.  Pulmonic Valve: The pulmonic valve was normal in structure. Pulmonic valve regurgitation is not visualized. No evidence of pulmonic stenosis.  Aorta: The aortic root is normal in size and structure.  Venous: The inferior vena cava is normal in size with greater than 50% respiratory variability, suggesting right atrial pressure of 3 mmHg.  IAS/Shunts: No atrial level shunt detected by color flow Doppler.   LEFT VENTRICLE PLAX 2D LVIDd:         4.60 cm   Diastology LVIDs:         3.10 cm   LV e' medial:    6.22 cm/s LV PW:         1.20 cm   LV E/e' medial:  12.5 LV IVS:        1.20 cm   LV e' lateral:   7.93 cm/s LVOT diam:     2.30 cm   LV E/e' lateral: 9.8 LV SV:         109 LV SV Index:   55 LVOT Area:     4.15 cm   RIGHT VENTRICLE RV S prime:     12.40 cm/s TAPSE (M-mode): 1.6 cm  LEFT ATRIUM             Index        RIGHT ATRIUM           Index LA diam:        4.00 cm 2.01 cm/m   RA Area:     13.40 cm LA Vol (A2C):   53.2 ml 26.77 ml/m  RA Volume:   33.70 ml  16.96 ml/m LA Vol (A4C):   39.4 ml 19.83 ml/m LA Biplane Vol: 48.6 ml 24.46 ml/m AORTIC VALVE AV Area (Vmax):  3.82 cm AV Area (Vmean):   3.72 cm AV Area (VTI):     3.62 cm AV Vmax:           123.00 cm/s AV  Vmean:          82.500 cm/s AV VTI:            0.301 m AV Peak Grad:      6.1 mmHg AV Mean Grad:      3.0 mmHg LVOT Vmax:         113.00 cm/s LVOT Vmean:        73.900 cm/s LVOT VTI:          0.262 m LVOT/AV VTI ratio: 0.87  AORTA Ao Root diam: 3.00 cm Ao Asc diam:  2.80 cm  MITRAL VALVE                TRICUSPID VALVE MV Area (PHT): 2.87 cm     TR Peak grad:   5.1 mmHg MV Decel Time: 264 msec     TR Vmax:        113.00 cm/s MV E velocity: 77.80 cm/s MV A velocity: 126.00 cm/s  SHUNTS MV E/A ratio:  0.62         Systemic VTI:  0.26 m Systemic Diam: 2.30 cm  Arta Lark Electronically signed by Arta Lark Signature Date/Time: 01/04/2024/4:38:54 PM    Final    MONITORS  LONG TERM MONITOR (3-14 DAYS) 04/25/2023  Narrative Predominant rhythm was normal sinus Infrequent runs of non sustained ventricular tachycardia with the longest run being 7 beats Occasional runs of supraventricular tachycardia longest being 16 beats. No sustained arrhythmias       ______________________________________________________________________________________________     Recent Labs: 08/08/2023: TSH 2.44 01/03/2024: ALT 18; B Natriuretic Peptide 518.1; Platelets 247 01/05/2024: Magnesium  1.9 02/01/2024: BUN 74; Creatinine, Ser 4.60; Hemoglobin 13.6; Potassium 2.6; Sodium 138  Recent Lipid Panel    Component Value Date/Time   CHOL 249 (H) 08/08/2023 1506   TRIG 129 08/08/2023 1506   HDL 57 08/08/2023 1506   CHOLHDL 4.4 08/08/2023 1506   VLDL 25.8 01/22/2022 0828   LDLCALC 166 (H) 08/08/2023 1506   LDLDIRECT 85 01/23/2024 1431   LDLDIRECT 146.4 01/30/2010 1056    History of Present Illness   77 year old female with the above past medical history including coronary artery calcification noted on CT, aortic atherosclerosis, chronic diastolic heart failure, PSVT, hypertension, hyperlipidemia, PVD, carotid artery stenosis, brain aneurysm, CVA, type 2 diabetes, ESRD, COPD, pulmonary  nodules, tobacco use, and anxiety.  She has evidence of coronary artery calcification, aortic atherosclerosis on prior CTs.  Lexiscan  Myoview  in 2020 was negative for ischemia.  Cardiac monitor in May 2021 showed rare runs of SVT, no sustained arrhythmia.  She was hospitalized in December 2023 in the setting of hypertensive urgency, chest pain, shortness of breath.  She also noted dizziness, blurred vision.  MRI showed no acute abnormalities.  Troponin was mildly elevated with flat trend.  Echocardiogram at the time showed EF 60 to 65%, normal LV function, no RWMA, moderate LVH, G1 DD, normal RV, aortic sclerosis without evidence of stenosis. Additionally, she has a history of peripheral vascular disease s/p left superficial femoral artery stenting in 2012 with early ISR, s/p balloon angioplasty which ultimately failed.  She follows with vascular surgery.  She underwent left femoral to below-knee popliteal artery bypass graft with vein in March 2023.  She developed distal anastomotic stenosis which required intervention on 03/09/2022.  She underwent  drug-coated balloon angioplasty to left femoral-popliteal bypass graft in June 2024.  She was last seen in the office on 03/31/2023 and had several concerns including elevated BP, shortness of breath at rest and with exertion palpitations, intermittent chest tightness, as well as noncardiac symptoms including tremors, involuntary movements, headaches, blurred vision, itching, swelling in her hands, face, and legs, as well as leg cramps.  She was started on low-dose carvedilol  and hydralazine .  She was referred to hypertension clinic Pharm.D.  Ischemic evaluation was deferred.  She underwent right brachiocephalic fistula placement in early April 2025 in preparation for hemodialysis.  She was hospitalized in April 2025 in the setting of of resistant hypertension, acute on chronic diastolic heart failure.  Troponin was elevated.  Cardiology was consulted.  She was diuresed  with IV Lasix .  Echocardiogram showed EF 60 to 65%, mild LVH, normal RV function, no significant valvular abnormalities.  Ischemic evaluation was deferred in the setting of CKD, patient's wish to avoid dialysis. She was discharged home in stable condition.  She underwent revision of right brachiocephalic fistula (elevation and branch ligation) on 02/01/2024 per vascular surgery.  She presents today for follow-up accompanied by her husband. Since her hospitalization she has been stable from a cardiac standpoint, she does continue to note shortness of breath with minimal exertion, intermittent chest heaviness as well as intermittent palpitations.  She went to refill her carvedilol  and her pharmacist told her that she should under no circumstances take carvedilol  with Trelegy as this could contribute to her shortness of breath.  She immediately stop taking her carvedilol .  She is also concerned that hydralazine  may causing worsening palpitations.  He reports that from a fluid standpoint, she has been stable, but overall, she feels poorly.  Home Medications    Current Outpatient Medications  Medication Sig Dispense Refill   acetaminophen  (TYLENOL ) 500 MG tablet Take 1,000 mg by mouth 2 (two) times daily as needed for mild pain (pain score 1-3).     amLODipine  (NORVASC ) 10 MG tablet Take 1 tablet (10 mg total) by mouth daily. 30 tablet 0   aspirin  EC 81 MG tablet Take 1 tablet (81 mg total) by mouth daily. Swallow whole. 30 tablet 12   busPIRone  (BUSPAR ) 5 MG tablet Take 1 tablet (5 mg total) by mouth 2 (two) times daily.     calcitRIOL  (ROCALTROL ) 0.25 MCG capsule Take 0.25 mcg by mouth in the morning.     cloNIDine  (CATAPRES ) 0.1 MG tablet Take 1 tablet (0.1 mg total) by mouth at bedtime. 60 tablet 11   Fluticasone -Umeclidin-Vilant (TRELEGY ELLIPTA ) 200-62.5-25 MCG/ACT AEPB Inhale 1 puff into the lungs daily. 3 each 3   furosemide  (LASIX ) 40 MG tablet Take 80 mg by mouth 2 (two) times daily.      hydrALAZINE  (APRESOLINE ) 100 MG tablet Take 1 tablet (100 mg total) by mouth every 8 (eight) hours. 90 tablet 0   hydrOXYzine  (ATARAX ) 25 MG tablet Take 1 tablet (25 mg total) by mouth 3 (three) times daily as needed for anxiety. 30 tablet 0   insulin  aspart (NOVOLOG  FLEXPEN) 100 UNIT/ML FlexPen Inject 10-14 Units into the skin 3 (three) times daily before meals. (Patient taking differently: Inject 8-14 Units into the skin 3 (three) times daily as needed for high blood sugar (sliding scale).) 45 mL 3   insulin  degludec (TRESIBA  FLEXTOUCH) 200 UNIT/ML FlexTouch Pen Inject 40 Units into the skin daily. 18 mL 3   isosorbide  mononitrate (IMDUR ) 60 MG 24 hr tablet Take 1 tablet (  60 mg total) by mouth daily. 30 tablet 0   meclizine  (ANTIVERT ) 25 MG tablet 1/2 tab up to 3 times daily for motion sickness/dizziness. (Patient taking differently: Take 25 mg by mouth 3 (three) times daily as needed for dizziness.) 30 tablet 0   metolazone (ZAROXOLYN) 5 MG tablet Take 5 mg by mouth every other day.     metoprolol  tartrate (LOPRESSOR ) 25 MG tablet Take 1 tablet (25 mg total) by mouth 2 (two) times daily. 180 tablet 3   Omega-3 Fatty Acids (FISH OIL) 1000 MG CAPS Take 1,000 mg by mouth in the morning and at bedtime.     rOPINIRole  (REQUIP ) 0.25 MG tablet Take 0.25 mg by mouth at bedtime.     rosuvastatin  (CRESTOR ) 40 MG tablet Take 1 tablet (40 mg total) by mouth daily. 90 tablet 1   oxyCODONE -acetaminophen  (PERCOCET) 5-325 MG tablet Take 1 tablet by mouth every 6 (six) hours as needed. (Patient not taking: Reported on 02/07/2024) 16 tablet 0   No current facility-administered medications for this visit.     Review of Systems    She denies pnd, orthopnea, n, v, dizziness, syncope, edema, weight gain, or early satiety. All other systems reviewed and are otherwise negative except as noted above.   Physical Exam    VS:  BP (!) 136/58 (BP Location: Left Arm, Patient Position: Sitting, Cuff Size: Normal)   Pulse  (!) 58   Ht 5\' 6"  (1.676 m)   Wt 183 lb (83 kg)   SpO2 96%   BMI 29.54 kg/m   GEN: Well nourished, well developed, in no acute distress. HEENT: normal. Neck: Supple, no JVD, carotid bruits, or masses. Cardiac: RRR, no murmurs, rubs, or gallops. No clubbing, cyanosis, edema.  Radials/DP/PT 2+ and equal bilaterally.  Respiratory:  Respirations regular and unlabored, clear to auscultation bilaterally. GI: Soft, nontender, nondistended, BS + x 4. MS: no deformity or atrophy. Skin: warm and dry, no rash. Neuro:  Strength and sensation are intact. Psych: Normal affect.  Accessory Clinical Findings    ECG personally reviewed by me today - EKG Interpretation Date/Time:  Tuesday Feb 07 2024 10:25:16 EDT Ventricular Rate:  58 PR Interval:  130 QRS Duration:  110 QT Interval:  494 QTC Calculation: 484 R Axis:   29  Text Interpretation: Sinus bradycardia Possible Left atrial enlargement Nonspecific ST and T wave abnormality When compared with ECG of 04-Jan-2024 05:05, PREVIOUS ECG IS PRESENT Confirmed by Marlana Silvan ([id number]) on 02/07/2024 10:26:53 AM  - no acute changes.   Lab Results  Component Value Date   WBC 10.8 (H) 01/03/2024   HGB 13.6 02/01/2024   HCT 40.0 02/01/2024   MCV 99.8 01/03/2024   PLT 247 01/03/2024   Lab Results  Component Value Date   CREATININE 4.60 (H) 02/01/2024   BUN 74 (H) 02/01/2024   NA 138 02/01/2024   K 2.6 (LL) 02/01/2024   CL 92 (L) 02/01/2024   CO2 23 01/10/2024   Lab Results  Component Value Date   ALT 18 01/03/2024   AST 29 01/03/2024   ALKPHOS 71 01/03/2024   BILITOT 0.5 01/03/2024   Lab Results  Component Value Date   CHOL 249 (H) 08/08/2023   HDL 57 08/08/2023   LDLCALC 166 (H) 08/08/2023   LDLDIRECT 85 01/23/2024   TRIG 129 08/08/2023   CHOLHDL 4.4 08/08/2023    Lab Results  Component Value Date   HGBA1C 7.3 (A) 10/31/2023    Assessment & Plan  1. Shortness of breath/Coronary artery calcification on CT/chronic diastolic  heart failure: She was hospitalized in April 2025 in the setting of of resistant hypertension, acute on chronic diastolic heart failure.  Troponin was elevated. Echocardiogram showed EF 60 to 65%, mild LVH, normal RV function, no significant valvular abnormalities.  Ischemic evaluation was deferred in the setting of CKD, patient's wish to avoid dialysis. She continues to note shortness of breath both at rest and with exertion, intermittent chest tightness.  She went to refill her carvedilol  and her pharmacist told her that she should under no circumstances take carvedilol  with Trelegy as this could contribute to her shortness of breath. Reviewed medications with PharmD.  Will stop carvedilol  due to likely drug-drug interaction with Trelegy. Will start metoprolol  25 mg twice daily (cardioselective).  Continue to monitor symptoms, BP/HR.  We discussed possible ischemic evaluation, she wishes to avoid cardiac catheterization due to risk for AKI on ESRD as she is currently awaiting dialysis.  Euvolemic and well compensated on exam.  Fluid volume management per nephrology.  Reviewed ED precautions. Continue aspirin , amlodipine , clonidine , hydralazine , Imdur , Lasix  and Crestor .    2.  Hypertension: She has had difficult to control BP, limited in the setting of ESRD, has followed with hypertension clinic Pharm.D.   BP currently well controlled. Continue current antihypertensive regimen. Continue to monitor BP with transition from metoprolol  as above.   3. Palpitations/PSVT: Cardiac monitor in May 2021 showed rare runs of SVT, no sustained arrhythmia.  Cardiac monitor in 04/2023 revealed predominantly sinus rhythm, infrequent NSVT, longest run lasting 7 beats, occasional SVT, longest run lasting 16 beats, no significant sustained arrhythmia.  She reports intermittent palpitations.  She questions whether or not hydralazine  could be contributing to worsening palpitations.  Reviewed with pharmacy who states this is not a  common side effect.  Continue to monitor symptoms, with transition from carvedilol  to metoprolol  as above.  4. PVD/carotid artery stenosis:  S/p left superficial femoral artery stenting in 2012 with early ISR, s/p balloon angioplasty which ultimately failed. More recently, she underwent drug-coated balloon angioplasty to left femoral-popliteal bypass graft in June 2024.  Carotid ultrasound in 02/2023 revealed 40 to 59% R ICA stenosis, 1 to 39% LICA stenosis.  Follows with vascular surgery.    5. Hyperlipidemia: LDL was 191 in 08/2023.  She has since resumed Crestor .  Pending repeat labs per PCP.  Continue Crestor .   6. Type 2 diabetes: A1c was 7.7 in 01/2023.  Follows with endocrinology.   7. ESRD: Creatinine was 4.6 in 01/2024. S/p right brachiocephalic fistula placement in early April 2025, SP recent revision in preparation for hemodialysis.  Following with nephrology.   8. Tobacco use/pulmonary nodules: She continues to smoke.  Full cessation advised.  Following with pulmonology.  Repeat CT chest pending.  9. Disposition:  Follow-up in 1 month.      Jude Norton, NP 02/07/2024, 11:00 AM

## 2024-02-07 NOTE — Patient Instructions (Signed)
 Medication Instructions:  Stop Carvedilol  as directed Start Metoprolol  Tartrate 25 mg twice daily  *If you need a refill on your cardiac medications before your next appointment, please call your pharmacy*  Lab Work: NONE ordered at this time of appointment   Testing/Procedures: NONE ordered at this time of appointment   Follow-Up: At [facility], you and your health needs are our priority.  As part of our continuing mission to provide you with exceptional heart care, our providers are all part of one team.  This team includes your primary Cardiologist (physician) and Advanced Practice Providers or APPs (Physician Assistants and Nurse Practitioners) who all work together to provide you with the care you need, when you need it.  Your next appointment:   1 month(s)  Provider:   Eilleen Grates, MD or Marlana Silvan, NP          We recommend signing up for the patient portal called "MyChart".  Sign up information is provided on this After Visit Summary.  MyChart is used to connect with patients for Virtual Visits (Telemedicine).  Patients are able to view lab/test results, encounter notes, upcoming appointments, etc.  Non-urgent messages can be sent to your provider as well.   To learn more about what you can do with MyChart, go to [email or web address].

## 2024-02-08 ENCOUNTER — Telehealth: Payer: Self-pay | Admitting: Nurse Practitioner

## 2024-02-08 DIAGNOSIS — J439 Emphysema, unspecified: Secondary | ICD-10-CM | POA: Diagnosis not present

## 2024-02-08 DIAGNOSIS — I1A0 Resistant hypertension: Secondary | ICD-10-CM | POA: Diagnosis not present

## 2024-02-08 DIAGNOSIS — I5033 Acute on chronic diastolic (congestive) heart failure: Secondary | ICD-10-CM | POA: Diagnosis not present

## 2024-02-08 DIAGNOSIS — I132 Hypertensive heart and chronic kidney disease with heart failure and with stage 5 chronic kidney disease, or end stage renal disease: Secondary | ICD-10-CM | POA: Diagnosis not present

## 2024-02-08 DIAGNOSIS — E1122 Type 2 diabetes mellitus with diabetic chronic kidney disease: Secondary | ICD-10-CM | POA: Diagnosis not present

## 2024-02-08 DIAGNOSIS — N185 Chronic kidney disease, stage 5: Secondary | ICD-10-CM | POA: Diagnosis not present

## 2024-02-08 NOTE — Telephone Encounter (Signed)
 Home health nurse Divine called to report there is a medication interaction between metoprolol  tartrate and clonidine . She did not know what the interaction is, just that she is required to report there is a medication interaction.   I searched on [employer], severe interaction found: Concurrent use of CLONIDINE  and BETA BLOCKERS may result in an increased risk of worsen sinus node dysfunction and atrioventricular node conduction, an increased risk of bradycardia and AV block and an increased risk of hypotension.    Will forward to Dr. Lavonne Prairie and Pharm D to review and advise.

## 2024-02-08 NOTE — Telephone Encounter (Signed)
 Pt c/o medication issue:  1. Name of Medication: metoprolol  tartrate (LOPRESSOR ) 25 MG tablet   2. How are you currently taking this medication (dosage and times per day)?   3. Are you having a reaction (difficulty breathing--STAT)? No  4. What is your medication issue? Home health nurse calling to advise of possible drug interaction with clonidine 

## 2024-02-09 ENCOUNTER — Encounter: Payer: Self-pay | Admitting: Nurse Practitioner

## 2024-02-09 NOTE — Telephone Encounter (Signed)
 Yes, they both can decrease heart rate. However, she has been on both beta blocker and clonidine . Just recently was switched from carvedilol  6.25mg  to metoprolol  25mg . Would recommend monitoring HR. Keep HR > 50. No medication changes needed. It also looks like she is going to wear a 14 day monitor due to palpitations.

## 2024-02-09 NOTE — Telephone Encounter (Signed)
 Left message for home health nurse Divine to callback.  Also called and spoke with patient, [name] and informed her of response from Pharm D to monitor HR and keep >50.  Patient verbalized understanding, states [facility] nurse will be back tomorrow, and expressed appreciation for call.

## 2024-02-10 ENCOUNTER — Other Ambulatory Visit: Payer: Self-pay

## 2024-02-10 ENCOUNTER — Telehealth: Payer: Self-pay

## 2024-02-10 DIAGNOSIS — I083 Combined rheumatic disorders of mitral, aortic and tricuspid valves: Secondary | ICD-10-CM | POA: Diagnosis not present

## 2024-02-10 DIAGNOSIS — E1122 Type 2 diabetes mellitus with diabetic chronic kidney disease: Secondary | ICD-10-CM | POA: Diagnosis not present

## 2024-02-10 DIAGNOSIS — Z7982 Long term (current) use of aspirin: Secondary | ICD-10-CM | POA: Diagnosis not present

## 2024-02-10 DIAGNOSIS — I2510 Atherosclerotic heart disease of native coronary artery without angina pectoris: Secondary | ICD-10-CM | POA: Diagnosis not present

## 2024-02-10 DIAGNOSIS — E1169 Type 2 diabetes mellitus with other specified complication: Secondary | ICD-10-CM | POA: Diagnosis not present

## 2024-02-10 DIAGNOSIS — E1151 Type 2 diabetes mellitus with diabetic peripheral angiopathy without gangrene: Secondary | ICD-10-CM | POA: Diagnosis not present

## 2024-02-10 DIAGNOSIS — I5033 Acute on chronic diastolic (congestive) heart failure: Secondary | ICD-10-CM | POA: Diagnosis not present

## 2024-02-10 DIAGNOSIS — Z7951 Long term (current) use of inhaled steroids: Secondary | ICD-10-CM | POA: Diagnosis not present

## 2024-02-10 DIAGNOSIS — E785 Hyperlipidemia, unspecified: Secondary | ICD-10-CM | POA: Diagnosis not present

## 2024-02-10 DIAGNOSIS — Z9181 History of falling: Secondary | ICD-10-CM | POA: Diagnosis not present

## 2024-02-10 DIAGNOSIS — I1A0 Resistant hypertension: Secondary | ICD-10-CM | POA: Diagnosis not present

## 2024-02-10 DIAGNOSIS — J439 Emphysema, unspecified: Secondary | ICD-10-CM | POA: Diagnosis not present

## 2024-02-10 DIAGNOSIS — Z794 Long term (current) use of insulin: Secondary | ICD-10-CM | POA: Diagnosis not present

## 2024-02-10 DIAGNOSIS — Z8673 Personal history of transient ischemic attack (TIA), and cerebral infarction without residual deficits: Secondary | ICD-10-CM | POA: Diagnosis not present

## 2024-02-10 DIAGNOSIS — I132 Hypertensive heart and chronic kidney disease with heart failure and with stage 5 chronic kidney disease, or end stage renal disease: Secondary | ICD-10-CM | POA: Diagnosis not present

## 2024-02-10 DIAGNOSIS — N185 Chronic kidney disease, stage 5: Secondary | ICD-10-CM | POA: Diagnosis not present

## 2024-02-10 NOTE — Telephone Encounter (Signed)
 Copied from CRM [id number]. Topic: Clinical - Home Health Verbal Orders >> Feb 10, 2024  2:15 PM Crispin Dolphin wrote: Caller/Agency: [name] with [facility] Callback Number: [phone] Service Requested: N/A - Request call back - Wed 5/21visit patient reported over the weekend had a moment of possible hallucination black spider with red dots in hand while she was sleep. Has not happened before or since. Next visit is today.  Frequency: N/A Any new concerns about the patient? N/A

## 2024-02-10 NOTE — Telephone Encounter (Signed)
 Direct transfer of return call from [facility] nurse Divine.  Shared response from Pharm D regarding interaction between metoprolol  and clonidine :  Yes, they both can decrease heart rate. However, she has been on both beta blocker and clonidine . Just recently was switched from carvedilol  6.25mg  to metoprolol  25mg . Would recommend monitoring HR. Keep HR > 50. No medication changes needed. It also looks like she is going to wear a 14 day monitor due to palpitations.     Divine also requested parameters for SBP, to call if SBP <90 or >160, and/or if symptomatic.   Divine reports patient also having palpitations, ongoing for months and has been discussed at office visit on 5/20 with Marlana Silvan, NP. Plan for now is to continue to monitor.  Divine verbalized understanding and expressed appreciation for call.

## 2024-02-10 NOTE — Transitions of Care (Post Inpatient/ED Visit) (Signed)
 Transition of Care week 4  Visit Note  02/10/2024  Name: [name] MRN: [id number]          DOB: November 22, 1945  Situation: Patient enrolled in [facility] 30-day program. Visit completed with patient by telephone.   Background:   Initial Transition Care Management Follow-up Telephone Call Date of Discharge: 01/03/24 Discharge Facility: [facility]) Type of Discharge: Inpatient Admission Primary Inpatient Discharge Diagnosis:: ERSD; DM How have you been since you were released from the hospital?: Better (Still short of breath with activity mostly) Any questions or concerns?: Yes Patient Questions/Concerns:: Ongoing issues with understanding the medication interactions, HH nurse really advocating for getting my medication looked at  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,  "small"   Anxiety    Arthritis    Cataract    bilateral - surgery   CKD (chronic kidney disease), stage IV (HCC)    followed by [facility]   Colon polyps    Depression    Diabetes mellitus 1998   type 2   Diverticulosis    Emphysema of lung (HCC)    "no one has evry told me that"   ESOPHAGEAL STRICTURE 08/27/2008   Family history of adverse reaction to anesthesia    daughter n/v   GERD (gastroesophageal reflux disease)    Glaucoma    bilateral   Heart failure (HCC) 01/04/2024   Hemorrhoids    hx   Hiatal hernia    Hyperlipidemia    Hypertension 2000   Migraines    Neuropathy    fingers and toes   Peripheral vascular disease (HCC)    Phlebitis    30 years ago  left leg   Stroke [facility])    multiple mini strokes ( brain aneurysm ).  Right side weaker. 2 strokes    Assessment: Patient Reported Symptoms: Cognitive Cognitive Status: Alert and oriented to person, place, and time      Neurological Neurological Review of Symptoms: No symptoms reported    HEENT HEENT Symptoms  Reported: No symptoms reported      Cardiovascular Cardiovascular Symptoms Reported: Swelling in legs or feet Does patient have uncontrolled Hypertension?: Yes (My blood pressure flucuates with my activity) Is patient checking Blood Pressure at home?: Yes Patient's Recent BP reading at home: 160/54 Cardiovascular Conditions: Hypertension, Heart failure Cardiovascular Management Strategies: Activity, Adequate rest, Coping strategies, Medication therapy Weight: 183 lb (83 kg) Cardiovascular Self-Management Outcome: 2 (bad) Cardiovascular Comment: My heart rate flucuates and I'm suppose to have it above 54  Respiratory Respiratory Symptoms Reported: Shortness of breath Respiratory Conditions: COPD, Shortness of breath Respiratory Self-Management Outcome: 2 (bad) Respiratory Comment: I do better with resting and not exerting myself  Endocrine Patient reports the following symptoms related to hypoglycemia or hyperglycemia : No symptoms reported Is patient diabetic?: Yes Is patient checking blood sugars at home?: Yes Endocrine Conditions: Diabetes  Gastrointestinal Gastrointestinal Symptoms Reported: No symptoms reported      Genitourinary Genitourinary Symptoms Reported: No symptoms reported Other Genitourinary Symptoms: Had outpatient surgery on 02/01/24 Genitourinary Conditions: Chronic kidney disease Genitourinary Management Strategies: Activity, Adequate rest, Fluid modification Genitourinary Self-Management Outcome: 3 (uncertain) Genitourinary Comment: I guess better  Integumentary Integumentary Symptoms Reported: Incision Additional Integumentary Details: Post op vascular "site with no problems" Skin Conditions: Wound Skin Self-Management Outcome: 4 (good)  Musculoskeletal Musculoskelatal Symptoms Reviewed: Weakness Musculoskeletal Conditions: Back pain,  Mobility limited Other Musculoskeletal Conditions: Chronic ongoing Falls in the past year?: No    Psychosocial Psychosocial  Symptoms Reported: Anxiety - if selected complete GAD Additional Psychological Details: Due to SOB Behavioral Health Conditions: Anxiety Behavioral Management Strategies: Activity, Medication therapy, Coping strategies Behavioral Health Self-Management Outcome: 3 (uncertain) Behavioral Health Comment: "I try to calm myself down and not worry"       Vitals:   02/10/24 1007  BP: (!) 160/54  Pulse: 62    Medications Reviewed Today     Reviewed by Jamie Mccoy, RN (Registered Nurse) on 02/10/24 at 1033  Med List Status: <None>   Medication Order Taking? Sig Documenting Provider Last Dose Status Informant  acetaminophen  (TYLENOL ) 500 MG tablet [id number] No Take 1,000 mg by mouth 2 (two) times daily as needed for mild pain (pain score 1-3). [provider] Taking Active Self  amLODipine  (NORVASC ) 10 MG tablet [id number] No Take 1 tablet (10 mg total) by mouth daily. Jodeane Mulligan, DO Taking Active Self  aspirin  EC 81 MG tablet [id number] No Take 1 tablet (81 mg total) by mouth daily. Swallow whole. Jodeane Mulligan, DO Taking Active Self  busPIRone  (BUSPAR ) 5 MG tablet [id number] No Take 1 tablet (5 mg total) by mouth 2 (two) times daily. Rhyne, [name] J, PA-C Taking Active   calcitRIOL  (ROCALTROL ) 0.25 MCG capsule [id number] No Take 0.25 mcg by mouth in the morning. [provider] Taking Active Self  cloNIDine  (CATAPRES ) 0.1 MG tablet [id number] No Take 1 tablet (0.1 mg total) by mouth at bedtime. Jodeane Mulligan, DO Taking Active Self  Fluticasone -Umeclidin-Vilant (TRELEGY ELLIPTA ) 200-62.5-25 MCG/ACT AEPB [id number] No Inhale 1 puff into the lungs daily. Mannam, Praveen, MD Taking Active Self  furosemide  (LASIX ) 40 MG tablet [id number] No Take 80 mg by mouth 2 (two) times daily. [provider] Taking Active Self           Med Note (LEE, NICOLE   Wed Jan 04, 2024  1:06 AM) Takes at 0730 and 1430.  hydrALAZINE  (APRESOLINE ) 100 MG tablet [id number] No Take 1  tablet (100 mg total) by mouth every 8 (eight) hours. Jodeane Mulligan, DO Taking Active Self  hydrOXYzine  (ATARAX ) 25 MG tablet [id number] No Take 1 tablet (25 mg total) by mouth 3 (three) times daily as needed for anxiety. Jodeane Mulligan, DO Taking Active Self  insulin  aspart (NOVOLOG  FLEXPEN) 100 UNIT/ML FlexPen [id number] No Inject 10-14 Units into the skin 3 (three) times daily before meals.  Patient taking differently: Inject 8-14 Units into the skin 3 (three) times daily as needed for high blood sugar (sliding scale).   Emilie Harden, MD Taking Active Self           Med Note Shann Darnel, [place] A   Wed Dec 14, 2023  3:36 PM)    insulin  degludec (TRESIBA  FLEXTOUCH) 200 UNIT/ML FlexTouch Pen [id number] No Inject 40 Units into the skin daily. Jodeane Mulligan, DO Taking Active Self  isosorbide  mononitrate (IMDUR ) 60 MG 24 hr tablet [id number] No Take 1 tablet (60 mg total) by mouth daily. Jodeane Mulligan, DO Taking Active Self  meclizine  (ANTIVERT ) 25 MG tablet [id number] No 1/2 tab up to 3 times daily for motion sickness/dizziness.  Patient taking differently: Take 25 mg by mouth 3 (three) times daily as needed for dizziness.   [place] Bureau, NP Taking Active Self  metolazone (ZAROXOLYN) 5 MG tablet [id number] No Take 5 mg by mouth every other day. [provider] Taking  Active Self           Med Note Lafayette Pierre   Thu Jan 26, 2024  2:09 PM)    metoprolol  tartrate (LOPRESSOR ) 25 MG tablet [id number]  Take 1 tablet (25 mg total) by mouth 2 (two) times daily. Jude Norton, NP  Active   Omega-3 Fatty Acids (FISH OIL) 1000 MG CAPS [id number] No Take 1,000 mg by mouth in the morning and at bedtime. Cordie Deters, PA-C Taking Active Self  oxyCODONE -acetaminophen  (PERCOCET) 5-325 MG tablet [id number] No Take 1 tablet by mouth every 6 (six) hours as needed.  Patient not taking: Reported on 02/07/2024   Bonnye Butts, PA-C Not Taking Active   rOPINIRole  (REQUIP ) 0.25 MG tablet  [id number] No Take 0.25 mg by mouth at bedtime. [provider] Taking Active Self  rosuvastatin  (CRESTOR ) 40 MG tablet [id number] No Take 1 tablet (40 mg total) by mouth daily. Paseda, Folashade R, FNP Taking Active Self            Goals Addressed             This Visit's Progress    COMPLETED: [facility] Transitions of Care (TOC) Care Plan   Improving    Problems: (Reviewed 02/10/24 Recent Hospitalization for treatment of CHF and CKD Stage new regimen ongoing - new intervention with graft by Dr. Charlotte Cookey outpatient appointment last week Medication management barrier      Home health RN is filling her pill box, diurectic added today  ongoing GI:no symptoms reported  Goal:  Over the next 30 days, the patient will not experience hospital readmission Continue to work with [facility] nurse and therapy ongoing follow up with for 4 more weeks per patient  Interventions:    Chronic Kidney Disease Interventions: Reviewed medications with patient and discussed importance of compliance    Advised patient, providing education and rationale, to monitor blood pressure daily and record, calling PCP for findings outside established parameters    Reviewed scheduled/upcoming provider appointments including    Last practice recorded BP readings:  BP Readings from Last 3 Encounters:  01/10/24 (!) 161/39  12/21/23 (!) 176/51  12/12/23 132/74   Most recent eGFR/CrCl:  Lab Results  Component Value Date   EGFR 15 (L) 09/19/2023    No components found for: "CRCL"  Patient Self Care Activities:  Attend all scheduled provider appointments Participate in Transition of Care Program/Attend TOC scheduled calls call office if I gain more than 2 pounds in one day or 5 pounds in one week track weight in diary follow rescue plan if symptoms flare-up  Continue to take rest breaks and contact provide for worsening shortness of breath with weight gain Continue to monitor BP and weight Bowel regimen: taking  stool softeners   Plan:  Has scheduled outpatient surgery with Dr. Charlotte Cookey 02/01/24 Telephone follow up appointment with care management team member scheduled for: Patient declines further telephonic follow up as offered Longitudinal Care Management as well as as Child psychotherapist for patient's desire for an apartment.  States no needs as HH SW came but was not what she wanted at this time Goal complete for case closure and currently has follow up with PCP in September, patient states she will follow up, may be seeking a new provider.         Goals completed, does not desire Longitudinal Care Management offered and states she knows how to contact if she changes her mind  Brown Cape, RN, BSN, CCM [facility], [facility] RN Care Manager Direct Dial: [phone]

## 2024-02-10 NOTE — Patient Instructions (Signed)
 Visit Information  Thank you for taking time to visit with me today. Please don't hesitate to contact me if I can be of assistance to you before our next scheduled telephone appointment.  Patient declines Longitudinal Case Management at this time. Has 4 more weeks with Home Health and feels that is what's needed.  Following is a copy of your care plan:   Goals Addressed   None     Patient verbalizes understanding of instructions and care plan provided today and agrees to view in MyChart. Active MyChart status and patient understanding of how to access instructions and care plan via MyChart confirmed with patient.     The patient has been provided with contact information for the care management team and has been advised to call with any health related questions or concerns.   Please call the care guide team at [phone] if you need to cancel or reschedule your appointment.   Please call the [place]  National Suicide Prevention Lifeline: (785) 225-4884 or TTY: 667 379 1101 TTY (220)448-2004) to talk to a trained counselor if you are experiencing a Mental Health or Behavioral Health Crisis or need someone to talk to.  Brown Cape, RN, BSN, CCM [facility], [facility] Health RN Care Manager Direct Dial: [phone]

## 2024-02-15 DIAGNOSIS — I132 Hypertensive heart and chronic kidney disease with heart failure and with stage 5 chronic kidney disease, or end stage renal disease: Secondary | ICD-10-CM | POA: Diagnosis not present

## 2024-02-15 DIAGNOSIS — J439 Emphysema, unspecified: Secondary | ICD-10-CM | POA: Diagnosis not present

## 2024-02-15 DIAGNOSIS — E1122 Type 2 diabetes mellitus with diabetic chronic kidney disease: Secondary | ICD-10-CM | POA: Diagnosis not present

## 2024-02-15 DIAGNOSIS — I1A0 Resistant hypertension: Secondary | ICD-10-CM | POA: Diagnosis not present

## 2024-02-15 DIAGNOSIS — N185 Chronic kidney disease, stage 5: Secondary | ICD-10-CM | POA: Diagnosis not present

## 2024-02-15 DIAGNOSIS — I5033 Acute on chronic diastolic (congestive) heart failure: Secondary | ICD-10-CM | POA: Diagnosis not present

## 2024-02-15 NOTE — Telephone Encounter (Signed)
 Called and advised [place]. No answer lvm . Please advise . KH

## 2024-02-16 ENCOUNTER — Telehealth: Payer: Self-pay | Admitting: Nurse Practitioner

## 2024-02-16 ENCOUNTER — Telehealth: Payer: Self-pay

## 2024-02-16 DIAGNOSIS — J439 Emphysema, unspecified: Secondary | ICD-10-CM | POA: Diagnosis not present

## 2024-02-16 DIAGNOSIS — I132 Hypertensive heart and chronic kidney disease with heart failure and with stage 5 chronic kidney disease, or end stage renal disease: Secondary | ICD-10-CM | POA: Diagnosis not present

## 2024-02-16 DIAGNOSIS — E1122 Type 2 diabetes mellitus with diabetic chronic kidney disease: Secondary | ICD-10-CM | POA: Diagnosis not present

## 2024-02-16 DIAGNOSIS — I5033 Acute on chronic diastolic (congestive) heart failure: Secondary | ICD-10-CM | POA: Diagnosis not present

## 2024-02-16 DIAGNOSIS — N185 Chronic kidney disease, stage 5: Secondary | ICD-10-CM | POA: Diagnosis not present

## 2024-02-16 DIAGNOSIS — I1A0 Resistant hypertension: Secondary | ICD-10-CM | POA: Diagnosis not present

## 2024-02-16 MED ORDER — HYDRALAZINE HCL 100 MG PO TABS: 100.0000 mg | ORAL_TABLET | Freq: Three times a day (TID) | ORAL | 3 refills | Status: AC

## 2024-02-16 NOTE — Telephone Encounter (Signed)
 [name] the [facility] nurse is requesting a callback from nurse [facility] regarding Parameters. She'd like to be called at [phone] but if no answer she'd like a voicemail left. Please advise

## 2024-02-16 NOTE — Telephone Encounter (Signed)
 Pt's medication was sent to pt's pharmacy as requested. Confirmation received.

## 2024-02-16 NOTE — Telephone Encounter (Signed)
 Copied from CRM [id number]. Topic: General - Other >> Feb 16, 2024  1:58 PM [name] wrote: Reason for CRM: [facility] with [facility]- need a call from [name]- Patient is fine with referral for psychiatrist for hallucinations, brain fog and anxiety,  needs it ASAP  [phone]

## 2024-02-16 NOTE — Telephone Encounter (Signed)
 Spoke with [employer], home health nurse and advised Dr Lavonne Prairie agrees with parameters to call if SBP <90 or >160, and/or if symptomatic and to keep HR >50.  [name] verbalizes understanding and thanked Charity fundraiser for the callback.

## 2024-02-16 NOTE — Telephone Encounter (Signed)
*  STAT* If patient is at the pharmacy, call can be transferred to refill team.   1. Which medications need to be refilled? (please list name of each medication and dose if known) hydrALAZINE  (APRESOLINE ) 100 MG tablet   2. Which pharmacy/location (including street and city if local pharmacy) is medication to be sent to? [id number] - [place], [place] - [address]    3. Do they need a 30 day or 90 day supply? 90

## 2024-02-22 ENCOUNTER — Telehealth: Payer: Self-pay

## 2024-02-22 ENCOUNTER — Other Ambulatory Visit: Payer: Self-pay | Admitting: Nurse Practitioner

## 2024-02-22 DIAGNOSIS — I132 Hypertensive heart and chronic kidney disease with heart failure and with stage 5 chronic kidney disease, or end stage renal disease: Secondary | ICD-10-CM | POA: Diagnosis not present

## 2024-02-22 DIAGNOSIS — E1122 Type 2 diabetes mellitus with diabetic chronic kidney disease: Secondary | ICD-10-CM | POA: Diagnosis not present

## 2024-02-22 DIAGNOSIS — R443 Hallucinations, unspecified: Secondary | ICD-10-CM

## 2024-02-22 DIAGNOSIS — I5033 Acute on chronic diastolic (congestive) heart failure: Secondary | ICD-10-CM | POA: Diagnosis not present

## 2024-02-22 DIAGNOSIS — N185 Chronic kidney disease, stage 5: Secondary | ICD-10-CM | POA: Diagnosis not present

## 2024-02-22 DIAGNOSIS — J439 Emphysema, unspecified: Secondary | ICD-10-CM | POA: Diagnosis not present

## 2024-02-22 DIAGNOSIS — I1A0 Resistant hypertension: Secondary | ICD-10-CM | POA: Diagnosis not present

## 2024-02-22 DIAGNOSIS — F32A Depression, unspecified: Secondary | ICD-10-CM

## 2024-02-22 NOTE — Telephone Encounter (Signed)
 Sent to provider

## 2024-02-24 ENCOUNTER — Other Ambulatory Visit: Payer: Self-pay | Admitting: Nurse Practitioner

## 2024-02-24 MED ORDER — HYDROXYZINE HCL 25 MG PO TABS: 25.0000 mg | ORAL_TABLET | Freq: Three times a day (TID) | ORAL | 2 refills | Status: DC | PRN

## 2024-02-27 ENCOUNTER — Ambulatory Visit: Attending: Surgery | Admitting: Physician Assistant

## 2024-02-27 ENCOUNTER — Other Ambulatory Visit: Payer: Self-pay

## 2024-02-27 VITALS — BP 175/50 | HR 72 | Temp 98.0°F | Ht 66.0 in | Wt 182.1 lb

## 2024-02-27 DIAGNOSIS — I132 Hypertensive heart and chronic kidney disease with heart failure and with stage 5 chronic kidney disease, or end stage renal disease: Secondary | ICD-10-CM | POA: Diagnosis not present

## 2024-02-27 DIAGNOSIS — I5033 Acute on chronic diastolic (congestive) heart failure: Secondary | ICD-10-CM | POA: Diagnosis not present

## 2024-02-27 DIAGNOSIS — N185 Chronic kidney disease, stage 5: Secondary | ICD-10-CM | POA: Diagnosis not present

## 2024-02-27 DIAGNOSIS — N186 End stage renal disease: Secondary | ICD-10-CM

## 2024-02-27 DIAGNOSIS — J439 Emphysema, unspecified: Secondary | ICD-10-CM | POA: Diagnosis not present

## 2024-02-27 DIAGNOSIS — I1A0 Resistant hypertension: Secondary | ICD-10-CM | POA: Diagnosis not present

## 2024-02-27 DIAGNOSIS — E1122 Type 2 diabetes mellitus with diabetic chronic kidney disease: Secondary | ICD-10-CM | POA: Diagnosis not present

## 2024-02-27 MED ORDER — ROPINIROLE HCL 0.25 MG PO TABS: 0.2500 mg | ORAL_TABLET | Freq: Every day | ORAL | 1 refills | Status: DC

## 2024-02-27 MED ORDER — HYDROXYZINE HCL 25 MG PO TABS: 25.0000 mg | ORAL_TABLET | Freq: Three times a day (TID) | ORAL | 2 refills | Status: DC | PRN

## 2024-02-27 NOTE — Telephone Encounter (Signed)
 Please advise if you PA for pt hydroxyzine . Fola please fill the requip . Thank you.   Julian Obey   CMA II

## 2024-02-27 NOTE — Progress Notes (Signed)
 POST OPERATIVE OFFICE NOTE    CC:  F/u for surgery  HPI:  [name] is a 78 y.o. female who is here for a postop visit.  She recently underwent right brachiocephalic AV fistula superficialization by Dr. Charlotte Cookey on 02/01/2024.  Her brachiocephalic fistula was first created on 12/21/2023.  This was done for permanent dialysis access.  She is not currently on dialysis.  After the patient's first procedure she did have some right hand cramping with exercise.  She did not have any right hand numbness, weakness, or pain.  She returns today for follow-up.  She believes that her incisions have healed well.  She continues to deny any right hand numbness, weakness, pain, or coldness.  She does continue to have intermittent right hand cramping with use of the hand.  This is bothersome to her, given that she is right-hand dominant.   Allergies  Allergen Reactions   Ciprofloxacin  Swelling   Ozempic  (0.25 Or 0.5 Mg-Dose) [Semaglutide (0.25 Or 0.5mg -Dos)] Nausea And Vomiting   Plavix [Clopidogrel Bisulfate] Palpitations   Amoxicillin Itching and Swelling    FACE & EYES SWELL   Ace Inhibitors Cough    Sore throat   Atorvastatin      myalgias   Lyrica [Pregabalin] Itching and Nausea Only     gain weight   Penicillins Other (See Comments)    Pt unsure if there is an allergic reaction     Current Outpatient Medications  Medication Sig Dispense Refill   acetaminophen  (TYLENOL ) 500 MG tablet Take 1,000 mg by mouth 2 (two) times daily as needed for mild pain (pain score 1-3).     amLODipine  (NORVASC ) 10 MG tablet Take 1 tablet (10 mg total) by mouth daily. 30 tablet 0   aspirin  EC 81 MG tablet Take 1 tablet (81 mg total) by mouth daily. Swallow whole. 30 tablet 12   busPIRone  (BUSPAR ) 5 MG tablet Take 1 tablet (5 mg total) by mouth 2 (two) times daily.     calcitRIOL  (ROCALTROL ) 0.25 MCG capsule Take 0.25 mcg by mouth in the morning.     cloNIDine  (CATAPRES ) 0.1 MG tablet Take 1 tablet (0.1 mg total)  by mouth at bedtime. 60 tablet 11   Fluticasone -Umeclidin-Vilant (TRELEGY ELLIPTA ) 200-62.5-25 MCG/ACT AEPB Inhale 1 puff into the lungs daily. 3 each 3   furosemide  (LASIX ) 40 MG tablet Take 80 mg by mouth 2 (two) times daily.     hydrALAZINE  (APRESOLINE ) 100 MG tablet Take 1 tablet (100 mg total) by mouth every 8 (eight) hours. 270 tablet 3   hydrOXYzine  (ATARAX ) 25 MG tablet Take 1 tablet (25 mg total) by mouth every 8 (eight) hours as needed for anxiety. 90 tablet 2   insulin  aspart (NOVOLOG  FLEXPEN) 100 UNIT/ML FlexPen Inject 10-14 Units into the skin 3 (three) times daily before meals. (Patient taking differently: Inject 8-14 Units into the skin 3 (three) times daily as needed for high blood sugar (sliding scale).) 45 mL 3   insulin  degludec (TRESIBA  FLEXTOUCH) 200 UNIT/ML FlexTouch Pen Inject 40 Units into the skin daily. 18 mL 3   isosorbide  mononitrate (IMDUR ) 60 MG 24 hr tablet Take 1 tablet (60 mg total) by mouth daily. 30 tablet 0   meclizine  (ANTIVERT ) 25 MG tablet 1/2 tab up to 3 times daily for motion sickness/dizziness. (Patient taking differently: Take 25 mg by mouth 3 (three) times daily as needed for dizziness.) 30 tablet 0   metolazone (ZAROXOLYN) 5 MG tablet Take 5 mg by mouth every other day.  metoprolol  tartrate (LOPRESSOR ) 25 MG tablet Take 1 tablet (25 mg total) by mouth 2 (two) times daily. 180 tablet 3   Omega-3 Fatty Acids (FISH OIL) 1000 MG CAPS Take 1,000 mg by mouth in the morning and at bedtime.     oxyCODONE -acetaminophen  (PERCOCET) 5-325 MG tablet Take 1 tablet by mouth every 6 (six) hours as needed. (Patient not taking: Reported on 02/07/2024) 16 tablet 0   rOPINIRole  (REQUIP ) 0.25 MG tablet Take 0.25 mg by mouth at bedtime.     rosuvastatin  (CRESTOR ) 40 MG tablet Take 1 tablet (40 mg total) by mouth daily. 90 tablet 1   No current facility-administered medications for this visit.     ROS:  See HPI  Physical Exam:  Incision: Right upper arm incisions  well-healed without signs of infection Extremities: 2+ right radial pulse.  Right brachiocephalic fistula with great thrill throughout upper arm Neuro: Intact motor and sensation of right hand    Assessment/Plan:  This is a 78 y.o. female who is here for postop visit  - The patient recently underwent superficialization of a right upper arm brachiocephalic fistula on 5/14.  Her right upper arm incisions are well-healed without signs of infection -She has intact motor and sensation of the right hand.  She has a 2+ right radial pulse -Her right brachiocephalic fistula has a great thrill throughout the upper arm - She endorses continued, intermittent right hand cramping with use of the hand.  She denies cramping at rest, pain, numbness, weakness - I have explained to the patient that her cramping is not uncommon since her hand now has less blood flow than prior to fistula creation.  She does not have any severe symptoms of steal requiring ligation of the fistula.  I have encouraged the patient that with exercise of the right hand, her cramping may decrease or go away with time.  She is aware that we may have to pursue fistula ligation in the future if she cannot tolerate her hand cramping.  At this time we will avoid fistula ligation.  - She is currently not on dialysis.  If she needs to start soon, her fistula can be used by June 25th - We also follow the patient for PAD and carotid artery disease.  She can follow-up with our office in 6 months with carotid duplex, LLE bypass graft duplex, and ABIs   Deneise Finlay, PA-C [facility] [phone]   Clinic MD:  Charlotte Cookey

## 2024-02-29 ENCOUNTER — Other Ambulatory Visit: Payer: Self-pay

## 2024-02-29 DIAGNOSIS — J439 Emphysema, unspecified: Secondary | ICD-10-CM | POA: Diagnosis not present

## 2024-02-29 DIAGNOSIS — I132 Hypertensive heart and chronic kidney disease with heart failure and with stage 5 chronic kidney disease, or end stage renal disease: Secondary | ICD-10-CM | POA: Diagnosis not present

## 2024-02-29 DIAGNOSIS — I6523 Occlusion and stenosis of bilateral carotid arteries: Secondary | ICD-10-CM

## 2024-02-29 DIAGNOSIS — I5033 Acute on chronic diastolic (congestive) heart failure: Secondary | ICD-10-CM | POA: Diagnosis not present

## 2024-02-29 DIAGNOSIS — I70219 Atherosclerosis of native arteries of extremities with intermittent claudication, unspecified extremity: Secondary | ICD-10-CM

## 2024-02-29 DIAGNOSIS — N185 Chronic kidney disease, stage 5: Secondary | ICD-10-CM | POA: Diagnosis not present

## 2024-02-29 DIAGNOSIS — E1122 Type 2 diabetes mellitus with diabetic chronic kidney disease: Secondary | ICD-10-CM | POA: Diagnosis not present

## 2024-02-29 DIAGNOSIS — I1A0 Resistant hypertension: Secondary | ICD-10-CM | POA: Diagnosis not present

## 2024-03-01 ENCOUNTER — Other Ambulatory Visit (HOSPITAL_COMMUNITY): Payer: Self-pay

## 2024-03-05 ENCOUNTER — Other Ambulatory Visit (HOSPITAL_COMMUNITY): Payer: Self-pay

## 2024-03-05 ENCOUNTER — Ambulatory Visit (INDEPENDENT_AMBULATORY_CARE_PROVIDER_SITE_OTHER): Payer: Medicare Other | Admitting: Internal Medicine

## 2024-03-05 ENCOUNTER — Other Ambulatory Visit: Payer: Self-pay | Admitting: Internal Medicine

## 2024-03-05 ENCOUNTER — Other Ambulatory Visit: Payer: Self-pay

## 2024-03-05 ENCOUNTER — Encounter: Payer: Self-pay | Admitting: Internal Medicine

## 2024-03-05 VITALS — BP 138/86 | HR 62 | Ht 66.0 in | Wt 183.6 lb

## 2024-03-05 DIAGNOSIS — E66811 Obesity, class 1: Secondary | ICD-10-CM

## 2024-03-05 DIAGNOSIS — Z794 Long term (current) use of insulin: Secondary | ICD-10-CM

## 2024-03-05 DIAGNOSIS — E785 Hyperlipidemia, unspecified: Secondary | ICD-10-CM | POA: Diagnosis not present

## 2024-03-05 DIAGNOSIS — N184 Chronic kidney disease, stage 4 (severe): Secondary | ICD-10-CM

## 2024-03-05 DIAGNOSIS — E1122 Type 2 diabetes mellitus with diabetic chronic kidney disease: Secondary | ICD-10-CM | POA: Diagnosis not present

## 2024-03-05 DIAGNOSIS — E1169 Type 2 diabetes mellitus with other specified complication: Secondary | ICD-10-CM | POA: Diagnosis not present

## 2024-03-05 LAB — POCT GLYCOSYLATED HEMOGLOBIN (HGB A1C): Hemoglobin A1C: 8.2 % — AB (ref 4.0–5.6)

## 2024-03-05 MED ORDER — NOVOLOG FLEXPEN 100 UNIT/ML ~~LOC~~ SOPN: 10.0000 [IU] | PEN_INJECTOR | Freq: Three times a day (TID) | SUBCUTANEOUS | 3 refills | Status: AC

## 2024-03-05 NOTE — Addendum Note (Signed)
 Addended by: Vernon Goodpasture on: 03/05/2024 03:12 PM   Modules accepted: Orders

## 2024-03-05 NOTE — Patient Instructions (Addendum)
 Please  continue: - Tresiba  40 units daily - Novolog : 10-14 units 2x day before meals   Please rotate sugar checks throughout the day.  STOP Candy!  Please return in 4 months with your sugar log.

## 2024-03-05 NOTE — Progress Notes (Addendum)
 Patient ID: [name], female   DOB: [birth year], 78 y.o.   MRN: [id number]   HPI: [name] is a 78 y.o.-year-old female, initially referred by her cardiologist, Dr.Hochrein, presenting for follow-up for DM2, dx in ~2000, insulin -dependent since ~2005, uncontrolled, with complications (PVD - s/p stents and fem-pop bypass; h/o CVA x3; ESRD; DR; PN). She saw my colleague, Dr. Washington Hacker many years ago.  Last visit with me 4 months ago.  Interim history: No increased urination, nausea, chest pain.   She is overwhelmed by the situation at home (husband with dementia) and also with her own medical care. Also, since last visit, she was diagnosed with end-stage renal disease and had a right AV fistula placed. She did not start HD yet. Her mouth is dry >> sucks on candy.  She retired last week.   Reviewed HbA1c levels: Lab Results  Component Value Date   HGBA1C 7.3 (A) 10/31/2023   HGBA1C 8.9 (H) 08/08/2023   HGBA1C 8.4 (A) 07/04/2023   HGBA1C 7.7 (A) 02/07/2023   HGBA1C 7.6 (H) 08/04/2022   HGBA1C 7.6 (A) 05/31/2022   HGBA1C 7.6 (A) 01/22/2022   HGBA1C 7.1 (H) 11/17/2021   HGBA1C 8.5 (H) 08/04/2021   HGBA1C 8.7 (A) 06/08/2021  02/07/2023: HbA1c calculated from fructosamine is 6.13%, which is excellent. 05/31/2022: HbA1c calc. From fructosamine 6.5% 01/26/2021: HbA1c calculated from fructosamine is 7.47%, higher. 09/22/2020: HbA1c calculated from fructosamine is much better than the directly measured HbA1c, at 6.25% 05/21/2020: HbA1c calculated from fructosamine 7.3%, much better than the directly measured one, and stable from before 01/14/2020: HbA1c calculated from fructosamine 7.3% 10/08/2019: HbA1c calculated from fructosamine: 6.9%  She was previously on: - Novolog  70/30 35 units before breakfast and 30 units before dinner- injecting hips and arms due to abdominal pain - Farxiga  5 mg in am - started 06/2019 >> 10 mg daily - increased 09/2019  Currently on: -  >> STOPPED by nephro -  Tresiba  40 units daily - Novolog : 10-14 units 2x day before meals >> 8-14 units for all meals    She could not tolerate Ozempic  (started 11/2018) due to nausea, vomiting, abdominal pain-stopped 04/2019. She was on Invokana  >> intolerance 2/2 weight loss and dehydration >> hospitalization. She was on Metformin  and sulfonylurea at diagnosis. She was also on Januvia  at the same time with Invokana  >> stopped along with Invokana .  Pt was checking sugar 1x a day (declines a CGM): - am: 82-102 >> 79-105 >> 80-100 >> 71-121, 152 >> 96-157 - 2h after b'fast: 326 >> n/c >> 326 (10/2019) >> n/c - before lunch: 124-172 >> n/c >> 80s-100s >> n/c - 2h after lunch: 280, 286 >> n/c >> 250 >> n/c >> 228 >> n/c - before dinner: n/c >> 180 >> <140 >> 76 >> n/c - 2h after dinner: 247 >> 196- 201 >> 172 >> n/c  - bedtime: n/c >> 178 >> n/c - nighttime: 68  - seldom >> 57 (took Novolog  and skipped dinner!!!), 129 >> n/c Lowest sugar was 57 >> 82 >> 79 >> 80 >> 96; she has hypoglycemia awareness at 80. Highest sugar was 335 (no medications) >> ... 119 >> 100s >> 157  Glucometer: Livongo >> One Touch Verio  Pt's meals are: - Breakfast: eggs or cheese half a sandwich - Lunch: half a sandwich, apple - Dinner 9-9:30 pm: chicken, veggies - Snacks: stopped cheese; now apples and pickles, bedtime snack: orange or apple  She works part-time, from 10 am - 2  pm Tue-Fri.   -+ End-stage renal disease (Dr. Zelda Hickman), last BUN/creatinine:  Lab Results  Component Value Date   BUN 74 (H) 02/01/2024   BUN 55 (H) 01/10/2024   CREATININE 4.60 (H) 02/01/2024   CREATININE 3.46 (H) 01/10/2024  01/13/2024: GFR 11 She was taken off olmesartan  40.  -+ HL; last set of lipids: Lab Results  Component Value Date   CHOL 249 (H) 08/08/2023   HDL 57 08/08/2023   LDLCALC 166 (H) 08/08/2023   LDLDIRECT 85 01/23/2024   TRIG 129 08/08/2023   CHOLHDL 4.4 08/08/2023  On Crestor   40 mg daily (restarted the above results returned,  as she was off), omega-3 fatty acids 1200 mg daily.  - last eye exam was: 11/16/2023: + DR. She also has glaucoma. Dr. [place] Frasier at [facility] care. She has a h/o cataract surgery.  -+ Numbness and tingling in her feet.  She has a callus on the left foot.  She sees a podiatrist.  She did not tolerate Lyrica.  In 05/2022, she had an infected ischemic ulcer due to arterial insufficiency after injury to her left foot in 09/2021.  She had left femoropopliteal bypass graft 11/18/2021.  Afterwards, she had a balloon angioplasty >> L blood flow improved >> she got back feeling in her toes.  Last foot exam was here in clinic: 07/04/2023.   On ASA 81.  Pt has FH of DM in sister.  She also has a history of nontoxic multinodular goiter.  Latest TSH was reviewed and this was normal: Lab Results  Component Value Date   TSH 2.44 08/08/2023   She also has a history of HTN, brain aneurysm, emphysema, esophageal stricture, GERD.  She had hernia repair surgery- had mesh placed >> persistent abdominal pain. She started to take a supplement and vitamins in 09/2021 (by [facility] [place]) - sugars started to improve afterwards. She was admitted 12/8-07/2022 With chest pain, dizziness, shortness of breath-hypertensive urgency (sBP 200) - considered due to stress and anxiety.    Her husband's nephew, Patterson Bora, was also my patient and he died at the beginning of March 25, 2020 after he stopped going to dialysis.  ROS: + see HPI  I reviewed pt's medications, allergies, PMH, social hx, family hx, and changes were documented in the history of present illness. Otherwise, unchanged from my initial visit note.  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,  small   Anxiety    Arthritis    Cataract    bilateral - surgery   CKD (chronic kidney disease), stage IV (HCC)    followed by [facility]   Colon polyps    Depression    Diabetes mellitus 25-Mar-1997   type 2   Diverticulosis    Emphysema of lung (HCC)    no one has evry told me that   ESOPHAGEAL STRICTURE 08/27/2008   Family history of adverse reaction to anesthesia    daughter n/v   GERD (gastroesophageal reflux disease)    Glaucoma    bilateral   Heart failure (HCC) 01/04/2024   Hemorrhoids    hx   Hiatal hernia    Hyperlipidemia    Hypertension 2000   Migraines    Neuropathy    fingers and toes   Peripheral vascular disease (HCC)    Phlebitis    30 years ago  left leg   Stroke [facility])    multiple mini strokes ( brain aneurysm ).  Right side weaker. 2 strokes   Past Surgical History:  Procedure Laterality Date   ABDOMINAL AORTAGRAM N/A 01/04/2012   Procedure: ABDOMINAL AORTAGRAM;  Surgeon: Margherita Shell, MD;  Location: [facility] CATH LAB;  Service: Cardiovascular;  Laterality: N/A;   ABDOMINAL AORTAGRAM N/A 08/15/2012   Procedure: ABDOMINAL Tommi Fraise;  Surgeon: Margherita Shell, MD;  Location: [facility] CATH LAB;  Service: Cardiovascular;  Laterality: N/A;   ABDOMINAL AORTOGRAM W/LOWER EXTREMITY Left 11/17/2021   Procedure: ABDOMINAL AORTOGRAM W/LOWER EXTREMITY;  Surgeon: Margherita Shell, MD;  Location: [facility] INVASIVE CV LAB;  Service: Cardiovascular;  Laterality: Left;   ABDOMINAL AORTOGRAM W/LOWER EXTREMITY N/A 03/09/2022   Procedure: ABDOMINAL AORTOGRAM W/LOWER EXTREMITY;  Surgeon: Margherita Shell, MD;  Location: [facility] INVASIVE CV LAB;  Service: Cardiovascular;  Laterality: N/A;   ABDOMINAL AORTOGRAM W/LOWER EXTREMITY N/A 03/15/2023   Procedure: ABDOMINAL AORTOGRAM W/LOWER EXTREMITY;  Surgeon: Margherita Shell, MD;  Location: [facility] INVASIVE CV LAB;  Service: Cardiovascular;  Laterality: N/A;   ABDOMINAL HYSTERECTOMY  1984   AV FISTULA PLACEMENT Right 12/21/2023   Procedure: RIGHT ARM BRACHIOCEPHALIC ARTERIOVENOUS (AV) FISTULA CREATION;  Surgeon: Margherita Shell, MD;  Location: [facility] OR;  Service: Vascular;  Laterality: Right;    BREAST SURGERY     cataract surgery Right 10/22/2015   cataract surgery Left 11/05/2015   CESAREAN SECTION     CHOLECYSTECTOMY     COLONOSCOPY  07/2010   DENTAL SURGERY  05/05/2012   left lower    ESOPHAGOGASTRODUODENOSCOPY  07/2010   EYE SURGERY  07/2013   Laser-Glaucoma   FEMORAL ARTERY STENT  05/11/2011   Left superficial femoral and popliteal artery   FEMORAL-POPLITEAL BYPASS GRAFT Left 11/18/2021   Procedure: LEFT FEMORAL-POPLITEAL ARTERY BYPASS GRAFT;  Surgeon: Margherita Shell, MD;  Location: [facility] OR;  Service: Vascular;  Laterality: Left;   FISTULA SUPERFICIALIZATION Right 02/01/2024   Procedure: RIGHT AV FISTULA SUPERFICIALIZATION;  Surgeon: Margherita Shell, MD;  Location: [facility] OR;  Service: Vascular;  Laterality: Right;   HERNIA REPAIR     times two   KNEE SURGERY     LOWER EXTREMITY ANGIOGRAM Left 08/15/2012   Procedure: LOWER EXTREMITY ANGIOGRAM;  Surgeon: Margherita Shell, MD;  Location[facility] CATH LAB;  Service: Cardiovascular;  Laterality: Left;  lt leg angio   PERIPHERAL VASCULAR BALLOON ANGIOPLASTY Left 03/09/2022   Procedure: PERIPHERAL VASCULAR BALLOON ANGIOPLASTY;  Surgeon: Margherita Shell, MD;  Location: [facility] INVASIVE CV LAB;  Service: Cardiovascular;  Laterality: Left;   PERIPHERAL VASCULAR BALLOON ANGIOPLASTY Left 03/15/2023   Procedure: PERIPHERAL VASCULAR BALLOON ANGIOPLASTY;  Surgeon: Margherita Shell, MD;  Location: [facility] INVASIVE CV LAB;  Service: Cardiovascular;  Laterality: Left;   rotator cuff surgery     THYROID  SURGERY     Social History   Socioeconomic History   Marital status: Married    Spouse name: Not on file   Number of children: 2   Years of education: Not on file   Highest education level: 12th grade  Occupational History   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  Tobacco Use   Smoking status: Every Day    Current packs/day: 0.25    Average packs/day: 0.4 packs/day for 48.3 years (20.1 ttl pk-yrs)    Types: Cigarettes    Start  date: 11/30/2023    Passive exposure: Never   Smokeless tobacco: Never   Tobacco comments:  11/18/20 in process of quiting    03/31/23 5 cigarettes a day   Vaping Use   Vaping status: Never Used  Substance and Sexual Activity   Alcohol  use: Never   Drug use: Never   Sexual activity: Not Currently    Birth control/protection: None, Surgical    Comment: Hysterctomy  Other Topics Concern   Not on file  Social History Narrative   Lives with husband.        Social Drivers of Corporate investment banker Strain: Low Risk  (09/19/2023)   Overall Financial Resource Strain (CARDIA)    Difficulty of Paying Living Expenses: Not hard at all  Food Insecurity: No Food Insecurity (02/10/2024)   Hunger Vital Sign    Worried About Running Out of Food in the Last Year: Never true    Ran Out of Food in the Last Year: Never true  Transportation Needs: No Transportation Needs (02/10/2024)   PRAPARE - Administrator, Civil Service (Medical): No    Lack of Transportation (Non-Medical): No  Physical Activity: Unknown (09/19/2023)   Exercise Vital Sign    Days of Exercise per Week: 0 days    Minutes of Exercise per Session: Not on file  Stress: Stress Concern Present (09/19/2023)   [employer] of Occupational Health - Occupational Stress Questionnaire    Feeling of Stress : Very much  Social Connections: Socially Integrated (01/04/2024)   Social Connection and Isolation Panel    Frequency of Communication with Friends and Family: More than three times a week    Frequency of Social Gatherings with Friends and Family: Three times a week    Attends Religious Services: More than 4 times per year    Active Member of Clubs or Organizations: Yes    Attends Banker Meetings: 1 to 4 times per year    Marital Status: Married  Catering manager Violence: Not At Risk (02/10/2024)   Humiliation, Afraid, Rape, and Kick questionnaire    Fear of Current or Ex-Partner: No     Emotionally Abused: No    Physically Abused: No    Sexually Abused: No   Current Outpatient Medications on File Prior to Visit  Medication Sig Dispense Refill   acetaminophen  (TYLENOL ) 500 MG tablet Take 1,000 mg by mouth 2 (two) times daily as needed for mild pain (pain score 1-3).     amLODipine  (NORVASC ) 10 MG tablet Take 1 tablet (10 mg total) by mouth daily. 30 tablet 0   aspirin  EC 81 MG tablet Take 1 tablet (81 mg total) by mouth daily. Swallow whole. 30 tablet 12   busPIRone  (BUSPAR ) 5 MG tablet Take 1 tablet (5 mg total) by mouth 2 (two) times daily.     calcitRIOL  (ROCALTROL ) 0.25 MCG capsule Take 0.25 mcg by mouth in the morning.     cloNIDine  (CATAPRES ) 0.1 MG tablet Take 1 tablet (0.1 mg total) by mouth at bedtime. 60 tablet 11   Fluticasone -Umeclidin-Vilant (TRELEGY ELLIPTA ) 200-62.5-25 MCG/ACT AEPB Inhale 1 puff into the lungs daily. 3 each 3   furosemide  (LASIX ) 40 MG tablet Take 80 mg by mouth 2 (two) times daily.     hydrALAZINE  (APRESOLINE ) 100 MG tablet Take 1 tablet (100 mg total) by mouth every 8 (eight) hours. 270 tablet 3   hydrOXYzine  (ATARAX ) 25 MG tablet Take 1 tablet (25 mg total) by mouth every 8 (eight) hours as needed for anxiety. 90 tablet 2   insulin  aspart (NOVOLOG  FLEXPEN) 100 UNIT/ML FlexPen  Inject 10-14 Units into the skin 3 (three) times daily before meals. (Patient taking differently: Inject 8-14 Units into the skin 3 (three) times daily as needed for high blood sugar (sliding scale).) 45 mL 3   insulin  degludec (TRESIBA  FLEXTOUCH) 200 UNIT/ML FlexTouch Pen Inject 40 Units into the skin daily. 18 mL 3   isosorbide  mononitrate (IMDUR ) 60 MG 24 hr tablet Take 1 tablet (60 mg total) by mouth daily. 30 tablet 0   meclizine  (ANTIVERT ) 25 MG tablet 1/2 tab up to 3 times daily for motion sickness/dizziness. (Patient taking differently: Take 25 mg by mouth 3 (three) times daily as needed for dizziness.) 30 tablet 0   metolazone (ZAROXOLYN) 5 MG tablet Take 5 mg by  mouth every other day.     metoprolol  tartrate (LOPRESSOR ) 25 MG tablet Take 1 tablet (25 mg total) by mouth 2 (two) times daily. 180 tablet 3   Omega-3 Fatty Acids (FISH OIL) 1000 MG CAPS Take 1,000 mg by mouth in the morning and at bedtime.     oxyCODONE -acetaminophen  (PERCOCET) 5-325 MG tablet Take 1 tablet by mouth every 6 (six) hours as needed. (Patient not taking: Reported on 02/07/2024) 16 tablet 0   rOPINIRole  (REQUIP ) 0.25 MG tablet Take 1 tablet (0.25 mg total) by mouth at bedtime. Take 0.25 mg by mouth at bedtime. 90 tablet 1   rosuvastatin  (CRESTOR ) 40 MG tablet Take 1 tablet (40 mg total) by mouth daily. 90 tablet 1   No current facility-administered medications on file prior to visit.   Allergies  Allergen Reactions   Ciprofloxacin  Swelling   Ozempic  (0.25 Or 0.5 Mg-Dose) [Semaglutide (0.25 Or 0.5mg -Dos)] Nausea And Vomiting   Plavix [Clopidogrel Bisulfate] Palpitations   Amoxicillin Itching and Swelling    FACE & EYES SWELL   Ace Inhibitors Cough    Sore throat   Atorvastatin      myalgias   Lyrica [Pregabalin] Itching and Nausea Only     gain weight   Penicillins Other (See Comments)    Pt unsure if there is an allergic reaction    Family History  Problem Relation Age of Onset   CAD Mother 46       Died of MI   Hypertension Mother    Heart attack Mother    Heart disease Mother    CAD Father 6       Died of MI   Heart disease Father    Heart attack Father    CAD Brother 67       Two brothers died of MI   Heart attack Brother    Heart disease Brother        Amputation   Diabetes Sister    Hypertension Sister    Heart attack Brother    Heart disease Brother    Stroke Sister    Colon cancer Neg Hx    Stomach cancer Neg Hx    Esophageal cancer Neg Hx    PE: BP 138/86   Pulse 62   Ht 5' 6 (1.676 m)   Wt 183 lb 9.6 oz (83.3 kg)   SpO2 96%   BMI 29.63 kg/m  Wt Readings from Last 10 Encounters:  03/05/24 183 lb 9.6 oz (83.3 kg)  02/27/24 182 lb 1.6 oz  (82.6 kg)  02/10/24 183 lb (83 kg)  02/07/24 183 lb (83 kg)  02/01/24 184 lb (83.5 kg)  01/31/24 184 lb (83.5 kg)  01/23/24 189 lb (85.7 kg)  01/23/24 189 lb 3.2 oz (85.8 kg)  01/20/24 190 lb (86.2 kg)  01/11/24 190 lb (86.2 kg)   Constitutional: overweight, in NAD Eyes:  EOMI, no exophthalmos ENT: no neck masses, no cervical lymphadenopathy Cardiovascular: RRR, No MRG Respiratory: CTA Musculoskeletal: no deformities Skin:no rashes Neurological: no tremor with outstretched hands  ASSESSMENT: 1. DM2, insulin -dependent, uncontrolled, with complications - PVD - s/p stents and fem-pop bypass - h/o CVA - CKD stage 3b - DR - PN  Her antipancreatic antibodies were negative: Component     Latest Ref Rng & Units 07/22/2016  Glutamic Acid Decarb Ab     <5 IU/mL <5   2.  Hyperlipidemia  3.  Obesity class 1  PLAN:  1. Patient with longstanding, uncontrolled, type 2 diabetes, on basal-bolus insulin  regimen.  Her SGLT2 inhibitor was discontinued by nephrology recently.  We tried to add Mounjaro  at last visit, but she did not start it.  Since last visit, she developed end-stage renal disease after being admitted with CHF exacerbation and fluid overload 01/03/2024. - At last visit, there was improvement in her diabetes control based on her HbA1c which was lower, at 7.3% but she was not taking blood sugars later in the day, only in the morning.  I advised her to try to check some sugars later in the day (of note, she declined a CGM) but did not change the regimen other than suggested Mounjaro . -After her admission for CHF, she complains of a metal taste in her mouth and started to suck on candy.  I believe that this is the reason why her blood sugars are elevated.  Her HbA1c is higher today (see below).  For now, we discussed about checking some blood sugars later in the day, since it is difficult to change the regimen based on only morning sugars, but I advised her to continue Tresiba  and  NovoLog . -At today's visit we discussed that after starting dialysis, she may start requiring less insulin  so she should pay close attention to blood sugars and let me know if she starts to have lows. - I suggested to:  Patient Instructions  Please  continue: - Tresiba  40 units daily - Novolog : 10-14 units 2x day before meals   Please rotate sugar checks throughout the day.  STOP Candy!  Please return in 4 months with your sugar log.    - we checked her HbA1c: 8.2% (higher) - advised to check sugars at different times of the day - 4x a day, rotating check times - advised for yearly eye exams >> she is UTD - return to clinic in 4 months  2. HL - Latest lipid panel was reviewed from 08/10/2023 and 02/07/2024.  LDL improved dramatically between the 2 time points, after restarting Crestor .  Fractions are at goal otherwise. Lab Results  Component Value Date   CHOL 249 (H) 08/08/2023   HDL 57 08/08/2023   LDLCALC 166 (H) 08/08/2023   LDLDIRECT 85 01/23/2024   TRIG 129 08/08/2023   CHOLHDL 4.4 08/08/2023  -On Crestor  40 mg daily, omega-3 fatty acids 1200 mg daily-no side effects.  3.  Overweight - We previously could not use a GLP-1 receptor agonist due to GI symptoms.  At last visit, she agreed to try Mounjaro  at a low dose and increase as tolerated.  However, she is not taking this now. - she was on Farxiga  at last visit, but she was taken off by nephrology. - She gained 3 pounds before last visit but lost 12 pounds since then  Emilie Harden, MD PhD  [facility]

## 2024-03-07 ENCOUNTER — Telehealth: Payer: Self-pay | Admitting: Nurse Practitioner

## 2024-03-07 DIAGNOSIS — E1122 Type 2 diabetes mellitus with diabetic chronic kidney disease: Secondary | ICD-10-CM | POA: Diagnosis not present

## 2024-03-07 DIAGNOSIS — I132 Hypertensive heart and chronic kidney disease with heart failure and with stage 5 chronic kidney disease, or end stage renal disease: Secondary | ICD-10-CM | POA: Diagnosis not present

## 2024-03-07 DIAGNOSIS — I1A0 Resistant hypertension: Secondary | ICD-10-CM | POA: Diagnosis not present

## 2024-03-07 DIAGNOSIS — J439 Emphysema, unspecified: Secondary | ICD-10-CM | POA: Diagnosis not present

## 2024-03-07 DIAGNOSIS — I5033 Acute on chronic diastolic (congestive) heart failure: Secondary | ICD-10-CM | POA: Diagnosis not present

## 2024-03-07 DIAGNOSIS — N185 Chronic kidney disease, stage 5: Secondary | ICD-10-CM | POA: Diagnosis not present

## 2024-03-07 NOTE — Telephone Encounter (Signed)
 Copied from CRM [id number]. Topic: Clinical - Home Health Verbal Orders >> Mar 07, 2024  1:36 PM Lizabeth Riggs wrote: Caller/Agency: [name] with [facility] Callback Number: [phone] She has a secured voicemail to leave a message Service Requested: Skilled Nursing Frequency: 1 visit weekly for 8 weeks Any new concerns about the patient? No

## 2024-03-09 DIAGNOSIS — E1122 Type 2 diabetes mellitus with diabetic chronic kidney disease: Secondary | ICD-10-CM | POA: Diagnosis not present

## 2024-03-09 DIAGNOSIS — I1A0 Resistant hypertension: Secondary | ICD-10-CM | POA: Diagnosis not present

## 2024-03-09 DIAGNOSIS — I5033 Acute on chronic diastolic (congestive) heart failure: Secondary | ICD-10-CM | POA: Diagnosis not present

## 2024-03-09 DIAGNOSIS — N185 Chronic kidney disease, stage 5: Secondary | ICD-10-CM | POA: Diagnosis not present

## 2024-03-09 DIAGNOSIS — J439 Emphysema, unspecified: Secondary | ICD-10-CM | POA: Diagnosis not present

## 2024-03-09 DIAGNOSIS — I132 Hypertensive heart and chronic kidney disease with heart failure and with stage 5 chronic kidney disease, or end stage renal disease: Secondary | ICD-10-CM | POA: Diagnosis not present

## 2024-03-11 DIAGNOSIS — E1151 Type 2 diabetes mellitus with diabetic peripheral angiopathy without gangrene: Secondary | ICD-10-CM | POA: Diagnosis not present

## 2024-03-11 DIAGNOSIS — I1A0 Resistant hypertension: Secondary | ICD-10-CM | POA: Diagnosis not present

## 2024-03-11 DIAGNOSIS — E1122 Type 2 diabetes mellitus with diabetic chronic kidney disease: Secondary | ICD-10-CM | POA: Diagnosis not present

## 2024-03-11 DIAGNOSIS — E785 Hyperlipidemia, unspecified: Secondary | ICD-10-CM | POA: Diagnosis not present

## 2024-03-11 DIAGNOSIS — I2510 Atherosclerotic heart disease of native coronary artery without angina pectoris: Secondary | ICD-10-CM | POA: Diagnosis not present

## 2024-03-11 DIAGNOSIS — Z7982 Long term (current) use of aspirin: Secondary | ICD-10-CM | POA: Diagnosis not present

## 2024-03-11 DIAGNOSIS — J439 Emphysema, unspecified: Secondary | ICD-10-CM | POA: Diagnosis not present

## 2024-03-11 DIAGNOSIS — I083 Combined rheumatic disorders of mitral, aortic and tricuspid valves: Secondary | ICD-10-CM | POA: Diagnosis not present

## 2024-03-11 DIAGNOSIS — N185 Chronic kidney disease, stage 5: Secondary | ICD-10-CM | POA: Diagnosis not present

## 2024-03-11 DIAGNOSIS — Z7951 Long term (current) use of inhaled steroids: Secondary | ICD-10-CM | POA: Diagnosis not present

## 2024-03-11 DIAGNOSIS — Z9181 History of falling: Secondary | ICD-10-CM | POA: Diagnosis not present

## 2024-03-11 DIAGNOSIS — I132 Hypertensive heart and chronic kidney disease with heart failure and with stage 5 chronic kidney disease, or end stage renal disease: Secondary | ICD-10-CM | POA: Diagnosis not present

## 2024-03-11 DIAGNOSIS — Z794 Long term (current) use of insulin: Secondary | ICD-10-CM | POA: Diagnosis not present

## 2024-03-11 DIAGNOSIS — Z8673 Personal history of transient ischemic attack (TIA), and cerebral infarction without residual deficits: Secondary | ICD-10-CM | POA: Diagnosis not present

## 2024-03-11 DIAGNOSIS — I5033 Acute on chronic diastolic (congestive) heart failure: Secondary | ICD-10-CM | POA: Diagnosis not present

## 2024-03-11 DIAGNOSIS — E1169 Type 2 diabetes mellitus with other specified complication: Secondary | ICD-10-CM | POA: Diagnosis not present

## 2024-03-12 ENCOUNTER — Other Ambulatory Visit: Payer: Self-pay | Admitting: Nurse Practitioner

## 2024-03-12 ENCOUNTER — Telehealth: Payer: Self-pay

## 2024-03-12 DIAGNOSIS — I5033 Acute on chronic diastolic (congestive) heart failure: Secondary | ICD-10-CM | POA: Diagnosis not present

## 2024-03-12 DIAGNOSIS — N185 Chronic kidney disease, stage 5: Secondary | ICD-10-CM | POA: Diagnosis not present

## 2024-03-12 DIAGNOSIS — I1A0 Resistant hypertension: Secondary | ICD-10-CM | POA: Diagnosis not present

## 2024-03-12 DIAGNOSIS — J439 Emphysema, unspecified: Secondary | ICD-10-CM | POA: Diagnosis not present

## 2024-03-12 DIAGNOSIS — I132 Hypertensive heart and chronic kidney disease with heart failure and with stage 5 chronic kidney disease, or end stage renal disease: Secondary | ICD-10-CM | POA: Diagnosis not present

## 2024-03-12 DIAGNOSIS — E1122 Type 2 diabetes mellitus with diabetic chronic kidney disease: Secondary | ICD-10-CM | POA: Diagnosis not present

## 2024-03-12 NOTE — Telephone Encounter (Signed)
 Copied from CRM [id number]. Topic: Clinical - Medication Question >> Mar 12, 2024 10:14 AM Willma SAUNDERS wrote: Reason for CRM: [facility] calling to notify of medication interactions  1st interaction: Level 2 between hydrOXYzine  (ATARAX ) 25 MG tablet interacts Postassuim .  2nd interation: meclizine  (ANTIVERT ) 25 MG tablet interacts with Postassuim  [name] can be reached at [phone]

## 2024-03-14 NOTE — Telephone Encounter (Signed)
 [name] was advised. KH

## 2024-03-18 NOTE — Progress Notes (Unsigned)
 Cardiology Office Note    Date:  03/20/2024  ID:  Karen, [name] 05-Mar-1946, MRN [id number] PCP:  Karen Thomes SAUNDERS, FNP  Cardiologist:  Karen Schilling, MD  Electrophysiologist:  None   Chief Complaint: Follow up for palpitations   History of Present Illness: [name] is a 78 y.o. female with visit-pertinent history of coronary artery calcification noted on CT, aortic atherosclerosis, chronic diastolic heart failure, PSVT, hypertension, hyperlipidemia, PVD, carotid artery stenosis, brain aneurysm, CVA, type 2 diabetes, ESRD, COPD, pulmonary nodules, tobacco use and anxiety.  She has evidence of coronary artery calcification, aortic atherosclerosis on prior CTs.  Lexiscan  Myoview  in 2020 was negative for ischemia.  Cardiac monitor in 2021 showed rare runs of SVT, no sustained arrhythmias.  She was hospitalized in December 2023 in setting of hypertensive urgency, chest pain, shortness of breath.  She also noted dizziness and blurry vision.  MRI showed no acute abnormalities.  Troponin was mildly elevated with a flat trend.  Echocardiogram at the time showed EF 60 to 65%, normal LV function, no RWMA, moderate LVH, G1 DD, normal RV, aortic sclerosis without evidence of stenosis.  She has a history of peripheral vascular disease s/p left superficial femoral artery stenting in 2012 with early ISR, s/p balloon angioplasty which ultimately failed.  She follows with vascular surgery.  She underwent left femoral to below-knee popliteal artery bypass graft with vein in March 2023.  She developed distal anastomotic stenosis which required intervention on 03/09/2022.  She underwent drug-coated balloon angioplasty to left femoral to popliteal bypass graft in June 2024.  At office visit on 03/31/2019 for noted several concerns including elevated BP, shortness of breath at rest and with exertion, palpitations, intermittent chest tightness as well as noncardiac symptoms including tremors, involuntary  movements, headaches, blurred vision, itching, swelling in her hands, face and legs as well as leg cramps.  She was started on low-dose carvedilol  and hydralazine .  She was referred to hypertension clinic Pharm.D.  Ischemic evaluation was deferred.  She underwent right brachial cephalic fistula placement in early 2025 in preparation for hemodialysis.  In April 2025 she was admitted in setting of resistant hypertension, acute on chronic diastolic heart failure.  Troponin was elevated and cardiology was consulted.  She was diuresed with IV Lasix .  Echocardiogram showed EF 60 to 65%, mild LVH, normal RV function, no significant valvular abnormalities.  Ischemic evaluation was deferred in setting of CKD, patient's wish to avoid dialysis.  She was discharged home in stable condition.  She underwent revision of right brachiocephalic fistula, elevation and branch ligation on 02/01/2024 per vascular surgery.  Patient was last in clinic on 02/07/2024 for follow-up. Per notes she remained stable from a cardiac standpoint, she continued to note shortness of breath with minimal exertion, intermittent chest heaviness as well as intermittent palpitations.  It was noted the patient was taking carvedilol  with Trelegy, on review with Pharm.D. she was switched to metoprolol  given interaction between carvedilol  and Trelegy.  Today patient presents for follow-up.  She reports from a heart standpoint she is doing well, she denies any further chest tightness, reports that her breathing has significantly improved and she feels her palpitations have resolved.  She notes that her biggest complaint is significant leg pain, worse at her hips and at her knees with associated cramping.  She notes that she is unable to walk for significant pain.  She also endorses increased dizziness that started this morning.  Patient notes that  she does have a history of PAD, arthritis and vertigo which she attributes her symptoms to.  During visit patient  reported that she had lab work completed yesterday by nephrology.  On review of lab work her creatinine increased to 8.21, BUN 84, potassium 3.0, phosphorus 7.6, albumin  3.3.  ROS: .   Today she denies chest pain, shortness of breath, lower extremity edema, fatigue, palpitations, melena, hematuria, hemoptysis, diaphoresis, syncope, orthopnea, and PND.  All other systems are reviewed and otherwise negative. Studies Reviewed: SABRA   EKG:  EKG is not ordered today.  CV Studies: Cardiac studies reviewed are outlined and summarized above. Otherwise please see EMR for full report. Cardiac Studies & Procedures   ______________________________________________________________________________________________   STRESS TESTS  MYOCARDIAL PERFUSION IMAGING 11/23/2018  Narrative  Nuclear stress EF: 56%.  The left ventricular ejection fraction is normal (55-65%).  There was no ST segment deviation noted during stress.  The study is normal.  This is a low risk study.  Normal stress nuclear study with no ischemia or infarction.  Gated ejection fraction 56% with normal wall motion.   ECHOCARDIOGRAM  ECHOCARDIOGRAM COMPLETE 01/04/2024  Narrative ECHOCARDIOGRAM REPORT    Patient Name:   [name] Date of Exam: 01/04/2024 Medical Rec #:  [id number]      Height:       66.0 in Accession #:    [id number]     Weight:       197.0 lb Date of Birth:  June 16, 1946       BSA:          1.987 m Patient Age:    78 years       BP:           205/91 mmHg Patient Gender: F              HR:           66 bpm. Exam Location:  Inpatient  Procedure: 2D Echo, Cardiac Doppler and Color Doppler (Both Spectral and Color Flow Doppler were utilized during procedure).  Indications:    CHF  History:        Patient has no prior history of Echocardiogram examinations. CHF, PAD and Stroke; Risk Factors:Hypertension and Diabetes.  Sonographer:    Carmelita Hartshorn RDCS, FE, PE Referring Phys: [id number] VASUNDHRA  RATHORE  IMPRESSIONS   1. Left ventricular ejection fraction, by estimation, is 60 to 65%. The left ventricle has normal function. The left ventricle has no regional wall motion abnormalities. There is mild concentric left ventricular hypertrophy. Left ventricular diastolic parameters are consistent with Grade I diastolic dysfunction (impaired relaxation). 2. Right ventricular systolic function is normal. The right ventricular size is normal. 3. The mitral valve is normal in structure. Trivial mitral valve regurgitation. No evidence of mitral stenosis. 4. The aortic valve is tricuspid. Aortic valve regurgitation is not visualized. Aortic valve sclerosis is present, with no evidence of aortic valve stenosis. 5. The inferior vena cava is normal in size with greater than 50% respiratory variability, suggesting right atrial pressure of 3 mmHg.  FINDINGS Left Ventricle: Left ventricular ejection fraction, by estimation, is 60 to 65%. The left ventricle has normal function. The left ventricle has no regional wall motion abnormalities. The left ventricular internal cavity size was normal in size. There is mild concentric left ventricular hypertrophy. Left ventricular diastolic parameters are consistent with Grade I diastolic dysfunction (impaired relaxation).  Right Ventricle: The right ventricular size is normal. No increase in right ventricular wall thickness.  Right ventricular systolic function is normal.  Left Atrium: Left atrial size was normal in size.  Right Atrium: Right atrial size was normal in size.  Pericardium: There is no evidence of pericardial effusion.  Mitral Valve: The mitral valve is normal in structure. Mild mitral annular calcification. Trivial mitral valve regurgitation. No evidence of mitral valve stenosis.  Tricuspid Valve: The tricuspid valve is normal in structure. Tricuspid valve regurgitation is trivial. No evidence of tricuspid stenosis.  Aortic Valve: The aortic  valve is tricuspid. Aortic valve regurgitation is not visualized. Aortic valve sclerosis is present, with no evidence of aortic valve stenosis. Aortic valve mean gradient measures 3.0 mmHg. Aortic valve peak gradient measures 6.1 mmHg. Aortic valve area, by VTI measures 3.62 cm.  Pulmonic Valve: The pulmonic valve was normal in structure. Pulmonic valve regurgitation is not visualized. No evidence of pulmonic stenosis.  Aorta: The aortic root is normal in size and structure.  Venous: The inferior vena cava is normal in size with greater than 50% respiratory variability, suggesting right atrial pressure of 3 mmHg.  IAS/Shunts: No atrial level shunt detected by color flow Doppler.   LEFT VENTRICLE PLAX 2D LVIDd:         4.60 cm   Diastology LVIDs:         3.10 cm   LV e' medial:    6.22 cm/s LV PW:         1.20 cm   LV E/e' medial:  12.5 LV IVS:        1.20 cm   LV e' lateral:   7.93 cm/s LVOT diam:     2.30 cm   LV E/e' lateral: 9.8 LV SV:         109 LV SV Index:   55 LVOT Area:     4.15 cm   RIGHT VENTRICLE RV S prime:     12.40 cm/s TAPSE (M-mode): 1.6 cm  LEFT ATRIUM             Index        RIGHT ATRIUM           Index LA diam:        4.00 cm 2.01 cm/m   RA Area:     13.40 cm LA Vol (A2C):   53.2 ml 26.77 ml/m  RA Volume:   33.70 ml  16.96 ml/m LA Vol (A4C):   39.4 ml 19.83 ml/m LA Biplane Vol: 48.6 ml 24.46 ml/m AORTIC VALVE AV Area (Vmax):    3.82 cm AV Area (Vmean):   3.72 cm AV Area (VTI):     3.62 cm AV Vmax:           123.00 cm/s AV Vmean:          82.500 cm/s AV VTI:            0.301 m AV Peak Grad:      6.1 mmHg AV Mean Grad:      3.0 mmHg LVOT Vmax:         113.00 cm/s LVOT Vmean:        73.900 cm/s LVOT VTI:          0.262 m LVOT/AV VTI ratio: 0.87  AORTA Ao Root diam: 3.00 cm Ao Asc diam:  2.80 cm  MITRAL VALVE                TRICUSPID VALVE MV Area (PHT): 2.87 cm     TR Peak grad:   5.1  mmHg MV Decel Time: 264 msec     TR Vmax:         113.00 cm/s MV E velocity: 77.80 cm/s MV A velocity: 126.00 cm/s  SHUNTS MV E/A ratio:  0.62         Systemic VTI:  0.26 m Systemic Diam: 2.30 cm  Morene Brownie Electronically signed by Morene Brownie Signature Date/Time: 01/04/2024/4:38:54 PM    Final    MONITORS  LONG TERM MONITOR XT (3-14 DAYS) 04/25/2023  Narrative Predominant rhythm was normal sinus Infrequent runs of non sustained ventricular tachycardia with the longest run being 7 beats Occasional runs of supraventricular tachycardia longest being 16 beats. No sustained arrhythmias       ______________________________________________________________________________________________       Current Reported Medications:.    Current Meds  Medication Sig   acetaminophen  (TYLENOL ) 500 MG tablet Take 1,000 mg by mouth 2 (two) times daily as needed for mild pain (pain score 1-3).   amLODipine  (NORVASC ) 10 MG tablet Take 1 tablet (10 mg total) by mouth daily.   aspirin  EC 81 MG tablet Take 1 tablet (81 mg total) by mouth daily. Swallow whole.   busPIRone  (BUSPAR ) 5 MG tablet Take 1 tablet (5 mg total) by mouth 2 (two) times daily.   calcitRIOL  (ROCALTROL ) 0.25 MCG capsule Take 0.25 mcg by mouth in the morning.   cloNIDine  (CATAPRES ) 0.1 MG tablet Take 1 tablet (0.1 mg total) by mouth at bedtime.   Fluticasone -Umeclidin-Vilant (TRELEGY ELLIPTA ) 200-62.5-25 MCG/ACT AEPB Inhale 1 puff into the lungs daily.   furosemide  (LASIX ) 40 MG tablet Take 80 mg by mouth 2 (two) times daily.   hydrALAZINE  (APRESOLINE ) 100 MG tablet Take 1 tablet (100 mg total) by mouth every 8 (eight) hours.   hydrOXYzine  (ATARAX ) 25 MG tablet Take 1 tablet (25 mg total) by mouth every 8 (eight) hours as needed for anxiety.   insulin  aspart (NOVOLOG  FLEXPEN) 100 UNIT/ML FlexPen Inject 10-14 Units into the skin 3 (three) times daily before meals.   insulin  degludec (TRESIBA  FLEXTOUCH) 200 UNIT/ML FlexTouch Pen Inject 40 Units into the skin daily.    isosorbide  mononitrate (IMDUR ) 60 MG 24 hr tablet Take 1 tablet (60 mg total) by mouth daily.   meclizine  (ANTIVERT ) 25 MG tablet 1/2 tab up to 3 times daily for motion sickness/dizziness.   metolazone (ZAROXOLYN) 5 MG tablet Take 5 mg by mouth every other day.   metoprolol  tartrate (LOPRESSOR ) 25 MG tablet Take 1 tablet (25 mg total) by mouth 2 (two) times daily.   Omega-3 Fatty Acids (FISH OIL) 1000 MG CAPS Take 1,000 mg by mouth in the morning and at bedtime.   Potassium Chloride  ER 20 MEQ TBCR Take 1 tablet by mouth 3 (three) times a week.   rOPINIRole  (REQUIP ) 0.25 MG tablet Take 1 tablet (0.25 mg total) by mouth at bedtime. Take 0.25 mg by mouth at bedtime.   rosuvastatin  (CRESTOR ) 10 MG tablet Take 1 tablet (10 mg total) by mouth daily.   [DISCONTINUED] rosuvastatin  (CRESTOR ) 40 MG tablet Take 1 tablet (40 mg total) by mouth daily.    Physical Exam:    VS:  BP 114/66   Pulse (!) 57   Ht 5' 6 (1.676 m)   Wt 179 lb (81.2 kg)   SpO2 97%   BMI 28.89 kg/m    Wt Readings from Last 3 Encounters:  03/20/24 178 lb 9.2 oz (81 kg)  03/20/24 179 lb (81.2 kg)  03/05/24 183 lb 9.6 oz (83.3 kg)  GEN: Well nourished, well developed in no acute distress NECK: No JVD; No carotid bruits CARDIAC: RRR, no murmurs, rubs, gallops RESPIRATORY:  Clear to auscultation without rales, wheezing or rhonchi  ABDOMEN: Soft, non-tender, non-distended EXTREMITIES:  No edema; No acute deformity     Asessement and Plan:.    ESRD: Patient has been following with nephrology, she has not yet started on dialysis.  During office visit today she reported significantly worsened lower extremity pain, reports that her hips and her knees are hurting so bad she felt she was unable to walk also reports increased muscle cramping.  She also endorsed increased dizziness today.  Patient reported that she had lab work completed yesterday by her nephrologist, had not yet heard results.  Able to find results in [facility] which  indicated her creatinine had increased from 4.79 on 02/03/2024 to 8.21 today, EGFR 5, BUN 84, sodium 136, potassium 3.0, chloride 85, phosphorus 7.6, albumin  3.3.  Reviewed with Dr. Pietro, DOD today at [facility] office, he agreed with recommendation the patient should be evaluated in the emergency department given symptoms in setting of worsening renal function.  Shortness of breath/Coronary artery calcifications on CT/chronic diastolic HF: Patient noted to have coronary calcifications on CT from 2024.  Patient with elevated troponins in April, cardiac catheterization considered at that time however deferred as patient was trying to avoid dialysis.  Today she reports that she has not had any chest pain, reports that her breathing has significantly improved.  Continue aspirin  81 mg daily, amlodipine  10 mg daily, clonidine  0.1 mg nightly, hydralazine  100 mg every 8 hours, Imdur  60 mg daily, Toprol  tartrate 25 mg twice daily.  Hypertension: Blood pressure today 114/66.  Continue current antihypertensive regimen.  Palpitations/PSVT: Cardiac monitor in May 2021 showed rare runs of SVT, no sustained arrhythmias.  Cardiac monitor 04/2023 revealed predominantly sinus rhythm, infrequent NSVT, longest run lasting 7 beats, occasional SVT, longest run lasting 16 beats, no significant sustained arrhythmias.  Patient reports that her palpitations have resolved with transitioning of carvedilol  to metoprolol .  Continue metoprolol .   PVD/carotid artery stenosis: S/p left superficial femoral artery stenting in 2012 with early ISR, s/p balloon angioplasty which ultimately failed.  In June 2024 she underwent drug-coated balloon angioplasty to the left femoral to popliteal bypass graft.  Carotid ultrasound in 02/2023 revealed 40 to 59% right ICA stenosis, 1 to 39% LICA stenosis.  She follows with vascular surgery.  Today patient reports increased lower extremity pain, notes hip and knee pain as well as a cramping sensation  in her legs.  She questioned if it was related to her history of PAD however please see ESRD note above.  Hyperlipidemia: Patient is currently listed as taking rosuvastatin  40 mg daily, given ESRD we will reduce to rosuvastatin  10 mg daily.  Patient reports history of myalgias on atorvastatin .   Disposition: Patient to present to the ED for further evaluation. F/u with Damien Braver, NP or Wilfred Dayrit, NP in 6-8 weeks.   Signed, Kennesha Brewbaker D [place] Stenberg, NP

## 2024-03-19 ENCOUNTER — Encounter (HOSPITAL_BASED_OUTPATIENT_CLINIC_OR_DEPARTMENT_OTHER): Payer: Self-pay

## 2024-03-19 DIAGNOSIS — N184 Chronic kidney disease, stage 4 (severe): Secondary | ICD-10-CM | POA: Diagnosis not present

## 2024-03-20 ENCOUNTER — Ambulatory Visit (INDEPENDENT_AMBULATORY_CARE_PROVIDER_SITE_OTHER): Admitting: Cardiology

## 2024-03-20 ENCOUNTER — Observation Stay (HOSPITAL_COMMUNITY)
Admission: EM | Admit: 2024-03-20 | Discharge: 2024-03-21 | Disposition: A | Discharge status: Home / Self Care | Attending: Internal Medicine | Admitting: Internal Medicine

## 2024-03-20 ENCOUNTER — Other Ambulatory Visit: Payer: Self-pay

## 2024-03-20 ENCOUNTER — Encounter: Payer: Self-pay | Admitting: Cardiology

## 2024-03-20 VITALS — BP 114/66 | HR 57 | Ht 66.0 in | Wt 179.0 lb

## 2024-03-20 DIAGNOSIS — K219 Gastro-esophageal reflux disease without esophagitis: Secondary | ICD-10-CM | POA: Diagnosis not present

## 2024-03-20 DIAGNOSIS — I129 Hypertensive chronic kidney disease with stage 1 through stage 4 chronic kidney disease, or unspecified chronic kidney disease: Secondary | ICD-10-CM | POA: Diagnosis not present

## 2024-03-20 DIAGNOSIS — R002 Palpitations: Secondary | ICD-10-CM | POA: Insufficient documentation

## 2024-03-20 DIAGNOSIS — N186 End stage renal disease: Secondary | ICD-10-CM | POA: Diagnosis not present

## 2024-03-20 DIAGNOSIS — I6523 Occlusion and stenosis of bilateral carotid arteries: Secondary | ICD-10-CM | POA: Insufficient documentation

## 2024-03-20 DIAGNOSIS — Z794 Long term (current) use of insulin: Secondary | ICD-10-CM | POA: Diagnosis not present

## 2024-03-20 DIAGNOSIS — I10 Essential (primary) hypertension: Secondary | ICD-10-CM

## 2024-03-20 DIAGNOSIS — J449 Chronic obstructive pulmonary disease, unspecified: Secondary | ICD-10-CM | POA: Diagnosis not present

## 2024-03-20 DIAGNOSIS — I2510 Atherosclerotic heart disease of native coronary artery without angina pectoris: Secondary | ICD-10-CM | POA: Insufficient documentation

## 2024-03-20 DIAGNOSIS — E785 Hyperlipidemia, unspecified: Secondary | ICD-10-CM | POA: Diagnosis not present

## 2024-03-20 DIAGNOSIS — I700 Atherosclerosis of aorta: Secondary | ICD-10-CM | POA: Diagnosis not present

## 2024-03-20 DIAGNOSIS — N179 Acute kidney failure, unspecified: Principal | ICD-10-CM | POA: Insufficient documentation

## 2024-03-20 DIAGNOSIS — I739 Peripheral vascular disease, unspecified: Secondary | ICD-10-CM | POA: Insufficient documentation

## 2024-03-20 DIAGNOSIS — F3341 Major depressive disorder, recurrent, in partial remission: Secondary | ICD-10-CM | POA: Insufficient documentation

## 2024-03-20 DIAGNOSIS — Z79899 Other long term (current) drug therapy: Secondary | ICD-10-CM | POA: Diagnosis not present

## 2024-03-20 DIAGNOSIS — E1140 Type 2 diabetes mellitus with diabetic neuropathy, unspecified: Secondary | ICD-10-CM | POA: Insufficient documentation

## 2024-03-20 DIAGNOSIS — N189 Chronic kidney disease, unspecified: Secondary | ICD-10-CM | POA: Diagnosis not present

## 2024-03-20 DIAGNOSIS — Z720 Tobacco use: Secondary | ICD-10-CM

## 2024-03-20 DIAGNOSIS — E1169 Type 2 diabetes mellitus with other specified complication: Secondary | ICD-10-CM | POA: Diagnosis not present

## 2024-03-20 DIAGNOSIS — N185 Chronic kidney disease, stage 5: Secondary | ICD-10-CM | POA: Diagnosis not present

## 2024-03-20 DIAGNOSIS — F411 Generalized anxiety disorder: Secondary | ICD-10-CM | POA: Diagnosis not present

## 2024-03-20 DIAGNOSIS — E876 Hypokalemia: Secondary | ICD-10-CM | POA: Diagnosis not present

## 2024-03-20 DIAGNOSIS — R5383 Other fatigue: Secondary | ICD-10-CM | POA: Diagnosis not present

## 2024-03-20 DIAGNOSIS — I120 Hypertensive chronic kidney disease with stage 5 chronic kidney disease or end stage renal disease: Secondary | ICD-10-CM | POA: Diagnosis not present

## 2024-03-20 DIAGNOSIS — Z7982 Long term (current) use of aspirin: Secondary | ICD-10-CM | POA: Diagnosis not present

## 2024-03-20 DIAGNOSIS — R7989 Other specified abnormal findings of blood chemistry: Secondary | ICD-10-CM

## 2024-03-20 DIAGNOSIS — Z8673 Personal history of transient ischemic attack (TIA), and cerebral infarction without residual deficits: Secondary | ICD-10-CM | POA: Insufficient documentation

## 2024-03-20 DIAGNOSIS — R799 Abnormal finding of blood chemistry, unspecified: Secondary | ICD-10-CM | POA: Diagnosis present

## 2024-03-20 DIAGNOSIS — R748 Abnormal levels of other serum enzymes: Secondary | ICD-10-CM | POA: Diagnosis not present

## 2024-03-20 DIAGNOSIS — R0602 Shortness of breath: Secondary | ICD-10-CM | POA: Diagnosis not present

## 2024-03-20 DIAGNOSIS — E1351 Other specified diabetes mellitus with diabetic peripheral angiopathy without gangrene: Secondary | ICD-10-CM | POA: Diagnosis present

## 2024-03-20 LAB — COMPREHENSIVE METABOLIC PANEL WITH GFR
ALT: 20 U/L (ref 0–44)
AST: 32 U/L (ref 15–41)
Albumin: 2.9 g/dL — ABNORMAL LOW (ref 3.5–5.0)
Alkaline Phosphatase: 67 U/L (ref 38–126)
Anion gap: 17 — ABNORMAL HIGH (ref 5–15)
BUN: 91 mg/dL — ABNORMAL HIGH (ref 8–23)
CO2: 29 mmol/L (ref 22–32)
Calcium: 8.5 mg/dL — ABNORMAL LOW (ref 8.9–10.3)
Chloride: 88 mmol/L — ABNORMAL LOW (ref 98–111)
Creatinine, Ser: 8.71 mg/dL — ABNORMAL HIGH (ref 0.44–1.00)
GFR, Estimated: 4 mL/min — ABNORMAL LOW (ref >60)
Glucose, Bld: 344 mg/dL — ABNORMAL HIGH (ref 70–99)
Potassium: 3 mmol/L — ABNORMAL LOW (ref 3.5–5.1)
Sodium: 134 mmol/L — ABNORMAL LOW (ref 135–145)
Total Bilirubin: 0.7 mg/dL (ref 0.0–1.2)
Total Protein: 6.8 g/dL (ref 6.5–8.1)

## 2024-03-20 LAB — CBC WITH DIFFERENTIAL/PLATELET
Abs Immature Granulocytes: 0.04 10*3/uL (ref 0.00–0.07)
Basophils Absolute: 0.1 10*3/uL (ref 0.0–0.1)
Basophils Relative: 1 %
Eosinophils Absolute: 0.1 10*3/uL (ref 0.0–0.5)
Eosinophils Relative: 1 %
HCT: 37 % (ref 36.0–46.0)
Hemoglobin: 12 g/dL (ref 12.0–15.0)
Immature Granulocytes: 0 %
Lymphocytes Relative: 19 %
Lymphs Abs: 2.2 10*3/uL (ref 0.7–4.0)
MCH: 32.3 pg (ref 26.0–34.0)
MCHC: 32.4 g/dL (ref 30.0–36.0)
MCV: 99.5 fL (ref 80.0–100.0)
Monocytes Absolute: 1 10*3/uL (ref 0.1–1.0)
Monocytes Relative: 9 %
Neutro Abs: 8 10*3/uL — ABNORMAL HIGH (ref 1.7–7.7)
Neutrophils Relative %: 70 %
Platelets: 273 10*3/uL (ref 150–400)
RBC: 3.72 MIL/uL — ABNORMAL LOW (ref 3.87–5.11)
RDW: 11.9 % (ref 11.5–15.5)
WBC: 11.3 10*3/uL — ABNORMAL HIGH (ref 4.0–10.5)
nRBC: 0 % (ref 0.0–0.2)

## 2024-03-20 LAB — TROPONIN I (HIGH SENSITIVITY)
Troponin I (High Sensitivity): 63 ng/L — ABNORMAL HIGH (ref <18)
Troponin I (High Sensitivity): 66 ng/L — ABNORMAL HIGH (ref <18)

## 2024-03-20 LAB — CK: Total CK: 382 U/L — ABNORMAL HIGH (ref 38–234)

## 2024-03-20 LAB — CBG MONITORING, ED: Glucose-Capillary: 149 mg/dL — ABNORMAL HIGH (ref 70–99)

## 2024-03-20 MED ORDER — ROSUVASTATIN CALCIUM 10 MG PO TABS: 10.0000 mg | ORAL_TABLET | Freq: Every day | ORAL | 3 refills | Status: AC

## 2024-03-20 MED ORDER — INSULIN ASPART 100 UNIT/ML IJ SOLN: 0.0000 [IU] | Freq: Three times a day (TID) | INTRAMUSCULAR | Status: DC

## 2024-03-20 MED ORDER — CALCIUM CARBONATE ANTACID 1250 MG/5ML PO SUSP
500.0000 mg | Freq: Four times a day (QID) | ORAL | Status: DC | PRN
  Filled 2024-03-20: qty 5

## 2024-03-20 MED ORDER — HEPARIN SODIUM (PORCINE) 5000 UNIT/ML IJ SOLN
5000.0000 [IU] | Freq: Three times a day (TID) | INTRAMUSCULAR | Status: DC
  Administered 2024-03-20: 5000 [IU] via SUBCUTANEOUS
  Filled 2024-03-20: qty 1

## 2024-03-20 MED ORDER — ONDANSETRON HCL 4 MG PO TABS: 4.0000 mg | ORAL_TABLET | Freq: Four times a day (QID) | ORAL | Status: DC | PRN

## 2024-03-20 MED ORDER — SORBITOL 70 % SOLN: 30.0000 mL | Status: DC | PRN

## 2024-03-20 MED ORDER — HYDROXYZINE HCL 25 MG PO TABS: 25.0000 mg | ORAL_TABLET | Freq: Three times a day (TID) | ORAL | Status: DC | PRN

## 2024-03-20 MED ORDER — INSULIN ASPART 100 UNIT/ML IJ SOLN: 0.0000 [IU] | Freq: Every day | INTRAMUSCULAR | Status: DC

## 2024-03-20 MED ORDER — ACETAMINOPHEN 325 MG PO TABS: 650.0000 mg | ORAL_TABLET | Freq: Four times a day (QID) | ORAL | Status: DC | PRN

## 2024-03-20 MED ORDER — NEPRO/CARBSTEADY PO LIQD
237.0000 mL | Freq: Three times a day (TID) | ORAL | Status: DC | PRN
  Administered 2024-03-21: 237 mL via ORAL
  Filled 2024-03-20: qty 237

## 2024-03-20 MED ORDER — ACETAMINOPHEN 650 MG RE SUPP: 650.0000 mg | Freq: Four times a day (QID) | RECTAL | Status: DC | PRN

## 2024-03-20 MED ORDER — ONDANSETRON HCL 4 MG/2ML IJ SOLN: 4.0000 mg | Freq: Four times a day (QID) | INTRAMUSCULAR | Status: DC | PRN

## 2024-03-20 MED ORDER — DOCUSATE SODIUM 283 MG RE ENEM: 1.0000 | ENEMA | RECTAL | Status: DC | PRN

## 2024-03-20 MED ORDER — CAMPHOR-MENTHOL 0.5-0.5 % EX LOTN: 1.0000 | TOPICAL_LOTION | Freq: Three times a day (TID) | CUTANEOUS | Status: DC | PRN

## 2024-03-20 NOTE — ED Provider Triage Note (Signed)
 Emergency Medicine Provider Triage Evaluation Note  [name] , a 78 y.o. female  was evaluated in triage.  Pt complains of abnormal labs.  She is ESRD, yet to start dialysis.  Creatinine up to 8.21 today, previously at 4.  She reports that she has been feeling dizzy over the past few days have been feeling short of breath earlier.  Review of Systems  Positive:  Negative:   Physical Exam  BP (!) 147/42   Pulse 67   Temp 98.7 F (37.1 C)   Resp 14   Wt 81 kg   SpO2 93%   BMI 28.82 kg/m  Gen:   Awake, no distress   Resp:  Normal effort  MSK:   Moves extremities without difficulty  Other:    Medical Decision Making  Medically screening exam initiated at 4:01 PM.  Appropriate orders placed.  [name] was informed that the remainder of the evaluation will be completed by another provider, this initial triage assessment does not replace that evaluation, and the importance of remaining in the ED until their evaluation is complete.     Donnajean Lynwood DEL, PA-C 03/20/24 [phone]

## 2024-03-20 NOTE — ED Triage Notes (Signed)
 C/o dizziness since yesterday.  Sent from cardiology office for abnormal labs.    Note from visit--ESRD: Patient has been following with nephrology, she has not yet started on dialysis.  During office visit today she reported significantly worsened lower extremity pain, reports that her hips and her knees are hurting so bad she felt she was unable to walk also reports increased muscle cramping.  She also endorsed increased dizziness today.  Patient reported that she had lab work completed yesterday by her nephrologist, had not yet heard results.  Was able to find results in [facility] which indicated her creatinine had increased from 4.79 on 02/03/2024 to 8.21 today, EGFR 5, BUN 84, sodium 136, potassium 3.0, chloride 85, phosphorus 7.6, albumin  3.3.  Reviewed with Dr. Pietro, DOD today at [facility] office, he agreed with recommendation the patient should be evaluated in the emergency department given symptoms in setting of worsening renal function.

## 2024-03-20 NOTE — ED Provider Notes (Signed)
 [place] EMERGENCY DEPARTMENT AT [facility] Provider Note   CSN: [id number] Arrival date & time: 03/20/24  1352     Patient presents with: Dizziness   [name] is a 78 y.o. female.   78 year old female presents for evaluation of abnormal lab results.  She was at her primary doctor office earlier today.  States she has been feeling more fatigued and had difficulty walking but overall feeling improved and not been having shortness of breath like she was a couple weeks ago.  States she was told her creatinine had doubled, in the last 3 weeks, and she was told to come to the ER.  Denies any other symptoms or concerns at this time.   Dizziness Associated symptoms: no chest pain, no palpitations, no shortness of breath and no vomiting        Prior to Admission medications   Medication Sig Start Date End Date Taking? Authorizing Provider  acetaminophen  (TYLENOL ) 500 MG tablet Take 1,000 mg by mouth 2 (two) times daily as needed for mild pain (pain score 1-3).   Yes [provider]  amLODipine  (NORVASC ) 10 MG tablet Take 1 tablet (10 mg total) by mouth daily. 01/10/24  Yes Arlon Carliss ORN, DO  aspirin  EC 81 MG tablet Take 1 tablet (81 mg total) by mouth daily. Swallow whole. 01/11/24  Yes Arlon Carliss ORN, DO  busPIRone  (BUSPAR ) 5 MG tablet Take 1 tablet (5 mg total) by mouth 2 (two) times daily. 02/01/24  Yes Rhyne, [name] J, PA-C  calcitRIOL  (ROCALTROL ) 0.25 MCG capsule Take 0.25 mcg by mouth in the morning. 11/30/23  Yes [provider]  cloNIDine  (CATAPRES ) 0.1 MG tablet Take 1 tablet (0.1 mg total) by mouth at bedtime. 01/10/24  Yes Arlon Carliss ORN, DO  Fluticasone -Umeclidin-Vilant (TRELEGY ELLIPTA ) 200-62.5-25 MCG/ACT AEPB Inhale 1 puff into the lungs daily. 07/20/23  Yes Mannam, Praveen, MD  furosemide  (LASIX ) 40 MG tablet Take 80 mg by mouth 2 (two) times daily.   Yes [provider]  hydrALAZINE  (APRESOLINE ) 100 MG tablet Take 1 tablet  (100 mg total) by mouth every 8 (eight) hours. 02/16/24  Yes Monge, Damien BROCKS, NP  insulin  aspart (NOVOLOG  FLEXPEN) 100 UNIT/ML FlexPen Inject 10-14 Units into the skin 3 (three) times daily before meals. Patient taking differently: Inject 10-14 Units into the skin 3 (three) times daily before meals. Per sliding scale 03/05/24  Yes Trixie File, MD  insulin  degludec (TRESIBA  FLEXTOUCH) 200 UNIT/ML FlexTouch Pen Inject 40 Units into the skin daily. 01/10/24  Yes Arlon Carliss ORN, DO  isosorbide  mononitrate (IMDUR ) 60 MG 24 hr tablet Take 1 tablet (60 mg total) by mouth daily. 01/11/24  Yes Arlon Carliss ORN, DO  meclizine  (ANTIVERT ) 25 MG tablet 1/2 tab up to 3 times daily for motion sickness/dizziness. Patient taking differently: Take 25 mg by mouth daily as needed for dizziness or nausea. 11/10/21  Yes Jeanine Knee, NP  metolazone (ZAROXOLYN) 5 MG tablet Take 5 mg by mouth every other day.   Yes [provider]  metoprolol  tartrate (LOPRESSOR ) 25 MG tablet Take 1 tablet (25 mg total) by mouth 2 (two) times daily. 02/07/24 05/07/24 Yes Monge, Damien BROCKS, NP  Omega-3 Fatty Acids (FISH OIL) 1000 MG CAPS Take 1,000 mg by mouth in the morning and at bedtime. 11/21/21  Yes Bethanie Cough, PA-C  Potassium Chloride  ER 20 MEQ TBCR Take 1 tablet by mouth 3 (three) times a week. 03/05/24  Yes [provider]  rOPINIRole  (REQUIP ) 0.25  MG tablet Take 1 tablet (0.25 mg total) by mouth at bedtime. Take 0.25 mg by mouth at bedtime. 02/27/24  Yes Paseda, Folashade R, FNP  rosuvastatin  (CRESTOR ) 40 MG tablet Take 40 mg by mouth daily.   Yes [provider]  hydrOXYzine  (ATARAX ) 25 MG tablet Take 1 tablet (25 mg total) by mouth every 8 (eight) hours as needed for anxiety. Patient not taking: Reported on 03/20/2024 02/27/24   Paseda, Folashade R, FNP  rosuvastatin  (CRESTOR ) 10 MG tablet Take 1 tablet (10 mg total) by mouth daily. Patient not taking: Reported on 03/20/2024 03/20/24 06/18/24  West, Katlyn D, NP     Allergies: Ciprofloxacin , Ozempic  (0.25 or 0.5 mg-dose) [semaglutide (0.25 or 0.5mg -dos)], Plavix [clopidogrel bisulfate], Amoxicillin, Ace inhibitors, Atorvastatin , Lyrica [pregabalin], and Penicillins    Review of Systems  Constitutional:  Positive for fatigue. Negative for chills and fever.  HENT:  Negative for ear pain and sore throat.   Eyes:  Negative for pain and visual disturbance.  Respiratory:  Negative for cough and shortness of breath.   Cardiovascular:  Negative for chest pain and palpitations.  Gastrointestinal:  Negative for abdominal pain and vomiting.  Genitourinary:  Negative for dysuria and hematuria.  Musculoskeletal:  Negative for arthralgias and back pain.  Skin:  Negative for color change and rash.  Neurological:  Positive for dizziness. Negative for seizures and syncope.  All other systems reviewed and are negative.   Updated Vital Signs BP (!) 150/50   Pulse 65   Temp 98.5 F (36.9 C) (Oral)   Resp 11   Wt 81 kg   SpO2 97%   BMI 28.82 kg/m   Physical Exam Vitals and nursing note reviewed.  Constitutional:      General: She is not in acute distress.    Appearance: Normal appearance. She is well-developed. She is not ill-appearing.  HENT:     Head: Normocephalic and atraumatic.  Eyes:     Conjunctiva/sclera: Conjunctivae normal.  Cardiovascular:     Rate and Rhythm: Normal rate and regular rhythm.     Pulses: Normal pulses.     Heart sounds: No murmur heard. Pulmonary:     Effort: Pulmonary effort is normal. No respiratory distress.     Breath sounds: Normal breath sounds.  Abdominal:     General: There is no distension.     Palpations: Abdomen is soft. There is no mass.     Tenderness: There is no abdominal tenderness.     Hernia: No hernia is present.  Musculoskeletal:        General: No swelling.     Cervical back: Neck supple.  Skin:    General: Skin is warm and dry.     Capillary Refill: Capillary refill takes less than 2 seconds.   Neurological:     Mental Status: She is alert.  Psychiatric:        Mood and Affect: Mood normal.     (all labs ordered are listed, but only abnormal results are displayed) Labs Reviewed  CBC WITH DIFFERENTIAL/PLATELET - Abnormal; Notable for the following components:      Result Value   WBC 11.3 (*)    RBC 3.72 (*)    Neutro Abs 8.0 (*)    All other components within normal limits  COMPREHENSIVE METABOLIC PANEL WITH GFR - Abnormal; Notable for the following components:   Sodium 134 (*)    Potassium 3.0 (*)    Chloride 88 (*)    Glucose, Bld 344 (*)  BUN 91 (*)    Creatinine, Ser 8.71 (*)    Calcium  8.5 (*)    Albumin  2.9 (*)    GFR, Estimated 4 (*)    Anion gap 17 (*)    All other components within normal limits  CK - Abnormal; Notable for the following components:   Total CK 382 (*)    All other components within normal limits  CBG MONITORING, ED - Abnormal; Notable for the following components:   Glucose-Capillary 149 (*)    All other components within normal limits  TROPONIN I (HIGH SENSITIVITY) - Abnormal; Notable for the following components:   Troponin I (High Sensitivity) 63 (*)    All other components within normal limits  TROPONIN I (HIGH SENSITIVITY) - Abnormal; Notable for the following components:   Troponin I (High Sensitivity) 66 (*)    All other components within normal limits  URINALYSIS, ROUTINE W REFLEX MICROSCOPIC  CBC  CREATININE, SERUM  RENAL FUNCTION PANEL  CBC    EKG: EKG Interpretation Date/Time:  Tuesday March 20 2024 16:15:22 EDT Ventricular Rate:  66 PR Interval:  148 QRS Duration:  96 QT Interval:  482 QTC Calculation: 505 R Axis:   11  Text Interpretation: Normal sinus rhythm Marked ST abnormality, possible inferior subendocardial injury Slightly prolonged QTc Abnormal ECG When compared with ECG of 07-Feb-2024 10:25,  Confirmed by Gennaro Bouchard ([id number]) on 03/20/2024 5:20:40 PM  Radiology: No results found.   Procedures    Medications Ordered in the ED  acetaminophen  (TYLENOL ) tablet 650 mg (has no administration in time range)    Or  acetaminophen  (TYLENOL ) suppository 650 mg (has no administration in time range)  camphor-menthol (SARNA) lotion 1 Application (has no administration in time range)    And  hydrOXYzine  (ATARAX ) tablet 25 mg (has no administration in time range)  sorbitol 70 % solution 30 mL (has no administration in time range)  docusate sodium  (ENEMEEZ) enema 283 mg (has no administration in time range)  ondansetron  (ZOFRAN ) tablet 4 mg (has no administration in time range)    Or  ondansetron  (ZOFRAN ) injection 4 mg (has no administration in time range)  calcium  carbonate (dosed in mg elemental calcium ) suspension 500 mg of elemental calcium  (has no administration in time range)  feeding supplement (NEPRO CARB STEADY) liquid 237 mL (has no administration in time range)  insulin  aspart (novoLOG ) injection 0-6 Units (has no administration in time range)  insulin  aspart (novoLOG ) injection 0-5 Units ( Subcutaneous Not Given 03/20/24 2220)  heparin  injection 5,000 Units (5,000 Units Subcutaneous Given 03/20/24 2240)                                    Medical Decision Making Medical Decision Making Nursing notes are reviewed. Differential diagnosis for this patient would include but not limited to: Acute on chronic renal failure, lab abnormality, dehydration, UTI, other  Records reviewed: Prior records reviewed and patient had a creatinine of 4 in May of this year and had lab work done yesterday that showed a creatinine of 8  Consults: I spoke with Dr. Lizette from nephrology and he will plan to see the patient in the morning.  States no acute invention needed tonight.  EKG interpretation: Interpreted by me in the absence of cardiology and shows sinus rhythm, normal intervals, no T wave changes and no acute abnormality when compared to previous  Cardiac monitor interpretation: Sinus rhythm, no  ectopy  Emergency Department Course:  Vital signs and pulse oximetry are reviewed, evaluated by myself and found to be within normal limits prior to final disposition. Findings of laboratory testing and medical imaging are discussed with patient and family that is available. Patient agrees with the medical care plan as follows:  Patient's lab workup reviewed by me she does have a new acute on chronic kidney failure.  Her troponin and CPK are elevated as well and I think this is likely from the renal failure.  Discussion with nephrology as above.  I discussed patient's case with hospitalist and patient will be admitted for further workup and management.  Patient is agreeable with the plan.  Problems Addressed: Acute renal failure superimposed on chronic kidney disease, unspecified acute renal failure type, unspecified CKD stage [facility]): acute illness or injury that poses a threat to life or bodily functions Elevated CPK: undiagnosed new problem with uncertain prognosis Elevated troponin: undiagnosed new problem with uncertain prognosis  Amount and/or Complexity of Data Reviewed External Data Reviewed: notes. Labs: ordered. Decision-making details documented in ED Course. ECG/medicine tests: ordered and independent interpretation performed. Decision-making details documented in ED Course.  Risk Drug therapy requiring intensive monitoring for toxicity. Decision regarding hospitalization.   Final diagnoses:  Acute renal failure superimposed on chronic kidney disease, unspecified acute renal failure type, unspecified CKD stage (HCC)  Elevated troponin  Elevated CPK    ED Discharge Orders     None          Gennaro Duwaine CROME, DO 03/20/24 2308

## 2024-03-20 NOTE — Patient Instructions (Signed)
 Medication Instructions:  Decrease Rosuvastatin  to  *If you need a refill on your cardiac medications before your next appointment, please call your pharmacy*  Lab Work: No labs If you have labs (blood work) drawn today and your tests are completely normal, you will receive your results only by: MyChart Message (if you have MyChart) OR A paper copy in the mail If you have any lab test that is abnormal or we need to change your treatment, we will call you to review the results.  Testing/Procedures: No testing  Follow-Up: At [facility], you and your health needs are our priority.  As part of our continuing mission to provide you with exceptional heart care, our providers are all part of one team.  This team includes your primary Cardiologist (physician) and Advanced Practice Providers or APPs (Physician Assistants and Nurse Practitioners) who all work together to provide you with the care you need, when you need it.  Your next appointment:   Go to [facility]   We recommend signing up for the patient portal called MyChart.  Sign up information is provided on this After Visit Summary.  MyChart is used to connect with patients for Virtual Visits (Telemedicine).  Patients are able to view lab/test results, encounter notes, upcoming appointments, etc.  Non-urgent messages can be sent to your provider as well.   To learn more about what you can do with MyChart, go to [email or web address].

## 2024-03-20 NOTE — H&P (Signed)
 History and Physical    Patient: [name] FMW:[id number] DOB: [birth year] DOA: 03/20/2024 DOS: the patient was seen and examined on 03/20/2024 PCP: Paseda, Folashade R, FNP  Patient coming from: Home  Chief Complaint:  Chief Complaint  Patient presents with   Dizziness   HPI: [name] is a 78 y.o. female with medical history significant of coronary artery disease, CVA, carotid stenosis, history of brain aneurysm, history of CVA, diabetes non-insulin -dependent type II, chronic kidney disease stage V, COPD, pulmonary nodules, tobacco abuse, anxiety disorder, aortic atherosclerosis, migraine headaches, essential hypertension, hyperlipidemia and peripheral vascular disease who has been progressively having worsening renal function.  Patient is following up with Dr. Dennise as her nephrologist.  Recently has had dialysis access catheter about 2 weeks ago but has not yet started dialysis.  She has been having progressive pain all over especially in her legs buttocks and arms.  This has made it difficult for her to walk.  She is also weak overall.  Patient went to see her cardiologist today.  She had blood work done.  Was told that her statin dose will be reduced and the statin may be causing her generalized muscular pain and weakness.  On review of her labs her creatinine has doubled from 4.6-8.7 since May 14.  Also some hypokalemia.  Her CPK is only 382.  Patient was asked to come to the ER for further evaluation.  In the ER these numbers are confirmed.  Patient is being admitted with acute on chronic kidney disease stage V.  Nephrology is consulted.  Review of Systems: As mentioned in the history of present illness. All other systems reviewed and are negative. 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,  small   Anxiety    Arthritis    Cataract    bilateral - surgery   CKD (chronic  kidney disease), stage IV (HCC)    followed by [facility]   Colon polyps    Depression    Diabetes mellitus 1998   type 2   Diverticulosis    Emphysema of lung (HCC)    no one has evry told me that   ESOPHAGEAL STRICTURE 08/27/2008   Family history of adverse reaction to anesthesia    daughter n/v   GERD (gastroesophageal reflux disease)    Glaucoma    bilateral   Heart failure (HCC) 01/04/2024   Hemorrhoids    hx   Hiatal hernia    Hyperlipidemia    Hypertension 2000   Migraines    Neuropathy    fingers and toes   Peripheral vascular disease (HCC)    Phlebitis    30 years ago  left leg   Stroke [facility])    multiple mini strokes ( brain aneurysm ).  Right side weaker. 2 strokes   Past Surgical History:  Procedure Laterality Date   ABDOMINAL AORTAGRAM N/A 01/04/2012   Procedure: ABDOMINAL AORTAGRAM;  Surgeon: Gaile LELON New, MD;  Location: [facility] CATH LAB;  Service: Cardiovascular;  Laterality: N/A;   ABDOMINAL AORTAGRAM N/A 08/15/2012   Procedure: ABDOMINAL EZELLA;  Surgeon: Gaile LELON New, MD;  Location: [facility] CATH LAB;  Service: Cardiovascular;  Laterality: N/A;   ABDOMINAL AORTOGRAM W/LOWER EXTREMITY Left 11/17/2021   Procedure: ABDOMINAL AORTOGRAM W/LOWER EXTREMITY;  Surgeon: New Gaile LELON, MD;  Location: [facility] INVASIVE CV LAB;  Service: Cardiovascular;  Laterality: Left;  ABDOMINAL AORTOGRAM W/LOWER EXTREMITY N/A 03/09/2022   Procedure: ABDOMINAL AORTOGRAM W/LOWER EXTREMITY;  Surgeon: Serene Gaile ORN, MD;  Location: [facility] INVASIVE CV LAB;  Service: Cardiovascular;  Laterality: N/A;   ABDOMINAL AORTOGRAM W/LOWER EXTREMITY N/A 03/15/2023   Procedure: ABDOMINAL AORTOGRAM W/LOWER EXTREMITY;  Surgeon: Serene Gaile ORN, MD;  Location: [facility] INVASIVE CV LAB;  Service: Cardiovascular;  Laterality: N/A;   ABDOMINAL HYSTERECTOMY  1984   AV FISTULA PLACEMENT Right 12/21/2023   Procedure: RIGHT ARM BRACHIOCEPHALIC ARTERIOVENOUS (AV) FISTULA CREATION;  Surgeon: Serene Gaile ORN, MD;   Location: [facility] OR;  Service: Vascular;  Laterality: Right;   BREAST SURGERY     cataract surgery Right 10/22/2015   cataract surgery Left 11/05/2015   CESAREAN SECTION     CHOLECYSTECTOMY     COLONOSCOPY  07/2010   DENTAL SURGERY  05/05/2012   left lower    ESOPHAGOGASTRODUODENOSCOPY  07/2010   EYE SURGERY  07/2013   Laser-Glaucoma   FEMORAL ARTERY STENT  05/11/2011   Left superficial femoral and popliteal artery   FEMORAL-POPLITEAL BYPASS GRAFT Left 11/18/2021   Procedure: LEFT FEMORAL-POPLITEAL ARTERY BYPASS GRAFT;  Surgeon: Serene Gaile ORN, MD;  Location: [facility] OR;  Service: Vascular;  Laterality: Left;   FISTULA SUPERFICIALIZATION Right 02/01/2024   Procedure: RIGHT AV FISTULA SUPERFICIALIZATION;  Surgeon: Serene Gaile ORN, MD;  Location: [facility] OR;  Service: Vascular;  Laterality: Right;   HERNIA REPAIR     times two   KNEE SURGERY     LOWER EXTREMITY ANGIOGRAM Left 08/15/2012   Procedure: LOWER EXTREMITY ANGIOGRAM;  Surgeon: Gaile ORN Serene, MD;  Location: [facility] CATH LAB;  Service: Cardiovascular;  Laterality: Left;  lt leg angio   PERIPHERAL VASCULAR BALLOON ANGIOPLASTY Left 03/09/2022   Procedure: PERIPHERAL VASCULAR BALLOON ANGIOPLASTY;  Surgeon: Serene Gaile ORN, MD;  Location: [facility] INVASIVE CV LAB;  Service: Cardiovascular;  Laterality: Left;   PERIPHERAL VASCULAR BALLOON ANGIOPLASTY Left 03/15/2023   Procedure: PERIPHERAL VASCULAR BALLOON ANGIOPLASTY;  Surgeon: Serene Gaile ORN, MD;  Location: [facility] INVASIVE CV LAB;  Service: Cardiovascular;  Laterality: Left;   rotator cuff surgery     THYROID  SURGERY     Social History:  reports that she has been smoking cigarettes. She started smoking about 3 months ago. She has a 20.1 pack-year smoking history. She has never been exposed to tobacco smoke. She has never used smokeless tobacco. She reports that she does not drink alcohol  and does not use drugs.  Allergies  Allergen Reactions   Ciprofloxacin  Swelling   Ozempic  (0.25 Or 0.5 Mg-Dose)  [Semaglutide (0.25 Or 0.5mg -Dos)] Nausea And Vomiting   Plavix [Clopidogrel Bisulfate] Palpitations   Amoxicillin Itching and Swelling    FACE & EYES SWELL   Ace Inhibitors Cough    Sore throat   Atorvastatin      myalgias   Lyrica [Pregabalin] Itching and Nausea Only     gain weight   Penicillins Other (See Comments)    Pt unsure if there is an allergic reaction     Family History  Problem Relation Age of Onset   CAD Mother 66       Died of MI   Hypertension Mother    Heart attack Mother    Heart disease Mother    CAD Father 38       Died of MI   Heart disease Father    Heart attack Father    CAD Brother 59       Two brothers died of MI   Heart attack  Brother    Heart disease Brother        Amputation   Diabetes Sister    Hypertension Sister    Heart attack Brother    Heart disease Brother    Stroke Sister    Colon cancer Neg Hx    Stomach cancer Neg Hx    Esophageal cancer Neg Hx     Prior to Admission medications   Medication Sig Start Date End Date Taking? Authorizing Provider  acetaminophen  (TYLENOL ) 500 MG tablet Take 1,000 mg by mouth 2 (two) times daily as needed for mild pain (pain score 1-3).   Yes [provider]  amLODipine  (NORVASC ) 10 MG tablet Take 1 tablet (10 mg total) by mouth daily. 01/10/24  Yes Arlon Carliss ORN, DO  aspirin  EC 81 MG tablet Take 1 tablet (81 mg total) by mouth daily. Swallow whole. 01/11/24  Yes Arlon Carliss ORN, DO  busPIRone  (BUSPAR ) 5 MG tablet Take 1 tablet (5 mg total) by mouth 2 (two) times daily. 02/01/24  Yes Rhyne, [name] J, PA-C  calcitRIOL  (ROCALTROL ) 0.25 MCG capsule Take 0.25 mcg by mouth in the morning. 11/30/23  Yes [provider]  cloNIDine  (CATAPRES ) 0.1 MG tablet Take 1 tablet (0.1 mg total) by mouth at bedtime. 01/10/24  Yes Arlon Carliss ORN, DO  Fluticasone -Umeclidin-Vilant (TRELEGY ELLIPTA ) 200-62.5-25 MCG/ACT AEPB Inhale 1 puff into the lungs daily. 07/20/23  Yes Mannam, Praveen, MD  furosemide   (LASIX ) 40 MG tablet Take 80 mg by mouth 2 (two) times daily.   Yes [provider]  hydrALAZINE  (APRESOLINE ) 100 MG tablet Take 1 tablet (100 mg total) by mouth every 8 (eight) hours. 02/16/24  Yes Monge, Damien BROCKS, NP  insulin  aspart (NOVOLOG  FLEXPEN) 100 UNIT/ML FlexPen Inject 10-14 Units into the skin 3 (three) times daily before meals. 03/05/24  Yes Trixie File, MD  insulin  degludec (TRESIBA  FLEXTOUCH) 200 UNIT/ML FlexTouch Pen Inject 40 Units into the skin daily. 01/10/24  Yes Arlon Carliss ORN, DO  isosorbide  mononitrate (IMDUR ) 60 MG 24 hr tablet Take 1 tablet (60 mg total) by mouth daily. 01/11/24  Yes Arlon Carliss ORN, DO  meclizine  (ANTIVERT ) 25 MG tablet 1/2 tab up to 3 times daily for motion sickness/dizziness. Patient taking differently: Take 25 mg by mouth daily as needed for dizziness or nausea. 11/10/21  Yes Jeanine Knee, NP  metolazone (ZAROXOLYN) 5 MG tablet Take 5 mg by mouth every other day.   Yes [provider]  metoprolol  tartrate (LOPRESSOR ) 25 MG tablet Take 1 tablet (25 mg total) by mouth 2 (two) times daily. 02/07/24 05/07/24 Yes Monge, Damien BROCKS, NP  Omega-3 Fatty Acids (FISH OIL) 1000 MG CAPS Take 1,000 mg by mouth in the morning and at bedtime. 11/21/21  Yes Bethanie Cough, PA-C  Potassium Chloride  ER 20 MEQ TBCR Take 1 tablet by mouth 3 (three) times a week. 03/05/24  Yes [provider]  rOPINIRole  (REQUIP ) 0.25 MG tablet Take 1 tablet (0.25 mg total) by mouth at bedtime. Take 0.25 mg by mouth at bedtime. 02/27/24  Yes Paseda, Folashade R, FNP  rosuvastatin  (CRESTOR ) 40 MG tablet Take 40 mg by mouth daily.   Yes [provider]  hydrOXYzine  (ATARAX ) 25 MG tablet Take 1 tablet (25 mg total) by mouth every 8 (eight) hours as needed for anxiety. Patient not taking: Reported on 03/20/2024 02/27/24   Paseda, Folashade R, FNP  rosuvastatin  (CRESTOR ) 10 MG tablet Take 1 tablet (10 mg total) by mouth daily. Patient not taking: Reported on  03/20/2024  03/20/24 06/18/24  West, Katlyn D, NP    Physical Exam: Vitals:   03/20/24 1356 03/20/24 1551 03/20/24 1727 03/20/24 1830  BP: (!) 147/42  (!) 170/40 (!) 150/50  Pulse: 67  64 65  Resp: 14  20 11   Temp: 98.7 F (37.1 C)  98.5 F (36.9 C)   TempSrc:   Oral   SpO2: 93%  97% 97%  Weight:  81 kg     Constitutional: Acutely ill looking, NAD, calm, comfortable Eyes: PERRL, lids and conjunctivae normal ENMT: Mucous membranes are moist. Posterior pharynx clear of any exudate or lesions.Normal dentition.  Neck: normal, supple, no masses, no thyromegaly Respiratory: clear to auscultation bilaterally, no wheezing, no crackles. Normal respiratory effort. No accessory muscle use.  Cardiovascular: Regular rate and rhythm, no murmurs / rubs / gallops. No extremity edema. 2+ pedal pulses. No carotid bruits.  Abdomen: no tenderness, no masses palpated. No hepatosplenomegaly. Bowel sounds positive.  Musculoskeletal: Good range of motion, no joint swelling or tenderness, Skin: no rashes, lesions, ulcers. No induration Neurologic: CN 2-12 grossly intact. Sensation intact, DTR normal. Strength 5/5 in all 4.  Psychiatric: Normal judgment and insight. Alert and oriented x 3. Normal mood  Data Reviewed:  Blood pressure 147/42, sodium 134 potassium 3.0, chloride 88, glucose 344 BUN 91 creatinine 8.71 calcium  8.5 with GFR of 10.  White count is 11.3 hemoglobin 12.0.  Assessment and Plan:  #1 acute on chronic kidney disease stage V: Appears to be end-stage renal disease.  Patient already being prepped for hemodialysis.  She is on high-dose Lasix .  Patient will be admitted.  Will consult nephrology.  Will follow their recommendations.  #2 essential hypertension: Will continue home blood pressure medications.  #3 type 2 diabetes: Sliding scale insulin .  Continue to monitor  #4 coronary artery disease: No acute decompensation.  Continue home regimen  #5 COPD: No acute exacerbation.  #6 generalized anxiety  disorder: Continue home regimen  #7 peripheral vascular disease: Stable.  Continue home regimen  #8 hyperlipidemia: Patient appears to have statin intolerance.  Dose has been reduced from 40 mg to 10 mg today.  Will hold off statin for now she can start at the lower dose after discharge.  #9 GERD: Continue with PPIs    Advance Care Planning:   Code Status: Full Code   Consults: Nephrology Dr. Melia  Family Communication: No family at bedside  Severity of Illness: The appropriate patient status for this patient is INPATIENT. Inpatient status is judged to be reasonable and necessary in order to provide the required intensity of service to ensure the patient's safety. The patient's presenting symptoms, physical exam findings, and initial radiographic and laboratory data in the context of their chronic comorbidities is felt to place them at high risk for further clinical deterioration. Furthermore, it is not anticipated that the patient will be medically stable for discharge from the hospital within 2 midnights of admission.   * I certify that at the point of admission it is my clinical judgment that the patient will require inpatient hospital care spanning beyond 2 midnights from the point of admission due to high intensity of service, high risk for further deterioration and high frequency of surveillance required.*  AuthorBETHA SIM KNOLL, MD 03/20/2024 8:04 PM  For on call review [email or web address].

## 2024-03-21 ENCOUNTER — Encounter (HOSPITAL_COMMUNITY): Payer: Self-pay | Admitting: Internal Medicine

## 2024-03-21 DIAGNOSIS — N178 Other acute kidney failure: Secondary | ICD-10-CM | POA: Diagnosis not present

## 2024-03-21 DIAGNOSIS — E876 Hypokalemia: Secondary | ICD-10-CM | POA: Diagnosis not present

## 2024-03-21 DIAGNOSIS — N179 Acute kidney failure, unspecified: Secondary | ICD-10-CM | POA: Diagnosis not present

## 2024-03-21 DIAGNOSIS — D649 Anemia, unspecified: Secondary | ICD-10-CM | POA: Diagnosis not present

## 2024-03-21 DIAGNOSIS — I120 Hypertensive chronic kidney disease with stage 5 chronic kidney disease or end stage renal disease: Secondary | ICD-10-CM | POA: Diagnosis not present

## 2024-03-21 DIAGNOSIS — N185 Chronic kidney disease, stage 5: Secondary | ICD-10-CM | POA: Diagnosis not present

## 2024-03-21 DIAGNOSIS — J449 Chronic obstructive pulmonary disease, unspecified: Secondary | ICD-10-CM | POA: Diagnosis not present

## 2024-03-21 LAB — CBC
HCT: 32.2 % — ABNORMAL LOW (ref 36.0–46.0)
Hemoglobin: 10.7 g/dL — ABNORMAL LOW (ref 12.0–15.0)
MCH: 32.5 pg (ref 26.0–34.0)
MCHC: 33.2 g/dL (ref 30.0–36.0)
MCV: 97.9 fL (ref 80.0–100.0)
Platelets: 237 10*3/uL (ref 150–400)
RBC: 3.29 MIL/uL — ABNORMAL LOW (ref 3.87–5.11)
RDW: 12.1 % (ref 11.5–15.5)
WBC: 10.2 10*3/uL (ref 4.0–10.5)
nRBC: 0.2 % (ref 0.0–0.2)

## 2024-03-21 LAB — RENAL FUNCTION PANEL
Albumin: 2.5 g/dL — ABNORMAL LOW (ref 3.5–5.0)
Anion gap: 18 — ABNORMAL HIGH (ref 5–15)
BUN: 90 mg/dL — ABNORMAL HIGH (ref 8–23)
CO2: 29 mmol/L (ref 22–32)
Calcium: 8.2 mg/dL — ABNORMAL LOW (ref 8.9–10.3)
Chloride: 88 mmol/L — ABNORMAL LOW (ref 98–111)
Creatinine, Ser: 8.48 mg/dL — ABNORMAL HIGH (ref 0.44–1.00)
GFR, Estimated: 4 mL/min — ABNORMAL LOW (ref >60)
Glucose, Bld: 301 mg/dL — ABNORMAL HIGH (ref 70–99)
Phosphorus: 7.8 mg/dL — ABNORMAL HIGH (ref 2.5–4.6)
Potassium: 2.5 mmol/L — CL (ref 3.5–5.1)
Sodium: 135 mmol/L (ref 135–145)

## 2024-03-21 LAB — GLUCOSE, CAPILLARY
Glucose-Capillary: 182 mg/dL — ABNORMAL HIGH (ref 70–99)
Glucose-Capillary: 292 mg/dL — ABNORMAL HIGH (ref 70–99)

## 2024-03-21 MED ORDER — POTASSIUM CHLORIDE CRYS ER 20 MEQ PO TBCR
40.0000 meq | EXTENDED_RELEASE_TABLET | Freq: Two times a day (BID) | ORAL | Status: DC
  Administered 2024-03-21: 40 meq via ORAL
  Filled 2024-03-21: qty 2

## 2024-03-21 MED ORDER — BUDESON-GLYCOPYRROL-FORMOTEROL 160-9-4.8 MCG/ACT IN AERO
2.0000 | INHALATION_SPRAY | Freq: Two times a day (BID) | RESPIRATORY_TRACT | Status: DC
  Filled 2024-03-21: qty 5.9

## 2024-03-21 MED ORDER — CLONIDINE HCL 0.1 MG PO TABS: 0.1000 mg | ORAL_TABLET | Freq: Every day | ORAL | Status: DC

## 2024-03-21 MED ORDER — POTASSIUM CHLORIDE ER 20 MEQ PO TBCR: 2.0000 | EXTENDED_RELEASE_TABLET | ORAL | 0 refills | Status: DC

## 2024-03-21 MED ORDER — CALCITRIOL 0.25 MCG PO CAPS
0.2500 ug | ORAL_CAPSULE | Freq: Every day | ORAL | Status: DC
  Administered 2024-03-21: 0.25 ug via ORAL
  Filled 2024-03-21: qty 1

## 2024-03-21 MED ORDER — ROPINIROLE HCL 0.25 MG PO TABS
0.2500 mg | ORAL_TABLET | Freq: Every day | ORAL | Status: DC
  Filled 2024-03-21: qty 1

## 2024-03-21 MED ORDER — ISOSORBIDE MONONITRATE ER 30 MG PO TB24
60.0000 mg | ORAL_TABLET | Freq: Every day | ORAL | Status: DC
Start: 2024-03-21 — End: 2024-03-21
  Administered 2024-03-21: 60 mg via ORAL
  Filled 2024-03-21: qty 2

## 2024-03-21 MED ORDER — BUSPIRONE HCL 10 MG PO TABS
5.0000 mg | ORAL_TABLET | Freq: Two times a day (BID) | ORAL | Status: DC
  Administered 2024-03-21: 5 mg via ORAL
  Filled 2024-03-21: qty 1
  Filled 2024-03-21 (×2): qty 0.5

## 2024-03-21 MED ORDER — HYDRALAZINE HCL 50 MG PO TABS
100.0000 mg | ORAL_TABLET | Freq: Three times a day (TID) | ORAL | Status: DC
  Administered 2024-03-21: 100 mg via ORAL
  Filled 2024-03-21: qty 2

## 2024-03-21 MED ORDER — INSULIN GLARGINE-YFGN 100 UNIT/ML ~~LOC~~ SOLN
40.0000 [IU] | Freq: Every day | SUBCUTANEOUS | Status: DC
  Administered 2024-03-21: 40 [IU] via SUBCUTANEOUS
  Filled 2024-03-21: qty 0.4

## 2024-03-21 MED ORDER — ASPIRIN 81 MG PO TBEC
81.0000 mg | DELAYED_RELEASE_TABLET | Freq: Every day | ORAL | Status: DC
  Administered 2024-03-21: 81 mg via ORAL
  Filled 2024-03-21: qty 1

## 2024-03-21 MED ORDER — POTASSIUM CHLORIDE CRYS ER 20 MEQ PO TBCR: 40.0000 meq | EXTENDED_RELEASE_TABLET | Freq: Once | ORAL | Status: DC

## 2024-03-21 MED ORDER — AMLODIPINE BESYLATE 10 MG PO TABS
10.0000 mg | ORAL_TABLET | Freq: Every day | ORAL | Status: DC
  Administered 2024-03-21: 10 mg via ORAL
  Filled 2024-03-21: qty 1

## 2024-03-21 MED ORDER — METOPROLOL TARTRATE 25 MG PO TABS
25.0000 mg | ORAL_TABLET | Freq: Two times a day (BID) | ORAL | Status: DC
  Administered 2024-03-21: 25 mg via ORAL
  Filled 2024-03-21: qty 1

## 2024-03-21 NOTE — Discharge Summary (Signed)
 Physician Discharge Summary  [name] FMW:[id number] DOB: 13-Jul-1946 DOA: 03/20/2024  PCP: Paseda, Folashade R, FNP  Admit date: 03/20/2024 Discharge date: 03/21/2024  Admitted From: Home Disposition: Home  Recommendations for Outpatient Follow-up:  Follow up with PCP in 1-2 weeks Please obtain renal function test/CBC in one week Nephrology to schedule follow-up  Home Health: N/A Equipment/Devices: N/A  Discharge Condition: Stable CODE STATUS: Full code Diet recommendation: Low-salt diet  Discharge summary: 78 y.o. female with past medical history of CKD stage V, HFpEF, coronary artery disease, CVA, carotid stenosis, history of brain aneurysm, history of CVA, diabetes insulin -dependent type II, HTN, PVD, and COPD who presented to the emergency department after she went to her cardiologist for progressive leg and buttocks pain. At the cardiologist office, her provider reviewed the blood work from her nephrologist where it showed an increase in serum creatinine from 4.79 on 5/16 to 8.21. In the emergency department, repeats laboratory work showed a serum creatinine of 8.71, eGFR 4, BUN 91.  Patient was hemodynamically stable.  She was admitted with nephrology consultation and possible need for dialysis.  ESRD: Recently doubled creatinine.  Followed by nephrology as outpatient very closely.  Right brachiocephalic AV fistula superficialization 5/14, fistula created 4/2.  Currently hemodynamically stable.  Seen by nephrology.  No evidence of fluid overload uremia or acidemia. As per nephrology recommendation, patient is going home with close outpatient follow-up with Dr. Dennise. Patient was recommended to continue all antihypertensives. Patient will continue Lasix  80 mg oral twice daily.  Also on metolazone.  Hypokalemia: Significant hypokalemia.  On Lasix  80 mg twice daily, inconsistent on potassium. Potassium is 2.5 today. Patient will receive 80 mEq of potassium today.  She will be  discharged on 40 mg and potassium every other day to continue.  Previously supposed to be on 20 mEq every other day.  Musculoskeletal pain: Likely multifactorial. Previously on rosuvastatin  40 mg daily.  Reviewed cardiology notes, recommended to take rosuvastatin  10 mg daily.  This will be prescribed.   Stable for discharge with close outpatient follow-up.  Discharge Diagnoses:  Principal Problem:   Acute on chronic renal failure (HCC) Active Problems:   CKD (chronic kidney disease) stage 5, GFR less than 15 ml/min (HCC)   Essential hypertension   Type 2 diabetes mellitus with hyperlipidemia (HCC)   Hyperlipidemia associated with type 2 diabetes mellitus (HCC)   Peripheral vascular disease due to secondary diabetes mellitus (HCC)   Esophageal reflux   COPD (chronic obstructive pulmonary disease) (HCC)   Depression, major, recurrent, in partial remission (HCC)   Hypokalemia    Discharge Instructions  Discharge Instructions     Diet - low sodium heart healthy   Complete by: As directed    Increase activity slowly   Complete by: As directed       Allergies as of 03/21/2024       Reactions   Ciprofloxacin  Swelling   Ozempic  (0.25 Or 0.5 Mg-dose) [semaglutide (0.25 Or 0.5mg -dos)] Nausea And Vomiting   Plavix [clopidogrel Bisulfate] Palpitations   Amoxicillin Itching, Swelling   FACE & EYES SWELL   Ace Inhibitors Cough   Sore throat   Atorvastatin     myalgias   Lyrica [pregabalin] Itching, Nausea Only    gain weight   Penicillins Other (See Comments)   Pt unsure if there is an allergic reaction         Medication List     STOP taking these medications    hydrOXYzine  25 MG tablet Commonly known as:  ATARAX        TAKE these medications    acetaminophen  500 MG tablet Commonly known as: TYLENOL  Take 1,000 mg by mouth 2 (two) times daily as needed for mild pain (pain score 1-3).   amLODipine  10 MG tablet Commonly known as: NORVASC  Take 1 tablet (10 mg total)  by mouth daily.   aspirin  EC 81 MG tablet Take 1 tablet (81 mg total) by mouth daily. Swallow whole.   busPIRone  5 MG tablet Commonly known as: BUSPAR  Take 1 tablet (5 mg total) by mouth 2 (two) times daily.   calcitRIOL  0.25 MCG capsule Commonly known as: ROCALTROL  Take 0.25 mcg by mouth in the morning.   cloNIDine  0.1 MG tablet Commonly known as: CATAPRES  Take 1 tablet (0.1 mg total) by mouth at bedtime.   Fish Oil 1000 MG Caps Take 1,000 mg by mouth in the morning and at bedtime.   furosemide  40 MG tablet Commonly known as: LASIX  Take 80 mg by mouth 2 (two) times daily.   hydrALAZINE  100 MG tablet Commonly known as: APRESOLINE  Take 1 tablet (100 mg total) by mouth every 8 (eight) hours.   Insulin  Degludec FlexTouch 200 UNIT/ML Sopn Inject 40 Units into the skin daily.   isosorbide  mononitrate 60 MG 24 hr tablet Commonly known as: IMDUR  Take 1 tablet (60 mg total) by mouth daily.   meclizine  25 MG tablet Commonly known as: ANTIVERT  1/2 tab up to 3 times daily for motion sickness/dizziness. What changed:  how much to take how to take this when to take this reasons to take this additional instructions   metolazone 5 MG tablet Commonly known as: ZAROXOLYN Take 5 mg by mouth every other day.   metoprolol  tartrate 25 MG tablet Commonly known as: LOPRESSOR  Take 1 tablet (25 mg total) by mouth 2 (two) times daily.   NovoLOG  FlexPen 100 UNIT/ML FlexPen Generic drug: insulin  aspart Inject 10-14 Units into the skin 3 (three) times daily before meals. What changed: additional instructions   Potassium Chloride  ER 20 MEQ Tbcr Take 2 tablets (40 mEq total) by mouth every other day. Start taking on: March 22, 2024 What changed:  how much to take when to take this   rOPINIRole  0.25 MG tablet Commonly known as: REQUIP  Take 1 tablet (0.25 mg total) by mouth at bedtime. Take 0.25 mg by mouth at bedtime.   rosuvastatin  10 MG tablet Commonly known as: CRESTOR  Take 1  tablet (10 mg total) by mouth daily. What changed: Another medication with the same name was removed. Continue taking this medication, and follow the directions you see here.   Trelegy Ellipta  200-62.5-25 MCG/ACT Aepb Generic drug: Fluticasone -Umeclidin-Vilant Inhale 1 puff into the lungs daily.        Allergies  Allergen Reactions   Ciprofloxacin  Swelling   Ozempic  (0.25 Or 0.5 Mg-Dose) [Semaglutide (0.25 Or 0.5mg -Dos)] Nausea And Vomiting   Plavix [Clopidogrel Bisulfate] Palpitations   Amoxicillin Itching and Swelling    FACE & EYES SWELL   Ace Inhibitors Cough    Sore throat   Atorvastatin      myalgias   Lyrica [Pregabalin] Itching and Nausea Only     gain weight   Penicillins Other (See Comments)    Pt unsure if there is an allergic reaction     Consultations: Nephrology   Procedures/Studies: No results found. (Echo, Carotid, EGD, Colonoscopy, ERCP)    Subjective: Patient seen and examined in the morning rounds.  She had just arrived from emergency room to the medical floor.  Patient denies any complaints.  She was not really interested to stay in the hospital and would rather go home.  Case discussed with nephrology.  Discharge planning made.   Discharge Exam: Vitals:   03/21/24 0901 03/21/24 0911  BP: (!) 168/47 (!) 168/47  Pulse: 72 72  Resp: 19   Temp: 98.8 F (37.1 C)   SpO2:     Vitals:   03/21/24 0615 03/21/24 0756 03/21/24 0901 03/21/24 0911  BP: (!) 151/48 (!) 168/47 (!) 168/47 (!) 168/47  Pulse: (!) 59 72 72 72  Resp: 18 19 19    Temp:  98.8 F (37.1 C) 98.8 F (37.1 C)   TempSrc:  Oral Oral   SpO2: 96%     Weight:   80.3 kg   Height:   5' 6 (1.676 m)     General: Pt is alert, awake, not in acute distress Cardiovascular: RRR, S1/S2 +, no rubs, no gallops Respiratory: CTA bilaterally, no wheezing, no rhonchi Abdominal: Soft, NT, ND, bowel sounds + Extremities: no edema, no cyanosis Patient has right brachiocephalic fistula with thrill  present.    The results of significant diagnostics from this hospitalization (including imaging, microbiology, ancillary and laboratory) are listed below for reference.     Microbiology: No results found for this or any previous visit (from the past 240 hours).   Labs: BNP (last 3 results) Recent Labs    03/31/23 1639 01/03/24 1300  BNP 26.7 518.1*   Basic Metabolic Panel: Recent Labs  Lab 03/20/24 1620 03/21/24 0044  NA 134* 135  K 3.0* 2.5*  CL 88* 88*  CO2 29 29  GLUCOSE 344* 301*  BUN 91* 90*  CREATININE 8.71* 8.48*  CALCIUM  8.5* 8.2*  PHOS  --  7.8*   Liver Function Tests: Recent Labs  Lab 03/20/24 1620 03/21/24 0044  AST 32  --   ALT 20  --   ALKPHOS 67  --   BILITOT 0.7  --   PROT 6.8  --   ALBUMIN  2.9* 2.5*   No results for input(s): LIPASE, AMYLASE in the last 168 hours. No results for input(s): AMMONIA in the last 168 hours. CBC: Recent Labs  Lab 03/20/24 1620 03/21/24 0044  WBC 11.3* 10.2  NEUTROABS 8.0*  --   HGB 12.0 10.7*  HCT 37.0 32.2*  MCV 99.5 97.9  PLT 273 237   Cardiac Enzymes: Recent Labs  Lab 03/20/24 1620  CKTOTAL 382*   BNP: Invalid input(s): POCBNP CBG: Recent Labs  Lab 03/20/24 2217 03/21/24 0809 03/21/24 1138  GLUCAP 149* 182* 292*   D-Dimer No results for input(s): DDIMER in the last 72 hours. Hgb A1c No results for input(s): HGBA1C in the last 72 hours. Lipid Profile No results for input(s): CHOL, HDL, LDLCALC, TRIG, CHOLHDL, LDLDIRECT in the last 72 hours. Thyroid  function studies No results for input(s): TSH, T4TOTAL, T3FREE, THYROIDAB in the last 72 hours.  Invalid input(s): FREET3 Anemia work up No results for input(s): VITAMINB12, FOLATE, FERRITIN, TIBC, IRON, RETICCTPCT in the last 72 hours. Urinalysis    Component Value Date/Time   COLORURINE YELLOW 01/06/2024 1002   APPEARANCEUR HAZY (A) 01/06/2024 1002   LABSPEC 1.022 01/06/2024 1002    PHURINE 5.0 01/06/2024 1002   GLUCOSEU >=500 (A) 01/06/2024 1002   GLUCOSEU 500 (?) 01/30/2010 1056   HGBUR NEGATIVE 01/06/2024 1002   BILIRUBINUR NEGATIVE 01/06/2024 1002   KETONESUR NEGATIVE 01/06/2024 1002   PROTEINUR >=300 (A) 01/06/2024 1002   UROBILINOGEN 0.2 10/04/2014 1807  NITRITE NEGATIVE 01/06/2024 1002   LEUKOCYTESUR NEGATIVE 01/06/2024 1002   Sepsis Labs Recent Labs  Lab 03/20/24 1620 03/21/24 0044  WBC 11.3* 10.2   Microbiology No results found for this or any previous visit (from the past 240 hours).   Time coordinating discharge:  40 minutes  SIGNED:   Renato Applebaum, MD  [facility] 03/21/2024, 11:44 AM

## 2024-03-21 NOTE — ED Notes (Signed)
 Pt K+ 2.5. On-call MD paged by Diplomatic Services operational officer. Primary RN notified of the critical value and that MD was paged to the assignment phone.

## 2024-03-21 NOTE — Progress Notes (Signed)
 DISCHARGE NOTE HOME [facility]HLQUIST to be discharged Home per MD order. Discussed prescriptions and follow up appointments with the patient. Prescriptions given to patient; medication list explained in detail. Patient verbalized understanding.  Skin clean, dry and intact without evidence of skin break down, no evidence of skin tears noted. IV catheter discontinued intact. Site without signs and symptoms of complications. Dressing and pressure applied. Pt denies pain at the site currently. No complaints noted.  Patient free of lines, drains, and wounds.   An After Visit Summary (AVS) was printed and given to the patient. Patient escorted via wheelchair, and discharged home via private auto.  Doyal Sias, RN

## 2024-03-21 NOTE — ED Notes (Signed)
 MD notified of pts critical potassium level with no updated orders to address the lab.

## 2024-03-21 NOTE — Consult Note (Signed)
 Reason for Consult:AKI superimposed on CKD stage V Referring Physician: Sim Emery CROME, MD   Chief Complaint: AKI superimposed on CKD stage V  Assessment/Plan: ESRD Patient's creatinine doubled from 4.79 to 8.21 in one month with an eGFR of 4 consistent with ESRD. Patient underwent a right brachiocephalic AV fistula superficialization by Dr. Serene on 02/01/2024, the fistula was created on 12/21/2023. She is on a home regimen of 80 mg lasix  BID.   Patient does not have indications for HD at this time as she is not volume overloaded, uremic, acidemic, or hyperkalemic. Will not start HD while inpatient.  Plan: -Continue home lasix  80 mg BID with potassium supplementation  -Will monitor UOP with strict ins and outs  -Continue outpatient nephrology follow up   Per primary: HFpEF Hypokalemia  HTN PVD COPD   HPI: [name] is an 78 y.o. female is a past medical history of CKD stage V, HFpEF, coronary artery disease, CVA, carotid stenosis, history of brain aneurysm, history of CVA, diabetes non-insulin -dependent type II, HTN, PVD, and COPD who presented to the emergency department after she went to her cardiologist for progressive leg and buttocks pain. At the cardiologist office, her provider reviewed the blood work from her nephrologist where it showed an increase in serum creatinine from 4.79 on 5/16 to 8.21. In the emergency department, repeats laboratory work showed a serum creatinine of 8.71, eGFR 4, BUN 91.   Today, she denies shortness of breath or new lower extremity edema. She feels well overall and denies nausea, vomiting, or lack of appetite. She continues to produce urine.    ROS Pertinent items are noted in HPI.  Chemistry and CBC: Creat  Date/Time Value Ref Range Status  08/08/2023 03:06 PM 2.81 (H) 0.60 - 1.00 mg/dL Final  87/81/7976 96:53 PM 2.45 (H) 0.60 - 1.00 mg/dL Final  88/84/7976 96:90 PM 2.52 (H) 0.60 - 1.00 mg/dL Final  97/78/7976 96:60 PM 1.79 (H) 0.60 -  1.00 mg/dL Final  88/84/7977 96:83 PM 1.93 (H) 0.60 - 1.00 mg/dL Final  96/71/7977 88:62 AM 1.78 (H) 0.60 - 0.93 mg/dL Final    Comment:    For patients >49 years of age, the reference limit for Creatinine is approximately 13% higher for people identified as African-American. .   08/04/2020 03:31 PM 2.20 (H) 0.60 - 0.93 mg/dL Final    Comment:    For patients >90 years of age, the reference limit for Creatinine is approximately 13% higher for people identified as African-American. .   07/31/2020 10:54 AM 2.38 (H) 0.60 - 0.93 mg/dL Final    Comment:    For patients >6 years of age, the reference limit for Creatinine is approximately 13% higher for people identified as African-American. .   07/30/2020 04:42 PM 2.42 (H) 0.60 - 0.93 mg/dL Final    Comment:    For patients >95 years of age, the reference limit for Creatinine is approximately 13% higher for people identified as African-American. SABRA   10/18/2019 02:27 PM 1.87 (H) 0.60 - 0.93 mg/dL Final    Comment:    For patients >72 years of age, the reference limit for Creatinine is approximately 13% higher for people identified as African-American. SABRA   10/08/2019 03:54 PM 1.85 (H) 0.60 - 0.93 mg/dL Final    Comment:    For patients >67 years of age, the reference limit for Creatinine is approximately 13% higher for people identified as African-American. SABRA   11/13/2018 11:29 AM 1.59 (H) 0.60 - 0.93 mg/dL  Final    Comment:    For patients >59 years of age, the reference limit for Creatinine is approximately 13% higher for people identified as African-American. .   08/28/2018 11:46 AM 1.44 (H) 0.60 - 0.93 mg/dL Final    Comment:    For patients >65 years of age, the reference limit for Creatinine is approximately 13% higher for people identified as African-American. .   07/19/2018 10:26 AM 1.33 (H) 0.60 - 0.93 mg/dL Final    Comment:    For patients >26 years of age, the reference limit for Creatinine is  approximately 13% higher for people identified as African-American. SABRA   04/18/2018 11:49 AM 1.29 (H) 0.60 - 0.93 mg/dL Final    Comment:    For patients >73 years of age, the reference limit for Creatinine is approximately 13% higher for people identified as African-American. .   01/03/2018 09:10 AM 1.27 (H) 0.60 - 0.93 mg/dL Final    Comment:    For patients >76 years of age, the reference limit for Creatinine is approximately 13% higher for people identified as African-American. SABRA   09/27/2017 10:12 AM 1.41 (H) 0.60 - 0.93 mg/dL Final    Comment:    For patients >8 years of age, the reference limit for Creatinine is approximately 13% higher for people identified as African-American. SABRA   05/04/2017 04:55 PM 1.30 (H) 0.60 - 0.93 mg/dL Final    Comment:    For patients >85 years of age, the reference limit for Creatinine is approximately 13% higher for people identified as African-American. SABRA   01/31/2017 11:19 AM 1.15 (H) 0.60 - 0.93 mg/dL Final    Comment:      For patients > or = 77 years of age: The upper reference limit for Creatinine is approximately 13% higher for people identified as African-American.     11/10/2016 04:59 PM 1.17 (H) 0.60 - 0.93 mg/dL Final    Comment:      For patients > or = 77 years of age: The upper reference limit for Creatinine is approximately 13% higher for people identified as African-American.     07/22/2016 04:29 PM 1.24 (H) 0.50 - 0.99 mg/dL Final    Comment:      For patients > or = 77 years of age: The upper reference limit for Creatinine is approximately 13% higher for people identified as African-American.     02/02/2016 09:30 AM 1.03 (H) 0.50 - 0.99 mg/dL Final  97/82/7982 89:48 AM 0.96 0.50 - 0.99 mg/dL Final  88/84/7983 87:75 PM 0.80 0.50 - 0.99 mg/dL Final  92/93/7983 97:57 PM 0.84 0.50 - 1.10 mg/dL Final  97/98/7983 90:63 AM 1.05 0.50 - 1.10 mg/dL Final  87/85/7984 90:78 AM 1.12 (H) 0.50 - 1.10 mg/dL Final   91/75/7984 89:87 AM 1.28 (H) 0.50 - 1.10 mg/dL Final  94/77/7984 90:84 AM 1.19 (H) 0.50 - 1.10 mg/dL Final  97/86/7984 98:63 PM 1.21 (H) 0.50 - 1.10 mg/dL Final   Creatinine, Ser  Date/Time Value Ref Range Status  03/21/2024 12:44 AM 8.48 (H) 0.44 - 1.00 mg/dL Final  92/98/7974 95:79 PM 8.71 (H) 0.44 - 1.00 mg/dL Final  94/85/7974 90:72 AM 4.60 (H) 0.44 - 1.00 mg/dL Final  95/77/7974 93:41 AM 3.46 (H) 0.44 - 1.00 mg/dL Final  95/78/7974 92:87 AM 3.39 (H) 0.44 - 1.00 mg/dL Final  95/79/7974 94:77 AM 3.42 (H) 0.44 - 1.00 mg/dL Final  95/80/7974 94:95 AM 3.19 (H) 0.44 - 1.00 mg/dL Final  95/81/7974 95:87 AM 3.40 (  H) 0.44 - 1.00 mg/dL Final  95/82/7974 95:82 AM 2.98 (H) 0.44 - 1.00 mg/dL Final  95/83/7974 96:91 AM 2.93 (H) 0.44 - 1.00 mg/dL Final  95/84/7974 98:99 PM 2.96 (H) 0.44 - 1.00 mg/dL Final  95/97/7974 92:92 AM 3.70 (H) 0.44 - 1.00 mg/dL Final  87/69/7975 97:44 PM 3.04 (H) 0.57 - 1.00 mg/dL Final  92/88/7975 95:60 PM 2.15 (H) 0.57 - 1.00 mg/dL Final  93/74/7975 92:70 AM 2.20 (H) 0.44 - 1.00 mg/dL Final  87/88/7976 90:48 AM 2.34 (H) 0.44 - 1.00 mg/dL Final  87/89/7976 94:83 AM 1.94 (H) 0.44 - 1.00 mg/dL Final  87/90/7976 90:73 AM 2.06 (H) 0.44 - 1.00 mg/dL Final  87/91/7976 92:73 PM 2.20 (H) 0.44 - 1.00 mg/dL Final  93/79/7976 89:55 AM 2.20 (H) 0.44 - 1.00 mg/dL Final  96/96/7976 98:50 AM 1.98 (H) 0.44 - 1.00 mg/dL Final  96/97/7976 98:45 AM 1.80 (H) 0.44 - 1.00 mg/dL Final  96/98/7976 88:94 AM 1.85 (H) 0.44 - 1.00 mg/dL Final  97/91/7977 98:53 PM 1.86 (H) 0.44 - 1.00 mg/dL Final  95/73/7978 96:65 PM 1.81 (H) 0.40 - 1.20 mg/dL Final  90/82/7980 98:57 PM 1.41 (H) 0.44 - 1.00 mg/dL Final  88/96/7981 95:63 PM 1.30 (H) 0.44 - 1.00 mg/dL Final  94/89/7981 88:93 AM 1.08 (H) 0.44 - 1.00 mg/dL Final  94/90/7981 95:59 AM 1.14 (H) 0.44 - 1.00 mg/dL Final  94/91/7981 91:63 AM 1.00 0.44 - 1.00 mg/dL Final  94/91/7981 91:83 AM 1.08 (H) 0.44 - 1.00 mg/dL Final  89/69/7982 95:42 PM 1.10  (H) 0.44 - 1.00 mg/dL Final  89/69/7982 95:54 PM 1.00 0.44 - 1.00 mg/dL Final  98/84/7983 96:43 PM 1.20 (H) 0.50 - 1.10 mg/dL Final  98/84/7983 96:54 PM 1.17 (H) 0.50 - 1.10 mg/dL Final  88/73/7986 92:80 AM 1.20 (H) 0.50 - 1.10 mg/dL Final  88/87/7986 93:94 PM 0.93 0.50 - 1.10 mg/dL Final  95/83/7986 93:86 AM 1.00 0.50 - 1.10 mg/dL Final  91/73/7987 97:99 AM 1.50 (H) 0.50 - 1.10 mg/dL Final  91/78/7987 91:62 AM 1.30 (H) 0.50 - 1.10 mg/dL Final  94/86/7988 89:43 AM 1.0 0.4 - 1.2 mg/dL Final  95/90/7989 90:67 AM 1.0 0.4 - 1.2 mg/dL Final  97/80/7989 96:93 PM 1.04 0.4 - 1.2 mg/dL Final  97/81/7989 91:64 AM 1.23 (H) 0.4 - 1.2 mg/dL Final  89/85/7990 91:87 AM 1.1 0.4 - 1.2 mg/dL Final  95/83/7990 91:58 AM 1.1 0.4 - 1.2 mg/dL Final   Recent Labs  Lab 03/20/24 1620 03/21/24 0044  NA 134* 135  K 3.0* 2.5*  CL 88* 88*  CO2 29 29  GLUCOSE 344* 301*  BUN 91* 90*  CREATININE 8.71* 8.48*  CALCIUM  8.5* 8.2*  PHOS  --  7.8*   Recent Labs  Lab 03/20/24 1620 03/21/24 0044  WBC 11.3* 10.2  NEUTROABS 8.0*  --   HGB 12.0 10.7*  HCT 37.0 32.2*  MCV 99.5 97.9  PLT 273 237   Liver Function Tests: Recent Labs  Lab 03/20/24 1620 03/21/24 0044  AST 32  --   ALT 20  --   ALKPHOS 67  --   BILITOT 0.7  --   PROT 6.8  --   ALBUMIN  2.9* 2.5*   No results for input(s): LIPASE, AMYLASE in the last 168 hours. No results for input(s): AMMONIA in the last 168 hours. Cardiac Enzymes: Recent Labs  Lab 03/20/24 1620  CKTOTAL 382*   Iron Studies: No results for input(s): IRON, TIBC, TRANSFERRIN, FERRITIN in the last 72 hours. PT/INR: @LABRCNTIP (inr:5)  Xrays/Other Studies: ) Results for orders placed or performed during the hospital encounter of 03/20/24 (from the past 48 hours)  CBC with Differential     Status: Abnormal   Collection Time: 03/20/24  4:20 PM  Result Value Ref Range   WBC 11.3 (H) 4.0 - 10.5 K/uL   RBC 3.72 (L) 3.87 - 5.11 MIL/uL   Hemoglobin 12.0 12.0 -  15.0 g/dL   HCT 62.9 63.9 - 53.9 %   MCV 99.5 80.0 - 100.0 fL   MCH 32.3 26.0 - 34.0 pg   MCHC 32.4 30.0 - 36.0 g/dL   RDW 88.0 88.4 - 84.4 %   Platelets 273 150 - 400 K/uL   nRBC 0.0 0.0 - 0.2 %   Neutrophils Relative % 70 %   Neutro Abs 8.0 (H) 1.7 - 7.7 K/uL   Lymphocytes Relative 19 %   Lymphs Abs 2.2 0.7 - 4.0 K/uL   Monocytes Relative 9 %   Monocytes Absolute 1.0 0.1 - 1.0 K/uL   Eosinophils Relative 1 %   Eosinophils Absolute 0.1 0.0 - 0.5 K/uL   Basophils Relative 1 %   Basophils Absolute 0.1 0.0 - 0.1 K/uL   Immature Granulocytes 0 %   Abs Immature Granulocytes 0.04 0.00 - 0.07 K/uL    Comment: Performed at [facility] Lab, [address] [place], [place] [address]  Comprehensive metabolic panel     Status: Abnormal   Collection Time: 03/20/24  4:20 PM  Result Value Ref Range   Sodium 134 (L) 135 - 145 mmol/L   Potassium 3.0 (L) 3.5 - 5.1 mmol/L   Chloride 88 (L) 98 - 111 mmol/L   CO2 29 22 - 32 mmol/L   Glucose, Bld 344 (H) 70 - 99 mg/dL    Comment: Glucose reference range applies only to samples taken after fasting for at least 8 hours.   BUN 91 (H) 8 - 23 mg/dL   Creatinine, Ser 1.28 (H) 0.44 - 1.00 mg/dL   Calcium  8.5 (L) 8.9 - 10.3 mg/dL   Total Protein 6.8 6.5 - 8.1 g/dL   Albumin  2.9 (L) 3.5 - 5.0 g/dL   AST 32 15 - 41 U/L   ALT 20 0 - 44 U/L   Alkaline Phosphatase 67 38 - 126 U/L   Total Bilirubin 0.7 0.0 - 1.2 mg/dL   GFR, Estimated 4 (L) >60 mL/min    Comment: (NOTE) Calculated using the CKD-EPI Creatinine Equation (2021)    Anion gap 17 (H) 5 - 15    Comment: Performed at [facility], [address], [place], [place] [address]  CK     Status: Abnormal   Collection Time: 03/20/24  4:20 PM  Result Value Ref Range   Total CK 382 (H) 38 - 234 U/L    Comment: Performed at [facility], [address], [place], [place] [address]  Troponin I (High Sensitivity)     Status: Abnormal   Collection Time: 03/20/24  4:20 PM  Result Value Ref  Range   Troponin I (High Sensitivity) 63 (H) <18 ng/L    Comment: (NOTE) Elevated high sensitivity troponin I (hsTnI) values and significant  changes across serial measurements may suggest ACS but many other  chronic and acute conditions are known to elevate hsTnI results.  Refer to the Links section for chest pain algorithms and additional  guidance. Performed at [facility], [address] [place], [place] [address]   Troponin I (  High Sensitivity)     Status: Abnormal   Collection Time: 03/20/24  6:38 PM  Result Value Ref Range   Troponin I (High Sensitivity) 66 (H) <18 ng/L    Comment: (NOTE) Elevated high sensitivity troponin I (hsTnI) values and significant  changes across serial measurements may suggest ACS but many other  chronic and acute conditions are known to elevate hsTnI results.  Refer to the Links section for chest pain algorithms and additional  guidance. Performed at [facility] Lab, [address] [place], [place] [address]   CBG monitoring, ED     Status: Abnormal   Collection Time: 03/20/24 10:17 PM  Result Value Ref Range   Glucose-Capillary 149 (H) 70 - 99 mg/dL    Comment: Glucose reference range applies only to samples taken after fasting for at least 8 hours.  Renal function panel     Status: Abnormal   Collection Time: 03/21/24 12:44 AM  Result Value Ref Range   Sodium 135 135 - 145 mmol/L   Potassium 2.5 (LL) 3.5 - 5.1 mmol/L    Comment: CRITICAL RESULT CALLED TO, READ BACK BY AND VERIFIED WITH SIMS S, RN 0113 03/21/2024 SANDOVAL K   Chloride 88 (L) 98 - 111 mmol/L   CO2 29 22 - 32 mmol/L   Glucose, Bld 301 (H) 70 - 99 mg/dL    Comment: Glucose reference range applies only to samples taken after fasting for at least 8 hours.   BUN 90 (H) 8 - 23 mg/dL   Creatinine, Ser 1.51 (H) 0.44 - 1.00 mg/dL   Calcium  8.2 (L) 8.9 - 10.3 mg/dL   Phosphorus 7.8 (H) 2.5 - 4.6 mg/dL   Albumin  2.5 (L) 3.5 - 5.0 g/dL   GFR, Estimated 4 (L) >60 mL/min     Comment: (NOTE) Calculated using the CKD-EPI Creatinine Equation (2021)    Anion gap 18 (H) 5 - 15    Comment: Performed at [facility] Lab, [address] [place], [place] [address]  CBC     Status: Abnormal   Collection Time: 03/21/24 12:44 AM  Result Value Ref Range   WBC 10.2 4.0 - 10.5 K/uL   RBC 3.29 (L) 3.87 - 5.11 MIL/uL   Hemoglobin 10.7 (L) 12.0 - 15.0 g/dL   HCT 67.7 (L) 63.9 - 53.9 %   MCV 97.9 80.0 - 100.0 fL   MCH 32.5 26.0 - 34.0 pg   MCHC 33.2 30.0 - 36.0 g/dL   RDW 87.8 88.4 - 84.4 %   Platelets 237 150 - 400 K/uL   nRBC 0.2 0.0 - 0.2 %    Comment: Performed at [facility] Lab, [address] [place], [place] [address]  Glucose, capillary     Status: Abnormal   Collection Time: 03/21/24  8:09 AM  Result Value Ref Range   Glucose-Capillary 182 (H) 70 - 99 mg/dL    Comment: Glucose reference range applies only to samples taken after fasting for at least 8 hours.   *Note: Due to a large number of results and/or encounters for the requested time period, some results have not been displayed. A complete set of results can be found in Results Review.   No results found.  PMH:   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,  small   Anxiety    Arthritis    Cataract  bilateral - surgery   CKD (chronic kidney disease), stage IV (HCC)    followed by [facility]   Colon polyps    Depression    Diabetes mellitus 1998   type 2   Diverticulosis    Emphysema of lung (HCC)    no one has evry told me that   ESOPHAGEAL STRICTURE 08/27/2008   Family history of adverse reaction to anesthesia    daughter n/v   GERD (gastroesophageal reflux disease)    Glaucoma    bilateral   Heart failure (HCC) 01/04/2024   Hemorrhoids    hx   Hiatal hernia    Hyperlipidemia    Hypertension 2000   Migraines    Neuropathy    fingers and toes    Peripheral vascular disease (HCC)    Phlebitis    30 years ago  left leg   Stroke [facility])    multiple mini strokes ( brain aneurysm ).  Right side weaker. 2 strokes    PSH:   Past Surgical History:  Procedure Laterality Date   ABDOMINAL AORTAGRAM N/A 01/04/2012   Procedure: ABDOMINAL AORTAGRAM;  Surgeon: Gaile LELON New, MD;  Location: [facility] CATH LAB;  Service: Cardiovascular;  Laterality: N/A;   ABDOMINAL AORTAGRAM N/A 08/15/2012   Procedure: ABDOMINAL EZELLA;  Surgeon: Gaile LELON New, MD;  Location: [facility] CATH LAB;  Service: Cardiovascular;  Laterality: N/A;   ABDOMINAL AORTOGRAM W/LOWER EXTREMITY Left 11/17/2021   Procedure: ABDOMINAL AORTOGRAM W/LOWER EXTREMITY;  Surgeon: New Gaile LELON, MD;  Location: [facility] INVASIVE CV LAB;  Service: Cardiovascular;  Laterality: Left;   ABDOMINAL AORTOGRAM W/LOWER EXTREMITY N/A 03/09/2022   Procedure: ABDOMINAL AORTOGRAM W/LOWER EXTREMITY;  Surgeon: New Gaile LELON, MD;  Location: [facility] INVASIVE CV LAB;  Service: Cardiovascular;  Laterality: N/A;   ABDOMINAL AORTOGRAM W/LOWER EXTREMITY N/A 03/15/2023   Procedure: ABDOMINAL AORTOGRAM W/LOWER EXTREMITY;  Surgeon: New Gaile LELON, MD;  Location: [facility] INVASIVE CV LAB;  Service: Cardiovascular;  Laterality: N/A;   ABDOMINAL HYSTERECTOMY  1984   AV FISTULA PLACEMENT Right 12/21/2023   Procedure: RIGHT ARM BRACHIOCEPHALIC ARTERIOVENOUS (AV) FISTULA CREATION;  Surgeon: New Gaile LELON, MD;  Location: [facility] OR;  Service: Vascular;  Laterality: Right;   BREAST SURGERY     cataract surgery Right 10/22/2015   cataract surgery Left 11/05/2015   CESAREAN SECTION     CHOLECYSTECTOMY     COLONOSCOPY  07/2010   DENTAL SURGERY  05/05/2012   left lower    ESOPHAGOGASTRODUODENOSCOPY  07/2010   EYE SURGERY  07/2013   Laser-Glaucoma   FEMORAL ARTERY STENT  05/11/2011   Left superficial femoral and popliteal artery   FEMORAL-POPLITEAL BYPASS GRAFT Left 11/18/2021   Procedure: LEFT FEMORAL-POPLITEAL ARTERY BYPASS GRAFT;   Surgeon: New Gaile LELON, MD;  Location: [facility] OR;  Service: Vascular;  Laterality: Left;   FISTULA SUPERFICIALIZATION Right 02/01/2024   Procedure: RIGHT AV FISTULA SUPERFICIALIZATION;  Surgeon: New Gaile LELON, MD;  Location: [facility] OR;  Service: Vascular;  Laterality: Right;   HERNIA REPAIR     times two   KNEE SURGERY     LOWER EXTREMITY ANGIOGRAM Left 08/15/2012   Procedure: LOWER EXTREMITY ANGIOGRAM;  Surgeon: Gaile LELON New, MD;  Location: [facility] CATH LAB;  Service: Cardiovascular;  Laterality: Left;  lt leg angio   PERIPHERAL VASCULAR BALLOON ANGIOPLASTY Left 03/09/2022   Procedure: PERIPHERAL VASCULAR BALLOON ANGIOPLASTY;  Surgeon: New Gaile LELON, MD;  Location: [facility] INVASIVE CV LAB;  Service: Cardiovascular;  Laterality: Left;   PERIPHERAL VASCULAR BALLOON ANGIOPLASTY  Left 03/15/2023   Procedure: PERIPHERAL VASCULAR BALLOON ANGIOPLASTY;  Surgeon: Serene Gaile ORN, MD;  Location: [facility] INVASIVE CV LAB;  Service: Cardiovascular;  Laterality: Left;   rotator cuff surgery     THYROID  SURGERY      Allergies:  Allergies  Allergen Reactions   Ciprofloxacin  Swelling   Ozempic  (0.25 Or 0.5 Mg-Dose) [Semaglutide (0.25 Or 0.5mg -Dos)] Nausea And Vomiting   Plavix [Clopidogrel Bisulfate] Palpitations   Amoxicillin Itching and Swelling    FACE & EYES SWELL   Ace Inhibitors Cough    Sore throat   Atorvastatin      myalgias   Lyrica [Pregabalin] Itching and Nausea Only     gain weight   Penicillins Other (See Comments)    Pt unsure if there is an allergic reaction     Medications:   Prior to Admission medications   Medication Sig Start Date End Date Taking? Authorizing Provider  acetaminophen  (TYLENOL ) 500 MG tablet Take 1,000 mg by mouth 2 (two) times daily as needed for mild pain (pain score 1-3).   Yes [provider]  amLODipine  (NORVASC ) 10 MG tablet Take 1 tablet (10 mg total) by mouth daily. 01/10/24  Yes Arlon Carliss ORN, DO  aspirin  EC 81 MG tablet Take 1 tablet (81 mg total) by  mouth daily. Swallow whole. 01/11/24  Yes Arlon Carliss ORN, DO  busPIRone  (BUSPAR ) 5 MG tablet Take 1 tablet (5 mg total) by mouth 2 (two) times daily. 02/01/24  Yes Rhyne, [name] J, PA-C  calcitRIOL  (ROCALTROL ) 0.25 MCG capsule Take 0.25 mcg by mouth in the morning. 11/30/23  Yes [provider]  cloNIDine  (CATAPRES ) 0.1 MG tablet Take 1 tablet (0.1 mg total) by mouth at bedtime. 01/10/24  Yes Arlon Carliss ORN, DO  Fluticasone -Umeclidin-Vilant (TRELEGY ELLIPTA ) 200-62.5-25 MCG/ACT AEPB Inhale 1 puff into the lungs daily. 07/20/23  Yes Mannam, Praveen, MD  furosemide  (LASIX ) 40 MG tablet Take 80 mg by mouth 2 (two) times daily.   Yes [provider]  hydrALAZINE  (APRESOLINE ) 100 MG tablet Take 1 tablet (100 mg total) by mouth every 8 (eight) hours. 02/16/24  Yes Monge, Damien BROCKS, NP  insulin  aspart (NOVOLOG  FLEXPEN) 100 UNIT/ML FlexPen Inject 10-14 Units into the skin 3 (three) times daily before meals. Patient taking differently: Inject 10-14 Units into the skin 3 (three) times daily before meals. Per sliding scale 03/05/24  Yes Trixie File, MD  insulin  degludec (TRESIBA  FLEXTOUCH) 200 UNIT/ML FlexTouch Pen Inject 40 Units into the skin daily. 01/10/24  Yes Arlon Carliss ORN, DO  isosorbide  mononitrate (IMDUR ) 60 MG 24 hr tablet Take 1 tablet (60 mg total) by mouth daily. 01/11/24  Yes Arlon Carliss ORN, DO  meclizine  (ANTIVERT ) 25 MG tablet 1/2 tab up to 3 times daily for motion sickness/dizziness. Patient taking differently: Take 25 mg by mouth daily as needed for dizziness or nausea. 11/10/21  Yes Jeanine Knee, NP  metolazone (ZAROXOLYN) 5 MG tablet Take 5 mg by mouth every other day.   Yes [provider]  metoprolol  tartrate (LOPRESSOR ) 25 MG tablet Take 1 tablet (25 mg total) by mouth 2 (two) times daily. 02/07/24 05/07/24 Yes Monge, Damien BROCKS, NP  Omega-3 Fatty Acids (FISH OIL) 1000 MG CAPS Take 1,000 mg by mouth in the morning and at bedtime. 11/21/21  Yes Bethanie Cough,  PA-C  Potassium Chloride  ER 20 MEQ TBCR Take 1 tablet by mouth 3 (three) times a week. 03/05/24  Yes [provider]  rOPINIRole  (REQUIP ) 0.25 MG tablet Take 1 tablet (  0.25 mg total) by mouth at bedtime. Take 0.25 mg by mouth at bedtime. 02/27/24  Yes Paseda, Folashade R, FNP  rosuvastatin  (CRESTOR ) 40 MG tablet Take 40 mg by mouth daily.   Yes [provider]  hydrOXYzine  (ATARAX ) 25 MG tablet Take 1 tablet (25 mg total) by mouth every 8 (eight) hours as needed for anxiety. Patient not taking: Reported on 03/20/2024 02/27/24   Paseda, Folashade R, FNP  rosuvastatin  (CRESTOR ) 10 MG tablet Take 1 tablet (10 mg total) by mouth daily. Patient not taking: Reported on 03/20/2024 03/20/24 06/18/24  West, Katlyn D, NP    Discontinued Meds:  There are no discontinued medications.  Social History:  reports that she has been smoking cigarettes. She started smoking about 3 months ago. She has a 20.1 pack-year smoking history. She has never been exposed to tobacco smoke. She has never used smokeless tobacco. She reports that she does not drink alcohol  and does not use drugs.  Family History:   Family History  Problem Relation Age of Onset   CAD Mother 61       Died of MI   Hypertension Mother    Heart attack Mother    Heart disease Mother    CAD Father 54       Died of MI   Heart disease Father    Heart attack Father    CAD Brother 79       Two brothers died of MI   Heart attack Brother    Heart disease Brother        Amputation   Diabetes Sister    Hypertension Sister    Heart attack Brother    Heart disease Brother    Stroke Sister    Colon cancer Neg Hx    Stomach cancer Neg Hx    Esophageal cancer Neg Hx     Blood pressure (!) 168/47, pulse 72, temperature 98.8 F (37.1 C), temperature source Oral, resp. rate 19, height 5' 6 (1.676 m), weight 80.3 kg, SpO2 96%. Physical Exam: General:NAD, resting in bed  Cardiac:RRR, RUE AVF has a palpable thrill and auscultated bruit   Pulmonary:Bibasilar crackles present on exam, normal effort on room air, no use of accessory muscles  Neuro:awake, alert, no tremors present  MSK:No pitting edema present on the bilateral lower extremities  Skin:warm and dry Psych:  tearful throughout conversation   Damien Lease, DO IM PGY-2 03/21/2024, 11:17 AM

## 2024-03-21 NOTE — Care Management CC44 (Signed)
 Condition Code 44 Documentation Completed  Patient Details  Name: [name] MRN: [id number] Date of Birth: 1945/10/20   Condition Code 44 given:  Yes Patient signature on Condition Code 44 notice:  Yes Documentation of 2 MD's agreement:  Yes Code 44 added to claim:  Yes    Tom-Johnson, Harvest Muskrat, RN 03/21/2024, 12:31 PM

## 2024-03-21 NOTE — TOC Transition Note (Addendum)
 Transition of Care [facility]) - Discharge Note   Patient Details  Name: [name] MRN: [id number] Date of Birth: Nov 22, 1945  Transition of Care [facility]) CM/SW Contact:  Tom-Johnson, Harvest Muskrat, RN Phone Number: 03/21/2024, 11:53 AM   Clinical Narrative:     Patient is scheduled for discharge today.  Readmission Risk Assessment done. Outpatient f/u, hospital f/u and discharge instructions on AVS. No TOC needs or recommendations noted. Husband Tanda at bedside and will transport at discharge.  No further TOC needs noted.       Final next level of care: Home/Self Care Barriers to Discharge: Barriers Resolved   Patient Goals and CMS Choice Patient states their goals for this hospitalization and ongoing recovery are:: To return home CMS Medicare.gov Compare Post Acute Care list provided to:: Patient Choice offered to / list presented to : NA      Discharge Placement                Patient to be transferred to facility by: Husband Name of family member notified: Tanda    Discharge Plan and Services Additional resources added to the After Visit Summary for                  DME Arranged: N/A DME Agency: NA       HH Arranged: NA HH Agency: NA        Social Drivers of Health (SDOH) Interventions SDOH Screenings   Food Insecurity: No Food Insecurity (03/21/2024)  Housing: Low Risk  (03/21/2024)  Transportation Needs: No Transportation Needs (03/21/2024)  Utilities: Not At Risk (03/21/2024)  Depression (PHQ2-9): Low Risk  (02/10/2024)  Financial Resource Strain: Low Risk  (09/19/2023)  Physical Activity: Unknown (09/19/2023)  Social Connections: Socially Integrated (03/21/2024)  Stress: Stress Concern Present (09/19/2023)  Tobacco Use: High Risk (03/20/2024)     Readmission Risk Interventions    03/21/2024   11:48 AM 11/20/2021    2:37 PM  Readmission Risk Prevention Plan  Transportation Screening Complete Complete  PCP or Specialist Appt within 5-7 Days   Complete  PCP or Specialist Appt within 3-5 Days Complete   Home Care Screening  Complete  Medication Review (RN CM)  Complete  HRI or Home Care Consult Complete   Social Work Consult for Recovery Care Planning/Counseling Complete   Palliative Care Screening Not Applicable   Medication Review Oceanographer) Referral to Pharmacy

## 2024-03-21 NOTE — Care Management Obs Status (Signed)
 MEDICARE OBSERVATION STATUS NOTIFICATION   Patient Details  Name: [name] MRN: [id number] Date of Birth: 20-Mar-1946   Medicare Observation Status Notification Given:  Yes    Tom-Johnson, Harvest Muskrat, RN 03/21/2024, 12:30 PM

## 2024-03-26 ENCOUNTER — Telehealth: Payer: Self-pay

## 2024-03-26 NOTE — Telephone Encounter (Signed)
 Copied from CRM [id number]. Topic: Clinical - Home Health Verbal Orders >> Mar 22, 2024  5:31 PM DeAngela L wrote: Caller/Agency: Jorene Brooks calling with [facility] Callback Number: [phone] secured line yes  Service Requested: Skilled Nursing Frequency: 1w6 Any new concerns about the patient? No  Patient refused [facility] wants to rescheduling next Tuesday 03/27/24 [facility] leaving this note about rescheduling and refusal)  Home was advised and Pt has been discharged from the hospital. Pt delayed home care today and will resume per [place]. KH

## 2024-03-27 DIAGNOSIS — I1A0 Resistant hypertension: Secondary | ICD-10-CM | POA: Diagnosis not present

## 2024-03-27 DIAGNOSIS — I132 Hypertensive heart and chronic kidney disease with heart failure and with stage 5 chronic kidney disease, or end stage renal disease: Secondary | ICD-10-CM | POA: Diagnosis not present

## 2024-03-27 DIAGNOSIS — N185 Chronic kidney disease, stage 5: Secondary | ICD-10-CM | POA: Diagnosis not present

## 2024-03-27 DIAGNOSIS — J439 Emphysema, unspecified: Secondary | ICD-10-CM | POA: Diagnosis not present

## 2024-03-27 DIAGNOSIS — E1122 Type 2 diabetes mellitus with diabetic chronic kidney disease: Secondary | ICD-10-CM | POA: Diagnosis not present

## 2024-03-27 DIAGNOSIS — I5033 Acute on chronic diastolic (congestive) heart failure: Secondary | ICD-10-CM | POA: Diagnosis not present

## 2024-03-28 ENCOUNTER — Telehealth: Payer: Self-pay | Admitting: Diagnostic Neuroimaging

## 2024-03-28 NOTE — Telephone Encounter (Signed)
 LVM and sent MyChart message informing pt of r/s needed for 7/15 appt- MD out.

## 2024-03-29 ENCOUNTER — Encounter: Payer: Self-pay | Admitting: Internal Medicine

## 2024-03-30 MED ORDER — ACCU-CHEK GUIDE TEST VI STRP: ORAL_STRIP | 12 refills | Status: DC

## 2024-04-02 DIAGNOSIS — Z992 Dependence on renal dialysis: Secondary | ICD-10-CM | POA: Diagnosis not present

## 2024-04-02 DIAGNOSIS — E1165 Type 2 diabetes mellitus with hyperglycemia: Secondary | ICD-10-CM | POA: Diagnosis not present

## 2024-04-02 DIAGNOSIS — I110 Hypertensive heart disease with heart failure: Secondary | ICD-10-CM | POA: Diagnosis not present

## 2024-04-02 DIAGNOSIS — N186 End stage renal disease: Secondary | ICD-10-CM | POA: Diagnosis not present

## 2024-04-02 DIAGNOSIS — J449 Chronic obstructive pulmonary disease, unspecified: Secondary | ICD-10-CM | POA: Diagnosis not present

## 2024-04-03 ENCOUNTER — Institutional Professional Consult (permissible substitution): Admitting: Diagnostic Neuroimaging

## 2024-04-04 DIAGNOSIS — E1122 Type 2 diabetes mellitus with diabetic chronic kidney disease: Secondary | ICD-10-CM | POA: Diagnosis not present

## 2024-04-04 DIAGNOSIS — I1A0 Resistant hypertension: Secondary | ICD-10-CM | POA: Diagnosis not present

## 2024-04-04 DIAGNOSIS — J439 Emphysema, unspecified: Secondary | ICD-10-CM | POA: Diagnosis not present

## 2024-04-04 DIAGNOSIS — N185 Chronic kidney disease, stage 5: Secondary | ICD-10-CM | POA: Diagnosis not present

## 2024-04-04 DIAGNOSIS — I5033 Acute on chronic diastolic (congestive) heart failure: Secondary | ICD-10-CM | POA: Diagnosis not present

## 2024-04-04 DIAGNOSIS — I132 Hypertensive heart and chronic kidney disease with heart failure and with stage 5 chronic kidney disease, or end stage renal disease: Secondary | ICD-10-CM | POA: Diagnosis not present

## 2024-04-05 NOTE — Telephone Encounter (Signed)
 Pt returned call and has been scheduled

## 2024-04-09 ENCOUNTER — Ambulatory Visit (INDEPENDENT_AMBULATORY_CARE_PROVIDER_SITE_OTHER): Admitting: Diagnostic Neuroimaging

## 2024-04-09 ENCOUNTER — Encounter: Payer: Self-pay | Admitting: Diagnostic Neuroimaging

## 2024-04-09 VITALS — BP 162/67 | HR 58 | Ht 66.0 in | Wt 179.0 lb

## 2024-04-09 DIAGNOSIS — I129 Hypertensive chronic kidney disease with stage 1 through stage 4 chronic kidney disease, or unspecified chronic kidney disease: Secondary | ICD-10-CM | POA: Diagnosis not present

## 2024-04-09 DIAGNOSIS — G8929 Other chronic pain: Secondary | ICD-10-CM | POA: Diagnosis not present

## 2024-04-09 DIAGNOSIS — E1340 Other specified diabetes mellitus with diabetic neuropathy, unspecified: Secondary | ICD-10-CM | POA: Diagnosis not present

## 2024-04-09 DIAGNOSIS — R269 Unspecified abnormalities of gait and mobility: Secondary | ICD-10-CM

## 2024-04-09 DIAGNOSIS — M25562 Pain in left knee: Secondary | ICD-10-CM

## 2024-04-09 DIAGNOSIS — Z8673 Personal history of transient ischemic attack (TIA), and cerebral infarction without residual deficits: Secondary | ICD-10-CM

## 2024-04-09 DIAGNOSIS — M25561 Pain in right knee: Secondary | ICD-10-CM

## 2024-04-09 DIAGNOSIS — I5030 Unspecified diastolic (congestive) heart failure: Secondary | ICD-10-CM | POA: Diagnosis not present

## 2024-04-09 DIAGNOSIS — N184 Chronic kidney disease, stage 4 (severe): Secondary | ICD-10-CM | POA: Diagnosis not present

## 2024-04-09 DIAGNOSIS — N2581 Secondary hyperparathyroidism of renal origin: Secondary | ICD-10-CM | POA: Diagnosis not present

## 2024-04-09 DIAGNOSIS — I770 Arteriovenous fistula, acquired: Secondary | ICD-10-CM | POA: Diagnosis not present

## 2024-04-09 DIAGNOSIS — D631 Anemia in chronic kidney disease: Secondary | ICD-10-CM | POA: Diagnosis not present

## 2024-04-09 DIAGNOSIS — R0989 Other specified symptoms and signs involving the circulatory and respiratory systems: Secondary | ICD-10-CM

## 2024-04-09 DIAGNOSIS — E785 Hyperlipidemia, unspecified: Secondary | ICD-10-CM | POA: Diagnosis not present

## 2024-04-09 NOTE — Progress Notes (Signed)
 [facility]  PATIENT: [name] DOB: April 24, 1946  REFERRING CLINICIAN: Dennise Hoes, MD HISTORY FROM: patient  REASON FOR VISIT: new consult    HISTORICAL  CHIEF COMPLAINT:  Chief Complaint  Patient presents with   Gait Problem    Rm 6 with spouse  Pt is well, reports she has been having trouble with her balance and gait for at least 6 months. She mentions she does having neuropathy and intermittent inner ear issues.     HISTORY OF PRESENT ILLNESS:   UPDATE (04/09/24, VRP): 78 year old female here for evaluation of gait difficulty. Since last visit, now having worsening renal function, and planning to start dialysis. Having progressive gait and balance difficulty over last 1-2 years. No acute or sudden changes.   UPDATE (11/18/20, VRP): Since last visit, more blurred vision. Went to ER for left left ear pain; had CT head for eval and found to have age indeterminate stroke in left occipital region. Has had general blurred vision for past 5 months, seeing ophthal.   Dr. Jerri: 04/12/17 PRIOR HPI: 78 y.o. female with history of brain aneurysm, diabetes, hyperlipidemia, hypertension, migraines, PVD s/p fem-pop bypass and stent, TIA admitted on 01/25/17 for Right facial droop with Right-sided weakness with decreased sensation. MRI showed small left lateral thalamus and IC infarct as well as old right cerebellar infarct. MRA showed moderate left PCA stenosis and right ICA siphon 3mm aneurysm. CTA neck b/l ICA 30% and left CCA 50% stenosis, b/l VA mild stenosis. EF 60-65%. LDL 92 and A1C 8.9. She has plavix allergy, so continued on ASA and zocor  on discharge.   REVIEW OF SYSTEMS: Full 14 system review of systems performed and negative with exception of: as per HPI.  ALLERGIES: Allergies  Allergen Reactions   Ciprofloxacin  Swelling   Ozempic  (0.25 Or 0.5 Mg-Dose) [Semaglutide (0.25 Or 0.5mg -Dos)] Nausea And Vomiting   Plavix [Clopidogrel Bisulfate] Palpitations    Amoxicillin Itching and Swelling    FACE & EYES SWELL   Ace Inhibitors Cough    Sore throat   Atorvastatin      myalgias   Lyrica [Pregabalin] Itching and Nausea Only     gain weight   Penicillins Other (See Comments)    Pt unsure if there is an allergic reaction     HOME MEDICATIONS: Outpatient Medications Prior to Visit  Medication Sig Dispense Refill   acetaminophen  (TYLENOL ) 500 MG tablet Take 1,000 mg by mouth 2 (two) times daily as needed for mild pain (pain score 1-3).     amLODipine  (NORVASC ) 10 MG tablet Take 1 tablet (10 mg total) by mouth daily. 30 tablet 0   aspirin  EC 81 MG tablet Take 1 tablet (81 mg total) by mouth daily. Swallow whole. 30 tablet 12   busPIRone  (BUSPAR ) 5 MG tablet Take 1 tablet (5 mg total) by mouth 2 (two) times daily.     calcitRIOL  (ROCALTROL ) 0.25 MCG capsule Take 0.25 mcg by mouth in the morning.     cloNIDine  (CATAPRES ) 0.1 MG tablet Take 1 tablet (0.1 mg total) by mouth at bedtime. 60 tablet 11   Fluticasone -Umeclidin-Vilant (TRELEGY ELLIPTA ) 200-62.5-25 MCG/ACT AEPB Inhale 1 puff into the lungs daily. 3 each 3   furosemide  (LASIX ) 40 MG tablet Take 80 mg by mouth 2 (two) times daily.     glucose blood (ACCU-CHEK GUIDE TEST) test strip Use to check blood sugar once a day 100 each 12   hydrALAZINE  (APRESOLINE ) 100 MG tablet Take 1 tablet (100 mg total)  by mouth every 8 (eight) hours. 270 tablet 3   insulin  aspart (NOVOLOG  FLEXPEN) 100 UNIT/ML FlexPen Inject 10-14 Units into the skin 3 (three) times daily before meals. (Patient taking differently: Inject 10-14 Units into the skin 3 (three) times daily before meals. Per sliding scale) 45 mL 3   insulin  degludec (TRESIBA  FLEXTOUCH) 200 UNIT/ML FlexTouch Pen Inject 40 Units into the skin daily. 18 mL 3   isosorbide  mononitrate (IMDUR ) 60 MG 24 hr tablet Take 1 tablet (60 mg total) by mouth daily. 30 tablet 0   meclizine  (ANTIVERT ) 25 MG tablet 1/2 tab up to 3 times daily for motion sickness/dizziness.  (Patient taking differently: Take 25 mg by mouth daily as needed for dizziness or nausea.) 30 tablet 0   metolazone (ZAROXOLYN) 5 MG tablet Take 5 mg by mouth every other day.     metoprolol  tartrate (LOPRESSOR ) 25 MG tablet Take 1 tablet (25 mg total) by mouth 2 (two) times daily. 180 tablet 3   Omega-3 Fatty Acids (FISH OIL) 1000 MG CAPS Take 1,000 mg by mouth in the morning and at bedtime.     Potassium Chloride  ER 20 MEQ TBCR Take 2 tablets (40 mEq total) by mouth every other day. 60 tablet 0   rOPINIRole  (REQUIP ) 0.25 MG tablet Take 1 tablet (0.25 mg total) by mouth at bedtime. Take 0.25 mg by mouth at bedtime. 90 tablet 1   rosuvastatin  (CRESTOR ) 10 MG tablet Take 1 tablet (10 mg total) by mouth daily. 90 tablet 3   No facility-administered medications prior to visit.    PAST MEDICAL HISTORY: 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,  small   Anxiety    Arthritis    Cataract    bilateral - surgery   CKD (chronic kidney disease), stage IV (HCC)    followed by [facility]   Colon polyps    Depression    Diabetes mellitus 1998   type 2   Diverticulosis    Emphysema of lung (HCC)    no one has evry told me that   ESOPHAGEAL STRICTURE 08/27/2008   Family history of adverse reaction to anesthesia    daughter n/v   GERD (gastroesophageal reflux disease)    Glaucoma    bilateral   Heart failure (HCC) 01/04/2024   Hemorrhoids    hx   Hiatal hernia    Hyperlipidemia    Hypertension 2000   Migraines    Neuropathy    fingers and toes   Peripheral vascular disease (HCC)    Phlebitis    30 years ago  left leg   Stroke [facility])    multiple mini strokes ( brain aneurysm ).  Right side weaker. 2 strokes    PAST SURGICAL HISTORY: Past Surgical History:  Procedure Laterality Date   ABDOMINAL AORTAGRAM N/A 01/04/2012   Procedure: ABDOMINAL AORTAGRAM;  Surgeon:  Gaile LELON New, MD;  Location: [facility] CATH LAB;  Service: Cardiovascular;  Laterality: N/A;   ABDOMINAL AORTAGRAM N/A 08/15/2012   Procedure: ABDOMINAL EZELLA;  Surgeon: Gaile LELON New, MD;  Location: [facility] CATH LAB;  Service: Cardiovascular;  Laterality: N/A;   ABDOMINAL AORTOGRAM W/LOWER EXTREMITY Left 11/17/2021   Procedure: ABDOMINAL AORTOGRAM W/LOWER EXTREMITY;  Surgeon: New Gaile LELON, MD;  Location: [facility] INVASIVE CV LAB;  Service: Cardiovascular;  Laterality: Left;   ABDOMINAL AORTOGRAM W/LOWER EXTREMITY N/A 03/09/2022  Procedure: ABDOMINAL AORTOGRAM W/LOWER EXTREMITY;  Surgeon: Serene Gaile ORN, MD;  Location: [facility] INVASIVE CV LAB;  Service: Cardiovascular;  Laterality: N/A;   ABDOMINAL AORTOGRAM W/LOWER EXTREMITY N/A 03/15/2023   Procedure: ABDOMINAL AORTOGRAM W/LOWER EXTREMITY;  Surgeon: Serene Gaile ORN, MD;  Location: [facility] INVASIVE CV LAB;  Service: Cardiovascular;  Laterality: N/A;   ABDOMINAL HYSTERECTOMY  1984   AV FISTULA PLACEMENT Right 12/21/2023   Procedure: RIGHT ARM BRACHIOCEPHALIC ARTERIOVENOUS (AV) FISTULA CREATION;  Surgeon: Serene Gaile ORN, MD;  Location: [facility] OR;  Service: Vascular;  Laterality: Right;   BREAST SURGERY     cataract surgery Right 10/22/2015   cataract surgery Left 11/05/2015   CESAREAN SECTION     CHOLECYSTECTOMY     COLONOSCOPY  07/2010   DENTAL SURGERY  05/05/2012   left lower    ESOPHAGOGASTRODUODENOSCOPY  07/2010   EYE SURGERY  07/2013   Laser-Glaucoma   FEMORAL ARTERY STENT  05/11/2011   Left superficial femoral and popliteal artery   FEMORAL-POPLITEAL BYPASS GRAFT Left 11/18/2021   Procedure: LEFT FEMORAL-POPLITEAL ARTERY BYPASS GRAFT;  Surgeon: Serene Gaile ORN, MD;  Location: [facility] OR;  Service: Vascular;  Laterality: Left;   FISTULA SUPERFICIALIZATION Right 02/01/2024   Procedure: RIGHT AV FISTULA SUPERFICIALIZATION;  Surgeon: Serene Gaile ORN, MD;  Location: [facility] OR;  Service: Vascular;  Laterality: Right;   HERNIA REPAIR     times two   KNEE  SURGERY     LOWER EXTREMITY ANGIOGRAM Left 08/15/2012   Procedure: LOWER EXTREMITY ANGIOGRAM;  Surgeon: Gaile ORN Serene, MD;  Location: [facility] CATH LAB;  Service: Cardiovascular;  Laterality: Left;  lt leg angio   PERIPHERAL VASCULAR BALLOON ANGIOPLASTY Left 03/09/2022   Procedure: PERIPHERAL VASCULAR BALLOON ANGIOPLASTY;  Surgeon: Serene Gaile ORN, MD;  Location: [facility] INVASIVE CV LAB;  Service: Cardiovascular;  Laterality: Left;   PERIPHERAL VASCULAR BALLOON ANGIOPLASTY Left 03/15/2023   Procedure: PERIPHERAL VASCULAR BALLOON ANGIOPLASTY;  Surgeon: Serene Gaile ORN, MD;  Location: [facility] INVASIVE CV LAB;  Service: Cardiovascular;  Laterality: Left;   rotator cuff surgery     THYROID  SURGERY      FAMILY HISTORY: Family History  Problem Relation Age of Onset   CAD Mother 38       Died of MI   Hypertension Mother    Heart attack Mother    Heart disease Mother    CAD Father 44       Died of MI   Heart disease Father    Heart attack Father    CAD Brother 40       Two brothers died of MI   Heart attack Brother    Heart disease Brother        Amputation   Diabetes Sister    Hypertension Sister    Heart attack Brother    Heart disease Brother    Stroke Sister    Colon cancer Neg Hx    Stomach cancer Neg Hx    Esophageal cancer Neg Hx     SOCIAL HISTORY: Social History   Socioeconomic History   Marital status: Married    Spouse name: Not on file   Number of children: 2   Years of education: Not on file   Highest education level: 12th grade  Occupational History   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  Tobacco Use   Smoking status: Every Day    Current packs/day: 0.25    Average packs/day: 0.4 packs/day for 48.4 years (20.1 ttl pk-yrs)  Types: Cigarettes    Start date: 11/30/2023    Passive exposure: Never   Smokeless tobacco: Never   Tobacco comments:    11/18/20 in process of quiting    03/31/23 5 cigarettes a day   Vaping Use   Vaping status: Never Used   Substance and Sexual Activity   Alcohol  use: Never   Drug use: Never   Sexual activity: Not Currently    Birth control/protection: None, Surgical    Comment: Hysterctomy  Other Topics Concern   Not on file  Social History Narrative   Lives with husband.        Social Drivers of Corporate investment banker Strain: Low Risk  (09/19/2023)   Overall Financial Resource Strain (CARDIA)    Difficulty of Paying Living Expenses: Not hard at all  Food Insecurity: No Food Insecurity (03/21/2024)   Hunger Vital Sign    Worried About Running Out of Food in the Last Year: Never true    Ran Out of Food in the Last Year: Never true  Transportation Needs: No Transportation Needs (03/21/2024)   PRAPARE - Administrator, Civil Service (Medical): No    Lack of Transportation (Non-Medical): No  Physical Activity: Unknown (09/19/2023)   Exercise Vital Sign    Days of Exercise per Week: 0 days    Minutes of Exercise per Session: Not on file  Stress: Stress Concern Present (09/19/2023)   [employer] of Occupational Health - Occupational Stress Questionnaire    Feeling of Stress : Very much  Social Connections: Socially Integrated (03/21/2024)   Social Connection and Isolation Panel    Frequency of Communication with Friends and Family: More than three times a week    Frequency of Social Gatherings with Friends and Family: More than three times a week    Attends Religious Services: More than 4 times per year    Active Member of [employer] or Organizations: Yes    Attends Banker Meetings: 1 to 4 times per year    Marital Status: Married  Catering manager Violence: Not At Risk (03/21/2024)   Humiliation, Afraid, Rape, and Kick questionnaire    Fear of Current or Ex-Partner: No    Emotionally Abused: No    Physically Abused: No    Sexually Abused: No     PHYSICAL EXAM  GENERAL EXAM/CONSTITUTIONAL: Vitals:  Vitals:   04/09/24 1041 04/09/24 1044  BP: (!) 170/56 (!) 162/67   Pulse: (!) 59 (!) 58  Weight: 179 lb (81.2 kg)   Height: 5' 6 (1.676 m)    Body mass index is 28.89 kg/m. Wt Readings from Last 3 Encounters:  04/09/24 179 lb (81.2 kg)  03/21/24 177 lb 0.5 oz (80.3 kg)  03/20/24 179 lb (81.2 kg)   Patient is in no distress; well developed, nourished and groomed; neck is supple  CARDIOVASCULAR: Examination of carotid arteries is normal; HARSH RIGHT CAROTID BRUIT Regular rate and rhythm, no murmurs Examination of peripheral vascular system by observation and palpation is normal  EYES: Ophthalmoscopic exam of optic discs and posterior segments is normal; no papilledema or hemorrhages No results found.  MUSCULOSKELETAL: Gait, strength, tone, movements noted in Neurologic exam below  NEUROLOGIC: MENTAL STATUS:      No data to display         awake, alert, oriented to person, place and time recent and remote memory intact normal attention and concentration language fluent, comprehension intact, naming intact fund of knowledge appropriate  CRANIAL NERVE:  2nd - no papilledema on fundoscopic exam 2nd, 3rd, 4th, 6th - pupils equal and reactive to light, visual fields full to confrontation, extraocular muscles intact, no nystagmus 5th - facial sensation symmetric 7th - facial strength symmetric 8th - hearing intact 9th - palate elevates symmetrically, uvula midline 11th - shoulder shrug symmetric 12th - tongue protrusion midline  MOTOR:  normal bulk and tone; DIFFUSE 4/5 STRENGTH  SENSORY:  normal and symmetric to light touch, temperature, vibration; DECR IN LEGS / FEET  COORDINATION:  finger-nose-finger, fine finger movements SLOW   REFLEXES:  deep tendon reflexes TRACE and symmetric  GAIT/STATION:  narrow based gait     DIAGNOSTIC DATA (LABS, IMAGING, TESTING) - I reviewed patient records, labs, notes, testing and imaging myself where available.  Lab Results  Component Value Date   WBC 10.2 03/21/2024   HGB 10.7  (L) 03/21/2024   HCT 32.2 (L) 03/21/2024   MCV 97.9 03/21/2024   PLT 237 03/21/2024      Component Value Date/Time   NA 135 03/21/2024 0044   NA 141 09/19/2023 1455   K 2.5 (LL) 03/21/2024 0044   CL 88 (L) 03/21/2024 0044   CO2 29 03/21/2024 0044   GLUCOSE 301 (H) 03/21/2024 0044   BUN 90 (H) 03/21/2024 0044   BUN 41 (H) 09/19/2023 1455   CREATININE 8.48 (H) 03/21/2024 0044   CREATININE 2.81 (H) 08/08/2023 1506   CALCIUM  8.2 (L) 03/21/2024 0044   PROT 6.8 03/20/2024 1620   PROT 7.1 03/31/2023 1639   ALBUMIN  2.5 (L) 03/21/2024 0044   ALBUMIN  3.9 03/31/2023 1639   AST 32 03/20/2024 1620   ALT 20 03/20/2024 1620   ALKPHOS 67 03/20/2024 1620   BILITOT 0.7 03/20/2024 1620   BILITOT 0.3 03/31/2023 1639   GFRNONAA 4 (L) 03/21/2024 0044   GFRNONAA 28 (L) 12/15/2020 1137   GFRAA 32 (L) 12/15/2020 1137   Lab Results  Component Value Date   CHOL 249 (H) 08/08/2023   HDL 57 08/08/2023   LDLCALC 166 (H) 08/08/2023   LDLDIRECT 85 01/23/2024   TRIG 129 08/08/2023   CHOLHDL 4.4 08/08/2023   Lab Results  Component Value Date   HGBA1C 8.2 (A) 03/05/2024   Lab Results  Component Value Date   VITAMINB12 554 08/28/2022   Lab Results  Component Value Date   TSH 2.44 08/08/2023    07/19/16 CTA head / neck 1. No evidence of acute intracranial abnormality. 2. Chronic right cerebellar infarcts. 3. No large vessel occlusion. 4. 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 5. Unchanged intracranial and extracranial atherosclerosis including moderate to severe left P2 PCA stenosis, 50% left common carotid artery stenosis, and 30% bilateral proximal ICA stenoses.  01/25/17 MRA head - Moderate stenosis left posterior cerebral artery. No other significant intracranial stenosis. - Mild irregularity the anterior genu of the cavernous carotid on the right with mild bulging projecting inferiorly is unchanged from prior studies. This may represent atherosclerotic irregularity or a small  3 x 1 mm aneurysm, stable from the prior study.  01/25/17 MRI brain - 1 cm acute infarct left lateral thalamus and internal capsule - Chronic infarct right cerebellum  03/05/19 MRA head - Abnormal MRA of the brain showing moderate stenosis of the left posterior cerebral artery in its midsection.  There is mild irregularity of the right cavernous carotid with 2 to 3 mm irregular outpouching which may represent small aneurysm  .  10/03/20 BLE ABI  - Right: Resting right ankle-brachial index indicates severe  right lower  extremity arterial disease. The right toe-brachial index is abnormal.  - Left: Resting left ankle-brachial index indicates severe left lower  extremity arterial disease. The left toe-brachial index is abnormal.   10/03/20 carotid u/s Right Carotid: Velocities in the right ICA are consistent with a 1-39%  stenosis.   Left Carotid: Velocities in the left ICA are consistent with a 1-39%  stenosis.  There is hemodynamically significant plaque with >50%  stenosis in the left mid to distal CCA.   10/28/20 CT head  1. 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 2. Stable chronic ischemic changes right cerebellar hemisphere. 3. No acute hemorrhage.  08/28/22 MRI brain 1. Mildly motion degraded exam with no acute intracranial abnormality. 2. Advanced chronic ischemic disease appears stable from an MRI last year.  08/09/23 carotid u/s - Right Carotid: Velocities in the right ICA are consistent with a 40-59%                 stenosis. Hemodynamically significant plaque >50%  visualized in the proximal CCA.   Left Carotid: Velocities in the left ICA are consistent with a 1-39%  stenosis. Hemodynamically significant plaque >50% visualized in the mid-distal CCA.   Vertebrals:  Bilateral vertebral arteries demonstrate antegrade flow.  Subclavians: Right subclavian artery flow was disturbed. Normal flow               hemodynamics were seen in the  left subclavian artery.     ASSESSMENT AND PLAN  78 y.o. year old female here with unruptured brain aneurysm (vs atherosclerosis), diabetes, hyperlipidemia, hypertension, migraines, peripheral vascular disease, with left thalamic stroke on 01/25/17.  Dx:  1. Gait difficulty   2. Neuropathy due to secondary diabetes (HCC)   3. Chronic pain of both knees   4. Right carotid bruit   5. History of CVA (cerebrovascular accident)     PLAN:  GAIT DIFFICULTY (due to diabetic neuropathy; also ESRD neuropathy possible; chronic hip and knee pain; peripheral vascular disease) - continue supportive care; continue PT exercises - consider pain mgmt (low dose gabapentin 100-200mg  at bedtime - consider palliative care approach (patient is overwhelmed by number of consults, tests, medications etc)  RIGHT CAROTID BRUIT / RIGHT CCA STENOSIS - follow up with vascular surgery  HISTORY OF LEFT THALAMIC AND LEFT PARIETO-OCCIPITAL INFARCTIONS - continue aspirin  81mg  daily (intolerant of plavix in past) - continue BP, DM and lipid control per PCP, endocrinology, nephrology - smoking cessation reviewed; has cut down from past  RIGHT CAVERNOUS CAROTID ATHEROSCLEROSIS (vs aneurysm) - stable on last MRA from 2020 going back to 2014 - continue medical mgmt  Return for return to PCP, pending if symptoms worsen or fail to improve.  I spent 45 minutes of face-to-face and non-face-to-face time with patient.  This included previsit chart review, lab review, study review, order entry, electronic health record documentation, patient education.        EDUARD FABIENE HANLON, MD 04/09/2024, 11:40 AM Certified in Neurology, Neurophysiology and Neuroimaging  [facility] [address] [place], [place] [address] [phone]

## 2024-04-09 NOTE — Patient Instructions (Signed)
  GAIT DIFFICULTY (due to diabetic neuropathy; also ESRD neuropathy possible; chronic hip and knee pain; peripheral vascular disease) - continue supportive care; continue PT exercises - consider pain mgmt (low dose gabapentin 100-200mg  at bedtime - consider palliative care approach (patient is overwhelmed by number of consults, tests, medications etc)  RIGHT CAROTID BRUIT / RIGHT CCA STENOSIS - follow up with vascular surgery  HISTORY OF LEFT THALAMIC AND LEFT PARIETO-OCCIPITAL INFARCTIONS - continue aspirin  81mg  daily (intolerant of plavix in past) - continue BP, DM and lipid control per PCP, endocrinology, nephrology - smoking cessation reviewed; has cut down from past

## 2024-04-10 DIAGNOSIS — K219 Gastro-esophageal reflux disease without esophagitis: Secondary | ICD-10-CM | POA: Diagnosis not present

## 2024-04-10 DIAGNOSIS — F17210 Nicotine dependence, cigarettes, uncomplicated: Secondary | ICD-10-CM | POA: Diagnosis not present

## 2024-04-10 DIAGNOSIS — I69351 Hemiplegia and hemiparesis following cerebral infarction affecting right dominant side: Secondary | ICD-10-CM | POA: Diagnosis not present

## 2024-04-10 DIAGNOSIS — E1142 Type 2 diabetes mellitus with diabetic polyneuropathy: Secondary | ICD-10-CM | POA: Diagnosis not present

## 2024-04-10 DIAGNOSIS — I083 Combined rheumatic disorders of mitral, aortic and tricuspid valves: Secondary | ICD-10-CM | POA: Diagnosis not present

## 2024-04-10 DIAGNOSIS — E876 Hypokalemia: Secondary | ICD-10-CM | POA: Diagnosis not present

## 2024-04-10 DIAGNOSIS — I5033 Acute on chronic diastolic (congestive) heart failure: Secondary | ICD-10-CM | POA: Diagnosis not present

## 2024-04-10 DIAGNOSIS — I132 Hypertensive heart and chronic kidney disease with heart failure and with stage 5 chronic kidney disease, or end stage renal disease: Secondary | ICD-10-CM | POA: Diagnosis not present

## 2024-04-10 DIAGNOSIS — G43909 Migraine, unspecified, not intractable, without status migrainosus: Secondary | ICD-10-CM | POA: Diagnosis not present

## 2024-04-10 DIAGNOSIS — Z794 Long term (current) use of insulin: Secondary | ICD-10-CM | POA: Diagnosis not present

## 2024-04-10 DIAGNOSIS — K5790 Diverticulosis of intestine, part unspecified, without perforation or abscess without bleeding: Secondary | ICD-10-CM | POA: Diagnosis not present

## 2024-04-10 DIAGNOSIS — E1169 Type 2 diabetes mellitus with other specified complication: Secondary | ICD-10-CM | POA: Diagnosis not present

## 2024-04-10 DIAGNOSIS — F411 Generalized anxiety disorder: Secondary | ICD-10-CM | POA: Diagnosis not present

## 2024-04-10 DIAGNOSIS — N186 End stage renal disease: Secondary | ICD-10-CM | POA: Diagnosis not present

## 2024-04-10 DIAGNOSIS — Z7951 Long term (current) use of inhaled steroids: Secondary | ICD-10-CM | POA: Diagnosis not present

## 2024-04-10 DIAGNOSIS — F3341 Major depressive disorder, recurrent, in partial remission: Secondary | ICD-10-CM | POA: Diagnosis not present

## 2024-04-10 DIAGNOSIS — I700 Atherosclerosis of aorta: Secondary | ICD-10-CM | POA: Diagnosis not present

## 2024-04-10 DIAGNOSIS — I2510 Atherosclerotic heart disease of native coronary artery without angina pectoris: Secondary | ICD-10-CM | POA: Diagnosis not present

## 2024-04-10 DIAGNOSIS — I1A0 Resistant hypertension: Secondary | ICD-10-CM | POA: Diagnosis not present

## 2024-04-10 DIAGNOSIS — E785 Hyperlipidemia, unspecified: Secondary | ICD-10-CM | POA: Diagnosis not present

## 2024-04-10 DIAGNOSIS — R911 Solitary pulmonary nodule: Secondary | ICD-10-CM | POA: Diagnosis not present

## 2024-04-10 DIAGNOSIS — N179 Acute kidney failure, unspecified: Secondary | ICD-10-CM | POA: Diagnosis not present

## 2024-04-10 DIAGNOSIS — E1151 Type 2 diabetes mellitus with diabetic peripheral angiopathy without gangrene: Secondary | ICD-10-CM | POA: Diagnosis not present

## 2024-04-10 DIAGNOSIS — J439 Emphysema, unspecified: Secondary | ICD-10-CM | POA: Diagnosis not present

## 2024-04-10 DIAGNOSIS — E1122 Type 2 diabetes mellitus with diabetic chronic kidney disease: Secondary | ICD-10-CM | POA: Diagnosis not present

## 2024-04-13 DIAGNOSIS — N184 Chronic kidney disease, stage 4 (severe): Secondary | ICD-10-CM | POA: Diagnosis not present

## 2024-04-13 DIAGNOSIS — N186 End stage renal disease: Secondary | ICD-10-CM | POA: Diagnosis not present

## 2024-04-16 ENCOUNTER — Telehealth (HOSPITAL_COMMUNITY): Payer: Self-pay | Admitting: Pharmacy Technician

## 2024-04-16 NOTE — Telephone Encounter (Signed)
 Auth Submission: NO AUTH NEEDED Site of care: [facility] Payer: Medicare A/B, Cigna NALC Medication & CPT/J Code(s) submitted: Feraheme (ferumoxytol) [id number] Diagnosis Code: D63.1 Route of submission (phone, fax, portal):   Phone # Fax # Auth type: Buy/Bill HB Units/visits requested: 510mg  x 2 doses Reference number:  Approval from: 04/16/24 to 08/17/24   Medicare A/B will cover 80%, Cigna NALC is secondary will cover remaining 20%. Med will be covered at 100%.    Luka Stohr, CPhT [facility] Phone: [phone] 04/16/2024

## 2024-04-20 DIAGNOSIS — Z992 Dependence on renal dialysis: Secondary | ICD-10-CM | POA: Diagnosis not present

## 2024-04-20 DIAGNOSIS — N186 End stage renal disease: Secondary | ICD-10-CM | POA: Diagnosis not present

## 2024-04-20 DIAGNOSIS — E1121 Type 2 diabetes mellitus with diabetic nephropathy: Secondary | ICD-10-CM | POA: Diagnosis not present

## 2024-04-24 ENCOUNTER — Other Ambulatory Visit (HOSPITAL_COMMUNITY): Payer: Self-pay

## 2024-04-25 ENCOUNTER — Encounter (HOSPITAL_COMMUNITY)
Admission: RE | Admit: 2024-04-25 | Discharge: 2024-04-25 | Disposition: A | Discharge status: Home / Self Care | Source: Ambulatory Visit | Attending: Nephrology | Admitting: Nephrology

## 2024-04-25 DIAGNOSIS — N189 Chronic kidney disease, unspecified: Secondary | ICD-10-CM | POA: Insufficient documentation

## 2024-04-25 DIAGNOSIS — D631 Anemia in chronic kidney disease: Secondary | ICD-10-CM | POA: Insufficient documentation

## 2024-04-25 MED ORDER — SODIUM CHLORIDE 0.9 % IV SOLN
510.0000 mg | INTRAVENOUS | Status: DC
  Administered 2024-04-25: 510 mg via INTRAVENOUS
  Filled 2024-04-25: qty 510

## 2024-04-27 ENCOUNTER — Inpatient Hospital Stay (HOSPITAL_COMMUNITY)
Admission: EM | Admit: 2024-04-27 | Discharge: 2024-05-02 | DRG: 191 | Disposition: A | Discharge status: Home / Self Care | Attending: Internal Medicine | Admitting: Internal Medicine

## 2024-04-27 DIAGNOSIS — Z823 Family history of stroke: Secondary | ICD-10-CM

## 2024-04-27 DIAGNOSIS — R079 Chest pain, unspecified: Secondary | ICD-10-CM | POA: Diagnosis not present

## 2024-04-27 DIAGNOSIS — R0602 Shortness of breath: Secondary | ICD-10-CM | POA: Diagnosis not present

## 2024-04-27 DIAGNOSIS — E1165 Type 2 diabetes mellitus with hyperglycemia: Secondary | ICD-10-CM | POA: Diagnosis not present

## 2024-04-27 DIAGNOSIS — E785 Hyperlipidemia, unspecified: Secondary | ICD-10-CM | POA: Diagnosis present

## 2024-04-27 DIAGNOSIS — I132 Hypertensive heart and chronic kidney disease with heart failure and with stage 5 chronic kidney disease, or end stage renal disease: Secondary | ICD-10-CM | POA: Diagnosis present

## 2024-04-27 DIAGNOSIS — K449 Diaphragmatic hernia without obstruction or gangrene: Secondary | ICD-10-CM | POA: Diagnosis not present

## 2024-04-27 DIAGNOSIS — Z880 Allergy status to penicillin: Secondary | ICD-10-CM

## 2024-04-27 DIAGNOSIS — N184 Chronic kidney disease, stage 4 (severe): Secondary | ICD-10-CM | POA: Diagnosis not present

## 2024-04-27 DIAGNOSIS — Z716 Tobacco abuse counseling: Secondary | ICD-10-CM

## 2024-04-27 DIAGNOSIS — Z794 Long term (current) use of insulin: Secondary | ICD-10-CM | POA: Diagnosis not present

## 2024-04-27 DIAGNOSIS — I69351 Hemiplegia and hemiparesis following cerebral infarction affecting right dominant side: Secondary | ICD-10-CM | POA: Diagnosis not present

## 2024-04-27 DIAGNOSIS — T380X5A Adverse effect of glucocorticoids and synthetic analogues, initial encounter: Secondary | ICD-10-CM | POA: Diagnosis not present

## 2024-04-27 DIAGNOSIS — R7989 Other specified abnormal findings of blood chemistry: Secondary | ICD-10-CM | POA: Diagnosis not present

## 2024-04-27 DIAGNOSIS — Z8249 Family history of ischemic heart disease and other diseases of the circulatory system: Secondary | ICD-10-CM

## 2024-04-27 DIAGNOSIS — I120 Hypertensive chronic kidney disease with stage 5 chronic kidney disease or end stage renal disease: Secondary | ICD-10-CM | POA: Diagnosis not present

## 2024-04-27 DIAGNOSIS — I160 Hypertensive urgency: Secondary | ICD-10-CM | POA: Diagnosis not present

## 2024-04-27 DIAGNOSIS — Z888 Allergy status to other drugs, medicaments and biological substances status: Secondary | ICD-10-CM

## 2024-04-27 DIAGNOSIS — H409 Unspecified glaucoma: Secondary | ICD-10-CM | POA: Diagnosis present

## 2024-04-27 DIAGNOSIS — K219 Gastro-esophageal reflux disease without esophagitis: Secondary | ICD-10-CM | POA: Diagnosis present

## 2024-04-27 DIAGNOSIS — J439 Emphysema, unspecified: Secondary | ICD-10-CM | POA: Diagnosis not present

## 2024-04-27 DIAGNOSIS — I5032 Chronic diastolic (congestive) heart failure: Secondary | ICD-10-CM | POA: Diagnosis present

## 2024-04-27 DIAGNOSIS — F17210 Nicotine dependence, cigarettes, uncomplicated: Secondary | ICD-10-CM | POA: Diagnosis not present

## 2024-04-27 DIAGNOSIS — J9601 Acute respiratory failure with hypoxia: Secondary | ICD-10-CM | POA: Diagnosis present

## 2024-04-27 DIAGNOSIS — M7121 Synovial cyst of popliteal space [Baker], right knee: Secondary | ICD-10-CM | POA: Diagnosis present

## 2024-04-27 DIAGNOSIS — D539 Nutritional anemia, unspecified: Secondary | ICD-10-CM | POA: Diagnosis not present

## 2024-04-27 DIAGNOSIS — Z881 Allergy status to other antibiotic agents status: Secondary | ICD-10-CM

## 2024-04-27 DIAGNOSIS — Z833 Family history of diabetes mellitus: Secondary | ICD-10-CM

## 2024-04-27 DIAGNOSIS — Z79899 Other long term (current) drug therapy: Secondary | ICD-10-CM

## 2024-04-27 DIAGNOSIS — M7989 Other specified soft tissue disorders: Secondary | ICD-10-CM | POA: Diagnosis not present

## 2024-04-27 DIAGNOSIS — D509 Iron deficiency anemia, unspecified: Secondary | ICD-10-CM | POA: Diagnosis not present

## 2024-04-27 DIAGNOSIS — I2510 Atherosclerotic heart disease of native coronary artery without angina pectoris: Secondary | ICD-10-CM | POA: Diagnosis not present

## 2024-04-27 DIAGNOSIS — J441 Chronic obstructive pulmonary disease with (acute) exacerbation: Principal | ICD-10-CM | POA: Diagnosis present

## 2024-04-27 DIAGNOSIS — E876 Hypokalemia: Secondary | ICD-10-CM | POA: Diagnosis present

## 2024-04-27 DIAGNOSIS — I10 Essential (primary) hypertension: Secondary | ICD-10-CM | POA: Diagnosis not present

## 2024-04-27 DIAGNOSIS — R0603 Acute respiratory distress: Secondary | ICD-10-CM | POA: Diagnosis not present

## 2024-04-27 DIAGNOSIS — E1122 Type 2 diabetes mellitus with diabetic chronic kidney disease: Secondary | ICD-10-CM | POA: Diagnosis present

## 2024-04-27 DIAGNOSIS — D72829 Elevated white blood cell count, unspecified: Secondary | ICD-10-CM | POA: Diagnosis not present

## 2024-04-27 DIAGNOSIS — I272 Pulmonary hypertension, unspecified: Secondary | ICD-10-CM | POA: Diagnosis not present

## 2024-04-27 DIAGNOSIS — N179 Acute kidney failure, unspecified: Secondary | ICD-10-CM | POA: Diagnosis not present

## 2024-04-27 DIAGNOSIS — D649 Anemia, unspecified: Secondary | ICD-10-CM | POA: Diagnosis not present

## 2024-04-27 DIAGNOSIS — I70201 Unspecified atherosclerosis of native arteries of extremities, right leg: Secondary | ICD-10-CM | POA: Diagnosis not present

## 2024-04-27 DIAGNOSIS — Z7982 Long term (current) use of aspirin: Secondary | ICD-10-CM

## 2024-04-27 DIAGNOSIS — E1151 Type 2 diabetes mellitus with diabetic peripheral angiopathy without gangrene: Secondary | ICD-10-CM | POA: Diagnosis present

## 2024-04-27 DIAGNOSIS — R0600 Dyspnea, unspecified: Secondary | ICD-10-CM | POA: Diagnosis not present

## 2024-04-27 DIAGNOSIS — N185 Chronic kidney disease, stage 5: Secondary | ICD-10-CM | POA: Diagnosis not present

## 2024-04-27 DIAGNOSIS — M1711 Unilateral primary osteoarthritis, right knee: Secondary | ICD-10-CM | POA: Diagnosis present

## 2024-04-27 DIAGNOSIS — Z9049 Acquired absence of other specified parts of digestive tract: Secondary | ICD-10-CM

## 2024-04-27 DIAGNOSIS — Z7951 Long term (current) use of inhaled steroids: Secondary | ICD-10-CM

## 2024-04-27 DIAGNOSIS — R918 Other nonspecific abnormal finding of lung field: Secondary | ICD-10-CM | POA: Diagnosis not present

## 2024-04-27 DIAGNOSIS — I5033 Acute on chronic diastolic (congestive) heart failure: Secondary | ICD-10-CM | POA: Diagnosis not present

## 2024-04-27 DIAGNOSIS — E878 Other disorders of electrolyte and fluid balance, not elsewhere classified: Secondary | ICD-10-CM | POA: Diagnosis not present

## 2024-04-27 HISTORY — DX: Chronic kidney disease, stage 5: N18.5

## 2024-04-27 NOTE — ED Triage Notes (Signed)
 Shortness of breath for several months getting worse for the past 3 days she is a dialysis pt but has not started dialysis yet she has a fistula in her rt arm that has not been used  she is c/o chest heaviness with both feet and ankles swollen  and she has a rash on both her arms

## 2024-04-28 ENCOUNTER — Other Ambulatory Visit: Payer: Self-pay

## 2024-04-28 ENCOUNTER — Emergency Department (HOSPITAL_COMMUNITY)

## 2024-04-28 ENCOUNTER — Encounter (HOSPITAL_COMMUNITY): Payer: Self-pay | Admitting: *Deleted

## 2024-04-28 DIAGNOSIS — T380X5A Adverse effect of glucocorticoids and synthetic analogues, initial encounter: Secondary | ICD-10-CM | POA: Diagnosis not present

## 2024-04-28 DIAGNOSIS — I70201 Unspecified atherosclerosis of native arteries of extremities, right leg: Secondary | ICD-10-CM | POA: Diagnosis present

## 2024-04-28 DIAGNOSIS — K449 Diaphragmatic hernia without obstruction or gangrene: Secondary | ICD-10-CM | POA: Diagnosis not present

## 2024-04-28 DIAGNOSIS — I69351 Hemiplegia and hemiparesis following cerebral infarction affecting right dominant side: Secondary | ICD-10-CM | POA: Diagnosis not present

## 2024-04-28 DIAGNOSIS — D539 Nutritional anemia, unspecified: Secondary | ICD-10-CM | POA: Diagnosis present

## 2024-04-28 DIAGNOSIS — I2510 Atherosclerotic heart disease of native coronary artery without angina pectoris: Secondary | ICD-10-CM | POA: Diagnosis present

## 2024-04-28 DIAGNOSIS — Z794 Long term (current) use of insulin: Secondary | ICD-10-CM | POA: Diagnosis not present

## 2024-04-28 DIAGNOSIS — R0603 Acute respiratory distress: Secondary | ICD-10-CM | POA: Diagnosis not present

## 2024-04-28 DIAGNOSIS — E1151 Type 2 diabetes mellitus with diabetic peripheral angiopathy without gangrene: Secondary | ICD-10-CM | POA: Diagnosis present

## 2024-04-28 DIAGNOSIS — E785 Hyperlipidemia, unspecified: Secondary | ICD-10-CM | POA: Diagnosis present

## 2024-04-28 DIAGNOSIS — F17210 Nicotine dependence, cigarettes, uncomplicated: Secondary | ICD-10-CM | POA: Diagnosis present

## 2024-04-28 DIAGNOSIS — I272 Pulmonary hypertension, unspecified: Secondary | ICD-10-CM | POA: Diagnosis present

## 2024-04-28 DIAGNOSIS — K219 Gastro-esophageal reflux disease without esophagitis: Secondary | ICD-10-CM | POA: Diagnosis present

## 2024-04-28 DIAGNOSIS — J9601 Acute respiratory failure with hypoxia: Secondary | ICD-10-CM | POA: Diagnosis present

## 2024-04-28 DIAGNOSIS — I10 Essential (primary) hypertension: Secondary | ICD-10-CM | POA: Diagnosis not present

## 2024-04-28 DIAGNOSIS — N185 Chronic kidney disease, stage 5: Secondary | ICD-10-CM | POA: Diagnosis present

## 2024-04-28 DIAGNOSIS — E1165 Type 2 diabetes mellitus with hyperglycemia: Secondary | ICD-10-CM | POA: Diagnosis not present

## 2024-04-28 DIAGNOSIS — R0602 Shortness of breath: Secondary | ICD-10-CM | POA: Diagnosis present

## 2024-04-28 DIAGNOSIS — R7989 Other specified abnormal findings of blood chemistry: Secondary | ICD-10-CM | POA: Diagnosis not present

## 2024-04-28 DIAGNOSIS — M7121 Synovial cyst of popliteal space [Baker], right knee: Secondary | ICD-10-CM | POA: Diagnosis present

## 2024-04-28 DIAGNOSIS — D509 Iron deficiency anemia, unspecified: Secondary | ICD-10-CM | POA: Diagnosis present

## 2024-04-28 DIAGNOSIS — M7989 Other specified soft tissue disorders: Secondary | ICD-10-CM | POA: Diagnosis not present

## 2024-04-28 DIAGNOSIS — H409 Unspecified glaucoma: Secondary | ICD-10-CM | POA: Diagnosis present

## 2024-04-28 DIAGNOSIS — D72829 Elevated white blood cell count, unspecified: Secondary | ICD-10-CM | POA: Diagnosis not present

## 2024-04-28 DIAGNOSIS — N184 Chronic kidney disease, stage 4 (severe): Secondary | ICD-10-CM | POA: Diagnosis not present

## 2024-04-28 DIAGNOSIS — J439 Emphysema, unspecified: Secondary | ICD-10-CM | POA: Diagnosis present

## 2024-04-28 DIAGNOSIS — E876 Hypokalemia: Secondary | ICD-10-CM | POA: Diagnosis present

## 2024-04-28 DIAGNOSIS — I132 Hypertensive heart and chronic kidney disease with heart failure and with stage 5 chronic kidney disease, or end stage renal disease: Secondary | ICD-10-CM | POA: Diagnosis present

## 2024-04-28 DIAGNOSIS — I5032 Chronic diastolic (congestive) heart failure: Secondary | ICD-10-CM | POA: Diagnosis present

## 2024-04-28 DIAGNOSIS — E878 Other disorders of electrolyte and fluid balance, not elsewhere classified: Secondary | ICD-10-CM | POA: Diagnosis not present

## 2024-04-28 DIAGNOSIS — E1122 Type 2 diabetes mellitus with diabetic chronic kidney disease: Secondary | ICD-10-CM | POA: Diagnosis present

## 2024-04-28 DIAGNOSIS — J441 Chronic obstructive pulmonary disease with (acute) exacerbation: Secondary | ICD-10-CM | POA: Diagnosis present

## 2024-04-28 LAB — BASIC METABOLIC PANEL WITH GFR
Anion gap: 11 (ref 5–15)
BUN: 47 mg/dL — ABNORMAL HIGH (ref 8–23)
CO2: 27 mmol/L (ref 22–32)
Calcium: 10.5 mg/dL — ABNORMAL HIGH (ref 8.9–10.3)
Chloride: 100 mmol/L (ref 98–111)
Creatinine, Ser: 5.81 mg/dL — ABNORMAL HIGH (ref 0.44–1.00)
GFR, Estimated: 7 mL/min — ABNORMAL LOW (ref >60)
Glucose, Bld: 97 mg/dL (ref 70–99)
Potassium: 3.4 mmol/L — ABNORMAL LOW (ref 3.5–5.1)
Sodium: 138 mmol/L (ref 135–145)

## 2024-04-28 LAB — CBC
HCT: 32.8 % — ABNORMAL LOW (ref 36.0–46.0)
Hemoglobin: 10.4 g/dL — ABNORMAL LOW (ref 12.0–15.0)
MCH: 32.2 pg (ref 26.0–34.0)
MCHC: 31.7 g/dL (ref 30.0–36.0)
MCV: 101.5 fL — ABNORMAL HIGH (ref 80.0–100.0)
Platelets: 254 K/uL (ref 150–400)
RBC: 3.23 MIL/uL — ABNORMAL LOW (ref 3.87–5.11)
RDW: 12.9 % (ref 11.5–15.5)
WBC: 10.3 K/uL (ref 4.0–10.5)
nRBC: 0.9 % — ABNORMAL HIGH (ref 0.0–0.2)

## 2024-04-28 LAB — GLUCOSE, CAPILLARY
Glucose-Capillary: 511 mg/dL (ref 70–99)
Glucose-Capillary: 487 mg/dL — ABNORMAL HIGH (ref 70–99)

## 2024-04-28 LAB — D-DIMER, QUANTITATIVE: D-Dimer, Quant: 0.77 ug{FEU}/mL — ABNORMAL HIGH (ref 0.00–0.50)

## 2024-04-28 LAB — TROPONIN I (HIGH SENSITIVITY)
Troponin I (High Sensitivity): 46 ng/L — ABNORMAL HIGH (ref <18)
Troponin I (High Sensitivity): 56 ng/L — ABNORMAL HIGH (ref <18)

## 2024-04-28 LAB — BRAIN NATRIURETIC PEPTIDE: B Natriuretic Peptide: 757.7 pg/mL — ABNORMAL HIGH (ref 0.0–100.0)

## 2024-04-28 MED ORDER — ISOSORBIDE MONONITRATE ER 60 MG PO TB24
60.0000 mg | ORAL_TABLET | Freq: Every day | ORAL | Status: DC
  Administered 2024-04-28 – 2024-05-02 (×8): 60 mg via ORAL
  Filled 2024-04-28 (×5): qty 1

## 2024-04-28 MED ORDER — ACETAMINOPHEN 325 MG PO TABS: 650.0000 mg | ORAL_TABLET | Freq: Four times a day (QID) | ORAL | Status: DC | PRN

## 2024-04-28 MED ORDER — INSULIN ASPART 100 UNIT/ML IJ SOLN: 0.0000 [IU] | Freq: Every day | INTRAMUSCULAR | Status: DC

## 2024-04-28 MED ORDER — INSULIN ASPART 100 UNIT/ML IJ SOLN
15.0000 [IU] | Freq: Once | INTRAMUSCULAR | Status: AC
  Administered 2024-04-28: 15 [IU] via SUBCUTANEOUS

## 2024-04-28 MED ORDER — HYDRALAZINE HCL 20 MG/ML IJ SOLN
10.0000 mg | INTRAMUSCULAR | Status: DC | PRN
  Administered 2024-04-28: 10 mg via INTRAVENOUS
  Filled 2024-04-28: qty 1

## 2024-04-28 MED ORDER — BUSPIRONE HCL 10 MG PO TABS: 5.0000 mg | ORAL_TABLET | Freq: Two times a day (BID) | ORAL | Status: DC

## 2024-04-28 MED ORDER — POTASSIUM CHLORIDE CRYS ER 20 MEQ PO TBCR
40.0000 meq | EXTENDED_RELEASE_TABLET | Freq: Once | ORAL | Status: AC
  Administered 2024-04-28: 40 meq via ORAL
  Filled 2024-04-28: qty 2

## 2024-04-28 MED ORDER — ISOSORBIDE MONONITRATE ER 30 MG PO TB24: 60.0000 mg | ORAL_TABLET | Freq: Every day | ORAL | Status: DC

## 2024-04-28 MED ORDER — IPRATROPIUM-ALBUTEROL 0.5-2.5 (3) MG/3ML IN SOLN
3.0000 mL | Freq: Four times a day (QID) | RESPIRATORY_TRACT | Status: DC | PRN
  Administered 2024-04-28 – 2024-05-01 (×4): 3 mL via RESPIRATORY_TRACT
  Filled 2024-04-28 (×2): qty 3

## 2024-04-28 MED ORDER — INSULIN ASPART 100 UNIT/ML IJ SOLN
10.0000 [IU] | Freq: Three times a day (TID) | INTRAMUSCULAR | Status: DC
  Administered 2024-04-28: 10 [IU] via SUBCUTANEOUS

## 2024-04-28 MED ORDER — FUROSEMIDE 40 MG PO TABS
80.0000 mg | ORAL_TABLET | Freq: Two times a day (BID) | ORAL | Status: DC
  Administered 2024-04-28 – 2024-04-29 (×3): 80 mg via ORAL
  Filled 2024-04-28 (×3): qty 2

## 2024-04-28 MED ORDER — IPRATROPIUM-ALBUTEROL 0.5-2.5 (3) MG/3ML IN SOLN: 3.0000 mL | RESPIRATORY_TRACT | Status: DC | PRN

## 2024-04-28 MED ORDER — NITROGLYCERIN 0.4 MG SL SUBL
0.4000 mg | SUBLINGUAL_TABLET | Freq: Once | SUBLINGUAL | Status: AC
  Administered 2024-04-28: 0.4 mg via SUBLINGUAL
  Filled 2024-04-28: qty 1

## 2024-04-28 MED ORDER — ACETAMINOPHEN 500 MG PO TABS
1000.0000 mg | ORAL_TABLET | Freq: Two times a day (BID) | ORAL | Status: DC
  Administered 2024-04-28: 1000 mg via ORAL
  Filled 2024-04-28: qty 2

## 2024-04-28 MED ORDER — METHYLPREDNISOLONE SODIUM SUCC 125 MG IJ SOLR
80.0000 mg | Freq: Two times a day (BID) | INTRAMUSCULAR | Status: DC
  Administered 2024-04-28: 80 mg via INTRAVENOUS
  Filled 2024-04-28: qty 2

## 2024-04-28 MED ORDER — INSULIN GLARGINE-YFGN 100 UNIT/ML ~~LOC~~ SOLN
20.0000 [IU] | Freq: Once | SUBCUTANEOUS | Status: AC
  Administered 2024-04-28: 20 [IU] via SUBCUTANEOUS
  Filled 2024-04-28: qty 0.2

## 2024-04-28 MED ORDER — ALBUTEROL SULFATE (2.5 MG/3ML) 0.083% IN NEBU: 2.5000 mg | INHALATION_SOLUTION | RESPIRATORY_TRACT | Status: DC | PRN

## 2024-04-28 MED ORDER — CLONIDINE HCL 0.1 MG PO TABS
0.1000 mg | ORAL_TABLET | Freq: Every day | ORAL | Status: DC
Start: 2024-04-28 — End: 2024-04-28
  Filled 2024-04-28: qty 1

## 2024-04-28 MED ORDER — CLONIDINE HCL 0.1 MG PO TABS: 0.1000 mg | ORAL_TABLET | Freq: Every day | ORAL | Status: DC

## 2024-04-28 MED ORDER — INSULIN ASPART 100 UNIT/ML IJ SOLN
0.0000 [IU] | Freq: Three times a day (TID) | INTRAMUSCULAR | Status: DC
  Administered 2024-04-29: 20 [IU] via SUBCUTANEOUS
  Administered 2024-04-29 (×2): 11 [IU] via SUBCUTANEOUS

## 2024-04-28 MED ORDER — ROSUVASTATIN CALCIUM 5 MG PO TABS: 10.0000 mg | ORAL_TABLET | Freq: Every day | ORAL | Status: DC

## 2024-04-28 MED ORDER — ASPIRIN 81 MG PO TBEC: 81.0000 mg | DELAYED_RELEASE_TABLET | Freq: Every day | ORAL | Status: DC

## 2024-04-28 MED ORDER — DOXYCYCLINE HYCLATE 100 MG PO TABS
100.0000 mg | ORAL_TABLET | Freq: Two times a day (BID) | ORAL | Status: DC
  Administered 2024-04-28 – 2024-05-02 (×13): 100 mg via ORAL
  Filled 2024-04-28 (×8): qty 1

## 2024-04-28 MED ORDER — CALCITRIOL 0.5 MCG PO CAPS: 0.5000 ug | ORAL_CAPSULE | Freq: Every morning | ORAL | Status: DC

## 2024-04-28 MED ORDER — PREDNISONE 20 MG PO TABS
40.0000 mg | ORAL_TABLET | Freq: Every day | ORAL | Status: AC
  Administered 2024-04-29 – 2024-05-01 (×5): 40 mg via ORAL
  Filled 2024-04-28 (×3): qty 2

## 2024-04-28 MED ORDER — IPRATROPIUM-ALBUTEROL 0.5-2.5 (3) MG/3ML IN SOLN
3.0000 mL | Freq: Four times a day (QID) | RESPIRATORY_TRACT | Status: DC
  Administered 2024-04-28: 3 mL via RESPIRATORY_TRACT
  Filled 2024-04-28: qty 3

## 2024-04-28 MED ORDER — INSULIN ASPART 100 UNIT/ML FLEXPEN: 10.0000 [IU] | PEN_INJECTOR | Freq: Three times a day (TID) | SUBCUTANEOUS | Status: DC

## 2024-04-28 MED ORDER — IPRATROPIUM-ALBUTEROL 0.5-2.5 (3) MG/3ML IN SOLN
3.0000 mL | Freq: Four times a day (QID) | RESPIRATORY_TRACT | Status: DC
  Administered 2024-04-28 – 2024-04-29 (×2): 3 mL via RESPIRATORY_TRACT
  Filled 2024-04-28 (×2): qty 3

## 2024-04-28 MED ORDER — METHYLPREDNISOLONE SODIUM SUCC 125 MG IJ SOLR
125.0000 mg | Freq: Once | INTRAMUSCULAR | Status: AC
  Administered 2024-04-28: 125 mg via INTRAVENOUS
  Filled 2024-04-28: qty 2

## 2024-04-28 MED ORDER — CLONIDINE HCL 0.1 MG PO TABS
0.2000 mg | ORAL_TABLET | Freq: Every day | ORAL | Status: DC
  Administered 2024-04-28 – 2024-05-01 (×6): 0.2 mg via ORAL
  Filled 2024-04-28 (×4): qty 2

## 2024-04-28 MED ORDER — ROPINIROLE HCL 0.25 MG PO TABS
0.2500 mg | ORAL_TABLET | Freq: Every day | ORAL | Status: DC
  Administered 2024-04-28 – 2024-05-01 (×6): 0.25 mg via ORAL
  Filled 2024-04-28 (×5): qty 1

## 2024-04-28 MED ORDER — ENOXAPARIN SODIUM 30 MG/0.3ML IJ SOSY
30.0000 mg | PREFILLED_SYRINGE | INTRAMUSCULAR | Status: DC
  Administered 2024-04-28: 30 mg via SUBCUTANEOUS
  Filled 2024-04-28: qty 0.3

## 2024-04-28 MED ORDER — INSULIN GLARGINE-YFGN 100 UNIT/ML ~~LOC~~ SOLN: 20.0000 [IU] | Freq: Every day | SUBCUTANEOUS | Status: DC

## 2024-04-28 MED ORDER — ROSUVASTATIN CALCIUM 5 MG PO TABS
10.0000 mg | ORAL_TABLET | Freq: Every day | ORAL | Status: DC
  Administered 2024-04-28 – 2024-05-02 (×8): 10 mg via ORAL
  Filled 2024-04-28 (×5): qty 2

## 2024-04-28 MED ORDER — INSULIN ASPART 100 UNIT/ML FLEXPEN
10.0000 [IU] | PEN_INJECTOR | Freq: Three times a day (TID) | SUBCUTANEOUS | Status: DC
  Filled 2024-04-28: qty 3

## 2024-04-28 MED ORDER — ACETAMINOPHEN 650 MG RE SUPP: 650.0000 mg | Freq: Four times a day (QID) | RECTAL | Status: DC | PRN

## 2024-04-28 MED ORDER — METOPROLOL TARTRATE 25 MG PO TABS: 25.0000 mg | ORAL_TABLET | Freq: Two times a day (BID) | ORAL | Status: DC

## 2024-04-28 MED ORDER — ACETAMINOPHEN 500 MG PO TABS: 1000.0000 mg | ORAL_TABLET | Freq: Two times a day (BID) | ORAL | Status: DC

## 2024-04-28 MED ORDER — AMLODIPINE BESYLATE 10 MG PO TABS
10.0000 mg | ORAL_TABLET | Freq: Every day | ORAL | Status: DC
  Administered 2024-04-28 – 2024-05-02 (×8): 10 mg via ORAL
  Filled 2024-04-28 (×6): qty 1

## 2024-04-28 MED ORDER — PREDNISONE 20 MG PO TABS: 40.0000 mg | ORAL_TABLET | Freq: Every day | ORAL | Status: DC

## 2024-04-28 MED ORDER — METOPROLOL TARTRATE 25 MG PO TABS
25.0000 mg | ORAL_TABLET | Freq: Two times a day (BID) | ORAL | Status: DC
  Administered 2024-04-28 – 2024-05-01 (×10): 25 mg via ORAL
  Filled 2024-04-28 (×9): qty 1

## 2024-04-28 MED ORDER — HYDRALAZINE HCL 50 MG PO TABS
100.0000 mg | ORAL_TABLET | Freq: Three times a day (TID) | ORAL | Status: DC
  Administered 2024-04-28 – 2024-05-02 (×19): 100 mg via ORAL
  Filled 2024-04-28 (×12): qty 2

## 2024-04-28 MED ORDER — FUROSEMIDE 20 MG PO TABS: 80.0000 mg | ORAL_TABLET | Freq: Two times a day (BID) | ORAL | Status: DC

## 2024-04-28 MED ORDER — AMLODIPINE BESYLATE 5 MG PO TABS: 10.0000 mg | ORAL_TABLET | Freq: Every day | ORAL | Status: DC

## 2024-04-28 MED ORDER — ROPINIROLE HCL 0.25 MG PO TABS: 0.2500 mg | ORAL_TABLET | Freq: Every day | ORAL | Status: DC

## 2024-04-28 MED ORDER — BUSPIRONE HCL 5 MG PO TABS
5.0000 mg | ORAL_TABLET | Freq: Two times a day (BID) | ORAL | Status: DC
  Administered 2024-04-28 – 2024-05-02 (×14): 5 mg via ORAL
  Filled 2024-04-28 (×9): qty 1

## 2024-04-28 MED ORDER — CALCITRIOL 0.25 MCG PO CAPS
0.5000 ug | ORAL_CAPSULE | Freq: Every morning | ORAL | Status: DC
  Administered 2024-04-29 – 2024-05-02 (×7): 0.5 ug via ORAL
  Filled 2024-04-28 (×5): qty 2

## 2024-04-28 MED ORDER — ASPIRIN 81 MG PO TBEC
81.0000 mg | DELAYED_RELEASE_TABLET | Freq: Every day | ORAL | Status: DC
  Administered 2024-04-28 – 2024-05-02 (×8): 81 mg via ORAL
  Filled 2024-04-28 (×5): qty 1

## 2024-04-28 MED ORDER — IPRATROPIUM-ALBUTEROL 0.5-2.5 (3) MG/3ML IN SOLN
3.0000 mL | Freq: Once | RESPIRATORY_TRACT | Status: AC
  Administered 2024-04-28: 3 mL via RESPIRATORY_TRACT
  Filled 2024-04-28: qty 3

## 2024-04-28 MED ORDER — HYDRALAZINE HCL 50 MG PO TABS: 100.0000 mg | ORAL_TABLET | Freq: Three times a day (TID) | ORAL | Status: DC

## 2024-04-28 NOTE — ED Notes (Signed)
 Transport at bedside

## 2024-04-28 NOTE — ED Notes (Signed)
 CCMD notified via telephone.

## 2024-04-28 NOTE — ED Notes (Signed)
 Pt being transported to CT

## 2024-04-28 NOTE — ED Notes (Addendum)
 IP unit notified patient will be en-route in 10-15 minutes.

## 2024-04-28 NOTE — Progress Notes (Signed)
 Patient arrived at the unit from ED,vitals taken,CHG bath given,CCMD notified,pt oriented to the unit,call bell in reach

## 2024-04-28 NOTE — H&P (Addendum)
 History and Physical    Patient: [name] FMW:[id number] DOB: 1945/10/01 DOA: 04/27/2024 DOS: the patient was seen and examined on 04/28/2024 PCP: Lenon Nell SAILOR, FNP  Patient coming from: Home  Chief Complaint:  Chief Complaint  Patient presents with   Shortness of Breath   HPI: [name] is a 78 y.o. female with medical history significant of coronary artery disease, CVA, carotid stenosis, history of brain aneurysm, history of CVA, diabetes non-insulin -dependent type II, chronic kidney disease stage V, COPD, pulmonary nodules, tobacco abuse, anxiety disorder, aortic atherosclerosis, migraine headaches, essential hypertension, hyperlipidemia and peripheral vascular disease who presents with acute on chronic shortness of breath.  Patient reports that for the past year she has been having increasing shortness of breath but that this acutely worsened 3 days ago.  No sick contacts.  No URI symptoms.  Has history of COPD for which she sees pulmonology and is on Trelegy.  Denies having any home rescue inhalers and reports that the first time she ever received albuterol  was in the emergency department earlier this morning.  She has noted persistently swollen lower extremities bilaterally.  Present for the past year or so since the decline in her renal function.  However the past 3 days she has noticed worsening bilateral lower extremity edema left greater than right.  Both calves are painful.  Denies any redness.  No chest pain throughout all this.  Just trouble catching her breath.  No nausea or vomiting.  She has been eating and drinking well but due to the persistent shortness of breath she fell and came to the emergency department.  ER course: In the ER she had two-view chest x-ray which showed no active cardiopulmonary disease as well as CT chest without contrast that showed scattered nodules throughout lungs and stable adrenal thickening but no other cardiopulmonary disease.  Labs in ER  showed potassium 3.4, creatinine 5.81 down from 8 last admission, hemoglobin 10.4.  Found to be newly hypoxic and started on oxygen 2 L via nasal cannula.  TRH called for admission for acute hypoxemic respiratory failure.  Review of Systems: As mentioned in the history of present illness. All other systems reviewed and are negative. 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,  small   Anxiety    Arthritis    Cataract    bilateral - surgery   CKD (chronic kidney disease), stage IV (HCC)    followed by [facility]   Colon polyps    Depression    Diabetes mellitus 1998   type 2   Diverticulosis    Emphysema of lung (HCC)    no one has evry told me that   ESOPHAGEAL STRICTURE 08/27/2008   Family history of adverse reaction to anesthesia    daughter n/v   GERD (gastroesophageal reflux disease)    Glaucoma    bilateral   Heart failure (HCC) 01/04/2024   Hemorrhoids    hx   Hiatal hernia    Hyperlipidemia    Hypertension 2000   Migraines    Neuropathy    fingers and toes   Peripheral vascular disease (HCC)    Phlebitis    30 years ago  left leg   Stroke [facility])    multiple mini strokes ( brain aneurysm ).  Right side weaker. 2 strokes   Past Surgical History:  Procedure Laterality Date  ABDOMINAL AORTAGRAM N/A 01/04/2012   Procedure: ABDOMINAL AORTAGRAM;  Surgeon: Gaile LELON New, MD;  Location: [facility] CATH LAB;  Service: Cardiovascular;  Laterality: N/A;   ABDOMINAL AORTAGRAM N/A 08/15/2012   Procedure: ABDOMINAL EZELLA;  Surgeon: Gaile LELON New, MD;  Location: [facility] CATH LAB;  Service: Cardiovascular;  Laterality: N/A;   ABDOMINAL AORTOGRAM W/LOWER EXTREMITY Left 11/17/2021   Procedure: ABDOMINAL AORTOGRAM W/LOWER EXTREMITY;  Surgeon: New Gaile LELON, MD;  Location: [facility] INVASIVE CV LAB;  Service: Cardiovascular;  Laterality: Left;   ABDOMINAL AORTOGRAM W/LOWER  EXTREMITY N/A 03/09/2022   Procedure: ABDOMINAL AORTOGRAM W/LOWER EXTREMITY;  Surgeon: New Gaile LELON, MD;  Location: [facility] INVASIVE CV LAB;  Service: Cardiovascular;  Laterality: N/A;   ABDOMINAL AORTOGRAM W/LOWER EXTREMITY N/A 03/15/2023   Procedure: ABDOMINAL AORTOGRAM W/LOWER EXTREMITY;  Surgeon: New Gaile LELON, MD;  Location: [facility] INVASIVE CV LAB;  Service: Cardiovascular;  Laterality: N/A;   ABDOMINAL HYSTERECTOMY  1984   AV FISTULA PLACEMENT Right 12/21/2023   Procedure: RIGHT ARM BRACHIOCEPHALIC ARTERIOVENOUS (AV) FISTULA CREATION;  Surgeon: New Gaile LELON, MD;  Location: [facility] OR;  Service: Vascular;  Laterality: Right;   BREAST SURGERY     cataract surgery Right 10/22/2015   cataract surgery Left 11/05/2015   CESAREAN SECTION     CHOLECYSTECTOMY     COLONOSCOPY  07/2010   DENTAL SURGERY  05/05/2012   left lower    ESOPHAGOGASTRODUODENOSCOPY  07/2010   EYE SURGERY  07/2013   Laser-Glaucoma   FEMORAL ARTERY STENT  05/11/2011   Left superficial femoral and popliteal artery   FEMORAL-POPLITEAL BYPASS GRAFT Left 11/18/2021   Procedure: LEFT FEMORAL-POPLITEAL ARTERY BYPASS GRAFT;  Surgeon: New Gaile LELON, MD;  Location: [facility] OR;  Service: Vascular;  Laterality: Left;   FISTULA SUPERFICIALIZATION Right 02/01/2024   Procedure: RIGHT AV FISTULA SUPERFICIALIZATION;  Surgeon: New Gaile LELON, MD;  Location: [facility] OR;  Service: Vascular;  Laterality: Right;   HERNIA REPAIR     times two   KNEE SURGERY     LOWER EXTREMITY ANGIOGRAM Left 08/15/2012   Procedure: LOWER EXTREMITY ANGIOGRAM;  Surgeon: Gaile LELON New, MD;  Location: [facility] CATH LAB;  Service: Cardiovascular;  Laterality: Left;  lt leg angio   PERIPHERAL VASCULAR BALLOON ANGIOPLASTY Left 03/09/2022   Procedure: PERIPHERAL VASCULAR BALLOON ANGIOPLASTY;  Surgeon: New Gaile LELON, MD;  Location: [facility] INVASIVE CV LAB;  Service: Cardiovascular;  Laterality: Left;   PERIPHERAL VASCULAR BALLOON ANGIOPLASTY Left 03/15/2023   Procedure: PERIPHERAL  VASCULAR BALLOON ANGIOPLASTY;  Surgeon: New Gaile LELON, MD;  Location: [facility] INVASIVE CV LAB;  Service: Cardiovascular;  Laterality: Left;   rotator cuff surgery     THYROID  SURGERY     Social History:  reports that she has been smoking cigarettes. She started smoking about 4 months ago. She has a 20.1 pack-year smoking history. She has never been exposed to tobacco smoke. She has never used smokeless tobacco. She reports that she does not drink alcohol  and does not use drugs.  Allergies  Allergen Reactions   Ciprofloxacin  Swelling   Ozempic  (0.25 Or 0.5 Mg-Dose) [Semaglutide (0.25 Or 0.5mg -Dos)] Nausea And Vomiting   Plavix [Clopidogrel Bisulfate] Palpitations   Amoxicillin Itching and Swelling    FACE & EYES SWELL   Ace Inhibitors Cough    Sore throat   Atorvastatin      myalgias   Lyrica [Pregabalin] Itching and Nausea Only     gain weight   Penicillins Other (See Comments)    Pt unsure if there is an allergic reaction  Family History  Problem Relation Age of Onset   CAD Mother 34       Died of MI   Hypertension Mother    Heart attack Mother    Heart disease Mother    CAD Father 11       Died of MI   Heart disease Father    Heart attack Father    CAD Brother 94       Two brothers died of MI   Heart attack Brother    Heart disease Brother        Amputation   Diabetes Sister    Hypertension Sister    Heart attack Brother    Heart disease Brother    Stroke Sister    Colon cancer Neg Hx    Stomach cancer Neg Hx    Esophageal cancer Neg Hx     Prior to Admission medications   Medication Sig Start Date End Date Taking? Authorizing Provider  acetaminophen  (TYLENOL ) 500 MG tablet Take 1,000 mg by mouth 2 (two) times daily.   Yes [provider]  amLODipine  (NORVASC ) 10 MG tablet Take 1 tablet (10 mg total) by mouth daily. 01/10/24  Yes Arlon Carliss ORN, DO  aspirin  EC 81 MG tablet Take 1 tablet (81 mg total) by mouth daily. Swallow whole. 01/11/24  Yes Arlon Carliss ORN, DO  busPIRone  (BUSPAR ) 5 MG tablet Take 1 tablet (5 mg total) by mouth 2 (two) times daily. Patient taking differently: Take 5 mg by mouth at bedtime. 02/01/24  Yes Rhyne, [name] J, PA-C  calcitRIOL  (ROCALTROL ) 0.5 MCG capsule Take 0.5 mcg by mouth in the morning. 11/30/23  Yes [provider]  cloNIDine  (CATAPRES ) 0.1 MG tablet Take 1 tablet (0.1 mg total) by mouth at bedtime. 01/10/24  Yes Arlon Carliss ORN, DO  Fluticasone -Umeclidin-Vilant (TRELEGY ELLIPTA ) 200-62.5-25 MCG/ACT AEPB Inhale 1 puff into the lungs daily. 07/20/23  Yes Mannam, Praveen, MD  furosemide  (LASIX ) 40 MG tablet Take 80 mg by mouth 2 (two) times daily.   Yes [provider]  hydrALAZINE  (APRESOLINE ) 100 MG tablet Take 1 tablet (100 mg total) by mouth every 8 (eight) hours. 02/16/24  Yes Monge, Damien BROCKS, NP  insulin  aspart (NOVOLOG  FLEXPEN) 100 UNIT/ML FlexPen Inject 10-14 Units into the skin 3 (three) times daily before meals. 03/05/24  Yes Trixie File, MD  insulin  degludec (TRESIBA  FLEXTOUCH) 200 UNIT/ML FlexTouch Pen Inject 40 Units into the skin daily. 01/10/24  Yes Arlon Carliss ORN, DO  isosorbide  mononitrate (IMDUR ) 60 MG 24 hr tablet Take 1 tablet (60 mg total) by mouth daily. 01/11/24  Yes Arlon Carliss ORN, DO  metoprolol  tartrate (LOPRESSOR ) 25 MG tablet Take 1 tablet (25 mg total) by mouth 2 (two) times daily. 02/07/24 05/07/24 Yes Monge, Damien BROCKS, NP  Omega-3 Fatty Acids (FISH OIL) 1000 MG CAPS Take 1,000 mg by mouth in the morning and at bedtime. 11/21/21  Yes Bethanie Cough, PA-C  Potassium Chloride  ER 20 MEQ TBCR Take 2 tablets (40 mEq total) by mouth every other day. 03/22/24  Yes Raenelle Coria, MD  rOPINIRole  (REQUIP ) 0.25 MG tablet Take 1 tablet (0.25 mg total) by mouth at bedtime. Take 0.25 mg by mouth at bedtime. 02/27/24  Yes Paseda, Folashade R, FNP  rosuvastatin  (CRESTOR ) 10 MG tablet Take 1 tablet (10 mg total) by mouth daily. 03/20/24 06/18/24 Yes West, Katlyn D, NP    Physical  Exam: Vitals:   04/28/24 1200 04/28/24 1258 04/28/24 1303 04/28/24 1339  BP: ROLLEN)  195/60 (!) 200/55 (!) 196/54 (!) 196/54  Pulse: 80 82 79 79  Resp: (!) 25 20 17    Temp:  98 F (36.7 C)    TempSrc:  Oral    SpO2: 100% 100% 100%   Weight:      Height:       Gen: Patient seated on side of bed.  Can speak in full sentences but does get winded if they are longer sentences.  Some pursed lip breathing. HEENT: PERL, EOMI.  Dry mucous membranes Neck: No JVD noted Heart: Regular rate and rhythm Lungs: Some scattered wheezing throughout especially posterior lung fields Abdomen: Soft/nondistended/nontender. Extremities: +1 pretibial edema right lower extremity and +2 pretibial edema left lower extremity.  No overlying redness or warmth Neuro: Alert and orient x 4.  Moving all limbs symmetrically Psych: Calm, linear coherent thought process with good judgment and insight  Data Reviewed:    Assessment and Plan: Acute hypoxic respiratory failure: -Working diagnosis at this point appears to be COPD exacerbation.  PE also possibility.  Less likely cardiac induced issues as troponin has been about the same at 56 which is actually lower than ER visit in July 66.  Chronically elevated secondary to renal disease.  Also no crackles on exam/lungs appear clear on chest imaging, so fluid overload doesn't seem to be the culprit. -Plan will be to continue oxygen and continue DuoNebs as these were helpful earlier.  She reports this is the first time she is ever used DuoNebs.. - Does have some lower extremity edema on examination bilaterally, but left greater than right, although not red or hot which would be more consistent with DVT.  Cannot get CTA due to contrast because of her GFR being only 7. -Ordered STAT Doppler bilateral lower extremities, also ordered VQ scan to rule out PE.  D-dimer slightly elevated, normal based on her age - on DVT PPX currently, if LE dopplers or VQ scan abnormal will start tx  dose AC.   COPD exacerbation: - Status post DuoNebs and Solu-Medrol  x 1 in the ER. - Will continue DuoNebs.  Will switch to prednisone  40 mg daily for 5-day course total steroids. - Starting doxycycline  for COPD exacerbation - Unclear trigger. - Doppler/VQ scan pending as above.  CKD stage V: - Will consult renal for further recommendations while inpatient. - She still makes urine.  She is on Lasix  80 mg p.o. twice daily outpatient, and will continue  Elevated troponin: - Chronic elevation - No chest pain. - EKG showed sinus bradycardia otherwise same as prior EKG.  No evidence of ischemia.  Diabetes mellitus type 2: - Starting Semglee  20 units.  She takes 40 units at home. - Also starting sliding scale correction - A1c pending, will trend CBGs in house.  Hypokalemia: - Replaced in house.  Hypercalcemia: - Possibly spurious. - Will follow electrolytes and albumin .  DVT prophylaxis: - Renally adjusted enoxaparin    Advance Care Planning:   Code Status: Full Code   Family Communication: No family at bedside  Severity of Illness: Inpatient secondary to COPD exacerbation/acute hypoxic respiratory failure.  Author: Reyes VEAR Gaw, MD 04/28/2024 2:52 PM  For on call review [email or web address].

## 2024-04-28 NOTE — ED Provider Triage Note (Signed)
 Emergency Medicine Provider Triage Evaluation Note  [name] , a 78 y.o. female  was evaluated in triage.  Pt complains of SOB.  States that she has been worsening for months, but significantly worse the past few days.  States that she is going to start dialysis soon, but hasn't started yet.  Had been on ?Metazalone?, but this was discontinued.  Had a recent iron infusion.  Review of Systems  Positive: SOB Negative:   Physical Exam  BP (!) 170/41 (BP Location: Left Arm)   Pulse (!) 58   Temp 97.8 F (36.6 C)   Resp (!) 30   Ht 5' 6 (1.676 m)   Wt 81.2 kg   SpO2 97%   BMI 28.89 kg/m  Gen:   Awake, no distress   Resp:  Normal effort  MSK:   Moves extremities without difficulty  Other:    Medical Decision Making  Medically screening exam initiated at 12:25 AM.  Appropriate orders placed.  [name] was informed that the remainder of the evaluation will be completed by another provider, this initial triage assessment does not replace that evaluation, and the importance of remaining in the ED until their evaluation is complete.     Vicky Charleston, PA-C 04/28/24 [phone]

## 2024-04-28 NOTE — ED Notes (Signed)
 CCMD notified. Pt is on monitor.

## 2024-04-28 NOTE — ED Provider Notes (Signed)
 [facility]PT [facility] Emergency Department Provider Note MRN:  [id number]  Arrival date & time: 04/28/24     Chief Complaint   Shortness of Breath   History of Present Illness   [name] is a 78 y.o. year-old female with a history of CKD, COPD presenting to the ED with chief complaint of shortness of breath.  Worsening shortness of breath over the past several days.  Increased lower extremity edema.  Feeling very weak and tired.  Feeling cold.  Not on dialysis yet but has a fistula.  Denies fever or cough, no chest pain, no abdominal pain.  Review of Systems  A thorough review of systems was obtained and all systems are negative except as noted in the HPI and PMH.   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,  small   Anxiety    Arthritis    Cataract    bilateral - surgery   CKD (chronic kidney disease), stage IV (HCC)    followed by [facility]   Colon polyps    Depression    Diabetes mellitus 1998   type 2   Diverticulosis    Emphysema of lung (HCC)    no one has evry told me that   ESOPHAGEAL STRICTURE 08/27/2008   Family history of adverse reaction to anesthesia    daughter n/v   GERD (gastroesophageal reflux disease)    Glaucoma    bilateral   Heart failure (HCC) 01/04/2024   Hemorrhoids    hx   Hiatal hernia    Hyperlipidemia    Hypertension 2000   Migraines    Neuropathy    fingers and toes   Peripheral vascular disease (HCC)    Phlebitis    30 years ago  left leg   Stroke [facility])    multiple mini strokes ( brain aneurysm ).  Right side weaker. 2 strokes    Past Surgical History:  Procedure Laterality Date   ABDOMINAL AORTAGRAM N/A 01/04/2012   Procedure: ABDOMINAL AORTAGRAM;  Surgeon: Gaile LELON New, MD;  Location: [facility] CATH LAB;  Service: Cardiovascular;  Laterality: N/A;   ABDOMINAL  AORTAGRAM N/A 08/15/2012   Procedure: ABDOMINAL EZELLA;  Surgeon: Gaile LELON New, MD;  Location: [facility] CATH LAB;  Service: Cardiovascular;  Laterality: N/A;   ABDOMINAL AORTOGRAM W/LOWER EXTREMITY Left 11/17/2021   Procedure: ABDOMINAL AORTOGRAM W/LOWER EXTREMITY;  Surgeon: New Gaile LELON, MD;  [facility] INVASIVE CV LAB;  Service: Cardiovascular;  Laterality: Left;   ABDOMINAL AORTOGRAM W/LOWER EXTREMITY N/A 03/09/2022   Procedure: ABDOMINAL AORTOGRAM W/LOWER EXTREMITY;  Surgeon: New Gaile LELON, MD;  [facility] INVAS[facility] CV LAB;  Service: Cardiovascular;  Laterality: N/A;   ABDOMINAL AORTOGRAM W/LOWER EXTREMITY N/A 03/15/2023   Procedure: ABDOMINAL AORTOGRAM W/LOWER EXTREMITY;  Surgeon: New Gaile LELON, MD;  [facility] CV LAB;  Service: Cardiovascular;  Laterality: N/A;   ABDOMINAL HYSTERECTOMY  1984   AV FISTULA PLACEMENT Right 12/21/2023   Procedure: RIGHT ARM BRACHIOCEPHALIC ARTERIOVENOUS (AV) FISTULA CREATION;  Surgeon: New Gaile LELON, MD;  Location[facility] OR;  Service: Vascular;  Laterality: Right;   BREAST SURGERY     cataract surgery Right 10/22/2015   cataract surgery Left 11/05/2015   CESAREAN SECTION     CHOLECYSTECTOMY     COLONOSCOPY  07/2010   DENTAL SURGERY  05/05/2012   left  lower    ESOPHAGOGASTRODUODENOSCOPY  07/2010   EYE SURGERY  07/2013   Laser-Glaucoma   FEMORAL ARTERY STENT  05/11/2011   Left superficial femoral and popliteal artery   FEMORAL-POPLITEAL BYPASS GRAFT Left 11/18/2021   Procedure: LEFT FEMORAL-POPLITEAL ARTERY BYPASS GRAFT;  Surgeon: Serene Gaile ORN, MD;  Location[facility];  Service: Vascular;  Laterality: Left;   FISTULA SUPERFICIALIZATION Right 02/01/2024   Procedure: RIGHT AV FISTULA SUPERFICIALIZATION;  Surgeon: Serene Gaile ORN, MD;  Location[facility];  Service: Vascular;  Laterality: Right;   HERNIA REPAIR     times two   KNEE SURGERY     LOWER EXTREMITY ANGIOGRAM Left 08/15/2012   Procedure: LOWER EXTREMITY ANGIOGRAM;  Surgeon:  Gaile ORN Serene, MD;  Location: [facility] CATH LAB;  Service: Cardiovascular;  Laterality: Left;  lt leg angio   PERIPHERAL VASCULAR BALLOON ANGIOPLASTY Left 03/09/2022   Procedure: PERIPHERAL VASCULAR BALLOON ANGIOPLASTY;  Surgeon: Serene Gaile ORN, MD;  [facility];  Service: Cardiovascular;  Laterality: Left;   PERIPHERAL VASCULAR BALLOON ANGIOPLASTY Left 03/15/2023   Procedure: PERIPHERAL VASCULAR BALLOON ANGIOPLASTY;  Surgeon: Serene Gaile ORN, MD;  [facility] CV LAB;  Service: Cardiovascular;  Laterality: Left;   rotator cuff surgery     THYROID  SURGERY      Family History  Problem Relation Age of Onset   CAD Mother 37       Died of MI   Hypertension Mother    Heart attack Mother    Heart disease Mother    CAD Father 22       Died of MI   Heart disease Father    Heart attack Father    CAD Brother 51       Two brothers died of MI   Heart attack Brother    Heart disease Brother        Amputation   Diabetes Sister    Hypertension Sister    Heart attack Brother    Heart disease Brother    Stroke Sister    Colon cancer Neg Hx    Stomach cancer Neg Hx    Esophageal cancer Neg Hx     Social History   Socioeconomic History   Marital status: Married    Spouse name: Not on file   Number of children: 2   Years of education: Not on file   Highest education level: 12th grade  Occupational History   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  Tobacco Use   Smoking status: Every Day    Current packs/day: 0.25    Average packs/day: 0.4 packs/day for 48.4 years (20.1 ttl pk-yrs)    Types: Cigarettes    Start date: 11/30/2023    Passive exposure: Never   Smokeless tobacco: Never   Tobacco comments:    11/18/20 in process of quiting    03/31/23 5 cigarettes a day   Vaping Use   Vaping status: Never Used  Substance and Sexual Activity   Alcohol  use: Never   Drug use: Never   Sexual activity: Not Currently    Birth control/protection: None,  Surgical    Comment: Hysterctomy  Other Topics Concern   Not on file  Social History Narrative   Lives with husband.        Social Drivers of Corporate investment banker Strain: Low Risk  (09/19/2023)   Overall Financial Resource Strain (CARDIA)    Difficulty of Paying Living Expenses: Not hard at all  Food  Insecurity: No Food Insecurity (03/21/2024)   Hunger Vital Sign    Worried About Running Out of Food in the Last Year: Never true    Ran Out of Food in the Last Year: Never true  Transportation Needs: No Transportation Needs (03/21/2024)   PRAPARE - Administrator, Civil Service (Medical): No    Lack of Transportation (Non-Medical): No  Physical Activity: Unknown (09/19/2023)   Exercise Vital Sign    Days of Exercise per Week: 0 days    Minutes of Exercise per Session: Not on file  Stress: Stress Concern Present (09/19/2023)   [employer] of Occupational Health - Occupational Stress Questionnaire    Feeling of Stress : Very much  Social Connections: Socially Integrated (03/21/2024)   Social Connection and Isolation Panel    Frequency of Communication with Friends and Family: More than three times a week    Frequency of Social Gatherings with Friends and Family: More than three times a week    Attends Religious Services: More than 4 times per year    Active Member of [employer] or Organizations: Yes    Attends Banker Meetings: 1 to 4 times per year    Marital Status: Married  Catering manager Violence: Not At Risk (03/21/2024)   Humiliation, Afraid, Rape, and Kick questionnaire    Fear of Current or Ex-Partner: No    Emotionally Abused: No    Physically Abused: No    Sexually Abused: No     Physical Exam   Vitals:   04/28/24 0400 04/28/24 0630  BP: (!) 178/47 (!) 172/40  Pulse: (!) 58 63  Resp: 17 (!) 22  Temp:    SpO2: 100% 100%    CONSTITUTIONAL: Well-appearing, NAD NEURO/PSYCH:  Alert and oriented x 3, no focal deficits EYES:  eyes equal  and reactive ENT/NECK:  no LAD, no JVD CARDIO: Regular rate, well-perfused, normal S1 and S2 PULM: Faint wheezes GI/GU:  non-distended, non-tender MSK/SPINE:  No gross deformities, no edema SKIN:  no rash, atraumatic   *Additional and/or pertinent findings included in MDM below  Diagnostic and Interventional Summary    EKG Interpretation Date/Time:  Friday April 27 2024 23:52:28 EDT Ventricular Rate:  58 PR Interval:  126 QRS Duration:  98 QT Interval:  468 QTC Calculation: 459 R Axis:   14  Text Interpretation: Sinus bradycardia with Premature atrial complexes Nonspecific ST abnormality Abnormal ECG When compared with ECG of 20-Mar-2024 16:15, PREVIOUS ECG IS PRESENT Confirmed by Theadore Sharper [phone]) on 04/28/2024 2:35:11 AM       Labs Reviewed  BASIC METABOLIC PANEL WITH GFR - Abnormal; Notable for the following components:      Result Value   Potassium 3.4 (*)    BUN 47 (*)    Creatinine, Ser 5.81 (*)    Calcium  10.5 (*)    GFR, Estimated 7 (*)    All other components within normal limits  CBC - Abnormal; Notable for the following components:   RBC 3.23 (*)    Hemoglobin 10.4 (*)    HCT 32.8 (*)    MCV 101.5 (*)    nRBC 0.9 (*)    All other components within normal limits  BRAIN NATRIURETIC PEPTIDE - Abnormal; Notable for the following components:   B Natriuretic Peptide 757.7 (*)    All other components within normal limits  D-DIMER, QUANTITATIVE - Abnormal; Notable for the following components:   D-Dimer, Quant 0.77 (*)    All other components within  normal limits  TROPONIN I (HIGH SENSITIVITY) - Abnormal; Notable for the following components:   Troponin I (High Sensitivity) 46 (*)    All other components within normal limits  TROPONIN I (HIGH SENSITIVITY) - Abnormal; Notable for the following components:   Troponin I (High Sensitivity) 56 (*)    All other components within normal limits    CT Chest Wo Contrast  Final Result    DG Chest 2 View  Final  Result      Medications  acetaminophen  (TYLENOL ) tablet 650 mg (has no administration in time range)    Or  acetaminophen  (TYLENOL ) suppository 650 mg (has no administration in time range)  methylPREDNISolone  sodium succinate (SOLU-MEDROL ) 125 mg/2 mL injection 80 mg (has no administration in time range)  ipratropium-albuterol  (DUONEB) 0.5-2.5 (3) MG/3ML nebulizer solution 3 mL (3 mLs Nebulization Given 04/28/24 0726)  albuterol  (PROVENTIL ) (2.5 MG/3ML) 0.083% nebulizer solution 2.5 mg (has no administration in time range)  hydrALAZINE  (APRESOLINE ) injection 10 mg (has no administration in time range)  ipratropium-albuterol  (DUONEB) 0.5-2.5 (3) MG/3ML nebulizer solution 3 mL (3 mLs Nebulization Given 04/28/24 0342)  methylPREDNISolone  sodium succinate (SOLU-MEDROL ) 125 mg/2 mL injection 125 mg (125 mg Intravenous Given 04/28/24 0342)  nitroGLYCERIN  (NITROSTAT ) SL tablet 0.4 mg (0.4 mg Sublingual Given 04/28/24 0434)     Procedures  /  Critical Care .Critical Care  Performed by: Theadore Ozell HERO, MD Authorized by: Theadore Ozell HERO, MD   Critical care provider statement:    Critical care time (minutes):  35   Critical care was necessary to treat or prevent imminent or life-threatening deterioration of the following conditions: Hypertensive urgency.   Critical care was time spent personally by me on the following activities:  Development of treatment plan with patient or surrogate, discussions with consultants, evaluation of patient's response to treatment, examination of patient, ordering and review of laboratory studies, ordering and review of radiographic studies, ordering and performing treatments and interventions, pulse oximetry, re-evaluation of patient's condition and review of old charts   ED Course and Medical Decision Making  Initial Impression and Ddx Patient is tachypneic, hypertensive, fairly prominent lower extremity edema.  Favoring fluid overloaded state likely in the setting of  acute on chronic CKD.  Also has a history of CHF which may be contributing.  Has a history of COPD and does have some wheezing on exam and so this could be contributing as well.  Endorses compliance with her Lasix .  Providing oxygen for comfort.  Chest x-ray without evidence of edema which is surprising, obtaining CT chest to further evaluate.  PE considered felt to be less likely.  Past medical/surgical history that increases complexity of ED encounter: CKD  Interpretation of Diagnostics I personally reviewed the Chest Xray and my interpretation is as follows: No pulmonary edema  No significant blood count or electrolyte disturbance.  Mild troponin and BNP elevation  Patient Reassessment and Ultimate Disposition/Management     Patient looks more comfortable with 2 L nasal cannula, managing her blood pressure with nitro tablet, D-dimer seems to be negative when age-adjusted.  CT chest without contrast without obvious explanation of patient's dyspnea.  Could be hypertensive urgency.  Admitted to medicine.  Patient management required discussion with the following services or consulting groups:  Hospitalist Service  Complexity of Problems Addressed Acute illness or injury that poses threat of life of bodily function  Additional Data Reviewed and Analyzed Further history obtained from: Further history from spouse/family member  Additional Factors Impacting ED Encounter  Risk Consideration of hospitalization  Ozell HERO. Theadore, MD [facility] Emergency Medicine [facility] [email or web address]  Final Clinical Impressions(s) / ED Diagnoses     ICD-10-CM   1. Hypertensive urgency  I16.0       ED Discharge Orders     None        Discharge Instructions Discussed with and Provided to Patient:   Discharge Instructions   None      Theadore Ozell HERO, MD 04/28/24 [phone]

## 2024-04-28 NOTE — Progress Notes (Signed)
  Carryover admission to the Day Admitter.  I discussed this case with the EDP, Dr. Theadore.  Per these discussions:   This is a 78 year old female with history of CKD 4/5, not currently on hemodialysis, although she does have an AV fistula in place, COPD, hypertension, who is being admitted with suspected acute COPD exacerbation after presenting with progressive shortness of breath over the last few days, associated with mild wheezing.   No evidence of acute hypoxia in the ED, although the patient symptomatically felt better with initiation of 2 L nasal cannula.   Initial systolic blood pressure elevated to the low 200s, which is improved following a dose of sublingual nitroglycerin  which was given for antihypertensive purposes, with blood pressure improvement also coinciding with initiation of 2 L nasal cannula for patient comfort, as above.  CT chest showed no evidence of acute cardiopulmonary process, including no evidence of pulmonary edema.  D-dimer was negative following age adjustment.  In the ED she also received Solu-Medrol , DuoNeb Iser treatment.  I have placed an order for observation to PCU for further evaluation management of suspected acute COPD exacerbation.  I have placed some additional preliminary admit orders via the adult multi-morbid admission order set. I have also ordered Solu-Medrol , duo nebulizers, prn albuterol  nebulizers.  For her elevated blood pressure, I have also added as needed IV hydralazine .    Eva Pore, DO Hospitalist

## 2024-04-29 ENCOUNTER — Encounter (HOSPITAL_COMMUNITY): Payer: Self-pay | Admitting: Family Medicine

## 2024-04-29 ENCOUNTER — Inpatient Hospital Stay (HOSPITAL_COMMUNITY)

## 2024-04-29 DIAGNOSIS — R7989 Other specified abnormal findings of blood chemistry: Secondary | ICD-10-CM | POA: Diagnosis not present

## 2024-04-29 DIAGNOSIS — N184 Chronic kidney disease, stage 4 (severe): Secondary | ICD-10-CM

## 2024-04-29 DIAGNOSIS — E878 Other disorders of electrolyte and fluid balance, not elsewhere classified: Secondary | ICD-10-CM

## 2024-04-29 DIAGNOSIS — M7989 Other specified soft tissue disorders: Secondary | ICD-10-CM | POA: Diagnosis not present

## 2024-04-29 DIAGNOSIS — J9601 Acute respiratory failure with hypoxia: Secondary | ICD-10-CM

## 2024-04-29 DIAGNOSIS — D509 Iron deficiency anemia, unspecified: Secondary | ICD-10-CM | POA: Diagnosis not present

## 2024-04-29 DIAGNOSIS — F17210 Nicotine dependence, cigarettes, uncomplicated: Secondary | ICD-10-CM

## 2024-04-29 DIAGNOSIS — K219 Gastro-esophageal reflux disease without esophagitis: Secondary | ICD-10-CM

## 2024-04-29 DIAGNOSIS — E1165 Type 2 diabetes mellitus with hyperglycemia: Secondary | ICD-10-CM

## 2024-04-29 DIAGNOSIS — J441 Chronic obstructive pulmonary disease with (acute) exacerbation: Secondary | ICD-10-CM

## 2024-04-29 DIAGNOSIS — I10 Essential (primary) hypertension: Secondary | ICD-10-CM | POA: Diagnosis not present

## 2024-04-29 DIAGNOSIS — R0602 Shortness of breath: Secondary | ICD-10-CM | POA: Diagnosis not present

## 2024-04-29 LAB — COMPREHENSIVE METABOLIC PANEL WITH GFR
ALT: 14 U/L (ref 0–44)
AST: 23 U/L (ref 15–41)
Albumin: 2.4 g/dL — ABNORMAL LOW (ref 3.5–5.0)
Alkaline Phosphatase: 64 U/L (ref 38–126)
Anion gap: 11 (ref 5–15)
BUN: 63 mg/dL — ABNORMAL HIGH (ref 8–23)
CO2: 25 mmol/L (ref 22–32)
Calcium: 9.9 mg/dL (ref 8.9–10.3)
Chloride: 100 mmol/L (ref 98–111)
Creatinine, Ser: 6.07 mg/dL — ABNORMAL HIGH (ref 0.44–1.00)
GFR, Estimated: 7 mL/min — ABNORMAL LOW (ref >60)
Glucose, Bld: 336 mg/dL — ABNORMAL HIGH (ref 70–99)
Potassium: 4.1 mmol/L (ref 3.5–5.1)
Sodium: 136 mmol/L (ref 135–145)
Total Bilirubin: 0.6 mg/dL (ref 0.0–1.2)
Total Protein: 5.5 g/dL — ABNORMAL LOW (ref 6.5–8.1)

## 2024-04-29 LAB — PHOSPHORUS: Phosphorus: 5.8 mg/dL — ABNORMAL HIGH (ref 2.5–4.6)

## 2024-04-29 LAB — CBC
HCT: 29.5 % — ABNORMAL LOW (ref 36.0–46.0)
Hemoglobin: 9.5 g/dL — ABNORMAL LOW (ref 12.0–15.0)
MCH: 32.2 pg (ref 26.0–34.0)
MCHC: 32.2 g/dL (ref 30.0–36.0)
MCV: 100 fL (ref 80.0–100.0)
Platelets: 222 K/uL (ref 150–400)
RBC: 2.95 MIL/uL — ABNORMAL LOW (ref 3.87–5.11)
RDW: 13 % (ref 11.5–15.5)
WBC: 13.6 K/uL — ABNORMAL HIGH (ref 4.0–10.5)
nRBC: 0.7 % — ABNORMAL HIGH (ref 0.0–0.2)

## 2024-04-29 LAB — GLUCOSE, CAPILLARY
Glucose-Capillary: 271 mg/dL — ABNORMAL HIGH (ref 70–99)
Glucose-Capillary: 296 mg/dL — ABNORMAL HIGH (ref 70–99)
Glucose-Capillary: 420 mg/dL — ABNORMAL HIGH (ref 70–99)
Glucose-Capillary: 337 mg/dL — ABNORMAL HIGH (ref 70–99)

## 2024-04-29 LAB — MAGNESIUM: Magnesium: 2.5 mg/dL — ABNORMAL HIGH (ref 1.7–2.4)

## 2024-04-29 MED ORDER — PANTOPRAZOLE SODIUM 40 MG IV SOLR
40.0000 mg | Freq: Two times a day (BID) | INTRAVENOUS | Status: DC
  Administered 2024-04-29 – 2024-04-30 (×3): 40 mg via INTRAVENOUS
  Filled 2024-04-29 (×2): qty 10

## 2024-04-29 MED ORDER — HEPARIN SODIUM (PORCINE) 5000 UNIT/ML IJ SOLN
5000.0000 [IU] | Freq: Three times a day (TID) | INTRAMUSCULAR | Status: DC
  Administered 2024-04-29 – 2024-05-02 (×16): 5000 [IU] via SUBCUTANEOUS
  Filled 2024-04-29 (×9): qty 1

## 2024-04-29 MED ORDER — FUROSEMIDE 10 MG/ML IJ SOLN
80.0000 mg | Freq: Two times a day (BID) | INTRAMUSCULAR | Status: DC
  Administered 2024-04-29 – 2024-05-01 (×9): 80 mg via INTRAVENOUS
  Filled 2024-04-29 (×6): qty 8

## 2024-04-29 MED ORDER — HEPARIN BOLUS VIA INFUSION
3000.0000 [IU] | Freq: Once | INTRAVENOUS | Status: DC
  Filled 2024-04-29: qty 3000

## 2024-04-29 MED ORDER — INSULIN ASPART 100 UNIT/ML IJ SOLN
0.0000 [IU] | Freq: Three times a day (TID) | INTRAMUSCULAR | Status: DC
  Administered 2024-04-30: 11 [IU] via SUBCUTANEOUS
  Administered 2024-04-30 (×2): 15 [IU] via SUBCUTANEOUS
  Administered 2024-04-30: 20 [IU] via SUBCUTANEOUS
  Administered 2024-04-30: 11 [IU] via SUBCUTANEOUS
  Administered 2024-04-30 – 2024-05-01 (×2): 20 [IU] via SUBCUTANEOUS
  Administered 2024-05-01 (×2): 4 [IU] via SUBCUTANEOUS
  Administered 2024-05-01: 15 [IU] via SUBCUTANEOUS
  Administered 2024-05-01: 20 [IU] via SUBCUTANEOUS
  Administered 2024-05-01: 15 [IU] via SUBCUTANEOUS
  Administered 2024-05-02 (×2): 3 [IU] via SUBCUTANEOUS

## 2024-04-29 MED ORDER — INSULIN GLARGINE-YFGN 100 UNIT/ML ~~LOC~~ SOLN
20.0000 [IU] | Freq: Every day | SUBCUTANEOUS | Status: DC
  Administered 2024-04-29: 20 [IU] via SUBCUTANEOUS
  Filled 2024-04-29 (×2): qty 0.2

## 2024-04-29 MED ORDER — HEPARIN (PORCINE) 25000 UT/250ML-% IV SOLN: 1250.0000 [IU]/h | INTRAVENOUS | Status: DC

## 2024-04-29 MED ORDER — INSULIN ASPART 100 UNIT/ML IJ SOLN
0.0000 [IU] | Freq: Every day | INTRAMUSCULAR | Status: DC
  Administered 2024-04-29 – 2024-04-30 (×3): 4 [IU] via SUBCUTANEOUS
  Administered 2024-05-01 (×2): 5 [IU] via SUBCUTANEOUS

## 2024-04-29 MED ORDER — ARFORMOTEROL TARTRATE 15 MCG/2ML IN NEBU
15.0000 ug | INHALATION_SOLUTION | Freq: Two times a day (BID) | RESPIRATORY_TRACT | Status: DC
  Administered 2024-04-29 – 2024-05-02 (×11): 15 ug via RESPIRATORY_TRACT
  Filled 2024-04-29 (×6): qty 2

## 2024-04-29 MED ORDER — REVEFENACIN 175 MCG/3ML IN SOLN
175.0000 ug | Freq: Every day | RESPIRATORY_TRACT | Status: DC
  Administered 2024-04-30 – 2024-05-02 (×6): 175 ug via RESPIRATORY_TRACT
  Filled 2024-04-29 (×3): qty 3

## 2024-04-29 MED ORDER — TECHNETIUM TO 99M ALBUMIN AGGREGATED
4.3000 | Freq: Once | INTRAVENOUS | Status: AC | PRN
  Administered 2024-04-29: 4.3 via INTRAVENOUS

## 2024-04-29 MED ORDER — BUDESONIDE 0.25 MG/2ML IN SUSP
0.2500 mg | Freq: Two times a day (BID) | RESPIRATORY_TRACT | Status: DC
  Administered 2024-04-29 – 2024-05-02 (×11): 0.25 mg via RESPIRATORY_TRACT
  Filled 2024-04-29 (×6): qty 2

## 2024-04-29 NOTE — Plan of Care (Signed)

## 2024-04-29 NOTE — Progress Notes (Signed)
 BLE venous exam is completed. Teffany Blaszczyk, RVT

## 2024-04-29 NOTE — Consult Note (Addendum)
 Renal Service Consult Note [name]  [name] 04/29/2024 Karen JONETTA Fret, MD Requesting Physician: Dr. Elpidio  Reason for Consult: Renal failure HPI: The patient is a 78 y.o. year-old w/ PMH sig for CKD 5, CAD, CVA, hx CVA, DM2, HTN, PAD, COPD who presented to ED for SOB. Worsening over the past several days, also ^LE edema, weak and very tired. Feeling cold. No cP, abd pain, no cough. In ED BP 190/50, HR 74, RR 18, temp 98, 2-4 L [place] O2. K+ 4.1, creat 6.07, bun 63, phos 5.8.  Mg 2.4, alb 2.4. Tbili 0.6.  WBC 13K, Hb 9.5.  CXR was clear/ no active disease. CT chest w/o showed no edema or infection. Pt was admitted for COPD exacerbation and AHRF. We are asked to see for renal failure.   Pt seen in room. Lying flat she is okay for a while, then every 2-3 minutes she has to reposition herself, or sit up on the side of the bed. She is upset because she still is having this SOB and she was admitted here in early July for the same thing and it is still not figured out. +orthopnea, + mild LE edema, no sig coughing.   Regarding uremia, pt denies any confusion, sig fatigue, loss of appetite, N/V, diarrhea or jerking of extremities. They stopped the zaroxolyn during the July admission because it was too strong and they continued her po lasix , and per the pt in a couple weeks the creatinine came down about 4 points.   Pt had R BC AVF created on 12/21/23, then underwent superficialization on 02/01/24. Seen in [facility] office on 6/09, pt was having cramping in the R hand. [facility] recommended to exercise the R hand. Pt was aware of possible need to pursue AVF ligation in the future if she cannot tolerate the hand cramping.    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,  small   Anxiety    Arthritis    Cataract    bilateral - surgery   CKD (chronic kidney disease), stage IV (HCC)    followed by [facility]   Colon polyps    Depression    Diabetes mellitus 1998   type 2   Diverticulosis    Emphysema of lung (HCC)    no one has evry told me that   ESOPHAGEAL STRICTURE 08/27/2008   Family history of adverse reaction to anesthesia    daughter n/v   GERD (gastroesophageal reflux disease)    Glaucoma    bilateral   Heart failure (HCC) 01/04/2024   Hemorrhoids    hx   Hiatal hernia    Hyperlipidemia    Hypertension 2000   Migraines    Neuropathy    fingers and toes   Peripheral vascular disease (HCC)    Phlebitis    30 years ago  left leg   Stroke [facility])    multiple mini strokes ( brain aneurysm ).  Right side weaker. 2 strokes   Past Surgical History  Past Surgical History:  Procedure Laterality Date   ABDOMINAL AORTAGRAM N/A 01/04/2012   Procedure: ABDOMINAL AORTAGRAM;  Surgeon: Gaile LELON New, MD;  Location: [facility] CATH LAB;  Service: Cardiovascular;  Laterality:  N/A;   ABDOMINAL AORTAGRAM N/A 08/15/2012   Procedure: ABDOMINAL AORTAGRAM;  Surgeon: Gaile LELON New, MD;  Location: [name] CATH LAB;  Service: Cardiovascular;  Laterality: N/A;   ABDOMINAL AORTOGRAM W/LOWER EXTREMITY Left 11/17/2021   Procedure: ABDOMINAL AORTOGRAM W/LOWER EXTREMITY;  Surgeon: New Gaile LELON, MD;  Location: [facility] INVASIVE CV LAB;  Service: Cardiovascular;  Laterality: Left;   ABDOMINAL AORTOGRAM W/LOWER EXTREMITY N/A 03/09/2022   Procedure: ABDOMINAL AORTOGRAM W/LOWER EXTREMITY;  Surgeon: New Gaile LELON, MD;  Location: [facility] INVASIVE CV LAB;  Service: Cardiovascular;  Laterality: N/A;   ABDOMINAL AORTOGRAM W/LOWER EXTREMITY N/A 03/15/2023   Procedure: ABDOMINAL AORTOGRAM W/LOWER EXTREMITY;  Surgeon: New Gaile LELON, MD;  Location: [facility] INVASIVE CV LAB;  Service: Cardiovascular;  Laterality: N/A;   ABDOMINAL HYSTERECTOMY  1984   AV FISTULA PLACEMENT  Right 12/21/2023   Procedure: RIGHT ARM BRACHIOCEPHALIC ARTERIOVENOUS (AV) FISTULA CREATION;  Surgeon: New Gaile LELON, MD;  Location: [facility] OR;  Service: Vascular;  Laterality: Right;   BREAST SURGERY     cataract surgery Right 10/22/2015   cataract surgery Left 11/05/2015   CESAREAN SECTION     CHOLECYSTECTOMY     COLONOSCOPY  07/2010   DENTAL SURGERY  05/05/2012   left lower    ESOPHAGOGASTRODUODENOSCOPY  07/2010   EYE SURGERY  07/2013   Laser-Glaucoma   FEMORAL ARTERY STENT  05/11/2011   Left superficial femoral and popliteal artery   FEMORAL-POPLITEAL BYPASS GRAFT Left 11/18/2021   Procedure: LEFT FEMORAL-POPLITEAL ARTERY BYPASS GRAFT;  Surgeon: New Gaile LELON, MD;  Location: [facility] OR;  Service: Vascular;  Laterality: Left;   FISTULA SUPERFICIALIZATION Right 02/01/2024   Procedure: RIGHT AV FISTULA SUPERFICIALIZATION;  Surgeon: New Gaile LELON, MD;  Location: [facility] OR;  Service: Vascular;  Laterality: Right;   HERNIA REPAIR     times two   KNEE SURGERY     LOWER EXTREMITY ANGIOGRAM Left 08/15/2012   Procedure: LOWER EXTREMITY ANGIOGRAM;  Surgeon: Gaile LELON New, MD;  Location: [facility] CATH LAB;  Service: Cardiovascular;  Laterality: Left;  lt leg angio   PERIPHERAL VASCULAR BALLOON ANGIOPLASTY Left 03/09/2022   Procedure: PERIPHERAL VASCULAR BALLOON ANGIOPLASTY;  Surgeon: New Gaile LELON, MD;  Location: [facility] INVASIVE CV LAB;  Service: Cardiovascular;  Laterality: Left;   PERIPHERAL VASCULAR BALLOON ANGIOPLASTY Left 03/15/2023   Procedure: PERIPHERAL VASCULAR BALLOON ANGIOPLASTY;  Surgeon: New Gaile LELON, MD;  Location: [facility] INVASIVE CV LAB;  Service: Cardiovascular;  Laterality: Left;   rotator cuff surgery     THYROID  SURGERY     Family History  Family History  Problem Relation Age of Onset   CAD Mother 65       Died of MI   Hypertension Mother    Heart attack Mother    Heart disease Mother    CAD Father 74       Died of MI   Heart disease Father    Heart attack Father    CAD  Brother 82       Two brothers died of MI   Heart attack Brother    Heart disease Brother        Amputation   Diabetes Sister    Hypertension Sister    Heart attack Brother    Heart disease Brother    Stroke Sister    Colon cancer Neg Hx    Stomach cancer Neg Hx    Esophageal cancer Neg Hx    Social History  reports that she has been smoking cigarettes. She started smoking  about 4 months ago. She has a 20.1 pack-year smoking history. She has never been exposed to tobacco smoke. She has never used smokeless tobacco. She reports that she does not drink alcohol  and does not use drugs. Allergies  Allergies  Allergen Reactions   Ciprofloxacin  Swelling   Ozempic  (0.25 Or 0.5 Mg-Dose) [Semaglutide (0.25 Or 0.5mg -Dos)] Nausea And Vomiting   Plavix [Clopidogrel Bisulfate] Palpitations   Amoxicillin Itching and Swelling    FACE & EYES SWELL   Ace Inhibitors Cough    Sore throat   Atorvastatin      myalgias   Lyrica [Pregabalin] Itching and Nausea Only     gain weight   Penicillins Other (See Comments)    Pt unsure if there is an allergic reaction    Home medications Prior to Admission medications   Medication Sig Start Date End Date Taking? Authorizing Provider  acetaminophen  (TYLENOL ) 500 MG tablet Take 1,000 mg by mouth 2 (two) times daily.   Yes [provider]  amLODipine  (NORVASC ) 10 MG tablet Take 1 tablet (10 mg total) by mouth daily. 01/10/24  Yes Arlon Carliss ORN, DO  aspirin  EC 81 MG tablet Take 1 tablet (81 mg total) by mouth daily. Swallow whole. 01/11/24  Yes Arlon Carliss ORN, DO  busPIRone  (BUSPAR ) 5 MG tablet Take 1 tablet (5 mg total) by mouth 2 (two) times daily. Patient taking differently: Take 5 mg by mouth at bedtime. 02/01/24  Yes Rhyne, [name] J, PA-C  calcitRIOL  (ROCALTROL ) 0.5 MCG capsule Take 0.5 mcg by mouth in the morning. 11/30/23  Yes [provider]  cloNIDine  (CATAPRES ) 0.1 MG tablet Take 1 tablet (0.1 mg total) by mouth at bedtime. 01/10/24   Yes Arlon Carliss ORN, DO  Fluticasone -Umeclidin-Vilant (TRELEGY ELLIPTA ) 200-62.5-25 MCG/ACT AEPB Inhale 1 puff into the lungs daily. 07/20/23  Yes Mannam, Praveen, MD  furosemide  (LASIX ) 40 MG tablet Take 80 mg by mouth 2 (two) times daily.   Yes [provider]  hydrALAZINE  (APRESOLINE ) 100 MG tablet Take 1 tablet (100 mg total) by mouth every 8 (eight) hours. 02/16/24  Yes Monge, Damien BROCKS, NP  insulin  aspart (NOVOLOG  FLEXPEN) 100 UNIT/ML FlexPen Inject 10-14 Units into the skin 3 (three) times daily before meals. 03/05/24  Yes Trixie File, MD  insulin  degludec (TRESIBA  FLEXTOUCH) 200 UNIT/ML FlexTouch Pen Inject 40 Units into the skin daily. 01/10/24  Yes Arlon Carliss ORN, DO  isosorbide  mononitrate (IMDUR ) 60 MG 24 hr tablet Take 1 tablet (60 mg total) by mouth daily. 01/11/24  Yes Arlon Carliss ORN, DO  metoprolol  tartrate (LOPRESSOR ) 25 MG tablet Take 1 tablet (25 mg total) by mouth 2 (two) times daily. 02/07/24 05/07/24 Yes Monge, Damien BROCKS, NP  Omega-3 Fatty Acids (FISH OIL) 1000 MG CAPS Take 1,000 mg by mouth in the morning and at bedtime. 11/21/21  Yes Bethanie Cough, PA-C  Potassium Chloride  ER 20 MEQ TBCR Take 2 tablets (40 mEq total) by mouth every other day. 03/22/24  Yes Raenelle Coria, MD  rOPINIRole  (REQUIP ) 0.25 MG tablet Take 1 tablet (0.25 mg total) by mouth at bedtime. Take 0.25 mg by mouth at bedtime. 02/27/24  Yes Paseda, Folashade R, FNP  rosuvastatin  (CRESTOR ) 10 MG tablet Take 1 tablet (10 mg total) by mouth daily. 03/20/24 06/18/24 Yes Devora Prom D, NP     Vitals:   04/29/24 0729 04/29/24 0810 04/29/24 0811 04/29/24 1127  BP: (!) 171/45   (!) 153/42  Pulse: 70 (!) 58  (!) 58  Resp: (!) 21 17  20  Temp: 98 F (36.7 C)   97.7 F (36.5 C)  TempSrc: Oral   Oral  SpO2: 98% 100% 100% 100%  Weight:      Height:       Exam Gen alert, no distress, on 2L [place] O2 A bit restless, has too sit up if lying in bed for too long No rash, cyanosis or gangrene Sclera anicteric,  throat clear  Neck veins up 14 cm at 45 deg Chest faint bibasilar crackles at the very bottoms of the lungs RRR no MRG Abd soft ntnd no mass or ascites +bs GU deferred MS no joint effusions or deformity Ext 1+ bilat pretibial/ ankle edema, no other edema Neuro is alert, Ox 3 , nf, no asterixis    RUA AVF+bruit   Home bp meds: Norvasc  10 every day Clonidine  0.1 hs Lasix  80 bid Hydralazine  100 tid Lopressor  25 bid Others: asa, buspar , rocaltrol , trelegy ellipta , insulin  tresiba / novolog , imdur , Kdur, requip , crestor   Date   Creat  eGFR (ml/min) 2009- 2016  0.86- 1.30 2017-2018  0.96- 1.30 2019   1.24- 1.44 2020   1.59 2021   1.81- 2.42 2022   1.78- 1.93 2023   1.79- 2.52    2024   2.15- 3.04 15- 23 ml/min  april 2025  2.93- 3.46 13- 16 ml/min may 2025  4.60  9 ml/min   7/01- 03/21/24  8.48- 8.71 4 ml/min  8/09   5.81  7    04/29/24  6.07  7  CXR 8/09 - no mention of any edema/ congestion CT chest 8/09 - no mention of any vol excess/ edema NM pulm perfusion 8/10 - normal exam    Assessment/ Plan: AKI on CKD 5: b/l creat 4.6 from may 2025, eGFR  from April 2025, eGFR 13-16 ml/min. Creat here was 5.8 on admission. Her main c/o is SOB and orthopnea. She has been taking her po lasix  80 bid at home, no recent changes in dosing. BP's are stable, on multiple BP meds. Imaging is not showing pulm edema, have consulted to help with the cause of her sig SOB/ orthopnea, appreciate their assistance. She is getting her home dose lasix  80mg  bid, and a renal diet (changed FR to 1200cc). Creat 5.8 could be AKI vs a new baseline. She is not grossly uremic, no sig fatigue, N/V, appetite is good, no confusion - so does not need RRT for uremia. She is a bit vol overloaded. Await pulm rec's. Get renal US , UA, urine lytes. Will follow.  SOB/ orthopnea: as above, consulting pulm team due to insignificant help from imaging.  Anemia ckd: Hb 9.5, follow.  HTN: per the patient has been hard to control for  over a year. Not bad right now at 165/41 range. Cont home meds.  H/o CVA PAD DM2 on insulin   7:00 pm: spoke w/ pulm/ CCM, they are addressing the COPD and reflux issues. They would agree w/ diuresing. Starting w/ IV lasix  80 q 12 hrs, titrate as needed.    Myer Fret  MD [facility] 04/29/2024, 1:59 PM  Recent Labs  Lab 04/28/24 0013 04/29/24 0333  HGB 10.4* 9.5*  ALBUMIN   --  2.4*  CALCIUM  10.5* 9.9  PHOS  --  5.8*  CREATININE 5.81* 6.07*  K 3.4* 4.1   Inpatient medications:  amLODipine   10 mg Oral Daily   arformoterol   15 mcg Nebulization BID   aspirin  EC  81 mg Oral Daily   budesonide  (PULMICORT ) nebulizer solution  0.25  mg Nebulization BID   busPIRone   5 mg Oral BID   calcitRIOL   0.5 mcg Oral q AM   cloNIDine   0.2 mg Oral QHS   doxycycline   100 mg Oral Q12H   furosemide   80 mg Oral BID   heparin   3,000 Units Intravenous Once   hydrALAZINE   100 mg Oral Q8H   insulin  aspart  0-20 Units Subcutaneous TID WC   insulin  aspart  0-5 Units Subcutaneous QHS   insulin  aspart  10-14 Units Subcutaneous TID AC   insulin  glargine-yfgn  20 Units Subcutaneous QHS   isosorbide  mononitrate  60 mg Oral Daily   metoprolol  tartrate  25 mg Oral BID   predniSONE   40 mg Oral QAC breakfast   [START ON 04/30/2024] revefenacin   175 mcg Nebulization Daily   rOPINIRole   0.25 mg Oral QHS   rosuvastatin   10 mg Oral Daily    heparin      acetaminophen , ipratropium-albuterol 

## 2024-04-29 NOTE — Progress Notes (Signed)
 Patient off to radiology for pul perfusion test.

## 2024-04-29 NOTE — Progress Notes (Addendum)
 PHARMACY - ANTICOAGULATION CONSULT NOTE  Pharmacy Consult for heparin  Indication: pulmonary embolus and LE clot  Allergies  Allergen Reactions   Ciprofloxacin  Swelling   Ozempic  (0.25 Or 0.5 Mg-Dose) [Semaglutide (0.25 Or 0.5mg -Dos)] Nausea And Vomiting   Plavix [Clopidogrel Bisulfate] Palpitations   Amoxicillin Itching and Swelling    FACE & EYES SWELL   Ace Inhibitors Cough    Sore throat   Atorvastatin      myalgias   Lyrica [Pregabalin] Itching and Nausea Only     gain weight   Penicillins Other (See Comments)    Pt unsure if there is an allergic reaction     Patient Measurements: Height: 5' 6 (167.6 cm) Weight: 81.2 kg (179 lb 0.2 oz) IBW/kg (Calculated) : 59.3 HEPARIN  DW (KG): 76.2  Vital Signs: Temp: 97.7 F (36.5 C) (08/10 1127) Temp Source: Oral (08/10 1127) BP: 153/42 (08/10 1127) Pulse Rate: 58 (08/10 1127)  Labs: Recent Labs    04/28/24 0013 04/28/24 0215 04/29/24 0333  HGB 10.4*  --  9.5*  HCT 32.8*  --  29.5*  PLT 254  --  222  CREATININE 5.81*  --  6.07*  TROPONINIHS 46* 56*  --     Estimated Creatinine Clearance: 8.3 mL/min (A) (by C-G formula based on SCr of 6.07 mg/dL (H)).   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,  small   Anxiety    Arthritis    Cataract    bilateral - surgery   CKD (chronic kidney disease), stage IV (HCC)    followed by [facility]   Colon polyps    Depression    Diabetes mellitus 1998   type 2   Diverticulosis    Emphysema of lung (HCC)    no one has evry told me that   ESOPHAGEAL STRICTURE 08/27/2008   Family history of adverse reaction to anesthesia    daughter n/v   GERD (gastroesophageal reflux disease)    Glaucoma    bilateral   Heart failure (HCC) 01/04/2024   Hemorrhoids    hx   Hiatal hernia    Hyperlipidemia    Hypertension 2000   Migraines    Neuropathy     fingers and toes   Peripheral vascular disease (HCC)    Phlebitis    30 years ago  left leg   Stroke [facility])    multiple mini strokes ( brain aneurysm ).  Right side weaker. 2 strokes    Medications:  Scheduled:   amLODipine   10 mg Oral Daily   arformoterol   15 mcg Nebulization BID   aspirin  EC  81 mg Oral Daily   budesonide  (PULMICORT ) nebulizer solution  0.25 mg Nebulization BID   busPIRone   5 mg Oral BID   calcitRIOL   0.5 mcg Oral q AM   cloNIDine   0.2 mg Oral QHS   doxycycline   100 mg Oral Q12H   enoxaparin  (LOVENOX ) injection  30 mg Subcutaneous Q24H   furosemide   80 mg Oral BID   hydrALAZINE   100 mg Oral Q8H   insulin  aspart  0-20 Units Subcutaneous TID WC   insulin  aspart  0-5 Units Subcutaneous QHS   insulin  aspart  10-14 Units Subcutaneous TID AC   insulin  glargine-yfgn  20 Units Subcutaneous QHS   isosorbide  mononitrate  60 mg Oral Daily   metoprolol  tartrate  25 mg Oral BID  predniSONE   40 mg Oral QAC breakfast   [START ON 04/30/2024] revefenacin   175 mcg Nebulization Daily   rOPINIRole   0.25 mg Oral QHS   rosuvastatin   10 mg Oral Daily    Assessment: Pt is a 78 yo F with presumed PE and LE clot. Waiting on VQ scan to confirm PE, but want to proactively start treatment. No PTA anticoagulation. Patient is CKD5 not on dialysis - plan to give a reduced bolus. Baseline Hgb and PLT WNL.  Goal of Therapy:  Heparin  level 0.3-0.7 units/ml Monitor platelets by anticoagulation protocol: Yes   Plan:  Give 3000 units bolus x 1 Start heparin  infusion at 1250 units/hr Check anti-Xa level in 8 hours and daily while on heparin  Continue to monitor H&H and platelets  Izetta Carl, PharmD PGY1 Pharmacy Resident [facility]  04/29/2024 11:54 AM

## 2024-04-29 NOTE — Progress Notes (Signed)
 Patient back to the room. Plan of care continues.

## 2024-04-29 NOTE — Progress Notes (Addendum)
 PROGRESS NOTE  ADISSON DEAK  FMW:[id number] DOB: Sep 18, 1946 DOA: 04/27/2024 PCP: Lenon Nell SAILOR, FNP  Consultants  Brief Narrative: 78 y.o. female with medical history significant of coronary artery disease, CVA, carotid stenosis, history of brain aneurysm, history of CVA, diabetes non-insulin -dependent type II, chronic kidney disease stage V, COPD, pulmonary nodules, tobacco abuse, anxiety disorder, aortic atherosclerosis, migraine headaches, essential hypertension, hyperlipidemia and peripheral vascular disease who presents with acute on chronic shortness of breath.  Admitted for acute hypoxemic respiratory failure deemed secondary to COPD exacerbation.  Assessment & Plan: Acute hypoxic respiratory failure: - Treating for COPD exacerbation.  Lungs sound much better today-->ordered VQ scan to ensure not missing PE-->negative.   - anemic as below, may need transfusion if remains short of breath and Hgb continues to drop - Left lower extremity Doppler negative for clot. - VQ scan also with normal perfusion making PE much less likely. - Continue with COPD exacerbation treatment.  She feels a bit better today than she did yesterday status post DuoNebs.  On prednisone  and doxycycline .   - Lungs with some crackles at bases, continue furosemide .  Renal consult pending   COPD exacerbation: - Status post DuoNebs and Solu-Medrol  x 1 in the ER. - Will continue DuoNebs.  Will switch to prednisone  40 mg daily for 5-day course total steroids. - Starting doxycycline  for COPD exacerbation - Unclear trigger.   CKD stage V: - Will consult renal for further recommendations while inpatient. - She still makes urine.  She is on Lasix  80 mg p.o. twice daily outpatient, and will continue -Did have some crackles at bases. -Intake/output not measured, unclear how much urine output she's had.    Macrocytic anemia: - Baseline anemia seems to be about 10 for past month, she was previously 12-13 earlier this  year. - She denies any melena/blood loss. - Did have drop in her hemoglobin 9.5 today from 10.4 yesterday.  Will continue to trend.  If continues to drop would classify this as symptomatic anemia and transfuse 1 PRBC.  Right SFA occlusion:  - noted incidentally on doppler-->known occlusion, follows with vascular surgery.   - Known PAD and known hx/o of attempted left superficial femoral artery stenting in 2012 with early ISR, s/p balloon angioplasty which ultimately failed.  - Underwent left femoral to below-knee popliteal artery bypass graft with vein in March 2023. She developed distal anastomotic stenosis which required intervention on 03/09/2022. She underwent drug-coated balloon angioplasty to left femoral to popliteal bypass graft in June 2024.   - if has LE calf pain, would re-consult vascular surgery for re-assessment due to her extensive disease.    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 - Follow-up outpatient   Elevated troponin: - Chronic elevation - No chest pain. - EKG showed sinus bradycardia otherwise same as prior EKG.  No evidence of ischemia.   Diabetes mellitus type 2: - Admitted on Semglee  20 units.  CBGs have been elevated.  Added 15 units today. - Also starting sliding scale correction - A1c pending, will trend CBGs in house.   Hypokalemia: - Replaced in house.   Hypercalcemia: - Possibly spurious. - Will follow electrolytes and albumin .  Leukocytosis: - Secondary to steroids.  No signs of infection.  Trend   DVT prophylaxis:  Place TED hose Start: 04/28/24 1308--> start enoxaparin  PPx  Code Status:   Code Status: Full Code Level of care: Telemetry Medical Status is: Inpatient  Consults called: Nephrology  Subjective:  Breathing a little better today than yesterday, but still not to baseline.  Feels duonebs are helping.   Objective: Vitals:   04/29/24 0810 04/29/24 0811 04/29/24 1127 04/29/24  1530  BP:   (!) 153/42 (!) 165/41  Pulse: (!) 58  (!) 58 69  Resp: 17  20 20   Temp:   97.7 F (36.5 C) 97.8 F (36.6 C)  TempSrc:   Oral Axillary  SpO2: 100% 100% 100% 100%  Weight:      Height:       No intake or output data in the 24 hours ending 04/29/24 1612 Filed Weights   04/27/24 2359  Weight: 81.2 kg   Body mass index is 28.89 kg/m.  Gen: 78 y.o. female in no apparent distress.  Nontoxic Pulm: Non-labored breathing.  Some crackles bilateral bases.  No wheezing heard on examination this morning. CV: Regular rate and rhythm. No murmur, rub, or gallop. No JVD GI: Abdomen soft, non-tender, non-distended, with normoactive bowel sounds. No organomegaly or masses felt. Ext: Warm, no deformities, +2 bilateral lower extremity edema, left slightly greater than right  Skin: No rashes, lesions  Neuro: Alert and oriented. No focal neurological deficits. Psych: Calm  Judgement and insight appear normal. Mood & affect appropriate.     I have personally reviewed the following labs and images: CBC: Recent Labs  Lab 04/28/24 0013 04/29/24 0333  WBC 10.3 13.6*  HGB 10.4* 9.5*  HCT 32.8* 29.5*  MCV 101.5* 100.0  PLT 254 222   BMP &GFR Recent Labs  Lab 04/28/24 0013 04/29/24 0333  NA 138 136  K 3.4* 4.1  CL 100 100  CO2 27 25  GLUCOSE 97 336*  BUN 47* 63*  CREATININE 5.81* 6.07*  CALCIUM  10.5* 9.9  MG  --  2.5*  PHOS  --  5.8*   Estimated Creatinine Clearance: 8.3 mL/min (A) (by C-G formula based on SCr of 6.07 mg/dL (H)). Liver & Pancreas: Recent Labs  Lab 04/29/24 0333  AST 23  ALT 14  ALKPHOS 64  BILITOT 0.6  PROT 5.5*  ALBUMIN  2.4*   No results for input(s): LIPASE, AMYLASE in the last 168 hours. No results for input(s): AMMONIA in the last 168 hours. Diabetic: No results for input(s): HGBA1C in the last 72 hours. Recent Labs  Lab 04/28/24 1602 04/28/24 2101 04/29/24 0616 04/29/24 1131  GLUCAP 511* 487* 271* 296*   Cardiac Enzymes: No  results for input(s): CKTOTAL, CKMB, CKMBINDEX, TROPONINI in the last 168 hours. No results for input(s): PROBNP in the last 8760 hours. Coagulation Profile: No results for input(s): INR, PROTIME in the last 168 hours. Thyroid  Function Tests: No results for input(s): TSH, T4TOTAL, FREET4, T3FREE, THYROIDAB in the last 72 hours. Lipid Profile: No results for input(s): CHOL, HDL, LDLCALC, TRIG, CHOLHDL, LDLDIRECT in the last 72 hours. Anemia Panel: No results for input(s): VITAMINB12, FOLATE, FERRITIN, TIBC, IRON, RETICCTPCT in the last 72 hours. Urine analysis:    Component Value Date/Time   COLORURINE YELLOW 01/06/2024 1002   APPEARANCEUR HAZY (A) 01/06/2024 1002   LABSPEC 1.022 01/06/2024 1002   PHURINE 5.0 01/06/2024 1002   GLUCOSEU >=500 (A) 01/06/2024 1002   GLUCOSEU 500 (?) 01/30/2010 1056   HGBUR NEGATIVE 01/06/2024 1002   BILIRUBINUR NEGATIVE 01/06/2024 1002   KETONESUR NEGATIVE 01/06/2024 1002   PROTEINUR >=300 (A) 01/06/2024 1002   UROBILINOGEN 0.2 10/04/2014 1807   NITRITE NEGATIVE 01/06/2024 1002   LEUKOCYTESUR NEGATIVE 01/06/2024 1002   Sepsis Labs: Invalid input(s): PROCALCITONIN, LACTICIDVEN  Microbiology: No results found for this or any previous visit (from the past 240 hours).  Radiology Studies: VAS US  LOWER EXTREMITY VENOUS (DVT) Result Date: 04/29/2024  Lower Venous DVT Study Patient Name:  TOWANDA HORNSTEIN  Date of Exam:   04/29/2024 Medical Rec #: [id number]       Accession #:    [id number] Date of Birth: 03-11-1946        Patient Gender: F Patient Age:   77 years Exam Location:  [facility] Procedure:      VAS US  LOWER EXTREMITY VENOUS (DVT) Referring Phys: Pharoah Goggins --------------------------------------------------------------------------------  Indications: Swelling, and Edema.  Risk Factors: Pt has known PAD. Performing Technologist: Elmarie Lindau, RVT  Examination Guidelines: A complete  evaluation includes B-mode imaging, spectral Doppler, color Doppler, and power Doppler as needed of all accessible portions of each vessel. Bilateral testing is considered an integral part of a complete examination. Limited examinations for reoccurring indications may be performed as noted. The reflux portion of the exam is performed with the patient in reverse Trendelenburg.  +---------+---------------+---------+-----------+----------+--------------+ RIGHT    CompressibilityPhasicitySpontaneityPropertiesThrombus Aging +---------+---------------+---------+-----------+----------+--------------+ CFV      Full           Yes      Yes                                 +---------+---------------+---------+-----------+----------+--------------+ SFJ      Full                                                        +---------+---------------+---------+-----------+----------+--------------+ FV Prox  Full                                                        +---------+---------------+---------+-----------+----------+--------------+ FV Mid   Full                                                        +---------+---------------+---------+-----------+----------+--------------+ FV DistalFull                                                        +---------+---------------+---------+-----------+----------+--------------+ PFV      Full                                                        +---------+---------------+---------+-----------+----------+--------------+ POP      Full           Yes      Yes                                 +---------+---------------+---------+-----------+----------+--------------+  PTV      Full                                                        +---------+---------------+---------+-----------+----------+--------------+ PERO     Full                                                         +---------+---------------+---------+-----------+----------+--------------+ There is a heterogeneous, fluid filled appearing, nonvascularized area in the right popliteal space measuring 4.43 x 1.09 cm at its maximum diameter.  +---------+---------------+---------+-----------+----------+--------------+ LEFT     CompressibilityPhasicitySpontaneityPropertiesThrombus Aging +---------+---------------+---------+-----------+----------+--------------+ CFV      Full           Yes      Yes                                 +---------+---------------+---------+-----------+----------+--------------+ SFJ      Full                                                        +---------+---------------+---------+-----------+----------+--------------+ FV Prox  Full                                                        +---------+---------------+---------+-----------+----------+--------------+ FV Mid   Full                                                        +---------+---------------+---------+-----------+----------+--------------+ FV DistalFull                                                        +---------+---------------+---------+-----------+----------+--------------+ PFV      Full                                                        +---------+---------------+---------+-----------+----------+--------------+ POP      Full           Yes      Yes                                 +---------+---------------+---------+-----------+----------+--------------+ PTV      Full                                                        +---------+---------------+---------+-----------+----------+--------------+  PERO     Full                                                        +---------+---------------+---------+-----------+----------+--------------+ Incidental finding: There is a possible near occlusion vs occlusion in the right SFA.    Summary: RIGHT: - There is no  evidence of deep vein thrombosis in the lower extremity.  - A cystic structure is found in the popliteal fossa. - There is a heterogeneous, fluid filled appearing, nonvascularized area in the right popliteal space measuring 4.43 x 1.09 cm at its maximum diameter. There is an incidental finding of a near occluded versus an occluded right superficial femoral artery.  LEFT: - There is no evidence of deep vein thrombosis in the lower extremity.  - No cystic structure found in the popliteal fossa.  *See table(s) above for measurements and observations. Electronically signed by Norman Serve on 04/29/2024 at 4:09:40 PM.    Final    NM Pulmonary Perfusion Result Date: 04/29/2024 CLINICAL DATA:  High probability for pulmonary embolism. Shortness of breath. EXAM: NUCLEAR MEDICINE PERFUSION LUNG SCAN TECHNIQUE: Perfusion images were obtained in multiple projections after intravenous injection of radiopharmaceutical. Ventilation scans intentionally deferred if perfusion scan and chest x-ray adequate for interpretation during COVID 19 epidemic. RADIOPHARMACEUTICALS:  4.3 mCi Tc-79m MAA IV COMPARISON:  CT chest and chest x-ray 04/28/2024. FINDINGS: There is normal homogeneous perfusion throughout both lungs. No perfusion defects are identified. IMPRESSION: Normal examination. Electronically Signed   By: Greig Pique M.D.   On: 04/29/2024 15:56    Scheduled Meds:  amLODipine   10 mg Oral Daily   arformoterol   15 mcg Nebulization BID   aspirin  EC  81 mg Oral Daily   budesonide  (PULMICORT ) nebulizer solution  0.25 mg Nebulization BID   busPIRone   5 mg Oral BID   calcitRIOL   0.5 mcg Oral q AM   cloNIDine   0.2 mg Oral QHS   doxycycline   100 mg Oral Q12H   furosemide   80 mg Oral BID   hydrALAZINE   100 mg Oral Q8H   insulin  aspart  0-20 Units Subcutaneous TID WC   insulin  aspart  0-5 Units Subcutaneous QHS   insulin  aspart  10-14 Units Subcutaneous TID AC   insulin  glargine-yfgn  20 Units Subcutaneous QHS    isosorbide  mononitrate  60 mg Oral Daily   metoprolol  tartrate  25 mg Oral BID   predniSONE   40 mg Oral QAC breakfast   [START ON 04/30/2024] revefenacin   175 mcg Nebulization Daily   rOPINIRole   0.25 mg Oral QHS   rosuvastatin   10 mg Oral Daily   Continuous Infusions:   LOS: 1 day   35 minutes with more than 50% spent in reviewing records, counseling patient/family and coordinating care.  Reyes VEAR Gaw, MD [facility] [email or web address] 04/29/2024, 4:12 PM

## 2024-04-29 NOTE — Consult Note (Signed)
 NAME:  [name], MRN:  [id number], DOB:  [birth year], LOS: 1 ADMISSION DATE:  04/27/2024, CONSULTATION DATE:  8/10 REFERRING MD:  Geralynn , CHIEF COMPLAINT:  inc SOB   History of Present Illness:  78 year old female patient with a history of severe chronic struct of pulmonary disease with a FEV1 of 1.21 which is 50% predicted back in 2020.  She is followed by our clinic and was last seen in March 2025 at which time continued to report baseline exertional dyspnea.  She also has a history of a lung nodule which has been stable and being followed by imaging. She was admitted on 8/9 with chief complaint of shortness of breath.  Noted it to be increasing over approximately 3 days.  Prior to arrival.  Denied any URI symptoms.  No sick exposures.  Did endorse increased swelling lower extremities.  Left was greater than right both counts were painful she denied chest pain.  Denied any nausea or vomiting.  Did report decreased appetite due to shortness of breath.  She also had a fall at home. In the ER Diagnostic evaluation included the following: Portable chest x-ray this was essentially clear, there was known right lung basilar granuloma but no edema or infiltrate.  A noncontrast CT of chest was subsequently obtained showed some basilar bronchiectasis, scattered calcified 9 calcified nodules throughout both lungs but otherwise no infiltrates.  Chronic emphysematous changes were noted.   She was admitted for a working diagnosis of acute exacerbation exacerbation of her COPD  In ER she was not hypoxic but did report symptomatic improvement with supplemental oxygen.  Initial blood pressure systolically in the 200s which was treated with sublingual nitroglycerin . Therapeutic interventions have included the following: Supplemental oxygen, empiric doxycycline , scheduled bronchodilators, and oral systemic steroids. She has continued to have fairly significant shortness of breath over the course of her  hospitalization.  On physical exam apparently her wheezing had improved in comparison to the day of admission.  She had a VQ scan on the 10th which is day of pulmonary consult and this was negative for pulmonary emboli.  She had a left lower extremity ultrasound and this was negative for clot.  She has been seen by nephrology in regards to her underlying CKD stage V, but because of ongoing symptoms, and recurrent exacerbations with unclear triggers pulmonary has been asked to evaluate. Pertinent  Medical History  Ckd 5, CAD, carotid stenosis, prior CVA, history of brain aneurysm, non-insulin -dependent type 2 diabetes, pulmonary nodule, tobacco abuse, copd,  known history of esophageal stricture arthritis GERD hiatal hernia anxiety disorder, aortic arthrosclerosis, migraine headaches, hypertension, PVD. Pulmonary history, Last seen by Dr. Theophilus in March 2025 at which time still endorsed exertional dyspnea.  At that point reported that she felt cool as a cucumber at rest but would get quite short of breath with any exertion.  She had been using Spiriva  however this has been stopped due to throat soreness Was still actively smoking back in March 2025, had a 16-year pack history. In regards to her lung nodule that was first identified in 2022 this is remained stable She has been on Trelegy inhaler for her COPD PFTs on record FEV1 is 1.21 (50%) he FVC was 64% at 2.03 DL CO 84.6; Impression severe obstructive airway disease with minimal diffusion defect, this was back in 2020 follow-ups had been ordered but not completed. Last echocardiogram was In April 2025 at which point her EF was estimated at 60 to 65% there was no  regional wall abnormality there was mild left ventricular hypertrophy there was grade 1 diastolic dysfunction RV size and function was normal there was trivial mitral regurg there was no evidence of aortic stenosis Significant Hospital Events: Including procedures, antibiotic start and stop  dates in addition to other pertinent events   8/9 admitted with COPD exacerbation started on antibiotics bronchodilators systemic steroids CT chest negative for infiltrate stable calcified nodules which were small and not related to presenting symptoms.  Chest x-ray clear 8/10 VQ scan low probability of PE Ultrasound of bilateral lower extremities, was found to have incidental near occlusion of the right superficial femoral artery but no DVT.  And there was no DVT in the left lower extremity.  Interim History / Subjective:  Feels a little better  Objective    Blood pressure (!) 165/41, pulse 69, temperature 97.8 F (36.6 C), temperature source Axillary, resp. rate 20, height 5' 6 (1.676 m), weight 81.2 kg, SpO2 100%.        Intake/Output Summary (Last 24 hours) at 04/29/2024 1648 Last data filed at 04/29/2024 1600 Gross per 24 hour  Intake 240 ml  Output --  Net 240 ml   Filed Weights   04/27/24 2359  Weight: 81.2 kg    Examination: General: Pleasant 78 year old female patient sitting up in bed no acute distress but fairly frustrated about her breathing status HENT: Normocephalic atraumatic some jugular venous distention mucous membranes moist Lungs: Diffuse rales currently 2 L/min no accessory use speaking full sentences but endorses marked shortness of breath Cardiovascular: Soft holosystolic heart murmur Abdomen: Soft nontender Extremities: Warm dry bilateral lower extremity edema Neuro: Oriented x 3 GU: Voids  Resolved problem list   Assessment and Plan  Acute on chronic dyspnea  acute exacerbation of chronic obstructive pulmonary disease Severe baseline COPD Tobacco abuse Active gastroesophageal reflux disease History hiatal hernia History of esophageal stricture History of grade 1 diastolic heart failure Concern for obstructive sleep apnea Stage V CKD Hypertension Severe peripheral vascular disease Macrocytic anemia Popliteal cyst Elevated troponin Type 2  diabetes complicated by steroid-induced hyperglycemia Fluid and electrolyte imbalance  Pulmonary problem list Multifactorial dyspnea secondary to mixed picture of COPD and underlying cardiorenal syndrome, complicated by acute exacerbation of her chronic obstructive pulmonary disease  In regards to her COPD she does have fairly significant obstructive disease dating back from her prior PFTs.  I think her exacerbating factors in regards to her airway disease are most likely secondary to reflux.  She has a known history of a hiatal hernia, she has not been on her PPI, and endorses nightly reflux symptomology and active worsening of the symptoms if she is in a supine position.  She also endorses times where she is woken up and vomited, and furthermore even has trouble swallowing from time to time, of note she carries a history of an esophageal stricture but she really could not tell me much about that.  She does seem to get some symptomatic relief from bronchodilators.  In regards to her current bronchospasm this seems to have improved some with her current treatment  However she continues to have baseline exertional dyspnea, fatigue, and activity intolerance.  I do not think her chronic obstructive pulmonary disease is the primary contributing factor here.  I suspect a great deal of this is also from her underlying cardiorenal disease.  She has had difficult to control hypertension, diastolic heart dysfunction which I suspect is progressed, she has stage V renal disease with evidence of volume overload,  and finally I wonder about could there be some degree of obstructive sleep apnea.  Plan/recommendation Continue current bronchodilator regimen, she seems to get little bit better symptomatic response from nebulized therapies, she was on Trelegy prior to admission.  We will need to determine whether or not we return to this or stay with nebulized therapies.  She may be able to do the nebulized therapies  easier We should send her home with a rescue bronchodilator.   Complete 5 days of systemic steroids and doxycycline  Will order an esophagram to evaluate both esophageal stricture and reflux. Increased PPI to BID Reflux precautions If she has significant esophageal stricture perhaps she might be a candidate for outpatient GI evaluation I do not believe she would be a surgical candidate in regards to her history of a hiatal hernia We will check a a.m. ABG and continue to watch nocturnal pulse oximetry specifically looking for episodes of hypoxia at night, also a.m. hypoxia or hypercarbia.  Ultimately she would benefit from a polysomnogram to rule out sleep apnea as a potential contributing factor We will repeat a echocardiogram.  Specifically looking for evidence of RV dysfunction or some degree of worsening pulmonary artery dysfunction Finally really need to continue to optimize her cardiorenal treatment regimen specifically blood pressure management and volume volume status.  I do think this is also a major contributing factor at least equally if not even more so than her underlying obstructive lung disease   Best Practice (right click and Reselect all SmartList Selections daily)  Per primary   Labs   CBC: Recent Labs  Lab 04/28/24 0013 04/29/24 0333  WBC 10.3 13.6*  HGB 10.4* 9.5*  HCT 32.8* 29.5*  MCV 101.5* 100.0  PLT 254 222    Basic Metabolic Panel: Recent Labs  Lab 04/28/24 0013 04/29/24 0333  NA 138 136  K 3.4* 4.1  CL 100 100  CO2 27 25  GLUCOSE 97 336*  BUN 47* 63*  CREATININE 5.81* 6.07*  CALCIUM  10.5* 9.9  MG  --  2.5*  PHOS  --  5.8*   GFR: Estimated Creatinine Clearance: 8.3 mL/min (A) (by C-G formula based on SCr of 6.07 mg/dL (H)). Recent Labs  Lab 04/28/24 0013 04/29/24 0333  WBC 10.3 13.6*    Liver Function Tests: Recent Labs  Lab 04/29/24 0333  AST 23  ALT 14  ALKPHOS 64  BILITOT 0.6  PROT 5.5*  ALBUMIN  2.4*   No results for  input(s): LIPASE, AMYLASE in the last 168 hours. No results for input(s): AMMONIA in the last 168 hours.  ABG    Component Value Date/Time   TCO2 31 02/01/2024 0927     Coagulation Profile: No results for input(s): INR, PROTIME in the last 168 hours.  Cardiac Enzymes: No results for input(s): CKTOTAL, CKMB, CKMBINDEX, TROPONINI in the last 168 hours.  HbA1C: Hemoglobin A1C  Date/Time Value Ref Range Status  03/05/2024 03:12 PM 8.2 (A) 4.0 - 5.6 % Final  10/31/2023 02:34 PM 7.3 (A) 4.0 - 5.6 % Final   Hgb A1c MFr Bld  Date/Time Value Ref Range Status  08/08/2023 03:06 PM 8.9 (H) <5.7 % of total Hgb Final    Comment:    For someone without known diabetes, a hemoglobin A1c value of 6.5% or greater indicates that they may have  diabetes and this should be confirmed with a follow-up  test. . For someone with known diabetes, a value <7% indicates  that their diabetes is well controlled and a  value  greater than or equal to 7% indicates suboptimal  control. A1c targets should be individualized based on  duration of diabetes, age, comorbid conditions, and  other considerations. . Currently, no consensus exists regarding use of hemoglobin A1c for diagnosis of diabetes for children. .   08/04/2022 03:09 PM 7.6 (H) <5.7 % of total Hgb Final    Comment:    For someone without known diabetes, a hemoglobin A1c value of 6.5% or greater indicates that they may have  diabetes and this should be confirmed with a follow-up  test. . For someone with known diabetes, a value <7% indicates  that their diabetes is well controlled and a value  greater than or equal to 7% indicates suboptimal  control. A1c targets should be individualized based on  duration of diabetes, age, comorbid conditions, and  other considerations. . Currently, no consensus exists regarding use of hemoglobin A1c for diagnosis of diabetes for children. .     CBG: Recent Labs  Lab  04/28/24 1602 04/28/24 2101 04/29/24 0616 04/29/24 1131  GLUCAP 511* 487* 271* 296*    Review of Systems:   Review of Systems  Constitutional:  Positive for malaise/fatigue. Negative for fever and weight loss.  HENT: Negative.         Did not have URI symptoms, no nasal discharge or sore throat.  Does have a little bit of cough but this onset has been since admission not previously.  She notes her cough is primarily when she gets more short of breath.  Eyes: Negative.   Respiratory:  Positive for shortness of breath and wheezing.   Cardiovascular:  Positive for orthopnea, leg swelling and PND.  Gastrointestinal:  Positive for abdominal pain, heartburn, nausea and vomiting.  Genitourinary: Negative.   Musculoskeletal: Negative.   Skin: Negative.   Neurological: Negative.   Endo/Heme/Allergies: Negative.   Psychiatric/Behavioral:  Positive for depression.      Past Medical History:  She,  has a past medical history of Abnormal findings on esophagogastroduodenoscopy (EGD) (07/2010), Allergy, Aneurysm (HCC) (2003), Anxiety, Arthritis, Cataract, CKD (chronic kidney disease), stage IV (HCC), Colon polyps, Depression, Diabetes mellitus (1998), Diverticulosis, Emphysema of lung (HCC), ESOPHAGEAL STRICTURE (08/27/2008), Family history of adverse reaction to anesthesia, GERD (gastroesophageal reflux disease), Glaucoma, Heart failure (HCC) (01/04/2024), Hemorrhoids, Hiatal hernia, Hyperlipidemia, Hypertension (2000), Migraines, Neuropathy, Peripheral vascular disease (HCC), Phlebitis, and Stroke (HCC).   Surgical History:   Past Surgical History:  Procedure Laterality Date   ABDOMINAL AORTAGRAM N/A 01/04/2012   Procedure: ABDOMINAL AORTAGRAM;  Surgeon: Gaile LELON New, MD;  Location: [facility] CATH LAB;  Service: Cardiovascular;  Laterality: N/A;   ABDOMINAL AORTAGRAM N/A 08/15/2012   Procedure: ABDOMINAL EZELLA;  Surgeon: Gaile LELON New, MD;  Location: [facility] CATH LAB;  Service: Cardiovascular;   Laterality: N/A;   ABDOMINAL AORTOGRAM W/LOWER EXTREMITY Left 11/17/2021   Procedure: ABDOMINAL AORTOGRAM W/LOWER EXTREMITY;  Surgeon: New Gaile LELON, MD;  Location: [facility] INVASIVE CV LAB;  Service: Cardiovascular;  Laterality: Left;   ABDOMINAL AORTOGRAM W/LOWER EXTREMITY N/A 03/09/2022   Procedure: ABDOMINAL AORTOGRAM W/LOWER EXTREMITY;  Surgeon: New Gaile LELON, MD;  Location: [facility] INVASIVE CV LAB;  Service: Cardiovascular;  Laterality: N/A;   ABDOMINAL AORTOGRAM W/LOWER EXTREMITY N/A 03/15/2023   Procedure: ABDOMINAL AORTOGRAM W/LOWER EXTREMITY;  Surgeon: New Gaile LELON, MD;  Location: [facility] INVASIVE CV LAB;  Service: Cardiovascular;  Laterality: N/A;   ABDOMINAL HYSTERECTOMY  1984   AV FISTULA PLACEMENT Right 12/21/2023   Procedure: RIGHT ARM BRACHIOCEPHALIC ARTERIOVENOUS (AV) FISTULA CREATION;  Surgeon: Serene Gaile ORN, MD;  Location: [facility] OR;  Service: Vascular;  Laterality: Right;   BREAST SURGERY     cataract surgery Right 10/22/2015   cataract surgery Left 11/05/2015   CESAREAN SECTION     CHOLECYSTECTOMY     COLONOSCOPY  07/2010   DENTAL SURGERY  05/05/2012   left lower    ESOPHAGOGASTRODUODENOSCOPY  07/2010   EYE SURGERY  07/2013   Laser-Glaucoma   FEMORAL ARTERY STENT  05/11/2011   Left superficial femoral and popliteal artery   FEMORAL-POPLITEAL BYPASS GRAFT Left 11/18/2021   Procedure: LEFT FEMORAL-POPLITEAL ARTERY BYPASS GRAFT;  Surgeon: Serene Gaile ORN, MD;  Location: [facility] OR;  Service: Vascular;  Laterality: Left;   FISTULA SUPERFICIALIZATION Right 02/01/2024   Procedure: RIGHT AV FISTULA SUPERFICIALIZATION;  Surgeon: Serene Gaile ORN, MD;  Location: [facility] OR;  Service: Vascular;  Laterality: Right;   HERNIA REPAIR     times two   KNEE SURGERY     LOWER EXTREMITY ANGIOGRAM Left 08/15/2012   Procedure: LOWER EXTREMITY ANGIOGRAM;  Surgeon: Gaile ORN Serene, MD;  Location: [facility] CATH LAB;  Service: Cardiovascular;  Laterality: Left;  lt leg angio   PERIPHERAL VASCULAR BALLOON  ANGIOPLASTY Left 03/09/2022   Procedure: PERIPHERAL VASCULAR BALLOON ANGIOPLASTY;  Surgeon: Serene Gaile ORN, MD;  Location: [facility] INVASIVE CV LAB;  Service: Cardiovascular;  Laterality: Left;   PERIPHERAL VASCULAR BALLOON ANGIOPLASTY Left 03/15/2023   Procedure: PERIPHERAL VASCULAR BALLOON ANGIOPLASTY;  Surgeon: Serene Gaile ORN, MD;  Location: [facility] INVASIVE CV LAB;  Service: Cardiovascular;  Laterality: Left;   rotator cuff surgery     THYROID  SURGERY       Social History:   reports that she has been smoking cigarettes. She started smoking about 4 months ago. She has a 20.1 pack-year smoking history. She has never been exposed to tobacco smoke. She has never used smokeless tobacco. She reports that she does not drink alcohol  and does not use drugs.   Family History:  Her family history includes CAD (age of onset: 42) in her brother; CAD (age of onset: 26) in her father and mother; Diabetes in her sister; Heart attack in her brother, brother, father, and mother; Heart disease in her brother, brother, father, and mother; Hypertension in her mother and sister; Stroke in her sister. There is no history of Colon cancer, Stomach cancer, or Esophageal cancer.   Allergies Allergies  Allergen Reactions   Ciprofloxacin  Swelling   Ozempic  (0.25 Or 0.5 Mg-Dose) [Semaglutide (0.25 Or 0.5mg -Dos)] Nausea And Vomiting   Plavix [Clopidogrel Bisulfate] Palpitations   Amoxicillin Itching and Swelling    FACE & EYES SWELL   Ace Inhibitors Cough    Sore throat   Atorvastatin      myalgias   Lyrica [Pregabalin] Itching and Nausea Only     gain weight   Penicillins Other (See Comments)    Pt unsure if there is an allergic reaction      Home Medications  Prior to Admission medications   Medication Sig Start Date End Date Taking? Authorizing Provider  acetaminophen  (TYLENOL ) 500 MG tablet Take 1,000 mg by mouth 2 (two) times daily.   Yes [provider]  amLODipine  (NORVASC ) 10 MG tablet Take 1 tablet  (10 mg total) by mouth daily. 01/10/24  Yes Arlon Carliss ORN, DO  aspirin  EC 81 MG tablet Take 1 tablet (81 mg total) by mouth daily. Swallow whole. 01/11/24  Yes Arlon Carliss ORN, DO  busPIRone  (BUSPAR ) 5 MG tablet Take 1 tablet (5 mg  total) by mouth 2 (two) times daily. Patient taking differently: Take 5 mg by mouth at bedtime. 02/01/24  Yes Rhyne, [name] J, PA-C  calcitRIOL  (ROCALTROL ) 0.5 MCG capsule Take 0.5 mcg by mouth in the morning. 11/30/23  Yes [provider]  cloNIDine  (CATAPRES ) 0.1 MG tablet Take 1 tablet (0.1 mg total) by mouth at bedtime. 01/10/24  Yes Arlon Carliss ORN, DO  Fluticasone -Umeclidin-Vilant (TRELEGY ELLIPTA ) 200-62.5-25 MCG/ACT AEPB Inhale 1 puff into the lungs daily. 07/20/23  Yes Mannam, Praveen, MD  furosemide  (LASIX ) 40 MG tablet Take 80 mg by mouth 2 (two) times daily.   Yes [provider]  hydrALAZINE  (APRESOLINE ) 100 MG tablet Take 1 tablet (100 mg total) by mouth every 8 (eight) hours. 02/16/24  Yes Monge, Damien BROCKS, NP  insulin  aspart (NOVOLOG  FLEXPEN) 100 UNIT/ML FlexPen Inject 10-14 Units into the skin 3 (three) times daily before meals. 03/05/24  Yes Trixie File, MD  insulin  degludec (TRESIBA  FLEXTOUCH) 200 UNIT/ML FlexTouch Pen Inject 40 Units into the skin daily. 01/10/24  Yes Arlon Carliss ORN, DO  isosorbide  mononitrate (IMDUR ) 60 MG 24 hr tablet Take 1 tablet (60 mg total) by mouth daily. 01/11/24  Yes Arlon Carliss ORN, DO  metoprolol  tartrate (LOPRESSOR ) 25 MG tablet Take 1 tablet (25 mg total) by mouth 2 (two) times daily. 02/07/24 05/07/24 Yes Monge, Damien BROCKS, NP  Omega-3 Fatty Acids (FISH OIL) 1000 MG CAPS Take 1,000 mg by mouth in the morning and at bedtime. 11/21/21  Yes Bethanie Cough, PA-C  Potassium Chloride  ER 20 MEQ TBCR Take 2 tablets (40 mEq total) by mouth every other day. 03/22/24  Yes Raenelle Coria, MD  rOPINIRole  (REQUIP ) 0.25 MG tablet Take 1 tablet (0.25 mg total) by mouth at bedtime. Take 0.25 mg by mouth at bedtime. 02/27/24   Yes Paseda, Folashade R, FNP  rosuvastatin  (CRESTOR ) 10 MG tablet Take 1 tablet (10 mg total) by mouth daily. 03/20/24 06/18/24 Yes West, Katlyn D, NP     Critical care time: NA

## 2024-04-30 ENCOUNTER — Inpatient Hospital Stay (HOSPITAL_COMMUNITY)

## 2024-04-30 DIAGNOSIS — R0603 Acute respiratory distress: Secondary | ICD-10-CM

## 2024-04-30 DIAGNOSIS — J441 Chronic obstructive pulmonary disease with (acute) exacerbation: Secondary | ICD-10-CM | POA: Diagnosis not present

## 2024-04-30 DIAGNOSIS — K449 Diaphragmatic hernia without obstruction or gangrene: Secondary | ICD-10-CM | POA: Diagnosis not present

## 2024-04-30 DIAGNOSIS — K219 Gastro-esophageal reflux disease without esophagitis: Secondary | ICD-10-CM | POA: Diagnosis not present

## 2024-04-30 LAB — ECHOCARDIOGRAM COMPLETE
Area-P 1/2: 2.8 cm2
Calc EF: 73 %
Height: 66 in
S' Lateral: 2.7 cm
Single Plane A2C EF: 74.3 %
Single Plane A4C EF: 73.7 %
Weight: 3099.2 [oz_av]

## 2024-04-30 LAB — BLOOD GAS, ARTERIAL
Acid-Base Excess: 0.8 mmol/L (ref 0.0–2.0)
Bicarbonate: 25.3 mmol/L (ref 20.0–28.0)
O2 Saturation: 99.9 %
Patient temperature: 36.9
pCO2 arterial: 39 mmHg (ref 32–48)
pH, Arterial: 7.42 (ref 7.35–7.45)
pO2, Arterial: 184 mmHg — ABNORMAL HIGH (ref 83–108)

## 2024-04-30 LAB — BASIC METABOLIC PANEL WITH GFR
Anion gap: 10 (ref 5–15)
BUN: 78 mg/dL — ABNORMAL HIGH (ref 8–23)
CO2: 26 mmol/L (ref 22–32)
Calcium: 9.8 mg/dL (ref 8.9–10.3)
Chloride: 98 mmol/L (ref 98–111)
Creatinine, Ser: 5.98 mg/dL — ABNORMAL HIGH (ref 0.44–1.00)
GFR, Estimated: 7 mL/min — ABNORMAL LOW (ref >60)
Glucose, Bld: 399 mg/dL — ABNORMAL HIGH (ref 70–99)
Potassium: 4.5 mmol/L (ref 3.5–5.1)
Sodium: 134 mmol/L — ABNORMAL LOW (ref 135–145)

## 2024-04-30 LAB — GLUCOSE, CAPILLARY
Glucose-Capillary: 355 mg/dL — ABNORMAL HIGH (ref 70–99)
Glucose-Capillary: 301 mg/dL — ABNORMAL HIGH (ref 70–99)
Glucose-Capillary: 284 mg/dL — ABNORMAL HIGH (ref 70–99)
Glucose-Capillary: 350 mg/dL — ABNORMAL HIGH (ref 70–99)

## 2024-04-30 LAB — CBC
HCT: 29.9 % — ABNORMAL LOW (ref 36.0–46.0)
Hemoglobin: 9.8 g/dL — ABNORMAL LOW (ref 12.0–15.0)
MCH: 32.7 pg (ref 26.0–34.0)
MCHC: 32.8 g/dL (ref 30.0–36.0)
MCV: 99.7 fL (ref 80.0–100.0)
Platelets: 223 K/uL (ref 150–400)
RBC: 3 MIL/uL — ABNORMAL LOW (ref 3.87–5.11)
RDW: 13 % (ref 11.5–15.5)
WBC: 13.4 K/uL — ABNORMAL HIGH (ref 4.0–10.5)
nRBC: 0.2 % (ref 0.0–0.2)

## 2024-04-30 MED ORDER — INSULIN GLARGINE-YFGN 100 UNIT/ML ~~LOC~~ SOLN
26.0000 [IU] | Freq: Every day | SUBCUTANEOUS | Status: DC
  Administered 2024-05-01 – 2024-05-02 (×4): 26 [IU] via SUBCUTANEOUS
  Filled 2024-04-30 (×2): qty 0.26

## 2024-04-30 MED ORDER — INSULIN ASPART 100 UNIT/ML IJ SOLN
4.0000 [IU] | Freq: Three times a day (TID) | INTRAMUSCULAR | Status: DC
  Administered 2024-04-30 – 2024-05-02 (×14): 4 [IU] via SUBCUTANEOUS

## 2024-04-30 MED ORDER — INSULIN GLARGINE-YFGN 100 UNIT/ML ~~LOC~~ SOLN
10.0000 [IU] | Freq: Once | SUBCUTANEOUS | Status: AC
  Administered 2024-04-30 (×2): 10 [IU] via SUBCUTANEOUS
  Filled 2024-04-30: qty 0.1

## 2024-04-30 MED ORDER — PANTOPRAZOLE SODIUM 40 MG PO TBEC
40.0000 mg | DELAYED_RELEASE_TABLET | Freq: Two times a day (BID) | ORAL | Status: DC
  Administered 2024-04-30 – 2024-05-02 (×8): 40 mg via ORAL
  Filled 2024-04-30 (×4): qty 1

## 2024-04-30 MED ORDER — SODIUM CHLORIDE 0.9 % IV SOLN: 510.0000 mg | INTRAVENOUS | Status: DC

## 2024-04-30 NOTE — Progress Notes (Signed)
 [facility] Progress Note   Assessment/ Plan:   Assessment/ Plan: AKI on CKD 5: b/l creat 4.6 from may 2025, eGFR  from April 2025, eGFR 13-16 ml/min. Creat here was 5.8 on admission. Her main c/o is SOB and orthopnea. She has been taking her po lasix  80 bid at home, no recent changes in dosing. BP's are stable, on multiple BP meds. Imaging is not showing pulm edema, have consulted to help with the cause of her sig SOB/ orthopnea, appreciate their assistance. She is getting her home dose lasix  80mg  bid, and a renal diet (changed FR to 1200cc). Creat 5.8 could be AKI vs a new baseline. She is not grossly uremic.  - continue diuresis  - no indication for RRT for now SOB/ orthopnea: as above, consulting pulm team due to insignificant help from imaging.  Anemia ckd: Hb 9.5, follow.  HTN: per the patient has been hard to control for over a year. Not bad right now at 165/41 range. Cont home meds.  H/o CVA PAD DM2 on insulin   Subjective:    Getting TTE today.  No complaints today   Objective:   BP (!) 155/49   Pulse (!) 58   Temp 97.8 F (36.6 C) (Axillary)   Resp 19   Ht 5' 6 (1.676 m)   Wt 87.9 kg   SpO2 99%   BMI 31.26 kg/m   Physical Exam: Gen:NAD CVS: regular Resp: unlabored Ext: no LE edema ACCESS AVF   Labs: BMET Recent Labs  Lab 04/28/24 0013 04/29/24 0333 04/30/24 0439  NA 138 136 134*  K 3.4* 4.1 4.5  CL 100 100 98  CO2 27 25 26   GLUCOSE 97 336* 399*  BUN 47* 63* 78*  CREATININE 5.81* 6.07* 5.98*  CALCIUM  10.5* 9.9 9.8  PHOS  --  5.8*  --    CBC Recent Labs  Lab 04/28/24 0013 04/29/24 0333 04/30/24 0439  WBC 10.3 13.6* 13.4*  HGB 10.4* 9.5* 9.8*  HCT 32.8* 29.5* 29.9*  MCV 101.5* 100.0 99.7  PLT 254 222 223      Medications:     amLODipine   10 mg Oral Daily   arformoterol   15 mcg Nebulization BID   aspirin  EC  81 mg Oral Daily   budesonide  (PULMICORT ) nebulizer solution  0.25 mg Nebulization BID   busPIRone   5 mg Oral BID    calcitRIOL   0.5 mcg Oral q AM   cloNIDine   0.2 mg Oral QHS   doxycycline   100 mg Oral Q12H   furosemide   80 mg Intravenous Q12H   heparin  injection (subcutaneous)  5,000 Units Subcutaneous Q8H   hydrALAZINE   100 mg Oral Q8H   insulin  aspart  0-20 Units Subcutaneous TID WC   insulin  aspart  0-5 Units Subcutaneous QHS   insulin  aspart  4 Units Subcutaneous TID WC   [START ON 05/01/2024] insulin  glargine-yfgn  26 Units Subcutaneous Daily   isosorbide  mononitrate  60 mg Oral Daily   metoprolol  tartrate  25 mg Oral BID   pantoprazole   40 mg Oral BID   predniSONE   40 mg Oral QAC breakfast   revefenacin   175 mcg Nebulization Daily   rOPINIRole   0.25 mg Oral QHS   rosuvastatin   10 mg Oral Daily     Almarie Bonine, MD 04/30/2024, 3:21 PM

## 2024-04-30 NOTE — Plan of Care (Signed)
  Problem: Education: Goal: Knowledge of General Education information will improve Description: Including pain rating scale, medication(s)/side effects and non-pharmacologic comfort measures Outcome: Progressing   Problem: Health Behavior/Discharge Planning: Goal: Ability to manage health-related needs will improve Outcome: Progressing   Problem: Clinical Measurements: Goal: Ability to maintain clinical measurements within normal limits will improve Outcome: Progressing Goal: Will remain free from infection Outcome: Progressing Goal: Diagnostic test results will improve Outcome: Progressing Goal: Respiratory complications will improve Outcome: Progressing Goal: Cardiovascular complication will be avoided Outcome: Progressing   Problem: Nutrition: Goal: Adequate nutrition will be maintained Outcome: Progressing   Problem: Elimination: Goal: Will not experience complications related to bowel motility Outcome: Progressing Goal: Will not experience complications related to urinary retention Outcome: Progressing   Problem: Pain Managment: Goal: General experience of comfort will improve and/or be controlled Outcome: Progressing   Problem: Safety: Goal: Ability to remain free from injury will improve Outcome: Progressing   Problem: Skin Integrity: Goal: Risk for impaired skin integrity will decrease Outcome: Progressing   Problem: Education: Goal: Ability to describe self-care measures that may prevent or decrease complications (Diabetes Survival Skills Education) will improve Outcome: Progressing Goal: Individualized Educational Video(s) Outcome: Progressing   Problem: Coping: Goal: Ability to adjust to condition or change in health will improve Outcome: Progressing   Problem: Health Behavior/Discharge Planning: Goal: Ability to identify and utilize available resources and services will improve Outcome: Progressing Goal: Ability to manage health-related needs will  improve Outcome: Progressing   Problem: Nutritional: Goal: Maintenance of adequate nutrition will improve Outcome: Progressing   Problem: Skin Integrity: Goal: Risk for impaired skin integrity will decrease Outcome: Progressing   Problem: Tissue Perfusion: Goal: Adequacy of tissue perfusion will improve Outcome: Progressing   Problem: Activity: Goal: Risk for activity intolerance will decrease Outcome: Not Progressing   Problem: Coping: Goal: Level of anxiety will decrease Outcome: Not Progressing   Problem: Fluid Volume: Goal: Ability to maintain a balanced intake and output will improve Outcome: Not Progressing   Problem: Metabolic: Goal: Ability to maintain appropriate glucose levels will improve Outcome: Not Progressing   Problem: Nutritional: Goal: Progress toward achieving an optimal weight will improve Outcome: Not Progressing

## 2024-04-30 NOTE — Progress Notes (Signed)
  Echocardiogram 2D Echocardiogram has been performed.  [name] 04/30/2024, 2:30 PM

## 2024-04-30 NOTE — Progress Notes (Signed)
 PROGRESS NOTE  [name] FMW:[id number] DOB: 1946/06/09 DOA: 04/27/2024 PCP: Lenon Nell SAILOR, FNP   LOS: 2 days   Brief Narrative: 78 y.o. female with medical history significant of coronary artery disease, CVA, carotid stenosis, history of brain aneurysm, history of CVA, diabetes non-insulin -dependent type II, chronic kidney disease stage V, COPD, pulmonary nodules, tobacco abuse, anxiety disorder, aortic atherosclerosis, migraine headaches, essential hypertension, hyperlipidemia and peripheral vascular disease who presents with acute on chronic shortness of breath.  Admitted for acute hypoxemic respiratory failure deemed secondary to COPD exacerbation  Subjective / 24h Interval events: Complains of persistent shortness of breath this morning.  No chest pain, no abdominal discomfort, no nausea or vomiting  Assesement and Plan: Principal problem Acute hypoxic respiratory failure due to COPD exacerbation -pulmonary consulted and following, appreciate input.  Continue nebulizers, antibiotics, started on steroids as well.  Remains with wheezing today - VQ scan with normal perfusion, left lower extremity Dopplers negative for DVTs.  Continues treatment and supportive care  Active problems CKD stage V-nephrology following, making urine, continue furosemide .  No indication for dialysis per nephrology  Macrocytic anemia-no bleeding, overall hemoglobin is stable  Right SFA occlusion - noted incidentally on doppler-->known occlusion, follows with vascular surgery.   - Known PAD and known hx/o of attempted left superficial femoral artery stenting in 2012 with early ISR, s/p balloon angioplasty which ultimately failed.  - Underwent left femoral to below-knee popliteal artery bypass graft with vein in March 2023. She developed distal anastomotic stenosis which required intervention on 03/09/2022. She underwent drug-coated balloon angioplasty to left femoral to popliteal bypass graft in June 2024.    - if has LE calf pain, would re-consult vascular surgery for re-assessment due to her extensive disease.     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  This was seen incidentally on Dopplers   Elevated troponin - Chronic elevation.  No chest pain, no evidence of ischemia or ACS   Diabetes mellitus type 2 with steroid-induced hyperglycemia-continue short and long-acting.  Add mealtime insulin    Hypokalemia -monitor and replace as indicated  Scheduled Meds:  amLODipine   10 mg Oral Daily   arformoterol   15 mcg Nebulization BID   aspirin  EC  81 mg Oral Daily   budesonide  (PULMICORT ) nebulizer solution  0.25 mg Nebulization BID   busPIRone   5 mg Oral BID   calcitRIOL   0.5 mcg Oral q AM   cloNIDine   0.2 mg Oral QHS   doxycycline   100 mg Oral Q12H   furosemide   80 mg Intravenous Q12H   heparin  injection (subcutaneous)  5,000 Units Subcutaneous Q8H   hydrALAZINE   100 mg Oral Q8H   insulin  aspart  0-20 Units Subcutaneous TID WC   insulin  aspart  0-5 Units Subcutaneous QHS   insulin  aspart  4 Units Subcutaneous TID WC   [START ON 05/01/2024] insulin  glargine-yfgn  26 Units Subcutaneous Daily   isosorbide  mononitrate  60 mg Oral Daily   metoprolol  tartrate  25 mg Oral BID   pantoprazole   40 mg Oral BID   predniSONE   40 mg Oral QAC breakfast   revefenacin   175 mcg Nebulization Daily   rOPINIRole   0.25 mg Oral QHS   rosuvastatin   10 mg Oral Daily   Continuous Infusions: PRN Meds:.acetaminophen , ipratropium-albuterol   Current Outpatient Medications  Medication Instructions   acetaminophen  (TYLENOL ) 1,000 mg, Oral, 2 times daily   amLODipine  (NORVASC ) 10 mg, Oral, Daily   aspirin  EC 81 mg, Oral, Daily, Swallow whole.   busPIRone  (BUSPAR )  5 mg, Oral, 2 times daily   calcitRIOL  (ROCALTROL ) 0.5 mcg, Oral, Every morning   cloNIDine  (CATAPRES ) 0.1 mg, Oral, Daily at bedtime   Fish Oil 1,000 mg, Oral, 2 times daily   Fluticasone -Umeclidin-Vilant (TRELEGY ELLIPTA ) 200-62.5-25 MCG/ACT AEPB 1  puff, Inhalation, Daily   furosemide  (LASIX ) 80 mg, Oral, 2 times daily   hydrALAZINE  (APRESOLINE ) 100 mg, Oral, Every 8 hours   Insulin  Degludec FlexTouch 40 Units, Subcutaneous, Daily   isosorbide  mononitrate (IMDUR ) 60 mg, Oral, Daily   metoprolol  tartrate (LOPRESSOR ) 25 mg, Oral, 2 times daily   NovoLOG  FlexPen 10-14 Units, Subcutaneous, 3 times daily before meals   Potassium Chloride  ER 20 MEQ TBCR 40 mEq, Oral, Every other day   rOPINIRole  (REQUIP ) 0.25 mg, Oral, Daily at bedtime, Take 0.25 mg by mouth at bedtime.   rosuvastatin  (CRESTOR ) 10 mg, Oral, Daily    Diet Orders (From admission, onward)     Start     Ordered   04/29/24 1726  Diet renal with fluid restriction Fluid restriction: 1200 mL Fluid; Room service appropriate? Yes; Fluid consistency: Thin  Diet effective now       Question Answer Comment  Fluid restriction: 1200 mL Fluid   Room service appropriate? Yes   Fluid consistency: Thin      04/29/24 1725            DVT prophylaxis: heparin  injection 5,000 Units Start: 04/29/24 1730 Place TED hose Start: 04/28/24 1308   Lab Results  Component Value Date   PLT 223 04/30/2024      Code Status: Full Code  Family Communication: n ofamily at bedside   Status is: Inpatient Remains inpatient appropriate because: Severity of illness   Level of care: Telemetry Medical  Consultants:  Nephrology Pulmonary  Objective: Vitals:   04/30/24 0807 04/30/24 0838 04/30/24 0857 04/30/24 0859  BP:  (!) 157/37 (!) 157/37 (!) 157/37  Pulse:  (!) 53  (!) 53  Resp:  17    Temp:  97.8 F (36.6 C)    TempSrc:  Oral    SpO2: 97% 100%    Weight:      Height:        Intake/Output Summary (Last 24 hours) at 04/30/2024 9047 Last data filed at 04/30/2024 0400 Gross per 24 hour  Intake 480 ml  Output 2 ml  Net 478 ml   Wt Readings from Last 3 Encounters:  04/30/24 87.9 kg  04/09/24 81.2 kg  03/21/24 80.3 kg    Examination:  Constitutional: NAD Eyes: no  scleral icterus ENMT: Mucous membranes are moist.  Neck: normal, supple Respiratory: Bilateral expiratory wheezing heard, no crackles Cardiovascular: Regular rate and rhythm, no murmurs / rubs / gallops. No LE edema.  Abdomen: non distended, no tenderness. Bowel sounds positive.  Musculoskeletal: no clubbing / cyanosis.    Data Reviewed: I have independently reviewed following labs and imaging studies   CBC Recent Labs  Lab 04/28/24 0013 04/29/24 0333 04/30/24 0439  WBC 10.3 13.6* 13.4*  HGB 10.4* 9.5* 9.8*  HCT 32.8* 29.5* 29.9*  PLT 254 222 223  MCV 101.5* 100.0 99.7  MCH 32.2 32.2 32.7  MCHC 31.7 32.2 32.8  RDW 12.9 13.0 13.0    Recent Labs  Lab 04/27/24 0013 04/28/24 0013 04/29/24 0333 04/30/24 0439  NA  --  138 136 134*  K  --  3.4* 4.1 4.5  CL  --  100 100 98  CO2  --  27 25 26   GLUCOSE  --  97 336* 399*  BUN  --  47* 63* 78*  CREATININE  --  5.81* 6.07* 5.98*  CALCIUM   --  10.5* 9.9 9.8  AST  --   --  23  --   ALT  --   --  14  --   ALKPHOS  --   --  64  --   BILITOT  --   --  0.6  --   ALBUMIN   --   --  2.4*  --   MG  --   --  2.5*  --   DDIMER  --  0.77*  --   --   BNP 757.7*  --   --   --     ------------------------------------------------------------------------------------------------------------------ No results for input(s): CHOL, HDL, LDLCALC, TRIG, CHOLHDL, LDLDIRECT in the last 72 hours.  Lab Results  Component Value Date   HGBA1C 8.2 (A) 03/05/2024   ------------------------------------------------------------------------------------------------------------------ No results for input(s): TSH, T4TOTAL, T3FREE, THYROIDAB in the last 72 hours.  Invalid input(s): FREET3  Cardiac Enzymes No results for input(s): CKMB, TROPONINI, MYOGLOBIN in the last 168 hours.  Invalid input(s): CK ------------------------------------------------------------------------------------------------------------------     Component Value Date/Time   BNP 757.7 (H) 04/27/2024 0013    CBG: Recent Labs  Lab 04/29/24 0616 04/29/24 1131 04/29/24 1648 04/29/24 2105 04/30/24 0631  GLUCAP 271* 296* 420* 337* 355*    No results found for this or any previous visit (from the past 240 hours).   Radiology Studies: VAS US  LOWER EXTREMITY VENOUS (DVT) Result Date: 04/29/2024  Lower Venous DVT Study Patient Name:  [name]  Date of Exam:   04/29/2024 Medical Rec #: [id number]       Accession #:    [id number] Date of Birth: 06-06-1946        Patient Gender: F Patient Age:   85 years Exam Location:  [facility] Procedure:      VAS US  LOWER EXTREMITY VENOUS (DVT) Referring Phys: JEFFREY WALDEN --------------------------------------------------------------------------------  Indications: Swelling, and Edema.  Risk Factors: Pt has known PAD. Performing Technologist: Elmarie Lindau, RVT  Examination Guidelines: A complete evaluation includes B-mode imaging, spectral Doppler, color Doppler, and power Doppler as needed of all accessible portions of each vessel. Bilateral testing is considered an integral part of a complete examination. Limited examinations for reoccurring indications may be performed as noted. The reflux portion of the exam is performed with the patient in reverse Trendelenburg.  +---------+---------------+---------+-----------+----------+--------------+ RIGHT    CompressibilityPhasicitySpontaneityPropertiesThrombus Aging +---------+---------------+---------+-----------+----------+--------------+ CFV      Full           Yes      Yes                                 +---------+---------------+---------+-----------+----------+--------------+ SFJ      Full                                                        +---------+---------------+---------+-----------+----------+--------------+ FV Prox  Full                                                         +---------+---------------+---------+-----------+----------+--------------+  FV Mid   Full                                                        +---------+---------------+---------+-----------+----------+--------------+ FV DistalFull                                                        +---------+---------------+---------+-----------+----------+--------------+ PFV      Full                                                        +---------+---------------+---------+-----------+----------+--------------+ POP      Full           Yes      Yes                                 +---------+---------------+---------+-----------+----------+--------------+ PTV      Full                                                        +---------+---------------+---------+-----------+----------+--------------+ PERO     Full                                                        +---------+---------------+---------+-----------+----------+--------------+ There is a heterogeneous, fluid filled appearing, nonvascularized area in the right popliteal space measuring 4.43 x 1.09 cm at its maximum diameter.  +---------+---------------+---------+-----------+----------+--------------+ LEFT     CompressibilityPhasicitySpontaneityPropertiesThrombus Aging +---------+---------------+---------+-----------+----------+--------------+ CFV      Full           Yes      Yes                                 +---------+---------------+---------+-----------+----------+--------------+ SFJ      Full                                                        +---------+---------------+---------+-----------+----------+--------------+ FV Prox  Full                                                        +---------+---------------+---------+-----------+----------+--------------+ FV Mid   Full                                                         +---------+---------------+---------+-----------+----------+--------------+  FV DistalFull                                                        +---------+---------------+---------+-----------+----------+--------------+ PFV      Full                                                        +---------+---------------+---------+-----------+----------+--------------+ POP      Full           Yes      Yes                                 +---------+---------------+---------+-----------+----------+--------------+ PTV      Full                                                        +---------+---------------+---------+-----------+----------+--------------+ PERO     Full                                                        +---------+---------------+---------+-----------+----------+--------------+ Incidental finding: There is a possible near occlusion vs occlusion in the right SFA.    Summary: RIGHT: - There is no evidence of deep vein thrombosis in the lower extremity.  - A cystic structure is found in the popliteal fossa. - There is a heterogeneous, fluid filled appearing, nonvascularized area in the right popliteal space measuring 4.43 x 1.09 cm at its maximum diameter. There is an incidental finding of a near occluded versus an occluded right superficial femoral artery.  LEFT: - There is no evidence of deep vein thrombosis in the lower extremity.  - No cystic structure found in the popliteal fossa.  *See table(s) above for measurements and observations. Electronically signed by Norman Serve on 04/29/2024 at 4:09:40 PM.    Final    NM Pulmonary Perfusion Result Date: 04/29/2024 CLINICAL DATA:  High probability for pulmonary embolism. Shortness of breath. EXAM: NUCLEAR MEDICINE PERFUSION LUNG SCAN TECHNIQUE: Perfusion images were obtained in multiple projections after intravenous injection of radiopharmaceutical. Ventilation scans intentionally deferred if perfusion scan and chest  x-ray adequate for interpretation during COVID 19 epidemic. RADIOPHARMACEUTICALS:  4.3 mCi Tc-64m MAA IV COMPARISON:  CT chest and chest x-ray 04/28/2024. FINDINGS: There is normal homogeneous perfusion throughout both lungs. No perfusion defects are identified. IMPRESSION: Normal examination. Electronically Signed   By: Greig Pique M.D.   On: 04/29/2024 15:56     Nilda Fendt, MD, PhD [facility]  Between 7 am - 7 pm I am available, please contact me via Amion (for emergencies) or Securechat (non urgent messages)  Between 7 pm - 7 am I am not available, please contact night coverage MD/APP via Amion

## 2024-04-30 NOTE — Inpatient Diabetes Management (Signed)
 Inpatient Diabetes Program Recommendations  AACE/ADA: New Consensus Statement on Inpatient Glycemic Control (2015)  Target Ranges:  Prepandial:   less than 140 mg/dL      Peak postprandial:   less than 180 mg/dL (1-2 hours)      Critically ill patients:  140 - 180 mg/dL   Lab Results  Component Value Date   GLUCAP 355 (H) 04/30/2024   HGBA1C 8.2 (A) 03/05/2024    Review of Glycemic Control  Latest Reference Range & Units 04/28/24 16:02 04/28/24 21:01 04/29/24 06:16 04/29/24 11:31 04/29/24 16:48 04/29/24 21:05 04/30/24 06:31  Glucose-Capillary 70 - 99 mg/dL 488 (HH) 512 (H) 728 (H) 296 (H) 420 (H) 337 (H) 355 (H)  (HH): Data is critically high (H): Data is abnormally high  Diabetes history: DM2 Outpatient Diabetes medications: Tresiba  40 units at bedtime, Novolog  10-14 units TID Current orders for Inpatient glycemic control: Semglee  26 units every day, Novolog  0-15 units TID and 0-5 units at bedtime, Novolog  4 units MCTID and prednisone  40 mg QD  Inpatient Diabetes Program Recommendations:    Might consider:  Semglee  40 units every day (start on 8/12) Novolog  8 units TID MC  Thank you, Wyvonna Pinal, MSN, CDCES Diabetes Coordinator Inpatient Diabetes Program [phone] (team pager from 8a-5p)

## 2024-04-30 NOTE — Progress Notes (Signed)
 NAME:  [name], MRN:  [id number], DOB:  02-09-1946, LOS: 2 ADMISSION DATE:  04/27/2024, CONSULTATION DATE:  04/29/24 REFERRING MD:  Dr Susannah, CHIEF COMPLAINT:  SOB   History of Present Illness:   Expand All Collapse All     NAME:  [name], MRN:  [id number], DOB:  04-20-1946, LOS: 1 ADMISSION DATE:  04/27/2024, CONSULTATION DATE:  8/10 REFERRING MD:  Geralynn , CHIEF COMPLAINT:  inc SOB    History of Present Illness:  78 year old female patient with a history of severe chronic struct of pulmonary disease with a FEV1 of 1.21 which is 50% predicted back in 2020.  She is followed by our clinic and was last seen in March 2025 at which time continued to report baseline exertional dyspnea.  She also has a history of a lung nodule which has been stable and being followed by imaging. She was admitted on 8/9 with chief complaint of shortness of breath.  Noted it to be increasing over approximately 3 days.  Prior to arrival.  Denied any URI symptoms.  No sick exposures.  Did endorse increased swelling lower extremities.  Left was greater than right both counts were painful she denied chest pain.  Denied any nausea or vomiting.  Did report decreased appetite due to shortness of breath.  She also had a fall at home. In the ER Diagnostic evaluation included the following: Portable chest x-ray this was essentially clear, there was known right lung basilar granuloma but no edema or infiltrate.  A noncontrast CT of chest was subsequently obtained showed some basilar bronchiectasis, scattered calcified 9 calcified nodules throughout both lungs but otherwise no infiltrates.  Chronic emphysematous changes were noted.   She was admitted for a working diagnosis of acute exacerbation exacerbation of her COPD  In ER she was not hypoxic but did report symptomatic improvement with supplemental oxygen.  Initial blood pressure systolically in the 200s which was treated with sublingual nitroglycerin . Therapeutic  interventions have included the following: Supplemental oxygen, empiric doxycycline , scheduled bronchodilators, and oral systemic steroids. She has continued to have fairly significant shortness of breath over the course of her hospitalization.  On physical exam apparently her wheezing had improved in comparison to the day of admission.  She had a VQ scan on the 10th which is day of pulmonary consult and this was negative for pulmonary emboli.  She had a left lower extremity ultrasound and this was negative for clot.  She has been seen by nephrology in regards to her underlying CKD stage V, but because of ongoing symptoms, and recurrent exacerbations with unclear triggers pulmonary has been asked to evaluate.      Pertinent  Medical History  Ckd 5, CAD, carotid stenosis, prior CVA, history of brain aneurysm, non-insulin -dependent type 2 diabetes, pulmonary nodule, tobacco abuse, copd,  known history of esophageal stricture arthritis GERD hiatal hernia anxiety disorder, aortic arthrosclerosis, migraine headaches, hypertension, PVD. Pulmonary history, Last seen by Dr. Theophilus in March 2025 at which time still endorsed exertional dyspnea.  At that point reported that she felt cool as a cucumber at rest but would get quite short of breath with any exertion.  She had been using Spiriva  however this has been stopped due to throat soreness Was still actively smoking back in March 2025, had a 16-year pack history. In regards to her lung nodule that was first identified in 2022 this is remained stable She has been on Trelegy inhaler for her COPD PFTs on record FEV1 is  1.21 (50%) he FVC was 64% at 2.03 DL CO 84.6; Impression severe obstructive airway disease with minimal diffusion defect, this was back in 2020 follow-ups had been ordered but not completed. Last echocardiogram was In April 2025 at which point her EF was estimated at 60 to 65% there was no regional wall abnormality there was mild left ventricular  hypertrophy there was grade 1 diastolic dysfunction RV size and function was normal there was trivial mitral regurg there was no evidence of aortic stenosis  Significant Hospital Events: Including procedures, antibiotic start and stop dates in addition to other pertinent events   8/9 admitted with COPD exacerbation started on antibiotics bronchodilators systemic steroids CT chest negative for infiltrate stable calcified nodules which were small and not related to presenting symptoms.  Chest x-ray clear 8/10 VQ scan low probability of PE Ultrasound of bilateral lower extremities, was found to have incidental near occlusion of the right superficial femoral artery but no DVT.  And there was no DVT in the left lower extremity.  Interim History / Subjective:  Awake alert interactive Denies any significant symptoms at present Breathing feels a little bit better  Objective    Blood pressure (!) 157/37, pulse (!) 53, temperature 97.8 F (36.6 C), temperature source Oral, resp. rate 17, height 5' 6 (1.676 m), weight 87.9 kg, SpO2 100%.    FiO2 (%):  [36 %] 36 %   Intake/Output Summary (Last 24 hours) at 04/30/2024 1100 Last data filed at 04/30/2024 0400 Gross per 24 hour  Intake 480 ml  Output 2 ml  Net 478 ml   Filed Weights   04/27/24 2359 04/30/24 0643  Weight: 81.2 kg 87.9 kg    Examination: General: Elderly, does not appear to be in distress HENT: Moist oral mucosa Lungs: Decreased air movement bilaterally, no rales Cardiovascular: S1-S2 appreciated, systolic murmur Abdomen: Soft, bowel sounds appreciated Extremities: Lower extremity edema Neuro: Awake alert oriented GU:   Resolved problem list   Assessment and Plan   Acute exacerbation of chronic obstructive pulmonary disease Severe COPD at baseline - Last PFT did show severe obstruction GERD Hiatal hernia Esophageal stricture - Further evaluation being done today  History of diastolic heart failure Stage V chronic  kidney disease  Continue bronchodilators  Continue oxygen supplementation - Will need evaluated for possible O2 needs  Echocardiogram pending

## 2024-05-01 ENCOUNTER — Inpatient Hospital Stay (HOSPITAL_COMMUNITY)

## 2024-05-01 DIAGNOSIS — J9601 Acute respiratory failure with hypoxia: Secondary | ICD-10-CM | POA: Diagnosis not present

## 2024-05-01 DIAGNOSIS — J441 Chronic obstructive pulmonary disease with (acute) exacerbation: Secondary | ICD-10-CM | POA: Diagnosis not present

## 2024-05-01 DIAGNOSIS — K219 Gastro-esophageal reflux disease without esophagitis: Secondary | ICD-10-CM | POA: Diagnosis not present

## 2024-05-01 DIAGNOSIS — N184 Chronic kidney disease, stage 4 (severe): Secondary | ICD-10-CM | POA: Diagnosis not present

## 2024-05-01 LAB — COMPREHENSIVE METABOLIC PANEL WITH GFR
ALT: 18 U/L (ref 0–44)
AST: 23 U/L (ref 15–41)
Albumin: 2.4 g/dL — ABNORMAL LOW (ref 3.5–5.0)
Alkaline Phosphatase: 63 U/L (ref 38–126)
Anion gap: 9 (ref 5–15)
BUN: 88 mg/dL — ABNORMAL HIGH (ref 8–23)
CO2: 27 mmol/L (ref 22–32)
Calcium: 9.5 mg/dL (ref 8.9–10.3)
Chloride: 101 mmol/L (ref 98–111)
Creatinine, Ser: 6.01 mg/dL — ABNORMAL HIGH (ref 0.44–1.00)
GFR, Estimated: 7 mL/min — ABNORMAL LOW (ref >60)
Glucose, Bld: 260 mg/dL — ABNORMAL HIGH (ref 70–99)
Potassium: 4.2 mmol/L (ref 3.5–5.1)
Sodium: 137 mmol/L (ref 135–145)
Total Bilirubin: 0.6 mg/dL (ref 0.0–1.2)
Total Protein: 5.7 g/dL — ABNORMAL LOW (ref 6.5–8.1)

## 2024-05-01 LAB — CBC
HCT: 29.9 % — ABNORMAL LOW (ref 36.0–46.0)
Hemoglobin: 9.6 g/dL — ABNORMAL LOW (ref 12.0–15.0)
MCH: 32.2 pg (ref 26.0–34.0)
MCHC: 32.1 g/dL (ref 30.0–36.0)
MCV: 100.3 fL — ABNORMAL HIGH (ref 80.0–100.0)
Platelets: 215 K/uL (ref 150–400)
RBC: 2.98 MIL/uL — ABNORMAL LOW (ref 3.87–5.11)
RDW: 13 % (ref 11.5–15.5)
WBC: 13.3 K/uL — ABNORMAL HIGH (ref 4.0–10.5)
nRBC: 0.2 % (ref 0.0–0.2)

## 2024-05-01 LAB — GLUCOSE, CAPILLARY
Glucose-Capillary: 171 mg/dL — ABNORMAL HIGH (ref 70–99)
Glucose-Capillary: 316 mg/dL — ABNORMAL HIGH (ref 70–99)
Glucose-Capillary: 384 mg/dL — ABNORMAL HIGH (ref 70–99)
Glucose-Capillary: 454 mg/dL — ABNORMAL HIGH (ref 70–99)

## 2024-05-01 LAB — MAGNESIUM: Magnesium: 2.4 mg/dL (ref 1.7–2.4)

## 2024-05-01 LAB — PHOSPHORUS: Phosphorus: 5.5 mg/dL — ABNORMAL HIGH (ref 2.5–4.6)

## 2024-05-01 LAB — TROPONIN I (HIGH SENSITIVITY): Troponin I (High Sensitivity): 44 ng/L — ABNORMAL HIGH (ref <18)

## 2024-05-01 MED ORDER — CYCLOBENZAPRINE HCL 5 MG PO TABS
2.5000 mg | ORAL_TABLET | Freq: Once | ORAL | Status: DC
  Filled 2024-05-01: qty 0.5

## 2024-05-01 MED ORDER — NITROGLYCERIN 0.4 MG SL SUBL
0.4000 mg | SUBLINGUAL_TABLET | Freq: Once | SUBLINGUAL | Status: AC
  Administered 2024-05-01 (×2): 0.4 mg via SUBLINGUAL
  Filled 2024-05-01: qty 1

## 2024-05-01 MED ORDER — ONDANSETRON HCL 4 MG/2ML IJ SOLN
4.0000 mg | Freq: Once | INTRAMUSCULAR | Status: AC
  Administered 2024-05-01 (×2): 4 mg via INTRAVENOUS
  Filled 2024-05-01: qty 2

## 2024-05-01 MED ORDER — ASPIRIN 81 MG PO TBEC
81.0000 mg | DELAYED_RELEASE_TABLET | Freq: Once | ORAL | Status: AC
  Administered 2024-05-01 (×2): 81 mg via ORAL
  Filled 2024-05-01: qty 1

## 2024-05-01 NOTE — Progress Notes (Signed)
 Nurse requested Mobility Specialist to perform oxygen saturation test with pt which includes removing pt from oxygen both at rest and while ambulating.  Below are the results from that testing.     Patient Saturations on Room Air at Rest = spO2 96%  Patient Saturations on Room Air while Ambulating = sp02 93% .   At end of testing pt left in room on 0  Liters of oxygen.  Reported results to nurse.   Mirian Casco Mobility Specialist Please contact via Special educational needs teacher or  Rehab office at [phone]

## 2024-05-01 NOTE — Progress Notes (Signed)
 PROGRESS NOTE    [name]  FMW:[id number] DOB: 10-08-1945 DOA: 04/27/2024 PCP: Lenon Nell SAILOR, FNP    Brief Narrative:  78 y.o. female with medical history significant of coronary artery disease, CVA, carotid stenosis, history of brain aneurysm, history of CVA, diabetes non-insulin -dependent type II, chronic kidney disease stage V, COPD, pulmonary nodules, ongoing smoker, anxiety disorder, aortic atherosclerosis, migraine headaches, essential hypertension, hyperlipidemia and peripheral vascular disease who presented with acute on chronic shortness of breath.  Admitted for acute hypoxemic respiratory failure deemed secondary to COPD exacerbation  Also with advanced kidney disease, currently not requiring dialysis.  Subjective: Patient seen and examined.  Multiple complaints.  Ongoing shortness of breath especially with mobility and attempting to walk.  Right knee pain which is chronic.  Urine output is not measured.  Creatinine 6. Discussed with pulmonary at the bedside, patient with very advanced COPD.  Recommended optimizing nebulizer therapy, maintenance treatment and home.  Remains on IV Lasix .  Followed by nephrology. Patient was  wondering whether we can give her IV iron tomorrow.   Assessment & Plan:   Acute hypoxemic respiratory failure secondary to COPD exacerbation: Continue bronchodilator therapy, oral steroids, inhalational steroids, scheduled and as needed bronchodilators, deep breathing exercises, incentive spirometry, chest physiotherapy. Complete 7 days of antibiotic therapy.  Keep on oxygen to keep saturation more than 90%. Mobilize around and monitor for home oxygen need.  CKD stage V: Followed by nephrology.  Currently not recommending any dialysis.  No evidence of fluid overload or uremia.  Close monitoring recommended.  Continue high-dose Lasix .  Microcytic anemia: Due for IV iron tomorrow.  Will prescribe.  Right SFA occlusion: Stable.  On aspirin  and  statin.  Type 2 diabetes, steroid-induced hyperglycemia: On insulin .  Continue.  Smoker: Counseled to quit.  Right knee pain: X-ray shows tricompartmental osteoarthritis.  She may benefit with outpatient follow-up with orthopedics.  Will send referral to [facility] on discharge.      DVT prophylaxis: heparin  injection 5,000 Units Start: 04/29/24 1730 Place TED hose Start: 04/28/24 1308   Code Status: Full code Family Communication: None at the bedside Disposition Plan: Status is: Inpatient Remains inpatient appropriate because: Significant symptoms     Consultants:  Pulmonary Nephrology  Procedures:  None  Antimicrobials:  Doxycycline  8/10--     Objective: Vitals:   05/01/24 0712 05/01/24 0714 05/01/24 0824 05/01/24 1117  BP:   (!) 163/41 (!) 154/35  Pulse:   63 (!) 57  Resp:   18 20  Temp:   98.1 F (36.7 C) 97.6 F (36.4 C)  TempSrc:   Oral Oral  SpO2: 96% 96% 99% 96%  Weight:      Height:        Intake/Output Summary (Last 24 hours) at 05/01/2024 1343 Last data filed at 05/01/2024 0830 Gross per 24 hour  Intake 680 ml  Output 1150 ml  Net -470 ml   Filed Weights   04/27/24 2359 04/30/24 0643 05/01/24 0417  Weight: 81.2 kg 87.9 kg 88.9 kg    Examination:  General exam: Appears mildly anxious on exam. Respiratory system: On room air at rest.  Bilateral expiratory wheezes. Cardiovascular system: S1 & S2 heard, RRR. No JVD, murmurs, rubs, gallops or clicks. No pedal edema. Gastrointestinal system: Abdomen is nondistended, soft and nontender. No organomegaly or masses felt. Normal bowel sounds heard. Central nervous system: Alert and oriented. No focal neurological deficits. Extremities: Symmetric 5 x 5 power.  AV fistula present right arm. Right knee with no  obvious effusion, tender on range of motion.    Data Reviewed: I have personally reviewed following labs and imaging studies  CBC: Recent Labs  Lab 04/28/24 0013 04/29/24 0333  04/30/24 0439 05/01/24 0337  WBC 10.3 13.6* 13.4* 13.3*  HGB 10.4* 9.5* 9.8* 9.6*  HCT 32.8* 29.5* 29.9* 29.9*  MCV 101.5* 100.0 99.7 100.3*  PLT 254 222 223 215   Basic Metabolic Panel: Recent Labs  Lab 04/28/24 0013 04/29/24 0333 04/30/24 0439 05/01/24 0337  NA 138 136 134* 137  K 3.4* 4.1 4.5 4.2  CL 100 100 98 101  CO2 27 25 26 27   GLUCOSE 97 336* 399* 260*  BUN 47* 63* 78* 88*  CREATININE 5.81* 6.07* 5.98* 6.01*  CALCIUM  10.5* 9.9 9.8 9.5  MG  --  2.5*  --  2.4  PHOS  --  5.8*  --  5.5*   GFR: Estimated Creatinine Clearance: 8.8 mL/min (A) (by C-G formula based on SCr of 6.01 mg/dL (H)). Liver Function Tests: Recent Labs  Lab 04/29/24 0333 05/01/24 0337  AST 23 23  ALT 14 18  ALKPHOS 64 63  BILITOT 0.6 0.6  PROT 5.5* 5.7*  ALBUMIN  2.4* 2.4*   No results for input(s): LIPASE, AMYLASE in the last 168 hours. No results for input(s): AMMONIA in the last 168 hours. Coagulation Profile: No results for input(s): INR, PROTIME in the last 168 hours. Cardiac Enzymes: No results for input(s): CKTOTAL, CKMB, CKMBINDEX, TROPONINI in the last 168 hours. BNP (last 3 results) No results for input(s): PROBNP in the last 8760 hours. HbA1C: No results for input(s): HGBA1C in the last 72 hours. CBG: Recent Labs  Lab 04/30/24 1222 04/30/24 1615 04/30/24 2056 05/01/24 0611 05/01/24 1203  GLUCAP 301* 284* 350* 171* 316*   Lipid Profile: No results for input(s): CHOL, HDL, LDLCALC, TRIG, CHOLHDL, LDLDIRECT in the last 72 hours. Thyroid  Function Tests: No results for input(s): TSH, T4TOTAL, FREET4, T3FREE, THYROIDAB in the last 72 hours. Anemia Panel: No results for input(s): VITAMINB12, FOLATE, FERRITIN, TIBC, IRON, RETICCTPCT in the last 72 hours. Sepsis Labs: No results for input(s): PROCALCITON, LATICACIDVEN in the last 168 hours.  No results found for this or any previous visit (from the past 240  hours).       Radiology Studies: DG Knee Right Port Result Date: 05/01/2024 CLINICAL DATA:  Osteoarthritis. EXAM: PORTABLE RIGHT KNEE - 1-2 VIEW COMPARISON:  None Available. FINDINGS: Patellofemoral tibiofemoral joint space narrowing. Mild tricompartmental peripheral spurring. No fracture, erosion, or focal bone abnormality. No joint effusion. Generalized soft tissue edema. Arterial vascular calcifications are seen. IMPRESSION: Mild tricompartmental osteoarthritis. Electronically Signed   By: Andrea Gasman M.D.   On: 05/01/2024 12:09   DG ESOPHAGUS W SINGLE CM (SOL OR THIN BA) Result Date: 04/30/2024 CLINICAL DATA:  Patient with restrictive lung disease with concern for gastroesophageal reflux. Patient also was informed she had a hiatal hernia which she states she has had for years . EXAM: ESOPHAGUS/BARIUM SWALLOW/TABLET STUDY TECHNIQUE: Single contrast examination was performed using thin liquid barium. This exam was performed by Sari Lamp, PA-C, and was supervised and interpreted by Dr. Rockey Kilts. FLUOROSCOPY: Radiation Exposure Index (as provided by the fluoroscopic device): 19.6 mGy Kerma COMPARISON:  CT scan of the chest performed April 28, 2024 and CT scan of the abdomen pelvis performed October 14, 2023 FINDINGS: Esophagus: Normal appearance.  No mucosal ulcerations seen. Esophageal motility: No dysmotility identified. Hiatal Hernia: No hiatal hernia seen. Gastroesophageal reflux: No spontaneous gastroesophageal reflux seen  and no reflux with cough or Valsalva. Ingested 13mm barium tablet: Passed from mouth to stomach with a brief hesitation in the distal thoracic esophagus. Other: Extremely limited exam secondary to patient's restrictive lung disease and limited mobility. Exam performed only in an LPO position. IMPRESSION: Limited, focused exam performed secondary to patient immobility. No hiatal hernia or evidence of gastroesophageal reflux. Electronically Signed   By: Rockey Kilts M.D.    On: 04/30/2024 18:44   ECHOCARDIOGRAM COMPLETE Result Date: 04/30/2024    ECHOCARDIOGRAM REPORT   Patient Name:   [name] Date of Exam: 04/30/2024 Medical Rec #:  [id number]      Height:       66.0 in Accession #:    [id number]     Weight:       193.7 lb Date of Birth:  1945-10-01       BSA:          1.973 m Patient Age:    78 years       BP:           150/41 mmHg Patient Gender: F              HR:           60 bpm. Exam Location:  Inpatient Procedure: 2D Echo, Cardiac Doppler and Color Doppler (Both Spectral and Color            Flow Doppler were utilized during procedure). Indications:    R06.02 SOB. Acute respiratory distress.  History:        Patient has prior history of Echocardiogram examinations, most                 recent 01/04/2024. Abnormal ECG, COPD and Stroke,                 Signs/Symptoms:Shortness of Breath and Dyspnea; Risk                 Factors:Current Smoker, Diabetes and Hypertension.  Sonographer:    Ellouise Mose RDCS Referring Phys: [id number] PETER E BABCOCK  Sonographer Comments: Syngo delay IMPRESSIONS  1. Left ventricular ejection fraction, by estimation, is 60 to 65%. The left ventricle has normal function. The left ventricle has no regional wall motion abnormalities. There is moderate left ventricular hypertrophy. Left ventricular diastolic parameters are indeterminate.  2. Right ventricular systolic function is normal. The right ventricular size is normal. There is normal pulmonary artery systolic pressure. The estimated right ventricular systolic pressure is 35.9 mmHg.  3. The mitral valve is degenerative. Mild mitral valve regurgitation. No evidence of mitral stenosis.  4. Tricuspid valve regurgitation is moderate.  5. The aortic valve is tricuspid. Aortic valve regurgitation is not visualized. Aortic valve sclerosis is present, with no evidence of aortic valve stenosis.  6. Findings suggestive of small PFO (image 35). Comparison(s): A prior study was performed on 01/04/2024. Mild MR,  RVSP 36 mmHG, and small PFO are new findings compared to prior study. FINDINGS  Left Ventricle: Left ventricular ejection fraction, by estimation, is 60 to 65%. The left ventricle has normal function. The left ventricle has no regional wall motion abnormalities. The left ventricular internal cavity size was normal in size. There is  moderate left ventricular hypertrophy. Left ventricular diastolic parameters are indeterminate. Right Ventricle: The right ventricular size is normal. No increase in right ventricular wall thickness. Right ventricular systolic function is normal. There is normal pulmonary artery systolic pressure. The tricuspid regurgitant velocity is 2.64 m/s,  and  with an assumed right atrial pressure of 8 mmHg, the estimated right ventricular systolic pressure is 35.9 mmHg. Left Atrium: Left atrial size was normal in size. Right Atrium: Right atrial size was normal in size. Pericardium: There is no evidence of pericardial effusion. Mitral Valve: The mitral valve is degenerative in appearance. Mild mitral valve regurgitation. No evidence of mitral valve stenosis. Tricuspid Valve: The tricuspid valve is normal in structure. Tricuspid valve regurgitation is moderate . No evidence of tricuspid stenosis. Aortic Valve: The aortic valve is tricuspid. Aortic valve regurgitation is not visualized. Aortic valve sclerosis is present, with no evidence of aortic valve stenosis. Pulmonic Valve: The pulmonic valve was not well visualized. Pulmonic valve regurgitation is not visualized. Aorta: The aortic root and ascending aorta are structurally normal, with no evidence of dilitation. IAS/Shunts: Findings suggestive of small PFO (image 35).  LEFT VENTRICLE PLAX 2D LVIDd:         4.60 cm      Diastology LVIDs:         2.70 cm      LV e' medial:    4.90 cm/s LV PW:         1.30 cm      LV E/e' medial:  21.8 LV IVS:        1.40 cm      LV e' lateral:   10.00 cm/s LVOT diam:     2.20 cm      LV E/e' lateral: 10.7 LV SV:          130 LV SV Index:   66 LVOT Area:     3.80 cm  LV Volumes (MOD) LV vol d, MOD A2C: 120.0 ml LV vol d, MOD A4C: 98.3 ml LV vol s, MOD A2C: 30.9 ml LV vol s, MOD A4C: 25.9 ml LV SV MOD A2C:     89.1 ml LV SV MOD A4C:     98.3 ml LV SV MOD BP:      83.2 ml RIGHT VENTRICLE             IVC RV S prime:     19.10 cm/s  IVC diam: 1.20 cm TAPSE (M-mode): 3.0 cm LEFT ATRIUM             Index        RIGHT ATRIUM           Index LA diam:        4.10 cm 2.08 cm/m   RA Area:     16.70 cm LA Vol (A2C):   60.5 ml 30.67 ml/m  RA Volume:   46.10 ml  23.37 ml/m LA Vol (A4C):   38.2 ml 19.36 ml/m LA Biplane Vol: 48.4 ml 24.53 ml/m  AORTIC VALVE LVOT Vmax:   131.00 cm/s LVOT Vmean:  86.500 cm/s LVOT VTI:    0.343 m  AORTA Ao Root diam: 3.20 cm Ao Asc diam:  3.40 cm MITRAL VALVE                TRICUSPID VALVE MV Area (PHT): 2.80 cm     TR Peak grad:   27.9 mmHg MV Decel Time: 271 msec     TR Vmax:        264.00 cm/s MV E velocity: 107.00 cm/s MV A velocity: 122.00 cm/s  SHUNTS MV E/A ratio:  0.88         Systemic VTI:  0.34 m  Systemic Diam: 2.20 cm Sunit Tolia Electronically signed by Madonna Large Signature Date/Time: 04/30/2024/3:36:03 PM    Final    NM Pulmonary Perfusion Result Date: 04/29/2024 CLINICAL DATA:  High probability for pulmonary embolism. Shortness of breath. EXAM: NUCLEAR MEDICINE PERFUSION LUNG SCAN TECHNIQUE: Perfusion images were obtained in multiple projections after intravenous injection of radiopharmaceutical. Ventilation scans intentionally deferred if perfusion scan and chest x-ray adequate for interpretation during COVID 19 epidemic. RADIOPHARMACEUTICALS:  4.3 mCi Tc-62m MAA IV COMPARISON:  CT chest and chest x-ray 04/28/2024. FINDINGS: There is normal homogeneous perfusion throughout both lungs. No perfusion defects are identified. IMPRESSION: Normal examination. Electronically Signed   By: Greig Pique M.D.   On: 04/29/2024 15:56        Scheduled Meds:   amLODipine   10 mg Oral Daily   arformoterol   15 mcg Nebulization BID   aspirin  EC  81 mg Oral Daily   budesonide  (PULMICORT ) nebulizer solution  0.25 mg Nebulization BID   busPIRone   5 mg Oral BID   calcitRIOL   0.5 mcg Oral q AM   cloNIDine   0.2 mg Oral QHS   doxycycline   100 mg Oral Q12H   furosemide   80 mg Intravenous Q12H   heparin  injection (subcutaneous)  5,000 Units Subcutaneous Q8H   hydrALAZINE   100 mg Oral Q8H   insulin  aspart  0-20 Units Subcutaneous TID WC   insulin  aspart  0-5 Units Subcutaneous QHS   insulin  aspart  4 Units Subcutaneous TID WC   insulin  glargine-yfgn  26 Units Subcutaneous Daily   isosorbide  mononitrate  60 mg Oral Daily   metoprolol  tartrate  25 mg Oral BID   pantoprazole   40 mg Oral BID   revefenacin   175 mcg Nebulization Daily   rOPINIRole   0.25 mg Oral QHS   rosuvastatin   10 mg Oral Daily   Continuous Infusions:   LOS: 3 days    Time spent: 52 minutes    Renato Applebaum, MD [facility]

## 2024-05-01 NOTE — Inpatient Diabetes Management (Signed)
 Inpatient Diabetes Program Recommendations  AACE/ADA: New Consensus Statement on Inpatient Glycemic Control (2015)  Target Ranges:  Prepandial:   less than 140 mg/dL      Peak postprandial:   less than 180 mg/dL (1-2 hours)      Critically ill patients:  140 - 180 mg/dL   Lab Results  Component Value Date   GLUCAP 316 (H) 05/01/2024   HGBA1C 8.2 (A) 03/05/2024    Review of Glycemic Control  Latest Reference Range & Units 04/30/24 06:31 04/30/24 12:22 04/30/24 16:15 04/30/24 20:56 05/01/24 06:11 05/01/24 12:03  Glucose-Capillary 70 - 99 mg/dL 644 (H) 698 (H) 715 (H) 350 (H) 171 (H) 316 (H)  (H): Data is abnormally high  Diabetes history: DM2 Outpatient Diabetes medications: Tresiba  40 units at bedtime, Novolog  10-14 units TID Current orders for Inpatient glycemic control: Semglee  26 units every day, Novolog  0-20 units TID and 0-5 units at bedtime, Novolog  4 units MCTID and prednisone  40 mg QD  Inpatient Diabetes Program Recommendations:    Novolog  6 units TID with meals if she consumes at least 50%  Thank you, Wyvonna Pinal, MSN, CDCES Diabetes Coordinator Inpatient Diabetes Program [phone] (team pager from 8a-5p)

## 2024-05-01 NOTE — Progress Notes (Signed)
 NAME:  [name], MRN:  [id number], DOB:  04/04/1946, LOS: 3 ADMISSION DATE:  04/27/2024, CONSULTATION DATE:  04/29/24 REFERRING MD:  Dr Susannah, CHIEF COMPLAINT:  SOB   History of Present Illness:   Expand All Collapse All     NAME:  [name], MRN:  [id number], DOB:  03-May-1946, LOS: 1 ADMISSION DATE:  04/27/2024, CONSULTATION DATE:  8/10 REFERRING MD:  Geralynn , CHIEF COMPLAINT:  inc SOB    History of Present Illness:  78 year old female patient with a history of severe chronic struct of pulmonary disease with a FEV1 of 1.21 which is 50% predicted back in 2020.  She is followed by our clinic and was last seen in March 2025 at which time continued to report baseline exertional dyspnea.  She also has a history of a lung nodule which has been stable and being followed by imaging. She was admitted on 8/9 with chief complaint of shortness of breath.  Noted it to be increasing over approximately 3 days.  Prior to arrival.  Denied any URI symptoms.  No sick exposures.  Did endorse increased swelling lower extremities.  Left was greater than right both counts were painful she denied chest pain.  Denied any nausea or vomiting.  Did report decreased appetite due to shortness of breath.  She also had a fall at home. In the ER Diagnostic evaluation included the following: Portable chest x-ray this was essentially clear, there was known right lung basilar granuloma but no edema or infiltrate.  A noncontrast CT of chest was subsequently obtained showed some basilar bronchiectasis, scattered calcified 9 calcified nodules throughout both lungs but otherwise no infiltrates.  Chronic emphysematous changes were noted.   She was admitted for a working diagnosis of acute exacerbation exacerbation of her COPD  In ER she was not hypoxic but did report symptomatic improvement with supplemental oxygen.  Initial blood pressure systolically in the 200s which was treated with sublingual nitroglycerin . Therapeutic  interventions have included the following: Supplemental oxygen, empiric doxycycline , scheduled bronchodilators, and oral systemic steroids. She has continued to have fairly significant shortness of breath over the course of her hospitalization.  On physical exam apparently her wheezing had improved in comparison to the day of admission.  She had a VQ scan on the 10th which is day of pulmonary consult and this was negative for pulmonary emboli.  She had a left lower extremity ultrasound and this was negative for clot.  She has been seen by nephrology in regards to her underlying CKD stage V, but because of ongoing symptoms, and recurrent exacerbations with unclear triggers pulmonary has been asked to evaluate.      Pertinent  Medical History  Ckd 5, CAD, carotid stenosis, prior CVA, history of brain aneurysm, non-insulin -dependent type 2 diabetes, pulmonary nodule, tobacco abuse, copd,  known history of esophageal stricture arthritis GERD hiatal hernia anxiety disorder, aortic arthrosclerosis, migraine headaches, hypertension, PVD. Pulmonary history, Last seen by Dr. Theophilus in March 2025 at which time still endorsed exertional dyspnea.  At that point reported that she felt cool as a cucumber at rest but would get quite short of breath with any exertion.  She had been using Spiriva  however this has been stopped due to throat soreness Was still actively smoking back in March 2025, had a 16-year pack history. In regards to her lung nodule that was first identified in 2022 this is remained stable She has been on Trelegy inhaler for her COPD PFTs on record FEV1 is  1.21 (50%) he FVC was 64% at 2.03 DL CO 84.6; Impression severe obstructive airway disease with minimal diffusion defect, this was back in 2020 follow-ups had been ordered but not completed. Last echocardiogram was In April 2025 at which point her EF was estimated at 60 to 65% there was no regional wall abnormality there was mild left ventricular  hypertrophy there was grade 1 diastolic dysfunction RV size and function was normal there was trivial mitral regurg there was no evidence of aortic stenosis  Significant Hospital Events: Including procedures, antibiotic start and stop dates in addition to other pertinent events   8/9 admitted with COPD exacerbation started on antibiotics bronchodilators systemic steroids CT chest negative for infiltrate stable calcified nodules which were small and not related to presenting symptoms.  Chest x-ray clear 8/10 VQ scan low probability of PE Ultrasound of bilateral lower extremities, was found to have incidental near occlusion of the right superficial femoral artery but no DVT.  And there was no DVT in the left lower extremity.  Interim History / Subjective:  Awake and interactive Still have difficulty with her breathing this morning Remains on oxygen supplementation  Objective    Blood pressure (!) 163/41, pulse 63, temperature 98.1 F (36.7 C), temperature source Oral, resp. rate 18, height 5' 6 (1.676 m), weight 88.9 kg, SpO2 99%.        Intake/Output Summary (Last 24 hours) at 05/01/2024 1056 Last data filed at 05/01/2024 0830 Gross per 24 hour  Intake 800 ml  Output 1150 ml  Net -350 ml   Filed Weights   04/27/24 2359 04/30/24 0643 05/01/24 0417  Weight: 81.2 kg 87.9 kg 88.9 kg    Examination: General: Elderly, does not appear to be in distress HENT: Moist oral mucosa Lungs: Decreased air movement bilaterally with few rales at the bases Cardiovascular: S1-S2 appreciated, systolic murmur appreciated Abdomen: Soft, bowel sounds appreciated Extremities: Lower extremity edema Neuro: Awake alert oriented GU:   I reviewed last 24 h vitals and pain scores, last 48 h intake and output, last 24 h labs and trends, and last 24 h imaging results. Echocardiogram shows normal ejection fraction, normal right-sided function, borderline pulmonary artery systolic pressure of 35.9  Last PFT  was in 2020-FEV1 of 50%  Most recent CT scan of the chest August 2025 shows evidence of emphysema Resolved problem list   Assessment and Plan   Exacerbation of COPD Severe COPD at baseline -GERD Hiatal hernia Esophageal stricture  -Management of obstructive lung disease revolves around bronchodilators - Already on Trelegy - May be discharged on Trelegy 200 - Should have a rescue inhaler available for exacerbations  -Will benefit from a repeat pulmonary function test when more stable  Hypoxemic respiratory failure - Was taken off oxygen during the visit today and saturations remained above 97% - Will require ambulatory oximetry to ascertain whether she needs oxygen supplementation  History of diastolic heart failure Stage IV chronic kidney disease  Echocardiogram showing borderline pulmonary pressures - Mild pulmonary hypertension likely - This is likely secondary to chronic diastolic heart failure and underlying obstructive lung disease  Calcified lung nodules  Discussed with Dr. Raenelle  Will sign off

## 2024-05-01 NOTE — Progress Notes (Signed)
 [facility] Progress Note   Assessment/ Plan:   Assessment/ Plan: AKI on CKD 5: b/l creat 4.6 from may 2025, eGFR  from April 2025, eGFR 13-16 ml/min. Creat here was 5.8 on admission. Her main c/o is SOB and orthopnea. She has been taking her po lasix  80 bid at home, no recent changes in dosing. BP's are stable, on multiple BP meds. Imaging is not showing pulm edema, have consulted to help with the cause of her sig SOB/ orthopnea, appreciate their assistance. She is getting her home dose lasix  80mg  bid, and a renal diet (changed FR to 1200cc). Creat 5.8 could be AKI vs a new baseline. She is not grossly uremic.  - continue Lasix  80 BID  - nothing else to offer at present- will sign off.  Will need close followup with Dr Dennise upon discharge.   SOB/ orthopnea: as above, consulting pulm team due to insignificant help from imaging.  Anemia ckd: Hb 9.5, follow.  HTN: per the patient has been hard to control for over a year. Not bad right now at 165/41 range. Cont home meds.  H/o CVA PAD DM2 on insulin   Subjective:    Seen in room.  No big issues today.  Cr is about the same.     Objective:   BP (!) 154/35 (BP Location: Left Arm)   Pulse (!) 57   Temp 97.6 F (36.4 C) (Oral)   Resp 20   Ht 5' 6 (1.676 m)   Wt 88.9 kg   SpO2 96%   BMI 31.63 kg/m   Physical Exam: Gen:NAD CVS: regular Resp: unlabored Ext: no LE edema ACCESS AVF   Labs: BMET Recent Labs  Lab 04/28/24 0013 04/29/24 0333 04/30/24 0439 05/01/24 0337  NA 138 136 134* 137  K 3.4* 4.1 4.5 4.2  CL 100 100 98 101  CO2 27 25 26 27   GLUCOSE 97 336* 399* 260*  BUN 47* 63* 78* 88*  CREATININE 5.81* 6.07* 5.98* 6.01*  CALCIUM  10.5* 9.9 9.8 9.5  PHOS  --  5.8*  --  5.5*   CBC Recent Labs  Lab 04/28/24 0013 04/29/24 0333 04/30/24 0439 05/01/24 0337  WBC 10.3 13.6* 13.4* 13.3*  HGB 10.4* 9.5* 9.8* 9.6*  HCT 32.8* 29.5* 29.9* 29.9*  MCV 101.5* 100.0 99.7 100.3*  PLT 254 222 223 215       Medications:     amLODipine   10 mg Oral Daily   arformoterol   15 mcg Nebulization BID   aspirin  EC  81 mg Oral Daily   budesonide  (PULMICORT ) nebulizer solution  0.25 mg Nebulization BID   busPIRone   5 mg Oral BID   calcitRIOL   0.5 mcg Oral q AM   cloNIDine   0.2 mg Oral QHS   doxycycline   100 mg Oral Q12H   furosemide   80 mg Intravenous Q12H   heparin  injection (subcutaneous)  5,000 Units Subcutaneous Q8H   hydrALAZINE   100 mg Oral Q8H   insulin  aspart  0-20 Units Subcutaneous TID WC   insulin  aspart  0-5 Units Subcutaneous QHS   insulin  aspart  4 Units Subcutaneous TID WC   insulin  glargine-yfgn  26 Units Subcutaneous Daily   isosorbide  mononitrate  60 mg Oral Daily   metoprolol  tartrate  25 mg Oral BID   pantoprazole   40 mg Oral BID   revefenacin   175 mcg Nebulization Daily   rOPINIRole   0.25 mg Oral QHS   rosuvastatin   10 mg Oral Daily     Almarie  Gearline, MD 05/01/2024, 12:21 PM

## 2024-05-01 NOTE — Progress Notes (Signed)
 Pt had been complaining of pain in the middle of her chest tonight. It eased up after she rested after her breathing treatments but has flared up after vomiting. Patient can't really describe pain and says it doesn't radiate. BP 169/77 HR 81. Got an EKG to be safe; it said nonspecific ST abnormality.  Informed the on call physician who ordered a one time NTG sublingual tablet and STAT troponin labs for now and then in two hours.  After the NTG tablet was given, pt said her chest pain went from 10 to 7.  Will continue to monitor.  Kristene Sotero BRAVO, RN

## 2024-05-01 NOTE — Progress Notes (Signed)
 Mobility Specialist Progress Note:   05/01/24 1035  Mobility  Activity Ambulated with assistance  Level of Assistance Modified independent, requires aide device or extra time  Assistive Device None  Distance Ambulated (ft) 150 ft  Activity Response Tolerated well  Mobility Referral Yes  Mobility visit 1 Mobility  Mobility Specialist Start Time (ACUTE ONLY) 1035  Mobility Specialist Stop Time (ACUTE ONLY) 1052  Mobility Specialist Time Calculation (min) (ACUTE ONLY) 17 min   Pt received in bed, agreeable to ambulatory O2 test. ModI to ambulate. Tolerated well, audible SOB throughout. Returned pt to room, sitting up with all needs met. SpO2 93-96% on RA throughout session.   [name] Mobility Specialist Please contact via Special educational needs teacher or  Rehab office at [phone]

## 2024-05-02 ENCOUNTER — Other Ambulatory Visit: Payer: Self-pay | Admitting: Internal Medicine

## 2024-05-02 ENCOUNTER — Encounter (HOSPITAL_COMMUNITY)
Admission: RE | Admit: 2024-05-02 | Discharge: 2024-05-02 | Disposition: A | Discharge status: Home / Self Care | Source: Ambulatory Visit | Attending: Nephrology | Admitting: Nephrology

## 2024-05-02 ENCOUNTER — Inpatient Hospital Stay (HOSPITAL_COMMUNITY): Admission: RE | Admit: 2024-05-02 | Source: Ambulatory Visit

## 2024-05-02 DIAGNOSIS — J441 Chronic obstructive pulmonary disease with (acute) exacerbation: Secondary | ICD-10-CM | POA: Diagnosis not present

## 2024-05-02 DIAGNOSIS — M170 Bilateral primary osteoarthritis of knee: Secondary | ICD-10-CM

## 2024-05-02 LAB — GLUCOSE, CAPILLARY: Glucose-Capillary: 148 mg/dL — ABNORMAL HIGH (ref 70–99)

## 2024-05-02 LAB — TROPONIN I (HIGH SENSITIVITY): Troponin I (High Sensitivity): 52 ng/L — ABNORMAL HIGH (ref <18)

## 2024-05-02 MED ORDER — IPRATROPIUM-ALBUTEROL 0.5-2.5 (3) MG/3ML IN SOLN: 3.0000 mL | RESPIRATORY_TRACT | 2 refills | Status: AC | PRN

## 2024-05-02 MED ORDER — SODIUM CHLORIDE 0.9 % IV SOLN
510.0000 mg | Freq: Once | INTRAVENOUS | Status: AC
  Administered 2024-05-02 (×2): 510 mg via INTRAVENOUS
  Filled 2024-05-02: qty 510

## 2024-05-02 MED ORDER — FUROSEMIDE 40 MG PO TABS
80.0000 mg | ORAL_TABLET | Freq: Two times a day (BID) | ORAL | Status: DC
  Administered 2024-05-02 (×2): 80 mg via ORAL
  Filled 2024-05-02: qty 2

## 2024-05-02 MED ORDER — PANTOPRAZOLE SODIUM 40 MG PO TBEC: 40.0000 mg | DELAYED_RELEASE_TABLET | Freq: Two times a day (BID) | ORAL | 0 refills | Status: AC

## 2024-05-02 MED ORDER — SODIUM CHLORIDE 0.9 % IV SOLN: 510.0000 mg | Freq: Once | INTRAVENOUS | Status: DC

## 2024-05-02 NOTE — TOC Transition Note (Signed)
 Transition of Care (TOC) - Discharge Note Rayfield Gobble RN, BSN Inpatient Care Management Unit 4E- RN Case Manager See Treatment Team for direct phone #   Patient Details  Name: [name] MRN: [id number] Date of Birth: 1946/08/06  Transition of Care [facility]) CM/SW Contact:  Gobble Rayfield Hurst, RN Phone Number: 05/02/2024, 11:57 AM   Clinical Narrative:    Pt stable for transition home today, notified by MD that pt will need nebulizer for home- order placed.   CM spoke with pt at bedside- pt voiced her spouse will transport home after her iron infusion. Discussed nebulizer need- pt voiced she did not have a preference on provider- she has never used nebs before only handheld inhalers.   Call made to [employer] liaison for DME- nebulizer machine- device to be delivered to pt prior to discharge.   No further CM needs noted.    Final next level of care: Home/Self Care Barriers to Discharge: No Barriers Identified   Patient Goals and CMS Choice Patient states their goals for this hospitalization and ongoing recovery are:: return home   Choice offered to / list presented to : Patient      Discharge Placement               Home        Discharge Plan and Services Additional resources added to the After Visit Summary for     Discharge Planning Services: CM Consult Post Acute Care Choice: Durable Medical Equipment          DME Arranged: Nebulizer machine DME Agency: [employer] Date DME Agency Contacted: 05/02/24 Time DME Agency Contacted: 1003 Representative spoke with at DME Agency: Zack HH Arranged: NA HH Agency: NA        Social Drivers of Health (SDOH) Interventions SDOH Screenings   Food Insecurity: No Food Insecurity (04/29/2024)  Housing: Low Risk  (04/29/2024)  Transportation Needs: No Transportation Needs (04/29/2024)  Utilities: Not At Risk (04/29/2024)  Depression (PHQ2-9): Low Risk  (02/10/2024)  Financial Resource Strain: Low Risk  (09/19/2023)   Physical Activity: Unknown (09/19/2023)  Social Connections: Socially Integrated (04/29/2024)  Stress: Stress Concern Present (09/19/2023)  Tobacco Use: High Risk (04/28/2024)     Readmission Risk Interventions    05/02/2024   11:57 AM 03/21/2024   11:48 AM 11/20/2021    2:37 PM  Readmission Risk Prevention Plan  Transportation Screening Complete Complete Complete  PCP or Specialist Appt within 5-7 Days   Complete  PCP or Specialist Appt within 3-5 Days  Complete   Home Care Screening   Complete  Medication Review (RN CM)   Complete  HRI or Home Care Consult Complete Complete   Social Work Consult for Recovery Care Planning/Counseling  Complete   Palliative Care Screening Not Applicable Not Applicable   Medication Review Oceanographer) Complete Referral to Pharmacy

## 2024-05-02 NOTE — Progress Notes (Signed)
 [name] Finder to be D/C'd Home per MD order.  Discussed with the patient and all questions fully answered.  VSS, Skin clean, dry and intact without evidence of skin break down, no evidence of skin tears noted. IV catheter discontinued intact. Site without signs and symptoms of complications. Dressing and pressure applied.  An After Visit Summary was printed and given to the patient. Patient received prescription.  D/c education completed with patient/family including follow up instructions, medication list, d/c activities limitations if indicated, with other d/c instructions as indicated by MD - patient able to verbalize understanding, all questions fully answered.   Patient instructed to return to ED, call 911, or call MD for any changes in condition.   Patient escorted via WC, and D/C home via private auto.  [name] 05/02/2024 11:23 AM

## 2024-05-02 NOTE — Progress Notes (Signed)
 [name] Finder to be D/C'd Home per MD order.  Discussed with the patient and all questions fully answered.  VSS, Skin clean, dry and intact without evidence of skin break down, no evidence of skin tears noted. IV catheter discontinued intact. Site without signs and symptoms of complications. Dressing and pressure applied.  An After Visit Summary was printed and given to the patient. Patient received prescription.  D/c education completed with patient/family including follow up instructions, medication list, d/c activities limitations if indicated, with other d/c instructions as indicated by MD - patient able to verbalize understanding, all questions fully answered.   Patient instructed to return to ED, call 911, or call MD for any changes in condition.   Patient escorted via WC, and D/C home via private auto.  [name] [name] 05/02/2024 11:23 AM

## 2024-05-02 NOTE — Progress Notes (Signed)
 Mobility Specialist Progress Note:   05/02/24 1027  Mobility  Activity Ambulated independently  Level of Assistance Modified independent, requires aide device or extra time  Assistive Device None  Distance Ambulated (ft) 100 ft  Activity Response Tolerated well  Mobility Referral Yes  Mobility visit 1 Mobility  Mobility Specialist Start Time (ACUTE ONLY) 1021  Mobility Specialist Stop Time (ACUTE ONLY) 1027  Mobility Specialist Time Calculation (min) (ACUTE ONLY) 6 min   Pt received in chair, agreeable to mobility session. Ambulated in room, getting dressed, preparing for d/c. Tolerated well, asx throughout. Left with all needs met.   Evlyn Amason Mobility Specialist Please contact via Special educational needs teacher or  Rehab office at [phone]

## 2024-05-02 NOTE — Care Management Important Message (Signed)
 Important Message  Patient Details  Name: [name] MRN: [id number] Date of Birth: 02-07-1946   Important Message Given:  Yes - Medicare IM     [name] 05/02/2024, 9:45 AM

## 2024-05-02 NOTE — Discharge Summary (Signed)
 Physician Discharge Summary  [name] FMW:[id number] DOB: Jan 05, 1946 DOA: 04/27/2024  PCP: Lenon Nell SAILOR, FNP  Admit date: 04/27/2024 Discharge date: 05/02/2024  Admitted From: Home Disposition: Home  Recommendations for Outpatient Follow-up:  Follow up with PCP in 1-2 weeks Please obtain BMP/CBC in one week as scheduled with nephrology  Home Health: N/A Equipment/Devices: Nebulizer  Discharge Condition: Fair CODE STATUS: Full code Diet recommendation: Low-salt diet, no smoking, fluid restriction 1200 mL/day  Discharge summary: 78 y.o. female with medical history significant of coronary artery disease, CVA, carotid stenosis, history of brain aneurysm, history of CVA, diabetes non-insulin -dependent type II, chronic kidney disease stage V, COPD, pulmonary nodules, ongoing smoker, anxiety disorder, aortic atherosclerosis, migraine headaches, essential hypertension, hyperlipidemia and peripheral vascular disease who presented with acute on chronic shortness of breath.  Admitted for acute hypoxemic respiratory failure thought secondary to COPD exacerbation . Also with advanced kidney disease, currently not requiring dialysis.  Treated for COPD exacerbation and multifactorial shortness of breath, stabilized and going home with outpatient follow-up.  Acute hypoxemic respiratory failure secondary to COPD exacerbation: Initially presented with shortness of breath, hypoxemia needing 3 L of oxygen to improve her saturation. Ongoing smoker. Patient was treated with bronchodilator therapy, oral steroids, inhalational steroids and doxycycline .  She did good clinical recovery.  Now on room air.  Occasional wheezing present.  Also seen by pulmonary. She will need updated PFTs that will be done in the pulmonary office. Patient will be going home on continuing trelegy Ellipta .  Patient was prescribed albuterol  inhaler along with dispenser. Completed 5 days of steroids, will avoid further  steroids. Completed 5 days of doxycycline , will avoid further antibiotics. She is on room air and mobilize around without hypoxemia.   CKD stage V: Followed by nephrology.  Currently not recommending any dialysis.  No evidence of fluid overload or uremia.  Close monitoring recommended.  Continue high-dose Lasix  on discharge.   Microcytic anemia: Getting IV iron as outpatient.  Could not be arranged for Feraheme infusion while in the hospital.  She will reschedule follow-up.   Right SFA occlusion: Stable.  On aspirin  and statin.   Type 2 diabetes, steroid-induced hyperglycemia: On insulin .  Continue.   Smoker: Counseled to quit.   Right knee pain: X-ray shows tricompartmental osteoarthritis.  She may benefit with outpatient follow-up with orthopedics.    Stable for discharge.  She is scheduled for follow-up next week with nephrology to repeat her renal functions and monitoring.   Discharge Diagnoses:  Principal Problem:   Acute exacerbation of chronic obstructive pulmonary disease (COPD) (HCC) Active Problems:   Acute hypoxic respiratory failure [facility])    Discharge Instructions  Discharge Instructions     Diet - low sodium heart healthy   Complete by: As directed    Fluid restriction 1200 ml / 24 hours   Increase activity slowly   Complete by: As directed       Allergies as of 05/02/2024       Reactions   Ciprofloxacin  Swelling   Ozempic  (0.25 Or 0.5 Mg-dose) [semaglutide (0.25 Or 0.5mg -dos)] Nausea And Vomiting   Plavix [clopidogrel Bisulfate] Palpitations   Amoxicillin Itching, Swelling   FACE & EYES SWELL   Ace Inhibitors Cough   Sore throat   Atorvastatin     myalgias   Lyrica [pregabalin] Itching, Nausea Only    gain weight   Penicillins Other (See Comments)   Pt unsure if there is an allergic reaction         Medication List  TAKE these medications    acetaminophen  500 MG tablet Commonly known as: TYLENOL  Take 1,000 mg by mouth 2 (two) times  daily.   amLODipine  10 MG tablet Commonly known as: NORVASC  Take 1 tablet (10 mg total) by mouth daily.   aspirin  EC 81 MG tablet Take 1 tablet (81 mg total) by mouth daily. Swallow whole.   busPIRone  5 MG tablet Commonly known as: BUSPAR  Take 1 tablet (5 mg total) by mouth 2 (two) times daily. What changed: when to take this   calcitRIOL  0.5 MCG capsule Commonly known as: ROCALTROL  Take 0.5 mcg by mouth in the morning.   cloNIDine  0.1 MG tablet Commonly known as: CATAPRES  Take 1 tablet (0.1 mg total) by mouth at bedtime.   Fish Oil 1000 MG Caps Take 1,000 mg by mouth in the morning and at bedtime.   furosemide  40 MG tablet Commonly known as: LASIX  Take 80 mg by mouth 2 (two) times daily.   hydrALAZINE  100 MG tablet Commonly known as: APRESOLINE  Take 1 tablet (100 mg total) by mouth every 8 (eight) hours.   Insulin  Degludec FlexTouch 200 UNIT/ML Sopn Inject 40 Units into the skin daily.   ipratropium-albuterol  0.5-2.5 (3) MG/3ML Soln Commonly known as: DUONEB Take 3 mLs by nebulization every 4 (four) hours as needed (Shortness of breath or Wheezing).   isosorbide  mononitrate 60 MG 24 hr tablet Commonly known as: IMDUR  Take 1 tablet (60 mg total) by mouth daily.   metoprolol  tartrate 25 MG tablet Commonly known as: LOPRESSOR  Take 1 tablet (25 mg total) by mouth 2 (two) times daily.   NovoLOG  FlexPen 100 UNIT/ML FlexPen Generic drug: insulin  aspart Inject 10-14 Units into the skin 3 (three) times daily before meals.   pantoprazole  40 MG tablet Commonly known as: PROTONIX  Take 1 tablet (40 mg total) by mouth 2 (two) times daily.   Potassium Chloride  ER 20 MEQ Tbcr Take 2 tablets (40 mEq total) by mouth every other day.   rOPINIRole  0.25 MG tablet Commonly known as: REQUIP  Take 1 tablet (0.25 mg total) by mouth at bedtime. Take 0.25 mg by mouth at bedtime.   rosuvastatin  10 MG tablet Commonly known as: CRESTOR  Take 1 tablet (10 mg total) by mouth daily.    Trelegy Ellipta  200-62.5-25 MCG/ACT Aepb Generic drug: Fluticasone -Umeclidin-Vilant Inhale 1 puff into the lungs daily.               Durable Medical Equipment  (From admission, onward)           Start     Ordered   05/02/24 1002  For home use only DME Nebulizer machine  Once       Question Answer Comment  Patient needs a nebulizer to treat with the following condition COPD with acute exacerbation (HCC)   Length of Need Lifetime   Additional equipment included Administration kit      05/02/24 1001            Follow-up Information     [facility] Follow up.   Why: (Adapt) nebulizer machine arranged- to be delivered to pt prior to discharge Contact information: [address] [place] [address] [phone]                Allergies  Allergen Reactions   Ciprofloxacin  Swelling   Ozempic  (0.25 Or 0.5 Mg-Dose) [Semaglutide (0.25 Or 0.5mg -Dos)] Nausea And Vomiting   Plavix [Clopidogrel Bisulfate] Palpitations   Amoxicillin Itching and Swelling    FACE & EYES SWELL  Ace Inhibitors Cough    Sore throat   Atorvastatin      myalgias   Lyrica [Pregabalin] Itching and Nausea Only     gain weight   Penicillins Other (See Comments)    Pt unsure if there is an allergic reaction     Consultations: Nephrology Pulmonary   Procedures/Studies: DG Knee Right Port Result Date: 05/01/2024 CLINICAL DATA:  Osteoarthritis. EXAM: PORTABLE RIGHT KNEE - 1-2 VIEW COMPARISON:  None Available. FINDINGS: Patellofemoral tibiofemoral joint space narrowing. Mild tricompartmental peripheral spurring. No fracture, erosion, or focal bone abnormality. No joint effusion. Generalized soft tissue edema. Arterial vascular calcifications are seen. IMPRESSION: Mild tricompartmental osteoarthritis. Electronically Signed   By: Andrea Gasman M.D.   On: 05/01/2024 12:09   DG ESOPHAGUS W SINGLE CM (SOL OR THIN BA) Result Date: 04/30/2024 CLINICAL DATA:  Patient with  restrictive lung disease with concern for gastroesophageal reflux. Patient also was informed she had a hiatal hernia which she states she has had for years . EXAM: ESOPHAGUS/BARIUM SWALLOW/TABLET STUDY TECHNIQUE: Single contrast examination was performed using thin liquid barium. This exam was performed by Sari Lamp, PA-C, and was supervised and interpreted by Dr. Rockey Kilts. FLUOROSCOPY: Radiation Exposure Index (as provided by the fluoroscopic device): 19.6 mGy Kerma COMPARISON:  CT scan of the chest performed April 28, 2024 and CT scan of the abdomen pelvis performed October 14, 2023 FINDINGS: Esophagus: Normal appearance.  No mucosal ulcerations seen. Esophageal motility: No dysmotility identified. Hiatal Hernia: No hiatal hernia seen. Gastroesophageal reflux: No spontaneous gastroesophageal reflux seen and no reflux with cough or Valsalva. Ingested 13mm barium tablet: Passed from mouth to stomach with a brief hesitation in the distal thoracic esophagus. Other: Extremely limited exam secondary to patient's restrictive lung disease and limited mobility. Exam performed only in an LPO position. IMPRESSION: Limited, focused exam performed secondary to patient immobility. No hiatal hernia or evidence of gastroesophageal reflux. Electronically Signed   By: Rockey Kilts M.D.   On: 04/30/2024 18:44   ECHOCARDIOGRAM COMPLETE Result Date: 04/30/2024    ECHOCARDIOGRAM REPORT   Patient Name:   LENDY DITTRICH Date of Exam: 04/30/2024 Medical Rec #:  [id number]      Height:       66.0 in Accession #:    [id number]     Weight:       193.7 lb Date of Birth:  [birth year]       BSA:          1.973 m Patient Age:    78 years       BP:           150/41 mmHg Patient Gender: F              HR:           60 bpm. Exam Location:  Inpatient Procedure: 2D Echo, Cardiac Doppler and Color Doppler (Both Spectral and Color            Flow Doppler were utilized during procedure). Indications:    R06.02 SOB. Acute respiratory distress.   History:        Patient has prior history of Echocardiogram examinations, most                 recent 01/04/2024. Abnormal ECG, COPD and Stroke,                 Signs/Symptoms:Shortness of Breath and Dyspnea; Risk  Factors:Current Smoker, Diabetes and Hypertension.  Sonographer:    Ellouise Mose RDCS Referring Phys: [id number] PETER E BABCOCK  Sonographer Comments: Syngo delay IMPRESSIONS  1. Left ventricular ejection fraction, by estimation, is 60 to 65%. The left ventricle has normal function. The left ventricle has no regional wall motion abnormalities. There is moderate left ventricular hypertrophy. Left ventricular diastolic parameters are indeterminate.  2. Right ventricular systolic function is normal. The right ventricular size is normal. There is normal pulmonary artery systolic pressure. The estimated right ventricular systolic pressure is 35.9 mmHg.  3. The mitral valve is degenerative. Mild mitral valve regurgitation. No evidence of mitral stenosis.  4. Tricuspid valve regurgitation is moderate.  5. The aortic valve is tricuspid. Aortic valve regurgitation is not visualized. Aortic valve sclerosis is present, with no evidence of aortic valve stenosis.  6. Findings suggestive of small PFO (image 35). Comparison(s): A prior study was performed on 01/04/2024. Mild MR, RVSP 36 mmHG, and small PFO are new findings compared to prior study. FINDINGS  Left Ventricle: Left ventricular ejection fraction, by estimation, is 60 to 65%. The left ventricle has normal function. The left ventricle has no regional wall motion abnormalities. The left ventricular internal cavity size was normal in size. There is  moderate left ventricular hypertrophy. Left ventricular diastolic parameters are indeterminate. Right Ventricle: The right ventricular size is normal. No increase in right ventricular wall thickness. Right ventricular systolic function is normal. There is normal pulmonary artery systolic pressure. The tricuspid  regurgitant velocity is 2.64 m/s, and  with an assumed right atrial pressure of 8 mmHg, the estimated right ventricular systolic pressure is 35.9 mmHg. Left Atrium: Left atrial size was normal in size. Right Atrium: Right atrial size was normal in size. Pericardium: There is no evidence of pericardial effusion. Mitral Valve: The mitral valve is degenerative in appearance. Mild mitral valve regurgitation. No evidence of mitral valve stenosis. Tricuspid Valve: The tricuspid valve is normal in structure. Tricuspid valve regurgitation is moderate . No evidence of tricuspid stenosis. Aortic Valve: The aortic valve is tricuspid. Aortic valve regurgitation is not visualized. Aortic valve sclerosis is present, with no evidence of aortic valve stenosis. Pulmonic Valve: The pulmonic valve was not well visualized. Pulmonic valve regurgitation is not visualized. Aorta: The aortic root and ascending aorta are structurally normal, with no evidence of dilitation. IAS/Shunts: Findings suggestive of small PFO (image 35).  LEFT VENTRICLE PLAX 2D LVIDd:         4.60 cm      Diastology LVIDs:         2.70 cm      LV e' medial:    4.90 cm/s LV PW:         1.30 cm      LV E/e' medial:  21.8 LV IVS:        1.40 cm      LV e' lateral:   10.00 cm/s LVOT diam:     2.20 cm      LV E/e' lateral: 10.7 LV SV:         130 LV SV Index:   66 LVOT Area:     3.80 cm  LV Volumes (MOD) LV vol d, MOD A2C: 120.0 ml LV vol d, MOD A4C: 98.3 ml LV vol s, MOD A2C: 30.9 ml LV vol s, MOD A4C: 25.9 ml LV SV MOD A2C:     89.1 ml LV SV MOD A4C:     98.3 ml LV SV MOD BP:  83.2 ml RIGHT VENTRICLE             IVC RV S prime:     19.10 cm/s  IVC diam: 1.20 cm TAPSE (M-mode): 3.0 cm LEFT ATRIUM             Index        RIGHT ATRIUM           Index LA diam:        4.10 cm 2.08 cm/m   RA Area:     16.70 cm LA Vol (A2C):   60.5 ml 30.67 ml/m  RA Volume:   46.10 ml  23.37 ml/m LA Vol (A4C):   38.2 ml 19.36 ml/m LA Biplane Vol: 48.4 ml 24.53 ml/m  AORTIC VALVE  LVOT Vmax:   131.00 cm/s LVOT Vmean:  86.500 cm/s LVOT VTI:    0.343 m  AORTA Ao Root diam: 3.20 cm Ao Asc diam:  3.40 cm MITRAL VALVE                TRICUSPID VALVE MV Area (PHT): 2.80 cm     TR Peak grad:   27.9 mmHg MV Decel Time: 271 msec     TR Vmax:        264.00 cm/s MV E velocity: 107.00 cm/s MV A velocity: 122.00 cm/s  SHUNTS MV E/A ratio:  0.88         Systemic VTI:  0.34 m                             Systemic Diam: 2.20 cm Sunit Tolia Electronically signed by Madonna Large Signature Date/Time: 04/30/2024/3:36:03 PM    Final    VAS US  LOWER EXTREMITY VENOUS (DVT) Result Date: 04/29/2024  Lower Venous DVT Study Patient Name:  SIHAAM CHROBAK  Date of Exam:   04/29/2024 Medical Rec #: [id number]       Accession #:    [id number] Date of Birth: 23-Dec-1945        Patient Gender: F Patient Age:   28 years Exam Location:  [facility] Procedure:      VAS US  LOWER EXTREMITY VENOUS (DVT) Referring Phys: JEFFREY WALDEN --------------------------------------------------------------------------------  Indications: Swelling, and Edema.  Risk Factors: Pt has known PAD. Performing Technologist: Elmarie Lindau, RVT  Examination Guidelines: A complete evaluation includes B-mode imaging, spectral Doppler, color Doppler, and power Doppler as needed of all accessible portions of each vessel. Bilateral testing is considered an integral part of a complete examination. Limited examinations for reoccurring indications may be performed as noted. The reflux portion of the exam is performed with the patient in reverse Trendelenburg.  +---------+---------------+---------+-----------+----------+--------------+ RIGHT    CompressibilityPhasicitySpontaneityPropertiesThrombus Aging +---------+---------------+---------+-----------+----------+--------------+ CFV      Full           Yes      Yes                                 +---------+---------------+---------+-----------+----------+--------------+ SFJ      Full                                                         +---------+---------------+---------+-----------+----------+--------------+ FV Prox  Full                                                        +---------+---------------+---------+-----------+----------+--------------+  FV Mid   Full                                                        +---------+---------------+---------+-----------+----------+--------------+ FV DistalFull                                                        +---------+---------------+---------+-----------+----------+--------------+ PFV      Full                                                        +---------+---------------+---------+-----------+----------+--------------+ POP      Full           Yes      Yes                                 +---------+---------------+---------+-----------+----------+--------------+ PTV      Full                                                        +---------+---------------+---------+-----------+----------+--------------+ PERO     Full                                                        +---------+---------------+---------+-----------+----------+--------------+ There is a heterogeneous, fluid filled appearing, nonvascularized area in the right popliteal space measuring 4.43 x 1.09 cm at its maximum diameter.  +---------+---------------+---------+-----------+----------+--------------+ LEFT     CompressibilityPhasicitySpontaneityPropertiesThrombus Aging +---------+---------------+---------+-----------+----------+--------------+ CFV      Full           Yes      Yes                                 +---------+---------------+---------+-----------+----------+--------------+ SFJ      Full                                                        +---------+---------------+---------+-----------+----------+--------------+ FV Prox  Full                                                         +---------+---------------+---------+-----------+----------+--------------+ FV Mid   Full                                                        +---------+---------------+---------+-----------+----------+--------------+  FV DistalFull                                                        +---------+---------------+---------+-----------+----------+--------------+ PFV      Full                                                        +---------+---------------+---------+-----------+----------+--------------+ POP      Full           Yes      Yes                                 +---------+---------------+---------+-----------+----------+--------------+ PTV      Full                                                        +---------+---------------+---------+-----------+----------+--------------+ PERO     Full                                                        +---------+---------------+---------+-----------+----------+--------------+ Incidental finding: There is a possible near occlusion vs occlusion in the right SFA.    Summary: RIGHT: - There is no evidence of deep vein thrombosis in the lower extremity.  - A cystic structure is found in the popliteal fossa. - There is a heterogeneous, fluid filled appearing, nonvascularized area in the right popliteal space measuring 4.43 x 1.09 cm at its maximum diameter. There is an incidental finding of a near occluded versus an occluded right superficial femoral artery.  LEFT: - There is no evidence of deep vein thrombosis in the lower extremity.  - No cystic structure found in the popliteal fossa.  *See table(s) above for measurements and observations. Electronically signed by Norman Serve on 04/29/2024 at 4:09:40 PM.    Final    NM Pulmonary Perfusion Result Date: 04/29/2024 CLINICAL DATA:  High probability for pulmonary embolism. Shortness of breath. EXAM: NUCLEAR MEDICINE PERFUSION LUNG SCAN TECHNIQUE: Perfusion images were  obtained in multiple projections after intravenous injection of radiopharmaceutical. Ventilation scans intentionally deferred if perfusion scan and chest x-ray adequate for interpretation during COVID 19 epidemic. RADIOPHARMACEUTICALS:  4.3 mCi Tc-55m MAA IV COMPARISON:  CT chest and chest x-ray 04/28/2024. FINDINGS: There is normal homogeneous perfusion throughout both lungs. No perfusion defects are identified. IMPRESSION: Normal examination. Electronically Signed   By: Greig Pique M.D.   On: 04/29/2024 15:56   CT Chest Wo Contrast Result Date: 04/28/2024 CLINICAL DATA:  Chronic shortness of breath EXAM: CT CHEST WITHOUT CONTRAST TECHNIQUE: Multidetector CT imaging of the chest was performed following the standard protocol without IV contrast. RADIATION DOSE REDUCTION: This exam was performed according to the departmental dose-optimization program which includes automated exposure control, adjustment of the mA and/or kV according to patient  size and/or use of iterative reconstruction technique. COMPARISON:  Chest x-ray from earlier in the same day. FINDINGS: Cardiovascular: Somewhat limited due to lack of IV contrast. Atherosclerotic calcifications of the aorta are noted. No aneurysmal dilatation is seen. The heart is enlarged in size. Coronary calcifications are noted. Mediastinum/Nodes: Thoracic inlet is within normal limits. No hilar or mediastinal adenopathy is noted. The esophagus as visualized is within normal limits. Lungs/Pleura: Lungs are well aerated bilaterally. Calcified granuloma is seen in the right base. Similar findings are noted in the right middle lobe. A few scattered noncalcified nodules are seen measuring less than 3 mm. Previously seen 6 mm nodule in the medial aspect of the left upper lobe is no longer identified and was likely postinflammatory in nature. Generalized emphysematous changes are seen. Upper Abdomen: Visualized upper abdomen shows scattered calcified granulomas. Adrenal  thickening is noted without discrete mass. These changes are stable from the prior exam. No other focal abnormality is noted. Musculoskeletal: No chest wall mass or suspicious bone lesions identified. IMPRESSION: Scattered calcified and noncalcified nodules throughout both lungs. The majority of the noncalcified nodules measure less than 3 mm. No specific follow-up is recommended. Previously seen 6 mm nodule in the left upper lobe as resolved in the interval. Stable adrenal thickening. Aortic Atherosclerosis (ICD10-I70.0) and Emphysema (ICD10-J43.9). Electronically Signed   By: Oneil Devonshire M.D.   On: 04/28/2024 03:35   DG Chest 2 View Result Date: 04/28/2024 CLINICAL DATA:  Dyspnea, chest pain EXAM: CHEST - 2 VIEW COMPARISON:  01/07/2024 save for FINDINGS: Lungs are well expanded, symmetric, and clear save for a benign calcified granuloma at the right lung base. No pneumothorax or pleural effusion. Cardiac size within normal limits. Pulmonary vascularity is normal. Osseous structures are age-appropriate. No acute bone abnormality. IMPRESSION: 1. No active cardiopulmonary disease. Electronically Signed   By: Dorethia Molt M.D.   On: 04/28/2024 00:37   (Echo, Carotid, EGD, Colonoscopy, ERCP)    Subjective: Patient seen in the morning rounds.  Occasional wheezing present but mostly on room air.  Was very excited with plan to go home.   Discharge Exam: Vitals:   05/02/24 0754 05/02/24 0848  BP:  (!) 163/42  Pulse:  (!) 55  Resp:  18  Temp:  (!) 97.5 F (36.4 C)  SpO2: 96% 92%   Vitals:   05/02/24 0559 05/02/24 0750 05/02/24 0754 05/02/24 0848  BP:    (!) 163/42  Pulse:    (!) 55  Resp:    18  Temp:    (!) 97.5 F (36.4 C)  TempSrc:    Oral  SpO2:  96% 96% 92%  Weight: 88.6 kg     Height:        General: Pt is alert, awake, not in acute distress Cardiovascular: RRR, S1/S2 +, no rubs, no gallops Respiratory: CTA bilaterally, no wheezing, no rhonchi, occasional expiratory wheezes.  On  room air. Abdominal: Soft, NT, ND, bowel sounds + Extremities: 1+ bilateral pedal edema, no cyanosis    The results of significant diagnostics from this hospitalization (including imaging, microbiology, ancillary and laboratory) are listed below for reference.     Microbiology: No results found for this or any previous visit (from the past 240 hours).   Labs: BNP (last 3 results) Recent Labs    01/03/24 1300 04/27/24 0013  BNP 518.1* 757.7*   Basic Metabolic Panel: Recent Labs  Lab 04/28/24 0013 04/29/24 0333 04/30/24 0439 05/01/24 0337  NA 138 136 134* 137  K  3.4* 4.1 4.5 4.2  CL 100 100 98 101  CO2 27 25 26 27   GLUCOSE 97 336* 399* 260*  BUN 47* 63* 78* 88*  CREATININE 5.81* 6.07* 5.98* 6.01*  CALCIUM  10.5* 9.9 9.8 9.5  MG  --  2.5*  --  2.4  PHOS  --  5.8*  --  5.5*   Liver Function Tests: Recent Labs  Lab 04/29/24 0333 05/01/24 0337  AST 23 23  ALT 14 18  ALKPHOS 64 63  BILITOT 0.6 0.6  PROT 5.5* 5.7*  ALBUMIN  2.4* 2.4*   No results for input(s): LIPASE, AMYLASE in the last 168 hours. No results for input(s): AMMONIA in the last 168 hours. CBC: Recent Labs  Lab 04/28/24 0013 04/29/24 0333 04/30/24 0439 05/01/24 0337  WBC 10.3 13.6* 13.4* 13.3*  HGB 10.4* 9.5* 9.8* 9.6*  HCT 32.8* 29.5* 29.9* 29.9*  MCV 101.5* 100.0 99.7 100.3*  PLT 254 222 223 215   Cardiac Enzymes: No results for input(s): CKTOTAL, CKMB, CKMBINDEX, TROPONINI in the last 168 hours. BNP: Invalid input(s): POCBNP CBG: Recent Labs  Lab 05/01/24 0611 05/01/24 1203 05/01/24 1638 05/01/24 2057 05/02/24 0556  GLUCAP 171* 316* 384* 454* 148*   D-Dimer No results for input(s): DDIMER in the last 72 hours. Hgb A1c No results for input(s): HGBA1C in the last 72 hours. Lipid Profile No results for input(s): CHOL, HDL, LDLCALC, TRIG, CHOLHDL, LDLDIRECT in the last 72 hours. Thyroid  function studies No results for input(s): TSH, T4TOTAL,  T3FREE, THYROIDAB in the last 72 hours.  Invalid input(s): FREET3 Anemia work up No results for input(s): VITAMINB12, FOLATE, FERRITIN, TIBC, IRON, RETICCTPCT in the last 72 hours. Urinalysis    Component Value Date/Time   COLORURINE YELLOW 01/06/2024 1002   APPEARANCEUR HAZY (A) 01/06/2024 1002   LABSPEC 1.022 01/06/2024 1002   PHURINE 5.0 01/06/2024 1002   GLUCOSEU >=500 (A) 01/06/2024 1002   GLUCOSEU 500 (?) 01/30/2010 1056   HGBUR NEGATIVE 01/06/2024 1002   BILIRUBINUR NEGATIVE 01/06/2024 1002   KETONESUR NEGATIVE 01/06/2024 1002   PROTEINUR >=300 (A) 01/06/2024 1002   UROBILINOGEN 0.2 10/04/2014 1807   NITRITE NEGATIVE 01/06/2024 1002   LEUKOCYTESUR NEGATIVE 01/06/2024 1002   Sepsis Labs Recent Labs  Lab 04/28/24 0013 04/29/24 0333 04/30/24 0439 05/01/24 0337  WBC 10.3 13.6* 13.4* 13.3*   Microbiology No results found for this or any previous visit (from the past 240 hours).   Time coordinating discharge: 32 minutes  SIGNED:   Renato Applebaum, MD  [facility] 05/02/2024, 2:39 PM

## 2024-05-03 ENCOUNTER — Telehealth: Payer: Self-pay | Admitting: Nurse Practitioner

## 2024-05-03 ENCOUNTER — Telehealth: Payer: Self-pay

## 2024-05-03 NOTE — Telephone Encounter (Signed)
 Apolinar with [facility] is calling to follow up on orders that have been faxed multiple times- they are orders about changing some parameters.  She is going to fax them again today. Given fax number.

## 2024-05-03 NOTE — Transitions of Care (Post Inpatient/ED Visit) (Signed)
   05/03/2024  Name: [name] MRN: [id number] DOB: [birth year]  error

## 2024-05-03 NOTE — Telephone Encounter (Signed)
 Casimir Aldona BRAVO, RN    02/16/24  3:12 PM Note Spoke with Jorene, home health nurse and advised Dr Lavona agrees with parameters to call if SBP <90 or >160, and/or if symptomatic and to keep HR >50.  [name] verbalizes understanding and thanked Charity fundraiser for the callback.       02/16/24  3:07 PM Casimir Aldona BRAVO, RN contacted [name]/Home health nurse _____________________________________________________    I spoke w Mitzie at [facility].  She told me what needs signed is an orders page for parameters for BP/HR that were given in May.  They had been using the previous fax number - current fax number provided.  She will resend to Dr. Denver attention.

## 2024-05-03 NOTE — Transitions of Care (Post Inpatient/ED Visit) (Signed)
 05/04/2024  Name: [name] MRN: [id number] DOB: 08/05/1946  Today's TOC FU Call Status: Today's TOC FU Call Status:: Successful TOC FU Call Completed TOC FU Call Complete Date: 05/03/24 Patient's Name and Date of Birth confirmed.  Transition Care Management Follow-up Telephone Call Date of Discharge: 05/02/24 Discharge Facility: [facility]) Type of Discharge: Inpatient Admission Primary Inpatient Discharge Diagnosis:: Acute exacerbation of chronic obstructive pulmonary disease How have you been since you were released from the hospital?: Better (Patient states she has done breathing treatment which is helping her breathing.  patient states her legs are swollen. 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 Patient Questions/Concerns Addressed: Other: (Discussed with patient need for hospital follow up with primary care provider. Advised patient that primary care provider would be the one to refer patient to specialist regaridng her leg/knee concerns.)  Items Reviewed: Did you receive and understand the discharge instructions provided?: Yes Any new allergies since your discharge?: No Dietary orders reviewed?: Yes Type of Diet Ordered:: 1200 renal diet Do you have support at home?: Yes People in Home [RPT]: spouse Name of Support/Comfort Primary Source: Avarie Tavano  Medications Reviewed Today: Medications Reviewed Today     Reviewed by Rushton Early E, RN (Registered Nurse) on 05/03/24 at 1617  Med List Status: <None>   Medication Order Taking? Sig Documenting Provider Last Dose Status Informant  acetaminophen  (TYLENOL ) 500 MG tablet [id number] Yes Take 1,000 mg by mouth 2 (two) times daily. [provider]  Active Self, Pharmacy Records  amLODipine   (NORVASC ) 10 MG tablet [id number] Yes Take 1 tablet (10 mg total) by mouth daily. Arlon Carliss ORN, DO  Active Self, Pharmacy Records  aspirin  EC 81 MG tablet [id number] Yes Take 1 tablet (81 mg total) by mouth daily. Swallow whole. Arlon Carliss ORN, DO  Active Self, Pharmacy Records  busPIRone  (BUSPAR ) 5 MG tablet [id number] Yes Take 1 tablet (5 mg total) by mouth 2 (two) times daily. Rhyne, [name] J, PA-C  Active Self, Pharmacy Records  calcitRIOL  (ROCALTROL ) 0.5 MCG capsule [id number] Yes Take 0.5 mcg by mouth in the morning. [provider]  Active Self, Pharmacy Records  cloNIDine  (CATAPRES ) 0.1 MG tablet [id number] Yes Take 1 tablet (0.1 mg total) by mouth at bedtime. Arlon Carliss ORN, DO  Active Self, Pharmacy Records  Fluticasone -Umeclidin-Vilant (TRELEGY ELLIPTA ) 200-62.5-25 MCG/ACT AEPB [id number] Yes Inhale 1 puff into the lungs daily. Mannam, Praveen, MD  Active Self, Pharmacy Records  furosemide  (LASIX ) 40 MG tablet [id number] Yes Take 80 mg by mouth 2 (two) times daily. [provider]  Active Self, Pharmacy Records           Med Note (SATTERFIELD, DARIUS E   Tue Mar 20, 2024  8:03 PM)    hydrALAZINE  (APRESOLINE ) 100 MG tablet [id number] Yes Take 1 tablet (100 mg total) by mouth every 8 (eight) hours. Daneen Damien BROCKS, NP  Active Self, Pharmacy Records  insulin  aspart (NOVOLOG  FLEXPEN) 100 UNIT/ML FlexPen [id number] Yes Inject 10-14 Units into the skin 3 (three) times daily before meals. Trixie File, MD  Active Self, Pharmacy Records  insulin  degludec (TRESIBA  FLEXTOUCH) 200 UNIT/ML FlexTouch Pen [id number] Yes Inject 40 Units into the skin daily. Arlon Carliss ORN, DO  Active Self, Pharmacy Records  ipratropium-albuterol  (DUONEB) 0.5-2.5 (  3) MG/3ML SOLN [id number] Yes Take 3 mLs by nebulization every 4 (four) hours as needed (Shortness of breath or Wheezing). Ghimire, Kuber, MD  Active   isosorbide  mononitrate (IMDUR ) 60 MG 24 hr tablet [id number] Yes Take 1 tablet (60 mg total)  by mouth daily. Arlon Carliss ORN, DO  Active Self, Pharmacy Records  metoprolol  tartrate (LOPRESSOR ) 25 MG tablet [id number] Yes Take 1 tablet (25 mg total) by mouth 2 (two) times daily. Daneen Damien BROCKS, NP  Active Self, Pharmacy Records  Omega-3 Fatty Acids (FISH OIL) 1000 MG CAPS [id number] Yes Take 1,000 mg by mouth in the morning and at bedtime. Bethanie Cough, PA-C  Active Self, Pharmacy Records  pantoprazole  (PROTONIX ) 40 MG tablet [id number] Yes Take 1 tablet (40 mg total) by mouth 2 (two) times daily. Raenelle Coria, MD  Active   Potassium Chloride  ER 20 MEQ TBCR [id number] Yes Take 2 tablets (40 mEq total) by mouth every other day. Raenelle Coria, MD  Active Self, Pharmacy Records  rOPINIRole  (REQUIP ) 0.25 MG tablet [id number] Yes Take 1 tablet (0.25 mg total) by mouth at bedtime. Take 0.25 mg by mouth at bedtime. Paseda, Folashade R, FNP  Active Self, Pharmacy Records  rosuvastatin  (CRESTOR ) 10 MG tablet [id number] Yes Take 1 tablet (10 mg total) by mouth daily. West, Katlyn D, NP  Active Self, Pharmacy Records            Home Care and Equipment/Supplies: Were Home Health Services Ordered?: No Any new equipment or medical supplies ordered?: Yes Name of Medical supply agency?: nebulizer from [employer] Were you able to get the equipment/medical supplies?: Yes Do you have any questions related to the use of the equipment/supplies?: No (patient states her daughter assisted her with setting up her nebulizer on yesterday evening and she has been using it as instructed by the doctor.)  Functional Questionnaire: Do you need assistance with bathing/showering or dressing?: No Do you need assistance with meal preparation?: No Do you need assistance with eating?: No Do you have difficulty maintaining continence: No Do you need assistance with getting out of bed/getting out of a chair/moving?: No Do you have difficulty managing or taking your medications?: No  Follow up appointments reviewed: PCP  Follow-up appointment confirmed?: Yes Date of PCP follow-up appointment?: 05/08/24 Follow-up Provider: Nell Piety, FNP Specialist Hospital Follow-up appointment confirmed?: Yes Date of Specialist follow-up appointment?: 05/09/24 Follow-Up Specialty Provider:: Dr. Dennise  nephrology Do you need transportation to your follow-up appointment?: No Do you understand care options if your condition(s) worsen?: Yes-patient verbalized understanding  SDOH Interventions Today    Flowsheet Row Most Recent Value  SDOH Interventions   Food Insecurity Interventions Intervention Not Indicated  Housing Interventions Intervention Not Indicated  Transportation Interventions Intervention Not Indicated  Utilities Interventions Intervention Not Indicated    Goals Addressed             This Visit's Progress    VBCI Transitions of Care (TOC) Care Plan       Problems:  Recent Hospitalization for treatment of Acute exacerbation of chronic obstructive pulmonary disease  Knowledge Deficit Related to COPD management and Medication management barrier    Goal:  Over the next 30 days, the patient will not experience hospital readmission  Interventions:  Transitions of Care: Doctor Visits  - discussed the importance of doctor visits Reviewed discharge summary with patient Reviewed upcoming provider visits Medications reviewed and compliance discussed Discussed and offered referral to practice pharmacist for medication management Advised to elevate legs when sitting /  standing to help with swelling Message sent to primary care provider regarding emergency inhaler.  Patient states she was told the doctor was going to prescribe an emergency inhaler for her prior to discharge.  Patient states she didn't have a prescribed inhaler at her pharmacy Referral sent to practice pharmacist for medication management Reviewed use of nebulizer and confirm patient understands how and when to use it using teach back  method.  Message sent to endocrinologist informing her patients stated her glucometer is broken and she needs a new one.  Advised to notify provider for any new and/ or ongoing symptoms.  Advised patient to talk with her primary care about a referral to the orthopedic doctor regarding the cyst on her knee.  Discussed COPD signs/ symptoms. Advised to notify provider for mild to moderate symptoms and to seek emergency medical services for severe breathing symptoms.  Education article sent to patient on COPD.  Chart encounter routed to patients primary care provider.   Patient Self Care Activities:  Attend all scheduled provider appointments Call pharmacy for medication refills 3-7 days in advance of running out of medications Call provider office for new concerns or questions  Take medications as prescribed   identify and remove indoor air pollutants limit outdoor activity during cold weather listen for public air quality announcements every day follow rescue plan if symptoms flare-up keep follow-up appointments: with providers Elevate legs when sitting and/ or lying down. Review education article on COPD sent to you in Mychart.    Plan:  Telephone follow up appointment with care management team member scheduled for:  05/09/24 at 3:30 pm        Discussed and offered 30 day TOC program.  Patient verbally agreed.  The patient has been provided with contact information for the care management team and has been advised to call with any health -related questions or concerns.  The patient verbalized understanding with current plan of care.  The patient is directed to their insurance card regarding availability of benefits coverage.    Arvin Seip RN, BSN, CCM [employer], Population Health Case Manager Phone: [phone]

## 2024-05-03 NOTE — Telephone Encounter (Signed)
 [name] from [facility] requesting a c/b in regards to some orders that were faxed over.

## 2024-05-03 NOTE — Patient Instructions (Addendum)
 Visit Information  Thank you for taking time to visit with me today. Please don't hesitate to contact me if I can be of assistance to you before our next scheduled telephone appointment.  Our next appointment is by telephone on 05/09/24 at 3:30pm  Following is a copy of your care plan:   Goals Addressed             This Visit's Progress    VBCI Transitions of Care (TOC) Care Plan       Problems:  Recent Hospitalization for treatment of Acute exacerbation of chronic obstructive pulmonary disease  Knowledge Deficit Related to COPD management and Medication management barrier    Goal:  Over the next 30 days, the patient will not experience hospital readmission  Interventions:  Transitions of Care: Doctor Visits  - discussed the importance of doctor visits Reviewed discharge summary with patient Reviewed upcoming provider visits Medications reviewed and compliance discussed Discussed and offered referral to practice pharmacist for medication management Advised to elevate legs when sitting / standing to help with swelling Message sent to primary care provider regarding emergency inhaler.  Patient states she was told the doctor was going to prescribe an emergency inhaler for her prior to discharge.  Patient states she didn't have a prescribed inhaler at her pharmacy Referral sent to practice pharmacist for medication management Reviewed use of nebulizer and confirm patient understands how and when to use it using teach back method.  Message sent to endocrinologist informing her patients stated her glucometer is broken and she needs a new one.  Advised to notify provider for any new and/ or ongoing symptoms.  Advised patient to talk with her primary care about a referral to the orthopedic doctor regarding the cyst on her knee.  Discussed COPD signs/ symptoms. Advised to notify provider for mild to moderate symptoms and to seek emergency medical services for severe breathing symptoms.   Education article sent to patient on COPD.  Chart encounter routed to patients primary care provider.   Patient Self Care Activities:  Attend all scheduled provider appointments Call pharmacy for medication refills 3-7 days in advance of running out of medications Call provider office for new concerns or questions  Take medications as prescribed   identify and remove indoor air pollutants limit outdoor activity during cold weather listen for public air quality announcements every day follow rescue plan if symptoms flare-up keep follow-up appointments: with providers Elevate legs when sitting and/ or lying down. Review education article on COPD sent to you in Mychart.    Plan:  Telephone follow up appointment with care management team member scheduled for:  05/09/24 at 3:30 pm        Patient verbalizes understanding of instructions and care plan provided today and agrees to view in MyChart. Active MyChart status and patient understanding of how to access instructions and care plan via MyChart confirmed with patient.     The patient has been provided with contact information for the care management team and has been advised to call with any health related questions or concerns.   Please call the care guide team at [phone] if you need to cancel or reschedule your appointment.   Please call the Suicide and Crisis Lifeline: 988 call 1-800-273-TALK (toll free, 24 hour hotline) if you are experiencing a Mental Health or Behavioral Health Crisis or need someone to talk to.    Arvin Seip RN, BSN, CCM [employer], Population Health Case Manager Phone: [phone]

## 2024-05-08 DIAGNOSIS — J449 Chronic obstructive pulmonary disease, unspecified: Secondary | ICD-10-CM | POA: Diagnosis not present

## 2024-05-08 DIAGNOSIS — D631 Anemia in chronic kidney disease: Secondary | ICD-10-CM | POA: Diagnosis not present

## 2024-05-08 DIAGNOSIS — J441 Chronic obstructive pulmonary disease with (acute) exacerbation: Secondary | ICD-10-CM | POA: Diagnosis not present

## 2024-05-08 DIAGNOSIS — N185 Chronic kidney disease, stage 5: Secondary | ICD-10-CM | POA: Diagnosis not present

## 2024-05-08 DIAGNOSIS — M170 Bilateral primary osteoarthritis of knee: Secondary | ICD-10-CM | POA: Diagnosis not present

## 2024-05-08 DIAGNOSIS — M1711 Unilateral primary osteoarthritis, right knee: Secondary | ICD-10-CM | POA: Diagnosis not present

## 2024-05-08 DIAGNOSIS — I081 Rheumatic disorders of both mitral and tricuspid valves: Secondary | ICD-10-CM | POA: Diagnosis not present

## 2024-05-08 DIAGNOSIS — I10 Essential (primary) hypertension: Secondary | ICD-10-CM | POA: Diagnosis not present

## 2024-05-09 ENCOUNTER — Telehealth: Payer: Self-pay

## 2024-05-09 DIAGNOSIS — N185 Chronic kidney disease, stage 5: Secondary | ICD-10-CM | POA: Diagnosis not present

## 2024-05-09 DIAGNOSIS — N184 Chronic kidney disease, stage 4 (severe): Secondary | ICD-10-CM | POA: Diagnosis not present

## 2024-05-09 DIAGNOSIS — I770 Arteriovenous fistula, acquired: Secondary | ICD-10-CM | POA: Diagnosis not present

## 2024-05-09 DIAGNOSIS — I5030 Unspecified diastolic (congestive) heart failure: Secondary | ICD-10-CM | POA: Diagnosis not present

## 2024-05-09 DIAGNOSIS — N2581 Secondary hyperparathyroidism of renal origin: Secondary | ICD-10-CM | POA: Diagnosis not present

## 2024-05-09 DIAGNOSIS — D631 Anemia in chronic kidney disease: Secondary | ICD-10-CM | POA: Diagnosis not present

## 2024-05-09 DIAGNOSIS — I120 Hypertensive chronic kidney disease with stage 5 chronic kidney disease or end stage renal disease: Secondary | ICD-10-CM | POA: Diagnosis not present

## 2024-05-09 DIAGNOSIS — N186 End stage renal disease: Secondary | ICD-10-CM | POA: Diagnosis not present

## 2024-05-11 DIAGNOSIS — M1712 Unilateral primary osteoarthritis, left knee: Secondary | ICD-10-CM | POA: Diagnosis not present

## 2024-05-11 DIAGNOSIS — M170 Bilateral primary osteoarthritis of knee: Secondary | ICD-10-CM | POA: Diagnosis not present

## 2024-05-16 DIAGNOSIS — D631 Anemia in chronic kidney disease: Secondary | ICD-10-CM | POA: Diagnosis not present

## 2024-05-16 DIAGNOSIS — N186 End stage renal disease: Secondary | ICD-10-CM | POA: Diagnosis not present

## 2024-05-16 DIAGNOSIS — N2581 Secondary hyperparathyroidism of renal origin: Secondary | ICD-10-CM | POA: Diagnosis not present

## 2024-05-16 DIAGNOSIS — Z992 Dependence on renal dialysis: Secondary | ICD-10-CM | POA: Diagnosis not present

## 2024-05-16 DIAGNOSIS — D509 Iron deficiency anemia, unspecified: Secondary | ICD-10-CM | POA: Diagnosis not present

## 2024-05-18 DIAGNOSIS — Z992 Dependence on renal dialysis: Secondary | ICD-10-CM | POA: Diagnosis not present

## 2024-05-18 DIAGNOSIS — E1122 Type 2 diabetes mellitus with diabetic chronic kidney disease: Secondary | ICD-10-CM | POA: Diagnosis not present

## 2024-05-18 DIAGNOSIS — N186 End stage renal disease: Secondary | ICD-10-CM | POA: Diagnosis not present

## 2024-05-18 DIAGNOSIS — N2581 Secondary hyperparathyroidism of renal origin: Secondary | ICD-10-CM | POA: Diagnosis not present

## 2024-05-18 DIAGNOSIS — D631 Anemia in chronic kidney disease: Secondary | ICD-10-CM | POA: Diagnosis not present

## 2024-05-18 DIAGNOSIS — D509 Iron deficiency anemia, unspecified: Secondary | ICD-10-CM | POA: Diagnosis not present

## 2024-05-21 DIAGNOSIS — D509 Iron deficiency anemia, unspecified: Secondary | ICD-10-CM | POA: Diagnosis not present

## 2024-05-21 DIAGNOSIS — Z992 Dependence on renal dialysis: Secondary | ICD-10-CM | POA: Diagnosis not present

## 2024-05-21 DIAGNOSIS — D631 Anemia in chronic kidney disease: Secondary | ICD-10-CM | POA: Diagnosis not present

## 2024-05-21 DIAGNOSIS — E1121 Type 2 diabetes mellitus with diabetic nephropathy: Secondary | ICD-10-CM | POA: Diagnosis not present

## 2024-05-21 DIAGNOSIS — N2581 Secondary hyperparathyroidism of renal origin: Secondary | ICD-10-CM | POA: Diagnosis not present

## 2024-05-21 DIAGNOSIS — N186 End stage renal disease: Secondary | ICD-10-CM | POA: Diagnosis not present

## 2024-05-21 DIAGNOSIS — E876 Hypokalemia: Secondary | ICD-10-CM | POA: Diagnosis not present

## 2024-05-23 DIAGNOSIS — N2581 Secondary hyperparathyroidism of renal origin: Secondary | ICD-10-CM | POA: Diagnosis not present

## 2024-05-23 DIAGNOSIS — D509 Iron deficiency anemia, unspecified: Secondary | ICD-10-CM | POA: Diagnosis not present

## 2024-05-23 DIAGNOSIS — N186 End stage renal disease: Secondary | ICD-10-CM | POA: Diagnosis not present

## 2024-05-23 DIAGNOSIS — E876 Hypokalemia: Secondary | ICD-10-CM | POA: Diagnosis not present

## 2024-05-23 DIAGNOSIS — D631 Anemia in chronic kidney disease: Secondary | ICD-10-CM | POA: Diagnosis not present

## 2024-05-23 DIAGNOSIS — Z992 Dependence on renal dialysis: Secondary | ICD-10-CM | POA: Diagnosis not present

## 2024-05-25 ENCOUNTER — Ambulatory Visit: Admitting: Nurse Practitioner

## 2024-05-25 DIAGNOSIS — N186 End stage renal disease: Secondary | ICD-10-CM | POA: Diagnosis not present

## 2024-05-25 DIAGNOSIS — Z992 Dependence on renal dialysis: Secondary | ICD-10-CM | POA: Diagnosis not present

## 2024-05-25 DIAGNOSIS — E876 Hypokalemia: Secondary | ICD-10-CM | POA: Diagnosis not present

## 2024-05-25 DIAGNOSIS — D509 Iron deficiency anemia, unspecified: Secondary | ICD-10-CM | POA: Diagnosis not present

## 2024-05-25 DIAGNOSIS — N2581 Secondary hyperparathyroidism of renal origin: Secondary | ICD-10-CM | POA: Diagnosis not present

## 2024-05-25 DIAGNOSIS — D631 Anemia in chronic kidney disease: Secondary | ICD-10-CM | POA: Diagnosis not present

## 2024-05-28 DIAGNOSIS — N186 End stage renal disease: Secondary | ICD-10-CM | POA: Diagnosis not present

## 2024-05-28 DIAGNOSIS — N2581 Secondary hyperparathyroidism of renal origin: Secondary | ICD-10-CM | POA: Diagnosis not present

## 2024-05-28 DIAGNOSIS — Z992 Dependence on renal dialysis: Secondary | ICD-10-CM | POA: Diagnosis not present

## 2024-05-28 DIAGNOSIS — E876 Hypokalemia: Secondary | ICD-10-CM | POA: Diagnosis not present

## 2024-05-28 DIAGNOSIS — D631 Anemia in chronic kidney disease: Secondary | ICD-10-CM | POA: Diagnosis not present

## 2024-05-28 DIAGNOSIS — D509 Iron deficiency anemia, unspecified: Secondary | ICD-10-CM | POA: Diagnosis not present

## 2024-05-29 ENCOUNTER — Encounter: Payer: Self-pay | Admitting: Nephrology

## 2024-05-30 ENCOUNTER — Other Ambulatory Visit: Payer: Self-pay | Admitting: Nephrology

## 2024-05-30 DIAGNOSIS — N186 End stage renal disease: Secondary | ICD-10-CM | POA: Diagnosis not present

## 2024-05-30 DIAGNOSIS — N2581 Secondary hyperparathyroidism of renal origin: Secondary | ICD-10-CM | POA: Diagnosis not present

## 2024-05-30 DIAGNOSIS — E876 Hypokalemia: Secondary | ICD-10-CM | POA: Diagnosis not present

## 2024-05-30 DIAGNOSIS — D509 Iron deficiency anemia, unspecified: Secondary | ICD-10-CM | POA: Diagnosis not present

## 2024-05-30 DIAGNOSIS — Z9189 Other specified personal risk factors, not elsewhere classified: Secondary | ICD-10-CM

## 2024-05-30 DIAGNOSIS — Z992 Dependence on renal dialysis: Secondary | ICD-10-CM | POA: Diagnosis not present

## 2024-05-30 DIAGNOSIS — D631 Anemia in chronic kidney disease: Secondary | ICD-10-CM | POA: Diagnosis not present

## 2024-06-01 DIAGNOSIS — D509 Iron deficiency anemia, unspecified: Secondary | ICD-10-CM | POA: Diagnosis not present

## 2024-06-01 DIAGNOSIS — D631 Anemia in chronic kidney disease: Secondary | ICD-10-CM | POA: Diagnosis not present

## 2024-06-01 DIAGNOSIS — Z992 Dependence on renal dialysis: Secondary | ICD-10-CM | POA: Diagnosis not present

## 2024-06-01 DIAGNOSIS — N186 End stage renal disease: Secondary | ICD-10-CM | POA: Diagnosis not present

## 2024-06-01 DIAGNOSIS — N2581 Secondary hyperparathyroidism of renal origin: Secondary | ICD-10-CM | POA: Diagnosis not present

## 2024-06-01 DIAGNOSIS — E876 Hypokalemia: Secondary | ICD-10-CM | POA: Diagnosis not present

## 2024-06-04 DIAGNOSIS — N2581 Secondary hyperparathyroidism of renal origin: Secondary | ICD-10-CM | POA: Diagnosis not present

## 2024-06-04 DIAGNOSIS — D631 Anemia in chronic kidney disease: Secondary | ICD-10-CM | POA: Diagnosis not present

## 2024-06-04 DIAGNOSIS — N186 End stage renal disease: Secondary | ICD-10-CM | POA: Diagnosis not present

## 2024-06-04 DIAGNOSIS — E876 Hypokalemia: Secondary | ICD-10-CM | POA: Diagnosis not present

## 2024-06-04 DIAGNOSIS — Z992 Dependence on renal dialysis: Secondary | ICD-10-CM | POA: Diagnosis not present

## 2024-06-04 DIAGNOSIS — D509 Iron deficiency anemia, unspecified: Secondary | ICD-10-CM | POA: Diagnosis not present

## 2024-06-06 DIAGNOSIS — Z992 Dependence on renal dialysis: Secondary | ICD-10-CM | POA: Diagnosis not present

## 2024-06-06 DIAGNOSIS — D509 Iron deficiency anemia, unspecified: Secondary | ICD-10-CM | POA: Diagnosis not present

## 2024-06-06 DIAGNOSIS — N2581 Secondary hyperparathyroidism of renal origin: Secondary | ICD-10-CM | POA: Diagnosis not present

## 2024-06-06 DIAGNOSIS — N186 End stage renal disease: Secondary | ICD-10-CM | POA: Diagnosis not present

## 2024-06-06 DIAGNOSIS — D631 Anemia in chronic kidney disease: Secondary | ICD-10-CM | POA: Diagnosis not present

## 2024-06-06 DIAGNOSIS — E876 Hypokalemia: Secondary | ICD-10-CM | POA: Diagnosis not present

## 2024-06-08 DIAGNOSIS — N2581 Secondary hyperparathyroidism of renal origin: Secondary | ICD-10-CM | POA: Diagnosis not present

## 2024-06-08 DIAGNOSIS — N186 End stage renal disease: Secondary | ICD-10-CM | POA: Diagnosis not present

## 2024-06-08 DIAGNOSIS — E876 Hypokalemia: Secondary | ICD-10-CM | POA: Diagnosis not present

## 2024-06-08 DIAGNOSIS — Z992 Dependence on renal dialysis: Secondary | ICD-10-CM | POA: Diagnosis not present

## 2024-06-08 DIAGNOSIS — D509 Iron deficiency anemia, unspecified: Secondary | ICD-10-CM | POA: Diagnosis not present

## 2024-06-08 DIAGNOSIS — D631 Anemia in chronic kidney disease: Secondary | ICD-10-CM | POA: Diagnosis not present

## 2024-06-10 ENCOUNTER — Ambulatory Visit
Admission: RE | Admit: 2024-06-10 | Discharge: 2024-06-10 | Disposition: A | Discharge status: Home / Self Care | Source: Ambulatory Visit | Attending: Nephrology | Admitting: Nephrology

## 2024-06-10 DIAGNOSIS — I6602 Occlusion and stenosis of left middle cerebral artery: Secondary | ICD-10-CM | POA: Diagnosis not present

## 2024-06-10 DIAGNOSIS — Z9189 Other specified personal risk factors, not elsewhere classified: Secondary | ICD-10-CM

## 2024-06-10 DIAGNOSIS — G43909 Migraine, unspecified, not intractable, without status migrainosus: Secondary | ICD-10-CM | POA: Diagnosis not present

## 2024-06-11 DIAGNOSIS — N186 End stage renal disease: Secondary | ICD-10-CM | POA: Diagnosis not present

## 2024-06-11 DIAGNOSIS — D509 Iron deficiency anemia, unspecified: Secondary | ICD-10-CM | POA: Diagnosis not present

## 2024-06-11 DIAGNOSIS — Z992 Dependence on renal dialysis: Secondary | ICD-10-CM | POA: Diagnosis not present

## 2024-06-11 DIAGNOSIS — D631 Anemia in chronic kidney disease: Secondary | ICD-10-CM | POA: Diagnosis not present

## 2024-06-11 DIAGNOSIS — N2581 Secondary hyperparathyroidism of renal origin: Secondary | ICD-10-CM | POA: Diagnosis not present

## 2024-06-11 DIAGNOSIS — E876 Hypokalemia: Secondary | ICD-10-CM | POA: Diagnosis not present

## 2024-06-13 DIAGNOSIS — Z992 Dependence on renal dialysis: Secondary | ICD-10-CM | POA: Diagnosis not present

## 2024-06-13 DIAGNOSIS — N186 End stage renal disease: Secondary | ICD-10-CM | POA: Diagnosis not present

## 2024-06-13 DIAGNOSIS — E876 Hypokalemia: Secondary | ICD-10-CM | POA: Diagnosis not present

## 2024-06-13 DIAGNOSIS — N2581 Secondary hyperparathyroidism of renal origin: Secondary | ICD-10-CM | POA: Diagnosis not present

## 2024-06-13 DIAGNOSIS — D509 Iron deficiency anemia, unspecified: Secondary | ICD-10-CM | POA: Diagnosis not present

## 2024-06-13 DIAGNOSIS — D631 Anemia in chronic kidney disease: Secondary | ICD-10-CM | POA: Diagnosis not present

## 2024-06-15 DIAGNOSIS — D509 Iron deficiency anemia, unspecified: Secondary | ICD-10-CM | POA: Diagnosis not present

## 2024-06-15 DIAGNOSIS — Z992 Dependence on renal dialysis: Secondary | ICD-10-CM | POA: Diagnosis not present

## 2024-06-15 DIAGNOSIS — D631 Anemia in chronic kidney disease: Secondary | ICD-10-CM | POA: Diagnosis not present

## 2024-06-15 DIAGNOSIS — E876 Hypokalemia: Secondary | ICD-10-CM | POA: Diagnosis not present

## 2024-06-15 DIAGNOSIS — N186 End stage renal disease: Secondary | ICD-10-CM | POA: Diagnosis not present

## 2024-06-15 DIAGNOSIS — N2581 Secondary hyperparathyroidism of renal origin: Secondary | ICD-10-CM | POA: Diagnosis not present

## 2024-06-18 ENCOUNTER — Encounter: Payer: Self-pay | Admitting: Diagnostic Neuroimaging

## 2024-06-18 DIAGNOSIS — D509 Iron deficiency anemia, unspecified: Secondary | ICD-10-CM | POA: Diagnosis not present

## 2024-06-18 DIAGNOSIS — N186 End stage renal disease: Secondary | ICD-10-CM | POA: Diagnosis not present

## 2024-06-18 DIAGNOSIS — Z992 Dependence on renal dialysis: Secondary | ICD-10-CM | POA: Diagnosis not present

## 2024-06-18 DIAGNOSIS — N2581 Secondary hyperparathyroidism of renal origin: Secondary | ICD-10-CM | POA: Diagnosis not present

## 2024-06-18 DIAGNOSIS — E876 Hypokalemia: Secondary | ICD-10-CM | POA: Diagnosis not present

## 2024-06-18 DIAGNOSIS — D631 Anemia in chronic kidney disease: Secondary | ICD-10-CM | POA: Diagnosis not present

## 2024-06-20 DIAGNOSIS — N2581 Secondary hyperparathyroidism of renal origin: Secondary | ICD-10-CM | POA: Diagnosis not present

## 2024-06-20 DIAGNOSIS — E876 Hypokalemia: Secondary | ICD-10-CM | POA: Diagnosis not present

## 2024-06-20 DIAGNOSIS — E1122 Type 2 diabetes mellitus with diabetic chronic kidney disease: Secondary | ICD-10-CM | POA: Diagnosis not present

## 2024-06-20 DIAGNOSIS — Z23 Encounter for immunization: Secondary | ICD-10-CM | POA: Diagnosis not present

## 2024-06-20 DIAGNOSIS — D509 Iron deficiency anemia, unspecified: Secondary | ICD-10-CM | POA: Diagnosis not present

## 2024-06-20 DIAGNOSIS — Z992 Dependence on renal dialysis: Secondary | ICD-10-CM | POA: Diagnosis not present

## 2024-06-20 DIAGNOSIS — E1121 Type 2 diabetes mellitus with diabetic nephropathy: Secondary | ICD-10-CM | POA: Diagnosis not present

## 2024-06-20 DIAGNOSIS — N186 End stage renal disease: Secondary | ICD-10-CM | POA: Diagnosis not present

## 2024-06-22 DIAGNOSIS — E876 Hypokalemia: Secondary | ICD-10-CM | POA: Diagnosis not present

## 2024-06-22 DIAGNOSIS — N2581 Secondary hyperparathyroidism of renal origin: Secondary | ICD-10-CM | POA: Diagnosis not present

## 2024-06-22 DIAGNOSIS — D509 Iron deficiency anemia, unspecified: Secondary | ICD-10-CM | POA: Diagnosis not present

## 2024-06-22 DIAGNOSIS — N186 End stage renal disease: Secondary | ICD-10-CM | POA: Diagnosis not present

## 2024-06-22 DIAGNOSIS — Z992 Dependence on renal dialysis: Secondary | ICD-10-CM | POA: Diagnosis not present

## 2024-06-22 DIAGNOSIS — E1122 Type 2 diabetes mellitus with diabetic chronic kidney disease: Secondary | ICD-10-CM | POA: Diagnosis not present

## 2024-06-25 DIAGNOSIS — D509 Iron deficiency anemia, unspecified: Secondary | ICD-10-CM | POA: Diagnosis not present

## 2024-06-25 DIAGNOSIS — N186 End stage renal disease: Secondary | ICD-10-CM | POA: Diagnosis not present

## 2024-06-25 DIAGNOSIS — E1122 Type 2 diabetes mellitus with diabetic chronic kidney disease: Secondary | ICD-10-CM | POA: Diagnosis not present

## 2024-06-25 DIAGNOSIS — Z992 Dependence on renal dialysis: Secondary | ICD-10-CM | POA: Diagnosis not present

## 2024-06-25 DIAGNOSIS — E876 Hypokalemia: Secondary | ICD-10-CM | POA: Diagnosis not present

## 2024-06-25 DIAGNOSIS — N2581 Secondary hyperparathyroidism of renal origin: Secondary | ICD-10-CM | POA: Diagnosis not present

## 2024-06-26 DIAGNOSIS — Z23 Encounter for immunization: Secondary | ICD-10-CM | POA: Diagnosis not present

## 2024-06-26 DIAGNOSIS — N186 End stage renal disease: Secondary | ICD-10-CM | POA: Diagnosis not present

## 2024-06-26 DIAGNOSIS — Z992 Dependence on renal dialysis: Secondary | ICD-10-CM | POA: Diagnosis not present

## 2024-06-26 DIAGNOSIS — L299 Pruritus, unspecified: Secondary | ICD-10-CM | POA: Diagnosis not present

## 2024-06-26 DIAGNOSIS — Z1331 Encounter for screening for depression: Secondary | ICD-10-CM | POA: Diagnosis not present

## 2024-06-26 DIAGNOSIS — M1711 Unilateral primary osteoarthritis, right knee: Secondary | ICD-10-CM | POA: Diagnosis not present

## 2024-06-26 DIAGNOSIS — Z1389 Encounter for screening for other disorder: Secondary | ICD-10-CM | POA: Diagnosis not present

## 2024-06-26 DIAGNOSIS — Z1159 Encounter for screening for other viral diseases: Secondary | ICD-10-CM | POA: Diagnosis not present

## 2024-06-26 DIAGNOSIS — G5793 Unspecified mononeuropathy of bilateral lower limbs: Secondary | ICD-10-CM | POA: Diagnosis not present

## 2024-06-26 DIAGNOSIS — E1165 Type 2 diabetes mellitus with hyperglycemia: Secondary | ICD-10-CM | POA: Diagnosis not present

## 2024-06-26 DIAGNOSIS — Z0000 Encounter for general adult medical examination without abnormal findings: Secondary | ICD-10-CM | POA: Diagnosis not present

## 2024-06-26 DIAGNOSIS — J449 Chronic obstructive pulmonary disease, unspecified: Secondary | ICD-10-CM | POA: Diagnosis not present

## 2024-06-27 DIAGNOSIS — N2581 Secondary hyperparathyroidism of renal origin: Secondary | ICD-10-CM | POA: Diagnosis not present

## 2024-06-27 DIAGNOSIS — Z992 Dependence on renal dialysis: Secondary | ICD-10-CM | POA: Diagnosis not present

## 2024-06-27 DIAGNOSIS — E876 Hypokalemia: Secondary | ICD-10-CM | POA: Diagnosis not present

## 2024-06-27 DIAGNOSIS — D509 Iron deficiency anemia, unspecified: Secondary | ICD-10-CM | POA: Diagnosis not present

## 2024-06-27 DIAGNOSIS — N186 End stage renal disease: Secondary | ICD-10-CM | POA: Diagnosis not present

## 2024-06-27 DIAGNOSIS — E1122 Type 2 diabetes mellitus with diabetic chronic kidney disease: Secondary | ICD-10-CM | POA: Diagnosis not present

## 2024-06-28 ENCOUNTER — Encounter: Payer: Self-pay | Admitting: Surgery

## 2024-06-29 DIAGNOSIS — Z992 Dependence on renal dialysis: Secondary | ICD-10-CM | POA: Diagnosis not present

## 2024-06-29 DIAGNOSIS — D509 Iron deficiency anemia, unspecified: Secondary | ICD-10-CM | POA: Diagnosis not present

## 2024-06-29 DIAGNOSIS — N2581 Secondary hyperparathyroidism of renal origin: Secondary | ICD-10-CM | POA: Diagnosis not present

## 2024-06-29 DIAGNOSIS — E876 Hypokalemia: Secondary | ICD-10-CM | POA: Diagnosis not present

## 2024-06-29 DIAGNOSIS — N186 End stage renal disease: Secondary | ICD-10-CM | POA: Diagnosis not present

## 2024-06-29 DIAGNOSIS — E1122 Type 2 diabetes mellitus with diabetic chronic kidney disease: Secondary | ICD-10-CM | POA: Diagnosis not present

## 2024-07-02 DIAGNOSIS — E1122 Type 2 diabetes mellitus with diabetic chronic kidney disease: Secondary | ICD-10-CM | POA: Diagnosis not present

## 2024-07-02 DIAGNOSIS — E876 Hypokalemia: Secondary | ICD-10-CM | POA: Diagnosis not present

## 2024-07-02 DIAGNOSIS — Z992 Dependence on renal dialysis: Secondary | ICD-10-CM | POA: Diagnosis not present

## 2024-07-02 DIAGNOSIS — N2581 Secondary hyperparathyroidism of renal origin: Secondary | ICD-10-CM | POA: Diagnosis not present

## 2024-07-02 DIAGNOSIS — N186 End stage renal disease: Secondary | ICD-10-CM | POA: Diagnosis not present

## 2024-07-02 DIAGNOSIS — D509 Iron deficiency anemia, unspecified: Secondary | ICD-10-CM | POA: Diagnosis not present

## 2024-07-03 DIAGNOSIS — J449 Chronic obstructive pulmonary disease, unspecified: Secondary | ICD-10-CM | POA: Diagnosis not present

## 2024-07-03 DIAGNOSIS — N186 End stage renal disease: Secondary | ICD-10-CM | POA: Diagnosis not present

## 2024-07-03 DIAGNOSIS — Z992 Dependence on renal dialysis: Secondary | ICD-10-CM | POA: Diagnosis not present

## 2024-07-03 DIAGNOSIS — E782 Mixed hyperlipidemia: Secondary | ICD-10-CM | POA: Diagnosis not present

## 2024-07-03 DIAGNOSIS — E1165 Type 2 diabetes mellitus with hyperglycemia: Secondary | ICD-10-CM | POA: Diagnosis not present

## 2024-07-04 DIAGNOSIS — Z992 Dependence on renal dialysis: Secondary | ICD-10-CM | POA: Diagnosis not present

## 2024-07-04 DIAGNOSIS — E876 Hypokalemia: Secondary | ICD-10-CM | POA: Diagnosis not present

## 2024-07-04 DIAGNOSIS — E1122 Type 2 diabetes mellitus with diabetic chronic kidney disease: Secondary | ICD-10-CM | POA: Diagnosis not present

## 2024-07-04 DIAGNOSIS — D509 Iron deficiency anemia, unspecified: Secondary | ICD-10-CM | POA: Diagnosis not present

## 2024-07-04 DIAGNOSIS — N2581 Secondary hyperparathyroidism of renal origin: Secondary | ICD-10-CM | POA: Diagnosis not present

## 2024-07-04 DIAGNOSIS — N186 End stage renal disease: Secondary | ICD-10-CM | POA: Diagnosis not present

## 2024-07-05 ENCOUNTER — Ambulatory Visit (INDEPENDENT_AMBULATORY_CARE_PROVIDER_SITE_OTHER): Admitting: Internal Medicine

## 2024-07-05 ENCOUNTER — Encounter: Payer: Self-pay | Admitting: Internal Medicine

## 2024-07-05 ENCOUNTER — Other Ambulatory Visit: Payer: Self-pay | Admitting: Internal Medicine

## 2024-07-05 VITALS — BP 128/70 | HR 67 | Ht 66.0 in | Wt 175.4 lb

## 2024-07-05 DIAGNOSIS — E66811 Obesity, class 1: Secondary | ICD-10-CM | POA: Diagnosis not present

## 2024-07-05 DIAGNOSIS — L84 Corns and callosities: Secondary | ICD-10-CM | POA: Diagnosis not present

## 2024-07-05 DIAGNOSIS — M205X1 Other deformities of toe(s) (acquired), right foot: Secondary | ICD-10-CM | POA: Diagnosis not present

## 2024-07-05 DIAGNOSIS — I739 Peripheral vascular disease, unspecified: Secondary | ICD-10-CM | POA: Diagnosis not present

## 2024-07-05 DIAGNOSIS — E785 Hyperlipidemia, unspecified: Secondary | ICD-10-CM

## 2024-07-05 DIAGNOSIS — E1151 Type 2 diabetes mellitus with diabetic peripheral angiopathy without gangrene: Secondary | ICD-10-CM | POA: Diagnosis not present

## 2024-07-05 DIAGNOSIS — N184 Chronic kidney disease, stage 4 (severe): Secondary | ICD-10-CM

## 2024-07-05 DIAGNOSIS — Z794 Long term (current) use of insulin: Secondary | ICD-10-CM

## 2024-07-05 DIAGNOSIS — E1122 Type 2 diabetes mellitus with diabetic chronic kidney disease: Secondary | ICD-10-CM

## 2024-07-05 DIAGNOSIS — G629 Polyneuropathy, unspecified: Secondary | ICD-10-CM | POA: Diagnosis not present

## 2024-07-05 DIAGNOSIS — E1169 Type 2 diabetes mellitus with other specified complication: Secondary | ICD-10-CM | POA: Diagnosis not present

## 2024-07-05 DIAGNOSIS — M19071 Primary osteoarthritis, right ankle and foot: Secondary | ICD-10-CM | POA: Diagnosis not present

## 2024-07-05 DIAGNOSIS — M205X2 Other deformities of toe(s) (acquired), left foot: Secondary | ICD-10-CM | POA: Diagnosis not present

## 2024-07-05 DIAGNOSIS — M19072 Primary osteoarthritis, left ankle and foot: Secondary | ICD-10-CM | POA: Diagnosis not present

## 2024-07-05 MED ORDER — ACCU-CHEK GUIDE W/DEVICE KIT: PACK | 0 refills | Status: AC

## 2024-07-05 MED ORDER — GLUCOSE BLOOD VI STRP
ORAL_STRIP | 3 refills | Status: AC
Start: 2024-07-05 — End: ?

## 2024-07-05 MED ORDER — ACCU-CHEK SOFTCLIX LANCETS MISC
3 refills | Status: AC
Start: 2024-07-05 — End: ?

## 2024-07-05 NOTE — Patient Instructions (Addendum)
 Please decrease: - Tresiba  20 units daily - Novolog  5-8 units 2x day before meals  Please return in 3 months with your sugar log.

## 2024-07-05 NOTE — Progress Notes (Signed)
 Patient ID: [name], female   DOB: [birth year], 78 y.o.   MRN: [id number]   HPI: [name] is a 78 y.o.-year-old female, initially referred by her cardiologist, Dr.Hochrein, presenting for follow-up for DM2, dx in ~2000, insulin -dependent since ~2005, uncontrolled, with complications (PVD - s/p stents and fem-pop bypass; h/o CVA x3; ESRD - on HD MWF; DR; PN). She saw my colleague, Dr. Kassie many years ago.  Last visit with me 4 months ago.  Interim history: No increased urination, nausea, chest pain.   She is overwhelmed by the situation at home (husband with dementia) and also with her own medical care. Since last visit, she was admitted 03/20/2024 with acute kidney injury (creatinine increased from 4.8 to 8.2).  Glucose was elevated then, at 344.  She was also admitted with hypertensive urgency with shortness of breath (BP 200/55) on 04/27/2024.  She was started on dialysis 3 times a week 05/16/2024. She established care with [facility] - she was seen today. She describes a low blood sugar episode (39) at night >> decrease the dose of Lantus  since then  Reviewed HbA1c levels: Lab Results  Component Value Date   HGBA1C 8.2 (A) 03/05/2024   HGBA1C 7.3 (A) 10/31/2023   HGBA1C 8.9 (H) 08/08/2023   HGBA1C 8.4 (A) 07/04/2023   HGBA1C 7.7 (A) 02/07/2023   HGBA1C 7.6 (H) 08/04/2022   HGBA1C 7.6 (A) 05/31/2022   HGBA1C 7.6 (A) 01/22/2022   HGBA1C 7.1 (H) 11/17/2021   HGBA1C 8.5 (H) 08/04/2021  02/07/2023: HbA1c calculated from fructosamine is 6.13%, which is excellent. 05/31/2022: HbA1c calc. From fructosamine 6.5% 01/26/2021: HbA1c calculated from fructosamine is 7.47%, higher. 09/22/2020: HbA1c calculated from fructosamine is much better than the directly measured HbA1c, at 6.25% 05/21/2020: HbA1c calculated from fructosamine 7.3%, much better than the directly measured one, and stable from before 01/14/2020: HbA1c calculated from fructosamine 7.3% 10/08/2019: HbA1c calculated from  fructosamine: 6.9%  She was previously on: - Novolog  70/30 35 units before breakfast and 30 units before dinner- injecting hips and arms due to abdominal pain - Farxiga  5 mg in am - started 06/2019 >> 10 mg daily - increased 09/2019  Currently on: - Tresiba  40 >> 28 units daily, decreased 1 week ago - Novolog : 10-14 units 2x day before meals >> 8-14 units for all meals    She could not tolerate Ozempic  (started 11/2018) due to nausea, vomiting, abdominal pain-stopped 04/2019. She was on Invokana  >> intolerance 2/2 weight loss and dehydration >> hospitalization. She was on Metformin  and sulfonylurea at diagnosis. She was also on Januvia  at the same time with Invokana  >> stopped along with Invokana .  She was then on Farxiga , which was stopped by nephrology in 2025.  Pt was not checking sugars now- no meter (declines a CGM): - am: 79-105 >> 80-100 >> 71-121, 152 >> 96-157 >> 97-130s - 2h after b'fast: 326 >> n/c >> 326 (10/2019) >> n/c - before lunch: 124-172 >> n/c >> 80s-100s >> n/c - 2h after lunch: n/c >> 250 >> n/c >> 228 >> n/c - before dinner: n/c >> 180 >> <140 >> 76 >> n/c - 2h after dinner: 247 >> 196- 201 >> 172 >> n/c  - bedtime: n/c >> 178 >> n/c >> 39 - nighttime: 68  - seldom >> 57 (took Novolog  and skipped dinner!!!), 129 >> n/c Lowest sugar was 57 >> ... 96; she has hypoglycemia awareness at 80. Highest sugar was 335 (no medications) >> .SABRASABRA 157  Glucometer: Livongo >>  One Touch Verio  Pt's meals are: - Breakfast: eggs or cheese half a sandwich - Lunch: half a sandwich, apple - Dinner 9-9:30 pm: chicken, veggies - Snacks: stopped cheese; now apples and pickles, bedtime snack: orange or apple  -+ End-stage renal disease, now on HD MWF-sees nephrology, last BUN/creatinine:  Lab Results  Component Value Date   BUN 88 (H) 05/01/2024   BUN 78 (H) 04/30/2024   CREATININE 6.01 (H) 05/01/2024   CREATININE 5.98 (H) 04/30/2024  01/13/2024: GFR 11 She was taken off  olmesartan  40 and Farxiga .  -+ HL; last set of lipids: Lab Results  Component Value Date   CHOL 249 (H) 08/08/2023   HDL 57 08/08/2023   LDLCALC 166 (H) 08/08/2023   LDLDIRECT 85 01/23/2024   TRIG 129 08/08/2023   CHOLHDL 4.4 08/08/2023  On Crestor   40 mg daily (restarted the above results returned, as she was off), omega-3 fatty acids 1200 mg daily.  - last eye exam was: 11/16/2023: + DR. She also has glaucoma. Dr. [place] Frasier at [facility] care. She has a h/o cataract surgery.  -+ Numbness and tingling in her feet.  She has a callus on the left foot.  She sees a podiatrist.  She did not tolerate Lyrica.  In 05/2022, she had an infected ischemic ulcer due to arterial insufficiency after injury to her left foot in 09/2021.  She had left femoropopliteal bypass graft 11/18/2021.  Afterwards, she had a balloon angioplasty >> L blood flow improved >> she got back feeling in her toes.  Last foot exam was here in clinic: 07/04/2023. She went to [facility] today.  On ASA 81.  Pt has FH of DM in sister.  She also has a history of nontoxic multinodular goiter.  Latest TSH was reviewed and this was normal: Lab Results  Component Value Date   TSH 2.44 08/08/2023   She also has a history of HTN, brain aneurysm, emphysema, esophageal stricture, GERD.  She had hernia repair surgery- had mesh placed >> persistent abdominal pain. She started to take a supplement and vitamins in 09/2021 (by [facility] [place]) - sugars started to improve afterwards. She was admitted 12/8-07/2022 With chest pain, dizziness, shortness of breath-hypertensive urgency (sBP 200) - considered due to stress and anxiety.    Her husband's nephew, Wolm Hacker, was also my patient and he died at the beginning of 05-Aug-2020 after he stopped going to dialysis.  ROS: + see HPI  I reviewed pt's medications, allergies, PMH, social hx, family hx, and changes were documented in the history of present illness. Otherwise,  unchanged from my initial visit note.  Past Medical History:  Diagnosis Date   Abnormal findings on esophagogastroduodenoscopy (EGD) 07/2010   Allergy    Aneurysm 2003   in brain x 2,  small   Anxiety    Arthritis    Cataract    bilateral - surgery   CKD (chronic kidney disease) stage 5, GFR less than 15 ml/min (HCC)    f/b Dr Dennise w/ CKA   Colon polyps    Depression    Diabetes mellitus 1998   type 2   Diverticulosis    Emphysema of lung (HCC)    no one has evry told me that   ESOPHAGEAL STRICTURE 08/27/2008   Family history of adverse reaction to anesthesia    daughter n/v   GERD (gastroesophageal reflux disease)    Glaucoma    bilateral   Heart failure (HCC)  01/04/2024   Hemorrhoids    hx   Hiatal hernia    Hyperlipidemia    Hypertension 2000   Migraines    Neuropathy    fingers and toes   Peripheral vascular disease    Phlebitis    30 years ago  left leg   Stroke [facility])    multiple mini strokes ( brain aneurysm ).  Right side weaker. 2 strokes   Past Surgical History:  Procedure Laterality Date   ABDOMINAL AORTAGRAM N/A 01/04/2012   Procedure: ABDOMINAL AORTAGRAM;  Surgeon: Gaile LELON New, MD;  Location: [facility] CATH LAB;  Service: Cardiovascular;  Laterality: N/A;   ABDOMINAL AORTAGRAM N/A 08/15/2012   Procedure: ABDOMINAL EZELLA;  Surgeon: Gaile LELON New, MD;  Location[facility] CATH LAB;  Service: Cardiovascular;  Laterality: N/A;   ABDOMINAL AORTOGRAM W/LOWER EXTREMITY Left 11/17/2021   Procedure: ABDOMINAL AORTOGRAM W/LOWER EXTREMITY;  Surgeon: New Gaile LELON, MD;  Location: [facility] INVASIVE CV LAB;  Service: Cardiovascular;  Laterality: Left;   ABDOMINAL AORTOGRAM W/LOWER EXTREMITY N/A 03/09/2022   Procedure: ABDOMINAL AORTOGRAM W/LOWER EXTREMITY;  Surgeon: New Gaile LELON, MD;  Location: [facility] INVASIVE CV LAB;  Service: Cardiovascular;  Laterality: N/A;   ABDOMINAL AORTOGRAM W/LOWER EXTREMITY N/A 03/15/2023   Procedure: ABDOMINAL AORTOGRAM W/LOWER EXTREMITY;   Surgeon: New Gaile LELON, MD;  Location: [facility] INVASIVE CV LAB;  Service: Cardiovascular;  Laterality: N/A;   ABDOMINAL HYSTERECTOMY  1984   AV FISTULA PLACEMENT Right 12/21/2023   Procedure: RIGHT ARM BRACHIOCEPHALIC ARTERIOVENOUS (AV) FISTULA CREATION;  Surgeon: New Gaile LELON, MD;  Location: [facility] OR;  Service: Vascular;  Laterality: Right;   BREAST SURGERY     cataract surgery Right 10/22/2015   cataract surgery Left 11/05/2015   CESAREAN SECTION     CHOLECYSTECTOMY     COLONOSCOPY  07/2010   DENTAL SURGERY  05/05/2012   left lower    ESOPHAGOGASTRODUODENOSCOPY  07/2010   EYE SURGERY  07/2013   Laser-Glaucoma   FEMORAL ARTERY STENT  05/11/2011   Left superficial femoral and popliteal artery   FEMORAL-POPLITEAL BYPASS GRAFT Left 11/18/2021   Procedure: LEFT FEMORAL-POPLITEAL ARTERY BYPASS GRAFT;  Surgeon: New Gaile LELON, MD;  Location: [facility] OR;  Service: Vascular;  Laterality: Left;   FISTULA SUPERFICIALIZATION Right 02/01/2024   Procedure: RIGHT AV FISTULA SUPERFICIALIZATION;  Surgeon: New Gaile LELON, MD;  Location: [facility] OR;  Service: Vascular;  Laterality: Right;   HERNIA REPAIR     times two   KNEE SURGERY     LOWER EXTREMITY ANGIOGRAM Left 08/15/2012   Procedure: LOWER EXTREMITY ANGIOGRAM;  Surgeon: Gaile LELON New, MD;  Location[facility] CATH LAB;  Service: Cardiovascular;  Laterality: Left;  lt leg angio   PERIPHERAL VASCULAR BALLOON ANGIOPLASTY Left 03/09/2022   Procedure: PERIPHERAL VASCULAR BALLOON ANGIOPLASTY;  Surgeon: New Gaile LELON, MD;  Location: [facility] INVASIVE CV LAB;  Service: Cardiovascular;  Laterality: Left;   PERIPHERAL VASCULAR BALLOON ANGIOPLASTY Left 03/15/2023   Procedure: PERIPHERAL VASCULAR BALLOON ANGIOPLASTY;  Surgeon: New Gaile LELON, MD;  Location: [facility] INVASIVE CV LAB;  Service: Cardiovascular;  Laterality: Left;   rotator cuff surgery     THYROID  SURGERY     Social History   Socioeconomic History   Marital status: Married    Spouse name: Not on file    Number of children: 2   Years of education: Not on file   Highest education level: 12th grade  Occupational History   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 [employer]  Tobacco Use   Smoking  status: Every Day    Current packs/day: 0.25    Average packs/day: 0.4 packs/day for 48.6 years (20.1 ttl pk-yrs)    Types: Cigarettes    Start date: 11/30/2023    Passive exposure: Never   Smokeless tobacco: Never   Tobacco comments:    11/18/20 in process of quiting    03/31/23 5 cigarettes a day   Vaping Use   Vaping status: Never Used  Substance and Sexual Activity   Alcohol  use: Never   Drug use: Never   Sexual activity: Not Currently    Birth control/protection: None, Surgical    Comment: Hysterctomy  Other Topics Concern   Not on file  Social History Narrative   Lives with husband.        Social Drivers of Corporate investment banker Strain: Low Risk  (09/19/2023)   Overall Financial Resource Strain (CARDIA)    Difficulty of Paying Living Expenses: Not hard at all  Food Insecurity: No Food Insecurity (05/03/2024)   Hunger Vital Sign    Worried About Running Out of Food in the Last Year: Never true    Ran Out of Food in the Last Year: Never true  Transportation Needs: No Transportation Needs (05/03/2024)   PRAPARE - Administrator, Civil Service (Medical): No    Lack of Transportation (Non-Medical): No  Physical Activity: Unknown (09/19/2023)   Exercise Vital Sign    Days of Exercise per Week: 0 days    Minutes of Exercise per Session: Not on file  Stress: Stress Concern Present (09/19/2023)   [employer] of Occupational Health - Occupational Stress Questionnaire    Feeling of Stress : Very much  Social Connections: Socially Integrated (04/29/2024)   Social Connection and Isolation Panel    Frequency of Communication with Friends and Family: More than three times a week    Frequency of Social Gatherings with Friends and Family: More than three times a week     Attends Religious Services: More than 4 times per year    Active Member of [employer] or Organizations: Not on file    Attends Banker Meetings: 1 to 4 times per year    Marital Status: Married  Catering manager Violence: Not At Risk (05/03/2024)   Humiliation, Afraid, Rape, and Kick questionnaire    Fear of Current or Ex-Partner: No    Emotionally Abused: No    Physically Abused: No    Sexually Abused: No   Current Outpatient Medications on File Prior to Visit  Medication Sig Dispense Refill   acetaminophen  (TYLENOL ) 500 MG tablet Take 1,000 mg by mouth 2 (two) times daily.     amLODipine  (NORVASC ) 10 MG tablet Take 1 tablet (10 mg total) by mouth daily. 30 tablet 0   aspirin  EC 81 MG tablet Take 1 tablet (81 mg total) by mouth daily. Swallow whole. 30 tablet 12   busPIRone  (BUSPAR ) 5 MG tablet Take 1 tablet (5 mg total) by mouth 2 (two) times daily.     calcitRIOL  (ROCALTROL ) 0.5 MCG capsule Take 0.5 mcg by mouth in the morning.     cloNIDine  (CATAPRES ) 0.1 MG tablet Take 1 tablet (0.1 mg total) by mouth at bedtime. 60 tablet 11   Fluticasone -Umeclidin-Vilant (TRELEGY ELLIPTA ) 200-62.5-25 MCG/ACT AEPB Inhale 1 puff into the lungs daily. 3 each 3   furosemide  (LASIX ) 40 MG tablet Take 80 mg by mouth 2 (two) times daily.     hydrALAZINE  (APRESOLINE ) 100 MG tablet Take 1 tablet (  100 mg total) by mouth every 8 (eight) hours. 270 tablet 3   insulin  aspart (NOVOLOG  FLEXPEN) 100 UNIT/ML FlexPen Inject 10-14 Units into the skin 3 (three) times daily before meals. 45 mL 3   insulin  degludec (TRESIBA  FLEXTOUCH) 200 UNIT/ML FlexTouch Pen Inject 40 Units into the skin daily. 18 mL 3   ipratropium-albuterol  (DUONEB) 0.5-2.5 (3) MG/3ML SOLN Take 3 mLs by nebulization every 4 (four) hours as needed (Shortness of breath or Wheezing). 360 mL 2   isosorbide  mononitrate (IMDUR ) 60 MG 24 hr tablet Take 1 tablet (60 mg total) by mouth daily. 30 tablet 0   metoprolol  tartrate (LOPRESSOR ) 25 MG tablet  Take 1 tablet (25 mg total) by mouth 2 (two) times daily. 180 tablet 3   Omega-3 Fatty Acids (FISH OIL) 1000 MG CAPS Take 1,000 mg by mouth in the morning and at bedtime.     pantoprazole  (PROTONIX ) 40 MG tablet Take 1 tablet (40 mg total) by mouth 2 (two) times daily. 60 tablet 0   Potassium Chloride  ER 20 MEQ TBCR Take 2 tablets (40 mEq total) by mouth every other day. 60 tablet 0   rOPINIRole  (REQUIP ) 0.25 MG tablet Take 1 tablet (0.25 mg total) by mouth at bedtime. Take 0.25 mg by mouth at bedtime. 90 tablet 1   rosuvastatin  (CRESTOR ) 10 MG tablet Take 1 tablet (10 mg total) by mouth daily. 90 tablet 3   No current facility-administered medications on file prior to visit.   Allergies  Allergen Reactions   Ciprofloxacin  Swelling   Ozempic  (0.25 Or 0.5 Mg-Dose) [Semaglutide (0.25 Or 0.5mg -Dos)] Nausea And Vomiting   Plavix [Clopidogrel Bisulfate] Palpitations   Amoxicillin Itching and Swelling    FACE & EYES SWELL   Ace Inhibitors Cough    Sore throat   Atorvastatin      myalgias   Lyrica [Pregabalin] Itching and Nausea Only     gain weight   Penicillins Other (See Comments)    Pt unsure if there is an allergic reaction    Family History  Problem Relation Age of Onset   CAD Mother 22       Died of MI   Hypertension Mother    Heart attack Mother    Heart disease Mother    CAD Father 37       Died of MI   Heart disease Father    Heart attack Father    CAD Brother 58       Two brothers died of MI   Heart attack Brother    Heart disease Brother        Amputation   Diabetes Sister    Hypertension Sister    Heart attack Brother    Heart disease Brother    Stroke Sister    Colon cancer Neg Hx    Stomach cancer Neg Hx    Esophageal cancer Neg Hx    PE: BP 128/70   Pulse 67   Ht 5' 6 (1.676 m)   Wt 175 lb 6.4 oz (79.6 kg)   SpO2 96%   BMI 28.31 kg/m  Wt Readings from Last 10 Encounters:  07/05/24 175 lb 6.4 oz (79.6 kg)  05/02/24 195 lb 6.4 oz (88.6 kg)  04/09/24  179 lb (81.2 kg)  03/21/24 177 lb 0.5 oz (80.3 kg)  03/20/24 179 lb (81.2 kg)  03/05/24 183 lb 9.6 oz (83.3 kg)  02/27/24 182 lb 1.6 oz (82.6 kg)  02/10/24 183 lb (83 kg)  02/07/24 183 lb (83  kg)  02/01/24 184 lb (83.5 kg)   Constitutional: overweight, in NAD Eyes:  EOMI, no exophthalmos ENT: no neck masses, no cervical lymphadenopathy Cardiovascular: RRR, No MRG Respiratory: CTA Musculoskeletal: no deformities Skin:no rashes Neurological: no tremor with outstretched hands  ASSESSMENT: 1. DM2, insulin -dependent, uncontrolled, with complications - PVD - s/p stents and fem-pop bypass - h/o CVA - CKD stage 3b - DR - PN  Her antipancreatic antibodies were negative: Component     Latest Ref Rng & Units 07/22/2016  Glutamic Acid Decarb Ab     <5 IU/mL <5   2.  Hyperlipidemia  3.  Obesity class 1  PLAN:  1. Patient with longstanding, uncontrolled, type 2 diabetes, on basal/bolus insulin  regimen.  Her SGLT2 inhibitor was discontinued by nephrology.  We tried to add Mounjaro , but she did not start this.  Before last visit she developed end-stage renal disease after being admitted with CHF exacerbation and fluid overload in 12/2023.  Since last visit, she was started on dialysis 3 times a week. - At last visit, she was complaining of a metal taste in her mouth and started to suck on candy.  Blood sugars were elevated so I strongly advised her to stop the candy.  She was only checking blood sugars in the morning and we discussed about the absolute need to check some later in the day but we did not have enough data to support a change in regimen at that time. - At today's visit, she is not checking sugars consistently, she only has few recent values, at goal in the morning (in the last week), but she did have 1 low blood sugar in the middle of the night, at 39.  Her Tresiba  dose was decreased at that time from 40 units to 28 units, which she continues now. - At today's visit, I called in a  prescription for another glucometer to her pharmacy and advised her to check more frequently, rotating check times.  For safety, in the absence of more information, I advised her to continue to reduce the Tresiba  and NovoLog  doses.  I did advise her to contact me if the sugars start increasing or she still has lows.  I also advised her to try to take Tresiba  after dialysis. - I suggested to:  Patient Instructions  Please decrease: - Tresiba  20 units daily - Novolog  5-8 units 2x day before meals  Please return in 3 months with your sugar log.    - we checked her HbA1c: 5.7% (lower) - advised to check sugars at different times of the day - 4x a day, rotating check times - advised for yearly eye exams >> she is UTD - return to clinic in 3-4 months  2. HL - Reviewed latest 2 lipid panels.  LDL improved dramatically between the 2 time points, after restarting Crestor .  Fractions are at goal otherwise: Lab Results  Component Value Date   CHOL 249 (H) 08/08/2023   HDL 57 08/08/2023   LDLCALC 166 (H) 08/08/2023   LDLDIRECT 85 01/23/2024   TRIG 129 08/08/2023   CHOLHDL 4.4 08/08/2023  - She is on Crestor  40 mg daily, omega-3 fatty acids 1200 mg daily  3.  Overweight - We previously could not use a GLP-1 receptor agonist due to GI symptoms.  At last visit, she agreed to try Mounjaro  at a low dose and increase as tolerated.  However, she is not taking this now. - she was on Farxiga  at last visit, but she was  taken off by nephrology. - She lost 12 pounds before last visit and 7 pounds net since last visit  Lela Fendt, MD PhD [facility]

## 2024-07-06 DIAGNOSIS — E1122 Type 2 diabetes mellitus with diabetic chronic kidney disease: Secondary | ICD-10-CM | POA: Diagnosis not present

## 2024-07-06 DIAGNOSIS — Z992 Dependence on renal dialysis: Secondary | ICD-10-CM | POA: Diagnosis not present

## 2024-07-06 DIAGNOSIS — D509 Iron deficiency anemia, unspecified: Secondary | ICD-10-CM | POA: Diagnosis not present

## 2024-07-06 DIAGNOSIS — N186 End stage renal disease: Secondary | ICD-10-CM | POA: Diagnosis not present

## 2024-07-06 DIAGNOSIS — N2581 Secondary hyperparathyroidism of renal origin: Secondary | ICD-10-CM | POA: Diagnosis not present

## 2024-07-06 DIAGNOSIS — E876 Hypokalemia: Secondary | ICD-10-CM | POA: Diagnosis not present

## 2024-07-09 DIAGNOSIS — E1122 Type 2 diabetes mellitus with diabetic chronic kidney disease: Secondary | ICD-10-CM | POA: Diagnosis not present

## 2024-07-09 DIAGNOSIS — N186 End stage renal disease: Secondary | ICD-10-CM | POA: Diagnosis not present

## 2024-07-09 DIAGNOSIS — Z992 Dependence on renal dialysis: Secondary | ICD-10-CM | POA: Diagnosis not present

## 2024-07-09 DIAGNOSIS — N2581 Secondary hyperparathyroidism of renal origin: Secondary | ICD-10-CM | POA: Diagnosis not present

## 2024-07-09 DIAGNOSIS — D509 Iron deficiency anemia, unspecified: Secondary | ICD-10-CM | POA: Diagnosis not present

## 2024-07-09 DIAGNOSIS — E876 Hypokalemia: Secondary | ICD-10-CM | POA: Diagnosis not present

## 2024-07-11 DIAGNOSIS — Z992 Dependence on renal dialysis: Secondary | ICD-10-CM | POA: Diagnosis not present

## 2024-07-11 DIAGNOSIS — N2581 Secondary hyperparathyroidism of renal origin: Secondary | ICD-10-CM | POA: Diagnosis not present

## 2024-07-11 DIAGNOSIS — D509 Iron deficiency anemia, unspecified: Secondary | ICD-10-CM | POA: Diagnosis not present

## 2024-07-11 DIAGNOSIS — N186 End stage renal disease: Secondary | ICD-10-CM | POA: Diagnosis not present

## 2024-07-11 DIAGNOSIS — E1122 Type 2 diabetes mellitus with diabetic chronic kidney disease: Secondary | ICD-10-CM | POA: Diagnosis not present

## 2024-07-11 DIAGNOSIS — E876 Hypokalemia: Secondary | ICD-10-CM | POA: Diagnosis not present

## 2024-07-13 DIAGNOSIS — Z992 Dependence on renal dialysis: Secondary | ICD-10-CM | POA: Diagnosis not present

## 2024-07-13 DIAGNOSIS — D509 Iron deficiency anemia, unspecified: Secondary | ICD-10-CM | POA: Diagnosis not present

## 2024-07-13 DIAGNOSIS — N2581 Secondary hyperparathyroidism of renal origin: Secondary | ICD-10-CM | POA: Diagnosis not present

## 2024-07-13 DIAGNOSIS — E876 Hypokalemia: Secondary | ICD-10-CM | POA: Diagnosis not present

## 2024-07-13 DIAGNOSIS — N186 End stage renal disease: Secondary | ICD-10-CM | POA: Diagnosis not present

## 2024-07-13 DIAGNOSIS — E1122 Type 2 diabetes mellitus with diabetic chronic kidney disease: Secondary | ICD-10-CM | POA: Diagnosis not present

## 2024-07-14 ENCOUNTER — Encounter: Payer: Self-pay | Admitting: Internal Medicine

## 2024-07-16 DIAGNOSIS — E1122 Type 2 diabetes mellitus with diabetic chronic kidney disease: Secondary | ICD-10-CM | POA: Diagnosis not present

## 2024-07-16 DIAGNOSIS — E876 Hypokalemia: Secondary | ICD-10-CM | POA: Diagnosis not present

## 2024-07-16 DIAGNOSIS — D509 Iron deficiency anemia, unspecified: Secondary | ICD-10-CM | POA: Diagnosis not present

## 2024-07-16 DIAGNOSIS — N186 End stage renal disease: Secondary | ICD-10-CM | POA: Diagnosis not present

## 2024-07-16 DIAGNOSIS — Z992 Dependence on renal dialysis: Secondary | ICD-10-CM | POA: Diagnosis not present

## 2024-07-16 DIAGNOSIS — N2581 Secondary hyperparathyroidism of renal origin: Secondary | ICD-10-CM | POA: Diagnosis not present

## 2024-07-16 MED ORDER — ACCU-CHEK GUIDE TEST VI STRP: ORAL_STRIP | 12 refills | Status: AC

## 2024-07-16 NOTE — Addendum Note (Signed)
 Addended by: CLEOTILDE ROLIN RAMAN on: 07/16/2024 11:25 AM   Modules accepted: Orders

## 2024-07-17 DIAGNOSIS — M7061 Trochanteric bursitis, right hip: Secondary | ICD-10-CM | POA: Diagnosis not present

## 2024-07-17 DIAGNOSIS — M170 Bilateral primary osteoarthritis of knee: Secondary | ICD-10-CM | POA: Diagnosis not present

## 2024-07-18 DIAGNOSIS — N186 End stage renal disease: Secondary | ICD-10-CM | POA: Diagnosis not present

## 2024-07-18 DIAGNOSIS — Z992 Dependence on renal dialysis: Secondary | ICD-10-CM | POA: Diagnosis not present

## 2024-07-18 DIAGNOSIS — N2581 Secondary hyperparathyroidism of renal origin: Secondary | ICD-10-CM | POA: Diagnosis not present

## 2024-07-18 DIAGNOSIS — D509 Iron deficiency anemia, unspecified: Secondary | ICD-10-CM | POA: Diagnosis not present

## 2024-07-18 DIAGNOSIS — E1122 Type 2 diabetes mellitus with diabetic chronic kidney disease: Secondary | ICD-10-CM | POA: Diagnosis not present

## 2024-07-18 DIAGNOSIS — E876 Hypokalemia: Secondary | ICD-10-CM | POA: Diagnosis not present

## 2024-07-20 DIAGNOSIS — Z992 Dependence on renal dialysis: Secondary | ICD-10-CM | POA: Diagnosis not present

## 2024-07-20 DIAGNOSIS — E1122 Type 2 diabetes mellitus with diabetic chronic kidney disease: Secondary | ICD-10-CM | POA: Diagnosis not present

## 2024-07-20 DIAGNOSIS — N186 End stage renal disease: Secondary | ICD-10-CM | POA: Diagnosis not present

## 2024-07-20 DIAGNOSIS — E876 Hypokalemia: Secondary | ICD-10-CM | POA: Diagnosis not present

## 2024-07-20 DIAGNOSIS — N2581 Secondary hyperparathyroidism of renal origin: Secondary | ICD-10-CM | POA: Diagnosis not present

## 2024-07-20 DIAGNOSIS — D509 Iron deficiency anemia, unspecified: Secondary | ICD-10-CM | POA: Diagnosis not present

## 2024-07-21 DIAGNOSIS — Z992 Dependence on renal dialysis: Secondary | ICD-10-CM | POA: Diagnosis not present

## 2024-07-21 DIAGNOSIS — E1121 Type 2 diabetes mellitus with diabetic nephropathy: Secondary | ICD-10-CM | POA: Diagnosis not present

## 2024-07-21 DIAGNOSIS — N186 End stage renal disease: Secondary | ICD-10-CM | POA: Diagnosis not present

## 2024-07-23 DIAGNOSIS — N2581 Secondary hyperparathyroidism of renal origin: Secondary | ICD-10-CM | POA: Diagnosis not present

## 2024-07-23 DIAGNOSIS — D631 Anemia in chronic kidney disease: Secondary | ICD-10-CM | POA: Diagnosis not present

## 2024-07-23 DIAGNOSIS — D509 Iron deficiency anemia, unspecified: Secondary | ICD-10-CM | POA: Diagnosis not present

## 2024-07-23 DIAGNOSIS — Z992 Dependence on renal dialysis: Secondary | ICD-10-CM | POA: Diagnosis not present

## 2024-07-23 DIAGNOSIS — E876 Hypokalemia: Secondary | ICD-10-CM | POA: Diagnosis not present

## 2024-07-23 DIAGNOSIS — N186 End stage renal disease: Secondary | ICD-10-CM | POA: Diagnosis not present

## 2024-07-23 DIAGNOSIS — E1122 Type 2 diabetes mellitus with diabetic chronic kidney disease: Secondary | ICD-10-CM | POA: Diagnosis not present

## 2024-07-25 DIAGNOSIS — E876 Hypokalemia: Secondary | ICD-10-CM | POA: Diagnosis not present

## 2024-07-25 DIAGNOSIS — N186 End stage renal disease: Secondary | ICD-10-CM | POA: Diagnosis not present

## 2024-07-25 DIAGNOSIS — Z992 Dependence on renal dialysis: Secondary | ICD-10-CM | POA: Diagnosis not present

## 2024-07-25 DIAGNOSIS — D509 Iron deficiency anemia, unspecified: Secondary | ICD-10-CM | POA: Diagnosis not present

## 2024-07-25 DIAGNOSIS — N2581 Secondary hyperparathyroidism of renal origin: Secondary | ICD-10-CM | POA: Diagnosis not present

## 2024-07-25 DIAGNOSIS — E1122 Type 2 diabetes mellitus with diabetic chronic kidney disease: Secondary | ICD-10-CM | POA: Diagnosis not present

## 2024-07-26 ENCOUNTER — Ambulatory Visit (HOSPITAL_BASED_OUTPATIENT_CLINIC_OR_DEPARTMENT_OTHER)
Admission: RE | Admit: 2024-07-26 | Discharge: 2024-07-26 | Disposition: A | Discharge status: Home / Self Care | Source: Ambulatory Visit | Attending: Surgery | Admitting: Surgery

## 2024-07-26 ENCOUNTER — Other Ambulatory Visit: Payer: Self-pay

## 2024-07-26 ENCOUNTER — Ambulatory Visit: Admitting: Physician Assistant

## 2024-07-26 ENCOUNTER — Ambulatory Visit (HOSPITAL_COMMUNITY)
Admission: RE | Admit: 2024-07-26 | Discharge: 2024-07-26 | Disposition: A | Discharge status: Home / Self Care | Source: Ambulatory Visit | Attending: Surgery | Admitting: Surgery

## 2024-07-26 VITALS — BP 201/63 | HR 63 | Temp 97.7°F | Wt 174.8 lb

## 2024-07-26 DIAGNOSIS — I6523 Occlusion and stenosis of bilateral carotid arteries: Secondary | ICD-10-CM

## 2024-07-26 DIAGNOSIS — I70219 Atherosclerosis of native arteries of extremities with intermittent claudication, unspecified extremity: Secondary | ICD-10-CM | POA: Insufficient documentation

## 2024-07-26 DIAGNOSIS — N186 End stage renal disease: Secondary | ICD-10-CM | POA: Insufficient documentation

## 2024-07-26 DIAGNOSIS — I739 Peripheral vascular disease, unspecified: Secondary | ICD-10-CM

## 2024-07-26 DIAGNOSIS — I70402 Unspecified atherosclerosis of autologous vein bypass graft(s) of the extremities, left leg: Secondary | ICD-10-CM

## 2024-07-26 LAB — VAS US ABI WITH/WO TBI: Right ABI: 0.69

## 2024-07-26 NOTE — H&P (View-Only) (Signed)
 Office Note     CC:  follow up Requesting Provider:  Paseda, Folashade R, FNP  HPI: [name] is a 78 y.o. (1946/07/01) female who presents for follow up of carotid stenosis, CKD and PAD. She has history of peripheral vascular disease having undergone left SFA stenting in 2012. She then underwent left femoral to below-knee popliteal bypass graft with vein on 11/18/2021. She developed a distal anastomotic stenosis and underwent drug-coated balloon angioplasty on 03/15/2023. She has been asymptomatic since.   Today she reports significant pain in her legs but a lot of this is around her knees. She was told she has a lot of OA in her right knee and a bakers cyst behind the left knee. She has seen orthopedics who recommend cortisone injections vs knee replacement on the right leg. They were going to drain her Baker's cyst but wanted to make sure she was evaluated by us  prior to. She says she has been having a lot of pain when ambulating since about August. She does not clearly describe claudication pain. No rest pain or tissue loss. She has been using a rollator since August because of her pain.   She also has known carotid stenosis which has been asymptomatic. She does report today that she has had 3 episodes where she has had complete loss of vision. She says she use to have severe migraines where she would get squiggly vision changes and associated headache. Now she does not usually have pain. If she does it would be after the visual changes on one side of her head or the other. No other associated symptoms like aura, nausea, vomiting. She says the loss of vision has happened sporadically. It lasts about 15-20 minutes and occurs in both eyes at the same time. She otherwise denies any slurred speech, facial drooping, unilateral upper or lower extremity weakness or numbness.      She has a right brachiocephalic AV fistula superficialization by Dr. Serene on 02/01/2024. Her brachiocephalic fistula was  first created on 12/21/2023. At her last visit she had not yet started HD. She explains that she started HD in August. Her fistula overall has been working well. She does state that just this week on Monday and Wednesday at the end of her HD session she had a lot of bleeding. She feels this was related to the [facility] not paying attention. She has had no other bleeding issues or pain with her arm. She dialyzes on MWF.   The pt is on a statin for cholesterol management.    The pt is on an aspirin .  Other AC:  none The pt is on BB, hydralazine  for hypertension.  The pt is  diabetes. 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,  small   Anxiety    Arthritis    Cataract    bilateral - surgery   CKD (chronic kidney disease) stage 5, GFR less than 15 ml/min (HCC)    f/b Dr Dennise w/ CKA   Colon polyps    Depression    Diabetes mellitus 1998   type 2   Diverticulosis    Emphysema of lung (HCC)    no one has evry told me that   ESOPHAGEAL STRICTURE 08/27/2008   Family history of adverse reaction to anesthesia    daughter n/v   GERD (gastroesophageal  reflux disease)    Glaucoma    bilateral   Heart failure (HCC) 01/04/2024   Hemorrhoids    hx   Hiatal hernia    Hyperlipidemia    Hypertension 2000   Migraines    Neuropathy    fingers and toes   Peripheral vascular disease    Phlebitis    30 years ago  left leg   Stroke [facility])    multiple mini strokes ( brain aneurysm ).  Right side weaker. 2 strokes    Past Surgical History:  Procedure Laterality Date   ABDOMINAL AORTAGRAM N/A 01/04/2012   Procedure: ABDOMINAL AORTAGRAM;  Surgeon: Gaile LELON New, MD;  Location[facility] CATH LAB;  Service: Cardiovascular;  Laterality: N/A;   ABDOMINAL AORTAGRAM N/A 08/15/2012   Procedure: ABDOMINAL EZELLA;  Surgeon: Gaile LELON New, MD;  Location: [facility] CATH LAB;   Service: Cardiovascular;  Laterality: N/A;   ABDOMINAL AORTOGRAM W/LOWER EXTREMITY Left 11/17/2021   Procedure: ABDOMINAL AORTOGRAM W/LOWER EXTREMITY;  Surgeon: New Gaile LELON, MD;  Location: [facility] INVASIVE CV LAB;  Service: Cardiovascular;  Laterality: Left;   ABDOMINAL AORTOGRAM W/LOWER EXTREMITY N/A 03/09/2022   Procedure: ABDOMINAL AORTOGRAM W/LOWER EXTREMITY;  Surgeon: New Gaile LELON, MD;  Location: [facility] INVASIVE CV LAB;  Service: Cardiovascular;  Laterality: N/A;   ABDOMINAL AORTOGRAM W/LOWER EXTREMITY N/A 03/15/2023   Procedure: ABDOMINAL AORTOGRAM W/LOWER EXTREMITY;  Surgeon: New Gaile LELON, MD;  Location: [facility] INVASIVE CV LAB;  Service: Cardiovascular;  Laterality: N/A;   ABDOMINAL HYSTERECTOMY  1984   AV FISTULA PLACEMENT Right 12/21/2023   Procedure: RIGHT ARM BRACHIOCEPHALIC ARTERIOVENOUS (AV) FISTULA CREATION;  Surgeon: New Gaile LELON, MD;  Location: [facility] OR;  Service: Vascular;  Laterality: Right;   BREAST SURGERY     cataract surgery Right 10/22/2015   cataract surgery Left 11/05/2015   CESAREAN SECTION     CHOLECYSTECTOMY     COLONOSCOPY  07/2010   DENTAL SURGERY  05/05/2012   left lower    ESOPHAGOGASTRODUODENOSCOPY  07/2010   EYE SURGERY  07/2013   Laser-Glaucoma   FEMORAL ARTERY STENT  05/11/2011   Left superficial femoral and popliteal artery   FEMORAL-POPLITEAL BYPASS GRAFT Left 11/18/2021   Procedure: LEFT FEMORAL-POPLITEAL ARTERY BYPASS GRAFT;  Surgeon: New Gaile LELON, MD;  Location: [facility] OR;  Service: Vascular;  Laterality: Left;   FISTULA SUPERFICIALIZATION Right 02/01/2024   Procedure: RIGHT AV FISTULA SUPERFICIALIZATION;  Surgeon: New Gaile LELON, MD;  Location: [facility] OR;  Service: Vascular;  Laterality: Right;   HERNIA REPAIR     times two   KNEE SURGERY     LOWER EXTREMITY ANGIOGRAM Left 08/15/2012   Procedure: LOWER EXTREMITY ANGIOGRAM;  Surgeon: Gaile LELON New, MD;  Location: [facility] CATH LAB;  Service: Cardiovascular;  Laterality: Left;  lt leg angio   PERIPHERAL  VASCULAR BALLOON ANGIOPLASTY Left 03/09/2022   Procedure: PERIPHERAL VASCULAR BALLOON ANGIOPLASTY;  Surgeon: New Gaile LELON, MD;  Location: [facility] INVASIVE CV LAB;  Service: Cardiovascular;  Laterality: Left;   PERIPHERAL VASCULAR BALLOON ANGIOPLASTY Left 03/15/2023   Procedure: PERIPHERAL VASCULAR BALLOON ANGIOPLASTY;  Surgeon: New Gaile LELON, MD;  Location: [facility] INVASIVE CV LAB;  Service: Cardiovascular;  Laterality: Left;   rotator cuff surgery     THYROID  SURGERY      Social History   Socioeconomic History   Marital status: Married    Spouse name: Not on file   Number of children: 2   Years of education: Not on file   Highest education level: 12th grade  Occupational History   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  Tobacco Use   Smoking status: Every Day    Current packs/day: 0.25    Average packs/day: 0.4 packs/day for 48.7 years (20.2 ttl pk-yrs)    Types: Cigarettes    Start date: 11/30/2023    Passive exposure: Never   Smokeless tobacco: Never   Tobacco comments:    11/18/20 in process of quiting    03/31/23 5 cigarettes a day   Vaping Use   Vaping status: Never Used  Substance and Sexual Activity   Alcohol  use: Never   Drug use: Never   Sexual activity: Not Currently    Birth control/protection: None, Surgical    Comment: Hysterctomy  Other Topics Concern   Not on file  Social History Narrative   Lives with husband.        Social Drivers of Corporate Investment Banker Strain: Low Risk  (09/19/2023)   Overall Financial Resource Strain (CARDIA)    Difficulty of Paying Living Expenses: Not hard at all  Food Insecurity: No Food Insecurity (05/03/2024)   Hunger Vital Sign    Worried About Running Out of Food in the Last Year: Never true    Ran Out of Food in the Last Year: Never true  Transportation Needs: No Transportation Needs (05/03/2024)   PRAPARE - Administrator, Civil Service (Medical): No    Lack of Transportation (Non-Medical): No   Physical Activity: Unknown (09/19/2023)   Exercise Vital Sign    Days of Exercise per Week: 0 days    Minutes of Exercise per Session: Not on file  Stress: Stress Concern Present (09/19/2023)   [employer] of Occupational Health - Occupational Stress Questionnaire    Feeling of Stress : Very much  Social Connections: Socially Integrated (04/29/2024)   Social Connection and Isolation Panel    Frequency of Communication with Friends and Family: More than three times a week    Frequency of Social Gatherings with Friends and Family: More than three times a week    Attends Religious Services: More than 4 times per year    Active Member of [employer] or Organizations: Not on file    Attends Banker Meetings: 1 to 4 times per year    Marital Status: Married  Catering Manager Violence: Not At Risk (05/03/2024)   Humiliation, Afraid, Rape, and Kick questionnaire    Fear of Current or Ex-Partner: No    Emotionally Abused: No    Physically Abused: No    Sexually Abused: No    Family History  Problem Relation Age of Onset   CAD Mother 49       Died of MI   Hypertension Mother    Heart attack Mother    Heart disease Mother    CAD Father 33       Died of MI   Heart disease Father    Heart attack Father    CAD Brother 38       Two brothers died of MI   Heart attack Brother    Heart disease Brother        Amputation   Diabetes Sister    Hypertension Sister    Heart attack Brother    Heart disease Brother    Stroke Sister    Colon cancer Neg Hx    Stomach cancer Neg Hx    Esophageal cancer Neg Hx     Current Outpatient Medications  Medication Sig  Dispense Refill   Accu-Chek Softclix Lancets lancets Use as instructed 3x a day 300 each 3   acetaminophen  (TYLENOL ) 500 MG tablet Take 1,000 mg by mouth 2 (two) times daily.     aspirin  EC 81 MG tablet Take 1 tablet (81 mg total) by mouth daily. Swallow whole. 30 tablet 12   Blood Glucose Monitoring Suppl (ACCU-CHEK GUIDE)  w/Device KIT Use as advised 1 kit 0   busPIRone  (BUSPAR ) 5 MG tablet Take 1 tablet (5 mg total) by mouth 2 (two) times daily.     calcitRIOL  (ROCALTROL ) 0.5 MCG capsule Take 0.5 mcg by mouth in the morning.     Fluticasone -Umeclidin-Vilant (TRELEGY ELLIPTA ) 200-62.5-25 MCG/ACT AEPB Inhale 1 puff into the lungs daily. 3 each 3   glucose blood (ACCU-CHEK GUIDE TEST) test strip Use to check blood sugar 3 times a day. 300 strip 12   hydrALAZINE  (APRESOLINE ) 100 MG tablet Take 1 tablet (100 mg total) by mouth every 8 (eight) hours. 270 tablet 3   insulin  aspart (NOVOLOG  FLEXPEN) 100 UNIT/ML FlexPen Inject 10-14 Units into the skin 3 (three) times daily before meals. 45 mL 3   insulin  degludec (TRESIBA  FLEXTOUCH) 200 UNIT/ML FlexTouch Pen Inject 40 Units into the skin daily. 18 mL 3   ipratropium-albuterol  (DUONEB) 0.5-2.5 (3) MG/3ML SOLN Take 3 mLs by nebulization every 4 (four) hours as needed (Shortness of breath or Wheezing). 360 mL 2   isosorbide  mononitrate (IMDUR ) 60 MG 24 hr tablet Take 1 tablet (60 mg total) by mouth daily. 30 tablet 0   metoprolol  tartrate (LOPRESSOR ) 25 MG tablet Take 1 tablet (25 mg total) by mouth 2 (two) times daily. 180 tablet 3   Omega-3 Fatty Acids (FISH OIL) 1000 MG CAPS Take 1,000 mg by mouth in the morning and at bedtime.     pantoprazole  (PROTONIX ) 40 MG tablet Take 1 tablet (40 mg total) by mouth 2 (two) times daily. 60 tablet 0   Potassium Chloride  ER 20 MEQ TBCR Take 2 tablets (40 mEq total) by mouth every other day. 60 tablet 0   rOPINIRole  (REQUIP ) 0.25 MG tablet Take 1 tablet (0.25 mg total) by mouth at bedtime. Take 0.25 mg by mouth at bedtime. 90 tablet 1   rosuvastatin  (CRESTOR ) 10 MG tablet Take 1 tablet (10 mg total) by mouth daily. 90 tablet 3   spironolactone (ALDACTONE) 25 MG tablet Take 25 mg by mouth daily.     torsemide (DEMADEX) 100 MG tablet 1 (ONE) TABLET BY MOUTH TWO TIMES DAILY FOR VOLUME OVERLOAD     No current facility-administered  medications for this visit.    Allergies  Allergen Reactions   Ciprofloxacin  Swelling   Ozempic  (0.25 Or 0.5 Mg-Dose) [Semaglutide (0.25 Or 0.5mg -Dos)] Nausea And Vomiting   Plavix [Clopidogrel Bisulfate] Palpitations   Amoxicillin Itching and Swelling    FACE & EYES SWELL   Ace Inhibitors Cough    Sore throat   Atorvastatin      myalgias   Lyrica [Pregabalin] Itching and Nausea Only     gain weight   Penicillins Other (See Comments)    Pt unsure if there is an allergic reaction      REVIEW OF SYSTEMS:  Negative unless noted in HPI [X]  denotes positive finding, [ ]  denotes negative finding Cardiac  Comments:  Chest pain or chest pressure:    Shortness of breath upon exertion:    Short of breath when lying flat:    Irregular heart rhythm:  Vascular    Pain in calf, thigh, or hip brought on by ambulation:    Pain in feet at night that wakes you up from your sleep:     Blood clot in your veins:    Leg swelling:         Pulmonary    Oxygen at home:    Productive cough:     Wheezing:         Neurologic    Sudden weakness in arms or legs:     Sudden numbness in arms or legs:     Sudden onset of difficulty speaking or slurred speech:    Temporary loss of vision in one eye:     Problems with dizziness:         Gastrointestinal    Blood in stool:     Vomited blood:         Genitourinary    Burning when urinating:     Blood in urine:        Psychiatric    Major depression:         Hematologic    Bleeding problems:    Problems with blood clotting too easily:        Skin    Rashes or ulcers:        Constitutional    Fever or chills:      PHYSICAL EXAMINATION:  Vitals:   07/26/24 1320  BP: (!) 201/63  Pulse: 63  Temp: 97.7 F (36.5 C)  TempSrc: Temporal  Weight: 174 lb 12.8 oz (79.3 kg)   General:  WDWN in NAD; vital signs documented above Gait: uses rollator HENT: WNL, normocephalic Pulmonary: normal non-labored breathing Cardiac: regular  HR Abdomen: soft Vascular Exam/Pulses: 2+ femoral, no palpable distal pulses, feet are warm and well perfused Extremities: without ischemic changes, without Gangrene , without cellulitis; without open wounds;  Musculoskeletal: no muscle wasting or atrophy  Neurologic: A&O X 3 Psychiatric:  The pt has Normal affect.   Non-Invasive Vascular Imaging:   VAS US  Carotid Duplex: Summary:  Right Carotid: Velocities in the right ICA are consistent with a 40-59% stenosis. Non-hemodynamically significant plaque <50% noted in the CCA.   Left Carotid: Velocities in the left ICA are consistent with a 1-39% stenosis. Hemodynamically significant plaque >50% visualized in the CCA.   Vertebrals: Bilateral vertebral arteries demonstrate antegrade flow.  Subclavians: Right subclavian artery flow was disturbed/turbulent due to presence of AVF. Normal flow hemodynamics were seen in the left subclavian artery.   VAS US  Lower extremity bypass graft Duplex: Summary:  Left: Patent left femoral to below-knee popliteal bypass graft with 50-74% inflow stenosis, distal anastomosis >70% stenosis and outflow 50-74% stenosis.   +-------+-----------+-----------+------------+------------+  ABI/TBIToday's ABIToday's TBIPrevious ABIPrevious TBI  +-------+-----------+-----------+------------+------------+  Right 0.69       0.36       0.48        0.28          +-------+-----------+-----------+------------+------------+  Left  [place]         0.48       0.62        0.39          +-------+-----------+-----------+------------+------------+   ASSESSMENT/PLAN:: 78 y.o. female here for follow up for PAD, ESRD on HD and Carotid artery stenosis.  Carotid Artery stenosis - She remains without any TIA or stroke like symptoms - Her bilateral loss of vision is not typical for carotid disease. I suspect this is more neurological. Recommend that she  follow up with Neurologist for further evaluation - Duplex today is  overall stable with 40-59% R ICA stenosis, left 1-39% - Continue Aspirin  and Statin - She will have repeat duplex in 1 year  ESRD on HD since her last visit - She started HD in August. Dialyzes on MWF - Fistula functioning well - Had two episodes of bleeding this week but it was same tech and she feels tech was just not paying attention when removing the needles  PAD - Having bilateral leg pain. Not typical claudication pain but hard to really know as she is having significant knee pain bilaterally that is limiting her mobility. No rest pain or tissue loss - Duplex shows both recurrent inflow and outflow bypass graft stenosis concerning for threatened bypass with distal velocities >600 - ABI is up slightly on the right and now non compressible on the left. Monophasic waveforms bilaterally. Toe pressures are actually improved compared to last ABI - With her duplex findings and concern for threatened bypass I have recommended Angiogram to hopefully prevent bypass graft occlusion. I will arrange this in the near future with Dr. Serene on a non dialysis day   Teretha Damme, PA-C [facility] [phone]  Clinic MD:

## 2024-07-26 NOTE — Progress Notes (Signed)
 Office Note     CC:  follow up Requesting Provider:  Paseda, Folashade R, FNP  HPI: [name] is a 78 y.o. (1946/07/01) female who presents for follow up of carotid stenosis, CKD and PAD. She has history of peripheral vascular disease having undergone left SFA stenting in 2012. She then underwent left femoral to below-knee popliteal bypass graft with vein on 11/18/2021. She developed a distal anastomotic stenosis and underwent drug-coated balloon angioplasty on 03/15/2023. She has been asymptomatic since.   Today she reports significant pain in her legs but a lot of this is around her knees. She was told she has a lot of OA in her right knee and a bakers cyst behind the left knee. She has seen orthopedics who recommend cortisone injections vs knee replacement on the right leg. They were going to drain her Baker's cyst but wanted to make sure she was evaluated by us  prior to. She says she has been having a lot of pain when ambulating since about August. She does not clearly describe claudication pain. No rest pain or tissue loss. She has been using a rollator since August because of her pain.   She also has known carotid stenosis which has been asymptomatic. She does report today that she has had 3 episodes where she has had complete loss of vision. She says she use to have severe migraines where she would get squiggly vision changes and associated headache. Now she does not usually have pain. If she does it would be after the visual changes on one side of her head or the other. No other associated symptoms like aura, nausea, vomiting. She says the loss of vision has happened sporadically. It lasts about 15-20 minutes and occurs in both eyes at the same time. She otherwise denies any slurred speech, facial drooping, unilateral upper or lower extremity weakness or numbness.      She has a right brachiocephalic AV fistula superficialization by Dr. Serene on 02/01/2024. Her brachiocephalic fistula was  first created on 12/21/2023. At her last visit she had not yet started HD. She explains that she started HD in August. Her fistula overall has been working well. She does state that just this week on Monday and Wednesday at the end of her HD session she had a lot of bleeding. She feels this was related to the [facility] not paying attention. She has had no other bleeding issues or pain with her arm. She dialyzes on MWF.   The pt is on a statin for cholesterol management.    The pt is on an aspirin .  Other AC:  none The pt is on BB, hydralazine  for hypertension.  The pt is  diabetes. 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,  small   Anxiety    Arthritis    Cataract    bilateral - surgery   CKD (chronic kidney disease) stage 5, GFR less than 15 ml/min (HCC)    f/b Dr Dennise w/ CKA   Colon polyps    Depression    Diabetes mellitus 1998   type 2   Diverticulosis    Emphysema of lung (HCC)    no one has evry told me that   ESOPHAGEAL STRICTURE 08/27/2008   Family history of adverse reaction to anesthesia    daughter n/v   GERD (gastroesophageal  reflux disease)    Glaucoma    bilateral   Heart failure (HCC) 01/04/2024   Hemorrhoids    hx   Hiatal hernia    Hyperlipidemia    Hypertension 2000   Migraines    Neuropathy    fingers and toes   Peripheral vascular disease    Phlebitis    30 years ago  left leg   Stroke [facility])    multiple mini strokes ( brain aneurysm ).  Right side weaker. 2 strokes    Past Surgical History:  Procedure Laterality Date   ABDOMINAL AORTAGRAM N/A 01/04/2012   Procedure: ABDOMINAL AORTAGRAM;  Surgeon: Gaile LELON New, MD;  Location: [facility] CATH LAB;  Service: Cardiovascular;  Laterality: N/A;   ABDOMINAL AORTAGRAM N/A 08/15/2012   Procedure: ABDOMINAL EZELLA;  Surgeon: Gaile LELON New, MD;  Location: [facility] CATH LAB;   Service: Cardiovascular;  Laterality: N/A;   ABDOMINAL AORTOGRAM W/LOWER EXTREMITY Left 11/17/2021   Procedure: ABDOMINAL AORTOGRAM W/LOWER EXTREMITY;  Surgeon: New Gaile LELON, MD;  Location: [facility] INVASIVE CV LAB;  Service: Cardiovascular;  Laterality: Left;   ABDOMINAL AORTOGRAM W/LOWER EXTREMITY N/A 03/09/2022   Procedure: ABDOMINAL AORTOGRAM W/LOWER EXTREMITY;  Surgeon: New Gaile LELON, MD;  Location: [facility] INVASIVE CV LAB;  Service: Cardiovascular;  Laterality: N/A;   ABDOMINAL AORTOGRAM W/LOWER EXTREMITY N/A 03/15/2023   Procedure: ABDOMINAL AORTOGRAM W/LOWER EXTREMITY;  Surgeon: New Gaile LELON, MD;  Location: [facility] INVASIVE CV LAB;  Service: Cardiovascular;  Laterality: N/A;   ABDOMINAL HYSTERECTOMY  1984   AV FISTULA PLACEMENT Right 12/21/2023   Procedure: RIGHT ARM BRACHIOCEPHALIC ARTERIOVENOUS (AV) FISTULA CREATION;  Surgeon: New Gaile LELON, MD;  Location: [facility] OR;  Service: Vascular;  Laterality: Right;   BREAST SURGERY     cataract surgery Right 10/22/2015   cataract surgery Left 11/05/2015   CESAREAN SECTION     CHOLECYSTECTOMY     COLONOSCOPY  07/2010   DENTAL SURGERY  05/05/2012   left lower    ESOPHAGOGASTRODUODENOSCOPY  07/2010   EYE SURGERY  07/2013   Laser-Glaucoma   FEMORAL ARTERY STENT  05/11/2011   Left superficial femoral and popliteal artery   FEMORAL-POPLITEAL BYPASS GRAFT Left 11/18/2021   Procedure: LEFT FEMORAL-POPLITEAL ARTERY BYPASS GRAFT;  Surgeon: New Gaile LELON, MD;  Location: [facility] OR;  Service: Vascular;  Laterality: Left;   FISTULA SUPERFICIALIZATION Right 02/01/2024   Procedure: RIGHT AV FISTULA SUPERFICIALIZATION;  Surgeon: New Gaile LELON, MD;  Location: [facility] OR;  Service: Vascular;  Laterality: Right;   HERNIA REPAIR     times two   KNEE SURGERY     LOWER EXTREMITY ANGIOGRAM Left 08/15/2012   Procedure: LOWER EXTREMITY ANGIOGRAM;  Surgeon: Gaile LELON New, MD;  Location: [facility] CATH LAB;  Service: Cardiovascular;  Laterality: Left;  lt leg angio   PERIPHERAL  VASCULAR BALLOON ANGIOPLASTY Left 03/09/2022   Procedure: PERIPHERAL VASCULAR BALLOON ANGIOPLASTY;  Surgeon: New Gaile LELON, MD;  Location: [facility] INVASIVE CV LAB;  Service: Cardiovascular;  Laterality: Left;   PERIPHERAL VASCULAR BALLOON ANGIOPLASTY Left 03/15/2023   Procedure: PERIPHERAL VASCULAR BALLOON ANGIOPLASTY;  Surgeon: New Gaile LELON, MD;  Location: [facility] INVASIVE CV LAB;  Service: Cardiovascular;  Laterality: Left;   rotator cuff surgery     THYROID  SURGERY      Social History   Socioeconomic History   Marital status: Married    Spouse name: Not on file   Number of children: 2   Years of education: Not on file   Highest education level: 12th grade  Occupational History   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  Tobacco Use   Smoking status: Every Day    Current packs/day: 0.25    Average packs/day: 0.4 packs/day for 48.7 years (20.2 ttl pk-yrs)    Types: Cigarettes    Start date: 11/30/2023    Passive exposure: Never   Smokeless tobacco: Never   Tobacco comments:    11/18/20 in process of quiting    03/31/23 5 cigarettes a day   Vaping Use   Vaping status: Never Used  Substance and Sexual Activity   Alcohol  use: Never   Drug use: Never   Sexual activity: Not Currently    Birth control/protection: None, Surgical    Comment: Hysterctomy  Other Topics Concern   Not on file  Social History Narrative   Lives with husband.        Social Drivers of Corporate Investment Banker Strain: Low Risk  (09/19/2023)   Overall Financial Resource Strain (CARDIA)    Difficulty of Paying Living Expenses: Not hard at all  Food Insecurity: No Food Insecurity (05/03/2024)   Hunger Vital Sign    Worried About Running Out of Food in the Last Year: Never true    Ran Out of Food in the Last Year: Never true  Transportation Needs: No Transportation Needs (05/03/2024)   PRAPARE - Administrator, Civil Service (Medical): No    Lack of Transportation (Non-Medical): No   Physical Activity: Unknown (09/19/2023)   Exercise Vital Sign    Days of Exercise per Week: 0 days    Minutes of Exercise per Session: Not on file  Stress: Stress Concern Present (09/19/2023)   [employer] of Occupational Health - Occupational Stress Questionnaire    Feeling of Stress : Very much  Social Connections: Socially Integrated (04/29/2024)   Social Connection and Isolation Panel    Frequency of Communication with Friends and Family: More than three times a week    Frequency of Social Gatherings with Friends and Family: More than three times a week    Attends Religious Services: More than 4 times per year    Active Member of [employer] or Organizations: Not on file    Attends Banker Meetings: 1 to 4 times per year    Marital Status: Married  Catering Manager Violence: Not At Risk (05/03/2024)   Humiliation, Afraid, Rape, and Kick questionnaire    Fear of Current or Ex-Partner: No    Emotionally Abused: No    Physically Abused: No    Sexually Abused: No    Family History  Problem Relation Age of Onset   CAD Mother 49       Died of MI   Hypertension Mother    Heart attack Mother    Heart disease Mother    CAD Father 33       Died of MI   Heart disease Father    Heart attack Father    CAD Brother 38       Two brothers died of MI   Heart attack Brother    Heart disease Brother        Amputation   Diabetes Sister    Hypertension Sister    Heart attack Brother    Heart disease Brother    Stroke Sister    Colon cancer Neg Hx    Stomach cancer Neg Hx    Esophageal cancer Neg Hx     Current Outpatient Medications  Medication Sig  Dispense Refill   Accu-Chek Softclix Lancets lancets Use as instructed 3x a day 300 each 3   acetaminophen  (TYLENOL ) 500 MG tablet Take 1,000 mg by mouth 2 (two) times daily.     aspirin  EC 81 MG tablet Take 1 tablet (81 mg total) by mouth daily. Swallow whole. 30 tablet 12   Blood Glucose Monitoring Suppl (ACCU-CHEK GUIDE)  w/Device KIT Use as advised 1 kit 0   busPIRone  (BUSPAR ) 5 MG tablet Take 1 tablet (5 mg total) by mouth 2 (two) times daily.     calcitRIOL  (ROCALTROL ) 0.5 MCG capsule Take 0.5 mcg by mouth in the morning.     Fluticasone -Umeclidin-Vilant (TRELEGY ELLIPTA ) 200-62.5-25 MCG/ACT AEPB Inhale 1 puff into the lungs daily. 3 each 3   glucose blood (ACCU-CHEK GUIDE TEST) test strip Use to check blood sugar 3 times a day. 300 strip 12   hydrALAZINE  (APRESOLINE ) 100 MG tablet Take 1 tablet (100 mg total) by mouth every 8 (eight) hours. 270 tablet 3   insulin  aspart (NOVOLOG  FLEXPEN) 100 UNIT/ML FlexPen Inject 10-14 Units into the skin 3 (three) times daily before meals. 45 mL 3   insulin  degludec (TRESIBA  FLEXTOUCH) 200 UNIT/ML FlexTouch Pen Inject 40 Units into the skin daily. 18 mL 3   ipratropium-albuterol  (DUONEB) 0.5-2.5 (3) MG/3ML SOLN Take 3 mLs by nebulization every 4 (four) hours as needed (Shortness of breath or Wheezing). 360 mL 2   isosorbide  mononitrate (IMDUR ) 60 MG 24 hr tablet Take 1 tablet (60 mg total) by mouth daily. 30 tablet 0   metoprolol  tartrate (LOPRESSOR ) 25 MG tablet Take 1 tablet (25 mg total) by mouth 2 (two) times daily. 180 tablet 3   Omega-3 Fatty Acids (FISH OIL) 1000 MG CAPS Take 1,000 mg by mouth in the morning and at bedtime.     pantoprazole  (PROTONIX ) 40 MG tablet Take 1 tablet (40 mg total) by mouth 2 (two) times daily. 60 tablet 0   Potassium Chloride  ER 20 MEQ TBCR Take 2 tablets (40 mEq total) by mouth every other day. 60 tablet 0   rOPINIRole  (REQUIP ) 0.25 MG tablet Take 1 tablet (0.25 mg total) by mouth at bedtime. Take 0.25 mg by mouth at bedtime. 90 tablet 1   rosuvastatin  (CRESTOR ) 10 MG tablet Take 1 tablet (10 mg total) by mouth daily. 90 tablet 3   spironolactone (ALDACTONE) 25 MG tablet Take 25 mg by mouth daily.     torsemide (DEMADEX) 100 MG tablet 1 (ONE) TABLET BY MOUTH TWO TIMES DAILY FOR VOLUME OVERLOAD     No current facility-administered  medications for this visit.    Allergies  Allergen Reactions   Ciprofloxacin  Swelling   Ozempic  (0.25 Or 0.5 Mg-Dose) [Semaglutide (0.25 Or 0.5mg -Dos)] Nausea And Vomiting   Plavix [Clopidogrel Bisulfate] Palpitations   Amoxicillin Itching and Swelling    FACE & EYES SWELL   Ace Inhibitors Cough    Sore throat   Atorvastatin      myalgias   Lyrica [Pregabalin] Itching and Nausea Only     gain weight   Penicillins Other (See Comments)    Pt unsure if there is an allergic reaction      REVIEW OF SYSTEMS:  Negative unless noted in HPI [X]  denotes positive finding, [ ]  denotes negative finding Cardiac  Comments:  Chest pain or chest pressure:    Shortness of breath upon exertion:    Short of breath when lying flat:    Irregular heart rhythm:  Vascular    Pain in calf, thigh, or hip brought on by ambulation:    Pain in feet at night that wakes you up from your sleep:     Blood clot in your veins:    Leg swelling:         Pulmonary    Oxygen at home:    Productive cough:     Wheezing:         Neurologic    Sudden weakness in arms or legs:     Sudden numbness in arms or legs:     Sudden onset of difficulty speaking or slurred speech:    Temporary loss of vision in one eye:     Problems with dizziness:         Gastrointestinal    Blood in stool:     Vomited blood:         Genitourinary    Burning when urinating:     Blood in urine:        Psychiatric    Major depression:         Hematologic    Bleeding problems:    Problems with blood clotting too easily:        Skin    Rashes or ulcers:        Constitutional    Fever or chills:      PHYSICAL EXAMINATION:  Vitals:   07/26/24 1320  BP: (!) 201/63  Pulse: 63  Temp: 97.7 F (36.5 C)  TempSrc: Temporal  Weight: 174 lb 12.8 oz (79.3 kg)   General:  WDWN in NAD; vital signs documented above Gait: uses rollator HENT: WNL, normocephalic Pulmonary: normal non-labored breathing Cardiac: regular  HR Abdomen: soft Vascular Exam/Pulses: 2+ femoral, no palpable distal pulses, feet are warm and well perfused Extremities: without ischemic changes, without Gangrene , without cellulitis; without open wounds;  Musculoskeletal: no muscle wasting or atrophy  Neurologic: A&O X 3 Psychiatric:  The pt has Normal affect.   Non-Invasive Vascular Imaging:   VAS US  Carotid Duplex: Summary:  Right Carotid: Velocities in the right ICA are consistent with a 40-59% stenosis. Non-hemodynamically significant plaque <50% noted in the CCA.   Left Carotid: Velocities in the left ICA are consistent with a 1-39% stenosis. Hemodynamically significant plaque >50% visualized in the CCA.   Vertebrals: Bilateral vertebral arteries demonstrate antegrade flow.  Subclavians: Right subclavian artery flow was disturbed/turbulent due to presence of AVF. Normal flow hemodynamics were seen in the left subclavian artery.   VAS US  Lower extremity bypass graft Duplex: Summary:  Left: Patent left femoral to below-knee popliteal bypass graft with 50-74% inflow stenosis, distal anastomosis >70% stenosis and outflow 50-74% stenosis.   +-------+-----------+-----------+------------+------------+  ABI/TBIToday's ABIToday's TBIPrevious ABIPrevious TBI  +-------+-----------+-----------+------------+------------+  Right 0.69       0.36       0.48        0.28          +-------+-----------+-----------+------------+------------+  Left  [place]         0.48       0.62        0.39          +-------+-----------+-----------+------------+------------+   ASSESSMENT/PLAN:: 78 y.o. female here for follow up for PAD, ESRD on HD and Carotid artery stenosis.  Carotid Artery stenosis - She remains without any TIA or stroke like symptoms - Her bilateral loss of vision is not typical for carotid disease. I suspect this is more neurological. Recommend that she  follow up with Neurologist for further evaluation - Duplex today is  overall stable with 40-59% R ICA stenosis, left 1-39% - Continue Aspirin  and Statin - She will have repeat duplex in 1 year  ESRD on HD since her last visit - She started HD in August. Dialyzes on MWF - Fistula functioning well - Had two episodes of bleeding this week but it was same tech and she feels tech was just not paying attention when removing the needles  PAD - Having bilateral leg pain. Not typical claudication pain but hard to really know as she is having significant knee pain bilaterally that is limiting her mobility. No rest pain or tissue loss - Duplex shows both recurrent inflow and outflow bypass graft stenosis concerning for threatened bypass with distal velocities >600 - ABI is up slightly on the right and now non compressible on the left. Monophasic waveforms bilaterally. Toe pressures are actually improved compared to last ABI - With her duplex findings and concern for threatened bypass I have recommended Angiogram to hopefully prevent bypass graft occlusion. I will arrange this in the near future with Dr. Serene on a non dialysis day   Teretha Damme, PA-C [facility] [phone]  Clinic MD:

## 2024-07-27 DIAGNOSIS — N186 End stage renal disease: Secondary | ICD-10-CM | POA: Diagnosis not present

## 2024-07-27 DIAGNOSIS — E876 Hypokalemia: Secondary | ICD-10-CM | POA: Diagnosis not present

## 2024-07-27 DIAGNOSIS — E1122 Type 2 diabetes mellitus with diabetic chronic kidney disease: Secondary | ICD-10-CM | POA: Diagnosis not present

## 2024-07-27 DIAGNOSIS — N2581 Secondary hyperparathyroidism of renal origin: Secondary | ICD-10-CM | POA: Diagnosis not present

## 2024-07-27 DIAGNOSIS — D509 Iron deficiency anemia, unspecified: Secondary | ICD-10-CM | POA: Diagnosis not present

## 2024-07-27 DIAGNOSIS — Z992 Dependence on renal dialysis: Secondary | ICD-10-CM | POA: Diagnosis not present

## 2024-07-30 DIAGNOSIS — E1122 Type 2 diabetes mellitus with diabetic chronic kidney disease: Secondary | ICD-10-CM | POA: Diagnosis not present

## 2024-07-30 DIAGNOSIS — Z992 Dependence on renal dialysis: Secondary | ICD-10-CM | POA: Diagnosis not present

## 2024-07-30 DIAGNOSIS — D509 Iron deficiency anemia, unspecified: Secondary | ICD-10-CM | POA: Diagnosis not present

## 2024-07-30 DIAGNOSIS — N186 End stage renal disease: Secondary | ICD-10-CM | POA: Diagnosis not present

## 2024-07-30 DIAGNOSIS — N2581 Secondary hyperparathyroidism of renal origin: Secondary | ICD-10-CM | POA: Diagnosis not present

## 2024-07-30 DIAGNOSIS — E876 Hypokalemia: Secondary | ICD-10-CM | POA: Diagnosis not present

## 2024-07-31 ENCOUNTER — Other Ambulatory Visit: Payer: Self-pay

## 2024-07-31 ENCOUNTER — Ambulatory Visit (HOSPITAL_COMMUNITY)

## 2024-07-31 ENCOUNTER — Ambulatory Visit (HOSPITAL_COMMUNITY)
Admission: RE | Admit: 2024-07-31 | Discharge: 2024-07-31 | Disposition: A | Discharge status: Home / Self Care | Attending: Surgery | Admitting: Surgery

## 2024-07-31 ENCOUNTER — Encounter (HOSPITAL_COMMUNITY)
Admission: RE | Disposition: A | Discharge status: Home / Self Care | Payer: Self-pay | Source: Home / Self Care | Attending: Surgery

## 2024-07-31 DIAGNOSIS — Z7982 Long term (current) use of aspirin: Secondary | ICD-10-CM | POA: Diagnosis not present

## 2024-07-31 DIAGNOSIS — I70302 Unspecified atherosclerosis of unspecified type of bypass graft(s) of the extremities, left leg: Secondary | ICD-10-CM | POA: Diagnosis not present

## 2024-07-31 DIAGNOSIS — T82392A Other mechanical complication of femoral arterial graft (bypass), initial encounter: Secondary | ICD-10-CM | POA: Diagnosis not present

## 2024-07-31 DIAGNOSIS — E1151 Type 2 diabetes mellitus with diabetic peripheral angiopathy without gangrene: Secondary | ICD-10-CM | POA: Insufficient documentation

## 2024-07-31 DIAGNOSIS — Z95828 Presence of other vascular implants and grafts: Secondary | ICD-10-CM | POA: Insufficient documentation

## 2024-07-31 DIAGNOSIS — Z992 Dependence on renal dialysis: Secondary | ICD-10-CM | POA: Diagnosis not present

## 2024-07-31 DIAGNOSIS — R0602 Shortness of breath: Secondary | ICD-10-CM | POA: Diagnosis not present

## 2024-07-31 DIAGNOSIS — I132 Hypertensive heart and chronic kidney disease with heart failure and with stage 5 chronic kidney disease, or end stage renal disease: Secondary | ICD-10-CM | POA: Insufficient documentation

## 2024-07-31 DIAGNOSIS — T82858A Stenosis of vascular prosthetic devices, implants and grafts, initial encounter: Secondary | ICD-10-CM | POA: Insufficient documentation

## 2024-07-31 DIAGNOSIS — Y832 Surgical operation with anastomosis, bypass or graft as the cause of abnormal reaction of the patient, or of later complication, without mention of misadventure at the time of the procedure: Secondary | ICD-10-CM | POA: Diagnosis not present

## 2024-07-31 DIAGNOSIS — F17210 Nicotine dependence, cigarettes, uncomplicated: Secondary | ICD-10-CM | POA: Diagnosis not present

## 2024-07-31 DIAGNOSIS — Z79899 Other long term (current) drug therapy: Secondary | ICD-10-CM | POA: Insufficient documentation

## 2024-07-31 DIAGNOSIS — N186 End stage renal disease: Secondary | ICD-10-CM | POA: Diagnosis not present

## 2024-07-31 DIAGNOSIS — I70219 Atherosclerosis of native arteries of extremities with intermittent claudication, unspecified extremity: Secondary | ICD-10-CM

## 2024-07-31 DIAGNOSIS — E1122 Type 2 diabetes mellitus with diabetic chronic kidney disease: Secondary | ICD-10-CM | POA: Insufficient documentation

## 2024-07-31 DIAGNOSIS — Z794 Long term (current) use of insulin: Secondary | ICD-10-CM | POA: Diagnosis not present

## 2024-07-31 DIAGNOSIS — I6523 Occlusion and stenosis of bilateral carotid arteries: Secondary | ICD-10-CM | POA: Insufficient documentation

## 2024-07-31 HISTORY — PX: LOWER EXTREMITY ANGIOGRAPHY: CATH118251

## 2024-07-31 HISTORY — PX: ABDOMINAL AORTOGRAM W/LOWER EXTREMITY: CATH118223

## 2024-07-31 HISTORY — PX: LOWER EXTREMITY INTERVENTION: CATH118252

## 2024-07-31 LAB — POCT I-STAT, CHEM 8
BUN: 18 mg/dL (ref 8–23)
Calcium, Ion: 1.22 mmol/L (ref 1.15–1.40)
Chloride: 99 mmol/L (ref 98–111)
Creatinine, Ser: 4.1 mg/dL — ABNORMAL HIGH (ref 0.44–1.00)
Glucose, Bld: 155 mg/dL — ABNORMAL HIGH (ref 70–99)
HCT: 32 % — ABNORMAL LOW (ref 36.0–46.0)
Hemoglobin: 10.9 g/dL — ABNORMAL LOW (ref 12.0–15.0)
Potassium: 3.6 mmol/L (ref 3.5–5.1)
Sodium: 139 mmol/L (ref 135–145)
TCO2: 28 mmol/L (ref 22–32)

## 2024-07-31 LAB — GLUCOSE, CAPILLARY: Glucose-Capillary: 129 mg/dL — ABNORMAL HIGH (ref 70–99)

## 2024-07-31 SURGERY — ABDOMINAL AORTOGRAM W/LOWER EXTREMITY
Anesthesia: LOCAL

## 2024-07-31 MED ORDER — FENTANYL CITRATE (PF) 100 MCG/2ML IJ SOLN
INTRAMUSCULAR | Status: AC
  Filled 2024-07-31: qty 2

## 2024-07-31 MED ORDER — HEPARIN SODIUM (PORCINE) 1000 UNIT/ML IJ SOLN
INTRAMUSCULAR | Status: DC | PRN
  Administered 2024-07-31: 8000 [IU] via INTRAVENOUS

## 2024-07-31 MED ORDER — IPRATROPIUM BROMIDE 0.02 % IN SOLN
0.5000 mg | Freq: Once | RESPIRATORY_TRACT | Status: AC
  Administered 2024-07-31: 0.5 mg via RESPIRATORY_TRACT
  Filled 2024-07-31: qty 2.5

## 2024-07-31 MED ORDER — IODIXANOL 320 MG/ML IV SOLN
INTRAVENOUS | Status: DC | PRN
Start: 2024-07-31 — End: 2024-07-31
  Administered 2024-07-31: 100 mL via INTRA_ARTERIAL

## 2024-07-31 MED ORDER — SODIUM CHLORIDE 0.9% FLUSH: 3.0000 mL | INTRAVENOUS | Status: DC | PRN

## 2024-07-31 MED ORDER — TICAGRELOR 90 MG PO TABS
ORAL_TABLET | ORAL | Status: DC | PRN
  Administered 2024-07-31: 90 mg via ORAL

## 2024-07-31 MED ORDER — SODIUM CHLORIDE 0.9% FLUSH: 3.0000 mL | Freq: Two times a day (BID) | INTRAVENOUS | Status: DC

## 2024-07-31 MED ORDER — MIDAZOLAM HCL (PF) 2 MG/2ML IJ SOLN
INTRAMUSCULAR | Status: DC | PRN
  Administered 2024-07-31: 2 mg via INTRAVENOUS

## 2024-07-31 MED ORDER — ONDANSETRON HCL 4 MG/2ML IJ SOLN
4.0000 mg | Freq: Four times a day (QID) | INTRAMUSCULAR | Status: DC | PRN
  Administered 2024-07-31: 4 mg via INTRAVENOUS
  Filled 2024-07-31: qty 2

## 2024-07-31 MED ORDER — ASPIRIN 81 MG PO TBEC: 81.0000 mg | DELAYED_RELEASE_TABLET | Freq: Every day | ORAL | Status: DC

## 2024-07-31 MED ORDER — HEPARIN SODIUM (PORCINE) 1000 UNIT/ML IJ SOLN
INTRAMUSCULAR | Status: AC
Start: 2024-07-31 — End: 2024-07-31
  Filled 2024-07-31: qty 10

## 2024-07-31 MED ORDER — MIDAZOLAM HCL 2 MG/2ML IJ SOLN
INTRAMUSCULAR | Status: AC
  Filled 2024-07-31: qty 2

## 2024-07-31 MED ORDER — SODIUM CHLORIDE 0.9 % IV SOLN: 250.0000 mL | INTRAVENOUS | Status: DC | PRN

## 2024-07-31 MED ORDER — LABETALOL HCL 5 MG/ML IV SOLN: 10.0000 mg | INTRAVENOUS | Status: DC | PRN

## 2024-07-31 MED ORDER — ASPIRIN 81 MG PO CHEW
CHEWABLE_TABLET | ORAL | Status: DC | PRN
  Administered 2024-07-31: 81 mg via ORAL

## 2024-07-31 MED ORDER — HYDRALAZINE HCL 20 MG/ML IJ SOLN
INTRAMUSCULAR | Status: AC
  Filled 2024-07-31: qty 1

## 2024-07-31 MED ORDER — LIDOCAINE HCL (PF) 1 % IJ SOLN
INTRAMUSCULAR | Status: AC
  Filled 2024-07-31: qty 30

## 2024-07-31 MED ORDER — ASPIRIN 81 MG PO CHEW
CHEWABLE_TABLET | ORAL | Status: AC
  Filled 2024-07-31: qty 1

## 2024-07-31 MED ORDER — HEPARIN (PORCINE) IN NACL 1000-0.9 UT/500ML-% IV SOLN
INTRAVENOUS | Status: DC | PRN
Start: 2024-07-31 — End: 2024-07-31
  Administered 2024-07-31: 1000 mL

## 2024-07-31 MED ORDER — TICAGRELOR 90 MG PO TABS: 90.0000 mg | ORAL_TABLET | Freq: Two times a day (BID) | ORAL | 0 refills | Status: AC

## 2024-07-31 MED ORDER — OXYCODONE HCL 5 MG PO TABS: 5.0000 mg | ORAL_TABLET | ORAL | Status: DC | PRN

## 2024-07-31 MED ORDER — ACETAMINOPHEN 325 MG PO TABS: 650.0000 mg | ORAL_TABLET | ORAL | Status: DC | PRN

## 2024-07-31 MED ORDER — HYDRALAZINE HCL 20 MG/ML IJ SOLN
INTRAMUSCULAR | Status: DC | PRN
  Administered 2024-07-31 (×3): 10 mg via INTRAVENOUS

## 2024-07-31 MED ORDER — ASPIRIN 81 MG PO TBEC: 81.0000 mg | DELAYED_RELEASE_TABLET | Freq: Every day | ORAL | 0 refills | Status: DC

## 2024-07-31 MED ORDER — FENTANYL CITRATE (PF) 100 MCG/2ML IJ SOLN
INTRAMUSCULAR | Status: DC | PRN
  Administered 2024-07-31: 50 ug via INTRAVENOUS

## 2024-07-31 MED ORDER — LIDOCAINE HCL (PF) 1 % IJ SOLN
INTRAMUSCULAR | Status: DC | PRN
  Administered 2024-07-31: 10 mL via INTRADERMAL

## 2024-07-31 MED ORDER — TICAGRELOR 90 MG PO TABS
ORAL_TABLET | ORAL | Status: AC
  Filled 2024-07-31: qty 1

## 2024-07-31 MED ORDER — MORPHINE SULFATE (PF) 2 MG/ML IV SOLN: 2.0000 mg | INTRAVENOUS | Status: DC | PRN

## 2024-07-31 MED ORDER — HYDRALAZINE HCL 20 MG/ML IJ SOLN: 5.0000 mg | INTRAMUSCULAR | Status: DC | PRN

## 2024-07-31 SURGICAL SUPPLY — 19 items
CATH ANGIO 5F BER2 100CM (CATHETERS) IMPLANT
CATH OMNI FLUSH 5F 65CM (CATHETERS) IMPLANT
CLOSURE MYNX CONTROL 6F/7F (Vascular Products) IMPLANT
DCB RANGER 4.0X100 135 (BALLOONS) IMPLANT
KIT ENCORE 26 ADVANTAGE (KITS) IMPLANT
KIT MICROPUNCTURE NIT STIFF (SHEATH) IMPLANT
KIT SINGLE USE MANIFOLD (KITS) IMPLANT
KIT SYRINGE INJ CVI SPIKEX1 (MISCELLANEOUS) IMPLANT
SET ATX-X65L (MISCELLANEOUS) IMPLANT
SHEATH CATAPULT 5F 45 MP (SHEATH) IMPLANT
SHEATH CATAPULT 6FR 45 (SHEATH) IMPLANT
SHEATH PINNACLE 5F 10CM (SHEATH) IMPLANT
SHEATH PINNACLE 6F 10CM (SHEATH) IMPLANT
SHEATH PROBE COVER 6X72 (BAG) IMPLANT
STENT VIABAHN 5X100X120 (Permanent Stent) IMPLANT
TRAY PV CATH (CUSTOM PROCEDURE TRAY) ×2 IMPLANT
WIRE BENTSON .035X145CM (WIRE) IMPLANT
WIRE G V18X300CM (WIRE) IMPLANT
WIRE HI TORQ VERSACORE 300 (WIRE) IMPLANT

## 2024-07-31 NOTE — Interval H&P Note (Signed)
 History and Physical Interval Note:  07/31/2024 7:41 AM  [name]  has presented today for surgery, with the diagnosis of left bypass graft threatened.  The various methods of treatment have been discussed with the patient and family. After consideration of risks, benefits and other options for treatment, the patient has consented to  Procedure(s): ABDOMINAL AORTOGRAM W/LOWER EXTREMITY (N/A) Lower Extremity Angiography (N/A) LOWER EXTREMITY INTERVENTION (N/A) as a surgical intervention.  The patient's history has been reviewed, patient examined, no change in status, stable for surgery.  I have reviewed the patient's chart and labs.  Questions were answered to the patient's satisfaction.     Malvina New

## 2024-07-31 NOTE — Progress Notes (Signed)
 Patient and husband was given discharge instructions. Both verbalized understanding.

## 2024-07-31 NOTE — Op Note (Signed)
 Patient name: [name] MRN: [id number] DOB: 1945/11/25 Sex: female  07/31/2024 Pre-operative Diagnosis: Left femoral-popliteal bypass graft stenosis Post-operative diagnosis:  Same Surgeon:  Malvina New Procedure Performed:  1.  Ultrasound-guided access, right femoral artery  2.  Abdominal aortogram  3.  Bilateral lower extremity angiogram  4.  Selective injection with catheter in left external iliac artery and left femoral-popliteal bypass graft  5.  Drug-coated balloon angioplasty, left femoral-popliteal bypass graft  6.  Stent, left femoral-popliteal bypass graft (Viabahn 5 x 100)  7.  Conscious sedation, 46 min  8.  Closure device, Mynx   Indications: This is a 78 year old female with history of femoral-popliteal bypass graft on the left who has developed recurrent bypass graft stenosis.  She comes in today for further evaluation  Procedure:  The patient was identified in the holding area and taken to room 8.  The patient was then placed supine on the table and prepped and draped in the usual sterile fashion.  A time out was called.  Conscious sedation was administered with the use of IV fentanyl  and Versed  under continuous physician and nurse monitoring.  Heart rate, blood pressure, and oxygen saturation were continuously monitored.  Total sedation time was 46 minutes.  Ultrasound was used to evaluate the right common femoral artery.  It was patent .  A digital ultrasound image was acquired.  A micropuncture needle was used to access the right common femoral artery under ultrasound guidance.  An 018 wire was advanced without resistance and a micropuncture sheath was placed.  The 018 wire was removed and a benson wire was placed.  The micropuncture sheath was exchanged for a 5 french sheath.  An omniflush catheter was advanced over the wire to the level of L-1.  An abdominal angiogram was obtained.  Next, using the omniflush catheter and a benson wire, the aortic bifurcation was  crossed and the catheter was placed into theleft external iliac artery and left runoff was obtained.  right runoff was performed via retrograde sheath injections.  Findings:   Aortogram: No significant renal artery stenosis.  The right infrarenal abdominal aorta was widely patent.  Bilateral common and external iliac arteries widely patent  Right Lower Extremity: The right common femoral profundofemoral artery widely patent.  The superficial femoral artery is occluded with reconstitution of the above-knee popliteal artery with two-vessel runoff via the posterior tibial peroneal  Left Lower Extremity: The left common femoral and profundofemoral artery are patent.  Stents within the left SFA are occluded.  There is a bypass graft that fills sluggishly.  In the distal portion of the bypass graft there are areas of aneurysmal degeneration associated with a greater than 90% stenosis.  There is three-vessel runoff  Intervention: After the above images were acquired the decision was made to proceed with intervention.  A 6 French sheath was inserted.  Selective injections were performed with a catheter in the bypass graft.  A V-18 wire was advanced across the lesion into the below-knee popliteal artery.  I elected to treat the stenosis within the bypass graft with a 4 x 100 drug-coated Ranger balloon at 12 atm for 3 minutes.  Once this was performed there was significantly improved blood flow through the bypass graft however the pseudoaneurysms persisted.  I elected to cover these with a 5 x 100 Viabahn which was postdilated with a 4 mm balloon.  Completion imaging revealed resolution of the pseudoaneurysmal changes within the bypass graft and the bypass graft remain  widely patent.  The groin was then closed with a Mynx  Impression:  #1  High-grade greater than 90% stenosis in the distal portion of the bypass graft, proximal to the anastomosis.  The stenosis was approximately 90% and was associated with 2 large  pseudoaneurysms.  The stenosis was dilated with a 4 mm Ranger balloon and then I covered the pseudoaneurysms with a 5 x 100 Viabahn  #2  Three-vessel runoff on the left leg.  Two-vessel runoff on the right via the posterior tibial and peroneal artery  #3  Right superficial femoral artery occlusion  #4  The patient is allergic to Plavix and so I will do Brilinta and aspirin  for 1 month and then 81 mg of aspirin     ALONSO Malvina New, M.D., [facility] [place] Office: [phone] Pager:  [phone]

## 2024-08-01 ENCOUNTER — Encounter (HOSPITAL_COMMUNITY): Payer: Self-pay | Admitting: Surgery

## 2024-08-01 DIAGNOSIS — N2581 Secondary hyperparathyroidism of renal origin: Secondary | ICD-10-CM | POA: Diagnosis not present

## 2024-08-01 DIAGNOSIS — Z992 Dependence on renal dialysis: Secondary | ICD-10-CM | POA: Diagnosis not present

## 2024-08-01 DIAGNOSIS — E876 Hypokalemia: Secondary | ICD-10-CM | POA: Diagnosis not present

## 2024-08-01 DIAGNOSIS — N186 End stage renal disease: Secondary | ICD-10-CM | POA: Diagnosis not present

## 2024-08-01 DIAGNOSIS — E1122 Type 2 diabetes mellitus with diabetic chronic kidney disease: Secondary | ICD-10-CM | POA: Diagnosis not present

## 2024-08-01 DIAGNOSIS — D509 Iron deficiency anemia, unspecified: Secondary | ICD-10-CM | POA: Diagnosis not present

## 2024-08-02 ENCOUNTER — Encounter: Payer: Self-pay | Admitting: Surgery

## 2024-08-02 ENCOUNTER — Telehealth: Payer: Self-pay

## 2024-08-02 DIAGNOSIS — L84 Corns and callosities: Secondary | ICD-10-CM | POA: Diagnosis not present

## 2024-08-02 DIAGNOSIS — M19071 Primary osteoarthritis, right ankle and foot: Secondary | ICD-10-CM | POA: Diagnosis not present

## 2024-08-02 DIAGNOSIS — M19072 Primary osteoarthritis, left ankle and foot: Secondary | ICD-10-CM | POA: Diagnosis not present

## 2024-08-02 DIAGNOSIS — M205X1 Other deformities of toe(s) (acquired), right foot: Secondary | ICD-10-CM | POA: Diagnosis not present

## 2024-08-02 DIAGNOSIS — I739 Peripheral vascular disease, unspecified: Secondary | ICD-10-CM | POA: Diagnosis not present

## 2024-08-02 DIAGNOSIS — E1151 Type 2 diabetes mellitus with diabetic peripheral angiopathy without gangrene: Secondary | ICD-10-CM | POA: Diagnosis not present

## 2024-08-02 DIAGNOSIS — G629 Polyneuropathy, unspecified: Secondary | ICD-10-CM | POA: Diagnosis not present

## 2024-08-02 DIAGNOSIS — M205X2 Other deformities of toe(s) (acquired), left foot: Secondary | ICD-10-CM | POA: Diagnosis not present

## 2024-08-02 NOTE — Telephone Encounter (Signed)
 I can not take Ticagrelor. I took 1 Wed. after procedure on Tues. I took Ticagelor and aspirn approx 3:30 when I got home from dialysis. Within 20 minutes I could hardly breath, my heart was pounding, I was nauseous and dizzy. It was around 9 before I started to feel a little better. Is there something else I can take, or, can I do slow cycling on my stationary bike to increase blook flow?   I asked her about her dialysis but she reported that she took a dose of Brilinta in PACU after her aortogram and had the same reaction.  Patient reports she is not going to take the Brilinta.  Advised to continue taking aspirin .  Dr. Serene consulted.  Awaiting response.

## 2024-08-03 DIAGNOSIS — N186 End stage renal disease: Secondary | ICD-10-CM | POA: Diagnosis not present

## 2024-08-03 DIAGNOSIS — D509 Iron deficiency anemia, unspecified: Secondary | ICD-10-CM | POA: Diagnosis not present

## 2024-08-03 DIAGNOSIS — Z992 Dependence on renal dialysis: Secondary | ICD-10-CM | POA: Diagnosis not present

## 2024-08-03 DIAGNOSIS — E1122 Type 2 diabetes mellitus with diabetic chronic kidney disease: Secondary | ICD-10-CM | POA: Diagnosis not present

## 2024-08-03 DIAGNOSIS — N2581 Secondary hyperparathyroidism of renal origin: Secondary | ICD-10-CM | POA: Diagnosis not present

## 2024-08-03 DIAGNOSIS — E876 Hypokalemia: Secondary | ICD-10-CM | POA: Diagnosis not present

## 2024-08-06 DIAGNOSIS — D509 Iron deficiency anemia, unspecified: Secondary | ICD-10-CM | POA: Diagnosis not present

## 2024-08-06 DIAGNOSIS — N186 End stage renal disease: Secondary | ICD-10-CM | POA: Diagnosis not present

## 2024-08-06 DIAGNOSIS — E1122 Type 2 diabetes mellitus with diabetic chronic kidney disease: Secondary | ICD-10-CM | POA: Diagnosis not present

## 2024-08-06 DIAGNOSIS — E876 Hypokalemia: Secondary | ICD-10-CM | POA: Diagnosis not present

## 2024-08-06 DIAGNOSIS — N2581 Secondary hyperparathyroidism of renal origin: Secondary | ICD-10-CM | POA: Diagnosis not present

## 2024-08-06 DIAGNOSIS — Z992 Dependence on renal dialysis: Secondary | ICD-10-CM | POA: Diagnosis not present

## 2024-08-07 ENCOUNTER — Encounter: Payer: Medicare Other | Admitting: Nurse Practitioner

## 2024-08-08 DIAGNOSIS — N186 End stage renal disease: Secondary | ICD-10-CM | POA: Diagnosis not present

## 2024-08-08 DIAGNOSIS — N2581 Secondary hyperparathyroidism of renal origin: Secondary | ICD-10-CM | POA: Diagnosis not present

## 2024-08-08 DIAGNOSIS — E1122 Type 2 diabetes mellitus with diabetic chronic kidney disease: Secondary | ICD-10-CM | POA: Diagnosis not present

## 2024-08-08 DIAGNOSIS — Z992 Dependence on renal dialysis: Secondary | ICD-10-CM | POA: Diagnosis not present

## 2024-08-08 DIAGNOSIS — D509 Iron deficiency anemia, unspecified: Secondary | ICD-10-CM | POA: Diagnosis not present

## 2024-08-08 DIAGNOSIS — E876 Hypokalemia: Secondary | ICD-10-CM | POA: Diagnosis not present

## 2024-08-10 DIAGNOSIS — E876 Hypokalemia: Secondary | ICD-10-CM | POA: Diagnosis not present

## 2024-08-10 DIAGNOSIS — E1122 Type 2 diabetes mellitus with diabetic chronic kidney disease: Secondary | ICD-10-CM | POA: Diagnosis not present

## 2024-08-10 DIAGNOSIS — D509 Iron deficiency anemia, unspecified: Secondary | ICD-10-CM | POA: Diagnosis not present

## 2024-08-10 DIAGNOSIS — Z992 Dependence on renal dialysis: Secondary | ICD-10-CM | POA: Diagnosis not present

## 2024-08-10 DIAGNOSIS — N186 End stage renal disease: Secondary | ICD-10-CM | POA: Diagnosis not present

## 2024-08-10 DIAGNOSIS — N2581 Secondary hyperparathyroidism of renal origin: Secondary | ICD-10-CM | POA: Diagnosis not present

## 2024-08-12 DIAGNOSIS — Z992 Dependence on renal dialysis: Secondary | ICD-10-CM | POA: Diagnosis not present

## 2024-08-12 DIAGNOSIS — E1122 Type 2 diabetes mellitus with diabetic chronic kidney disease: Secondary | ICD-10-CM | POA: Diagnosis not present

## 2024-08-12 DIAGNOSIS — N2581 Secondary hyperparathyroidism of renal origin: Secondary | ICD-10-CM | POA: Diagnosis not present

## 2024-08-12 DIAGNOSIS — N186 End stage renal disease: Secondary | ICD-10-CM | POA: Diagnosis not present

## 2024-08-12 DIAGNOSIS — D509 Iron deficiency anemia, unspecified: Secondary | ICD-10-CM | POA: Diagnosis not present

## 2024-08-12 DIAGNOSIS — E876 Hypokalemia: Secondary | ICD-10-CM | POA: Diagnosis not present

## 2024-08-13 ENCOUNTER — Other Ambulatory Visit: Payer: Self-pay

## 2024-08-13 DIAGNOSIS — I70219 Atherosclerosis of native arteries of extremities with intermittent claudication, unspecified extremity: Secondary | ICD-10-CM

## 2024-08-14 DIAGNOSIS — N186 End stage renal disease: Secondary | ICD-10-CM | POA: Diagnosis not present

## 2024-08-14 DIAGNOSIS — E1122 Type 2 diabetes mellitus with diabetic chronic kidney disease: Secondary | ICD-10-CM | POA: Diagnosis not present

## 2024-08-14 DIAGNOSIS — E876 Hypokalemia: Secondary | ICD-10-CM | POA: Diagnosis not present

## 2024-08-14 DIAGNOSIS — D509 Iron deficiency anemia, unspecified: Secondary | ICD-10-CM | POA: Diagnosis not present

## 2024-08-14 DIAGNOSIS — N2581 Secondary hyperparathyroidism of renal origin: Secondary | ICD-10-CM | POA: Diagnosis not present

## 2024-08-14 DIAGNOSIS — Z992 Dependence on renal dialysis: Secondary | ICD-10-CM | POA: Diagnosis not present

## 2024-08-17 DIAGNOSIS — N186 End stage renal disease: Secondary | ICD-10-CM | POA: Diagnosis not present

## 2024-08-17 DIAGNOSIS — D509 Iron deficiency anemia, unspecified: Secondary | ICD-10-CM | POA: Diagnosis not present

## 2024-08-17 DIAGNOSIS — Z992 Dependence on renal dialysis: Secondary | ICD-10-CM | POA: Diagnosis not present

## 2024-08-17 DIAGNOSIS — E1122 Type 2 diabetes mellitus with diabetic chronic kidney disease: Secondary | ICD-10-CM | POA: Diagnosis not present

## 2024-08-17 DIAGNOSIS — N2581 Secondary hyperparathyroidism of renal origin: Secondary | ICD-10-CM | POA: Diagnosis not present

## 2024-08-17 DIAGNOSIS — E876 Hypokalemia: Secondary | ICD-10-CM | POA: Diagnosis not present

## 2024-08-22 ENCOUNTER — Other Ambulatory Visit: Payer: Self-pay | Admitting: Surgery

## 2024-08-24 ENCOUNTER — Other Ambulatory Visit: Payer: Self-pay | Admitting: Nurse Practitioner

## 2024-08-27 ENCOUNTER — Ambulatory Visit

## 2024-08-27 ENCOUNTER — Other Ambulatory Visit (HOSPITAL_COMMUNITY)

## 2024-08-27 ENCOUNTER — Encounter (HOSPITAL_COMMUNITY)

## 2024-09-03 ENCOUNTER — Encounter (HOSPITAL_COMMUNITY)

## 2024-09-03 ENCOUNTER — Other Ambulatory Visit (HOSPITAL_COMMUNITY)

## 2024-09-03 ENCOUNTER — Ambulatory Visit

## 2024-09-10 ENCOUNTER — Ambulatory Visit (HOSPITAL_COMMUNITY)

## 2024-09-10 ENCOUNTER — Ambulatory Visit (HOSPITAL_COMMUNITY)
Admission: RE | Admit: 2024-09-10 | Discharge: 2024-09-10 | Disposition: A | Discharge status: Home / Self Care | Source: Ambulatory Visit | Attending: Surgery | Admitting: Surgery

## 2024-09-10 ENCOUNTER — Ambulatory Visit (INDEPENDENT_AMBULATORY_CARE_PROVIDER_SITE_OTHER): Admitting: Physician Assistant

## 2024-09-10 VITALS — BP 194/63 | HR 57 | Temp 97.7°F | Wt 172.5 lb

## 2024-09-10 DIAGNOSIS — I739 Peripheral vascular disease, unspecified: Secondary | ICD-10-CM | POA: Insufficient documentation

## 2024-09-10 DIAGNOSIS — I70212 Atherosclerosis of native arteries of extremities with intermittent claudication, left leg: Secondary | ICD-10-CM

## 2024-09-10 DIAGNOSIS — I6523 Occlusion and stenosis of bilateral carotid arteries: Secondary | ICD-10-CM | POA: Insufficient documentation

## 2024-09-10 DIAGNOSIS — I70402 Unspecified atherosclerosis of autologous vein bypass graft(s) of the extremities, left leg: Secondary | ICD-10-CM | POA: Insufficient documentation

## 2024-09-10 DIAGNOSIS — I70219 Atherosclerosis of native arteries of extremities with intermittent claudication, unspecified extremity: Secondary | ICD-10-CM

## 2024-09-10 LAB — VAS US ABI WITH/WO TBI: Right ABI: 0.82

## 2024-09-11 NOTE — Progress Notes (Signed)
 "   Office Note   History of Present Illness   [name] is a 78 y.o. (1946-08-23) female who presents for follow-up.  She recently underwent left lower extremity angiogram with drug-coated balloon angioplasty and Viabahn stenting of the distal aspect of her left femoropopliteal bypass graft on 07/31/2024 by Dr. Serene.  This was done for a threatened left femoral to below-knee popliteal artery bypass graft with high-grade distal stenosis and 2 large pseudoaneurysms.  At completion, her bypass was widely patent with three-vessel runoff on the left.  She was to take Brilinta  and aspirin  for 1 month and then aspirin  indefinitely.  She also has previous left SFA stents that are known to be occluded.  She also has known carotid stenosis that is asymptomatic.  She returns today for follow-up.  She says she is doing okay.  She continues to have pain in her knees when she walks due to osteoarthritis on the right and a Baker's cyst on the left.  She has been evaluated by orthopedics and they have recommended cortisone injections versus knee replacement on the right and drainage of the Baker's cyst on the left. She denies any claudication, rest pain, or tissue loss.  She says that she was unable to tolerate the Brilinta  due to extreme dizziness.  She was instructed by Dr. Serene to take aspirin  325 mg twice daily instead.  She says that she has been tolerating this well.  Current Outpatient Medications  Medication Sig Dispense Refill   Accu-Chek Softclix Lancets lancets Use as instructed 3x a day 300 each 3   acetaminophen  (TYLENOL ) 500 MG tablet Take 1,000 mg by mouth 2 (two) times daily as needed.     aspirin  EC 81 MG tablet Take 1 tablet (81 mg total) by mouth daily. Swallow whole. 30 tablet 12   ASPIRIN  LOW DOSE 81 MG tablet TAKE 1 TABLET (81 MG TOTAL) BY MOUTH DAILY. SWALLOW WHOLE. 90 tablet 1   Blood Glucose Monitoring Suppl (ACCU-CHEK GUIDE) w/Device KIT Use as advised 1 kit 0   busPIRone   (BUSPAR ) 5 MG tablet Take 1 tablet (5 mg total) by mouth 2 (two) times daily. (Patient not taking: Reported on 07/26/2024)     calcitRIOL  (ROCALTROL ) 0.5 MCG capsule Take 0.5 mcg by mouth in the morning.     cephALEXin  (KEFLEX ) 250 MG capsule Take 250 mg by mouth 2 (two) times daily.     cloNIDine  (CATAPRES ) 0.1 MG tablet Take 0.1 mg by mouth at bedtime.     Fluticasone -Umeclidin-Vilant (TRELEGY ELLIPTA ) 200-62.5-25 MCG/ACT AEPB Inhale 1 puff into the lungs daily. 3 each 3   glucose blood (ACCU-CHEK GUIDE TEST) test strip Use to check blood sugar 3 times a day. 300 strip 12   hydrALAZINE  (APRESOLINE ) 100 MG tablet Take 1 tablet (100 mg total) by mouth every 8 (eight) hours. (Patient taking differently: Take 100 mg by mouth 2 (two) times daily.) 270 tablet 3   insulin  aspart (NOVOLOG  FLEXPEN) 100 UNIT/ML FlexPen Inject 10-14 Units into the skin 3 (three) times daily before meals. (Patient taking differently: Inject 5-8 Units into the skin 3 (three) times daily before meals.) 45 mL 3   insulin  degludec (TRESIBA  FLEXTOUCH) 200 UNIT/ML FlexTouch Pen Inject 40 Units into the skin daily. (Patient taking differently: Inject 20 Units into the skin daily.) 18 mL 3   ipratropium-albuterol  (DUONEB) 0.5-2.5 (3) MG/3ML SOLN Take 3 mLs by nebulization every 4 (four) hours as needed (Shortness of breath or Wheezing). 360 mL 2  isosorbide  mononitrate (IMDUR ) 60 MG 24 hr tablet Take 1 tablet (60 mg total) by mouth daily. 30 tablet 0   metoprolol  tartrate (LOPRESSOR ) 25 MG tablet Take 1 tablet (25 mg total) by mouth 2 (two) times daily. 180 tablet 3   Omega-3 Fatty Acids (FISH OIL) 1000 MG CAPS Take 1,000 mg by mouth every morning.     pantoprazole  (PROTONIX ) 40 MG tablet Take 1 tablet (40 mg total) by mouth 2 (two) times daily. 60 tablet 0   rOPINIRole  (REQUIP ) 0.25 MG tablet TAKE 1 TABLET (0.25 MG TOTAL) BY MOUTH AT BEDTIME. TAKE 0.25 MG BY MOUTH AT BEDTIME. 90 tablet 1   rosuvastatin  (CRESTOR ) 10 MG tablet Take 1  tablet (10 mg total) by mouth daily. 90 tablet 3   spironolactone (ALDACTONE) 25 MG tablet Take 25 mg by mouth daily.     torsemide (DEMADEX) 100 MG tablet Take 100 mg by mouth daily.     VELPHORO 500 MG chewable tablet Chew 1 tablet by mouth 3 (three) times daily with meals.     No current facility-administered medications for this visit.    REVIEW OF SYSTEMS (negative unless checked):   Cardiac:  []  Chest pain or chest pressure? []  Shortness of breath upon activity? []  Shortness of breath when lying flat? []  Irregular heart rhythm?  Vascular:  []  Pain in calf, thigh, or hip brought on by walking? []  Pain in feet at night that wakes you up from your sleep? []  Blood clot in your veins? []  Leg swelling?  Pulmonary:  []  Oxygen at home? []  Productive cough? []  Wheezing?  Neurologic:  []  Sudden weakness in arms or legs? []  Sudden numbness in arms or legs? []  Sudden onset of difficult speaking or slurred speech? []  Temporary loss of vision in one eye? []  Problems with dizziness?  Gastrointestinal:  []  Blood in stool? []  Vomited blood?  Genitourinary:  []  Burning when urinating? []  Blood in urine?  Psychiatric:  []  Major depression  Hematologic:  []  Bleeding problems? []  Problems with blood clotting?  Dermatologic:  []  Rashes or ulcers?  Constitutional:  []  Fever or chills?  Ear/Nose/Throat:  []  Change in hearing? []  Nose bleeds? []  Sore throat?  Musculoskeletal:  []  Back pain? [x]  Joint pain? []  Muscle pain?   Physical Examination   Vitals:   09/10/24 1044  BP: (!) 194/63  Pulse: (!) 57  Temp: 97.7 F (36.5 C)  TempSrc: Temporal  Weight: 172 lb 8 oz (78.2 kg)   Body mass index is 27.84 kg/m.  General:  WDWN in NAD; vital signs documented above Gait: Not observed HENT: WNL, normocephalic Pulmonary: normal non-labored breathing , Cardiac: regular Abdomen: soft, NT, no masses Skin: without rashes Vascular Exam/Pulses: Palpable femoral  pulses bilaterally.  Brisk left DP/PT Doppler signals Extremities: without ischemic changes, without gangrene , without cellulitis; without open wounds;  Musculoskeletal: no muscle wasting or atrophy  Neurologic: A&O X 3;  No focal weakness or paresthesias are detected Psychiatric:  The pt has normal affect  Non-Invasive Vascular imaging   ABI (09/10/2024) R:  ABI: 0.82 (0.69),  PT: bi DP: mono TBI:  0.43 L:  ABI: [place] ([place]),  PT: tri DP: tri TBI: 0.79   LLE Arterial Duplex (09/10/2024) Patent left common femoral to below-knee popliteal artery bypass graft without stenosis.  Patent stent within the distal aspect of the bypass graft.  Known occlusion of left SFA and previous SFA stents.  Medical Decision Making   [name] is a 78  y.o. female who presents for follow-up  Based on the patient's vascular studies, her ABIs on the right have improved from 0.69 to 0.82.  Her ABIs on the left remain noncompressible.  Tibial vessel flow on the left has improved from monophasic to triphasic.  Her toe pressures on the left have also improved from 0.48 to 0.79 Left lower extremity arterial duplex demonstrates a patent left common femoral to below-knee popliteal artery bypass graft without stenosis.  There appears to be patent stents within the distal aspect of the bypass graft.  She has a known occlusion of her left SFA and known occlusion of previous left SFA stents. She has not been ambulating much recently due to severe knee pain bilaterally.  She is currently being evaluated by orthopedic doctors and will require Baker's cyst drainage on the left and cortisone injections versus knee replacement on the right.  She is stable from a vascular perspective to pursue orthopedic treatment. She denies any claudication, rest pain, or tissue loss.  She has brisk left DP/PT Doppler signals. Given the patient's several previous left lower extremity interventions, she would benefit from continuing  aspirin  325 mg twice daily.  This can be paused as needed for any future orthopedic interventions.  Otherwise she can follow-up with our office in 3 months with left lower extremity bypass graft duplex and ABIs   Ahmed Holster PA-C [facility] [place] Office: [phone]  Clinic MD: Serene "

## 2024-09-12 ENCOUNTER — Other Ambulatory Visit: Payer: Self-pay | Admitting: *Deleted

## 2024-09-12 DIAGNOSIS — I70402 Unspecified atherosclerosis of autologous vein bypass graft(s) of the extremities, left leg: Secondary | ICD-10-CM

## 2024-09-12 DIAGNOSIS — I739 Peripheral vascular disease, unspecified: Secondary | ICD-10-CM

## 2024-10-09 ENCOUNTER — Other Ambulatory Visit: Payer: Self-pay | Admitting: Pulmonary Disease

## 2024-10-10 ENCOUNTER — Ambulatory Visit: Admitting: Internal Medicine

## 2024-10-23 ENCOUNTER — Encounter (HOSPITAL_COMMUNITY): Payer: Self-pay

## 2024-10-25 ENCOUNTER — Ambulatory Visit (HOSPITAL_COMMUNITY)
Admission: RE | Admit: 2024-10-25 | Discharge: 2024-10-25 | Disposition: A | Discharge status: Home / Self Care | Attending: Surgery | Admitting: Surgery

## 2024-10-25 ENCOUNTER — Other Ambulatory Visit: Payer: Self-pay

## 2024-10-25 ENCOUNTER — Encounter (HOSPITAL_COMMUNITY)
Admission: RE | Disposition: A | Discharge status: Home / Self Care | Payer: Self-pay | Source: Home / Self Care | Attending: Surgery

## 2024-10-25 DIAGNOSIS — T82858A Stenosis of vascular prosthetic devices, implants and grafts, initial encounter: Secondary | ICD-10-CM | POA: Diagnosis not present

## 2024-10-25 DIAGNOSIS — Z992 Dependence on renal dialysis: Secondary | ICD-10-CM | POA: Diagnosis not present

## 2024-10-25 DIAGNOSIS — N186 End stage renal disease: Secondary | ICD-10-CM | POA: Diagnosis not present

## 2024-10-25 LAB — GLUCOSE, CAPILLARY: Glucose-Capillary: 350 mg/dL — ABNORMAL HIGH (ref 70–99)

## 2024-10-25 MED ORDER — HEPARIN (PORCINE) IN NACL 1000-0.9 UT/500ML-% IV SOLN
INTRAVENOUS | Status: DC | PRN
  Administered 2024-10-25: 500 mL

## 2024-10-25 MED ORDER — LIDOCAINE HCL (PF) 1 % IJ SOLN
INTRAMUSCULAR | Status: AC
  Filled 2024-10-25: qty 30

## 2024-10-25 MED ORDER — LIDOCAINE HCL (PF) 1 % IJ SOLN
INTRAMUSCULAR | Status: DC | PRN
  Administered 2024-10-25: 2 mL

## 2024-10-25 MED ORDER — IODIXANOL 320 MG/ML IV SOLN
INTRAVENOUS | Status: DC | PRN
  Administered 2024-10-25: 35 mL via INTRAVENOUS

## 2024-10-25 NOTE — Op Note (Signed)
" ° ° °  Patient name: [name] MRN: [id number] DOB: [birth year] Sex: female  10/25/2024 Pre-operative Diagnosis: ESRD Post-operative diagnosis:  Same Surgeon:  Malvina New Procedure Performed:  1.  Ultrasound-guided access, right brachiocephalic fistula  2.  Fistulogram  3.  Peripheral balloon venoplasty (63097)  4.  Clinic evaluation [phone])     Indications: This is a 79 year old female with a right brachiocephalic fistula that infiltrated.  She is here for further evaluation  Procedure:  The patient was identified in the holding area and taken to room 8.  The patient was then placed supine on the table and prepped and draped in the usual sterile fashion.  A time out was called.  Ultrasound was used to evaluate the fistula.  The vein was patent and compressible.  A digital ultrasound image was acquired.  The fistula was then accessed under ultrasound guidance using a micropuncture needle.  An 018 wire was then asvanced without resistance and a micropuncture sheath was placed.  Contrast injections were then performed through the sheath.  Findings: The central venous system was widely patent.  The arteriovenous anastomosis was widely patent.  In the midportion of the fistula there was a slight change in caliber with approximately 40 to 50% narrowing.   Intervention: After the above images were acquired the decision made to proceed with intervention.  Over a Glidewire advantage, a 6 French sheath was placed.  Balloon venoplasty was performed using an 8 x 80 balloon.  Completion imaging showed residual stenosis less than 20%.  Catheters and wires were removed.  The sheath was removed and a Monocryl was used for closure.  Impression:  #1  Successful balloon venoplasty of a mid fistula narrowing with an 8 mm balloon  #2  Fistula is ready for immediate use and is amenable to future percutaneous interventions.   ALONSO Malvina New, M.D., [facility] [place] Office:  [phone] Pager:  [phone]  "

## 2024-10-25 NOTE — H&P (Signed)
 "                                    [facility] [place]  Patient name: [name] MRN: [id number] DOB: [birth year] Sex: female   REASON FOR VISIT:    Dialysis access  HISOTRY OF PRESENT ILLNESS:    [name] is a 79 y.o. female who is status post right brachiocephalic fistula on 12/21/2023.  She underwent revision of this fistula via branch ligation and elevation on 02/01/2024.  At the time of her surgery she had an excellent 6 mm vein.  She had been using her fistula without trouble until she had an infiltrate with significant arm swelling.  Since that time they have been worried about her fistula.  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,  small   Anxiety    Arthritis    Cataract    bilateral - surgery   CKD (chronic kidney disease) stage 5, GFR less than 15 ml/min (HCC)    f/b Dr Dennise w/ CKA   Colon polyps    Depression    Diabetes mellitus 1998   type 2   Diverticulosis    Emphysema of lung (HCC)    no one has evry told me that   ESOPHAGEAL STRICTURE 08/27/2008   Family history of adverse reaction to anesthesia    daughter n/v   GERD (gastroesophageal reflux disease)    Glaucoma    bilateral   Heart failure (HCC) 01/04/2024   Hemorrhoids    hx   Hiatal hernia    Hyperlipidemia    Hypertension 2000   Migraines    Neuropathy    fingers and toes   Peripheral vascular disease    Phlebitis    30 years ago  left leg   Stroke [facility])    multiple mini strokes ( brain aneurysm ).  Right side weaker. 2 strokes     FAMILY HISTORY:   Family History  Problem Relation Age of Onset   CAD Mother 72       Died of MI   Hypertension Mother    Heart attack Mother    Heart disease Mother    CAD Father 56       Died of MI   Heart disease Father    Heart attack Father     CAD Brother 64       Two brothers died of MI   Heart attack Brother    Heart disease Brother        Amputation   Diabetes Sister    Hypertension Sister    Heart attack Brother    Heart disease Brother    Stroke Sister    Colon cancer Neg Hx    Stomach cancer Neg Hx    Esophageal cancer Neg Hx     SOCIAL HISTORY:   Social History   Tobacco Use   Smoking status: Every Day    Current packs/day: 0.25    Average packs/day: 0.4 packs/day for 48.9 years (20.2 ttl pk-yrs)    Types: Cigarettes    Start date: 11/30/2023    Passive exposure: Never   Smokeless tobacco: Never   Tobacco comments:  11/18/20 in process of quiting    03/31/23 5 cigarettes a day   Substance Use Topics   Alcohol  use: Never     ALLERGIES:   Allergies[1]   CURRENT MEDICATIONS:   No current facility-administered medications for this encounter.    REVIEW OF SYSTEMS:   [X]  denotes positive finding, [ ]  denotes negative finding Cardiac  Comments:  Chest pain or chest pressure:    Shortness of breath upon exertion:    Short of breath when lying flat:    Irregular heart rhythm:        Vascular    Pain in calf, thigh, or hip brought on by ambulation:    Pain in feet at night that wakes you up from your sleep:     Blood clot in your veins:    Leg swelling:         Pulmonary    Oxygen at home:    Productive cough:     Wheezing:         Neurologic    Sudden weakness in arms or legs:     Sudden numbness in arms or legs:     Sudden onset of difficulty speaking or slurred speech:    Temporary loss of vision in one eye:     Problems with dizziness:         Gastrointestinal    Blood in stool:     Vomited blood:         Genitourinary    Burning when urinating:     Blood in urine:        Psychiatric    Major depression:         Hematologic    Bleeding problems:    Problems with blood clotting too easily:        Skin    Rashes or ulcers:        Constitutional    Fever or chills:       PHYSICAL EXAM:   There were no vitals filed for this visit.  GENERAL: The patient is a well-nourished female, in no acute distress. The vital signs are documented above. CARDIAC: There is a regular rate and rhythm.  VASCULAR: Fistula appears to be with a good thrill.  No residual ecchymosis.  No ulceration PULMONARY: Non-labored respirations ABDOMEN: Soft and non-tender with normal pitched bowel sounds.   STUDIES:     MEDICAL ISSUES:   ESRD: The patient had an infiltrate with dialysis.  There have been concerns about her fistula since that time.  Therefore I think it is reasonable to proceed with a fistulogram and intervention as indicated.  This was discussed with the patient.  All questions were answered and she wants to proceed.    Malvina New, IV, MD, FACS [facility] [phone] Pager [phone]      [1]  Allergies Allergen Reactions   Ciprofloxacin  Swelling   Ozempic  (0.25 Or 0.5 Mg-Dose) [Semaglutide (0.25 Or 0.5mg -Dos)] Nausea And Vomiting   Plavix [Clopidogrel Bisulfate] Palpitations   Amoxicillin Itching and Swelling    FACE & EYES SWELL   Ace Inhibitors Cough    Sore throat   Atorvastatin      myalgias   Lyrica [Pregabalin] Itching and Nausea Only     gain weight   Penicillins Other (See Comments)    Pt unsure if there is an allergic reaction    "

## 2024-10-26 ENCOUNTER — Encounter (HOSPITAL_COMMUNITY): Payer: Self-pay | Admitting: Surgery

## 2024-12-11 ENCOUNTER — Ambulatory Visit

## 2024-12-11 ENCOUNTER — Ambulatory Visit (HOSPITAL_COMMUNITY)

## 2025-01-15 ENCOUNTER — Ambulatory Visit: Admitting: Diagnostic Neuroimaging
